# Patient Record
Sex: Female | Born: 1945
Health system: Southern US, Community
[De-identification: ages and names within clinical notes are randomized; demographics above are authoritative.]

## PROBLEM LIST (undated history)

## (undated) DIAGNOSIS — J189 Pneumonia, unspecified organism: Secondary | ICD-10-CM

## (undated) DIAGNOSIS — N12 Tubulo-interstitial nephritis, not specified as acute or chronic: Secondary | ICD-10-CM

## (undated) DIAGNOSIS — I639 Cerebral infarction, unspecified: Secondary | ICD-10-CM

## (undated) DIAGNOSIS — I69354 Hemiplegia and hemiparesis following cerebral infarction affecting left non-dominant side: Secondary | ICD-10-CM

## (undated) DIAGNOSIS — K219 Gastro-esophageal reflux disease without esophagitis: Secondary | ICD-10-CM

## (undated) DIAGNOSIS — F32A Depression, unspecified: Secondary | ICD-10-CM

## (undated) DIAGNOSIS — R569 Unspecified convulsions: Secondary | ICD-10-CM

## (undated) DIAGNOSIS — F17200 Nicotine dependence, unspecified, uncomplicated: Secondary | ICD-10-CM

## (undated) DIAGNOSIS — R519 Headache, unspecified: Secondary | ICD-10-CM

## (undated) DIAGNOSIS — Z720 Tobacco use: Secondary | ICD-10-CM

## (undated) DIAGNOSIS — E119 Type 2 diabetes mellitus without complications: Secondary | ICD-10-CM

## (undated) DIAGNOSIS — Z993 Dependence on wheelchair: Secondary | ICD-10-CM

## (undated) DIAGNOSIS — F0390 Unspecified dementia without behavioral disturbance: Secondary | ICD-10-CM

## (undated) DIAGNOSIS — H905 Unspecified sensorineural hearing loss: Secondary | ICD-10-CM

## (undated) DIAGNOSIS — N390 Urinary tract infection, site not specified: Secondary | ICD-10-CM

## (undated) DIAGNOSIS — I739 Peripheral vascular disease, unspecified: Secondary | ICD-10-CM

## (undated) DIAGNOSIS — J449 Chronic obstructive pulmonary disease, unspecified: Secondary | ICD-10-CM

## (undated) DIAGNOSIS — R918 Other nonspecific abnormal finding of lung field: Secondary | ICD-10-CM

## (undated) DIAGNOSIS — R269 Unspecified abnormalities of gait and mobility: Secondary | ICD-10-CM

## (undated) DIAGNOSIS — I10 Essential (primary) hypertension: Secondary | ICD-10-CM

## (undated) DIAGNOSIS — M1990 Unspecified osteoarthritis, unspecified site: Secondary | ICD-10-CM

## (undated) HISTORY — DX: Chronic obstructive pulmonary disease, unspecified: J44.9

## (undated) HISTORY — PX: TUBAL LIGATION: SHX77

## (undated) HISTORY — DX: Hemiplegia and hemiparesis following cerebral infarction affecting left non-dominant side: I69.354

## (undated) HISTORY — DX: Nicotine dependence, unspecified, uncomplicated: F17.200

---

## 2017-06-25 ENCOUNTER — Encounter (HOSPITAL_COMMUNITY): Payer: Self-pay | Admitting: Emergency Medicine

## 2017-06-25 ENCOUNTER — Emergency Department (HOSPITAL_COMMUNITY)
Admission: EM | Admit: 2017-06-25 | Discharge: 2017-06-25 | Disposition: A | Discharge status: Home / Self Care | Payer: Medicare Other | Attending: Emergency Medicine | Admitting: Emergency Medicine

## 2017-06-25 ENCOUNTER — Other Ambulatory Visit: Payer: Self-pay

## 2017-06-25 DIAGNOSIS — M545 Low back pain: Secondary | ICD-10-CM | POA: Diagnosis present

## 2017-06-25 DIAGNOSIS — Z5321 Procedure and treatment not carried out due to patient leaving prior to being seen by health care provider: Secondary | ICD-10-CM | POA: Diagnosis not present

## 2017-06-25 DIAGNOSIS — R350 Frequency of micturition: Secondary | ICD-10-CM | POA: Diagnosis not present

## 2017-06-25 HISTORY — DX: Unspecified osteoarthritis, unspecified site: M19.90

## 2017-06-25 HISTORY — DX: Cerebral infarction, unspecified: I63.9

## 2017-06-25 HISTORY — DX: Tubulo-interstitial nephritis, not specified as acute or chronic: N12

## 2017-06-25 HISTORY — DX: Urinary tract infection, site not specified: N39.0

## 2017-06-25 HISTORY — DX: Type 2 diabetes mellitus without complications: E11.9

## 2017-06-25 LAB — BASIC METABOLIC PANEL
ANION GAP: 9 (ref 5–15)
BUN: 13 mg/dL (ref 6–20)
CHLORIDE: 97 mmol/L — AB (ref 101–111)
CO2: 29 mmol/L (ref 22–32)
Calcium: 8.9 mg/dL (ref 8.9–10.3)
Creatinine, Ser: 0.74 mg/dL (ref 0.44–1.00)
GFR calc Af Amer: >60 mL/min (ref >60)
GLUCOSE: 337 mg/dL — AB (ref 65–99)
POTASSIUM: 3.7 mmol/L (ref 3.5–5.1)
Sodium: 135 mmol/L (ref 135–145)

## 2017-06-25 LAB — CBC
HEMATOCRIT: 45 % (ref 36.0–46.0)
HEMOGLOBIN: 14.7 g/dL (ref 12.0–15.0)
MCH: 26.2 pg (ref 26.0–34.0)
MCHC: 32.7 g/dL (ref 30.0–36.0)
MCV: 80.2 fL (ref 78.0–100.0)
Platelets: 229 10*3/uL (ref 150–400)
RBC: 5.61 MIL/uL — ABNORMAL HIGH (ref 3.87–5.11)
RDW: 16.6 % — AB (ref 11.5–15.5)
WBC: 8.8 10*3/uL (ref 4.0–10.5)

## 2017-06-25 NOTE — ED Triage Notes (Addendum)
Pt c/o low back pain, for 1 week-- denies injury-- has had same pain in past-- states was a kidney infection-- c/o urinary frequency Pt recently moved here from [place] - no local dr

## 2017-06-25 NOTE — ED Notes (Signed)
Pt taken to restroom by family member

## 2017-06-25 NOTE — ED Notes (Signed)
No answer X3 for recheck.

## 2017-06-25 NOTE — ED Notes (Signed)
No answer for vital signs recheck

## 2017-06-25 NOTE — ED Notes (Signed)
[facility] staff unable to locate pt to recheck vitals.

## 2017-07-27 DIAGNOSIS — F17200 Nicotine dependence, unspecified, uncomplicated: Secondary | ICD-10-CM

## 2017-07-27 HISTORY — DX: Nicotine dependence, unspecified, uncomplicated: F17.200

## 2017-07-29 DIAGNOSIS — G8194 Hemiplegia, unspecified affecting left nondominant side: Secondary | ICD-10-CM | POA: Insufficient documentation

## 2017-07-29 DIAGNOSIS — E785 Hyperlipidemia, unspecified: Secondary | ICD-10-CM | POA: Insufficient documentation

## 2017-07-29 DIAGNOSIS — M5430 Sciatica, unspecified side: Secondary | ICD-10-CM | POA: Insufficient documentation

## 2017-07-29 DIAGNOSIS — K219 Gastro-esophageal reflux disease without esophagitis: Secondary | ICD-10-CM | POA: Insufficient documentation

## 2017-07-29 DIAGNOSIS — M1990 Unspecified osteoarthritis, unspecified site: Secondary | ICD-10-CM | POA: Insufficient documentation

## 2017-07-29 DIAGNOSIS — E559 Vitamin D deficiency, unspecified: Secondary | ICD-10-CM | POA: Insufficient documentation

## 2017-07-29 DIAGNOSIS — J45909 Unspecified asthma, uncomplicated: Secondary | ICD-10-CM | POA: Insufficient documentation

## 2018-01-12 ENCOUNTER — Ambulatory Visit (INDEPENDENT_AMBULATORY_CARE_PROVIDER_SITE_OTHER): Payer: Medicare Other | Admitting: Pulmonary Disease

## 2018-01-12 ENCOUNTER — Ambulatory Visit (INDEPENDENT_AMBULATORY_CARE_PROVIDER_SITE_OTHER)
Admission: RE | Admit: 2018-01-12 | Discharge: 2018-01-12 | Disposition: A | Discharge status: Home / Self Care | Payer: Medicare Other | Source: Ambulatory Visit | Attending: Pulmonary Disease | Admitting: Pulmonary Disease

## 2018-01-12 ENCOUNTER — Encounter: Payer: Self-pay | Admitting: Pulmonary Disease

## 2018-01-12 VITALS — BP 118/70 | HR 76 | Ht 63.0 in | Wt 127.8 lb

## 2018-01-12 DIAGNOSIS — Z720 Tobacco use: Secondary | ICD-10-CM

## 2018-01-12 DIAGNOSIS — J449 Chronic obstructive pulmonary disease, unspecified: Secondary | ICD-10-CM | POA: Insufficient documentation

## 2018-01-12 DIAGNOSIS — J432 Centrilobular emphysema: Secondary | ICD-10-CM

## 2018-01-12 DIAGNOSIS — J439 Emphysema, unspecified: Secondary | ICD-10-CM | POA: Diagnosis not present

## 2018-01-12 MED ORDER — FLUTICASONE-UMECLIDIN-VILANT 100-62.5-25 MCG/INH IN AEPB: 1.0000 | INHALATION_SPRAY | Freq: Every day | RESPIRATORY_TRACT | 6 refills | Status: DC

## 2018-01-12 MED ORDER — ALBUTEROL SULFATE HFA 108 (90 BASE) MCG/ACT IN AERS: 2.0000 | INHALATION_SPRAY | Freq: Four times a day (QID) | RESPIRATORY_TRACT | 6 refills | Status: DC | PRN

## 2018-01-12 NOTE — Assessment & Plan Note (Signed)
Smoking cessation was  emphasized.  She did not seem enthusiastic about setting a quit date. They will call the quit line

## 2018-01-12 NOTE — Assessment & Plan Note (Signed)
Chest x-ray today. Refills on trilogy once daily  and Pro Air 2 puffs every 6 hours as needed.  Referral to pulmonary rehab. We will send in request for oxygen 2 L and portable concentrator

## 2018-01-12 NOTE — Patient Instructions (Signed)
Chest x-ray today. Refills on trilogy once daily  and Pro Air 2 puffs every 6 hours as needed.  Referral to pulmonary rehab. We will send in request for oxygen 2 L and portable concentrator

## 2018-01-12 NOTE — Progress Notes (Signed)
Subjective:    Patient ID: Crystal Moore, female    DOB: January 06, 1946, 72 y.o.   MRN: [id number]  HPI  Chief Complaint  Patient presents with  . Pulm Consult    Referred by Dr. Fara Olden for chronic resp failure. Per patient's daughter, she moved her a year ago. Had been on O2 for 10 years. Has not been on O2 for the past year due to not being able to find a DME.    72 year old smoker presents to establish care for emphysema and chronic respiratory failure. She is accompanied by her daughter Crystal Moore, they have just moved to [place] from [place] about a year ago.  She has a history of multiple strokes and residual left hemiparesis and insulin requiring diabetes.  Apparently she was on oxygen and was diagnosed with COPD many years ago.  When she moved over, she returned her oxygen tanks and has been without oxygen for about a year. Daughter reports that her when her oxygen level drops really low she becomes very quiet and somnolent.  She had silent seizures in the past last such was 7 years ago and she is not on anticonvulsants anymore. She continues to smoke about a pack per day, more than 50 pack years. Oxygen saturation was 87% on room air today and improved with oxygen to 93%.  I note that oxygen saturation on her PCP visit on 12/24/2017 was 93% on room air.  She is maintained on a regimen of Trelegy and pro-air to use as needed.  She had a nebulizer but has not needed this within the past year.  She reports a chronic cough productive of clear white sputum, denies wheezing.  She is ambulatory around the house, goes out of the house to smoke.  Uses a wheelchair when she goes for longer distances. Has some memory issues but not dementia per her daughter, daughter does not allow her to cook at home  Labs were reviewed which show uncontrolled diabetes, hemoglobin of 14.8 normal renal function  Significant tests/ events reviewed  Spirometry showed ratio 63, FEV1 of 0.95, 55% and FVC  of 68% consistent with moderate airway obstruction  Past Medical History:  Diagnosis Date  . Arthritis   . COPD (chronic obstructive pulmonary disease) ([place])   . Diabetes mellitus without complication ([place])   . Hemiparesis affecting left side as late effect of cerebrovascular accident (CVA) ([place])   . Pyelonephritis   . Stroke ([place])   . Tobacco use disorder   . UTI (urinary tract infection)      Past Surgical History:  Procedure Laterality Date  . TUBAL LIGATION      No Known Allergies   Social History   Socioeconomic History  . Marital status: Single    Spouse name: Not on file  . Number of children: Not on file  . Years of education: Not on file  . Highest education level: Not on file  Occupational History  . Not on file  Social Needs  . Financial resource strain: Not on file  . Food insecurity:    Worry: Not on file    Inability: Not on file  . Transportation needs:    Medical: Not on file    Non-medical: Not on file  Tobacco Use  . Smoking status: Current Every Day Smoker    Packs/day: 0.50    Types: Cigarettes  . Smokeless tobacco: Never Used  Substance and Sexual Activity  . Alcohol use: No    Frequency: Never  .  Drug use: No  . Sexual activity: Not on file  Lifestyle  . Physical activity:    Days per week: Not on file    Minutes per session: Not on file  . Stress: Not on file  Relationships  . Social connections:    Talks on phone: Not on file    Gets together: Not on file    Attends religious service: Not on file    Active member of club or organization: Not on file    Attends meetings of clubs or organizations: Not on file    Relationship status: Not on file  . Intimate partner violence:    Fear of current or ex partner: Not on file    Emotionally abused: Not on file    Physically abused: Not on file    Forced sexual activity: Not on file  Other Topics Concern  . Not on file  Social History Narrative  . Not on file    Her parents are  deceased in their 50s, Crystal Moore daughter has hypertension and son has diabetes      Review of Systems    Constitutional: negative for anorexia, fevers and sweats  Eyes: negative for irritation, redness and visual disturbance  Ears, nose, mouth, throat, and face: negative for earaches, epistaxis, nasal congestion and sore throat   Cardiovascular: negative for chest pain, lower extremity edema, orthopnea, palpitations and syncope  Gastrointestinal: negative for abdominal pain, constipation, diarrhea, melena, nausea and vomiting  Genitourinary:negative for dysuria, frequency and hematuria  Hematologic/lymphatic: negative for bleeding, easy bruising and lymphadenopathy  Musculoskeletal:negative for arthralgias, muscle weakness and stiff joints  Neurological: negative for headaches and weakness  Endocrine: negative for diabetic symptoms including polydipsia, polyuria and weight loss     Objective:   Physical Exam  Gen. Pleasant, thin, in no distress, normal affect ENT - no lesions, no post nasal drip Neck: No JVD, no thyromegaly, no carotid bruits Lungs: no use of accessory muscles, no dullness to percussion, decreased without rales or rhonchi  Cardiovascular: Rhythm regular, heart sounds  normal, no murmurs or gallops, no peripheral edema Abdomen: soft and non-tender, no hepatosplenomegaly, BS normal. Musculoskeletal: No deformities, no cyanosis or clubbing Neuro:  alert, left hemiparesis       Assessment & Plan:

## 2018-01-13 ENCOUNTER — Telehealth (HOSPITAL_COMMUNITY): Payer: Self-pay | Admitting: *Deleted

## 2018-01-13 ENCOUNTER — Telehealth: Payer: Self-pay | Admitting: Pulmonary Disease

## 2018-01-13 DIAGNOSIS — J432 Centrilobular emphysema: Secondary | ICD-10-CM

## 2018-01-13 DIAGNOSIS — H9190 Unspecified hearing loss, unspecified ear: Secondary | ICD-10-CM

## 2018-01-13 HISTORY — DX: Unspecified hearing loss, unspecified ear: H91.90

## 2018-01-13 NOTE — Telephone Encounter (Signed)
Called and spoke to [place] with [facility].  Corene Cornea stated that per [facility] policy for medicare, insurance has to pay 13 our less payments to current DME company in order to switch DME companies. Per Huey Bienenstock has paid 18 payments to [employer] home [facility]. Corene Cornea stated that he is currently working with upper management to see if pt can be taken on. Corene Cornea stated he will call back with updates.    Will send to Dr. Elsworth Soho, as an Juluis Rainier.

## 2018-01-14 ENCOUNTER — Other Ambulatory Visit: Payer: Self-pay | Admitting: Pulmonary Disease

## 2018-01-14 DIAGNOSIS — J439 Emphysema, unspecified: Secondary | ICD-10-CM

## 2018-01-14 NOTE — Telephone Encounter (Signed)
Placed a new order stating to change pt's DME from [facility] to a different one.  Nothing further needed.

## 2018-01-14 NOTE — Telephone Encounter (Signed)
Please send to different DME

## 2018-01-20 ENCOUNTER — Telehealth (HOSPITAL_COMMUNITY): Payer: Self-pay

## 2018-01-20 NOTE — Telephone Encounter (Signed)
Attempted to call daughter of patient in regards to Pulmonary Rehab - Could not leave voicemail.

## 2018-01-21 ENCOUNTER — Telehealth: Payer: Self-pay | Admitting: Pulmonary Disease

## 2018-01-21 DIAGNOSIS — J439 Emphysema, unspecified: Secondary | ICD-10-CM

## 2018-01-21 NOTE — Telephone Encounter (Signed)
Crystal Moore [facility] requesting O2 order be placed again without a DME specified Order placed Nothing further needed; will sign off

## 2018-01-24 ENCOUNTER — Inpatient Hospital Stay: Admission: RE | Admit: 2018-01-24 | Payer: Medicare Other | Source: Ambulatory Visit

## 2018-02-07 ENCOUNTER — Inpatient Hospital Stay: Admission: RE | Admit: 2018-02-07 | Payer: Medicare Other | Source: Ambulatory Visit

## 2018-02-09 ENCOUNTER — Ambulatory Visit: Payer: Medicare Other | Admitting: *Deleted

## 2018-02-11 ENCOUNTER — Ambulatory Visit: Payer: Medicare Other | Admitting: Adult Health

## 2018-02-15 ENCOUNTER — Ambulatory Visit (INDEPENDENT_AMBULATORY_CARE_PROVIDER_SITE_OTHER)
Admission: RE | Admit: 2018-02-15 | Discharge: 2018-02-15 | Disposition: A | Discharge status: Home / Self Care | Payer: Medicare Other | Source: Ambulatory Visit | Attending: Pulmonary Disease | Admitting: Pulmonary Disease

## 2018-02-15 DIAGNOSIS — J439 Emphysema, unspecified: Secondary | ICD-10-CM | POA: Diagnosis not present

## 2018-02-18 ENCOUNTER — Encounter: Payer: Self-pay | Admitting: Pulmonary Disease

## 2018-02-18 ENCOUNTER — Ambulatory Visit (INDEPENDENT_AMBULATORY_CARE_PROVIDER_SITE_OTHER): Payer: Medicare Other | Admitting: Pulmonary Disease

## 2018-02-18 DIAGNOSIS — J9611 Chronic respiratory failure with hypoxia: Secondary | ICD-10-CM

## 2018-02-18 DIAGNOSIS — J432 Centrilobular emphysema: Secondary | ICD-10-CM | POA: Diagnosis not present

## 2018-02-18 DIAGNOSIS — Z720 Tobacco use: Secondary | ICD-10-CM

## 2018-02-18 DIAGNOSIS — J849 Interstitial pulmonary disease, unspecified: Secondary | ICD-10-CM | POA: Diagnosis not present

## 2018-02-18 MED ORDER — NICOTINE 10 MG IN INHA: 1.0000 | RESPIRATORY_TRACT | 0 refills | Status: DC | PRN

## 2018-02-18 NOTE — Progress Notes (Signed)
   Subjective:    Patient ID: Crystal Moore, female    DOB: 04-20-1946, 72 y.o.   MRN: [id number]  HPI  72 year old smoker  for FU of  emphysema and chronic respiratory failure.  She has a history of multiple strokes and residual left hemiparesis and uncontrolled insulin requiring diabetes.  h/o seizure disorder last such was 7 years ago and she is not on anticonvulsants anymore. She continues to smoke about a pack per day, more than 50 pack years.  Accompanied by grandson today, she uses oxygen on and off at home, does not have portable oxygen.  She continues to smoke, outside the house and grandson is worried about oxygen and smoking.  She also has some sputum production which she swallows mostly.  Significant tests/ events reviewed  12/2017 Spirometry showed ratio 63, FEV1 of 0.95, 55% and FVC of 68% consistent with moderate airway obstruction  01/12/18  87% RA and improved with oxygen to 93%.  CT chest 01/2018 >> fibrotic NSIP ,less likely UIP , mild emphysema   Past Medical History:  Diagnosis Date  . Arthritis   . COPD (chronic obstructive pulmonary disease) ([place])   . Diabetes mellitus without complication ([place])   . Hemiparesis affecting left side as late effect of cerebrovascular accident (CVA) ([place])   . Pyelonephritis   . Stroke ([place])   . Tobacco use disorder   . UTI (urinary tract infection)     Review of Systems neg for any significant sore throat, dysphagia, itching, sneezing, nasal congestion or excess/ purulent secretions, fever, chills, sweats, unintended wt loss, pleuritic or exertional cp, hempoptysis, orthopnea pnd or change in chronic leg swelling. Also denies presyncope, palpitations, heartburn, abdominal pain, nausea, vomiting, diarrhea or change in bowel or urinary habits, dysuria,hematuria, rash, arthralgias, visual complaints, headache, numbness weakness or ataxia.     Objective:   Physical Exam   Gen. Pleasant, elderly, well-nourished, in no  distress ENT - no thrush, no post nasal drip, edentulous Neck: No JVD, no thyromegaly, no carotid bruits Lungs: no use of accessory muscles, no dullness to percussion, bibasal rales no rhonchi  Cardiovascular: Rhythm regular, heart sounds  normal, no murmurs or gallops, no peripheral edema Musculoskeletal: No deformities, no cyanosis or clubbing         Assessment & Plan:

## 2018-02-18 NOTE — Assessment & Plan Note (Signed)
Ct trelegy Use Mucinex 1 tablet daily as needed for phlegm

## 2018-02-18 NOTE — Addendum Note (Signed)
Addended by: Valerie Salts on: 02/18/2018 04:12 PM   Modules accepted: Orders

## 2018-02-18 NOTE — Patient Instructions (Signed)
You have emphysema and scar tissue buildup in your lungs/fibrosis  Main message today is that you have to quit smoking! We will provide you prescription for Nicotrol inhaler to use once daily to help cut down on cigarettes #20  Use Mucinex 1 tablet daily as needed for phlegm

## 2018-02-18 NOTE — Assessment & Plan Note (Signed)
have to quit smoking! We will provide you prescription for Nicotrol inhaler to use once daily to help cut down on cigarettes #20

## 2018-02-18 NOTE — Assessment & Plan Note (Signed)
Doubt that she is a candidate for anti-fibrotic therapies Given smoking cessation will be the most important intervention at this time

## 2018-02-18 NOTE — Assessment & Plan Note (Signed)
Continue oxygen concentrator at home. But avoid portable oxygen until she is able to quit smoking

## 2018-03-16 ENCOUNTER — Encounter: Payer: Self-pay | Admitting: Endocrinology

## 2018-03-16 ENCOUNTER — Ambulatory Visit (INDEPENDENT_AMBULATORY_CARE_PROVIDER_SITE_OTHER): Payer: Medicare Other | Admitting: Endocrinology

## 2018-03-16 VITALS — BP 178/98 | HR 87 | Temp 97.9°F | Ht 63.0 in | Wt 133.0 lb

## 2018-03-16 DIAGNOSIS — E119 Type 2 diabetes mellitus without complications: Secondary | ICD-10-CM | POA: Insufficient documentation

## 2018-03-16 DIAGNOSIS — Z794 Long term (current) use of insulin: Secondary | ICD-10-CM

## 2018-03-16 DIAGNOSIS — E1159 Type 2 diabetes mellitus with other circulatory complications: Secondary | ICD-10-CM

## 2018-03-16 LAB — POCT GLYCOSYLATED HEMOGLOBIN (HGB A1C): Hemoglobin A1C: 11 % — AB (ref 4.0–5.6)

## 2018-03-16 MED ORDER — INSULIN DEGLUDEC 100 UNIT/ML ~~LOC~~ SOPN: 50.0000 [IU] | PEN_INJECTOR | Freq: Every day | SUBCUTANEOUS | 11 refills | Status: DC

## 2018-03-16 NOTE — Progress Notes (Signed)
   Subjective:    Patient ID: Crystal Moore, female    DOB: [birth year], 72 y.o.   MRN: [id number]  HPI    Review of Systems     Objective:   Physical Exam        Assessment & Plan:

## 2018-03-16 NOTE — Progress Notes (Signed)
Subjective:    Patient ID: Crystal Moore, female    DOB: Jun 08, 1946, 72 y.o.   MRN: [id number]  HPI pt is referred by Crystal Moore, for diabetes.  Pt states DM was dx'ed in 2002; she has mild if any neuropathy of the lower extremities, but he has associated CVA's; she has been on insulin since soon after dx; pt says her diet is poor, but exercise is limited by health problems; she has never had GDM, pancreatitis, pancreatic surgery, severe hypoglycemia or DKA.  dtr gives pt her insulin, but this is sometimes misses, due to schedule.  History is from grandson (Crystal Moore), due to pt's memory loss.  Pt has a meter and strips.  Past Medical History:  Diagnosis Date  . Arthritis   . COPD (chronic obstructive pulmonary disease) ([place])   . Diabetes mellitus without complication ([place])   . Hemiparesis affecting left side as late effect of cerebrovascular accident (CVA) ([place])   . Pyelonephritis   . Stroke ([place])   . Tobacco use disorder   . UTI (urinary tract infection)     Past Surgical History:  Procedure Laterality Date  . TUBAL LIGATION      Social History   Socioeconomic History  . Marital status: Single    Spouse name: Not on file  . Number of children: Not on file  . Years of education: Not on file  . Highest education level: Not on file  Occupational History  . Not on file  Social Needs  . Financial resource strain: Not on file  . Food insecurity:    Worry: Not on file    Inability: Not on file  . Transportation needs:    Medical: Not on file    Non-medical: Not on file  Tobacco Use  . Smoking status: Current Every Day Smoker    Packs/day: 0.50    Types: Cigarettes  . Smokeless tobacco: Never Used  Substance and Sexual Activity  . Alcohol use: No    Frequency: Never  . Drug use: No  . Sexual activity: Not on file  Lifestyle  . Physical activity:    Days per week: Not on file    Minutes per session: Not on file  . Stress: Not on file  Relationships  . Social  connections:    Talks on phone: Not on file    Gets together: Not on file    Attends religious service: Not on file    Active member of club or organization: Not on file    Attends meetings of clubs or organizations: Not on file    Relationship status: Not on file  . Intimate partner violence:    Fear of current or ex partner: Not on file    Emotionally abused: Not on file    Physically abused: Not on file    Forced sexual activity: Not on file  Other Topics Concern  . Not on file  Social History Narrative  . Not on file    Current Outpatient Medications on File Prior to Visit  Medication Sig Dispense Refill  . albuterol (PROVENTIL HFA;VENTOLIN HFA) 108 (90 Base) MCG/ACT inhaler Inhale 2 puffs into the lungs every 6 (six) hours as needed for wheezing or shortness of breath. 1 Inhaler 6  . amLODipine-benazepril (LOTREL) 5-20 MG capsule Take 1 capsule by mouth daily.    Marland Kitchen aspirin EC 81 MG tablet Take 81 mg by mouth daily.    . Fluticasone-Umeclidin-Vilant (TRELEGY ELLIPTA) 100-62.5-25 MCG/INH AEPB Inhale 1  puff into the lungs daily. 1 each 6  . glipiZIDE (GLUCOTROL) 5 MG tablet Take by mouth daily before breakfast.    . metFORMIN (GLUCOPHAGE) 500 MG tablet Take by mouth 2 (two) times daily with a meal.    . naproxen sodium (ALEVE) 220 MG tablet Take 220 mg by mouth as needed.    . nicotine (NICOTROL) 10 MG inhaler Inhale 1 Cartridge (1 continuous puffing total) into the lungs as needed for smoking cessation. 30 each 0  . PARoxetine (PAXIL) 10 MG tablet Take 10 mg by mouth daily.     No current facility-administered medications on file prior to visit.     No Known Allergies  Family History  Problem Relation Age of Onset  . Diabetes Mother   . Diabetes Sister   . Diabetes Brother     BP (!) 178/98 (BP Location: Right Arm, Patient Position: Sitting, Cuff Size: Normal)   Pulse 87   Temp 97.9 F (36.6 C) (Oral)   Ht 5\' 3"  (1.6 m)   Wt 133 lb (60.3 kg)   SpO2 93%   BMI 23.56  kg/m    Review of Systems denies blurry vision, headache, chest pain, sob, n/v, muscle cramps, excessive diaphoresis, depression, hypoglycemia, cold intolerance, rhinorrhea, and easy bruising.  Pt has lost weight.  She has urinary frequency and memory loss.      Objective:   Physical Exam VS: see vs page GEN: no distress.  In wheelchair HEAD: head: no deformity eyes: no periorbital swelling, no proptosis external nose and ears are normal mouth: no lesion seen NECK: supple, thyroid is not enlarged CHEST WALL: no deformity LUNGS: clear to auscultation CV: reg rate and rhythm, no murmur ABD: abdomen is soft, nontender.  no hepatosplenomegaly.  not distended.  no hernia MUSCULOSKELETAL: muscle bulk and strength are grossly normal, except severely decreased on the LUE and LLE.  no obvious joint swelling.  gait is not tested.  EXTEMITIES: no deformity.  no ulcer on the feet.  feet are of normal color and temp.  no edema PULSES: dorsalis pedis intact bilat.  no carotid bruit NEURO:  cn 2-12 grossly intact.   readily moves all 4's.  sensation is intact to touch on the feet SKIN:  Normal texture and temperature.  No rash or suspicious lesion is visible.   NODES:  None palpable at the neck PSYCH: alert, but oriented to self and place only.  Does not appear anxious nor depressed.  Lab Results  Component Value Date   CREATININE 0.74 06/25/2017   BUN 13 06/25/2017   NA 135 06/25/2017   K 3.7 06/25/2017   CL 97 (L) 06/25/2017   CO2 29 06/25/2017    A1c=11.0%  I have reviewed outside records, and summarized: Pt was noted to have elevated a1c, and referred here.  She was also seen by pulm, and was advised to d/c smoking.      Assessment & Plan:  Insulin-requiring type 2 DM, with CVA's: severe exacerbation.  Memory loss: she is not a candidate for multiple daily injections.  In fact, she needs a qd insulin she can make up if she misses a dose.  COPD: this severely limits exercise rx.     Patient Instructions  good diet and exercise significantly improve the control of your diabetes.  please let me know if you wish to be referred to a dietician.  high blood sugar is very risky to your health.  you should see an eye doctor and dentist  every year.  It is very important to get all recommended vaccinations.  Controlling your blood pressure and cholesterol drastically reduces the damage diabetes does to your body.  Those who smoke should quit.  Please discuss these with your doctor.  check your blood sugar twice a day.  vary the time of day when you check, between before the 3 meals, and at bedtime.  also check if you have symptoms of your blood sugar being too high or too low.  please keep a record of the readings and bring it to your next appointment here (or you can bring the meter itself).  You can write it on any piece of paper.  please call us sooner if your blood sugar goes below 70, or if you have a lot of readings over 200. We will need to take this complex situation in stages For now, please: Change the insulin to "tresiba," 50 units daily.  If you miss the shot, take as soon as you remember.   Please continue the same other diabetes medications, but we'll plan to phase those out.  Please come back for a follow-up appointment in 2 weeks.

## 2018-03-16 NOTE — Patient Instructions (Signed)
good diet and exercise significantly improve the control of your diabetes.  please let me know if you wish to be referred to a dietician.  high blood sugar is very risky to your health.  you should see an eye doctor and dentist every year.  It is very important to get all recommended vaccinations.  Controlling your blood pressure and cholesterol drastically reduces the damage diabetes does to your body.  Those who smoke should quit.  Please discuss these with your doctor.  check your blood sugar twice a day.  vary the time of day when you check, between before the 3 meals, and at bedtime.  also check if you have symptoms of your blood sugar being too high or too low.  please keep a record of the readings and bring it to your next appointment here (or you can bring the meter itself).  You can write it on any piece of paper.  please call us sooner if your blood sugar goes below 70, or if you have a lot of readings over 200. We will need to take this complex situation in stages For now, please: Change the insulin to "tresiba," 50 units daily.  If you miss the shot, take as soon as you remember.   Please continue the same other diabetes medications, but we'll plan to phase those out.  Please come back for a follow-up appointment in 2 weeks.

## 2018-04-06 ENCOUNTER — Ambulatory Visit: Payer: Medicare Other | Admitting: Endocrinology

## 2018-04-06 DIAGNOSIS — Z0289 Encounter for other administrative examinations: Secondary | ICD-10-CM

## 2018-05-28 ENCOUNTER — Inpatient Hospital Stay (HOSPITAL_COMMUNITY)
Admission: EM | Admit: 2018-05-28 | Discharge: 2018-06-01 | DRG: 871 | Disposition: A | Discharge status: Home / Self Care | Payer: Medicare Other | Attending: Internal Medicine | Admitting: Internal Medicine

## 2018-05-28 ENCOUNTER — Emergency Department (HOSPITAL_COMMUNITY): Payer: Medicare Other

## 2018-05-28 DIAGNOSIS — Z79899 Other long term (current) drug therapy: Secondary | ICD-10-CM

## 2018-05-28 DIAGNOSIS — R159 Full incontinence of feces: Secondary | ICD-10-CM | POA: Diagnosis present

## 2018-05-28 DIAGNOSIS — I10 Essential (primary) hypertension: Secondary | ICD-10-CM

## 2018-05-28 DIAGNOSIS — I69354 Hemiplegia and hemiparesis following cerebral infarction affecting left non-dominant side: Secondary | ICD-10-CM

## 2018-05-28 DIAGNOSIS — K529 Noninfective gastroenteritis and colitis, unspecified: Secondary | ICD-10-CM

## 2018-05-28 DIAGNOSIS — Z7951 Long term (current) use of inhaled steroids: Secondary | ICD-10-CM

## 2018-05-28 DIAGNOSIS — A419 Sepsis, unspecified organism: Secondary | ICD-10-CM | POA: Diagnosis not present

## 2018-05-28 DIAGNOSIS — Z9981 Dependence on supplemental oxygen: Secondary | ICD-10-CM

## 2018-05-28 DIAGNOSIS — J9611 Chronic respiratory failure with hypoxia: Secondary | ICD-10-CM | POA: Diagnosis present

## 2018-05-28 DIAGNOSIS — I959 Hypotension, unspecified: Secondary | ICD-10-CM

## 2018-05-28 DIAGNOSIS — W1839XA Other fall on same level, initial encounter: Secondary | ICD-10-CM | POA: Diagnosis present

## 2018-05-28 DIAGNOSIS — M1990 Unspecified osteoarthritis, unspecified site: Secondary | ICD-10-CM | POA: Diagnosis present

## 2018-05-28 DIAGNOSIS — I7389 Other specified peripheral vascular diseases: Secondary | ICD-10-CM | POA: Diagnosis present

## 2018-05-28 DIAGNOSIS — I672 Cerebral atherosclerosis: Secondary | ICD-10-CM | POA: Diagnosis present

## 2018-05-28 DIAGNOSIS — R32 Unspecified urinary incontinence: Secondary | ICD-10-CM | POA: Diagnosis present

## 2018-05-28 DIAGNOSIS — G9340 Encephalopathy, unspecified: Secondary | ICD-10-CM

## 2018-05-28 DIAGNOSIS — K559 Vascular disorder of intestine, unspecified: Secondary | ICD-10-CM

## 2018-05-28 DIAGNOSIS — Z7982 Long term (current) use of aspirin: Secondary | ICD-10-CM

## 2018-05-28 DIAGNOSIS — F329 Major depressive disorder, single episode, unspecified: Secondary | ICD-10-CM | POA: Diagnosis present

## 2018-05-28 DIAGNOSIS — R9431 Abnormal electrocardiogram [ECG] [EKG]: Secondary | ICD-10-CM | POA: Diagnosis present

## 2018-05-28 DIAGNOSIS — F32A Depression, unspecified: Secondary | ICD-10-CM

## 2018-05-28 DIAGNOSIS — E876 Hypokalemia: Secondary | ICD-10-CM | POA: Diagnosis present

## 2018-05-28 DIAGNOSIS — G40909 Epilepsy, unspecified, not intractable, without status epilepticus: Secondary | ICD-10-CM | POA: Diagnosis present

## 2018-05-28 DIAGNOSIS — K921 Melena: Secondary | ICD-10-CM

## 2018-05-28 DIAGNOSIS — Z833 Family history of diabetes mellitus: Secondary | ICD-10-CM

## 2018-05-28 DIAGNOSIS — F17210 Nicotine dependence, cigarettes, uncomplicated: Secondary | ICD-10-CM | POA: Diagnosis present

## 2018-05-28 DIAGNOSIS — Z87440 Personal history of urinary (tract) infections: Secondary | ICD-10-CM

## 2018-05-28 DIAGNOSIS — G9349 Other encephalopathy: Secondary | ICD-10-CM | POA: Diagnosis present

## 2018-05-28 DIAGNOSIS — J449 Chronic obstructive pulmonary disease, unspecified: Secondary | ICD-10-CM

## 2018-05-28 DIAGNOSIS — J440 Chronic obstructive pulmonary disease with acute lower respiratory infection: Secondary | ICD-10-CM | POA: Diagnosis present

## 2018-05-28 DIAGNOSIS — Z6827 Body mass index (BMI) 27.0-27.9, adult: Secondary | ICD-10-CM

## 2018-05-28 DIAGNOSIS — R6520 Severe sepsis without septic shock: Secondary | ICD-10-CM | POA: Diagnosis present

## 2018-05-28 DIAGNOSIS — E119 Type 2 diabetes mellitus without complications: Secondary | ICD-10-CM

## 2018-05-28 DIAGNOSIS — E11649 Type 2 diabetes mellitus with hypoglycemia without coma: Secondary | ICD-10-CM | POA: Diagnosis present

## 2018-05-28 DIAGNOSIS — E440 Moderate protein-calorie malnutrition: Secondary | ICD-10-CM | POA: Diagnosis present

## 2018-05-28 DIAGNOSIS — Z794 Long term (current) use of insulin: Secondary | ICD-10-CM

## 2018-05-28 DIAGNOSIS — R414 Neurologic neglect syndrome: Secondary | ICD-10-CM | POA: Diagnosis present

## 2018-05-28 DIAGNOSIS — J189 Pneumonia, unspecified organism: Secondary | ICD-10-CM | POA: Diagnosis present

## 2018-05-28 DIAGNOSIS — Z23 Encounter for immunization: Secondary | ICD-10-CM

## 2018-05-28 HISTORY — DX: Unspecified convulsions: R56.9

## 2018-05-28 HISTORY — DX: Tobacco use: Z72.0

## 2018-05-28 HISTORY — DX: Cerebral infarction, unspecified: I63.9

## 2018-05-28 HISTORY — DX: Type 2 diabetes mellitus without complications: E11.9

## 2018-05-28 LAB — CBC
HEMATOCRIT: 46.3 % — AB (ref 36.0–46.0)
HEMOGLOBIN: 14.2 g/dL (ref 12.0–15.0)
MCH: 25.8 pg — ABNORMAL LOW (ref 26.0–34.0)
MCHC: 30.7 g/dL (ref 30.0–36.0)
MCV: 84 fL (ref 80.0–100.0)
Platelets: 189 10*3/uL (ref 150–400)
RBC: 5.51 MIL/uL — AB (ref 3.87–5.11)
RDW: 14.6 % (ref 11.5–15.5)
WBC: 18.2 10*3/uL — ABNORMAL HIGH (ref 4.0–10.5)
nRBC: 0 % (ref 0.0–0.2)

## 2018-05-28 LAB — I-STAT CHEM 8, ED
BUN: 12 mg/dL (ref 8–23)
CHLORIDE: 104 mmol/L (ref 98–111)
Calcium, Ion: 1.16 mmol/L (ref 1.15–1.40)
Creatinine, Ser: 0.7 mg/dL (ref 0.44–1.00)
Glucose, Bld: 278 mg/dL — ABNORMAL HIGH (ref 70–99)
HCT: 47 % — ABNORMAL HIGH (ref 36.0–46.0)
Hemoglobin: 16 g/dL — ABNORMAL HIGH (ref 12.0–15.0)
Potassium: 3.8 mmol/L (ref 3.5–5.1)
SODIUM: 140 mmol/L (ref 135–145)
TCO2: 23 mmol/L (ref 22–32)

## 2018-05-28 LAB — DIFFERENTIAL
ABS IMMATURE GRANULOCYTES: 0.11 10*3/uL — AB (ref 0.00–0.07)
BASOS PCT: 0 %
Basophils Absolute: 0.1 10*3/uL (ref 0.0–0.1)
Eosinophils Absolute: 0.2 10*3/uL (ref 0.0–0.5)
Eosinophils Relative: 1 %
Immature Granulocytes: 1 %
Lymphocytes Relative: 32 %
Lymphs Abs: 5.9 10*3/uL — ABNORMAL HIGH (ref 0.7–4.0)
MONOS PCT: 3 %
Monocytes Absolute: 0.5 10*3/uL (ref 0.1–1.0)
NEUTROS ABS: 11.4 10*3/uL — AB (ref 1.7–7.7)
NEUTROS PCT: 63 %

## 2018-05-28 LAB — PROTIME-INR
INR: 1.22
Prothrombin Time: 15.3 seconds — ABNORMAL HIGH (ref 11.4–15.2)

## 2018-05-28 LAB — CBG MONITORING, ED: GLUCOSE-CAPILLARY: 242 mg/dL — AB (ref 70–99)

## 2018-05-28 LAB — I-STAT TROPONIN, ED: TROPONIN I, POC: 0.01 ng/mL (ref 0.00–0.08)

## 2018-05-28 LAB — APTT: APTT: 23 s — AB (ref 24–36)

## 2018-05-28 LAB — POC OCCULT BLOOD, ED: Fecal Occult Bld: POSITIVE — AB

## 2018-05-28 MED ORDER — IOHEXOL 300 MG/ML  SOLN
100.0000 mL | Freq: Once | INTRAMUSCULAR | Status: AC | PRN
  Administered 2018-05-28: 100 mL via INTRAVENOUS

## 2018-05-28 MED ORDER — ONDANSETRON HCL 4 MG/2ML IJ SOLN
4.0000 mg | Freq: Once | INTRAMUSCULAR | Status: AC
  Administered 2018-05-28: 4 mg via INTRAVENOUS
  Filled 2018-05-28: qty 2

## 2018-05-28 MED ORDER — PIPERACILLIN-TAZOBACTAM 3.375 G IVPB 30 MIN
3.3750 g | Freq: Once | INTRAVENOUS | Status: AC
  Administered 2018-05-29: 3.375 g via INTRAVENOUS
  Filled 2018-05-28: qty 50

## 2018-05-28 MED ORDER — LORAZEPAM 2 MG/ML IJ SOLN
INTRAMUSCULAR | Status: AC
  Filled 2018-05-28: qty 1

## 2018-05-28 MED ORDER — IOPAMIDOL (ISOVUE-370) INJECTION 76%
50.0000 mL | Freq: Once | INTRAVENOUS | Status: AC | PRN
  Administered 2018-05-28: 50 mL via INTRAVENOUS

## 2018-05-28 MED ORDER — SODIUM CHLORIDE 0.9 % IV BOLUS
1000.0000 mL | Freq: Once | INTRAVENOUS | Status: AC
  Administered 2018-05-28: 1000 mL via INTRAVENOUS

## 2018-05-28 NOTE — ED Notes (Signed)
Family at bedside. 

## 2018-05-28 NOTE — H&P (Addendum)
History and Physical    Crystal Moore DUK:[id number] DOB: Feb 08, 1946 DOA: 05/28/2018  PCP: System, Pcp Not In Patient coming from: [place]  Chief Complaint: AMS  HPI: Crystal Moore is a 72 y.o. female with medical history significant of hypertension, type 2 diabetes, seizure, previous stroke with residual left-sided deficits presenting to the hospital for evaluation of left-sided flaccidity and altered mental status.  Code stroke was initially called in the ED but was later canceled. Per EMS patient was at [place].  She became unresponsive they sat her down, she started vomiting, was incontinent of stool and urine. In route EMS noticed that her left side was flaccid. Found to be hypotensive with blood pressure in the 50s/ 30s by EMS.  Placed on nonrebreather by EMS (unclear what her oxygen saturation was prior to arrival).   Patient awake and alert but appeared slightly confused.  She denied having any fevers, chills, chest pain, or shortness of breath.  Reported having dizziness and generalized sharp abdominal pain today.  Daughter over the phone states that the patient is  AAOx3 at baseline. Daughter was with the patient at [facility] today when all of a sudden patient felt weak and fell down.  She did not hit her head or sustain any injuries from the fall.  Another customer brought her chair and they made her sit down on it.  It was then noted that the patient's "eyes were dilated" and she was staring in space.  The entire episode lasted 20 minutes and patient was confused at that time.  Daughter noticed urinary incontinence but stated that patient does have chronic urinary incontinence.  She uses 2 L home oxygen at all times.  Daughter states that the patient takes Aleve every day.  ED Course: Afebrile.  Slightly tachycardic. CBG 242.  White count 18.2.  Hemoglobin 14.2 on CBC and 16.0 on i-STAT chemistry.  FOBT positive.  I-STAT troponin negative.  EKG with atrial flutter (heart rate 111) but poor  quality study with baseline wander in most leads.  CT head showing chronic ischemic microangiopathy, atrophy, and multiple old infarcts; negative for acute findings.  CTA head and neck showing 70% stenosis of proximal right internal carotid artery, 25% stenosis of left internal carotid origin, occlusion of nondominant left vertebral artery proximally with reconstitution (indeterminate age), diffuse intracranial atherosclerotic disease.  CTA negative for emergent large vessel occlusion.  CT abdomen pelvis with contrast showing mild opacity at the left lung base which could represent atelectasis or subtle infiltrate.  There is also wall thickening of the distal transverse colon through the descending colon with significant adjacent fat stranding concerning for colitis.  Patient was seen by neurology (Dr. Leonel Ramsay) and his recommendation is not to start antiepileptics unless she has further events that was suspected to be seizure.  He is recommending no further imaging unless her neurologic status is not at baseline per her daughter tomorrow.  If she reports a change, then an MRI could be obtained.  Patient was started on Zosyn in the ED and received 1 L bolus of normal saline. TRH paged to admit. Blood pressure currently normal after fluid resuscitation.  Review of Systems: As per HPI otherwise 10 point review of systems negative.  Past Medical History:  Diagnosis Date  . Arthritis   . COPD (chronic obstructive pulmonary disease) ([place])   . CVA (cerebral vascular accident) ([place])   . Diabetes ([place])   . Hemiparesis affecting left side as late effect of cerebrovascular accident (CVA) ([place])   .  Pyelonephritis   . Seizures ([place])   . Tobacco use   . UTI (urinary tract infection)     Past Surgical History:  Procedure Laterality Date  . TUBAL LIGATION       reports that she has been smoking cigarettes. She has a 40.00 pack-year smoking history. She does not have any smokeless tobacco history on file.  She reports that she does not drink alcohol or use drugs.  No Known Allergies  Family History  Problem Relation Age of Onset  . Diabetes Mother   . Diabetes Sister   . Diabetes Brother     Prior to Admission medications   Medication Sig Start Date End Date Taking? Authorizing Provider  amLODipine-benazepril (LOTREL) 5-20 MG capsule Take 1 capsule by mouth daily.   Yes [provider]  aspirin EC 81 MG tablet Take 81 mg by mouth daily.   Yes [provider]  Fluticasone-Umeclidin-Vilant (TRELEGY ELLIPTA) 100-62.5-25 MCG/INH AEPB Inhale 1 puff into the lungs daily.   Yes [provider]  glipiZIDE (GLUCOTROL XL) 5 MG 24 hr tablet Take 5 mg by mouth 2 (two) times daily.   Yes [provider]  Insulin Glargine (LANTUS SOLOSTAR) 100 UNIT/ML Solostar Pen Inject 50 Units into the skin daily before breakfast.   Yes [provider]  metFORMIN (GLUCOPHAGE) 500 MG tablet Take 500 mg by mouth 2 (two) times daily.   Yes [provider]  naproxen sodium (ALEVE) 220 MG tablet Take 220 mg by mouth 2 (two) times daily as needed (for pain).   Yes [provider]  PARoxetine (PAXIL) 10 MG tablet Take 10 mg by mouth daily.   Yes [provider]    Physical Exam: Vitals:   05/28/18 2300 05/28/18 2315 05/28/18 2330 05/28/18 2345  BP: (!) 147/64 (!) 128/55 (!) 155/61 (!) 145/60  Pulse: 87 88 87 87  Resp:      Temp:      TempSrc:      SpO2: 97% 97% 98% 97%  Weight:       Physical Exam  Constitutional: No distress.  Resting comfortably in a hospital stretcher  HENT:  Mouth/Throat: Oropharynx is clear and moist.  Eyes: EOM are normal. Right eye exhibits no discharge. Left eye exhibits no discharge.  Neck: Neck supple. No tracheal deviation present.  Cardiovascular: Normal rate, regular rhythm and intact distal pulses.  Pulmonary/Chest: Effort normal. No respiratory distress. She has no wheezes.  Mild rales appreciated at the  left lung base.  Breathing comfortably on 1.5 L oxygen via nasal cannula.  No increased work of breathing.  Abdominal: Soft. Bowel sounds are normal. She exhibits no distension. There is tenderness. There is no rebound and no guarding.  Generalized tenderness to palpation  Musculoskeletal: She exhibits no edema.  Neurological:  Awake, alert, oriented to person and place Tongue midline No facial droop Strength 5 out of 5 in right upper and lower extremities Strength 3 out of 5 in left upper and lower extremities  Skin: Skin is warm and dry. She is not diaphoretic.      Labs on Admission: I have personally reviewed following labs and imaging studies  CBC: Recent Labs  Lab 05/28/18 1905 05/28/18 1909  WBC 18.2*  --   NEUTROABS 11.4*  --   HGB 14.2 16.0*  HCT 46.3* 47.0*  MCV 84.0  --   PLT 189  --    Basic Metabolic Panel: Recent Labs  Lab 05/28/18 1909  NA 140  K 3.8  CL 104  GLUCOSE 278*  BUN 12  CREATININE 0.70   GFR: CrCl cannot be calculated (Unknown ideal weight.). Liver Function Tests: No results for input(s): AST, ALT, ALKPHOS, BILITOT, PROT, ALBUMIN in the last 168 hours. No results for input(s): LIPASE, AMYLASE in the last 168 hours. No results for input(s): AMMONIA in the last 168 hours. Coagulation Profile: Recent Labs  Lab 05/28/18 1905  INR 1.22   Cardiac Enzymes: No results for input(s): CKTOTAL, CKMB, CKMBINDEX, TROPONINI in the last 168 hours. BNP (last 3 results) No results for input(s): PROBNP in the last 8760 hours. HbA1C: No results for input(s): HGBA1C in the last 72 hours. CBG: Recent Labs  Lab 05/28/18 1819  GLUCAP 242*   Lipid Profile: No results for input(s): CHOL, HDL, LDLCALC, TRIG, CHOLHDL, LDLDIRECT in the last 72 hours. Thyroid Function Tests: No results for input(s): TSH, T4TOTAL, FREET4, T3FREE, THYROIDAB in the last 72 hours. Anemia Panel: No results for input(s): VITAMINB12, FOLATE, FERRITIN, TIBC, IRON, RETICCTPCT  in the last 72 hours. Urine analysis: No results found for: COLORURINE, APPEARANCEUR, LABSPEC, [place], GLUCOSEU, HGBUR, BILIRUBINUR, KETONESUR, PROTEINUR, UROBILINOGEN, NITRITE, LEUKOCYTESUR  Radiological Exams on Admission: Ct Angio Head W Or Wo Contrast  Result Date: 05/28/2018 CLINICAL DATA:  Code stroke, left-sided weakness EXAM: CT ANGIOGRAPHY HEAD AND NECK TECHNIQUE: Multidetector CT imaging of the head and neck was performed using the standard protocol during bolus administration of intravenous contrast. Multiplanar CT image reconstructions and MIPs were obtained to evaluate the vascular anatomy. Carotid stenosis measurements (when applicable) are obtained utilizing NASCET criteria, using the distal internal carotid diameter as the denominator. CONTRAST:  80mL ISOVUE-370 IOPAMIDOL (ISOVUE-370) INJECTION 76% COMPARISON:  CT head 05/28/2018 FINDINGS: CTA NECK FINDINGS Aortic arch: Atherosclerotic aorta. Bovine branching pattern. Atherosclerotic disease in the proximal great vessels. Extensive atherosclerotic disease left subclavian artery with mild-to-moderate stenosis. This is present beginning at the level the left vertebral artery extending distally. Right carotid system: Right common carotid artery widely patent. Atherosclerotic calcification right carotid bifurcation. 70% diameter stenosis proximal right internal carotid artery due to calcific stenosis. Remainder of the internal carotid artery patent Left carotid system: Mild atherosclerotic calcification left common carotid artery which is widely patent. Atherosclerotic disease left carotid bifurcation. 25% diameter stenosis left internal carotid artery origin. Vertebral arteries: Occlusion of the left vertebral artery with reconstitution at approximately C4. Small distal left vertebral artery is patent to the basilar. Right vertebral artery is dominant and widely patent to the basilar. Skeleton: No acute skeletal abnormality. Other neck: Goiter.   Negative for mass or adenopathy. Upper chest: Apical emphysema and scarring. No acute infiltrate or mass. Review of the MIP images confirms the above findings CTA HEAD FINDINGS Anterior circulation: Atherosclerotic calcification in the cavernous carotid bilaterally with mild stenosis bilaterally. Anterior and middle cerebral arteries are patent bilaterally without occlusion. There is diffuse atherosclerotic disease in the middle cerebral artery branches bilaterally. Mild diffuse stenosis left M1 segment. Mild atherosclerotic disease in the distal anterior cerebral artery branches bilaterally. Posterior circulation: Both vertebral arteries patent to the basilar. Basilar widely patent. Superior cerebellar and posterior cerebral arteries patent bilaterally. Mild atherosclerotic irregularity and stenosis in the posterior cerebral arteries bilaterally. Venous sinuses: Limited evaluation due to timing of the scan with limited venous contrast Anatomic variants: None Delayed phase: Not performed Review of the MIP images confirms the above findings IMPRESSION: 1. 70% diameter stenosis proximal right internal carotid artery due to calcific stenosis. 2. 25% stenosis left internal carotid origin due  to calcific stenosis. 3. Occlusion of non dominant left vertebral artery proximally with reconstitution. Occlusion is of indeterminate age. Right vertebral artery widely patent 4. Diffuse intracranial atherosclerotic disease. 5. Negative for emergent large vessel occlusion. Electronically Signed   By: Franchot Gallo M.D.   On: 05/28/2018 19:04   Ct Angio Neck W Or Wo Contrast  Result Date: 05/28/2018 CLINICAL DATA:  Code stroke, left-sided weakness EXAM: CT ANGIOGRAPHY HEAD AND NECK TECHNIQUE: Multidetector CT imaging of the head and neck was performed using the standard protocol during bolus administration of intravenous contrast. Multiplanar CT image reconstructions and MIPs were obtained to evaluate the vascular anatomy.  Carotid stenosis measurements (when applicable) are obtained utilizing NASCET criteria, using the distal internal carotid diameter as the denominator. CONTRAST:  21mL ISOVUE-370 IOPAMIDOL (ISOVUE-370) INJECTION 76% COMPARISON:  CT head 05/28/2018 FINDINGS: CTA NECK FINDINGS Aortic arch: Atherosclerotic aorta. Bovine branching pattern. Atherosclerotic disease in the proximal great vessels. Extensive atherosclerotic disease left subclavian artery with mild-to-moderate stenosis. This is present beginning at the level the left vertebral artery extending distally. Right carotid system: Right common carotid artery widely patent. Atherosclerotic calcification right carotid bifurcation. 70% diameter stenosis proximal right internal carotid artery due to calcific stenosis. Remainder of the internal carotid artery patent Left carotid system: Mild atherosclerotic calcification left common carotid artery which is widely patent. Atherosclerotic disease left carotid bifurcation. 25% diameter stenosis left internal carotid artery origin. Vertebral arteries: Occlusion of the left vertebral artery with reconstitution at approximately C4. Small distal left vertebral artery is patent to the basilar. Right vertebral artery is dominant and widely patent to the basilar. Skeleton: No acute skeletal abnormality. Other neck: Goiter.  Negative for mass or adenopathy. Upper chest: Apical emphysema and scarring. No acute infiltrate or mass. Review of the MIP images confirms the above findings CTA HEAD FINDINGS Anterior circulation: Atherosclerotic calcification in the cavernous carotid bilaterally with mild stenosis bilaterally. Anterior and middle cerebral arteries are patent bilaterally without occlusion. There is diffuse atherosclerotic disease in the middle cerebral artery branches bilaterally. Mild diffuse stenosis left M1 segment. Mild atherosclerotic disease in the distal anterior cerebral artery branches bilaterally. Posterior  circulation: Both vertebral arteries patent to the basilar. Basilar widely patent. Superior cerebellar and posterior cerebral arteries patent bilaterally. Mild atherosclerotic irregularity and stenosis in the posterior cerebral arteries bilaterally. Venous sinuses: Limited evaluation due to timing of the scan with limited venous contrast Anatomic variants: None Delayed phase: Not performed Review of the MIP images confirms the above findings IMPRESSION: 1. 70% diameter stenosis proximal right internal carotid artery due to calcific stenosis. 2. 25% stenosis left internal carotid origin due to calcific stenosis. 3. Occlusion of non dominant left vertebral artery proximally with reconstitution. Occlusion is of indeterminate age. Right vertebral artery widely patent 4. Diffuse intracranial atherosclerotic disease. 5. Negative for emergent large vessel occlusion. Electronically Signed   By: Franchot Gallo M.D.   On: 05/28/2018 19:04   Ct Abdomen Pelvis W Contrast  Result Date: 05/28/2018 CLINICAL DATA:  Bowel incontinence falling witness seizure. Vomiting. EXAM: CT ABDOMEN AND PELVIS WITH CONTRAST TECHNIQUE: Multidetector CT imaging of the abdomen and pelvis was performed using the standard protocol following bolus administration of intravenous contrast. CONTRAST:  131mL OMNIPAQUE IOHEXOL 300 MG/ML  SOLN COMPARISON:  None. FINDINGS: Lower chest: Mild opacity in the left base on series 4, image 1. Atelectasis or scar in the right base. No other acute abnormalities. Hepatobiliary: No focal liver abnormality is seen. No gallstones, gallbladder wall thickening, or biliary dilatation.  Pancreas: Unremarkable. No pancreatic ductal dilatation or surrounding inflammatory changes. Spleen: Normal in size without focal abnormality. Adrenals/Urinary Tract: Right renal cysts. No suspicious masses. The ureters and bladder are normal. The adrenal glands are unremarkable. Stomach/Bowel: The stomach and small bowel are normal. The  colon is not well distended distally. There are scattered colonic diverticuli without focal diverticulitis. The wall of the distal transverse and descending colon is prominent without focal adjacent fat stranding. Remainder of the colon is normal. The appendix is not visualized but there is no secondary evidence of appendicitis. Vascular/Lymphatic: Atherosclerotic changes are seen in the nonaneurysmal aorta. No adenopathy. Reproductive: Status post hysterectomy. No adnexal masses. Other: No abdominal wall hernia or abnormality. No abdominopelvic ascites. Musculoskeletal: There is a compression fracture of L1 with 60% loss of anterior height. This is age indeterminate. IMPRESSION: 1. Mild opacity at the left lung base could could represent atelectasis or subtle infiltrate. Recommend clinical correlation and follow-up to resolution. 2. The wall of the colon is thickened from the distal transverse colon through the descending colon without significant adjacent fat stranding. While it is possible the apparent wall thickening could be due to poor distention, colitis should be considered. Recommend clinical correlation. 3. Scattered colonic diverticuli without diverticulitis. 4. Atherosclerotic changes in the nonaneurysmal aorta. 5. Age-indeterminate compression fracture of L1 with 60% loss of anterior height. Recommend clinical correlation. Electronically Signed   By: Dorise Bullion III M.D   On: 05/28/2018 22:51   Ct Head Code Stroke Wo Contrast  Result Date: 05/28/2018 CLINICAL DATA:  Code stroke.  Left-sided weakness. EXAM: CT HEAD WITHOUT CONTRAST TECHNIQUE: Contiguous axial images were obtained from the base of the skull through the vertex without intravenous contrast. COMPARISON:  None. FINDINGS: Brain: There is no mass, hemorrhage or extra-axial collection. There is generalized atrophy without lobar predilection. There are old bilateral cerebellar infarcts and old small vessel infarcts of both basal ganglia  and the right pons. There is diffuse hypoattenuation of the periventricular white matter consistent with chronic ischemic microangiopathy. Carotid Vascular: No abnormal hyperdensity of the major intracranial arteries or dural venous sinuses. No intracranial atherosclerosis. Skull: The visualized skull base, calvarium and extracranial soft tissues are normal. Sinuses/Orbits: No fluid levels or advanced mucosal thickening of the visualized paranasal sinuses. No mastoid or middle ear effusion. The orbits are normal. ASPECTS [facility] Stroke Program Early CT Score) - Ganglionic level infarction (caudate, lentiform nuclei, internal capsule, insula, M1-M3 cortex): 7 - Supraganglionic infarction (M4-M6 cortex): 3 Total score (0-10 with 10 being normal): 10 IMPRESSION: 1. No hemorrhage or mass effect. 2. ASPECTS is 10. 3. Chronic ischemic microangiopathy, atrophy and multiple old infarcts. These results were communicated to Dr. Roland Rack at 6:37 pm on 05/28/2018 by text page via the [facility] messaging system. Electronically Signed   By: Ulyses Jarred M.D.   On: 05/28/2018 18:38    EKG: Independently reviewed. EKG with atrial flutter (heart rate 111) but poor quality study with baseline wander in most leads.   Assessment/Plan Principal Problem:   Ischemic colitis ([place]) Active Problems:   Sepsis ([place])   Hematochezia   Acute encephalopathy   Type 2 diabetes mellitus ([place])   Hypertension   Depression   COPD (chronic obstructive pulmonary disease) ([place])   Sepsis secondary to suspected ischemic colitis, Hematochezia Patient reported to have bloody bowel movements in the ED.  FOBT positive.  She is afebrile, however, does have leukocytosis (white count 18.2).  Hemoglobin 14.2 on CBC and 16.0 on i-STAT chemistry. Initially hypotensive  when found by EMS but blood pressure normal after IV fluid resuscitation.  CT abdomen pelvis with evidence of colitis.  I discussed her case with Dr. Luan Pulling from  gastroenterology.  He thinks the hematochezia is likely related to ischemic colitis.  Recommended continuing to monitor her blood count, GI will see the patient in the morning. -Continue Zosyn -CBC every 4 hours -IV fluid hydration -Zofran PRN nausea -Hold home aspirin and NSAID -Check hepatic function panel -Keep n.p.o. at this time  ? PNA on imaging CT abdomen pelvis with contrast showing mild opacity at the left lung base which could represent atelectasis or subtle infiltrate.  Her home oxygen requirement is 2 L at all times.  She is currently satting well on 1.5 L oxygen via nasal cannula.  Afebrile.  Leukocytosis could also be explained by colitis.  -Continue to monitor  Acute encephalopathy Likely due to hypotension in the setting of suspected ischemic colitis. BP 50s/30s when initially seen by EMS. Now normal status post IV fluid resuscitation.  She was not hypoglycemic on admission.  CT head negative for acute findings.  Left-sided deficits from prior stroke.  CTA head and neck showing 70% stenosis of proximal right internal carotid artery, 25% stenosis of left internal carotid origin, occlusion of nondominant left vertebral artery proximally with reconstitution (indeterminate age), diffuse intracranial atherosclerotic disease. CTA negative for emergent large vessel occlusion. Per daughter, patient did not lose consciousness.  Patient was seen by neurology (Dr. Leonel Ramsay) and her presentation is thought to be less likely secondary to seizure or new stroke. His recommendation is not to start antiepileptics unless she has further events that was suspected to be seizure.  He is recommending no further imaging unless her neurologic status is not at baseline per her daughter tomorrow.  If she reports a change, then an MRI could be obtained.  -Continue IV fluid hydration -Orthostatics -Management of colitis as above  Abnormal EKG finding  EKG with ?atrial flutter (heart rate 111) but poor  quality study with baseline wander in most leads.  Patient is not attached to a cardiac monitor in the ED. -Repeat EKG -Cardiac monitoring  Type 2 diabetes Currently CBG 389 reported by nursing staff. -Check A1c -Lantus 20 units at bedtime (reduced in comparison to home dose of 50u as patient is NPO) -SSI-S -CBG checks -Hold home glipizide  Hypertension -Hold home medications in the setting of sepsis.  Depression -Hold home Paxil at this time; patient is n.p.o.  COPD -Listed in her chart and reported by daughter.  No home inhalers listed.  No prior PFTs in chart. Not wheezing on exam.  On 1.5 L oxygen via nasal cannula (uses 2 L oxygen at home at all times). -DuoNeb q6 hrs as needed  DVT prophylaxis: SCDs Code Status: Patient appears slightly confused at this time.  Her orientation is not at baseline.  Daughter wants patient to be full code.  This needs to be discussed further with the patient when her orientation is at baseline. Family Communication: Daughter updated over the phone and son updated at bedside. Disposition Plan: Anticipate discharge to home versus SNF in 2 to 3 days. Consults called: Gastroenterology (Dr. Alessandra Bevels), neurology (Dr. Leonel Ramsay) Admission status: It is my clinical opinion that admission to INPATIENT is reasonable and necessary in this 72 y.o. female . presenting with symptoms of acute encephalopathy, abdominal pain, hematochezia, concerning for sepsis secondary to colitis . in the context of PMH including: Hypertension, diabetes, COPD . with pertinent positives on physical  exam including: Abdominal pain, noted to have bloody bowel movements in the ED . and pertinent positives on radiographic and laboratory data including: Evidence of colitis, FOBT positive . Workup and treatment include IV antibiotic, IV fluid.  CBC every 4 hours.  Neurology following.  Gastroenterology evaluation pending.  Given the aforementioned, the predictability of an adverse  outcome is felt to be significant. I expect that the patient will require at least 2 midnights in the hospital to treat this condition.    Shela Leff MD [facility] Pager [phone]  If 7PM-7AM, please contact night-coverage [email or web address] Password [email or web address]  05/29/2018, 1:52 AM

## 2018-05-28 NOTE — ED Triage Notes (Signed)
Pt was at [facility] with her daughter when she had a witnessed seizure and was assisted to the ground. Patient vomited and had an episode of incontinence in the EMS truck. BP decreased to 50s/30s for EMS. Pt given fluids for hypotension. Reports known left sided weakness. Pt on NRB upon arrival to ED- O2 92%. Code stroke called at 1817. LKN 1730.  BP 100/70 at 1821

## 2018-05-28 NOTE — ED Notes (Signed)
ACTIVATED CODE STROKE PER DR.ZAVTIZ AND CRN [place] @ 18:19-SPOKE WITH PHIL

## 2018-05-28 NOTE — ED Notes (Signed)
CANCELED CODE STROKE PER DR.KIRKPATRICK

## 2018-05-28 NOTE — Consult Note (Addendum)
[facility]     Requesting Physician: Dr. Reather Converse    Chief Complaint: left side  AMS  History obtained from:  Patient  / daughter  HPI:                                                                                                                                         Crystal Moore is an 72 y.o. female  PMH significant for Diabetes, seizure, and stroke presented to [facility] ED with left side flaccid and AMS. Code stroke called in ED. Code stroke was later canceled.   Per EMS patient was at [place].  She became unresponsive they sat her down, she started vomiting, was incontinent of stool and urine. In route EMS said that noticed that her left side was flaccid. Per daughter this left sided weakness is not new and the patient has had multiple strokes. It has been roughly 8 years since the last seizure. Patient has had several episodes of emesis, and continues to be incontinent of stool. Patient BP in route dropped to 50/30. Daughter states that her mothers usual seizures occur where she is staring off, almost like she is "looking through you". Denies any pain. Endorses nausea and vomiting.  ED course:  Ct head: no hemorrhage. CTA; NO LVO BP:75/54 BG: 242  Date last known well: Date: 05/28/2018 Time last known well: Time: 17:45 tPA Given: No: seizure not stroke  No past medical history on file.  No family history on file.      Social History:  has no tobacco, alcohol, and drug history on file.  Allergies: Allergies not on file  Medications:                                                                                                                          Current Facility-Administered Medications  Medication Dose Route Frequency Provider Last Rate Last Dose  . LORazepam (ATIVAN) 2 MG/ML injection           . sodium chloride 0.9 % bolus 1,000 mL  1,000 mL Intravenous Once Rozier, Chanda Busing, MD [phone]  mL/hr at 05/28/18  1846 1,000 mL at 05/28/18 1846   No current outpatient medications on file.     ROS:                                                                                                                                       ROS was performed and is negative except as noted in HPI   General Examination:                                                                                                      There were no vitals taken for this visit.  HEENT-  Normocephalic, no lesions, without obvious abnormality.  Normal external eye and conjunctiva. Edentulous. Cardiovascular- S1-S2 audible, pulses palpable throughout   Lungs-no rhonchi or wheezing noted, no excessive working breathing.  Saturations within normal limits Abdomen- All 4 quadrants palpated and nontender Extremities- Warm, dry and intact Musculoskeletal-no joint tenderness, deformity or swelling Skin-warm and dry, no hyperpigmentation, vitiligo, or suspicious lesions  Neurological Examination Mental Status: Alert, oriented  thought content appropriate.  Speech fluent without evidence of aphasia.  Dysarthria noted. Cranial Nerves: EOEMI, PERRLA, left side of face with decreased sensation. Motor: Right : Upper extremity   5/5 Left:     Upper extremity   0/5  Lower extremity   4/5  LE, minimal movement but she does pull back from noxious stimulation slightly Tone and bulk:normal tone throughout; no atrophy noted Sensory: left side with decreased sensation Gait: deferred   Lab Results:  CBG: Recent Labs  Lab 05/28/18 1819  GLUCAP 242*    Imaging: Ct Head Code Stroke Wo Contrast  Result Date: 05/28/2018 CLINICAL DATA:  Code stroke.  Left-sided weakness. EXAM: CT HEAD WITHOUT CONTRAST TECHNIQUE: Contiguous axial images were obtained from the base of the skull through the vertex without intravenous contrast. COMPARISON:  None. FINDINGS: Brain: There is no mass, hemorrhage or extra-axial collection. There is generalized  atrophy without lobar predilection. There are old bilateral cerebellar infarcts and old small vessel infarcts of both basal ganglia and the right pons. There is diffuse hypoattenuation of the periventricular white matter consistent with chronic ischemic microangiopathy. Carotid Vascular: No abnormal hyperdensity of the major intracranial arteries or dural venous sinuses. No intracranial atherosclerosis. Skull: The visualized skull base, calvarium and extracranial soft tissues are normal. Sinuses/Orbits: No fluid levels or advanced mucosal thickening of the visualized paranasal sinuses. No mastoid or middle ear effusion. The orbits are normal. ASPECTS [facility] Stroke  Program Early CT Score) - Ganglionic level infarction (caudate, lentiform nuclei, internal capsule, insula, M1-M3 cortex): 7 - Supraganglionic infarction (M4-M6 cortex): 3 Total score (0-10 with 10 being normal): 10 IMPRESSION: 1. No hemorrhage or mass effect. 2. ASPECTS is 10. 3. Chronic ischemic microangiopathy, atrophy and multiple old infarcts. These results were communicated to Dr. Roland Rack at 6:37 pm on 05/28/2018 by text page via the [facility] messaging system. Electronically Signed   By: Ulyses Jarred M.D.   On: 05/28/2018 18:38    Laurey Morale, MSN, NP-C [facility] [phone]  05/28/2018, 6:56 PM   Attending physician note to follow with Assessment and plan .  I have seen the patient reviewed the above note.  The patient did not have any shaking or other signs of clear seizure activity.  She became acutely unresponsive in the setting of severe hypotension  Assessment: 72 y.o. female with baseline severe left hemiparesis and left hemianopia due to previous strokes who presents with transient unresponsiveness in the setting of severe hypotension with a systolic measured at 50.  Though she has a history of seizures, I am not at all certain that the events tonight were in any way related to seizure  activity.   Impression: Syncope in the setting of low BP    Recommendations: -Evaluation of hypotension per ED -I would not favor starting antiepileptics unless she has further events which are suspected to be seizure. -I would not favor further imaging unless her neurological status is not at baseline per her daughter tomorrow.  If she reports a change, then an MRI could be obtained.   Roland Rack, MD [facility] [phone]  If 7pm- 7am, please page neurology on call as listed in [place].

## 2018-05-28 NOTE — ED Provider Notes (Signed)
[facility] EMERGENCY DEPARTMENT Provider Note   CSN: [id number] Arrival date & time: 05/28/18  1818     History   Chief Complaint Chief Complaint  Patient presents with  . Seizures  . Code Stroke    HPI Crystal Moore is a 72 y.o. female.  HPI Patient is a 72 year old female past medical history of COPD, diabetes, and previous CVA with residual left-sided weakness and reported hemineglect who presents to the emergency department for evaluation of his possible seizure activity earlier this afternoon while at [facility].  Patient was with her daughter at which time she attempted to walk instead of waiting on a power wheelchair after doing this patient then collapsed the floor.  Then she had an episode of vomiting and staring off for which EMS was called.  EMS reports that upon arrival patient continued to stare off to the right and had flacidity of her left upper and lower extremities.  Also report that in route she had multiple episodes of vomiting and a very large bowel movement.  Upon arrival to the emergency department patient is awake and alert but is not responding to most questioning.  She is protecting her airway and is able to tell me her name.  Was immediately made a code stroke as we were not aware of patient's history of previous CVA with residual left-sided weakness.  I then spoke with her daughter who states that patient was at her normal state of health this morning but has been having some difficulty with glycemic control. States that she does not normally walk much on her own, and while at [place] she attempted to ambulate on her own before collapsing. She then vomited and was very somnolent. She tried to give her some sugary foods thinking that she was weak because of hypoglycemia, but she was unable to eat them. After being stabilized patient complains of continued nausea, abdominal pain, and generalized fatigue. Remaining ROS as detailed below.   Past Medical  History:  Diagnosis Date  . Arthritis   . COPD (chronic obstructive pulmonary disease) ([place])   . CVA (cerebral vascular accident) ([place])   . Diabetes ([place])   . Hemiparesis affecting left side as late effect of cerebrovascular accident (CVA) ([place])   . Pyelonephritis   . Seizures ([place])   . Tobacco use   . UTI (urinary tract infection)     Patient Active Problem List   Diagnosis Date Noted  . Hematochezia 05/29/2018  . Ischemic colitis ([place]) 05/29/2018  . Acute encephalopathy 05/29/2018  . Type 2 diabetes mellitus ([place]) 05/29/2018  . Hypertension 05/29/2018  . Depression 05/29/2018  . COPD (chronic obstructive pulmonary disease) ([place]) 05/29/2018  . Sepsis ([place]) 05/28/2018    Past Surgical History:  Procedure Laterality Date  . TUBAL LIGATION       OB History   None      Home Medications    Prior to Admission medications   Medication Sig Start Date End Date Taking? Authorizing Provider  amLODipine-benazepril (LOTREL) 5-20 MG capsule Take 1 capsule by mouth daily.   Yes [provider]  aspirin EC 81 MG tablet Take 81 mg by mouth daily.   Yes [provider]  Fluticasone-Umeclidin-Vilant (TRELEGY ELLIPTA) 100-62.5-25 MCG/INH AEPB Inhale 1 puff into the lungs daily.   Yes [provider]  glipiZIDE (GLUCOTROL XL) 5 MG 24 hr tablet Take 5 mg by mouth 2 (two) times daily.   Yes [provider]  Insulin Glargine (LANTUS SOLOSTAR) 100  UNIT/ML Solostar Pen Inject 50 Units into the skin daily before breakfast.   Yes [provider]  metFORMIN (GLUCOPHAGE) 500 MG tablet Take 500 mg by mouth 2 (two) times daily.   Yes [provider]  naproxen sodium (ALEVE) 220 MG tablet Take 220 mg by mouth 2 (two) times daily as needed (for pain).   Yes [provider]  PARoxetine (PAXIL) 10 MG tablet Take 10 mg by mouth daily.   Yes [provider]    Family History Family History  Problem Relation Age of Onset  .  Diabetes Mother   . Diabetes Sister   . Diabetes Brother     Social History Social History   Tobacco Use  . Smoking status: Current Every Day Smoker    Packs/day: 1.00    Years: 40.00    Pack years: 40.00    Types: Cigarettes  Substance Use Topics  . Alcohol use: Never    Frequency: Never  . Drug use: Never     Allergies   Patient has no known allergies.   Review of Systems Review of Systems  Unable to perform ROS: Acuity of condition  Constitutional: Positive for fatigue. Negative for chills and fever.  HENT: Negative for ear pain and sore throat.   Eyes: Negative for pain and visual disturbance.  Respiratory: Negative for cough and shortness of breath.   Cardiovascular: Negative for chest pain and palpitations.  Gastrointestinal: Positive for abdominal pain, nausea and vomiting.  Genitourinary: Negative for dysuria and hematuria.  Musculoskeletal: Negative for arthralgias and back pain.  Skin: Negative for color change and rash.  Neurological: Negative for seizures and syncope.  All other systems reviewed and are negative.    Physical Exam Updated Vital Signs BP (!) 127/49   Pulse 94   Temp 99 F (37.2 C) (Oral)   Resp 20   Ht 5\' 2"  (1.575 m)   Wt 69 kg   SpO2 96%   BMI 27.82 kg/m   Physical Exam  Constitutional: She appears well-developed. She appears ill. She appears distressed.  HENT:  Head: Normocephalic and atraumatic.  Eyes: Conjunctivae are normal.  Neck: Neck supple.  Cardiovascular: Normal rate and intact distal pulses.  Pulmonary/Chest: Effort normal. No respiratory distress.  Abdominal: Soft. There is tenderness (generalized. ).  Genitourinary: Rectal exam shows guaiac positive stool.  Musculoskeletal: She exhibits no edema.  Neurological: She is alert.  Patient with left sided neglect. She has flaccidity of the left side that is reported to be chronic. She has no facial droop and no appreciable word slurring. Oriented to person and  place but not time during my initial interview.   Skin: Skin is warm and dry.  Psychiatric: Her behavior is normal.  Nursing note and vitals reviewed.    ED Treatments / Results  Labs (all labs ordered are listed, but only abnormal results are displayed) Labs Reviewed  PROTIME-INR - Abnormal; Notable for the following components:      Result Value   Prothrombin Time 15.3 (*)    All other components within normal limits  APTT - Abnormal; Notable for the following components:   aPTT 23 (*)    All other components within normal limits  CBC - Abnormal; Notable for the following components:   WBC 18.2 (*)    RBC 5.51 (*)    HCT 46.3 (*)    MCH 25.8 (*)    All other components within normal limits  DIFFERENTIAL - Abnormal; Notable for the following  components:   Neutro Abs 11.4 (*)    Lymphs Abs 5.9 (*)    Abs Immature Granulocytes 0.11 (*)    All other components within normal limits  CBC - Abnormal; Notable for the following components:   WBC 18.7 (*)    RBC 5.77 (*)    HCT 48.8 (*)    MCH 25.8 (*)    All other components within normal limits  CBC - Abnormal; Notable for the following components:   WBC 18.7 (*)    RBC 5.34 (*)    All other components within normal limits  CBC - Abnormal; Notable for the following components:   WBC 16.5 (*)    All other components within normal limits  HEPATIC FUNCTION PANEL - Abnormal; Notable for the following components:   Albumin 3.4 (*)    All other components within normal limits  LACTIC ACID, PLASMA - Abnormal; Notable for the following components:   Lactic Acid, Venous 2.1 (*)    All other components within normal limits  CBG MONITORING, ED - Abnormal; Notable for the following components:   Glucose-Capillary 242 (*)    All other components within normal limits  I-STAT CHEM 8, ED - Abnormal; Notable for the following components:   Glucose, Bld 278 (*)    Hemoglobin 16.0 (*)    HCT 47.0 (*)    All other components within  normal limits  POC OCCULT BLOOD, ED - Abnormal; Notable for the following components:   Fecal Occult Bld POSITIVE (*)    All other components within normal limits  CBG MONITORING, ED - Abnormal; Notable for the following components:   Glucose-Capillary 389 (*)    All other components within normal limits  CBG MONITORING, ED - Abnormal; Notable for the following components:   Glucose-Capillary 289 (*)    All other components within normal limits  CBG MONITORING, ED - Abnormal; Notable for the following components:   Glucose-Capillary 146 (*)    All other components within normal limits  MRSA PCR SCREENING  RESPIRATORY PANEL BY PCR  PROCALCITONIN  TROPONIN I  LACTIC ACID, PLASMA  TROPONIN I  I-STAT TROPONIN, ED  TYPE AND SCREEN  ABO/RH    EKG None  Radiology Ct Angio Head W Or Wo Contrast  Result Date: 05/28/2018 CLINICAL DATA:  Code stroke, left-sided weakness EXAM: CT ANGIOGRAPHY HEAD AND NECK TECHNIQUE: Multidetector CT imaging of the head and neck was performed using the standard protocol during bolus administration of intravenous contrast. Multiplanar CT image reconstructions and MIPs were obtained to evaluate the vascular anatomy. Carotid stenosis measurements (when applicable) are obtained utilizing NASCET criteria, using the distal internal carotid diameter as the denominator. CONTRAST:  52mL ISOVUE-370 IOPAMIDOL (ISOVUE-370) INJECTION 76% COMPARISON:  CT head 05/28/2018 FINDINGS: CTA NECK FINDINGS Aortic arch: Atherosclerotic aorta. Bovine branching pattern. Atherosclerotic disease in the proximal great vessels. Extensive atherosclerotic disease left subclavian artery with mild-to-moderate stenosis. This is present beginning at the level the left vertebral artery extending distally. Right carotid system: Right common carotid artery widely patent. Atherosclerotic calcification right carotid bifurcation. 70% diameter stenosis proximal right internal carotid artery due to calcific  stenosis. Remainder of the internal carotid artery patent Left carotid system: Mild atherosclerotic calcification left common carotid artery which is widely patent. Atherosclerotic disease left carotid bifurcation. 25% diameter stenosis left internal carotid artery origin. Vertebral arteries: Occlusion of the left vertebral artery with reconstitution at approximately C4. Small distal left vertebral artery is patent to the basilar. Right vertebral artery is  dominant and widely patent to the basilar. Skeleton: No acute skeletal abnormality. Other neck: Goiter.  Negative for mass or adenopathy. Upper chest: Apical emphysema and scarring. No acute infiltrate or mass. Review of the MIP images confirms the above findings CTA HEAD FINDINGS Anterior circulation: Atherosclerotic calcification in the cavernous carotid bilaterally with mild stenosis bilaterally. Anterior and middle cerebral arteries are patent bilaterally without occlusion. There is diffuse atherosclerotic disease in the middle cerebral artery branches bilaterally. Mild diffuse stenosis left M1 segment. Mild atherosclerotic disease in the distal anterior cerebral artery branches bilaterally. Posterior circulation: Both vertebral arteries patent to the basilar. Basilar widely patent. Superior cerebellar and posterior cerebral arteries patent bilaterally. Mild atherosclerotic irregularity and stenosis in the posterior cerebral arteries bilaterally. Venous sinuses: Limited evaluation due to timing of the scan with limited venous contrast Anatomic variants: None Delayed phase: Not performed Review of the MIP images confirms the above findings IMPRESSION: 1. 70% diameter stenosis proximal right internal carotid artery due to calcific stenosis. 2. 25% stenosis left internal carotid origin due to calcific stenosis. 3. Occlusion of non dominant left vertebral artery proximally with reconstitution. Occlusion is of indeterminate age. Right vertebral artery widely patent  4. Diffuse intracranial atherosclerotic disease. 5. Negative for emergent large vessel occlusion. Electronically Signed   By: Franchot Gallo M.D.   On: 05/28/2018 19:04   Ct Angio Neck W Or Wo Contrast  Result Date: 05/28/2018 CLINICAL DATA:  Code stroke, left-sided weakness EXAM: CT ANGIOGRAPHY HEAD AND NECK TECHNIQUE: Multidetector CT imaging of the head and neck was performed using the standard protocol during bolus administration of intravenous contrast. Multiplanar CT image reconstructions and MIPs were obtained to evaluate the vascular anatomy. Carotid stenosis measurements (when applicable) are obtained utilizing NASCET criteria, using the distal internal carotid diameter as the denominator. CONTRAST:  53mL ISOVUE-370 IOPAMIDOL (ISOVUE-370) INJECTION 76% COMPARISON:  CT head 05/28/2018 FINDINGS: CTA NECK FINDINGS Aortic arch: Atherosclerotic aorta. Bovine branching pattern. Atherosclerotic disease in the proximal great vessels. Extensive atherosclerotic disease left subclavian artery with mild-to-moderate stenosis. This is present beginning at the level the left vertebral artery extending distally. Right carotid system: Right common carotid artery widely patent. Atherosclerotic calcification right carotid bifurcation. 70% diameter stenosis proximal right internal carotid artery due to calcific stenosis. Remainder of the internal carotid artery patent Left carotid system: Mild atherosclerotic calcification left common carotid artery which is widely patent. Atherosclerotic disease left carotid bifurcation. 25% diameter stenosis left internal carotid artery origin. Vertebral arteries: Occlusion of the left vertebral artery with reconstitution at approximately C4. Small distal left vertebral artery is patent to the basilar. Right vertebral artery is dominant and widely patent to the basilar. Skeleton: No acute skeletal abnormality. Other neck: Goiter.  Negative for mass or adenopathy. Upper chest: Apical  emphysema and scarring. No acute infiltrate or mass. Review of the MIP images confirms the above findings CTA HEAD FINDINGS Anterior circulation: Atherosclerotic calcification in the cavernous carotid bilaterally with mild stenosis bilaterally. Anterior and middle cerebral arteries are patent bilaterally without occlusion. There is diffuse atherosclerotic disease in the middle cerebral artery branches bilaterally. Mild diffuse stenosis left M1 segment. Mild atherosclerotic disease in the distal anterior cerebral artery branches bilaterally. Posterior circulation: Both vertebral arteries patent to the basilar. Basilar widely patent. Superior cerebellar and posterior cerebral arteries patent bilaterally. Mild atherosclerotic irregularity and stenosis in the posterior cerebral arteries bilaterally. Venous sinuses: Limited evaluation due to timing of the scan with limited venous contrast Anatomic variants: None Delayed phase: Not performed Review of  the MIP images confirms the above findings IMPRESSION: 1. 70% diameter stenosis proximal right internal carotid artery due to calcific stenosis. 2. 25% stenosis left internal carotid origin due to calcific stenosis. 3. Occlusion of non dominant left vertebral artery proximally with reconstitution. Occlusion is of indeterminate age. Right vertebral artery widely patent 4. Diffuse intracranial atherosclerotic disease. 5. Negative for emergent large vessel occlusion. Electronically Signed   By: Franchot Gallo M.D.   On: 05/28/2018 19:04   Ct Abdomen Pelvis W Contrast  Result Date: 05/28/2018 CLINICAL DATA:  Bowel incontinence falling witness seizure. Vomiting. EXAM: CT ABDOMEN AND PELVIS WITH CONTRAST TECHNIQUE: Multidetector CT imaging of the abdomen and pelvis was performed using the standard protocol following bolus administration of intravenous contrast. CONTRAST:  168mL OMNIPAQUE IOHEXOL 300 MG/ML  SOLN COMPARISON:  None. FINDINGS: Lower chest: Mild opacity in the left  base on series 4, image 1. Atelectasis or scar in the right base. No other acute abnormalities. Hepatobiliary: No focal liver abnormality is seen. No gallstones, gallbladder wall thickening, or biliary dilatation. Pancreas: Unremarkable. No pancreatic ductal dilatation or surrounding inflammatory changes. Spleen: Normal in size without focal abnormality. Adrenals/Urinary Tract: Right renal cysts. No suspicious masses. The ureters and bladder are normal. The adrenal glands are unremarkable. Stomach/Bowel: The stomach and small bowel are normal. The colon is not well distended distally. There are scattered colonic diverticuli without focal diverticulitis. The wall of the distal transverse and descending colon is prominent without focal adjacent fat stranding. Remainder of the colon is normal. The appendix is not visualized but there is no secondary evidence of appendicitis. Vascular/Lymphatic: Atherosclerotic changes are seen in the nonaneurysmal aorta. No adenopathy. Reproductive: Status post hysterectomy. No adnexal masses. Other: No abdominal wall hernia or abnormality. No abdominopelvic ascites. Musculoskeletal: There is a compression fracture of L1 with 60% loss of anterior height. This is age indeterminate. IMPRESSION: 1. Mild opacity at the left lung base could could represent atelectasis or subtle infiltrate. Recommend clinical correlation and follow-up to resolution. 2. The wall of the colon is thickened from the distal transverse colon through the descending colon without significant adjacent fat stranding. While it is possible the apparent wall thickening could be due to poor distention, colitis should be considered. Recommend clinical correlation. 3. Scattered colonic diverticuli without diverticulitis. 4. Atherosclerotic changes in the nonaneurysmal aorta. 5. Age-indeterminate compression fracture of L1 with 60% loss of anterior height. Recommend clinical correlation. Electronically Signed   By: Dorise Bullion III M.D   On: 05/28/2018 22:51   Ct Head Code Stroke Wo Contrast  Result Date: 05/28/2018 CLINICAL DATA:  Code stroke.  Left-sided weakness. EXAM: CT HEAD WITHOUT CONTRAST TECHNIQUE: Contiguous axial images were obtained from the base of the skull through the vertex without intravenous contrast. COMPARISON:  None. FINDINGS: Brain: There is no mass, hemorrhage or extra-axial collection. There is generalized atrophy without lobar predilection. There are old bilateral cerebellar infarcts and old small vessel infarcts of both basal ganglia and the right pons. There is diffuse hypoattenuation of the periventricular white matter consistent with chronic ischemic microangiopathy. Carotid Vascular: No abnormal hyperdensity of the major intracranial arteries or dural venous sinuses. No intracranial atherosclerosis. Skull: The visualized skull base, calvarium and extracranial soft tissues are normal. Sinuses/Orbits: No fluid levels or advanced mucosal thickening of the visualized paranasal sinuses. No mastoid or middle ear effusion. The orbits are normal. ASPECTS [facility] Stroke Program Early CT Score) - Ganglionic level infarction (caudate, lentiform nuclei, internal capsule, insula, M1-M3 cortex): 7 - Supraganglionic  infarction (M4-M6 cortex): 3 Total score (0-10 with 10 being normal): 10 IMPRESSION: 1. No hemorrhage or mass effect. 2. ASPECTS is 10. 3. Chronic ischemic microangiopathy, atrophy and multiple old infarcts. These results were communicated to Dr. Roland Rack at 6:37 pm on 05/28/2018 by text page via the [facility] messaging system. Electronically Signed   By: Ulyses Jarred M.D.   On: 05/28/2018 18:38    Procedures Procedures (including critical care time)  Medications Ordered in ED Medications  ondansetron (ZOFRAN) injection 4 mg (has no administration in time range)  0.9 %  sodium chloride infusion ( Intravenous Canceled Entry 05/29/18 1325)  insulin aspart (novoLOG) injection 0-9 Units  (1 Units Subcutaneous Given 05/29/18 1217)  morphine 2 MG/ML injection 1 mg (has no administration in time range)  ipratropium-albuterol (DUONEB) 0.5-2.5 (3) MG/3ML nebulizer solution 3 mL (has no administration in time range)  insulin glargine (LANTUS) injection 20 Units (20 Units Subcutaneous Given 05/29/18 0540)  piperacillin-tazobactam (ZOSYN) IVPB 3.375 g (3.375 g Intravenous New Bag/Given 05/29/18 1446)  vancomycin (VANCOCIN) 1,250 mg in sodium chloride 0.9 % 250 mL IVPB (has no administration in time range)  iopamidol (ISOVUE-370) 76 % injection 50 mL (50 mLs Intravenous Contrast Given 05/28/18 1839)  ondansetron (ZOFRAN) injection 4 mg (4 mg Intravenous Given 05/28/18 1856)  sodium chloride 0.9 % bolus 1,000 mL (0 mLs Intravenous Stopped 05/28/18 2236)  iohexol (OMNIPAQUE) 300 MG/ML solution 100 mL (100 mLs Intravenous Contrast Given 05/28/18 2154)  piperacillin-tazobactam (ZOSYN) IVPB 3.375 g (0 g Intravenous Stopped 05/29/18 0115)     Initial Impression / Assessment and Plan / ED Course  I have reviewed the triage vital signs and the nursing notes.  Pertinent labs & imaging results that were available during my care of the patient were reviewed by me and considered in my medical decision making (see chart for details).     Patient is a 72 yo female with past medical history as detailed above presents to the ED for evaluation of possible seizure like activity, hypertension, and concern initially for stroke.  Upon arrival to the emergency department patient was made a code stroke given her left-sided neglect and left-sided flaccid paralysis.  It was found that patient's presenting symptoms are chronic in regards to her neglect and flaccid paralysis and as a result neurology did not recommend any acute interventions.  However patient did have multiple episodes of hypotension as well as a large bowel movement that was positive for occult blood and multiple episodes of vomiting.  On exam patient  does complain of abdominal pain.  As a result of CT of the abdomen and pelvis was obtained and does show findings concerning for possible colitis.  Given patient's hypotension, elevated white blood cell count, and imaging findings she was treated as a possible sepsis.  She was covered with IV antibiotics and given IV fluids.  Following these interventions patient had improvement to her blood pressure.  Her other laboratory studies are as detailed above.  Patient's stroke work-up does not reveal any new ischemic or hemorrhagic changes.  Given the findings of elevated white blood cell count, hemodynamic instability, and imaging findings the hospital service was consulted for admission for sepsis.  It was reported later patient stated that she did have frank blood in her stool.  However given patient stable hemoglobin no blood products were given while in the emergency department.  For further information regarding patient's continued hospital stay please see admitting team documentation.  The care of this patient was  discussed with my attending physician Dr. Reather Converse, who voices agreement with work-up and ED disposition.  Final Clinical Impressions(s) / ED Diagnoses   Final diagnoses:  Colitis    ED Discharge Orders    None       Hilman Kissling, Chanda Busing, MD 05/29/18 2423    Elnora Morrison, MD 06/01/18 1614

## 2018-05-29 ENCOUNTER — Encounter (HOSPITAL_COMMUNITY): Payer: Self-pay | Admitting: Internal Medicine

## 2018-05-29 ENCOUNTER — Other Ambulatory Visit: Payer: Self-pay

## 2018-05-29 DIAGNOSIS — F329 Major depressive disorder, single episode, unspecified: Secondary | ICD-10-CM | POA: Diagnosis present

## 2018-05-29 DIAGNOSIS — F32A Depression, unspecified: Secondary | ICD-10-CM

## 2018-05-29 DIAGNOSIS — K529 Noninfective gastroenteritis and colitis, unspecified: Secondary | ICD-10-CM | POA: Diagnosis present

## 2018-05-29 DIAGNOSIS — F17210 Nicotine dependence, cigarettes, uncomplicated: Secondary | ICD-10-CM | POA: Diagnosis present

## 2018-05-29 DIAGNOSIS — M1990 Unspecified osteoarthritis, unspecified site: Secondary | ICD-10-CM | POA: Diagnosis present

## 2018-05-29 DIAGNOSIS — J189 Pneumonia, unspecified organism: Secondary | ICD-10-CM | POA: Diagnosis present

## 2018-05-29 DIAGNOSIS — I69354 Hemiplegia and hemiparesis following cerebral infarction affecting left non-dominant side: Secondary | ICD-10-CM | POA: Diagnosis not present

## 2018-05-29 DIAGNOSIS — K921 Melena: Secondary | ICD-10-CM

## 2018-05-29 DIAGNOSIS — K559 Vascular disorder of intestine, unspecified: Secondary | ICD-10-CM | POA: Diagnosis present

## 2018-05-29 DIAGNOSIS — J440 Chronic obstructive pulmonary disease with acute lower respiratory infection: Secondary | ICD-10-CM | POA: Diagnosis present

## 2018-05-29 DIAGNOSIS — J449 Chronic obstructive pulmonary disease, unspecified: Secondary | ICD-10-CM

## 2018-05-29 DIAGNOSIS — E876 Hypokalemia: Secondary | ICD-10-CM | POA: Diagnosis present

## 2018-05-29 DIAGNOSIS — E440 Moderate protein-calorie malnutrition: Secondary | ICD-10-CM | POA: Diagnosis present

## 2018-05-29 DIAGNOSIS — I10 Essential (primary) hypertension: Secondary | ICD-10-CM | POA: Diagnosis present

## 2018-05-29 DIAGNOSIS — G9349 Other encephalopathy: Secondary | ICD-10-CM | POA: Diagnosis present

## 2018-05-29 DIAGNOSIS — G40909 Epilepsy, unspecified, not intractable, without status epilepticus: Secondary | ICD-10-CM | POA: Diagnosis present

## 2018-05-29 DIAGNOSIS — Z23 Encounter for immunization: Secondary | ICD-10-CM | POA: Diagnosis present

## 2018-05-29 DIAGNOSIS — R32 Unspecified urinary incontinence: Secondary | ICD-10-CM | POA: Diagnosis present

## 2018-05-29 DIAGNOSIS — G9340 Encephalopathy, unspecified: Secondary | ICD-10-CM

## 2018-05-29 DIAGNOSIS — A419 Sepsis, unspecified organism: Secondary | ICD-10-CM | POA: Diagnosis present

## 2018-05-29 DIAGNOSIS — Z9981 Dependence on supplemental oxygen: Secondary | ICD-10-CM | POA: Diagnosis not present

## 2018-05-29 DIAGNOSIS — I672 Cerebral atherosclerosis: Secondary | ICD-10-CM | POA: Diagnosis present

## 2018-05-29 DIAGNOSIS — R159 Full incontinence of feces: Secondary | ICD-10-CM | POA: Diagnosis present

## 2018-05-29 DIAGNOSIS — R9431 Abnormal electrocardiogram [ECG] [EKG]: Secondary | ICD-10-CM | POA: Diagnosis present

## 2018-05-29 DIAGNOSIS — E119 Type 2 diabetes mellitus without complications: Secondary | ICD-10-CM

## 2018-05-29 DIAGNOSIS — I7389 Other specified peripheral vascular diseases: Secondary | ICD-10-CM | POA: Diagnosis present

## 2018-05-29 DIAGNOSIS — W1839XA Other fall on same level, initial encounter: Secondary | ICD-10-CM | POA: Diagnosis present

## 2018-05-29 DIAGNOSIS — E11649 Type 2 diabetes mellitus with hypoglycemia without coma: Secondary | ICD-10-CM | POA: Diagnosis present

## 2018-05-29 DIAGNOSIS — J9611 Chronic respiratory failure with hypoxia: Secondary | ICD-10-CM | POA: Diagnosis present

## 2018-05-29 DIAGNOSIS — Z6827 Body mass index (BMI) 27.0-27.9, adult: Secondary | ICD-10-CM | POA: Diagnosis not present

## 2018-05-29 DIAGNOSIS — R414 Neurologic neglect syndrome: Secondary | ICD-10-CM | POA: Diagnosis present

## 2018-05-29 LAB — CBC
HCT: 48.8 % — ABNORMAL HIGH (ref 36.0–46.0)
HCT: 45.4 % (ref 36.0–46.0)
HCT: 41.3 % (ref 36.0–46.0)
HEMOGLOBIN: 14.9 g/dL (ref 12.0–15.0)
Hemoglobin: 13.9 g/dL (ref 12.0–15.0)
Hemoglobin: 13 g/dL (ref 12.0–15.0)
MCH: 25.8 pg — AB (ref 26.0–34.0)
MCH: 26 pg (ref 26.0–34.0)
MCH: 26.5 pg (ref 26.0–34.0)
MCHC: 30.5 g/dL (ref 30.0–36.0)
MCHC: 30.6 g/dL (ref 30.0–36.0)
MCHC: 31.5 g/dL (ref 30.0–36.0)
MCV: 84.6 fL (ref 80.0–100.0)
MCV: 85 fL (ref 80.0–100.0)
MCV: 84.1 fL (ref 80.0–100.0)
NRBC: 0 % (ref 0.0–0.2)
NRBC: 0 % (ref 0.0–0.2)
PLATELETS: 209 10*3/uL (ref 150–400)
PLATELETS: 208 10*3/uL (ref 150–400)
Platelets: 193 10*3/uL (ref 150–400)
RBC: 5.77 MIL/uL — ABNORMAL HIGH (ref 3.87–5.11)
RBC: 5.34 MIL/uL — ABNORMAL HIGH (ref 3.87–5.11)
RBC: 4.91 MIL/uL (ref 3.87–5.11)
RDW: 14.7 % (ref 11.5–15.5)
RDW: 14.7 % (ref 11.5–15.5)
RDW: 14.8 % (ref 11.5–15.5)
WBC: 18.7 10*3/uL — AB (ref 4.0–10.5)
WBC: 18.7 10*3/uL — AB (ref 4.0–10.5)
WBC: 16.5 10*3/uL — ABNORMAL HIGH (ref 4.0–10.5)
nRBC: 0 % (ref 0.0–0.2)

## 2018-05-29 LAB — RESPIRATORY PANEL BY PCR

## 2018-05-29 LAB — HEPATIC FUNCTION PANEL
ALBUMIN: 3.4 g/dL — AB (ref 3.5–5.0)
ALT: 16 U/L (ref 0–44)
AST: 20 U/L (ref 15–41)
Alkaline Phosphatase: 101 U/L (ref 38–126)
BILIRUBIN DIRECT: 0.2 mg/dL (ref 0.0–0.2)
Indirect Bilirubin: 0.5 mg/dL (ref 0.3–0.9)
Total Bilirubin: 0.7 mg/dL (ref 0.3–1.2)
Total Protein: 6.8 g/dL (ref 6.5–8.1)

## 2018-05-29 LAB — PROCALCITONIN: Procalcitonin: 8.81 ng/mL

## 2018-05-29 LAB — CBG MONITORING, ED
GLUCOSE-CAPILLARY: 289 mg/dL — AB (ref 70–99)
Glucose-Capillary: 389 mg/dL — ABNORMAL HIGH (ref 70–99)
Glucose-Capillary: 146 mg/dL — ABNORMAL HIGH (ref 70–99)

## 2018-05-29 LAB — MRSA PCR SCREENING: MRSA by PCR: NEGATIVE

## 2018-05-29 LAB — LACTIC ACID, PLASMA
Lactic Acid, Venous: 2.1 mmol/L (ref 0.5–1.9)
Lactic Acid, Venous: 1.5 mmol/L (ref 0.5–1.9)

## 2018-05-29 LAB — GLUCOSE, CAPILLARY
Glucose-Capillary: 82 mg/dL (ref 70–99)
Glucose-Capillary: 117 mg/dL — ABNORMAL HIGH (ref 70–99)

## 2018-05-29 LAB — TROPONIN I
Troponin I: <0.03 ng/mL (ref <0.03)
Troponin I: <0.03 ng/mL (ref <0.03)

## 2018-05-29 LAB — ABO/RH: ABO/RH(D): O POS

## 2018-05-29 MED ORDER — VANCOMYCIN HCL 10 G IV SOLR
1250.0000 mg | Freq: Once | INTRAVENOUS | Status: AC
  Administered 2018-05-29: 1250 mg via INTRAVENOUS
  Filled 2018-05-29: qty 1250

## 2018-05-29 MED ORDER — PIPERACILLIN-TAZOBACTAM 3.375 G IVPB
3.3750 g | Freq: Three times a day (TID) | INTRAVENOUS | Status: DC
  Administered 2018-05-29 – 2018-06-01 (×9): 3.375 g via INTRAVENOUS
  Filled 2018-05-29 (×6): qty 50

## 2018-05-29 MED ORDER — ONDANSETRON HCL 4 MG/2ML IJ SOLN: 4.0000 mg | Freq: Four times a day (QID) | INTRAMUSCULAR | Status: DC | PRN

## 2018-05-29 MED ORDER — INSULIN GLARGINE 100 UNIT/ML ~~LOC~~ SOLN
20.0000 [IU] | Freq: Every day | SUBCUTANEOUS | Status: DC
  Administered 2018-05-29 (×2): 20 [IU] via SUBCUTANEOUS
  Filled 2018-05-29 (×3): qty 0.2

## 2018-05-29 MED ORDER — IPRATROPIUM-ALBUTEROL 0.5-2.5 (3) MG/3ML IN SOLN: 3.0000 mL | Freq: Four times a day (QID) | RESPIRATORY_TRACT | Status: DC | PRN

## 2018-05-29 MED ORDER — SODIUM CHLORIDE 0.9 % IV SOLN
INTRAVENOUS | Status: AC
  Administered 2018-05-29: 03:00:00 via INTRAVENOUS

## 2018-05-29 MED ORDER — ENSURE ENLIVE PO LIQD
237.0000 mL | Freq: Two times a day (BID) | ORAL | Status: DC
  Administered 2018-05-30 – 2018-05-31 (×3): 237 mL via ORAL

## 2018-05-29 MED ORDER — VANCOMYCIN HCL 500 MG IV SOLR
500.0000 mg | Freq: Two times a day (BID) | INTRAVENOUS | Status: DC
  Administered 2018-05-30 – 2018-05-31 (×3): 500 mg via INTRAVENOUS
  Filled 2018-05-29 (×6): qty 500

## 2018-05-29 MED ORDER — INSULIN ASPART 100 UNIT/ML ~~LOC~~ SOLN
0.0000 [IU] | Freq: Three times a day (TID) | SUBCUTANEOUS | Status: DC
  Administered 2018-05-29: 5 [IU] via SUBCUTANEOUS
  Administered 2018-05-29: 1 [IU] via SUBCUTANEOUS
  Administered 2018-05-30: 3 [IU] via SUBCUTANEOUS
  Administered 2018-05-31: 2 [IU] via SUBCUTANEOUS
  Administered 2018-05-31: 3 [IU] via SUBCUTANEOUS
  Administered 2018-05-31: 2 [IU] via SUBCUTANEOUS
  Administered 2018-06-01: 3 [IU] via SUBCUTANEOUS
  Filled 2018-05-29 (×2): qty 1

## 2018-05-29 MED ORDER — MORPHINE SULFATE (PF) 2 MG/ML IV SOLN: 1.0000 mg | INTRAVENOUS | Status: DC | PRN

## 2018-05-29 MED ORDER — PNEUMOCOCCAL VAC POLYVALENT 25 MCG/0.5ML IJ INJ
0.5000 mL | INJECTION | INTRAMUSCULAR | Status: AC
  Administered 2018-05-30: 0.5 mL via INTRAMUSCULAR
  Filled 2018-05-29: qty 0.5

## 2018-05-29 NOTE — ED Notes (Signed)
Family at bedside. 

## 2018-05-29 NOTE — Progress Notes (Signed)
Patient admitted to 5W 01. Patient placed on MO5 and is running SR. Patient placed on bed alarm. Alert and oriented x 3. Disoriented to situation. Will continue to monitor.

## 2018-05-29 NOTE — ED Notes (Signed)
Date and time results received: 05/29/18 12:00 PM  (use smartphrase ".now" to insert current time)  Test: Lactic acid  Critical Value: 2.1  Name of Provider Notified: Paged [facility]  Orders Received? Or Actions Taken?: Actions Taken: paged MD

## 2018-05-29 NOTE — Progress Notes (Signed)
Pharmacy Antibiotic Note  Crystal Moore is a 72 y.o. female admitted on 05/28/2018 with intra abdominal infection.  Pharmacy has been consulted for zosyn dosing.  Plan: Zosyn 3.375g IV q8h (4 hour infusion).  Weight: 152 lb 1.9 oz (69 kg)  Temp (24hrs), Avg:97.7 F (36.5 C), Min:97.7 F (36.5 C), Max:97.7 F (36.5 C)  Recent Labs  Lab 05/28/18 1905 05/28/18 1909  WBC 18.2*  --   CREATININE  --  0.70    CrCl cannot be calculated (Unknown ideal weight.).    No Known Allergies   Thank you for allowing pharmacy to be a part of this patient's care.  Excell Seltzer P[place]et 05/29/2018 1:09 AM

## 2018-05-29 NOTE — Progress Notes (Signed)
PROGRESS NOTE    Joleena Weisenburger  QBH:[id number] DOB: February 23, 1946 DOA: 05/28/2018 PCP: System, Pcp Not In  Outpatient Specialists:   Brief Narrative:   Arasely Akkerman is a 72 y.o. female with medical history significant of hypertension, type 2 diabetes, seizure, previous stroke with residual left-sided deficits presenting to the hospital for evaluation of left-sided flaccidity, hypotension, nausea and vomiting, urinary fecal incontinence, left lower lobe infiltrate and altered mental status.  There was initial concern for possible acute CVA versus seizure, however, neurology team has advised for patient to be monitored for now.  GI team is worried about possible ischemic colitis.  Troponin is less than 0.03.  Lactic acid is elevated to around 2.  Procalcitonin level is greater than 8.  CT scan of the abdomen and pelvis done also revealed left lower lobe infiltrate.  She is at risk for aspiration, considering history of nausea and vomiting.  GI input is to being awaited.   Assessment & Plan:   Principal Problem:   Ischemic colitis ([place]) Active Problems:   Sepsis ([place])   Hematochezia   Acute encephalopathy   Type 2 diabetes mellitus ([place])   Hypertension   Depression   COPD (chronic obstructive pulmonary disease) ([place])   Sepsis possible secondary to pneumonia:  Patient is at risk of aspiration. Continue IV cefepime. Check nasal swab for MRSA. Low threshold to add IV vancomycin. Checks x-ray in the morning. Continue to monitor pending cultures and CBC. Leukocytosis persists.   Suspected ischemic colitis, Lactic acid is elevated. Patient was hypotensive at some point. Continue to monitor supportively for now. Await GI input.  Nausea, vomiting and diarrhea: Seems to have resolved significantly. Manage supportively. Keep patient hydrated, but cautiously.  Rule out seizure/Acute CVA: Family is reported to report that patient is at her baseline in terms of neuro deficit.    Acute  encephalopathy: Resolved.  Type 2 diabetes  -Lantus 20 units at bedtime  -SSI-S  Hypertension -Continue to hold Home medication.  Depression -Hold home Paxil at this time; patient is n.p.o.  COPD -Historical  DVT prophylaxis: SCDs Code Status: Full Family Communication: Son. Disposition Plan: Will depend on hospital course. Consults called: Gastroenterology (Dr. Alessandra Bevels), neurology (Dr. Leonel Ramsay)  Procedures:   None  Antimicrobials:   IV Zosyn  Low threshold to add IV Vancomycin    Subjective: No new changes Asking for food No fever or chills reported N/V seem to have resolved significantly  Objective: Vitals:   05/29/18 1030 05/29/18 1100 05/29/18 1145 05/29/18 1200  BP: (!) 106/52 (!) 113/54 (!) 122/54 (!) 105/52  Pulse: 73 74 72 94  Resp:   17 15  Temp:    98.8 F (37.1 C)  TempSrc:    Oral  SpO2: 99% 98% 96% 100%  Weight:        Intake/Output Summary (Last 24 hours) at 05/29/2018 1503 Last data filed at 05/29/2018 1325 Gross per 24 hour  Intake 1000 ml  Output -  Net 1000 ml   Filed Weights   05/28/18 1935  Weight: 69 kg    Examination:  General exam: Appears calm and comfortable  Respiratory system: Clear to auscultation. Respiratory effort normal. Cardiovascular system: S1 & S2 heard  Gastrointestinal system: Abdomen is nondistended, soft and nontender. No organomegaly or masses felt. Normal bowel sounds heard. Central nervous system: Left sided Hemiparesis (LUE worse than LLE). Awake and alert.A Extremities: No leg edema.  Data Reviewed: I have personally reviewed following labs and imaging studies  CBC: Recent  Labs  Lab 05/28/18 1905 05/28/18 1909 05/29/18 0136 05/29/18 0339 05/29/18 1005  WBC 18.2*  --  18.7* 18.7* 16.5*  NEUTROABS 11.4*  --   --   --   --   HGB 14.2 16.0* 14.9 13.9 13.0  HCT 46.3* 47.0* 48.8* 45.4 41.3  MCV 84.0  --  84.6 85.0 84.1  PLT 189  --  193 209 660   Basic Metabolic Panel: Recent  Labs  Lab 05/28/18 1909  NA 140  K 3.8  CL 104  GLUCOSE 278*  BUN 12  CREATININE 0.70   GFR: CrCl cannot be calculated (Unknown ideal weight.). Liver Function Tests: Recent Labs  Lab 05/29/18 0136  AST 20  ALT 16  ALKPHOS 101  BILITOT 0.7  PROT 6.8  ALBUMIN 3.4*   No results for input(s): LIPASE, AMYLASE in the last 168 hours. No results for input(s): AMMONIA in the last 168 hours. Coagulation Profile: Recent Labs  Lab 05/28/18 1905  INR 1.22   Cardiac Enzymes: Recent Labs  Lab 05/29/18 1053  TROPONINI <0.03   BNP (last 3 results) No results for input(s): PROBNP in the last 8760 hours. HbA1C: No results for input(s): HGBA1C in the last 72 hours. CBG: Recent Labs  Lab 05/28/18 1819 05/29/18 0151 05/29/18 0811 05/29/18 1143  GLUCAP 242* 389* 289* 146*   Lipid Profile: No results for input(s): CHOL, HDL, LDLCALC, TRIG, CHOLHDL, LDLDIRECT in the last 72 hours. Thyroid Function Tests: No results for input(s): TSH, T4TOTAL, FREET4, T3FREE, THYROIDAB in the last 72 hours. Anemia Panel: No results for input(s): VITAMINB12, FOLATE, FERRITIN, TIBC, IRON, RETICCTPCT in the last 72 hours. Urine analysis: No results found for: COLORURINE, APPEARANCEUR, LABSPEC, PHURINE, GLUCOSEU, HGBUR, BILIRUBINUR, KETONESUR, PROTEINUR, UROBILINOGEN, NITRITE, LEUKOCYTESUR Sepsis Labs: @LABRCNTIP (procalcitonin:4,lacticidven:4)  )No results found for this or any previous visit (from the past 240 hour(s)).       Radiology Studies: Ct Angio Head W Or Wo Contrast  Result Date: 05/28/2018 CLINICAL DATA:  Code stroke, left-sided weakness EXAM: CT ANGIOGRAPHY HEAD AND NECK TECHNIQUE: Multidetector CT imaging of the head and neck was performed using the standard protocol during bolus administration of intravenous contrast. Multiplanar CT image reconstructions and MIPs were obtained to evaluate the vascular anatomy. Carotid stenosis measurements (when applicable) are obtained utilizing  NASCET criteria, using the distal internal carotid diameter as the denominator. CONTRAST:  19mL ISOVUE-370 IOPAMIDOL (ISOVUE-370) INJECTION 76% COMPARISON:  CT head 05/28/2018 FINDINGS: CTA NECK FINDINGS Aortic arch: Atherosclerotic aorta. Bovine branching pattern. Atherosclerotic disease in the proximal great vessels. Extensive atherosclerotic disease left subclavian artery with mild-to-moderate stenosis. This is present beginning at the level the left vertebral artery extending distally. Right carotid system: Right common carotid artery widely patent. Atherosclerotic calcification right carotid bifurcation. 70% diameter stenosis proximal right internal carotid artery due to calcific stenosis. Remainder of the internal carotid artery patent Left carotid system: Mild atherosclerotic calcification left common carotid artery which is widely patent. Atherosclerotic disease left carotid bifurcation. 25% diameter stenosis left internal carotid artery origin. Vertebral arteries: Occlusion of the left vertebral artery with reconstitution at approximately C4. Small distal left vertebral artery is patent to the basilar. Right vertebral artery is dominant and widely patent to the basilar. Skeleton: No acute skeletal abnormality. Other neck: Goiter.  Negative for mass or adenopathy. Upper chest: Apical emphysema and scarring. No acute infiltrate or mass. Review of the MIP images confirms the above findings CTA HEAD FINDINGS Anterior circulation: Atherosclerotic calcification in the cavernous carotid bilaterally with mild stenosis  bilaterally. Anterior and middle cerebral arteries are patent bilaterally without occlusion. There is diffuse atherosclerotic disease in the middle cerebral artery branches bilaterally. Mild diffuse stenosis left M1 segment. Mild atherosclerotic disease in the distal anterior cerebral artery branches bilaterally. Posterior circulation: Both vertebral arteries patent to the basilar. Basilar widely  patent. Superior cerebellar and posterior cerebral arteries patent bilaterally. Mild atherosclerotic irregularity and stenosis in the posterior cerebral arteries bilaterally. Venous sinuses: Limited evaluation due to timing of the scan with limited venous contrast Anatomic variants: None Delayed phase: Not performed Review of the MIP images confirms the above findings IMPRESSION: 1. 70% diameter stenosis proximal right internal carotid artery due to calcific stenosis. 2. 25% stenosis left internal carotid origin due to calcific stenosis. 3. Occlusion of non dominant left vertebral artery proximally with reconstitution. Occlusion is of indeterminate age. Right vertebral artery widely patent 4. Diffuse intracranial atherosclerotic disease. 5. Negative for emergent large vessel occlusion. Electronically Signed   By: Franchot Gallo M.D.   On: 05/28/2018 19:04   Ct Angio Neck W Or Wo Contrast  Result Date: 05/28/2018 CLINICAL DATA:  Code stroke, left-sided weakness EXAM: CT ANGIOGRAPHY HEAD AND NECK TECHNIQUE: Multidetector CT imaging of the head and neck was performed using the standard protocol during bolus administration of intravenous contrast. Multiplanar CT image reconstructions and MIPs were obtained to evaluate the vascular anatomy. Carotid stenosis measurements (when applicable) are obtained utilizing NASCET criteria, using the distal internal carotid diameter as the denominator. CONTRAST:  31mL ISOVUE-370 IOPAMIDOL (ISOVUE-370) INJECTION 76% COMPARISON:  CT head 05/28/2018 FINDINGS: CTA NECK FINDINGS Aortic arch: Atherosclerotic aorta. Bovine branching pattern. Atherosclerotic disease in the proximal great vessels. Extensive atherosclerotic disease left subclavian artery with mild-to-moderate stenosis. This is present beginning at the level the left vertebral artery extending distally. Right carotid system: Right common carotid artery widely patent. Atherosclerotic calcification right carotid bifurcation.  70% diameter stenosis proximal right internal carotid artery due to calcific stenosis. Remainder of the internal carotid artery patent Left carotid system: Mild atherosclerotic calcification left common carotid artery which is widely patent. Atherosclerotic disease left carotid bifurcation. 25% diameter stenosis left internal carotid artery origin. Vertebral arteries: Occlusion of the left vertebral artery with reconstitution at approximately C4. Small distal left vertebral artery is patent to the basilar. Right vertebral artery is dominant and widely patent to the basilar. Skeleton: No acute skeletal abnormality. Other neck: Goiter.  Negative for mass or adenopathy. Upper chest: Apical emphysema and scarring. No acute infiltrate or mass. Review of the MIP images confirms the above findings CTA HEAD FINDINGS Anterior circulation: Atherosclerotic calcification in the cavernous carotid bilaterally with mild stenosis bilaterally. Anterior and middle cerebral arteries are patent bilaterally without occlusion. There is diffuse atherosclerotic disease in the middle cerebral artery branches bilaterally. Mild diffuse stenosis left M1 segment. Mild atherosclerotic disease in the distal anterior cerebral artery branches bilaterally. Posterior circulation: Both vertebral arteries patent to the basilar. Basilar widely patent. Superior cerebellar and posterior cerebral arteries patent bilaterally. Mild atherosclerotic irregularity and stenosis in the posterior cerebral arteries bilaterally. Venous sinuses: Limited evaluation due to timing of the scan with limited venous contrast Anatomic variants: None Delayed phase: Not performed Review of the MIP images confirms the above findings IMPRESSION: 1. 70% diameter stenosis proximal right internal carotid artery due to calcific stenosis. 2. 25% stenosis left internal carotid origin due to calcific stenosis. 3. Occlusion of non dominant left vertebral artery proximally with  reconstitution. Occlusion is of indeterminate age. Right vertebral artery widely patent 4. Diffuse  intracranial atherosclerotic disease. 5. Negative for emergent large vessel occlusion. Electronically Signed   By: Franchot Gallo M.D.   On: 05/28/2018 19:04   Ct Abdomen Pelvis W Contrast  Result Date: 05/28/2018 CLINICAL DATA:  Bowel incontinence falling witness seizure. Vomiting. EXAM: CT ABDOMEN AND PELVIS WITH CONTRAST TECHNIQUE: Multidetector CT imaging of the abdomen and pelvis was performed using the standard protocol following bolus administration of intravenous contrast. CONTRAST:  165mL OMNIPAQUE IOHEXOL 300 MG/ML  SOLN COMPARISON:  None. FINDINGS: Lower chest: Mild opacity in the left base on series 4, image 1. Atelectasis or scar in the right base. No other acute abnormalities. Hepatobiliary: No focal liver abnormality is seen. No gallstones, gallbladder wall thickening, or biliary dilatation. Pancreas: Unremarkable. No pancreatic ductal dilatation or surrounding inflammatory changes. Spleen: Normal in size without focal abnormality. Adrenals/Urinary Tract: Right renal cysts. No suspicious masses. The ureters and bladder are normal. The adrenal glands are unremarkable. Stomach/Bowel: The stomach and small bowel are normal. The colon is not well distended distally. There are scattered colonic diverticuli without focal diverticulitis. The wall of the distal transverse and descending colon is prominent without focal adjacent fat stranding. Remainder of the colon is normal. The appendix is not visualized but there is no secondary evidence of appendicitis. Vascular/Lymphatic: Atherosclerotic changes are seen in the nonaneurysmal aorta. No adenopathy. Reproductive: Status post hysterectomy. No adnexal masses. Other: No abdominal wall hernia or abnormality. No abdominopelvic ascites. Musculoskeletal: There is a compression fracture of L1 with 60% loss of anterior height. This is age indeterminate. IMPRESSION:  1. Mild opacity at the left lung base could could represent atelectasis or subtle infiltrate. Recommend clinical correlation and follow-up to resolution. 2. The wall of the colon is thickened from the distal transverse colon through the descending colon without significant adjacent fat stranding. While it is possible the apparent wall thickening could be due to poor distention, colitis should be considered. Recommend clinical correlation. 3. Scattered colonic diverticuli without diverticulitis. 4. Atherosclerotic changes in the nonaneurysmal aorta. 5. Age-indeterminate compression fracture of L1 with 60% loss of anterior height. Recommend clinical correlation. Electronically Signed   By: Dorise Bullion III M.D   On: 05/28/2018 22:51   Ct Head Code Stroke Wo Contrast  Result Date: 05/28/2018 CLINICAL DATA:  Code stroke.  Left-sided weakness. EXAM: CT HEAD WITHOUT CONTRAST TECHNIQUE: Contiguous axial images were obtained from the base of the skull through the vertex without intravenous contrast. COMPARISON:  None. FINDINGS: Brain: There is no mass, hemorrhage or extra-axial collection. There is generalized atrophy without lobar predilection. There are old bilateral cerebellar infarcts and old small vessel infarcts of both basal ganglia and the right pons. There is diffuse hypoattenuation of the periventricular white matter consistent with chronic ischemic microangiopathy. Carotid Vascular: No abnormal hyperdensity of the major intracranial arteries or dural venous sinuses. No intracranial atherosclerosis. Skull: The visualized skull base, calvarium and extracranial soft tissues are normal. Sinuses/Orbits: No fluid levels or advanced mucosal thickening of the visualized paranasal sinuses. No mastoid or middle ear effusion. The orbits are normal. ASPECTS [facility] Stroke Program Early CT Score) - Ganglionic level infarction (caudate, lentiform nuclei, internal capsule, insula, M1-M3 cortex): 7 - Supraganglionic  infarction (M4-M6 cortex): 3 Total score (0-10 with 10 being normal): 10 IMPRESSION: 1. No hemorrhage or mass effect. 2. ASPECTS is 10. 3. Chronic ischemic microangiopathy, atrophy and multiple old infarcts. These results were communicated to Dr. Roland Rack at 6:37 pm on 05/28/2018 by text page via the [facility] messaging system. Electronically Signed  By: Ulyses Jarred M.D.   On: 05/28/2018 18:38        Scheduled Meds: . insulin aspart  0-9 Units Subcutaneous TID WC  . insulin glargine  20 Units Subcutaneous QHS   Continuous Infusions: . piperacillin-tazobactam (ZOSYN)  IV 3.375 g (05/29/18 1446)     LOS: 0 days    Time spent: [id number]   Dana Allan, MD  [facility] Pager #: [phone] 7PM-7AM contact night coverage as above

## 2018-05-29 NOTE — Consult Note (Signed)
Referring Provider: Dr. Marlowe Sax Primary Care Physician:  System, Pcp Not In Primary Gastroenterologist: Althia Forts  Reason for Consultation: Rectal bleeding, abnormal CT scan showing colitis  HPI: Crystal Moore is a 72 y.o. female with past medical history of CVA with residual left-sided deficit, seizure disorder and diabetes was brought to the emergency room for further evaluation of fall with possible seizure activity.  She was found to be hypotensive with blood pressure of 50/30 upon initial evaluation by EMS.  Stroke was ruled out by neurology.  Patient subsequently had episode of rectal bleeding.  CT abdomen pelvis with IV contrast showed thickening of the distal transverse colon and descending colon without significant adjacent fat stranding.Hemoglobin is normal at 13.9.  GI is consulted for further evaluation.    Patient seen and examined at bedside.  Family at bedside.  Family  denied any previous history of GI bleeding.  Denied any abdominal pain, diarrhea or constipation prior to today's events.  No previous colonoscopy.  Past Medical History:  Diagnosis Date  . Arthritis   . COPD (chronic obstructive pulmonary disease) ([place])   . CVA (cerebral vascular accident) ([place])   . Diabetes ([place])   . Hemiparesis affecting left side as late effect of cerebrovascular accident (CVA) ([place])   . Pyelonephritis   . Seizures ([place])   . Tobacco use   . UTI (urinary tract infection)     Past Surgical History:  Procedure Laterality Date  . TUBAL LIGATION      Prior to Admission medications   Medication Sig Start Date End Date Taking? Authorizing Provider  amLODipine-benazepril (LOTREL) 5-20 MG capsule Take 1 capsule by mouth daily.   Yes [provider]  aspirin EC 81 MG tablet Take 81 mg by mouth daily.   Yes [provider]  Fluticasone-Umeclidin-Vilant (TRELEGY ELLIPTA) 100-62.5-25 MCG/INH AEPB Inhale 1 puff into the lungs daily.   Yes [provider]  glipiZIDE  (GLUCOTROL XL) 5 MG 24 hr tablet Take 5 mg by mouth 2 (two) times daily.   Yes [provider]  Insulin Glargine (LANTUS SOLOSTAR) 100 UNIT/ML Solostar Pen Inject 50 Units into the skin daily before breakfast.   Yes [provider]  metFORMIN (GLUCOPHAGE) 500 MG tablet Take 500 mg by mouth 2 (two) times daily.   Yes [provider]  naproxen sodium (ALEVE) 220 MG tablet Take 220 mg by mouth 2 (two) times daily as needed (for pain).   Yes [provider]  PARoxetine (PAXIL) 10 MG tablet Take 10 mg by mouth daily.   Yes [provider]    Scheduled Meds: . insulin aspart  0-9 Units Subcutaneous TID WC  . insulin glargine  20 Units Subcutaneous QHS   Continuous Infusions: . sodium chloride 100 mL/hr at 05/29/18 0246   PRN Meds:.ipratropium-albuterol, morphine injection, ondansetron (ZOFRAN) IV  Allergies as of 05/28/2018  . (No Known Allergies)    Family History  Problem Relation Age of Onset  . Diabetes Mother   . Diabetes Sister   . Diabetes Brother     Social History   Socioeconomic History  . Marital status: Single    Spouse name: Not on file  . Number of children: Not on file  . Years of education: Not on file  . Highest education level: Not on file  Occupational History  . Not on file  Social Needs  . Financial resource strain: Not on file  . Food insecurity:    Worry: Not on file  Inability: Not on file  . Transportation needs:    Medical: Not on file    Non-medical: Not on file  Tobacco Use  . Smoking status: Current Every Day Smoker    Packs/day: 1.00    Years: 40.00    Pack years: 40.00    Types: Cigarettes  Substance and Sexual Activity  . Alcohol use: Never    Frequency: Never  . Drug use: Never  . Sexual activity: Not on file  Lifestyle  . Physical activity:    Days per week: Not on file    Minutes per session: Not on file  . Stress: Not on file  Relationships  . Social connections:    Talks on  phone: Not on file    Gets together: Not on file    Attends religious service: Not on file    Active member of club or organization: Not on file    Attends meetings of clubs or organizations: Not on file    Relationship status: Not on file  . Intimate partner violence:    Fear of current or ex partner: Not on file    Emotionally abused: Not on file    Physically abused: Not on file    Forced sexual activity: Not on file  Other Topics Concern  . Not on file  Social History Narrative  . Not on file    Review of Systems: Review of Systems  Constitutional: Negative for chills and fever.  HENT: Positive for hearing loss. Negative for tinnitus.   Respiratory: Negative for cough and hemoptysis.   Cardiovascular: Negative for chest pain and palpitations.  Gastrointestinal: Positive for blood in stool, heartburn and nausea. Negative for constipation.  Neurological: Positive for dizziness and focal weakness.  Psychiatric/Behavioral: Negative for hallucinations and suicidal ideas.    Physical Exam: Vital signs: Vitals:   05/29/18 0730 05/29/18 0745  BP: (!) 113/48   Pulse: 77 74  Resp: 15   Temp:    SpO2: 93% 95%     General:   Alert,  Well-developed, well-nourished, pleasant and cooperative in NAD HEENT : NS, AT, EOMI, oropharynx clear and moist Lungs:  Clear throughout to auscultation.   No wheezes, crackles, or rhonchi. No acute distress. Heart: RRR.  Faint murmur Abdomen: Abdomen soft, nontender, nondistended, bowel sounds present.   Rectal:  Deferred Neuro.  Alert oriented x3.  Residual left-sided weakness Psych.  Mood and affect normal.  GI:  Lab Results: Recent Labs    05/28/18 1905 05/28/18 1909 05/29/18 0136 05/29/18 0339  WBC 18.2*  --  18.7* 18.7*  HGB 14.2 16.0* 14.9 13.9  HCT 46.3* 47.0* 48.8* 45.4  PLT 189  --  193 209   BMET Recent Labs    05/28/18 1909  NA 140  K 3.8  CL 104  GLUCOSE 278*  BUN 12  CREATININE 0.70   LFT Recent Labs     05/29/18 0136  PROT 6.8  ALBUMIN 3.4*  AST 20  ALT 16  ALKPHOS 101  BILITOT 0.7  BILIDIR 0.2  IBILI 0.5   PT/INR Recent Labs    05/28/18 1905  LABPROT 15.3*  INR 1.22     Studies/Results: Ct Angio Head W Or Wo Contrast  Result Date: 05/28/2018 CLINICAL DATA:  Code stroke, left-sided weakness EXAM: CT ANGIOGRAPHY HEAD AND NECK TECHNIQUE: Multidetector CT imaging of the head and neck was performed using the standard protocol during bolus administration of intravenous contrast. Multiplanar CT image reconstructions and MIPs were obtained to  evaluate the vascular anatomy. Carotid stenosis measurements (when applicable) are obtained utilizing NASCET criteria, using the distal internal carotid diameter as the denominator. CONTRAST:  65mL ISOVUE-370 IOPAMIDOL (ISOVUE-370) INJECTION 76% COMPARISON:  CT head 05/28/2018 FINDINGS: CTA NECK FINDINGS Aortic arch: Atherosclerotic aorta. Bovine branching pattern. Atherosclerotic disease in the proximal great vessels. Extensive atherosclerotic disease left subclavian artery with mild-to-moderate stenosis. This is present beginning at the level the left vertebral artery extending distally. Right carotid system: Right common carotid artery widely patent. Atherosclerotic calcification right carotid bifurcation. 70% diameter stenosis proximal right internal carotid artery due to calcific stenosis. Remainder of the internal carotid artery patent Left carotid system: Mild atherosclerotic calcification left common carotid artery which is widely patent. Atherosclerotic disease left carotid bifurcation. 25% diameter stenosis left internal carotid artery origin. Vertebral arteries: Occlusion of the left vertebral artery with reconstitution at approximately C4. Small distal left vertebral artery is patent to the basilar. Right vertebral artery is dominant and widely patent to the basilar. Skeleton: No acute skeletal abnormality. Other neck: Goiter.  Negative for mass or  adenopathy. Upper chest: Apical emphysema and scarring. No acute infiltrate or mass. Review of the MIP images confirms the above findings CTA HEAD FINDINGS Anterior circulation: Atherosclerotic calcification in the cavernous carotid bilaterally with mild stenosis bilaterally. Anterior and middle cerebral arteries are patent bilaterally without occlusion. There is diffuse atherosclerotic disease in the middle cerebral artery branches bilaterally. Mild diffuse stenosis left M1 segment. Mild atherosclerotic disease in the distal anterior cerebral artery branches bilaterally. Posterior circulation: Both vertebral arteries patent to the basilar. Basilar widely patent. Superior cerebellar and posterior cerebral arteries patent bilaterally. Mild atherosclerotic irregularity and stenosis in the posterior cerebral arteries bilaterally. Venous sinuses: Limited evaluation due to timing of the scan with limited venous contrast Anatomic variants: None Delayed phase: Not performed Review of the MIP images confirms the above findings IMPRESSION: 1. 70% diameter stenosis proximal right internal carotid artery due to calcific stenosis. 2. 25% stenosis left internal carotid origin due to calcific stenosis. 3. Occlusion of non dominant left vertebral artery proximally with reconstitution. Occlusion is of indeterminate age. Right vertebral artery widely patent 4. Diffuse intracranial atherosclerotic disease. 5. Negative for emergent large vessel occlusion. Electronically Signed   By: Franchot Gallo M.D.   On: 05/28/2018 19:04   Ct Angio Neck W Or Wo Contrast  Result Date: 05/28/2018 CLINICAL DATA:  Code stroke, left-sided weakness EXAM: CT ANGIOGRAPHY HEAD AND NECK TECHNIQUE: Multidetector CT imaging of the head and neck was performed using the standard protocol during bolus administration of intravenous contrast. Multiplanar CT image reconstructions and MIPs were obtained to evaluate the vascular anatomy. Carotid stenosis  measurements (when applicable) are obtained utilizing NASCET criteria, using the distal internal carotid diameter as the denominator. CONTRAST:  45mL ISOVUE-370 IOPAMIDOL (ISOVUE-370) INJECTION 76% COMPARISON:  CT head 05/28/2018 FINDINGS: CTA NECK FINDINGS Aortic arch: Atherosclerotic aorta. Bovine branching pattern. Atherosclerotic disease in the proximal great vessels. Extensive atherosclerotic disease left subclavian artery with mild-to-moderate stenosis. This is present beginning at the level the left vertebral artery extending distally. Right carotid system: Right common carotid artery widely patent. Atherosclerotic calcification right carotid bifurcation. 70% diameter stenosis proximal right internal carotid artery due to calcific stenosis. Remainder of the internal carotid artery patent Left carotid system: Mild atherosclerotic calcification left common carotid artery which is widely patent. Atherosclerotic disease left carotid bifurcation. 25% diameter stenosis left internal carotid artery origin. Vertebral arteries: Occlusion of the left vertebral artery with reconstitution at approximately C4. Small  distal left vertebral artery is patent to the basilar. Right vertebral artery is dominant and widely patent to the basilar. Skeleton: No acute skeletal abnormality. Other neck: Goiter.  Negative for mass or adenopathy. Upper chest: Apical emphysema and scarring. No acute infiltrate or mass. Review of the MIP images confirms the above findings CTA HEAD FINDINGS Anterior circulation: Atherosclerotic calcification in the cavernous carotid bilaterally with mild stenosis bilaterally. Anterior and middle cerebral arteries are patent bilaterally without occlusion. There is diffuse atherosclerotic disease in the middle cerebral artery branches bilaterally. Mild diffuse stenosis left M1 segment. Mild atherosclerotic disease in the distal anterior cerebral artery branches bilaterally. Posterior circulation: Both  vertebral arteries patent to the basilar. Basilar widely patent. Superior cerebellar and posterior cerebral arteries patent bilaterally. Mild atherosclerotic irregularity and stenosis in the posterior cerebral arteries bilaterally. Venous sinuses: Limited evaluation due to timing of the scan with limited venous contrast Anatomic variants: None Delayed phase: Not performed Review of the MIP images confirms the above findings IMPRESSION: 1. 70% diameter stenosis proximal right internal carotid artery due to calcific stenosis. 2. 25% stenosis left internal carotid origin due to calcific stenosis. 3. Occlusion of non dominant left vertebral artery proximally with reconstitution. Occlusion is of indeterminate age. Right vertebral artery widely patent 4. Diffuse intracranial atherosclerotic disease. 5. Negative for emergent large vessel occlusion. Electronically Signed   By: Franchot Gallo M.D.   On: 05/28/2018 19:04   Ct Abdomen Pelvis W Contrast  Result Date: 05/28/2018 CLINICAL DATA:  Bowel incontinence falling witness seizure. Vomiting. EXAM: CT ABDOMEN AND PELVIS WITH CONTRAST TECHNIQUE: Multidetector CT imaging of the abdomen and pelvis was performed using the standard protocol following bolus administration of intravenous contrast. CONTRAST:  160mL OMNIPAQUE IOHEXOL 300 MG/ML  SOLN COMPARISON:  None. FINDINGS: Lower chest: Mild opacity in the left base on series 4, image 1. Atelectasis or scar in the right base. No other acute abnormalities. Hepatobiliary: No focal liver abnormality is seen. No gallstones, gallbladder wall thickening, or biliary dilatation. Pancreas: Unremarkable. No pancreatic ductal dilatation or surrounding inflammatory changes. Spleen: Normal in size without focal abnormality. Adrenals/Urinary Tract: Right renal cysts. No suspicious masses. The ureters and bladder are normal. The adrenal glands are unremarkable. Stomach/Bowel: The stomach and small bowel are normal. The colon is not well  distended distally. There are scattered colonic diverticuli without focal diverticulitis. The wall of the distal transverse and descending colon is prominent without focal adjacent fat stranding. Remainder of the colon is normal. The appendix is not visualized but there is no secondary evidence of appendicitis. Vascular/Lymphatic: Atherosclerotic changes are seen in the nonaneurysmal aorta. No adenopathy. Reproductive: Status post hysterectomy. No adnexal masses. Other: No abdominal wall hernia or abnormality. No abdominopelvic ascites. Musculoskeletal: There is a compression fracture of L1 with 60% loss of anterior height. This is age indeterminate. IMPRESSION: 1. Mild opacity at the left lung base could could represent atelectasis or subtle infiltrate. Recommend clinical correlation and follow-up to resolution. 2. The wall of the colon is thickened from the distal transverse colon through the descending colon without significant adjacent fat stranding. While it is possible the apparent wall thickening could be due to poor distention, colitis should be considered. Recommend clinical correlation. 3. Scattered colonic diverticuli without diverticulitis. 4. Atherosclerotic changes in the nonaneurysmal aorta. 5. Age-indeterminate compression fracture of L1 with 60% loss of anterior height. Recommend clinical correlation. Electronically Signed   By: Dorise Bullion III M.D   On: 05/28/2018 22:51   Ct Head Code Stroke Wo  Contrast  Result Date: 05/28/2018 CLINICAL DATA:  Code stroke.  Left-sided weakness. EXAM: CT HEAD WITHOUT CONTRAST TECHNIQUE: Contiguous axial images were obtained from the base of the skull through the vertex without intravenous contrast. COMPARISON:  None. FINDINGS: Brain: There is no mass, hemorrhage or extra-axial collection. There is generalized atrophy without lobar predilection. There are old bilateral cerebellar infarcts and old small vessel infarcts of both basal ganglia and the right pons.  There is diffuse hypoattenuation of the periventricular white matter consistent with chronic ischemic microangiopathy. Carotid Vascular: No abnormal hyperdensity of the major intracranial arteries or dural venous sinuses. No intracranial atherosclerosis. Skull: The visualized skull base, calvarium and extracranial soft tissues are normal. Sinuses/Orbits: No fluid levels or advanced mucosal thickening of the visualized paranasal sinuses. No mastoid or middle ear effusion. The orbits are normal. ASPECTS [facility] Stroke Program Early CT Score) - Ganglionic level infarction (caudate, lentiform nuclei, internal capsule, insula, M1-M3 cortex): 7 - Supraganglionic infarction (M4-M6 cortex): 3 Total score (0-10 with 10 being normal): 10 IMPRESSION: 1. No hemorrhage or mass effect. 2. ASPECTS is 10. 3. Chronic ischemic microangiopathy, atrophy and multiple old infarcts. These results were communicated to Dr. Roland Rack at 6:37 pm on 05/28/2018 by text page via the [facility] messaging system. Electronically Signed   By: Ulyses Jarred M.D.   On: 05/28/2018 18:38    Impression/Plan: -Rectal bleeding following episodes of hypotension/altered mental status.  CT scan showing mild wall thickening of the distal transverse colon and descending colon.  Most likely mild ischemic colitis.  Hemoglobin stable at 13.9. -History of CVA with residual left-sided weakness.  Recommendations ------------------------ -Continue antibiotics for total 10 days. -No further bleeding episodes since on the floor.  Hemoglobin normal. -No plan for further inpatient GI work-up.  Recommend outpatient follow-up and possible outpatient virtual colonoscopy once acute issues are resolved. -GI will sign off.  Call us back if needed    LOS: 0 days   Otis Brace  MD, [place] 05/29/2018, 10:18 AM  Contact #  [phone]

## 2018-05-29 NOTE — Progress Notes (Addendum)
Pharmacy Antibiotic Note  Crystal Moore is a 72 y.o. female admitted on 05/28/2018 with intra abdominal infection.  Pharmacy  consulted for zosyn dosing. Now to add Vancomycin for Possible aspiration pneumonia  Plan: Vancomycin 1250mg  IV x1 loading dose.   RN to call me with height, then calculate CrCl & order maintenanc dose of Vanc. Continue Zosyn 3.375g IV q8h (4 hour infusion). Monitor clinical status, renal function and culture results daily.  vanc trough at steady state.   Weight: 152 lb 1.9 oz (69 kg)  Temp (24hrs), Avg:98.3 F (36.8 C), Min:97.7 F (36.5 C), Max:98.8 F (37.1 C)  Recent Labs  Lab 05/28/18 1905 05/28/18 1909 05/29/18 0136 05/29/18 0339 05/29/18 1005 05/29/18 1053  WBC 18.2*  --  18.7* 18.7* 16.5*  --   CREATININE  --  0.70  --   --   --   --   LATICACIDVEN  --   --   --   --   --  2.1*    CrCl cannot be calculated (Unknown ideal weight.).    No Known Allergies   Thank you for allowing pharmacy to be a part of this patient's care.  Nicole Cella, RPh Clinical Pharmacist Pager: [phone] Please check AMION for all [place] phone numbers After 10:00 PM, call [place] [phone] 05/29/2018 4:04 PM    ADDENDUM:  height 62 in, wt 69 kg, SCr 0.7 , CrCl ~ 59 ml/min Goal VT = 15-20 mcg/ml  Plan: after loading dose Vancomycin will give Vanc 500mg  IV q12h  Monitor vanc trough at steady state per protocol.  Nicole Cella, RPh Clinical Pharmacist 05/29/2018 6:23 PM

## 2018-05-29 NOTE — ED Notes (Signed)
Report taken from [place]

## 2018-05-30 ENCOUNTER — Inpatient Hospital Stay (HOSPITAL_COMMUNITY): Payer: Medicare Other

## 2018-05-30 DIAGNOSIS — K559 Vascular disorder of intestine, unspecified: Secondary | ICD-10-CM

## 2018-05-30 LAB — RENAL FUNCTION PANEL
Albumin: 2.7 g/dL — ABNORMAL LOW (ref 3.5–5.0)
Anion gap: 4 — ABNORMAL LOW (ref 5–15)
BUN: 16 mg/dL (ref 8–23)
CO2: 26 mmol/L (ref 22–32)
Calcium: 8.3 mg/dL — ABNORMAL LOW (ref 8.9–10.3)
Chloride: 109 mmol/L (ref 98–111)
Creatinine, Ser: 0.91 mg/dL (ref 0.44–1.00)
GFR calc Af Amer: >60 mL/min (ref >60)
GFR calc non Af Amer: >60 mL/min (ref >60)
Glucose, Bld: 93 mg/dL (ref 70–99)
Phosphorus: 3.3 mg/dL (ref 2.5–4.6)
Potassium: 2.9 mmol/L — ABNORMAL LOW (ref 3.5–5.1)
Sodium: 139 mmol/L (ref 135–145)

## 2018-05-30 LAB — CBC WITH DIFFERENTIAL/PLATELET
Abs Immature Granulocytes: 0.03 10*3/uL (ref 0.00–0.07)
Basophils Absolute: 0.1 10*3/uL (ref 0.0–0.1)
Basophils Relative: 0 %
Eosinophils Absolute: 0.2 10*3/uL (ref 0.0–0.5)
Eosinophils Relative: 1 %
HCT: 36.7 % (ref 36.0–46.0)
Hemoglobin: 11.8 g/dL — ABNORMAL LOW (ref 12.0–15.0)
Immature Granulocytes: 0 %
Lymphocytes Relative: 24 %
Lymphs Abs: 3.2 10*3/uL (ref 0.7–4.0)
MCH: 27.1 pg (ref 26.0–34.0)
MCHC: 32.2 g/dL (ref 30.0–36.0)
MCV: 84.2 fL (ref 80.0–100.0)
Monocytes Absolute: 0.7 10*3/uL (ref 0.1–1.0)
Monocytes Relative: 6 %
Neutro Abs: 9.1 10*3/uL — ABNORMAL HIGH (ref 1.7–7.7)
Neutrophils Relative %: 69 %
Platelets: 186 10*3/uL (ref 150–400)
RBC: 4.36 MIL/uL (ref 3.87–5.11)
RDW: 14.7 % (ref 11.5–15.5)
WBC: 13.3 10*3/uL — ABNORMAL HIGH (ref 4.0–10.5)
nRBC: 0 % (ref 0.0–0.2)

## 2018-05-30 LAB — GLUCOSE, CAPILLARY
GLUCOSE-CAPILLARY: 211 mg/dL — AB (ref 70–99)
GLUCOSE-CAPILLARY: 196 mg/dL — AB (ref 70–99)
GLUCOSE-CAPILLARY: 67 mg/dL — AB (ref 70–99)
GLUCOSE-CAPILLARY: 105 mg/dL — AB (ref 70–99)
GLUCOSE-CAPILLARY: 314 mg/dL — AB (ref 70–99)
Glucose-Capillary: 139 mg/dL — ABNORMAL HIGH (ref 70–99)
Glucose-Capillary: 56 mg/dL — ABNORMAL LOW (ref 70–99)
Glucose-Capillary: 282 mg/dL — ABNORMAL HIGH (ref 70–99)
Glucose-Capillary: 209 mg/dL — ABNORMAL HIGH (ref 70–99)

## 2018-05-30 LAB — TYPE AND SCREEN
ABO/RH(D): O POS
ANTIBODY SCREEN: NEGATIVE

## 2018-05-30 LAB — PROCALCITONIN: Procalcitonin: 5.49 ng/mL

## 2018-05-30 LAB — MAGNESIUM: Magnesium: 1.7 mg/dL (ref 1.7–2.4)

## 2018-05-30 MED ORDER — MAGNESIUM SULFATE IN D5W 1-5 GM/100ML-% IV SOLN
1.0000 g | Freq: Once | INTRAVENOUS | Status: AC
  Administered 2018-05-30: 1 g via INTRAVENOUS
  Filled 2018-05-30: qty 100

## 2018-05-30 MED ORDER — INSULIN GLARGINE 100 UNIT/ML ~~LOC~~ SOLN
10.0000 [IU] | Freq: Every day | SUBCUTANEOUS | Status: DC
  Administered 2018-05-31 – 2018-06-01 (×2): 10 [IU] via SUBCUTANEOUS
  Filled 2018-05-30 (×2): qty 0.1

## 2018-05-30 MED ORDER — POTASSIUM CHLORIDE CRYS ER 20 MEQ PO TBCR
40.0000 meq | EXTENDED_RELEASE_TABLET | ORAL | Status: AC
  Administered 2018-05-30 (×2): 40 meq via ORAL
  Filled 2018-05-30 (×2): qty 2

## 2018-05-30 NOTE — Progress Notes (Signed)
This AM patient's CBG was 56. Patient asymptomatic, no complaints. She received 240 ml juice and CBG came up to 139. Will continue to monitor.

## 2018-05-30 NOTE — Progress Notes (Addendum)
This evening, patient's CBG was low - 67. Patient asymptomatic, no complaints, no acute distress noted. Patient received 240 ml juice and she asked for peanut butter and grahams crackers. CBG came up to 105. Will continue to monitor.

## 2018-05-30 NOTE — Progress Notes (Signed)
Initial Nutrition Assessment  DOCUMENTATION CODES:   Non-severe (moderate) malnutrition in context of chronic illness  INTERVENTION:   - Continue Ensure Enlive po BID, each supplement provides 350 kcal and 20 grams of protein  - Magic cup BID with meals, each supplement provides 290 kcal and 9 grams of protein  - Once diet advanced, add PM snack  Pt with moderate protein-calorie malnutrition and would benefit from nutrient dense supplement. One Ensure Enlive supplement provides 350 kcals, 20 grams protein, and 44-45 grams of carbohydrate vs one Glucerna shake supplement, which provides 220 kcals, 10 grams of protein, and 26 grams of carbohydrate. Given pt's hx of DM, RD will continue to monitor PO intake, CBGS, and adjust supplement regimen as appropriate.   NUTRITION DIAGNOSIS:   Moderate Malnutrition related to chronic illness (COPD) as evidenced by mild fat depletion, moderate fat depletion, mild muscle depletion, moderate muscle depletion.  GOAL:   Patient will meet greater than or equal to 90% of their needs  MONITOR:   PO intake, Supplement acceptance, Diet advancement, Labs, Weight trends  REASON FOR ASSESSMENT:   Malnutrition Screening Tool    ASSESSMENT:   72 year old female who presented to the ED on 11/2 with seizures and Code Stroke which was later cancelled. PMH significant for COPD, type 2 diabetes mellitus, hypertension, seizures, and previous CVA. Pt had an episode of rectal bleeding. CT showed mild wall thickening of distal transverse colon and descending colitis which is most likely ischemic colitis.  Spoke with pt at bedside.  Pt states that she has a good appetite and is looking forward to lunch. Pt states, "they aren't feeding me anything" in reference to full liquid meal trays. RD explained full liquid diet order. Pt states that she is ready for solid foods and denies any nausea or vomiting.  Pt reports that she typically eats 3 meals daily or snacks  throughout the day. Pt reports her typical appetite as "alright." Pt states that her grandson cooks for her but isn't able to specify what he might cook. A typical breakfast includes sausage, toast, and grits. Pt states, "I eat when I'm hungry."  Pt endorses weight loss over time. Pt states that her UBW was 198 lbs and that she last weighed this 1 year ago. Current weight recorded as 152 lbs. No weight history available in chart.  Pt reports that she is not sure why she is losing weight. Discussed having smaller, more frequent meals to help meet needs due to pt reporting early satiety. Discussed ways to add kcal and protein to foods.  Pt denies any issues chewing or swallowing and states that she ambulates with a walker.  Medications reviewed and include: Ensure Enlive BID, sliding scale Novolog, Lantus 20 units daily, K-dur 40 mEq q 4 hours x 2 today, IV antibiotics, magnesium sulfate 1 gram once  Labs reviewed: potassium 2.9 (L) CBG's: 139, 56, 117, 82 x 24 hours  NUTRITION - FOCUSED PHYSICAL EXAM:    Most Recent Value  Orbital Region  Mild depletion  Upper Arm Region  Moderate depletion  Thoracic and Lumbar Region  Mild depletion  Buccal Region  Mild depletion  Temple Region  Mild depletion  Clavicle Bone Region  Moderate depletion  Clavicle and Acromion Bone Region  Moderate depletion  Scapular Bone Region  Unable to assess  Dorsal Hand  Mild depletion  Patellar Region  Moderate depletion  Anterior Thigh Region  Moderate depletion  Posterior Calf Region  Moderate depletion  Edema (RD Assessment)  None  Hair  Reviewed  Eyes  Reviewed  Mouth  Reviewed  Skin  Reviewed  Nails  Reviewed       Diet Order:   Diet Order            Diet full liquid Room service appropriate? Yes; Fluid consistency: Thin  Diet effective now              EDUCATION NEEDS:   Education needs have been addressed  Skin:  Skin Assessment: Reviewed RN Assessment  Last BM:  11/4 (medium type  6)  Height:   Ht Readings from Last 1 Encounters:  05/29/18 5\' 2"  (1.575 m)    Weight:   Wt Readings from Last 1 Encounters:  05/28/18 69 kg    Ideal Body Weight:  50 kg  BMI:  Body mass index is 27.82 kg/m.  Estimated Nutritional Needs:   Kcal:  1650-1850  Protein:  80-90 grams  Fluid:  1.6-1.8 L    Gaynell Face, MS, RD, LDN Inpatient Clinical Dietitian Pager: [phone] Weekend/After Hours: [phone]

## 2018-05-30 NOTE — Progress Notes (Signed)
PROGRESS NOTE    Crystal Moore  ZJI:[id number] DOB: 1946-02-24 DOA: 05/28/2018 PCP: System, Pcp Not In  Outpatient Specialists:   Brief Narrative:   Crystal Moore is a 72 y.o. female with medical history significant of hypertension, type 2 diabetes, seizure, previous stroke with residual left-sided deficits presenting to the hospital for evaluation of left-sided flaccidity, hypotension, nausea and vomiting, urinary fecal incontinence, left lower lobe infiltrate and altered mental status.  There was initial concern for possible acute CVA versus seizure, however, neurology team has advised for patient to be monitored for now.  GI team is worried about possible ischemic colitis.  Troponin is less than 0.03.  Lactic acid is elevated to around 2.  Procalcitonin level is greater than 8.  CT scan of the abdomen and pelvis done also revealed left lower lobe infiltrate.  She is at risk for aspiration, considering history of nausea and vomiting.  GI input is to being awaited.  05/30/2018: Patient looks better today.  Chest x-ray is negative for infiltrates.  Procalcitonin is on the downward trend.  Leukocytosis is resolving.  Will proceed with blood cultures (I did not visualize any pending blood culture results).  Continue antibiotics for now.  No further GI bleed reported.  Lactic acidosis has resolved significantly.   Assessment & Plan:   Principal Problem:   Ischemic colitis ([place]) Active Problems:   Sepsis ([place])   Hematochezia   Acute encephalopathy   Type 2 diabetes mellitus ([place])   Hypertension   Depression   COPD (chronic obstructive pulmonary disease) ([place])   Sepsis possible secondary to pneumonia:  Patient is at risk of aspiration. Continue IV cefepime. Check nasal swab for MRSA. Low threshold to add IV vancomycin. Chest x-ray is negative for infiltrate.   Blood cultures x2.   Continue antibiotics.   Lactic acidosis has resolved.   Leukocytosis is resolving.   Procalcitonin is on  the downward trend.   Hypoglycemia: Low blood sugar reported earlier today (in the 50s) Patient is on subcutaneous Lantus insulin 20 units at nighttime, as well as sliding scale insulin coverage. We will hold Lantus insulin for tonight. Will resume Lantus insulin tomorrow at the dose of 10 units daily, if no further drop in blood sugar is noted. We will check blood sugar every hour for the next 4 hours. Further management will depend on hospital course. This could be part of sepsis syndrome. Hypoglycemia may have been the initial event that triggered his admission as well.  Colitis, suspected ischemic colitis, Lactic acid has resolved significantly.  Patient was hypotensive at some point. Continue to monitor supportively for now. Await GI input is appreciated.  No further GI work-up. No further GI bleed reported.  Nausea, vomiting and diarrhea: Seems to have resolved significantly. Manage supportively. Keep patient hydrated, but cautiously.  Rule out seizure/Acute CVA: Family is reported to report that patient is at her baseline in terms of neuro deficit.    Acute encephalopathy: Resolved.  Type 2 diabetes  -Lantus 20 units at bedtime  -SSI-S  Hypertension -Continue to hold Home medication.  Depression -Hold home Paxil at this time; patient is n.p.o.  COPD -Historical  DVT prophylaxis: SCDs Code Status: Full Family Communication: Son. Disposition Plan: Will depend on hospital course. Consults called: Gastroenterology (Dr. Alessandra Bevels), neurology (Dr. Leonel Ramsay)  Procedures:   None  Antimicrobials:   IV Zosyn  Low threshold to add IV Vancomycin    Subjective: No new changes Asking for food No fever or chills reported N/V seem  to have resolved significantly  Objective: Vitals:   05/30/18 0748 05/30/18 0842 05/30/18 1257 05/30/18 1315  BP:    (!) 147/76  Pulse:      Resp:    15  Temp: 98.5 F (36.9 C) 98.1 F (36.7 C) 98.4 F (36.9 C)  98.4 F (36.9 C)  TempSrc: Oral Oral Oral Oral  SpO2: 100%   99%  Weight:      Height:        Intake/Output Summary (Last 24 hours) at 05/30/2018 1558 Last data filed at 05/30/2018 1319 Gross per 24 hour  Intake 790.47 ml  Output -  Net 790.47 ml   Filed Weights   05/28/18 1935  Weight: 69 kg    Examination:  General exam: Appears calm and comfortable  Respiratory system: Clear to auscultation. Respiratory effort normal. Cardiovascular system: S1 & S2 heard  Gastrointestinal system: Abdomen is nondistended, soft and nontender. No organomegaly or masses felt. Normal bowel sounds heard. Central nervous system: Left sided Hemiparesis (LUE worse than LLE). Awake and alert.A Extremities: No leg edema.  Data Reviewed: I have personally reviewed following labs and imaging studies  CBC: Recent Labs  Lab 05/28/18 1905 05/28/18 1909 05/29/18 0136 05/29/18 0339 05/29/18 1005 05/30/18 0313  WBC 18.2*  --  18.7* 18.7* 16.5* 13.3*  NEUTROABS 11.4*  --   --   --   --  9.1*  HGB 14.2 16.0* 14.9 13.9 13.0 11.8*  HCT 46.3* 47.0* 48.8* 45.4 41.3 36.7  MCV 84.0  --  84.6 85.0 84.1 84.2  PLT 189  --  193 209 208 811   Basic Metabolic Panel: Recent Labs  Lab 05/28/18 1909 05/30/18 0313  NA 140 139  K 3.8 2.9*  CL 104 109  CO2  --  26  GLUCOSE 278* 93  BUN 12 16  CREATININE 0.70 0.91  CALCIUM  --  8.3*  MG  --  1.7  PHOS  --  3.3   GFR: Estimated Creatinine Clearance: 51.6 mL/min (by C-G formula based on SCr of 0.91 mg/dL). Liver Function Tests: Recent Labs  Lab 05/29/18 0136 05/30/18 0313  AST 20  --   ALT 16  --   ALKPHOS 101  --   BILITOT 0.7  --   PROT 6.8  --   ALBUMIN 3.4* 2.7*   No results for input(s): LIPASE, AMYLASE in the last 168 hours. No results for input(s): AMMONIA in the last 168 hours. Coagulation Profile: Recent Labs  Lab 05/28/18 1905  INR 1.22   Cardiac Enzymes: Recent Labs  Lab 05/29/18 1053 05/29/18 1632  TROPONINI <0.03 <0.03    BNP (last 3 results) No results for input(s): PROBNP in the last 8760 hours. HbA1C: No results for input(s): HGBA1C in the last 72 hours. CBG: Recent Labs  Lab 05/30/18 0928 05/30/18 1111 05/30/18 1207 05/30/18 1257 05/30/18 1355  GLUCAP 139* 282* 209* 211* 196*   Lipid Profile: No results for input(s): CHOL, HDL, LDLCALC, TRIG, CHOLHDL, LDLDIRECT in the last 72 hours. Thyroid Function Tests: No results for input(s): TSH, T4TOTAL, FREET4, T3FREE, THYROIDAB in the last 72 hours. Anemia Panel: No results for input(s): VITAMINB12, FOLATE, FERRITIN, TIBC, IRON, RETICCTPCT in the last 72 hours. Urine analysis: No results found for: COLORURINE, APPEARANCEUR, LABSPEC, [place], GLUCOSEU, HGBUR, BILIRUBINUR, KETONESUR, PROTEINUR, UROBILINOGEN, NITRITE, LEUKOCYTESUR Sepsis Labs: @LABRCNTIP (procalcitonin:4,lacticidven:4)  ) Recent Results (from the past 240 hour(s))  MRSA PCR Screening     Status: None   Collection Time: 05/29/18  3:20 PM  Result Value Ref Range Status   MRSA by PCR NEGATIVE NEGATIVE Final    Comment:        The GeneXpert MRSA Assay (FDA approved for NASAL specimens only), is one component of a comprehensive MRSA colonization surveillance program. It is not intended to diagnose MRSA infection nor to guide or monitor treatment for MRSA infections. Performed at [facility], [place] [address] [place], [place] [address]   Respiratory Panel by PCR     Status: None   Collection Time: 05/29/18  3:20 PM  Result Value Ref Range Status   Adenovirus NOT DETECTED NOT DETECTED Final   Coronavirus 229E NOT DETECTED NOT DETECTED Final   Coronavirus HKU1 NOT DETECTED NOT DETECTED Final   Coronavirus NL63 NOT DETECTED NOT DETECTED Final   Coronavirus OC43 NOT DETECTED NOT DETECTED Final   Metapneumovirus NOT DETECTED NOT DETECTED Final   Rhinovirus / Enterovirus NOT DETECTED NOT DETECTED Final   Influenza A NOT DETECTED NOT DETECTED Final   Influenza B NOT DETECTED  NOT DETECTED Final   Parainfluenza Virus 1 NOT DETECTED NOT DETECTED Final   Parainfluenza Virus 2 NOT DETECTED NOT DETECTED Final   Parainfluenza Virus 3 NOT DETECTED NOT DETECTED Final   Parainfluenza Virus 4 NOT DETECTED NOT DETECTED Final   Respiratory Syncytial Virus NOT DETECTED NOT DETECTED Final   Bordetella pertussis NOT DETECTED NOT DETECTED Final   Chlamydophila pneumoniae NOT DETECTED NOT DETECTED Final   Mycoplasma pneumoniae NOT DETECTED NOT DETECTED Final    Comment: Performed at [facility], [place] [address], [place], [place] [address]         Radiology Studies: Ct Angio Head W Or Wo Contrast  Result Date: 05/28/2018 CLINICAL DATA:  Code stroke, left-sided weakness EXAM: CT ANGIOGRAPHY HEAD AND NECK TECHNIQUE: Multidetector CT imaging of the head and neck was performed using the standard protocol during bolus administration of intravenous contrast. Multiplanar CT image reconstructions and MIPs were obtained to evaluate the vascular anatomy. Carotid stenosis measurements (when applicable) are obtained utilizing NASCET criteria, using the distal internal carotid diameter as the denominator. CONTRAST:  51mL ISOVUE-370 IOPAMIDOL (ISOVUE-370) INJECTION 76% COMPARISON:  CT head 05/28/2018 FINDINGS: CTA NECK FINDINGS Aortic arch: Atherosclerotic aorta. Bovine branching pattern. Atherosclerotic disease in the proximal great vessels. Extensive atherosclerotic disease left subclavian artery with mild-to-moderate stenosis. This is present beginning at the level the left vertebral artery extending distally. Right carotid system: Right common carotid artery widely patent. Atherosclerotic calcification right carotid bifurcation. 70% diameter stenosis proximal right internal carotid artery due to calcific stenosis. Remainder of the internal carotid artery patent Left carotid system: Mild atherosclerotic calcification left common carotid artery which is widely patent. Atherosclerotic disease  left carotid bifurcation. 25% diameter stenosis left internal carotid artery origin. Vertebral arteries: Occlusion of the left vertebral artery with reconstitution at approximately C4. Small distal left vertebral artery is patent to the basilar. Right vertebral artery is dominant and widely patent to the basilar. Skeleton: No acute skeletal abnormality. Other neck: Goiter.  Negative for mass or adenopathy. Upper chest: Apical emphysema and scarring. No acute infiltrate or mass. Review of the MIP images confirms the above findings CTA HEAD FINDINGS Anterior circulation: Atherosclerotic calcification in the cavernous carotid bilaterally with mild stenosis bilaterally. Anterior and middle cerebral arteries are patent bilaterally without occlusion. There is diffuse atherosclerotic disease in the middle cerebral artery branches bilaterally. Mild diffuse stenosis left M1 segment. Mild atherosclerotic disease in the distal anterior cerebral artery branches bilaterally. Posterior  circulation: Both vertebral arteries patent to the basilar. Basilar widely patent. Superior cerebellar and posterior cerebral arteries patent bilaterally. Mild atherosclerotic irregularity and stenosis in the posterior cerebral arteries bilaterally. Venous sinuses: Limited evaluation due to timing of the scan with limited venous contrast Anatomic variants: None Delayed phase: Not performed Review of the MIP images confirms the above findings IMPRESSION: 1. 70% diameter stenosis proximal right internal carotid artery due to calcific stenosis. 2. 25% stenosis left internal carotid origin due to calcific stenosis. 3. Occlusion of non dominant left vertebral artery proximally with reconstitution. Occlusion is of indeterminate age. Right vertebral artery widely patent 4. Diffuse intracranial atherosclerotic disease. 5. Negative for emergent large vessel occlusion. Electronically Signed   By: Franchot Gallo M.D.   On: 05/28/2018 19:04   Ct Angio Neck W  Or Wo Contrast  Result Date: 05/28/2018 CLINICAL DATA:  Code stroke, left-sided weakness EXAM: CT ANGIOGRAPHY HEAD AND NECK TECHNIQUE: Multidetector CT imaging of the head and neck was performed using the standard protocol during bolus administration of intravenous contrast. Multiplanar CT image reconstructions and MIPs were obtained to evaluate the vascular anatomy. Carotid stenosis measurements (when applicable) are obtained utilizing NASCET criteria, using the distal internal carotid diameter as the denominator. CONTRAST:  6mL ISOVUE-370 IOPAMIDOL (ISOVUE-370) INJECTION 76% COMPARISON:  CT head 05/28/2018 FINDINGS: CTA NECK FINDINGS Aortic arch: Atherosclerotic aorta. Bovine branching pattern. Atherosclerotic disease in the proximal great vessels. Extensive atherosclerotic disease left subclavian artery with mild-to-moderate stenosis. This is present beginning at the level the left vertebral artery extending distally. Right carotid system: Right common carotid artery widely patent. Atherosclerotic calcification right carotid bifurcation. 70% diameter stenosis proximal right internal carotid artery due to calcific stenosis. Remainder of the internal carotid artery patent Left carotid system: Mild atherosclerotic calcification left common carotid artery which is widely patent. Atherosclerotic disease left carotid bifurcation. 25% diameter stenosis left internal carotid artery origin. Vertebral arteries: Occlusion of the left vertebral artery with reconstitution at approximately C4. Small distal left vertebral artery is patent to the basilar. Right vertebral artery is dominant and widely patent to the basilar. Skeleton: No acute skeletal abnormality. Other neck: Goiter.  Negative for mass or adenopathy. Upper chest: Apical emphysema and scarring. No acute infiltrate or mass. Review of the MIP images confirms the above findings CTA HEAD FINDINGS Anterior circulation: Atherosclerotic calcification in the cavernous  carotid bilaterally with mild stenosis bilaterally. Anterior and middle cerebral arteries are patent bilaterally without occlusion. There is diffuse atherosclerotic disease in the middle cerebral artery branches bilaterally. Mild diffuse stenosis left M1 segment. Mild atherosclerotic disease in the distal anterior cerebral artery branches bilaterally. Posterior circulation: Both vertebral arteries patent to the basilar. Basilar widely patent. Superior cerebellar and posterior cerebral arteries patent bilaterally. Mild atherosclerotic irregularity and stenosis in the posterior cerebral arteries bilaterally. Venous sinuses: Limited evaluation due to timing of the scan with limited venous contrast Anatomic variants: None Delayed phase: Not performed Review of the MIP images confirms the above findings IMPRESSION: 1. 70% diameter stenosis proximal right internal carotid artery due to calcific stenosis. 2. 25% stenosis left internal carotid origin due to calcific stenosis. 3. Occlusion of non dominant left vertebral artery proximally with reconstitution. Occlusion is of indeterminate age. Right vertebral artery widely patent 4. Diffuse intracranial atherosclerotic disease. 5. Negative for emergent large vessel occlusion. Electronically Signed   By: Franchot Gallo M.D.   On: 05/28/2018 19:04   Ct Abdomen Pelvis W Contrast  Result Date: 05/28/2018 CLINICAL DATA:  Bowel incontinence falling witness  seizure. Vomiting. EXAM: CT ABDOMEN AND PELVIS WITH CONTRAST TECHNIQUE: Multidetector CT imaging of the abdomen and pelvis was performed using the standard protocol following bolus administration of intravenous contrast. CONTRAST:  149mL OMNIPAQUE IOHEXOL 300 MG/ML  SOLN COMPARISON:  None. FINDINGS: Lower chest: Mild opacity in the left base on series 4, image 1. Atelectasis or scar in the right base. No other acute abnormalities. Hepatobiliary: No focal liver abnormality is seen. No gallstones, gallbladder wall thickening,  or biliary dilatation. Pancreas: Unremarkable. No pancreatic ductal dilatation or surrounding inflammatory changes. Spleen: Normal in size without focal abnormality. Adrenals/Urinary Tract: Right renal cysts. No suspicious masses. The ureters and bladder are normal. The adrenal glands are unremarkable. Stomach/Bowel: The stomach and small bowel are normal. The colon is not well distended distally. There are scattered colonic diverticuli without focal diverticulitis. The wall of the distal transverse and descending colon is prominent without focal adjacent fat stranding. Remainder of the colon is normal. The appendix is not visualized but there is no secondary evidence of appendicitis. Vascular/Lymphatic: Atherosclerotic changes are seen in the nonaneurysmal aorta. No adenopathy. Reproductive: Status post hysterectomy. No adnexal masses. Other: No abdominal wall hernia or abnormality. No abdominopelvic ascites. Musculoskeletal: There is a compression fracture of L1 with 60% loss of anterior height. This is age indeterminate. IMPRESSION: 1. Mild opacity at the left lung base could could represent atelectasis or subtle infiltrate. Recommend clinical correlation and follow-up to resolution. 2. The wall of the colon is thickened from the distal transverse colon through the descending colon without significant adjacent fat stranding. While it is possible the apparent wall thickening could be due to poor distention, colitis should be considered. Recommend clinical correlation. 3. Scattered colonic diverticuli without diverticulitis. 4. Atherosclerotic changes in the nonaneurysmal aorta. 5. Age-indeterminate compression fracture of L1 with 60% loss of anterior height. Recommend clinical correlation. Electronically Signed   By: Dorise Bullion III M.D   On: 05/28/2018 22:51   Dg Chest Port 1 View  Result Date: 05/30/2018 CLINICAL DATA:  Pneumonia. EXAM: PORTABLE CHEST 1 VIEW COMPARISON:  None. FINDINGS: Lungs are  adequately inflated with mild prominence of the perihilar markings suggesting mild vascular congestion. No lobar consolidation or effusion. 5 mm nodular density over the right midlung. Cardiomediastinal silhouette is within normal. Mild calcified plaque over the aortic arch. Remainder of the exam is unremarkable. IMPRESSION: Findings suggesting minimal vascular congestion. 5 mm nodular density over the right midlung. Recommend noncontrast chest CT on an elective basis for further evaluation. Electronically Signed   By: Marin Olp M.D.   On: 05/30/2018 07:54   Ct Head Code Stroke Wo Contrast  Result Date: 05/28/2018 CLINICAL DATA:  Code stroke.  Left-sided weakness. EXAM: CT HEAD WITHOUT CONTRAST TECHNIQUE: Contiguous axial images were obtained from the base of the skull through the vertex without intravenous contrast. COMPARISON:  None. FINDINGS: Brain: There is no mass, hemorrhage or extra-axial collection. There is generalized atrophy without lobar predilection. There are old bilateral cerebellar infarcts and old small vessel infarcts of both basal ganglia and the right pons. There is diffuse hypoattenuation of the periventricular white matter consistent with chronic ischemic microangiopathy. Carotid Vascular: No abnormal hyperdensity of the major intracranial arteries or dural venous sinuses. No intracranial atherosclerosis. Skull: The visualized skull base, calvarium and extracranial soft tissues are normal. Sinuses/Orbits: No fluid levels or advanced mucosal thickening of the visualized paranasal sinuses. No mastoid or middle ear effusion. The orbits are normal. ASPECTS [facility] [place] Stroke Program Early CT Score) - Ganglionic level  infarction (caudate, lentiform nuclei, internal capsule, insula, M1-M3 cortex): 7 - Supraganglionic infarction (M4-M6 cortex): 3 Total score (0-10 with 10 being normal): 10 IMPRESSION: 1. No hemorrhage or mass effect. 2. ASPECTS is 10. 3. Chronic ischemic microangiopathy, atrophy  and multiple old infarcts. These results were communicated to Dr. Roland Rack at 6:37 pm on 05/28/2018 by text page via the [facility] messaging system. Electronically Signed   By: Ulyses Jarred M.D.   On: 05/28/2018 18:38        Scheduled Meds: . feeding supplement (ENSURE ENLIVE)  237 mL Oral BID BM  . insulin aspart  0-9 Units Subcutaneous TID WC  . [START ON 05/31/2018] insulin glargine  10 Units Subcutaneous Daily   Continuous Infusions: . piperacillin-tazobactam (ZOSYN)  IV 3.375 g (05/30/18 1318)  . vancomycin 100 mL/hr at 05/30/18 0600     LOS: 1 day    Time spent: [place], MD  [facility] Pager #: [phone] 7PM-7AM contact night coverage as above

## 2018-05-30 NOTE — Progress Notes (Signed)
Per MD verbal order we will check CBG every hour x 4. Will continue to monitor.

## 2018-05-31 DIAGNOSIS — E440 Moderate protein-calorie malnutrition: Secondary | ICD-10-CM

## 2018-05-31 LAB — GLUCOSE, CAPILLARY
GLUCOSE-CAPILLARY: 170 mg/dL — AB (ref 70–99)
Glucose-Capillary: 174 mg/dL — ABNORMAL HIGH (ref 70–99)
Glucose-Capillary: 178 mg/dL — ABNORMAL HIGH (ref 70–99)
Glucose-Capillary: 212 mg/dL — ABNORMAL HIGH (ref 70–99)
Glucose-Capillary: 239 mg/dL — ABNORMAL HIGH (ref 70–99)

## 2018-05-31 LAB — CBC WITH DIFFERENTIAL/PLATELET
Abs Immature Granulocytes: 0.02 10*3/uL (ref 0.00–0.07)
Basophils Absolute: 0 10*3/uL (ref 0.0–0.1)
Basophils Relative: 0 %
Eosinophils Absolute: 0.1 10*3/uL (ref 0.0–0.5)
Eosinophils Relative: 1 %
HCT: 39.8 % (ref 36.0–46.0)
Hemoglobin: 12.1 g/dL (ref 12.0–15.0)
Immature Granulocytes: 0 %
Lymphocytes Relative: 21 %
Lymphs Abs: 2.3 10*3/uL (ref 0.7–4.0)
MCH: 25.7 pg — ABNORMAL LOW (ref 26.0–34.0)
MCHC: 30.4 g/dL (ref 30.0–36.0)
MCV: 84.5 fL (ref 80.0–100.0)
Monocytes Absolute: 0.6 10*3/uL (ref 0.1–1.0)
Monocytes Relative: 5 %
Neutro Abs: 7.7 10*3/uL (ref 1.7–7.7)
Neutrophils Relative %: 73 %
Platelets: 203 10*3/uL (ref 150–400)
RBC: 4.71 MIL/uL (ref 3.87–5.11)
RDW: 14.4 % (ref 11.5–15.5)
WBC: 10.7 10*3/uL — ABNORMAL HIGH (ref 4.0–10.5)
nRBC: 0 % (ref 0.0–0.2)

## 2018-05-31 LAB — RENAL FUNCTION PANEL
Albumin: 2.8 g/dL — ABNORMAL LOW (ref 3.5–5.0)
Anion gap: 7 (ref 5–15)
BUN: 6 mg/dL — ABNORMAL LOW (ref 8–23)
CO2: 28 mmol/L (ref 22–32)
Calcium: 8.7 mg/dL — ABNORMAL LOW (ref 8.9–10.3)
Chloride: 103 mmol/L (ref 98–111)
Creatinine, Ser: 0.69 mg/dL (ref 0.44–1.00)
GFR calc Af Amer: >60 mL/min (ref >60)
GFR calc non Af Amer: >60 mL/min (ref >60)
Glucose, Bld: 207 mg/dL — ABNORMAL HIGH (ref 70–99)
Phosphorus: 2.3 mg/dL — ABNORMAL LOW (ref 2.5–4.6)
Potassium: 3.9 mmol/L (ref 3.5–5.1)
Sodium: 138 mmol/L (ref 135–145)

## 2018-05-31 LAB — PROCALCITONIN: Procalcitonin: 1.92 ng/mL

## 2018-05-31 LAB — MAGNESIUM: Magnesium: 1.8 mg/dL (ref 1.7–2.4)

## 2018-05-31 LAB — HEMOGLOBIN A1C
Hgb A1c MFr Bld: 10.6 % — ABNORMAL HIGH (ref 4.8–5.6)
Mean Plasma Glucose: 257.52 mg/dL

## 2018-05-31 MED ORDER — AMLODIPINE BESYLATE 5 MG PO TABS
5.0000 mg | ORAL_TABLET | Freq: Every day | ORAL | Status: DC
Start: 2018-05-31 — End: 2018-06-01
  Administered 2018-05-31 – 2018-06-01 (×2): 5 mg via ORAL
  Filled 2018-05-31 (×2): qty 1

## 2018-05-31 NOTE — Progress Notes (Signed)
PROGRESS NOTE    Crystal Moore  MGQ:[id number] DOB: 01-15-1946 DOA: 05/28/2018 PCP: [name], MD  Outpatient Specialists:   Brief Narrative:   Crystal Moore is a 72 y.o. female with medical history significant of hypertension, type 2 diabetes, seizure, previous stroke with residual left-sided deficits presenting to the hospital for evaluation of left-sided flaccidity, hypotension, nausea and vomiting, urinary fecal incontinence, left lower lobe infiltrate and altered mental status.  There was initial concern for possible acute CVA versus seizure, however, neurology team has advised for patient to be monitored for now.  GI team is worried about possible ischemic colitis.  Troponin is less than 0.03.  Lactic acid is elevated to around 2.  Procalcitonin level is greater than 8.  CT scan of the abdomen and pelvis done also revealed left lower lobe infiltrate.  She is at risk for aspiration, considering history of nausea and vomiting.  GI input is to being awaited.  05/30/2018: Patient looks better today.  Chest x-ray is negative for infiltrates.  Procalcitonin is on the downward trend.  Leukocytosis is resolving.  Will proceed with blood cultures (I did not visualize any pending blood culture results).  Continue antibiotics for now.  No further GI bleed reported.  Lactic acidosis has resolved significantly.  05/31/2018: Patient seen.  Pain continues to improve.  Procalcitonin is down to 1.92 today.  We will continue IV antibiotics for today.  Likely, patient will be discharged back home on oral antibiotics.  No fever or chills.  Will advance patient's diet to full diet.   Assessment & Plan:   Principal Problem:   Ischemic colitis ([place]) Active Problems:   Sepsis ([place])   Hematochezia   Acute encephalopathy   Type 2 diabetes mellitus (HCC)   Hypertension   Depression   COPD (chronic obstructive pulmonary disease) (HCC)   Malnutrition of moderate degree   Sepsis possible secondary to  pneumonia:  Patient is at risk of aspiration. Continue IV cefepime. Check nasal swab for MRSA. Low threshold to add IV vancomycin. Chest x-ray is negative for infiltrate.   Blood cultures x2.   Continue antibiotics.   Lactic acidosis has resolved.   Leukocytosis is resolving.   Procalcitonin is on the downward trend. 05/31/2018: Procalcitonin is down to 1.92.  Continue IV antibiotics for now.  Likely discharge back home tomorrow on oral antibiotics.  Hypoglycemia: Low blood sugar reported earlier today (in the 50s) Patient is on subcutaneous Lantus insulin 20 units at nighttime, as well as sliding scale insulin coverage. We will hold Lantus insulin for tonight. Will resume Lantus insulin tomorrow at the dose of 10 units daily, if no further drop in blood sugar is noted. We will check blood sugar every hour for the next 4 hours. Further management will depend on hospital course. This could be part of sepsis syndrome. Hypoglycemia may have been the initial event that triggered his admission as well.  05/31/2018: Hypoglycemia has resolved significantly.  Will advance diet to full diet.  Adjust insulin dose accordingly.  Colitis, suspected ischemic colitis, Lactic acid has resolved significantly.  Patient was hypotensive at some point. Continue to monitor supportively for now. Await GI input is appreciated.  No further GI work-up. No further GI bleed reported.  05/31/2018: Complete course of antibiotics.  Nausea, vomiting and diarrhea: Seems to have resolved significantly. Manage supportively. Keep patient hydrated, but cautiously.  Rule out seizure/Acute CVA: Family is reported to report that patient is at her baseline in terms of neuro deficit.  05/31/2018:  No further deficits noted.  Acute encephalopathy: Resolved.  Type 2 diabetes  -Lantus  -SSI coverage. -Continue to optimize dose of subcutaneous Lantus.  Hypertension -05/31/2018: Will start amlodipine 5 mg p.o.  once daily.  Monitor blood pressure closely.  Will avoid hypotension.    Depression -Hold home Paxil at this time; patient is n.p.o.  COPD -Historical  DVT prophylaxis: SCDs Code Status: Full Family Communication:  Grandson. Disposition Plan: Will depend on hospital course.  Likely discharge back, in the morning. Consults called: Gastroenterology (Dr. Alessandra Bevels), neurology (Dr. Leonel Ramsay)  Procedures:   None  Antimicrobials:   IV Zosyn  Discontinue IV vancomycin today, 05/31/2018.      Subjective: No new changes Asking for food No fever or chills reported  Objective: Vitals:   05/31/18 0752 05/31/18 0806 05/31/18 1218 05/31/18 1438  BP: (!) 157/75  (!) 171/69 (!) 151/61  Pulse: 64  64 78  Resp: 16  13 14   Temp:  98.5 F (36.9 C) 98.4 F (36.9 C)   TempSrc:  Oral Oral   SpO2: 100%  98% 92%  Weight:      Height:        Intake/Output Summary (Last 24 hours) at 05/31/2018 1721 Last data filed at 05/31/2018 1500 Gross per 24 hour  Intake 250 ml  Output 720 ml  Net -470 ml   Filed Weights   05/28/18 1935  Weight: 69 kg    Examination:  General exam: Appears calm and comfortable  Respiratory system: Clear to auscultation. Respiratory effort normal. Cardiovascular system: S1 & S2 heard  Gastrointestinal system: Abdomen is nondistended, soft and nontender. No organomegaly or masses felt. Normal bowel sounds heard. Central nervous system: Left sided Hemiparesis (LUE worse than LLE). Awake and alert.A Extremities: No leg edema.  Data Reviewed: I have personally reviewed following labs and imaging studies  CBC: Recent Labs  Lab 05/28/18 1905  05/29/18 0136 05/29/18 0339 05/29/18 1005 05/30/18 0313 05/31/18 0253  WBC 18.2*  --  18.7* 18.7* 16.5* 13.3* 10.7*  NEUTROABS 11.4*  --   --   --   --  9.1* 7.7  HGB 14.2   < > 14.9 13.9 13.0 11.8* 12.1  HCT 46.3*   < > 48.8* 45.4 41.3 36.7 39.8  MCV 84.0  --  84.6 85.0 84.1 84.2 84.5  PLT 189  --  193  209 208 186 203   < > = values in this interval not displayed.   Basic Metabolic Panel: Recent Labs  Lab 05/28/18 1909 05/30/18 0313 05/31/18 0253  NA 140 139 138  K 3.8 2.9* 3.9  CL 104 109 103  CO2  --  26 28  GLUCOSE 278* 93 207*  BUN 12 16 6*  CREATININE 0.70 0.91 0.69  CALCIUM  --  8.3* 8.7*  MG  --  1.7 1.8  PHOS  --  3.3 2.3*   GFR: Estimated Creatinine Clearance: 58.8 mL/min (by C-G formula based on SCr of 0.69 mg/dL). Liver Function Tests: Recent Labs  Lab 05/29/18 0136 05/30/18 0313 05/31/18 0253  AST 20  --   --   ALT 16  --   --   ALKPHOS 101  --   --   BILITOT 0.7  --   --   PROT 6.8  --   --   ALBUMIN 3.4* 2.7* 2.8*   No results for input(s): LIPASE, AMYLASE in the last 168 hours. No results for input(s): AMMONIA in the last 168 hours. Coagulation Profile: Recent  Labs  Lab 05/28/18 1905  INR 1.22   Cardiac Enzymes: Recent Labs  Lab 05/29/18 1053 05/29/18 1632  TROPONINI <0.03 <0.03   BNP (last 3 results) No results for input(s): PROBNP in the last 8760 hours. HbA1C: No results for input(s): HGBA1C in the last 72 hours. CBG: Recent Labs  Lab 05/30/18 1836 05/30/18 2124 05/31/18 0531 05/31/18 0806 05/31/18 1254  GLUCAP 105* 314* 170* 174* 178*   Lipid Profile: No results for input(s): CHOL, HDL, LDLCALC, TRIG, CHOLHDL, LDLDIRECT in the last 72 hours. Thyroid Function Tests: No results for input(s): TSH, T4TOTAL, FREET4, T3FREE, THYROIDAB in the last 72 hours. Anemia Panel: No results for input(s): VITAMINB12, FOLATE, FERRITIN, TIBC, IRON, RETICCTPCT in the last 72 hours. Urine analysis: No results found for: COLORURINE, APPEARANCEUR, LABSPEC, PHURINE, GLUCOSEU, HGBUR, BILIRUBINUR, KETONESUR, PROTEINUR, UROBILINOGEN, NITRITE, LEUKOCYTESUR Sepsis Labs: @LABRCNTIP (procalcitonin:4,lacticidven:4)  ) Recent Results (from the past 240 hour(s))  MRSA PCR Screening     Status: None   Collection Time: 05/29/18  3:20 PM  Result Value Ref  Range Status   MRSA by PCR NEGATIVE NEGATIVE Final    Comment:        The GeneXpert MRSA Assay (FDA approved for NASAL specimens only), is one component of a comprehensive MRSA colonization surveillance program. It is not intended to diagnose MRSA infection nor to guide or monitor treatment for MRSA infections. Performed at [facility], [place] [address], [place], [place] [address]   Respiratory Panel by PCR     Status: None   Collection Time: 05/29/18  3:20 PM  Result Value Ref Range Status   Adenovirus NOT DETECTED NOT DETECTED Final   Coronavirus 229E NOT DETECTED NOT DETECTED Final   Coronavirus HKU1 NOT DETECTED NOT DETECTED Final   Coronavirus NL63 NOT DETECTED NOT DETECTED Final   Coronavirus OC43 NOT DETECTED NOT DETECTED Final   Metapneumovirus NOT DETECTED NOT DETECTED Final   Rhinovirus / Enterovirus NOT DETECTED NOT DETECTED Final   Influenza A NOT DETECTED NOT DETECTED Final   Influenza B NOT DETECTED NOT DETECTED Final   Parainfluenza Virus 1 NOT DETECTED NOT DETECTED Final   Parainfluenza Virus 2 NOT DETECTED NOT DETECTED Final   Parainfluenza Virus 3 NOT DETECTED NOT DETECTED Final   Parainfluenza Virus 4 NOT DETECTED NOT DETECTED Final   Respiratory Syncytial Virus NOT DETECTED NOT DETECTED Final   Bordetella pertussis NOT DETECTED NOT DETECTED Final   Chlamydophila pneumoniae NOT DETECTED NOT DETECTED Final   Mycoplasma pneumoniae NOT DETECTED NOT DETECTED Final    Comment: Performed at [facility], [place] [address] [place], [place] [address]  Culture, blood (routine x 2)     Status: None (Preliminary result)   Collection Time: 05/30/18  4:07 PM  Result Value Ref Range Status   Specimen Description BLOOD LEFT ANTECUBITAL  Final   Special Requests AEROBIC BOTTLE ONLY Blood Culture adequate volume  Final   Culture   Final    NO GROWTH < 24 HOURS Performed at [name], [place] [address] [place], [place] [address]    Report Status PENDING   Incomplete  Culture, blood (routine x 2)     Status: None (Preliminary result)   Collection Time: 05/30/18  4:30 PM  Result Value Ref Range Status   Specimen Description BLOOD HAND  Final   Special Requests   Final    BOTTLES DRAWN AEROBIC ONLY Blood Culture adequate volume   Culture   Final    NO  GROWTH < 24 HOURS Performed at [facility], [place] [address] [place], [place] [address]    Report Status PENDING  Incomplete         Radiology Studies: Dg Chest Port 1 View  Result Date: 05/30/2018 CLINICAL DATA:  Pneumonia. EXAM: PORTABLE CHEST 1 VIEW COMPARISON:  None. FINDINGS: Lungs are adequately inflated with mild prominence of the perihilar markings suggesting mild vascular congestion. No lobar consolidation or effusion. 5 mm nodular density over the right midlung. Cardiomediastinal silhouette is within normal. Mild calcified plaque over the aortic arch. Remainder of the exam is unremarkable. IMPRESSION: Findings suggesting minimal vascular congestion. 5 mm nodular density over the right midlung. Recommend noncontrast chest CT on an elective basis for further evaluation. Electronically Signed   By: Marin Olp M.D.   On: 05/30/2018 07:54        Scheduled Meds: . feeding supplement (ENSURE ENLIVE)  237 mL Oral BID BM  . insulin aspart  0-9 Units Subcutaneous TID WC  . insulin glargine  10 Units Subcutaneous Daily   Continuous Infusions: . piperacillin-tazobactam (ZOSYN)  IV 3.375 g (05/31/18 1218)  . vancomycin 500 mg (05/31/18 0522)     LOS: 2 days    Time spent: 25 minutes   Dana Allan, MD  [facility] Pager #: [phone] 7PM-7AM contact night coverage as above

## 2018-05-31 NOTE — Care Management Note (Signed)
Case Management Note  Patient Details  Name: Crystal Moore MRN: [id number] Date of Birth: 26-Jun-1946  Subjective/Objective:   Admitted with AMS,  Ischemic colitis. Hx of ,type 2 diabetes, seizure, previous stroke with residual left-sided deficits. Resides with daughter, [name]. Uses cane with ambulation. Independent with  ADL's PTA.          [name] (Daughter) Malorie [phone]     PCP; [name]  Action/Plan: Transition to home , 06/02/2018 with self care. No needs identified per NCM. States has transportation to home.   Expected Discharge Date:       06/02/2018           Expected Discharge Plan:  Home/Self Care  In-House Referral:     Discharge planning Services  CM Consult  Post Acute Care Choice:    Choice offered to:     DME Arranged:   N/A DME Agency:   N/A  HH Arranged:   N/A HH Agency:   N/A  Status of Service:  completed  If discussed at Long Length of Stay Meetings, dates discussed:    Additional Comments:  Sharin Mons, RN 05/31/2018, 8:56 AM

## 2018-05-31 NOTE — Progress Notes (Signed)
MD made aware about patient's high BP. Patient asymptomatic, no complaints at this time. Will continue to monitor.

## 2018-05-31 NOTE — Progress Notes (Signed)
Inpatient Diabetes Program Recommendations  AACE/ADA: New Consensus Statement on Inpatient Glycemic Control (2015)  Target Ranges:  Prepandial:   less than 140 mg/dL      Peak postprandial:   less than 180 mg/dL (1-2 hours)      Critically ill patients:  140 - 180 mg/dL   Lab Results  Component Value Date   GLUCAP 174 (H) 05/31/2018    Review of Glycemic Control Results for SHEALA, DOSH (MRN [id number]) as of 05/31/2018 11:50  Ref. Range 05/30/2018 18:17 05/30/2018 18:36 05/30/2018 21:24 05/31/2018 05:31  Glucose-Capillary Latest Ref Range: 70 - 99 mg/dL 67 (L) 105 (H) 314 (H) 170 (H)   Diabetes history: Type 2 DM Outpatient Diabetes medications: Glipizide 5 mg BID, Lantus 50 units QAM, Metformin 500mg  BID Current orders for Inpatient glycemic control: Lantus 10 units QD, Novolog 0-9 units TID  Inpatient Diabetes Program Recommendations:    Noted hypoglycemic episode of 67 mg/dL and adjustments made to insulin regimen. Lantus held on 11/4. Will continue to follow given changes.   Thanks, Bronson Curb, MSN, RNC-OB Diabetes Coordinator [phone] (8a-5p)

## 2018-06-01 LAB — CBC WITH DIFFERENTIAL/PLATELET
Abs Immature Granulocytes: 0.02 10*3/uL (ref 0.00–0.07)
Basophils Absolute: 0.1 10*3/uL (ref 0.0–0.1)
Basophils Relative: 1 %
Eosinophils Absolute: 0.1 10*3/uL (ref 0.0–0.5)
Eosinophils Relative: 2 %
HCT: 43.7 % (ref 36.0–46.0)
Hemoglobin: 13.9 g/dL (ref 12.0–15.0)
Immature Granulocytes: 0 %
Lymphocytes Relative: 30 %
Lymphs Abs: 2.3 10*3/uL (ref 0.7–4.0)
MCH: 26.5 pg (ref 26.0–34.0)
MCHC: 31.8 g/dL (ref 30.0–36.0)
MCV: 83.2 fL (ref 80.0–100.0)
Monocytes Absolute: 0.5 10*3/uL (ref 0.1–1.0)
Monocytes Relative: 6 %
Neutro Abs: 4.7 10*3/uL (ref 1.7–7.7)
Neutrophils Relative %: 61 %
Platelets: 196 10*3/uL (ref 150–400)
RBC: 5.25 MIL/uL — ABNORMAL HIGH (ref 3.87–5.11)
RDW: 14.2 % (ref 11.5–15.5)
WBC: 7.8 10*3/uL (ref 4.0–10.5)
nRBC: 0 % (ref 0.0–0.2)

## 2018-06-01 LAB — GLUCOSE, CAPILLARY: GLUCOSE-CAPILLARY: 201 mg/dL — AB (ref 70–99)

## 2018-06-01 LAB — VANCOMYCIN, TROUGH: Vancomycin Tr: 4 ug/mL — ABNORMAL LOW (ref 15–20)

## 2018-06-01 MED ORDER — METRONIDAZOLE 500 MG PO TABS: 500.0000 mg | ORAL_TABLET | Freq: Three times a day (TID) | ORAL | 0 refills | Status: AC

## 2018-06-01 MED ORDER — INSULIN GLARGINE 100 UNIT/ML ~~LOC~~ SOLN: 15.0000 [IU] | Freq: Every day | SUBCUTANEOUS | 11 refills | Status: DC

## 2018-06-01 MED ORDER — CIPROFLOXACIN HCL 500 MG PO TABS: 500.0000 mg | ORAL_TABLET | Freq: Two times a day (BID) | ORAL | 0 refills | Status: AC

## 2018-06-01 MED ORDER — ENSURE ENLIVE PO LIQD: 237.0000 mL | Freq: Two times a day (BID) | ORAL | 12 refills | Status: DC

## 2018-06-01 MED ORDER — METFORMIN HCL 500 MG PO TABS: 1000.0000 mg | ORAL_TABLET | Freq: Two times a day (BID) | ORAL | 0 refills | Status: DC

## 2018-06-01 MED ORDER — AMLODIPINE BESYLATE 5 MG PO TABS: 5.0000 mg | ORAL_TABLET | Freq: Every day | ORAL | 0 refills | Status: DC

## 2018-06-01 NOTE — Progress Notes (Signed)
No significant changes overnight.  Pt had an episode of urinary incontinence while sleeping.  Linen changed and bathe given.  Resting quietly in bed.  Will continue to monitor.

## 2018-06-01 NOTE — Progress Notes (Signed)
Crystal Moore to be D/C'd Home per MD order.  Discussed prescriptions and follow up appointments with the patient. Prescriptions given to patient, medication list explained in detail. Pt verbalized understanding.  Allergies as of 06/01/2018   No Known Allergies     Medication List    STOP taking these medications   amLODipine-benazepril 5-20 MG capsule Commonly known as:  LOTREL   aspirin EC 81 MG tablet   glipiZIDE 5 MG 24 hr tablet Commonly known as:  GLUCOTROL XL   LANTUS SOLOSTAR 100 UNIT/ML Solostar Pen Generic drug:  Insulin Glargine Replaced by:  insulin glargine 100 UNIT/ML injection   naproxen sodium 220 MG tablet Commonly known as:  ALEVE   PAXIL 10 MG tablet Generic drug:  PARoxetine     TAKE these medications   amLODipine 5 MG tablet Commonly known as:  NORVASC Take 1 tablet (5 mg total) by mouth daily.   ciprofloxacin 500 MG tablet Commonly known as:  CIPRO Take 1 tablet (500 mg total) by mouth 2 (two) times daily for 7 days.   feeding supplement (ENSURE ENLIVE) Liqd Take 237 mLs by mouth 2 (two) times daily between meals.   insulin glargine 100 UNIT/ML injection Commonly known as:  LANTUS Inject 0.15 mLs (15 Units total) into the skin daily. Replaces:  LANTUS SOLOSTAR 100 UNIT/ML Solostar Pen   metFORMIN 500 MG tablet Commonly known as:  GLUCOPHAGE Take 2 tablets (1,000 mg total) by mouth 2 (two) times daily. What changed:  how much to take   metroNIDAZOLE 500 MG tablet Commonly known as:  FLAGYL Take 1 tablet (500 mg total) by mouth 3 (three) times daily for 7 days.   TRELEGY ELLIPTA 100-62.5-25 MCG/INH Aepb Generic drug:  Fluticasone-Umeclidin-Vilant Inhale 1 puff into the lungs daily.       Vitals:   06/01/18 0353 06/01/18 0800  BP: (!) 158/75 (!) 142/84  Pulse:  75  Resp: 12   Temp: 98 F (36.7 C) 98.3 F (36.8 C)  SpO2: 96% 100%    Skin clean, dry and intact without evidence of skin break down, no evidence of skin tears noted.  IV catheter discontinued intact. Site without signs and symptoms of complications. Dressing and pressure applied. Pt denies pain at this time. No complaints noted.  An After Visit Summary was printed and given to the patient. Patient escorted via [place], and D/C home via private auto.  Chapman Fitch BSN, RN [employer] [address] Phone [phone]

## 2018-06-01 NOTE — Care Management Important Message (Signed)
Important Message  Patient Details  Name: Crystal Moore MRN: [id number] Date of Birth: 07/17/1946   Medicare Important Message Given:  Yes    Keni Elison [address]ine Circle 06/01/2018, 2:46 PM

## 2018-06-01 NOTE — Progress Notes (Signed)
   06/01/18 1100  Clinical Encounter Type  Visited With Patient  Visit Type Initial  Referral From Nurse  Consult/Referral To Chaplain  Spiritual Encounters  Spiritual Needs Other (Comment)  Stress Factors  Patient Stress Factors Other (Comment)   While rounding floor PT was requested a visit. PT was alert. PT requested personal care from Nurse. I offered spiritual with ministry of presence.   Chaplain Fidel Levy [phone]

## 2018-06-01 NOTE — Discharge Summary (Signed)
Physician Discharge Summary  Patient ID: Crystal Moore MRN: [id number] DOB/AGE: 12/08/1945 72 y.o.  Admit date: 05/28/2018 Discharge date: 06/01/2018  Admission Diagnoses:  Discharge Diagnoses:  Principal Problem:   Ischemic colitis ([place]) Active Problems:   Sepsis ([place])   SIRS   Hypoglycemia   Possible undiagnosed cognitive deficit   Hematochezia   Acute encephalopathy   Type 2 diabetes mellitus (HCC)   Hypertension   Depression   COPD (chronic obstructive pulmonary disease) (HCC)   Malnutrition of moderate degree   Discharged Condition: stable  Hospital Course:  Annamaria Salah a 72 year old female, with past medical history significant for hypertension,type 2 diabetes, seizure, previous stroke with residual left-sided deficits.  Patient presented with hypotension, nausea and vomiting, urinary and fecal incontinence, left lower lobe infiltrate and altered mental status.  There was initial concern for seizure versus worsening left-sided weakness and possible acute CVA, but patient's daughter confirmed that there was no worsening left-sided weakness.  Patient was seen by neurology team, Dr. Leonel Ramsay, and no further neurological work-up was advised.  CT scan of the abdomen and pelvis with contrast done on presentation revealed possible transverse colon and descending colon colitis, worrisome for ischemic colitis.  Significant leukocytosis was also noted.  Procalcitonin was greater than 8.  There was reported history of blood in the stool.  GI team was consulted.  GI team did not advise any further GI work-up, but advised continuing IV antibiotics.  Patient was initially treated with IV Zosyn.  Due to the fact that the CT of the abdomen and pelvis also revealed left lower lobe infiltrate, IV vancomycin was added, but this was later discontinued.  Procalcitonin has improved significantly.  Leukocytosis has resolved.  GI bleeding has also resolved.  Patient has improved significantly.   Patient will be discharged on ciprofloxacin and Flagyl orally.  During the hospital stay, episode of hypoglycemia was noted.  Patient's insulin regimen has been adjusted accordingly.  Metformin will be increased to 1000 mg p.o. twice daily on discharge.  Glucotrol will be held.  The primary care provider will kindly monitor patient's blood sugar, and continue to adjust regimen accordingly.  Patient's aspirin is currently on hold.   GI team and the PCP will advise patient on when to restart aspirin.  Patient may also have previously undiagnosed cognitive deficit.  Patient will be discharged to the care of the primary care provider and the GI team.  Sepsis possible/SIRS, source uncertain:  Patient is at risk of aspiration, but repeat chest x-ray was negative for infiltrates. Nasal swab for MRSA came back negative. Cultures have not grown any organisms. Patient was managed with IV antibiotics (kindly see above).   Procalcitonin has improved significantly (trended downward)  Leukocytosis has resolved.   Vital signs are stable.   Patient will be discharged on oral Cipro and Flagyl.    Hypoglycemia: Episode of low blood sugar reported to be in the 50s during the hospital stay. Patient will be discharged on subcutaneous Lantus insulin 15 units daily. Patient will also be discharged on metformin 1000 mg p.o. twice daily. Glucotrol will be held on discharge. PCP will kindly continue to monitor blood sugar and adjust diabetes medications accordingly.  Colitis, possible ischemiccolitis, Lactic acid has resolved significantly.  Patient was hypotensive at some point, but this has resolved. Continue to monitor supportively for now. Avoid low blood pressures. HCTZ has been discontinued, and patient will be discharged on only amlodipine. No further GI bleeding reported Patient will follow with GI on discharge  for outpatient GI work-up if indicated. Guarded prognosis.  Nausea vomiting: Seems to have  resolved significantly. Managed supportively.  Rule out seizure/Acute CVA: Family report that patient is at her baseline in terms of neurological deficit.  Acute encephalopathy: Resolved. Patient may have undiagnosed cognitive disorder (dementing illness)  Type 2 diabetes Discharge patient on subcu Lantus 15 units once daily.   Increase metformin to 1000 mg p.o. twice daily on discharge.   PCP to manage patient's diabetes mellitus.    Hypertension HCTZ has been discontinued. Hypokalemia was also noted during the hospital stay. Continue amlodipine.  Depression -Hold home Paxil at this time;patient is n.p.o.  COPD -Historical  Consults: GI, Neurology  Significant Diagnostic Studies:   Discharge Exam: Blood pressure (!) 142/84, pulse 75, temperature 98.3 F (36.8 C), temperature source Oral, resp. rate 12, height 5\' 2"  (1.575 m), weight 69 kg, SpO2 100 %.   Disposition: Discharge disposition: 01-Home or Self Care   Discharge Instructions    Diet - low sodium heart healthy   Complete by:  As directed    Increase activity slowly   Complete by:  As directed      Allergies as of 06/01/2018   No Known Allergies     Medication List    STOP taking these medications   amLODipine-benazepril 5-20 MG capsule Commonly known as:  LOTREL   aspirin EC 81 MG tablet   glipiZIDE 5 MG 24 hr tablet Commonly known as:  GLUCOTROL XL   LANTUS SOLOSTAR 100 UNIT/ML Solostar Pen Generic drug:  Insulin Glargine Replaced by:  insulin glargine 100 UNIT/ML injection   naproxen sodium 220 MG tablet Commonly known as:  ALEVE   PAXIL 10 MG tablet Generic drug:  PARoxetine     TAKE these medications   amLODipine 5 MG tablet Commonly known as:  NORVASC Take 1 tablet (5 mg total) by mouth daily.   ciprofloxacin 500 MG tablet Commonly known as:  CIPRO Take 1 tablet (500 mg total) by mouth 2 (two) times daily for 7 days.   feeding supplement (ENSURE ENLIVE) Liqd Take  237 mLs by mouth 2 (two) times daily between meals.   insulin glargine 100 UNIT/ML injection Commonly known as:  LANTUS Inject 0.15 mLs (15 Units total) into the skin daily. Replaces:  LANTUS SOLOSTAR 100 UNIT/ML Solostar Pen   metFORMIN 500 MG tablet Commonly known as:  GLUCOPHAGE Take 2 tablets (1,000 mg total) by mouth 2 (two) times daily. What changed:  how much to take   metroNIDAZOLE 500 MG tablet Commonly known as:  FLAGYL Take 1 tablet (500 mg total) by mouth 3 (three) times daily for 7 days.   TRELEGY ELLIPTA 100-62.5-25 MCG/INH Aepb Generic drug:  Fluticasone-Umeclidin-Vilant Inhale 1 puff into the lungs daily.      32 minutes spent discharging patient.  SignedBonnell Public 06/01/2018, 9:48 AM

## 2018-06-02 ENCOUNTER — Encounter: Payer: Self-pay | Admitting: Endocrinology

## 2018-06-04 LAB — CULTURE, BLOOD (ROUTINE X 2)
Culture: NO GROWTH
Culture: NO GROWTH
Special Requests: ADEQUATE
Special Requests: ADEQUATE

## 2018-06-10 ENCOUNTER — Encounter: Payer: Self-pay | Admitting: Endocrinology

## 2018-06-10 ENCOUNTER — Ambulatory Visit (INDEPENDENT_AMBULATORY_CARE_PROVIDER_SITE_OTHER): Payer: Medicare Other | Admitting: Endocrinology

## 2018-06-10 VITALS — BP 132/68 | HR 84 | Resp 16 | Ht 62.0 in | Wt 135.0 lb

## 2018-06-10 DIAGNOSIS — E440 Moderate protein-calorie malnutrition: Secondary | ICD-10-CM

## 2018-06-10 MED ORDER — INSULIN DEGLUDEC 100 UNIT/ML ~~LOC~~ SOPN: 15.0000 [IU] | PEN_INJECTOR | Freq: Every day | SUBCUTANEOUS | 11 refills | Status: DC

## 2018-06-10 NOTE — Patient Instructions (Addendum)
check your blood sugar twice a day.  vary the time of day when you check, between before the 3 meals, and at bedtime.  also check if you have symptoms of your blood sugar being too high or too low.  please keep a record of the readings and bring it to your next appointment here (or you can bring the meter itself).  You can write it on any piece of paper.  please call us sooner if your blood sugar goes below 70, or if you have a lot of readings over 200. We will need to take this complex situation in stages For now, please: Change the insulin back to "tresiba," 15 units daily.  If you miss the shot, take as soon as you remember.   Please call or message [place] next week, to tell us how the blood sugar is doing.   Please come back for a follow-up appointment in 1 month.

## 2018-06-10 NOTE — Progress Notes (Signed)
Subjective:    Patient ID: Crystal Moore, female    DOB: 04/12/1946, 72 y.o.   MRN: [id number]  HPI Pt returns for f/u of diabetes mellitus: DM type: Insulin-requiring type 2 Dx'ed: 3154 Complications: CVA Therapy: insulin since soon after dx GDM: never DKA: never Severe hypoglycemia: never Pancreatitis: never Pancreatic imaging: normal on 2019 CT Other: dtr gives pt her insulin, but this is sometimes misses, due to schedule; due to memory loss, she is not a candidate for multiple daily injections  Interval history: lantus was decreased to 15/d, in the hospital.  History is from grandson ([name]), due to pt's memory loss.  He does not know if pt takes glipizide.  Pt's dtr checks cbg, but she is not here today, and cbg is not available today.   Past Medical History:  Diagnosis Date  . Arthritis   . COPD (chronic obstructive pulmonary disease) ([place])   . CVA (cerebral vascular accident) ([place])   . Diabetes ([place])   . Diabetes mellitus without complication ([place])   . Hemiparesis affecting left side as late effect of cerebrovascular accident (CVA) ([place])   . Pyelonephritis   . Seizures ([place])   . Stroke ([place])   . Tobacco use   . Tobacco use disorder   . UTI (urinary tract infection)     Past Surgical History:  Procedure Laterality Date  . TUBAL LIGATION      Social History   Socioeconomic History  . Marital status: Single    Spouse name: Not on file  . Number of children: Not on file  . Years of education: Not on file  . Highest education level: Not on file  Occupational History  . Not on file  Social Needs  . Financial resource strain: Not on file  . Food insecurity:    Worry: Not on file    Inability: Not on file  . Transportation needs:    Medical: Not on file    Non-medical: Not on file  Tobacco Use  . Smoking status: Current Every Day Smoker    Packs/day: 1.00    Years: 40.00    Pack years: 40.00    Types: Cigarettes  . Smokeless tobacco: Never Used   Substance and Sexual Activity  . Alcohol use: Never    Frequency: Never  . Drug use: Never  . Sexual activity: Not on file  Lifestyle  . Physical activity:    Days per week: Not on file    Minutes per session: Not on file  . Stress: Not on file  Relationships  . Social connections:    Talks on phone: Not on file    Gets together: Not on file    Attends religious service: Not on file    Active member of club or organization: Not on file    Attends meetings of clubs or organizations: Not on file    Relationship status: Not on file  . Intimate partner violence:    Fear of current or ex partner: Not on file    Emotionally abused: Not on file    Physically abused: Not on file    Forced sexual activity: Not on file  Other Topics Concern  . Not on file  Social History Narrative   ** Merged History Encounter **        Current Outpatient Medications on File Prior to Visit  Medication Sig Dispense Refill  . albuterol (PROVENTIL HFA;VENTOLIN HFA) 108 (90 Base) MCG/ACT inhaler Inhale 2 puffs into  the lungs every 6 (six) hours as needed for wheezing or shortness of breath. 1 Inhaler 6  . feeding supplement, ENSURE ENLIVE, (ENSURE ENLIVE) LIQD Take 237 mLs by mouth 2 (two) times daily between meals. 237 mL 12  . Fluticasone-Umeclidin-Vilant (TRELEGY ELLIPTA) 100-62.5-25 MCG/INH AEPB Inhale 1 puff into the lungs daily. 1 each 6  . metFORMIN (GLUCOPHAGE) 500 MG tablet Take by mouth 2 (two) times daily with a meal.    . nicotine (NICOTROL) 10 MG inhaler Inhale 1 Cartridge (1 continuous puffing total) into the lungs as needed for smoking cessation. 30 each 0  . aspirin EC 81 MG tablet Take 81 mg by mouth daily.    Marland Kitchen PARoxetine (PAXIL) 10 MG tablet Take 10 mg by mouth daily.     No current facility-administered medications on file prior to visit.     No Known Allergies  Family History  Problem Relation Age of Onset  . Diabetes Mother   . Diabetes Sister   . Diabetes Brother     BP  132/68   Pulse 84   Resp 16   Ht 5\' 2"  (1.575 m)   Wt 135 lb (61.2 kg)   BMI 24.69 kg/m   Review of Systems No weight change.      Objective:   Physical Exam VITAL SIGNS:  See vs page GENERAL: no distress Pulses: dorsalis pedis intact bilat.   MSK: no deformity of the feet CV: no leg edema Skin:  no ulcer on the feet.  normal color and temp on the feet. Neuro: sensation is intact to touch on the feet, but decreased from normal Ext: There is bilateral onychomycosis of the toenails.       Assessment & Plan:  Insulin-requiring type 2 DM, with CVA. Missing insulin and cbg's.  She needs dosing flexibility of tresiba. Memory loss: she is not a candidate for multiple daily injections  Patient Instructions  check your blood sugar twice a day.  vary the time of day when you check, between before the 3 meals, and at bedtime.  also check if you have symptoms of your blood sugar being too high or too low.  please keep a record of the readings and bring it to your next appointment here (or you can bring the meter itself).  You can write it on any piece of paper.  please call us sooner if your blood sugar goes below 70, or if you have a lot of readings over 200. We will need to take this complex situation in stages For now, please: Change the insulin back to "tresiba," 15 units daily.  If you miss the shot, take as soon as you remember.   Please call or message [place] next week, to tell us how the blood sugar is doing.   Please come back for a follow-up appointment in 1 month.

## 2018-07-12 ENCOUNTER — Ambulatory Visit: Payer: Medicare Other | Admitting: Dietician

## 2018-07-12 ENCOUNTER — Ambulatory Visit: Payer: Medicare Other | Admitting: Endocrinology

## 2018-09-27 ENCOUNTER — Telehealth: Payer: Self-pay | Admitting: Endocrinology

## 2018-09-27 ENCOUNTER — Ambulatory Visit: Payer: Medicare Other | Admitting: Endocrinology

## 2018-09-27 NOTE — Telephone Encounter (Signed)
Patient no showed today's appt. Please advise on how to follow up. °A. No follow up necessary. °B. Follow up urgent. Contact patient immediately. °C. Follow up necessary. Contact patient and schedule visit in ___ days. °D. Follow up advised. Contact patient and schedule visit in ____weeks. ° °Would you like the NS fee to be applied to this visit? ° °

## 2018-12-17 ENCOUNTER — Encounter (HOSPITAL_COMMUNITY): Payer: Self-pay | Admitting: Emergency Medicine

## 2018-12-17 ENCOUNTER — Emergency Department (HOSPITAL_COMMUNITY): Payer: Medicare Other

## 2018-12-17 ENCOUNTER — Other Ambulatory Visit: Payer: Self-pay

## 2018-12-17 ENCOUNTER — Inpatient Hospital Stay (HOSPITAL_COMMUNITY)
Admission: EM | Admit: 2018-12-17 | Discharge: 2018-12-21 | DRG: 065 | Disposition: A | Discharge status: Home Health | Payer: Medicare Other | Attending: Internal Medicine | Admitting: Internal Medicine

## 2018-12-17 DIAGNOSIS — Z716 Tobacco abuse counseling: Secondary | ICD-10-CM | POA: Diagnosis not present

## 2018-12-17 DIAGNOSIS — Z7951 Long term (current) use of inhaled steroids: Secondary | ICD-10-CM

## 2018-12-17 DIAGNOSIS — I63521 Cerebral infarction due to unspecified occlusion or stenosis of right anterior cerebral artery: Secondary | ICD-10-CM | POA: Diagnosis present

## 2018-12-17 DIAGNOSIS — E1169 Type 2 diabetes mellitus with other specified complication: Secondary | ICD-10-CM | POA: Diagnosis not present

## 2018-12-17 DIAGNOSIS — J449 Chronic obstructive pulmonary disease, unspecified: Secondary | ICD-10-CM | POA: Diagnosis present

## 2018-12-17 DIAGNOSIS — I672 Cerebral atherosclerosis: Secondary | ICD-10-CM | POA: Diagnosis present

## 2018-12-17 DIAGNOSIS — Z794 Long term (current) use of insulin: Secondary | ICD-10-CM

## 2018-12-17 DIAGNOSIS — I6521 Occlusion and stenosis of right carotid artery: Secondary | ICD-10-CM | POA: Diagnosis not present

## 2018-12-17 DIAGNOSIS — E1165 Type 2 diabetes mellitus with hyperglycemia: Secondary | ICD-10-CM | POA: Diagnosis present

## 2018-12-17 DIAGNOSIS — W06XXXA Fall from bed, initial encounter: Secondary | ICD-10-CM | POA: Diagnosis present

## 2018-12-17 DIAGNOSIS — Z20828 Contact with and (suspected) exposure to other viral communicable diseases: Secondary | ICD-10-CM | POA: Diagnosis present

## 2018-12-17 DIAGNOSIS — Y92009 Unspecified place in unspecified non-institutional (private) residence as the place of occurrence of the external cause: Secondary | ICD-10-CM

## 2018-12-17 DIAGNOSIS — R471 Dysarthria and anarthria: Secondary | ICD-10-CM | POA: Diagnosis present

## 2018-12-17 DIAGNOSIS — R5381 Other malaise: Secondary | ICD-10-CM | POA: Diagnosis present

## 2018-12-17 DIAGNOSIS — Z833 Family history of diabetes mellitus: Secondary | ICD-10-CM

## 2018-12-17 DIAGNOSIS — I6523 Occlusion and stenosis of bilateral carotid arteries: Secondary | ICD-10-CM | POA: Diagnosis present

## 2018-12-17 DIAGNOSIS — Z79899 Other long term (current) drug therapy: Secondary | ICD-10-CM | POA: Diagnosis not present

## 2018-12-17 DIAGNOSIS — R29713 NIHSS score 13: Secondary | ICD-10-CM | POA: Diagnosis present

## 2018-12-17 DIAGNOSIS — Z720 Tobacco use: Secondary | ICD-10-CM | POA: Diagnosis not present

## 2018-12-17 DIAGNOSIS — E785 Hyperlipidemia, unspecified: Secondary | ICD-10-CM | POA: Diagnosis present

## 2018-12-17 DIAGNOSIS — I10 Essential (primary) hypertension: Secondary | ICD-10-CM | POA: Diagnosis present

## 2018-12-17 DIAGNOSIS — J9611 Chronic respiratory failure with hypoxia: Secondary | ICD-10-CM | POA: Diagnosis present

## 2018-12-17 DIAGNOSIS — I69354 Hemiplegia and hemiparesis following cerebral infarction affecting left non-dominant side: Secondary | ICD-10-CM

## 2018-12-17 DIAGNOSIS — I639 Cerebral infarction, unspecified: Secondary | ICD-10-CM | POA: Diagnosis not present

## 2018-12-17 DIAGNOSIS — E876 Hypokalemia: Secondary | ICD-10-CM | POA: Diagnosis present

## 2018-12-17 DIAGNOSIS — F17210 Nicotine dependence, cigarettes, uncomplicated: Secondary | ICD-10-CM | POA: Diagnosis present

## 2018-12-17 DIAGNOSIS — I358 Other nonrheumatic aortic valve disorders: Secondary | ICD-10-CM | POA: Diagnosis not present

## 2018-12-17 DIAGNOSIS — M1990 Unspecified osteoarthritis, unspecified site: Secondary | ICD-10-CM | POA: Diagnosis present

## 2018-12-17 DIAGNOSIS — E119 Type 2 diabetes mellitus without complications: Secondary | ICD-10-CM

## 2018-12-17 LAB — CBC WITH DIFFERENTIAL/PLATELET
Abs Immature Granulocytes: 0.04 10*3/uL (ref 0.00–0.07)
Basophils Absolute: 0 10*3/uL (ref 0.0–0.1)
Basophils Relative: 0 %
Eosinophils Absolute: 0.2 10*3/uL (ref 0.0–0.5)
Eosinophils Relative: 2 %
HCT: 39.9 % (ref 36.0–46.0)
Hemoglobin: 13.5 g/dL (ref 12.0–15.0)
Immature Granulocytes: 0 %
Lymphocytes Relative: 24 %
Lymphs Abs: 2.3 10*3/uL (ref 0.7–4.0)
MCH: 27.3 pg (ref 26.0–34.0)
MCHC: 33.8 g/dL (ref 30.0–36.0)
MCV: 80.6 fL (ref 80.0–100.0)
Monocytes Absolute: 0.6 10*3/uL (ref 0.1–1.0)
Monocytes Relative: 6 %
Neutro Abs: 6.7 10*3/uL (ref 1.7–7.7)
Neutrophils Relative %: 68 %
Platelets: 208 10*3/uL (ref 150–400)
RBC: 4.95 MIL/uL (ref 3.87–5.11)
RDW: 14.4 % (ref 11.5–15.5)
WBC: 9.8 10*3/uL (ref 4.0–10.5)
nRBC: 0 % (ref 0.0–0.2)

## 2018-12-17 LAB — URINALYSIS, ROUTINE W REFLEX MICROSCOPIC
Bilirubin Urine: NEGATIVE
Glucose, UA: 50 mg/dL — AB
Hgb urine dipstick: NEGATIVE
Ketones, ur: NEGATIVE mg/dL
Leukocytes,Ua: NEGATIVE
Nitrite: NEGATIVE
Protein, ur: NEGATIVE mg/dL
Specific Gravity, Urine: 1.014 (ref 1.005–1.030)
pH: 8 (ref 5.0–8.0)

## 2018-12-17 LAB — COMPREHENSIVE METABOLIC PANEL
ALT: 16 U/L (ref 0–44)
AST: 19 U/L (ref 15–41)
Albumin: 3.3 g/dL — ABNORMAL LOW (ref 3.5–5.0)
Alkaline Phosphatase: 86 U/L (ref 38–126)
Anion gap: 13 (ref 5–15)
BUN: 9 mg/dL (ref 8–23)
CO2: 24 mmol/L (ref 22–32)
Calcium: 8.9 mg/dL (ref 8.9–10.3)
Chloride: 97 mmol/L — ABNORMAL LOW (ref 98–111)
Creatinine, Ser: 0.64 mg/dL (ref 0.44–1.00)
GFR calc Af Amer: >60 mL/min (ref >60)
GFR calc non Af Amer: >60 mL/min (ref >60)
Glucose, Bld: 173 mg/dL — ABNORMAL HIGH (ref 70–99)
Potassium: 3.4 mmol/L — ABNORMAL LOW (ref 3.5–5.1)
Sodium: 134 mmol/L — ABNORMAL LOW (ref 135–145)
Total Bilirubin: 0.5 mg/dL (ref 0.3–1.2)
Total Protein: 6.7 g/dL (ref 6.5–8.1)

## 2018-12-17 LAB — SARS CORONAVIRUS 2 BY RT PCR (HOSPITAL ORDER, PERFORMED IN ~~LOC~~ HOSPITAL LAB): SARS Coronavirus 2: NEGATIVE

## 2018-12-17 LAB — CBG MONITORING, ED: Glucose-Capillary: 187 mg/dL — ABNORMAL HIGH (ref 70–99)

## 2018-12-17 LAB — CK: Total CK: 88 U/L (ref 38–234)

## 2018-12-17 MED ORDER — NICOTINE 21 MG/24HR TD PT24
21.0000 mg | MEDICATED_PATCH | Freq: Every day | TRANSDERMAL | Status: DC
  Administered 2018-12-18 – 2018-12-21 (×4): 21 mg via TRANSDERMAL
  Filled 2018-12-17 (×4): qty 1

## 2018-12-17 MED ORDER — STROKE: EARLY STAGES OF RECOVERY BOOK
Freq: Once | Status: AC
  Administered 2018-12-18: 01:00:00
  Filled 2018-12-17: qty 1

## 2018-12-17 MED ORDER — INSULIN GLARGINE 100 UNIT/ML ~~LOC~~ SOLN
15.0000 [IU] | Freq: Every day | SUBCUTANEOUS | Status: DC
  Administered 2018-12-18 – 2018-12-21 (×4): 15 [IU] via SUBCUTANEOUS
  Filled 2018-12-17 (×5): qty 0.15

## 2018-12-17 MED ORDER — SENNOSIDES-DOCUSATE SODIUM 8.6-50 MG PO TABS
1.0000 | ORAL_TABLET | Freq: Every evening | ORAL | Status: DC | PRN
  Administered 2018-12-19: 1 via ORAL
  Filled 2018-12-17: qty 1

## 2018-12-17 MED ORDER — ENOXAPARIN SODIUM 40 MG/0.4ML ~~LOC~~ SOLN
40.0000 mg | Freq: Every day | SUBCUTANEOUS | Status: DC
  Administered 2018-12-18 – 2018-12-21 (×4): 40 mg via SUBCUTANEOUS
  Filled 2018-12-17 (×4): qty 0.4

## 2018-12-17 MED ORDER — POTASSIUM CHLORIDE CRYS ER 20 MEQ PO TBCR
40.0000 meq | EXTENDED_RELEASE_TABLET | Freq: Once | ORAL | Status: AC
  Administered 2018-12-18: 40 meq via ORAL
  Filled 2018-12-17: qty 2

## 2018-12-17 MED ORDER — SODIUM CHLORIDE 0.9 % IV SOLN
INTRAVENOUS | Status: DC
  Administered 2018-12-18 – 2018-12-19 (×3): via INTRAVENOUS

## 2018-12-17 MED ORDER — ACETAMINOPHEN 325 MG PO TABS: 650.0000 mg | ORAL_TABLET | ORAL | Status: DC | PRN

## 2018-12-17 MED ORDER — ACETAMINOPHEN 650 MG RE SUPP: 650.0000 mg | RECTAL | Status: DC | PRN

## 2018-12-17 MED ORDER — IPRATROPIUM-ALBUTEROL 0.5-2.5 (3) MG/3ML IN SOLN: 3.0000 mL | Freq: Four times a day (QID) | RESPIRATORY_TRACT | Status: DC | PRN

## 2018-12-17 MED ORDER — ACETAMINOPHEN 160 MG/5ML PO SOLN: 650.0000 mg | ORAL | Status: DC | PRN

## 2018-12-17 MED ORDER — INSULIN ASPART 100 UNIT/ML ~~LOC~~ SOLN
0.0000 [IU] | Freq: Three times a day (TID) | SUBCUTANEOUS | Status: DC
  Administered 2018-12-18: 1 [IU] via SUBCUTANEOUS
  Administered 2018-12-18: 2 [IU] via SUBCUTANEOUS
  Administered 2018-12-18: 1 [IU] via SUBCUTANEOUS
  Administered 2018-12-19 (×2): 2 [IU] via SUBCUTANEOUS
  Administered 2018-12-19: 1 [IU] via SUBCUTANEOUS
  Administered 2018-12-20 (×2): 2 [IU] via SUBCUTANEOUS
  Administered 2018-12-20 – 2018-12-21 (×2): 1 [IU] via SUBCUTANEOUS
  Administered 2018-12-21: 2 [IU] via SUBCUTANEOUS

## 2018-12-17 MED ORDER — INSULIN ASPART 100 UNIT/ML ~~LOC~~ SOLN: 0.0000 [IU] | Freq: Every day | SUBCUTANEOUS | Status: DC

## 2018-12-17 NOTE — ED Triage Notes (Signed)
Pt arrives to ED from home with complaints of being found on the floor after a possible fall around 1000 today. Pts daughter is at bedside and states that pt has had 5-6 strokes that has affected her speech and left sided weakness. Family noted that the pts left sided has gotten weaker with a LNK yesterday morning. No bruises or abrasions noted.

## 2018-12-17 NOTE — ED Notes (Signed)
Sent urine culture with specimen

## 2018-12-17 NOTE — H&P (Signed)
History and Physical    Crystal Moore WER:[id number] DOB: 16-Jun-1946 DOA: 12/17/2018  PCP: [name], MD Patient coming from: Home  Chief Complaint: Fall  HPI: Crystal Moore is a 73 y.o. female with medical history significant of arthritis, COPD, CVA with residual left hemiparesis, type 2 diabetes, seizures not on antiepileptics presenting to the hospital for evaluation after a fall.  Patient was oriented to person and place which appears to be her baseline per daughter at bedside.  She did not give me any additional history and denied any complaints.  Daughter states patient has had a stroke previously which left her with left-sided weakness. She has no strength in her left upper extremity since the previous stroke.  She does have weakness in her left lower extremity since the previous stroke but is still able to ambulate with a walker.  Daughter states this morning patient's granddaughter noticed that around 10 AM while trying to walk she fell because her left leg gave out and became completely limp.  Patient fell on her buttocks and did not hit her head.  Daughter states patient has otherwise been feeling well and has not been sick recently.  She has not had any complaints of fevers, chills, chest pain, shortness of breath, nausea, vomiting, abdominal pain, or diarrhea.  States patient is a heavy smoker and has a chronic cough, no recent change.  Daughter states patient was previously on a baby aspirin but it was stopped during her previous hospitalization last year.  She is not sure why.  Patient was admitted in November 2019 and found to have findings on the CT concerning for ischemic colitis.  Aspirin was held at that time with plan to follow-up with GI and PCP after discharge to determine when aspirin can be restarted.  ED Course: Afebrile and hemodynamically stable.  CBC unremarkable.  Potassium 3.4.  CK normal.  COVID-19 rapid test negative.  UA not suggestive of infection.   Chest x-ray without evidence of pneumonia or acute posttraumatic finding.  Pelvic x-ray negative for fracture.  CT head negative for acute finding.  CT C-spine negative for fracture or acute subluxation.  MRI brain showing several small foci of acute/early subacute infarction within the right ACA distribution.  No gross hemorrhage or mass-effect.  No medications administered.  Neurology consulted by ED provider.  Review of Systems:  All systems reviewed and apart from history of presenting illness, are negative.  Past Medical History:  Diagnosis Date   Arthritis    COPD (chronic obstructive pulmonary disease) ([place])    CVA (cerebral vascular accident) ([place])    Diabetes ([place])    Diabetes mellitus without complication ([place])    Hemiparesis affecting left side as late effect of cerebrovascular accident (CVA) ([place])    Pyelonephritis    Seizures ([place])    Stroke ([place])    Tobacco use    Tobacco use disorder    UTI (urinary tract infection)     Past Surgical History:  Procedure Laterality Date   TUBAL LIGATION       reports that she has been smoking cigarettes. She has a 40.00 pack-year smoking history. She has never used smokeless tobacco. She reports that she does not drink alcohol or use drugs.  No Known Allergies  Family History  Problem Relation Age of Onset   Diabetes Mother    Diabetes Sister    Diabetes Brother     Prior to Admission medications   Medication Sig Start Date End Date Taking?  Authorizing Provider  amLODipine (NORVASC) 5 MG tablet Take 5 mg by mouth daily. 12/16/18  Yes [provider]  LANTUS 100 UNIT/ML injection Take 15 Units by mouth daily. 11/28/18  Yes [provider]  metFORMIN (GLUCOPHAGE) 500 MG tablet Take 1,000 mg by mouth 2 (two) times a day.    Yes [provider]  simvastatin (ZOCOR) 40 MG tablet Take 40 mg by mouth daily. 10/27/18  Yes [provider]  albuterol (PROVENTIL HFA;VENTOLIN HFA) 108 (90  Base) MCG/ACT inhaler Inhale 2 puffs into the lungs every 6 (six) hours as needed for wheezing or shortness of breath. Patient not taking: Reported on 12/17/2018 01/12/18   Rigoberto Noel, MD  feeding supplement, ENSURE ENLIVE, (ENSURE ENLIVE) LIQD Take 237 mLs by mouth 2 (two) times daily between meals. Patient not taking: Reported on 12/17/2018 06/01/18   Dana Allan I, MD  Fluticasone-Umeclidin-Vilant (TRELEGY ELLIPTA) 100-62.5-25 MCG/INH AEPB Inhale 1 puff into the lungs daily. Patient not taking: Reported on 12/17/2018 01/12/18   Rigoberto Noel, MD  insulin degludec (TRESIBA FLEXTOUCH) 100 UNIT/ML SOPN FlexTouch Pen Inject 0.15 mLs (15 Units total) into the skin daily. And pen needles 1/day Patient not taking: Reported on 12/17/2018 06/10/18   Renato Shin, MD  nicotine (NICOTROL) 10 MG inhaler Inhale 1 Cartridge (1 continuous puffing total) into the lungs as needed for smoking cessation. Patient not taking: Reported on 12/17/2018 02/18/18   Rigoberto Noel, MD    Physical Exam: Vitals:   12/17/18 2130 12/17/18 2255 12/17/18 2321 12/17/18 2330  BP: (!) 146/91  (!) 139/99 135/68  Pulse: (!) 59     Resp: 14     Temp:  98.4 F (36.9 C)    TempSrc:  Oral    SpO2: (!) 88%       Physical Exam  Constitutional: No distress.  Resting comfortably in a stretcher.  No signs of distress.  HENT:  Head: Normocephalic.  Slightly dry mucous membranes  Eyes: Pupils are equal, round, and reactive to light. EOM are normal.  Neck: Neck supple.  Cardiovascular: Normal rate, regular rhythm and intact distal pulses.  Pulmonary/Chest: Effort normal and breath sounds normal. No respiratory distress. She has no wheezes. She has no rales.  Anterior lung fields clear to auscultation  Abdominal: Soft. Bowel sounds are normal. She exhibits no distension. There is no abdominal tenderness. There is no guarding.  Musculoskeletal:        General: No edema.  Neurological:  Awake and alert.  Oriented to person  and place only (baseline per daughter). Tongue midline, no facial droop. Strength 0 out of 5 in the left upper extremity.  Strength 5 out of 5 in the right upper extremity.  Strength 0 out of 5 in the left lower extremity.  Strength 5 out of 5 in the right lower extremity. Unable to assess sensation to light touch as patient did not answer any questions.  Skin: Skin is warm and dry. She is not diaphoretic.     Labs on Admission: I have personally reviewed following labs and imaging studies  CBC: Recent Labs  Lab 12/17/18 1735  WBC 9.8  NEUTROABS 6.7  HGB 13.5  HCT 39.9  MCV 80.6  PLT 607   Basic Metabolic Panel: Recent Labs  Lab 12/17/18 1735  NA 134*  K 3.4*  CL 97*  CO2 24  GLUCOSE 173*  BUN 9  CREATININE 0.64  CALCIUM 8.9   GFR: CrCl cannot be calculated (Unknown ideal weight.).  Liver Function Tests: Recent Labs  Lab 12/17/18 1735  AST 19  ALT 16  ALKPHOS 86  BILITOT 0.5  PROT 6.7  ALBUMIN 3.3*   No results for input(s): LIPASE, AMYLASE in the last 168 hours. No results for input(s): AMMONIA in the last 168 hours. Coagulation Profile: No results for input(s): INR, PROTIME in the last 168 hours. Cardiac Enzymes: Recent Labs  Lab 12/17/18 1735  CKTOTAL 88   BNP (last 3 results) No results for input(s): PROBNP in the last 8760 hours. HbA1C: No results for input(s): HGBA1C in the last 72 hours. CBG: Recent Labs  Lab 12/17/18 1605  GLUCAP 187*   Lipid Profile: No results for input(s): CHOL, HDL, LDLCALC, TRIG, CHOLHDL, LDLDIRECT in the last 72 hours. Thyroid Function Tests: No results for input(s): TSH, T4TOTAL, FREET4, T3FREE, THYROIDAB in the last 72 hours. Anemia Panel: No results for input(s): VITAMINB12, FOLATE, FERRITIN, TIBC, IRON, RETICCTPCT in the last 72 hours. Urine analysis:    Component Value Date/Time   COLORURINE YELLOW 12/17/2018 2004   APPEARANCEUR CLEAR 12/17/2018 2004   LABSPEC 1.014 12/17/2018 2004   PHURINE 8.0  12/17/2018 2004   GLUCOSEU 50 (A) 12/17/2018 2004   HGBUR NEGATIVE 12/17/2018 2004   BILIRUBINUR NEGATIVE 12/17/2018 2004   [place] NEGATIVE 12/17/2018 2004   PROTEINUR NEGATIVE 12/17/2018 2004   NITRITE NEGATIVE 12/17/2018 2004   LEUKOCYTESUR NEGATIVE 12/17/2018 2004    Radiological Exams on Admission: Ct Head Wo Contrast  Result Date: 12/17/2018 CLINICAL DATA:  Found down after possible fall this morning. Previous strokes. EXAM: CT HEAD WITHOUT CONTRAST CT CERVICAL SPINE WITHOUT CONTRAST TECHNIQUE: Multidetector CT imaging of the head and cervical spine was performed following the standard protocol without intravenous contrast. Multiplanar CT image reconstructions of the cervical spine were also generated. COMPARISON:  None. FINDINGS: CT HEAD FINDINGS Brain: Moderate ventriculomegaly related to generalized parenchymal volume loss. Chronic small vessel ischemic changes are seen throughout the bilateral periventricular and subcortical white matter regions. Chronic lacunar infarcts are present within the bilateral basal ganglia regions, RIGHT greater than LEFT. Small old infarct also noted within the RIGHT cerebellum. There is no mass, hemorrhage, mass effect or other signs of acute parenchymal abnormality. Vascular: Chronic calcified atherosclerotic changes of the large vessels at the skull base. No unexpected hyperdense vessel. Skull: Normal. Negative for fracture or focal lesion. Sinuses/Orbits: No acute finding. Other: None. CT CERVICAL SPINE FINDINGS Alignment: No evidence of acute vertebral body subluxation. Skull base and vertebrae: No fracture line or displaced fracture fragment appreciated. Facet joints appear normally aligned throughout. Soft tissues and spinal canal: No prevertebral fluid or swelling. No visible canal hematoma. Disc levels: Degenerative changes of the mid and lower cervical spine, mild to moderate in degree with associated disc space narrowings and osseous spurring.  Associated disc-osteophytic bulge at the C5-6 level causing moderate central canal stenosis with possible mass effect on the anterior portion of the cervical cord. No more than mild central canal stenosis at the remainder of the cervical levels. Upper chest: No acute findings. Other: Bilateral carotid atherosclerosis. IMPRESSION: 1. No acute intracranial abnormality. No intracranial mass, hemorrhage or edema. No skull fracture. Atrophy and chronic ischemic changes, as detailed above. 2. No fracture or acute subluxation within the cervical spine. Degenerative changes of the cervical spine, mild to moderate in degree, as detailed above. 3. Carotid atherosclerosis. Electronically Signed   By: Franki Cabot M.D.   On: 12/17/2018 18:08   Ct Cervical Spine Wo Contrast  Result Date: 12/17/2018 CLINICAL  DATA:  Found down after possible fall this morning. Previous strokes. EXAM: CT HEAD WITHOUT CONTRAST CT CERVICAL SPINE WITHOUT CONTRAST TECHNIQUE: Multidetector CT imaging of the head and cervical spine was performed following the standard protocol without intravenous contrast. Multiplanar CT image reconstructions of the cervical spine were also generated. COMPARISON:  None. FINDINGS: CT HEAD FINDINGS Brain: Moderate ventriculomegaly related to generalized parenchymal volume loss. Chronic small vessel ischemic changes are seen throughout the bilateral periventricular and subcortical white matter regions. Chronic lacunar infarcts are present within the bilateral basal ganglia regions, RIGHT greater than LEFT. Small old infarct also noted within the RIGHT cerebellum. There is no mass, hemorrhage, mass effect or other signs of acute parenchymal abnormality. Vascular: Chronic calcified atherosclerotic changes of the large vessels at the skull base. No unexpected hyperdense vessel. Skull: Normal. Negative for fracture or focal lesion. Sinuses/Orbits: No acute finding. Other: None. CT CERVICAL SPINE FINDINGS Alignment: No  evidence of acute vertebral body subluxation. Skull base and vertebrae: No fracture line or displaced fracture fragment appreciated. Facet joints appear normally aligned throughout. Soft tissues and spinal canal: No prevertebral fluid or swelling. No visible canal hematoma. Disc levels: Degenerative changes of the mid and lower cervical spine, mild to moderate in degree with associated disc space narrowings and osseous spurring. Associated disc-osteophytic bulge at the C5-6 level causing moderate central canal stenosis with possible mass effect on the anterior portion of the cervical cord. No more than mild central canal stenosis at the remainder of the cervical levels. Upper chest: No acute findings. Other: Bilateral carotid atherosclerosis. IMPRESSION: 1. No acute intracranial abnormality. No intracranial mass, hemorrhage or edema. No skull fracture. Atrophy and chronic ischemic changes, as detailed above. 2. No fracture or acute subluxation within the cervical spine. Degenerative changes of the cervical spine, mild to moderate in degree, as detailed above. 3. Carotid atherosclerosis. Electronically Signed   By: Franki Cabot M.D.   On: 12/17/2018 18:08   Mr Brain Wo Contrast  Result Date: 12/17/2018 CLINICAL DATA:  73 y/o F; found down after possible fall. Focal neuro deficit, > 6 hrs, stroke suspected. EXAM: MRI HEAD WITHOUT CONTRAST TECHNIQUE: Axial and coronal DWI sequences were acquired. Patient was unable to continue the examination and additional sequences were not acquired. COMPARISON:  12/17/2018 CT of the head. FINDINGS: There are several small foci of reduced diffusion within the right caudate head and the right ACA distribution of medial frontal lobe compatible with acute/early subacute infarction. No associated gross hemorrhage or mass effect. Very small chronic infarction within the right cerebellum. Confluent white matter hyperintensities are compatible with advanced chronic microvascular  ischemic changes and there is diffuse volume loss of the brain. No herniation. IMPRESSION: 1. DWI sequences were acquired.  Limited study. 2. Several small foci of acute/early subacute infarction within the right ACA distribution. No gross hemorrhage or mass effect. These results were called by telephone at the time of interpretation on 12/17/2018 at 10:25 pm to Dr. Trinidad Curet , who verbally acknowledged these results. Electronically Signed   By: Kristine Garbe M.D.   On: 12/17/2018 22:27   Dg Pelvis Portable  Result Date: 12/17/2018 CLINICAL DATA:  Fall. EXAM: PORTABLE PELVIS 1-2 VIEWS COMPARISON:  None. FINDINGS: The bones appear adequately mineralized. No evidence of acute fracture or dislocation. There are mild degenerative changes of the hips and sacroiliac joints. Iliofemoral atherosclerosis noted. IMPRESSION: No evidence of acute pelvic injury. Electronically Signed   By: Richardean Sale M.D.   On: 12/17/2018 17:22  Dg Chest Portable 1 View  Result Date: 12/17/2018 CLINICAL DATA:  Fall. EXAM: PORTABLE CHEST 1 VIEW COMPARISON:  Radiographs 01/12/2018.  CT 02/15/2018. FINDINGS: 1642 hours. Patient is mildly rotated to the right. The heart size and mediastinal contours are stable with aortic atherosclerosis. There is chronic fibrotic interstitial lung disease with mild superimposed atelectasis at the right lung base. No confluent airspace opacity, pleural effusion or pneumothorax identified. There is a small calcified right lung granuloma. No evidence of acute fracture. Telemetry leads overlie the chest. IMPRESSION: No acute posttraumatic findings in the chest. Mild right lower lobe atelectasis superimposed on chronic fibrotic interstitial lung disease. Electronically Signed   By: Richardean Sale M.D.   On: 12/17/2018 17:21    EKG: Independently reviewed.  Sinus rhythm, PVCs.  Assessment/Plan Principal Problem:   Acute CVA (cerebrovascular accident) ([place]) Active Problems:   COPD  (chronic obstructive pulmonary disease) ([place])   Tobacco abuse   Type 2 diabetes mellitus ([place])   Hypokalemia   Acute CVA Patient has a history of prior stroke and left hemiparesis.  Now presenting with significantly worsening weakness in her left lower extremity from baseline.  Aspirin was temporarily held during her previous hospitalization in November 2019 and it appears has not been restarted since then. MRI brain showing several small foci of acute/early subacute infarction within the right ACA distribution.  No gross hemorrhage or mass-effect.  -Hemoglobin currently stable at 13.5.  No complaints of melena or hematochezia.  Start aspirin 325 mg daily. -High intensity statin -Cardiac monitoring -Frequent neurochecks -PT/OT/SLP -A1c, lipid panel -Permissive hypertension -Carotid Dopplers -CTA head and neck -Discussed with neurology, will consult  Mild hypokalemia Potassium 3.4.   -Replete potassium.  Check magnesium level.  Continue to monitor BMP.  COPD -Stable.  No wheezing or shortness of breath.  DuoNebs PRN.  Type 2 diabetes -Check A1c.  Continue home Lantus 15 units daily.  Sliding scale insulin and CBG checks.  Tobacco use -Counseling -NicoDerm patch  DVT prophylaxis: Lovenox Code Status: Full code.  Discussed with patient and daughter at bedside. Family Communication: Daughter at bedside. Disposition Plan: Anticipate discharge after clinical improvement. Consults called: Neurology (Dr. Lorraine Lax) Admission status: It is my clinical opinion that admission to INPATIENT is reasonable and necessary in this 73 y.o. female  presenting with symptoms of worsening left lower extremity weakness concerning for acute stroke  in the context of PMH including: Prior stroke currently not on an antiplatelet agent  with pertinent positives on physical exam including: Strength 0 out of 5 in the left upper extremity and left lower extremity  and pertinent positives on radiographic and  laboratory data including: Brain MRI with evidence of acute stroke.  Workup and treatment include stroke work-up mentioned above.  Patient will need evaluation by PT/OT/SLP.  Anticipate placement to rehab facility given left hemiparesis.  Given the aforementioned, the predictability of an adverse outcome is felt to be significant. I expect that the patient will require at least 2 midnights in the hospital to treat this condition.   The medical decision making on this patient was of high complexity and the patient is at high risk for clinical deterioration, therefore this is a level 3 visit.  Shela Leff MD [facility] Pager [phone]  If 7PM-7AM, please contact night-coverage [email or web address] Password [email or web address]  12/18/2018, 12:08 AM

## 2018-12-17 NOTE — ED Notes (Signed)
Admitting MD at bedside.

## 2018-12-17 NOTE — ED Notes (Signed)
Pt to MRI via stretcher.

## 2018-12-17 NOTE — ED Notes (Signed)
Per MRI pt has two ahead of her, one being a double study. Pt resting on stretcher with only complaint of being hungry. Family at bedside.

## 2018-12-17 NOTE — ED Provider Notes (Signed)
[facility] EMERGENCY DEPARTMENT Provider Note   CSN: [id number] Arrival date & time: 12/17/18  1546    History   Chief Complaint Chief Complaint  Patient presents with  . Fall    HPI Crystal Moore is a 73 y.o. female.  HPI 73 year old female with a history of COPD, CVA with residual left-sided weakness, DM presents for evaluation for fall.  Patient has had more frequent falls over the past 3 to 4 days.  She was found on this morning around 10 AM.  Her daughter is at bedside and states that she has worsening left lower extremity weakness as of this morning and is concerned that she is having a another stroke.  No recent illnesses, fever.  Patient denies cough, shortness of breath, abdominal pain, or urinary symptoms.  She does report mild frontal headache and buttock pain after falling yesterday.  Patient ambulates with a cane at baseline but as of this morning has been unable to walk.  Past Medical History:  Diagnosis Date  . Arthritis   . COPD (chronic obstructive pulmonary disease) ([place])   . CVA (cerebral vascular accident) ([place])   . Diabetes ([place])   . Diabetes mellitus without complication ([place])   . Hemiparesis affecting left side as late effect of cerebrovascular accident (CVA) ([place])   . Pyelonephritis   . Seizures ([place])   . Stroke ([place])   . Tobacco use   . Tobacco use disorder   . UTI (urinary tract infection)     Patient Active Problem List   Diagnosis Date Noted  . Hypokalemia 12/18/2018  . Acute CVA (cerebrovascular accident) ([place]) 12/17/2018  . Malnutrition of moderate degree 05/31/2018  . Hematochezia 05/29/2018  . Ischemic colitis ([place]) 05/29/2018  . Acute encephalopathy 05/29/2018  . Type 2 diabetes mellitus ([place]) 05/29/2018  . Hypertension 05/29/2018  . Depression 05/29/2018  . COPD (chronic obstructive pulmonary disease) ([place]) 05/29/2018  . Sepsis ([place]) 05/28/2018  . Diabetes ([place]) 03/16/2018  . ILD (interstitial lung disease)  ([place]) 02/18/2018  . Chronic respiratory failure with hypoxia ([place]) 02/18/2018  . COPD (chronic obstructive pulmonary disease) ([place]) 01/12/2018  . Tobacco abuse 01/12/2018    Past Surgical History:  Procedure Laterality Date  . TUBAL LIGATION       OB History   No obstetric history on file.      Home Medications    Prior to Admission medications   Medication Sig Start Date End Date Taking? Authorizing Provider  amLODipine (NORVASC) 5 MG tablet Take 5 mg by mouth daily. 12/16/18  Yes [provider]  LANTUS 100 UNIT/ML injection Take 15 Units by mouth daily. 11/28/18  Yes [provider]  metFORMIN (GLUCOPHAGE) 500 MG tablet Take 1,000 mg by mouth 2 (two) times a day.    Yes [provider]  simvastatin (ZOCOR) 40 MG tablet Take 40 mg by mouth daily. 10/27/18  Yes [provider]  albuterol (PROVENTIL HFA;VENTOLIN HFA) 108 (90 Base) MCG/ACT inhaler Inhale 2 puffs into the lungs every 6 (six) hours as needed for wheezing or shortness of breath. Patient not taking: Reported on 12/17/2018 01/12/18   Rigoberto Noel, MD  feeding supplement, ENSURE ENLIVE, (ENSURE ENLIVE) LIQD Take 237 mLs by mouth 2 (two) times daily between meals. Patient not taking: Reported on 12/17/2018 06/01/18   Dana Allan I, MD  Fluticasone-Umeclidin-Vilant (TRELEGY ELLIPTA) 100-62.5-25 MCG/INH AEPB Inhale 1 puff into the lungs daily. Patient not taking: Reported on 12/17/2018 01/12/18   Rigoberto Noel,  MD  insulin degludec (TRESIBA FLEXTOUCH) 100 UNIT/ML SOPN FlexTouch Pen Inject 0.15 mLs (15 Units total) into the skin daily. And pen needles 1/day Patient not taking: Reported on 12/17/2018 06/10/18   Renato Shin, MD  nicotine (NICOTROL) 10 MG inhaler Inhale 1 Cartridge (1 continuous puffing total) into the lungs as needed for smoking cessation. Patient not taking: Reported on 12/17/2018 02/18/18   Rigoberto Noel, MD    Family History Family History  Problem Relation Age of Onset   . Diabetes Mother   . Diabetes Sister   . Diabetes Brother     Social History Social History   Tobacco Use  . Smoking status: Current Every Day Smoker    Packs/day: 1.00    Years: 40.00    Pack years: 40.00    Types: Cigarettes  . Smokeless tobacco: Never Used  Substance Use Topics  . Alcohol use: Never    Frequency: Never  . Drug use: Never     Allergies   Patient has no known allergies.   Review of Systems Review of Systems  Constitutional: Negative for chills and fever.  HENT: Negative for ear pain and sore throat.   Eyes: Negative for pain and visual disturbance.  Respiratory: Negative for cough and shortness of breath.   Cardiovascular: Negative for chest pain and palpitations.  Gastrointestinal: Negative for abdominal pain and vomiting.  Genitourinary: Negative for dysuria and hematuria.  Musculoskeletal: Negative for arthralgias and back pain.       Buttock pain  Skin: Negative for color change and rash.  Neurological: Positive for headaches. Negative for seizures and syncope.  All other systems reviewed and are negative.    Physical Exam Updated Vital Signs BP 135/68   Pulse (!) 59   Temp 98.4 F (36.9 C) (Oral)   Resp 14   SpO2 (!) 88%   Physical Exam Vitals signs and nursing note reviewed.  Constitutional:      General: She is not in acute distress.    Appearance: She is well-developed.  HENT:     Head: Normocephalic and atraumatic.  Eyes:     Conjunctiva/sclera: Conjunctivae normal.  Neck:     Musculoskeletal: Neck supple.     Comments: No cervical tenderness Cardiovascular:     Rate and Rhythm: Normal rate and regular rhythm.     Heart sounds: No murmur.  Pulmonary:     Effort: Pulmonary effort is normal. No respiratory distress.     Breath sounds: Normal breath sounds.  Abdominal:     Palpations: Abdomen is soft.     Tenderness: There is no abdominal tenderness.  Skin:    General: Skin is warm and dry.  Neurological:     Mental  Status: She is alert.     Comments:  Mental Status:  Orientation: Alert and oriented to person, place, but not time.  Memory: Cooperative, follows commands well. Recent and remote memory normal.  Attention, Concentration: Attention span and concentration are normal.  Language: Speech is clear and language is normal.  Fund of Knowledge: Aware of current events, vocabulary appropriate for patient age.  Cranial Nerves   II Visual Fields:  Intact to confrontation III, IV, VI:  Pupils equal and reactive to light and near. Full eye movement without nystagmus  V Facial Sensation:  Normal. No weakness of masticatory muscles  VII:  No facial weakness or asymmetry  VIII Auditory Acuity:  Grossly normal  IX/X:  The uvula is midline; the palate elevates symmetrically  XI:  Normal sternocleidomastoid and trapezius strength  XII:  The tongue is midline. No atrophy or fasciculations.    Motor System:  Muscle Strength: 0/5 strength in the left upper and lower extremity, 5/5 strength in the right upper extremity, 4/5 in the right lower extremity.   Muscle Tone: Tone and muscle bulk are normal in the upper and lower extremities.   Coordination:  Intact finger-to-nose on the right. Sensation:  Intact to light touch Gait:  Deferred         ED Treatments / Results  Labs (all labs ordered are listed, but only abnormal results are displayed) Labs Reviewed  COMPREHENSIVE METABOLIC PANEL - Abnormal; Notable for the following components:      Result Value   Sodium 134 (*)    Potassium 3.4 (*)    Chloride 97 (*)    Glucose, Bld 173 (*)    Albumin 3.3 (*)    All other components within normal limits  URINALYSIS, ROUTINE W REFLEX MICROSCOPIC - Abnormal; Notable for the following components:   Glucose, UA 50 (*)    All other components within normal limits  CBG MONITORING, ED - Abnormal; Notable for the following components:   Glucose-Capillary 187 (*)    All other components within normal limits   SARS CORONAVIRUS 2 (HOSPITAL ORDER, [facility])  CBC WITH DIFFERENTIAL/PLATELET  CK  HEMOGLOBIN A1C  LIPID PANEL  MAGNESIUM  BASIC METABOLIC PANEL    EKG EKG Interpretation  Date/Time:  Saturday Dec 17 2018 16:02:29 EDT Ventricular Rate:  76 PR Interval:    QRS Duration: 98 QT Interval:  375 QTC Calculation: 422 R Axis:   71 Text Interpretation:  Sinus rhythm Ventricular premature complex No old tracing to compare Confirmed by Malvin Johns [phone]) on 12/17/2018 4:36:10 PM   Radiology   Procedures Procedures (including critical care time)  Medications Ordered in ED Medications  insulin glargine (LANTUS) injection 15 Units (has no administration in time range)   stroke: mapping our early stages of recovery book (has no administration in time range)  0.9 %  sodium chloride infusion (has no administration in time range)  acetaminophen (TYLENOL) tablet 650 mg (has no administration in time range)    Or  acetaminophen (TYLENOL) solution 650 mg (has no administration in time range)    Or  acetaminophen (TYLENOL) suppository 650 mg (has no administration in time range)  senna-docusate (Senokot-S) tablet 1 tablet (has no administration in time range)  enoxaparin (LOVENOX) injection 40 mg (has no administration in time range)  nicotine (NICODERM CQ - dosed in mg/24 hours) patch 21 mg (has no administration in time range)  potassium chloride SA (K-DUR) CR tablet 40 mEq (has no administration in time range)  insulin aspart (novoLOG) injection 0-9 Units (has no administration in time range)  insulin aspart (novoLOG) injection 0-5 Units (has no administration in time range)  ipratropium-albuterol (DUONEB) 0.5-2.5 (3) MG/3ML nebulizer solution 3 mL (has no administration in time range)     Initial Impression / Assessment and Plan / ED Course  I have reviewed the triage vital signs and the nursing notes.  Pertinent labs & imaging results that were  available during my care of the patient were reviewed by me and considered in my medical decision making (see chart for details).  73 year old female with a history of COPD, CVA with residual left-sided weakness, DM presents for evaluation for fall.  Hemodynamically stable.  Afebrile.  On exam, patient's oriented to person and place  only.  She has left-sided hemiparesis.  No gaze deviation.  Cranial nerves intact.  Benign abdominal exam.   CT of the head and C-spine shows no acute intracranial abnormality or fractures of the cervical spine.  Chest x-ray shows no acute findings.  SARS-CoV-2 negative.  CBC unremarkable.  CMP notable for sodium 134, potassium 3.4, glucose 173.  CK 88.  Unremarkable.  Neurology consulted and MRI recommended.  MRI shows several small foci of acute/early subacute infarction within the right ACA distribution.  No gross hemorrhage or mass-effect.   MRI results were discussed with neurology who recommended hospitalist admission.   Final Clinical Impressions(s) / ED Diagnoses   Final diagnoses:  Cerebrovascular accident (CVA), unspecified mechanism [facility])    ED Discharge Orders    None       Trinidad Curet, MD 12/18/18 3582    Malvin Johns, MD 12/18/18 1230

## 2018-12-17 NOTE — ED Notes (Signed)
ED TO INPATIENT HANDOFF REPORT  ED Nurse Name and Phone #: [phone]  S Name/Age/Gender Crystal Moore 73 y.o. female Room/Bed: 024C/024C  Code Status   Code Status: Prior  Home/SNF/Other Home Patient oriented to: self, place, time and situation Is this baseline? Yes   Triage Complete: Triage complete  Chief Complaint found on floor  Triage Note Pt arrives to ED from home with complaints of being found on the floor after a possible fall around 1000 today. Pts daughter is at bedside and states that pt has had 5-6 strokes that has affected her speech and left sided weakness. Family noted that the pts left sided has gotten weaker with a LNK yesterday morning. No bruises or abrasions noted.    Allergies No Known Allergies  Level of Care/Admitting Diagnosis ED Disposition    ED Disposition Condition Comment   Admit  Hospital Area: [facility] [[id number]]  Level of Care: Telemetry Medical [104]  Covid Evaluation: N/A  Diagnosis: Acute CVA (cerebrovascular accident) [facility]) [[id number]]  Admitting Physician: Shela Leff [[id number]]  Attending Physician: Shela Leff [[id number]]  Estimated length of stay: past midnight tomorrow  Certification:: I certify this patient will need inpatient services for at least 2 midnights  PT Class (Do Not Modify): Inpatient [101]  PT Acc Code (Do Not Modify): Private [1]       B Medical/Surgery History Past Medical History:  Diagnosis Date  . Arthritis   . COPD (chronic obstructive pulmonary disease) ([place])   . CVA (cerebral vascular accident) ([place])   . Diabetes ([place])   . Diabetes mellitus without complication ([place])   . Hemiparesis affecting left side as late effect of cerebrovascular accident (CVA) ([place])   . Pyelonephritis   . Seizures ([place])   . Stroke ([place])   . Tobacco use   . Tobacco use disorder   . UTI (urinary tract infection)    Past Surgical History:  Procedure Laterality Date  . TUBAL LIGATION        A IV Location/Drains/Wounds Patient Lines/Drains/Airways Status   Active Line/Drains/Airways    Name:   Placement date:   Placement time:   Site:   Days:   Peripheral IV 05/30/18 Anterior;Left Forearm   05/30/18    1900    Forearm   201   External Urinary Catheter   12/17/18    1817    -   less than 1          Intake/Output Last 24 hours No intake or output data in the 24 hours ending 12/17/18 2329  Labs/Imaging Results for orders placed or performed during the hospital encounter of 12/17/18 (from the past 48 hour(s))  CBG monitoring, ED     Status: Abnormal   Collection Time: 12/17/18  4:05 PM  Result Value Ref Range   Glucose-Capillary 187 (H) 70 - 99 mg/dL  SARS Coronavirus 2 (CEPHEID - Performed in [facility] hospital lab), Hosp Order     Status: None   Collection Time: 12/17/18  5:07 PM  Result Value Ref Range   SARS Coronavirus 2 NEGATIVE NEGATIVE    Comment: (NOTE) If result is NEGATIVE SARS-CoV-2 target nucleic acids are NOT DETECTED. The SARS-CoV-2 RNA is generally detectable in upper and lower  respiratory specimens during the acute phase of infection. The lowest  concentration of SARS-CoV-2 viral copies this assay can detect is 250  copies / mL. A negative result does not preclude SARS-CoV-2 infection  and should not be used as the sole  basis for treatment or other  patient management decisions.  A negative result may occur with  improper specimen collection / handling, submission of specimen other  than nasopharyngeal swab, presence of viral mutation(s) within the  areas targeted by this assay, and inadequate number of viral copies  (<250 copies / mL). A negative result must be combined with clinical  observations, patient history, and epidemiological information. If result is POSITIVE SARS-CoV-2 target nucleic acids are DETECTED. The SARS-CoV-2 RNA is generally detectable in upper and lower  respiratory specimens dur ing the acute phase of infection.   Positive  results are indicative of active infection with SARS-CoV-2.  Clinical  correlation with patient history and other diagnostic information is  necessary to determine patient infection status.  Positive results do  not rule out bacterial infection or co-infection with other viruses. If result is PRESUMPTIVE POSTIVE SARS-CoV-2 nucleic acids MAY BE PRESENT.   A presumptive positive result was obtained on the submitted specimen  and confirmed on repeat testing.  While 2019 novel coronavirus  (SARS-CoV-2) nucleic acids may be present in the submitted sample  additional confirmatory testing may be necessary for epidemiological  and / or clinical management purposes  to differentiate between  SARS-CoV-2 and other Sarbecovirus currently known to infect humans.  If clinically indicated additional testing with an alternate test  methodology [phone]) is advised. The SARS-CoV-2 RNA is generally  detectable in upper and lower respiratory sp ecimens during the acute  phase of infection. The expected result is Negative. Fact Sheet for Patients:  StrictlyIdeas.no Fact Sheet for Healthcare Providers: BankingDealers.co.za This test is not yet approved or cleared by the [place] FDA and has been authorized for detection and/or diagnosis of SARS-CoV-2 by FDA under an Emergency Use Authorization (EUA).  This EUA will remain in effect (meaning this test can be used) for the duration of the COVID-19 declaration under Section 564(b)(1) of the Act, 21 U.S.C. section 360bbb-3(b)(1), unless the authorization is terminated or revoked sooner. Performed at [facility], [place] [address], [place], [place] [address]   CBC with Differential     Status: None   Collection Time: 12/17/18  5:35 PM  Result Value Ref Range   WBC 9.8 4.0 - 10.5 K/uL   RBC 4.95 3.87 - 5.11 MIL/uL   Hemoglobin 13.5 12.0 - 15.0 g/dL   HCT 39.9 36.0 - 46.0 %   MCV 80.6 80.0  - 100.0 fL   MCH 27.3 26.0 - 34.0 pg   MCHC 33.8 30.0 - 36.0 g/dL   RDW 14.4 11.5 - 15.5 %   Platelets 208 150 - 400 K/uL   nRBC 0.0 0.0 - 0.2 %   Neutrophils Relative % 68 %   Neutro Abs 6.7 1.7 - 7.7 K/uL   Lymphocytes Relative 24 %   Lymphs Abs 2.3 0.7 - 4.0 K/uL   Monocytes Relative 6 %   Monocytes Absolute 0.6 0.1 - 1.0 K/uL   Eosinophils Relative 2 %   Eosinophils Absolute 0.2 0.0 - 0.5 K/uL   Basophils Relative 0 %   Basophils Absolute 0.0 0.0 - 0.1 K/uL   Immature Granulocytes 0 %   Abs Immature Granulocytes 0.04 0.00 - 0.07 K/uL    Comment: Performed at [facility], [address] [place], [place] [address]  Comprehensive metabolic panel     Status: Abnormal   Collection Time: 12/17/18  5:35 PM  Result Value Ref Range   Sodium 134 (L) 135 - 145 mmol/L  Potassium 3.4 (L) 3.5 - 5.1 mmol/L   Chloride 97 (L) 98 - 111 mmol/L   CO2 24 22 - 32 mmol/L   Glucose, Bld 173 (H) 70 - 99 mg/dL   BUN 9 8 - 23 mg/dL   Creatinine, Ser 0.64 0.44 - 1.00 mg/dL   Calcium 8.9 8.9 - 10.3 mg/dL   Total Protein 6.7 6.5 - 8.1 g/dL   Albumin 3.3 (L) 3.5 - 5.0 g/dL   AST 19 15 - 41 U/L   ALT 16 0 - 44 U/L   Alkaline Phosphatase 86 38 - 126 U/L   Total Bilirubin 0.5 0.3 - 1.2 mg/dL   GFR calc non Af Amer >60 >60 mL/min   GFR calc Af Amer >60 >60 mL/min   Anion gap 13 5 - 15    Comment: Performed at [facility] [address], [place], [place] [address]  CK     Status: None   Collection Time: 12/17/18  5:35 PM  Result Value Ref Range   Total CK 88 38 - 234 U/L    Comment: Performed at [facility], [place] [address] [place], [place] [address]  Urinalysis, Routine w reflex microscopic     Status: Abnormal   Collection Time: 12/17/18  8:04 PM  Result Value Ref Range   Color, Urine YELLOW YELLOW   APPearance CLEAR CLEAR   Specific Gravity, Urine 1.014 1.005 - 1.030   pH 8.0 5.0 - 8.0   Glucose, UA 50 (A) NEGATIVE mg/dL   Hgb urine dipstick NEGATIVE NEGATIVE    Bilirubin Urine NEGATIVE NEGATIVE   Ketones, ur NEGATIVE NEGATIVE mg/dL   Protein, ur NEGATIVE NEGATIVE mg/dL   Nitrite NEGATIVE NEGATIVE   Leukocytes,Ua NEGATIVE NEGATIVE    Comment: Performed at [facility] [address], [place], [place] [address]   Ct Head Wo Contrast  Result Date: 12/17/2018 CLINICAL DATA:  Found down after possible fall this morning. Previous strokes. EXAM: CT HEAD WITHOUT CONTRAST CT CERVICAL SPINE WITHOUT CONTRAST TECHNIQUE: Multidetector CT imaging of the head and cervical spine was performed following the standard protocol without intravenous contrast. Multiplanar CT image reconstructions of the cervical spine were also generated. COMPARISON:  None. FINDINGS: CT HEAD FINDINGS Brain: Moderate ventriculomegaly related to generalized parenchymal volume loss. Chronic small vessel ischemic changes are seen throughout the bilateral periventricular and subcortical white matter regions. Chronic lacunar infarcts are present within the bilateral basal ganglia regions, RIGHT greater than LEFT. Small old infarct also noted within the RIGHT cerebellum. There is no mass, hemorrhage, mass effect or other signs of acute parenchymal abnormality. Vascular: Chronic calcified atherosclerotic changes of the large vessels at the skull base. No unexpected hyperdense vessel. Skull: Normal. Negative for fracture or focal lesion. Sinuses/Orbits: No acute finding. Other: None. CT CERVICAL SPINE FINDINGS Alignment: No evidence of acute vertebral body subluxation. Skull base and vertebrae: No fracture line or displaced fracture fragment appreciated. Facet joints appear normally aligned throughout. Soft tissues and spinal canal: No prevertebral fluid or swelling. No visible canal hematoma. Disc levels: Degenerative changes of the mid and lower cervical spine, mild to moderate in degree with associated disc space narrowings and osseous spurring. Associated disc-osteophytic bulge at the C5-6 level  causing moderate central canal stenosis with possible mass effect on the anterior portion of the cervical cord. No more than mild central canal stenosis at the remainder of the cervical levels. Upper chest: No acute findings. Other: Bilateral carotid atherosclerosis. IMPRESSION: 1. No acute  intracranial abnormality. No intracranial mass, hemorrhage or edema. No skull fracture. Atrophy and chronic ischemic changes, as detailed above. 2. No fracture or acute subluxation within the cervical spine. Degenerative changes of the cervical spine, mild to moderate in degree, as detailed above. 3. Carotid atherosclerosis. Electronically Signed   By: Franki Cabot M.D.   On: 12/17/2018 18:08   Ct Cervical Spine Wo Contrast  Result Date: 12/17/2018 CLINICAL DATA:  Found down after possible fall this morning. Previous strokes. EXAM: CT HEAD WITHOUT CONTRAST CT CERVICAL SPINE WITHOUT CONTRAST TECHNIQUE: Multidetector CT imaging of the head and cervical spine was performed following the standard protocol without intravenous contrast. Multiplanar CT image reconstructions of the cervical spine were also generated. COMPARISON:  None. FINDINGS: CT HEAD FINDINGS Brain: Moderate ventriculomegaly related to generalized parenchymal volume loss. Chronic small vessel ischemic changes are seen throughout the bilateral periventricular and subcortical white matter regions. Chronic lacunar infarcts are present within the bilateral basal ganglia regions, RIGHT greater than LEFT. Small old infarct also noted within the RIGHT cerebellum. There is no mass, hemorrhage, mass effect or other signs of acute parenchymal abnormality. Vascular: Chronic calcified atherosclerotic changes of the large vessels at the skull base. No unexpected hyperdense vessel. Skull: Normal. Negative for fracture or focal lesion. Sinuses/Orbits: No acute finding. Other: None. CT CERVICAL SPINE FINDINGS Alignment: No evidence of acute vertebral body subluxation. Skull base  and vertebrae: No fracture line or displaced fracture fragment appreciated. Facet joints appear normally aligned throughout. Soft tissues and spinal canal: No prevertebral fluid or swelling. No visible canal hematoma. Disc levels: Degenerative changes of the mid and lower cervical spine, mild to moderate in degree with associated disc space narrowings and osseous spurring. Associated disc-osteophytic bulge at the C5-6 level causing moderate central canal stenosis with possible mass effect on the anterior portion of the cervical cord. No more than mild central canal stenosis at the remainder of the cervical levels. Upper chest: No acute findings. Other: Bilateral carotid atherosclerosis. IMPRESSION: 1. No acute intracranial abnormality. No intracranial mass, hemorrhage or edema. No skull fracture. Atrophy and chronic ischemic changes, as detailed above. 2. No fracture or acute subluxation within the cervical spine. Degenerative changes of the cervical spine, mild to moderate in degree, as detailed above. 3. Carotid atherosclerosis. Electronically Signed   By: Franki Cabot M.D.   On: 12/17/2018 18:08   Mr Brain Wo Contrast  Result Date: 12/17/2018 CLINICAL DATA:  73 y/o F; found down after possible fall. Focal neuro deficit, > 6 hrs, stroke suspected. EXAM: MRI HEAD WITHOUT CONTRAST TECHNIQUE: Axial and coronal DWI sequences were acquired. Patient was unable to continue the examination and additional sequences were not acquired. COMPARISON:  12/17/2018 CT of the head. FINDINGS: There are several small foci of reduced diffusion within the right caudate head and the right ACA distribution of medial frontal lobe compatible with acute/early subacute infarction. No associated gross hemorrhage or mass effect. Very small chronic infarction within the right cerebellum. Confluent white matter hyperintensities are compatible with advanced chronic microvascular ischemic changes and there is diffuse volume loss of the brain.  No herniation. IMPRESSION: 1. DWI sequences were acquired.  Limited study. 2. Several small foci of acute/early subacute infarction within the right ACA distribution. No gross hemorrhage or mass effect. These results were called by telephone at the time of interpretation on 12/17/2018 at 10:25 pm to Dr. Trinidad Curet , who verbally acknowledged these results. Electronically Signed   By: Kristine Garbe M.D.   On: 12/17/2018  22:27   Dg Pelvis Portable  Result Date: 12/17/2018 CLINICAL DATA:  Fall. EXAM: PORTABLE PELVIS 1-2 VIEWS COMPARISON:  None. FINDINGS: The bones appear adequately mineralized. No evidence of acute fracture or dislocation. There are mild degenerative changes of the hips and sacroiliac joints. Iliofemoral atherosclerosis noted. IMPRESSION: No evidence of acute pelvic injury. Electronically Signed   By: Richardean Sale M.D.   On: 12/17/2018 17:22   Dg Chest Portable 1 View  Result Date: 12/17/2018 CLINICAL DATA:  Fall. EXAM: PORTABLE CHEST 1 VIEW COMPARISON:  Radiographs 01/12/2018.  CT 02/15/2018. FINDINGS: 1642 hours. Patient is mildly rotated to the right. The heart size and mediastinal contours are stable with aortic atherosclerosis. There is chronic fibrotic interstitial lung disease with mild superimposed atelectasis at the right lung base. No confluent airspace opacity, pleural effusion or pneumothorax identified. There is a small calcified right lung granuloma. No evidence of acute fracture. Telemetry leads overlie the chest. IMPRESSION: No acute posttraumatic findings in the chest. Mild right lower lobe atelectasis superimposed on chronic fibrotic interstitial lung disease. Electronically Signed   By: Richardean Sale M.D.   On: 12/17/2018 17:21    Pending Labs Unresulted Labs (From admission, onward)   None      Vitals/Pain Today's Vitals   12/17/18 2100 12/17/18 2130 12/17/18 2255 12/17/18 2321  BP: 136/76 (!) 146/91  (!) 139/99  Pulse:  (!) 59    Resp: 18 14     Temp:   98.4 F (36.9 C)   TempSrc:   Oral   SpO2:  (!) 88%      Isolation Precautions No active isolations  Medications Medications - No data to display  Mobility walks High fall risk   Focused Assessments Neuro Assessment Handoff:  Swallow screen pass? Yes    NIH Stroke Scale ( + Modified Stroke Scale Criteria)  Interval: Initial Level of Consciousness (1a.)   : Alert, keenly responsive LOC Questions (1b. )   +: Answers one question correctly LOC Commands (1c. )   + : Performs both tasks correctly Best Gaze (2. )  +: Partial gaze palsy Visual (3. )  +: No visual loss Facial Palsy (4. )    : Minor paralysis Motor Arm, Left (5a. )   +: No movement Motor Arm, Right (5b. )   +: No drift Motor Leg, Left (6a. )   +: No movement Motor Leg, Right (6b. )   +: No drift Limb Ataxia (7. ): Absent Sensory (8. )   +: Mild-to-moderate sensory loss, patient feels pinprick is less sharp or is dull on the affected side, or there is a loss of superficial pain with pinprick, but patient is aware of being touched Best Language (9. )   +: No aphasia Dysarthria (10. ): Normal Extinction/Inattention (11.)   +: Visual/tactile/auditory/spatial/personal inattention Modified SS Total  +: 12 Complete NIHSS TOTAL: 13     Neuro Assessment:   Neuro Checks:   Initial (12/17/18 1604)  Last Documented NIHSS Modified Score: 12 (12/17/18 1604) Has TPA been given? No If patient is a Neuro Trauma and patient is going to OR before floor call report to [facility] nurse: [phone] or [phone]     R Recommendations: See Admitting Provider Note  Report given to:   Additional Notes: subacute/acute CVA on MRI. Pt normally walks

## 2018-12-18 ENCOUNTER — Inpatient Hospital Stay (HOSPITAL_COMMUNITY): Payer: Medicare Other

## 2018-12-18 DIAGNOSIS — E876 Hypokalemia: Secondary | ICD-10-CM

## 2018-12-18 DIAGNOSIS — I6521 Occlusion and stenosis of right carotid artery: Secondary | ICD-10-CM

## 2018-12-18 DIAGNOSIS — I358 Other nonrheumatic aortic valve disorders: Secondary | ICD-10-CM

## 2018-12-18 DIAGNOSIS — I639 Cerebral infarction, unspecified: Secondary | ICD-10-CM

## 2018-12-18 LAB — LIPID PANEL
Cholesterol: 166 mg/dL (ref 0–200)
HDL: 48 mg/dL (ref >40)
LDL Cholesterol: 100 mg/dL — ABNORMAL HIGH (ref 0–99)
Total CHOL/HDL Ratio: 3.5 RATIO
Triglycerides: 92 mg/dL (ref <150)
VLDL: 18 mg/dL (ref 0–40)

## 2018-12-18 LAB — BASIC METABOLIC PANEL
Anion gap: 11 (ref 5–15)
BUN: 7 mg/dL — ABNORMAL LOW (ref 8–23)
CO2: 23 mmol/L (ref 22–32)
Calcium: 8.6 mg/dL — ABNORMAL LOW (ref 8.9–10.3)
Chloride: 100 mmol/L (ref 98–111)
Creatinine, Ser: 0.69 mg/dL (ref 0.44–1.00)
GFR calc Af Amer: >60 mL/min (ref >60)
GFR calc non Af Amer: >60 mL/min (ref >60)
Glucose, Bld: 229 mg/dL — ABNORMAL HIGH (ref 70–99)
Potassium: 3.8 mmol/L (ref 3.5–5.1)
Sodium: 134 mmol/L — ABNORMAL LOW (ref 135–145)

## 2018-12-18 LAB — GLUCOSE, CAPILLARY
Glucose-Capillary: 132 mg/dL — ABNORMAL HIGH (ref 70–99)
Glucose-Capillary: 132 mg/dL — ABNORMAL HIGH (ref 70–99)
Glucose-Capillary: 139 mg/dL — ABNORMAL HIGH (ref 70–99)
Glucose-Capillary: 150 mg/dL — ABNORMAL HIGH (ref 70–99)
Glucose-Capillary: 198 mg/dL — ABNORMAL HIGH (ref 70–99)

## 2018-12-18 LAB — MAGNESIUM: Magnesium: 1.9 mg/dL (ref 1.7–2.4)

## 2018-12-18 LAB — HEMOGLOBIN A1C
Hgb A1c MFr Bld: 12.3 % — ABNORMAL HIGH (ref 4.8–5.6)
Mean Plasma Glucose: 306.31 mg/dL

## 2018-12-18 LAB — ECHOCARDIOGRAM COMPLETE

## 2018-12-18 MED ORDER — ASPIRIN 325 MG PO TABS
325.0000 mg | ORAL_TABLET | Freq: Every day | ORAL | Status: DC
  Administered 2018-12-18 – 2018-12-21 (×4): 325 mg via ORAL
  Filled 2018-12-18 (×4): qty 1

## 2018-12-18 MED ORDER — CLOPIDOGREL BISULFATE 75 MG PO TABS
75.0000 mg | ORAL_TABLET | Freq: Every day | ORAL | Status: DC
  Administered 2018-12-18 – 2018-12-21 (×4): 75 mg via ORAL
  Filled 2018-12-18 (×4): qty 1

## 2018-12-18 MED ORDER — IOHEXOL 350 MG/ML SOLN
75.0000 mL | Freq: Once | INTRAVENOUS | Status: AC | PRN
  Administered 2018-12-18: 75 mL via INTRAVENOUS

## 2018-12-18 MED ORDER — ATORVASTATIN CALCIUM 80 MG PO TABS
80.0000 mg | ORAL_TABLET | Freq: Every day | ORAL | Status: DC
  Administered 2018-12-18 – 2018-12-20 (×3): 80 mg via ORAL
  Filled 2018-12-18 (×3): qty 1

## 2018-12-18 NOTE — Progress Notes (Signed)
  Echocardiogram 2D Echocardiogram has been performed.  C[facility] 12/18/2018, 12:39 PM

## 2018-12-18 NOTE — Consult Note (Signed)
REASON FOR CONSULT:    Right brain stroke with right carotid stenosis.  The consult is requested by Dr. Karleen Hampshire.   ASSESSMENT & PLAN:   RIGHT BRAIN STROKE: The patient has had a previous right brain stroke with residual profound weakness in the left upper extremity and left lower extremity.  However the patient was ambulatory with a walker.  She presents with a new stroke.  The patient has several small foci of acute and early subacute infarcts within the right ACA distribution.  The CT angiogram shows both extracranial and intracranial disease and neurology favors maximal medical therapy which I would agree with given her markedly debilitated state currently.  Plan is for dual antiplatelet therapy for 3 months given her intracranial and extracranial disease.  She is also on high-dose statins now.  I have ordered a carotid duplex scan to further assess the severity of her carotid disease as I think CT scan is not always reliable for this.  I will be happy to see her back in the office in approximately 3 months to reevaluate her.  If she does have a 70% right carotid stenosis and shows significant improvement clinically she could be considered for right carotid endarterectomy in the future.   Deitra Mayo, MD, FACS [facility] [phone] Office: [phone]   HPI:   Crystal Moore is a pleasant 73 y.o. female, who was admitted yesterday after a fall.  Of note, the patient is a poor historian and it was difficult to get a detailed history from her.  For example, she states that she had 2 previous strokes that were associated with right-sided weakness although according to the records her previous stroke was associated with left hemiparesis.  Therefore a significant portion of the history is obtained from her records.  The patient had a previous stroke according to the daughter which left her with left-sided weakness.  She had no strength in her left upper extremity since her previous stroke.   Although she had some weakness in her left leg she was able to ambulate with a walker.  On the morning of admission, at approximately 10 AM while trying to walk she fell because her left leg gave out and became completely limp.  According to the records the patient had been on a baby aspirin at but this had been stopped during her previous hospitalization in November 2019. On admission her COVID-19 rapid test was negative.  The patient's risk factors for peripheral vascular disease include diabetes and tobacco use.  She smokes 1 pack/day of cigarettes and has been smoking since she was 73 years old.  She denies any history of hypertension, hypercholesterolemia, or family history of premature cardiovascular disease.  I have also the reviewed the note of the neurohospitalist.  Given that the patient has both significant intracranial and extracranial disease it sounds like he favors maximal medical therapy initially.  The patient will need to be continued on aspirin with plans for dual antiplatelet therapy for 3 months given the intracranial and extracranial disease.  The patient will also be on high intensity statins.  Past Medical History:  Diagnosis Date  . Arthritis   . COPD (chronic obstructive pulmonary disease) ([place])   . CVA (cerebral vascular accident) ([place])   . Diabetes ([place])   . Diabetes mellitus without complication ([place])   . Hemiparesis affecting left side as late effect of cerebrovascular accident (CVA) ([place])   . Pyelonephritis   . Seizures ([place])   . Stroke ([place])   .  Tobacco use   . Tobacco use disorder   . UTI (urinary tract infection)     Family History  Problem Relation Age of Onset  . Diabetes Mother   . Diabetes Sister   . Diabetes Brother     SOCIAL HISTORY: Social History   Socioeconomic History  . Marital status: Single    Spouse name: Not on file  . Number of children: Not on file  . Years of education: Not on file  . Highest education level: Not on file   Occupational History  . Not on file  Social Needs  . Financial resource strain: Not on file  . Food insecurity:    Worry: Not on file    Inability: Not on file  . Transportation needs:    Medical: Not on file    Non-medical: Not on file  Tobacco Use  . Smoking status: Current Every Day Smoker    Packs/day: 1.00    Years: 40.00    Pack years: 40.00    Types: Cigarettes  . Smokeless tobacco: Never Used  Substance and Sexual Activity  . Alcohol use: Never    Frequency: Never  . Drug use: Never  . Sexual activity: Not on file  Lifestyle  . Physical activity:    Days per week: Not on file    Minutes per session: Not on file  . Stress: Not on file  Relationships  . Social connections:    Talks on phone: Not on file    Gets together: Not on file    Attends religious service: Not on file    Active member of club or organization: Not on file    Attends meetings of clubs or organizations: Not on file    Relationship status: Not on file  . Intimate partner violence:    Fear of current or ex partner: Not on file    Emotionally abused: Not on file    Physically abused: Not on file    Forced sexual activity: Not on file  Other Topics Concern  . Not on file  Social History Narrative   ** Merged History Encounter **        No Known Allergies  Current Facility-Administered Medications  Medication Dose Route Frequency Provider Last Rate Last Dose  . 0.9 %  sodium chloride infusion   Intravenous Continuous Shela Leff, MD 100 mL/hr at 12/18/18 0113    . acetaminophen (TYLENOL) tablet 650 mg  650 mg Oral Q4H PRN Shela Leff, MD       Or  . acetaminophen (TYLENOL) solution 650 mg  650 mg Per Tube Q4H PRN Shela Leff, MD       Or  . acetaminophen (TYLENOL) suppository 650 mg  650 mg Rectal Q4H PRN Shela Leff, MD      . enoxaparin (LOVENOX) injection 40 mg  40 mg Subcutaneous Daily Shela Leff, MD   40 mg at 12/18/18 0903  . insulin aspart  (novoLOG) injection 0-5 Units  0-5 Units Subcutaneous QHS Shela Leff, MD      . insulin aspart (novoLOG) injection 0-9 Units  0-9 Units Subcutaneous TID WC Shela Leff, MD   2 Units at 12/18/18 [phone]  . insulin glargine (LANTUS) injection 15 Units  15 Units Subcutaneous Daily Shela Leff, MD   15 Units at 12/18/18 0901  . ipratropium-albuterol (DUONEB) 0.5-2.5 (3) MG/3ML nebulizer solution 3 mL  3 mL Nebulization Q6H PRN Shela Leff, MD      . nicotine (NICODERM CQ -  dosed in mg/24 hours) patch 21 mg  21 mg Transdermal Daily Shela Leff, MD   21 mg at 12/18/18 0903  . senna-docusate (Senokot-S) tablet 1 tablet  1 tablet Oral QHS PRN Shela Leff, MD        REVIEW OF SYSTEMS:  [X]  denotes positive finding, [ ]  denotes negative finding Cardiac  Comments:  Chest pain or chest pressure:    Shortness of breath upon exertion: x   Short of breath when lying flat:    Irregular heart rhythm:        Vascular    Pain in calf, thigh, or hip brought on by ambulation:    Pain in feet at night that wakes you up from your sleep:     Blood clot in your veins:    Leg swelling:         Pulmonary    Oxygen at home:    Productive cough:     Wheezing:         Neurologic    Sudden weakness in arms or legs:  x   Sudden numbness in arms or legs:     Sudden onset of difficulty speaking or slurred speech:    Temporary loss of vision in one eye:     Problems with dizziness:         Gastrointestinal    Blood in stool:     Vomited blood:         Genitourinary    Burning when urinating:     Blood in urine:        Psychiatric    Major depression:         Hematologic    Bleeding problems:    Problems with blood clotting too easily:        Skin    Rashes or ulcers:        Constitutional    Fever or chills:     PHYSICAL EXAM:   Vitals:   12/18/18 0300 12/18/18 0407 12/18/18 0728 12/18/18 1139  BP: 100/74 (!) 146/100 (!) 150/68 (!) 177/135  Pulse: 78  78 72 79  Resp: 18 18 16 16   Temp: 97.7 F (36.5 C) 99.6 F (37.6 C) 98.2 F (36.8 C) 98.3 F (36.8 C)  TempSrc: Oral Oral Oral Oral  SpO2: 94% 100% 96% 96%   GENERAL: The patient is a well-nourished female, in no acute distress. The vital signs are documented above. CARDIAC: There is a regular rate and rhythm.  VASCULAR: She has soft bilateral carotid bruits. She has palpable femoral pulses. I cannot palpate pedal pulses however both feet are warm and adequately perfused. She has no significant lower extremity swelling. PULMONARY: There is good air exchange bilaterally without wheezing or rales. ABDOMEN: Soft and non-tender with normal pitched bowel sounds.  MUSCULOSKELETAL: There are no major deformities or cyanosis. NEUROLOGIC: She has 0 out of 5 strength in the left upper extremity and left lower extremity.  She appears to have reasonable strength on the right. SKIN: There are no ulcers or rashes noted. PSYCHIATRIC: The patient has a normal affect.  DATA:    LABS: Her GFR is greater than 60.  Creatinine 0.69.  LDL cholesterol is 100.  Hemoglobin is 13.5 white blood cell count 9.8.  Platelets 208,000.  CT HEAD: CT of the head on 12/17/2018 shows no acute intracranial abnormality.  In addition there was no evidence of fracture to the cervical spine.  CT ANGIOGRAM NECK: I reviewed the images of the  CT angiogram of the neck.  There is a right carotid stenosis at the bifurcation which is calcified.  This is interpreted by the radiologist as a 70% stenosis.  There is also a plaque at the left carotid bifurcation which is interpreted as a 60% stenosis.  The left vertebral artery is occluded.  The right vertebral artery is patent.  CT ANGIOGRAM HEAD: CT angiogram of the head shows no proximal large vessel occlusion aneurysm or AVM.  There is a moderate to severe right A2 stenosis.  There is intracranial atherosclerosis.  There are multiple segments of mild stenosis throughout the anterior  and posterior circulation.  There is a moderate paraclinoid stenosis bilaterally which is more significant on the right side.  MR BRAIN: I have reviewed the MRI of the brain that was done on 12/17/2018.  The patient has several small foci of acute and early subacute infarcts within the right ACA distribution.

## 2018-12-18 NOTE — Progress Notes (Signed)
Carotid artery duplex has been completed. Preliminary results can be found in CV Proc through chart review.   12/18/18 3:01 PM Crystal Moore RVT

## 2018-12-18 NOTE — Evaluation (Signed)
Speech Language Pathology Evaluation Patient Details Name: Crystal Moore MRN: [id number] DOB: August 22, 1945 Today's Date: 12/18/2018 Time: 9678-9381 SLP Time Calculation (min) (ACUTE ONLY): 30 min  Problem List:  Patient Active Problem List   Diagnosis Date Noted  . Hypokalemia 12/18/2018  . Acute CVA (cerebrovascular accident) ([place]) 12/17/2018  . Malnutrition of moderate degree 05/31/2018  . Hematochezia 05/29/2018  . Ischemic colitis ([place]) 05/29/2018  . Acute encephalopathy 05/29/2018  . Type 2 diabetes mellitus ([place]) 05/29/2018  . Hypertension 05/29/2018  . Depression 05/29/2018  . COPD (chronic obstructive pulmonary disease) ([place]) 05/29/2018  . Sepsis ([place]) 05/28/2018  . Diabetes ([place]) 03/16/2018  . ILD (interstitial lung disease) ([place]) 02/18/2018  . Chronic respiratory failure with hypoxia ([place]) 02/18/2018  . COPD (chronic obstructive pulmonary disease) ([place]) 01/12/2018  . Tobacco abuse 01/12/2018   Past Medical History:  Past Medical History:  Diagnosis Date  . Arthritis   . COPD (chronic obstructive pulmonary disease) ([place])   . CVA (cerebral vascular accident) ([place])   . Diabetes ([place])   . Diabetes mellitus without complication ([place])   . Hemiparesis affecting left side as late effect of cerebrovascular accident (CVA) ([place])   . Pyelonephritis   . Seizures ([place])   . Stroke ([place])   . Tobacco use   . Tobacco use disorder   . UTI (urinary tract infection)    Past Surgical History:  Past Surgical History:  Procedure Laterality Date  . TUBAL LIGATION     HPI:  Crystal Moore with a h/o CVA and residual left sided hemiparesis presented to ED after a fall. CT negative for acute abnormalities, however MRI found several small foci of acute/early subacute infarction within theright ACA distribution. No gross hemorrhage or mass effect. Daughter reported her speech and language were effected by her last stroke, but had full recovery after speech and language therapy. Pt  passed the [place] and is tolerating a regular diet with thin liquids.    Assessment / Plan / Recommendation Clinical Impression  Crystal Moore was seen for Speech/language and cognitition evaluation today. She appears to have a mild to moderate cognitive deficits. She is oriented to self, but not to place, date or situation. Even after orienting her to date and place, she was uanble provide correct answers at the end of the session. Subtest of the Cognistat were given to assess cognition. She has deficits in the area of memory, orientation, attention and comprehension.  Naming and repitition were her relative strengths. She also appears to have delayed processing. Crystal. Moore would benefit from Speech therapy to address her cognitive deficits to reduce caregiver burden. ST to follow 2X week while in Acute care. Spoke with pt's daughter, Crystal Moore and she hopes pt will be transfering to Inpatient Rehab for continued recovery and care.     SLP Assessment  SLP Recommendation/Assessment: Patient needs continued Speech Lanaguage Pathology Services SLP Visit Diagnosis: Cognitive communication deficit (R41.841)    Follow Up Recommendations  Inpatient Rehab    Frequency and Duration min 2x/week  2 weeks      SLP Evaluation Cognition  Overall Cognitive Status: Impaired/Different from baseline Arousal/Alertness: Awake/alert Orientation Level: Oriented to person;Disoriented to place;Disoriented to time;Disoriented to situation Attention: Focused Focused Attention: Appears intact Memory: Impaired Memory Impairment: Decreased recall of new information Awareness: Appears intact Problem Solving: Impaired Problem Solving Impairment: Verbal complex Executive Function: Reasoning Reasoning: Impaired Reasoning Impairment: Verbal complex Safety/Judgment: Impaired       Comprehension  Auditory Comprehension Overall Auditory  Comprehension: Impaired Yes/No Questions: Within Functional  Limits Commands: Impaired Two Step Basic Commands: 0-24% accurate Conversation: Simple Interfering Components: Processing speed EffectiveTechniques: Extra processing time;Repetition Visual Recognition/Discrimination Discrimination: Exceptions to [facility] Reading Comprehension Reading Status: Unable to assess (comment)    Expression Expression Primary Mode of Expression: Verbal Verbal Expression Overall Verbal Expression: Appears within functional limits for tasks assessed Initiation: No impairment Automatic Speech: Name;Social Response Level of Generative/Spontaneous Verbalization: Phrase Repetition: No impairment Naming: No impairment Pragmatics: No impairment Written Expression Written Expression: Unable to assess (comment)   Oral / Motor  Oral Motor/Sensory Function Overall Oral Motor/Sensory Function: Within functional limits Motor Speech Overall Motor Speech: Appears within functional limits for tasks assessed Respiration: Within functional limits Phonation: Normal Resonance: Within functional limits Articulation: Within functional limitis Intelligibility: Intelligible Motor Planning: Witnin functional limits Motor Speech Errors: Not applicable   GO          Crystal Moore Crystal Diekman, MA, CCC-SLP 12/18/2018 2:11 PM

## 2018-12-18 NOTE — Progress Notes (Signed)
PROGRESS NOTE    Crystal Moore  ERX:[id number] DOB: [birth year] DOA: 12/17/2018 PCP: [name], MD  Brief Narrative:  Crystal Moore is a 73 y.o. female with medical history significant of arthritis, COPD, CVA with residual left hemiparesis, type 2 diabetes, seizures not on antiepileptics presenting to the hospital for evaluation after a fall. COVID-19 rapid test negative.  UA not suggestive of infection.  Chest x-ray without evidence of pneumonia or acute posttraumatic finding.  Pelvic x-ray negative for fracture.  CT head negative for acute finding.  CT C-spine negative for fracture or acute subluxation.  MRI brain showing several small foci of acute/early subacute infarction within the right ACA distribution.   Assessment & Plan:   Principal Problem:   Acute CVA (cerebrovascular accident) ([place]) Active Problems:   COPD (chronic obstructive pulmonary disease) (HCC)   Tobacco abuse   Type 2 diabetes mellitus (HCC)   Hypokalemia    Right  ACA infarction: Started on aspirin 325 mg , plavix added today. Neurology consulted and recommendations. Given.  LDL is 100, INCREASED the zocor 40 mg to lipitor 80 mg daily.  Vascular surgery consulted for bil carotid stenosis.  Pending PT, OT. SLP evaluation.    COPD:  Stable.    Type 2 DM: Uncontrolled with hyperglycemia CBG (last 3)  Recent Labs    12/18/18 0647 12/18/18 1344 12/18/18 1619  GLUCAP 198* 139* 132*   Resume long acting and SSI.    Hypertension:  Better controlled.     DVT prophylaxis: lovenox.  Code Status: full code.  Family Communication: none at bedside, will call family to update.  Disposition Plan: pending clinical improvement. And CIR eval.    Consultants:   Neurology  Vascular surgery.    Procedures: CAROTID DUPLEX.   CTA of the head and neck.   Echocardiogram.   Antimicrobials:none.   Subjective: No chest pain or sob.   Objective: Vitals:   12/18/18 0407 12/18/18 0728  12/18/18 1139 12/18/18 1610  BP: (!) 146/100 (!) 150/68 (!) 177/135 (!) 155/71  Pulse: 78 72 79 77  Resp: 18 16 16 16   Temp: 99.6 F (37.6 C) 98.2 F (36.8 C) 98.3 F (36.8 C) 97.8 F (36.6 C)  TempSrc: Oral Oral Oral Oral  SpO2: 100% 96% 96% 97%    Intake/Output Summary (Last 24 hours) at 12/18/2018 1741 Last data filed at 12/18/2018 1500 Gross per 24 hour  Intake 0.24 ml  Output --  Net 0.24 ml   There were no vitals filed for this visit.  Examination:  General exam: Appears calm and comfortable  Respiratory system: Clear to auscultation. Respiratory effort normal. Cardiovascular system: S1 & S2 heard, RRR. Gastrointestinal system: Abdomen is nondistended, soft and nontender. No organomegaly or masses felt. Normal bowel sounds heard. Central nervous system: Alert and oriented. Extremities: Symmetric 5 x 5 power. Skin: No rashes, lesions or ulcers Psychiatry:  Mood & affect appropriate.     Data Reviewed: I have personally reviewed following labs and imaging studies  CBC: Recent Labs  Lab 12/17/18 1735  WBC 9.8  NEUTROABS 6.7  HGB 13.5  HCT 39.9  MCV 80.6  PLT 950   Basic Metabolic Panel: Recent Labs  Lab 12/17/18 1735 12/18/18 0355  NA 134* 134*  K 3.4* 3.8  CL 97* 100  CO2 24 23  GLUCOSE 173* 229*  BUN 9 7*  CREATININE 0.64 0.69  CALCIUM 8.9 8.6*  MG 1.9  --    GFR: CrCl cannot be calculated (Unknown  ideal weight.). Liver Function Tests: Recent Labs  Lab 12/17/18 1735  AST 19  ALT 16  ALKPHOS 86  BILITOT 0.5  PROT 6.7  ALBUMIN 3.3*   No results for input(s): LIPASE, AMYLASE in the last 168 hours. No results for input(s): AMMONIA in the last 168 hours. Coagulation Profile: No results for input(s): INR, PROTIME in the last 168 hours. Cardiac Enzymes: Recent Labs  Lab 12/17/18 1735  CKTOTAL 88   BNP (last 3 results) No results for input(s): PROBNP in the last 8760 hours. HbA1C: Recent Labs    12/18/18 0355  HGBA1C 12.3*    CBG: Recent Labs  Lab 12/17/18 1605 12/18/18 0048 12/18/18 0647 12/18/18 1344 12/18/18 1619  GLUCAP 187* 132* 198* 139* 132*   Lipid Profile: Recent Labs    12/18/18 0355  CHOL 166  HDL 48  LDLCALC 100*  TRIG 92  CHOLHDL 3.5   Thyroid Function Tests: No results for input(s): TSH, T4TOTAL, FREET4, T3FREE, THYROIDAB in the last 72 hours. Anemia Panel: No results for input(s): VITAMINB12, FOLATE, FERRITIN, TIBC, IRON, RETICCTPCT in the last 72 hours. Sepsis Labs: No results for input(s): PROCALCITON, LATICACIDVEN in the last 168 hours.  Recent Results (from the past 240 hour(s))  SARS Coronavirus 2 (CEPHEID - Performed in [facility] hospital lab), Hosp Order     Status: None   Collection Time: 12/17/18  5:07 PM  Result Value Ref Range Status   SARS Coronavirus 2 NEGATIVE NEGATIVE Final    Comment: (NOTE) If result is NEGATIVE SARS-CoV-2 target nucleic acids are NOT DETECTED. The SARS-CoV-2 RNA is generally detectable in upper and lower  respiratory specimens during the acute phase of infection. The lowest  concentration of SARS-CoV-2 viral copies this assay can detect is 250  copies / mL. A negative result does not preclude SARS-CoV-2 infection  and should not be used as the sole basis for treatment or other  patient management decisions.  A negative result may occur with  improper specimen collection / handling, submission of specimen other  than nasopharyngeal swab, presence of viral mutation(s) within the  areas targeted by this assay, and inadequate number of viral copies  (<250 copies / mL). A negative result must be combined with clinical  observations, patient history, and epidemiological information. If result is POSITIVE SARS-CoV-2 target nucleic acids are DETECTED. The SARS-CoV-2 RNA is generally detectable in upper and lower  respiratory specimens dur ing the acute phase of infection.  Positive  results are indicative of active infection with  SARS-CoV-2.  Clinical  correlation with patient history and other diagnostic information is  necessary to determine patient infection status.  Positive results do  not rule out bacterial infection or co-infection with other viruses. If result is PRESUMPTIVE POSTIVE SARS-CoV-2 nucleic acids MAY BE PRESENT.   A presumptive positive result was obtained on the submitted specimen  and confirmed on repeat testing.  While 2019 novel coronavirus  (SARS-CoV-2) nucleic acids may be present in the submitted sample  additional confirmatory testing may be necessary for epidemiological  and / or clinical management purposes  to differentiate between  SARS-CoV-2 and other Sarbecovirus currently known to infect humans.  If clinically indicated additional testing with an alternate test  methodology [phone]) is advised. The SARS-CoV-2 RNA is generally  detectable in upper and lower respiratory sp ecimens during the acute  phase of infection. The expected result is Negative. Fact Sheet for Patients:  StrictlyIdeas.no Fact Sheet for Healthcare Providers: BankingDealers.co.za This test is not yet  approved or cleared by the [place] and has been authorized for detection and/or diagnosis of SARS-CoV-2 by FDA under an Emergency Use Authorization (EUA).  This EUA will remain in effect (meaning this test can be used) for the duration of the COVID-19 declaration under Section 564(b)(1) of the Act, 21 U.S.C. section 360bbb-3(b)(1), unless the authorization is terminated or revoked sooner. Performed at [facility], [place] [address], [place], [place] [address]          Radiology Studies: Ct Angio Head W Or Wo Contrast  Result Date: 12/18/2018 CLINICAL DATA:  73 y/o  F; stroke for follow-up. EXAM: CT ANGIOGRAPHY HEAD AND NECK TECHNIQUE: Multidetector CT imaging of the head and neck was performed using the standard protocol during bolus  administration of intravenous contrast. Multiplanar CT image reconstructions and MIPs were obtained to evaluate the vascular anatomy. Carotid stenosis measurements (when applicable) are obtained utilizing NASCET criteria, using the distal internal carotid diameter as the denominator. CONTRAST:  35mL OMNIPAQUE IOHEXOL 350 MG/ML SOLN COMPARISON:  12/17/2018 MRI of the head. FINDINGS: CTA NECK FINDINGS Aortic arch: Bovine variant branching. Imaged portion shows no evidence of aneurysm or dissection. No significant stenosis of the major arch vessel origins. Atherosclerosis of the aortic arch. Right carotid system: Dense calcified plaque of the right carotid bifurcation with severe 70% proximal ICA stenosis. Severe stenosis origin of right ECA. Left carotid system: Mixed plaque of the left carotid bifurcation with moderate 60% proximal ICA stenosis. Severe stenosis of the origin of right ECA. Vertebral arteries: Left V1 and V2 are occluded to the C4-5 level. The vessel cranial to the C4-5 level is irregular and attenuated. The right dominant vertebral artery is widely patent without high-grade stenosis, dissection, or aneurysm. Skeleton: No acute finding. Cervical spondylosis with predominant discogenic degenerative changes, greatest at the C4-C6 levels. At C4-C6 there are endplate marginal osteophytes and disc protrusions resulting in approximately moderate spinal canal stenosis, possible underlying cord impingement. Other neck: 14 mm nodule in the right lobe of the thyroid gland. Upper chest: Fibrosis and emphysema in the lung apices. Review of the MIP images confirms the above findings CTA HEAD FINDINGS Anterior circulation: Calcified plaque of the right carotid siphon with moderate paraclinoid stenosis. Calcified plaque of the left horizontal petrous segment of ICA with mild stenosis and moderate left paraclinoid ICA stenosis. Moderate to severe stenosis of the right A2 with attenuation of the downstream right ACA  (series 11, image 16). Mild right distal M1 stenosis. Otherwise no proximal occlusion, aneurysm, or vascular malformation. Posterior circulation: Tandem segments of mild-to-moderate stenosis of the left intracranial vertebral artery. Widely patent right vertebral artery. Mild right P2 stenosis. Moderate left P1 and severe left P2 stenosis. Otherwise no proximal occlusion, aneurysm, or vascular malformation. Venous sinuses: As permitted by contrast timing, patent. Anatomic variants: None significant. Delayed phase: No abnormal intracranial enhancement. Review of the MIP images confirms the above findings IMPRESSION: CTA neck bone 1. Right proximal ICA severe 70% stenosis stenosis secondary to calcified plaque. 2. Left proximal ICA moderate 60% stenosis secondary to mixed plaque. 3. Occluded left vertebral artery from origin to C4-5 level. Patent vessel above the C4-5 level, probably retrograde flow. 4. Right vertebral artery widely patent without high-grade stenosis, dissection, or aneurysm. 5. Cervical spondylosis greatest at C4-C6 levels with there is approximately moderate spinal canal stenosis, prominent disc displacements, and possible cord impingement. CTA HEAD:. 1. Patent anterior and posterior intracranial circulation. No proximal large vessel occlusion, aneurysm, or vascular malformation identified.  2. Moderate to severe right A2 stenosis and attenuated downstream right ACA. 3. Intracranial atherosclerosis with right greater than left ICA moderate paraclinoid stenosis, moderate left P1 and severe left P2 stenosis, as well as multiple segments of mild stenosis throughout the anterior and posterior circulations. Electronically Signed   By: Kristine Garbe M.D.   On: 12/18/2018 04:08   Ct Head Wo Contrast  Result Date: 12/17/2018 CLINICAL DATA:  Found down after possible fall this morning. Previous strokes. EXAM: CT HEAD WITHOUT CONTRAST CT CERVICAL SPINE WITHOUT CONTRAST TECHNIQUE: Multidetector  CT imaging of the head and cervical spine was performed following the standard protocol without intravenous contrast. Multiplanar CT image reconstructions of the cervical spine were also generated. COMPARISON:  None. FINDINGS: CT HEAD FINDINGS Brain: Moderate ventriculomegaly related to generalized parenchymal volume loss. Chronic small vessel ischemic changes are seen throughout the bilateral periventricular and subcortical white matter regions. Chronic lacunar infarcts are present within the bilateral basal ganglia regions, RIGHT greater than LEFT. Small old infarct also noted within the RIGHT cerebellum. There is no mass, hemorrhage, mass effect or other signs of acute parenchymal abnormality. Vascular: Chronic calcified atherosclerotic changes of the large vessels at the skull base. No unexpected hyperdense vessel. Skull: Normal. Negative for fracture or focal lesion. Sinuses/Orbits: No acute finding. Other: None. CT CERVICAL SPINE FINDINGS Alignment: No evidence of acute vertebral body subluxation. Skull base and vertebrae: No fracture line or displaced fracture fragment appreciated. Facet joints appear normally aligned throughout. Soft tissues and spinal canal: No prevertebral fluid or swelling. No visible canal hematoma. Disc levels: Degenerative changes of the mid and lower cervical spine, mild to moderate in degree with associated disc space narrowings and osseous spurring. Associated disc-osteophytic bulge at the C5-6 level causing moderate central canal stenosis with possible mass effect on the anterior portion of the cervical cord. No more than mild central canal stenosis at the remainder of the cervical levels. Upper chest: No acute findings. Other: Bilateral carotid atherosclerosis. IMPRESSION: 1. No acute intracranial abnormality. No intracranial mass, hemorrhage or edema. No skull fracture. Atrophy and chronic ischemic changes, as detailed above. 2. No fracture or acute subluxation within the  cervical spine. Degenerative changes of the cervical spine, mild to moderate in degree, as detailed above. 3. Carotid atherosclerosis. Electronically Signed   By: Franki Cabot M.D.   On: 12/17/2018 18:08   Ct Angio Neck W Or Wo Contrast  Result Date: 12/18/2018 CLINICAL DATA:  73 y/o  F; stroke for follow-up. EXAM: CT ANGIOGRAPHY HEAD AND NECK TECHNIQUE: Multidetector CT imaging of the head and neck was performed using the standard protocol during bolus administration of intravenous contrast. Multiplanar CT image reconstructions and MIPs were obtained to evaluate the vascular anatomy. Carotid stenosis measurements (when applicable) are obtained utilizing NASCET criteria, using the distal internal carotid diameter as the denominator. CONTRAST:  74mL OMNIPAQUE IOHEXOL 350 MG/ML SOLN COMPARISON:  12/17/2018 MRI of the head. FINDINGS: CTA NECK FINDINGS Aortic arch: Bovine variant branching. Imaged portion shows no evidence of aneurysm or dissection. No significant stenosis of the major arch vessel origins. Atherosclerosis of the aortic arch. Right carotid system: Dense calcified plaque of the right carotid bifurcation with severe 70% proximal ICA stenosis. Severe stenosis origin of right ECA. Left carotid system: Mixed plaque of the left carotid bifurcation with moderate 60% proximal ICA stenosis. Severe stenosis of the origin of right ECA. Vertebral arteries: Left V1 and V2 are occluded to the C4-5 level. The vessel cranial to the C4-5 level is  irregular and attenuated. The right dominant vertebral artery is widely patent without high-grade stenosis, dissection, or aneurysm. Skeleton: No acute finding. Cervical spondylosis with predominant discogenic degenerative changes, greatest at the C4-C6 levels. At C4-C6 there are endplate marginal osteophytes and disc protrusions resulting in approximately moderate spinal canal stenosis, possible underlying cord impingement. Other neck: 14 mm nodule in the right lobe of the  thyroid gland. Upper chest: Fibrosis and emphysema in the lung apices. Review of the MIP images confirms the above findings CTA HEAD FINDINGS Anterior circulation: Calcified plaque of the right carotid siphon with moderate paraclinoid stenosis. Calcified plaque of the left horizontal petrous segment of ICA with mild stenosis and moderate left paraclinoid ICA stenosis. Moderate to severe stenosis of the right A2 with attenuation of the downstream right ACA (series 11, image 16). Mild right distal M1 stenosis. Otherwise no proximal occlusion, aneurysm, or vascular malformation. Posterior circulation: Tandem segments of mild-to-moderate stenosis of the left intracranial vertebral artery. Widely patent right vertebral artery. Mild right P2 stenosis. Moderate left P1 and severe left P2 stenosis. Otherwise no proximal occlusion, aneurysm, or vascular malformation. Venous sinuses: As permitted by contrast timing, patent. Anatomic variants: None significant. Delayed phase: No abnormal intracranial enhancement. Review of the MIP images confirms the above findings IMPRESSION: CTA neck bone 1. Right proximal ICA severe 70% stenosis stenosis secondary to calcified plaque. 2. Left proximal ICA moderate 60% stenosis secondary to mixed plaque. 3. Occluded left vertebral artery from origin to C4-5 level. Patent vessel above the C4-5 level, probably retrograde flow. 4. Right vertebral artery widely patent without high-grade stenosis, dissection, or aneurysm. 5. Cervical spondylosis greatest at C4-C6 levels with there is approximately moderate spinal canal stenosis, prominent disc displacements, and possible cord impingement. CTA HEAD:. 1. Patent anterior and posterior intracranial circulation. No proximal large vessel occlusion, aneurysm, or vascular malformation identified. 2. Moderate to severe right A2 stenosis and attenuated downstream right ACA. 3. Intracranial atherosclerosis with right greater than left ICA moderate  paraclinoid stenosis, moderate left P1 and severe left P2 stenosis, as well as multiple segments of mild stenosis throughout the anterior and posterior circulations. Electronically Signed   By: Kristine Garbe M.D.   On: 12/18/2018 04:08   Ct Cervical Spine Wo Contrast  Result Date: 12/17/2018 CLINICAL DATA:  Found down after possible fall this morning. Previous strokes. EXAM: CT HEAD WITHOUT CONTRAST CT CERVICAL SPINE WITHOUT CONTRAST TECHNIQUE: Multidetector CT imaging of the head and cervical spine was performed following the standard protocol without intravenous contrast. Multiplanar CT image reconstructions of the cervical spine were also generated. COMPARISON:  None. FINDINGS: CT HEAD FINDINGS Brain: Moderate ventriculomegaly related to generalized parenchymal volume loss. Chronic small vessel ischemic changes are seen throughout the bilateral periventricular and subcortical white matter regions. Chronic lacunar infarcts are present within the bilateral basal ganglia regions, RIGHT greater than LEFT. Small old infarct also noted within the RIGHT cerebellum. There is no mass, hemorrhage, mass effect or other signs of acute parenchymal abnormality. Vascular: Chronic calcified atherosclerotic changes of the large vessels at the skull base. No unexpected hyperdense vessel. Skull: Normal. Negative for fracture or focal lesion. Sinuses/Orbits: No acute finding. Other: None. CT CERVICAL SPINE FINDINGS Alignment: No evidence of acute vertebral body subluxation. Skull base and vertebrae: No fracture line or displaced fracture fragment appreciated. Facet joints appear normally aligned throughout. Soft tissues and spinal canal: No prevertebral fluid or swelling. No visible canal hematoma. Disc levels: Degenerative changes of the mid and lower cervical spine, mild to moderate  in degree with associated disc space narrowings and osseous spurring. Associated disc-osteophytic bulge at the C5-6 level causing  moderate central canal stenosis with possible mass effect on the anterior portion of the cervical cord. No more than mild central canal stenosis at the remainder of the cervical levels. Upper chest: No acute findings. Other: Bilateral carotid atherosclerosis. IMPRESSION: 1. No acute intracranial abnormality. No intracranial mass, hemorrhage or edema. No skull fracture. Atrophy and chronic ischemic changes, as detailed above. 2. No fracture or acute subluxation within the cervical spine. Degenerative changes of the cervical spine, mild to moderate in degree, as detailed above. 3. Carotid atherosclerosis. Electronically Signed   By: Franki Cabot M.D.   On: 12/17/2018 18:08   Mr Brain Wo Contrast  Result Date: 12/17/2018 CLINICAL DATA:  73 y/o F; found down after possible fall. Focal neuro deficit, > 6 hrs, stroke suspected. EXAM: MRI HEAD WITHOUT CONTRAST TECHNIQUE: Axial and coronal DWI sequences were acquired. Patient was unable to continue the examination and additional sequences were not acquired. COMPARISON:  12/17/2018 CT of the head. FINDINGS: There are several small foci of reduced diffusion within the right caudate head and the right ACA distribution of medial frontal lobe compatible with acute/early subacute infarction. No associated gross hemorrhage or mass effect. Very small chronic infarction within the right cerebellum. Confluent white matter hyperintensities are compatible with advanced chronic microvascular ischemic changes and there is diffuse volume loss of the brain. No herniation. IMPRESSION: 1. DWI sequences were acquired.  Limited study. 2. Several small foci of acute/early subacute infarction within the right ACA distribution. No gross hemorrhage or mass effect. These results were called by telephone at the time of interpretation on 12/17/2018 at 10:25 pm to Dr. Trinidad Curet , who verbally acknowledged these results. Electronically Signed   By: Kristine Garbe M.D.   On: 12/17/2018  22:27   Dg Pelvis Portable  Result Date: 12/17/2018 CLINICAL DATA:  Fall. EXAM: PORTABLE PELVIS 1-2 VIEWS COMPARISON:  None. FINDINGS: The bones appear adequately mineralized. No evidence of acute fracture or dislocation. There are mild degenerative changes of the hips and sacroiliac joints. Iliofemoral atherosclerosis noted. IMPRESSION: No evidence of acute pelvic injury. Electronically Signed   By: Richardean Sale M.D.   On: 12/17/2018 17:22   Dg Chest Portable 1 View  Result Date: 12/17/2018 CLINICAL DATA:  Fall. EXAM: PORTABLE CHEST 1 VIEW COMPARISON:  Radiographs 01/12/2018.  CT 02/15/2018. FINDINGS: 1642 hours. Patient is mildly rotated to the right. The heart size and mediastinal contours are stable with aortic atherosclerosis. There is chronic fibrotic interstitial lung disease with mild superimposed atelectasis at the right lung base. No confluent airspace opacity, pleural effusion or pneumothorax identified. There is a small calcified right lung granuloma. No evidence of acute fracture. Telemetry leads overlie the chest. IMPRESSION: No acute posttraumatic findings in the chest. Mild right lower lobe atelectasis superimposed on chronic fibrotic interstitial lung disease. Electronically Signed   By: Richardean Sale M.D.   On: 12/17/2018 17:21   Vas US Carotid  Result Date: 12/18/2018 Carotid Arterial Duplex Study Indications:       CVA. Risk Factors:      Hypertension, hyperlipidemia, Diabetes, prior CVA. Other Factors:     COPD. Limitations:       Patient movement, respiratory disturbance, patient                    positioning Comparison Study:  12/18/18 CTA neck - R 70% ICA stenosis L 60% ICA stenosis  and                    occluded L vertebral artery Performing Technologist: Oliver Hum RVT  Examination Guidelines: A complete evaluation includes B-mode imaging, spectral Doppler, color Doppler, and power Doppler as needed of all accessible portions of each vessel. Bilateral testing is  considered an integral part of a complete examination. Limited examinations for reoccurring indications may be performed as noted.  Right Carotid Findings: +----------+--------+-------+--------+--------------------------------+--------+             PSV cm/s EDV     Stenosis Describe                         Comments                       cm/s                                                        +----------+--------+-------+--------+--------------------------------+--------+  CCA Prox   51       7                smooth and heterogenous                    +----------+--------+-------+--------+--------------------------------+--------+  CCA Distal 40       7                smooth and heterogenous                    +----------+--------+-------+--------+--------------------------------+--------+  ICA Prox   99       25               smooth, heterogenous and                                                         calcific                                   +----------+--------+-------+--------+--------------------------------+--------+  ICA Mid    98       22               smooth and heterogenous                    +----------+--------+-------+--------+--------------------------------+--------+  ICA Distal 82       22                                                          +----------+--------+-------+--------+--------------------------------+--------+  ECA        404      33               smooth, heterogenous and  calcific                                   +----------+--------+-------+--------+--------------------------------+--------+ +----------+--------+-------+--------+-------------------+             PSV cm/s EDV cms Describe Arm Pressure (mmHG)  +----------+--------+-------+--------+-------------------+  Subclavian 122                                            +----------+--------+-------+--------+-------------------+  +---------+--------+--+--------+--+---------+  Vertebral PSV cm/s 95 EDV cm/s 20 Antegrade  +---------+--------+--+--------+--+---------+  Left Carotid Findings: +----------+--------+-------+--------+--------------------------------+--------+             PSV cm/s EDV     Stenosis Describe                         Comments                       cm/s                                                        +----------+--------+-------+--------+--------------------------------+--------+  CCA Prox   50       8                smooth and heterogenous                    +----------+--------+-------+--------+--------------------------------+--------+  CCA Distal 46       6                smooth and heterogenous                    +----------+--------+-------+--------+--------------------------------+--------+  ICA Prox   126      27               smooth, heterogenous and                                                         calcific                                   +----------+--------+-------+--------+--------------------------------+--------+  ICA Mid    110      12               smooth and heterogenous                    +----------+--------+-------+--------+--------------------------------+--------+  ICA Distal 56       14                                                tortuous  +----------+--------+-------+--------+--------------------------------+--------+  ECA        212      0  smooth, heterogenous and                                                         calcific                                   +----------+--------+-------+--------+--------------------------------+--------+ +----------+--------+--------+--------------+-------------------+  Subclavian PSV cm/s EDV cm/s Describe       Arm Pressure (mmHG)  +----------+--------+--------+--------------+-------------------+                               Antegrade flow                      +----------+--------+--------+--------------+-------------------+  +---------+--------+--------+------+  Vertebral PSV cm/s EDV cm/s Absent  +---------+--------+--------+------+  Summary: Right Carotid: Velocities in the right ICA are consistent with a 1-39% stenosis.                Unable to match values found in the CTA neck performed on                12/18/18, likely due to poor visualization of calcified arteries. Left Carotid: Velocities in the left ICA are consistent with a 1-39% stenosis.               Unable to match values found in the CTA neck performed on 12/18/18,               likely due to poor visualization of calcified arteries. Vertebrals:  Right vertebral artery demonstrates retrograde flow. Left vertebral              artery demonstrates no discernable flow. Left vertebral artery              demonstrates an occlusion. Subclavians: Normal flow hemodynamics were seen in the right subclavian artery.              Left antegrade flow. *See table(s) above for measurements and observations.     Preliminary         Scheduled Meds:  aspirin  325 mg Oral Daily   clopidogrel  75 mg Oral Daily   enoxaparin (LOVENOX) injection  40 mg Subcutaneous Daily   insulin aspart  0-5 Units Subcutaneous QHS   insulin aspart  0-9 Units Subcutaneous TID WC   insulin glargine  15 Units Subcutaneous Daily   nicotine  21 mg Transdermal Daily   Continuous Infusions:  sodium chloride 100 mL/hr at 12/18/18 0113     LOS: 1 day    Time spent: 36 minutes    Hosie Poisson, MD [facility] Pager [phone]  If 7PM-7AM, please contact night-coverage [email or web address] Password [facility] 12/18/2018, 5:41 PM

## 2018-12-18 NOTE — Evaluation (Signed)
Occupational Therapy Evaluation Patient Details Name: Crystal Moore MRN: [id number] DOB: December 31, 1945 Today's Date: 12/18/2018    History of Present Illness Crystal Moore is a 73 y.o. female with medical history significant of arthritis, COPD, CVA with residual left hemiparesis, type 2 diabetes, seizures not on antiepileptics presenting to the hospital for evaluation after a fall. MRI revealed several small foci of acute/early subactue infarction with in the R ACA distribution.   Clinical Impression   Pt PTA: cared for by RN 8a-6p and family stays overnight. Pt had some L sided weakness residually, but pt's famiyl reports that she was able to ambulate with SPC in home. Pt currently limited by decreased mobility, L sided weakness, and decreased activity tolerance. Pt performing ADL with mod to [place] for UB/LB. Pt using RUE for all functional tasks. ModA for power up and maxA +1-2 for stand pivot transfer. No active movement noted on LUE. Pt would benefit from continued OT skilled services for ADL, mobility and safety in CIR setting. OT to follow acutely.    Follow Up Recommendations  CIR    Equipment Recommendations  None recommended by OT    Recommendations for Other Services Rehab consult     Precautions / Restrictions Precautions Precautions: Fall Restrictions Weight Bearing Restrictions: No      Mobility Bed Mobility Overal bed mobility: Needs Assistance Bed Mobility: Supine to Sit     Supine to sit: Mod assist;HOB elevated     General bed mobility comments: pt used L UE to pull up on PT, modA to pivot to EOB  Transfers Overall transfer level: Needs assistance Equipment used: (1 person face to face lift with gait belt) Transfers: Sit to/from [employer] Transfers Sit to Stand: Modified independent (Device/Increase time);Mod assist;Max assist Stand pivot transfers: Mod assist;Max assist       General transfer comment: modA to power up, L knee blocked, max  verbal directional cues for sequencing std pvt transfer to the chair, pt unable to toelrate full WBing on L LE without blocking it, dependent for advancement of L LE    Balance Overall balance assessment: Needs assistance Sitting-balance support: Feet supported;No upper extremity supported Sitting balance-Leahy Scale: Fair     Standing balance support: Bilateral upper extremity supported;During functional activity Standing balance-Leahy Scale: Zero Standing balance comment: dependent on physical assist                           ADL either performed or assessed with clinical judgement   ADL Overall ADL's : Needs assistance/impaired Eating/Feeding: Minimal assistance;Sitting   Grooming: Minimal assistance;Sitting   Upper Body Bathing: Moderate assistance;Sitting   Lower Body Bathing: Maximal assistance;Sitting/lateral leans;Sit to/from stand   Upper Body Dressing : Moderate assistance;Sitting   Lower Body Dressing: Maximal assistance;Sitting/lateral leans;Sit to/from stand   Toilet Transfer: Maximal assistance;Stand-pivot;BSC   Toileting- Clothing Manipulation and Hygiene: Maximal assistance;Sitting/lateral lean       Functional mobility during ADLs: Maximal assistance;+2 for physical assistance(+2 for more than transfer or for toilet hygiene) General ADL Comments: Pt sitting on BSC able to wipe perineal area. Pt's L side nearly flaccid and not functional L hand use. Pt requiring increased assist for ADL.     Vision Baseline Vision/History: No visual deficits Vision Assessment?: No apparent visual deficits     Perception     Praxis      Pertinent Vitals/Pain Pain Assessment: No/denies pain     Hand Dominance  Extremity/Trunk Assessment Upper Extremity Assessment Upper Extremity Assessment: LUE deficits/detail LUE Deficits / Details: hemiparesis, no active movement or functionally LUE Sensation: decreased light touch   Lower Extremity  Assessment Lower Extremity Assessment: Defer to PT evaluation LLE Deficits / Details: minimal voluntary movment, buckling in standing LLE Sensation: decreased light touch   Cervical / Trunk Assessment Cervical / Trunk Assessment: Kyphotic   Communication Communication Communication: Expressive difficulties(sometimes garbled speech)   Cognition Arousal/Alertness: Lethargic(easily awoken but difficulty to maintain) Behavior During Therapy: Flat affect Overall Cognitive Status: Impaired/Different from baseline                                     General Comments  VSS    Exercises     Shoulder Instructions      Home Living Family/patient expects to be discharged to:: Private residence Living Arrangements: Children Available Help at Discharge: Family;Available 24 hours/day;Personal care attendant(RN 8-2pm and then 2-6pm) Type of Home: House Home Access: Stairs to enter [employer] of Steps: 1 Entrance Stairs-Rails: Can reach both Home Layout: One level     Bathroom Shower/Tub: Teacher, early years/pre: Standard     Home Equipment: Cane - single point;Shower seat   Additional Comments: RN does Conservation officer, nature,       Prior Functioning/Environment Level of Independence: Needs assistance  Gait / Transfers Assistance Needed: was walking with a cane, uses a w/c for long distance ADL's / Homemaking Assistance Needed: baths self moslty except back side            OT Problem List: Decreased strength;Decreased activity tolerance;Impaired balance (sitting and/or standing);Decreased coordination;Decreased safety awareness;Decreased cognition;Impaired UE functional use      OT Treatment/Interventions: Self-care/ADL training;Therapeutic exercise;Neuromuscular education;Energy conservation;Therapeutic activities;Patient/family education;Balance training    OT Goals(Current goals can be found in the care plan section) Acute Rehab OT  Goals Patient Stated Goal: didn't state OT Goal Formulation: With patient Time For Goal Achievement: 01/01/19 Potential to Achieve Goals: Good ADL Goals Pt Will Perform Grooming: with set-up;sitting Pt Will Transfer to Toilet: with min assist;bedside commode Pt Will Perform Toileting - Clothing Manipulation and hygiene: with min assist;sit to/from stand Pt/caregiver will Perform Home Exercise Program: With written HEP provided;With minimal assist;Left upper extremity Additional ADL Goal #1: Pt will sit EOB x5 mins with fair balance and ability to use LUE for ADL tasks with  minA.  OT Frequency: Min 3X/week   Barriers to D/C:            Co-evaluation              AM-PAC OT "6 Clicks" Daily Activity     Outcome Measure Help from another person eating meals?: A Little Help from another person taking care of personal grooming?: A Lot Help from another person toileting, which includes using toliet, bedpan, or urinal?: Total Help from another person bathing (including washing, rinsing, drying)?: Total Help from another person to put on and taking off regular upper body clothing?: A Lot Help from another person to put on and taking off regular lower body clothing?: Total 6 Click Score: 10   End of Session Equipment Utilized During Treatment: Gait belt Nurse Communication: Mobility status  Activity Tolerance: Patient limited by fatigue Patient left: in chair;with call bell/phone within reach;with chair alarm set;with nursing/sitter in room  OT Visit Diagnosis: Unsteadiness on feet (R26.81);Muscle weakness (generalized) (M62.81);Cognitive communication deficit (R41.841);Hemiplegia and hemiparesis Symptoms  and signs involving cognitive functions: Cerebral infarction Hemiplegia - Right/Left: Left Hemiplegia - dominant/non-dominant: Non-Dominant Hemiplegia - caused by: Cerebral infarction                Time: 1016-1050 OT Time Calculation (min): 34 min Charges:  OT General  Charges $OT Visit: 1 Visit OT Evaluation $OT Eval Moderate Complexity: 1 Mod OT Treatments $Self Care/Home Management : 8-22 mins  Ebony Hail Harold Hedge) Marsa Aris OTR/L Acute Rehabilitation Services Pager: [phone] Office: [place] 12/18/2018, 11:06 AM

## 2018-12-18 NOTE — Consult Note (Addendum)
Requesting Physician: Dr. Vincenza Hews    Chief Complaint: Left side weakness  History obtained from: Patient and Chart   HPI:                                                                                                                                       Crystal Moore is a 73 y.o. female past medical history of CVA, type 2 diabetes, COPD, seizures not on antiepileptics, history of tobacco abuse presents to the ED with worsening left-sided weakness and fall.  Patient was brought to the hospital while her granddaughter saw patient fall at 10:00 while trying to get up and her left leg was completely limp.  Her prior CVA left her with residual left hemiparesis, but she can normally walk with a walker.  Patient is no longer taking aspirin after it was discontinued when patient presented in November 2019 with ischemic colitis.  An MRI brain was obtained in the emergency room which showed right ACA infarction. She was admitted to medicine service and neurology was consulted for further recommendations.  Past h/o of stroke/seizure Reviewed her records, unable to find any information regarding her old stroke.  She did present in 2019 with left hemiparesis.  This was in the setting of severe hypotension.  Felt it was syncope and was not started on AEDs at the time.  Her last seizure was 8 years ago.   Date last known well: 5.22.20 tPA Given: No, outside window    Past Medical History:  Diagnosis Date  . Arthritis   . COPD (chronic obstructive pulmonary disease) ([place])   . CVA (cerebral vascular accident) ([place])   . Diabetes ([place])   . Diabetes mellitus without complication ([place])   . Hemiparesis affecting left side as late effect of cerebrovascular accident (CVA) ([place])   . Pyelonephritis   . Seizures ([place])   . Stroke ([place])   . Tobacco use   . Tobacco use disorder   . UTI (urinary tract infection)     Past Surgical History:  Procedure Laterality Date  . TUBAL LIGATION      Family History   Problem Relation Age of Onset  . Diabetes Mother   . Diabetes Sister   . Diabetes Brother    Social History:  reports that she has been smoking cigarettes. She has a 40.00 pack-year smoking history. She has never used smokeless tobacco. She reports that she does not drink alcohol or use drugs.  Allergies: No Known Allergies  Medications:  I reviewed home medications   ROS:                                                                                                                                     14 systems reviewed and negative except above   Examination:                                                                                                      General: Appears well-developed  Psych: Affect appropriate to situation Eyes: No scleral injection HENT: No OP obstrucion Head: Normocephalic.  Cardiovascular: Normal rate and regular rhythm.  Respiratory: Effort normal and breath sounds normal to anterior ascultation GI: Soft.  No distension. There is no tenderness.  Skin: WDI    Neurological Examination Mental Status: Alert, oriented, poorhistorian.  Speech slow without evidence of aphasia. Mild dysarthria. Able to follow simple commands.  Cranial Nerves: II: Visual fields : left homonymous hemianopsia III,IV, VI: ptosis not present, extra-ocular motions intact bilaterally, pupils equal, round, reactive to light and accommodation VII: smile symmetric VIII: hearing normal bilaterally IX,X: uvula rises symmetrically XII: midline tongue extension Motor: Right : Upper extremity   5/5    Left:     Upper extremity   0/5, contracted  Lower extremity   5/5     Lower extremity   0/5 Tone and bulk: spasticity in the left arm and leg Sensory: Pinprick and light touch intact throughout, bilaterally Deep Tendon Reflexes: 3+ over left biceps, patella Ankle  clonus absent.  Plantars: Right: downgoing   Left: upgoing Cerebellar: no gross ataxia on the right side  Gait: unable to walk     Lab Results: Basic Metabolic Panel: Recent Labs  Lab 12/17/18 1735  NA 134*  K 3.4*  CL 97*  CO2 24  GLUCOSE 173*  BUN 9  CREATININE 0.64  CALCIUM 8.9  MG 1.9    CBC: Recent Labs  Lab 12/17/18 1735  WBC 9.8  NEUTROABS 6.7  HGB 13.5  HCT 39.9  MCV 80.6  PLT 208    Coagulation Studies: No results for input(s): LABPROT, INR in the last 72 hours.  Imaging: Ct Head Wo Contrast  Result Date: 12/17/2018 CLINICAL DATA:  Found down after possible fall this morning. Previous strokes. EXAM: CT HEAD WITHOUT CONTRAST CT CERVICAL SPINE WITHOUT CONTRAST TECHNIQUE: Multidetector CT imaging of the head and cervical spine was performed following the standard protocol without intravenous contrast. Multiplanar CT image reconstructions of the cervical spine were also  generated. COMPARISON:  None. FINDINGS: CT HEAD FINDINGS Brain: Moderate ventriculomegaly related to generalized parenchymal volume loss. Chronic small vessel ischemic changes are seen throughout the bilateral periventricular and subcortical white matter regions. Chronic lacunar infarcts are present within the bilateral basal ganglia regions, RIGHT greater than LEFT. Small old infarct also noted within the RIGHT cerebellum. There is no mass, hemorrhage, mass effect or other signs of acute parenchymal abnormality. Vascular: Chronic calcified atherosclerotic changes of the large vessels at the skull base. No unexpected hyperdense vessel. Skull: Normal. Negative for fracture or focal lesion. Sinuses/Orbits: No acute finding. Other: None. CT CERVICAL SPINE FINDINGS Alignment: No evidence of acute vertebral body subluxation. Skull base and vertebrae: No fracture line or displaced fracture fragment appreciated. Facet joints appear normally aligned throughout. Soft tissues and spinal canal: No prevertebral  fluid or swelling. No visible canal hematoma. Disc levels: Degenerative changes of the mid and lower cervical spine, mild to moderate in degree with associated disc space narrowings and osseous spurring. Associated disc-osteophytic bulge at the C5-6 level causing moderate central canal stenosis with possible mass effect on the anterior portion of the cervical cord. No more than mild central canal stenosis at the remainder of the cervical levels. Upper chest: No acute findings. Other: Bilateral carotid atherosclerosis. IMPRESSION: 1. No acute intracranial abnormality. No intracranial mass, hemorrhage or edema. No skull fracture. Atrophy and chronic ischemic changes, as detailed above. 2. No fracture or acute subluxation within the cervical spine. Degenerative changes of the cervical spine, mild to moderate in degree, as detailed above. 3. Carotid atherosclerosis. Electronically Signed   By: Franki Cabot M.D.   On: 12/17/2018 18:08   Ct Cervical Spine Wo Contrast  Result Date: 12/17/2018 CLINICAL DATA:  Found down after possible fall this morning. Previous strokes. EXAM: CT HEAD WITHOUT CONTRAST CT CERVICAL SPINE WITHOUT CONTRAST TECHNIQUE: Multidetector CT imaging of the head and cervical spine was performed following the standard protocol without intravenous contrast. Multiplanar CT image reconstructions of the cervical spine were also generated. COMPARISON:  None. FINDINGS: CT HEAD FINDINGS Brain: Moderate ventriculomegaly related to generalized parenchymal volume loss. Chronic small vessel ischemic changes are seen throughout the bilateral periventricular and subcortical white matter regions. Chronic lacunar infarcts are present within the bilateral basal ganglia regions, RIGHT greater than LEFT. Small old infarct also noted within the RIGHT cerebellum. There is no mass, hemorrhage, mass effect or other signs of acute parenchymal abnormality. Vascular: Chronic calcified atherosclerotic changes of the large  vessels at the skull base. No unexpected hyperdense vessel. Skull: Normal. Negative for fracture or focal lesion. Sinuses/Orbits: No acute finding. Other: None. CT CERVICAL SPINE FINDINGS Alignment: No evidence of acute vertebral body subluxation. Skull base and vertebrae: No fracture line or displaced fracture fragment appreciated. Facet joints appear normally aligned throughout. Soft tissues and spinal canal: No prevertebral fluid or swelling. No visible canal hematoma. Disc levels: Degenerative changes of the mid and lower cervical spine, mild to moderate in degree with associated disc space narrowings and osseous spurring. Associated disc-osteophytic bulge at the C5-6 level causing moderate central canal stenosis with possible mass effect on the anterior portion of the cervical cord. No more than mild central canal stenosis at the remainder of the cervical levels. Upper chest: No acute findings. Other: Bilateral carotid atherosclerosis. IMPRESSION: 1. No acute intracranial abnormality. No intracranial mass, hemorrhage or edema. No skull fracture. Atrophy and chronic ischemic changes, as detailed above. 2. No fracture or acute subluxation within the cervical spine. Degenerative changes of the cervical  spine, mild to moderate in degree, as detailed above. 3. Carotid atherosclerosis. Electronically Signed   By: Franki Cabot M.D.   On: 12/17/2018 18:08   Mr Brain Wo Contrast  Result Date: 12/17/2018 CLINICAL DATA:  73 y/o F; found down after possible fall. Focal neuro deficit, > 6 hrs, stroke suspected. EXAM: MRI HEAD WITHOUT CONTRAST TECHNIQUE: Axial and coronal DWI sequences were acquired. Patient was unable to continue the examination and additional sequences were not acquired. COMPARISON:  12/17/2018 CT of the head. FINDINGS: There are several small foci of reduced diffusion within the right caudate head and the right ACA distribution of medial frontal lobe compatible with acute/early subacute infarction.  No associated gross hemorrhage or mass effect. Very small chronic infarction within the right cerebellum. Confluent white matter hyperintensities are compatible with advanced chronic microvascular ischemic changes and there is diffuse volume loss of the brain. No herniation. IMPRESSION: 1. DWI sequences were acquired.  Limited study. 2. Several small foci of acute/early subacute infarction within the right ACA distribution. No gross hemorrhage or mass effect. These results were called by telephone at the time of interpretation on 12/17/2018 at 10:25 pm to Dr. Trinidad Curet , who verbally acknowledged these results. Electronically Signed   By: Kristine Garbe M.D.   On: 12/17/2018 22:27   Dg Pelvis Portable  Result Date: 12/17/2018 CLINICAL DATA:  Fall. EXAM: PORTABLE PELVIS 1-2 VIEWS COMPARISON:  None. FINDINGS: The bones appear adequately mineralized. No evidence of acute fracture or dislocation. There are mild degenerative changes of the hips and sacroiliac joints. Iliofemoral atherosclerosis noted. IMPRESSION: No evidence of acute pelvic injury. Electronically Signed   By: Richardean Sale M.D.   On: 12/17/2018 17:22   Dg Chest Portable 1 View  Result Date: 12/17/2018 CLINICAL DATA:  Fall. EXAM: PORTABLE CHEST 1 VIEW COMPARISON:  Radiographs 01/12/2018.  CT 02/15/2018. FINDINGS: 1642 hours. Patient is mildly rotated to the right. The heart size and mediastinal contours are stable with aortic atherosclerosis. There is chronic fibrotic interstitial lung disease with mild superimposed atelectasis at the right lung base. No confluent airspace opacity, pleural effusion or pneumothorax identified. There is a small calcified right lung granuloma. No evidence of acute fracture. Telemetry leads overlie the chest. IMPRESSION: No acute posttraumatic findings in the chest. Mild right lower lobe atelectasis superimposed on chronic fibrotic interstitial lung disease. Electronically Signed   By: Richardean Sale  M.D.   On: 12/17/2018 17:21     ASSESSMENT AND PLAN  73 y.o. female past medical history of CVA, type 2 diabetes, COPD, seizures not on antiepileptics, history of tobacco abuse presents to the ED with worsening left-sided weakness and fall due to Right ACA stroke noted on MRI brain.  CT angiogram shows multi focal intracranial and extracranial stenosis, severe right ACA and left P2 stenosis, moderate to severe ICA stenosis bilaterally.  Patient also has 70% severe proximal right ICA stenosis as well as 60% proximal left ACA stenosis.   Right ACA stroke  Intracranial atherosclerotic disease Asymptomatic bilateral carotid stenosis  Risk factors: DM, HLD Etiology: likely ICAD  Recommend # Carotid doppler and outpatient vascular surgery consult  #Transthoracic Echo  # Start patient on ASA 325mg  daily, if patient tolerates ASA-then add Plavix.  Dual antiplatelets for 3 months given intracranial and extracranial atherosclerotic disease #Start or continue Atorvastatin 80 mg/other high intensity statin # BP goal: permissive HTN upto 220/120 mmHg  # HBAIC and Lipid profile # Telemetry monitoring # Frequent neuro checks # NPO until passes  stroke swallow screen  Please page stroke NP  Or  PA  Or MD from 8am -4 pm  as this patient from this time will be  followed by the stroke.   You can look them up on [email or web address]  Password [facility]      [facility] Pager Number [phone]

## 2018-12-18 NOTE — Evaluation (Signed)
Physical Therapy Evaluation Patient Details Name: Crystal Moore MRN: [id number] DOB: 06/21/1946 Today's Date: 12/18/2018   History of Present Illness  Crystal Moore is a 73 y.o. female with medical history significant of arthritis, COPD, CVA with residual left hemiparesis, type 2 diabetes, seizures not on antiepileptics presenting to the hospital for evaluation after a fall. MRI revealed several small foci of acute/early subactue infarction with in the R ACA distribution.  Clinical Impression  Pt admitted with above. Spoke with daughter who stated she was amb with cane within home and used w/c for long distance. Pt has a RN who is there from 8am-6pm and then family during the night. Pt was bathing with supervision and able to feed self and cognitively with it. Pt now requiring mod/maxAx1-2 for all transfers and ADLs, pt now with weaker L LE and is unable to ambulate. Pt with good home set up and support and would benefit from CIR upon d/c to address mentioned deficits and progress towards supervision level of function and ambulation with cane. Acute PT to cont to follow.    Follow Up Recommendations CIR    Equipment Recommendations  None recommended by PT    Recommendations for Other Services Rehab consult     Precautions / Restrictions Precautions Precautions: Fall Restrictions Weight Bearing Restrictions: No      Mobility  Bed Mobility Overal bed mobility: Needs Assistance Bed Mobility: Supine to Sit     Supine to sit: Mod assist;HOB elevated     General bed mobility comments: pt used L UE to pull up on PT, modA to pivot to EOB  Transfers Overall transfer level: Needs assistance Equipment used: (1 person face to fact lift with gait belt) Transfers: Sit to/from [facility] Sit to Stand: Modified independent (Device/Increase time);Mod assist;Max assist Stand pivot transfers: Mod assist;Max assist       General transfer comment: modA to power up, L knee  blocked, max verbal directional cues for sequencing std pvt transfer to the chair, pt unable to toelrate full WBing on L LE without blocking it, dependent for advancement of L LE  Ambulation/Gait             General Gait Details: unable today  Stairs            Wheelchair Mobility    Modified Rankin (Stroke Patients Only) Modified Rankin (Stroke Patients Only) Pre-Morbid Rankin Score: Moderate disability Modified Rankin: Severe disability     Balance Overall balance assessment: Needs assistance Sitting-balance support: Feet supported;No upper extremity supported Sitting balance-Leahy Scale: Fair     Standing balance support: Bilateral upper extremity supported;During functional activity Standing balance-Leahy Scale: Zero Standing balance comment: dependent on physical assist                             Pertinent Vitals/Pain      Home Living Family/patient expects to be discharged to:: Private residence Living Arrangements: Children Available Help at Discharge: Family;Available 24 hours/day;Personal care attendant(RN 8-2pm and then 2-6pm) Type of Home: House Home Access: Stairs to enter Entrance Stairs-Rails: Can reach both Entrance Stairs-Number of Steps: 1 Home Layout: One level Home Equipment: Cane - single point;Shower seat Additional Comments: RN does Conservation officer, nature,     Prior Function Level of Independence: Needs assistance   Gait / Transfers Assistance Needed: was walking with a cane, uses a w/c for long distance  ADL's / Homemaking Assistance Needed: baths self moslty except  back side        Hand Dominance        Extremity/Trunk Assessment   Upper Extremity Assessment Upper Extremity Assessment: LUE deficits/detail LUE Deficits / Details: hemiparesis, no active movement or functionally LUE Sensation: decreased light touch    Lower Extremity Assessment Lower Extremity Assessment: LLE deficits/detail LLE Deficits /  Details: minimal voluntary movment, buckling in standing LLE Sensation: decreased light touch    Cervical / Trunk Assessment Cervical / Trunk Assessment: Kyphotic  Communication   Communication: Expressive difficulties(sometimes garbled speech)  Cognition Arousal/Alertness: Lethargic(easily awoken but difficulty to maintain) Behavior During Therapy: Flat affect Overall Cognitive Status: Impaired/Different from baseline                                        General Comments General comments (skin integrity, edema, etc.): VSS    Exercises     Assessment/Plan    PT Assessment Patient needs continued PT services  PT Problem List Decreased strength;Decreased range of motion;Decreased activity tolerance;Decreased balance;Decreased mobility;Decreased coordination;Decreased cognition;Decreased knowledge of use of DME;Decreased safety awareness;Decreased knowledge of precautions;Impaired tone;Impaired sensation       PT Treatment Interventions DME instruction;Gait training;Stair training;Functional mobility training;Therapeutic activities;Therapeutic exercise;Balance training;Neuromuscular re-education    PT Goals (Current goals can be found in the Care Plan section)  Acute Rehab PT Goals Patient Stated Goal: didn't state PT Goal Formulation: With patient/family Time For Goal Achievement: 12/25/18 Potential to Achieve Goals: Good    Frequency Min 4X/week   Barriers to discharge        Co-evaluation               AM-PAC PT "6 Clicks" Mobility  Outcome Measure Help needed turning from your back to your side while in a flat bed without using bedrails?: A Lot Help needed moving from lying on your back to sitting on the side of a flat bed without using bedrails?: A Lot Help needed moving to and from a bed to a chair (including a wheelchair)?: A Lot Help needed standing up from a chair using your arms (e.g., wheelchair or bedside chair)?: A Lot Help needed to  walk in hospital room?: Total Help needed climbing 3-5 steps with a railing? : Total 6 Click Score: 10    End of Session Equipment Utilized During Treatment: Gait belt Activity Tolerance: Patient tolerated treatment well Patient left: in chair;with call bell/phone within reach;with chair alarm set Nurse Communication: Mobility status;Need for lift equipment(use 2 person std pvt to get back to bed) PT Visit Diagnosis: Unsteadiness on feet (R26.81);Muscle weakness (generalized) (M62.81);Difficulty in walking, not elsewhere classified (R26.2);Hemiplegia and hemiparesis Hemiplegia - Right/Left: Left Hemiplegia - dominant/non-dominant: Non-dominant    Time: 9937-1696 PT Time Calculation (min) (ACUTE ONLY): 24 min   Charges:   PT Evaluation $PT Eval Moderate Complexity: 1 Mod PT Treatments $Therapeutic Activity: 8-22 mins        Kittie Plater, PT, DPT Acute Rehabilitation Services Pager #: [phone] Office #: [phone]   [place] 12/18/2018, 10:02 AM

## 2018-12-18 NOTE — ED Notes (Signed)
Per 3W pt ok to be transported to floor

## 2018-12-18 NOTE — Progress Notes (Signed)
Rehab Admissions Coordinator Note:  Patient was screened by Cleatrice Burke for appropriateness for an Inpatient Acute Rehab Consult per PT and OT recs.  At this time, we are recommending Inpatient Rehab consult. Please place order for consult.  Cleatrice Burke RN MSN 12/18/2018, 12:51 PM  I can be reached at [phone].

## 2018-12-19 LAB — GLUCOSE, CAPILLARY
Glucose-Capillary: 133 mg/dL — ABNORMAL HIGH (ref 70–99)
Glucose-Capillary: 172 mg/dL — ABNORMAL HIGH (ref 70–99)
Glucose-Capillary: 161 mg/dL — ABNORMAL HIGH (ref 70–99)
Glucose-Capillary: 121 mg/dL — ABNORMAL HIGH (ref 70–99)

## 2018-12-19 NOTE — Progress Notes (Signed)
   VASCULAR SURGERY ASSESSMENT & PLAN:   RIGHT BRAIN STROKE:   The patient has several small foci of acute and early subacute infarcts within the right ACA distribution.  The CT angiogram shows both extracranial and intracranial disease and neurology favors maximal medical therapy which I would agree with given her markedly debilitated state currently.  Plan is for dual antiplatelet therapy for 3 months given her intracranial and extracranial disease.  She is also on high-dose statins now.  Her carotid duplex scan yesterday showed minimal disease bilaterally.  Systolic velocity on the right was only 99 cm/s with an end-diastolic velocity of 25 cm/s.  This would again support maximal medical therapy at this point.  I have arranged a follow-up carotid duplex scan and office visit in 3 months.  Vascular surgery will be available as needed.  SUBJECTIVE:   No specific complaints this morning.  No significant change in mental status.  PHYSICAL EXAM:   Vitals:   12/18/18 2012 12/19/18 0054 12/19/18 0300 12/19/18 0437  BP: (!) 158/68 124/69  (!) 179/76  Pulse: 78 (!) 50  71  Resp:  18  18  Temp: 98.8 F (37.1 C) 97.9 F (36.6 C)  97.7 F (36.5 C)  TempSrc: Oral Oral  Oral  SpO2: 97% 92%  98%  Weight:   62 kg   Height:   5\' 2"  (1.575 m)    Persistent left hemiplegia  LABS:   CAROTID DUPLEX: I have independently interpreted her carotid duplex scan yesterday.  There is a less than 39% stenosis bilaterally.  On the right side, which is the side of concern, the peak systolic velocity was 99 cm/s.  The end-diastolic velocity was 25 cm/s.  She does have significant calcific disease which can sometimes obscure higher velocities.  ECHO: Low normal systolic function.  Ejection fraction 50 to 55%.  There was mild diastolic dysfunction and mild LVH.   CBG (last 3)  Recent Labs    12/18/18 1619 12/18/18 2135 12/19/18 0655  GLUCAP 132* 150* 133*    PROBLEM LIST:    Principal Problem:  Acute CVA (cerebrovascular accident) ([place]) Active Problems:   COPD (chronic obstructive pulmonary disease) ([facility])   Tobacco abuse   Type 2 diabetes mellitus ([facility])   Hypokalemia   CURRENT MEDS:   . aspirin  325 mg Oral Daily  . atorvastatin  80 mg Oral q1800  . clopidogrel  75 mg Oral Daily  . enoxaparin (LOVENOX) injection  40 mg Subcutaneous Daily  . insulin aspart  0-5 Units Subcutaneous QHS  . insulin aspart  0-9 Units Subcutaneous TID WC  . insulin glargine  15 Units Subcutaneous Daily  . nicotine  21 mg Transdermal Daily    Deitra Mayo Beeper: [phone] Office: [phone] 12/19/2018

## 2018-12-19 NOTE — Progress Notes (Signed)
STROKE TEAM PROGRESS NOTE   HISTORY OF PRESENT ILLNESS (per record) Crystal Moore is a 73 y.o. female past medical history of CVA, type 2 diabetes, COPD, seizures not on antiepileptics, history of tobacco abuse presents to the ED with worsening left-sided weakness and fall.  Patient was brought to the hospital while her granddaughter saw patient fall at 10:00 while trying to get up and her left leg was completely limp.  Her prior CVA left her with residual left hemiparesis, but she can normally walk with a walker.  Patient is no longer taking aspirin after it was discontinued when patient presented in November 2019 with ischemic colitis.  An MRI brain was obtained in the emergency room which showed right ACA infarction. She was admitted to medicine service and neurology was consulted for further recommendations.  Past h/o of stroke/seizure Reviewed her records, unable to find any information regarding her old stroke.  She did present in 2019 with left hemiparesis.  This was in the setting of severe hypotension.  Felt it was syncope and was not started on AEDs at the time.  Her last seizure was 8 years ago.   Date last known well: 5.22.20 tPA Given: No, outside window   SUBJECTIVE (INTERVAL HISTORY) Her nurse is at bedside.  Patient is drowsy but arousable, pleasant, following simple commands but not answering questions but does smile and wave goodbye.  Moving right arm, waves to me, not moving left side to stim.  Can track examiner across room.  Easily arousable.    OBJECTIVE Vitals:   12/19/18 0300 12/19/18 0437 12/19/18 0812 12/19/18 1135  BP:  (!) 179/76 104/82 (!) 105/92  Pulse:  71 61 (!) 58  Resp:  18 17 18   Temp:  97.7 F (36.5 C) 98 F (36.7 C) 97.6 F (36.4 C)  TempSrc:  Oral Axillary Axillary  SpO2:  98% 98% 98%  Weight: 62 kg     Height: 5\' 2"  (1.575 m)       CBC:  Recent Labs  Lab 12/17/18 1735  WBC 9.8  NEUTROABS 6.7  HGB 13.5  HCT 39.9  MCV 80.6   PLT 161    Basic Metabolic Panel:  Recent Labs  Lab 12/17/18 1735 12/18/18 0355  NA 134* 134*  K 3.4* 3.8  CL 97* 100  CO2 24 23  GLUCOSE 173* 229*  BUN 9 7*  CREATININE 0.64 0.69  CALCIUM 8.9 8.6*  MG 1.9  --     Lipid Panel:     Component Value Date/Time   CHOL 166 12/18/2018 0355   TRIG 92 12/18/2018 0355   HDL 48 12/18/2018 0355   CHOLHDL 3.5 12/18/2018 0355   VLDL 18 12/18/2018 0355   LDLCALC 100 (H) 12/18/2018 0355   HgbA1c:  Lab Results  Component Value Date   HGBA1C 12.3 (H) 12/18/2018   Urine Drug Screen: No results found for: LABOPIA, COCAINSCRNUR, LABBENZ, AMPHETMU, THCU, LABBARB  Alcohol Level No results found for: ETH  IMAGING  Ct Angio Head W Or Wo Contrast Ct Angio Neck W Or Wo Contrast 12/18/2018 IMPRESSION:   CTA neck bone  1. Right proximal ICA severe 70% stenosis stenosis secondary to calcified plaque.  2. Left proximal ICA moderate 60% stenosis secondary to mixed plaque.  3. Occluded left vertebral artery from origin to C4-5 level. Patent vessel above the C4-5 level, probably retrograde flow.  4. Right vertebral artery widely patent without high-grade stenosis, dissection, or aneurysm.  5. Cervical spondylosis greatest at C4-C6  levels with there is approximately moderate spinal canal stenosis, prominent disc displacements, and possible cord impingement.   CTA HEAD:  1. Patent anterior and posterior intracranial circulation. No proximal large vessel occlusion, aneurysm, or vascular malformation identified.  2. Moderate to severe right A2 stenosis and attenuated downstream right ACA.  3. Intracranial atherosclerosis with right greater than left ICA moderate paraclinoid stenosis, moderate left P1 and severe left P2 stenosis, as well as multiple segments of mild stenosis throughout the anterior and posterior circulations.   Ct Head Wo Contrast 12/17/2018 IMPRESSION:  1. No acute intracranial abnormality. No intracranial mass, hemorrhage or  edema. No skull fracture. Atrophy and chronic ischemic changes, as detailed above.  2. No fracture or acute subluxation within the cervical spine. Degenerative changes of the cervical spine, mild to moderate in degree, as detailed above.  3. Carotid atherosclerosis.   Ct Cervical Spine Wo Contrast 12/17/2018 IMPRESSION:  1. No acute intracranial abnormality. No intracranial mass, hemorrhage or edema. No skull fracture. Atrophy and chronic ischemic changes, as detailed above.  2. No fracture or acute subluxation within the cervical spine. Degenerative changes of the cervical spine, mild to moderate in degree, as detailed above.  3. Carotid atherosclerosis.   Mr Brain Wo Contrast 12/17/2018 IMPRESSION:  1. DWI sequences were acquired.  Limited study.  2. Several small foci of acute/early subacute infarction within the right ACA distribution. No gross hemorrhage or mass effect.   Dg Pelvis Portable 12/17/2018 IMPRESSION:  No evidence of acute pelvic injury.   Dg Chest Portable 1 View 12/17/2018 IMPRESSION:  No acute posttraumatic findings in the chest. Mild right lower lobe atelectasis superimposed on chronic fibrotic interstitial lung disease.   Vas US Carotid 12/19/2018 Summary:  Right Carotid: Velocities in the right ICA are consistent with a 1-39% stenosis. Unable to match values found in the CTA neck performed on 12/18/18, likely due to poor visualization of calcified arteries.  Left Carotid: Velocities in the left ICA are consistent with a 1-39% stenosis. Unable to match values found in the CTA neck performed on 12/18/18, likely due to poor visualization of calcified arteries.  Vertebrals:  Right vertebral artery demonstrates retrograde flow. Left vertebral artery demonstrates no discernable flow. Left vertebral artery demonstrates an occlusion.  Subclavians: Normal flow hemodynamics were seen in the right subclavian artery. Left antegrade flow.    Transthoracic Echocardiogram   IMPRESSIONS  1. The left ventricle has low normal systolic function, with an ejection fraction of 50-55%. The cavity size was normal. There is mildly increased left ventricular wall thickness. Left ventricular diastolic Doppler parameters are consistent with impaired relaxation.  2. Left atrial size was mildly dilated.  3. The mitral valve is grossly normal.  4. The tricuspid valve is grossly normal.  5. The aortic valve is tricuspid. Mild thickening of the aortic valve. No stenosis of the aortic valve.  6. Normal LV systolic function; mild diastolic dysfunction; mild LVH; mild LAE.   EKG - SR rate 76 BPM. (See cardiology reading for complete details)    PHYSICAL EXAM Blood pressure (!) 105/92, pulse (!) 58, temperature 97.6 F (36.4 C), temperature source Axillary, resp. rate 18, height 5\' 2"  (1.575 m), weight 62 kg, SpO2 98 %.    Regular rate rhythm, breath sounds clear to auscultation, abdomen soft non-distended.  Patient is drowsy but easily arousable.  She is a poor historian.  She has mild dysarthria, no suggestion of aphasia, able to follow simple commands with visual cues, diminish speech, does not blink  on the left, pupils equally round and reactive to light, extraocular movements intact, face appears symmetric, hearing intact to voice, midline tongue extension, moving right and left leg antigravity, not moving left arm or left leg or withdrawing to stimuli, increased tone on the left.    ASSESSMENT/PLAN Crystal Moore is a 73 y.o. female with history of CVA, type 2 diabetes, COPD, seizures not on antiepileptics, history of tobacco abuse presenting with recent fall and inceased Lt sided weakness. She did not receive IV t-PA due to late presentation (>4.5 hours from time of onset).   Stroke:  Several small foci of acute/early subacute infarction within the right ACA distribution - thromboembolic from vessel disease- Rt ICA and ACA stenosis.  Resultant left hemiplegia, left  hemianopia, dysarthria.  CT head -  No acute intracranial abnormality  MRI head -  Several small foci of acute/early subacute infarction within the right ACA distribution  MRA head - not performed  CTA H&N - Moderate to severe right A2 stenosis and attenuated downstream right ACA. Intracranial atherosclerosis with right greater than left ICA moderate paraclinoid stenosis, moderate left P1 and severe left P2 stenosis,  Carotid Doppler - see above report - carotids 1-39% stenosis. Lt VA occlusion.  2D Echo  - EF 50 - 55%. No cardiac source of emboli identified.   Sars Corona Virus 2  - negative  LDL - 100  HgbA1c - 12.3  UDS - not performed  VTE prophylaxis - Lovenox  Diet - Heart healthy / carb modified with thin liquids.  No antithrombotic prior to admission, now on aspirin 325 mg daily and clopidogrel 75 mg daily.  Patient should be discharged on dual antiplatelet for 3 months and then aspirin alone.  Patient counseled to be compliant with her antithrombotic medications  Ongoing aggressive stroke risk factor management  Therapy recommendations:  CIR  Disposition:  Pending  Hypertension  Stable . Permissive hypertension (OK if < 220/120) but gradually normalize in 5-7 days . Long-term BP goal normotensive  Hyperlipidemia  Lipid lowering medication PTA:  Zocor 40 mg daily  LDL 100, goal < 70  Current lipid lowering medication: Lipitor 80 mg daily  Continue statin at discharge  Diabetes  HgbA1c 12.3, goal < 7.0  Uncontrolled  Other Stroke Risk Factors  Advanced age  Cigarette smoker - advised to stop smoking  Hx stroke/TIA   Other Active Problems  Rt ICA stenosis - Vasc Surg consult - Dr Scot Dock -> possible Rt CEA in future.      Hospital day # 2  Stroke will sign off at this time. Personally examined patient and images, and have participated in and made any corrections needed to history, physical, neuro exam,assessment and plan as stated  above.  I have personally obtained the history, evaluated lab date, reviewed imaging studies and agree with radiology interpretations.    Sarina Ill, MD Stroke Neurology   A total of 35 minutes was spent for the care of this patient, spent on counseling patient and family on different diagnostic and therapeutic options, counseling and coordination of care, riskd ans benefits of management, compliance, or risk factor reduction and education.   To contact Stroke Continuity provider, please refer to [email or web address] After hours, contact General Neurology

## 2018-12-19 NOTE — Progress Notes (Signed)
PROGRESS NOTE    Crystal Moore  MEQ:[id number] DOB: 01/03/1946 DOA: 12/17/2018 PCP: [name], MD  Brief Narrative:  Crystal Moore is a 73 y.o. female with medical history significant of arthritis, COPD, CVA with residual left hemiparesis, type 2 diabetes, seizures not on antiepileptics presenting to the hospital for evaluation after a fall. COVID-19 rapid test negative.  UA not suggestive of infection.  Chest x-ray without evidence of pneumonia or acute posttraumatic finding.  Pelvic x-ray negative for fracture.  CT head negative for acute finding.  CT C-spine negative for fracture or acute subluxation.  MRI brain showing several small foci of acute/early subacute infarction within the right ACA distribution.   Assessment & Plan:   Principal Problem:   Acute CVA (cerebrovascular accident) ([place]) Active Problems:   COPD (chronic obstructive pulmonary disease) (H[facility])   Tobacco abuse   Type 2 diabetes mellitus ([facility])   Hypokalemia    Right  ACA infarction: Started on aspirin 325 mg , plavix added today. Neurology consulted and recommendations. Neurology recommended 3 months of dual anti platelet agents, then aspirin alone.  LDL is 100, INCREASED the zocor 40 mg to lipitor 80 mg daily.  Vascular surgery consulted for bil carotid stenosis. Outpatient follow up with vascular as recommended.  PT, OT. SLP evaluation recommending CIR.    COPD: no wheezing.  Stable.    Type 2 DM: Uncontrolled with hyperglycemia CBG (last 3)  Recent Labs    12/18/18 2135 12/19/18 0655 12/19/18 1133  GLUCAP 150* 133* 172*  A1c is 12.3 Resume long acting and SSI.    Hypertension:  Better controlled.     DVT prophylaxis: lovenox.  Code Status: full code.  Family Communication: none at bedside, will call family to update.  Disposition Plan: pending clinical improvement. And CIR eval.    Consultants:   Neurology  Vascular surgery.    Procedures: CAROTID DUPLEX.   CTA of  the head and neck.   Echocardiogram.   Antimicrobials:none.   Subjective: No chest pain or sob. No new complaints.   Objective: Vitals:   12/19/18 0437 12/19/18 0812 12/19/18 1135 12/19/18 1617  BP: (!) 179/76 104/82 (!) 105/92 102/74  Pulse: 71 61 (!) 58 83  Resp: 18 17 18 16   Temp: 97.7 F (36.5 C) 98 F (36.7 C) 97.6 F (36.4 C) 98.1 F (36.7 C)  TempSrc: Oral Axillary Axillary Axillary  SpO2: 98% 98% 98% 97%  Weight:      Height:        Intake/Output Summary (Last 24 hours) at 12/19/2018 1629 Last data filed at 12/19/2018 1300 Gross per 24 hour  Intake 1450.2 ml  Output 1200 ml  Net 250.2 ml   Filed Weights   12/19/18 0300  Weight: 62 kg    Examination:  General exam: Appears calm and comfortable  Respiratory system: Clear to auscultation. Respiratory effort normal. Cardiovascular system: S1 & S2 heard, RRR. Gastrointestinal system: Abdomen is nondistended, soft and nontender. No organomegaly or masses felt. Normal bowel sounds heard. Central nervous system: Alert and oriented to place and person only.  Extremities: no pedal edema.  Skin: No rashes, lesions or ulcers Psychiatry:  Mood & affect appropriate.     Data Reviewed: I have personally reviewed following labs and imaging studies  CBC: Recent Labs  Lab 12/17/18 1735  WBC 9.8  NEUTROABS 6.7  HGB 13.5  HCT 39.9  MCV 80.6  PLT 297   Basic Metabolic Panel: Recent Labs  Lab 12/17/18 1735 12/18/18  0355  NA 134* 134*  K 3.4* 3.8  CL 97* 100  CO2 24 23  GLUCOSE 173* 229*  BUN 9 7*  CREATININE 0.64 0.69  CALCIUM 8.9 8.6*  MG 1.9  --    GFR: Estimated Creatinine Clearance: 55.1 mL/min (by C-G formula based on SCr of 0.69 mg/dL). Liver Function Tests: Recent Labs  Lab 12/17/18 1735  AST 19  ALT 16  ALKPHOS 86  BILITOT 0.5  PROT 6.7  ALBUMIN 3.3*   No results for input(s): LIPASE, AMYLASE in the last 168 hours. No results for input(s): AMMONIA in the last 168 hours. Coagulation  Profile: No results for input(s): INR, PROTIME in the last 168 hours. Cardiac Enzymes: Recent Labs  Lab 12/17/18 1735  CKTOTAL 88   BNP (last 3 results) No results for input(s): PROBNP in the last 8760 hours. HbA1C: Recent Labs    12/18/18 0355  HGBA1C 12.3*   CBG: Recent Labs  Lab 12/18/18 1344 12/18/18 1619 12/18/18 2135 12/19/18 0655 12/19/18 1133  GLUCAP 139* 132* 150* 133* 172*   Lipid Profile: Recent Labs    12/18/18 0355  CHOL 166  HDL 48  LDLCALC 100*  TRIG 92  CHOLHDL 3.5   Thyroid Function Tests: No results for input(s): TSH, T4TOTAL, FREET4, T3FREE, THYROIDAB in the last 72 hours. Anemia Panel: No results for input(s): VITAMINB12, FOLATE, FERRITIN, TIBC, IRON, RETICCTPCT in the last 72 hours. Sepsis Labs: No results for input(s): PROCALCITON, LATICACIDVEN in the last 168 hours.  Recent Results (from the past 240 hour(s))  SARS Coronavirus 2 (CEPHEID - Performed in [place] hospital lab), Hosp Order     Status: None   Collection Time: 12/17/18  5:07 PM  Result Value Ref Range Status   SARS Coronavirus 2 NEGATIVE NEGATIVE Final    Comment: (NOTE) If result is NEGATIVE SARS-CoV-2 target nucleic acids are NOT DETECTED. The SARS-CoV-2 RNA is generally detectable in upper and lower  respiratory specimens during the acute phase of infection. The lowest  concentration of SARS-CoV-2 viral copies this assay can detect is 250  copies / mL. A negative result does not preclude SARS-CoV-2 infection  and should not be used as the sole basis for treatment or other  patient management decisions.  A negative result may occur with  improper specimen collection / handling, submission of specimen other  than nasopharyngeal swab, presence of viral mutation(s) within the  areas targeted by this assay, and inadequate number of viral copies  (<250 copies / mL). A negative result must be combined with clinical  observations, patient history, and epidemiological  information. If result is POSITIVE SARS-CoV-2 target nucleic acids are DETECTED. The SARS-CoV-2 RNA is generally detectable in upper and lower  respiratory specimens dur ing the acute phase of infection.  Positive  results are indicative of active infection with SARS-CoV-2.  Clinical  correlation with patient history and other diagnostic information is  necessary to determine patient infection status.  Positive results do  not rule out bacterial infection or co-infection with other viruses. If result is PRESUMPTIVE POSTIVE SARS-CoV-2 nucleic acids MAY BE PRESENT.   A presumptive positive result was obtained on the submitted specimen  and confirmed on repeat testing.  While 2019 novel coronavirus  (SARS-CoV-2) nucleic acids may be present in the submitted sample  additional confirmatory testing may be necessary for epidemiological  and / or clinical management purposes  to differentiate between  SARS-CoV-2 and other Sarbecovirus currently known to infect humans.  If clinically indicated  additional testing with an alternate test  methodology [phone]) is advised. The SARS-CoV-2 RNA is generally  detectable in upper and lower respiratory sp ecimens during the acute  phase of infection. The expected result is Negative. Fact Sheet for Patients:  StrictlyIdeas.no Fact Sheet for Healthcare Providers: BankingDealers.co.za This test is not yet approved or cleared by the [place] FDA and has been authorized for detection and/or diagnosis of SARS-CoV-2 by FDA under an Emergency Use Authorization (EUA).  This EUA will remain in effect (meaning this test can be used) for the duration of the COVID-19 declaration under Section 564(b)(1) of the Act, 21 U.S.C. section 360bbb-3(b)(1), unless the authorization is terminated or revoked sooner. Performed at [facility], [place] [address], [place], [place] [address]          Radiology  Studies: Ct Angio Head W Or Wo Contrast  Result Date: 12/18/2018 CLINICAL DATA:  73 y/o  F; stroke for follow-up. EXAM: CT ANGIOGRAPHY HEAD AND NECK TECHNIQUE: Multidetector CT imaging of the head and neck was performed using the standard protocol during bolus administration of intravenous contrast. Multiplanar CT image reconstructions and MIPs were obtained to evaluate the vascular anatomy. Carotid stenosis measurements (when applicable) are obtained utilizing NASCET criteria, using the distal internal carotid diameter as the denominator. CONTRAST:  65mL OMNIPAQUE IOHEXOL 350 MG/ML SOLN COMPARISON:  12/17/2018 MRI of the head. FINDINGS: CTA NECK FINDINGS Aortic arch: Bovine variant branching. Imaged portion shows no evidence of aneurysm or dissection. No significant stenosis of the major arch vessel origins. Atherosclerosis of the aortic arch. Right carotid system: Dense calcified plaque of the right carotid bifurcation with severe 70% proximal ICA stenosis. Severe stenosis origin of right ECA. Left carotid system: Mixed plaque of the left carotid bifurcation with moderate 60% proximal ICA stenosis. Severe stenosis of the origin of right ECA. Vertebral arteries: Left V1 and V2 are occluded to the C4-5 level. The vessel cranial to the C4-5 level is irregular and attenuated. The right dominant vertebral artery is widely patent without high-grade stenosis, dissection, or aneurysm. Skeleton: No acute finding. Cervical spondylosis with predominant discogenic degenerative changes, greatest at the C4-C6 levels. At C4-C6 there are endplate marginal osteophytes and disc protrusions resulting in approximately moderate spinal canal stenosis, possible underlying cord impingement. Other neck: 14 mm nodule in the right lobe of the thyroid gland. Upper chest: Fibrosis and emphysema in the lung apices. Review of the MIP images confirms the above findings CTA HEAD FINDINGS Anterior circulation: Calcified plaque of the right  carotid siphon with moderate paraclinoid stenosis. Calcified plaque of the left horizontal petrous segment of ICA with mild stenosis and moderate left paraclinoid ICA stenosis. Moderate to severe stenosis of the right A2 with attenuation of the downstream right ACA (series 11, image 16). Mild right distal M1 stenosis. Otherwise no proximal occlusion, aneurysm, or vascular malformation. Posterior circulation: Tandem segments of mild-to-moderate stenosis of the left intracranial vertebral artery. Widely patent right vertebral artery. Mild right P2 stenosis. Moderate left P1 and severe left P2 stenosis. Otherwise no proximal occlusion, aneurysm, or vascular malformation. Venous sinuses: As permitted by contrast timing, patent. Anatomic variants: None significant. Delayed phase: No abnormal intracranial enhancement. Review of the MIP images confirms the above findings IMPRESSION: CTA neck bone 1. Right proximal ICA severe 70% stenosis stenosis secondary to calcified plaque. 2. Left proximal ICA moderate 60% stenosis secondary to mixed plaque. 3. Occluded left vertebral artery from origin to C4-5 level. Patent vessel above the C4-5 level, probably retrograde  flow. 4. Right vertebral artery widely patent without high-grade stenosis, dissection, or aneurysm. 5. Cervical spondylosis greatest at C4-C6 levels with there is approximately moderate spinal canal stenosis, prominent disc displacements, and possible cord impingement. CTA HEAD:. 1. Patent anterior and posterior intracranial circulation. No proximal large vessel occlusion, aneurysm, or vascular malformation identified. 2. Moderate to severe right A2 stenosis and attenuated downstream right ACA. 3. Intracranial atherosclerosis with right greater than left ICA moderate paraclinoid stenosis, moderate left P1 and severe left P2 stenosis, as well as multiple segments of mild stenosis throughout the anterior and posterior circulations. Electronically Signed   By: Kristine Garbe M.D.   On: 12/18/2018 04:08   Ct Head Wo Contrast  Result Date: 12/17/2018 CLINICAL DATA:  Found down after possible fall this morning. Previous strokes. EXAM: CT HEAD WITHOUT CONTRAST CT CERVICAL SPINE WITHOUT CONTRAST TECHNIQUE: Multidetector CT imaging of the head and cervical spine was performed following the standard protocol without intravenous contrast. Multiplanar CT image reconstructions of the cervical spine were also generated. COMPARISON:  None. FINDINGS: CT HEAD FINDINGS Brain: Moderate ventriculomegaly related to generalized parenchymal volume loss. Chronic small vessel ischemic changes are seen throughout the bilateral periventricular and subcortical white matter regions. Chronic lacunar infarcts are present within the bilateral basal ganglia regions, RIGHT greater than LEFT. Small old infarct also noted within the RIGHT cerebellum. There is no mass, hemorrhage, mass effect or other signs of acute parenchymal abnormality. Vascular: Chronic calcified atherosclerotic changes of the large vessels at the skull base. No unexpected hyperdense vessel. Skull: Normal. Negative for fracture or focal lesion. Sinuses/Orbits: No acute finding. Other: None. CT CERVICAL SPINE FINDINGS Alignment: No evidence of acute vertebral body subluxation. Skull base and vertebrae: No fracture line or displaced fracture fragment appreciated. Facet joints appear normally aligned throughout. Soft tissues and spinal canal: No prevertebral fluid or swelling. No visible canal hematoma. Disc levels: Degenerative changes of the mid and lower cervical spine, mild to moderate in degree with associated disc space narrowings and osseous spurring. Associated disc-osteophytic bulge at the C5-6 level causing moderate central canal stenosis with possible mass effect on the anterior portion of the cervical cord. No more than mild central canal stenosis at the remainder of the cervical levels. Upper chest: No acute  findings. Other: Bilateral carotid atherosclerosis. IMPRESSION: 1. No acute intracranial abnormality. No intracranial mass, hemorrhage or edema. No skull fracture. Atrophy and chronic ischemic changes, as detailed above. 2. No fracture or acute subluxation within the cervical spine. Degenerative changes of the cervical spine, mild to moderate in degree, as detailed above. 3. Carotid atherosclerosis. Electronically Signed   By: Franki Cabot M.D.   On: 12/17/2018 18:08   Ct Angio Neck W Or Wo Contrast  Result Date: 12/18/2018 CLINICAL DATA:  73 y/o  F; stroke for follow-up. EXAM: CT ANGIOGRAPHY HEAD AND NECK TECHNIQUE: Multidetector CT imaging of the head and neck was performed using the standard protocol during bolus administration of intravenous contrast. Multiplanar CT image reconstructions and MIPs were obtained to evaluate the vascular anatomy. Carotid stenosis measurements (when applicable) are obtained utilizing NASCET criteria, using the distal internal carotid diameter as the denominator. CONTRAST:  60mL OMNIPAQUE IOHEXOL 350 MG/ML SOLN COMPARISON:  12/17/2018 MRI of the head. FINDINGS: CTA NECK FINDINGS Aortic arch: Bovine variant branching. Imaged portion shows no evidence of aneurysm or dissection. No significant stenosis of the major arch vessel origins. Atherosclerosis of the aortic arch. Right carotid system: Dense calcified plaque of the right carotid bifurcation with severe  70% proximal ICA stenosis. Severe stenosis origin of right ECA. Left carotid system: Mixed plaque of the left carotid bifurcation with moderate 60% proximal ICA stenosis. Severe stenosis of the origin of right ECA. Vertebral arteries: Left V1 and V2 are occluded to the C4-5 level. The vessel cranial to the C4-5 level is irregular and attenuated. The right dominant vertebral artery is widely patent without high-grade stenosis, dissection, or aneurysm. Skeleton: No acute finding. Cervical spondylosis with predominant discogenic  degenerative changes, greatest at the C4-C6 levels. At C4-C6 there are endplate marginal osteophytes and disc protrusions resulting in approximately moderate spinal canal stenosis, possible underlying cord impingement. Other neck: 14 mm nodule in the right lobe of the thyroid gland. Upper chest: Fibrosis and emphysema in the lung apices. Review of the MIP images confirms the above findings CTA HEAD FINDINGS Anterior circulation: Calcified plaque of the right carotid siphon with moderate paraclinoid stenosis. Calcified plaque of the left horizontal petrous segment of ICA with mild stenosis and moderate left paraclinoid ICA stenosis. Moderate to severe stenosis of the right A2 with attenuation of the downstream right ACA (series 11, image 16). Mild right distal M1 stenosis. Otherwise no proximal occlusion, aneurysm, or vascular malformation. Posterior circulation: Tandem segments of mild-to-moderate stenosis of the left intracranial vertebral artery. Widely patent right vertebral artery. Mild right P2 stenosis. Moderate left P1 and severe left P2 stenosis. Otherwise no proximal occlusion, aneurysm, or vascular malformation. Venous sinuses: As permitted by contrast timing, patent. Anatomic variants: None significant. Delayed phase: No abnormal intracranial enhancement. Review of the MIP images confirms the above findings IMPRESSION: CTA neck bone 1. Right proximal ICA severe 70% stenosis stenosis secondary to calcified plaque. 2. Left proximal ICA moderate 60% stenosis secondary to mixed plaque. 3. Occluded left vertebral artery from origin to C4-5 level. Patent vessel above the C4-5 level, probably retrograde flow. 4. Right vertebral artery widely patent without high-grade stenosis, dissection, or aneurysm. 5. Cervical spondylosis greatest at C4-C6 levels with there is approximately moderate spinal canal stenosis, prominent disc displacements, and possible cord impingement. CTA HEAD:. 1. Patent anterior and posterior  intracranial circulation. No proximal large vessel occlusion, aneurysm, or vascular malformation identified. 2. Moderate to severe right A2 stenosis and attenuated downstream right ACA. 3. Intracranial atherosclerosis with right greater than left ICA moderate paraclinoid stenosis, moderate left P1 and severe left P2 stenosis, as well as multiple segments of mild stenosis throughout the anterior and posterior circulations. Electronically Signed   By: Kristine Garbe M.D.   On: 12/18/2018 04:08   Ct Cervical Spine Wo Contrast  Result Date: 12/17/2018 CLINICAL DATA:  Found down after possible fall this morning. Previous strokes. EXAM: CT HEAD WITHOUT CONTRAST CT CERVICAL SPINE WITHOUT CONTRAST TECHNIQUE: Multidetector CT imaging of the head and cervical spine was performed following the standard protocol without intravenous contrast. Multiplanar CT image reconstructions of the cervical spine were also generated. COMPARISON:  None. FINDINGS: CT HEAD FINDINGS Brain: Moderate ventriculomegaly related to generalized parenchymal volume loss. Chronic small vessel ischemic changes are seen throughout the bilateral periventricular and subcortical white matter regions. Chronic lacunar infarcts are present within the bilateral basal ganglia regions, RIGHT greater than LEFT. Small old infarct also noted within the RIGHT cerebellum. There is no mass, hemorrhage, mass effect or other signs of acute parenchymal abnormality. Vascular: Chronic calcified atherosclerotic changes of the large vessels at the skull base. No unexpected hyperdense vessel. Skull: Normal. Negative for fracture or focal lesion. Sinuses/Orbits: No acute finding. Other: None. CT CERVICAL SPINE FINDINGS  Alignment: No evidence of acute vertebral body subluxation. Skull base and vertebrae: No fracture line or displaced fracture fragment appreciated. Facet joints appear normally aligned throughout. Soft tissues and spinal canal: No prevertebral fluid or  swelling. No visible canal hematoma. Disc levels: Degenerative changes of the mid and lower cervical spine, mild to moderate in degree with associated disc space narrowings and osseous spurring. Associated disc-osteophytic bulge at the C5-6 level causing moderate central canal stenosis with possible mass effect on the anterior portion of the cervical cord. No more than mild central canal stenosis at the remainder of the cervical levels. Upper chest: No acute findings. Other: Bilateral carotid atherosclerosis. IMPRESSION: 1. No acute intracranial abnormality. No intracranial mass, hemorrhage or edema. No skull fracture. Atrophy and chronic ischemic changes, as detailed above. 2. No fracture or acute subluxation within the cervical spine. Degenerative changes of the cervical spine, mild to moderate in degree, as detailed above. 3. Carotid atherosclerosis. Electronically Signed   By: Franki Cabot M.D.   On: 12/17/2018 18:08   Mr Brain Wo Contrast  Result Date: 12/17/2018 CLINICAL DATA:  73 y/o F; found down after possible fall. Focal neuro deficit, > 6 hrs, stroke suspected. EXAM: MRI HEAD WITHOUT CONTRAST TECHNIQUE: Axial and coronal DWI sequences were acquired. Patient was unable to continue the examination and additional sequences were not acquired. COMPARISON:  12/17/2018 CT of the head. FINDINGS: There are several small foci of reduced diffusion within the right caudate head and the right ACA distribution of medial frontal lobe compatible with acute/early subacute infarction. No associated gross hemorrhage or mass effect. Very small chronic infarction within the right cerebellum. Confluent white matter hyperintensities are compatible with advanced chronic microvascular ischemic changes and there is diffuse volume loss of the brain. No herniation. IMPRESSION: 1. DWI sequences were acquired.  Limited study. 2. Several small foci of acute/early subacute infarction within the right ACA distribution. No gross  hemorrhage or mass effect. These results were called by telephone at the time of interpretation on 12/17/2018 at 10:25 pm to Dr. Trinidad Curet , who verbally acknowledged these results. Electronically Signed   By: Kristine Garbe M.D.   On: 12/17/2018 22:27   Dg Pelvis Portable  Result Date: 12/17/2018 CLINICAL DATA:  Fall. EXAM: PORTABLE PELVIS 1-2 VIEWS COMPARISON:  None. FINDINGS: The bones appear adequately mineralized. No evidence of acute fracture or dislocation. There are mild degenerative changes of the hips and sacroiliac joints. Iliofemoral atherosclerosis noted. IMPRESSION: No evidence of acute pelvic injury. Electronically Signed   By: Richardean Sale M.D.   On: 12/17/2018 17:22   Dg Chest Portable 1 View  Result Date: 12/17/2018 CLINICAL DATA:  Fall. EXAM: PORTABLE CHEST 1 VIEW COMPARISON:  Radiographs 01/12/2018.  CT 02/15/2018. FINDINGS: 1642 hours. Patient is mildly rotated to the right. The heart size and mediastinal contours are stable with aortic atherosclerosis. There is chronic fibrotic interstitial lung disease with mild superimposed atelectasis at the right lung base. No confluent airspace opacity, pleural effusion or pneumothorax identified. There is a small calcified right lung granuloma. No evidence of acute fracture. Telemetry leads overlie the chest. IMPRESSION: No acute posttraumatic findings in the chest. Mild right lower lobe atelectasis superimposed on chronic fibrotic interstitial lung disease. Electronically Signed   By: Richardean Sale M.D.   On: 12/17/2018 17:21   Vas US Carotid  Result Date: 12/19/2018 Carotid Arterial Duplex Study Indications:       CVA. Risk Factors:      Hypertension, hyperlipidemia, Diabetes, prior CVA.  Other Factors:     COPD. Limitations:       Patient movement, respiratory disturbance, patient                    positioning Comparison Study:  12/18/18 CTA neck - R 70% ICA stenosis L 60% ICA stenosis and                    occluded L  vertebral artery Performing Technologist: Oliver Hum RVT  Examination Guidelines: A complete evaluation includes B-mode imaging, spectral Doppler, color Doppler, and power Doppler as needed of all accessible portions of each vessel. Bilateral testing is considered an integral part of a complete examination. Limited examinations for reoccurring indications may be performed as noted.  Right Carotid Findings: +----------+--------+-------+--------+--------------------------------+--------+             PSV cm/s EDV     Stenosis Describe                         Comments                       cm/s                                                        +----------+--------+-------+--------+--------------------------------+--------+  CCA Prox   51       7                smooth and heterogenous                    +----------+--------+-------+--------+--------------------------------+--------+  CCA Distal 40       7                smooth and heterogenous                    +----------+--------+-------+--------+--------------------------------+--------+  ICA Prox   99       25               smooth, heterogenous and                                                         calcific                                   +----------+--------+-------+--------+--------------------------------+--------+  ICA Mid    98       22               smooth and heterogenous                    +----------+--------+-------+--------+--------------------------------+--------+  ICA Distal 82       22                                                          +----------+--------+-------+--------+--------------------------------+--------+  ECA        404      33               smooth, heterogenous and                                                         calcific                                   +----------+--------+-------+--------+--------------------------------+--------+ +----------+--------+-------+--------+-------------------+             PSV  cm/s EDV cms Describe Arm Pressure (mmHG)  +----------+--------+-------+--------+-------------------+  Subclavian 122                                            +----------+--------+-------+--------+-------------------+ +---------+--------+--+--------+--+---------+  Vertebral PSV cm/s 95 EDV cm/s 20 Antegrade  +---------+--------+--+--------+--+---------+  Left Carotid Findings: +----------+--------+-------+--------+--------------------------------+--------+             PSV cm/s EDV     Stenosis Describe                         Comments                       cm/s                                                        +----------+--------+-------+--------+--------------------------------+--------+  CCA Prox   50       8                smooth and heterogenous                    +----------+--------+-------+--------+--------------------------------+--------+  CCA Distal 46       6                smooth and heterogenous                    +----------+--------+-------+--------+--------------------------------+--------+  ICA Prox   126      27               smooth, heterogenous and                                                         calcific                                   +----------+--------+-------+--------+--------------------------------+--------+  ICA Mid    110      12               smooth and heterogenous                    +----------+--------+-------+--------+--------------------------------+--------+  ICA Distal 56       14                                                tortuous  +----------+--------+-------+--------+--------------------------------+--------+  ECA        212      0                smooth, heterogenous and                                                         calcific                                   +----------+--------+-------+--------+--------------------------------+--------+ +----------+--------+--------+--------------+-------------------+  Subclavian PSV cm/s EDV cm/s Describe        Arm Pressure (mmHG)  +----------+--------+--------+--------------+-------------------+                               Antegrade flow                      +----------+--------+--------+--------------+-------------------+ +---------+--------+--------+------+  Vertebral PSV cm/s EDV cm/s Absent  +---------+--------+--------+------+  Summary: Right Carotid: Velocities in the right ICA are consistent with a 1-39% stenosis.                Unable to match values found in the CTA neck performed on                12/18/18, likely due to poor visualization of calcified arteries. Left Carotid: Velocities in the left ICA are consistent with a 1-39% stenosis.               Unable to match values found in the CTA neck performed on 12/18/18,               likely due to poor visualization of calcified arteries. Vertebrals:  Right vertebral artery demonstrates retrograde flow. Left vertebral              artery demonstrates no discernable flow. Left vertebral artery              demonstrates an occlusion. Subclavians: Normal flow hemodynamics were seen in the right subclavian artery.              Left antegrade flow. *See table(s) above for measurements and observations.  Electronically signed by Deitra Mayo MD on 12/19/2018 at 6:43:32 AM.    Final         Scheduled Meds:  aspirin  325 mg Oral Daily   atorvastatin  80 mg Oral q1800   clopidogrel  75 mg Oral Daily   enoxaparin (LOVENOX) injection  40 mg Subcutaneous Daily   insulin aspart  0-5 Units Subcutaneous QHS   insulin aspart  0-9 Units Subcutaneous TID WC   insulin glargine  15 Units Subcutaneous Daily   nicotine  21 mg Transdermal Daily   Continuous Infusions:    LOS: 2 days    Time spent: 26 minutes    Hosie Poisson, MD [facility] Pager [phone]  If 7PM-7AM, please contact night-coverage [email or web address] Password [facility] 12/19/2018, 4:29 PM

## 2018-12-20 LAB — GLUCOSE, CAPILLARY
Glucose-Capillary: 115 mg/dL — ABNORMAL HIGH (ref 70–99)
Glucose-Capillary: 126 mg/dL — ABNORMAL HIGH (ref 70–99)
Glucose-Capillary: 195 mg/dL — ABNORMAL HIGH (ref 70–99)
Glucose-Capillary: 156 mg/dL — ABNORMAL HIGH (ref 70–99)
Glucose-Capillary: 137 mg/dL — ABNORMAL HIGH (ref 70–99)

## 2018-12-20 LAB — BASIC METABOLIC PANEL
Anion gap: 11 (ref 5–15)
BUN: 9 mg/dL (ref 8–23)
CO2: 25 mmol/L (ref 22–32)
Calcium: 9.4 mg/dL (ref 8.9–10.3)
Chloride: 102 mmol/L (ref 98–111)
Creatinine, Ser: 0.68 mg/dL (ref 0.44–1.00)
GFR calc Af Amer: >60 mL/min (ref >60)
GFR calc non Af Amer: >60 mL/min (ref >60)
Glucose, Bld: 153 mg/dL — ABNORMAL HIGH (ref 70–99)
Potassium: 4 mmol/L (ref 3.5–5.1)
Sodium: 138 mmol/L (ref 135–145)

## 2018-12-20 NOTE — Progress Notes (Signed)
Inpatient Diabetes Program Recommendations  AACE/ADA: New Consensus Statement on Inpatient Glycemic Control (2015)  Target Ranges:  Prepandial:   less than 140 mg/dL      Peak postprandial:   less than 180 mg/dL (1-2 hours)      Critically ill patients:  140 - 180 mg/dL   Lab Results  Component Value Date   GLUCAP 195 (H) 12/20/2018   HGBA1C 12.3 (H) 12/18/2018    Review of Glycemic Control Results for Crystal Moore, Crystal Moore (MRN [id number]) as of 12/20/2018 13:21  Ref. Range 12/19/2018 21:14 12/20/2018 06:03 12/20/2018 06:05 12/20/2018 11:48  Glucose-Capillary Latest Ref Range: 70 - 99 mg/dL 121 (H) 115 (H) 126 (H) 195 (H)   Diabetes history: Type 2 Dm Outpatient Diabetes medications: Tresiba 15 units QD, Metformin 1000 mg BID Current orders for Inpatient glycemic control: Lantus 15 units QD, Novolog 0-9 units TID, Novolog 0-5 units QHS  Inpatient Diabetes Program Recommendations:     Spoke with daughter to ensure understanding of caring for mother with diabetes and management with injections. Has seen Dr Loanne Drilling on 06/10/2018. Verified home medications and denies missing doses. Reviewed patient's current A1c of 12.3%. Explained what a A1c is and what it measures. Also reviewed goal A1c with patient, importance of good glucose control @ home, and blood sugar goals. Reviewed patho of DM, need for insulin, role of pancreas, impact of glucose with CVA history, vascular changes and comorbidites. Daughter, confidently, reports that she has been giving injections to her mother since diagnosis. Feels confident in continuing. Reports checking CBGs 4+ times per day with highest reading at 351 mg/dL. Stated interventions appropriately. Encouraged to check a post prandial and write these values down until she follows up with PCP. Even though she reports always giving injections, she did relay that sometimes her grandchildren (ages 87, 68 yo) give the injections. Reviewed proper technique for administration and  encouraged having consistent care giver administer insulin. Also, reports giving more Lantus than prescribed at random times of the day, if blood sugar is too high. Educated daughter on the action of Lantus, the importance of sharing hyperglycemia with providers in order to make adjustments and that this could cause hypoglycemia. Daughter verified that his happens, but not often. Encouraged following up with PCP, provided an endo list, and despite daughter's confidence reviewed importance of sticking to a schedule and educating anyone who will be providing care to give injections as prescribed and using good technique. Appreciative of the support and plan moving forward. Has no further questions at this time.   Thanks, Bronson Curb, MSN, RNC-OB Diabetes Coordinator [phone] (8a-5p)

## 2018-12-20 NOTE — Progress Notes (Signed)
PROGRESS NOTE    Crystal Moore  YBO:[id number] DOB: Aug 06, 1945 DOA: 12/17/2018 PCP: [name], MD  Brief Narrative:  Crystal Moore is a 73 y.o. female with medical history significant of arthritis, COPD, CVA with residual left hemiparesis, type 2 diabetes, seizures not on antiepileptics presenting to the hospital for evaluation after a fall. COVID-19 rapid test negative.  UA not suggestive of infection.  Chest x-ray without evidence of pneumonia or acute posttraumatic finding.  Pelvic x-ray negative for fracture.  CT head negative for acute finding.  CT C-spine negative for fracture or acute subluxation.  MRI brain showing several small foci of acute/early subacute infarction within the right ACA distribution.   Assessment & Plan:   Principal Problem:   Acute CVA (cerebrovascular accident) ([place]) Active Problems:   COPD (chronic obstructive pulmonary disease) (HCC)   Tobacco abuse   Type 2 diabetes mellitus (HCC)   Hypokalemia    Right  ACA infarction: Started on aspirin 325 mg , plavix added . Neurology consulted and recommendations. Neurology recommended 3 months of dual anti platelet agents, then aspirin alone.  LDL is 100, increased the zocor 40 mg to lipitor 80 mg daily.  Vascular surgery consulted for bil carotid stenosis.  Carotid duplex showed 39% stenosis bilaterally,  Outpatient follow up with vascular as recommended.  PT, OT. SLP evaluation recommending CIR.  Awaiting CIR.   COPD: no wheezing.  Stable.    Tobacco abuse  Counseled against use.     Type 2 DM: Uncontrolled with hyperglycemia CBG (last 3)  Recent Labs    12/19/18 2114 12/20/18 0603 12/20/18 0605  GLUCAP 121* 115* 126*  A1c is 12.3 Resume long acting and SSI.    Hypertension:  Better controlled.   Hypokalemia  Replaced.  DVT prophylaxis: lovenox.  Code Status: full code.  Family Communication: none at bedside, updated family on 5/25. Disposition Plan: pending  CIR eval.  Stable  medically for discharge.    Consultants:   Neurology  Vascular surgery.    Procedures: CAROTID DUPLEX.   CTA of the head and neck.   Echocardiogram.   Antimicrobials:none.   Subjective: No chest pain or sob. No new complaints.   Objective: Vitals:   12/19/18 1947 12/19/18 2358 12/20/18 0442 12/20/18 0812  BP: (!) 160/78 (!) 151/69 (!) 156/70 (!) 163/61  Pulse: 68 72 63 66  Resp: 15 10 11 16   Temp: 98.3 F (36.8 C) 97.6 F (36.4 C) 97.7 F (36.5 C) (!) 97.5 F (36.4 C)  TempSrc: Oral Oral Oral Axillary  SpO2: 98% 97% 97% 100%  Weight:      Height:        Intake/Output Summary (Last 24 hours) at 12/20/2018 0928 Last data filed at 12/20/2018 2778 Gross per 24 hour  Intake 1020.08 ml  Output 1500 ml  Net -479.92 ml   Filed Weights   12/19/18 0300  Weight: 62 kg    Examination:  General exam: Appears calm and comfortable  Respiratory system: Clear to auscultation. Respiratory effort normal. No wheezing or rhonchi.  Cardiovascular system: S1 & S2 heard, RRR. Gastrointestinal system: Abdomen is soft non tender non distended bowel sounds good. Central nervous system: Alert and oriented to place and person only.  Extremities: no pedal edema.  Skin: No rashes, lesions or ulcers Psychiatry:  Mood & affect appropriate.     Data Reviewed: I have personally reviewed following labs and imaging studies  CBC: Recent Labs  Lab 12/17/18 1735  WBC 9.8  NEUTROABS  6.7  HGB 13.5  HCT 39.9  MCV 80.6  PLT 295   Basic Metabolic Panel: Recent Labs  Lab 12/17/18 1735 12/18/18 0355  NA 134* 134*  K 3.4* 3.8  CL 97* 100  CO2 24 23  GLUCOSE 173* 229*  BUN 9 7*  CREATININE 0.64 0.69  CALCIUM 8.9 8.6*  MG 1.9  --    GFR: Estimated Creatinine Clearance: 55.1 mL/min (by C-G formula based on SCr of 0.69 mg/dL). Liver Function Tests: Recent Labs  Lab 12/17/18 1735  AST 19  ALT 16  ALKPHOS 86  BILITOT 0.5  PROT 6.7  ALBUMIN 3.3*   No results for  input(s): LIPASE, AMYLASE in the last 168 hours. No results for input(s): AMMONIA in the last 168 hours. Coagulation Profile: No results for input(s): INR, PROTIME in the last 168 hours. Cardiac Enzymes: Recent Labs  Lab 12/17/18 1735  CKTOTAL 88   BNP (last 3 results) No results for input(s): PROBNP in the last 8760 hours. HbA1C: Recent Labs    12/18/18 0355  HGBA1C 12.3*   CBG: Recent Labs  Lab 12/19/18 1133 12/19/18 1654 12/19/18 2114 12/20/18 0603 12/20/18 0605  GLUCAP 172* 161* 121* 115* 126*   Lipid Profile: Recent Labs    12/18/18 0355  CHOL 166  HDL 48  LDLCALC 100*  TRIG 92  CHOLHDL 3.5   Thyroid Function Tests: No results for input(s): TSH, T4TOTAL, FREET4, T3FREE, THYROIDAB in the last 72 hours. Anemia Panel: No results for input(s): VITAMINB12, FOLATE, FERRITIN, TIBC, IRON, RETICCTPCT in the last 72 hours. Sepsis Labs: No results for input(s): PROCALCITON, LATICACIDVEN in the last 168 hours.  Recent Results (from the past 240 hour(s))  SARS Coronavirus 2 (CEPHEID - Performed in [facility] hospital lab), Hosp Order     Status: None   Collection Time: 12/17/18  5:07 PM  Result Value Ref Range Status   SARS Coronavirus 2 NEGATIVE NEGATIVE Final    Comment: (NOTE) If result is NEGATIVE SARS-CoV-2 target nucleic acids are NOT DETECTED. The SARS-CoV-2 RNA is generally detectable in upper and lower  respiratory specimens during the acute phase of infection. The lowest  concentration of SARS-CoV-2 viral copies this assay can detect is 250  copies / mL. A negative result does not preclude SARS-CoV-2 infection  and should not be used as the sole basis for treatment or other  patient management decisions.  A negative result may occur with  improper specimen collection / handling, submission of specimen other  than nasopharyngeal swab, presence of viral mutation(s) within the  areas targeted by this assay, and inadequate number of viral copies  (<250  copies / mL). A negative result must be combined with clinical  observations, patient history, and epidemiological information. If result is POSITIVE SARS-CoV-2 target nucleic acids are DETECTED. The SARS-CoV-2 RNA is generally detectable in upper and lower  respiratory specimens dur ing the acute phase of infection.  Positive  results are indicative of active infection with SARS-CoV-2.  Clinical  correlation with patient history and other diagnostic information is  necessary to determine patient infection status.  Positive results do  not rule out bacterial infection or co-infection with other viruses. If result is PRESUMPTIVE POSTIVE SARS-CoV-2 nucleic acids MAY BE PRESENT.   A presumptive positive result was obtained on the submitted specimen  and confirmed on repeat testing.  While 2019 novel coronavirus  (SARS-CoV-2) nucleic acids may be present in the submitted sample  additional confirmatory testing may be necessary for epidemiological  and / or clinical management purposes  to differentiate between  SARS-CoV-2 and other Sarbecovirus currently known to infect humans.  If clinically indicated additional testing with an alternate test  methodology [phone]) is advised. The SARS-CoV-2 RNA is generally  detectable in upper and lower respiratory sp ecimens during the acute  phase of infection. The expected result is Negative. Fact Sheet for Patients:  StrictlyIdeas.no Fact Sheet for Healthcare Providers: BankingDealers.co.za This test is not yet approved or cleared by the [place] FDA and has been authorized for detection and/or diagnosis of SARS-CoV-2 by FDA under an Emergency Use Authorization (EUA).  This EUA will remain in effect (meaning this test can be used) for the duration of the COVID-19 declaration under Section 564(b)(1) of the Act, 21 U.S.C. section 360bbb-3(b)(1), unless the authorization is terminated or revoked  sooner. Performed at [facility], [place] [address], [place], [place] [address]          Radiology Studies: Vas US Carotid  Result Date: 12/19/2018 Carotid Arterial Duplex Study Indications:       CVA. Risk Factors:      Hypertension, hyperlipidemia, Diabetes, prior CVA. Other Factors:     COPD. Limitations:       Patient movement, respiratory disturbance, patient                    positioning Comparison Study:  12/18/18 CTA neck - R 70% ICA stenosis L 60% ICA stenosis and                    occluded L vertebral artery Performing Technologist: Oliver Hum RVT  Examination Guidelines: A complete evaluation includes B-mode imaging, spectral Doppler, color Doppler, and power Doppler as needed of all accessible portions of each vessel. Bilateral testing is considered an integral part of a complete examination. Limited examinations for reoccurring indications may be performed as noted.  Right Carotid Findings: +----------+--------+-------+--------+--------------------------------+--------+             PSV cm/s EDV     Stenosis Describe                         Comments                       cm/s                                                        +----------+--------+-------+--------+--------------------------------+--------+  CCA Prox   51       7                smooth and heterogenous                    +----------+--------+-------+--------+--------------------------------+--------+  CCA Distal 40       7                smooth and heterogenous                    +----------+--------+-------+--------+--------------------------------+--------+  ICA Prox   99       25               smooth, heterogenous and  calcific                                   +----------+--------+-------+--------+--------------------------------+--------+  ICA Mid    98       22               smooth and heterogenous                     +----------+--------+-------+--------+--------------------------------+--------+  ICA Distal 82       22                                                          +----------+--------+-------+--------+--------------------------------+--------+  ECA        404      33               smooth, heterogenous and                                                         calcific                                   +----------+--------+-------+--------+--------------------------------+--------+ +----------+--------+-------+--------+-------------------+             PSV cm/s EDV cms Describe Arm Pressure (mmHG)  +----------+--------+-------+--------+-------------------+  Subclavian 122                                            +----------+--------+-------+--------+-------------------+ +---------+--------+--+--------+--+---------+  Vertebral PSV cm/s 95 EDV cm/s 20 Antegrade  +---------+--------+--+--------+--+---------+  Left Carotid Findings: +----------+--------+-------+--------+--------------------------------+--------+             PSV cm/s EDV     Stenosis Describe                         Comments                       cm/s                                                        +----------+--------+-------+--------+--------------------------------+--------+  CCA Prox   50       8                smooth and heterogenous                    +----------+--------+-------+--------+--------------------------------+--------+  CCA Distal 46       6                smooth and heterogenous                    +----------+--------+-------+--------+--------------------------------+--------+  ICA Prox   126  27               smooth, heterogenous and                                                         calcific                                   +----------+--------+-------+--------+--------------------------------+--------+  ICA Mid    110      12               smooth and heterogenous                     +----------+--------+-------+--------+--------------------------------+--------+  ICA Distal 56       14                                                tortuous  +----------+--------+-------+--------+--------------------------------+--------+  ECA        212      0                smooth, heterogenous and                                                         calcific                                   +----------+--------+-------+--------+--------------------------------+--------+ +----------+--------+--------+--------------+-------------------+  Subclavian PSV cm/s EDV cm/s Describe       Arm Pressure (mmHG)  +----------+--------+--------+--------------+-------------------+                               Antegrade flow                      +----------+--------+--------+--------------+-------------------+ +---------+--------+--------+------+  Vertebral PSV cm/s EDV cm/s Absent  +---------+--------+--------+------+  Summary: Right Carotid: Velocities in the right ICA are consistent with a 1-39% stenosis.                Unable to match values found in the CTA neck performed on                12/18/18, likely due to poor visualization of calcified arteries. Left Carotid: Velocities in the left ICA are consistent with a 1-39% stenosis.               Unable to match values found in the CTA neck performed on 12/18/18,               likely due to poor visualization of calcified arteries. Vertebrals:  Right vertebral artery demonstrates retrograde flow. Left vertebral              artery demonstrates no discernable flow. Left vertebral artery  demonstrates an occlusion. Subclavians: Normal flow hemodynamics were seen in the right subclavian artery.              Left antegrade flow. *See table(s) above for measurements and observations.  Electronically signed by Deitra Mayo MD on 12/19/2018 at 6:43:32 AM.    Final         Scheduled Meds:  aspirin  325 mg Oral Daily   atorvastatin  80 mg Oral q1800    clopidogrel  75 mg Oral Daily   enoxaparin (LOVENOX) injection  40 mg Subcutaneous Daily   insulin aspart  0-5 Units Subcutaneous QHS   insulin aspart  0-9 Units Subcutaneous TID WC   insulin glargine  15 Units Subcutaneous Daily   nicotine  21 mg Transdermal Daily   Continuous Infusions:    LOS: 3 days    Time spent: 26 minutes    Hosie Poisson, MD [facility] Pager [phone]  If 7PM-7AM, please contact night-coverage [email or web address] Password [facility] 12/20/2018, 9:28 AM

## 2018-12-20 NOTE — Progress Notes (Signed)
Physical Therapy Treatment Patient Details Name: Crystal Moore MRN: [id number] DOB: 1945/11/21 Today's Date: 12/20/2018    History of Present Illness Crystal Moore is a 73 y.o. female with medical history significant of arthritis, COPD, CVA with residual left hemiparesis, type 2 diabetes, seizures not on antiepileptics presenting to the hospital for evaluation after a fall. MRI revealed several small foci of acute/early subactue infarction with in the R ACA distribution.    PT Comments    Pt received in bed with head pressed against R rail.  She presents with a strong push to the R.  Her L arm remains flaccid as well as weakness in her L leg.  She required +2 moderate to max assistance to move to edge of bed and achieve x2 standing trials.  Pt's family is refusing any and all post acute rehab venues.  I will inform supervising PT of need for change in recommendations at this time based on d/c needs.  Will plan to increase frequency to 5x/week until d/c home.      Follow Up Recommendations  Home health PT;Supervision/Assistance - 24 hour(Max out [facility] services, HH PT, OT, aide, and RN. )     Equipment Recommendations  Wheelchair cushion (measurements PT);Wheelchair (measurements PT);3in1 (PT);Other (comment)(WC with fixed leg rests, drop arm commode and ambulance transport home. )    Recommendations for Other Services       Precautions / Restrictions Precautions Precautions: Fall Restrictions Weight Bearing Restrictions: No    Mobility  Bed Mobility Overal bed mobility: Needs Assistance       Supine to sit: Mod assist;+2 for physical assistance     General bed mobility comments: Pt with pushing strong push to R side.  Able to reach for PTA's hand with R arm to pull trunk while tech assisted to elevate trunk.  Pt required assistance to scoot to edge of bed.    Transfers Overall transfer level: Needs assistance Equipment used: 2 person hand held assist(PT utilized hand grip on  back of chair on right side to achieve standing.  ) Transfers: Sit to/from Stand Sit to Stand: Mod assist;Max assist;+2 physical assistance(Mod to lift hip and max to extend hips  into standing.  Required support of L arm and blocking of L knee.  Pt with poor ability to maintain standing and fatigues quickly.  ) Stand pivot transfers: Max assist;+2 physical assistance       General transfer comment: modA to power up, L knee blocked, max verbal directional cues for sequencing std pvt transfer to the chair, pt unable to toelrate full WBing on L LE without blocking it, dependent for advancement of L LE  Ambulation/Gait             General Gait Details: unable today   Stairs             Wheelchair Mobility    Modified Rankin (Stroke Patients Only) Modified Rankin (Stroke Patients Only) Pre-Morbid Rankin Score: Moderate disability Modified Rankin: Severe disability     Balance Overall balance assessment: Needs assistance Sitting-balance support: Feet supported;No upper extremity supported Sitting balance-Leahy Scale: Fair       Standing balance-Leahy Scale: Zero Standing balance comment: dependent on physical assist                            Cognition Arousal/Alertness: Awake/alert Behavior During Therapy: Flat affect Overall Cognitive Status: Impaired/Different from baseline  Exercises      General Comments        Pertinent Vitals/Pain Pain Assessment: Faces Faces Pain Scale: Hurts little more Pain Location: LUE with movement especially in her hand.   Pain Descriptors / Indicators: Tightness Pain Intervention(s): Monitored during session;Repositioned    Home Living                      Prior Function            PT Goals (current goals can now be found in the care plan section) Acute Rehab PT Goals Patient Stated Goal: didn't state Potential to Achieve Goals:  Good Progress towards PT goals: Progressing toward goals    Frequency    Min 5X/week      PT Plan Discharge plan needs to be updated    Co-evaluation              AM-PAC PT "6 Clicks" Mobility   Outcome Measure  Help needed turning from your back to your side while in a flat bed without using bedrails?: A Lot Help needed moving from lying on your back to sitting on the side of a flat bed without using bedrails?: A Lot Help needed moving to and from a bed to a chair (including a wheelchair)?: Total Help needed standing up from a chair using your arms (e.g., wheelchair or bedside chair)?: Total Help needed to walk in hospital room?: Total Help needed climbing 3-5 steps with a railing? : Total 6 Click Score: 8    End of Session Equipment Utilized During Treatment: Gait belt Activity Tolerance: Patient limited by lethargy;Patient limited by fatigue Patient left: in chair;with call bell/phone within reach(alarm pad under patient in chair. PT dovetailed session so patient left with OT post session.    ) Nurse Communication: Mobility status;Need for lift equipment PT Visit Diagnosis: Unsteadiness on feet (R26.81);Muscle weakness (generalized) (M62.81);Difficulty in walking, not elsewhere classified (R26.2);Hemiplegia and hemiparesis Hemiplegia - Right/Left: Left Hemiplegia - dominant/non-dominant: Non-dominant     Time: 0881-1031 PT Time Calculation (min) (ACUTE ONLY): 24 min  Charges:  $Therapeutic Activity: 23-37 mins                     Governor Rooks, PTA Acute Rehabilitation Services Pager [phone] Office [phone]     Jilliann Subramanian Eli Hose 12/20/2018, 4:35 PM

## 2018-12-20 NOTE — Care Management Important Message (Signed)
Important Message  Patient Details  Name: Crystal Moore MRN: [id number] Date of Birth: 16-Apr-1946   Medicare Important Message Given:  Yes    [name] 12/20/2018, 3:02 PM

## 2018-12-20 NOTE — Progress Notes (Signed)
Inpatient Rehab Admissions:  Inpatient Rehab Consult received.  I met with patient at the bedside for rehabilitation assessment and to discuss goals and expectations of an inpatient rehab admission.  She is non-verbal and unable to participate in any meaningful discussion regarding rehab needs.  I was able to speak with her daughter, [name], who is adamant that pt not be transferred to any facility, even though our unit is in the hospital building.  She states that she and her family do not want their mother in any facility for rehab while the coronavirus is still active.  She wishes for her mother to come home with home health therapies.  Will likely need extensive care and DME for safe discharge home. CIR will sign off.  CM to follow up.   Signed: Shann Medal, PT, DPT Admissions Coordinator [phone] 12/20/18  11:52 AM

## 2018-12-20 NOTE — Progress Notes (Signed)
Occupational Therapy Treatment Patient Details Name: Crystal Moore MRN: [id number] DOB: [birth year] Today's Date: 12/20/2018    History of present illness Crystal Moore is a 73 y.o. female with medical history significant of arthritis, COPD, CVA with residual left hemiparesis, type 2 diabetes, seizures not on antiepileptics presenting to the hospital for evaluation after a fall. MRI revealed several small foci of acute/early subactue infarction with in the R ACA distribution.   OT comments  Patient engaged in self care session, handoff from PT.  Patient able to engage in grooming tasks with min assist, bathing UEs with mod assist and applying lotion to B UEs/LEs with min assist overall.  Able to maintain seated balance with min guard, correcting posture with multimodal cueing (tends to push to R) given increased time.  DC plan updated to [facility] services, as family adamant about dc home. Will continue to follow.    Follow Up Recommendations  Home health OT;Supervision/Assistance - 24 hour(maximal services ([facility] OT, PT, aide, RN))    Equipment Recommendations  3 in 1 bedside commode(drop arm )    Recommendations for Other Services      Precautions / Restrictions Precautions Precautions: Fall Restrictions Weight Bearing Restrictions: No       Mobility Bed Mobility Overal bed mobility: Needs Assistance       Supine to sit: Mod assist;+2 for physical assistance     General bed mobility comments: OOB upon entry   Transfers Overall transfer level: Needs assistance Equipment used: 2 person hand held assist(PT utilized hand grip on back of chair on right side to achieve standing.  ) Transfers: Sit to/from Stand Sit to Stand: Mod assist;Max assist;+2 physical assistance(Mod to lift hip and max to extend hips  into standing.  Required support of L arm and blocking of L knee.  Pt with poor ability to maintain standing and fatigues quickly.  ) Stand pivot transfers: Max assist;+2 physical  assistance       General transfer comment: OOB in recliner     Balance Overall balance assessment: Needs assistance Sitting-balance support: Feet supported;No upper extremity supported Sitting balance-Leahy Scale: Fair Sitting balance - Comments: able to correct posture with multimodal cueing to midline, pushes to R at times; able to foward lean during self care with min guard      Standing balance-Leahy Scale: Zero Standing balance comment: dependent on physical assist                           ADL either performed or assessed with clinical judgement   ADL Overall ADL's : Needs assistance/impaired     Grooming: Minimal assistance;Sitting;Wash/dry hands;Wash/dry face Grooming Details (indicate cue type and reason): washing hands/applying lotion with min assist for thoroughness to L UE and R hand; setup washing face Upper Body Bathing: Moderate assistance;Sitting Upper Body Bathing Details (indicate cue type and reason): washing arms, assist with R UE; verbal cueing for thoroughness to LUE (applied lotion as well)   Lower Body Bathing Details (indicate cue type and reason): applied lotion to B LEs with min assist, foward lean for balance and coordination- using R UE only; min guard to maintain balance                        General ADL Comments: seated in recliner upon entry      [place]  Cognition Arousal/Alertness: Awake/alert Behavior During Therapy: Flat affect Overall Cognitive Status: No family/caregiver present to determine baseline cognitive functioning Area of Impairment: Following commands;Awareness;Problem solving;Memory;Attention                   Current Attention Level: Sustained Memory: Decreased recall of precautions;Decreased short-term memory Following Commands: Follows one step commands consistently;Follows one step commands with increased time   Awareness: Emergent Problem Solving: Slow  processing;Decreased initiation;Difficulty sequencing;Requires verbal cues General Comments: pt able to follow 1 step commands with increased time         Exercises Exercises: Other exercises Other Exercises Other Exercises: PROM of L UE from shoulder to hand   Shoulder Instructions       General Comments      Pertinent Vitals/ Pain       Pain Assessment: Faces Faces Pain Scale: No hurt Pain Location: LUE with movement especially in her hand.   Pain Descriptors / Indicators: Tightness Pain Intervention(s): Monitored during session;Repositioned  Home Living                                          Prior Functioning/Environment              Frequency  Min 3X/week        Progress Toward Goals  OT Goals(current goals can now be found in the care plan section)  Progress towards OT goals: Progressing toward goals  Acute Rehab OT Goals Patient Stated Goal: none stated  Plan Frequency remains appropriate;Discharge plan needs to be updated    Co-evaluation                 AM-PAC OT "6 Clicks" Daily Activity     Outcome Measure   Help from another person eating meals?: A Little Help from another person taking care of personal grooming?: A Little Help from another person toileting, which includes using toliet, bedpan, or urinal?: Total Help from another person bathing (including washing, rinsing, drying)?: A Lot Help from another person to put on and taking off regular upper body clothing?: A Lot Help from another person to put on and taking off regular lower body clothing?: Total 6 Click Score: 12    End of Session    OT Visit Diagnosis: Unsteadiness on feet (R26.81);Muscle weakness (generalized) (M62.81);Cognitive communication deficit (R41.841);Hemiplegia and hemiparesis Symptoms and signs involving cognitive functions: Cerebral infarction Hemiplegia - Right/Left: Left Hemiplegia - dominant/non-dominant: Non-Dominant Hemiplegia -  caused by: Cerebral infarction   Activity Tolerance Patient tolerated treatment well   Patient Left in chair;with call bell/phone within reach;with chair alarm set   Nurse Communication Mobility status        Time: 0630-1601 OT Time Calculation (min): 18 min  Charges: OT General Charges $OT Visit: 1 Visit OT Treatments $Self Care/Home Management : 8-22 mins  Delight Stare, [place] Pager [phone] Office [phone]    Delight Stare 12/20/2018, 4:59 PM

## 2018-12-20 NOTE — Progress Notes (Signed)
Orthopedic Tech Progress Note Patient Details:  Crystal Moore February 21, 1946 [id number]  Ortho Devices Type of Ortho Device: Knee Immobilizer   Post Interventions Patient Tolerated: Well Instructions Provided: Care of device   [name] 12/20/2018, 5:08 PM

## 2018-12-20 NOTE — Progress Notes (Signed)
  Speech Language Pathology Treatment: Cognitive-Linquistic  Patient Details Name: RAIVYN KABLER MRN: [id number] DOB: 01/19/1946 Today's Date: 12/20/2018 Time: 4098-1191 SLP Time Calculation (min) (ACUTE ONLY): 20 min  Assessment / Plan / Recommendation Clinical Impression  Pt was seen for cognitive-lingusistic treatment and was cooperative throughout the session but tasks needed to be modified from more structured forms. She was able to recall some activities from the day but additional processing time was required. She was oriented to person and place but not time. With cues she was able to provide the accurate month and date but was insistent that yesterday was Sunday and not Monday. She required moderate support for problem solving related to safety and to complete complex sequencing. SLP will continue to follow pt.    HPI HPI: Ms Bobbiejo Ishikawa with a h/o CVA and residual left sided hemiparesis presented to ED after a fall. CT negative for acute abnormalities, however MRI found several small foci of acute/early subacute infarction within theright ACA distribution. No gross hemorrhage or mass effect. Daughter reported her speech and language were effected by her last stroke, but had full recovery after speech and language therapy. Pt passed the [place] and is tolerating a regular diet with thin liquids.       SLP Plan  Continue with current plan of care       Recommendations                   Follow up Recommendations: Home health SLP;24 hour supervision/assistance SLP Visit Diagnosis: Cognitive communication deficit (Y78.295) Plan: Continue with current plan of care       Garrett Bowring I. Hardin Negus, [place], [place] Office number [phone] Pager [phone]                Horton Marshall 12/20/2018, 5:30 PM

## 2018-12-21 DIAGNOSIS — E876 Hypokalemia: Secondary | ICD-10-CM

## 2018-12-21 DIAGNOSIS — E1169 Type 2 diabetes mellitus with other specified complication: Secondary | ICD-10-CM

## 2018-12-21 DIAGNOSIS — Z720 Tobacco use: Secondary | ICD-10-CM

## 2018-12-21 LAB — GLUCOSE, CAPILLARY
Glucose-Capillary: 124 mg/dL — ABNORMAL HIGH (ref 70–99)
Glucose-Capillary: 193 mg/dL — ABNORMAL HIGH (ref 70–99)

## 2018-12-21 MED ORDER — ASPIRIN EC 81 MG PO TBEC: 81.0000 mg | DELAYED_RELEASE_TABLET | Freq: Every day | ORAL | 2 refills | Status: AC

## 2018-12-21 MED ORDER — CLOPIDOGREL BISULFATE 75 MG PO TABS: 75.0000 mg | ORAL_TABLET | Freq: Every day | ORAL | 0 refills | Status: DC

## 2018-12-21 MED ORDER — ATORVASTATIN CALCIUM 80 MG PO TABS: 80.0000 mg | ORAL_TABLET | Freq: Every day | ORAL | 0 refills | Status: AC

## 2018-12-21 NOTE — Progress Notes (Signed)
    Durable Medical Equipment  (From admission, onward)         Start     Ordered   12/21/18 1003  For home use only DME lightweight manual wheelchair with seat cushion  Once    Comments:  Patient suffers from stroke which impairs their ability to perform daily activities like ambulating in the home.  A walker will not resolve  issue with performing activities of daily living. A wheelchair will allow patient to safely perform daily activities. Patient is not able to propel themselves in the home using a standard weight wheelchair due to weakness. Patient can self propel in the lightweight wheelchair. Length of need lifetime. Accessories: elevating leg rests (ELRs), wheel locks, extensions and anti-tippers.   12/21/18 1004   12/21/18 1002  For home use only DME 3 n 1  Once    Comments:  With a drop arm   12/21/18 1004

## 2018-12-21 NOTE — TOC Transition Note (Signed)
Transition of Care [facility]) - CM/SW Discharge Note   Patient Details  Name: Crystal Moore MRN: [id number] Date of Birth: [birth year]  [facility] CM/SW Contact:  Pollie Friar, RN Phone Number: 12/21/2018, 11:48 AM   Clinical Narrative:    New DME added to orders for home. Daughter insisting on providing transportation home. She will bring her son to assist. Bedside RN aware of 3 pm pickup time.    Final next level of care: [place] Barriers to Discharge: Barriers Resolved   Patient Goals and CMS Choice Patient states their goals for this hospitalization and ongoing recovery are:: to get home CMS Medicare.gov Compare Post Acute Care list provided to:: Patient Represenative (must comment)(daughter) Choice offered to / list presented to : Adult Children  Discharge Placement                       Discharge Plan and Services   Discharge Planning Services: CM Consult Post Acute Care Choice: Home Health, Durable Medical Equipment          DME Arranged: Hospital bed, Other see comment(hoyer lift) DME Agency: ([employer]) Date DME Agency Contacted: 12/21/18 Time DME Agency Contacted: 3 Representative spoke with at DME Agency: Je[facility] Hawking HH Arranged: RN, PT, OT, Nurse's Aide, Speech Therapy, Social Work [facility] Agency: [facility] Date [place]: 12/21/18 Time [place]: 1100 Representative spoke with at [place]: [place] Interventions     Readmission Risk Interventions No flowsheet data found.

## 2018-12-21 NOTE — Discharge Instructions (Signed)
Local Endocrinologists [facility] [phone]) 1. Dr. Philemon Kingdom 2. Dr. Janie Morning [facility] [phone]) 1. Dr. Delrae Rend [facility] [phone]) 1. Dr. Jacelyn Pi 2. Dr. Anda Kraft [facility] [phone]) 1. Dr. Daneil Dolin [facility] [phone]) [[place] office]  [phone]) [[place] office] 1. Dr. Lenna Sciara [place] 2. Dr. Judithann Sheen [facility]) [phone]) 1. Autumn Hudnall Ronnald Ramp), PA 2. Dr. Amalia Greenhouse 3. Dr. Marsh Dolly. [facility] [phone]) 1. Dr. Glade Lloyd [facility] [phone]) 1. Dr. Orville Govern 2. Dr. Lelon Huh 3. Dr. Jerelene Redden 4. Alwyn Ren, FNP Dr. Carolynn Serve. [facility] in [place] [phone])  As discussed - Continue checking blood sugars 3-4 times a day.  - Focus on adding a blood sugar 2 hours following a large meal. Write in journal. - Plan to follow up with primary care in 2 weeks and report blood sugars from journal.  - Make sure to report glucoses above 200 mg/dL - Consider an appointment with endocrinology.

## 2018-12-21 NOTE — Discharge Summary (Signed)
Physician Discharge Summary  Crystal Moore YYT:[id number] DOB: 05/28/1946 DOA: 12/17/2018  PCP: [name], MD  Admit date: 12/17/2018 Discharge date: 12/21/2018  Time spent: 45 minutes  Recommendations for Outpatient Follow-up:  Patient will be discharged to home with home health, physical, occupational, speech therapy and nursing.  Patient will need to follow up with primary care provider within one week of discharge.  Follow up with neurology in 4 weeks. Patient should continue medications as prescribed.  Patient should follow a heart healthy/carb modified diet.   Discharge Diagnoses:  Acute CVA COPD Tobacco abuse Diabetes mellitus, type II Essential Hypertension Hypokalemia  Discharge Condition: Stable  Diet recommendation: Heart healthy/carb modified  Filed Weights   12/19/18 0300  Weight: 62 kg    History of present illness:  On 12/17/2018 by Dr. Regino Schultze Crystal Moore is a 73 y.o. female with medical history significant of arthritis, COPD, CVA with residual left hemiparesis, type 2 diabetes, seizures not on antiepileptics presenting to the hospital for evaluation after a fall.  Patient was oriented to person and place which appears to be her baseline per daughter at bedside.  She did not give me any additional history and denied any complaints.  Daughter states patient has had a stroke previously which left her with left-sided weakness. She has no strength in her left upper extremity since the previous stroke.  She does have weakness in her left lower extremity since the previous stroke but is still able to ambulate with a walker.  Daughter states this morning patient's granddaughter noticed that around 10 AM while trying to walk she fell because her left leg gave out and became completely limp.  Patient fell on her buttocks and did not hit her head.  Daughter states patient has otherwise been feeling well and has not been sick recently.  She has not had any  complaints of fevers, chills, chest pain, shortness of breath, nausea, vomiting, abdominal pain, or diarrhea.  States patient is a heavy smoker and has a chronic cough, no recent change.  Daughter states patient was previously on a baby aspirin but it was stopped during her previous hospitalization last year.  She is not sure why.  Patient was admitted in November 2019 and found to have findings on the CT concerning for ischemic colitis.  Aspirin was held at that time with plan to follow-up with GI and PCP after discharge to determine when aspirin can be restarted.  Hospital Course:  Acute CVA -Patient has had a history of stroke with left-sided weakness.  She presented with fall. -CT head showed no acute or cranial normality -CT cervical spine showed no fracture or acute subluxation -CTA head and neck showed right proximal ICA severe 70% stenosis secondary to calcified plaque.  Left proximal ICA moderate 60% stenosis secondary to mixed plaque.  Occluded left vertebral artery.  Right vertebral artery widely patent.  Patent anterior and posterior intracranial circulation.  Moderate to severe right A2 stenosis and attenuated downstream right ACA. -MRI brain showed several small foci of acute/early subacute infarction within the right ACA distribution.  No gross hemorrhage or mass-effect. -LDL 100, hemoglobin A1c 12.3 -Echocardiogram shows an EF of 50 to 27%, LV diastolic Doppler parameters consistent with impaired relaxation. -Carotid Doppler: Bilateral 1-39% ICA stenosis.  Right vertebral artery demonstrates retrograde flow.  Left vertebral artery demonstrates no discernible flow, occlusion. -Patient placed on statin, Plavix, aspirin- discussed with Dr. Leonie Man, 75mg  Plavix, 81mg  ASA -PT and OT recommending inpatient rehab.  However  family has refused. -Discharge patient with home health services.  Case management consulted.  PT recommending wheelchair with cushion, 3 and 1, hospital bed, Hoyer lift,  ABC with fixed leg rests.  Drop arm commode. -Neurology consulted and appreciated  COPD -stable, no wheezing on exam -Chest x-ray unremarkable for infection  Tobacco abuse -tobacco cessation counseling  -Continue nicotine patch  Diabetes mellitus, type II -hemolobin A1c 12.3 -Continue Lantus, insulin sliding scale -Needs to follow-up with her primary care doctor for better blood glucose control  Essential Hypertension -Stable -given Acute CVA, allow for permissive hypertension  Hypokalemia -Resolved with replacement -repeat BMP in one week  Procedures: Echocardiogram Carotid doppler  Consultations: Neurology Vascular surgery Inpatient rehab  Discharge Exam: Vitals:   12/21/18 0802 12/21/18 1146  BP: (!) 145/68 120/79  Pulse: 70 87  Resp: 16 16  Temp: 97.7 F (36.5 C) 99 F (37.2 C)  SpO2: 95% 95%     General: Well developed, elderly, NAD  HEENT: NCAT, mucous membranes moist.  Cardiovascular: S1 S2 auscultated, no murmur, RRR  Respiratory: Clear to auscultation bilaterally with equal chest rise  Abdomen: Soft, nontender, nondistended, + bowel sounds  Extremities: warm dry without cyanosis clubbing  Neuro: AAOx2,  Generally weak  Psych: Pleasant, appropriate mood and affect  Discharge Instructions Discharge Instructions    DME Bedside commode   Complete by:  As directed    Patient needs a bedside commode to treat with the following condition:  Acute CVA (cerebrovascular accident) [facility])   [facility] bed   Complete by:  As directed    Length of Need:  6 Months   Bed type:  Semi-electric   Hoyer Lift:  Yes   Discharge instructions   Complete by:  As directed    Patient will be discharged to home with home health, physical, occupational, speech therapy and nursing.  Patient will need to follow up with primary care provider within one week of discharge.  Follow up with neurology in 4 weeks. Patient should continue medications as prescribed.   Patient should follow a heart healthy/carb modified diet.   Discharge instructions   Complete by:  As directed    Patient will be discharged to home with home health, physical, occupational, speech therapy and nursing.  Patient will need to follow up with primary care provider within one week of discharge.  Follow up with neurology in 4 weeks. Patient should continue medications as prescribed.  Patient should follow a heart healthy/carb modified diet.     Allergies as of 12/21/2018   No Known Allergies     Medication List    STOP taking these medications   albuterol 108 (90 Base) MCG/ACT inhaler Commonly known as:  VENTOLIN HFA   feeding supplement (ENSURE ENLIVE) Liqd   Fluticasone-Umeclidin-Vilant 100-62.5-25 MCG/INH Aepb Commonly known as:  Trelegy Ellipta   insulin degludec 100 UNIT/ML Sopn FlexTouch Pen Commonly known as:  Tyler Aas FlexTouch   simvastatin 40 MG tablet Commonly known as:  ZOCOR     TAKE these medications   amLODipine 5 MG tablet Commonly known as:  NORVASC Take 5 mg by mouth daily.   aspirin EC 81 MG tablet Take 1 tablet (81 mg total) by mouth daily.   atorvastatin 80 MG tablet Commonly known as:  LIPITOR Take 1 tablet (80 mg total) by mouth daily at 6 PM.   clopidogrel 75 MG tablet Commonly known as:  PLAVIX Take 1 tablet (75 mg total) by mouth daily. Start taking on:  Dec 22, 2018   Lantus 100 UNIT/ML injection Generic drug:  insulin glargine Take 15 Units by mouth daily. Notes to patient:  Continue home schedule    metFORMIN 500 MG tablet Commonly known as:  GLUCOPHAGE Take 1,000 mg by mouth 2 (two) times a day.   nicotine 10 MG inhaler Commonly known as:  Nicotrol Inhale 1 Cartridge (1 continuous puffing total) into the lungs as needed for smoking cessation.            Durable Medical Equipment  (From admission, onward)         Start     Ordered   12/21/18 1118  DME tub bench  Once     12/21/18 1117   12/21/18 1117  For  home use only DME lightweight manual wheelchair with seat cushion  (Wheelchairs)  Once    Comments:  Patient suffers from acute stroke which impairs their ability to perform daily activities in the home.  A walker will not resolve  issue with performing activities of daily living. A wheelchair will allow patient to safely perform daily activities. Patient is not able to propel themselves in the home using a standard weight wheelchair due to acute CVA and weakness Patient can self propel in the lightweight wheelchair. Accessories: elevating leg rests (ELRs), wheel locks, extensions and anti-tippers.   12/21/18 1117   12/21/18 1117  For home use only DME wheelchair cushion (seat and back)  (Wheelchairs)  Once     12/21/18 1117   12/21/18 1116  DME 3-in-1  Once     12/21/18 1117   12/21/18 1003  For home use only DME lightweight manual wheelchair with seat cushion  Once    Comments:  Patient suffers from stroke which impairs their ability to perform daily activities like ambulating in the home.  A walker will not resolve  issue with performing activities of daily living. A wheelchair will allow patient to safely perform daily activities. Patient is not able to propel themselves in the home using a standard weight wheelchair due to weakness. Patient can self propel in the lightweight wheelchair. Length of need lifetime. Accessories: elevating leg rests (ELRs), wheel locks, extensions and anti-tippers.   12/21/18 1004   12/21/18 1002  For home use only DME 3 n 1  Once    Comments:  With a drop arm   12/21/18 1004   12/21/18 0000  DME Hospital bed    Question Answer Comment  Length of Need 6 Months   Bed type Semi-electric   Hoyer Lift Yes      12/21/18 1117   12/21/18 0000  DME Bedside commode    Question:  Patient needs a bedside commode to treat with the following condition  Answer:  Acute CVA (cerebrovascular accident) ([place])   12/21/18 1117         No Known Allergies Follow-up  Information    [facility] Follow up.   Specialty:  Home Health Services Why:  They will contact you for the first appt. Contact information: [address] [place] [address] [phone]        [name], MD. Schedule an appointment as soon as possible for a visit in 1 week(s).   Specialty:  Internal Medicine Why:  Hospital follow up Contact information: [address] [address] [place] [address] [phone]        [facility]. Schedule an appointment as soon as possible for a visit in 4  week(s).   Specialty:  Neurology Why:  hospital follow up Contact information: [address] [place] [phone]           The results of significant diagnostics from this hospitalization (including imaging, microbiology, ancillary and laboratory) are listed below for reference.    Significant Diagnostic Studies: Ct Angio Head W Or Wo Contrast  Result Date: 12/18/2018 CLINICAL DATA:  73 y/o  F; stroke for follow-up. EXAM: CT ANGIOGRAPHY HEAD AND NECK TECHNIQUE: Multidetector CT imaging of the head and neck was performed using the standard protocol during bolus administration of intravenous contrast. Multiplanar CT image reconstructions and MIPs were obtained to evaluate the vascular anatomy. Carotid stenosis measurements (when applicable) are obtained utilizing NASCET criteria, using the distal internal carotid diameter as the denominator. CONTRAST:  73mL OMNIPAQUE IOHEXOL 350 MG/ML SOLN COMPARISON:  12/17/2018 MRI of the head. FINDINGS: CTA NECK FINDINGS Aortic arch: Bovine variant branching. Imaged portion shows no evidence of aneurysm or dissection. No significant stenosis of the major arch vessel origins. Atherosclerosis of the aortic arch. Right carotid system: Dense calcified plaque of the right carotid bifurcation with severe 70% proximal ICA stenosis. Severe stenosis origin of right ECA.  Left carotid system: Mixed plaque of the left carotid bifurcation with moderate 60% proximal ICA stenosis. Severe stenosis of the origin of right ECA. Vertebral arteries: Left V1 and V2 are occluded to the C4-5 level. The vessel cranial to the C4-5 level is irregular and attenuated. The right dominant vertebral artery is widely patent without high-grade stenosis, dissection, or aneurysm. Skeleton: No acute finding. Cervical spondylosis with predominant discogenic degenerative changes, greatest at the C4-C6 levels. At C4-C6 there are endplate marginal osteophytes and disc protrusions resulting in approximately moderate spinal canal stenosis, possible underlying cord impingement. Other neck: 14 mm nodule in the right lobe of the thyroid gland. Upper chest: Fibrosis and emphysema in the lung apices. Review of the MIP images confirms the above findings CTA HEAD FINDINGS Anterior circulation: Calcified plaque of the right carotid siphon with moderate paraclinoid stenosis. Calcified plaque of the left horizontal petrous segment of ICA with mild stenosis and moderate left paraclinoid ICA stenosis. Moderate to severe stenosis of the right A2 with attenuation of the downstream right ACA (series 11, image 16). Mild right distal M1 stenosis. Otherwise no proximal occlusion, aneurysm, or vascular malformation. Posterior circulation: Tandem segments of mild-to-moderate stenosis of the left intracranial vertebral artery. Widely patent right vertebral artery. Mild right P2 stenosis. Moderate left P1 and severe left P2 stenosis. Otherwise no proximal occlusion, aneurysm, or vascular malformation. Venous sinuses: As permitted by contrast timing, patent. Anatomic variants: None significant. Delayed phase: No abnormal intracranial enhancement. Review of the MIP images confirms the above findings IMPRESSION: CTA neck bone 1. Right proximal ICA severe 70% stenosis stenosis secondary to calcified plaque. 2. Left proximal ICA moderate 60%  stenosis secondary to mixed plaque. 3. Occluded left vertebral artery from origin to C4-5 level. Patent vessel above the C4-5 level, probably retrograde flow. 4. Right vertebral artery widely patent without high-grade stenosis, dissection, or aneurysm. 5. Cervical spondylosis greatest at C4-C6 levels with there is approximately moderate spinal canal stenosis, prominent disc displacements, and possible cord impingement. CTA HEAD:. 1. Patent anterior and posterior intracranial circulation. No proximal large vessel occlusion, aneurysm, or vascular malformation identified. 2. Moderate to severe right A2 stenosis and attenuated downstream right ACA. 3. Intracranial atherosclerosis with right greater than left ICA moderate paraclinoid stenosis, moderate left P1 and severe left  P2 stenosis, as well as multiple segments of mild stenosis throughout the anterior and posterior circulations. Electronically Signed   By: Kristine Garbe M.D.   On: 12/18/2018 04:08   Ct Head Wo Contrast  Result Date: 12/17/2018 CLINICAL DATA:  Found down after possible fall this morning. Previous strokes. EXAM: CT HEAD WITHOUT CONTRAST CT CERVICAL SPINE WITHOUT CONTRAST TECHNIQUE: Multidetector CT imaging of the head and cervical spine was performed following the standard protocol without intravenous contrast. Multiplanar CT image reconstructions of the cervical spine were also generated. COMPARISON:  None. FINDINGS: CT HEAD FINDINGS Brain: Moderate ventriculomegaly related to generalized parenchymal volume loss. Chronic small vessel ischemic changes are seen throughout the bilateral periventricular and subcortical white matter regions. Chronic lacunar infarcts are present within the bilateral basal ganglia regions, RIGHT greater than LEFT. Small old infarct also noted within the RIGHT cerebellum. There is no mass, hemorrhage, mass effect or other signs of acute parenchymal abnormality. Vascular: Chronic calcified atherosclerotic  changes of the large vessels at the skull base. No unexpected hyperdense vessel. Skull: Normal. Negative for fracture or focal lesion. Sinuses/Orbits: No acute finding. Other: None. CT CERVICAL SPINE FINDINGS Alignment: No evidence of acute vertebral body subluxation. Skull base and vertebrae: No fracture line or displaced fracture fragment appreciated. Facet joints appear normally aligned throughout. Soft tissues and spinal canal: No prevertebral fluid or swelling. No visible canal hematoma. Disc levels: Degenerative changes of the mid and lower cervical spine, mild to moderate in degree with associated disc space narrowings and osseous spurring. Associated disc-osteophytic bulge at the C5-6 level causing moderate central canal stenosis with possible mass effect on the anterior portion of the cervical cord. No more than mild central canal stenosis at the remainder of the cervical levels. Upper chest: No acute findings. Other: Bilateral carotid atherosclerosis. IMPRESSION: 1. No acute intracranial abnormality. No intracranial mass, hemorrhage or edema. No skull fracture. Atrophy and chronic ischemic changes, as detailed above. 2. No fracture or acute subluxation within the cervical spine. Degenerative changes of the cervical spine, mild to moderate in degree, as detailed above. 3. Carotid atherosclerosis. Electronically Signed   By: Franki Cabot M.D.   On: 12/17/2018 18:08   Ct Angio Neck W Or Wo Contrast  Result Date: 12/18/2018 CLINICAL DATA:  73 y/o  F; stroke for follow-up. EXAM: CT ANGIOGRAPHY HEAD AND NECK TECHNIQUE: Multidetector CT imaging of the head and neck was performed using the standard protocol during bolus administration of intravenous contrast. Multiplanar CT image reconstructions and MIPs were obtained to evaluate the vascular anatomy. Carotid stenosis measurements (when applicable) are obtained utilizing NASCET criteria, using the distal internal carotid diameter as the denominator. CONTRAST:   45mL OMNIPAQUE IOHEXOL 350 MG/ML SOLN COMPARISON:  12/17/2018 MRI of the head. FINDINGS: CTA NECK FINDINGS Aortic arch: Bovine variant branching. Imaged portion shows no evidence of aneurysm or dissection. No significant stenosis of the major arch vessel origins. Atherosclerosis of the aortic arch. Right carotid system: Dense calcified plaque of the right carotid bifurcation with severe 70% proximal ICA stenosis. Severe stenosis origin of right ECA. Left carotid system: Mixed plaque of the left carotid bifurcation with moderate 60% proximal ICA stenosis. Severe stenosis of the origin of right ECA. Vertebral arteries: Left V1 and V2 are occluded to the C4-5 level. The vessel cranial to the C4-5 level is irregular and attenuated. The right dominant vertebral artery is widely patent without high-grade stenosis, dissection, or aneurysm. Skeleton: No acute finding. Cervical spondylosis with predominant discogenic degenerative changes, greatest at  the C4-C6 levels. At C4-C6 there are endplate marginal osteophytes and disc protrusions resulting in approximately moderate spinal canal stenosis, possible underlying cord impingement. Other neck: 14 mm nodule in the right lobe of the thyroid gland. Upper chest: Fibrosis and emphysema in the lung apices. Review of the MIP images confirms the above findings CTA HEAD FINDINGS Anterior circulation: Calcified plaque of the right carotid siphon with moderate paraclinoid stenosis. Calcified plaque of the left horizontal petrous segment of ICA with mild stenosis and moderate left paraclinoid ICA stenosis. Moderate to severe stenosis of the right A2 with attenuation of the downstream right ACA (series 11, image 16). Mild right distal M1 stenosis. Otherwise no proximal occlusion, aneurysm, or vascular malformation. Posterior circulation: Tandem segments of mild-to-moderate stenosis of the left intracranial vertebral artery. Widely patent right vertebral artery. Mild right P2 stenosis.  Moderate left P1 and severe left P2 stenosis. Otherwise no proximal occlusion, aneurysm, or vascular malformation. Venous sinuses: As permitted by contrast timing, patent. Anatomic variants: None significant. Delayed phase: No abnormal intracranial enhancement. Review of the MIP images confirms the above findings IMPRESSION: CTA neck bone 1. Right proximal ICA severe 70% stenosis stenosis secondary to calcified plaque. 2. Left proximal ICA moderate 60% stenosis secondary to mixed plaque. 3. Occluded left vertebral artery from origin to C4-5 level. Patent vessel above the C4-5 level, probably retrograde flow. 4. Right vertebral artery widely patent without high-grade stenosis, dissection, or aneurysm. 5. Cervical spondylosis greatest at C4-C6 levels with there is approximately moderate spinal canal stenosis, prominent disc displacements, and possible cord impingement. CTA HEAD:. 1. Patent anterior and posterior intracranial circulation. No proximal large vessel occlusion, aneurysm, or vascular malformation identified. 2. Moderate to severe right A2 stenosis and attenuated downstream right ACA. 3. Intracranial atherosclerosis with right greater than left ICA moderate paraclinoid stenosis, moderate left P1 and severe left P2 stenosis, as well as multiple segments of mild stenosis throughout the anterior and posterior circulations. Electronically Signed   By: Kristine Garbe M.D.   On: 12/18/2018 04:08   Ct Cervical Spine Wo Contrast  Result Date: 12/17/2018 CLINICAL DATA:  Found down after possible fall this morning. Previous strokes. EXAM: CT HEAD WITHOUT CONTRAST CT CERVICAL SPINE WITHOUT CONTRAST TECHNIQUE: Multidetector CT imaging of the head and cervical spine was performed following the standard protocol without intravenous contrast. Multiplanar CT image reconstructions of the cervical spine were also generated. COMPARISON:  None. FINDINGS: CT HEAD FINDINGS Brain: Moderate ventriculomegaly related to  generalized parenchymal volume loss. Chronic small vessel ischemic changes are seen throughout the bilateral periventricular and subcortical white matter regions. Chronic lacunar infarcts are present within the bilateral basal ganglia regions, RIGHT greater than LEFT. Small old infarct also noted within the RIGHT cerebellum. There is no mass, hemorrhage, mass effect or other signs of acute parenchymal abnormality. Vascular: Chronic calcified atherosclerotic changes of the large vessels at the skull base. No unexpected hyperdense vessel. Skull: Normal. Negative for fracture or focal lesion. Sinuses/Orbits: No acute finding. Other: None. CT CERVICAL SPINE FINDINGS Alignment: No evidence of acute vertebral body subluxation. Skull base and vertebrae: No fracture line or displaced fracture fragment appreciated. Facet joints appear normally aligned throughout. Soft tissues and spinal canal: No prevertebral fluid or swelling. No visible canal hematoma. Disc levels: Degenerative changes of the mid and lower cervical spine, mild to moderate in degree with associated disc space narrowings and osseous spurring. Associated disc-osteophytic bulge at the C5-6 level causing moderate central canal stenosis with possible mass effect on the anterior portion  of the cervical cord. No more than mild central canal stenosis at the remainder of the cervical levels. Upper chest: No acute findings. Other: Bilateral carotid atherosclerosis. IMPRESSION: 1. No acute intracranial abnormality. No intracranial mass, hemorrhage or edema. No skull fracture. Atrophy and chronic ischemic changes, as detailed above. 2. No fracture or acute subluxation within the cervical spine. Degenerative changes of the cervical spine, mild to moderate in degree, as detailed above. 3. Carotid atherosclerosis. Electronically Signed   By: Franki Cabot M.D.   On: 12/17/2018 18:08   Mr Brain Wo Contrast  Result Date: 12/17/2018 CLINICAL DATA:  74 y/o F; found down  after possible fall. Focal neuro deficit, > 6 hrs, stroke suspected. EXAM: MRI HEAD WITHOUT CONTRAST TECHNIQUE: Axial and coronal DWI sequences were acquired. Patient was unable to continue the examination and additional sequences were not acquired. COMPARISON:  12/17/2018 CT of the head. FINDINGS: There are several small foci of reduced diffusion within the right caudate head and the right ACA distribution of medial frontal lobe compatible with acute/early subacute infarction. No associated gross hemorrhage or mass effect. Very small chronic infarction within the right cerebellum. Confluent white matter hyperintensities are compatible with advanced chronic microvascular ischemic changes and there is diffuse volume loss of the brain. No herniation. IMPRESSION: 1. DWI sequences were acquired.  Limited study. 2. Several small foci of acute/early subacute infarction within the right ACA distribution. No gross hemorrhage or mass effect. These results were called by telephone at the time of interpretation on 12/17/2018 at 10:25 pm to Dr. Trinidad Curet , who verbally acknowledged these results. Electronically Signed   By: Kristine Garbe M.D.   On: 12/17/2018 22:27   Dg Pelvis Portable  Result Date: 12/17/2018 CLINICAL DATA:  Fall. EXAM: PORTABLE PELVIS 1-2 VIEWS COMPARISON:  None. FINDINGS: The bones appear adequately mineralized. No evidence of acute fracture or dislocation. There are mild degenerative changes of the hips and sacroiliac joints. Iliofemoral atherosclerosis noted. IMPRESSION: No evidence of acute pelvic injury. Electronically Signed   By: Richardean Sale M.D.   On: 12/17/2018 17:22   Dg Chest Portable 1 View  Result Date: 12/17/2018 CLINICAL DATA:  Fall. EXAM: PORTABLE CHEST 1 VIEW COMPARISON:  Radiographs 01/12/2018.  CT 02/15/2018. FINDINGS: 1642 hours. Patient is mildly rotated to the right. The heart size and mediastinal contours are stable with aortic atherosclerosis. There is chronic  fibrotic interstitial lung disease with mild superimposed atelectasis at the right lung base. No confluent airspace opacity, pleural effusion or pneumothorax identified. There is a small calcified right lung granuloma. No evidence of acute fracture. Telemetry leads overlie the chest. IMPRESSION: No acute posttraumatic findings in the chest. Mild right lower lobe atelectasis superimposed on chronic fibrotic interstitial lung disease. Electronically Signed   By: Richardean Sale M.D.   On: 12/17/2018 17:21   Vas US Carotid  Result Date: 12/19/2018 Carotid Arterial Duplex Study Indications:       CVA. Risk Factors:      Hypertension, hyperlipidemia, Diabetes, prior CVA. Other Factors:     COPD. Limitations:       Patient movement, respiratory disturbance, patient                    positioning Comparison Study:  12/18/18 CTA neck - R 70% ICA stenosis L 60% ICA stenosis and                    occluded L vertebral artery Performing Technologist: Oliver Hum RVT  Examination Guidelines: A complete evaluation includes B-mode imaging, spectral Doppler, color Doppler, and power Doppler as needed of all accessible portions of each vessel. Bilateral testing is considered an integral part of a complete examination. Limited examinations for reoccurring indications may be performed as noted.  Right Carotid Findings: +----------+--------+-------+--------+--------------------------------+--------+             PSV cm/s EDV     Stenosis Describe                         Comments                       cm/s                                                        +----------+--------+-------+--------+--------------------------------+--------+  CCA Prox   51       7                smooth and heterogenous                    +----------+--------+-------+--------+--------------------------------+--------+  CCA Distal 40       7                smooth and heterogenous                     +----------+--------+-------+--------+--------------------------------+--------+  ICA Prox   99       25               smooth, heterogenous and                                                         calcific                                   +----------+--------+-------+--------+--------------------------------+--------+  ICA Mid    98       22               smooth and heterogenous                    +----------+--------+-------+--------+--------------------------------+--------+  ICA Distal 82       22                                                          +----------+--------+-------+--------+--------------------------------+--------+  ECA        404      33               smooth, heterogenous and  calcific                                   +----------+--------+-------+--------+--------------------------------+--------+ +----------+--------+-------+--------+-------------------+             PSV cm/s EDV cms Describe Arm Pressure (mmHG)  +----------+--------+-------+--------+-------------------+  Subclavian 122                                            +----------+--------+-------+--------+-------------------+ +---------+--------+--+--------+--+---------+  Vertebral PSV cm/s 95 EDV cm/s 20 Antegrade  +---------+--------+--+--------+--+---------+  Left Carotid Findings: +----------+--------+-------+--------+--------------------------------+--------+             PSV cm/s EDV     Stenosis Describe                         Comments                       cm/s                                                        +----------+--------+-------+--------+--------------------------------+--------+  CCA Prox   50       8                smooth and heterogenous                    +----------+--------+-------+--------+--------------------------------+--------+  CCA Distal 46       6                smooth and heterogenous                     +----------+--------+-------+--------+--------------------------------+--------+  ICA Prox   126      27               smooth, heterogenous and                                                         calcific                                   +----------+--------+-------+--------+--------------------------------+--------+  ICA Mid    110      12               smooth and heterogenous                    +----------+--------+-------+--------+--------------------------------+--------+  ICA Distal 56       14                                                tortuous  +----------+--------+-------+--------+--------------------------------+--------+  ECA        212      0  smooth, heterogenous and                                                         calcific                                   +----------+--------+-------+--------+--------------------------------+--------+ +----------+--------+--------+--------------+-------------------+  Subclavian PSV cm/s EDV cm/s Describe       Arm Pressure (mmHG)  +----------+--------+--------+--------------+-------------------+                               Antegrade flow                      +----------+--------+--------+--------------+-------------------+ +---------+--------+--------+------+  Vertebral PSV cm/s EDV cm/s Absent  +---------+--------+--------+------+  Summary: Right Carotid: Velocities in the right ICA are consistent with a 1-39% stenosis.                Unable to match values found in the CTA neck performed on                12/18/18, likely due to poor visualization of calcified arteries. Left Carotid: Velocities in the left ICA are consistent with a 1-39% stenosis.               Unable to match values found in the CTA neck performed on 12/18/18,               likely due to poor visualization of calcified arteries. Vertebrals:  Right vertebral artery demonstrates retrograde flow. Left vertebral              artery demonstrates no discernable flow. Left  vertebral artery              demonstrates an occlusion. Subclavians: Normal flow hemodynamics were seen in the right subclavian artery.              Left antegrade flow. *See table(s) above for measurements and observations.  Electronically signed by Deitra Mayo MD on 12/19/2018 at 6:43:32 AM.    Final     Microbiology: Recent Results (from the past 240 hour(s))  SARS Coronavirus 2 (CEPHEID - Performed in [facility] hospital lab), Hosp Order     Status: None   Collection Time: 12/17/18  5:07 PM  Result Value Ref Range Status   SARS Coronavirus 2 NEGATIVE NEGATIVE Final    Comment: (NOTE) If result is NEGATIVE SARS-CoV-2 target nucleic acids are NOT DETECTED. The SARS-CoV-2 RNA is generally detectable in upper and lower  respiratory specimens during the acute phase of infection. The lowest  concentration of SARS-CoV-2 viral copies this assay can detect is 250  copies / mL. A negative result does not preclude SARS-CoV-2 infection  and should not be used as the sole basis for treatment or other  patient management decisions.  A negative result may occur with  improper specimen collection / handling, submission of specimen other  than nasopharyngeal swab, presence of viral mutation(s) within the  areas targeted by this assay, and inadequate number of viral copies  (<250 copies / mL). A negative result must be combined with clinical  observations, patient history, and epidemiological information. If result is POSITIVE SARS-CoV-2 target  nucleic acids are DETECTED. The SARS-CoV-2 RNA is generally detectable in upper and lower  respiratory specimens dur ing the acute phase of infection.  Positive  results are indicative of active infection with SARS-CoV-2.  Clinical  correlation with patient history and other diagnostic information is  necessary to determine patient infection status.  Positive results do  not rule out bacterial infection or co-infection with other viruses. If result  is PRESUMPTIVE POSTIVE SARS-CoV-2 nucleic acids MAY BE PRESENT.   A presumptive positive result was obtained on the submitted specimen  and confirmed on repeat testing.  While 2019 novel coronavirus  (SARS-CoV-2) nucleic acids may be present in the submitted sample  additional confirmatory testing may be necessary for epidemiological  and / or clinical management purposes  to differentiate between  SARS-CoV-2 and other Sarbecovirus currently known to infect humans.  If clinically indicated additional testing with an alternate test  methodology [phone]) is advised. The SARS-CoV-2 RNA is generally  detectable in upper and lower respiratory sp ecimens during the acute  phase of infection. The expected result is Negative. Fact Sheet for Patients:  StrictlyIdeas.no Fact Sheet for Healthcare Providers: BankingDealers.co.za This test is not yet approved or cleared by the [place] FDA and has been authorized for detection and/or diagnosis of SARS-CoV-2 by FDA under an Emergency Use Authorization (EUA).  This EUA will remain in effect (meaning this test can be used) for the duration of the COVID-19 declaration under Section 564(b)(1) of the Act, 21 U.S.C. section 360bbb-3(b)(1), unless the authorization is terminated or revoked sooner. Performed at [facility], [place] [address], [place], [place] [address]      Labs: Basic Metabolic Panel: Recent Labs  Lab 12/17/18 1735 12/18/18 0355 12/20/18 0952  NA 134* 134* 138  K 3.4* 3.8 4.0  CL 97* 100 102  CO2 24 23 25   GLUCOSE 173* 229* 153*  BUN 9 7* 9  CREATININE 0.64 0.69 0.68  CALCIUM 8.9 8.6* 9.4  MG 1.9  --   --    Liver Function Tests: Recent Labs  Lab 12/17/18 1735  AST 19  ALT 16  ALKPHOS 86  BILITOT 0.5  PROT 6.7  ALBUMIN 3.3*   No results for input(s): LIPASE, AMYLASE in the last 168 hours. No results for input(s): AMMONIA in the last 168 hours. CBC: Recent  Labs  Lab 12/17/18 1735  WBC 9.8  NEUTROABS 6.7  HGB 13.5  HCT 39.9  MCV 80.6  PLT 208   Cardiac Enzymes: Recent Labs  Lab 12/17/18 1735  CKTOTAL 88   BNP: BNP (last 3 results) No results for input(s): BNP in the last 8760 hours.  ProBNP (last 3 results) No results for input(s): PROBNP in the last 8760 hours.  CBG: Recent Labs  Lab 12/20/18 1148 12/20/18 1632 12/20/18 2147 12/21/18 0556 12/21/18 1145  GLUCAP 195* 156* 137* 124* 193*       Signed:  Shamieka Gullo  [facility] 12/21/2018, 2:30 PM

## 2018-12-21 NOTE — Progress Notes (Signed)
Physical Therapy Treatment Patient Details Name: Crystal Moore MRN: [id number] DOB: 1945/10/28 Today's Date: 12/21/2018    History of Present Illness Crystal Moore is a 73 y.o. female with medical history significant of arthritis, COPD, CVA with residual left hemiparesis, type 2 diabetes, seizures not on antiepileptics presenting to the hospital for evaluation after a fall. MRI revealed several small foci of acute/early subactue infarction with in the R ACA distribution.    PT Comments    Patient seen for mobility progression. Pt continues to present with L side weakness and has tendency for R lateral bias in sitting/standing. This session focused on functional transfer training utilizing Stedy standing frame. Pt requires mod/max A +2 for all mobility this session. Pt follows simple commands consistently with increased time and verbalized minimally during session. Pt is oriented to self. PT recommendation continues to be post acute rehab however pt's family is refusing CIR or SNF and if this is still the case pt will need maximized therapies, [place] aide, and the below equipment. PT will continue to follow acutely and progress according to POC.     Follow Up Recommendations  CIR;Supervision/Assistance - 24 hour;Other (comment)(Pt's daughter refusing CIR/SNF & wants pt to d/c home)     Equipment Recommendations  Wheelchair cushion (measurements PT);Wheelchair (measurements PT);3in1 (PT);Other (comment)(hospital bed, hoyer lift, WC with fixed leg rests, drop arm commode and ambulance transport home. )    Recommendations for Other Services       Precautions / Restrictions Precautions Precautions: Fall    Mobility  Bed Mobility Overal bed mobility: Needs Assistance Bed Mobility: Supine to Sit     Supine to sit: Mod assist;+2 for physical assistance;HOB elevated     General bed mobility comments: assist to bring bilat LE/hips to EOB and to elevate trunk into sitting; pt reaching toward  L side rail to assist with cues   Transfers Overall transfer level: Needs assistance   Transfers: Sit to/from Stand Sit to Stand: Mod assist;+2 physical assistance;Max assist         General transfer comment: pt stood mulitple times utilizing Stedy; assistance required to position feet as pt has tendency to adduct R LE and puch weight to R side in standing; cues for hand placement and L UE supported by therapist (too painful to reach with LUE to Stedy standing frame) and mod A +2 to power up into standing and max A +2 to maintain standing due to R lateral bias   Ambulation/Gait                 Stairs             Wheelchair Mobility    Modified Rankin (Stroke Patients Only) Modified Rankin (Stroke Patients Only) Pre-Morbid Rankin Score: Moderate disability Modified Rankin: Severe disability     Balance Overall balance assessment: Needs assistance Sitting-balance support: Feet supported;No upper extremity supported Sitting balance-Leahy Scale: Fair Sitting balance - Comments: initially max A for sitting balance due to posterior and L lateral lean but progressed to min guard/min A   Standing balance support: Bilateral upper extremity supported;During functional activity Standing balance-Leahy Scale: Zero Standing balance comment: dependent on physical assist                            Cognition Arousal/Alertness: Awake/alert Behavior During Therapy: Flat affect Overall Cognitive Status: No family/caregiver present to determine baseline cognitive functioning Area of Impairment: Following commands;Awareness;Problem solving;Memory;Orientation  Orientation Level: Disoriented to;Place Current Attention Level: Sustained Memory: Decreased recall of precautions;Decreased short-term memory Following Commands: Follows one step commands consistently;Follows one step commands with increased time   Awareness: Emergent Problem Solving:  Slow processing;Decreased initiation;Difficulty sequencing;Requires verbal cues General Comments: pt speaking minimally during session      Exercises      General Comments        Pertinent Vitals/Pain Pain Assessment: No/denies pain Faces Pain Scale: Hurts little more Pain Location: L shoulder with flexion   Pain Descriptors / Indicators: Tightness;Grimacing Pain Intervention(s): Monitored during session;Repositioned    Home Living                      Prior Function            PT Goals (current goals can now be found in the care plan section) Acute Rehab PT Goals Patient Stated Goal: none stated Progress towards PT goals: Progressing toward goals    Frequency    Min 5X/week      PT Plan Discharge plan needs to be updated    Co-evaluation   Reason for Co-Treatment: For patient/therapist safety   OT goals addressed during session: ADL's and self-care      AM-PAC PT "6 Clicks" Mobility   Outcome Measure  Help needed turning from your back to your side while in a flat bed without using bedrails?: Total Help needed moving from lying on your back to sitting on the side of a flat bed without using bedrails?: Total Help needed moving to and from a bed to a chair (including a wheelchair)?: Total Help needed standing up from a chair using your arms (e.g., wheelchair or bedside chair)?: Total Help needed to walk in hospital room?: Total Help needed climbing 3-5 steps with a railing? : Total 6 Click Score: 6    End of Session Equipment Utilized During Treatment: Gait belt Activity Tolerance: Patient tolerated treatment well Patient left: in chair;with call bell/phone within reach;with chair alarm set Nurse Communication: Mobility status PT Visit Diagnosis: Unsteadiness on feet (R26.81);Muscle weakness (generalized) (M62.81);Difficulty in walking, not elsewhere classified (R26.2);Hemiplegia and hemiparesis Hemiplegia - Right/Left: Left Hemiplegia -  dominant/non-dominant: Non-dominant     Time: 5364-6803 PT Time Calculation (min) (ACUTE ONLY): 36 min  Charges:  $Gait Training: 8-22 mins                     Earney Navy, PTA Acute Rehabilitation Services Pager: [phone] Office: [phone]     Darliss Cheney 12/21/2018, 11:04 AM

## 2018-12-21 NOTE — Progress Notes (Signed)
Occupational Therapy Treatment Patient Details Name: Crystal Moore MRN: [id number] DOB: [birth year] Today's Date: 12/21/2018    History of present illness Crystal Moore is a 73 y.o. female with medical history significant of arthritis, COPD, CVA with residual left hemiparesis, type 2 diabetes, seizures not on antiepileptics presenting to the hospital for evaluation after a fall. MRI revealed several small foci of acute/early subactue infarction with in the R ACA distribution.   OT comments  Pt progressing slowly toward OT goals. Making appropriate requests/statements at times, but generally talking minimally. Focus of session on bed mobility, sitting balance, sit<>stand with use of stedy and transferred to chair with +2 assist of stedy. Pt performing grooming at EOB and self fed with min assist to set up tray once in chair. Continues to be disoriented to place, following commands consistently.   Follow Up Recommendations  CIR;Supervision/Assistance - 24 hour(family is refusing any inpatient post acute rehab)    Equipment Recommendations  Hospital bed;Other (comment)(hoyer lift, drop arm commode)    Recommendations for Other Services      Precautions / Restrictions Precautions Precautions: Fall       Mobility Bed Mobility Overal bed mobility: Needs Assistance Bed Mobility: Supine to Sit     Supine to sit: Mod assist;+2 for physical assistance;HOB elevated     General bed mobility comments: assist to bring bilat LE/hips to EOB and to elevate trunk into sitting; pt reaching toward L side rail to assist with cues   Transfers Overall transfer level: Needs assistance   Transfers: Sit to/from Stand Sit to Stand: Mod assist;+2 physical assistance;Max assist         General transfer comment: pt stood mulitple times utilizing Stedy; assistance required to position feet as pt has tendency to adduct R LE and puch weight to R side in standing; cues for hand placement and L UE  supported by therapist (too painful to reach with LUE to Stedy standing frame) and mod A +2 to power up into standing and max A +2 to maintain standing due to R lateral bias     Balance Overall balance assessment: Needs assistance Sitting-balance support: Feet supported;No upper extremity supported Sitting balance-Leahy Scale: Fair Sitting balance - Comments: initially max A for sitting balance due to posterior and L lateral lean but progressed to min guard/min A   Standing balance support: Bilateral upper extremity supported;During functional activity Standing balance-Leahy Scale: Zero Standing balance comment: dependent on physical assist, heavy R side lean in stedy                           ADL either performed or assessed with clinical judgement   ADL Overall ADL's : Needs assistance/impaired Eating/Feeding: Minimal assistance;Sitting Eating/Feeding Details (indicate cue type and reason): assist for cutting food, opening packages Grooming: Wash/dry face;Sitting;Set up                               Functional mobility during ADLs: +2 for physical assistance;Total assistance(with stedy)       Vision       Perception     Praxis      Cognition Arousal/Alertness: Awake/alert Behavior During Therapy: Flat affect Overall Cognitive Status: No family/caregiver present to determine baseline cognitive functioning Area of Impairment: Following commands;Awareness;Problem solving;Memory;Orientation                 Orientation Level: Disoriented to;Place Current  Attention Level: Sustained Memory: Decreased recall of precautions;Decreased short-term memory Following Commands: Follows one step commands consistently;Follows one step commands with increased time   Awareness: Emergent Problem Solving: Slow processing;Decreased initiation;Difficulty sequencing;Requires verbal cues General Comments: pt speaking minimally during session        Exercises      Shoulder Instructions       General Comments      Pertinent Vitals/ Pain       Pain Assessment: No/denies pain Faces Pain Scale: Hurts little more Pain Location: L shoulder with flexion   Pain Descriptors / Indicators: Tightness;Grimacing Pain Intervention(s): Monitored during session;Repositioned  Home Living                                          Prior Functioning/Environment              Frequency  Min 3X/week        Progress Toward Goals  OT Goals(current goals can now be found in the care plan section)  Progress towards OT goals: Progressing toward goals  Acute Rehab OT Goals Patient Stated Goal: none stated OT Goal Formulation: With patient Time For Goal Achievement: 01/01/19 Potential to Achieve Goals: Good  Plan Frequency remains appropriate;Discharge plan remains appropriate    Co-evaluation    PT/OT/SLP Co-Evaluation/Treatment: Yes Reason for Co-Treatment: For patient/therapist safety   OT goals addressed during session: ADL's and self-care      AM-PAC OT "6 Clicks" Daily Activity     Outcome Measure   Help from another person eating meals?: A Little Help from another person taking care of personal grooming?: A Lot Help from another person toileting, which includes using toliet, bedpan, or urinal?: Total Help from another person bathing (including washing, rinsing, drying)?: A Lot Help from another person to put on and taking off regular upper body clothing?: A Lot Help from another person to put on and taking off regular lower body clothing?: Total 6 Click Score: 11    End of Session Equipment Utilized During Treatment: Gait belt  OT Visit Diagnosis: Unsteadiness on feet (R26.81);Muscle weakness (generalized) (M62.81);Cognitive communication deficit (R41.841);Hemiplegia and hemiparesis Symptoms and signs involving cognitive functions: Cerebral infarction Hemiplegia - Right/Left: Left Hemiplegia -  dominant/non-dominant: Non-Dominant Hemiplegia - caused by: Cerebral infarction   Activity Tolerance Patient tolerated treatment well   Patient Left in chair;with call bell/phone within reach;with chair alarm set   Nurse Communication          Time: 2202-5427 OT Time Calculation (min): 38 min  Charges: OT General Charges $OT Visit: 1 Visit OT Treatments $Neuromuscular Re-education: 23-37 mins  Nestor Lewandowsky, OTR/L Acute Rehabilitation Services Pager: [phone] Office: [phone]   Malka So 12/21/2018, 11:21 AM

## 2018-12-21 NOTE — TOC Transition Note (Signed)
Transition of Care [facility]) - CM/SW Discharge Note   Patient Details  Name: Crystal Moore MRN: [id number] Date of Birth: 1946-03-04  Transition of Care [facility]) CM/SW Contact:  Pollie Friar, RN Phone Number: 12/21/2018, 11:04 AM   Clinical Narrative:    Family medical to have 3 in 1 delivered to the home.  Daughter to provide transport home. CM attempted to recommend potential ambulance home but daughter feels she can transport her.    Final next level of care: [place] Barriers to Discharge: Barriers Resolved   Patient Goals and CMS Choice Patient states their goals for this hospitalization and ongoing recovery are:: to get home CMS Medicare.gov Compare Post Acute Care list provided to:: Patient Represenative (must comment)(daughter) Choice offered to / list presented to : Adult Children  Discharge Placement                       Discharge Plan and Services   Discharge Planning Services: CM Consult Post Acute Care Choice: Home Health, Durable Medical Equipment          DME Arranged: 3-N-1(with drop arm) DME Agency: (Family medical supply) Date DME Agency Contacted: 12/21/18 Time DME Agency Contacted: 45 Representative spoke with at DME Agency: Jeani Hawking HH Arranged: RN, PT, OT, Nurse's Aide, Speech Therapy, Social Work [facility] Agency: [facility] Date [place]: 12/21/18 Time [place]: 1100 Representative spoke with at [place]: [place] ([place]) Interventions     Readmission Risk Interventions No flowsheet data found.

## 2018-12-21 NOTE — TOC Initial Note (Signed)
Transition of Care [facility]) - Initial/Assessment Note    Patient Details  Name: Crystal Moore MRN: [id number] Date of Birth: Jul 19, 1946  Transition of Care [facility]) CM/SW Contact:    Pollie Friar, RN Phone Number: 12/21/2018, 11:04 AM  Clinical Narrative:                   Expected Discharge Plan: [place] Barriers to Discharge: Barriers Resolved   Patient Goals and CMS Choice Patient states their goals for this hospitalization and ongoing recovery are:: to get home CMS Medicare.gov Compare Post Acute Care list provided to:: Patient Represenative (must comment)(daughter) Choice offered to / list presented to : Adult Children  Expected Discharge Plan and Services Expected Discharge Plan: [place]   Discharge Planning Services: CM Consult Post Acute Care Choice: Home Health, Durable Medical Equipment                   DME Arranged: 3-N-1(with drop arm) DME Agency: (Family medical supply) Date DME Agency Contacted: 12/21/18 Time DME Agency Contacted: 25 Representative spoke with at DME Agency: Jeani Hawking HH Arranged: RN, PT, OT, Nurse's Aide, Speech Therapy, Social Work [facility] Agency: [facility] Date [place]: 12/21/18 Time [place]: 1100 Representative spoke with at [place]: Dorian Pod  Prior Living Arrangements/Services   Lives with:: Adult Children(daughter) Patient language and need for interpreter reviewed:: Yes(no needs) Do you feel safe going back to the place where you live?: Yes      Need for Family Participation in Patient Care: Yes (Comment)(24 hour supervision and assistance) Care giver support system in place?: Yes (comment)(family and caregivers) Current home services: Other (comment), DME(caregivers/ wheelchair) Criminal Activity/Legal Involvement Pertinent to Current Situation/Hospitalization: No - Comment as needed  Activities of Daily Living Home Assistive Devices/Equipment: Wheelchair ADL  Screening (condition at time of admission) Patient's cognitive ability adequate to safely complete daily activities?: Yes Is the patient deaf or have difficulty hearing?: No Does the patient have difficulty seeing, even when wearing glasses/contacts?: No Does the patient have difficulty concentrating, remembering, or making decisions?: No Patient able to express need for assistance with ADLs?: Yes Does the patient have difficulty dressing or bathing?: Yes Independently performs ADLs?: No Communication: Needs assistance Is this a change from baseline?: Pre-admission baseline Dressing (OT): Needs assistance Is this a change from baseline?: Pre-admission baseline Grooming: Needs assistance Is this a change from baseline?: Pre-admission baseline Feeding: Independent Bathing: Needs assistance Is this a change from baseline?: Pre-admission baseline Toileting: Needs assistance Is this a change from baseline?: Pre-admission baseline In/Out Bed: Dependent Is this a change from baseline?: Pre-admission baseline Walks in Home: Dependent Is this a change from baseline?: Pre-admission baseline Does the patient have difficulty walking or climbing stairs?: Yes Weakness of Legs: Left Weakness of Arms/Hands: Left  Permission Sought/Granted                  Emotional Assessment Appearance:: Appears stated age   Affect (typically observed): Accepting     Psych Involvement: No (comment)  Admission diagnosis:  found on floor Patient Active Problem List   Diagnosis Date Noted  . Hypokalemia 12/18/2018  . Acute CVA (cerebrovascular accident) ([place]) 12/17/2018  . Malnutrition of moderate degree 05/31/2018  . Hematochezia 05/29/2018  . Ischemic colitis ([place]) 05/29/2018  . Acute encephalopathy 05/29/2018  . Type 2 diabetes mellitus ([place]) 05/29/2018  . Hypertension 05/29/2018  . Depression 05/29/2018  . COPD (chronic obstructive pulmonary disease) (  [place]) 05/29/2018  . Sepsis ([place])  05/28/2018  . Diabetes ([place]) 03/16/2018  . ILD (interstitial lung disease) ([place]) 02/18/2018  . Chronic respiratory failure with hypoxia ([place]) 02/18/2018  . COPD (chronic obstructive pulmonary disease) ([place]) 01/12/2018  . Tobacco abuse 01/12/2018   PCP:  [name], MD Pharmacy:   [facility] [place], [place] [address] [place] [address] Phone: [phone] Fax: [phone]     Social Determinants of Health (SDOH) Interventions    Readmission Risk Interventions No flowsheet data found.

## 2018-12-21 NOTE — Progress Notes (Signed)
Nurse went over discharge with family. Family verbalized understanding.All questions and concerns addressed. Discharging home with all belongings. Taking down in a wheelchair

## 2019-01-11 ENCOUNTER — Telehealth: Payer: Self-pay

## 2019-01-11 NOTE — Telephone Encounter (Signed)
LOV 06/10/18. Per Dr. Loanne Drilling, f/u in 1 mo. Called pt to schedule appt. LVM requesting returned call.

## 2019-01-13 ENCOUNTER — Emergency Department (HOSPITAL_COMMUNITY): Payer: Medicare Other

## 2019-01-13 ENCOUNTER — Encounter (HOSPITAL_COMMUNITY): Payer: Self-pay

## 2019-01-13 ENCOUNTER — Other Ambulatory Visit: Payer: Self-pay

## 2019-01-13 ENCOUNTER — Inpatient Hospital Stay (HOSPITAL_COMMUNITY)
Admission: EM | Admit: 2019-01-13 | Discharge: 2019-01-17 | DRG: 064 | Disposition: A | Discharge status: Skilled Nursing Facility | Payer: Medicare Other | Attending: Internal Medicine | Admitting: Internal Medicine

## 2019-01-13 DIAGNOSIS — E1159 Type 2 diabetes mellitus with other circulatory complications: Secondary | ICD-10-CM | POA: Diagnosis not present

## 2019-01-13 DIAGNOSIS — E785 Hyperlipidemia, unspecified: Secondary | ICD-10-CM | POA: Diagnosis present

## 2019-01-13 DIAGNOSIS — I69354 Hemiplegia and hemiparesis following cerebral infarction affecting left non-dominant side: Secondary | ICD-10-CM

## 2019-01-13 DIAGNOSIS — Z833 Family history of diabetes mellitus: Secondary | ICD-10-CM

## 2019-01-13 DIAGNOSIS — Z794 Long term (current) use of insulin: Secondary | ICD-10-CM

## 2019-01-13 DIAGNOSIS — N179 Acute kidney failure, unspecified: Secondary | ICD-10-CM

## 2019-01-13 DIAGNOSIS — G9341 Metabolic encephalopathy: Secondary | ICD-10-CM | POA: Diagnosis present

## 2019-01-13 DIAGNOSIS — E876 Hypokalemia: Secondary | ICD-10-CM | POA: Diagnosis present

## 2019-01-13 DIAGNOSIS — I10 Essential (primary) hypertension: Secondary | ICD-10-CM | POA: Diagnosis present

## 2019-01-13 DIAGNOSIS — I6330 Cerebral infarction due to thrombosis of unspecified cerebral artery: Secondary | ICD-10-CM | POA: Insufficient documentation

## 2019-01-13 DIAGNOSIS — Z7982 Long term (current) use of aspirin: Secondary | ICD-10-CM

## 2019-01-13 DIAGNOSIS — Z79899 Other long term (current) drug therapy: Secondary | ICD-10-CM

## 2019-01-13 DIAGNOSIS — I672 Cerebral atherosclerosis: Secondary | ICD-10-CM | POA: Diagnosis present

## 2019-01-13 DIAGNOSIS — F17210 Nicotine dependence, cigarettes, uncomplicated: Secondary | ICD-10-CM | POA: Diagnosis present

## 2019-01-13 DIAGNOSIS — Z7902 Long term (current) use of antithrombotics/antiplatelets: Secondary | ICD-10-CM

## 2019-01-13 DIAGNOSIS — I69328 Other speech and language deficits following cerebral infarction: Secondary | ICD-10-CM

## 2019-01-13 DIAGNOSIS — Z20828 Contact with and (suspected) exposure to other viral communicable diseases: Secondary | ICD-10-CM | POA: Diagnosis present

## 2019-01-13 DIAGNOSIS — E11649 Type 2 diabetes mellitus with hypoglycemia without coma: Secondary | ICD-10-CM | POA: Diagnosis not present

## 2019-01-13 DIAGNOSIS — G459 Transient cerebral ischemic attack, unspecified: Secondary | ICD-10-CM | POA: Insufficient documentation

## 2019-01-13 DIAGNOSIS — E119 Type 2 diabetes mellitus without complications: Secondary | ICD-10-CM | POA: Diagnosis present

## 2019-01-13 DIAGNOSIS — R29709 NIHSS score 9: Secondary | ICD-10-CM | POA: Diagnosis present

## 2019-01-13 DIAGNOSIS — R569 Unspecified convulsions: Secondary | ICD-10-CM | POA: Diagnosis present

## 2019-01-13 DIAGNOSIS — E1165 Type 2 diabetes mellitus with hyperglycemia: Secondary | ICD-10-CM | POA: Diagnosis present

## 2019-01-13 DIAGNOSIS — R413 Other amnesia: Secondary | ICD-10-CM | POA: Diagnosis present

## 2019-01-13 DIAGNOSIS — R531 Weakness: Secondary | ICD-10-CM

## 2019-01-13 DIAGNOSIS — R4182 Altered mental status, unspecified: Secondary | ICD-10-CM | POA: Diagnosis not present

## 2019-01-13 DIAGNOSIS — J449 Chronic obstructive pulmonary disease, unspecified: Secondary | ICD-10-CM | POA: Diagnosis present

## 2019-01-13 DIAGNOSIS — I63521 Cerebral infarction due to unspecified occlusion or stenosis of right anterior cerebral artery: Principal | ICD-10-CM | POA: Diagnosis present

## 2019-01-13 DIAGNOSIS — G9340 Encephalopathy, unspecified: Secondary | ICD-10-CM | POA: Diagnosis present

## 2019-01-13 LAB — HEPATIC FUNCTION PANEL
ALT: 21 U/L (ref 0–44)
AST: 24 U/L (ref 15–41)
Albumin: 3.6 g/dL (ref 3.5–5.0)
Alkaline Phosphatase: 84 U/L (ref 38–126)
Bilirubin, Direct: 0.1 mg/dL (ref 0.0–0.2)
Indirect Bilirubin: 0.5 mg/dL (ref 0.3–0.9)
Total Bilirubin: 0.6 mg/dL (ref 0.3–1.2)
Total Protein: 7 g/dL (ref 6.5–8.1)

## 2019-01-13 LAB — CBC WITH DIFFERENTIAL/PLATELET
Abs Immature Granulocytes: 0.04 10*3/uL (ref 0.00–0.07)
Basophils Absolute: 0.1 10*3/uL (ref 0.0–0.1)
Basophils Relative: 0 %
Eosinophils Absolute: 0.1 10*3/uL (ref 0.0–0.5)
Eosinophils Relative: 1 %
HCT: 42.3 % (ref 36.0–46.0)
Hemoglobin: 13.6 g/dL (ref 12.0–15.0)
Immature Granulocytes: 0 %
Lymphocytes Relative: 28 %
Lymphs Abs: 3.1 10*3/uL (ref 0.7–4.0)
MCH: 27.1 pg (ref 26.0–34.0)
MCHC: 32.2 g/dL (ref 30.0–36.0)
MCV: 84.4 fL (ref 80.0–100.0)
Monocytes Absolute: 0.8 10*3/uL (ref 0.1–1.0)
Monocytes Relative: 7 %
Neutro Abs: 7.3 10*3/uL (ref 1.7–7.7)
Neutrophils Relative %: 64 %
Platelets: 247 10*3/uL (ref 150–400)
RBC: 5.01 MIL/uL (ref 3.87–5.11)
RDW: 14.9 % (ref 11.5–15.5)
WBC: 11.3 10*3/uL — ABNORMAL HIGH (ref 4.0–10.5)
nRBC: 0 % (ref 0.0–0.2)

## 2019-01-13 LAB — BASIC METABOLIC PANEL
Anion gap: 15 (ref 5–15)
BUN: 24 mg/dL — ABNORMAL HIGH (ref 8–23)
CO2: 24 mmol/L (ref 22–32)
Calcium: 9.2 mg/dL (ref 8.9–10.3)
Chloride: 100 mmol/L (ref 98–111)
Creatinine, Ser: 1.31 mg/dL — ABNORMAL HIGH (ref 0.44–1.00)
GFR calc Af Amer: 47 mL/min — ABNORMAL LOW (ref >60)
GFR calc non Af Amer: 41 mL/min — ABNORMAL LOW (ref >60)
Glucose, Bld: 86 mg/dL (ref 70–99)
Potassium: 4.1 mmol/L (ref 3.5–5.1)
Sodium: 139 mmol/L (ref 135–145)

## 2019-01-13 LAB — BRAIN NATRIURETIC PEPTIDE: B Natriuretic Peptide: 14 pg/mL (ref 0.0–100.0)

## 2019-01-13 LAB — MAGNESIUM: Magnesium: 1.4 mg/dL — ABNORMAL LOW (ref 1.7–2.4)

## 2019-01-13 LAB — PROTIME-INR
INR: 1.1 (ref 0.8–1.2)
Prothrombin Time: 13.6 seconds (ref 11.4–15.2)

## 2019-01-13 LAB — AMMONIA: Ammonia: 28 umol/L (ref 9–35)

## 2019-01-13 LAB — TROPONIN I: Troponin I: <0.03 ng/mL (ref <0.03)

## 2019-01-13 MED ORDER — LORAZEPAM 2 MG/ML IJ SOLN
1.0000 mg | Freq: Once | INTRAMUSCULAR | Status: AC
  Administered 2019-01-13: 1 mg via INTRAVENOUS
  Filled 2019-01-13: qty 1

## 2019-01-13 NOTE — ED Notes (Signed)
ED TO INPATIENT HANDOFF REPORT  ED Nurse Name and Phone #:  Lenice Pressman [phone]  S Name/Age/Gender Crystal Moore 73 y.o. female Room/Bed: 020C/020C  Code Status   Code Status: Prior  Home/SNF/Other Home Patient oriented to: self, place and situation Is this baseline? Yes   Triage Complete: Triage complete  Chief Complaint TIA  Triage Note Pt from home, lives with daughter, via ems; LSN 1900 last night; per daughter, pt unable to; pt began speaking on ems arrival; hx previous strokes; residual L side paralysis; bed bound  CBG 120 138/70 P 78  Patrice (daughter) [phone]   Allergies No Known Allergies  Level of Care/Admitting Diagnosis ED Disposition    ED Disposition Condition [place]: [place] [100100]  Level of Care: Telemetry Medical [104]  I expect the patient will be discharged within 24 hours: No (not a candidate for 5C-Observation unit)  Covid Evaluation: Screening Protocol (No Symptoms)  Diagnosis: Acute encephalopathy [938182]  Admitting Physician: Rise Patience [phone]  Attending Physician: Rise Patience Lei.Right  PT Class (Do Not Modify): Observation [104]  PT Acc Code (Do Not Modify): Observation [10022]       B Medical/Surgery History Past Medical History:  Diagnosis Date  . Arthritis   . COPD (chronic obstructive pulmonary disease) ([place])   . CVA (cerebral vascular accident) ([place])   . Diabetes ([place])   . Diabetes mellitus without complication ([employer])   . Hemiparesis affecting left side as late effect of cerebrovascular accident (CVA) ([place])   . Pyelonephritis   . Seizures ([place])   . Stroke ([place])   . Tobacco use   . Tobacco use disorder   . UTI (urinary tract infection)    Past Surgical History:  Procedure Laterality Date  . TUBAL LIGATION       A IV Location/Drains/Wounds Patient Lines/Drains/Airways Status   Active Line/Drains/Airways    Name:   Placement date:   Placement  time:   Site:   Days:   Peripheral IV 05/30/18 Anterior;Left Forearm   05/30/18    1900    Forearm   228   Peripheral IV 01/13/19 Left Forearm   01/13/19    1936    Forearm   less than 1   External Urinary Catheter   12/17/18    1817    -   27          Intake/Output Last 24 hours No intake or output data in the 24 hours ending 01/13/19 2332  Labs/Imaging Results for orders placed or performed during the hospital encounter of 01/13/19 (from the past 48 hour(s))  CBC with Differential     Status: Abnormal   Collection Time: 01/13/19  7:30 PM  Result Value Ref Range   WBC 11.3 (H) 4.0 - 10.5 K/uL   RBC 5.01 3.87 - 5.11 MIL/uL   Hemoglobin 13.6 12.0 - 15.0 g/dL   HCT 42.3 36.0 - 46.0 %   MCV 84.4 80.0 - 100.0 fL   MCH 27.1 26.0 - 34.0 pg   MCHC 32.2 30.0 - 36.0 g/dL   RDW 14.9 11.5 - 15.5 %   Platelets 247 150 - 400 K/uL   nRBC 0.0 0.0 - 0.2 %   Neutrophils Relative % 64 %   Neutro Abs 7.3 1.7 - 7.7 K/uL   Lymphocytes Relative 28 %   Lymphs Abs 3.1 0.7 - 4.0 K/uL   Monocytes Relative 7 %   Monocytes  Absolute 0.8 0.1 - 1.0 K/uL   Eosinophils Relative 1 %   Eosinophils Absolute 0.1 0.0 - 0.5 K/uL   Basophils Relative 0 %   Basophils Absolute 0.1 0.0 - 0.1 K/uL   Immature Granulocytes 0 %   Abs Immature Granulocytes 0.04 0.00 - 0.07 K/uL    Comment: Performed at [facility] [address] [place], [place] [address]  Basic metabolic panel     Status: Abnormal   Collection Time: 01/13/19  7:30 PM  Result Value Ref Range   Sodium 139 135 - 145 mmol/L   Potassium 4.1 3.5 - 5.1 mmol/L   Chloride 100 98 - 111 mmol/L   CO2 24 22 - 32 mmol/L   Glucose, Bld 86 70 - 99 mg/dL   BUN 24 (H) 8 - 23 mg/dL   Creatinine, Ser 1.31 (H) 0.44 - 1.00 mg/dL   Calcium 9.2 8.9 - 10.3 mg/dL   GFR calc non Af Amer 41 (L) >60 mL/min   GFR calc Af Amer 47 (L) >60 mL/min   Anion gap 15 5 - 15    Comment: Performed at [facility] [address], [place], [place] [address]  Hepatic  function panel     Status: None   Collection Time: 01/13/19  7:30 PM  Result Value Ref Range   Total Protein 7.0 6.5 - 8.1 g/dL   Albumin 3.6 3.5 - 5.0 g/dL   AST 24 15 - 41 U/L   ALT 21 0 - 44 U/L   Alkaline Phosphatase 84 38 - 126 U/L   Total Bilirubin 0.6 0.3 - 1.2 mg/dL   Bilirubin, Direct 0.1 0.0 - 0.2 mg/dL   Indirect Bilirubin 0.5 0.3 - 0.9 mg/dL    Comment: Performed at [facility] [address] [place], [place] [address]  Magnesium     Status: Abnormal   Collection Time: 01/13/19  7:30 PM  Result Value Ref Range   Magnesium 1.4 (L) 1.7 - 2.4 mg/dL    Comment: Performed at [facility] [address], [place], [place] [address]  Protime-INR     Status: None   Collection Time: 01/13/19  7:30 PM  Result Value Ref Range   Prothrombin Time 13.6 11.4 - 15.2 seconds   INR 1.1 0.8 - 1.2    Comment: (NOTE) INR goal varies based on device and disease states. Performed at [facility], [place] [address], [place], [place] [address]   Troponin I - ONCE - STAT     Status: None   Collection Time: 01/13/19  7:30 PM  Result Value Ref Range   Troponin I <0.03 <0.03 ng/mL    Comment: Performed at [facility] [address], [place], [place] [address]  Brain natriuretic peptide     Status: None   Collection Time: 01/13/19  7:30 PM  Result Value Ref Range   B Natriuretic Peptide 14.0 0.0 - 100.0 pg/mL    Comment: Performed at [facility] [address] [place], [place] [address]  Ammonia     Status: None   Collection Time: 01/13/19  7:30 PM  Result Value Ref Range   Ammonia 28 9 - 35 umol/L    Comment: Performed at [facility], [place] [address] [place], [place] [address]   Mr Brain 34 Contrast  Result Date: 01/13/2019 CLINICAL DATA:  73 y/o  F; altered mental status. EXAM: MRI HEAD WITHOUT CONTRAST TECHNIQUE:  Multiplanar, multiecho pulse sequences of the brain and surrounding structures were obtained without intravenous contrast. COMPARISON:   12/17/2018 CT head and MRI head.  12/18/2018 CTA head. FINDINGS: Brain: Patchy right ACA distribution infarction with a small focus of petechial hemorrhage in the right frontal lobe, T2 FLAIR hyperintense signal abnormality, and mild local mass effect. The distribution of the infarct is increased in comparison with prior MRI of the head and there are new areas of reduced diffusion indicating interval expansion/recrudescence of the prior infarction. Very small chronic infarctions are present within the bilateral cerebellar hemispheres, the pons, and throughout the right greater than left basal ganglia as well as corona radiata. There are large confluent nonspecific T2 FLAIR hyperintensities in subcortical and periventricular white matter are compatible with severe chronic microvascular ischemic changes. There is severe volume loss of the brain. No extra-axial collection, hydrocephalus, or herniation. Vascular: Normal flow voids. Skull and upper cervical spine: Normal marrow signal. Sinuses/Orbits: Negative. Other: Bilateral intra-ocular lens replacement. IMPRESSION: 1. Right ACA distribution infarction with a small focus of age-indeterminate petechial hemorrhage in the right frontal lobe. The area of infarction is increased in size from the 12/17/2018 MRI and there are new areas of reduced diffusion indicating interval ischemia. 2. Background of severe chronic microvascular ischemic changes and volume loss of the brain. Multiple very small chronic infarctions within cerebellum, brainstem, and basal ganglia. These results were called by telephone at the time of interpretation on 01/13/2019 at 11:08 pm to Dr. Lonzo Candy , who verbally acknowledged these results. Electronically Signed   By: Kristine Garbe M.D.   On: 01/13/2019 23:10   Dg Chest Portable 1 View  Result Date: 01/13/2019 CLINICAL DATA:  Altered mental status today. EXAM: PORTABLE CHEST 1 VIEW COMPARISON:  CT chest 02/15/2018. Single-view of  the chest 12/17/2018. FINDINGS: The lungs are emphysematous with coarsening of the pulmonary interstitium. Calcified granuloma right lower lobe noted. Lungs otherwise clear. Heart size normal. Atherosclerosis is seen. No pneumothorax or pleural fluid. No acute or focal bony abnormality. IMPRESSION: No acute disease. Emphysema. Atherosclerosis. Electronically Signed   By: Inge Rise M.D.   On: 01/13/2019 19:24    Pending Labs Unresulted Labs (From admission, onward)    Start     Ordered   01/13/19 1838  Urinalysis, Routine w reflex microscopic  Once,   STAT     01/13/19 1840   Signed and Held  CBC  (enoxaparin (LOVENOX)    CrCl >/= 30 ml/min)  Once,   R    Comments: Baseline for enoxaparin therapy IF NOT ALREADY DRAWN.  Notify MD if PLT < 100 K.    Signed and Held   Signed and Held  Creatinine, serum  (enoxaparin (LOVENOX)    CrCl >/= 30 ml/min)  Once,   R    Comments: Baseline for enoxaparin therapy IF NOT ALREADY DRAWN.    Signed and Held   Signed and Held  Creatinine, serum  (enoxaparin (LOVENOX)    CrCl >/= 30 ml/min)  Weekly,   R    Comments: while on enoxaparin therapy    Signed and Held   Signed and Held  Comprehensive metabolic panel  Tomorrow morning,   R     Signed and Held   Signed and Held  CBC  Tomorrow morning,   R     Signed and Held          Vitals/Pain Today's Vitals   01/13/19 2145 01/13/19 2200 01/13/19 2326 01/13/19 2330  BP: 133/66 Marland Kitchen)  135/59 (!) 146/72 (!) 128/58  Pulse: 76 79 86 (!) 32  Resp: 20  11 15   Temp:      TempSrc:      SpO2: 96% 92% 96% 95%  Weight:      PainSc:        Isolation Precautions No active isolations  Medications Medications  LORazepam (ATIVAN) injection 1 mg (1 mg Intravenous Given 01/13/19 2203)    Mobility non-ambulatory High fall risk   Focused Assessments Neuro Assessment Handoff:  Swallow screen pass? Yes  Cardiac Rhythm: Normal sinus rhythm NIH Stroke Scale ( + Modified Stroke Scale Criteria)  Interval:  Initial Level of Consciousness (1a.)   : Alert, keenly responsive LOC Questions (1b. )   +: Answers both questions correctly LOC Commands (1c. )   + : Performs both tasks correctly Best Gaze (2. )  +: Normal Visual (3. )  +: No visual loss Facial Palsy (4. )    : Minor paralysis Motor Arm, Left (5a. )   +: No effort against gravity Motor Arm, Right (5b. )   +: No drift Motor Leg, Left (6a. )   +: No effort against gravity Motor Leg, Right (6b. )   +: Drift Limb Ataxia (7. ): Absent Sensory (8. )   +: Mild-to-moderate sensory loss, patient feels pinprick is less sharp or is dull on the affected side, or there is a loss of superficial pain with pinprick, but patient is aware of being touched Best Language (9. )   +: No aphasia Dysarthria (10. ): Normal Extinction/Inattention (11.)   +: No Abnormality Modified SS Total  +: 8 Complete NIHSS TOTAL: 9     Neuro Assessment: Exceptions to WDL Neuro Checks:   Initial (01/13/19 2151)  Last Documented NIHSS Modified Score: 8 (01/13/19 2151) Has TPA been given? No If patient is a Neuro Trauma and patient is going to OR before floor call report to [place] nurse: [phone] or [phone]     R Recommendations: See Admitting Provider Note  Report given to:   Additional Notes:

## 2019-01-13 NOTE — ED Provider Notes (Signed)
[facility] EMERGENCY DEPARTMENT Provider Note   CSN: [id number] Arrival date & time: 01/13/19  1819    History   Chief Complaint Chief Complaint  Patient presents with  . Transient Ischemic Attack    HPI Crystal Moore is a 73 y.o. female.     The history is provided by the patient, the EMS personnel, medical records and a relative.  Altered Mental Status Presenting symptoms: confusion, disorientation and partial responsiveness   Presenting symptoms: no unresponsiveness   Severity:  Moderate Most recent episode:  Today Episode history:  Single Duration: unable to specify, last known normal at 0230 am when daughter leaving for work. Timing:  Constant Progression:  Partially resolved Chronicity:  New Context: recent illness   Context: not head injury, taking medications as prescribed, not nursing home resident and not recent infection   Associated symptoms: slurred speech   Associated symptoms: no abdominal pain, normal movement, no difficulty breathing, no fever, no nausea, no rash and no vomiting     Past Medical History:  Diagnosis Date  . Arthritis   . COPD (chronic obstructive pulmonary disease) ([place])   . CVA (cerebral vascular accident) ([place])   . Diabetes ([place])   . Diabetes mellitus without complication ([place])   . Hemiparesis affecting left side as late effect of cerebrovascular accident (CVA) ([place])   . Pyelonephritis   . Seizures ([place])   . Stroke ([place])   . Tobacco use   . Tobacco use disorder   . UTI (urinary tract infection)     Patient Active Problem List   Diagnosis Date Noted  . TIA (transient ischemic attack) 01/13/2019  . Type 2 diabetes mellitus with vascular disease ([place]) 01/13/2019  . Hypokalemia 12/18/2018  . Acute CVA (cerebrovascular accident) ([place]) 12/17/2018  . Malnutrition of moderate degree 05/31/2018  . Hematochezia 05/29/2018  . Ischemic colitis ([place]) 05/29/2018  . Acute encephalopathy 05/29/2018  . Type 2  diabetes mellitus ([place]) 05/29/2018  . Hypertension 05/29/2018  . Depression 05/29/2018  . COPD (chronic obstructive pulmonary disease) ([place]) 05/29/2018  . Sepsis ([place]) 05/28/2018  . Diabetes ([place]) 03/16/2018  . ILD (interstitial lung disease) ([place]) 02/18/2018  . Chronic respiratory failure with hypoxia ([place]) 02/18/2018  . COPD (chronic obstructive pulmonary disease) ([place]) 01/12/2018  . Tobacco abuse 01/12/2018    Past Surgical History:  Procedure Laterality Date  . TUBAL LIGATION       OB History   No obstetric history on file.      Home Medications    Prior to Admission medications   Medication Sig Start Date End Date Taking? Authorizing Provider  aspirin EC 81 MG tablet Take 1 tablet (81 mg total) by mouth daily. 12/21/18 12/21/19 Yes Mikhail, Velta Addison, DO  atorvastatin (LIPITOR) 80 MG tablet Take 1 tablet (80 mg total) by mouth daily at 6 PM. 12/21/18  Yes Mikhail, Velta Addison, DO  clopidogrel (PLAVIX) 75 MG tablet Take 1 tablet (75 mg total) by mouth daily. 12/22/18  Yes Mikhail, [place], DO  LANTUS 100 UNIT/ML injection Take 15 Units by mouth daily after breakfast.  11/28/18  Yes [provider]  metFORMIN (GLUCOPHAGE) 500 MG tablet Take 1,000 mg by mouth 2 (two) times a day.    Yes [provider]  nicotine (NICODERM CQ - DOSED IN MG/24 HOURS) 21 mg/24hr patch Place 21 mg onto the skin daily.   Yes [provider]  nicotine (NICOTROL) 10 MG inhaler Inhale 1 Cartridge (1 continuous puffing total) into the lungs as  needed for smoking cessation. Patient not taking: Reported on 01/13/2019 02/18/18   Rigoberto Noel, MD    Family History Family History  Problem Relation Age of Onset  . Diabetes Mother   . Diabetes Sister   . Diabetes Brother     Social History Social History   Tobacco Use  . Smoking status: Current Every Day Smoker    Packs/day: 1.00    Years: 40.00    Pack years: 40.00    Types: Cigarettes  . Smokeless tobacco: Never Used   Substance Use Topics  . Alcohol use: Never    Frequency: Never  . Drug use: Never     Allergies   Patient has no known allergies.   Review of Systems Review of Systems  Constitutional: Negative for fever.  Gastrointestinal: Negative for abdominal pain, nausea and vomiting.  Skin: Negative for rash.  Psychiatric/Behavioral: Positive for confusion.  All other systems reviewed and are negative.    Physical Exam Updated Vital Signs BP 133/66   Pulse 76   Temp 98.7 F (37.1 C)   Resp 20   Wt 60.8 kg   SpO2 96%   BMI 24.51 kg/m   Physical Exam Vitals signs and nursing note reviewed.  Constitutional:      Appearance: She is well-developed.  HENT:     Head: Normocephalic and atraumatic.     Comments: Left-sided facial droop  No obvious slurred speech Eyes:     Conjunctiva/sclera: Conjunctivae normal.  Neck:     Musculoskeletal: Neck supple. No muscular tenderness.  Cardiovascular:     Rate and Rhythm: Normal rate.  Pulmonary:     Effort: Pulmonary effort is normal. No respiratory distress.     Breath sounds: No stridor. No wheezing or rhonchi.  Abdominal:     Palpations: Abdomen is soft.     Tenderness: There is no abdominal tenderness.  Musculoskeletal:     Right lower leg: No edema.     Left lower leg: No edema.  Skin:    General: Skin is warm and dry.     Capillary Refill: Capillary refill takes less than 2 seconds.     Findings: No rash.  Neurological:     Mental Status: She is alert.     Comments: Left-sided hemibody weakness, no obvious weakness right upper extremity right lower extremity, moves both spontaneously without difficulty, sensation intact right hemibody  Left-sided facial numbness  Patient alert and interactive, oriented to self, knows she is from [place], not oriented to situation or place      ED Treatments / Results  Labs (all labs ordered are listed, but only abnormal results are displayed) Labs Reviewed  CBC WITH  DIFFERENTIAL/PLATELET - Abnormal; Notable for the following components:      Result Value   WBC 11.3 (*)    All other components within normal limits  BASIC METABOLIC PANEL - Abnormal; Notable for the following components:   BUN 24 (*)    Creatinine, Ser 1.31 (*)    GFR calc non Af Amer 41 (*)    GFR calc Af Amer 47 (*)    All other components within normal limits  MAGNESIUM - Abnormal; Notable for the following components:   Magnesium 1.4 (*)    All other components within normal limits  HEPATIC FUNCTION PANEL  PROTIME-INR  TROPONIN I  BRAIN NATRIURETIC PEPTIDE  AMMONIA  URINALYSIS, ROUTINE W REFLEX MICROSCOPIC    EKG None  Radiology Dg Chest Portable 1 View  Result Date: 01/13/2019 CLINICAL DATA:  Altered mental status today. EXAM: PORTABLE CHEST 1 VIEW COMPARISON:  CT chest 02/15/2018. Single-view of the chest 12/17/2018. FINDINGS: The lungs are emphysematous with coarsening of the pulmonary interstitium. Calcified granuloma right lower lobe noted. Lungs otherwise clear. Heart size normal. Atherosclerosis is seen. No pneumothorax or pleural fluid. No acute or focal bony abnormality. IMPRESSION: No acute disease. Emphysema. Atherosclerosis. Electronically Signed   By: Inge Rise M.D.   On: 01/13/2019 19:24    Procedures Procedures (including critical care time)  Medications Ordered in ED Medications  LORazepam (ATIVAN) injection 1 mg (1 mg Intravenous Given 01/13/19 2203)     Initial Impression / Assessment and Plan / ED Course  I have reviewed the triage vital signs and the nursing notes.  Pertinent labs & imaging results that were available during my care of the patient were reviewed by me and considered in my medical decision making (see chart for details).        Medical Decision Making: Crystal Moore is a 73 y.o. female who presented to the ED today with confusion, disorientation.  Reviewed and confirmed nursing documentation for past medical history,  family history, social history.  On my initial exam, the pt was calm, cooperative, conversant, follows commands appropriately, GCS 15, not tachycardic, not hypotensive, afebrile, no increased work of breathing or respiratory distress, no signs of impending respiratory failure.  Patient oriented to self, knows she is from [place], not oriented to situation or place Discussed case with daughter over the phone, left for work this morning at around 2:30 AM, patient was normal state of affairs, when she returned home at 4:30 PM patient acutely not oriented, daughter concerned so called EMS to have patient brought for further evaluation and care  Multiple etiologies of altered mental status considered.  Trauma seems unlikely given history. Infection seems less likely given no review of systems positive for infection, will obtain chest x-ray, urine studies to further assess Seizure considered, no shaking or movements concerning for seizure, Stroke or intracranial abnormality considered given history, discussed case with neurology, will defer and obtain MRI imaging for further evaluation and care Ingestion/overdose versus withdrawal versus intoxication seem less likely given history, no physical exam findings concerning for ingestion or withdrawal Polypharmacy could be a potential etiology as patient is on multiple medications, no recent dosage changes, medication adjustments or new medications Electrolyte abnormality versus encephalopathy versus uremia versus acidosis seem possible but less likely given history, will obtain ammonia level, electrolyte panel, will calculate anion gap, assess bicarb  All radiology and laboratory studies reviewed independently and with my attending physician, agree with reading provided by radiologist unless otherwise noted.  Upon reassessing patient, patient was calm, resting comfortably, no new complaints Based on the above findings, I believe patient requires  admission.  Will admit patient for MRI imaging, continued evaluation, potential EEG or TIA work-up Patient admitted. The above care was discussed with and agreed upon by my attending physician. Emergency Department Medication Summary:  Medications  LORazepam (ATIVAN) injection 1 mg (1 mg Intravenous Given 01/13/19 2203)       Final Clinical Impressions(s) / ED Diagnoses   Final diagnoses:  None    ED Discharge Orders    None       Lonzo Candy, MD 01/13/19 7353    Isla Pence, MD 01/13/19 2322

## 2019-01-13 NOTE — H&P (Signed)
History and Physical    GETHSEMANE FISCHLER JJH:[id number] DOB: Aug 05, 1945 DOA: 01/13/2019  PCP: [name], MD  Patient coming from: Home.  History obtained from patient's daughter.  Chief Complaint: Confusion.  Difficulty speaking.  HPI: Crystal Moore is a 73 y.o. female with history of CVA admitted last month for acute CVA with left-sided hemiparesis, diabetes mellitus, seizure not on antiepileptics, COPD was found to be acutely confused by patient's daughter.  Patient's daughter states that she left home early in the morning around 2 and came back around 4:30 PM when patient's mother was found to be confused and has had difficulty speaking.  Patient has known history of left-sided hemiparesis.  Patient was brought to the ER.  ED Course: In the ER patient is afebrile labs show increased creatinine of 1.3 from baseline of normal.  EKG shows normal sinus rhythm.  Chest x-ray unremarkable COVID-19 test was negative.  Patient is able to tell her name and follows commands.  On-call neurologist was consulted and at this time admitted for further work-up of acute confusional status with main concern for possible recurrent stroke.  MRI brain was ordered.  Patient passed swallow.  Review of Systems: As per HPI, rest all negative.   Past Medical History:  Diagnosis Date  . Arthritis   . COPD (chronic obstructive pulmonary disease) ([place])   . CVA (cerebral vascular accident) ([place])   . Diabetes ([place])   . Diabetes mellitus without complication ([place])   . Hemiparesis affecting left side as late effect of cerebrovascular accident (CVA) ([place])   . Pyelonephritis   . Seizures ([place])   . Stroke ([place])   . Tobacco use   . Tobacco use disorder   . UTI (urinary tract infection)     Past Surgical History:  Procedure Laterality Date  . TUBAL LIGATION       reports that she has been smoking cigarettes. She has a 40.00 pack-year smoking history. She has never used smokeless tobacco. She  reports that she does not drink alcohol or use drugs.  No Known Allergies  Family History  Problem Relation Age of Onset  . Diabetes Mother   . Diabetes Sister   . Diabetes Brother     Prior to Admission medications   Medication Sig Start Date End Date Taking? Authorizing Provider  aspirin EC 81 MG tablet Take 1 tablet (81 mg total) by mouth daily. 12/21/18 12/21/19 Yes Mikhail, Velta Addison, DO  atorvastatin (LIPITOR) 80 MG tablet Take 1 tablet (80 mg total) by mouth daily at 6 PM. 12/21/18  Yes Mikhail, Velta Addison, DO  clopidogrel (PLAVIX) 75 MG tablet Take 1 tablet (75 mg total) by mouth daily. 12/22/18  Yes Mikhail, [place], DO  LANTUS 100 UNIT/ML injection Take 15 Units by mouth daily after breakfast.  11/28/18  Yes [provider]  metFORMIN (GLUCOPHAGE) 500 MG tablet Take 1,000 mg by mouth 2 (two) times a day.    Yes [provider]  nicotine (NICODERM CQ - DOSED IN MG/24 HOURS) 21 mg/24hr patch Place 21 mg onto the skin daily.   Yes [provider]  nicotine (NICOTROL) 10 MG inhaler Inhale 1 Cartridge (1 continuous puffing total) into the lungs as needed for smoking cessation. Patient not taking: Reported on 01/13/2019 02/18/18   Rigoberto Noel, MD    Physical Exam: Vitals:   01/13/19 1930 01/13/19 2030 01/13/19 2115 01/13/19 2145  BP: 133/60   133/66  Pulse: 80 79 80 76  Resp: 16 15 15  20  Temp:      TempSrc:      SpO2: 99% 98% 95% 96%  Weight:          Constitutional: Moderately built and nourished. Vitals:   01/13/19 1930 01/13/19 2030 01/13/19 2115 01/13/19 2145  BP: 133/60   133/66  Pulse: 80 79 80 76  Resp: 16 15 15 20   Temp:      TempSrc:      SpO2: 99% 98% 95% 96%  Weight:       Eyes: Anicteric no pallor. ENMT: No discharge from the ears eyes nose and mouth. Neck: No mass felt.  No neck rigidity.  No JVD appreciated. Respiratory: No rhonchi or crepitations. Cardiovascular: S1-S2 heard. Abdomen: Soft nontender bowel sounds present.  Musculoskeletal: No edema. Skin: No rash. Neurologic: Mildly lethargic but arousable oriented to name.  Note that patient received Ativan for MRI at the time of my exam.  Left-sided upper and lower extremity weakness which is known.  Right upper and lower extremities almost 5 x 5.  Pupils are equal and reacting to light no facial asymmetry. Psychiatric: Mildly lethargic after receiving Ativan.   Labs on Admission: I have personally reviewed following labs and imaging studies  CBC: Recent Labs  Lab 01/13/19 1930  WBC 11.3*  NEUTROABS 7.3  HGB 13.6  HCT 42.3  MCV 84.4  PLT 875   Basic Metabolic Panel: Recent Labs  Lab 01/13/19 1930  NA 139  K 4.1  CL 100  CO2 24  GLUCOSE 86  BUN 24*  CREATININE 1.31*  CALCIUM 9.2  MG 1.4*   GFR: Estimated Creatinine Clearance: 33.3 mL/min (A) (by C-G formula based on SCr of 1.31 mg/dL (H)). Liver Function Tests: Recent Labs  Lab 01/13/19 1930  AST 24  ALT 21  ALKPHOS 84  BILITOT 0.6  PROT 7.0  ALBUMIN 3.6   No results for input(s): LIPASE, AMYLASE in the last 168 hours. Recent Labs  Lab 01/13/19 1930  AMMONIA 28   Coagulation Profile: Recent Labs  Lab 01/13/19 1930  INR 1.1   Cardiac Enzymes: Recent Labs  Lab 01/13/19 1930  TROPONINI <0.03   BNP (last 3 results) No results for input(s): PROBNP in the last 8760 hours. HbA1C: No results for input(s): HGBA1C in the last 72 hours. CBG: No results for input(s): GLUCAP in the last 168 hours. Lipid Profile: No results for input(s): CHOL, HDL, LDLCALC, TRIG, CHOLHDL, LDLDIRECT in the last 72 hours. Thyroid Function Tests: No results for input(s): TSH, T4TOTAL, FREET4, T3FREE, THYROIDAB in the last 72 hours. Anemia Panel: No results for input(s): VITAMINB12, FOLATE, FERRITIN, TIBC, IRON, RETICCTPCT in the last 72 hours. Urine analysis:    Component Value Date/Time   COLORURINE YELLOW 12/17/2018 2004   APPEARANCEUR CLEAR 12/17/2018 2004   LABSPEC 1.014 12/17/2018  2004   PHURINE 8.0 12/17/2018 2004   GLUCOSEU 50 (A) 12/17/2018 2004   HGBUR NEGATIVE 12/17/2018 2004   BILIRUBINUR NEGATIVE 12/17/2018 2004   [place] NEGATIVE 12/17/2018 2004   PROTEINUR NEGATIVE 12/17/2018 2004   NITRITE NEGATIVE 12/17/2018 2004   LEUKOCYTESUR NEGATIVE 12/17/2018 2004   Sepsis Labs: @LABRCNTIP (procalcitonin:4,lacticidven:4) )No results found for this or any previous visit (from the past 240 hour(s)).   Radiological Exams on Admission: Dg Chest Portable 1 View  Result Date: 01/13/2019 CLINICAL DATA:  Altered mental status today. EXAM: PORTABLE CHEST 1 VIEW COMPARISON:  CT chest 02/15/2018. Single-view of the chest 12/17/2018. FINDINGS: The lungs are emphysematous with coarsening of the pulmonary interstitium.  Calcified granuloma right lower lobe noted. Lungs otherwise clear. Heart size normal. Atherosclerosis is seen. No pneumothorax or pleural fluid. No acute or focal bony abnormality. IMPRESSION: No acute disease. Emphysema. Atherosclerosis. Electronically Signed   By: Inge Rise M.D.   On: 01/13/2019 19:24    EKG: Independently reviewed.  Normal sinus rhythm PVCs.  Assessment/Plan Principal Problem:   Acute encephalopathy Active Problems:   COPD (chronic obstructive pulmonary disease) (HCC)   Hypertension   TIA (transient ischemic attack)   Type 2 diabetes mellitus with vascular disease ([place])    1. Acute encephalopathy primary diagnosis is concerning for recurrence of stroke -MRI brain has been ordered.  Patient has past stroke swallow and we will keep patient on neurochecks continue aspirin Plavix statins.  Physical therapy consult.  Did discuss with Dr. Cheral Marker on-call neurologist. 2. Acute renal failure -cause not clear.  Gently hydrate follow metabolic panel. 3. Diabetes mellitus type 2 -since admission patient has become mildly hypoglycemic.  I am holding off patient's Lantus and keep on sliding scale coverage and closely follow CBGs.  If patient  starts eating and blood sugar gets better restart Lantus.  Hemoglobin A1c was around 12 last month. 4. COPD not actively wheezing.  PRN albuterol. 5. History of seizures presently not on medications.   DVT prophylaxis: Lovenox. Code Status: Full code. Family Communication: Patient's daughter. Disposition Plan: Home. Consults called: Neurologist. Admission status: Observation.   Rise Patience MD [facility] Pager [phone].  If 7PM-7AM, please contact night-coverage [email or web address] Password [facility]  01/13/2019, 10:26 PM

## 2019-01-13 NOTE — ED Triage Notes (Signed)
Pt from home, lives with daughter, via ems; LSN 1900 last night; per daughter, pt unable to; pt began speaking on ems arrival; hx previous strokes; residual L side paralysis; bed bound  CBG 120 138/70 P 78  Patrice (daughter) [phone]

## 2019-01-14 ENCOUNTER — Observation Stay (HOSPITAL_COMMUNITY): Payer: Medicare Other

## 2019-01-14 ENCOUNTER — Inpatient Hospital Stay (HOSPITAL_COMMUNITY): Payer: Medicare Other

## 2019-01-14 DIAGNOSIS — R569 Unspecified convulsions: Secondary | ICD-10-CM | POA: Diagnosis present

## 2019-01-14 DIAGNOSIS — Z20828 Contact with and (suspected) exposure to other viral communicable diseases: Secondary | ICD-10-CM | POA: Diagnosis present

## 2019-01-14 DIAGNOSIS — I69328 Other speech and language deficits following cerebral infarction: Secondary | ICD-10-CM | POA: Diagnosis not present

## 2019-01-14 DIAGNOSIS — E11649 Type 2 diabetes mellitus with hypoglycemia without coma: Secondary | ICD-10-CM | POA: Diagnosis not present

## 2019-01-14 DIAGNOSIS — E785 Hyperlipidemia, unspecified: Secondary | ICD-10-CM | POA: Diagnosis present

## 2019-01-14 DIAGNOSIS — I69354 Hemiplegia and hemiparesis following cerebral infarction affecting left non-dominant side: Secondary | ICD-10-CM | POA: Diagnosis not present

## 2019-01-14 DIAGNOSIS — G9340 Encephalopathy, unspecified: Secondary | ICD-10-CM | POA: Diagnosis not present

## 2019-01-14 DIAGNOSIS — I10 Essential (primary) hypertension: Secondary | ICD-10-CM | POA: Diagnosis present

## 2019-01-14 DIAGNOSIS — Z7982 Long term (current) use of aspirin: Secondary | ICD-10-CM | POA: Diagnosis not present

## 2019-01-14 DIAGNOSIS — I672 Cerebral atherosclerosis: Secondary | ICD-10-CM | POA: Diagnosis present

## 2019-01-14 DIAGNOSIS — I639 Cerebral infarction, unspecified: Secondary | ICD-10-CM

## 2019-01-14 DIAGNOSIS — Z79899 Other long term (current) drug therapy: Secondary | ICD-10-CM | POA: Diagnosis not present

## 2019-01-14 DIAGNOSIS — E876 Hypokalemia: Secondary | ICD-10-CM | POA: Diagnosis not present

## 2019-01-14 DIAGNOSIS — E1159 Type 2 diabetes mellitus with other circulatory complications: Secondary | ICD-10-CM | POA: Diagnosis not present

## 2019-01-14 DIAGNOSIS — R413 Other amnesia: Secondary | ICD-10-CM | POA: Diagnosis present

## 2019-01-14 DIAGNOSIS — Z7902 Long term (current) use of antithrombotics/antiplatelets: Secondary | ICD-10-CM | POA: Diagnosis not present

## 2019-01-14 DIAGNOSIS — G9341 Metabolic encephalopathy: Secondary | ICD-10-CM | POA: Diagnosis present

## 2019-01-14 DIAGNOSIS — F17210 Nicotine dependence, cigarettes, uncomplicated: Secondary | ICD-10-CM | POA: Diagnosis present

## 2019-01-14 DIAGNOSIS — R4182 Altered mental status, unspecified: Secondary | ICD-10-CM | POA: Diagnosis present

## 2019-01-14 DIAGNOSIS — E1165 Type 2 diabetes mellitus with hyperglycemia: Secondary | ICD-10-CM | POA: Diagnosis present

## 2019-01-14 DIAGNOSIS — Z833 Family history of diabetes mellitus: Secondary | ICD-10-CM | POA: Diagnosis not present

## 2019-01-14 DIAGNOSIS — N179 Acute kidney failure, unspecified: Secondary | ICD-10-CM | POA: Diagnosis present

## 2019-01-14 DIAGNOSIS — R531 Weakness: Secondary | ICD-10-CM | POA: Diagnosis not present

## 2019-01-14 DIAGNOSIS — Z794 Long term (current) use of insulin: Secondary | ICD-10-CM | POA: Diagnosis not present

## 2019-01-14 DIAGNOSIS — J449 Chronic obstructive pulmonary disease, unspecified: Secondary | ICD-10-CM | POA: Diagnosis present

## 2019-01-14 DIAGNOSIS — R29709 NIHSS score 9: Secondary | ICD-10-CM | POA: Diagnosis present

## 2019-01-14 DIAGNOSIS — I63521 Cerebral infarction due to unspecified occlusion or stenosis of right anterior cerebral artery: Secondary | ICD-10-CM | POA: Diagnosis present

## 2019-01-14 LAB — GLUCOSE, CAPILLARY
Glucose-Capillary: 143 mg/dL — ABNORMAL HIGH (ref 70–99)
Glucose-Capillary: 48 mg/dL — ABNORMAL LOW (ref 70–99)
Glucose-Capillary: 97 mg/dL (ref 70–99)
Glucose-Capillary: 93 mg/dL (ref 70–99)
Glucose-Capillary: 84 mg/dL (ref 70–99)
Glucose-Capillary: 124 mg/dL — ABNORMAL HIGH (ref 70–99)
Glucose-Capillary: 195 mg/dL — ABNORMAL HIGH (ref 70–99)

## 2019-01-14 LAB — URINALYSIS, ROUTINE W REFLEX MICROSCOPIC
Bilirubin Urine: NEGATIVE
Glucose, UA: NEGATIVE mg/dL
Hgb urine dipstick: NEGATIVE
Ketones, ur: NEGATIVE mg/dL
Leukocytes,Ua: NEGATIVE
Nitrite: NEGATIVE
Protein, ur: NEGATIVE mg/dL
Specific Gravity, Urine: 1.019 (ref 1.005–1.030)
pH: 5 (ref 5.0–8.0)

## 2019-01-14 LAB — COMPREHENSIVE METABOLIC PANEL
ALT: 17 U/L (ref 0–44)
AST: 19 U/L (ref 15–41)
Albumin: 3 g/dL — ABNORMAL LOW (ref 3.5–5.0)
Alkaline Phosphatase: 74 U/L (ref 38–126)
Anion gap: 9 (ref 5–15)
BUN: 25 mg/dL — ABNORMAL HIGH (ref 8–23)
CO2: 25 mmol/L (ref 22–32)
Calcium: 8.6 mg/dL — ABNORMAL LOW (ref 8.9–10.3)
Chloride: 104 mmol/L (ref 98–111)
Creatinine, Ser: 0.86 mg/dL (ref 0.44–1.00)
GFR calc Af Amer: >60 mL/min (ref >60)
GFR calc non Af Amer: >60 mL/min (ref >60)
Glucose, Bld: 106 mg/dL — ABNORMAL HIGH (ref 70–99)
Potassium: 3.5 mmol/L (ref 3.5–5.1)
Sodium: 138 mmol/L (ref 135–145)
Total Bilirubin: 0.6 mg/dL (ref 0.3–1.2)
Total Protein: 6.1 g/dL — ABNORMAL LOW (ref 6.5–8.1)

## 2019-01-14 LAB — CBC
HCT: 37.2 % (ref 36.0–46.0)
Hemoglobin: 12.1 g/dL (ref 12.0–15.0)
MCH: 26.8 pg (ref 26.0–34.0)
MCHC: 32.5 g/dL (ref 30.0–36.0)
MCV: 82.5 fL (ref 80.0–100.0)
Platelets: 218 10*3/uL (ref 150–400)
RBC: 4.51 MIL/uL (ref 3.87–5.11)
RDW: 14.6 % (ref 11.5–15.5)
WBC: 8.9 10*3/uL (ref 4.0–10.5)
nRBC: 0 % (ref 0.0–0.2)

## 2019-01-14 LAB — TSH: TSH: 0.458 u[IU]/mL (ref 0.350–4.500)

## 2019-01-14 LAB — SARS CORONAVIRUS 2: SARS Coronavirus 2: NOT DETECTED

## 2019-01-14 MED ORDER — KCL IN DEXTROSE-NACL 20-5-0.9 MEQ/L-%-% IV SOLN
INTRAVENOUS | Status: DC
  Administered 2019-01-14 – 2019-01-15 (×2): via INTRAVENOUS
  Filled 2019-01-14 (×3): qty 1000

## 2019-01-14 MED ORDER — STROKE: EARLY STAGES OF RECOVERY BOOK
Freq: Once | Status: AC
  Administered 2019-01-14: 01:00:00
  Filled 2019-01-14: qty 1

## 2019-01-14 MED ORDER — ACETAMINOPHEN 325 MG PO TABS: 650.0000 mg | ORAL_TABLET | ORAL | Status: DC | PRN

## 2019-01-14 MED ORDER — ACETAMINOPHEN 160 MG/5ML PO SOLN: 650.0000 mg | ORAL | Status: DC | PRN

## 2019-01-14 MED ORDER — SODIUM CHLORIDE 0.9 % IV SOLN
INTRAVENOUS | Status: DC
  Administered 2019-01-14: via INTRAVENOUS

## 2019-01-14 MED ORDER — MAGNESIUM SULFATE 2 GM/50ML IV SOLN
2.0000 g | Freq: Once | INTRAVENOUS | Status: AC
  Administered 2019-01-14: 2 g via INTRAVENOUS
  Filled 2019-01-14: qty 50

## 2019-01-14 MED ORDER — INSULIN ASPART 100 UNIT/ML ~~LOC~~ SOLN
0.0000 [IU] | Freq: Three times a day (TID) | SUBCUTANEOUS | Status: DC
  Administered 2019-01-15 – 2019-01-16 (×2): 2 [IU] via SUBCUTANEOUS
  Administered 2019-01-16: 3 [IU] via SUBCUTANEOUS
  Administered 2019-01-16 – 2019-01-17 (×3): 2 [IU] via SUBCUTANEOUS

## 2019-01-14 MED ORDER — ATORVASTATIN CALCIUM 80 MG PO TABS
80.0000 mg | ORAL_TABLET | Freq: Every day | ORAL | Status: DC
  Administered 2019-01-14 – 2019-01-16 (×3): 80 mg via ORAL
  Filled 2019-01-14 (×3): qty 1

## 2019-01-14 MED ORDER — ASPIRIN EC 81 MG PO TBEC
81.0000 mg | DELAYED_RELEASE_TABLET | Freq: Every day | ORAL | Status: DC
  Administered 2019-01-14 – 2019-01-17 (×4): 81 mg via ORAL
  Filled 2019-01-14 (×4): qty 1

## 2019-01-14 MED ORDER — DEXTROSE 50 % IV SOLN
INTRAVENOUS | Status: AC
  Administered 2019-01-14: 25 mL
  Filled 2019-01-14: qty 50

## 2019-01-14 MED ORDER — CLOPIDOGREL BISULFATE 75 MG PO TABS
75.0000 mg | ORAL_TABLET | Freq: Every day | ORAL | Status: DC
  Administered 2019-01-14 – 2019-01-17 (×4): 75 mg via ORAL
  Filled 2019-01-14 (×4): qty 1

## 2019-01-14 MED ORDER — INSULIN GLARGINE 100 UNIT/ML ~~LOC~~ SOLN
15.0000 [IU] | Freq: Every day | SUBCUTANEOUS | Status: DC
  Filled 2019-01-14: qty 0.15

## 2019-01-14 MED ORDER — ACETAMINOPHEN 650 MG RE SUPP: 650.0000 mg | RECTAL | Status: DC | PRN

## 2019-01-14 MED ORDER — ALBUTEROL SULFATE (2.5 MG/3ML) 0.083% IN NEBU: 2.5000 mg | INHALATION_SOLUTION | RESPIRATORY_TRACT | Status: DC | PRN

## 2019-01-14 MED ORDER — ENOXAPARIN SODIUM 40 MG/0.4ML ~~LOC~~ SOLN
40.0000 mg | Freq: Every day | SUBCUTANEOUS | Status: DC
  Administered 2019-01-14 – 2019-01-17 (×4): 40 mg via SUBCUTANEOUS
  Filled 2019-01-14 (×4): qty 0.4

## 2019-01-14 NOTE — Progress Notes (Signed)
SLP Cancellation Note  Patient Details Name: Crystal Moore MRN: [id number] DOB: October 13, 1945   Cancelled treatment:        Pt off floor for procedure/test. Will re-attempt evaluation.    [name] Lorae Roig, MA, CCC-SLP 01/14/2019 10:46 AM

## 2019-01-14 NOTE — Progress Notes (Signed)
EEG completed, results pending. 

## 2019-01-14 NOTE — Progress Notes (Signed)
Pt admitted from ED with stroke diagnosis, pt drowsy but easy to arouse, given ativan prior to MRI, Pt settled in bed with call light at bedside, tele monitor put and verified on pt, was however reassured and will continue to monitor, safety measures initiated accordingly, v/s stable. [place]-Asidi, Brandis Wixted Efe

## 2019-01-14 NOTE — Progress Notes (Signed)
PT Cancellation Note  Patient Details Name: Crystal Moore MRN: [id number] DOB: Jun 16, 1946   Cancelled Treatment:      Reason Eval/Treat Not Completed: Patient's level of consciousness(Pt not medically ready as she is unable to fully awake.) PT to follow-up this afternoon if time permits and patient is appropriate or on  01/15/2019. Carney Living PT DPT  01/14/2019, 12:09 PM

## 2019-01-14 NOTE — Procedures (Signed)
  [place] A. Merlene Laughter, MD     [email or web address]           HISTORY: This is a 73 year old female who presents with altered mental status with unclear etiology.  The study is being done to evaluate for seizures as the etiology.  MEDICATIONS:  Current Facility-Administered Medications:  .  acetaminophen (TYLENOL) tablet 650 mg, 650 mg, Oral, Q4H PRN **OR** acetaminophen (TYLENOL) solution 650 mg, 650 mg, Per Tube, Q4H PRN **OR** acetaminophen (TYLENOL) suppository 650 mg, 650 mg, Rectal, Q4H PRN, Rise Patience, MD .  albuterol (PROVENTIL) (2.5 MG/3ML) 0.083% nebulizer solution 2.5 mg, 2.5 mg, Inhalation, Q4H PRN, Rise Patience, MD .  aspirin EC tablet 81 mg, 81 mg, Oral, Daily, Rise Patience, MD, 81 mg at 01/14/19 1127 .  atorvastatin (LIPITOR) tablet 80 mg, 80 mg, Oral, q1800, Rise Patience, MD .  clopidogrel (PLAVIX) tablet 75 mg, 75 mg, Oral, Daily, Rise Patience, MD, 75 mg at 01/14/19 1127 .  dextrose 5 % and 0.9 % NaCl with KCl 20 mEq/L infusion, , Intravenous, Continuous, Vann, Jessica U, DO, Last Rate: 50 mL/hr at 01/14/19 1114 .  enoxaparin (LOVENOX) injection 40 mg, 40 mg, Subcutaneous, Daily, Rise Patience, MD, 40 mg at 01/14/19 1127 .  insulin aspart (novoLOG) injection 0-9 Units, 0-9 Units, Subcutaneous, TID WC, Rise Patience, MD     ANALYSIS: A 16 channel recording using standard 10 20 measurements is conducted for 23 minutes.   There is a well-formed posterior dominant rhythm of 12 hertz which attenuates with eye-opening.  There is beta activity observed in the frontal areas.  Awake and drowsy architecture are observed.  There is normal myogenic interference noted.  Photic stimulation and hyperventilation are not conducted.  There is no focal or lateralized slowing.  There is no epileptiform activity is noted.   IMPRESSION: 1.   This is a normal recording of awake and drowsy states.      Jmari Pelc A.  Merlene Laughter, M.D.  Diplomate, Tax adviser of Psychiatry and Neurology ( Neurology). For

## 2019-01-14 NOTE — Progress Notes (Signed)
Progress Note    Crystal Moore  GMW:[id number] DOB: [birth year]  DOA: 01/13/2019 PCP: [name], MD    Brief Narrative:     Medical records reviewed and are as summarized below:  Crystal Moore is an 73 y.o. female with history of CVA admitted last month for acute CVA with left-sided hemiparesis, diabetes mellitus, seizure not on antiepileptics, COPD was found to be acutely confused by patient's daughter.  Patient's daughter states that she left home early in the morning around 2 and came back around 4:30 PM when patient's mother was found to be confused and has had difficulty speaking.  Patient has known history of left-sided hemiparesis.  Patient was brought to the ER.  Assessment/Plan:   Principal Problem:   Acute encephalopathy Active Problems:   COPD (chronic obstructive pulmonary disease) (HCC)   Hypertension   TIA (transient ischemic attack)   Type 2 diabetes mellitus with vascular disease (HCC)  anterograde memory loss and confusion. MRI brain demonstrates a right ACA distribution infarction, representing extension of the infarction seen on the prior MRI dated 12/17/18. -neurology consult -ASA/plavix/statin -MRA brain pending -PT/OT  Acute renal failure  -resolved  Diabetes mellitus type 2 uncontrolled -mildly hypoglycemic.  -holding off patient's Lantus and closely follow CBGs.  -small amount of d5  -If patient starts eating and blood sugar gets better restart Lantus.  Hemoglobin A1c was around 12 last month. -daughter reports that since prior discharge her blood sugars have been better  COPD  -PRN albuterol.  History of seizures -eeg pending   Family Communication/Anticipated D/C date and plan/Code Status   DVT prophylaxis: Lovenox ordered. Code Status: Full Code.  Family Communication: spoke with daughter Disposition Plan: pending further work up-- including PT/OT   Medical Consultants:    Neurology     Subjective:   Got  ativan last PM for the MRI- still very sleepy  Objective:    Vitals:   01/14/19 0400 01/14/19 0600 01/14/19 0740 01/14/19 1128  BP: 122/77 126/62 133/73 109/80  Pulse: 78 85 69 71  Resp: 20 18 16 16   Temp: 98.3 F (36.8 C) 98.1 F (36.7 C) (!) 96.6 F (35.9 C) 98.1 F (36.7 C)  TempSrc: Oral Oral Axillary Oral  SpO2: 99% 95% 100% 95%  Weight:      Height:        Intake/Output Summary (Last 24 hours) at 01/14/2019 1315 Last data filed at 01/14/2019 0328 Gross per 24 hour  Intake 223.78 ml  Output -  Net 223.78 ml   Filed Weights   01/13/19 1824 01/14/19 0016  Weight: 60.8 kg 54.2 kg    Exam: In bed, sleepy-- will briefly respond to sternal rub Following commands with left hand rrr No increased work of breathing  Data Reviewed:   I have personally reviewed following labs and imaging studies:  Labs: Labs show the following:   Basic Metabolic Panel: Recent Labs  Lab 01/13/19 1930 01/14/19 0617  NA 139 138  K 4.1 3.5  CL 100 104  CO2 24 25  GLUCOSE 86 106*  BUN 24* 25*  CREATININE 1.31* 0.86  CALCIUM 9.2 8.6*  MG 1.4*  --    GFR Estimated Creatinine Clearance: 46.8 mL/min (by C-G formula based on SCr of 0.86 mg/dL). Liver Function Tests: Recent Labs  Lab 01/13/19 1930 01/14/19 0617  AST 24 19  ALT 21 17  ALKPHOS 84 74  BILITOT 0.6 0.6  PROT 7.0 6.1*  ALBUMIN 3.6 3.0*  No results for input(s): LIPASE, AMYLASE in the last 168 hours. Recent Labs  Lab 01/13/19 1930  AMMONIA 28   Coagulation profile Recent Labs  Lab 01/13/19 1930  INR 1.1    CBC: Recent Labs  Lab 01/13/19 1930 01/14/19 0617  WBC 11.3* 8.9  NEUTROABS 7.3  --   HGB 13.6 12.1  HCT 42.3 37.2  MCV 84.4 82.5  PLT 247 218   Cardiac Enzymes: Recent Labs  Lab 01/13/19 1930  TROPONINI <0.03   BNP (last 3 results) No results for input(s): PROBNP in the last 8760 hours. CBG: Recent Labs  Lab 01/14/19 0131 01/14/19 0154 01/14/19 0411 01/14/19 0605 01/14/19  1133  GLUCAP 48* 143* 97 93 84   D-Dimer: No results for input(s): DDIMER in the last 72 hours. Hgb A1c: No results for input(s): HGBA1C in the last 72 hours. Lipid Profile: No results for input(s): CHOL, HDL, LDLCALC, TRIG, CHOLHDL, LDLDIRECT in the last 72 hours. Thyroid function studies: Recent Labs    01/14/19 0950  TSH 0.458   Anemia work up: No results for input(s): VITAMINB12, FOLATE, FERRITIN, TIBC, IRON, RETICCTPCT in the last 72 hours. Sepsis Labs: Recent Labs  Lab 01/13/19 1930 01/14/19 0617  WBC 11.3* 8.9    Microbiology Recent Results (from the past 240 hour(s))  SARS Coronavirus 2     Status: None   Collection Time: 01/13/19 11:47 PM  Result Value Ref Range Status   SARS Coronavirus 2 NOT DETECTED NOT DETECTED Final    Comment: (NOTE) SARS-CoV-2 target nucleic acids are NOT DETECTED. The SARS-CoV-2 RNA is generally detectable in upper and lower respiratory specimens during the acute phase of infection.  Negative  results do not preclude SARS-CoV-2 infection, do not rule out co-infections with other pathogens, and should not be used as the sole basis for treatment or other patient management decisions.  Negative results must be combined with clinical observations, patient history, and epidemiological information. The expected result is Not Detected. Fact Sheet for Patients: http://www.biofiredefense.com/wp-content/uploads/2020/03/BIOFIRE-COVID -19-patients.pdf Fact Sheet for Healthcare Providers: http://www.biofiredefense.com/wp-content/uploads/2020/03/BIOFIRE-COVID -19-hcp.pdf This test is not yet approved or cleared by the [place] and  has been authorized for detection and/or diagnosis of SARS-CoV-2 by FDA under an Emergency Use Authorization (EUA).  This EUA will remain in effec t (meaning this test can be used) for the duration of  the COVID-19 declaration under Section 564(b)(1) of the Act, 21 U.S.C. section 360bbb-3(b)(1), unless the  authorization is terminated or revoked sooner. Performed at [facility], [place] [address] [place], [place] [address]     Procedures and diagnostic studies:  Mr Angio Head Wo Contrast  Result Date: 01/14/2019 CLINICAL DATA:  Right anterior cerebral artery stroke. EXAM: MRA HEAD WITHOUT CONTRAST TECHNIQUE: Angiographic images of the Circle of Willis were obtained using MRA technique without intravenous contrast. COMPARISON:  MRI head 01/13/2019 FINDINGS: Image quality degraded by significant motion. Atherosclerotic irregularity and moderate stenosis of the cavernous carotid bilaterally. Moderate stenosis right A1 segment. Right A2 segment patent with mild irregularity. Occlusion of the inferior division of the right MCA. Moderately severe stenosis of the superior division of the right MCA Left anterior cerebral artery patent without significant stenosis. Severe stenosis left MCA bifurcation with severe disease in the inferior branch and moderate to severe stenosis in the superior branch. Right vertebral artery dominant and patent. Small left vertebral artery contributes to the basilar. Misregistration of the basilar without significant stenosis. Moderate stenosis left P1 segment and occlusion of the left P3  segment. Mild stenosis right posterior cerebral artery. IMPRESSION: Image quality degraded by significant motion Severe intracranial atherosclerotic disease as above. Of note, there is a moderately severe stenosis of the right A1 segment. Remainder of the right anterior cerebral artery patent. Electronically Signed   By: Franchot Gallo M.D.   On: 01/14/2019 11:09   Mr Brain Wo Contrast  Result Date: 01/13/2019 CLINICAL DATA:  73 y/o  F; altered mental status. EXAM: MRI HEAD WITHOUT CONTRAST TECHNIQUE: Multiplanar, multiecho pulse sequences of the brain and surrounding structures were obtained without intravenous contrast. COMPARISON:  12/17/2018 CT head and MRI head.  12/18/2018 CTA head.  FINDINGS: Brain: Patchy right ACA distribution infarction with a small focus of petechial hemorrhage in the right frontal lobe, T2 FLAIR hyperintense signal abnormality, and mild local mass effect. The distribution of the infarct is increased in comparison with prior MRI of the head and there are new areas of reduced diffusion indicating interval expansion/recrudescence of the prior infarction. Very small chronic infarctions are present within the bilateral cerebellar hemispheres, the pons, and throughout the right greater than left basal ganglia as well as corona radiata. There are large confluent nonspecific T2 FLAIR hyperintensities in subcortical and periventricular white matter are compatible with severe chronic microvascular ischemic changes. There is severe volume loss of the brain. No extra-axial collection, hydrocephalus, or herniation. Vascular: Normal flow voids. Skull and upper cervical spine: Normal marrow signal. Sinuses/Orbits: Negative. Other: Bilateral intra-ocular lens replacement. IMPRESSION: 1. Right ACA distribution infarction with a small focus of age-indeterminate petechial hemorrhage in the right frontal lobe. The area of infarction is increased in size from the 12/17/2018 MRI and there are new areas of reduced diffusion indicating interval ischemia. 2. Background of severe chronic microvascular ischemic changes and volume loss of the brain. Multiple very small chronic infarctions within cerebellum, brainstem, and basal ganglia. These results were called by telephone at the time of interpretation on 01/13/2019 at 11:08 pm to Dr. Lonzo Candy , who verbally acknowledged these results. Electronically Signed   By: Kristine Garbe M.D.   On: 01/13/2019 23:10   Dg Chest Portable 1 View  Result Date: 01/13/2019 CLINICAL DATA:  Altered mental status today. EXAM: PORTABLE CHEST 1 VIEW COMPARISON:  CT chest 02/15/2018. Single-view of the chest 12/17/2018. FINDINGS: The lungs are  emphysematous with coarsening of the pulmonary interstitium. Calcified granuloma right lower lobe noted. Lungs otherwise clear. Heart size normal. Atherosclerosis is seen. No pneumothorax or pleural fluid. No acute or focal bony abnormality. IMPRESSION: No acute disease. Emphysema. Atherosclerosis. Electronically Signed   By: Inge Rise M.D.   On: 01/13/2019 19:24    Medications:   . aspirin EC  81 mg Oral Daily  . atorvastatin  80 mg Oral q1800  . clopidogrel  75 mg Oral Daily  . enoxaparin (LOVENOX) injection  40 mg Subcutaneous Daily  . insulin aspart  0-9 Units Subcutaneous TID WC   Continuous Infusions: . dextrose 5 % and 0.9 % NaCl with KCl 20 mEq/L 50 mL/hr at 01/14/19 1114     LOS: 0 days   Geradine Girt  [facility]   How to contact the [facility] Attending or Consulting provider [place] or covering provider during after hours [place], for this patient?  1. Check the care team in [facility] and look for a) attending/consulting [facility] provider listed and b) the [facility] team listed 2. Log into [email or web address] and use [place]'s universal password to access. If you do not have the password,  please contact the hospital operator. 3. Locate the [facility] provider you are looking for under [facility] and page to a number that you can be directly reached. 4. If you still have difficulty reaching the provider, please page the [facility] (Director on Call) for the Hospitalists listed on amion for assistance.  01/14/2019, 1:15 PM

## 2019-01-14 NOTE — Progress Notes (Signed)
CRITICAL VALUE ALERT  Critical Value:  CBG 48  Date & Time Notied:  01/14/2019 AT 0131  Provider Notified: NP X. Blount  Orders Received/Actions taken: Awaiting Response, D50 given, rechecked at 0150, read 143

## 2019-01-14 NOTE — Progress Notes (Signed)
STROKE TEAM PROGRESS NOTE   HISTORY OF PRESENT ILLNESS (per Dr Cheral Marker) Crystal Moore is an 73 y.o. female who was recently hospitalized in May for an acute stroke with left sided hemiparesis, presenting on Friday evening with acute onset of confusion. LKN was at 2 AM when daughter left the home. When she returned at 4:30 PM the patient was confused with difficulty speaking. She was brought to the ED where Cr was noted to be elevated relative to her baseline. She was able to follow commands. MRI was obtained, revealing subacute extension of previously seen acute/subacute right ACA ischemic infarction (seen on her MRI performed in May).   Her stroke risk factors include prior stroke, smoking and DM. Of note, she has a history    SUBJECTIVE (INTERVAL HISTORY) I have personally reviewed patient's history of presenting illness in details.  She was recently admitted with right hemispheric infarct 3 weeks ago.  She was readmitted this time with increasing confusion and worsening deficits.  MRI scan shows slight evolution of her recent right sided strokes without any new definite stroke.  She did have some worsening renal function and slightly low sugar on admission which may have contributed.    OBJECTIVE Vitals:   01/14/19 0400 01/14/19 0600 01/14/19 0740 01/14/19 1128  BP: 122/77 126/62 133/73 109/80  Pulse: 78 85 69 71  Resp: 20 18 16 16   Temp: 98.3 F (36.8 C) 98.1 F (36.7 C) (!) 96.6 F (35.9 C) 98.1 F (36.7 C)  TempSrc: Oral Oral Axillary Oral  SpO2: 99% 95% 100% 95%  Weight:      Height:        CBC:  Recent Labs  Lab 01/13/19 1930 01/14/19 0617  WBC 11.3* 8.9  NEUTROABS 7.3  --   HGB 13.6 12.1  HCT 42.3 37.2  MCV 84.4 82.5  PLT 247 623    Basic Metabolic Panel:  Recent Labs  Lab 01/13/19 1930 01/14/19 0617  NA 139 138  K 4.1 3.5  CL 100 104  CO2 24 25  GLUCOSE 86 106*  BUN 24* 25*  CREATININE 1.31* 0.86  CALCIUM 9.2 8.6*  MG 1.4*  --     Lipid Panel:      Component Value Date/Time   CHOL 166 12/18/2018 0355   TRIG 92 12/18/2018 0355   HDL 48 12/18/2018 0355   CHOLHDL 3.5 12/18/2018 0355   VLDL 18 12/18/2018 0355   LDLCALC 100 (H) 12/18/2018 0355   HgbA1c:  Lab Results  Component Value Date   HGBA1C 12.3 (H) 12/18/2018   Urine Drug Screen: No results found for: LABOPIA, COCAINSCRNUR, LABBENZ, AMPHETMU, THCU, LABBARB  Alcohol Level No results found for: [facility]  IMAGING   Mr Angio Head Wo Contrast 01/14/2019 IMPRESSION:  Image quality degraded by significant motion Severe intracranial atherosclerotic disease as above. Of note, there is a moderately severe stenosis of the right A1 segment. Remainder of the right anterior cerebral artery patent.  Mr Brain Wo Contrast 01/13/2019 IMPRESSION:  1. Right ACA distribution infarction with a small focus of age-indeterminate petechial hemorrhage in the right frontal lobe. The area of infarction is increased in size from the 12/17/2018 MRI and there are new areas of reduced diffusion indicating interval ischemia.  2. Background of severe chronic microvascular ischemic changes and volume loss of the brain. Multiple very small chronic infarctions within cerebellum, brainstem, and basal ganglia.   Dg Chest Portable 1 View 01/13/2019 IMPRESSION:  No acute disease. Emphysema. Atherosclerosis.   Transthoracic  Echocardiogram  See 12/18/18 study    Bilateral Carotid Dopplers  See 12/18/18 study   EKG - ST rate 102 BPM. (See cardiology reading for complete details)    PHYSICAL EXAM Blood pressure 109/80, pulse 71, temperature 98.1 F (36.7 C), temperature source Oral, resp. rate 16, height 5\' 2"  (1.575 m), weight 54.2 kg, SpO2 95 %. Frail elderly African-American lady not in distress.  Lying comfortably in bed. . Afebrile. Head is nontraumatic. Neck is supple without bruit.    Cardiac exam no murmur or gallop. Lungs are clear to auscultation. Distal pulses are well felt. Neurological Exam :   Awake alert oriented to person only.  Diminished attention, registration and recall.  Speech is hesitant and just occasional words and short sentences.  Follows simple midline commands only.  Eyes in primary position.  Blinks to threat on the right but not on the left.  Left lower facial weakness.  Tongue midline.  Spastic left hemiplegia with left upper extremity non-fixed contractures at the left wrist and hand.  Grade 1-2/5 left upper extremity and 2/5 left lower extremity strength.  Purposeful antigravity movements on the right side.    ASSESSMENT/PLAN Crystal Moore is a 73 y.o. female with history of previous strokes, seizures, DM, tobacco use, and COPD presenting with AMS and speech difficulties. She did not receive IV t-PA due to recent stroke.  Stroke: right ACA distribution infarction with a small focus of age-indeterminate petechial hemorrhage in the right frontal lobe - right A1 segment disease  Resultant worsening speech and left-sided weakness  CT head - not performed  MRI head - Right ACA distribution infarction with a small focus of age-indeterminate petechial hemorrhage in the right frontal lobe. The area of infarction is increased in size from the 12/17/2018 MRI and there are new areas of reduced diffusion indicating interval ischemia.   MRA head - Severe intracranial atherosclerotic disease. Of note, there is a moderately severe stenosis of the right A1 segment.  CTA H&N - not performed  Carotid Doppler - see 5/24 - Left vertebral artery demonstrates an occlusion.  2D Echo - see 5/24  - EF 50 - 55%. No cardiac source of emboli identified.   EEG - pending  Lacey Jensen Virus 2 - not detected  LDL - 100 on 12/18/2018  HgbA1c - 12.3 on 12/18/2018  UDS - not performed  VTE prophylaxis - Lovenox  Diet - Heart healthy / carb modified with thin liquids.  aspirin 81 mg daily and clopidogrel 75 mg daily prior to admission, now on aspirin 81 mg daily and clopidogrel  75 mg daily  Patient counseled to be compliant with her antithrombotic medications  Ongoing aggressive stroke risk factor management  Therapy recommendations:  pending  Disposition:  Pending  Hypertension  Stable . Permissive hypertension (OK if < 220/120) but gradually normalize in 5-7 days . Long-term BP goal normotensive  Hyperlipidemia  Lipid lowering medication PTA: Lipitor 80 mg daily  LDL 100 on 12/18/2018, goal < 70  Current lipid lowering medication: Lipitor 80 mg daily  Continue statin at discharge  Diabetes  HgbA1c 12.3 on 12/18/2018 , goal < 7.0  Uncontrolled  Other Stroke Risk Factors  Advanced age  Cigarette smoker - advised to stop smoking  Hx stroke/TIA   Other Active Problems  Hypomagnesemia - supplemented    Hospital day # 0 I have personally obtained history,examined this patient, reviewed notes, independently viewed imaging studies, participated in medical decision making and plan of care.ROS completed  by me personally and pertinent positives fully documented  I have made any additions or clarifications directly to the above note.  The patient had a recent large right hemispheric infarct with residual speech difficulties and spastic left hemiplegia.  She came with worsening of her speech difficulties and MRI shows some interval evolution of the large right ACA infarct I am not sure that necessarily can explain her worsening.  Recommend look for other reversible etiologies like worsening kidney function, low sugar, urinary tract infection and dehydration.  No need to do further stroke work-up as she had a complete work-up recently.  Continue aspirin.  Long discussion with Dr. Lucianne Lei and answered questions.  Greater than 50% time during this 35-minute visit was spent in counseling and coordination of care about her recent strokes and discussion with care team  Antony Contras, MD Medical Director [place] Pager: [phone] 01/14/2019  2:42 PM   To contact Stroke Continuity provider, please refer to [email or web address] After hours, contact General Neurology

## 2019-01-14 NOTE — Progress Notes (Signed)
   01/14/19 0200  Provider Notification  Provider Name/Title NP Jeannette Corpus  Date Provider Notified 01/14/19  Time Provider Notified [phone]  Notification Type Page  Notification Reason Other (Comment) (CBG read 48)  Response Other (Comment) (waiting response)

## 2019-01-14 NOTE — Consult Note (Signed)
NEURO HOSPITALIST CONSULT NOTE   Requestig physician: Dr. Hal Hope  Reason for Consult: Confusion  History obtained from:    Chart      HPI:                                                                                                                                          Crystal Moore is an 73 y.o. female who was recently hospitalized in May for an acute stroke with left sided hemiparesis, presenting on Friday evening with acute onset of confusion. LKN was at 2 AM when daughter left the home. When she returned at 4:30 PM the patient was confused with difficulty speaking. She was brought to the ED where Cr was noted to be elevated relative to her baseline. She was able to follow commands. MRI was obtained, revealing subacute extension of previously seen acute/subacute right ACA ischemic infarction (seen on her MRI performed in May).   Her stroke risk factors include prior stroke, smoking and DM. Of note, she has a history of seizures but is not on an anticonvulsant at home.   Past Medical History:  Diagnosis Date  . Arthritis   . COPD (chronic obstructive pulmonary disease) ([place])   . CVA (cerebral vascular accident) ([place])   . Diabetes ([place])   . Diabetes mellitus without complication ([place])   . Hemiparesis affecting left side as late effect of cerebrovascular accident (CVA) ([place])   . Pyelonephritis   . Seizures ([place])   . Stroke ([place])   . Tobacco use   . Tobacco use disorder   . UTI (urinary tract infection)     Past Surgical History:  Procedure Laterality Date  . TUBAL LIGATION      Family History  Problem Relation Age of Onset  . Diabetes Mother   . Diabetes Sister   . Diabetes Brother               Social History:  reports that she has been smoking cigarettes. She has a 40.00 pack-year smoking history. She has never used smokeless tobacco. She reports that she does not drink alcohol or use drugs.  No Known Allergies  MEDICATIONS:  Prior to Admission:  Medications Prior to Admission  Medication Sig Dispense Refill Last Dose  . aspirin EC 81 MG tablet Take 1 tablet (81 mg total) by mouth daily. 150 tablet 2 01/13/2019 at 0930  . atorvastatin (LIPITOR) 80 MG tablet Take 1 tablet (80 mg total) by mouth daily at 6 PM. 30 tablet 0 01/12/2019 at pm  . clopidogrel (PLAVIX) 75 MG tablet Take 1 tablet (75 mg total) by mouth daily. 30 tablet 0 01/13/2019 at 0930  . LANTUS 100 UNIT/ML injection Take 15 Units by mouth daily after breakfast.    01/13/2019 at am  . metFORMIN (GLUCOPHAGE) 500 MG tablet Take 1,000 mg by mouth 2 (two) times a day.    01/13/2019 at am  . nicotine (NICODERM CQ - DOSED IN MG/24 HOURS) 21 mg/24hr patch Place 21 mg onto the skin daily.   01/12/2019 at pm  . nicotine (NICOTROL) 10 MG inhaler Inhale 1 Cartridge (1 continuous puffing total) into the lungs as needed for smoking cessation. (Patient not taking: Reported on 01/13/2019) 30 each 0 Not Taking at Unknown time   Scheduled: . aspirin EC  81 mg Oral Daily  . atorvastatin  80 mg Oral q1800  . clopidogrel  75 mg Oral Daily  . enoxaparin (LOVENOX) injection  40 mg Subcutaneous Daily  . insulin aspart  0-9 Units Subcutaneous TID WC     ROS:                                                                                                                                       Unable to obtain due to residual sedation.   Blood pressure (!) 167/85, pulse 73, temperature 98.1 F (36.7 C), temperature source Oral, resp. rate 18, weight 60.8 kg, SpO2 97 %.   General Examination:                                                                                                       Physical Exam  HEENT-  [place]/AT   Lungs- Respirations unlabored Extremities- No edema  Neurological Examination Mental Status: Somnolent/sedated following Ativan for MRI. Will open eyes  briefly after sustained sternal rub. Will briefly gaze at examiner. Not following commands. Will grimace to noxious. Nonverbal.  Cranial Nerves: II: Will briefly gaze at examiner after sternal rub. Does not blink to threat. PERRL.  III,IV, VI: No ptosis. Eyes conjugate when gazing at examiner. No nystagmus. Partially suppressed doll's eye reflex noted due to somnolence.  V,VII:  Left facial droop. Unable to formally test facial sensation.  VIII: Unable to test IX,X: Does not open mouth for visualization of palate XI: No gross asymmetry but does not shrug shoulders XII: Unable to assess tongue protrusion Motor/Sensory: Both upper extremities drop to bed when passively elevated and released (somnolent)  Increased flexor tone LUE. Moves slightly with 2/5 strength to sternal rub and arm pinch (sedated) RUE with normal tone. Moves semipurposefully to sternal rub and arm pinch.  Withdraws BLE to plantar stimulation, right more briskly than left.  Deep Tendon Reflexes: 2+ right biceps and brachioradialis. 3+ left biceps and brachioradialis. 3+ bilateral patellae, brisker on left.  Plantars:  Right: downgoing   Left: upgoing Cerebellar/Gait: Unable to assess   Lab Results: Basic Metabolic Panel: Recent Labs  Lab 01/13/19 1930  NA 139  K 4.1  CL 100  CO2 24  GLUCOSE 86  BUN 24*  CREATININE 1.31*  CALCIUM 9.2  MG 1.4*    CBC: Recent Labs  Lab 01/13/19 1930  WBC 11.3*  NEUTROABS 7.3  HGB 13.6  HCT 42.3  MCV 84.4  PLT 247    Cardiac Enzymes: Recent Labs  Lab 01/13/19 1930  TROPONINI <0.03    Lipid Panel: No results for input(s): CHOL, TRIG, HDL, CHOLHDL, VLDL, LDLCALC in the last 168 hours.  Imaging: Mr Brain Wo Contrast  Result Date: 01/13/2019 CLINICAL DATA:  73 y/o  F; altered mental status. EXAM: MRI HEAD WITHOUT CONTRAST TECHNIQUE: Multiplanar, multiecho pulse sequences of the brain and surrounding structures were obtained without intravenous contrast. COMPARISON:   12/17/2018 CT head and MRI head.  12/18/2018 CTA head. FINDINGS: Brain: Patchy right ACA distribution infarction with a small focus of petechial hemorrhage in the right frontal lobe, T2 FLAIR hyperintense signal abnormality, and mild local mass effect. The distribution of the infarct is increased in comparison with prior MRI of the head and there are new areas of reduced diffusion indicating interval expansion/recrudescence of the prior infarction. Very small chronic infarctions are present within the bilateral cerebellar hemispheres, the pons, and throughout the right greater than left basal ganglia as well as corona radiata. There are large confluent nonspecific T2 FLAIR hyperintensities in subcortical and periventricular white matter are compatible with severe chronic microvascular ischemic changes. There is severe volume loss of the brain. No extra-axial collection, hydrocephalus, or herniation. Vascular: Normal flow voids. Skull and upper cervical spine: Normal marrow signal. Sinuses/Orbits: Negative. Other: Bilateral intra-ocular lens replacement. IMPRESSION: 1. Right ACA distribution infarction with a small focus of age-indeterminate petechial hemorrhage in the right frontal lobe. The area of infarction is increased in size from the 12/17/2018 MRI and there are new areas of reduced diffusion indicating interval ischemia. 2. Background of severe chronic microvascular ischemic changes and volume loss of the brain. Multiple very small chronic infarctions within cerebellum, brainstem, and basal ganglia. These results were called by telephone at the time of interpretation on 01/13/2019 at 11:08 pm to Dr. Lonzo Candy , who verbally acknowledged these results. Electronically Signed   By: Kristine Garbe M.D.   On: 01/13/2019 23:10   Dg Chest Portable 1 View  Result Date: 01/13/2019 CLINICAL DATA:  Altered mental status today. EXAM: PORTABLE CHEST 1 VIEW COMPARISON:  CT chest 02/15/2018. Single-view of  the chest 12/17/2018. FINDINGS: The lungs are emphysematous with coarsening of the pulmonary interstitium. Calcified granuloma right lower lobe noted. Lungs otherwise clear. Heart size normal. Atherosclerosis is seen. No pneumothorax or pleural fluid. No acute or focal bony abnormality. IMPRESSION:  No acute disease. Emphysema. Atherosclerosis. Electronically Signed   By: Inge Rise M.D.   On: 01/13/2019 19:24   Carotid ultrasound, 12/18/18: Right Carotid: Velocities in the right ICA are consistent with a 1-39% stenosis.                Unable to match values found in the CTA neck performed on                12/18/18, likely due to poor visualization of calcified arteries. Left Carotid: Velocities in the left ICA are consistent with a 1-39% stenosis.               Unable to match values found in the CTA neck performed on 12/18/18,               likely due to poor visualization of calcified arteries. Vertebrals:  Right vertebral artery demonstrates retrograde flow. Left vertebral              artery demonstrates no discernable flow. Left vertebral artery              demonstrates an occlusion. Subclavians: Normal flow hemodynamics were seen in the right subclavian artery.  Left antegrade flow.  Echocardiogram, 12/18/18: 1. The left ventricle has low normal systolic function, with an ejection fraction of 50-55%. The cavity size was normal. There is mildly increased left ventricular wall thickness. Left ventricular diastolic Doppler parameters are consistent with impaired relaxation.  2. Left atrial size was mildly dilated.  3. The mitral valve is grossly normal.  4. The tricuspid valve is grossly normal.  5. The aortic valve is tricuspid. Mild thickening of the aortic valve. No stenosis of the aortic valve.  6. Normal LV systolic function; mild diastolic dysfunction; mild LVH; mild LAE.  Assessment: 73 year old female presenting with anterograde memory loss and confusion. MRI brain demonstrates  a right ACA distribution infarction, representing extension of the infarction seen on the prior MRI dated 12/17/18. 1. Exam in the somnolent/sedated state relatively uninformative regarding potential new lesion. Left sided UMN findings are most consistent with an old stroke.  2. MRI brain: Right ACA distribution infarction with a small focus of age-indeterminate petechial hemorrhage in the right frontal lobe. The area of infarction is increased in size from the 12/17/2018 MRI and there are new areas of reduced diffusion indicating interval ischemia. Background of severe chronic microvascular ischemic changes and volume loss of the brain. Multiple very small chronic infarctions within cerebellum, brainstem, and basal ganglia. 3. TTE and carotid ultrasound have been performed within the last month, with results documented above. No mural thrombus on TTE. Doppler showed occlusion of left vertebral and 1-39% stenoses of the ICAs.  4. Stroke risk factors: Prior stroke, smoking and DM.  Recommendations: 1. MRA head 2. Cardiac telemetry 3. Continue ASA, Plavix and atorvastatin 4. PT/OT/Speech 5. BP management.     Electronically signed: Dr. Kerney Elbe 01/14/2019, 12:23 AM

## 2019-01-14 NOTE — Progress Notes (Signed)
OT Cancellation Note  Patient Details Name: Crystal Moore MRN: [id number] DOB: 07-Dec-1945   Cancelled Treatment:    Reason Eval/Treat Not Completed: Patient's level of consciousness(Pt not medically ready as she is unable to fully awake.)  OT to follow-up next available treatment day.  Ebony Hail Harold Hedge) Marsa Aris OTR/L Acute Rehabilitation Services Pager: [phone] Office: [place] 01/14/2019, 9:46 AM

## 2019-01-14 NOTE — Progress Notes (Signed)
Pt observed not to have voided since admission, bladder scan done at 0415, scanned a volume of 211, pt assisted unto the [facility], still no void, pt verbalized that she did not have the urge to void at this time, pt assisted back to bed, CBG read 97, fed pt with a cup of apple sauce,  will continue to monitor. [place]-Asidi, Esra Frankowski Efe

## 2019-01-15 DIAGNOSIS — N179 Acute kidney failure, unspecified: Secondary | ICD-10-CM

## 2019-01-15 DIAGNOSIS — I6330 Cerebral infarction due to thrombosis of unspecified cerebral artery: Secondary | ICD-10-CM | POA: Insufficient documentation

## 2019-01-15 DIAGNOSIS — R531 Weakness: Secondary | ICD-10-CM

## 2019-01-15 DIAGNOSIS — I10 Essential (primary) hypertension: Secondary | ICD-10-CM

## 2019-01-15 LAB — CBC
HCT: 38.1 % (ref 36.0–46.0)
Hemoglobin: 12.2 g/dL (ref 12.0–15.0)
MCH: 26.3 pg (ref 26.0–34.0)
MCHC: 32 g/dL (ref 30.0–36.0)
MCV: 82.1 fL (ref 80.0–100.0)
Platelets: 228 10*3/uL (ref 150–400)
RBC: 4.64 MIL/uL (ref 3.87–5.11)
RDW: 14.4 % (ref 11.5–15.5)
WBC: 7.7 10*3/uL (ref 4.0–10.5)
nRBC: 0 % (ref 0.0–0.2)

## 2019-01-15 LAB — GLUCOSE, CAPILLARY
Glucose-Capillary: 156 mg/dL — ABNORMAL HIGH (ref 70–99)
Glucose-Capillary: 196 mg/dL — ABNORMAL HIGH (ref 70–99)
Glucose-Capillary: 127 mg/dL — ABNORMAL HIGH (ref 70–99)
Glucose-Capillary: 217 mg/dL — ABNORMAL HIGH (ref 70–99)

## 2019-01-15 LAB — BASIC METABOLIC PANEL
Anion gap: 7 (ref 5–15)
BUN: 14 mg/dL (ref 8–23)
CO2: 26 mmol/L (ref 22–32)
Calcium: 8.8 mg/dL — ABNORMAL LOW (ref 8.9–10.3)
Chloride: 107 mmol/L (ref 98–111)
Creatinine, Ser: 0.67 mg/dL (ref 0.44–1.00)
GFR calc Af Amer: >60 mL/min (ref >60)
GFR calc non Af Amer: >60 mL/min (ref >60)
Glucose, Bld: 163 mg/dL — ABNORMAL HIGH (ref 70–99)
Potassium: 4.3 mmol/L (ref 3.5–5.1)
Sodium: 140 mmol/L (ref 135–145)

## 2019-01-15 MED ORDER — TRAZODONE HCL 50 MG PO TABS
50.0000 mg | ORAL_TABLET | Freq: Once | ORAL | Status: AC
  Administered 2019-01-15: 50 mg via ORAL
  Filled 2019-01-15: qty 1

## 2019-01-15 NOTE — Progress Notes (Signed)
Progress Note    Crystal Moore  XNA:[id number] DOB: [birth year]  DOA: 01/13/2019 PCP: [name], MD    Brief Narrative:     Medical records reviewed and are as summarized below:  Crystal Moore is an 73 y.o. female with history of CVA admitted last month for acute CVA with left-sided hemiparesis, diabetes mellitus, seizure not on antiepileptics, COPD was found to be acutely confused by patient's daughter.  Patient's daughter states that she left home early in the morning around 2 and came back around 4:30 PM when patient's mother was found to be confused and has had difficulty speaking.  Patient has known history of left-sided hemiparesis.  Patient was brought to the ER.  Assessment/Plan:   Principal Problem:   Acute encephalopathy Active Problems:   COPD (chronic obstructive pulmonary disease) (HCC)   Hypertension   Hypokalemia   Type 2 diabetes mellitus with vascular disease (HCC)   AKI (acute kidney injury) (HCC)  anterograde memory loss and confusion. MRI brain demonstrates a right ACA distribution infarction, representing extension of the infarction seen on the prior MRI dated 12/17/18. -neurology consult appreciated: The patient had a recent large right hemispheric infarct with residual speech difficulties and spastic left hemiplegia.  She came with worsening of her speech difficulties and MRI shows some interval evolution of the large right ACA infarct I am not sure that necessarily can explain her worsening.  Recommend look for other reversible etiologies like worsening kidney function, low sugar, urinary tract infection and dehydration.  No need to do further stroke work-up as she had a complete work-up recently.  (patient had worsening renal failure as well as low blood sugar at presentation) -ASA/plavix/statin -MRA brain done with motion degradation -PT/OT pending  Acute renal failure  -resolved  Diabetes mellitus type 2 uncontrolled -mildly  hypoglycemic.  -holding off patient's Lantus and closely follow CBGs.  -daughter reports that since prior discharge her blood sugars have been better- may need to loosen control slightly to avoid hypoglycemia  COPD  -PRN albuterol.  History of seizures -eeg normal  HTN -may need BP medication started vs keeping log at home--- BP on higher end  Family Communication/Anticipated D/C date and plan/Code Status   DVT prophylaxis: Lovenox ordered. Code Status: Full Code.  Family Communication: spoke with daughter 6/20 Disposition Plan: pending further work up-- including PT/OT   Medical Consultants:    Neurology     Subjective:   More awake today  Objective:    Vitals:   01/14/19 2012 01/14/19 2312 01/15/19 0430 01/15/19 0741  BP: (!) 142/87 (!) 142/72 (!) 146/67 (!) 171/66  Pulse: 88 81 71 68  Resp: 14 14 14 11   Temp: 98.7 F (37.1 C) 98.4 F (36.9 C) 98.3 F (36.8 C) 98 F (36.7 C)  TempSrc: Oral Oral Oral Oral  SpO2: 93% 95% 98% 100%  Weight:      Height:        Intake/Output Summary (Last 24 hours) at 01/15/2019 5427 Last data filed at 01/15/2019 0443 Gross per 24 hour  Intake 1266.86 ml  Output 1100 ml  Net 166.86 ml   Filed Weights   01/13/19 1824 01/14/19 0016  Weight: 60.8 kg 54.2 kg    Exam: In bed, awake today rrr No increased work of breathing Voice soft, slow at times to respond  Data Reviewed:   I have personally reviewed following labs and imaging studies:  Labs: Labs show the following:   Basic Metabolic Panel: Recent  Labs  Lab 01/13/19 1930 01/14/19 0617 01/15/19 0616  NA 139 138 140  K 4.1 3.5 4.3  CL 100 104 107  CO2 24 25 26   GLUCOSE 86 106* 163*  BUN 24* 25* 14  CREATININE 1.31* 0.86 0.67  CALCIUM 9.2 8.6* 8.8*  MG 1.4*  --   --    GFR Estimated Creatinine Clearance: 50.3 mL/min (by C-G formula based on SCr of 0.67 mg/dL). Liver Function Tests: Recent Labs  Lab 01/13/19 1930 01/14/19 0617  AST 24 19  ALT  21 17  ALKPHOS 84 74  BILITOT 0.6 0.6  PROT 7.0 6.1*  ALBUMIN 3.6 3.0*   No results for input(s): LIPASE, AMYLASE in the last 168 hours. Recent Labs  Lab 01/13/19 1930  AMMONIA 28   Coagulation profile Recent Labs  Lab 01/13/19 1930  INR 1.1    CBC: Recent Labs  Lab 01/13/19 1930 01/14/19 0617 01/15/19 0616  WBC 11.3* 8.9 7.7  NEUTROABS 7.3  --   --   HGB 13.6 12.1 12.2  HCT 42.3 37.2 38.1  MCV 84.4 82.5 82.1  PLT 247 218 228   Cardiac Enzymes: Recent Labs  Lab 01/13/19 1930  TROPONINI <0.03   BNP (last 3 results) No results for input(s): PROBNP in the last 8760 hours. CBG: Recent Labs  Lab 01/14/19 0605 01/14/19 1133 01/14/19 1700 01/14/19 2138 01/15/19 0610  GLUCAP 93 84 124* 195* 156*   D-Dimer: No results for input(s): DDIMER in the last 72 hours. Hgb A1c: No results for input(s): HGBA1C in the last 72 hours. Lipid Profile: No results for input(s): CHOL, HDL, LDLCALC, TRIG, CHOLHDL, LDLDIRECT in the last 72 hours. Thyroid function studies: Recent Labs    01/14/19 0950  TSH 0.458   Anemia work up: No results for input(s): VITAMINB12, FOLATE, FERRITIN, TIBC, IRON, RETICCTPCT in the last 72 hours. Sepsis Labs: Recent Labs  Lab 01/13/19 1930 01/14/19 0617 01/15/19 0616  WBC 11.3* 8.9 7.7    Microbiology Recent Results (from the past 240 hour(s))  SARS Coronavirus 2     Status: None   Collection Time: 01/13/19 11:47 PM  Result Value Ref Range Status   SARS Coronavirus 2 NOT DETECTED NOT DETECTED Final    Comment: (NOTE) SARS-CoV-2 target nucleic acids are NOT DETECTED. The SARS-CoV-2 RNA is generally detectable in upper and lower respiratory specimens during the acute phase of infection.  Negative  results do not preclude SARS-CoV-2 infection, do not rule out co-infections with other pathogens, and should not be used as the sole basis for treatment or other patient management decisions.  Negative results must be combined with  clinical observations, patient history, and epidemiological information. The expected result is Not Detected. Fact Sheet for Patients: http://www.biofiredefense.com/wp-content/uploads/2020/03/BIOFIRE-COVID -19-patients.pdf Fact Sheet for Healthcare Providers: http://www.biofiredefense.com/wp-content/uploads/2020/03/BIOFIRE-COVID -19-hcp.pdf This test is not yet approved or cleared by the [place] and  has been authorized for detection and/or diagnosis of SARS-CoV-2 by FDA under an Emergency Use Authorization (EUA).  This EUA will remain in effec t (meaning this test can be used) for the duration of  the COVID-19 declaration under Section 564(b)(1) of the Act, 21 U.S.C. section 360bbb-3(b)(1), unless the authorization is terminated or revoked sooner. Performed at [facility], [place] [address] [place], [place] [address]     Procedures and diagnostic studies:  Mr Angio Head Wo Contrast  Result Date: 01/14/2019 CLINICAL DATA:  Right anterior cerebral artery stroke. EXAM: MRA HEAD WITHOUT CONTRAST TECHNIQUE: Angiographic images of the Circle  of Willis were obtained using MRA technique without intravenous contrast. COMPARISON:  MRI head 01/13/2019 FINDINGS: Image quality degraded by significant motion. Atherosclerotic irregularity and moderate stenosis of the cavernous carotid bilaterally. Moderate stenosis right A1 segment. Right A2 segment patent with mild irregularity. Occlusion of the inferior division of the right MCA. Moderately severe stenosis of the superior division of the right MCA Left anterior cerebral artery patent without significant stenosis. Severe stenosis left MCA bifurcation with severe disease in the inferior branch and moderate to severe stenosis in the superior branch. Right vertebral artery dominant and patent. Small left vertebral artery contributes to the basilar. Misregistration of the basilar without significant stenosis. Moderate stenosis left P1 segment  and occlusion of the left P3 segment. Mild stenosis right posterior cerebral artery. IMPRESSION: Image quality degraded by significant motion Severe intracranial atherosclerotic disease as above. Of note, there is a moderately severe stenosis of the right A1 segment. Remainder of the right anterior cerebral artery patent. Electronically Signed   By: Franchot Gallo M.D.   On: 01/14/2019 11:09   Mr Brain Wo Contrast  Result Date: 01/13/2019 CLINICAL DATA:  73 y/o  F; altered mental status. EXAM: MRI HEAD WITHOUT CONTRAST TECHNIQUE: Multiplanar, multiecho pulse sequences of the brain and surrounding structures were obtained without intravenous contrast. COMPARISON:  12/17/2018 CT head and MRI head.  12/18/2018 CTA head. FINDINGS: Brain: Patchy right ACA distribution infarction with a small focus of petechial hemorrhage in the right frontal lobe, T2 FLAIR hyperintense signal abnormality, and mild local mass effect. The distribution of the infarct is increased in comparison with prior MRI of the head and there are new areas of reduced diffusion indicating interval expansion/recrudescence of the prior infarction. Very small chronic infarctions are present within the bilateral cerebellar hemispheres, the pons, and throughout the right greater than left basal ganglia as well as corona radiata. There are large confluent nonspecific T2 FLAIR hyperintensities in subcortical and periventricular white matter are compatible with severe chronic microvascular ischemic changes. There is severe volume loss of the brain. No extra-axial collection, hydrocephalus, or herniation. Vascular: Normal flow voids. Skull and upper cervical spine: Normal marrow signal. Sinuses/Orbits: Negative. Other: Bilateral intra-ocular lens replacement. IMPRESSION: 1. Right ACA distribution infarction with a small focus of age-indeterminate petechial hemorrhage in the right frontal lobe. The area of infarction is increased in size from the 12/17/2018  MRI and there are new areas of reduced diffusion indicating interval ischemia. 2. Background of severe chronic microvascular ischemic changes and volume loss of the brain. Multiple very small chronic infarctions within cerebellum, brainstem, and basal ganglia. These results were called by telephone at the time of interpretation on 01/13/2019 at 11:08 pm to Dr. Lonzo Candy , who verbally acknowledged these results. Electronically Signed   By: Kristine Garbe M.D.   On: 01/13/2019 23:10   Dg Chest Portable 1 View  Result Date: 01/13/2019 CLINICAL DATA:  Altered mental status today. EXAM: PORTABLE CHEST 1 VIEW COMPARISON:  CT chest 02/15/2018. Single-view of the chest 12/17/2018. FINDINGS: The lungs are emphysematous with coarsening of the pulmonary interstitium. Calcified granuloma right lower lobe noted. Lungs otherwise clear. Heart size normal. Atherosclerosis is seen. No pneumothorax or pleural fluid. No acute or focal bony abnormality. IMPRESSION: No acute disease. Emphysema. Atherosclerosis. Electronically Signed   By: Inge Rise M.D.   On: 01/13/2019 19:24    Medications:   . aspirin EC  81 mg Oral Daily  . atorvastatin  80 mg Oral q1800  . clopidogrel  75  mg Oral Daily  . enoxaparin (LOVENOX) injection  40 mg Subcutaneous Daily  . insulin aspart  0-9 Units Subcutaneous TID WC   Continuous Infusions:    LOS: 1 day   Geradine Girt  [facility]   How to contact the [facility] Attending or Consulting provider [place] or covering provider during after hours [place], for this patient?  1. Check the care team in [facility] and look for a) attending/consulting [facility] provider listed and b) the [facility] team listed 2. Log into [email or web address] and use [place] universal password to access. If you do not have the password, please contact the hospital operator. 3. Locate the [facility] provider you are looking for under [facility] and page to a number that you can be directly reached. 4. If  you still have difficulty reaching the provider, please page the [facility] (Director on Call) for the Hospitalists listed on amion for assistance.  01/15/2019, 9:17 AM

## 2019-01-15 NOTE — Evaluation (Signed)
Occupational Therapy Evaluation Patient Details Name: Crystal Moore MRN: [id number] DOB: 1946-07-16 Today's Date: 01/15/2019    History of Present Illness 73 y.o. female with history of CVA admitted last month for acute CVA with left-sided hemiparesis, diabetes mellitus, seizure not on antiepileptics, COPD was found to be acutely confused with difficulty speaking by patient's daughter. Admitted 6/19 for acute encephalopathy. MRI revealed Right ACA distribution infarction area of infarction is increased in size from the 12/17/2018 MRI    Clinical Impression   Pt PTA: living with daughter. Pt s/p recent CVA leaving L side early flaccid. Pt currently performing stand pivot with maxA +2. LUE remains poor mobility 2/5 MM grade for shoulder elevation. Pt performing UB ADL with set-upA to minA and LB ADL with maxA. Pt nearly mute and smiles- randomly engaging to converse rather than nod or shake head. Pt limited by poor activity tolerance, poor mobility and increased assist needed for ADL. Pt would benefit from continued OT skilled service for ADL, mobility and safety. OT to follow acutely.      Follow Up Recommendations  SNF;Supervision/Assistance - 24 hour    Equipment Recommendations  None recommended by OT    Recommendations for Other Services       Precautions / Restrictions Precautions Precautions: Fall Restrictions Weight Bearing Restrictions: No      Mobility Bed Mobility Overal bed mobility: Needs Assistance Bed Mobility: Supine to Sit     Supine to sit: Min assist     General bed mobility comments: with increased time and effort pt able to move LE off bed, requires minA for trunk to upright and scoot of hips to EoB  Transfers Overall transfer level: Needs assistance Equipment used: 2 person hand held assist   Sit to Stand: +2 physical assistance;Min assist Stand pivot transfers: Mod assist;+2 physical assistance       General transfer comment: placed RW in front  of her but does not exhibit knowledge of correct use, minAx2 for power up to standing, modAx2 required for pivoting due to decreased ability to move L LE, multimodal cuing for sequencing    Balance Overall balance assessment: Needs assistance Sitting-balance support: Feet supported;No upper extremity supported Sitting balance-Leahy Scale: Fair     Standing balance support: Bilateral upper extremity supported;During functional activity Standing balance-Leahy Scale: Zero                             ADL either performed or assessed with clinical judgement   ADL Overall ADL's : Needs assistance/impaired Eating/Feeding: Set up;Sitting   Grooming: Set up;Sitting   Upper Body Bathing: Minimal assistance;Sitting   Lower Body Bathing: Maximal assistance;Sitting/lateral leans;Sit to/from stand   Upper Body Dressing : Minimal assistance;Sitting;Standing   Lower Body Dressing: Maximal assistance;Sitting/lateral leans;Sit to/from stand   Toilet Transfer: Maximal assistance;Stand-pivot;BSC   Toileting- Clothing Manipulation and Hygiene: Maximal assistance;Sitting/lateral lean       Functional mobility during ADLs: Maximal assistance;+2 for physical assistance;Cueing for sequencing General ADL Comments: Pt LUE flaccid and reliant on RUE to perform tasks     Vision         Perception     Praxis      Pertinent Vitals/Pain Pain Assessment: Faces Faces Pain Scale: No hurt     Hand Dominance Right   Extremity/Trunk Assessment Upper Extremity Assessment Upper Extremity Assessment: Overall WFL for tasks assessed LUE Deficits / Details: hemiparesis, no active movement or functionally   Lower Extremity  Assessment Lower Extremity Assessment: Defer to PT evaluation LLE Deficits / Details: able to weight bear for stand pivot   Cervical / Trunk Assessment Cervical / Trunk Assessment: Kyphotic   Communication Communication Communication: Expressive difficulties(sparse  answering of questions)   Cognition Arousal/Alertness: Awake/alert Behavior During Therapy: Flat affect Overall Cognitive Status: Difficult to assess Area of Impairment: Following commands;Problem solving                 Orientation Level: Person     Following Commands: Follows one step commands with increased time;Follows one step commands consistently     Problem Solving: Slow processing;Difficulty sequencing;Requires verbal cues;Requires tactile cues General Comments: pt speaking minimally during session, follows one step commands with increased time    General Comments  VSS    Exercises     Shoulder Instructions      Home Living Family/patient expects to be discharged to:: Private residence Living Arrangements: Children;Other relatives Available Help at Discharge: Family;Available 24 hours/day Type of Home: House Home Access: Stairs to enter [employer] of Steps: 1 Entrance Stairs-Rails: Can reach both Home Layout: One level     Bathroom Shower/Tub: Teacher, early years/pre: Standard     Home Equipment: Cane - single point;Shower seat;Walker - 2 wheels;Hospital bed;Other (comment)(hoyer lift (information gleaned from discharge note prior )   Additional Comments: Pt shaking or nodding head- pt with expressive difficulties. Pt requires assist for ADL due to CVA per chart- Pt came from home with daughter.  Lives With: Daughter(patricia)    Prior Functioning/Environment Level of Independence: Needs assistance  Gait / Transfers Assistance Needed: unsure      Comments: discharge notes last note indicate, HHPT, HHOT, Nurses Aide, RN, SPL        OT Problem List: Decreased strength;Decreased activity tolerance;Impaired balance (sitting and/or standing);Decreased coordination;Decreased safety awareness;Decreased cognition;Impaired UE functional use      OT Treatment/Interventions: Self-care/ADL training;Therapeutic exercise;Neuromuscular  education;Energy conservation;Therapeutic activities;Patient/family education;Balance training;Cognitive remediation/compensation    OT Goals(Current goals can be found in the care plan section) Acute Rehab OT Goals Patient Stated Goal: none stated OT Goal Formulation: With patient Time For Goal Achievement: 01/29/19 Potential to Achieve Goals: Good ADL Goals Pt Will Perform Grooming: with set-up;sitting Pt Will Transfer to Toilet: with min assist;stand pivot transfer Pt/caregiver will Perform Home Exercise Program: Left upper extremity;With written HEP provided;With minimal assist Additional ADL Goal #1: Pt will sit EOB x5 mins with fair balance and ability to use RUE for ADL tasks.  OT Frequency: Min 2X/week   Barriers to D/C: Decreased caregiver support  only family nearby works and pt decreasing mobility status.       Co-evaluation PT/OT/SLP Co-Evaluation/Treatment: Yes Reason for Co-Treatment: Complexity of the patient's impairments (multi-system involvement);To address functional/ADL transfers   OT goals addressed during session: ADL's and self-care      AM-PAC OT "6 Clicks" Daily Activity     Outcome Measure Help from another person eating meals?: A Little Help from another person taking care of personal grooming?: A Lot Help from another person toileting, which includes using toliet, bedpan, or urinal?: Total Help from another person bathing (including washing, rinsing, drying)?: A Lot Help from another person to put on and taking off regular upper body clothing?: A Lot Help from another person to put on and taking off regular lower body clothing?: Total 6 Click Score: 11   End of Session Equipment Utilized During Treatment: Gait belt Nurse Communication: Mobility status  Activity Tolerance: Patient tolerated  treatment well Patient left: in chair;with call bell/phone within reach;with chair alarm set  OT Visit Diagnosis: Unsteadiness on feet (R26.81);Muscle weakness  (generalized) (M62.81);Cognitive communication deficit (R41.841);Hemiplegia and hemiparesis Symptoms and signs involving cognitive functions: Cerebral infarction Hemiplegia - Right/Left: Left Hemiplegia - dominant/non-dominant: Non-Dominant Hemiplegia - caused by: Cerebral infarction                Time: 0941-1000 OT Time Calculation (min): 19 min Charges:  OT General Charges $OT Visit: 1 Visit OT Evaluation $OT Eval Moderate Complexity: 1 Mod  Darryl Nestle) Marsa Aris OTR/L Acute Rehabilitation Services Pager: [phone] Office: [phone]   Traycen Goyer J Binh Doten 01/15/2019, 3:00 PM

## 2019-01-15 NOTE — Progress Notes (Signed)
STROKE TEAM PROGRESS NOTE      SUBJECTIVE (INTERVAL HISTORY) She is sitting up in bed.  She appears less confused and is interactive today.  She continues to have spastic left hemiplegia which is unchanged.  UA is unremarkable.  Chest x-ray shows no active infection.  She is afebrile.  Renal function is improving.  EEG shows no seizure activity.    OBJECTIVE Vitals:   01/14/19 2312 01/15/19 0430 01/15/19 0741 01/15/19 1116  BP: (!) 142/72 (!) 146/67 (!) 171/66 134/86  Pulse: 81 71 68 85  Resp: 14 14 11 13   Temp: 98.4 F (36.9 C) 98.3 F (36.8 C) 98 F (36.7 C) (!) 97.5 F (36.4 C)  TempSrc: Oral Oral Oral Oral  SpO2: 95% 98% 100% 98%  Weight:      Height:        CBC:  Recent Labs  Lab 01/13/19 1930 01/14/19 0617 01/15/19 0616  WBC 11.3* 8.9 7.7  NEUTROABS 7.3  --   --   HGB 13.6 12.1 12.2  HCT 42.3 37.2 38.1  MCV 84.4 82.5 82.1  PLT 247 218 263    Basic Metabolic Panel:  Recent Labs  Lab 01/13/19 1930 01/14/19 0617 01/15/19 0616  NA 139 138 140  K 4.1 3.5 4.3  CL 100 104 107  CO2 24 25 26   GLUCOSE 86 106* 163*  BUN 24* 25* 14  CREATININE 1.31* 0.86 0.67  CALCIUM 9.2 8.6* 8.8*  MG 1.4*  --   --     Lipid Panel:     Component Value Date/Time   CHOL 166 12/18/2018 0355   TRIG 92 12/18/2018 0355   HDL 48 12/18/2018 0355   CHOLHDL 3.5 12/18/2018 0355   VLDL 18 12/18/2018 0355   LDLCALC 100 (H) 12/18/2018 0355   HgbA1c:  Lab Results  Component Value Date   HGBA1C 12.3 (H) 12/18/2018   Urine Drug Screen: No results found for: LABOPIA, COCAINSCRNUR, LABBENZ, AMPHETMU, THCU, LABBARB  Alcohol Level No results found for: [facility]  IMAGING   Mr Angio Head Wo Contrast 01/14/2019 IMPRESSION:  Image quality degraded by significant motion Severe intracranial atherosclerotic disease as above. Of note, there is a moderately severe stenosis of the right A1 segment. Remainder of the right anterior cerebral artery patent.  Mr Brain Wo  Contrast 01/13/2019 IMPRESSION:  1. Right ACA distribution infarction with a small focus of age-indeterminate petechial hemorrhage in the right frontal lobe. The area of infarction is increased in size from the 12/17/2018 MRI and there are new areas of reduced diffusion indicating interval ischemia.  2. Background of severe chronic microvascular ischemic changes and volume loss of the brain. Multiple very small chronic infarctions within cerebellum, brainstem, and basal ganglia.   Dg Chest Portable 1 View 01/13/2019 IMPRESSION:  No acute disease. Emphysema. Atherosclerosis.   Transthoracic Echocardiogram  See 12/18/18 study    Bilateral Carotid Dopplers  See 12/18/18 study   EKG - ST rate 102 BPM. (See cardiology reading for complete details)    PHYSICAL EXAM Blood pressure 134/86, pulse 85, temperature (!) 97.5 F (36.4 C), temperature source Oral, resp. rate 13, height 5\' 2"  (1.575 m), weight 54.2 kg, SpO2 98 %. Frail elderly African-American lady not in distress.  Lying comfortably in bed. . Afebrile. Head is nontraumatic. Neck is supple without bruit.    Cardiac exam no murmur or gallop. Lungs are clear to auscultation. Distal pulses are well felt. Neurological Exam :  Awake alert oriented to person only.  Diminished  attention, registration and recall.  Speech is hesitant and just occasional words and short sentences.  Follows simple midline commands only.  Eyes in primary position.  Blinks to threat on the right but not on the left.  Left lower facial weakness.  Tongue midline.  Spastic left hemiplegia with left upper extremity non-fixed contractures at the left wrist and hand.  Grade 1-2/5 left upper extremity and 2/5 left lower extremity strength.  Purposeful antigravity movements on the right side.    ASSESSMENT/PLAN Crystal Moore is a 73 y.o. female with history of previous strokes, seizures, DM, tobacco use, and COPD presenting with AMS and speech difficulties. She did  not receive IV t-PA due to recent stroke.  Stroke: right ACA distribution infarction with a small focus of age-indeterminate petechial hemorrhage in the right frontal lobe - right A1 segment disease  Resultant worsening speech and left-sided weakness  CT head - not performed  MRI head - Right ACA distribution infarction with a small focus of age-indeterminate petechial hemorrhage in the right frontal lobe. The area of infarction is increased in size from the 12/17/2018 MRI and there are new areas of reduced diffusion indicating interval ischemia.   MRA head - Severe intracranial atherosclerotic disease. Of note, there is a moderately severe stenosis of the right A1 segment.  CTA H&N - not performed  Carotid Doppler - see 5/24 - Left vertebral artery demonstrates an occlusion.  2D Echo - see 5/24  - EF 50 - 55%. No cardiac source of emboli identified.   EEG -no seizure activity.    Sars Corona Virus 2 - not detected  LDL - 100 on 12/18/2018  HgbA1c - 12.3 on 12/18/2018  UDS - not performed  VTE prophylaxis - Lovenox  Diet - Heart healthy / carb modified with thin liquids.  aspirin 81 mg daily and clopidogrel 75 mg daily prior to admission, now on aspirin 81 mg daily and clopidogrel 75 mg daily  Patient counseled to be compliant with her antithrombotic medications  Ongoing aggressive stroke risk factor management  Therapy recommendations:  pending  Disposition:  Pending  Hypertension  Stable . Permissive hypertension (OK if < 220/120) but gradually normalize in 5-7 days . Long-term BP goal normotensive  Hyperlipidemia  Lipid lowering medication PTA: Lipitor 80 mg daily  LDL 100 on 12/18/2018, goal < 70  Current lipid lowering medication: Lipitor 80 mg daily  Continue statin at discharge  Diabetes  HgbA1c 12.3 on 12/18/2018 , goal < 7.0  Uncontrolled  Other Stroke Risk Factors  Advanced age  Cigarette smoker - advised to stop smoking  Hx  stroke/TIA   Other Active Problems  Hypomagnesemia - supplemented    Hospital day # 1 I   She came with worsening of her speech difficulties and MRI shows some interval evolution of the large right ACA infarct I am not sure that necessarily can explain her worsening.  She seems to have recovered back to her baseline.  Recommend continue aspirin.  Long discussion with Dr. Lucianne Lei and answered questions.  Stroke team will sign off.  Kindly call for questions  Antony Contras, MD Medical Director [place] Pager: [phone] 01/15/2019 12:36 PM   To contact Stroke Continuity provider, please refer to [email or web address] After hours, contact General Neurology

## 2019-01-15 NOTE — TOC Initial Note (Signed)
Transition of Care [facility]) - Initial/Assessment Note    Patient Details  Name: Crystal Moore MRN: [id number] Date of Birth: 1945-12-06  Transition of Care [facility]) CM/SW Contact:    Gelene Mink, [place] Phone Number: 01/15/2019, 2:48 PM  Clinical Narrative:              CSW called and spoke with the patient's daughter. The patient is only alert and oriented x1. The patient lives with her daughter. The patient's daughter, Crystal Moore, stated that her mother has stayed at a SNF in [place], [place]. She is agreeable to her mother going to SNF. She believes that she can benefit from receiving it before returning home. She is not familiar with SNF facilities in the area. She would like her mother to go to a highly rated facility. CSW faxed the patient out to local facilities. CSW will call patient's daughter with bed offers.   CSW will continue to follow and assist with discharge.        Expected Discharge Plan: Skilled Nursing Facility Barriers to Discharge: Ship broker, Continued Medical Work up   Patient Goals and CMS Choice   CMS Medicare.gov Compare Post Acute Care list provided to:: Patient Represenative (must comment) Choice offered to / list presented to : Adult Children  Expected Discharge Plan and Services Expected Discharge Plan: [place] In-house Referral: Clinical Social Work Discharge Planning Services: NA Post Acute Care Choice: [place] Living arrangements for the past 2 months: [place]                 DME Arranged: N/A DME Agency: NA     Representative spoke with at DME Agency: NA [place] Arranged: NA          Prior Living Arrangements/Services Living arrangements for the past 2 months: [place] Lives with:: Adult Children Patient language and need for interpreter reviewed:: No Do you feel safe going back to the place where you live?: No   Pt daughter wants her mom to go to rehab before she  comes home  Need for Family Participation in Patient Care: Yes (Comment) Care giver support system in place?: Yes (comment) Current home services: (NA) Criminal Activity/Legal Involvement Pertinent to Current Situation/Hospitalization: No - Comment as needed  Activities of Daily Living Home Assistive Devices/Equipment: Wheelchair ADL Screening (condition at time of admission) Patient's cognitive ability adequate to safely complete daily activities?: No Is the patient deaf or have difficulty hearing?: No Does the patient have difficulty seeing, even when wearing glasses/contacts?: No Does the patient have difficulty concentrating, remembering, or making decisions?: Yes Patient able to express need for assistance with ADLs?: No Does the patient have difficulty dressing or bathing?: Yes Independently performs ADLs?: No Communication: Independent Is this a change from baseline?: Change from baseline, expected to last <3 days Dressing (OT): Needs assistance Is this a change from baseline?: Pre-admission baseline Grooming: Needs assistance Is this a change from baseline?: Change from baseline, expected to last <3 days Feeding: Needs assistance Is this a change from baseline?: Change from baseline, expected to last <3 days Bathing: Needs assistance Is this a change from baseline?: Pre-admission baseline Toileting: Needs assistance Is this a change from baseline?: Pre-admission baseline In/Out Bed: Dependent Is this a change from baseline?: Pre-admission baseline Walks in Home: Dependent Is this a change from baseline?: Pre-admission baseline Does the patient have difficulty walking or climbing stairs?: Yes Weakness of Legs: Left Weakness of Arms/Hands: Left  Permission Sought/Granted Permission  sought to share information with : Case Manager Permission granted to share information with : Yes, Verbal Permission Granted  Share Information with NAME: Crystal Moore  Permission granted to  share info w AGENCY: All SNF  Permission granted to share info w Relationship: Daughter  Permission granted to share info w Contact Information: [phone]  Emotional Assessment Appearance:: Appears younger than stated age, Vita Barley stated age Attitude/Demeanor/Rapport: Unable to Assess Affect (typically observed): Unable to Assess Orientation: : Oriented to Self Alcohol / Substance Use: Not Applicable Psych Involvement: No (comment)  Admission diagnosis:  Generalized weakness [R53.1] Acute encephalopathy [G93.40] Patient Active Problem List   Diagnosis Date Noted  . AKI (acute kidney injury) ([place]) 01/15/2019  . Cerebral thrombosis with cerebral infarction 01/15/2019  . TIA (transient ischemic attack) 01/13/2019  . Type 2 diabetes mellitus with vascular disease ([place]) 01/13/2019  . Hypokalemia 12/18/2018  . Acute CVA (cerebrovascular accident) ([place]) 12/17/2018  . Malnutrition of moderate degree 05/31/2018  . Hematochezia 05/29/2018  . Ischemic colitis ([place]) 05/29/2018  . Acute encephalopathy 05/29/2018  . Type 2 diabetes mellitus ([place]) 05/29/2018  . Hypertension 05/29/2018  . Depression 05/29/2018  . COPD (chronic obstructive pulmonary disease) ([place]) 05/29/2018  . Sepsis ([place]) 05/28/2018  . Diabetes ([place]) 03/16/2018  . ILD (interstitial lung disease) ([place]) 02/18/2018  . Chronic respiratory failure with hypoxia ([place]) 02/18/2018  . COPD (chronic obstructive pulmonary disease) ([place]) 01/12/2018  . Tobacco abuse 01/12/2018   PCP:  [name], MD Pharmacy:   [facility] [place], [address] [address] [place] [address] Phone: [phone] Fax: [phone]     Social Determinants of Health (SDOH) Interventions    Readmission Risk Interventions No flowsheet data found.

## 2019-01-15 NOTE — Evaluation (Signed)
Physical Therapy Evaluation Patient Details Name: Crystal Moore MRN: [id number] DOB: Aug 07, 1945 Today's Date: 01/15/2019   History of Present Illness  73 y.o. female with history of CVA admitted last month for acute CVA with left-sided hemiparesis, diabetes mellitus, seizure not on antiepileptics, COPD was found to be acutely confused with difficulty speaking by patient's daughter. Admitted 6/19 for acute encephalopathy. MRI revealed Right ACA distribution infarction area of infarction is increased in size from the 12/17/2018 MRI   Clinical Impression  Pt with limited verbal communication only able to state daughter's name, otherwise nods or shakes head in agreement/disagreement. Per prior hospitalization notes from 11/2018, pt was modA for bed mobility and maxAx2 for bed mobility. Pt limited in safe mobility by L sided weakness, and decreased safety awareness. Pt currently requires minA for bed mobility and maxAx2 for pivot transfer to chair. Pt would benefit from SNF level rehab at discharge but unlikely daughter will agree to placement, in which case she will require 24 hour care and HHPT. PT will continue to follow acutely to progress mobility.     Follow Up Recommendations SNF    Equipment Recommendations  Other (comment)(TBD at next venue)       Precautions / Restrictions Precautions Precautions: Fall Restrictions Weight Bearing Restrictions: No      Mobility  Bed Mobility Overal bed mobility: Needs Assistance Bed Mobility: Supine to Sit     Supine to sit: Min assist     General bed mobility comments: with increased time and effort pt able to move LE off bed, requires minA for trunk to upright and scoot of hips to EoB  Transfers Overall transfer level: Needs assistance Equipment used: 2 person hand held assist   Sit to Stand: +2 physical assistance;Min assist Stand pivot transfers: Mod assist;+2 physical assistance       General transfer comment: placed RW in front  of her but does not exhibit knowledge of correct use, minAx2 for power up to standing, modAx2 required for pivoting due to decreased ability to move L LE, multimodal cuing for sequencing     Modified Rankin (Stroke Patients Only) Modified Rankin (Stroke Patients Only) Pre-Morbid Rankin Score: Severe disability Modified Rankin: Severe disability     Balance Overall balance assessment: Needs assistance Sitting-balance support: Feet supported;No upper extremity supported Sitting balance-Leahy Scale: Fair     Standing balance support: Bilateral upper extremity supported;During functional activity Standing balance-Leahy Scale: Zero                               Pertinent Vitals/Pain Pain Assessment: Faces Faces Pain Scale: No hurt    Home Living Family/patient expects to be discharged to:: Private residence Living Arrangements: Children;Other relatives Available Help at Discharge: Family;Available 24 hours/day Type of Home: House Home Access: Stairs to enter Entrance Stairs-Rails: Can reach both Entrance Stairs-Number of Steps: 1 Home Layout: One level Home Equipment: Cane - single point;Shower seat;Walker - 2 wheels;Hospital bed;Other (comment)(hoyer lift (information gleaned from discharge note prior ) Additional Comments: Pt shaking or nodding head- pt with expressive difficulties. Pt requires assist for ADL due to CVA per chart- Pt came from home with daughter.    Prior Function Level of Independence: Needs assistance   Gait / Transfers Assistance Needed: unsure      Comments: discharge notes last note indicate, HHPT, HHOT, Nurses Aide, RN, SPL        Extremity/Trunk Assessment   Upper Extremity Assessment Upper  Extremity Assessment: Defer to OT evaluation    Lower Extremity Assessment Lower Extremity Assessment: Difficult to assess due to impaired cognition LLE Deficits / Details: able to move L LE from hip across bed and with attempt toward  stepping. and maintain L knee extension with pivot    Cervical / Trunk Assessment Cervical / Trunk Assessment: Kyphotic  Communication   Communication: Expressive difficulties(sparse answering of questions)  Cognition Arousal/Alertness: Awake/alert Behavior During Therapy: Flat affect Overall Cognitive Status: Difficult to assess Area of Impairment: Following commands;Problem solving                 Orientation Level: Person     Following Commands: Follows one step commands with increased time;Follows one step commands consistently     Problem Solving: Slow processing;Difficulty sequencing;Requires verbal cues;Requires tactile cues General Comments: pt speaking minimally during session, follows one step commands with increased time       General Comments General comments (skin integrity, edema, etc.): VSS        Assessment/Plan    PT Assessment Patient needs continued PT services  PT Problem List Decreased strength;Decreased range of motion;Decreased activity tolerance;Decreased balance;Decreased mobility;Decreased coordination;Decreased cognition;Decreased knowledge of use of DME;Decreased safety awareness;Decreased knowledge of precautions;Impaired tone;Impaired sensation       PT Treatment Interventions DME instruction;Gait training;Functional mobility training;Therapeutic activities;Therapeutic exercise;Balance training;Neuromuscular re-education;Cognitive remediation;Patient/family education    PT Goals (Current goals can be found in the Care Plan section)  Acute Rehab PT Goals Patient Stated Goal: none stated PT Goal Formulation: Patient unable to participate in goal setting Time For Goal Achievement: 01/29/19 Potential to Achieve Goals: Fair    Frequency Min 4X/week        Co-evaluation PT/OT/SLP Co-Evaluation/Treatment: Yes Reason for Co-Treatment: For patient/therapist safety PT goals addressed during session: Mobility/safety with mobility;Balance          AM-PAC PT "6 Clicks" Mobility  Outcome Measure Help needed turning from your back to your side while in a flat bed without using bedrails?: A Little Help needed moving from lying on your back to sitting on the side of a flat bed without using bedrails?: A Little Help needed moving to and from a bed to a chair (including a wheelchair)?: Total Help needed standing up from a chair using your arms (e.g., wheelchair or bedside chair)?: Total Help needed to walk in hospital room?: Total Help needed climbing 3-5 steps with a railing? : Total 6 Click Score: 10    End of Session Equipment Utilized During Treatment: Gait belt Activity Tolerance: Patient tolerated treatment well Patient left: in chair;with call bell/phone within reach;with chair alarm set Nurse Communication: Mobility status PT Visit Diagnosis: Unsteadiness on feet (R26.81);Other abnormalities of gait and mobility (R26.89);Muscle weakness (generalized) (M62.81);Difficulty in walking, not elsewhere classified (R26.2);Other symptoms and signs involving the nervous system (R29.898) Hemiplegia - Right/Left: Left Hemiplegia - dominant/non-dominant: Non-dominant    Time: 5035-4656 PT Time Calculation (min) (ACUTE ONLY): 26 min   Charges:   PT Evaluation $PT Eval Moderate Complexity: 1 Mod          Bryssa Tones B. Migdalia Dk PT, DPT Acute Rehabilitation Services Pager [phone] Office [phone]   [place] 01/15/2019, 12:36 PM

## 2019-01-15 NOTE — NC FL2 (Signed)
[place] LEVEL OF CARE SCREENING TOOL     IDENTIFICATION  Patient Name: Crystal Moore Birthdate: [birth year] Sex: female Admission Date (Current Location): 01/13/2019  [facility] and [place] Number:  Herbalist and Address:  [facility], [place] [address] [place], [place] [address]      Provider Number: [id number]  Attending Physician Name and Address:  Geradine Girt, DO  Relative Name and Phone Number:  Lissette Schenk, Daughter, [phone]    Current Level of Care: Hospital Recommended Level of Care: [place] Prior Approval Number:    Date Approved/Denied:   PASRR Number: [id number]  Discharge Plan: SNF    Current Diagnoses: Patient Active Problem List   Diagnosis Date Noted  . AKI (acute kidney injury) ([place]) 01/15/2019  . Cerebral thrombosis with cerebral infarction 01/15/2019  . TIA (transient ischemic attack) 01/13/2019  . Type 2 diabetes mellitus with vascular disease ([place]) 01/13/2019  . Hypokalemia 12/18/2018  . Acute CVA (cerebrovascular accident) ([place]) 12/17/2018  . Malnutrition of moderate degree 05/31/2018  . Hematochezia 05/29/2018  . Ischemic colitis ([place]) 05/29/2018  . Acute encephalopathy 05/29/2018  . Type 2 diabetes mellitus ([place]) 05/29/2018  . Hypertension 05/29/2018  . Depression 05/29/2018  . COPD (chronic obstructive pulmonary disease) ([place]) 05/29/2018  . Sepsis ([place]) 05/28/2018  . Diabetes ([place]) 03/16/2018  . ILD (interstitial lung disease) ([place]) 02/18/2018  . Chronic respiratory failure with hypoxia ([place]) 02/18/2018  . COPD (chronic obstructive pulmonary disease) ([place]) 01/12/2018  . Tobacco abuse 01/12/2018    Orientation RESPIRATION BLADDER Height & Weight     Self  Normal Incontinent, External catheter Weight: 119 lb 7.8 oz (54.2 kg) Height:  5\' 2"  (157.5 cm)  BEHAVIORAL SYMPTOMS/MOOD NEUROLOGICAL BOWEL NUTRITION STATUS      Incontinent Diet(cardiac/carb mod, thin  liquids)  AMBULATORY STATUS COMMUNICATION OF NEEDS Skin   Limited Assist Verbally Skin abrasions(on right leg, dry skin)                       Personal Care Assistance Level of Assistance  Bathing, Feeding, Dressing, Total care Bathing Assistance: Limited assistance Feeding assistance: Independent(Can feed self, but needs assistance with setting up) Dressing Assistance: Limited assistance Total Care Assistance: Limited assistance   Functional Limitations Info  Sight, Hearing, Speech Sight Info: Adequate Hearing Info: Adequate Speech Info: Adequate    SPECIAL CARE FACTORS FREQUENCY  PT (By licensed PT), OT (By licensed OT)     PT Frequency: 5x/wk OT Frequency: 5x/wk            Contractures Contractures Info: Not present    Additional Factors Info  Code Status, Allergies, Insulin Sliding Scale Code Status Info: Full Code Allergies Info: No allergies   Insulin Sliding Scale Info: insulin aspart novolog 0-9 units 3x daily w/meals       Current Medications (01/15/2019):  This is the current hospital active medication list Current Facility-Administered Medications  Medication Dose Route Frequency Provider Last Rate Last Dose  . acetaminophen (TYLENOL) tablet 650 mg  650 mg Oral Q4H PRN Rise Patience, MD       Or  . acetaminophen (TYLENOL) solution 650 mg  650 mg Per Tube Q4H PRN Rise Patience, MD       Or  . acetaminophen (TYLENOL) suppository 650 mg  650 mg Rectal Q4H PRN Rise Patience, MD      . albuterol (PROVENTIL) (2.5 MG/3ML) 0.083% nebulizer solution 2.5 mg  2.5 mg Inhalation Q4H PRN Rise Patience, MD      . aspirin EC tablet 81 mg  81 mg Oral Daily Rise Patience, MD   81 mg at 01/15/19 1129  . atorvastatin (LIPITOR) tablet 80 mg  80 mg Oral q1800 Rise Patience, MD   80 mg at 01/14/19 1846  . clopidogrel (PLAVIX) tablet 75 mg  75 mg Oral Daily Rise Patience, MD   75 mg at 01/15/19 1129  . enoxaparin (LOVENOX)  injection 40 mg  40 mg Subcutaneous Daily Rise Patience, MD   40 mg at 01/15/19 1129  . insulin aspart (novoLOG) injection 0-9 Units  0-9 Units Subcutaneous TID WC Rise Patience, MD   2 Units at 01/15/19 8241     Discharge Medications: Please see discharge summary for a list of discharge medications.  Relevant Imaging Results:  Relevant Lab Results:   Additional Information SSN: [id number]  Philippa Chester Antionne Enrique, LCSWA

## 2019-01-15 NOTE — Progress Notes (Signed)
Pt keeps pulling taking  OFF the Tele, On call aware, will continue to monitor

## 2019-01-16 DIAGNOSIS — E876 Hypokalemia: Secondary | ICD-10-CM

## 2019-01-16 LAB — GLUCOSE, CAPILLARY
Glucose-Capillary: 169 mg/dL — ABNORMAL HIGH (ref 70–99)
Glucose-Capillary: 212 mg/dL — ABNORMAL HIGH (ref 70–99)
Glucose-Capillary: 176 mg/dL — ABNORMAL HIGH (ref 70–99)
Glucose-Capillary: 111 mg/dL — ABNORMAL HIGH (ref 70–99)

## 2019-01-16 LAB — BASIC METABOLIC PANEL
Anion gap: 8 (ref 5–15)
BUN: 12 mg/dL (ref 8–23)
CO2: 25 mmol/L (ref 22–32)
Calcium: 9.1 mg/dL (ref 8.9–10.3)
Chloride: 107 mmol/L (ref 98–111)
Creatinine, Ser: 0.63 mg/dL (ref 0.44–1.00)
GFR calc Af Amer: >60 mL/min (ref >60)
GFR calc non Af Amer: >60 mL/min (ref >60)
Glucose, Bld: 166 mg/dL — ABNORMAL HIGH (ref 70–99)
Potassium: 4.1 mmol/L (ref 3.5–5.1)
Sodium: 140 mmol/L (ref 135–145)

## 2019-01-16 LAB — MAGNESIUM: Magnesium: 1.1 mg/dL — ABNORMAL LOW (ref 1.7–2.4)

## 2019-01-16 LAB — CBC
HCT: 38.7 % (ref 36.0–46.0)
Hemoglobin: 12.7 g/dL (ref 12.0–15.0)
MCH: 26.8 pg (ref 26.0–34.0)
MCHC: 32.8 g/dL (ref 30.0–36.0)
MCV: 81.8 fL (ref 80.0–100.0)
Platelets: 230 10*3/uL (ref 150–400)
RBC: 4.73 MIL/uL (ref 3.87–5.11)
RDW: 14.3 % (ref 11.5–15.5)
WBC: 7.7 10*3/uL (ref 4.0–10.5)
nRBC: 0 % (ref 0.0–0.2)

## 2019-01-16 MED ORDER — ENSURE ENLIVE PO LIQD
237.0000 mL | Freq: Two times a day (BID) | ORAL | Status: DC
  Administered 2019-01-16 – 2019-01-17 (×3): 237 mL via ORAL

## 2019-01-16 MED ORDER — MAGNESIUM SULFATE 2 GM/50ML IV SOLN
2.0000 g | Freq: Once | INTRAVENOUS | Status: AC
  Administered 2019-01-16: 2 g via INTRAVENOUS
  Filled 2019-01-16: qty 50

## 2019-01-16 MED ORDER — SENNOSIDES-DOCUSATE SODIUM 8.6-50 MG PO TABS
1.0000 | ORAL_TABLET | Freq: Two times a day (BID) | ORAL | Status: DC
  Administered 2019-01-16 – 2019-01-17 (×3): 1 via ORAL
  Filled 2019-01-16 (×3): qty 1

## 2019-01-16 MED ORDER — INSULIN GLARGINE 100 UNIT/ML ~~LOC~~ SOLN
5.0000 [IU] | Freq: Every day | SUBCUTANEOUS | Status: DC
  Administered 2019-01-16: 5 [IU] via SUBCUTANEOUS
  Filled 2019-01-16 (×3): qty 0.05

## 2019-01-16 MED ORDER — TRAZODONE HCL 50 MG PO TABS
25.0000 mg | ORAL_TABLET | Freq: Every evening | ORAL | Status: DC | PRN
  Administered 2019-01-16: 25 mg via ORAL
  Filled 2019-01-16: qty 1

## 2019-01-16 NOTE — Progress Notes (Signed)
CSW gave bed offers to the patient's daughter, she chose [facility] in [place].   CSW called and spoke with Elyse Hsu and [facility], they will start insurance authorization.   CSW will continue to assist with disposition planning.   Domenic Schwab, MSW, [place]

## 2019-01-16 NOTE — Progress Notes (Signed)
Progress Note    Crystal Moore  LYY:[id number] DOB: [birth year]  DOA: 01/13/2019 PCP: [name], MD    Brief Narrative:     Medical records reviewed and are as summarized below:  Crystal Moore is an 73 y.o. female with history of CVA admitted last month for acute CVA with left-sided hemiparesis, diabetes mellitus, seizure not on antiepileptics, COPD was found to be acutely confused by patient's daughter.  Patient's daughter states that she left home early in the morning around 2 and came back around 4:30 PM when patient's mother was found to be confused and has had difficulty speaking.  Patient has known history of left-sided hemiparesis.  Patient was brought to the ER.  Assessment/Plan:   Principal Problem:   Acute encephalopathy Active Problems:   COPD (chronic obstructive pulmonary disease) (HCC)   Hypertension   Hypokalemia   Type 2 diabetes mellitus with vascular disease (HCC)   AKI (acute kidney injury) ([place])   Cerebral thrombosis with cerebral infarction  anterograde memory loss and confusion. MRI brain demonstrates a right ACA distribution infarction, representing extension of the infarction seen on the prior MRI dated 12/17/18. -neurology consult appreciated: The patient had a recent large right hemispheric infarct with residual speech difficulties and spastic left hemiplegia.  She came with worsening of her speech difficulties and MRI shows some interval evolution of the large right ACA infarct I am not sure that necessarily can explain her worsening.  Recommend look for other reversible etiologies like worsening kidney function, low sugar, urinary tract infection and dehydration.  No need to do further stroke work-up as she had a complete work-up recently.  (patient had worsening renal failure as well as low blood sugar at presentation) -ASA/plavix/statin -MRA brain done with motion degradation -PT/OT pending  Acute renal failure  -resolved  Diabetes  mellitus type 2 uncontrolled -mildly hypoglycemic upon admission -resume smaller dose of lantus -SSI  COPD  -PRN albuterol.  Hypomagnesemia -replete  History of seizures -eeg normal  Elevated BP -may need BP medication started vs keeping log at home--- BP on higher end  Family Communication/Anticipated D/C date and plan/Code Status   DVT prophylaxis: Lovenox ordered. Code Status: Full Code.  Family Communication: spoke with daughter 6/22  Disposition Plan: SNF   Medical Consultants:    Neurology     Subjective:   No major overnight events-- more active  Objective:    Vitals:   01/15/19 2305 01/16/19 0308 01/16/19 0730 01/16/19 1122  BP: (!) 107/91 (!) 147/96 (!) 132/101 139/74  Pulse: 75 80 88 85  Resp: 15 15 18 18   Temp: 97.6 F (36.4 C) (!) 97.4 F (36.3 C) 98.5 F (36.9 C) 98.2 F (36.8 C)  TempSrc: Oral Oral Oral Oral  SpO2: 99% 96% 99% 100%  Weight:      Height:        Intake/Output Summary (Last 24 hours) at 01/16/2019 1201 Last data filed at 01/16/2019 0100 Gross per 24 hour  Intake 177 ml  Output 1200 ml  Net -1023 ml   Filed Weights   01/13/19 1824 01/14/19 0016  Weight: 60.8 kg 54.2 kg    Exam: In bed, awake today rrr No increased work of breathing Voice soft, slow at times to respond  Data Reviewed:   I have personally reviewed following labs and imaging studies:  Labs: Labs show the following:   Basic Metabolic Panel: Recent Labs  Lab 01/13/19 1930 01/14/19 0617 01/15/19 0616 01/16/19 0714  NA  139 138 140 140  K 4.1 3.5 4.3 4.1  CL 100 104 107 107  CO2 24 25 26 25   GLUCOSE 86 106* 163* 166*  BUN 24* 25* 14 12  CREATININE 1.31* 0.86 0.67 0.63  CALCIUM 9.2 8.6* 8.8* 9.1  MG 1.4*  --   --  1.1*   GFR Estimated Creatinine Clearance: 50.3 mL/min (by C-G formula based on SCr of 0.63 mg/dL). Liver Function Tests: Recent Labs  Lab 01/13/19 1930 01/14/19 0617  AST 24 19  ALT 21 17  ALKPHOS 84 74  BILITOT 0.6  0.6  PROT 7.0 6.1*  ALBUMIN 3.6 3.0*   No results for input(s): LIPASE, AMYLASE in the last 168 hours. Recent Labs  Lab 01/13/19 1930  AMMONIA 28   Coagulation profile Recent Labs  Lab 01/13/19 1930  INR 1.1    CBC: Recent Labs  Lab 01/13/19 1930 01/14/19 0617 01/15/19 0616 01/16/19 0714  WBC 11.3* 8.9 7.7 7.7  NEUTROABS 7.3  --   --   --   HGB 13.6 12.1 12.2 12.7  HCT 42.3 37.2 38.1 38.7  MCV 84.4 82.5 82.1 81.8  PLT 247 218 228 230   Cardiac Enzymes: Recent Labs  Lab 01/13/19 1930  TROPONINI <0.03   BNP (last 3 results) No results for input(s): PROBNP in the last 8760 hours. CBG: Recent Labs  Lab 01/15/19 1140 01/15/19 1648 01/15/19 2109 01/16/19 0607 01/16/19 1119  GLUCAP 196* 127* 217* 169* 212*   D-Dimer: No results for input(s): DDIMER in the last 72 hours. Hgb A1c: No results for input(s): HGBA1C in the last 72 hours. Lipid Profile: No results for input(s): CHOL, HDL, LDLCALC, TRIG, CHOLHDL, LDLDIRECT in the last 72 hours. Thyroid function studies: Recent Labs    01/14/19 0950  TSH 0.458   Anemia work up: No results for input(s): VITAMINB12, FOLATE, FERRITIN, TIBC, IRON, RETICCTPCT in the last 72 hours. Sepsis Labs: Recent Labs  Lab 01/13/19 1930 01/14/19 0617 01/15/19 0616 01/16/19 0714  WBC 11.3* 8.9 7.7 7.7    Microbiology Recent Results (from the past 240 hour(s))  SARS Coronavirus 2     Status: None   Collection Time: 01/13/19 11:47 PM  Result Value Ref Range Status   SARS Coronavirus 2 NOT DETECTED NOT DETECTED Final    Comment: (NOTE) SARS-CoV-2 target nucleic acids are NOT DETECTED. The SARS-CoV-2 RNA is generally detectable in upper and lower respiratory specimens during the acute phase of infection.  Negative  results do not preclude SARS-CoV-2 infection, do not rule out co-infections with other pathogens, and should not be used as the sole basis for treatment or other patient management decisions.  Negative  results must be combined with clinical observations, patient history, and epidemiological information. The expected result is Not Detected. Fact Sheet for Patients: http://www.biofiredefense.com/wp-content/uploads/2020/03/BIOFIRE-COVID -19-patients.pdf Fact Sheet for Healthcare Providers: http://www.biofiredefense.com/wp-content/uploads/2020/03/BIOFIRE-COVID -19-hcp.pdf This test is not yet approved or cleared by the [place] and  has been authorized for detection and/or diagnosis of SARS-CoV-2 by FDA under an Emergency Use Authorization (EUA).  This EUA will remain in effec t (meaning this test can be used) for the duration of  the COVID-19 declaration under Section 564(b)(1) of the Act, 21 U.S.C. section 360bbb-3(b)(1), unless the authorization is terminated or revoked sooner. Performed at [facility], [place] [address] [place], [place] [address]     Procedures and diagnostic studies:  No results found.  Medications:   . aspirin EC  81 mg Oral Daily  .  atorvastatin  80 mg Oral q1800  . clopidogrel  75 mg Oral Daily  . enoxaparin (LOVENOX) injection  40 mg Subcutaneous Daily  . feeding supplement (ENSURE ENLIVE)  237 mL Oral BID BM  . insulin aspart  0-9 Units Subcutaneous TID WC  . senna-docusate  1 tablet Oral BID   Continuous Infusions: . magnesium sulfate bolus IVPB       LOS: 2 days   Geradine Girt  [facility]   How to contact the [facility] Attending or Consulting provider [place] or covering provider during after hours [place], for this patient?  1. Check the care team in [facility] and look for a) attending/consulting [facility] provider listed and b) the [facility] team listed 2. Log into [email or web address] and use [place]'s universal password to access. If you do not have the password, please contact the hospital operator. 3. Locate the [facility] provider you are looking for under [facility] and page to a number that you can be directly reached. 4. If you still  have difficulty reaching the provider, please page the [facility] (Director on Call) for the Hospitalists listed on amion for assistance.  01/16/2019, 12:01 PM

## 2019-01-16 NOTE — Evaluation (Signed)
Speech Language Pathology Evaluation Patient Details Name: Crystal Moore MRN: [id number] DOB: July 28, 1945 Today's Date: 01/16/2019 Time: 8756-4332 SLP Time Calculation (min) (ACUTE ONLY): 23 min  Problem List:  Patient Active Problem List   Diagnosis Date Noted  . AKI (acute kidney injury) ([place]) 01/15/2019  . Cerebral thrombosis with cerebral infarction 01/15/2019  . TIA (transient ischemic attack) 01/13/2019  . Type 2 diabetes mellitus with vascular disease ([place]) 01/13/2019  . Hypokalemia 12/18/2018  . Acute CVA (cerebrovascular accident) ([place]) 12/17/2018  . Malnutrition of moderate degree 05/31/2018  . Hematochezia 05/29/2018  . Ischemic colitis ([place]) 05/29/2018  . Acute encephalopathy 05/29/2018  . Type 2 diabetes mellitus ([place]) 05/29/2018  . Hypertension 05/29/2018  . Depression 05/29/2018  . COPD (chronic obstructive pulmonary disease) ([place]) 05/29/2018  . Sepsis ([place]) 05/28/2018  . Diabetes ([place]) 03/16/2018  . ILD (interstitial lung disease) ([place]) 02/18/2018  . Chronic respiratory failure with hypoxia ([place]) 02/18/2018  . COPD (chronic obstructive pulmonary disease) ([place]) 01/12/2018  . Tobacco abuse 01/12/2018   Past Medical History:  Past Medical History:  Diagnosis Date  . Arthritis   . COPD (chronic obstructive pulmonary disease) ([place])   . CVA (cerebral vascular accident) ([place])   . Diabetes ([place])   . Diabetes mellitus without complication ([place])   . Hemiparesis affecting left side as late effect of cerebrovascular accident (CVA) ([place])   . Pyelonephritis   . Seizures ([place])   . Stroke ([place])   . Tobacco use   . Tobacco use disorder   . UTI (urinary tract infection)    Past Surgical History:  Past Surgical History:  Procedure Laterality Date  . TUBAL LIGATION     HPI:  Crystal Moore is an 73 y.o. female with history of CVA admitted last month for acute CVA with left-sided hemiparesis, diabetes mellitus, seizure not on antiepileptics, COPD was found to be  acutely confused by patient's daughter.  Patient's daughter states that she left home early in the morning around 2 and came back around 4:30 PM when patient's mother was found to be confused and has had difficulty speaking.  MRI 6/19 showed evoluation of R ACA stroke noting that the "area of infarction is increased in size from the 12/17/2018 MRI and there are new areas of reduced diffusion indicating interval ischemia."   Assessment / Plan / Recommendation Clinical Impression  Pt presents with moderate to severe expressive and receptive language deficits.  These deficits are suspected to be compounded by cognitive deficits.  Pt exhibited slow processing, inattention, and decreased initiation during evaluation.  Pt was oriented to person only.  The Western Aphasia Battery - Bedside Form was administered today with an overall Bedside Aphasia Score of 27.5.  See below for additional information. Pt demonstrated relative strength in naming, and occasionally benefitted from phonemic and semantic cues for word finding.  Pt answered yes/no questions with 60% accuracy, with errors with complex questions noted.  Pt was 100% correct with biographical information, and answered 2 of 3 questions correctly for immediate context.  With simple 1 step commands, pt performed with 40% accuracy with visual model.  Pt will likely need full time support at discharge.    Western Aphasia Battery - Bedside Form Spontaneous Speech: Content - 1/10 Spontaneous Speech: Fluency - 2/10 Yes/No Questions - 6/10 Sequential Commands - 0/10 Repetition - 1/10 Object Naming - 6.5/10 Bedside Aphasia Score: 27.5/100   SLP Assessment  SLP Recommendation/Assessment: Patient needs continued Speech Lanaguage Pathology Services SLP Visit Diagnosis: Attention and  concentration deficit;Aphasia (R47.01);Cognitive communication deficit (R41.841)    Follow Up Recommendations  Skilled Nursing facility    Frequency and Duration min 1 x/week   2 weeks      SLP Evaluation Cognition  Overall Cognitive Status: Impaired/Different from baseline Arousal/Alertness: Awake/alert Orientation Level: Oriented to person Attention: Sustained Focused Attention: Impaired Sustained Attention: Impaired Sustained Attention Impairment: Verbal basic       Comprehension  Auditory Comprehension Overall Auditory Comprehension: Impaired Yes/No Questions: Impaired Commands: Impaired Conversation: Simple Interfering Components: Attention;Processing speed Visual Recognition/Discrimination Discrimination: Not tested Reading Comprehension Reading Status: Not tested    Expression Expression Primary Mode of Expression: Verbal Verbal Expression Overall Verbal Expression: Impaired Initiation: Impaired Level of Generative/Spontaneous Verbalization: Word;Phrase Repetition: Impaired Level of Impairment: Word level Naming: Impairment Responsive: 51-75% accurate Confrontation: Impaired Pragmatics: Impairment Impairments: Monotone(flat affect) Interfering Components: Attention;Premorbid deficit Effective Techniques: Phonemic cues Written Expression Written Expression: Not tested   Oral / Motor  Oral Motor/Sensory Function Overall Oral Motor/Sensory Function: (unable to test)   [place], MA, [place] Office: [phone]; Pager  (6/22): [phone] 01/16/2019, 3:12 PM

## 2019-01-17 LAB — GLUCOSE, CAPILLARY
Glucose-Capillary: 170 mg/dL — ABNORMAL HIGH (ref 70–99)
Glucose-Capillary: 159 mg/dL — ABNORMAL HIGH (ref 70–99)

## 2019-01-17 MED ORDER — ALBUTEROL SULFATE (2.5 MG/3ML) 0.083% IN NEBU: 2.5000 mg | INHALATION_SOLUTION | RESPIRATORY_TRACT | 12 refills | Status: DC | PRN

## 2019-01-17 MED ORDER — LANTUS 100 UNIT/ML ~~LOC~~ SOLN: 10.0000 [IU] | Freq: Every day | SUBCUTANEOUS | 11 refills | Status: DC

## 2019-01-17 MED ORDER — TRAZODONE HCL 50 MG PO TABS: 25.0000 mg | ORAL_TABLET | Freq: Every evening | ORAL | Status: DC | PRN

## 2019-01-17 MED ORDER — INSULIN ASPART 100 UNIT/ML ~~LOC~~ SOLN: 0.0000 [IU] | Freq: Three times a day (TID) | SUBCUTANEOUS | 11 refills | Status: DC

## 2019-01-17 MED ORDER — ENSURE ENLIVE PO LIQD: 237.0000 mL | Freq: Two times a day (BID) | ORAL | 12 refills | Status: DC

## 2019-01-17 NOTE — Discharge Summary (Signed)
Physician Discharge Summary  Crystal Moore SEG:[id number] DOB: Jun 08, 1946 DOA: 01/13/2019  PCP: [name], MD  Admit date: 01/13/2019 Discharge date: 01/17/2019  Admitted From: home  Discharge disposition: SNF   Recommendations for Outpatient Follow-Up:   1. Continue titration of blood sugar medications for better control but avoiding hypoglycemia 2. Encourage PO intake 3. Mg with next lab check   Discharge Diagnosis:   Principal Problem:   Acute encephalopathy Active Problems:   COPD (chronic obstructive pulmonary disease) (HCC)   Hypertension   Hypokalemia   Type 2 diabetes mellitus with vascular disease (HCC)   AKI (acute kidney injury) ([place])   Cerebral thrombosis with cerebral infarction    Discharge Condition: Improved.  Diet recommendation: Low sodium, heart healthy.  Carbohydrate-modified  Wound care: None.  Code status: Full.   History of Present Illness:  Crystal Moore is a 73 y.o. female with history of CVA admitted last month for acute CVA with left-sided hemiparesis, diabetes mellitus, seizure not on antiepileptics, COPD was found to be acutely confused by patient's daughter.  Patient's daughter states that she left home early in the morning around 2 and came back around 4:30 PM when patient's mother was found to be confused and has had difficulty speaking.  Patient has known history of left-sided hemiparesis.  Patient was brought to the ER.   Hospital Course by Problem:   anterograde memory loss and confusion. MRI brain demonstrates a right ACA distribution infarction, representing extension of the infarction seen on the prior MRI dated 12/17/18. -neurology consult appreciated: The patient had a recent large right hemispheric infarct with residual speech difficulties and spastic left hemiplegia. She came with worsening of her speech difficulties and MRI shows some interval evolution of the large right ACA infarct I am not sure that  necessarily can explain her worsening. Recommend look for other reversible etiologies like worsening kidney function, low sugar, urinary tract infection and dehydration. No need to do further stroke work-up as she had a complete work-up recently.  (patient had worsening renal failure as well as low blood sugar at presentation) -ASA/plavix/statin -MRA brain done with motion degradation -PT/OT -- SNF  Acute renal failure -resolved  Diabetes mellitus type 2uncontrolled -mildly hypoglycemic upon admission -resume smaller dose of lantus -SSI  COPD  -PRN albuterol.  Hypomagnesemia -replete  History of seizures -eeg normal     Medical Consultants:   neurology   Discharge Exam:   Vitals:   01/17/19 0003 01/17/19 0318  BP: 132/76 130/71  Pulse: 81 88  Resp: 18 18  Temp: 98.3 F (36.8 C) 98 F (36.7 C)  SpO2: 98% 99%   Vitals:   01/16/19 1613 01/16/19 2002 01/17/19 0003 01/17/19 0318  BP: (!) 143/86 138/81 132/76 130/71  Pulse: 81 88 81 88  Resp: 18 18 18 18   Temp: 98.1 F (36.7 C) 98.5 F (36.9 C) 98.3 F (36.8 C) 98 F (36.7 C)  TempSrc: Oral Oral Oral Oral  SpO2: 99% 98% 98% 99%  Weight:      Height:        General exam: Appears calm and comfortable. Speaking more  The results of significant diagnostics from this hospitalization (including imaging, microbiology, ancillary and laboratory) are listed below for reference.     Procedures and Diagnostic Studies:   Mr Angio Head Wo Contrast  Result Date: 01/14/2019 CLINICAL DATA:  Right anterior cerebral artery stroke. EXAM: MRA HEAD WITHOUT CONTRAST TECHNIQUE: Angiographic images of the Circle of Willis  were obtained using MRA technique without intravenous contrast. COMPARISON:  MRI head 01/13/2019 FINDINGS: Image quality degraded by significant motion. Atherosclerotic irregularity and moderate stenosis of the cavernous carotid bilaterally. Moderate stenosis right A1 segment. Right A2 segment patent  with mild irregularity. Occlusion of the inferior division of the right MCA. Moderately severe stenosis of the superior division of the right MCA Left anterior cerebral artery patent without significant stenosis. Severe stenosis left MCA bifurcation with severe disease in the inferior branch and moderate to severe stenosis in the superior branch. Right vertebral artery dominant and patent. Small left vertebral artery contributes to the basilar. Misregistration of the basilar without significant stenosis. Moderate stenosis left P1 segment and occlusion of the left P3 segment. Mild stenosis right posterior cerebral artery. IMPRESSION: Image quality degraded by significant motion Severe intracranial atherosclerotic disease as above. Of note, there is a moderately severe stenosis of the right A1 segment. Remainder of the right anterior cerebral artery patent. Electronically Signed   By: Franchot Gallo M.D.   On: 01/14/2019 11:09   Mr Brain Wo Contrast  Result Date: 01/13/2019 CLINICAL DATA:  73 y/o  F; altered mental status. EXAM: MRI HEAD WITHOUT CONTRAST TECHNIQUE: Multiplanar, multiecho pulse sequences of the brain and surrounding structures were obtained without intravenous contrast. COMPARISON:  12/17/2018 CT head and MRI head.  12/18/2018 CTA head. FINDINGS: Brain: Patchy right ACA distribution infarction with a small focus of petechial hemorrhage in the right frontal lobe, T2 FLAIR hyperintense signal abnormality, and mild local mass effect. The distribution of the infarct is increased in comparison with prior MRI of the head and there are new areas of reduced diffusion indicating interval expansion/recrudescence of the prior infarction. Very small chronic infarctions are present within the bilateral cerebellar hemispheres, the pons, and throughout the right greater than left basal ganglia as well as corona radiata. There are large confluent nonspecific T2 FLAIR hyperintensities in subcortical and  periventricular white matter are compatible with severe chronic microvascular ischemic changes. There is severe volume loss of the brain. No extra-axial collection, hydrocephalus, or herniation. Vascular: Normal flow voids. Skull and upper cervical spine: Normal marrow signal. Sinuses/Orbits: Negative. Other: Bilateral intra-ocular lens replacement. IMPRESSION: 1. Right ACA distribution infarction with a small focus of age-indeterminate petechial hemorrhage in the right frontal lobe. The area of infarction is increased in size from the 12/17/2018 MRI and there are new areas of reduced diffusion indicating interval ischemia. 2. Background of severe chronic microvascular ischemic changes and volume loss of the brain. Multiple very small chronic infarctions within cerebellum, brainstem, and basal ganglia. These results were called by telephone at the time of interpretation on 01/13/2019 at 11:08 pm to Dr. Lonzo Candy , who verbally acknowledged these results. Electronically Signed   By: Kristine Garbe M.D.   On: 01/13/2019 23:10   Dg Chest Portable 1 View  Result Date: 01/13/2019 CLINICAL DATA:  Altered mental status today. EXAM: PORTABLE CHEST 1 VIEW COMPARISON:  CT chest 02/15/2018. Single-view of the chest 12/17/2018. FINDINGS: The lungs are emphysematous with coarsening of the pulmonary interstitium. Calcified granuloma right lower lobe noted. Lungs otherwise clear. Heart size normal. Atherosclerosis is seen. No pneumothorax or pleural fluid. No acute or focal bony abnormality. IMPRESSION: No acute disease. Emphysema. Atherosclerosis. Electronically Signed   By: Inge Rise M.D.   On: 01/13/2019 19:24     Labs:   Basic Metabolic Panel: Recent Labs  Lab 01/13/19 1930 01/14/19 0617 01/15/19 0616 01/16/19 0714  NA 139 138 140 140  K 4.1 3.5 4.3 4.1  CL 100 104 107 107  CO2 24 25 26 25   GLUCOSE 86 106* 163* 166*  BUN 24* 25* 14 12  CREATININE 1.31* 0.86 0.67 0.63  CALCIUM 9.2 8.6*  8.8* 9.1  MG 1.4*  --   --  1.1*   GFR Estimated Creatinine Clearance: 50.3 mL/min (by C-G formula based on SCr of 0.63 mg/dL). Liver Function Tests: Recent Labs  Lab 01/13/19 1930 01/14/19 0617  AST 24 19  ALT 21 17  ALKPHOS 84 74  BILITOT 0.6 0.6  PROT 7.0 6.1*  ALBUMIN 3.6 3.0*   No results for input(s): LIPASE, AMYLASE in the last 168 hours. Recent Labs  Lab 01/13/19 1930  AMMONIA 28   Coagulation profile Recent Labs  Lab 01/13/19 1930  INR 1.1    CBC: Recent Labs  Lab 01/13/19 1930 01/14/19 0617 01/15/19 0616 01/16/19 0714  WBC 11.3* 8.9 7.7 7.7  NEUTROABS 7.3  --   --   --   HGB 13.6 12.1 12.2 12.7  HCT 42.3 37.2 38.1 38.7  MCV 84.4 82.5 82.1 81.8  PLT 247 218 228 230   Cardiac Enzymes: Recent Labs  Lab 01/13/19 1930  TROPONINI <0.03   BNP: Invalid input(s): POCBNP CBG: Recent Labs  Lab 01/16/19 0607 01/16/19 1119 01/16/19 1610 01/16/19 2124 01/17/19 0604  GLUCAP 169* 212* 176* 111* 170*   D-Dimer No results for input(s): DDIMER in the last 72 hours. Hgb A1c No results for input(s): HGBA1C in the last 72 hours. Lipid Profile No results for input(s): CHOL, HDL, LDLCALC, TRIG, CHOLHDL, LDLDIRECT in the last 72 hours. Thyroid function studies No results for input(s): TSH, T4TOTAL, T3FREE, THYROIDAB in the last 72 hours.  Invalid input(s): FREET3 Anemia work up No results for input(s): VITAMINB12, FOLATE, FERRITIN, TIBC, IRON, RETICCTPCT in the last 72 hours. Microbiology Recent Results (from the past 240 hour(s))  SARS Coronavirus 2     Status: None   Collection Time: 01/13/19 11:47 PM  Result Value Ref Range Status   SARS Coronavirus 2 NOT DETECTED NOT DETECTED Final    Comment: (NOTE) SARS-CoV-2 target nucleic acids are NOT DETECTED. The SARS-CoV-2 RNA is generally detectable in upper and lower respiratory specimens during the acute phase of infection.  Negative  results do not preclude SARS-CoV-2 infection, do not rule  out co-infections with other pathogens, and should not be used as the sole basis for treatment or other patient management decisions.  Negative results must be combined with clinical observations, patient history, and epidemiological information. The expected result is Not Detected. Fact Sheet for Patients: http://www.biofiredefense.com/wp-content/uploads/2020/03/BIOFIRE-COVID -19-patients.pdf Fact Sheet for Healthcare Providers: http://www.biofiredefense.com/wp-content/uploads/2020/03/BIOFIRE-COVID -19-hcp.pdf This test is not yet approved or cleared by the [place] and  has been authorized for detection and/or diagnosis of SARS-CoV-2 by FDA under an Emergency Use Authorization (EUA).  This EUA will remain in effec t (meaning this test can be used) for the duration of  the COVID-19 declaration under Section 564(b)(1) of the Act, 21 U.S.C. section 360bbb-3(b)(1), unless the authorization is terminated or revoked sooner. Performed at [facility], [place] [address], [place], [place] [address]      Discharge Instructions:   Discharge Instructions    Diet - low sodium heart healthy   Complete by: As directed    Diet Carb Modified   Complete by: As directed    Increase activity slowly   Complete by: As directed      Allergies as of 01/17/2019  No Known Allergies     Medication List    TAKE these medications   albuterol (2.5 MG/3ML) 0.083% nebulizer solution Commonly known as: PROVENTIL Inhale 3 mLs (2.5 mg total) into the lungs every 4 (four) hours as needed for wheezing or shortness of breath.   aspirin EC 81 MG tablet Take 1 tablet (81 mg total) by mouth daily.   atorvastatin 80 MG tablet Commonly known as: LIPITOR Take 1 tablet (80 mg total) by mouth daily at 6 PM.   clopidogrel 75 MG tablet Commonly known as: PLAVIX Take 1 tablet (75 mg total) by mouth daily.   feeding supplement (ENSURE ENLIVE) Liqd Take 237 mLs by mouth 2 (two) times daily  between meals.   insulin aspart 100 UNIT/ML injection Commonly known as: novoLOG Inject 0-9 Units into the skin 3 (three) times daily with meals.   Lantus 100 UNIT/ML injection Generic drug: insulin glargine Inject 0.1 mLs (10 Units total) into the skin daily after breakfast. What changed:   how much to take  how to take this   metFORMIN 500 MG tablet Commonly known as: GLUCOPHAGE Take 1,000 mg by mouth 2 (two) times a day.   nicotine 21 mg/24hr patch Commonly known as: NICODERM CQ - dosed in mg/24 hours Place 21 mg onto the skin daily. What changed: Another medication with the same name was removed. Continue taking this medication, and follow the directions you see here.   traZODone 50 MG tablet Commonly known as: DESYREL Take 0.5 tablets (25 mg total) by mouth at bedtime as needed for sleep.      Contact information for after-discharge care    Destination    [facility] .   Service: Skilled Nursing Contact information: [address] [place] [phone]               Time coordinating discharge: 35 min  Signed:  Geradine Girt DO  [facility] 01/17/2019, 10:01 AM

## 2019-01-17 NOTE — TOC Progression Note (Addendum)
Transition of Care [facility]) - Progression Note    Patient Details  Name: Crystal Moore MRN: [id number] Date of Birth: 06-23-1946  Transition of Care [facility]) CM/SW [place], LCSW Phone Number: 01/17/2019, 9:07 AM  Clinical Narrative: Patient has insurance approval to discharge to [facility] but needs new COVID test results before she can admit. Last test was done 6/19. Sent MD a message to notify.  9:18 am: SNF admissions coordinator spoke to their administrator and they said the 6/19 test is fine. They can take her today and will put her in 14-day quarantine. MD is aware.  Expected Discharge Plan: Skilled Nursing Facility Barriers to Discharge: Ship broker, Continued Medical Work up  Expected Discharge Plan and Services Expected Discharge Plan: [place] In-house Referral: Clinical Social Work Discharge Planning Services: NA Post Acute Care Choice: [place] Living arrangements for the past 2 months: [place]                 DME Arranged: N/A DME Agency: NA     Representative spoke with at DME Agency: NA HH Arranged: NA           Social Determinants of Health ([place]) Interventions    Readmission Risk Interventions No flowsheet data found.

## 2019-01-17 NOTE — TOC Transition Note (Signed)
Transition of Care [facility]) - CM/SW Discharge Note   Patient Details  Name: Crystal Moore MRN: [id number] Date of Birth: 02-17-1946  [facility] CM/SW Contact:  Candie Chroman, LCSW Phone Number: 01/17/2019, 12:39 PM   Clinical Narrative: CSW facilitated patient discharge including contacting patient family and facility to confirm patient discharge plans. Clinical information faxed to facility and family agreeable with plan. CSW arranged ambulance transport via [facility] to [facility] ([place]). RN to call report prior to discharge [phone] Room 38).  CSW will sign off for now as social work intervention is no longer needed. Please consult [place] again if new needs arise.  Final next level of care: Skilled Nursing Facility Barriers to Discharge: Barriers Resolved   Patient Goals and CMS Choice   CMS Medicare.gov Compare Post Acute Care list provided to:: Patient Represenative (must comment) Choice offered to / list presented to : Adult Children  Discharge Placement   Existing PASRR number confirmed : 01/15/19          Patient chooses bed at: [facility] Patient to be transferred to facility by: [facility] Name of family member notified: Quandra Fedorchak Patient and family notified of of transfer: 01/17/19  Discharge Plan and Services In-house Referral: Clinical Social Work Discharge Planning Services: NA Post Acute Care Choice: [place]          DME Arranged: N/A DME Agency: NA     Representative spoke with at DME Agency: NA HH Arranged: NA          Social Determinants of Health ([place]) Interventions     Readmission Risk Interventions No flowsheet data found.

## 2019-01-17 NOTE — Progress Notes (Signed)
  Speech Language Pathology Treatment: Cognitive-Linquistic(Aphasia )  Patient Details Name: Crystal Moore MRN: [id number] DOB: 1945-12-07 Today's Date: 01/17/2019 Time: 3149-7026 SLP Time Calculation (min) (ACUTE ONLY): 19 min  Assessment / Plan / Recommendation Clinical Impression  Pt was seen for aphasia treatment and was cooperative throughout the session. She is known to this SLP and did present with cognitive-linguistic deficits when she was last seen by this SLP in May. Additional processing was often required during the session. She produced some phrases during the session such as, "It looks like it's cold outside". She achieved 70% accuracy with confrontational naming increasing to 80% accuracy with phonemic cues. She responded to moderately complex yes/no questions with 50% accuracy. She demonstrated 20% accuracy with phrase completion increasing to 100% accuracy with min-mod phonemic cues. She achieved 20% accuracy with sentence completion increasing to 40% accuracy with mod cues. SLP will continue to follow pt.     HPI HPI: Crystal Moore is an 73 y.o. female with history of CVA admitted last month for acute CVA with left-sided hemiparesis, diabetes mellitus, seizure not on antiepileptics, COPD was found to be acutely confused by patient's daughter.  Patient's daughter states that she left home early in the morning around 2 and came back around 4:30 PM when patient's mother was found to be confused and has had difficulty speaking.  MRI 6/19 showed evoluation of R ACA stroke noting that the "area of infarction is increased in size from the 12/17/2018 MRI and there are new areas of reduced diffusion indicating interval ischemia."      SLP Plan  Continue with current plan of care       Recommendations                   Follow up Recommendations: Skilled Nursing facility SLP Visit Diagnosis: Aphasia (R47.01) Plan: Continue with current plan of care       [place] 01/17/2019, 12:45 PM

## 2019-01-17 NOTE — Progress Notes (Signed)
Called report for [employer], Therapist, sports at [facility]

## 2019-01-17 NOTE — Care Management Important Message (Signed)
Important Message  Patient Details  Name: Crystal Moore MRN: [id number] Date of Birth: 09-Nov-1945   Medicare Important Message Given:  Yes     [name] 01/17/2019, 12:40 PM

## 2019-01-17 NOTE — Progress Notes (Signed)
Patient discharged from unit by [facility]

## 2019-03-16 ENCOUNTER — Other Ambulatory Visit: Payer: Self-pay

## 2019-03-16 DIAGNOSIS — I6529 Occlusion and stenosis of unspecified carotid artery: Secondary | ICD-10-CM

## 2019-03-22 ENCOUNTER — Encounter (HOSPITAL_COMMUNITY): Payer: Medicare Other

## 2019-03-22 ENCOUNTER — Ambulatory Visit: Payer: Medicare Other | Admitting: Vascular Surgery

## 2019-03-22 ENCOUNTER — Encounter: Payer: Self-pay | Admitting: Family

## 2019-08-11 DIAGNOSIS — F0390 Unspecified dementia without behavioral disturbance: Secondary | ICD-10-CM | POA: Insufficient documentation

## 2019-08-11 DIAGNOSIS — S72002A Fracture of unspecified part of neck of left femur, initial encounter for closed fracture: Secondary | ICD-10-CM | POA: Insufficient documentation

## 2019-08-11 DIAGNOSIS — U071 COVID-19: Secondary | ICD-10-CM | POA: Insufficient documentation

## 2019-08-28 ENCOUNTER — Encounter: Payer: Self-pay | Admitting: Neurology

## 2019-11-07 ENCOUNTER — Encounter: Payer: Self-pay | Admitting: Neurology

## 2019-11-07 ENCOUNTER — Ambulatory Visit (INDEPENDENT_AMBULATORY_CARE_PROVIDER_SITE_OTHER): Payer: Medicare Other | Admitting: Neurology

## 2019-11-07 ENCOUNTER — Other Ambulatory Visit: Payer: Self-pay

## 2019-11-07 VITALS — BP 170/74 | HR 67 | Ht 62.0 in | Wt 129.8 lb

## 2019-11-07 DIAGNOSIS — F0151 Vascular dementia with behavioral disturbance: Secondary | ICD-10-CM

## 2019-11-07 DIAGNOSIS — Z8673 Personal history of transient ischemic attack (TIA), and cerebral infarction without residual deficits: Secondary | ICD-10-CM

## 2019-11-07 DIAGNOSIS — F01518 Vascular dementia, unspecified severity, with other behavioral disturbance: Secondary | ICD-10-CM

## 2019-11-07 MED ORDER — ESCITALOPRAM OXALATE 10 MG PO TABS: 10.0000 mg | ORAL_TABLET | Freq: Every day | ORAL | 11 refills | Status: DC
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
## 2019-11-07 NOTE — Patient Instructions (Signed)
1. Start Lexapro 10mg  every night  2. Continue 24/7 care  3. Follow-up in 6 months, call for any changes   FALL PRECAUTIONS: Be cautious when walking. Scan the area for obstacles that may increase the risk of trips and falls. When getting up in the mornings, sit up at the edge of the bed for a few minutes before getting out of bed. Consider elevating the bed at the head end to avoid drop of blood pressure when getting up. Walk always in a well-lit room (use night lights in the walls). Avoid area rugs or power cords from appliances in the middle of the walkways. Use a walker or a cane if necessary and consider physical therapy for balance exercise. Get your eyesight checked regularly.   HOME SAFETY: Consider the safety of the kitchen when operating appliances like stoves, microwave oven, and blender. Consider having supervision and share cooking responsibilities until no longer able to participate in those. Accidents with firearms and other hazards in the house should be identified and addressed as well.   ABILITY TO BE LEFT ALONE: If patient is unable to contact 911 operator, consider using LifeLine, or when the need is there, arrange for someone to stay with patients. Smoking is a fire hazard, consider supervision or cessation. Risk of wandering should be assessed by caregiver and if detected at any point, supervision and safe proof recommendations should be instituted.   RECOMMENDATIONS FOR ALL PATIENTS WITH MEMORY PROBLEMS: 1. Continue to exercise (Recommend 30 minutes of walking everyday, or 3 hours every week) 2. Increase social interactions - continue going to [place] and enjoy social gatherings with friends and family 3. Eat healthy, avoid fried foods and eat more fruits and vegetables 4. Maintain adequate blood pressure, blood sugar, and blood cholesterol level. Reducing the risk of stroke and cardiovascular disease also helps promoting better memory. 5. Avoid stressful situations. Live a  simple life and avoid aggravations. Organize your time and prepare for the next day in anticipation. 6. Sleep well, avoid any interruptions of sleep and avoid any distractions in the bedroom that may interfere with adequate sleep quality 7. Avoid sugar, avoid sweets as there is a strong link between excessive sugar intake, diabetes, and cognitive impairment The Mediterranean diet has been shown to help patients reduce the risk of progressive memory disorders and reduces cardiovascular risk. This includes eating fish, eat fruits and green leafy vegetables, nuts like almonds and hazelnuts, walnuts, and also use olive oil. Avoid fast foods and fried foods as much as possible. Avoid sweets and sugar as sugar use has been linked to worsening of memory function.  There is always a concern of gradual progression of memory problems. If this is the case, then we may need to adjust level of care according to patient needs. Support, both to the patient and caregiver, should then be put into place.

## 2020-05-03 ENCOUNTER — Ambulatory Visit
Admission: RE | Admit: 2020-05-03 | Discharge: 2020-05-03 | Disposition: A | Discharge status: Home / Self Care | Payer: Medicare Other | Source: Ambulatory Visit | Attending: Physician Assistant | Admitting: Physician Assistant

## 2020-05-03 ENCOUNTER — Other Ambulatory Visit: Payer: Self-pay | Admitting: Physician Assistant

## 2020-05-03 DIAGNOSIS — E11621 Type 2 diabetes mellitus with foot ulcer: Secondary | ICD-10-CM

## 2020-05-03 DIAGNOSIS — L97509 Non-pressure chronic ulcer of other part of unspecified foot with unspecified severity: Secondary | ICD-10-CM

## 2020-05-21 ENCOUNTER — Ambulatory Visit (INDEPENDENT_AMBULATORY_CARE_PROVIDER_SITE_OTHER): Payer: Medicare Other | Admitting: Endocrinology

## 2020-05-21 ENCOUNTER — Other Ambulatory Visit: Payer: Self-pay

## 2020-05-21 ENCOUNTER — Ambulatory Visit: Payer: Medicare Other | Admitting: Endocrinology

## 2020-05-21 VITALS — BP 120/70 | HR 94 | Ht 62.0 in | Wt 131.2 lb

## 2020-05-21 DIAGNOSIS — Z794 Long term (current) use of insulin: Secondary | ICD-10-CM

## 2020-05-21 DIAGNOSIS — E1159 Type 2 diabetes mellitus with other circulatory complications: Secondary | ICD-10-CM | POA: Diagnosis not present

## 2020-05-21 LAB — POCT GLYCOSYLATED HEMOGLOBIN (HGB A1C): Hemoglobin A1C: 11.2 % — AB (ref 4.0–5.6)

## 2020-05-21 LAB — BASIC METABOLIC PANEL
BUN: 13 mg/dL (ref 6–23)
CO2: 32 mEq/L (ref 19–32)
Calcium: 9.8 mg/dL (ref 8.4–10.5)
Chloride: 97 mEq/L (ref 96–112)
Creatinine, Ser: 0.77 mg/dL (ref 0.40–1.20)
GFR: 76.4 mL/min (ref >60.00)
Glucose, Bld: 312 mg/dL — ABNORMAL HIGH (ref 70–99)
Potassium: 3.9 mEq/L (ref 3.5–5.1)
Sodium: 135 mEq/L (ref 135–145)

## 2020-05-21 LAB — T4, FREE: Free T4: 0.97 ng/dL (ref 0.60–1.60)

## 2020-05-21 LAB — TSH: TSH: 1.78 u[IU]/mL (ref 0.35–4.50)

## 2020-05-21 MED ORDER — ACCU-CHEK GUIDE VI STRP: 1.0000 | ORAL_STRIP | Freq: Two times a day (BID) | 3 refills | Status: DC

## 2020-05-21 MED ORDER — TRESIBA 100 UNIT/ML ~~LOC~~ SOLN: 20.0000 [IU] | Freq: Every day | SUBCUTANEOUS | 11 refills | Status: DC

## 2020-05-21 NOTE — Patient Instructions (Addendum)
check your blood sugar twice a day.  vary the time of day when you check, between before the 3 meals, and at bedtime.  also check if you have symptoms of your blood sugar being too high or too low.  please keep a record of the readings and bring it to your next appointment here (or you can bring the meter itself).  You can write it on any piece of paper.  please call us sooner if your blood sugar goes below 70, or if you have a lot of readings over 200. Please increase the Tesiba to 20 units daily.  If you miss the shot, take as soon as you remember.   Please continue the same Tradjenta.  However, we'll phase this out when your blood sugar is better.  Blood tests are requested for you today.  We'll let you know about the results.  Keep the ulcer covered with antibiotic ointment and large bandaids, until it heals.  Please come back for a follow-up appointment in 1 month.

## 2020-05-21 NOTE — Progress Notes (Signed)
Subjective:    Patient ID: Crystal Moore, female    DOB: [birth year], 74 y.o.   MRN: [id number]  HPI Pt returns for f/u of diabetes mellitus: DM type: Insulin-requiring type 2 Dx'ed: 5621 Complications: CVA Therapy: insulin since soon after dx GDM: never DKA: never Severe hypoglycemia: never Pancreatitis: never Pancreatic imaging: normal on 2019 CT SDOH: dtr gives pt her insulin and checks cbg, but this is sometimes misses, due to schedule; due to memory loss, she is not a candidate for multiple daily injections  Other: she is not a candidate for multiple daily injections.   Interval history: she takes [place] 16/d, and [place].  History is from dtr Crystal Moore).  no cbg record, but states cbg's vary from 143-412. There is no trend throughout the day.   Past Medical History:  Diagnosis Date  . Arthritis   . COPD (chronic obstructive pulmonary disease) ([place])   . CVA (cerebral vascular accident) ([place])   . Diabetes ([place])   . Diabetes mellitus without complication ([place])   . Hemiparesis affecting left side as late effect of cerebrovascular accident (CVA) ([place])   . Pyelonephritis   . Seizures ([place])   . Stroke ([place])   . Tobacco use   . Tobacco use disorder   . UTI (urinary tract infection)     Past Surgical History:  Procedure Laterality Date  . TUBAL LIGATION      Social History   Socioeconomic History  . Marital status: Single    Spouse name: Not on file  . Number of children: Not on file  . Years of education: Not on file  . Highest education level: Not on file  Occupational History  . Not on file  Tobacco Use  . Smoking status: Current Every Day Smoker    Packs/day: 1.00    Years: 40.00    Pack years: 40.00    Types: Cigarettes  . Smokeless tobacco: Never Used  Vaping Use  . Vaping Use: Never used  Substance and Sexual Activity  . Alcohol use: Never  . Drug use: Never  . Sexual activity: Not on file  Other Topics Concern  . Not on file  Social  History Narrative   ** Merged History Encounter **   Right handed    Lives with family        Social Determinants of Health   Financial Resource Strain:   . Difficulty of Paying Living Expenses: Not on file  Food Insecurity:   . Worried About Charity fundraiser in the Last Year: Not on file  . Ran Out of Food in the Last Year: Not on file  Transportation Needs:   . Lack of Transportation (Medical): Not on file  . Lack of Transportation (Non-Medical): Not on file  Physical Activity:   . Days of Exercise per Week: Not on file  . Minutes of Exercise per Session: Not on file  Stress:   . Feeling of Stress : Not on file  Social Connections:   . Frequency of Communication with Friends and Family: Not on file  . Frequency of Social Gatherings with Friends and Family: Not on file  . Attends Religious Services: Not on file  . Active Member of Clubs or Organizations: Not on file  . Attends Archivist Meetings: Not on file  . Marital Status: Not on file  Intimate Partner Violence:   . Fear of Current or Ex-Partner: Not on file  . Emotionally Abused: Not on file  .  Physically Abused: Not on file  . Sexually Abused: Not on file    Current Outpatient Medications on File Prior to Visit  Medication Sig Dispense Refill  . aspirin 81 MG EC tablet Take by mouth.    Marland Kitchen albuterol (PROVENTIL) (2.5 MG/3ML) 0.083% nebulizer solution Inhale 3 mLs (2.5 mg total) into the lungs every 4 (four) hours as needed for wheezing or shortness of breath. 75 mL 12  . amLODipine (NORVASC) 5 MG tablet Take 5 mg by mouth daily.    Marland Kitchen atorvastatin (LIPITOR) 80 MG tablet Take 1 tablet (80 mg total) by mouth daily at 6 PM. 30 tablet 0  . clopidogrel (PLAVIX) 75 MG tablet Take 1 tablet (75 mg total) by mouth daily. 30 tablet 0  . escitalopram (LEXAPRO) 10 MG tablet Take 1 tablet (10 mg total) by mouth at bedtime. 30 tablet 11  . feeding supplement, ENSURE ENLIVE, (ENSURE ENLIVE) LIQD Take 237 mLs by mouth 2  (two) times daily between meals. 237 mL 12  . TRADJENTA 5 MG TABS tablet Take 5 mg by mouth daily.     No current facility-administered medications on file prior to visit.    No Known Allergies  Family History  Problem Relation Age of Onset  . Diabetes Mother   . Diabetes Sister   . Diabetes Brother     BP 120/70   Pulse 94   Ht 5\' 2"  (1.575 m)   Wt 131 lb 3.2 oz (59.5 kg)   SpO2 100%   BMI 24.00 kg/m    Review of Systems She has lost weight since last ov here.      Objective:   Physical Exam VITAL SIGNS:  See vs page GENERAL: no distress Pulses: dorsalis pedis intact bilat.   MSK: no deformity of the feet CV: no leg edema Skin:  Shallow ulcer at the left lat malleolus (no drainage/erythema/swell).  normal color and temp on the feet. Neuro: sensation is intact to touch on the feet.    Lab Results  Component Value Date   HGBA1C 11.2 (A) 05/21/2020       Assessment & Plan:  Ankle ulcer, new Insulin-requiring type 2 DM, with CVA: uncontrolled.   Patient Instructions  check your blood sugar twice a day.  vary the time of day when you check, between before the 3 meals, and at bedtime.  also check if you have symptoms of your blood sugar being too high or too low.  please keep a record of the readings and bring it to your next appointment here (or you can bring the meter itself).  You can write it on any piece of paper.  please call us sooner if your blood sugar goes below 70, or if you have a lot of readings over 200. Please increase the Tesiba to 20 units daily.  If you miss the shot, take as soon as you remember.   Please continue the same Tradjenta.  However, we'll phase this out when your blood sugar is better.  Blood tests are requested for you today.  We'll let you know about the results.  Keep the ulcer covered with antibiotic ointment and large bandaids, until it heals.  Please come back for a follow-up appointment in 1 month.

## 2020-05-22 ENCOUNTER — Telehealth: Payer: Self-pay

## 2020-05-22 NOTE — Telephone Encounter (Signed)
Patient aware of results.

## 2020-05-22 NOTE — Telephone Encounter (Signed)
-----   Message from Renato Shin, MD sent at 05/21/2020  7:02 PM EDT ----- please contact patient: Normal except for blood sugar

## 2020-05-28 ENCOUNTER — Ambulatory Visit: Payer: Medicare Other | Admitting: Neurology

## 2020-06-26 ENCOUNTER — Ambulatory Visit: Payer: Medicare Other | Admitting: Endocrinology

## 2020-06-28 ENCOUNTER — Encounter: Payer: Self-pay | Admitting: Endocrinology

## 2020-06-28 ENCOUNTER — Other Ambulatory Visit: Payer: Self-pay

## 2020-06-28 ENCOUNTER — Ambulatory Visit (INDEPENDENT_AMBULATORY_CARE_PROVIDER_SITE_OTHER): Payer: Medicare Other | Admitting: Endocrinology

## 2020-06-28 VITALS — BP 162/82 | HR 91 | Ht 62.0 in | Wt 135.8 lb

## 2020-06-28 DIAGNOSIS — L97321 Non-pressure chronic ulcer of left ankle limited to breakdown of skin: Secondary | ICD-10-CM | POA: Diagnosis not present

## 2020-06-28 DIAGNOSIS — Z794 Long term (current) use of insulin: Secondary | ICD-10-CM | POA: Diagnosis not present

## 2020-06-28 DIAGNOSIS — E1159 Type 2 diabetes mellitus with other circulatory complications: Secondary | ICD-10-CM

## 2020-06-28 DIAGNOSIS — L97309 Non-pressure chronic ulcer of unspecified ankle with unspecified severity: Secondary | ICD-10-CM | POA: Insufficient documentation

## 2020-06-28 LAB — POCT GLYCOSYLATED HEMOGLOBIN (HGB A1C): Hemoglobin A1C: 10.2 % — AB (ref 4.0–5.6)

## 2020-06-28 MED ORDER — ENSURE ENLIVE PO LIQD: 237.0000 mL | Freq: Two times a day (BID) | ORAL | 12 refills | Status: AC

## 2020-06-28 MED ORDER — TRESIBA 100 UNIT/ML ~~LOC~~ SOLN: 25.0000 [IU] | Freq: Every day | SUBCUTANEOUS | 11 refills | Status: DC

## 2020-06-28 NOTE — Patient Instructions (Addendum)
Your blood pressure is high today.  Please see your primary care provider soon, to have it rechecked. check your blood sugar twice a day.  vary the time of day when you check, between before the 3 meals, and at bedtime.  also check if you have symptoms of your blood sugar being too high or too low.  please keep a record of the readings and bring it to your next appointment here (or you can bring the meter itself).  You can write it on any piece of paper.  please call us sooner if your blood sugar goes below 70, or if you have a lot of readings over 200. Please increase the Tesiba to 25 units daily.  If you miss the shot, take as soon as you remember.   we'll phase out the tradjenta when your blood sugar is better.  Please see a wound specialist.  you will receive a phone call, about a day and time for an appointment.   Please come back for a follow-up appointment in 6-8 weeks.

## 2020-06-28 NOTE — Progress Notes (Signed)
Subjective:    Patient ID: Crystal Moore, female    DOB: 04-Jul-1946, 74 y.o.   MRN: [id number]  HPI Pt returns for f/u of diabetes mellitus: DM type: Insulin-requiring type 2 Dx'ed: 6644 Complications: CVA Therapy: insulin since soon after dx GDM: never DKA: never Severe hypoglycemia: never Pancreatitis: never Pancreatic imaging: normal on 2019 CT SDOH: dtr gives pt her insulin and checks cbg, but this is sometimes misses, due to schedule; due to memory loss, she is not a candidate for multiple daily injections  Other: none Interval history: History is from dtr Crystal Moore).  no cbg record, but states cbg's vary from 162-416. There is no trend throughout the day.  Past Medical History:  Diagnosis Date  . Arthritis   . COPD (chronic obstructive pulmonary disease) ([place])   . CVA (cerebral vascular accident) ([place])   . Diabetes ([place])   . Diabetes mellitus without complication ([place])   . Hemiparesis affecting left side as late effect of cerebrovascular accident (CVA) ([place])   . Pyelonephritis   . Seizures ([place])   . Stroke ([place])   . Tobacco use   . Tobacco use disorder   . UTI (urinary tract infection)     Past Surgical History:  Procedure Laterality Date  . TUBAL LIGATION      Social History   Socioeconomic History  . Marital status: Single    Spouse name: Not on file  . Number of children: Not on file  . Years of education: Not on file  . Highest education level: Not on file  Occupational History  . Not on file  Tobacco Use  . Smoking status: Current Every Day Smoker    Packs/day: 1.00    Years: 40.00    Pack years: 40.00    Types: Cigarettes  . Smokeless tobacco: Never Used  Vaping Use  . Vaping Use: Never used  Substance and Sexual Activity  . Alcohol use: Never  . Drug use: Never  . Sexual activity: Not on file  Other Topics Concern  . Not on file  Social History Narrative   ** Merged History Encounter **   Right handed    Lives with family         Social Determinants of Health   Financial Resource Strain:   . Difficulty of Paying Living Expenses: Not on file  Food Insecurity:   . Worried About Charity fundraiser in the Last Year: Not on file  . Ran Out of Food in the Last Year: Not on file  Transportation Needs:   . Lack of Transportation (Medical): Not on file  . Lack of Transportation (Non-Medical): Not on file  Physical Activity:   . Days of Exercise per Week: Not on file  . Minutes of Exercise per Session: Not on file  Stress:   . Feeling of Stress : Not on file  Social Connections:   . Frequency of Communication with Friends and Family: Not on file  . Frequency of Social Gatherings with Friends and Family: Not on file  . Attends Religious Services: Not on file  . Active Member of Clubs or Organizations: Not on file  . Attends Archivist Meetings: Not on file  . Marital Status: Not on file  Intimate Partner Violence:   . Fear of Current or Ex-Partner: Not on file  . Emotionally Abused: Not on file  . Physically Abused: Not on file  . Sexually Abused: Not on file    Current Outpatient  Medications on File Prior to Visit  Medication Sig Dispense Refill  . albuterol (PROVENTIL) (2.5 MG/3ML) 0.083% nebulizer solution Inhale 3 mLs (2.5 mg total) into the lungs every 4 (four) hours as needed for wheezing or shortness of breath. 75 mL 12  . amLODipine (NORVASC) 5 MG tablet Take 5 mg by mouth daily.    Marland Kitchen aspirin 81 MG EC tablet Take by mouth.    Marland Kitchen atorvastatin (LIPITOR) 80 MG tablet Take 1 tablet (80 mg total) by mouth daily at 6 PM. 30 tablet 0  . clopidogrel (PLAVIX) 75 MG tablet Take 1 tablet (75 mg total) by mouth daily. 30 tablet 0  . escitalopram (LEXAPRO) 10 MG tablet Take 1 tablet (10 mg total) by mouth at bedtime. 30 tablet 11  . glucose blood (ACCU-CHEK GUIDE) test strip 1 each by Other route in the morning and at bedtime. And lancets 2/day 200 each 3  . TRADJENTA 5 MG TABS tablet Take 5 mg by mouth  daily.     No current facility-administered medications on file prior to visit.    No Known Allergies  Family History  Problem Relation Age of Onset  . Diabetes Mother   . Diabetes Sister   . Diabetes Brother     BP (!) 162/82   Pulse 91   Ht 5\' 2"  (1.575 m)   Wt 135 lb 12.8 oz (61.6 kg)   SpO2 90%   BMI 24.84 kg/m    Review of Systems She denies hypoglycemia.  She has gained a few lbs.      Objective:   Physical Exam VITAL SIGNS:  See vs page GENERAL: no distress Pulses: dorsalis pedis intact bilat.   MSK: no deformity of the feet CV: no leg edema Skin:  Shallow ulcer at the left lat malleolus (no drainage/erythema/swell) is again noted.  normal color and temp on the feet. Neuro: sensation is intact to touch on the feet.   Lab Results  Component Value Date   HGBA1C 10.2 (A) 06/28/2020       Assessment & Plan:  Insulin-requiring type 2 DM, with CVA: uncontrolled.  HTN: is noted today Ankle ulcer: persistent  Patient Instructions  Your blood pressure is high today.  Please see your primary care provider soon, to have it rechecked. check your blood sugar twice a day.  vary the time of day when you check, between before the 3 meals, and at bedtime.  also check if you have symptoms of your blood sugar being too high or too low.  please keep a record of the readings and bring it to your next appointment here (or you can bring the meter itself).  You can write it on any piece of paper.  please call us sooner if your blood sugar goes below 70, or if you have a lot of readings over 200. Please increase the Tesiba to 25 units daily.  If you miss the shot, take as soon as you remember.   we'll phase out the tradjenta when your blood sugar is better.  Please see a wound specialist.  you will receive a phone call, about a day and time for an appointment.   Please come back for a follow-up appointment in 6-8 weeks.

## 2020-07-27 ENCOUNTER — Other Ambulatory Visit: Payer: Self-pay

## 2020-07-27 ENCOUNTER — Emergency Department (HOSPITAL_COMMUNITY)
Admission: EM | Admit: 2020-07-27 | Discharge: 2020-07-28 | Disposition: A | Discharge status: Home / Self Care | Payer: Medicare Other | Attending: Emergency Medicine | Admitting: Emergency Medicine

## 2020-07-27 DIAGNOSIS — U071 COVID-19: Secondary | ICD-10-CM

## 2020-07-27 DIAGNOSIS — Z79899 Other long term (current) drug therapy: Secondary | ICD-10-CM | POA: Diagnosis not present

## 2020-07-27 DIAGNOSIS — Z7982 Long term (current) use of aspirin: Secondary | ICD-10-CM | POA: Diagnosis not present

## 2020-07-27 DIAGNOSIS — I10 Essential (primary) hypertension: Secondary | ICD-10-CM | POA: Insufficient documentation

## 2020-07-27 DIAGNOSIS — Z7902 Long term (current) use of antithrombotics/antiplatelets: Secondary | ICD-10-CM | POA: Insufficient documentation

## 2020-07-27 DIAGNOSIS — E1165 Type 2 diabetes mellitus with hyperglycemia: Secondary | ICD-10-CM | POA: Insufficient documentation

## 2020-07-27 DIAGNOSIS — R739 Hyperglycemia, unspecified: Secondary | ICD-10-CM | POA: Diagnosis present

## 2020-07-27 DIAGNOSIS — J449 Chronic obstructive pulmonary disease, unspecified: Secondary | ICD-10-CM | POA: Diagnosis not present

## 2020-07-27 DIAGNOSIS — F17210 Nicotine dependence, cigarettes, uncomplicated: Secondary | ICD-10-CM | POA: Diagnosis not present

## 2020-07-27 DIAGNOSIS — Z8673 Personal history of transient ischemic attack (TIA), and cerebral infarction without residual deficits: Secondary | ICD-10-CM | POA: Diagnosis not present

## 2020-07-27 HISTORY — DX: COVID-19: U07.1

## 2020-07-27 LAB — RESP PANEL BY RT-PCR (FLU A&B, COVID) ARPGX2
Influenza A by PCR: NEGATIVE
Influenza B by PCR: NEGATIVE
SARS Coronavirus 2 by RT PCR: POSITIVE — AB

## 2020-07-27 LAB — BASIC METABOLIC PANEL
Anion gap: 10 (ref 5–15)
BUN: 18 mg/dL (ref 8–23)
CO2: 26 mmol/L (ref 22–32)
Calcium: 9 mg/dL (ref 8.9–10.3)
Chloride: 99 mmol/L (ref 98–111)
Creatinine, Ser: 1.05 mg/dL — ABNORMAL HIGH (ref 0.44–1.00)
GFR, Estimated: 56 mL/min — ABNORMAL LOW (ref >60)
Glucose, Bld: 316 mg/dL — ABNORMAL HIGH (ref 70–99)
Potassium: 3.6 mmol/L (ref 3.5–5.1)
Sodium: 135 mmol/L (ref 135–145)

## 2020-07-27 LAB — CBC
HCT: 33.8 % — ABNORMAL LOW (ref 36.0–46.0)
Hemoglobin: 10.3 g/dL — ABNORMAL LOW (ref 12.0–15.0)
MCH: 24.6 pg — ABNORMAL LOW (ref 26.0–34.0)
MCHC: 30.5 g/dL (ref 30.0–36.0)
MCV: 80.7 fL (ref 80.0–100.0)
Platelets: 267 10*3/uL (ref 150–400)
RBC: 4.19 MIL/uL (ref 3.87–5.11)
RDW: 15.9 % — ABNORMAL HIGH (ref 11.5–15.5)
WBC: 9.4 10*3/uL (ref 4.0–10.5)
nRBC: 0 % (ref 0.0–0.2)

## 2020-07-27 LAB — CBG MONITORING, ED
Glucose-Capillary: 291 mg/dL — ABNORMAL HIGH (ref 70–99)
Glucose-Capillary: 256 mg/dL — ABNORMAL HIGH (ref 70–99)

## 2020-07-27 NOTE — ED Provider Notes (Addendum)
[facility] EMERGENCY DEPARTMENT Provider Note   CSN: [id number] Arrival date & time: 07/27/20  2016     History Chief Complaint  Patient presents with  . Hyperglycemia    Crystal Moore is a 75 y.o. female.  Patient brought to the emergency department for evaluation of high blood sugar.  Patient's daughter is her caregiver, reports her blood sugar was over 400 at home today.  Patient was also noted to be slightly confused earlier today.  She did have a fever as well.  She was exposed to Covid by her granddaughter yesterday.  Patient has not been having any cough, congestion or difficulty breathing.        Past Medical History:  Diagnosis Date  . Arthritis   . COPD (chronic obstructive pulmonary disease) ([facility])   . CVA (cerebral vascular accident) ([facility])   . Diabetes ([facility])   . Diabetes mellitus without complication ([facility])   . Hemiparesis affecting left side as late effect of cerebrovascular accident (CVA) ([facility])   . Pyelonephritis   . Seizures ([facility])   . Stroke ([facility])   . Tobacco use   . Tobacco use disorder   . UTI (urinary tract infection)     Patient Active Problem List   Diagnosis Date Noted  . Ankle ulcer ([facility]) 06/28/2020  . AKI (acute kidney injury) ([facility]) 01/15/2019  . Cerebral thrombosis with cerebral infarction 01/15/2019  . TIA (transient ischemic attack) 01/13/2019  . Type 2 diabetes mellitus with vascular disease ([facility]) 01/13/2019  . Hypokalemia 12/18/2018  . Acute CVA (cerebrovascular accident) ([facility]) 12/17/2018  . Malnutrition of moderate degree 05/31/2018  . Hematochezia 05/29/2018  . Ischemic colitis ([facility]) 05/29/2018  . Acute encephalopathy 05/29/2018  . Type 2 diabetes mellitus ([facility]) 05/29/2018  . Hypertension 05/29/2018  . Depression 05/29/2018  . COPD (chronic obstructive pulmonary disease) ([facility]) 05/29/2018  . Sepsis ([facility]) 05/28/2018  . Diabetes ([facility]) 03/16/2018  . ILD (interstitial lung disease) ([facility]) 02/18/2018  . Chronic  respiratory failure with hypoxia ([facility]) 02/18/2018  . COPD (chronic obstructive pulmonary disease) ([facility]) 01/12/2018  . Tobacco abuse 01/12/2018    Past Surgical History:  Procedure Laterality Date  . TUBAL LIGATION       OB History   No obstetric history on file.     Family History  Problem Relation Age of Onset  . Diabetes Mother   . Diabetes Sister   . Diabetes Brother     Social History   Tobacco Use  . Smoking status: Current Every Day Smoker    Packs/day: 1.00    Years: 40.00    Pack years: 40.00    Types: Cigarettes  . Smokeless tobacco: Never Used  Vaping Use  . Vaping Use: Never used  Substance Use Topics  . Alcohol use: Never  . Drug use: Never    Home Medications Prior to Admission medications   Medication Sig Start Date End Date Taking? Authorizing Provider  albuterol (PROVENTIL) (2.5 MG/3ML) 0.083% nebulizer solution Inhale 3 mLs (2.5 mg total) into the lungs every 4 (four) hours as needed for wheezing or shortness of breath. 01/17/19   Marlin Canary U, DO  amLODipine (NORVASC) 5 MG tablet Take 5 mg by mouth daily. 09/25/19   [provider]  aspirin 81 MG EC tablet Take by mouth. 11/07/15   [provider]  atorvastatin (LIPITOR) 80 MG tablet Take 1 tablet (80 mg total) by mouth daily at 6 PM. 12/21/18   Edsel Petrin, DO  clopidogrel (PLAVIX)  75 MG tablet Take 1 tablet (75 mg total) by mouth daily. 12/22/18   Mikhail, Nita Sells, DO  escitalopram (LEXAPRO) 10 MG tablet Take 1 tablet (10 mg total) by mouth at bedtime. 11/07/19   Van Clines, MD  feeding supplement (ENSURE ENLIVE / ENSURE PLUS) LIQD Take 237 mLs by mouth 2 (two) times daily between meals. 06/28/20   Romero Belling, MD  glucose blood (ACCU-CHEK GUIDE) test strip 1 each by Other route in the morning and at bedtime. And lancets 2/day 05/21/20   Romero Belling, MD  Insulin Degludec (TRESIBA) 100 UNIT/ML SOLN Inject 25 Units into the skin daily. 06/28/20   Romero Belling, MD   TRADJENTA 5 MG TABS tablet Take 5 mg by mouth daily. 10/24/19   [provider]    Allergies    Patient has no known allergies.  Review of Systems   Review of Systems  Constitutional: Positive for fever.  Respiratory: Negative for cough and shortness of breath.   Psychiatric/Behavioral: Positive for confusion.  All other systems reviewed and are negative.   Physical Exam Updated Vital Signs BP 130/60 (BP Location: Right Arm)   Pulse 79   Temp 97.8 F (36.6 C) (Oral)   Resp 18   SpO2 97%   Physical Exam Vitals and nursing note reviewed.  Constitutional:      General: She is not in acute distress.    Appearance: Normal appearance. She is well-developed and well-nourished.  HENT:     Head: Normocephalic and atraumatic.     Right Ear: Hearing normal.     Left Ear: Hearing normal.     Nose: Nose normal.     Mouth/Throat:     Mouth: Oropharynx is clear and moist and mucous membranes are normal.  Eyes:     Extraocular Movements: EOM normal.     Conjunctiva/sclera: Conjunctivae normal.     Pupils: Pupils are equal, round, and reactive to light.  Cardiovascular:     Rate and Rhythm: Regular rhythm.     Heart sounds: S1 normal and S2 normal. No murmur heard. No friction rub. No gallop.   Pulmonary:     Effort: Pulmonary effort is normal. No respiratory distress.     Breath sounds: Normal breath sounds.  Chest:     Chest wall: No tenderness.  Abdominal:     General: Bowel sounds are normal.     Palpations: Abdomen is soft. There is no hepatosplenomegaly.     Tenderness: There is no abdominal tenderness. There is no guarding or rebound. Negative signs include Murphy's sign and McBurney's sign.     Hernia: No hernia is present.  Musculoskeletal:        General: Normal range of motion.     Cervical back: Normal range of motion and neck supple.  Skin:    General: Skin is warm, dry and intact.     Findings: No rash.     Nails: There is no cyanosis.  Neurological:      Mental Status: She is alert. Mental status is at baseline. She is disoriented.     GCS: GCS eye subscore is 4. GCS verbal subscore is 4. GCS motor subscore is 6.     Cranial Nerves: No cranial nerve deficit.     Sensory: No sensory deficit.     Coordination: Coordination normal.     Deep Tendon Reflexes: Strength normal.  Psychiatric:        Mood and Affect: Mood and affect normal.  Speech: Speech normal.        Behavior: Behavior normal.        Thought Content: Thought content normal.     ED Results / Procedures / Treatments   Labs (all labs ordered are listed, but only abnormal results are displayed) Labs Reviewed  RESP PANEL BY RT-PCR (FLU A&B, COVID) ARPGX2 - Abnormal; Notable for the following components:      Result Value   SARS Coronavirus 2 by RT PCR POSITIVE (*)    All other components within normal limits  BASIC METABOLIC PANEL - Abnormal; Notable for the following components:   Glucose, Bld 316 (*)    Creatinine, Ser 1.05 (*)    GFR, Estimated 56 (*)    All other components within normal limits  CBC - Abnormal; Notable for the following components:   Hemoglobin 10.3 (*)    HCT 33.8 (*)    MCH 24.6 (*)    RDW 15.9 (*)    All other components within normal limits  CBG MONITORING, ED - Abnormal; Notable for the following components:   Glucose-Capillary 291 (*)    All other components within normal limits  CBG MONITORING, ED - Abnormal; Notable for the following components:   Glucose-Capillary 256 (*)    All other components within normal limits  CBG MONITORING, ED - Abnormal; Notable for the following components:   Glucose-Capillary 241 (*)    All other components within normal limits  URINALYSIS, ROUTINE W REFLEX MICROSCOPIC    EKG None  Radiology No results found.  Procedures Procedures (including critical care time)  Medications Ordered in ED Medications - No data to display  ED Course  I have reviewed the triage vital signs and the  nursing notes.  Pertinent labs & imaging results that were available during my care of the patient were reviewed by me and considered in my medical decision making (see chart for details).    MDM Rules/Calculators/A&P                         Patient's caregiver, her daughter, reports that the patient has been running a fever and has been confused.  She had a positive Covid contact and did test positive for Covid here.  Lungs are clear, oxygenation is normal.  I do not suspect Covid pneumonitis at this time.  Offered x-ray to daughter but she did not wish to have the x-ray.  Lab work was unremarkable otherwise.  I did recommend urinalysis to rule out UTI as a cause of her infection, but patient could not give urine.  Discussed with daughter waiting longer for urine versus follow-up with primary.  Daughter comfortable following up as an outpatient if needed.  I do suspect all of the symptoms are secondary to the Covid and as she is not hypoxic, can be discharged.  Blood sugar is improved daughter gave insulin before coming to ER.   Final Clinical Impression(s) / ED Diagnoses Final diagnoses:  COVID-19    Rx / DC Orders ED Discharge Orders    None       Jewelia Bocchino, Canary Brim, MD 07/28/20 [id number]    Gilda Crease, MD 07/28/20 0030

## 2020-07-27 NOTE — ED Notes (Signed)
Moved to COVID positive area.

## 2020-07-27 NOTE — ED Triage Notes (Addendum)
Pt presents to ED POV. Pt c/o hyperglycemia and AMS. Triage information given by daughter. Pt normally AAO x3, disoriented to time. Pt oriented to time and self currently. Per daughter home CBG - 447. No other neuro s/s.  Daughter also reports that pt was around covid + family member and was running fever last night of 101. Pt not vaccinated

## 2020-07-28 ENCOUNTER — Encounter: Payer: Self-pay | Admitting: Nurse Practitioner

## 2020-07-28 DIAGNOSIS — I639 Cerebral infarction, unspecified: Secondary | ICD-10-CM | POA: Insufficient documentation

## 2020-07-28 DIAGNOSIS — U071 COVID-19: Secondary | ICD-10-CM | POA: Diagnosis not present

## 2020-07-28 LAB — CBG MONITORING, ED: Glucose-Capillary: 241 mg/dL — ABNORMAL HIGH (ref 70–99)

## 2020-07-29 DIAGNOSIS — J439 Emphysema, unspecified: Secondary | ICD-10-CM | POA: Diagnosis not present

## 2020-08-08 ENCOUNTER — Encounter (HOSPITAL_BASED_OUTPATIENT_CLINIC_OR_DEPARTMENT_OTHER): Payer: Medicare Other | Attending: Internal Medicine | Admitting: Internal Medicine

## 2020-08-08 ENCOUNTER — Other Ambulatory Visit: Payer: Self-pay

## 2020-08-08 DIAGNOSIS — E11621 Type 2 diabetes mellitus with foot ulcer: Secondary | ICD-10-CM | POA: Diagnosis not present

## 2020-08-08 DIAGNOSIS — L97328 Non-pressure chronic ulcer of left ankle with other specified severity: Secondary | ICD-10-CM | POA: Insufficient documentation

## 2020-08-08 DIAGNOSIS — Z87891 Personal history of nicotine dependence: Secondary | ICD-10-CM | POA: Insufficient documentation

## 2020-08-08 DIAGNOSIS — F0150 Vascular dementia without behavioral disturbance: Secondary | ICD-10-CM | POA: Diagnosis not present

## 2020-08-08 DIAGNOSIS — E11622 Type 2 diabetes mellitus with other skin ulcer: Secondary | ICD-10-CM | POA: Insufficient documentation

## 2020-08-08 DIAGNOSIS — I70243 Atherosclerosis of native arteries of left leg with ulceration of ankle: Secondary | ICD-10-CM | POA: Diagnosis not present

## 2020-08-08 DIAGNOSIS — L97322 Non-pressure chronic ulcer of left ankle with fat layer exposed: Secondary | ICD-10-CM | POA: Diagnosis not present

## 2020-08-08 DIAGNOSIS — L97511 Non-pressure chronic ulcer of other part of right foot limited to breakdown of skin: Secondary | ICD-10-CM | POA: Diagnosis not present

## 2020-08-08 DIAGNOSIS — L97929 Non-pressure chronic ulcer of unspecified part of left lower leg with unspecified severity: Secondary | ICD-10-CM | POA: Diagnosis present

## 2020-08-08 DIAGNOSIS — L97812 Non-pressure chronic ulcer of other part of right lower leg with fat layer exposed: Secondary | ICD-10-CM | POA: Diagnosis not present

## 2020-08-08 DIAGNOSIS — E1151 Type 2 diabetes mellitus with diabetic peripheral angiopathy without gangrene: Secondary | ICD-10-CM | POA: Insufficient documentation

## 2020-08-08 NOTE — Progress Notes (Signed)
Crystal Moore, Crystal Moore ([id number]) Visit Report for 08/08/2020 Allergy List Details Patient Name: Date of Service: Crystal Moore, Crystal Moore. 08/08/2020 10:30 A M Medical Record Number: [id number] Patient Account Number: [id number] Date of Birth/Sex: Treating RN: [birth year] (75 y.o. Nancy Fetter Primary Care Aylah Yeary: [name] Other Clinician: Referring Valisha Heslin: Treating Katti Pelle/Extender: Gabriel Rainwater in Treatment: 0 Allergies Active Allergies No Known Drug Allergies Type: Allergen Allergy Notes Electronic Signature(s) Signed: 08/08/2020 5:28:50 PM By: Levan Hurst RN, BSN Entered By: Levan Hurst on 08/08/2020 10:05:18 -------------------------------------------------------------------------------- Arrival Information Details Patient Name: Date of Service: Crystal Moore. 08/08/2020 10:30 A M Medical Record Number: [id number] Patient Account Number: [id number] Date of Birth/Sex: Treating RN: [birth year] (75 y.o. Nancy Fetter Primary Care Etoy Mcdonnell: [name] Other Clinician: Referring Shantea Poulton: Treating Ronal Maybury/Extender: Gabriel Rainwater in Treatment: 0 Visit Information Patient Arrived: Wheel Chair Arrival Time: 09:56 Accompanied By: grandson Transfer Assistance: None Patient Identification Verified: Yes Secondary Verification Process Completed: Yes Patient Requires Transmission-Based Precautions: No Patient Has Alerts: Yes Patient Alerts: Patient on Blood Thinner Electronic Signature(s) Signed: 08/08/2020 5:28:50 PM By: Levan Hurst RN, BSN Entered By: Levan Hurst on 08/08/2020 10:02:35 -------------------------------------------------------------------------------- Clinic Level of Care Assessment Details Patient Name: Date of Service: Crystal Moore, Crystal Moore. 08/08/2020 10:30 A M Medical Record Number: [id number] Patient Account Number: [id number] Date of Birth/Sex: Treating  RN: Dec 04, 1945 (75 y.o. Debby Bud Primary Care Pualani Borah: [name] Other Clinician: Referring Romayne Ticas: Treating Zoua Caporaso/Extender: Gabriel Rainwater in Treatment: 0 Clinic Level of Care Assessment Items TOOL 2 Quantity Score X- 1 0 Use when only an EandM is performed on the INITIAL visit ASSESSMENTS - Nursing Assessment / Reassessment X- 1 20 General Physical Exam (combine w/ comprehensive assessment (listed just below) when performed on new pt. evals) X- 1 25 Comprehensive Assessment (HX, ROS, Risk Assessments, Wounds Hx, etc.) ASSESSMENTS - Wound and Skin A ssessment / Reassessment []  - 0 Simple Wound Assessment / Reassessment - one wound X- 2 5 Complex Wound Assessment / Reassessment - multiple wounds X- 1 10 Dermatologic / Skin Assessment (not related to wound area) ASSESSMENTS - Ostomy and/or Continence Assessment and Care []  - 0 Incontinence Assessment and Management []  - 0 Ostomy Care Assessment and Management (repouching, etc.) PROCESS - Coordination of Care []  - 0 Simple Patient / Family Education for ongoing care X- 1 20 Complex (extensive) Patient / Family Education for ongoing care X- 1 10 Staff obtains Programmer, systems, Records, T Results / Process Orders est X- 1 10 Staff telephones HHA, Nursing Homes / Clarify orders / etc []  - 0 Routine Transfer to another Facility (non-emergent condition) []  - 0 Routine Hospital Admission (non-emergent condition) X- 1 15 New Admissions / Biomedical engineer / Ordering NPWT Apligraf, etc. , []  - 0 Emergency Hospital Admission (emergent condition) []  - 0 Simple Discharge Coordination X- 1 15 Complex (extensive) Discharge Coordination PROCESS - Special Needs []  - 0 Pediatric / Minor Patient Management []  - 0 Isolation Patient Management []  - 0 Hearing / Language / Visual special needs []  - 0 Assessment of Community assistance (transportation, D/C planning, etc.) []  -  0 Additional assistance / Altered mentation []  - 0 Support Surface(s) Assessment (bed, cushion, seat, etc.) INTERVENTIONS - Wound Cleansing / Measurement X- 1 5 Wound Imaging (photographs - any number of wounds) []  - 0 Wound Tracing (instead of photographs) []  - 0 Simple Wound Measurement - one wound X- 2 5 Complex Wound Measurement - multiple wounds []  -  0 Simple Wound Cleansing - one wound X- 2 5 Complex Wound Cleansing - multiple wounds INTERVENTIONS - Wound Dressings X - Small Wound Dressing one or multiple wounds 2 10 []  - 0 Medium Wound Dressing one or multiple wounds []  - 0 Large Wound Dressing one or multiple wounds []  - 0 Application of Medications - injection INTERVENTIONS - Miscellaneous []  - 0 External ear exam []  - 0 Specimen Collection (cultures, biopsies, blood, body fluids, etc.) []  - 0 Specimen(s) / Culture(s) sent or taken to Lab for analysis []  - 0 Patient Transfer (multiple staff / Harrel Lemon Lift / Similar devices) []  - 0 Simple Staple / Suture removal (25 or less) []  - 0 Complex Staple / Suture removal (26 or more) []  - 0 Hypo / Hyperglycemic Management (close monitor of Blood Glucose) X- 1 15 Ankle / Brachial Index (ABI) - do not check if billed separately Has the patient been seen at the hospital within the last three years: Yes Total Score: 195 Level Of Care: New/Established - Level 5 Electronic Signature(s) Signed: 08/08/2020 5:39:22 PM By: Deon Pilling Entered By: Deon Pilling on 08/08/2020 10:59:29 -------------------------------------------------------------------------------- Lower Extremity Assessment Details Patient Name: Date of Service: Crystal Moore, Crystal Moore. 08/08/2020 10:30 A M Medical Record Number: [id number] Patient Account Number: [id number] Date of Birth/Sex: Treating RN: [birth year] (75 y.o. Nancy Fetter Primary Care Royer Cristobal: [name] Other Clinician: Referring Hitesh Fouche: Treating Teon Hudnall/Extender: Gabriel Rainwater in Treatment: 0 Edema Assessment Assessed: [Left: No] [Right: No] Edema: [Left: No] [Right: No] Calf Left: Right: Point of Measurement: 27 cm From Medial Instep 28 cm 30 cm Ankle Left: Right: Point of Measurement: 11 cm From Medial Instep 19 cm 17.5 cm Knee To Floor Left: Right: From Medial Instep 39 cm 39 cm Vascular Assessment Pulses: Dorsalis Pedis Palpable: [Left:No] [Right:Yes] Blood Pressure: Brachial: [Left:161] [Right:161] Ankle: [Left:Dorsalis Pedis: 60 0.37] [Right:Dorsalis Pedis: 80 0.50] Electronic Signature(s) Signed: 08/08/2020 5:28:50 PM By: Levan Hurst RN, BSN Entered By: Levan Hurst on 08/08/2020 10:25:15 -------------------------------------------------------------------------------- Multi Wound Chart Details Patient Name: Date of Service: Crystal Grammes M. 08/08/2020 10:30 A M Medical Record Number: [id number] Patient Account Number: [id number] Date of Birth/Sex: Treating RN: November 16, 1945 (75 y.o. [name], [name] Primary Care Peggy Monk: [name] Other Clinician: Referring Gabrelle Roca: Treating Annebelle Bostic/Extender: Gabriel Rainwater in Treatment: 0 Vital Signs Height(in): Pulse(bpm): 112 Weight(lbs): Blood Pressure(mmHg): 161/69 Body Mass Index(BMI): Temperature(F): 97.5 Respiratory Rate(breaths/min): 16 Photos: [1:No Photos Left, Lateral Malleolus] [2:No Photos Right, Anterior Lower Leg] [N/A:N/A N/A] Wound Location: [1:Pressure Injury] [2:Trauma] [N/A:N/A] Wounding Event: [1:Arterial Insufficiency Ulcer] [2:Trauma, Other] [N/A:N/A] Primary Etiology: [1:Chronic Obstructive Pulmonary] [2:Chronic Obstructive Pulmonary] [N/A:N/A] Comorbid History: [1:Disease (COPD), Hypertension, Type Disease (COPD), Hypertension, Type II Diabetes, Osteoarthritis, Seizure II Diabetes, Osteoarthritis, Seizure Disorder 03/27/2020] [2:Disorder 06/26/2020] [N/A:N/A] Date Acquired: [1:0] [2:0]  [N/A:N/A] Weeks of Treatment: [1:Open] [2:Open] [N/A:N/A] Wound Status: [1:1.2x1x0.2] [2:0.8x0.6x0.1] [N/A:N/A] Measurements L x W x D (cm) [1:0.942] [2:0.377] [N/A:N/A] A (cm) : rea [1:0.188] [2:0.038] [N/A:N/A] Volume (cm) : [1:0.00%] [2:N/A] [N/A:N/A] % Reduction in A [1:rea: 0.00%] [2:N/A] [N/A:N/A] % Reduction in Volume: [1:Full Thickness Without Exposed] [2:Full Thickness Without Exposed] [N/A:N/A] Classification: [1:Support Structures Medium] [2:Support Structures Medium] [N/A:N/A] Exudate A mount: [1:Serosanguineous] [2:Serosanguineous] [N/A:N/A] Exudate Type: [1:red, brown] [2:red, brown] [N/A:N/A] Exudate Color: [1:Flat and Intact] [2:Flat and Intact] [N/A:N/A] Wound Margin: [1:Medium (34-66%)] [2:Large (67-100%)] [N/A:N/A] Granulation A mount: [1:Pink, Pale] [2:Pink] [N/A:N/A] Granulation Quality: [1:Medium (34-66%)] [2:Small (1-33%)] [N/A:N/A] Necrotic A mount: [1:Fat Layer (Subcutaneous Tissue): Yes Fat Layer (  Subcutaneous Tissue): Yes N/A] Exposed Structures: [1:Fascia: No Tendon: No Muscle: No Joint: No Bone: No None] [2:Fascia: No Tendon: No Muscle: No Joint: No Bone: No None] [N/A:N/A] Epithelialization: [1:Chemical/Enzymatic/Mechanical] [2:Chemical/Enzymatic/Mechanical] [N/A:N/A] Debridement: [1:N/A] [2:N/A] [N/A:N/A] Instrument: [1:None] [2:None] [N/A:N/A] Bleeding: Debridement Treatment Response: Procedure was tolerated well [2:Procedure was tolerated well] [N/A:N/A] Post Debridement Measurements L x 1.2x1x0.2 [2:0.8x0.6x0.1] [N/A:N/A] W x D (cm) [1:0.188] [2:0.038] [N/A:N/A] Post Debridement Volume: (cm) [1:Debridement] [2:Debridement] [N/A:N/A] Treatment Notes Electronic Signature(s) Signed: 08/08/2020 5:01:02 PM By: Linton Ham MD Signed: 08/08/2020 5:39:22 PM By: Deon Pilling Entered By: Linton Ham on 08/08/2020 10:51:22 -------------------------------------------------------------------------------- Multi-Disciplinary Care Plan Details Patient  Name: Date of Service: Crystal Grammes M. 08/08/2020 10:30 A M Medical Record Number: [id number] Patient Account Number: [id number] Date of Birth/Sex: Treating RN: 27-Apr-1946 (75 y.o. [name], [name] Primary Care Vicente Weidler: [name] Other Clinician: Referring Mishaal Lansdale: Treating Torence Palmeri/Extender: Gabriel Rainwater in Treatment: 0 Active Inactive Abuse / Safety / Falls / Self Care Management Nursing Diagnoses: Potential for falls Goals: Patient will not experience any injury related to falls Date Initiated: 08/08/2020 Target Resolution Date: 11/01/2020 Goal Status: Active Patient/caregiver will verbalize understanding of skin care regimen Date Initiated: 08/08/2020 Target Resolution Date: 10/04/2020 Goal Status: Active Interventions: Provide education on fall prevention Notes: Nutrition Nursing Diagnoses: Potential for alteratiion in Nutrition/Potential for imbalanced nutrition Goals: Patient/caregiver agrees to and verbalizes understanding of need to obtain nutritional consultation Date Initiated: 08/08/2020 Target Resolution Date: 10/04/2020 Goal Status: Active Interventions: Provide education on elevated blood sugars and impact on wound healing Provide education on nutrition Treatment Activities: Patient referred to Primary Care Physician for further nutritional evaluation : 08/08/2020 Notes: Orientation to the Wound Care Program Nursing Diagnoses: Knowledge deficit related to the wound healing center program Goals: Patient/caregiver will verbalize understanding of the [place] Date Initiated: 08/08/2020 Target Resolution Date: 08/30/2020 Goal Status: Active Interventions: Provide education on orientation to the wound center Notes: Tissue Oxygenation Nursing Diagnoses: Potential alteration in peripheral tissue perfusion (select prior to confirmation of diagnosis) Goals: Non-invasive arterial studies are  completed as ordered Date Initiated: 08/08/2020 Target Resolution Date: 08/30/2020 Goal Status: Active Interventions: Assess patient understanding of disease process and management upon diagnosis and as needed Assess peripheral arterial status upon admission and as needed Provide education on tissue oxygenation and ischemia Treatment Activities: Non-invasive vascular studies : 08/08/2020 T ordered outside of clinic : 08/08/2020 est Notes: Wound/Skin Impairment Nursing Diagnoses: Knowledge deficit related to ulceration/compromised skin integrity Goals: Patient/caregiver will verbalize understanding of skin care regimen Date Initiated: 08/08/2020 Target Resolution Date: 10/04/2020 Goal Status: Active Ulcer/skin breakdown will heal within 14 weeks Date Initiated: 08/08/2020 Target Resolution Date: 11/29/2020 Goal Status: Active Interventions: Assess patient/caregiver ability to obtain necessary supplies Assess patient/caregiver ability to perform ulcer/skin care regimen upon admission and as needed Provide education on ulcer and skin care Treatment Activities: Skin care regimen initiated : 08/08/2020 Topical wound management initiated : 08/08/2020 Notes: Electronic Signature(s) Signed: 08/08/2020 5:39:22 PM By: Deon Pilling Entered By: Deon Pilling on 08/08/2020 10:27:14 -------------------------------------------------------------------------------- Pain Assessment Details Patient Name: Date of Service: Crystal Moore, Crystal Moore. 08/08/2020 10:30 A M Medical Record Number: [id number] Patient Account Number: [id number] Date of Birth/Sex: Treating RN: 1946/02/23 (75 y.o. Nancy Fetter Primary Care [place]: [name] Other Clinician: Referring Sonjia Wilcoxson: Treating Leandra Vanderweele/Extender: Gabriel Rainwater in Treatment: 0 Active Problems Location of Pain Severity and Description of Pain Patient Has Paino No Site Locations Pain Management and  Medication Current Pain Management: Electronic  Signature(s) Signed: 08/08/2020 5:28:50 PM By: Levan Hurst RN, BSN Entered By: Levan Hurst on 08/08/2020 10:28:45 -------------------------------------------------------------------------------- Patient/Caregiver Education Details Patient Name: Date of Service: Crystal Moore 1/13/2022andnbsp10:30 A M Medical Record Number: [id number] Patient Account Number: [id number] Date of Birth/Gender: Treating RN: 1946/05/15 (76 y.o. Debby Bud Primary Care Physician: [name] Other Clinician: Referring Physician: Treating Physician/Extender: Gabriel Rainwater in Treatment: 0 Education Assessment Education Provided To: Patient and Caregiver grandson Education Topics Provided Tissue Oxygenation: Handouts: Peripheral Arterial Disease and Related Ulcers, Skin Perfusion Tests Methods: Explain/Verbal, Printed Responses: Reinforcements needed [place]: o Handouts: Welcome T [facility] [place] [facility] o Methods: Explain/Verbal, Printed Responses: Reinforcements needed Wound/Skin Impairment: Handouts: Caring for Your Ulcer, Skin Care Do's and Dont's Methods: Explain/Verbal, Printed Responses: Reinforcements needed Electronic Signature(s) Signed: 08/08/2020 5:39:22 PM By: Deon Pilling Entered By: Deon Pilling on 08/08/2020 10:28:04 -------------------------------------------------------------------------------- Wound Assessment Details Patient Name: Date of Service: Crystal Moore. 08/08/2020 10:30 A M Medical Record Number: [id number] Patient Account Number: [id number] Date of Birth/Sex: Treating RN: 14-Jun-1946 (75 y.o. Nancy Fetter Primary Care Seddrick Flax: [name] Other Clinician: Referring Koni Kannan: Treating Delma Drone/Extender: Gabriel Rainwater in Treatment: 0 Wound Status Wound Number: 1 Primary Arterial  Insufficiency Ulcer Etiology: Wound Location: Left, Lateral Malleolus Wound Open Wounding Event: Pressure Injury Status: Date Acquired: 03/27/2020 Comorbid Chronic Obstructive Pulmonary Disease (COPD), Hypertension, Weeks Of Treatment: 0 History: Type II Diabetes, Osteoarthritis, Seizure Disorder Clustered Wound: No Wound Measurements Length: (cm) 1.2 Width: (cm) 1 Depth: (cm) 0.2 Area: (cm) 0.942 Volume: (cm) 0.188 % Reduction in Area: 0% % Reduction in Volume: 0% Epithelialization: None Tunneling: No Undermining: No Wound Description Classification: Full Thickness Without Exposed Support Structures Wound Margin: Flat and Intact Exudate Amount: Medium Exudate Type: Serosanguineous Exudate Color: red, brown Foul Odor After Cleansing: No Slough/Fibrino Yes Wound Bed Granulation Amount: Medium (34-66%) Exposed Structure Granulation Quality: Pink, Pale Fascia Exposed: No Necrotic Amount: Medium (34-66%) Fat Layer (Subcutaneous Tissue) Exposed: Yes Necrotic Quality: Adherent Slough Tendon Exposed: No Muscle Exposed: No Joint Exposed: No Bone Exposed: No Electronic Signature(s) Signed: 08/08/2020 5:28:50 PM By: Levan Hurst RN, BSN Signed: 08/08/2020 5:39:22 PM By: Deon Pilling Entered By: Deon Pilling on 08/08/2020 10:40:02 -------------------------------------------------------------------------------- Wound Assessment Details Patient Name: Date of Service: Crystal Moore. 08/08/2020 10:30 A M Medical Record Number: [id number] Patient Account Number: [id number] Date of Birth/Sex: Treating RN: [birth year] (75 y.o. Nancy Fetter Primary Care Courtnay Petrilla: [name] Other Clinician: Referring Lamoine Magallon: Treating Mozell Haber/Extender: Gabriel Rainwater in Treatment: 0 Wound Status Wound Number: 2 Primary Trauma, Other Etiology: Wound Location: Right, Anterior Lower Leg Wound Open Wounding Event: Trauma Status: Date  Acquired: 06/26/2020 Comorbid Chronic Obstructive Pulmonary Disease (COPD), Hypertension, Weeks Of Treatment: 0 Weeks Of Treatment: 0 History: Type II Diabetes, Osteoarthritis, Seizure Disorder Clustered Wound: No Wound Measurements Length: (cm) 0.8 Width: (cm) 0.6 Depth: (cm) 0.1 Area: (cm) 0.377 Volume: (cm) 0.038 % Reduction in Area: % Reduction in Volume: Epithelialization: None Tunneling: No Undermining: No Wound Description Classification: Full Thickness Without Exposed Support Structu Wound Margin: Flat and Intact Exudate Amount: Medium Exudate Type: Serosanguineous Exudate Color: red, brown res Foul Odor After Cleansing: No Slough/Fibrino Yes Wound Bed Granulation Amount: Large (67-100%) Exposed Structure Granulation Quality: Pink Fascia Exposed: No Necrotic Amount: Small (1-33%) Fat Layer (Subcutaneous Tissue) Exposed: Yes Necrotic Quality: Adherent Slough Tendon Exposed: No Muscle Exposed: No Joint Exposed: No Bone Exposed: No Electronic Signature(s) Signed: 08/08/2020  5:28:50 PM By: Levan Hurst RN, BSN Entered By: Levan Hurst on 08/08/2020 10:28:04 -------------------------------------------------------------------------------- [place] Details Patient Name: Date of Service: Crystal Grammes M. 08/08/2020 10:30 A M Medical Record Number: [id number] Patient Account Number: [id number] Date of Birth/Sex: Treating RN: 1945/11/29 (75 y.o. Nancy Fetter Primary Care Elijah Phommachanh: [name] Other Clinician: Referring Alexyss Balzarini: Treating Norlene Lanes/Extender: Gabriel Rainwater in Treatment: 0 Vital Signs Time Taken: 10:02 Temperature (F): 97.5 Pulse (bpm): 112 Respiratory Rate (breaths/min): 16 Blood Pressure (mmHg): 161/69 Reference Range: 80 - 120 mg / dl Electronic Signature(s) Signed: 08/08/2020 5:28:50 PM By: Levan Hurst RN, BSN Entered By: Levan Hurst on 08/08/2020 10:03:20

## 2020-08-08 NOTE — Progress Notes (Signed)
Crystal Moore, Crystal Moore ([id number]) Visit Report for 08/08/2020 Abuse/Suicide Risk Screen Details Patient Name: Date of Service: Crystal Moore. 08/08/2020 10:30 A M Medical Record Number: [id number] Patient Account Number: [id number] Date of Birth/Sex: Treating RN: 1946-03-18 (76 y.o. Nancy Fetter Primary Care Cady Hafen: [name] Other Clinician: Referring Carmalita Wakefield: Treating Haydon Dorris/Extender: Gabriel Rainwater in Treatment: 0 Abuse/Suicide Risk Screen Items Answer ABUSE RISK SCREEN: Has anyone close to you tried to hurt or harm you recentlyo No Do you feel uncomfortable with anyone in your familyo No Has anyone forced you do things that you didnt want to doo No Electronic Signature(s) Signed: 08/08/2020 5:28:50 PM By: Levan Hurst RN, BSN Entered By: Levan Hurst on 08/08/2020 10:16:16 -------------------------------------------------------------------------------- Activities of Daily Living Details Patient Name: Date of Service: Crystal Moore. 08/08/2020 10:30 A M Medical Record Number: [id number] Patient Account Number: [id number] Date of Birth/Sex: Treating RN: [birth year] (75 y.o. Nancy Fetter Primary Care [place] Weekly: [name] Other Clinician: Referring Helmuth Recupero: Treating Zlaty Alexa/Extender: Gabriel Rainwater in Treatment: 0 Activities of Daily Living Items Answer Activities of Daily Living (Please select one for each item) Drive Automobile Not Able T Medications ake Need Assistance Use T elephone Need Assistance Care for Appearance Need Assistance Use T oilet Need Assistance Bath / Shower Need Assistance Dress Self Need Assistance Feed Self Completely Able Walk Need Assistance Get In / Out Bed Need Assistance Housework Not Able Prepare Meals Not [place] for Self Need Assistance Electronic Signature(s) Signed: 08/08/2020 5:28:50 PM By: Levan Hurst RN,  BSN Entered By: Levan Hurst on 08/08/2020 10:16:46 -------------------------------------------------------------------------------- Education Screening Details Patient Name: Date of Service: Crystal Moore. 08/08/2020 10:30 A M Medical Record Number: [id number] Patient Account Number: [id number] Date of Birth/Sex: Treating RN: 17-Sep-1945 (75 y.o. Nancy Fetter Primary Care Jimena Wieczorek: [name] Other Clinician: Referring Krystyne Tewksbury: Treating Abigaile Rossie/Extender: Gabriel Rainwater in Treatment: 0 Primary Learner Assessed: Patient Learning Preferences/Education Level/Primary Language Learning Preference: Explanation, Demonstration, Printed Material Highest Education Level: High School Preferred Language: English Cognitive Barrier Language Barrier: No Translator Needed: No Memory Deficit: No Emotional Barrier: No Cultural/Religious Beliefs Affecting Medical Care: No Physical Barrier Impaired Vision: No Impaired Hearing: No Decreased Hand dexterity: No Knowledge/Comprehension Knowledge Level: High Comprehension Level: High Ability to understand written instructions: High Ability to understand verbal instructions: High Motivation Anxiety Level: Calm Cooperation: Cooperative Education Importance: Acknowledges Need Interest in Health Problems: Asks Questions Perception: Coherent Willingness to Engage in Self-Management High Activities: Readiness to Engage in Self-Management High Activities: Electronic Signature(s) Signed: 08/08/2020 5:28:50 PM By: Levan Hurst RN, BSN Entered By: Levan Hurst on 08/08/2020 10:17:07 -------------------------------------------------------------------------------- Fall Risk Assessment Details Patient Name: Date of Service: Crystal Grammes M. 08/08/2020 10:30 A M Medical Record Number: [id number] Patient Account Number: [id number] Date of Birth/Sex: Treating RN: [birth year] (75 y.o. Nancy Fetter Primary Care Cadyn Rodger: [name] Other Clinician: Referring Macaulay Reicher: Treating Katriona Schmierer/Extender: Gabriel Rainwater in Treatment: 0 Fall Risk Assessment Items Have you had 2 or more falls in the last 12 monthso 0 Yes Have you had any fall that resulted in injury in the last 12 monthso 0 No FALLS RISK SCREEN History of falling - immediate or within 3 months 0 No Secondary diagnosis (Do you have 2 or more medical diagnoseso) 15 Yes Ambulatory aid None/bed rest/wheelchair/nurse 0 No Crutches/cane/walker 15 Yes Furniture 0 No Intravenous therapy Access/Saline/Heparin Lock 0 No Gait/Transferring Normal/ bed rest/ wheelchair 0 No  Weak (short steps with or without shuffle, stooped but able to lift head while walking, may seek 0 No support from furniture) Impaired (short steps with shuffle, may have difficulty arising from chair, head down, impaired 20 Yes balance) Mental Status Oriented to own ability 0 No Electronic Signature(s) Signed: 08/08/2020 5:28:50 PM By: Levan Hurst RN, BSN Entered By: Levan Hurst on 08/08/2020 10:17:28 -------------------------------------------------------------------------------- Foot Assessment Details Patient Name: Date of Service: Crystal Grammes M. 08/08/2020 10:30 A M Medical Record Number: [id number] Patient Account Number: [id number] Date of Birth/Sex: Treating RN: January 29, 1946 (75 y.o. Nancy Fetter Primary Care Wafaa Deemer: [name] Other Clinician: Referring Raden Byington: Treating Dionna Wiedemann/Extender: Gabriel Rainwater in Treatment: 0 Foot Assessment Items [x]  Unable to perform due to altered mental status Site Locations + = Sensation present, - = Sensation absent, C = Callus, U = Ulcer R = Redness, W = Warmth, M = Maceration, PU = Pre-ulcerative lesion F = Fissure, S = Swelling, D = Dryness Assessment Right: Left: Other Deformity: No No Prior Foot  Ulcer: No No Prior Amputation: No No Charcot Joint: No No Ambulatory Status: Ambulatory With Help Assistance Device: Wheelchair Gait: Administrator, arts) Signed: 08/08/2020 5:28:50 PM By: Levan Hurst RN, BSN Entered By: Levan Hurst on 08/08/2020 10:17:48 -------------------------------------------------------------------------------- Nutrition Risk Screening Details Patient Name: Date of Service: Crystal Moore. 08/08/2020 10:30 A M Medical Record Number: [id number] Patient Account Number: [id number] Date of Birth/Sex: Treating RN: 24-Aug-1945 (75 y.o. Nancy Fetter Primary Care Neiko Trivedi: [name] Other Clinician: Referring Moni Rothrock: Treating Adian Jablonowski/Extender: Gabriel Rainwater in Treatment: 0 Height (in): Weight (lbs): Body Mass Index (BMI): Nutrition Risk Screening Items Score Screening NUTRITION RISK SCREEN: I have an illness or condition that made me change the kind and/or amount of food I eat 2 Yes I eat fewer than two meals per day 0 No I eat few fruits and vegetables, or milk products 0 No I have three or more drinks of beer, liquor or wine almost every day 0 No I have tooth or mouth problems that make it hard for me to eat 0 No I don't always have enough money to buy the food I need 0 No I eat alone most of the time 0 No I take three or more different prescribed or over-the-counter drugs a day 1 Yes Without wanting to, I have lost or gained 10 pounds in the last six months 0 No I am not always physically able to shop, cook and/or feed myself 2 Yes Nutrition Protocols Good Risk Protocol Moderate Risk Protocol 0 Provide education on nutrition High Risk Proctocol Risk Level: Moderate Risk Score: 5 Electronic Signature(s) Signed: 08/08/2020 5:28:50 PM By: Levan Hurst RN, BSN Entered By: Levan Hurst on 08/08/2020 10:17:36

## 2020-08-09 ENCOUNTER — Emergency Department (HOSPITAL_COMMUNITY)
Admission: EM | Admit: 2020-08-09 | Discharge: 2020-08-09 | Disposition: A | Discharge status: Home / Self Care | Payer: Medicare Other | Attending: Emergency Medicine | Admitting: Emergency Medicine

## 2020-08-09 ENCOUNTER — Other Ambulatory Visit: Payer: Self-pay | Admitting: Internal Medicine

## 2020-08-09 ENCOUNTER — Encounter (HOSPITAL_COMMUNITY): Payer: Self-pay

## 2020-08-09 ENCOUNTER — Emergency Department (HOSPITAL_COMMUNITY): Payer: Medicare Other

## 2020-08-09 ENCOUNTER — Other Ambulatory Visit: Payer: Self-pay

## 2020-08-09 DIAGNOSIS — M7989 Other specified soft tissue disorders: Secondary | ICD-10-CM | POA: Diagnosis not present

## 2020-08-09 DIAGNOSIS — X58XXXA Exposure to other specified factors, initial encounter: Secondary | ICD-10-CM | POA: Insufficient documentation

## 2020-08-09 DIAGNOSIS — S91032A Puncture wound without foreign body, left ankle, initial encounter: Secondary | ICD-10-CM | POA: Diagnosis not present

## 2020-08-09 DIAGNOSIS — Z8616 Personal history of COVID-19: Secondary | ICD-10-CM | POA: Diagnosis not present

## 2020-08-09 DIAGNOSIS — M86172 Other acute osteomyelitis, left ankle and foot: Secondary | ICD-10-CM | POA: Diagnosis not present

## 2020-08-09 DIAGNOSIS — R4182 Altered mental status, unspecified: Secondary | ICD-10-CM | POA: Diagnosis present

## 2020-08-09 DIAGNOSIS — R5600 Simple febrile convulsions: Secondary | ICD-10-CM | POA: Insufficient documentation

## 2020-08-09 DIAGNOSIS — M869 Osteomyelitis, unspecified: Secondary | ICD-10-CM

## 2020-08-09 DIAGNOSIS — Z5321 Procedure and treatment not carried out due to patient leaving prior to being seen by health care provider: Secondary | ICD-10-CM | POA: Diagnosis not present

## 2020-08-09 DIAGNOSIS — M868X6 Other osteomyelitis, lower leg: Secondary | ICD-10-CM | POA: Diagnosis not present

## 2020-08-09 DIAGNOSIS — I739 Peripheral vascular disease, unspecified: Secondary | ICD-10-CM

## 2020-08-09 LAB — CBC WITH DIFFERENTIAL/PLATELET
Abs Immature Granulocytes: 0.09 10*3/uL — ABNORMAL HIGH (ref 0.00–0.07)
Basophils Absolute: 0.1 10*3/uL (ref 0.0–0.1)
Basophils Relative: 0 %
Eosinophils Absolute: 0 10*3/uL (ref 0.0–0.5)
Eosinophils Relative: 0 %
HCT: 34 % — ABNORMAL LOW (ref 36.0–46.0)
Hemoglobin: 10.6 g/dL — ABNORMAL LOW (ref 12.0–15.0)
Immature Granulocytes: 1 %
Lymphocytes Relative: 11 %
Lymphs Abs: 1.8 10*3/uL (ref 0.7–4.0)
MCH: 24.5 pg — ABNORMAL LOW (ref 26.0–34.0)
MCHC: 31.2 g/dL (ref 30.0–36.0)
MCV: 78.5 fL — ABNORMAL LOW (ref 80.0–100.0)
Monocytes Absolute: 0.7 10*3/uL (ref 0.1–1.0)
Monocytes Relative: 4 %
Neutro Abs: 14 10*3/uL — ABNORMAL HIGH (ref 1.7–7.7)
Neutrophils Relative %: 84 %
Platelets: 401 10*3/uL — ABNORMAL HIGH (ref 150–400)
RBC: 4.33 MIL/uL (ref 3.87–5.11)
RDW: 15.7 % — ABNORMAL HIGH (ref 11.5–15.5)
WBC: 16.6 10*3/uL — ABNORMAL HIGH (ref 4.0–10.5)
nRBC: 0 % (ref 0.0–0.2)

## 2020-08-09 LAB — COMPREHENSIVE METABOLIC PANEL
ALT: 13 U/L (ref 0–44)
AST: 14 U/L — ABNORMAL LOW (ref 15–41)
Albumin: 3.2 g/dL — ABNORMAL LOW (ref 3.5–5.0)
Alkaline Phosphatase: 108 U/L (ref 38–126)
Anion gap: 12 (ref 5–15)
BUN: 21 mg/dL (ref 8–23)
CO2: 24 mmol/L (ref 22–32)
Calcium: 9.3 mg/dL (ref 8.9–10.3)
Chloride: 98 mmol/L (ref 98–111)
Creatinine, Ser: 1.16 mg/dL — ABNORMAL HIGH (ref 0.44–1.00)
GFR, Estimated: 50 mL/min — ABNORMAL LOW (ref >60)
Glucose, Bld: 347 mg/dL — ABNORMAL HIGH (ref 70–99)
Potassium: 4.4 mmol/L (ref 3.5–5.1)
Sodium: 134 mmol/L — ABNORMAL LOW (ref 135–145)
Total Bilirubin: 0.5 mg/dL (ref 0.3–1.2)
Total Protein: 7.3 g/dL (ref 6.5–8.1)

## 2020-08-09 LAB — PROTIME-INR
INR: 1 (ref 0.8–1.2)
Prothrombin Time: 13.2 seconds (ref 11.4–15.2)

## 2020-08-09 LAB — LACTIC ACID, PLASMA: Lactic Acid, Venous: 2.4 mmol/L (ref 0.5–1.9)

## 2020-08-09 NOTE — ED Triage Notes (Signed)
Pt accompanied by daughter who reports AMS today per caregiver at her house. Pt has hx of multiple strokes with left sided deficits along with dementia. Pt alert, oriented to self only. Febrile in triage, wound noted to left ankle, had wound care appointment yesterday. Wound is draining yellow pus. Pt also tested positive for COVID 2 weeks ago

## 2020-08-09 NOTE — ED Notes (Signed)
Pt left the ED and wrist band was cut off.

## 2020-08-10 DIAGNOSIS — L97328 Non-pressure chronic ulcer of left ankle with other specified severity: Secondary | ICD-10-CM | POA: Diagnosis not present

## 2020-08-10 DIAGNOSIS — E1151 Type 2 diabetes mellitus with diabetic peripheral angiopathy without gangrene: Secondary | ICD-10-CM | POA: Diagnosis not present

## 2020-08-10 DIAGNOSIS — I739 Peripheral vascular disease, unspecified: Secondary | ICD-10-CM | POA: Diagnosis not present

## 2020-08-10 DIAGNOSIS — R2681 Unsteadiness on feet: Secondary | ICD-10-CM | POA: Diagnosis not present

## 2020-08-10 DIAGNOSIS — R531 Weakness: Secondary | ICD-10-CM | POA: Diagnosis not present

## 2020-08-10 DIAGNOSIS — L97511 Non-pressure chronic ulcer of other part of right foot limited to breakdown of skin: Secondary | ICD-10-CM | POA: Diagnosis not present

## 2020-08-10 DIAGNOSIS — E11622 Type 2 diabetes mellitus with other skin ulcer: Secondary | ICD-10-CM | POA: Diagnosis not present

## 2020-08-12 DIAGNOSIS — I739 Peripheral vascular disease, unspecified: Secondary | ICD-10-CM | POA: Diagnosis not present

## 2020-08-12 DIAGNOSIS — E11622 Type 2 diabetes mellitus with other skin ulcer: Secondary | ICD-10-CM | POA: Diagnosis not present

## 2020-08-13 ENCOUNTER — Ambulatory Visit: Payer: Medicare Other | Admitting: Endocrinology

## 2020-08-13 DIAGNOSIS — R531 Weakness: Secondary | ICD-10-CM | POA: Diagnosis not present

## 2020-08-13 DIAGNOSIS — I739 Peripheral vascular disease, unspecified: Secondary | ICD-10-CM | POA: Diagnosis not present

## 2020-08-13 DIAGNOSIS — L97328 Non-pressure chronic ulcer of left ankle with other specified severity: Secondary | ICD-10-CM | POA: Diagnosis not present

## 2020-08-13 DIAGNOSIS — L97511 Non-pressure chronic ulcer of other part of right foot limited to breakdown of skin: Secondary | ICD-10-CM | POA: Diagnosis not present

## 2020-08-13 DIAGNOSIS — E1151 Type 2 diabetes mellitus with diabetic peripheral angiopathy without gangrene: Secondary | ICD-10-CM | POA: Diagnosis not present

## 2020-08-13 DIAGNOSIS — E11622 Type 2 diabetes mellitus with other skin ulcer: Secondary | ICD-10-CM | POA: Diagnosis not present

## 2020-08-13 DIAGNOSIS — R2681 Unsteadiness on feet: Secondary | ICD-10-CM | POA: Diagnosis not present

## 2020-08-14 ENCOUNTER — Ambulatory Visit: Payer: Medicare Other | Admitting: Endocrinology

## 2020-08-14 DIAGNOSIS — L97511 Non-pressure chronic ulcer of other part of right foot limited to breakdown of skin: Secondary | ICD-10-CM | POA: Diagnosis not present

## 2020-08-14 DIAGNOSIS — L97328 Non-pressure chronic ulcer of left ankle with other specified severity: Secondary | ICD-10-CM | POA: Diagnosis not present

## 2020-08-14 DIAGNOSIS — R531 Weakness: Secondary | ICD-10-CM | POA: Diagnosis not present

## 2020-08-14 DIAGNOSIS — E1151 Type 2 diabetes mellitus with diabetic peripheral angiopathy without gangrene: Secondary | ICD-10-CM | POA: Diagnosis not present

## 2020-08-14 DIAGNOSIS — I739 Peripheral vascular disease, unspecified: Secondary | ICD-10-CM | POA: Diagnosis not present

## 2020-08-14 DIAGNOSIS — R2681 Unsteadiness on feet: Secondary | ICD-10-CM | POA: Diagnosis not present

## 2020-08-14 DIAGNOSIS — E11622 Type 2 diabetes mellitus with other skin ulcer: Secondary | ICD-10-CM | POA: Diagnosis not present

## 2020-08-15 ENCOUNTER — Other Ambulatory Visit: Payer: Self-pay

## 2020-08-15 ENCOUNTER — Encounter (HOSPITAL_BASED_OUTPATIENT_CLINIC_OR_DEPARTMENT_OTHER): Payer: Medicare Other | Admitting: Internal Medicine

## 2020-08-15 DIAGNOSIS — L97811 Non-pressure chronic ulcer of other part of right lower leg limited to breakdown of skin: Secondary | ICD-10-CM | POA: Diagnosis not present

## 2020-08-15 DIAGNOSIS — E11622 Type 2 diabetes mellitus with other skin ulcer: Secondary | ICD-10-CM | POA: Diagnosis not present

## 2020-08-15 DIAGNOSIS — L97328 Non-pressure chronic ulcer of left ankle with other specified severity: Secondary | ICD-10-CM | POA: Diagnosis not present

## 2020-08-15 DIAGNOSIS — L97322 Non-pressure chronic ulcer of left ankle with fat layer exposed: Secondary | ICD-10-CM | POA: Diagnosis not present

## 2020-08-15 DIAGNOSIS — Z87891 Personal history of nicotine dependence: Secondary | ICD-10-CM | POA: Diagnosis not present

## 2020-08-15 DIAGNOSIS — L97511 Non-pressure chronic ulcer of other part of right foot limited to breakdown of skin: Secondary | ICD-10-CM | POA: Diagnosis not present

## 2020-08-15 DIAGNOSIS — E11621 Type 2 diabetes mellitus with foot ulcer: Secondary | ICD-10-CM | POA: Diagnosis not present

## 2020-08-15 DIAGNOSIS — E1151 Type 2 diabetes mellitus with diabetic peripheral angiopathy without gangrene: Secondary | ICD-10-CM | POA: Diagnosis not present

## 2020-08-15 NOTE — Progress Notes (Signed)
Crystal Moore, Crystal Moore ([id number]) Visit Report for 08/15/2020 HPI Details Patient Name: Date of Service: Crystal Moore, Crystal Moore. 08/15/2020 10:00 A M Medical Record Number: [id number] Patient Account Number: [id number] Date of Birth/Sex: Treating RN: 1946-02-16 (75 y.o. Crystal Moore Primary Care Provider: Leeroy Moore Other Clinician: Referring Provider: Treating Provider/Extender: Crystal Moore in Treatment: 1 History of Present Illness HPI Description: ADMISSION 08/08/2020 This is a 75 year old woman who is disabled largely because of a history of stroke and vascular dementia. She is a type II diabetic on insulin and although she follows with endocrinology we could not see her recent hemoglobin A1c. She has an area on her left lateral malleolus that has been present since September. Not exactly sure how this started however certainly not it is not progressed towards healing. She has a more recent area on the right upper mid tibia. This does not look as bad and may have been secondary to minor trauma although the history here is not really exact. The patient is minimally ambulatory. Recently moved here from [facility] about a year ago just settling in. She lives with her daughter however daughter works and her grandson brought her into the clinic today. They have been using AandE ointment on the wounds. There have been some suggestion about Neosporin until she got here but they have continued with the AandE ointment Past medical history includes; vascular dementia, history of CVA leaving her mostly aphasic, type 2 diabetes on insulin, COPD, seizure disorder. She stopped smoking about 2 years ago. ABIs in our clinic were 0.5 on the right and 0.37 on the left 1/20; major wound is over the left lateral malleolus I think this is an ischemic wound. We have had trouble making connections with the family with regards to the messages although they do have home  health going in but we have not yet been able to arrange her arterial test. Small wound on the right anterior upper tibia area looks actually somewhat better this week. Electronic Signature(s) Signed: 08/15/2020 4:36:20 PM By: Crystal Ham MD Entered By: Crystal Moore on 08/15/2020 11:43:12 -------------------------------------------------------------------------------- Physical Exam Details Patient Name: Date of Service: Crystal Moore. 08/15/2020 10:00 A M Medical Record Number: [id number] Patient Account Number: [id number] Date of Birth/Sex: Treating RN: [birth year] (75 y.o. Crystal Moore Primary Care Provider: Leeroy Moore Other Clinician: Referring Provider: Treating Provider/Extender: Crystal Moore in Treatment: 1 Constitutional Patient is hypertensive.. Pulse regular and within target range for patient.Marland Kitchen Respirations regular, non-labored and within target range.. Temperature is normal and within the target range for the patient.Marland Kitchen Appears in no distress. Cardiovascular Popliteal pulses are also absent bilaterally. She does have palpable femoral pulses.. Pedal pulses absent bilaterally.. Notes Wound exam; small superficial wound on the right anterior tibia looks as though it is closing over. The major area here is on the left lateral malleolus. Necrotic cover to this. No debridement because of the degree of likely PAD. Electronic Signature(s) Signed: 08/15/2020 4:36:20 PM By: Crystal Ham MD Entered By: Crystal Moore on 08/15/2020 11:44:44 -------------------------------------------------------------------------------- Physician Orders Details Patient Name: Date of Service: Crystal Moore. 08/15/2020 10:00 A M Medical Record Number: [id number] Patient Account Number: [id number] Date of Birth/Sex: Treating RN: Oct 04, 1945 (75 y.o. Crystal Moore Primary Care Provider: Leeroy Moore Other Clinician: Referring  Provider: Treating Provider/Extender: Crystal Moore in Treatment: 1 Verbal / Phone Orders: No Diagnosis Coding ICD-10 Coding Code Description E11.622 Type 2 diabetes mellitus with other skin  ulcer I73.9 Peripheral vascular disease, unspecified E11.51 Type 2 diabetes mellitus with diabetic peripheral angiopathy without gangrene L97.328 Non-pressure chronic ulcer of left ankle with other specified severity L97.511 Non-pressure chronic ulcer of other part of right foot limited to breakdown of skin Follow-up Appointments Return Appointment in 1 week. Bathing/ Shower/ Hygiene May shower with protection but do not get wound dressing(s) wet. - with not dressing changes only. May shower and wash wound with soap and water. - may wash with soap and water with dressing change. Edema Control - Lymphedema / SCD / Other Elevate legs to the level of the heart or above for 30 minutes daily and/or when sitting, a frequency of: - throughout the day. Avoid standing for long periods of time. Off-Loading Other: - ensure no pressure to left ankle wound. Additional Orders / Instructions Other: - Follow up with [facility] related to Arterial Studies. Phone [phone]. Home Health No change in wound care orders this week; continue Home Health for wound care. May utilize formulary equivalent dressing for wound treatment orders unless otherwise specified. - [facility] dressing changes two times a week. Wound Treatment Wound #1 - Malleolus Wound Laterality: Left, Lateral Cleanser: Soap and Water Every Other Day/30 Days Discharge Instructions: May shower and wash wound with dial antibacterial soap and water prior to dressing change. Peri-Wound Care: Skin Prep (Dispense As Written) Every Other Day/30 Days Discharge Instructions: Use skin prep as directed Prim Dressing: Iodosorb Gel 10 (gm) Tube (Generic) Every Other Day/30 Days ary Discharge Instructions: or  iodoflex.Apply to wound bed as instructed Secondary Dressing: Bordered Gauze, 4x4 in (Generic) Every Other Day/30 Days Discharge Instructions: or bordered foam. Apply over primary dressing as directed. Wound #2 - Lower Leg Wound Laterality: Right, Anterior Cleanser: Soap and Water Every Other Day/30 Days Discharge Instructions: May shower and wash wound with dial antibacterial soap and water prior to dressing change. Peri-Wound Care: Skin Prep Every Other Day/30 Days Discharge Instructions: Use skin prep as directed Prim Dressing: Maxorb Extra Calcium Alginate 2x2 in (Generic) Every Other Day/30 Days ary Discharge Instructions: Apply calcium alginate to wound bed as instructed Secondary Dressing: Bordered Gauze, 2x3.75 in (Generic) Every Other Day/30 Days Discharge Instructions: or bordered foam. Apply over primary dressing as directed. Electronic Signature(s) Signed: 08/15/2020 4:36:20 PM By: Crystal Ham MD Signed: 08/15/2020 4:56:08 PM By: Deon Pilling Entered By: Deon Pilling on 08/15/2020 10:59:36 -------------------------------------------------------------------------------- Problem List Details Patient Name: Date of Service: Crystal Moore. 08/15/2020 10:00 A M Medical Record Number: [id number] Patient Account Number: [id number] Date of Birth/Sex: Treating RN: 1946-07-10 (75 y.o. 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 08/15/2020 10:00 A M Medical Record Number: [id number] Patient Account Number: [id number] Date of Birth/Sex: Treating RN: 31-May-1946 (75 y.o. [name], [name] Primary  Care Provider: Other Clinician: Leeroy Moore Referring Provider: Treating Provider/Extender: Wynelle Bourgeois, Jake Samples in Treatment: 1 Subjective History of Present Illness (HPI) ADMISSION 08/08/2020 This is a 75 year old woman who is disabled largely because of a history of stroke and vascular dementia. She is a type II diabetic on insulin and although she follows with endocrinology we could not see her recent hemoglobin A1c. She has an area on her left lateral malleolus that has been present since September. Not exactly sure how this started however certainly not it is not progressed towards healing. She has a more recent area on the right upper mid tibia. This does not look as bad and may have been secondary to minor trauma although the history here is not really exact. The patient is minimally ambulatory. Recently moved here from [facility] about a year ago just settling in. She lives with her daughter however daughter works and her grandson brought her into the clinic today. They have been using AandE ointment on the wounds. There have been some suggestion about Neosporin until she got here but they have continued with the AandE ointment Past medical history includes; vascular dementia, history of CVA leaving her mostly aphasic, type 2 diabetes on insulin, COPD,  seizure disorder. She stopped smoking about 2 years ago. ABIs in our clinic were 0.5 on the right and 0.37 on the left 1/20; major wound is over the left lateral malleolus I think this is an ischemic wound. We have had trouble making connections with the family with regards to the messages although they do have home health going in but we have not yet been able to arrange her arterial test. Small wound on the right anterior upper tibia area looks actually somewhat better this week. Objective Constitutional Patient is hypertensive.. Pulse regular and within target range for patient.Marland Kitchen Respirations regular, non-labored and within target range.. Temperature is normal and within the target range for the patient.Marland Kitchen Appears in no distress. Vitals Time Taken: 10:24 AM, Temperature: 98.5 F, Pulse: 90 bpm, Respiratory Rate: 16 breaths/min, Blood Pressure: 189/74 mmHg. Cardiovascular Popliteal pulses are also absent bilaterally. She does have palpable femoral pulses.. Pedal pulses absent bilaterally.. General Notes: Wound exam; small superficial wound on the right anterior tibia looks as though it is closing over. oo The major area here is on the left lateral malleolus. Necrotic cover to this. No debridement because of the degree of likely PAD. Integumentary (Hair, Skin) Wound #1 status is Open. Original cause of wound was Pressure Injury. The wound is located on the Left,Lateral Malleolus. The wound measures 1.8cm length x 1.3cm width x 0.1cm depth; 1.838cm^2 area and 0.184cm^3 volume. There is Fat Layer (Subcutaneous Tissue) exposed. There is no tunneling or undermining noted. There is a medium amount of serosanguineous drainage noted. The wound margin is thickened. There is medium (34-66%) pink, pale granulation within the wound bed. There is a medium (34-66%) amount of necrotic tissue within the wound bed including Adherent Slough. Wound #2 status is Open. Original cause of wound was Trauma. The wound  is located on the Right,Anterior Lower Leg. The wound measures 0.2cm length x 0.2cm width x 0.1cm depth; 0.031cm^2 area and 0.003cm^3 volume. The wound is limited to skin breakdown. There is no tunneling or undermining noted. There is a none present amount of drainage noted. The wound margin is flat and intact. There is small (1-33%) pink granulation within the wound bed. There is no necrotic tissue within the wound bed. 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 Bathing/ Shower/ Hygiene: May shower with protection but do not get wound dressing(s) wet. - with not dressing changes only. May shower and wash wound with soap and water. - may wash with soap and water with dressing change. Edema Control - Lymphedema / SCD / Other: Elevate legs to the level of the heart or above for 30 minutes daily and/or when sitting, a frequency of: - throughout the day. Avoid standing for long periods of time. Off-Loading: Other: - ensure no pressure to left ankle wound. Additional Orders / Instructions: Other: - Follow up with [facility] related to Arterial Studies. Phone [phone]. Home Health: No change in wound care orders this week; continue Home Health for wound care. May utilize formulary equivalent dressing for wound treatment orders unless otherwise specified. - [facility] dressing changes two times a week. WOUND #1: - Malleolus Wound Laterality: Left, Lateral Cleanser: Soap and Water Every Other Day/30 Days Discharge Instructions: May shower and wash wound with dial antibacterial soap and water prior to dressing  change. Peri-Wound Care: Skin Prep (Dispense As Written) Every Other Day/30 Days Discharge Instructions: Use skin prep as directed Prim Dressing: Iodosorb Gel 10 (gm) Tube (Generic) Every Other Day/30 Days ary Discharge Instructions: or iodoflex.Apply to wound bed as instructed Secondary Dressing: Bordered Gauze, 4x4 in (Generic) Every Other Day/30 Days Discharge Instructions: or bordered foam. Apply over primary dressing as directed. WOUND #2: - Lower Leg Wound Laterality: Right, Anterior Cleanser: Soap and Water Every Other Day/30 Days Discharge Instructions: May shower and wash wound with dial antibacterial soap and water prior to dressing change. Peri-Wound Care: Skin Prep Every Other Day/30 Days Discharge Instructions: Use skin prep as directed Prim Dressing: Maxorb Extra Calcium Alginate 2x2 in (Generic) Every Other Day/30 Days ary Discharge Instructions: Apply calcium alginate to wound bed as instructed Secondary Dressing: Bordered Gauze, 2x3.75 in (Generic) Every Other Day/30 Days Discharge Instructions: or bordered foam. Apply over primary dressing as directed. 1. We are using Iodoflex to the left ankle area 2. Silver alginate to the right anterior 3. The patient needs arterial consultation/noninvasive studies. If these are going to be as problematic as I think then we will need to talk with them about the risk benefits of some form of angiogram. I have little doubt that the area on the left lateral ankle is ischemic Electronic Signature(s) Signed: 08/15/2020 4:36:20 PM By: Crystal Ham MD Entered By: Crystal Moore on 08/15/2020 11:46:09 -------------------------------------------------------------------------------- SuperBill Details Patient Name: Date of Service: Crystal Moore. 08/15/2020 Medical Record Number: [id number] Patient Account Number: [id number] Date of Birth/Sex: Treating RN: [birth year] (75 y.o. [name], [name] Primary Care Provider: Leeroy Moore  Other Clinician: Referring Provider: Treating Provider/Extender: Crystal Moore in Treatment: 1 Diagnosis Coding ICD-10 Codes Code Description E11.622 Type 2 diabetes mellitus with other skin ulcer I73.9 Peripheral vascular disease, unspecified E11.51 Type 2 diabetes mellitus with diabetic peripheral angiopathy without gangrene L97.328 Non-pressure chronic ulcer of left ankle with other specified severity L97.511 Non-pressure chronic ulcer of other part of right foot limited to breakdown of skin Facility Procedures CPT4 Code: [id number] Description: 99214 - WOUND CARE VISIT-LEV 4 EST PT Modifier: Quantity: 1 Physician Procedures : UKG2  Code Description Modifier [id number] - WC PHYS LEVEL 3 - EST PT ICD-10 Diagnosis Description L97.328 Non-pressure chronic ulcer of left ankle with other specified severity E11.622 Type 2 diabetes mellitus with other skin ulcer I73.9 Peripheral  vascular disease, unspecified Quantity: 1 Electronic Signature(s) Signed: 08/15/2020 4:36:20 PM By: Crystal Ham MD Entered By: Crystal Moore on 08/15/2020 11:46:39

## 2020-08-19 DIAGNOSIS — L97328 Non-pressure chronic ulcer of left ankle with other specified severity: Secondary | ICD-10-CM | POA: Diagnosis not present

## 2020-08-19 DIAGNOSIS — I739 Peripheral vascular disease, unspecified: Secondary | ICD-10-CM | POA: Diagnosis not present

## 2020-08-19 DIAGNOSIS — L97511 Non-pressure chronic ulcer of other part of right foot limited to breakdown of skin: Secondary | ICD-10-CM | POA: Diagnosis not present

## 2020-08-19 DIAGNOSIS — E11622 Type 2 diabetes mellitus with other skin ulcer: Secondary | ICD-10-CM | POA: Diagnosis not present

## 2020-08-19 DIAGNOSIS — R2681 Unsteadiness on feet: Secondary | ICD-10-CM | POA: Diagnosis not present

## 2020-08-19 DIAGNOSIS — E1151 Type 2 diabetes mellitus with diabetic peripheral angiopathy without gangrene: Secondary | ICD-10-CM | POA: Diagnosis not present

## 2020-08-19 DIAGNOSIS — R531 Weakness: Secondary | ICD-10-CM | POA: Diagnosis not present

## 2020-08-20 ENCOUNTER — Ambulatory Visit
Admission: RE | Admit: 2020-08-20 | Discharge: 2020-08-20 | Disposition: A | Discharge status: Home / Self Care | Payer: Medicare Other | Source: Ambulatory Visit | Attending: Internal Medicine | Admitting: Internal Medicine

## 2020-08-20 DIAGNOSIS — R2681 Unsteadiness on feet: Secondary | ICD-10-CM | POA: Diagnosis not present

## 2020-08-20 DIAGNOSIS — E1151 Type 2 diabetes mellitus with diabetic peripheral angiopathy without gangrene: Secondary | ICD-10-CM | POA: Diagnosis not present

## 2020-08-20 DIAGNOSIS — I739 Peripheral vascular disease, unspecified: Secondary | ICD-10-CM

## 2020-08-20 DIAGNOSIS — E11622 Type 2 diabetes mellitus with other skin ulcer: Secondary | ICD-10-CM | POA: Diagnosis not present

## 2020-08-20 DIAGNOSIS — L97511 Non-pressure chronic ulcer of other part of right foot limited to breakdown of skin: Secondary | ICD-10-CM | POA: Diagnosis not present

## 2020-08-20 DIAGNOSIS — R531 Weakness: Secondary | ICD-10-CM | POA: Diagnosis not present

## 2020-08-20 DIAGNOSIS — L97328 Non-pressure chronic ulcer of left ankle with other specified severity: Secondary | ICD-10-CM | POA: Diagnosis not present

## 2020-08-21 DIAGNOSIS — E11622 Type 2 diabetes mellitus with other skin ulcer: Secondary | ICD-10-CM | POA: Diagnosis not present

## 2020-08-21 DIAGNOSIS — R531 Weakness: Secondary | ICD-10-CM | POA: Diagnosis not present

## 2020-08-21 DIAGNOSIS — R2681 Unsteadiness on feet: Secondary | ICD-10-CM | POA: Diagnosis not present

## 2020-08-21 DIAGNOSIS — E1151 Type 2 diabetes mellitus with diabetic peripheral angiopathy without gangrene: Secondary | ICD-10-CM | POA: Diagnosis not present

## 2020-08-21 DIAGNOSIS — I739 Peripheral vascular disease, unspecified: Secondary | ICD-10-CM | POA: Diagnosis not present

## 2020-08-21 DIAGNOSIS — L97511 Non-pressure chronic ulcer of other part of right foot limited to breakdown of skin: Secondary | ICD-10-CM | POA: Diagnosis not present

## 2020-08-21 DIAGNOSIS — L97328 Non-pressure chronic ulcer of left ankle with other specified severity: Secondary | ICD-10-CM | POA: Diagnosis not present

## 2020-08-21 NOTE — Progress Notes (Signed)
Crystal, Moore ([id number]) Visit Report for 08/15/2020 Arrival Information Details Patient Name: Date of Service: Crystal Moore, CRITCHER. 08/15/2020 10:00 A M Medical Record Number: [id number] Patient Account Number: [id number] Date of Birth/Sex: Treating RN: 1946/03/02 (75 y.o. Crystal Moore, Crystal Moore Primary Care Crystal Moore: [name] Other Clinician: Referring Crystal Moore: Treating Crystal Moore/Extender: Crystal Moore in Treatment: 1 Visit Information History Since Last Visit Added or deleted any medications: No Patient Arrived: Wheel Chair Any new allergies or adverse reactions: No Arrival Time: 10:22 Had a fall or experienced change in No Accompanied By: son activities of daily living that may affect Transfer Assistance: None risk of falls: Patient Identification Verified: Yes Signs or symptoms of abuse/neglect since last visito No Secondary Verification Process Completed: Yes Hospitalized since last visit: No Patient Requires Transmission-Based Precautions: No Implantable device outside of the clinic excluding No Patient Has Alerts: Yes cellular tissue based products placed in the center Patient Alerts: Patient on Blood Thinner since last visit: Has Dressing in Place as Prescribed: Yes Pain Present Now: Yes Electronic Signature(s) Signed: 08/15/2020 2:45:22 PM By: Sandre Kitty Entered By: Sandre Kitty on 08/15/2020 10:24:48 -------------------------------------------------------------------------------- Clinic Level of Care Assessment Details Patient Name: Date of Service: Crystal Moore, KITCHEN 08/15/2020 10:00 A M Medical Record Number: [id number] Patient Account Number: [id number] Date of Birth/Sex: Treating RN: February 09, 1946 (75 y.o. Crystal Moore, Crystal Moore Primary Care [name]: [name] Other Clinician: Referring Crystal Moore: Treating Crystal Moore/Extender: Crystal Moore in Treatment: 1 Clinic Level of Care  Assessment Items TOOL 4 Quantity Score []  - 0 Use when only an EandM is performed on FOLLOW-UP visit ASSESSMENTS - Nursing Assessment / Reassessment X- 1 10 Reassessment of Co-morbidities (includes updates in patient status) X- 1 5 Reassessment of Adherence to Treatment Plan ASSESSMENTS - Wound and Skin A ssessment / Reassessment []  - 0 Simple Wound Assessment / Reassessment - one wound X- 2 5 Complex Wound Assessment / Reassessment - multiple wounds X- 1 10 Dermatologic / Skin Assessment (not related to wound area) ASSESSMENTS - Focused Assessment X- 2 5 Circumferential Edema Measurements - multi extremities []  - 0 Nutritional Assessment / Counseling / Intervention []  - 0 Lower Extremity Assessment (monofilament, tuning fork, pulses) []  - 0 Peripheral Arterial Disease Assessment (using hand held doppler) ASSESSMENTS - Ostomy and/or Continence Assessment and Care []  - 0 Incontinence Assessment and Management []  - 0 Ostomy Care Assessment and Management (repouching, etc.) PROCESS - Coordination of Care []  - 0 Simple Patient / Family Education for ongoing care X- 1 20 Complex (extensive) Patient / Family Education for ongoing care X- 1 10 Staff obtains Programmer, systems, Records, T Results / Process Orders est X- 1 10 Staff telephones HHA, Nursing Homes / Clarify orders / etc []  - 0 Routine Transfer to another Facility (non-emergent condition) []  - 0 Routine Hospital Admission (non-emergent condition) []  - 0 New Admissions / Biomedical engineer / Ordering NPWT Apligraf, etc. , []  - 0 Emergency Hospital Admission (emergent condition) []  - 0 Simple Discharge Coordination X- 1 15 Complex (extensive) Discharge Coordination PROCESS - Special Needs []  - 0 Pediatric / Minor Patient Management []  - 0 Isolation Patient Management []  - 0 Hearing / Language / Visual special needs []  - 0 Assessment of Community assistance (transportation, D/C planning, etc.) []  -  0 Additional assistance / Altered mentation []  - 0 Support Surface(s) Assessment (bed, cushion, seat, etc.) INTERVENTIONS - Wound Cleansing / Measurement []  - 0 Simple Wound Cleansing - one wound X- 2 5 Complex Wound  Cleansing - multiple wounds X- 1 5 Wound Imaging (photographs - any number of wounds) []  - 0 Wound Tracing (instead of photographs) []  - 0 Simple Wound Measurement - one wound X- 2 5 Complex Wound Measurement - multiple wounds INTERVENTIONS - Wound Dressings X - Small Wound Dressing one or multiple wounds 2 10 []  - 0 Medium Wound Dressing one or multiple wounds []  - 0 Large Wound Dressing one or multiple wounds []  - 0 Application of Medications - topical []  - 0 Application of Medications - injection INTERVENTIONS - Miscellaneous []  - 0 External ear exam []  - 0 Specimen Collection (cultures, biopsies, blood, body fluids, etc.) []  - 0 Specimen(s) / Culture(s) sent or taken to Lab for analysis []  - 0 Patient Transfer (multiple staff / Civil Service fast streamer / Similar devices) []  - 0 Simple Staple / Suture removal (25 or less) []  - 0 Complex Staple / Suture removal (26 or more) []  - 0 Hypo / Hyperglycemic Management (close monitor of Blood Glucose) []  - 0 Ankle / Brachial Index (ABI) - do not check if billed separately X- 1 5 Vital Signs Has the patient been seen at the hospital within the last three years: Yes Total Score: 150 Level Of Care: New/Established - Level 4 Electronic Signature(s) Signed: 08/15/2020 4:56:08 PM By: Deon Pilling Entered By: Deon Pilling on 08/15/2020 11:00:30 -------------------------------------------------------------------------------- Encounter Discharge Information Details Patient Name: Date of Service: [name] 08/15/2020 10:00 A M Medical Record Number: [id number] Patient Account Number: [id number] Date of Birth/Sex: Treating RN: September 05, 1945 (75 y.o. Crystal Moore, Crystal Moore Primary Care Joevon Holliman: [name] Other  Clinician: Referring Dayson Aboud: Treating Lynzi Meulemans/Extender: Crystal Moore in Treatment: 1 Encounter Discharge Information Items Discharge Condition: Stable Ambulatory Status: Wheelchair Discharge Destination: Home Transportation: Private Auto Accompanied By: family Schedule Follow-up Appointment: Yes Clinical Summary of Care: Patient Declined Electronic Signature(s) Signed: 08/21/2020 5:56:51 PM By: Rhae Hammock RN Entered By: Rhae Hammock on 08/15/2020 11:24:02 -------------------------------------------------------------------------------- Lower Extremity Assessment Details Patient Name: Date of Service: Crystal Moore. 08/15/2020 10:00 A M Medical Record Number: [id number] Patient Account Number: [id number] Date of Birth/Sex: Treating RN: Oct 06, 1945 (74 y.o. [name] Primary Care Connor Foxworthy: [name] Other Clinician: Referring Wannetta Langland: Treating Ali Mohl/Extender: Crystal Moore in Treatment: 1 Edema Assessment Assessed: [Left: No] [Right: No] Edema: [Left: No] [Right: No] Calf Left: Right: Point of Measurement: 27 cm From Medial Instep 30.5 cm 30 cm Ankle Left: Right: Point of Measurement: 11 cm From Medial Instep 18.8 cm 17.5 cm Vascular Assessment Pulses: Dorsalis Pedis Palpable: [Left:No] [Right:No] Electronic Signature(s) Signed: 08/15/2020 4:58:08 PM By: Baruch Gouty RN, BSN Entered By: Baruch Gouty on 08/15/2020 10:37:33 -------------------------------------------------------------------------------- Multi Wound Chart Details Patient Name: Date of Service: [name] 08/15/2020 10:00 A M Medical Record Number: [id number] Patient Account Number: [id number] Date of Birth/Sex: Treating RN: 1945-08-19 (75 y.o. Debby Bud Primary Care Raidon Swanner: [name] Other Clinician: Referring Andrell Bergeson: Treating Kymiah Araiza/Extender: Crystal Moore in Treatment: 1 Vital Signs Height(in): Pulse(bpm): 36 Weight(lbs): Blood Pressure(mmHg): 189/74 Body Mass Index(BMI): Temperature(F): 98.5 Respiratory Rate(breaths/min): 16 Photos: [1:No Photos Left, Lateral Malleolus] [2:No Photos Right, Anterior Lower Leg] [N/A:N/A N/A] Wound Location: [1:Pressure Injury] [2:Trauma] [N/A:N/A] Wounding Event: [1:Arterial Insufficiency Ulcer] [2:Trauma, Other] [N/A:N/A] Primary Etiology: [1:Chronic Obstructive Pulmonary] [2:Chronic Obstructive Pulmonary] [N/A:N/A] Comorbid History: [1:Disease (COPD), Hypertension, Type Disease (COPD), Hypertension, Type II Diabetes, Osteoarthritis, Seizure II Diabetes, Osteoarthritis, Seizure Disorder 03/27/2020] [2:Disorder 06/26/2020] [N/A:N/A] Date Acquired: [1:1] [2:1] [N/A:N/A] Weeks of  Treatment: [1:Open] [2:Open] [N/A:N/A] Wound Status: [1:1.8x1.3x0.1] [2:0.2x0.2x0.1] [N/A:N/A] Measurements L x W x D (cm) [1:1.838] [2:0.031] [N/A:N/A] A (cm) : rea [1:0.184] [2:0.003] [N/A:N/A] Volume (cm) : [1:-95.10%] [2:91.80%] [N/A:N/A] % Reduction in Area: [1:2.10%] [2:92.10%] [N/A:N/A] % Reduction in Volume: [1:Full Thickness Without Exposed] [2:Full Thickness Without Exposed] [N/A:N/A] Classification: [1:Support Structures Medium] [2:Support Structures None Present] [N/A:N/A] Exudate Amount: [1:Serosanguineous] [2:N/A] [N/A:N/A] Exudate Type: [1:red, brown] [2:N/A] [N/A:N/A] Exudate Color: [1:Thickened] [2:Flat and Intact] [N/A:N/A] Wound Margin: [1:Medium (34-66%)] [2:Small (1-33%)] [N/A:N/A] Granulation Amount: [1:Pink, Pale] [2:Pink] [N/A:N/A] Granulation Quality: [1:Medium (34-66%)] [2:None Present (0%)] [N/A:N/A] Necrotic Amount: [1:Fat Layer (Subcutaneous Tissue): Yes Fascia: No] [N/A:N/A] Exposed Structures: [1:Fascia: No Tendon: No Muscle: No Joint: No Bone: No None] [2:Fat Layer (Subcutaneous Tissue): No Tendon: No Muscle: No Joint: No Bone: No Limited to Skin Breakdown  Large (67-100%)] [N/A:N/A] Treatment Notes Wound #1 (Malleolus) Wound Laterality: Left, Lateral Cleanser Soap and Water Discharge Instruction: May shower and wash wound with dial antibacterial soap and water prior to dressing change. Peri-Wound Care Skin Prep Discharge Instruction: Use skin prep as directed Topical Primary Dressing Iodosorb Gel 10 (gm) Tube Discharge Instruction: or iodoflex.Apply to wound bed as instructed Secondary Dressing Bordered Gauze, 4x4 in Discharge Instruction: or bordered foam. Apply over primary dressing as directed. Secured With Compression Wrap Compression Stockings Add-Ons Wound #2 (Lower Leg) Wound Laterality: Right, Anterior Cleanser Soap and Water Discharge Instruction: May shower and wash wound with dial antibacterial soap and water prior to dressing change. Peri-Wound Care Skin Prep Discharge Instruction: Use skin prep as directed Topical Primary Dressing Maxorb Extra Calcium Alginate 2x2 in Discharge Instruction: Apply calcium alginate to wound bed as instructed Secondary Dressing Bordered Gauze, 2x3.75 in Discharge Instruction: or bordered foam. Apply over primary dressing as directed. Secured With Compression Wrap Compression Stockings Environmental education officer) Signed: 08/15/2020 4:36:20 PM By: Linton Ham MD Signed: 08/15/2020 4:56:08 PM By: Deon Pilling Entered By: Linton Ham on 08/15/2020 11:42:21 -------------------------------------------------------------------------------- Multi-Disciplinary Care Plan Details Patient Name: Date of Service: Crystal Moore. 08/15/2020 10:00 A M Medical Record Number: [id number] Patient Account Number: [id number] Date of Birth/Sex: Treating RN: Jan 29, 1946 (75 y.o. Crystal Moore, Crystal Moore Primary Care Yobani Schertzer: [name] Other Clinician: Referring Jahan Friedlander: Treating Lindee Leason/Extender: Crystal Moore in Treatment: 1 Active  Inactive Abuse / Safety / Falls / Self Care Management Nursing Diagnoses: Potential for falls Goals: Patient will not experience any injury related to falls Date Initiated: 08/08/2020 Target Resolution Date: 11/01/2020 Goal Status: Active Patient/caregiver will verbalize understanding of skin care regimen Date Initiated: 08/08/2020 Target Resolution Date: 10/04/2020 Goal Status: Active Interventions: Provide education on fall prevention Notes: Nutrition Nursing Diagnoses: Potential for alteratiion in Nutrition/Potential for imbalanced nutrition Goals: Patient/caregiver agrees to and verbalizes understanding of need to obtain nutritional consultation Date Initiated: 08/08/2020 Target Resolution Date: 10/04/2020 Goal Status: Active Interventions: Provide education on elevated blood sugars and impact on wound healing Provide education on nutrition Treatment Activities: Patient referred to Primary Care Physician for further nutritional evaluation : 08/08/2020 Notes: Tissue Oxygenation Nursing Diagnoses: Potential alteration in peripheral tissue perfusion (select prior to confirmation of diagnosis) Goals: Non-invasive arterial studies are completed as ordered Date Initiated: 08/08/2020 Target Resolution Date: 08/30/2020 Goal Status: Active Interventions: Assess patient understanding of disease process and management upon diagnosis and as needed Assess peripheral arterial status upon admission and as needed Provide education on tissue oxygenation and ischemia Treatment Activities: Non-invasive vascular studies : 08/08/2020 T ordered outside of clinic : 08/08/2020 est Notes: Wound/Skin Impairment Nursing Diagnoses:  Knowledge deficit related to ulceration/compromised skin integrity Goals: Patient/caregiver will verbalize understanding of skin care regimen Date Initiated: 08/08/2020 Target Resolution Date: 10/04/2020 Goal Status: Active Ulcer/skin breakdown will heal within 14  weeks Date Initiated: 08/08/2020 Target Resolution Date: 11/29/2020 Goal Status: Active Interventions: Assess patient/caregiver ability to obtain necessary supplies Assess patient/caregiver ability to perform ulcer/skin care regimen upon admission and as needed Provide education on ulcer and skin care Treatment Activities: Skin care regimen initiated : 08/08/2020 Topical wound management initiated : 08/08/2020 Notes: Electronic Signature(s) Signed: 08/15/2020 4:56:08 PM By: Deon Pilling Entered By: Deon Pilling on 08/15/2020 10:40:43 -------------------------------------------------------------------------------- Pain Assessment Details Patient Name: Date of Service: YASHIKA, MASK. 08/15/2020 10:00 A M Medical Record Number: [id number] Patient Account Number: [id number] Date of Birth/Sex: Treating RN: 08-Aug-1945 (75 y.o. Debby Bud Primary Care Ayane Delancey: [name] Other Clinician: Referring Tamieka Rancourt: Treating Edyn Qazi/Extender: Crystal Moore in Treatment: 1 Active Problems Location of Pain Severity and Description of Pain Patient Has Paino Yes Site Locations Rate the pain. Current Pain Level: 5 Pain Management and Medication Current Pain Management: Electronic Signature(s) Signed: 08/15/2020 2:45:22 PM By: Sandre Kitty Signed: 08/15/2020 4:56:08 PM By: Deon Pilling Entered By: Sandre Kitty on 08/15/2020 10:25:39 -------------------------------------------------------------------------------- Patient/Caregiver Education Details Patient Name: Date of Service: Crystal Moore 1/20/2022andnbsp10:00 A M Medical Record Number: [id number] Patient Account Number: [id number] Date of Birth/Gender: Treating RN: May 04, 1946 (75 y.o. Debby Bud Primary Care Physician: [name] Other Clinician: Referring Physician: Treating Physician/Extender: Crystal Moore in Treatment:  1 Education Assessment Education Provided To: Patient Education Topics Provided Elevated Blood Sugar/ Impact on Healing: Handouts: Elevated Blood Sugars: How Do They Affect Wound Healing Methods: Explain/Verbal Responses: Reinforcements needed Electronic Signature(s) Signed: 08/15/2020 4:56:08 PM By: Deon Pilling Entered By: Deon Pilling on 08/15/2020 10:40:57 -------------------------------------------------------------------------------- Wound Assessment Details Patient Name: Date of Service: Crystal Moore, CARTELLI. 08/15/2020 10:00 A M Medical Record Number: [id number] Patient Account Number: [id number] Date of Birth/Sex: Treating RN: 07/11/1946 (75 y.o. Crystal Moore, Crystal Moore Primary Care Wood Novacek: [name] Other Clinician: Referring Jonah Nestle: Treating Duffy Dantonio/Extender: Crystal Moore in Treatment: 1 Wound Status Wound Number: 1 Primary Arterial Insufficiency Ulcer Etiology: Wound Location: Left, Lateral Malleolus Wound Open Wounding Event: Pressure Injury Status: Date Acquired: 03/27/2020 Comorbid Chronic Obstructive Pulmonary Disease (COPD), Hypertension, Weeks Of Treatment: 1 History: Type II Diabetes, Osteoarthritis, Seizure Disorder Clustered Wound: No Photos Photo Uploaded By: Mikeal Hawthorne on 08/16/2020 10:26:36 Wound Measurements Length: (cm) 1.8 Width: (cm) 1.3 Depth: (cm) 0.1 Area: (cm) 1.838 Volume: (cm) 0.184 % Reduction in Area: -95.1% % Reduction in Volume: 2.1% Epithelialization: None Tunneling: No Undermining: No Wound Description Classification: Full Thickness Without Exposed Support Structures Wound Margin: Thickened Exudate Amount: Medium Exudate Type: Serosanguineous Exudate Color: red, brown Foul Odor After Cleansing: No Slough/Fibrino Yes Wound Bed Granulation Amount: Medium (34-66%) Exposed Structure Granulation Quality: Pink, Pale Fascia Exposed: No Necrotic Amount: Medium (34-66%) Fat Layer  (Subcutaneous Tissue) Exposed: Yes Necrotic Quality: Adherent Slough Tendon Exposed: No Muscle Exposed: No Joint Exposed: No Bone Exposed: No Treatment Notes Wound #1 (Malleolus) Wound Laterality: Left, Lateral Cleanser Soap and Water Discharge Instruction: May shower and wash wound with dial antibacterial soap and water prior to dressing change. Peri-Wound Care Skin Prep Discharge Instruction: Use skin prep as directed Topical Primary Dressing Iodosorb Gel 10 (gm) Tube Discharge Instruction: or iodoflex.Apply to wound bed as instructed Secondary Dressing Bordered Gauze, 4x4 in Discharge Instruction: or bordered foam. Apply over primary dressing as  directed. Secured With Compression Wrap Compression Stockings Environmental education officer) Signed: 08/15/2020 4:56:08 PM By: Deon Pilling Signed: 08/15/2020 4:58:08 PM By: Baruch Gouty RN, BSN Entered By: Baruch Gouty on 08/15/2020 10:37:59 -------------------------------------------------------------------------------- Wound Assessment Details Patient Name: Date of Service: Crystal Moore. 08/15/2020 10:00 A M Medical Record Number: [id number] Patient Account Number: [id number] Date of Birth/Sex: Treating RN: 1946-01-21 (75 y.o. Crystal Moore, Crystal Moore Primary Care Mikalia Fessel: [name] Other Clinician: Referring Xayne Brumbaugh: Treating Petronella Shuford/Extender: Crystal Moore in Treatment: 1 Wound Status Wound Number: 2 Primary Trauma, Other Etiology: Wound Location: Right, Anterior Lower Leg Wound Open Wounding Event: Trauma Status: Date Acquired: 06/26/2020 Comorbid Chronic Obstructive Pulmonary Disease (COPD), Hypertension, Weeks Of Treatment: 1 History: Type II Diabetes, Osteoarthritis, Seizure Disorder Clustered Wound: No Photos Photo Uploaded By: Mikeal Hawthorne on 08/16/2020 10:26:36 Wound Measurements Length: (cm) 0.2 Width: (cm) 0.2 Depth: (cm) 0.1 Area: (cm)  0.031 Volume: (cm) 0.003 % Reduction in Area: 91.8% % Reduction in Volume: 92.1% Epithelialization: Large (67-100%) Tunneling: No Undermining: No Wound Description Classification: Full Thickness Without Exposed Support Structures Wound Margin: Flat and Intact Exudate Amount: None Present Foul Odor After Cleansing: No Slough/Fibrino No Wound Bed Granulation Amount: Small (1-33%) Exposed Structure Granulation Quality: Pink Fascia Exposed: No Necrotic Amount: None Present (0%) Fat Layer (Subcutaneous Tissue) Exposed: No Tendon Exposed: No Muscle Exposed: No Joint Exposed: No Bone Exposed: No Limited to Skin Breakdown Treatment Notes Wound #2 (Lower Leg) Wound Laterality: Right, Anterior Cleanser Soap and Water Discharge Instruction: May shower and wash wound with dial antibacterial soap and water prior to dressing change. Peri-Wound Care Skin Prep Discharge Instruction: Use skin prep as directed Topical Primary Dressing Maxorb Extra Calcium Alginate 2x2 in Discharge Instruction: Apply calcium alginate to wound bed as instructed Secondary Dressing Bordered Gauze, 2x3.75 in Discharge Instruction: or bordered foam. Apply over primary dressing as directed. Secured With Compression Wrap Compression Stockings Environmental education officer) Signed: 08/15/2020 4:56:08 PM By: Deon Pilling Signed: 08/15/2020 4:58:08 PM By: Baruch Gouty RN, BSN Entered By: Baruch Gouty on 08/15/2020 10:38:20 -------------------------------------------------------------------------------- Vitals Details Patient Name: Date of Service: Crystal Moore. 08/15/2020 10:00 A M Medical Record Number: [id number] Patient Account Number: [id number] Date of Birth/Sex: Treating RN: 09-02-1945 (75 y.o. Debby Bud Primary Care Fausto Sampedro: [name] Other Clinician: Referring Zamantha Strebel: Treating Alisan Dokes/Extender: Crystal Moore in Treatment: 1 Vital  Signs Time Taken: 10:24 Temperature (F): 98.5 Pulse (bpm): 90 Respiratory Rate (breaths/min): 16 Blood Pressure (mmHg): 189/74 Reference Range: 80 - 120 mg / dl Electronic Signature(s) Signed: 08/15/2020 2:45:22 PM By: Sandre Kitty Entered By: Sandre Kitty on 08/15/2020 10:25:03

## 2020-08-22 ENCOUNTER — Other Ambulatory Visit: Payer: Self-pay

## 2020-08-22 ENCOUNTER — Encounter (HOSPITAL_BASED_OUTPATIENT_CLINIC_OR_DEPARTMENT_OTHER): Payer: Medicare Other | Admitting: Internal Medicine

## 2020-08-22 DIAGNOSIS — I739 Peripheral vascular disease, unspecified: Secondary | ICD-10-CM | POA: Diagnosis not present

## 2020-08-22 DIAGNOSIS — L97812 Non-pressure chronic ulcer of other part of right lower leg with fat layer exposed: Secondary | ICD-10-CM | POA: Diagnosis not present

## 2020-08-22 DIAGNOSIS — R531 Weakness: Secondary | ICD-10-CM | POA: Diagnosis not present

## 2020-08-22 DIAGNOSIS — L97322 Non-pressure chronic ulcer of left ankle with fat layer exposed: Secondary | ICD-10-CM | POA: Diagnosis not present

## 2020-08-22 DIAGNOSIS — R2681 Unsteadiness on feet: Secondary | ICD-10-CM | POA: Diagnosis not present

## 2020-08-22 DIAGNOSIS — E11621 Type 2 diabetes mellitus with foot ulcer: Secondary | ICD-10-CM | POA: Diagnosis not present

## 2020-08-22 DIAGNOSIS — L97328 Non-pressure chronic ulcer of left ankle with other specified severity: Secondary | ICD-10-CM | POA: Diagnosis not present

## 2020-08-22 DIAGNOSIS — L97511 Non-pressure chronic ulcer of other part of right foot limited to breakdown of skin: Secondary | ICD-10-CM | POA: Diagnosis not present

## 2020-08-22 DIAGNOSIS — E1151 Type 2 diabetes mellitus with diabetic peripheral angiopathy without gangrene: Secondary | ICD-10-CM | POA: Diagnosis not present

## 2020-08-22 DIAGNOSIS — E11622 Type 2 diabetes mellitus with other skin ulcer: Secondary | ICD-10-CM | POA: Diagnosis not present

## 2020-08-22 DIAGNOSIS — Z87891 Personal history of nicotine dependence: Secondary | ICD-10-CM | POA: Diagnosis not present

## 2020-08-22 NOTE — Progress Notes (Signed)
HAJER, DWYER ([id number]) Visit Report for 08/22/2020 Arrival Information Details Patient Name: Date of Service: Crystal, Moore 08/22/2020 10:45 A M Medical Record Number: [id number] Patient Account Number: [id number] Date of Birth/Sex: Treating RN: 06/11/1946 (75 y.o. [name], [name] Primary Care Rashard Ryle: [name] Other Clinician: Referring Kyliana Standen: Treating Thomasena Vandenheuvel/Extender: Gabriel Rainwater in Treatment: 2 Visit Information History Since Last Visit Added or deleted any medications: No Patient Arrived: Wheel Chair Any new allergies or adverse reactions: No Arrival Time: 10:53 Had a fall or experienced change in No Accompanied By: self activities of daily living that may affect Transfer Assistance: None risk of falls: Patient Identification Verified: Yes Signs or symptoms of abuse/neglect since last visito No Secondary Verification Process Completed: Yes Hospitalized since last visit: No Patient Requires Transmission-Based Precautions: No Implantable device outside of the clinic excluding No Patient Has Alerts: Yes cellular tissue based products placed in the center Patient Alerts: Patient on Blood Thinner since last visit: Has Dressing in Place as Prescribed: Yes Pain Present Now: No Electronic Signature(s) Signed: 08/22/2020 3:17:01 PM By: Sandre Kitty Entered By: Sandre Kitty on 08/22/2020 10:55:02 -------------------------------------------------------------------------------- Clinic Level of Care Assessment Details Patient Name: Date of Service: Crystal, Moore 08/22/2020 10:45 A M Medical Record Number: [id number] Patient Account Number: [id number] Date of Birth/Sex: Treating RN: 1946-02-25 (75 y.o. [name], [name] Primary Care Adryen Cookson: [name] Other Clinician: Referring Rylei Masella: Treating Zriyah Kopplin/Extender: Gabriel Rainwater in Treatment: 2 Clinic Level of Care  Assessment Items TOOL 4 Quantity Score X- 1 0 Use when only an EandM is performed on FOLLOW-UP visit ASSESSMENTS - Nursing Assessment / Reassessment X- 1 10 Reassessment of Co-morbidities (includes updates in patient status) X- 1 5 Reassessment of Adherence to Treatment Plan ASSESSMENTS - Wound and Skin A ssessment / Reassessment _0  - 0 Simple Wound Assessment / Reassessment - one wound X- 3 5 Complex Wound Assessment / Reassessment - multiple wounds X- 1 10 Dermatologic / Skin Assessment (not related to wound area) ASSESSMENTS - Focused Assessment X- 2 5 Circumferential Edema Measurements - multi extremities X- 1 10 Nutritional Assessment / Counseling / Intervention _1  - 0 Lower Extremity Assessment (monofilament, tuning fork, pulses) _2  - 0 Peripheral Arterial Disease Assessment (using hand held doppler) ASSESSMENTS - Ostomy and/or Continence Assessment and Care _3  - 0 Incontinence Assessment and Management _4  - 0 Ostomy Care Assessment and Management (repouching, etc.) PROCESS - Coordination of Care _5  - 0 Simple Patient / Family Education for ongoing care X- 1 20 Complex (extensive) Patient / Family Education for ongoing care X- 1 10 Staff obtains Programmer, systems, Records, T Results / Process Orders est X- 1 10 Staff telephones HHA, Nursing Homes / Clarify orders / etc _6  - 0 Routine Transfer to another Facility (non-emergent condition) _7  - 0 Routine Hospital Admission (non-emergent condition) _8  - 0 New Admissions / Biomedical engineer / Ordering NPWT Apligraf, etc. , _9  - 0 Emergency Hospital Admission (emergent condition) _10  - 0 Simple Discharge Coordination X- 1 15 Complex (extensive) Discharge Coordination PROCESS - Special Needs _11  - 0 Pediatric / Minor Patient Management _12  - 0 Isolation Patient Management _13  - 0 Hearing / Language / Visual special needs _14  - 0 Assessment of Community assistance (transportation, D/C planning, etc.) _15  -  0 Additional assistance / Altered mentation _16  - 0 Support Surface(s) Assessment (bed, cushion, seat, etc.) INTERVENTIONS - Wound Cleansing / Measurement _17  - 0 Simple Wound Cleansing - one wound X- 3 5 Complex Wound  Cleansing - multiple wounds X- 1 5 Wound Imaging (photographs - any number of wounds) _0  - 0 Wound Tracing (instead of photographs) _1  - 0 Simple Wound Measurement - one wound X- 3 5 Complex Wound Measurement - multiple wounds INTERVENTIONS - Wound Dressings X - Small Wound Dressing one or multiple wounds 2 10 _2  - 0 Medium Wound Dressing one or multiple wounds _3  - 0 Large Wound Dressing one or multiple wounds <ZOXWRUEAVWUJWJXB>_1<\/YNWGNFAOZHYQMVHQ>_4  - 0 Application of Medications - topical <ONGEXBMWUXLKGMWN>_0<\/UVOZDGUYQIHKVQQV>_9  - 0 Application of Medications - injection INTERVENTIONS - Miscellaneous _6  - 0 External ear exam _7  - 0 Specimen Collection (cultures, biopsies, blood, body fluids, etc.) _8  - 0 Specimen(s) / Culture(s) sent or taken to Lab for analysis _9  - 0 Patient Transfer (multiple staff / Civil Service fast streamer / Similar devices) _10  - 0 Simple Staple / Suture removal (25 or less) _11  - 0 Complex Staple / Suture removal (26 or more) _12  - 0 Hypo / Hyperglycemic Management (close monitor of Blood Glucose) _13  - 0 Ankle / Brachial Index (ABI) - do not check if billed separately X- 1 5 Vital Signs Has the patient been seen at the hospital within the last three years: Yes Total Score: 175 Level Of Care: New/Established - Level 5 Electronic Signature(s) Signed: 08/22/2020 5:42:01 PM By: Deon Pilling Entered By: Deon Pilling on 08/22/2020 13:04:58 -------------------------------------------------------------------------------- Encounter Discharge Information Details Patient Name: Date of Service: [name] 08/22/2020 10:45 A M Medical Record Number: [id number] Patient Account Number: [id number] Date of Birth/Sex: Treating RN: 1945/07/28 (75 y.o. [name] Primary Care Kiefer Opheim: [name] Other  Clinician: Referring Karrine Kluttz: Treating Dariel Betzer/Extender: Gabriel Rainwater in Treatment: 2 Encounter Discharge Information Items Discharge Condition: Stable Ambulatory Status: Wheelchair Discharge Destination: Home Transportation: Private Auto Accompanied By: son Schedule Follow-up Appointment: Yes Clinical Summary of Care: Patient Declined Electronic Signature(s) Signed: 08/22/2020 5:45:53 PM By: Baruch Gouty RN, BSN Entered By: Baruch Gouty on 08/22/2020 11:57:13 -------------------------------------------------------------------------------- Lower Extremity Assessment Details Patient Name: Date of Service: [name] 08/22/2020 10:45 A M Medical Record Number: [id number] Patient Account Number: [id number] Date of Birth/Sex: Treating RN: 27-May-1946 (75 y.o. [name] Primary Care Pierce Biagini: [name] Other Clinician: Referring Edon Hoadley: Treating Lamae Fosco/Extender: Gabriel Rainwater in Treatment: 2 Edema Assessment Assessed: [Left: No] [Right: No] Edema: [Left: No] [Right: No] Calf Left: Right: Point of Measurement: 27 cm From Medial Instep 30.5 cm 30 cm Ankle Left: Right: Point of Measurement: 11 cm From Medial Instep 18.8 cm 17.5 cm Vascular Assessment Pulses: Dorsalis Pedis Palpable: [Left:No] [Right:No] Electronic Signature(s) Signed: 08/22/2020 5:45:53 PM By: Baruch Gouty RN, BSN Entered By: Baruch Gouty on 08/22/2020 11:09:42 -------------------------------------------------------------------------------- Multi Wound Chart Details Patient Name: Date of Service: [name] 08/22/2020 10:45 A M Medical Record Number: [id number] Patient Account Number: [id number] Date of Birth/Sex: Treating RN: 1946/03/26 (75 y.o. Debby Bud Primary Care Adisynn Suleiman: [name] Other Clinician: Referring Wister Hoefle: Treating Zacharey Jensen/Extender: Gabriel Rainwater in Treatment: 2 Vital Signs Height(in): Pulse(bpm): 48 Weight(lbs): Blood Pressure(mmHg): 161/72 Body Mass Index(BMI): Temperature(F): 97.4 Respiratory Rate(breaths/min): 16 Photos: [1:No Photos Left, Lateral Malleolus] [2:No Photos Right, Anterior Lower Leg] [3:No Photos Right Knee] Wound Location: [1:Pressure Injury] [2:Trauma] [3:Gradually Appeared] Wounding Event: [1:Arterial Insufficiency Ulcer] [2:Trauma, Other] [3:Diabetic Wound/Ulcer of the Lower] Primary Etiology: [1:Chronic Obstructive Pulmonary] [2:Chronic Obstructive Pulmonary] [3:Extremity Chronic Obstructive Pulmonary] Comorbid History: [1:Disease (COPD), Hypertension, Type Disease (COPD), Hypertension, Type Disease (COPD), Hypertension, Type II Diabetes, Osteoarthritis, Seizure II  Diabetes, Osteoarthritis, Seizure II Diabetes, Osteoarthritis, Seizure Disorder  03/27/2020] [2:Disorder 06/26/2020] [3:Disorder 08/22/2020] Date Acquired: [1:2] [2:2] [3:0] Weeks of Treatment: [1:Open] [2:Healed - Epithelialized] [3:Open] Wound Status: [1:1.6x1.1x0.1] [2:0x0x0] [3:1x0.7x0.1] Measurements L x W x D (cm) [1:1.382] [2:0] [3:0.55] A (cm) : rea [1:0.138] [2:0] [3:0.055] Volume (cm) : [1:-46.70%] [2:100.00%] [3:0.00%] % Reduction in Area: [1:26.60%] [2:100.00%] [3:0.00%] % Reduction in Volume: [1:Full Thickness Without Exposed] [2:Full Thickness Without Exposed] [3:Grade 1] Classification: [1:Support Structures Medium] [2:Support Structures None Present] [3:Small] Exudate Amount: [1:Serosanguineous] [2:N/A] [3:Serosanguineous] Exudate Type: [1:red, brown] [2:N/A] [3:red, brown] Exudate Color: [1:Distinct, outline attached] [2:Flat and Intact] [3:Flat and Intact] Wound Margin: [1:Small (1-33%)] [2:None Present (0%)] [3:None Present (0%)] Granulation Amount: [1:Pink, Pale] [2:N/A] [3:N/A] Granulation Quality: [1:Large (67-100%)] [2:None Present (0%)] [3:Large (67-100%)] Necrotic Amount: [1:Fat  Layer (Subcutaneous Tissue): Yes Fascia: No] [3:Fat Layer (Subcutaneous Tissue): Yes] Exposed Structures: [1:Fascia: No Tendon: No Muscle: No Joint: No Bone: No None] [2:Fat Layer (Subcutaneous Tissue): No Fascia: No Tendon: No Muscle: No Joint: No Bone: No Limited to Skin Breakdown Large (67-100%)] [3:Tendon: No Muscle: No Joint: No Bone: No None] Treatment Notes Wound #1 (Malleolus) Wound Laterality: Left, Lateral Cleanser Soap and Water Discharge Instruction: May shower and wash wound with dial antibacterial soap and water prior to dressing change. Peri-Wound Care Skin Prep Discharge Instruction: Use skin prep as directed Topical Primary Dressing Iodosorb Gel 10 (gm) Tube Discharge Instruction: or iodoflex.Apply to wound bed as instructed Secondary Dressing Bordered Gauze, 4x4 in Discharge Instruction: or bordered foam. Apply over primary dressing as directed. Secured With Compression Wrap Compression Stockings Add-Ons Wound #3 (Knee) Wound Laterality: Right Cleanser Soap and Water Discharge Instruction: May shower and wash wound with dial antibacterial soap and water prior to dressing change. Peri-Wound Care Skin Prep Discharge Instruction: Use skin prep as directed Topical Primary Dressing Iodosorb Gel 10 (gm) Tube Discharge Instruction: or iodoflex.Apply to wound bed as instructed Secondary Dressing Bordered Gauze, 4x4 in Discharge Instruction: or bordered foam. May apply stretch net over dressing to hold in place. Apply over primary dressing as directed. Secured With Compression Wrap Compression Stockings Environmental education officer) Signed: 08/22/2020 4:56:03 PM By: Linton Ham MD Signed: 08/22/2020 5:42:01 PM By: Deon Pilling Entered By: Linton Ham on 08/22/2020 16:48:45 -------------------------------------------------------------------------------- Multi-Disciplinary Care Plan Details Patient Name: Date of Service: [name] 08/22/2020  10:45 A M Medical Record Number: [id number] Patient Account Number: [id number] Date of Birth/Sex: Treating RN: 11/25/1945 (75 y.o. [name], [name] Primary Care Xaivier Malay: [name] Other Clinician: Referring Louie Meaders: Treating Jalani Cullifer/Extender: Gabriel Rainwater in Treatment: 2 Active Inactive Abuse / Safety / Falls / Self Care Management Nursing Diagnoses: Potential for falls Goals: Patient will not experience any injury related to falls Date Initiated: 08/08/2020 Target Resolution Date: 11/01/2020 Goal Status: Active Patient/caregiver will verbalize understanding of skin care regimen Date Initiated: 08/08/2020 Target Resolution Date: 10/04/2020 Goal Status: Active Interventions: Provide education on fall prevention Notes: Nutrition Nursing Diagnoses: Potential for alteratiion in Nutrition/Potential for imbalanced nutrition Goals: Patient/caregiver agrees to and verbalizes understanding of need to obtain nutritional consultation Date Initiated: 08/08/2020 Target Resolution Date: 10/04/2020 Goal Status: Active Interventions: Provide education on elevated blood sugars and impact on wound healing Provide education on nutrition Treatment Activities: Patient referred to Primary Care Physician for further nutritional evaluation : 08/08/2020 Notes: Wound/Skin Impairment Nursing Diagnoses: Knowledge deficit related to ulceration/compromised skin integrity Goals: Patient/caregiver will verbalize understanding of skin care regimen Date Initiated: 08/08/2020 Date Inactivated: 08/22/2020 Target Resolution Date: 10/04/2020 Goal  Status: Met Ulcer/skin breakdown will heal within 14 weeks Date Initiated: 08/08/2020 Target Resolution Date: 11/29/2020 Goal Status: Active Interventions: Assess patient/caregiver ability to obtain necessary supplies Assess patient/caregiver ability to perform ulcer/skin care regimen upon admission and as needed Provide  education on ulcer and skin care Treatment Activities: Skin care regimen initiated : 08/08/2020 Topical wound management initiated : 08/08/2020 Notes: Electronic Signature(s) Signed: 08/22/2020 5:42:01 PM By: Deon Pilling Entered By: Deon Pilling on 08/22/2020 10:57:26 -------------------------------------------------------------------------------- Pain Assessment Details Patient Name: Date of Service: SURINA, STORTS 08/22/2020 10:45 A M Medical Record Number: [id number] Patient Account Number: [id number] Date of Birth/Sex: Treating RN: 11-16-1945 (75 y.o. Debby Bud Primary Care Mellie Buccellato: Other Clinician: [name] Referring Zalman Hull: Treating Delrick Dehart/Extender: Gabriel Rainwater in Treatment: 2 Active Problems Location of Pain Severity and Description of Pain Patient Has Paino No Site Locations Pain Management and Medication Current Pain Management: Electronic Signature(s) Signed: 08/22/2020 3:17:01 PM By: Sandre Kitty Signed: 08/22/2020 5:42:01 PM By: Deon Pilling Entered By: Sandre Kitty on 08/22/2020 10:55:31 -------------------------------------------------------------------------------- Patient/Caregiver Education Details Patient Name: Date of Service: [name] 1/27/2022andnbsp10:45 A M Medical Record Number: [id number] Patient Account Number: [id number] Date of Birth/Gender: Treating RN: 1945-09-16 (75 y.o. Debby Bud Primary Care Physician: [name] Other Clinician: Referring Physician: Treating Physician/Extender: Gabriel Rainwater in Treatment: 2 Education Assessment Education Provided To: Patient Education Topics Provided Elevated Blood Sugar/ Impact on Healing: Handouts: Elevated Blood Sugars: How Do They Affect Wound Healing Methods: Explain/Verbal Responses: Reinforcements needed Electronic Signature(s) Signed: 08/22/2020 5:42:01 PM By:  Deon Pilling Entered By: Deon Pilling on 08/22/2020 10:57:46 -------------------------------------------------------------------------------- Wound Assessment Details Patient Name: Date of Service: KAILIANA, GRANQUIST. 08/22/2020 10:45 A M Medical Record Number: [id number] Patient Account Number: [id number] Date of Birth/Sex: Treating RN: 02-07-1946 (75 y.o. [name], [name] Primary Care Rashel Okeefe: [name] Other Clinician: Referring Emagene Merfeld: Treating Roena Sassaman/Extender: Gabriel Rainwater in Treatment: 2 Wound Status Wound Number: 1 Primary Arterial Insufficiency Ulcer Etiology: Wound Location: Left, Lateral Malleolus Wound Open Wounding Event: Pressure Injury Status: Date Acquired: 03/27/2020 Comorbid Chronic Obstructive Pulmonary Disease (COPD), Hypertension, Weeks Of Treatment: 2 History: Type II Diabetes, Osteoarthritis, Seizure Disorder Clustered Wound: No Wound Measurements Length: (cm) 1.6 Width: (cm) 1.1 Depth: (cm) 0.1 Area: (cm) 1.382 Volume: (cm) 0.138 % Reduction in Area: -46.7% % Reduction in Volume: 26.6% Epithelialization: None Tunneling: No Undermining: No Wound Description Classification: Full Thickness Without Exposed Support Structures Wound Margin: Distinct, outline attached Exudate Amount: Medium Exudate Type: Serosanguineous Exudate Color: red, brown Foul Odor After Cleansing: No Slough/Fibrino Yes Wound Bed Granulation Amount: Small (1-33%) Exposed Structure Granulation Quality: Pink, Pale Fascia Exposed: No Necrotic Amount: Large (67-100%) Fat Layer (Subcutaneous Tissue) Exposed: Yes Necrotic Quality: Adherent Slough Tendon Exposed: No Muscle Exposed: No Joint Exposed: No Bone Exposed: No Treatment Notes Wound #1 (Malleolus) Wound Laterality: Left, Lateral Cleanser Soap and Water Discharge Instruction: May shower and wash wound with dial antibacterial soap and water prior to dressing  change. Peri-Wound Care Skin Prep Discharge Instruction: Use skin prep as directed Topical Primary Dressing Iodosorb Gel 10 (gm) Tube Discharge Instruction: or iodoflex.Apply to wound bed as instructed Secondary Dressing Bordered Gauze, 4x4 in Discharge Instruction: or bordered foam. Apply over primary dressing as directed. Secured With Compression Wrap Compression Stockings Environmental education officer) Signed: 08/22/2020 5:42:01 PM By: Deon Pilling Signed: 08/22/2020 5:45:53 PM By: Baruch Gouty RN, BSN Entered By: Baruch Gouty on 08/22/2020 11:10:27 -------------------------------------------------------------------------------- Wound Assessment Details Patient  Name: Date of Service: VAYLA, WILHELMI. 08/22/2020 10:45 A M Medical Record Number: [id number] Patient Account Number: [id number] Date of Birth/Sex: Treating RN: 05-Jul-1946 (75 y.o. [name], [name] Primary Care Matisse Roskelley: [name] Other Clinician: Referring Lucy Boardman: Treating Krysten Veronica/Extender: Gabriel Rainwater in Treatment: 2 Wound Status Wound Number: 2 Primary Trauma, Other Etiology: Wound Location: Right, Anterior Lower Leg Wound Healed - Epithelialized Wounding Event: Trauma Status: Date Acquired: 06/26/2020 Comorbid Chronic Obstructive Pulmonary Disease (COPD), Hypertension, Weeks Of Treatment: 2 History: Type II Diabetes, Osteoarthritis, Seizure Disorder Clustered Wound: No Wound Measurements Length: (cm) Width: (cm) Depth: (cm) Area: (cm) Volume: (cm) 0 % Reduction in Area: 100% 0 % Reduction in Volume: 100% 0 Epithelialization: Large (67-100%) 0 Tunneling: No 0 Undermining: No Wound Description Classification: Full Thickness Without Exposed Support Structures Wound Margin: Flat and Intact Exudate Amount: None Present Foul Odor After Cleansing: No Slough/Fibrino No Wound Bed Granulation Amount: None Present (0%) Exposed Structure Necrotic  Amount: None Present (0%) Fascia Exposed: No Fat Layer (Subcutaneous Tissue) Exposed: No Tendon Exposed: No Muscle Exposed: No Joint Exposed: No Bone Exposed: No Limited to Skin Breakdown Electronic Signature(s) Signed: 08/22/2020 5:42:01 PM By: Deon Pilling Signed: 08/22/2020 5:45:53 PM By: Baruch Gouty RN, BSN Entered By: Baruch Gouty on 08/22/2020 11:10:42 -------------------------------------------------------------------------------- Wound Assessment Details Patient Name: Date of Service: [name] 08/22/2020 10:45 A M Medical Record Number: [id number] Patient Account Number: [id number] Date of Birth/Sex: Treating RN: [birth year] (75 y.o. [name], [name] Primary Care Ismerai Bin: [name] Other Clinician: Referring Chelsa Stout: Treating Arsema Tusing/Extender: Gabriel Rainwater in Treatment: 2 Wound Status Wound Number: 3 Primary Diabetic Wound/Ulcer of the Lower Extremity Etiology: Wound Location: Right Knee Wound Open Wounding Event: Gradually Appeared Status: Date Acquired: 08/22/2020 Comorbid Chronic Obstructive Pulmonary Disease (COPD), Hypertension, Weeks Of Treatment: 0 History: Type II Diabetes, Osteoarthritis, Seizure Disorder Clustered Wound: No Wound Measurements Length: (cm) 1 Width: (cm) 0.7 Depth: (cm) 0.1 Area: (cm) 0.55 Volume: (cm) 0.055 % Reduction in Area: 0% % Reduction in Volume: 0% Epithelialization: None Tunneling: No Undermining: No Wound Description Classification: Grade 1 Wound Margin: Flat and Intact Exudate Amount: Small Exudate Type: Serosanguineous Exudate Color: red, brown Foul Odor After Cleansing: No Slough/Fibrino Yes Wound Bed Granulation Amount: None Present (0%) Exposed Structure Necrotic Amount: Large (67-100%) Fascia Exposed: No Necrotic Quality: Adherent Slough Fat Layer (Subcutaneous Tissue) Exposed: Yes Tendon Exposed: No Muscle Exposed: No Joint Exposed: No Bone  Exposed: No Treatment Notes Wound #3 (Knee) Wound Laterality: Right Cleanser Soap and Water Discharge Instruction: May shower and wash wound with dial antibacterial soap and water prior to dressing change. Peri-Wound Care Skin Prep Discharge Instruction: Use skin prep as directed Topical Primary Dressing Iodosorb Gel 10 (gm) Tube Discharge Instruction: or iodoflex.Apply to wound bed as instructed Secondary Dressing Bordered Gauze, 4x4 in Discharge Instruction: or bordered foam. May apply stretch net over dressing to hold in place. Apply over primary dressing as directed. Secured With Compression Wrap Compression Stockings Environmental education officer) Signed: 08/22/2020 5:42:01 PM By: Deon Pilling Signed: 08/22/2020 5:45:53 PM By: Baruch Gouty RN, BSN Entered By: Baruch Gouty on 08/22/2020 11:11:31 -------------------------------------------------------------------------------- Vitals Details Patient Name: Date of Service: [name] 08/22/2020 10:45 A M Medical Record Number: [id number] Patient Account Number: [id number] Date of Birth/Sex: Treating RN: 02/07/1946 (75 y.o. Debby Bud Primary Care Natashia Roseman: [name] Other Clinician: Referring Homar Weinkauf: Treating Jamerion Cabello/Extender: Gabriel Rainwater in Treatment: 2 Vital Signs Time Taken: 10:55 Temperature (F):  97.4 Pulse (bpm): 92 Respiratory Rate (breaths/min): 16 Blood Pressure (mmHg): 161/72 Reference Range: 80 - 120 mg / dl Electronic Signature(s) Signed: 08/22/2020 3:17:01 PM By: Sandre Kitty Entered By: Sandre Kitty on 08/22/2020 10:55:21

## 2020-08-22 NOTE — Progress Notes (Signed)
Crystal, Moore ([id number]) Visit Report for 08/22/2020 HPI Details Patient Name: Date of Service: Crystal Moore, Crystal Moore 08/22/2020 10:45 A M Medical Record Number: [id number] Patient Account Number: [id number] Date of Birth/Sex: Treating RN: 1946-07-19 (75 y.o. Crystal Moore Primary Care Provider: [name] Other Clinician: Referring Provider: Treating Provider/Extender: Gabriel Rainwater in Treatment: 2 History of Present Illness HPI Description: ADMISSION 08/08/2020 This is a 75 year old woman who is disabled largely because of a history of stroke and vascular dementia. She is a type II diabetic on insulin and although she follows with endocrinology we could not see her recent hemoglobin A1c. She has an area on her left lateral malleolus that has been present since September. Not exactly sure how this started however certainly not it is not progressed towards healing. She has a more recent area on the right upper mid tibia. This does not look as bad and may have been secondary to minor trauma although the history here is not really exact. The patient is minimally ambulatory. Recently moved here from [facility] about a year ago just settling in. She lives with her daughter however daughter works and her grandson brought her into the clinic today. They have been using AandE ointment on the wounds. There have been some suggestion about Neosporin until she got here but they have continued with the AandE ointment Past medical history includes; vascular dementia, history of CVA leaving her mostly aphasic, type 2 diabetes on insulin, COPD, seizure disorder. She stopped smoking about 2 years ago. ABIs in our clinic were 0.5 on the right and 0.37 on the left 1/20; major wound is over the left lateral malleolus I think this is an ischemic wound. We have had trouble making connections with the family with regards to the messages although they do have home  health going in but we have not yet been able to arrange her arterial test. Small wound on the right anterior upper tibia area looks actually somewhat better this week. 1/27; the patient went for her noninvasive ARTERIAL studies on the right her ABI was 0.5 with a TBI of 0.28 segmental pressures showed a significant drop in pressure from the proximal thigh to the distal thigh. Her great toe pressure was only 46. On the left her ABI was 0.57 on the left her TBI was 0.33. Again a similar drop in pressure from the level of the proximal thigh to the distal thigh. CT angiogram or angiogram was recommended. Electronic Signature(s) Signed: 08/22/2020 4:56:03 PM By: Linton Ham MD Entered By: Linton Ham on 08/22/2020 16:50:37 -------------------------------------------------------------------------------- Physical Exam Details Patient Name: Date of Service: Crystal Moore. 08/22/2020 10:45 A M Medical Record Number: [id number] Patient Account Number: [id number] Date of Birth/Sex: Treating RN: 17-Mar-1946 (75 y.o. Crystal Moore Primary Care Provider: [name] Other Clinician: Referring Provider: Treating Provider/Extender: Gabriel Rainwater in Treatment: 2 Constitutional Patient is hypertensive.. Pulse regular and within target range for patient.Marland Kitchen Respirations regular, non-labored and within target range.. Temperature is normal and within the target range for the patient.Marland Kitchen Appears in no distress. Cardiovascular I could not feel popliteal pulses but femoral pulses were palpable. Pedal pulses are absent in both feet. Notes Wound exam; small superficial wound on the right anterior tibia has closed however she continues to have a major area on the left lateral malleolus. I was also concerned about a blister on the lateral aspect of her right great toe that appears painful. Electronic Signature(s) Signed: 08/22/2020 4:56:03  PM By: Linton Ham  MD Entered By: Linton Ham on 08/22/2020 16:51:56 -------------------------------------------------------------------------------- Physician Orders Details Patient Name: Date of Service: Crystal Moore. 08/22/2020 10:45 A M Medical Record Number: [id number] Patient Account Number: [id number] Date of Birth/Sex: Treating RN: June 09, 1946 (75 y.o. [name], [name] Primary Care Provider: [name] Other Clinician: Referring Provider: Treating Provider/Extender: Gabriel Rainwater in Treatment: 2 Verbal / Phone Orders: No Diagnosis Coding ICD-10 Coding Code Description E11.622 Type 2 diabetes mellitus with other skin ulcer I73.9 Peripheral vascular disease, unspecified E11.51 Type 2 diabetes mellitus with diabetic peripheral angiopathy without gangrene L97.328 Non-pressure chronic ulcer of left ankle with other specified severity L97.511 Non-pressure chronic ulcer of other part of right foot limited to breakdown of skin Follow-up Appointments Return Appointment in 1 week. Bathing/ Shower/ Hygiene May shower with protection but do not get wound dressing(s) wet. - with not dressing changes only. May shower and wash wound with soap and water. - may wash with soap and water with dressing change. Edema Control - Lymphedema / SCD / Other Elevate legs to the level of the heart or above for 30 minutes daily and/or when sitting, a frequency of: - throughout the day. Avoid standing for long periods of time. Off-Loading Other: - ensure no pressure to left ankle wound. Additional Orders / Instructions Other: - Family to discuss to go forward or not with arterial procedure- angiogram. Call wound center if any questions and can speak with MD. Will return next week to wound center to follow up with discussion of going forward or not with procedure. Non Wound Condition Protect area with: - pad with gauze and conform to right great toe blister for protection.  **Home Health to apply.** Home Health No change in wound care orders this week; continue Home Health for wound care. May utilize formulary equivalent dressing for wound treatment orders unless otherwise specified. - [facility] dressing changes two times a week. Wound Treatment Wound #1 - Malleolus Wound Laterality: Left, Lateral Cleanser: Soap and Water [facility]) Every Other Day/30 Days Discharge Instructions: May shower and wash wound with dial antibacterial soap and water prior to dressing change. Peri-Wound Care: Skin Prep [facility]) Every Other Day/30 Days Discharge Instructions: Use skin prep as directed Prim Dressing: Iodosorb Gel 10 (gm) Tube [facility]) Every Other Day/30 Days ary Discharge Instructions: or iodoflex.Apply to wound bed as instructed Secondary Dressing: Bordered Gauze, 4x4 in [facility]) Every Other Day/30 Days Discharge Instructions: or bordered foam. Apply over primary dressing as directed. Wound #3 - Knee Wound Laterality: Right Cleanser: Soap and Water [facility]) Every Other Day/30 Days Discharge Instructions: May shower and wash wound with dial antibacterial soap and water prior to dressing change. Peri-Wound Care: Skin Prep [facility]) Every Other Day/30 Days Discharge Instructions: Use skin prep as directed Prim Dressing: Iodosorb Gel 10 (gm) Tube [facility]) Every Other Day/30 Days ary Discharge Instructions: or iodoflex.Apply to wound bed as instructed Secondary Dressing: Bordered Gauze, 4x4 in [facility]) Every Other Day/30 Days Discharge Instructions: or bordered foam. May apply stretch net over dressing to hold in place. Apply over primary dressing as directed. Electronic Signature(s) Signed: 08/22/2020 4:56:03 PM By: Linton Ham MD Signed: 08/22/2020 5:42:01 PM By: Deon Pilling Entered By: Deon Pilling on 08/22/2020 11:33:49 -------------------------------------------------------------------------------- Problem List  Details Patient Name: Date of Service: Crystal Moore. 08/22/2020 10:45 A M Medical Record Number: [id number] Patient Account Number: [id number] Date of Birth/Sex: Treating RN: 1946-05-13 (75 y.o.  [name], [name] Primary Care Provider: [name] Other Clinician: Referring Provider: Treating Provider/Extender: Gabriel Rainwater in Treatment: 2 Active Problems ICD-10 Encounter Code Description Active Date MDM Diagnosis E11.622 Type 2 diabetes mellitus with other skin ulcer 08/08/2020 No Yes I73.9 Peripheral vascular disease, unspecified 08/08/2020 No Yes E11.51 Type 2 diabetes mellitus with diabetic peripheral angiopathy without gangrene 08/08/2020 No Yes L97.328 Non-pressure chronic ulcer of left ankle with other specified severity 08/08/2020 No Yes L97.511 Non-pressure chronic ulcer of other part of right foot limited to breakdown of 08/08/2020 No Yes skin Inactive Problems Resolved Problems Electronic Signature(s) Signed: 08/22/2020 4:56:03 PM By: Linton Ham MD Entered By: Linton Ham on 08/22/2020 16:48:36 -------------------------------------------------------------------------------- Progress Note Details Patient Name: Date of Service: [name] 08/22/2020 10:45 A M Medical Record Number: [id number] Patient Account Number: [id number] Date of Birth/Sex: Treating RN: 09-17-1945 (75 y.o. Crystal Moore Primary Care Provider: [name] Other Clinician: Referring Provider: Treating Provider/Extender: Gabriel Rainwater in Treatment: 2 Subjective History of Present Illness (HPI) ADMISSION 08/08/2020 This is a 75 year old woman who is disabled largely because of a history of stroke and vascular dementia. She is a type II diabetic on insulin and although she follows with endocrinology we could not see her recent hemoglobin A1c. She has an area on her left lateral malleolus that has been  present since September. Not exactly sure how this started however certainly not it is not progressed towards healing. She has a more recent area on the right upper mid tibia. This does not look as bad and may have been secondary to minor trauma although the history here is not really exact. The patient is minimally ambulatory. Recently moved here from [facility] about a year ago just settling in. She lives with her daughter however daughter works and her grandson brought her into the clinic today. They have been using AandE ointment on the wounds. There have been some suggestion about Neosporin until she got here but they have continued with the AandE ointment Past medical history includes; vascular dementia, history of CVA leaving her mostly aphasic, type 2 diabetes on insulin, COPD, seizure disorder. She stopped smoking about 2 years ago. ABIs in our clinic were 0.5 on the right and 0.37 on the left 1/20; major wound is over the left lateral malleolus I think this is an ischemic wound. We have had trouble making connections with the family with regards to the messages although they do have home health going in but we have not yet been able to arrange her arterial test. Small wound on the right anterior upper tibia area looks actually somewhat better this week. 1/27; the patient went for her noninvasive ARTERIAL studies on the right her ABI was 0.5 with a TBI of 0.28 segmental pressures showed a significant drop in pressure from the proximal thigh to the distal thigh. Her great toe pressure was only 46. On the left her ABI was 0.57 on the left her TBI was 0.33. Again a similar drop in pressure from the level of the proximal thigh to the distal thigh. CT angiogram or angiogram was recommended. Objective Constitutional Patient is hypertensive.. Pulse regular and within target range for patient.Marland Kitchen Respirations regular, non-labored and within target range.. Temperature is normal and within the  target range for the patient.Marland Kitchen Appears in no distress. Vitals Time Taken: 10:55 AM, Temperature: 97.4 F, Pulse: 92 bpm, Respiratory Rate: 16 breaths/min, Blood Pressure: 161/72 mmHg. Cardiovascular I could not feel popliteal pulses  but femoral pulses were palpable. Pedal pulses are absent in both feet. General Notes: Wound exam; small superficial wound on the right anterior tibia has closed however she continues to have a major area on the left lateral malleolus. I was also concerned about a blister on the lateral aspect of her right great toe that appears painful. Integumentary (Hair, Skin) Wound #1 status is Open. Original cause of wound was Pressure Injury. The wound is located on the Left,Lateral Malleolus. The wound measures 1.6cm length x 1.1cm width x 0.1cm depth; 1.382cm^2 area and 0.138cm^3 volume. There is Fat Layer (Subcutaneous Tissue) exposed. There is no tunneling or undermining noted. There is a medium amount of serosanguineous drainage noted. The wound margin is distinct with the outline attached to the wound base. There is small (1-33%) pink, pale granulation within the wound bed. There is a large (67-100%) amount of necrotic tissue within the wound bed including Adherent Slough. Wound #2 status is Healed - Epithelialized. Original cause of wound was Trauma. The wound is located on the Right,Anterior Lower Leg. The wound measures 0cm length x 0cm width x 0cm depth; 0cm^2 area and 0cm^3 volume. The wound is limited to skin breakdown. There is no tunneling or undermining noted. There is a none present amount of drainage noted. The wound margin is flat and intact. There is no granulation within the wound bed. There is no necrotic tissue within the wound bed. Wound #3 status is Open. Original cause of wound was Gradually Appeared. The wound is located on the Right Knee. The wound measures 1cm length x 0.7cm width x 0.1cm depth; 0.55cm^2 area and 0.055cm^3 volume. There is Fat Layer  (Subcutaneous Tissue) exposed. There is no tunneling or undermining noted. There is a small amount of serosanguineous drainage noted. The wound margin is flat and intact. There is no granulation within the wound bed. There is a large (67-100%) amount of necrotic tissue within the wound bed including Adherent Slough. Assessment Active Problems ICD-10 Type 2 diabetes mellitus with other skin ulcer Peripheral vascular disease, unspecified Type 2 diabetes mellitus with diabetic peripheral angiopathy without gangrene Non-pressure chronic ulcer of left ankle with other specified severity Non-pressure chronic ulcer of other part of right foot limited to breakdown of skin Plan Follow-up Appointments: Return Appointment in 1 week. Bathing/ Shower/ Hygiene: May shower with protection but do not get wound dressing(s) wet. - with not dressing changes only. May shower and wash wound with soap and water. - may wash with soap and water with dressing change. Edema Control - Lymphedema / SCD / Other: Elevate legs to the level of the heart or above for 30 minutes daily and/or when sitting, a frequency of: - throughout the day. Avoid standing for long periods of time. Off-Loading: Other: - ensure no pressure to left ankle wound. Additional Orders / Instructions: Other: - Family to discuss to go forward or not with arterial procedure- angiogram. Call wound center if any questions and can speak with MD. Will return next week to wound center to follow up with discussion of going forward or not with procedure. Non Wound Condition: Protect area with: - pad with gauze and conform to right great toe blister for protection. **Home Health to apply.** Home Health: No change in wound care orders this week; continue Home Health for wound care. May utilize formulary equivalent dressing for wound treatment orders unless otherwise specified. - [facility] home [facility] dressing changes two times a week. WOUND #1: -  Malleolus Wound Laterality: Left, Lateral  Cleanser: Soap and Water [facility]) Every Other Day/30 Days Discharge Instructions: May shower and wash wound with dial antibacterial soap and water prior to dressing change. Peri-Wound Care: Skin Prep [facility]) Every Other Day/30 Days Discharge Instructions: Use skin prep as directed Prim Dressing: Iodosorb Gel 10 (gm) Tube [facility]) Every Other Day/30 Days ary Discharge Instructions: or iodoflex.Apply to wound bed as instructed Secondary Dressing: Bordered Gauze, 4x4 in [facility]) Every Other Day/30 Days Discharge Instructions: or bordered foam. Apply over primary dressing as directed. WOUND #3: - Knee Wound Laterality: Right Cleanser: Soap and Water [facility]) Every Other Day/30 Days Discharge Instructions: May shower and wash wound with dial antibacterial soap and water prior to dressing change. Peri-Wound Care: Skin Prep [facility]) Every Other Day/30 Days Discharge Instructions: Use skin prep as directed Prim Dressing: Iodosorb Gel 10 (gm) Tube [facility]) Every Other Day/30 Days ary Discharge Instructions: or iodoflex.Apply to wound bed as instructed Secondary Dressing: Bordered Gauze, 4x4 in [facility]) Every Other Day/30 Days Discharge Instructions: or bordered foam. May apply stretch net over dressing to hold in place. Apply over primary dressing as directed. #1 I am concerned about the patient's arterial status bilaterally. Her noninvasive studies certainly suggest significant PAD 2. She has significant dementia. She certainly could not make complicated medical decision herself, her POA is apparently her daughter. It is her grandson who brings her to clinic 3. I asked him to talk about going forward with an arterial consult to interventional radiology. I be glad to talk to them on the phone if necessary to help them understand what I am talking about. She will have to follow directions to get an angiogram done.  Nevertheless I think that is the root this is going to have to go. I suspect she is going to have deterioration in the right great toe perhaps by the next time we see her in the pain of claudication is really difficult to endure. Electronic Signature(s) Signed: 08/22/2020 4:56:03 PM By: Linton Ham MD Entered By: Linton Ham on 08/22/2020 16:55:09 -------------------------------------------------------------------------------- SuperBill Details Patient Name: Date of Service: Crystal Moore 08/22/2020 Medical Record Number: [id number] Patient Account Number: [id number] Date of Birth/Sex: Treating RN: Sep 02, 1945 (75 y.o. [name], [name] Primary Care Provider: [name] Other Clinician: Referring Provider: Treating Provider/Extender: Gabriel Rainwater in Treatment: 2 Diagnosis Coding ICD-10 Codes Code Description E11.622 Type 2 diabetes mellitus with other skin ulcer I73.9 Peripheral vascular disease, unspecified E11.51 Type 2 diabetes mellitus with diabetic peripheral angiopathy without gangrene L97.328 Non-pressure chronic ulcer of left ankle with other specified severity L97.511 Non-pressure chronic ulcer of other part of right foot limited to breakdown of skin Facility Procedures CPT4 Code: [id number] Description: 02542 - WOUND CARE VISIT-LEV 5 EST PT Modifier: Quantity: 1 Electronic Signature(s) Signed: 08/22/2020 4:56:03 PM By: Linton Ham MD Signed: 08/22/2020 5:42:01 PM By: Deon Pilling Entered By: Deon Pilling on 08/22/2020 13:05:04

## 2020-08-26 ENCOUNTER — Inpatient Hospital Stay (HOSPITAL_COMMUNITY)
Admission: EM | Admit: 2020-08-26 | Discharge: 2020-09-06 | DRG: 854 | Disposition: A | Discharge status: Home Health | Payer: Medicare Other | Attending: Internal Medicine | Admitting: Internal Medicine

## 2020-08-26 ENCOUNTER — Encounter (HOSPITAL_COMMUNITY): Payer: Self-pay

## 2020-08-26 ENCOUNTER — Emergency Department (HOSPITAL_COMMUNITY): Payer: Medicare Other

## 2020-08-26 ENCOUNTER — Other Ambulatory Visit: Payer: Self-pay

## 2020-08-26 DIAGNOSIS — Z79899 Other long term (current) drug therapy: Secondary | ICD-10-CM

## 2020-08-26 DIAGNOSIS — E1159 Type 2 diabetes mellitus with other circulatory complications: Secondary | ICD-10-CM | POA: Diagnosis present

## 2020-08-26 DIAGNOSIS — E1169 Type 2 diabetes mellitus with other specified complication: Secondary | ICD-10-CM | POA: Diagnosis present

## 2020-08-26 DIAGNOSIS — Z8616 Personal history of COVID-19: Secondary | ICD-10-CM

## 2020-08-26 DIAGNOSIS — Z794 Long term (current) use of insulin: Secondary | ICD-10-CM

## 2020-08-26 DIAGNOSIS — E872 Acidosis: Secondary | ICD-10-CM | POA: Diagnosis not present

## 2020-08-26 DIAGNOSIS — Z7902 Long term (current) use of antithrombotics/antiplatelets: Secondary | ICD-10-CM

## 2020-08-26 DIAGNOSIS — J849 Interstitial pulmonary disease, unspecified: Secondary | ICD-10-CM | POA: Diagnosis present

## 2020-08-26 DIAGNOSIS — F0390 Unspecified dementia without behavioral disturbance: Secondary | ICD-10-CM | POA: Diagnosis present

## 2020-08-26 DIAGNOSIS — M869 Osteomyelitis, unspecified: Secondary | ICD-10-CM | POA: Diagnosis not present

## 2020-08-26 DIAGNOSIS — I70263 Atherosclerosis of native arteries of extremities with gangrene, bilateral legs: Secondary | ICD-10-CM | POA: Diagnosis present

## 2020-08-26 DIAGNOSIS — I69354 Hemiplegia and hemiparesis following cerebral infarction affecting left non-dominant side: Secondary | ICD-10-CM

## 2020-08-26 DIAGNOSIS — D509 Iron deficiency anemia, unspecified: Secondary | ICD-10-CM | POA: Diagnosis present

## 2020-08-26 DIAGNOSIS — F419 Anxiety disorder, unspecified: Secondary | ICD-10-CM | POA: Diagnosis present

## 2020-08-26 DIAGNOSIS — R2681 Unsteadiness on feet: Secondary | ICD-10-CM | POA: Diagnosis not present

## 2020-08-26 DIAGNOSIS — L97309 Non-pressure chronic ulcer of unspecified ankle with unspecified severity: Secondary | ICD-10-CM | POA: Diagnosis present

## 2020-08-26 DIAGNOSIS — L97328 Non-pressure chronic ulcer of left ankle with other specified severity: Secondary | ICD-10-CM | POA: Diagnosis not present

## 2020-08-26 DIAGNOSIS — L03115 Cellulitis of right lower limb: Secondary | ICD-10-CM | POA: Diagnosis not present

## 2020-08-26 DIAGNOSIS — Z833 Family history of diabetes mellitus: Secondary | ICD-10-CM

## 2020-08-26 DIAGNOSIS — F17210 Nicotine dependence, cigarettes, uncomplicated: Secondary | ICD-10-CM | POA: Diagnosis present

## 2020-08-26 DIAGNOSIS — L97511 Non-pressure chronic ulcer of other part of right foot limited to breakdown of skin: Secondary | ICD-10-CM | POA: Diagnosis not present

## 2020-08-26 DIAGNOSIS — E1140 Type 2 diabetes mellitus with diabetic neuropathy, unspecified: Secondary | ICD-10-CM | POA: Diagnosis not present

## 2020-08-26 DIAGNOSIS — I739 Peripheral vascular disease, unspecified: Secondary | ICD-10-CM

## 2020-08-26 DIAGNOSIS — F32A Depression, unspecified: Secondary | ICD-10-CM | POA: Diagnosis present

## 2020-08-26 DIAGNOSIS — J449 Chronic obstructive pulmonary disease, unspecified: Secondary | ICD-10-CM | POA: Diagnosis present

## 2020-08-26 DIAGNOSIS — E11622 Type 2 diabetes mellitus with other skin ulcer: Secondary | ICD-10-CM | POA: Diagnosis not present

## 2020-08-26 DIAGNOSIS — I10 Essential (primary) hypertension: Secondary | ICD-10-CM | POA: Diagnosis not present

## 2020-08-26 DIAGNOSIS — J8410 Pulmonary fibrosis, unspecified: Secondary | ICD-10-CM | POA: Diagnosis not present

## 2020-08-26 DIAGNOSIS — E119 Type 2 diabetes mellitus without complications: Secondary | ICD-10-CM | POA: Diagnosis present

## 2020-08-26 DIAGNOSIS — J811 Chronic pulmonary edema: Secondary | ICD-10-CM | POA: Diagnosis not present

## 2020-08-26 DIAGNOSIS — L03031 Cellulitis of right toe: Secondary | ICD-10-CM | POA: Diagnosis not present

## 2020-08-26 DIAGNOSIS — Z7984 Long term (current) use of oral hypoglycemic drugs: Secondary | ICD-10-CM

## 2020-08-26 DIAGNOSIS — L0390 Cellulitis, unspecified: Secondary | ICD-10-CM | POA: Diagnosis not present

## 2020-08-26 DIAGNOSIS — Z9981 Dependence on supplemental oxygen: Secondary | ICD-10-CM | POA: Diagnosis not present

## 2020-08-26 DIAGNOSIS — L97329 Non-pressure chronic ulcer of left ankle with unspecified severity: Secondary | ICD-10-CM | POA: Diagnosis not present

## 2020-08-26 DIAGNOSIS — D638 Anemia in other chronic diseases classified elsewhere: Secondary | ICD-10-CM | POA: Diagnosis present

## 2020-08-26 DIAGNOSIS — E785 Hyperlipidemia, unspecified: Secondary | ICD-10-CM | POA: Diagnosis not present

## 2020-08-26 DIAGNOSIS — R000 Tachycardia, unspecified: Secondary | ICD-10-CM | POA: Diagnosis not present

## 2020-08-26 DIAGNOSIS — R6520 Severe sepsis without septic shock: Secondary | ICD-10-CM | POA: Diagnosis present

## 2020-08-26 DIAGNOSIS — M7731 Calcaneal spur, right foot: Secondary | ICD-10-CM | POA: Diagnosis not present

## 2020-08-26 DIAGNOSIS — E1152 Type 2 diabetes mellitus with diabetic peripheral angiopathy with gangrene: Secondary | ICD-10-CM | POA: Diagnosis not present

## 2020-08-26 DIAGNOSIS — R531 Weakness: Secondary | ICD-10-CM | POA: Diagnosis not present

## 2020-08-26 DIAGNOSIS — I70203 Unspecified atherosclerosis of native arteries of extremities, bilateral legs: Secondary | ICD-10-CM | POA: Diagnosis not present

## 2020-08-26 DIAGNOSIS — L089 Local infection of the skin and subcutaneous tissue, unspecified: Secondary | ICD-10-CM | POA: Diagnosis not present

## 2020-08-26 DIAGNOSIS — A419 Sepsis, unspecified organism: Secondary | ICD-10-CM | POA: Diagnosis not present

## 2020-08-26 DIAGNOSIS — G4733 Obstructive sleep apnea (adult) (pediatric): Secondary | ICD-10-CM | POA: Diagnosis present

## 2020-08-26 DIAGNOSIS — E1165 Type 2 diabetes mellitus with hyperglycemia: Secondary | ICD-10-CM | POA: Diagnosis not present

## 2020-08-26 DIAGNOSIS — L97321 Non-pressure chronic ulcer of left ankle limited to breakdown of skin: Secondary | ICD-10-CM | POA: Diagnosis not present

## 2020-08-26 DIAGNOSIS — I700 Atherosclerosis of aorta: Secondary | ICD-10-CM | POA: Diagnosis not present

## 2020-08-26 DIAGNOSIS — J439 Emphysema, unspecified: Secondary | ICD-10-CM | POA: Diagnosis not present

## 2020-08-26 DIAGNOSIS — M24562 Contracture, left knee: Secondary | ICD-10-CM | POA: Diagnosis present

## 2020-08-26 DIAGNOSIS — Z7982 Long term (current) use of aspirin: Secondary | ICD-10-CM

## 2020-08-26 DIAGNOSIS — E1151 Type 2 diabetes mellitus with diabetic peripheral angiopathy without gangrene: Secondary | ICD-10-CM | POA: Diagnosis not present

## 2020-08-26 LAB — SARS CORONAVIRUS 2 BY RT PCR (HOSPITAL ORDER, PERFORMED IN ~~LOC~~ HOSPITAL LAB): SARS Coronavirus 2: NEGATIVE

## 2020-08-26 LAB — BASIC METABOLIC PANEL
Anion gap: 12 (ref 5–15)
BUN: 18 mg/dL (ref 8–23)
CO2: 24 mmol/L (ref 22–32)
Calcium: 9.3 mg/dL (ref 8.9–10.3)
Chloride: 100 mmol/L (ref 98–111)
Creatinine, Ser: 0.87 mg/dL (ref 0.44–1.00)
GFR, Estimated: >60 mL/min (ref >60)
Glucose, Bld: 334 mg/dL — ABNORMAL HIGH (ref 70–99)
Potassium: 4 mmol/L (ref 3.5–5.1)
Sodium: 136 mmol/L (ref 135–145)

## 2020-08-26 LAB — CBC WITH DIFFERENTIAL/PLATELET
Abs Immature Granulocytes: 0.04 10*3/uL (ref 0.00–0.07)
Basophils Absolute: 0.1 10*3/uL (ref 0.0–0.1)
Basophils Relative: 1 %
Eosinophils Absolute: 0.1 10*3/uL (ref 0.0–0.5)
Eosinophils Relative: 1 %
HCT: 30.8 % — ABNORMAL LOW (ref 36.0–46.0)
Hemoglobin: 9.7 g/dL — ABNORMAL LOW (ref 12.0–15.0)
Immature Granulocytes: 0 %
Lymphocytes Relative: 13 %
Lymphs Abs: 1.9 10*3/uL (ref 0.7–4.0)
MCH: 24.7 pg — ABNORMAL LOW (ref 26.0–34.0)
MCHC: 31.5 g/dL (ref 30.0–36.0)
MCV: 78.6 fL — ABNORMAL LOW (ref 80.0–100.0)
Monocytes Absolute: 0.8 10*3/uL (ref 0.1–1.0)
Monocytes Relative: 6 %
Neutro Abs: 12.2 10*3/uL — ABNORMAL HIGH (ref 1.7–7.7)
Neutrophils Relative %: 79 %
Platelets: 389 10*3/uL (ref 150–400)
RBC: 3.92 MIL/uL (ref 3.87–5.11)
RDW: 16.1 % — ABNORMAL HIGH (ref 11.5–15.5)
WBC: 15.1 10*3/uL — ABNORMAL HIGH (ref 4.0–10.5)
nRBC: 0 % (ref 0.0–0.2)

## 2020-08-26 LAB — TROPONIN I (HIGH SENSITIVITY): Troponin I (High Sensitivity): 12 ng/L (ref <18)

## 2020-08-26 LAB — BRAIN NATRIURETIC PEPTIDE: B Natriuretic Peptide: 52 pg/mL (ref 0.0–100.0)

## 2020-08-26 MED ORDER — MORPHINE SULFATE (PF) 2 MG/ML IV SOLN
2.0000 mg | INTRAVENOUS | Status: DC | PRN
  Administered 2020-08-27 – 2020-08-29 (×4): 2 mg via INTRAVENOUS
  Filled 2020-08-26 (×4): qty 1

## 2020-08-26 MED ORDER — ASPIRIN EC 81 MG PO TBEC
81.0000 mg | DELAYED_RELEASE_TABLET | Freq: Every day | ORAL | Status: DC
  Administered 2020-08-27 – 2020-08-29 (×3): 81 mg via ORAL
  Filled 2020-08-26 (×3): qty 1

## 2020-08-26 MED ORDER — INSULIN GLARGINE 100 UNIT/ML ~~LOC~~ SOLN
25.0000 [IU] | Freq: Every day | SUBCUTANEOUS | Status: DC
  Administered 2020-08-27 – 2020-09-03 (×7): 25 [IU] via SUBCUTANEOUS
  Filled 2020-08-26 (×9): qty 0.25

## 2020-08-26 MED ORDER — LINAGLIPTIN 5 MG PO TABS
5.0000 mg | ORAL_TABLET | Freq: Every day | ORAL | Status: DC
  Administered 2020-08-27 – 2020-09-06 (×10): 5 mg via ORAL
  Filled 2020-08-26 (×10): qty 1

## 2020-08-26 MED ORDER — CLOPIDOGREL BISULFATE 75 MG PO TABS
75.0000 mg | ORAL_TABLET | Freq: Every day | ORAL | Status: DC
  Administered 2020-08-27 – 2020-08-29 (×3): 75 mg via ORAL
  Administered 2020-08-30: 300 mg via ORAL
  Filled 2020-08-26 (×3): qty 1

## 2020-08-26 MED ORDER — INSULIN DEGLUDEC 100 UNIT/ML ~~LOC~~ SOLN: 25.0000 [IU] | Freq: Every day | SUBCUTANEOUS | Status: DC

## 2020-08-26 MED ORDER — ALBUTEROL SULFATE HFA 108 (90 BASE) MCG/ACT IN AERS
1.0000 | INHALATION_SPRAY | Freq: Four times a day (QID) | RESPIRATORY_TRACT | Status: DC | PRN
  Filled 2020-08-26: qty 6.7

## 2020-08-26 MED ORDER — VANCOMYCIN HCL IN DEXTROSE 1-5 GM/200ML-% IV SOLN
1000.0000 mg | Freq: Once | INTRAVENOUS | Status: AC
  Administered 2020-08-26: 1000 mg via INTRAVENOUS
  Filled 2020-08-26: qty 200

## 2020-08-26 MED ORDER — SODIUM CHLORIDE 0.9 % IV SOLN
2.0000 g | INTRAVENOUS | Status: AC
  Administered 2020-08-26: 2 g via INTRAVENOUS
  Filled 2020-08-26: qty 2

## 2020-08-26 MED ORDER — SODIUM CHLORIDE 0.9 % IV SOLN
2.0000 g | Freq: Two times a day (BID) | INTRAVENOUS | Status: DC
  Administered 2020-08-27 – 2020-08-29 (×4): 2 g via INTRAVENOUS
  Filled 2020-08-26 (×4): qty 2

## 2020-08-26 MED ORDER — AMLODIPINE BESYLATE 5 MG PO TABS
5.0000 mg | ORAL_TABLET | Freq: Every day | ORAL | Status: DC
  Administered 2020-08-27 – 2020-09-06 (×10): 5 mg via ORAL
  Filled 2020-08-26 (×10): qty 1

## 2020-08-26 MED ORDER — ESCITALOPRAM OXALATE 10 MG PO TABS
10.0000 mg | ORAL_TABLET | Freq: Every day | ORAL | Status: DC
  Administered 2020-08-28 – 2020-09-06 (×9): 10 mg via ORAL
  Filled 2020-08-26 (×10): qty 1

## 2020-08-26 MED ORDER — ATORVASTATIN CALCIUM 80 MG PO TABS
80.0000 mg | ORAL_TABLET | Freq: Every day | ORAL | Status: DC
  Administered 2020-08-27 – 2020-09-06 (×10): 80 mg via ORAL
  Filled 2020-08-26 (×6): qty 1
  Filled 2020-08-26 (×2): qty 2
  Filled 2020-08-26: qty 1
  Filled 2020-08-26: qty 2

## 2020-08-26 MED ORDER — ALBUTEROL SULFATE (2.5 MG/3ML) 0.083% IN NEBU: 2.5000 mg | INHALATION_SOLUTION | RESPIRATORY_TRACT | Status: DC | PRN

## 2020-08-26 MED ORDER — VANCOMYCIN HCL IN DEXTROSE 1-5 GM/200ML-% IV SOLN
1000.0000 mg | INTRAVENOUS | Status: DC
  Administered 2020-08-27 – 2020-08-28 (×2): 1000 mg via INTRAVENOUS
  Filled 2020-08-26 (×2): qty 200

## 2020-08-26 MED ORDER — ENSURE ENLIVE PO LIQD
237.0000 mL | Freq: Every day | ORAL | Status: DC
  Administered 2020-08-27 – 2020-09-06 (×8): 237 mL via ORAL
  Filled 2020-08-26 (×2): qty 237

## 2020-08-26 NOTE — Progress Notes (Signed)
Pharmacy Antibiotic Note  Crystal Moore is a 75 y.o. female admitted on 08/26/2020 with right foot cellulitis and possible gangrene of right big toe and second toe.  PMH significant for DM, PVD, CVA, HTN, vascular ulcer of left ankle and tobacco use.  Patient received Vancomycin 1gm IV x 1 in the ED.  Pharmacy has been consulted for Vancomycin and Cefepime dosing.  Plan: - Cefepime 2gm IV q12h - Vancomycin 1000 mg IV Q 24 hrs. Goal AUC 400-550. Expected AUC: 537.7  SCr used: 0.87 - Monitor Vancomycin levels as needed - Follow renal function - Follow culture results and sensitivities   Height: 5\' 2"  (157.5 cm) Weight: 61.2 kg (135 lb) IBW/kg (Calculated) : 50.1  Temp (24hrs), Avg:99.5 F (37.5 C), Min:98.8 F (37.1 C), Max:100.2 F (37.9 C)  Recent Labs  Lab 08/26/20 2034  WBC 15.1*  CREATININE 0.87    Estimated Creatinine Clearance: 49.5 mL/min (by C-G formula based on SCr of 0.87 mg/dL).    No Known Allergies  Antimicrobials this admission: 1/31 Vanc >>   1/31 Cefepime >>    Dose adjustments this admission:    Microbiology results: 1/31 BCx:    Thank you for allowing pharmacy to be a part of this patient's care.  Everette Rank, PharmD 08/26/2020 11:21 PM

## 2020-08-26 NOTE — ED Provider Notes (Signed)
[facility] DEPT Provider Note   CSN: [id number] Arrival date & time: 08/26/20  1955     History No chief complaint on file.   Crystal Moore is a 75 y.o. female.  Patient with history of diabetes and dementia peripheral vascular disease, presents with worsening lower extremity wounds.  She has been recently seeing wound care and has had ultrasounds of her legs done which showed findings concerning for severe peripheral vascular disease.  Her ABI numbers were in the low range.  She was scheduled to have outpatient evaluation with vascular surgery next week, however family noticed worsening of the wound on her right foot over the course the last 2 days and bring the patient to the ER for evaluation.        Past Medical History:  Diagnosis Date  . Arthritis   . COPD (chronic obstructive pulmonary disease) ([place])   . COVID-19 virus infection 07/2020  . CVA (cerebral vascular accident) ([place])   . Diabetes ([place])   . Diabetes mellitus without complication ([place])   . Hemiparesis affecting left side as late effect of cerebrovascular accident (CVA) ([place])   . Pyelonephritis   . Seizures ([place])   . Stroke ([place])   . Tobacco use   . Tobacco use disorder   . UTI (urinary tract infection)     Patient Active Problem List   Diagnosis Date Noted  . CVA (cerebral vascular accident) ([place])   . COVID-19 virus infection 07/2020  . Ankle ulcer ([place]) 06/28/2020  . AKI (acute kidney injury) ([place]) 01/15/2019  . Cerebral thrombosis with cerebral infarction 01/15/2019  . TIA (transient ischemic attack) 01/13/2019  . Type 2 diabetes mellitus with vascular disease ([place]) 01/13/2019  . Hypokalemia 12/18/2018  . Acute CVA (cerebrovascular accident) ([place]) 12/17/2018  . Malnutrition of moderate degree 05/31/2018  . Hematochezia 05/29/2018  . Ischemic colitis ([place]) 05/29/2018  . Acute encephalopathy 05/29/2018  . Type 2 diabetes mellitus ([place]) 05/29/2018  . Hypertension  05/29/2018  . Depression 05/29/2018  . COPD (chronic obstructive pulmonary disease) ([place]) 05/29/2018  . Sepsis ([place]) 05/28/2018  . Diabetes ([place]) 03/16/2018  . ILD (interstitial lung disease) ([place]) 02/18/2018  . Chronic respiratory failure with hypoxia ([place]) 02/18/2018  . COPD (chronic obstructive pulmonary disease) ([place]) 01/12/2018  . Tobacco abuse 01/12/2018    Past Surgical History:  Procedure Laterality Date  . TUBAL LIGATION       OB History   No obstetric history on file.     Family History  Problem Relation Age of Onset  . Diabetes Mother   . Diabetes Sister   . Diabetes Brother     Social History   Tobacco Use  . Smoking status: Current Every Day Smoker    Packs/day: 1.00    Years: 40.00    Pack years: 40.00    Types: Cigarettes  . Smokeless tobacco: Never Used  Vaping Use  . Vaping Use: Never used  Substance Use Topics  . Alcohol use: Never  . Drug use: Never    Home Medications Prior to Admission medications   Medication Sig Start Date End Date Taking? Authorizing Provider  albuterol (PROVENTIL) (2.5 MG/3ML) 0.083% nebulizer solution Inhale 3 mLs (2.5 mg total) into the lungs every 4 (four) hours as needed for wheezing or shortness of breath. 01/17/19  Yes Vann, Jessica U, DO  albuterol (VENTOLIN HFA) 108 (90 Base) MCG/ACT inhaler Inhale 1 puff into the lungs every 6 (six) hours as needed for wheezing or shortness  of breath.   Yes [provider]  amLODipine (NORVASC) 5 MG tablet Take 5 mg by mouth daily. 09/25/19  Yes [provider]  aspirin 81 MG EC tablet Take by mouth daily. 11/07/15  Yes [provider]  atorvastatin (LIPITOR) 80 MG tablet Take 1 tablet (80 mg total) by mouth daily at 6 PM. Patient taking differently: Take 80 mg by mouth daily. 12/21/18  Yes Mikhail, Maryann, DO  escitalopram (LEXAPRO) 10 MG tablet Take 1 tablet (10 mg total) by mouth at bedtime. Patient taking differently: Take 10 mg by mouth daily. 11/07/19   Yes Cameron Sprang, MD  feeding supplement (ENSURE ENLIVE / ENSURE PLUS) LIQD Take 237 mLs by mouth 2 (two) times daily between meals. Patient taking differently: Take 237 mLs by mouth daily. 06/28/20  Yes Renato Shin, MD  Insulin Degludec (TRESIBA) 100 UNIT/ML SOLN Inject 25 Units into the skin daily. 06/28/20  Yes Renato Shin, MD  TRADJENTA 5 MG TABS tablet Take 5 mg by mouth daily. 10/24/19  Yes [provider]  clopidogrel (PLAVIX) 75 MG tablet Take 1 tablet (75 mg total) by mouth daily. Patient not taking: No sig reported 12/22/18   Cristal Ford, DO  glucose blood (ACCU-CHEK GUIDE) test strip 1 each by Other route in the morning and at bedtime. And lancets 2/day 05/21/20   Renato Shin, MD    Allergies    Patient has no known allergies.  Review of Systems   Review of Systems  Unable to perform ROS: Dementia    Physical Exam Updated Vital Signs BP (!) 145/63   Pulse 100   Temp 98.8 F (37.1 C) (Oral)   Resp 17   Ht 5\' 2"  (1.575 m)   Wt 61.2 kg   SpO2 91%   BMI 24.69 kg/m   Physical Exam Constitutional:      General: She is not in acute distress.    Appearance: Normal appearance.  HENT:     Head: Normocephalic.     Nose: Nose normal.  Eyes:     Extraocular Movements: Extraocular movements intact.  Cardiovascular:     Rate and Rhythm: Normal rate.  Pulmonary:     Effort: Pulmonary effort is normal.  Musculoskeletal:        General: Normal range of motion.     Cervical back: Normal range of motion.  Skin:    Comments: In theCellulitis of the right foot first second and third digits of the right foot as well as the dorsum of the foot approximately 4 x 5 cm region.  In the webspace between the first and second toes there is a bullous lesion approximately 2 x 1 cm.  Neurological:     General: No focal deficit present.     Mental Status: She is alert. Mental status is at baseline.     ED Results / Procedures / Treatments   Labs (all labs ordered  are listed, but only abnormal results are displayed) Labs Reviewed  BASIC METABOLIC PANEL - Abnormal; Notable for the following components:      Result Value   Glucose, Bld 334 (*)    All other components within normal limits  CBC WITH DIFFERENTIAL/PLATELET - Abnormal; Notable for the following components:   WBC 15.1 (*)    Hemoglobin 9.7 (*)    HCT 30.8 (*)    MCV 78.6 (*)    MCH 24.7 (*)    RDW 16.1 (*)    Neutro Abs 12.2 (*)  All other components within normal limits  SARS CORONAVIRUS 2 BY RT PCR (HOSPITAL ORDER, [facility])  CULTURE, BLOOD (ROUTINE X 2)  CULTURE, BLOOD (ROUTINE X 2)  BRAIN NATRIURETIC PEPTIDE  TROPONIN I (HIGH SENSITIVITY)  TROPONIN I (HIGH SENSITIVITY)    EKG None  Radiology DG Chest 1 View  Result Date: 08/26/2020 CLINICAL DATA:  Lower extremity pain EXAM: CHEST  1 VIEW COMPARISON:  01/13/2019 FINDINGS: Cardiac shadow is at the upper limits of normal in size. Aortic calcifications are again noted. Calcified granuloma is noted in the right mid lung. The lungs are well aerated bilaterally. Mild interstitial edema is seen. No bony abnormality is noted. IMPRESSION: Mild interstitial edema. Electronically Signed   By: Inez Catalina M.D.   On: 08/26/2020 21:35   DG Foot 2 Views Right  Result Date: 08/26/2020 CLINICAL DATA:  First toe infection EXAM: RIGHT FOOT - 2 VIEW COMPARISON:  None. FINDINGS: No acute fracture or dislocation is noted. No gross soft tissue abnormality is seen. Calcaneal spurring is noted. IMPRESSION: No acute abnormality seen. Electronically Signed   By: Inez Catalina M.D.   On: 08/26/2020 21:36    Procedures Procedures   Medications Ordered in ED Medications  vancomycin (VANCOCIN) IVPB 1000 mg/200 mL premix (1,000 mg Intravenous New Bag/Given 08/26/20 2159)    ED Course  I have reviewed the triage vital signs and the nursing notes.  Pertinent labs & imaging results that were available during my care of the  patient were reviewed by me and considered in my medical decision making (see chart for details).    MDM Rules/Calculators/A&P                          Patient with significant peripheral vascular disease, presents with worsening cellulitis of the right lower extremity and pain.  No reported fevers.  White count elevated 15.  Blood culture sent, started on vancomycin IV.  Patient is mildly hypoxic with O2 saturation 90 to 94%, this appears to be consistent for the past month intermittently since she was diagnosed with Covid about a month ago.  At bedside vascular Doppler shows good dorsalis pedis pulses bilaterally.   Final Clinical Impression(s) / ED Diagnoses Final diagnoses:  Cellulitis of right lower extremity  Peripheral vascular disease [facility])    Rx / DC Orders ED Discharge Orders    None       Luna Fuse, MD 08/26/20 2209

## 2020-08-26 NOTE — ED Triage Notes (Addendum)
Pt arrives with c/o "severe BLE pain". Hx PVD, COPD, Dementia, DM and Osteomyelitis. Pt sts on 1/25 having [place] of arteries that showed no blood flow to bilateral BLE. No pulse palpated. Wound on left ankle. Covid + a month ago.

## 2020-08-26 NOTE — H&P (Addendum)
History and Physical   Crystal Moore YQM:[id number] DOB: 02/10/1946 DOA: 08/26/2020  Referring MD/NP/PA: Dr. Almyra Free  PCP: [name], MD   Outpatient Specialists: Dr. Linton Ham  Patient coming from: Home  Chief Complaint: Left leg pain  HPI: Crystal Moore is a 75 y.o. female with medical history significant of diabetes, peripheral vascular disease, tobacco use disorder, history of CVA, essential hypertension with significant vascular ulcer of the left lateral ankle now presenting with a right foot cellulitis and possible gangrene involving the right big toe and the second toe.  Patient's complaint however is about pain in the left ankle where she already has an ulcer.  She is being treated in the outpatient setting with plan for referral to vascular surgery after a visit with with podiatry this Thursday.  Patient was apparently had an outpatient ABI showing severe peripheral vascular disease in both legs.  She is brought in today by her grandson who reported her inability to sleep.  Also has been disable mostly bedbound due to her medical problems.  Patient is positive on aspirin and Plavix for the peripheral vascular disease but has not been taking the Plavix.  She denied any fever or chills.  Noted to have leukocytosis and suspected cellulitis of the right foot with severe peripheral vascular disease.  She is being admitted to the hospital for further evaluation and treatment...  ED Course: Temperature is 100.2 blood pressure 156/89 her pulse is 114 respiratory rate 23 oxygen sat 90% on room air.  White count is 15.1 hemoglobin 9.7.  Chemistry largely within normal.  X-ray of the right foot shows no acute finding chest x-ray showed no significant findings.  A recent ABI done about a week ago showed significant peripheral vascular disease and arterial occlusive disease in both lower extremities with ABIs of 0.50 and 0.57 on the right and left respectively.  Patient is being  admitted to the hospital for further evaluation and treatment.  Review of Systems: As per HPI otherwise 10 point review of systems negative.    Past Medical History:  Diagnosis Date  . Arthritis   . COPD (chronic obstructive pulmonary disease) ([place])   . COVID-19 virus infection 07/2020  . CVA (cerebral vascular accident) ([place])   . Diabetes ([place])   . Diabetes mellitus without complication ([place])   . Hemiparesis affecting left side as late effect of cerebrovascular accident (CVA) ([place])   . Pyelonephritis   . Seizures ([place])   . Stroke ([place])   . Tobacco use   . Tobacco use disorder   . UTI (urinary tract infection)     Past Surgical History:  Procedure Laterality Date  . TUBAL LIGATION       reports that she has been smoking cigarettes. She has a 40.00 pack-year smoking history. She has never used smokeless tobacco. She reports that she does not drink alcohol and does not use drugs.  No Known Allergies  Family History  Problem Relation Age of Onset  . Diabetes Mother   . Diabetes Sister   . Diabetes Brother      Prior to Admission medications   Medication Sig Start Date End Date Taking? Authorizing Provider  albuterol (PROVENTIL) (2.5 MG/3ML) 0.083% nebulizer solution Inhale 3 mLs (2.5 mg total) into the lungs every 4 (four) hours as needed for wheezing or shortness of breath. 01/17/19  Yes Vann, Jessica U, DO  albuterol (VENTOLIN HFA) 108 (90 Base) MCG/ACT inhaler Inhale 1 puff into the lungs every 6 (six) hours  as needed for wheezing or shortness of breath.   Yes [provider]  amLODipine (NORVASC) 5 MG tablet Take 5 mg by mouth daily. 09/25/19  Yes [provider]  aspirin 81 MG EC tablet Take by mouth daily. 11/07/15  Yes [provider]  atorvastatin (LIPITOR) 80 MG tablet Take 1 tablet (80 mg total) by mouth daily at 6 PM. Patient taking differently: Take 80 mg by mouth daily. 12/21/18  Yes Mikhail, Maryann, DO  escitalopram (LEXAPRO) 10 MG  tablet Take 1 tablet (10 mg total) by mouth at bedtime. Patient taking differently: Take 10 mg by mouth daily. 11/07/19  Yes Cameron Sprang, MD  feeding supplement (ENSURE ENLIVE / ENSURE PLUS) LIQD Take 237 mLs by mouth 2 (two) times daily between meals. Patient taking differently: Take 237 mLs by mouth daily. 06/28/20  Yes Renato Shin, MD  Insulin Degludec (TRESIBA) 100 UNIT/ML SOLN Inject 25 Units into the skin daily. 06/28/20  Yes Renato Shin, MD  TRADJENTA 5 MG TABS tablet Take 5 mg by mouth daily. 10/24/19  Yes [provider]  clopidogrel (PLAVIX) 75 MG tablet Take 1 tablet (75 mg total) by mouth daily. Patient not taking: No sig reported 12/22/18   Cristal Ford, DO  glucose blood (ACCU-CHEK GUIDE) test strip 1 each by Other route in the morning and at bedtime. And lancets 2/day 05/21/20   Renato Shin, MD    Physical Exam: Vitals:   08/26/20 2115 08/26/20 2130 08/26/20 2145 08/26/20 2200  BP:   (!) 145/63 (!) 156/89  Pulse: 100 98 100 100  Resp: 16 14 17 16   Temp:      TempSrc:      SpO2: 94% 92% 91% 93%  Weight:      Height:          Constitutional: NAD, calm, comfortable Vitals:   08/26/20 2115 08/26/20 2130 08/26/20 2145 08/26/20 2200  BP:   (!) 145/63 (!) 156/89  Pulse: 100 98 100 100  Resp: 16 14 17 16   Temp:      TempSrc:      SpO2: 94% 92% 91% 93%  Weight:      Height:       Eyes: PERRL, lids and conjunctivae normal ENMT: Mucous membranes are moist. Posterior pharynx clear of any exudate or lesions.Normal dentition.  Neck: normal, supple, no masses, no thyromegaly Respiratory: clear to auscultation bilaterally, no wheezing, no crackles. Normal respiratory effort. No accessory muscle use.  Cardiovascular: Regular rate and rhythm, no murmurs / rubs / gallops. No extremity edema. 2+ pedal pulses. No carotid bruits.  Abdomen: no tenderness, no masses palpated. No hepatosplenomegaly. Bowel sounds positive.  Musculoskeletal: no clubbing / cyanosis.  No joint deformity upper and lower extremities. Good ROM, no contractures. Normal muscle tone.  Skin: Left foot ankle ulcer and right foot gangrene and cellulitis involving the big toe second and third toes and between the toes Neurologic: CN 2-12 grossly intact. Sensation intact, DTR normal. Strength 5/5 in all 4.  Psychiatric: Slightly confused: Not communicating as much.  History obtained from the grandson for the most part.    Labs on Admission: I have personally reviewed following labs and imaging studies  CBC: Recent Labs  Lab 08/26/20 2034  WBC 15.1*  NEUTROABS 12.2*  HGB 9.7*  HCT 30.8*  MCV 78.6*  PLT 875   Basic Metabolic Panel: Recent Labs  Lab 08/26/20 2034  NA 136  K 4.0  CL 100  CO2 24  GLUCOSE 334*  BUN 18  CREATININE 0.87  CALCIUM 9.3   GFR: Estimated Creatinine Clearance: 49.5 mL/min (by C-G formula based on SCr of 0.87 mg/dL). Liver Function Tests: No results for input(s): AST, ALT, ALKPHOS, BILITOT, PROT, ALBUMIN in the last 168 hours. No results for input(s): LIPASE, AMYLASE in the last 168 hours. No results for input(s): AMMONIA in the last 168 hours. Coagulation Profile: No results for input(s): INR, PROTIME in the last 168 hours. Cardiac Enzymes: No results for input(s): CKTOTAL, CKMB, CKMBINDEX, TROPONINI in the last 168 hours. BNP (last 3 results) No results for input(s): PROBNP in the last 8760 hours. HbA1C: No results for input(s): HGBA1C in the last 72 hours. CBG: No results for input(s): GLUCAP in the last 168 hours. Lipid Profile: No results for input(s): CHOL, HDL, LDLCALC, TRIG, CHOLHDL, LDLDIRECT in the last 72 hours. Thyroid Function Tests: No results for input(s): TSH, T4TOTAL, FREET4, T3FREE, THYROIDAB in the last 72 hours. Anemia Panel: No results for input(s): VITAMINB12, FOLATE, FERRITIN, TIBC, IRON, RETICCTPCT in the last 72 hours. Urine analysis:    Component Value Date/Time   COLORURINE YELLOW 01/14/2019 1010    APPEARANCEUR HAZY (A) 01/14/2019 1010   LABSPEC 1.019 01/14/2019 1010   PHURINE 5.0 01/14/2019 1010   GLUCOSEU NEGATIVE 01/14/2019 1010   HGBUR NEGATIVE 01/14/2019 1010   BILIRUBINUR NEGATIVE 01/14/2019 1010   KETONESUR NEGATIVE 01/14/2019 1010   PROTEINUR NEGATIVE 01/14/2019 1010   NITRITE NEGATIVE 01/14/2019 1010   LEUKOCYTESUR NEGATIVE 01/14/2019 1010   Sepsis Labs: @LABRCNTIP (procalcitonin:4,lacticidven:4) ) Recent Results (from the past 240 hour(s))  SARS Coronavirus 2 by RT PCR (hospital order, performed in [facility] hospital lab) Nasopharyngeal Nasopharyngeal Swab     Status: None   Collection Time: 08/26/20  8:56 PM   Specimen: Nasopharyngeal Swab  Result Value Ref Range Status   SARS Coronavirus 2 NEGATIVE NEGATIVE Final    Comment: (NOTE) SARS-CoV-2 target nucleic acids are NOT DETECTED.  The SARS-CoV-2 RNA is generally detectable in upper and lower respiratory specimens during the acute phase of infection. The lowest concentration of SARS-CoV-2 viral copies this assay can detect is 250 copies / mL. A negative result does not preclude SARS-CoV-2 infection and should not be used as the sole basis for treatment or other patient management decisions.  A negative result may occur with improper specimen collection / handling, submission of specimen other than nasopharyngeal swab, presence of viral mutation(s) within the areas targeted by this assay, and inadequate number of viral copies (<250 copies / mL). A negative result must be combined with clinical observations, patient history, and epidemiological information.  Fact Sheet for Patients:   StrictlyIdeas.no  Fact Sheet for Healthcare Providers: BankingDealers.co.za  This test is not yet approved or  cleared by the [place] FDA and has been authorized for detection and/or diagnosis of SARS-CoV-2 by FDA under an Emergency Use Authorization (EUA).  This EUA will  remain in effect (meaning this test can be used) for the duration of the COVID-19 declaration under Section 564(b)(1) of the Act, 21 U.S.C. section 360bbb-3(b)(1), unless the authorization is terminated or revoked sooner.  Performed at [facility], [place] [address], [place], [place] [address]      Radiological Exams on Admission: DG Chest 1 View  Result Date: 08/26/2020 CLINICAL DATA:  Lower extremity pain EXAM: CHEST  1 VIEW COMPARISON:  01/13/2019 FINDINGS: Cardiac shadow is at the upper limits of normal in size. Aortic calcifications are again noted. Calcified granuloma is noted in the right  mid lung. The lungs are well aerated bilaterally. Mild interstitial edema is seen. No bony abnormality is noted. IMPRESSION: Mild interstitial edema. Electronically Signed   By: Inez Catalina M.D.   On: 08/26/2020 21:35   DG Foot 2 Views Right  Result Date: 08/26/2020 CLINICAL DATA:  First toe infection EXAM: RIGHT FOOT - 2 VIEW COMPARISON:  None. FINDINGS: No acute fracture or dislocation is noted. No gross soft tissue abnormality is seen. Calcaneal spurring is noted. IMPRESSION: No acute abnormality seen. Electronically Signed   By: Inez Catalina M.D.   On: 08/26/2020 21:36      Assessment/Plan Principal Problem:   Cellulitis of great toe of right foot Active Problems:   COPD (chronic obstructive pulmonary disease) (HCC)   ILD (interstitial lung disease) (HCC)   Sepsis (HCC)   Hypertension   Type 2 diabetes mellitus with vascular disease ([place])   Ankle ulcer ([place])     #1 right foot cellulitis and possible gangrene: Patient will be admitted to the hospital.  Initiate vancomycin and cefepime.  Blood cultures obtained.  We will consult podiatry in the morning and vascular surgery subsequently.  Patient was scheduled to have those follows in the outpatient anyway.  Continue aspirin and add the Plavix as well.  Continue wound care  #2 severe peripheral vascular disease  bilaterally: Patient will need vascular surgery evaluation and treatment.  We will have that consult.  I will also resume her aspirin and Plavix.  #3 sepsis due to cellulitis and gangrene: Patient meets sepsis criteria with her leukocytosis tachycardia and respiratory rate.  Continue antibiotics and fluids as above.  #4 insulin-dependent diabetes: Continue long-acting and short-acting insulin with sliding scale.  #5 interstitial lung disease: No respiratory distress.  Continue monitoring  #6 COPD: No exacerbation.  To back cessation counseling monitoring.  #7 generalized debility: We will need PT and OT ultimately.  Most likely this is caused by her severe peripheral vascular disease.  #8 recent COVID-19 infection: Patient is outside the window for isolation.  She is about 28 days out.   DVT prophylaxis: Lovenox Code Status: Full code Family Communication: Grandson at bedside Disposition Plan: To be determined Consults called: None but consult podiatry and vascular surgery in the morning Admission status: Inpatient  Severity of Illness: The appropriate patient status for this patient is INPATIENT. Inpatient status is judged to be reasonable and necessary in order to provide the required intensity of service to ensure the patient's safety. The patient's presenting symptoms, physical exam findings, and initial radiographic and laboratory data in the context of their chronic comorbidities is felt to place them at high risk for further clinical deterioration. Furthermore, it is not anticipated that the patient will be medically stable for discharge from the hospital within 2 midnights of admission. The following factors support the patient status of inpatient.   " The patient's presenting symptoms include bilateral leg pain and redness of the right foot. " The worrisome physical exam findings include severe cellulitis and early gangrene of the right big toe. " The initial radiographic and  laboratory data are worrisome because of no significant osteomyelitis. " The chronic co-morbidities include diabetes and peripheral vascular disease.   * I certify that at the point of admission it is my clinical judgment that the patient will require inpatient hospital care spanning beyond 2 midnights from the point of admission due to high intensity of service, high risk for further deterioration and high frequency of surveillance required.Barbette Merino MD  [facility] Pager [phone]  If 7PM-7AM, please contact night-coverage [email or web address] Password [facility]  08/26/2020, 10:54 PM

## 2020-08-27 DIAGNOSIS — L0390 Cellulitis, unspecified: Secondary | ICD-10-CM

## 2020-08-27 DIAGNOSIS — J449 Chronic obstructive pulmonary disease, unspecified: Secondary | ICD-10-CM

## 2020-08-27 DIAGNOSIS — L03031 Cellulitis of right toe: Secondary | ICD-10-CM | POA: Diagnosis not present

## 2020-08-27 LAB — COMPREHENSIVE METABOLIC PANEL
ALT: 13 U/L (ref 0–44)
AST: 12 U/L — ABNORMAL LOW (ref 15–41)
Albumin: 2.9 g/dL — ABNORMAL LOW (ref 3.5–5.0)
Alkaline Phosphatase: 95 U/L (ref 38–126)
Anion gap: 8 (ref 5–15)
BUN: 15 mg/dL (ref 8–23)
CO2: 25 mmol/L (ref 22–32)
Calcium: 8.8 mg/dL — ABNORMAL LOW (ref 8.9–10.3)
Chloride: 106 mmol/L (ref 98–111)
Creatinine, Ser: 0.69 mg/dL (ref 0.44–1.00)
GFR, Estimated: >60 mL/min (ref >60)
Glucose, Bld: 208 mg/dL — ABNORMAL HIGH (ref 70–99)
Potassium: 3.6 mmol/L (ref 3.5–5.1)
Sodium: 139 mmol/L (ref 135–145)
Total Bilirubin: 0.4 mg/dL (ref 0.3–1.2)
Total Protein: 7.1 g/dL (ref 6.5–8.1)

## 2020-08-27 LAB — GLUCOSE, CAPILLARY
Glucose-Capillary: 129 mg/dL — ABNORMAL HIGH (ref 70–99)
Glucose-Capillary: 167 mg/dL — ABNORMAL HIGH (ref 70–99)
Glucose-Capillary: 220 mg/dL — ABNORMAL HIGH (ref 70–99)
Glucose-Capillary: 235 mg/dL — ABNORMAL HIGH (ref 70–99)
Glucose-Capillary: 240 mg/dL — ABNORMAL HIGH (ref 70–99)

## 2020-08-27 LAB — CBC
HCT: 29.6 % — ABNORMAL LOW (ref 36.0–46.0)
Hemoglobin: 9.1 g/dL — ABNORMAL LOW (ref 12.0–15.0)
MCH: 24.5 pg — ABNORMAL LOW (ref 26.0–34.0)
MCHC: 30.7 g/dL (ref 30.0–36.0)
MCV: 79.8 fL — ABNORMAL LOW (ref 80.0–100.0)
Platelets: 327 10*3/uL (ref 150–400)
RBC: 3.71 MIL/uL — ABNORMAL LOW (ref 3.87–5.11)
RDW: 16 % — ABNORMAL HIGH (ref 11.5–15.5)
WBC: 12.6 10*3/uL — ABNORMAL HIGH (ref 4.0–10.5)
nRBC: 0 % (ref 0.0–0.2)

## 2020-08-27 LAB — HEMOGLOBIN A1C
Hgb A1c MFr Bld: 10.1 % — ABNORMAL HIGH (ref 4.8–5.6)
Mean Plasma Glucose: 243.17 mg/dL

## 2020-08-27 LAB — LACTIC ACID, PLASMA
Lactic Acid, Venous: 1.6 mmol/L (ref 0.5–1.9)
Lactic Acid, Venous: 2.4 mmol/L (ref 0.5–1.9)

## 2020-08-27 LAB — TROPONIN I (HIGH SENSITIVITY): Troponin I (High Sensitivity): 8 ng/L (ref <18)

## 2020-08-27 MED ORDER — ENOXAPARIN SODIUM 40 MG/0.4ML ~~LOC~~ SOLN
40.0000 mg | SUBCUTANEOUS | Status: DC
  Administered 2020-08-27 – 2020-09-03 (×7): 40 mg via SUBCUTANEOUS
  Filled 2020-08-27 (×7): qty 0.4

## 2020-08-27 MED ORDER — SODIUM CHLORIDE 0.9 % IV SOLN: INTRAVENOUS | Status: DC

## 2020-08-27 MED ORDER — SODIUM CHLORIDE 0.9 % IV BOLUS
500.0000 mL | Freq: Once | INTRAVENOUS | Status: AC
  Administered 2020-08-27: 500 mL via INTRAVENOUS

## 2020-08-27 MED ORDER — ONDANSETRON HCL 4 MG PO TABS: 4.0000 mg | ORAL_TABLET | Freq: Four times a day (QID) | ORAL | Status: DC | PRN

## 2020-08-27 MED ORDER — ONDANSETRON HCL 4 MG/2ML IJ SOLN
4.0000 mg | Freq: Four times a day (QID) | INTRAMUSCULAR | Status: DC | PRN
  Administered 2020-08-27: 4 mg via INTRAVENOUS
  Filled 2020-08-27: qty 2

## 2020-08-27 MED ORDER — INSULIN ASPART 100 UNIT/ML ~~LOC~~ SOLN
0.0000 [IU] | Freq: Every day | SUBCUTANEOUS | Status: DC
  Administered 2020-08-27 – 2020-09-05 (×5): 2 [IU] via SUBCUTANEOUS

## 2020-08-27 MED ORDER — INSULIN ASPART 100 UNIT/ML ~~LOC~~ SOLN
0.0000 [IU] | Freq: Three times a day (TID) | SUBCUTANEOUS | Status: DC
  Administered 2020-08-27: 3 [IU] via SUBCUTANEOUS
  Administered 2020-08-27: 5 [IU] via SUBCUTANEOUS
  Administered 2020-08-27 – 2020-08-28 (×4): 2 [IU] via SUBCUTANEOUS
  Administered 2020-08-29 (×2): 3 [IU] via SUBCUTANEOUS
  Administered 2020-08-30: 5 [IU] via SUBCUTANEOUS
  Administered 2020-08-31: 2 [IU] via SUBCUTANEOUS
  Administered 2020-08-31 – 2020-09-01 (×2): 3 [IU] via SUBCUTANEOUS
  Administered 2020-09-01 – 2020-09-02 (×2): 5 [IU] via SUBCUTANEOUS
  Administered 2020-09-02: 3 [IU] via SUBCUTANEOUS
  Administered 2020-09-02: 5 [IU] via SUBCUTANEOUS
  Administered 2020-09-03 (×3): 2 [IU] via SUBCUTANEOUS
  Administered 2020-09-04: 5 [IU] via SUBCUTANEOUS
  Administered 2020-09-05: 3 [IU] via SUBCUTANEOUS
  Administered 2020-09-05: 5 [IU] via SUBCUTANEOUS
  Administered 2020-09-06: 3 [IU] via SUBCUTANEOUS

## 2020-08-27 NOTE — Progress Notes (Signed)
CRITICAL VALUE ALERT  CRITICAL VALUE: Lactic Acid 2.4  DATE & TIME NOTIFIED: 08/27/2020 at 2019  PROVIDER NOTIFIED: X. [place] NP  TIME OF NOTIFICATION: 2024  ORDERS RECEIVED: None at this time

## 2020-08-27 NOTE — Progress Notes (Signed)
PROGRESS NOTE    AUNDRA PUNG  ZOX:[id number] DOB: 13-Oct-1945 DOA: 08/26/2020 PCP: [name], MD    Brief Narrative:Crystal Moore is a 75 y.o. female with medical history significant of diabetes, peripheral vascular disease, tobacco use disorder, history of CVA, essential hypertension with significant vascular ulcer of the left lateral ankle now presenting with a right foot cellulitis and possible gangrene involving the right big toe and the second toe.  Patient's complaint however is about pain in the left ankle where she already has an ulcer.  She is being treated in the outpatient setting with plan for referral to vascular surgery after a visit with with podiatry this Thursday.  Patient was apparently had an outpatient ABI showing severe peripheral vascular disease in both legs.  She is brought in today by her grandson who reported her inability to sleep.  Also has been disable mostly bedbound due to her medical problems.  Patient is positive on aspirin and Plavix for the peripheral vascular disease but has not been taking the Plavix.  She denied any fever or chills.  Noted to have leukocytosis and suspected cellulitis of the right foot with severe peripheral vascular disease.  She is being admitted to the hospital for further evaluation and treatment...  ED Course: Temperature is 100.2 blood pressure 156/89 her pulse is 114 respiratory rate 23 oxygen sat 90% on room air.  White count is 15.1 hemoglobin 9.7.  Chemistry largely within normal.  X-ray of the right foot shows no acute finding chest x-ray showed no significant findings.  A recent ABI done about a week ago showed significant peripheral vascular disease and arterial occlusive disease in both lower extremities with ABIs of 0.50 and 0.57 on the right and left respectively.  Patient is being admitted to the hospital for further evaluation and treatment.  Assessment & Plan:   Principal Problem:   Cellulitis of great toe of  right foot Active Problems:   COPD (chronic obstructive pulmonary disease) (HCC)   ILD (interstitial lung disease) (HCC)   Sepsis (HCC)   Hypertension   Type 2 diabetes mellitus with vascular disease ([place])   Ankle ulcer ([place])   #1 Right foot cellulitis arterial ultrasound done 08/20/2020 noted with severe PVD bilaterally. She was followed by wound care as an outpatient. I have consulted with Dr. Donnetta Hutching who will order further tests and see the patient. Appreciate his assistance. Continue antibiotics she is on vancomycin and cefepime monitor renal functions pharmacy following vancomycin levels.  #2 severe bilateral PVD continue aspirin Plavix vascular surgery consulted.  #3  Sepsis present on admission patient met this criteria with tachypnea tachycardia leukocytosis.  However I do not have a lactic acid on this patient.  This is likely secondary to right foot cellulitis. Obtain lactic acid and trend. WBCs trending down to 12.6 from 15.1  #4 type 2 diabetes uncontrolled with hyperglycemia check A1c On Lantus 25 units daily with Tradjenta 5 mg daily. Continue insulin and SSI CBG (last 3)  Recent Labs    08/27/20 0052 08/27/20 0805 08/27/20 1143  GLUCAP 240* 220* 167*   #5 history of interstitial lung disease/COPD stable continue albuterol  #6 status post Covid 28 days ago  #7 hyperlipidemia continue statin  #8 history of essential hypertension continue Norvasc  #9 history of depression on Lexapro    Estimated body mass index is 23.19 kg/m as calculated from the following:   Height as of this encounter: 5' 2"  (1.575 m).   Weight as of  this encounter: 57.5 kg.  DVT prophylaxis: Lovenox Code Status: Full code Family Communication: Discussed with patient's daughter.   Disposition Plan:  Status is: Inpatient   Dispo: The patient is from: Home              Anticipated d/c is to: Home              Anticipated d/c date is: > 3 days              Patient currently is not  medically stable to d/c. Consultants:   Vascular surgery  Procedures: None Antimicrobials: Vancomycin cefepime  Subjective: Patient resting in bed sleeping  Objective: Vitals:   08/27/20 0035 08/27/20 0512 08/27/20 0928 08/27/20 1246  BP:  (!) 142/61 (!) 147/68 139/60  Pulse:  88 78 80  Resp:  16 16 18   Temp:  98.8 F (37.1 C) 98.2 F (36.8 C) 98 F (36.7 C)  TempSrc:  Oral Oral Oral  SpO2:  97% 98% 100%  Weight: 57.5 kg     Height: 5' 2"  (1.575 m)       Intake/Output Summary (Last 24 hours) at 08/27/2020 1511 Last data filed at 08/27/2020 1409 Gross per 24 hour  Intake 120 ml  Output 250 ml  Net -130 ml   Filed Weights   08/26/20 2003 08/27/20 0035  Weight: 61.2 kg 57.5 kg    Examination:  General exam: Appears calm and comfortable  Respiratory system: Clear to auscultation. Respiratory effort normal. Cardiovascular system: S1 & S2 heard, RRR. No JVD, murmurs, rubs, gallops or clicks. No pedal edema. Gastrointestinal system: Abdomen is nondistended, soft and nontender. No organomegaly or masses felt. Normal bowel sounds heard. Central nervous system: Alert and oriented. No focal neurological deficits. Extremities right foot cellulitis involving the right second and third toes and left foot ankle ulcer which is covered. Skin: No rashes, lesions or ulcers Psychiatry: Resting not communicating much.    Data Reviewed: I have personally reviewed following labs and imaging studies  CBC: Recent Labs  Lab 08/26/20 2034 08/27/20 0309  WBC 15.1* 12.6*  NEUTROABS 12.2*  --   HGB 9.7* 9.1*  HCT 30.8* 29.6*  MCV 78.6* 79.8*  PLT 389 588   Basic Metabolic Panel: Recent Labs  Lab 08/26/20 2034 08/27/20 0309  NA 136 139  K 4.0 3.6  CL 100 106  CO2 24 25  GLUCOSE 334* 208*  BUN 18 15  CREATININE 0.87 0.69  CALCIUM 9.3 8.8*   GFR: Estimated Creatinine Clearance: 49.5 mL/min (by C-G formula based on SCr of 0.69 mg/dL). Liver Function Tests: Recent Labs   Lab 08/27/20 0309  AST 12*  ALT 13  ALKPHOS 95  BILITOT 0.4  PROT 7.1  ALBUMIN 2.9*   No results for input(s): LIPASE, AMYLASE in the last 168 hours. No results for input(s): AMMONIA in the last 168 hours. Coagulation Profile: No results for input(s): INR, PROTIME in the last 168 hours. Cardiac Enzymes: No results for input(s): CKTOTAL, CKMB, CKMBINDEX, TROPONINI in the last 168 hours. BNP (last 3 results) No results for input(s): PROBNP in the last 8760 hours. HbA1C: No results for input(s): HGBA1C in the last 72 hours. CBG: Recent Labs  Lab 08/27/20 0052 08/27/20 0805 08/27/20 1143  GLUCAP 240* 220* 167*   Lipid Profile: No results for input(s): CHOL, HDL, LDLCALC, TRIG, CHOLHDL, LDLDIRECT in the last 72 hours. Thyroid Function Tests: No results for input(s): TSH, T4TOTAL, FREET4, T3FREE, THYROIDAB in the last 72 hours. Anemia  Panel: No results for input(s): VITAMINB12, FOLATE, FERRITIN, TIBC, IRON, RETICCTPCT in the last 72 hours. Sepsis Labs: No results for input(s): PROCALCITON, LATICACIDVEN in the last 168 hours.  Recent Results (from the past 240 hour(s))  SARS Coronavirus 2 by RT PCR (hospital order, performed in [facility] hospital lab) Nasopharyngeal Nasopharyngeal Swab     Status: None   Collection Time: 08/26/20  8:56 PM   Specimen: Nasopharyngeal Swab  Result Value Ref Range Status   SARS Coronavirus 2 NEGATIVE NEGATIVE Final    Comment: (NOTE) SARS-CoV-2 target nucleic acids are NOT DETECTED.  The SARS-CoV-2 RNA is generally detectable in upper and lower respiratory specimens during the acute phase of infection. The lowest concentration of SARS-CoV-2 viral copies this assay can detect is 250 copies / mL. A negative result does not preclude SARS-CoV-2 infection and should not be used as the sole basis for treatment or other patient management decisions.  A negative result may occur with improper specimen collection / handling, submission of specimen  other than nasopharyngeal swab, presence of viral mutation(s) within the areas targeted by this assay, and inadequate number of viral copies (<250 copies / mL). A negative result must be combined with clinical observations, patient history, and epidemiological information.  Fact Sheet for Patients:   StrictlyIdeas.no  Fact Sheet for Healthcare Providers: BankingDealers.co.za  This test is not yet approved or  cleared by the [place] FDA and has been authorized for detection and/or diagnosis of SARS-CoV-2 by FDA under an Emergency Use Authorization (EUA).  This EUA will remain in effect (meaning this test can be used) for the duration of the COVID-19 declaration under Section 564(b)(1) of the Act, 21 U.S.C. section 360bbb-3(b)(1), unless the authorization is terminated or revoked sooner.  Performed at [facility], [place] [address] [place], [place] [address]          Radiology Studies: DG Chest 1 View  Result Date: 08/26/2020 CLINICAL DATA:  Lower extremity pain EXAM: CHEST  1 VIEW COMPARISON:  01/13/2019 FINDINGS: Cardiac shadow is at the upper limits of normal in size. Aortic calcifications are again noted. Calcified granuloma is noted in the right mid lung. The lungs are well aerated bilaterally. Mild interstitial edema is seen. No bony abnormality is noted. IMPRESSION: Mild interstitial edema. Electronically Signed   By: Inez Catalina M.D.   On: 08/26/2020 21:35   DG Foot 2 Views Right  Result Date: 08/26/2020 CLINICAL DATA:  First toe infection EXAM: RIGHT FOOT - 2 VIEW COMPARISON:  None. FINDINGS: No acute fracture or dislocation is noted. No gross soft tissue abnormality is seen. Calcaneal spurring is noted. IMPRESSION: No acute abnormality seen. Electronically Signed   By: Inez Catalina M.D.   On: 08/26/2020 21:36        Scheduled Meds: . amLODipine  5 mg Oral Daily  . aspirin EC  81 mg Oral Daily   . atorvastatin  80 mg Oral Daily  . clopidogrel  75 mg Oral Daily  . enoxaparin (LOVENOX) injection  40 mg Subcutaneous Q24H  . escitalopram  10 mg Oral Daily  . feeding supplement  237 mL Oral Daily  . insulin aspart  0-15 Units Subcutaneous TID WC  . insulin aspart  0-5 Units Subcutaneous QHS  . insulin glargine  25 Units Subcutaneous Daily  . linagliptin  5 mg Oral Daily   Continuous Infusions: . sodium chloride 75 mL/hr at 08/27/20 0043  . ceFEPime (MAXIPIME) IV 2 g (08/27/20 1211)  . vancomycin  LOS: 1 day   Georgette Shell, MD 08/27/2020, 3:11 PM

## 2020-08-27 NOTE — Consult Note (Signed)
[facility] [place]  Patient name: Crystal Moore MRN: [id number] DOB: 1946-03-23 Sex: female    HPI: Crystal Moore is a 75 y.o. female seen in consultation for tissue loss on both lower extremities.  She has a complex past history.  She has had a prior major stroke and has dementia.  She is not able to answer questions.  She is nonambulatory.  She does know she is in the hospital.  She has been seen in the wound center for an ulceration over her left lateral malleolus.  She did undergo noninvasive studies as an outpatient and had been scheduled to see vascular surgery as an outpatient.  She was admitted on 08/26/2020 with concern of worsening tissue loss  Past Medical History:  Diagnosis Date  . Arthritis   . COPD (chronic obstructive pulmonary disease) ([place])   . COVID-19 virus infection 07/2020  . CVA (cerebral vascular accident) ([place])   . Diabetes ([place])   . Diabetes mellitus without complication ([place])   . Hemiparesis affecting left side as late effect of cerebrovascular accident (CVA) ([place])   . Pyelonephritis   . Seizures ([place])   . Stroke ([place])   . Tobacco use   . Tobacco use disorder   . UTI (urinary tract infection)     Family History  Problem Relation Age of Onset  . Diabetes Mother   . Diabetes Sister   . Diabetes Brother     SOCIAL HISTORY: Social History   Tobacco Use  . Smoking status: Current Every Day Smoker    Packs/day: 1.00    Years: 40.00    Pack years: 40.00    Types: Cigarettes  . Smokeless tobacco: Never Used  Substance Use Topics  . Alcohol use: Never    No Known Allergies  Current Facility-Administered Medications  Medication Dose Route Frequency Provider Last Rate Last Admin  . albuterol (VENTOLIN HFA) 108 (90 Base) MCG/ACT inhaler 1 puff  1 puff Inhalation Q6H PRN Jonelle Sidle, Mohammad L, MD      . amLODipine (NORVASC) tablet 5 mg  5 mg Oral Daily Gala Romney L, MD   5 mg at 08/27/20  1028  . aspirin EC tablet 81 mg  81 mg Oral Daily Gala Romney L, MD   81 mg at 08/27/20 1028  . atorvastatin (LIPITOR) tablet 80 mg  80 mg Oral Daily Gala Romney L, MD   80 mg at 08/27/20 1028  . ceFEPIme (MAXIPIME) 2 g in sodium chloride 0.9 % 100 mL IVPB  2 g Intravenous Q12H Poindexter, Leann T, RPH 200 mL/hr at 08/27/20 1211 2 g at 08/27/20 1211  . clopidogrel (PLAVIX) tablet 75 mg  75 mg Oral Daily Gala Romney L, MD   75 mg at 08/27/20 1028  . enoxaparin (LOVENOX) injection 40 mg  40 mg Subcutaneous Q24H Gala Romney L, MD   40 mg at 08/27/20 1028  . escitalopram (LEXAPRO) tablet 10 mg  10 mg Oral Daily Garba, Mohammad L, MD      . feeding supplement (ENSURE ENLIVE / ENSURE PLUS) liquid 237 mL  237 mL Oral Daily Gala Romney L, MD   237 mL at 08/27/20 1034  . insulin aspart (novoLOG) injection 0-15 Units  0-15 Units Subcutaneous TID WC Elwyn Reach, MD   3 Units at 08/27/20 1208  . insulin aspart (novoLOG) injection 0-5 Units  0-5 Units Subcutaneous QHS Elwyn Reach, MD   2 Units at 08/27/20 0055  . insulin  glargine (LANTUS) injection 25 Units  25 Units Subcutaneous Daily Elwyn Reach, MD   25 Units at 08/27/20 1028  . linagliptin (TRADJENTA) tablet 5 mg  5 mg Oral Daily Gala Romney L, MD   5 mg at 08/27/20 1028  . morphine 2 MG/ML injection 2 mg  2 mg Intravenous Q2H PRN Gala Romney L, MD      . ondansetron (ZOFRAN) tablet 4 mg  4 mg Oral Q6H PRN Elwyn Reach, MD       Or  . ondansetron (ZOFRAN) injection 4 mg  4 mg Intravenous Q6H PRN Elwyn Reach, MD      . vancomycin (VANCOCIN) IVPB 1000 mg/200 mL premix  1,000 mg Intravenous Q24H Poindexter, Leann T, RPH        REVIEW OF SYSTEMS:  Reviewed in her history and physical with nothing to add  PHYSICAL EXAM: Vitals:   08/27/20 0035 08/27/20 0512 08/27/20 0928 08/27/20 1246  BP:  (!) 142/61 (!) 147/68 139/60  Pulse:  88 78 80  Resp:  16 16 18   Temp:  98.8 F (37.1 C) 98.2 F (36.8 C)  98 F (36.7 C)  TempSrc:  Oral Oral Oral  SpO2:  97% 98% 100%  Weight: 57.5 kg     Height: 5\' 2"  (1.575 m)       GENERAL: The patient is a well-nourished female, in no acute distress. The vital signs are documented above. CARDIOVASCULAR: 2+ radial pulses bilaterally.  2+ femoral pulses bilaterally.  Absent popliteal and pedal pulses bilaterally. PULMONARY: There is good air exchange  MUSCULOSKELETAL: There are no major deformities or cyanosis. NEUROLOGIC: Major prior stroke with contracture and no use of her left arm or leg SKIN: Bullous lesions involving her right great and 2nd toe with surrounding erythema.  This is certainly full-thickness tissue loss.  She also has full-thickness skin loss on her left lateral malleolus that appears to extend to the bone. PSYCHIATRIC: The patient has a normal affect.  Can answer questions but not appropriately.        DATA:  Noninvasive studies at [facility] from 08/20/2020 were reviewed.  This reveals ankle arm index of 0.5 on the right and 0.57 on the left  MEDICAL ISSUES: Bilateral lower extremity ischemia with bilateral foot ulceration.  Patient has had a prior major stroke with severe contracture of her left arm and left leg.  She is nonambulatory.  Patient is at high risk for limb loss.  No role for aggressive revascularization attempts due to her nonambulatory status.  Currently does not appear toxic from either the blisters on her right toes or left lateral malleolus wound.  Would keep the blisters dry and continue local care to the left lateral ankle wound.  Indications for amputation would be pain which she currently is denying or progressive tissue loss.  Vascular will not follow actively.  Would be available for amputation if required.  Patient would need to be transferred to [facility].  No family present for discussion currently    [name], MD [facility] Tel [phone]

## 2020-08-28 DIAGNOSIS — L03031 Cellulitis of right toe: Secondary | ICD-10-CM | POA: Diagnosis not present

## 2020-08-28 LAB — COMPREHENSIVE METABOLIC PANEL
ALT: 15 U/L (ref 0–44)
AST: 14 U/L — ABNORMAL LOW (ref 15–41)
Albumin: 2.7 g/dL — ABNORMAL LOW (ref 3.5–5.0)
Alkaline Phosphatase: 84 U/L (ref 38–126)
Anion gap: 9 (ref 5–15)
BUN: 12 mg/dL (ref 8–23)
CO2: 24 mmol/L (ref 22–32)
Calcium: 8.7 mg/dL — ABNORMAL LOW (ref 8.9–10.3)
Chloride: 102 mmol/L (ref 98–111)
Creatinine, Ser: 0.74 mg/dL (ref 0.44–1.00)
GFR, Estimated: >60 mL/min (ref >60)
Glucose, Bld: 184 mg/dL — ABNORMAL HIGH (ref 70–99)
Potassium: 4.3 mmol/L (ref 3.5–5.1)
Sodium: 135 mmol/L (ref 135–145)
Total Bilirubin: 0.4 mg/dL (ref 0.3–1.2)
Total Protein: 6.8 g/dL (ref 6.5–8.1)

## 2020-08-28 LAB — GLUCOSE, CAPILLARY
Glucose-Capillary: 137 mg/dL — ABNORMAL HIGH (ref 70–99)
Glucose-Capillary: 144 mg/dL — ABNORMAL HIGH (ref 70–99)
Glucose-Capillary: 139 mg/dL — ABNORMAL HIGH (ref 70–99)
Glucose-Capillary: 71 mg/dL (ref 70–99)

## 2020-08-28 LAB — CBC
HCT: 28.7 % — ABNORMAL LOW (ref 36.0–46.0)
Hemoglobin: 8.9 g/dL — ABNORMAL LOW (ref 12.0–15.0)
MCH: 24.7 pg — ABNORMAL LOW (ref 26.0–34.0)
MCHC: 31 g/dL (ref 30.0–36.0)
MCV: 79.5 fL — ABNORMAL LOW (ref 80.0–100.0)
Platelets: 335 10*3/uL (ref 150–400)
RBC: 3.61 MIL/uL — ABNORMAL LOW (ref 3.87–5.11)
RDW: 15.9 % — ABNORMAL HIGH (ref 11.5–15.5)
WBC: 14.3 10*3/uL — ABNORMAL HIGH (ref 4.0–10.5)
nRBC: 0 % (ref 0.0–0.2)

## 2020-08-28 MED ORDER — ACETAMINOPHEN 325 MG PO TABS
650.0000 mg | ORAL_TABLET | Freq: Four times a day (QID) | ORAL | Status: DC | PRN
  Administered 2020-08-28 – 2020-08-31 (×4): 650 mg via ORAL
  Filled 2020-08-28 (×4): qty 2

## 2020-08-28 MED ORDER — METRONIDAZOLE 500 MG PO TABS
500.0000 mg | ORAL_TABLET | Freq: Three times a day (TID) | ORAL | Status: DC
  Administered 2020-08-28 – 2020-08-29 (×3): 500 mg via ORAL
  Filled 2020-08-28 (×3): qty 1

## 2020-08-28 NOTE — Progress Notes (Signed)
PROGRESS NOTE    Crystal Moore  SWF:[id number] DOB: Jun 25, 1946 DOA: 08/26/2020 PCP: [name], MD    Brief Narrative:  Crystal Moore is a 75 year old female with past medical history significant for type 2 diabetes mellitus, peripheral vascular disease with vascular ulcer of left lateral ankle, history of CVA, essential hypertension, dementia, HLD, gout, OSA who presents to ED with right foot redness and darkening of the right great/second toe.  Patient's followed outpatient by wound center and recent ABIs showing severe peripheral vascular disease in both legs.  Patient pending referral to vascular surgery outpatient.  In the ED, temperature 100.2, BP 156/89, HR 114, RR 23, SPO2 90% on room air.  WBC 15.1, hemoglobin 9.7.  X-ray right foot with no acute finding.  Chest x-ray with no acute cardiopulmonary disease process.  Recent ABI roughly 1 week ago with significant peripheral vascular disease and arterial occlusive disease in bilateral lower extremities with ABIs of 0.50 and 0.57 on the right and left respectively.  Patient was admitted to the hospitalist service for further evaluation and treatment of progressive peripheral vascular disease and cellulitis.   Assessment & Plan:   Principal Problem:   Cellulitis of great toe of right foot Active Problems:   COPD (chronic obstructive pulmonary disease) (HCC)   ILD (interstitial lung disease) (HCC)   Sepsis (HCC)   Hypertension   Type 2 diabetes mellitus with vascular disease (HCC)   Ankle ulcer (HCC)   Sepsis, POA Right foot cellulitis Patient presenting with pain, erythema to right foot.  Was noted to be tachycardic with elevated temperature, elevated WBC count and source of right foot infection.  X-ray right foot with no acute process. --WBC 15.1>12.6>14.3 --Continue empiric antibiotics with vancomycin, cefepime, Flagyl  Severe bilateral peripheral vascular disease with ischemia Dry gangrene right great/second  toe, POA Left lateral ankle ulcer, POA Patient presenting with progressive discoloration to right great/second toe.  Recent ABI with severe peripheral vascular disease/peripheral artery disease.  Patient was seen by vascular surgery, Dr. Donnetta Hutching on 08/27/2020.  Dr. Really believes there is no role for aggressive revascularization attempts due to her nonambulatory status and recommends amputation if she was having pain or progressive tissue loss. --Aspirin 81 mg daily, Plavix 75 mg p.o. daily, atorvastatin 80 mg p.o. daily --Continue wound care; outpatient follow-up with wound center --Outpatient follow-up with vascular surgery  Type 2 diabetes mellitus, uncontrolled with hyperglycemia Hemoglobin A1c 10.1, poorly controlled.  Home regimen includes Tresiba 25 unit subcutaneously daily, Tradjenta 5 mg p.o. daily. --Lantus 25 unit subcutaneously daily --Tradjenta 5 mg p.o. daily --Moderate insulin sliding scale for further coverage --CBGs QAC/HS  Essential hypertension BP 113/52 this morning, well controlled. --Amlodipine 5 mg p.o. daily  Hyperlipidemia: Atorvastatin 80 mg p.o. daily  Anxiety/depression: Lexapro 10 mg p.o. daily  Hx ILD/COPD --Albuterol MDI as needed  Dementia --Delirium precautions --Get up during the day --Encourage a familiar face to remain present throughout the day --Keep blinds open and lights on during daylight hours --Minimize the use of opioids/benzodiazepines    DVT prophylaxis: Lovenox   Code Status: Full Code Family Communication: Updated patient's daughter and grandson who is present at bedside  Disposition Plan:  Level of care: Med-Surg Status is: Inpatient  Remains inpatient appropriate because:Ongoing diagnostic testing needed not appropriate for outpatient work up, Unsafe d/c plan, IV treatments appropriate due to intensity of illness or inability to take PO and Inpatient level of care appropriate due to severity of illness   Dispo: The patient  is from: Home              Anticipated d/c is to: Home              Anticipated d/c date is: 2 days              Patient currently is not medically stable to d/c.   Difficult to place patient No   Consultants:   Vascular surgery, Dr. Donnetta Hutching  Procedures:   None  Antimicrobials:   Vancomycin 1/31>>  Cefepime 1/31>>  Flagyl 2/2>>   Subjective: Patient seen and examined at bedside, resting comfortably; lying in bed.  Patient is pleasantly confused.  Crystal Moore and daughter present.  Patient without any specific complaints at this time.  Discussed with both daughter and grandson regarding her significant peripheral vascular disease, cellulitis, wounds and gangrene.  Patient was seen by vascular surgery yesterday, Dr. Donnetta Hutching does not recommend any aggressive intervention given her debilitated state and underlying dementia.  He recommended amputation for pain or progressive tissue loss.  Patient denies headache, no chest pain, no shortness of breath, no overall pain.  No acute concerns overnight per nursing staff.  Objective: Vitals:   08/27/20 1246 08/27/20 2123 08/28/20 0558 08/28/20 1307  BP: 139/60 (!) 143/82 (!) 113/52 (!) 160/63  Pulse: 80 91 72 86  Resp: 18 16 14 18   Temp: 98 F (36.7 C) 99.7 F (37.6 C) 99.1 F (37.3 C) 98.4 F (36.9 C)  TempSrc: Oral Oral Oral Oral  SpO2: 100% 93% 93% 90%  Weight:      Height:        Intake/Output Summary (Last 24 hours) at 08/28/2020 1320 Last data filed at 08/28/2020 1029 Gross per 24 hour  Intake 120 ml  Output 900 ml  Net -780 ml   Filed Weights   08/26/20 2003 08/27/20 0035  Weight: 61.2 kg 57.5 kg    Examination:  General exam: Appears calm and comfortable, pleasantly confused Respiratory system: Clear to auscultation. Respiratory effort normal.  Oxygenating well on room air Cardiovascular system: S1 & S2 heard, RRR. No JVD, murmurs, rubs, gallops or clicks. No pedal edema. Gastrointestinal system: Abdomen is nondistended,  soft and nontender. No organomegaly or masses felt. Normal bowel sounds heard. Central nervous system: Alert, not oriented to person/place/time/situation. No focal neurological deficits. Extremities: Moves all extremities independently Skin: Bullous lesion noted to right great/second toe with surrounding erythema, left lateral malleolus wound Psychiatry: Judgement and insight appear poor. Mood & affect appropriate.         Data Reviewed: I have personally reviewed following labs and imaging studies  CBC: Recent Labs  Lab 08/26/20 2034 08/27/20 0309 08/28/20 0339  WBC 15.1* 12.6* 14.3*  NEUTROABS 12.2*  --   --   HGB 9.7* 9.1* 8.9*  HCT 30.8* 29.6* 28.7*  MCV 78.6* 79.8* 79.5*  PLT 389 327 096   Basic Metabolic Panel: Recent Labs  Lab 08/26/20 2034 08/27/20 0309 08/28/20 0339  NA 136 139 135  K 4.0 3.6 4.3  CL 100 106 102  CO2 24 25 24   GLUCOSE 334* 208* 184*  BUN 18 15 12   CREATININE 0.87 0.69 0.74  CALCIUM 9.3 8.8* 8.7*   GFR: Estimated Creatinine Clearance: 49.5 mL/min (by C-G formula based on SCr of 0.74 mg/dL). Liver Function Tests: Recent Labs  Lab 08/27/20 0309 08/28/20 0339  AST 12* 14*  ALT 13 15  ALKPHOS 95 84  BILITOT 0.4 0.4  PROT 7.1 6.8  ALBUMIN 2.9* 2.7*  No results for input(s): LIPASE, AMYLASE in the last 168 hours. No results for input(s): AMMONIA in the last 168 hours. Coagulation Profile: No results for input(s): INR, PROTIME in the last 168 hours. Cardiac Enzymes: No results for input(s): CKTOTAL, CKMB, CKMBINDEX, TROPONINI in the last 168 hours. BNP (last 3 results) No results for input(s): PROBNP in the last 8760 hours. HbA1C: Recent Labs    08/27/20 1716  HGBA1C 10.1*   CBG: Recent Labs  Lab 08/27/20 1143 08/27/20 1639 08/27/20 2125 08/28/20 0730 08/28/20 1119  GLUCAP 167* 129* 235* 137* 144*   Lipid Profile: No results for input(s): CHOL, HDL, LDLCALC, TRIG, CHOLHDL, LDLDIRECT in the last 72 hours. Thyroid  Function Tests: No results for input(s): TSH, T4TOTAL, FREET4, T3FREE, THYROIDAB in the last 72 hours. Anemia Panel: No results for input(s): VITAMINB12, FOLATE, FERRITIN, TIBC, IRON, RETICCTPCT in the last 72 hours. Sepsis Labs: Recent Labs  Lab 08/27/20 1716 08/27/20 1929  LATICACIDVEN 1.6 2.4*    Recent Results (from the past 240 hour(s))  SARS Coronavirus 2 by RT PCR (hospital order, performed in [facility] hospital lab) Nasopharyngeal Nasopharyngeal Swab     Status: None   Collection Time: 08/26/20  8:56 PM   Specimen: Nasopharyngeal Swab  Result Value Ref Range Status   SARS Coronavirus 2 NEGATIVE NEGATIVE Final    Comment: (NOTE) SARS-CoV-2 target nucleic acids are NOT DETECTED.  The SARS-CoV-2 RNA is generally detectable in upper and lower respiratory specimens during the acute phase of infection. The lowest concentration of SARS-CoV-2 viral copies this assay can detect is 250 copies / mL. A negative result does not preclude SARS-CoV-2 infection and should not be used as the sole basis for treatment or other patient management decisions.  A negative result may occur with improper specimen collection / handling, submission of specimen other than nasopharyngeal swab, presence of viral mutation(s) within the areas targeted by this assay, and inadequate number of viral copies (<250 copies / mL). A negative result must be combined with clinical observations, patient history, and epidemiological information.  Fact Sheet for Patients:   StrictlyIdeas.no  Fact Sheet for Healthcare Providers: BankingDealers.co.za  This test is not yet approved or  cleared by the [place] FDA and has been authorized for detection and/or diagnosis of SARS-CoV-2 by FDA under an Emergency Use Authorization (EUA).  This EUA will remain in effect (meaning this test can be used) for the duration of the COVID-19 declaration under Section 564(b)(1)  of the Act, 21 U.S.C. section 360bbb-3(b)(1), unless the authorization is terminated or revoked sooner.  Performed at [facility], [place] [address], [place], [place] [address]   Culture, blood (routine x 2)     Status: None (Preliminary result)   Collection Time: 08/26/20  9:06 PM   Specimen: BLOOD  Result Value Ref Range Status   Specimen Description   Final    BLOOD RIGHT WRIST Performed at [facility] [address], [place], [place] [address]    Special Requests   Final    BOTTLES DRAWN AEROBIC ONLY Blood Culture results may not be optimal due to an inadequate volume of blood received in culture bottles Performed at [facility] [address] [place], [place] [address]    Culture   Final    NO GROWTH 2 DAYS Performed at [facility] [address], [place], [place] [address]    Report Status PENDING  Incomplete  Culture, blood (routine x 2)  Status: None (Preliminary result)   Collection Time: 08/26/20  9:14 PM   Specimen: BLOOD  Result Value Ref Range Status   Specimen Description   Final    BLOOD RIGHT ANTECUBITAL Performed at [facility] [address] [place], [place] [address]    Special Requests   Final    BOTTLES DRAWN AEROBIC AND ANAEROBIC Blood Culture results may not be optimal due to an excessive volume of blood received in culture bottles Performed at [facility] [address], [place], [place] [address]    Culture   Final    NO GROWTH 2 DAYS Performed at [facility] [address] [place], [place] [address]    Report Status PENDING  Incomplete         Radiology Studies: DG Chest 1 View  Result Date: 08/26/2020 CLINICAL DATA:  Lower extremity pain EXAM: CHEST  1 VIEW COMPARISON:  01/13/2019 FINDINGS: Cardiac shadow is at the upper limits of normal in size. Aortic calcifications are again noted. Calcified granuloma is noted in the  right mid lung. The lungs are well aerated bilaterally. Mild interstitial edema is seen. No bony abnormality is noted. IMPRESSION: Mild interstitial edema. Electronically Signed   By: Inez Catalina M.D.   On: 08/26/2020 21:35   DG Foot 2 Views Right  Result Date: 08/26/2020 CLINICAL DATA:  First toe infection EXAM: RIGHT FOOT - 2 VIEW COMPARISON:  None. FINDINGS: No acute fracture or dislocation is noted. No gross soft tissue abnormality is seen. Calcaneal spurring is noted. IMPRESSION: No acute abnormality seen. Electronically Signed   By: Inez Catalina M.D.   On: 08/26/2020 21:36        Scheduled Meds: . amLODipine  5 mg Oral Daily  . aspirin EC  81 mg Oral Daily  . atorvastatin  80 mg Oral Daily  . clopidogrel  75 mg Oral Daily  . enoxaparin (LOVENOX) injection  40 mg Subcutaneous Q24H  . escitalopram  10 mg Oral Daily  . feeding supplement  237 mL Oral Daily  . insulin aspart  0-15 Units Subcutaneous TID WC  . insulin aspart  0-5 Units Subcutaneous QHS  . insulin glargine  25 Units Subcutaneous Daily  . linagliptin  5 mg Oral Daily  . metroNIDAZOLE  500 mg Oral Q8H   Continuous Infusions: . ceFEPime (MAXIPIME) IV 2 g (08/28/20 1142)  . vancomycin 1,000 mg (08/27/20 2059)     LOS: 2 days    Time spent: 45 minutes spent on chart review, discussion with nursing staff, consultants, updating family and interview/physical exam; more than 50% of that time was spent in counseling and/or coordination of care.    Darnell Jeschke J [place] DO [facility] Available via Epic secure chat 7am-7pm After these hours, please refer to coverage provider listed on [email or web address] 08/28/2020, 1:20 PM

## 2020-08-29 ENCOUNTER — Encounter (HOSPITAL_BASED_OUTPATIENT_CLINIC_OR_DEPARTMENT_OTHER): Payer: Medicare Other | Admitting: Internal Medicine

## 2020-08-29 DIAGNOSIS — L03031 Cellulitis of right toe: Secondary | ICD-10-CM | POA: Diagnosis not present

## 2020-08-29 LAB — CBC
HCT: 28.3 % — ABNORMAL LOW (ref 36.0–46.0)
Hemoglobin: 8.8 g/dL — ABNORMAL LOW (ref 12.0–15.0)
MCH: 24.4 pg — ABNORMAL LOW (ref 26.0–34.0)
MCHC: 31.1 g/dL (ref 30.0–36.0)
MCV: 78.6 fL — ABNORMAL LOW (ref 80.0–100.0)
Platelets: 331 10*3/uL (ref 150–400)
RBC: 3.6 MIL/uL — ABNORMAL LOW (ref 3.87–5.11)
RDW: 15.9 % — ABNORMAL HIGH (ref 11.5–15.5)
WBC: 11.4 10*3/uL — ABNORMAL HIGH (ref 4.0–10.5)
nRBC: 0 % (ref 0.0–0.2)

## 2020-08-29 LAB — COMPREHENSIVE METABOLIC PANEL
ALT: 14 U/L (ref 0–44)
AST: 13 U/L — ABNORMAL LOW (ref 15–41)
Albumin: 2.7 g/dL — ABNORMAL LOW (ref 3.5–5.0)
Alkaline Phosphatase: 76 U/L (ref 38–126)
Anion gap: 10 (ref 5–15)
BUN: 16 mg/dL (ref 8–23)
CO2: 24 mmol/L (ref 22–32)
Calcium: 8.6 mg/dL — ABNORMAL LOW (ref 8.9–10.3)
Chloride: 103 mmol/L (ref 98–111)
Creatinine, Ser: 0.8 mg/dL (ref 0.44–1.00)
GFR, Estimated: >60 mL/min (ref >60)
Glucose, Bld: 116 mg/dL — ABNORMAL HIGH (ref 70–99)
Potassium: 4.1 mmol/L (ref 3.5–5.1)
Sodium: 137 mmol/L (ref 135–145)
Total Bilirubin: 0.5 mg/dL (ref 0.3–1.2)
Total Protein: 6.8 g/dL (ref 6.5–8.1)

## 2020-08-29 LAB — GLUCOSE, CAPILLARY
Glucose-Capillary: 117 mg/dL — ABNORMAL HIGH (ref 70–99)
Glucose-Capillary: 173 mg/dL — ABNORMAL HIGH (ref 70–99)
Glucose-Capillary: 181 mg/dL — ABNORMAL HIGH (ref 70–99)
Glucose-Capillary: 112 mg/dL — ABNORMAL HIGH (ref 70–99)

## 2020-08-29 MED ORDER — DOXYCYCLINE HYCLATE 100 MG PO TABS
100.0000 mg | ORAL_TABLET | Freq: Two times a day (BID) | ORAL | Status: DC
  Administered 2020-08-29 – 2020-09-06 (×16): 100 mg via ORAL
  Filled 2020-08-29 (×17): qty 1

## 2020-08-29 MED ORDER — AMOXICILLIN-POT CLAVULANATE 875-125 MG PO TABS
1.0000 | ORAL_TABLET | Freq: Two times a day (BID) | ORAL | Status: DC
  Administered 2020-08-29 – 2020-09-06 (×16): 1 via ORAL
  Filled 2020-08-29 (×16): qty 1

## 2020-08-29 MED ORDER — GABAPENTIN 300 MG PO CAPS
300.0000 mg | ORAL_CAPSULE | Freq: Three times a day (TID) | ORAL | Status: DC
  Administered 2020-08-29 – 2020-09-06 (×23): 300 mg via ORAL
  Filled 2020-08-29 (×22): qty 1

## 2020-08-29 NOTE — Evaluation (Signed)
Physical Therapy Evaluation Patient Details Name: Crystal Moore MRN: [id number] DOB: 10/07/1945 Today's Date: 08/29/2020   History of Present Illness  Crystal Moore is a 75 year old female with past medical history significant for type 2 diabetes mellitus, peripheral vascular disease with vascular ulcer of left lateral ankle, history of CVA, essential hypertension, dementia, HLD, gout, OSA who presents to ED with right foot redness and darkening of the right great/second toe.  Bilateral lower extremity ischemia with bilateral foot ulceration.  Vascular surgery involved and plan for arteriography for tomorrow (08/29/20) and possible intervention.  Clinical Impression  Pt admitted with above diagnosis.  Pt currently with functional limitations due to the deficits listed below (see PT Problem List). Pt will benefit from skilled PT to increase their independence and safety with mobility to allow discharge to the venue listed below.  Pt reports Bil LE pain with movement.  Pt requiring mod assist for bed mobility and sat EOB for 15 minutes to eat lunch.  Pt declined scooting over to drop arm recliner and reluctant to stand due to fear of falling.  Pt pending further plan of care with vascular surgery.  Recommend SNF upon d/c unless family feels they can provide current physical assist (no family present at time of PT evaluation).     Follow Up Recommendations SNF    Equipment Recommendations  None recommended by PT    Recommendations for Other Services       Precautions / Restrictions Precautions Precautions: Fall Precaution Comments: arterial insufficiency ulcerations Restrictions Weight Bearing Restrictions: No      Mobility  Bed Mobility Overal bed mobility: Needs Assistance Bed Mobility: Supine to Sit;Sit to Supine     Supine to sit: Mod assist Sit to supine: Mod assist   General bed mobility comments: pt self assists Lt LE, assist for scooting to EOB and initial trunk support upon  sitting; assist for bil LEs onto bed, pt required total assist for repositioning in supine    Transfers Overall transfer level: Needs assistance   Transfers: Sit to/from Stand;Stand Pivot Transfers Sit to Stand: Mod assist Stand pivot transfers: Mod assist       General transfer comment: pt declined due to fear of falling  Ambulation/Gait                Stairs            Wheelchair Mobility    Modified Rankin (Stroke Patients Only)       Balance Overall balance assessment: Needs assistance Sitting-balance support: No upper extremity supported Sitting balance-Leahy Scale: Fair Sitting balance - Comments: pt able to sit EOB at least 15 minutes and eat lunch     Standing balance-Leahy Scale: Poor Standing balance comment: Posterior lean, external support needed                             Pertinent Vitals/Pain Pain Assessment: Faces Faces Pain Scale: Hurts even more Pain Location: L lower leg Pain Descriptors / Indicators: Grimacing Pain Intervention(s): Repositioned;Monitored during session    Home Living Family/patient expects to be discharged to:: Private residence Living Arrangements: Children (daughter) Available Help at Discharge: Family;Available 24 hours/day Type of Home: House Home Access: Stairs to enter   [employer] of Steps: 1 Home Layout: One level Home Equipment: Cane - single point;Wheelchair - manual      Prior Function Level of Independence: Needs assistance   Gait / Transfers Assistance Needed: hand  hold or cane, daughter reports steadying her or holding on to her if needed at baseline to walk to the bathroom.  ADL's / Homemaking Assistance Needed: Needs assistance, has 24/7 assistance and an aide 8 hrs a day  Comments: above per OT, daughter not present for PT session     Hand Dominance        Extremity/Trunk Assessment   Upper Extremity Assessment Upper Extremity Assessment: RUE  deficits/detail;LUE deficits/detail RUE Deficits / Details: WFL ROM and strength LUE Deficits / Details: Left upper extremity adducted iwth elbow, wrist and fingers flexed. PROM of shoulder to 70 degrees flexion, elbow lacking 45 degrees extension, wrist barely to neutral nearing end range and patient grimacing, fingers could grossly be opened. LUE Coordination: decreased gross motor;decreased fine motor    Lower Extremity Assessment Lower Extremity Assessment: Generalized weakness;LLE deficits/detail;RLE deficits/detail RLE Deficits / Details: Dry gangrene right great/second toe per chart LLE Deficits / Details: residual deficits after stroke, L lateral malleolus arterial wound, also with left knee flexion contracture    Cervical / Trunk Assessment Cervical / Trunk Assessment: Normal  Communication   Communication: Expressive difficulties  Cognition Arousal/Alertness: Awake/alert Behavior During Therapy: WFL for tasks assessed/performed Overall Cognitive Status: Difficult to assess                                 General Comments: presents with expressive difficulty and very HOH, improved with speaking into left ear and using yes/no questions      General Comments      Exercises     Assessment/Plan    PT Assessment Patient needs continued PT services  PT Problem List Decreased strength;Decreased mobility;Decreased activity tolerance;Decreased balance;Decreased knowledge of use of DME;Decreased skin integrity       PT Treatment Interventions Gait training;DME instruction;Therapeutic exercise;Balance training;Functional mobility training;Therapeutic activities;Patient/family education;Wheelchair mobility training    PT Goals (Current goals can be found in the Care Plan section)  Acute Rehab PT Goals Patient Stated Goal: Walk again PT Goal Formulation: With patient Time For Goal Achievement: 09/12/20 Potential to Achieve Goals: Fair    Frequency Min  2X/week   Barriers to discharge        Co-evaluation               AM-PAC PT "6 Clicks" Mobility  Outcome Measure Help needed turning from your back to your side while in a flat bed without using bedrails?: A Lot Help needed moving from lying on your back to sitting on the side of a flat bed without using bedrails?: A Lot Help needed moving to and from a bed to a chair (including a wheelchair)?: A Lot Help needed standing up from a chair using your arms (e.g., wheelchair or bedside chair)?: A Lot Help needed to walk in hospital room?: Total Help needed climbing 3-5 steps with a railing? : Total 6 Click Score: 10    End of Session   Activity Tolerance: Patient tolerated treatment well Patient left: in bed;with call bell/phone within reach;with bed alarm set   PT Visit Diagnosis: Other abnormalities of gait and mobility (R26.89)    Time: 7591-6384 PT Time Calculation (min) (ACUTE ONLY): 41 min   Charges:   PT Evaluation $PT Eval Low Complexity: 1 Low PT Treatments $Therapeutic Activity: 8-22 mins   Jannette Spanner PT, DPT Acute Rehabilitation Services Pager: [phone] Office: [phone]  York Ram E 08/29/2020, 3:16 PM

## 2020-08-29 NOTE — Plan of Care (Signed)
  Problem: Skin Integrity: Goal: Risk for impaired skin integrity will decrease Outcome: Progressing   Problem: Metabolic: Goal: Ability to maintain appropriate glucose levels will improve Outcome: Progressing   Problem: Coping: Goal: Ability to adjust to condition or change in health will improve Outcome: Progressing

## 2020-08-29 NOTE — Progress Notes (Signed)
PROGRESS NOTE    Crystal Moore  DEY:[id number] DOB: [birth year] DOA: 08/26/2020 PCP: [name], MD    Brief Narrative:  Crystal Moore is a 75 year old female with past medical history significant for type 2 diabetes mellitus, peripheral vascular disease with vascular ulcer of left lateral ankle, history of CVA, essential hypertension, dementia, HLD, gout, OSA who presents to ED with right foot redness and darkening of the right great/second toe.  Patient's followed outpatient by wound center and recent ABIs showing severe peripheral vascular disease in both legs.  Patient pending referral to vascular surgery outpatient.  In the ED, temperature 100.2, BP 156/89, HR 114, RR 23, SPO2 90% on room air.  WBC 15.1, hemoglobin 9.7.  X-ray right foot with no acute finding.  Chest x-ray with no acute cardiopulmonary disease process.  Recent ABI roughly 1 week ago with significant peripheral vascular disease and arterial occlusive disease in bilateral lower extremities with ABIs of 0.50 and 0.57 on the right and left respectively.  Patient was admitted to the hospitalist service for further evaluation and treatment of progressive peripheral vascular disease and cellulitis.   Assessment & Plan:   Principal Problem:   Cellulitis of great toe of right foot Active Problems:   COPD (chronic obstructive pulmonary disease) (HCC)   ILD (interstitial lung disease) (HCC)   Sepsis (HCC)   Hypertension   Type 2 diabetes mellitus with vascular disease (HCC)   Ankle ulcer (HCC)   Sepsis, POA Right foot cellulitis Patient presenting with pain, erythema to right foot.  Was noted to be tachycardic with elevated temperature, elevated WBC count and source of right foot infection.  X-ray right foot with no acute process. --WBC 15.1>12.6>14.3>11.4 --Continue empiric antibiotics with vancomycin, cefepime, Flagyl  Severe bilateral peripheral vascular disease with ischemia Dry gangrene right  great/second toe, POA Left lateral ankle ulcer, POA Patient presenting with progressive discoloration to right great/second toe.  Recent ABI with severe peripheral vascular disease/peripheral artery disease.  Patient was seen by vascular surgery, Dr. Donnetta Hutching on 08/27/2020.  Dr. Really believes there is no role for aggressive revascularization attempts due to her nonambulatory status and recommends amputation if she was having pain or progressive tissue loss. --Aspirin 81 mg daily, Plavix 75 mg p.o. daily, atorvastatin 80 mg p.o. daily --Vascular surgery plans arteriogram on 08/29/2020, transferring to [facility]  Type 2 diabetes mellitus, uncontrolled with hyperglycemia Hemoglobin A1c 10.1, poorly controlled.  Home regimen includes Tresiba 25 unit subcutaneously daily, Tradjenta 5 mg p.o. daily. --Lantus 25 unit subcutaneously daily --Tradjenta 5 mg p.o. daily --Moderate insulin sliding scale for further coverage --CBGs QAC/HS  Essential hypertension --Amlodipine 5 mg p.o. daily  Hyperlipidemia: Atorvastatin 80 mg p.o. daily  Anxiety/depression: Lexapro 10 mg p.o. daily  Hx ILD/COPD --Albuterol MDI as needed  Dementia --Delirium precautions --Get up during the day --Encourage a familiar face to remain present throughout the day --Keep blinds open and lights on during daylight hours --Minimize the use of opioids/benzodiazepines    DVT prophylaxis: Lovenox   Code Status: Full Code Family Communication: Updated patient's daughter at bedside  Disposition Plan:  Level of care: Med-Surg Status is: Inpatient  Remains inpatient appropriate because:Ongoing diagnostic testing needed not appropriate for outpatient work up, Unsafe d/c plan, IV treatments appropriate due to intensity of illness or inability to take PO and Inpatient level of care appropriate due to severity of illness   Dispo: The patient is from: Home  Anticipated d/c is to: Home               Anticipated d/c date is: 2 days              Patient currently is not medically stable to d/c.   Difficult to place patient No   Consultants:   Vascular surgery, Dr. Donnetta Hutching  Procedures:   None  Antimicrobials:   Vancomycin 1/31 - 2/3  Cefepime 1/31 - 2/3  Flagyl 2/2 - 2/3  Doxycycline 2/3>>  Augmentin 2/3>>   Subjective: Patient seen and examined at bedside, resting comfortably; lying in bed.  Continues to be pleasantly confused.  Daughter present.  Discussed with vascular surgery, Dr. Donnetta Hutching this morning, plan to transfer to [facility] for planned arteriogram tomorrow.  No concerns per nursing staff this morning.  Objective: Vitals:   08/28/20 1307 08/28/20 2150 08/28/20 2343 08/29/20 0516  BP: (!) 160/63 (!) 139/59 (!) 125/54 (!) 149/72  Pulse: 86 91 76 71  Resp: 18 18 18 16   Temp: 98.4 F (36.9 C) 99.9 F (37.7 C) 100.1 F (37.8 C) 97.7 F (36.5 C)  TempSrc: Oral Axillary Oral Oral  SpO2: 90% 92% 100% 95%  Weight:      Height:        Intake/Output Summary (Last 24 hours) at 08/29/2020 1205 Last data filed at 08/29/2020 0200 Gross per 24 hour  Intake 540 ml  Output 150 ml  Net 390 ml   Filed Weights   08/26/20 2003 08/27/20 0035  Weight: 61.2 kg 57.5 kg    Examination:  General exam: Appears calm and comfortable, pleasantly confused Respiratory system: Clear to auscultation. Respiratory effort normal.  Oxygenating well on room air Cardiovascular system: S1 & S2 heard, RRR. No JVD, murmurs, rubs, gallops or clicks. No pedal edema. Gastrointestinal system: Abdomen is nondistended, soft and nontender. No organomegaly or masses felt. Normal bowel sounds heard. Central nervous system: Alert, not oriented to person/place/time/situation. No focal neurological deficits. Extremities: Moves all extremities independently Skin: Bullous lesion noted to right great/second toe with surrounding erythema, left lateral malleolus wound Psychiatry: Judgement and insight  appear poor. Mood & affect appropriate.         Data Reviewed: I have personally reviewed following labs and imaging studies  CBC: Recent Labs  Lab 08/26/20 2034 08/27/20 0309 08/28/20 0339 08/29/20 0322  WBC 15.1* 12.6* 14.3* 11.4*  NEUTROABS 12.2*  --   --   --   HGB 9.7* 9.1* 8.9* 8.8*  HCT 30.8* 29.6* 28.7* 28.3*  MCV 78.6* 79.8* 79.5* 78.6*  PLT 389 327 335 400   Basic Metabolic Panel: Recent Labs  Lab 08/26/20 2034 08/27/20 0309 08/28/20 0339 08/29/20 0322  NA 136 139 135 137  K 4.0 3.6 4.3 4.1  CL 100 106 102 103  CO2 24 25 24 24   GLUCOSE 334* 208* 184* 116*  BUN 18 15 12 16   CREATININE 0.87 0.69 0.74 0.80  CALCIUM 9.3 8.8* 8.7* 8.6*   GFR: Estimated Creatinine Clearance: 49.5 mL/min (by C-G formula based on SCr of 0.8 mg/dL). Liver Function Tests: Recent Labs  Lab 08/27/20 0309 08/28/20 0339 08/29/20 0322  AST 12* 14* 13*  ALT 13 15 14   ALKPHOS 95 84 76  BILITOT 0.4 0.4 0.5  PROT 7.1 6.8 6.8  ALBUMIN 2.9* 2.7* 2.7*   No results for input(s): LIPASE, AMYLASE in the last 168 hours. No results for input(s): AMMONIA in the last 168 hours. Coagulation Profile: No results for input(s):  INR, PROTIME in the last 168 hours. Cardiac Enzymes: No results for input(s): CKTOTAL, CKMB, CKMBINDEX, TROPONINI in the last 168 hours. BNP (last 3 results) No results for input(s): PROBNP in the last 8760 hours. HbA1C: Recent Labs    08/27/20 1716  HGBA1C 10.1*   CBG: Recent Labs  Lab 08/28/20 0730 08/28/20 1119 08/28/20 1633 08/28/20 2152 08/29/20 0732  GLUCAP 137* 144* 139* 71 117*   Lipid Profile: No results for input(s): CHOL, HDL, LDLCALC, TRIG, CHOLHDL, LDLDIRECT in the last 72 hours. Thyroid Function Tests: No results for input(s): TSH, T4TOTAL, FREET4, T3FREE, THYROIDAB in the last 72 hours. Anemia Panel: No results for input(s): VITAMINB12, FOLATE, FERRITIN, TIBC, IRON, RETICCTPCT in the last 72 hours. Sepsis Labs: Recent Labs  Lab  08/27/20 1716 08/27/20 1929  LATICACIDVEN 1.6 2.4*    Recent Results (from the past 240 hour(s))  SARS Coronavirus 2 by RT PCR (hospital order, performed in [facility] hospital lab) Nasopharyngeal Nasopharyngeal Swab     Status: None   Collection Time: 08/26/20  8:56 PM   Specimen: Nasopharyngeal Swab  Result Value Ref Range Status   SARS Coronavirus 2 NEGATIVE NEGATIVE Final    Comment: (NOTE) SARS-CoV-2 target nucleic acids are NOT DETECTED.  The SARS-CoV-2 RNA is generally detectable in upper and lower respiratory specimens during the acute phase of infection. The lowest concentration of SARS-CoV-2 viral copies this assay can detect is 250 copies / mL. A negative result does not preclude SARS-CoV-2 infection and should not be used as the sole basis for treatment or other patient management decisions.  A negative result may occur with improper specimen collection / handling, submission of specimen other than nasopharyngeal swab, presence of viral mutation(s) within the areas targeted by this assay, and inadequate number of viral copies (<250 copies / mL). A negative result must be combined with clinical observations, patient history, and epidemiological information.  Fact Sheet for Patients:   StrictlyIdeas.no  Fact Sheet for Healthcare Providers: BankingDealers.co.za  This test is not yet approved or  cleared by the [place] FDA and has been authorized for detection and/or diagnosis of SARS-CoV-2 by FDA under an Emergency Use Authorization (EUA).  This EUA will remain in effect (meaning this test can be used) for the duration of the COVID-19 declaration under Section 564(b)(1) of the Act, 21 U.S.C. section 360bbb-3(b)(1), unless the authorization is terminated or revoked sooner.  Performed at [facility], [place] [address], [place], [place] [address]   Culture, blood (routine x 2)     Status: None  (Preliminary result)   Collection Time: 08/26/20  9:06 PM   Specimen: BLOOD  Result Value Ref Range Status   Specimen Description   Final    BLOOD RIGHT WRIST Performed at [facility] [address], [place], [place] [address]    Special Requests   Final    BOTTLES DRAWN AEROBIC ONLY Blood Culture results may not be optimal due to an inadequate volume of blood received in culture bottles Performed at [facility] [address], [place], [place] [address]    Culture   Final    NO GROWTH 3 DAYS Performed at [facility], [place] [address], [place], [place] [address]    Report Status PENDING  Incomplete  Culture, blood (routine x 2)     Status: None (Preliminary result)   Collection Time: 08/26/20  9:14 PM   Specimen: BLOOD  Result Value Ref Range Status  Specimen Description   Final    BLOOD RIGHT ANTECUBITAL Performed at [facility] [address] [place], [place] [address]    Special Requests   Final    BOTTLES DRAWN AEROBIC AND ANAEROBIC Blood Culture results may not be optimal due to an excessive volume of blood received in culture bottles Performed at [facility] [address] [place], [place] [address]    Culture   Final    NO GROWTH 3 DAYS Performed at [facility], [place] [address] [place], [place] [address]    Report Status PENDING  Incomplete         Radiology Studies: No results found.      Scheduled Meds: . amLODipine  5 mg Oral Daily  . amoxicillin-clavulanate  1 tablet Oral Q12H  . aspirin EC  81 mg Oral Daily  . atorvastatin  80 mg Oral Daily  . clopidogrel  75 mg Oral Daily  . doxycycline  100 mg Oral Q12H  . enoxaparin (LOVENOX) injection  40 mg Subcutaneous Q24H  . escitalopram  10 mg Oral Daily  . feeding supplement  237 mL Oral Daily  . insulin aspart  0-15 Units Subcutaneous TID WC  . insulin aspart  0-5 Units Subcutaneous QHS  . insulin  glargine  25 Units Subcutaneous Daily  . linagliptin  5 mg Oral Daily   Continuous Infusions:    LOS: 3 days    Time spent: 41 minutes spent on chart review, discussion with nursing staff, consultants, updating family and interview/physical exam; more than 50% of that time was spent in counseling and/or coordination of care.    Tywanna Seifer J [place] DO [facility] Available via Epic secure chat 7am-7pm After these hours, please refer to coverage provider listed on [email or web address] 08/29/2020, 12:05 PM

## 2020-08-29 NOTE — Evaluation (Signed)
Occupational Therapy Evaluation Patient Details Name: Crystal Moore MRN: [id number] DOB: [birth year] Today's Date: 08/29/2020    History of Present Illness Crystal Moore is a 75 year old female with past medical history significant for type 2 diabetes mellitus, peripheral vascular disease with vascular ulcer of left lateral ankle, history of CVA, essential hypertension, dementia, HLD, gout, OSA who presents to ED with right foot redness and darkening of the right great/second toe.   Clinical Impression   Crystal Moore is a 75 year old woman with above medical history and admitted with sepsis secondary to right foot infection. Patient's daughter reports patient typically able to ambulate with assistance and needing assistance with all ADLs.On evaluation patient presents with left sided hemiparesis from prior stroke with increased tonicity with a flexed UE position and non functional use of LUE. Patient also exhibits increased tonicity in LLE with decreased ROM and motor control but more functional than upper extremity. Patient is able to state her name and that she is in the hospital but otherwise nods or shakes her head due to expressive difficulties and is hard of hearing. On evaluation patient needs set up for grooming and feeding and mod assist UB ADLs and max-total assist for LB ADLs and toileting.  Today patient mod assist to transfer into sitting and mod assist for standing. Transfers and ambulation today impeded by bursting foot wound and patient returned to bed in order to manage wound and drainage. Patient able to take two steps with mod assist from therapist - grimacing with discomfort with weight bearing. Patient will benefit from skilled OT services while in hospital to improve functional mobility and maintain participation in ADLs in order for patient to return home at discharge.   Follow Up Recommendations  No OT follow up    Equipment Recommendations  None recommended by OT     Recommendations for Other Services       Precautions / Restrictions Precautions Precautions: Fall Precaution Comments: foot wounds Restrictions Weight Bearing Restrictions: No      Mobility Bed Mobility Overal bed mobility: Needs Assistance Bed Mobility: Supine to Sit;Sit to Supine     Supine to sit: Mod assist;HOB elevated Sit to supine: Min assist   General bed mobility comments: Mod assist for hand hold and assistance with LLE to transfer into sitting. Min assist to return to supine to guide back into bed.    Transfers Overall transfer level: Needs assistance   Transfers: Sit to/from Stand;Stand Pivot Transfers Sit to Stand: Mod assist Stand pivot transfers: Mod assist            Balance Overall balance assessment: Needs assistance Sitting-balance support: No upper extremity supported Sitting balance-Leahy Scale: Fair       Standing balance-Leahy Scale: Poor Standing balance comment: Posterior lean, external support needed                           ADL either performed or assessed with clinical judgement   ADL Overall ADL's : Needs assistance/impaired Eating/Feeding: Set up   Grooming: Set up Grooming Details (indicate cue type and reason): set up to wash face and apply lotion Upper Body Bathing: Moderate assistance;Sitting   Lower Body Bathing: Moderate assistance;Cueing for sequencing;Sitting/lateral leans   Upper Body Dressing : Moderate assistance;Sitting   Lower Body Dressing: Total assistance;Sit to/from stand Lower Body Dressing Details (indicate cue type and reason): to don socks Toilet Transfer: Moderate assistance;Stand-pivot;BSC   Toileting- Clothing Manipulation and  Hygiene: Sit to/from stand;Maximal assistance               Vision   Vision Assessment?: No apparent visual deficits     Perception     Praxis      Pertinent Vitals/Pain Pain Assessment: Faces Faces Pain Scale: Hurts little more Pain Location: R  foot Pain Descriptors / Indicators: Grimacing Pain Intervention(s): Monitored during session;Repositioned     Hand Dominance     Extremity/Trunk Assessment Upper Extremity Assessment Upper Extremity Assessment: RUE deficits/detail;LUE deficits/detail RUE Deficits / Details: WFL ROM and strength LUE Deficits / Details: Left upper extremity adducted iwth elbow, wrist and fingers flexed. PROM of shoulder to 70 degrees flexion, elbow lacking 45 degrees extension, wrist barely to neutral nearing end range and patient grimacing, fingers could grossly be opened. LUE Coordination: decreased gross motor;decreased fine motor   Lower Extremity Assessment Lower Extremity Assessment: Defer to PT evaluation   Cervical / Trunk Assessment Cervical / Trunk Assessment: Normal   Communication Communication Communication: Expressive difficulties   Cognition Arousal/Alertness: Awake/alert Behavior During Therapy: WFL for tasks assessed/performed Overall Cognitive Status: Difficult to assess                                 General Comments: limited by expressive difficulties.   General Comments       Exercises     Shoulder Instructions      Home Living Family/patient expects to be discharged to:: Private residence Living Arrangements: Children (daughter) Available Help at Discharge: Family;Available 24 hours/day Type of Home: House Home Access: Stairs to enter [employer] of Steps: 1   Home Layout: One level         Biochemist, clinical: [place] - single point;Wheelchair - manual          Prior Functioning/Environment Level of Independence: Needs assistance  Gait / Transfers Assistance Needed: hand hold or cane, daughter reports steadying her or holding on to her if needed at baseline to walk to the bathroom. ADL's / Homemaking Assistance Needed: Needs assistance, has 24/7 assistance and an aide 8 hrs a day Communication /  Swallowing Assistance Needed: expressive difficulties. Minimal conversation. Able to state her name and "hospital" when asked where she is. Predominantly nods/shakes head          OT Problem List: Decreased strength;Decreased range of motion;Impaired balance (sitting and/or standing);Decreased knowledge of use of DME or AE;Pain;Impaired UE functional use;Decreased cognition;Decreased safety awareness      OT Treatment/Interventions: Self-care/ADL training;Therapeutic exercise;DME and/or AE instruction;Therapeutic activities;Balance training;Patient/family education    OT Goals(Current goals can be found in the care plan section) Acute Rehab OT Goals Patient Stated Goal: Walk again OT Goal Formulation: With family Time For Goal Achievement: 09/12/20 Potential to Achieve Goals: Fair  OT Frequency: Min 2X/week   Barriers to D/C:            Co-evaluation              AM-PAC OT "6 Clicks" Daily Activity     Outcome Measure Help from another person eating meals?: A Little Help from another person taking care of personal grooming?: A Little Help from another person toileting, which includes using toliet, bedpan, or urinal?: A Lot Help from another person bathing (including washing, rinsing, drying)?: A Lot Help from another person to put on and taking off regular upper body clothing?: A Lot Help  from another person to put on and taking off regular lower body clothing?: Total 6 Click Score: 13   End of Session Equipment Utilized During Treatment: Gait belt Nurse Communication: Mobility status  Activity Tolerance: Patient tolerated treatment well Patient left: in bed;with call bell/phone within reach;with family/visitor present  OT Visit Diagnosis: Unsteadiness on feet (R26.81);Other abnormalities of gait and mobility (R26.89);Pain                Time: 6606-3016 OT Time Calculation (min): 28 min Charges:  OT General Charges $OT Visit: 1 Visit OT Evaluation $OT Eval  Moderate Complexity: 1 Mod OT Treatments $Self Care/Home Management : 8-22 mins  Joanthan Hlavacek, OTR/L [place]  Office [phone] Pager: [place] 08/29/2020, 12:24 PM

## 2020-08-29 NOTE — Progress Notes (Signed)
Patient ID: Crystal Moore, female   DOB: 17-Nov-1945, 75 y.o.   MRN: [id number] I discussed the patient's addition with her daughter. My understanding was that the patient was nonambulatory. Her daughter insisted that she is able to walk and is able to get in and out of a car with assistance. She has had a stroke with a knee contracture but reports that despite this she is able to get around. I explained that this changes my recommendation from observation depends an eventual primary amputation. I would recommend arteriography for further evaluation. May have endovascular treatment options. Would still be somewhat hesitant to proceed with major open surgical treatment in her debilitated state. I discussed this by telephone with Dr.[place]. The patient will be transferred to [facility] to the hospitalist service. We will plan arteriography for tomorrow and possible intervention

## 2020-08-30 ENCOUNTER — Other Ambulatory Visit: Payer: Self-pay

## 2020-08-30 ENCOUNTER — Ambulatory Visit (HOSPITAL_COMMUNITY): Admission: RE | Admit: 2020-08-30 | Payer: Medicare Other | Source: Home / Self Care

## 2020-08-30 ENCOUNTER — Encounter (HOSPITAL_COMMUNITY): Payer: Self-pay | Admitting: Vascular Surgery

## 2020-08-30 ENCOUNTER — Encounter (HOSPITAL_COMMUNITY)
Admission: EM | Disposition: A | Discharge status: Home Health | Payer: Self-pay | Source: Home / Self Care | Attending: Internal Medicine

## 2020-08-30 DIAGNOSIS — L03031 Cellulitis of right toe: Secondary | ICD-10-CM | POA: Diagnosis not present

## 2020-08-30 DIAGNOSIS — I70203 Unspecified atherosclerosis of native arteries of extremities, bilateral legs: Secondary | ICD-10-CM

## 2020-08-30 HISTORY — PX: ABDOMINAL AORTOGRAM W/LOWER EXTREMITY: CATH118223

## 2020-08-30 HISTORY — PX: PERIPHERAL VASCULAR INTERVENTION: CATH118257

## 2020-08-30 LAB — GLUCOSE, CAPILLARY
Glucose-Capillary: 91 mg/dL (ref 70–99)
Glucose-Capillary: 118 mg/dL — ABNORMAL HIGH (ref 70–99)
Glucose-Capillary: 239 mg/dL — ABNORMAL HIGH (ref 70–99)
Glucose-Capillary: 206 mg/dL — ABNORMAL HIGH (ref 70–99)

## 2020-08-30 SURGERY — ABDOMINAL AORTOGRAM W/LOWER EXTREMITY
Anesthesia: LOCAL | Laterality: Right

## 2020-08-30 MED ORDER — ACETAMINOPHEN 325 MG PO TABS: 650.0000 mg | ORAL_TABLET | ORAL | Status: DC | PRN

## 2020-08-30 MED ORDER — HYDRALAZINE HCL 20 MG/ML IJ SOLN: 5.0000 mg | INTRAMUSCULAR | Status: DC | PRN

## 2020-08-30 MED ORDER — IODIXANOL 320 MG/ML IV SOLN
INTRAVENOUS | Status: DC | PRN
  Administered 2020-08-30: 165 mL via INTRA_ARTERIAL

## 2020-08-30 MED ORDER — HEPARIN (PORCINE) IN NACL 1000-0.9 UT/500ML-% IV SOLN
INTRAVENOUS | Status: AC
  Filled 2020-08-30: qty 1000

## 2020-08-30 MED ORDER — CLOPIDOGREL BISULFATE 75 MG PO TABS
300.0000 mg | ORAL_TABLET | Freq: Once | ORAL | Status: AC
  Administered 2020-08-30: 300 mg via ORAL
  Filled 2020-08-30: qty 4

## 2020-08-30 MED ORDER — HEPARIN (PORCINE) IN NACL 2000-0.9 UNIT/L-% IV SOLN: INTRAVENOUS | Status: DC | PRN

## 2020-08-30 MED ORDER — LIDOCAINE HCL (PF) 1 % IJ SOLN
INTRAMUSCULAR | Status: AC
  Filled 2020-08-30: qty 30

## 2020-08-30 MED ORDER — SODIUM CHLORIDE 0.9 % IV SOLN: Freq: Once | INTRAVENOUS | Status: AC

## 2020-08-30 MED ORDER — SODIUM CHLORIDE 0.9% FLUSH
3.0000 mL | Freq: Two times a day (BID) | INTRAVENOUS | Status: DC
  Administered 2020-08-31 – 2020-09-03 (×8): 3 mL via INTRAVENOUS

## 2020-08-30 MED ORDER — CLOPIDOGREL BISULFATE 75 MG PO TABS
75.0000 mg | ORAL_TABLET | Freq: Every day | ORAL | Status: DC
  Administered 2020-08-31 – 2020-09-06 (×7): 75 mg via ORAL
  Filled 2020-08-30 (×7): qty 1

## 2020-08-30 MED ORDER — ASPIRIN EC 81 MG PO TBEC: 81.0000 mg | DELAYED_RELEASE_TABLET | Freq: Every day | ORAL | Status: DC

## 2020-08-30 MED ORDER — CLOPIDOGREL BISULFATE 75 MG PO TABS
ORAL_TABLET | ORAL | Status: AC
  Filled 2020-08-30: qty 4

## 2020-08-30 MED ORDER — SODIUM CHLORIDE 0.9 % IV SOLN: 250.0000 mL | INTRAVENOUS | Status: DC | PRN

## 2020-08-30 MED ORDER — HEPARIN (PORCINE) IN NACL 1000-0.9 UT/500ML-% IV SOLN
INTRAVENOUS | Status: DC | PRN
  Administered 2020-08-30 (×2): 500 mL

## 2020-08-30 MED ORDER — SODIUM CHLORIDE 0.9 % WEIGHT BASED INFUSION: 1.0000 mL/kg/h | INTRAVENOUS | Status: AC

## 2020-08-30 MED ORDER — SODIUM CHLORIDE 0.9% FLUSH: 3.0000 mL | INTRAVENOUS | Status: DC | PRN

## 2020-08-30 MED ORDER — LABETALOL HCL 5 MG/ML IV SOLN: 10.0000 mg | INTRAVENOUS | Status: DC | PRN

## 2020-08-30 MED ORDER — ASPIRIN EC 81 MG PO TBEC
81.0000 mg | DELAYED_RELEASE_TABLET | Freq: Every day | ORAL | Status: DC
  Administered 2020-08-30 – 2020-09-06 (×8): 81 mg via ORAL
  Filled 2020-08-30 (×8): qty 1

## 2020-08-30 MED ORDER — ONDANSETRON HCL 4 MG/2ML IJ SOLN: 4.0000 mg | Freq: Four times a day (QID) | INTRAMUSCULAR | Status: DC | PRN

## 2020-08-30 MED ORDER — HEPARIN SODIUM (PORCINE) 1000 UNIT/ML IJ SOLN
INTRAMUSCULAR | Status: AC
  Filled 2020-08-30: qty 1

## 2020-08-30 MED ORDER — HEPARIN SODIUM (PORCINE) 1000 UNIT/ML IJ SOLN
INTRAMUSCULAR | Status: DC | PRN
  Administered 2020-08-30: 5000 [IU] via INTRAVENOUS

## 2020-08-30 MED ORDER — LIDOCAINE HCL (PF) 1 % IJ SOLN
INTRAMUSCULAR | Status: DC | PRN
  Administered 2020-08-30: 18 mL via INTRADERMAL

## 2020-08-30 SURGICAL SUPPLY — 23 items
BAG SNAP BAND KOVER 36X36 (MISCELLANEOUS) ×3 IMPLANT
BALLN MUSTANG 5X150X135 (BALLOONS) ×3
BALLOON MUSTANG 5X150X135 (BALLOONS) ×2 IMPLANT
CATH OMNI FLUSH 5F 65CM (CATHETERS) ×3 IMPLANT
CATH QUICKCROSS SUPP .035X90CM (MICROCATHETER) ×3 IMPLANT
CLOSURE PERCLOSE PROSTYLE (VASCULAR PRODUCTS) ×3 IMPLANT
DEVICE TORQUE H2O (MISCELLANEOUS) ×3 IMPLANT
GLIDEWIRE ANGLED SS 035X260CM (WIRE) ×3 IMPLANT
KIT ENCORE 26 ADVANTAGE (KITS) ×3 IMPLANT
KIT MICROPUNCTURE NIT STIFF (SHEATH) ×3 IMPLANT
KIT PV (KITS) ×3 IMPLANT
SHEATH FLEX ANSEL ANG 6F 45CM (SHEATH) ×3 IMPLANT
SHEATH PINNACLE 5F 10CM (SHEATH) ×3 IMPLANT
SHEATH PINNACLE 6F 10CM (SHEATH) ×3 IMPLANT
SHEATH PINNACLE ST 6F 45CM (SHEATH) ×3 IMPLANT
SHEATH PROBE COVER 6X72 (BAG) ×3 IMPLANT
STENT ELUVIA 6X100X130 (Permanent Stent) ×3 IMPLANT
STENT ELUVIA 6X120X130 (Permanent Stent) ×6 IMPLANT
SYR MEDRAD MARK 7 150ML (SYRINGE) ×3 IMPLANT
TRANSDUCER W/STOPCOCK (MISCELLANEOUS) ×3 IMPLANT
TRAY PV CATH (CUSTOM PROCEDURE TRAY) ×3 IMPLANT
WIRE STARTER BENTSON 035X150 (WIRE) ×3 IMPLANT
WIRE TORQFLEX AUST .018X40CM (WIRE) ×3 IMPLANT

## 2020-08-30 NOTE — Op Note (Signed)
DATE OF SERVICE: 08/30/2020  PATIENT:  Crystal Moore  75 y.o. female  PRE-OPERATIVE DIAGNOSIS:  Atherosclerosis of native arteries of bilateral lower extremities with tissue loss  POST-OPERATIVE DIAGNOSIS:  Same  PROCEDURE:   1) US guided LCFA access 2) Aortogram 3) RLE angiogram with third order cannulation (157mL contrast) 4) R SFA / popliteal stenting (6x122mm Eluvia x 3)   SURGEON:  Surgeon(s) and Role:    * Cherre Robins, MD - Primary  ASSISTANT: none  ANESTHESIA:   local  EBL: min  BLOOD ADMINISTERED:none  DRAINS: none   LOCAL MEDICATIONS USED:  LIDOCAINE   SPECIMEN:  none  COUNTS: confirmed correct.  TOURNIQUET:  None  PATIENT DISPOSITION:  PACU - hemodynamically stable.   Delay start of Pharmacological VTE agent (>24hrs) due to surgical blood loss or risk of bleeding: no  INDICATION FOR PROCEDURE: Crystal Moore is a 75 y.o. female with atherosclerosis of native arteries of bilateral lower extremities with tissue loss.  Despite left upper and lower extremity contractures, her daughter affirms that she is ambulatory.  After careful discussion of risks, benefits, and alternatives the patient was offered angiography with intervention. The patient's daughter understood and wished to proceed.  OPERATIVE FINDINGS:  Unremarkable aortogram.  Diminutive iliac arteries bilaterally.  Common and profundofemoral arteries are patent without flow-limiting stenosis.  Long segment right SFA occlusion reconstituting flow in the behind knee popliteal artery.  Two-vessel runoff on the right via the peroneal and anterior tibial artery.  Long segment left SFA occlusion which appears flush at the origin.  Constitutional flow behind the knee on the left.  Three-vessel runoff to the foot on the left.  Anterior tibial artery appears dominant on the left.  Successful endovascular recanalization of the right superficial femoral artery occlusion.  Triphasic flow at the ankle at the AT on  completion.  Patient will need staged left lower extremity angiogram with intervention.  She is not a open vascular surgical candidate.  We will attempt to recanalize her left leg likely via tibial access to treat flush SFA occlusion next week.  Start dual antiplatelet therapy.  Start statin.  Okay for discharge with outpatient follow-up for angiogram from my perspective.  DESCRIPTION OF PROCEDURE: After identification of the patient in the pre-operative holding area, the patient was transferred to the operating room. The patient was positioned supine on the operating room table. Anesthesia was induced. The groins was prepped and draped in standard fashion. A surgical pause was performed confirming correct patient, procedure, and operative location.  The left groin was anesthetized with subcutaneous injection of 1% lidocaine. Using ultrasound guidance, the left common femoral artery was accessed with micropuncture technique. Fluoroscopy was used to confirm cannulation over the femoral head. Sheathogram was not performed. The 26F sheath was upsized to 20F.   An [place] was advanced into the distal aorta. Over the wire an omni flush catheter was advanced to the level of L2. Aortogram was performed - see above for details.  Bilateral lower extremity runoff was also performed.  See above for details.  The right common iliac artery was selected with the [place]. The wire was advanced into the superficial femoral artery.  Patient was heparinized with 5000 units of IV heparin.  A 6 French by 45 cm sheath was advanced into the proximal right superficial femoral artery.  Selective angiography was performed - see above for details.   I was able to navigate a 035 angled Glidewire and quick cross catheter into  a subintimal plane and crossed the lesion.  I reentered in the behind knee popliteal artery.  The catheter was advanced into the native artery.  Angiography was performed through the catheter to  confirm intraluminal position.  The wire was delivered into the tibial arteries.  The long segment occlusion was recanalized with three 6 x 120 mm Olevia stents deployed in standard fashion.  These were postdilated with a 5 x 150 mm Mustang balloon.  There was good angiographic result on the completion.  A triphasic anterior tibial artery Doppler signal was auscultated on the right.  A Perclose device was used to close the arteriotomy. Hemostasis was excellent upon completion.  Upon completion of the case instrument and sharps counts were confirmed correct. The patient was transferred to the PACU in good condition. I was present for all portions of the procedure.  [name], MD [facility] Phone Number: [phone] 08/30/2020 10:59 AM

## 2020-08-30 NOTE — TOC Initial Note (Signed)
Transition of Care [facility]) - Initial/Assessment Note    Patient Details  Name: Crystal Moore MRN: [id number] Date of Birth: Aug 06, 1945  [facility]) CM/SW Contact:    Trula Ore, [place] Phone Number: 08/30/2020, 2:45 PM  Clinical Narrative:                  CSW spoke with patients son at bedside. Patients son wanted CSW to call patients daughter Fraser Din to discuss DC plans. Fraser Din confirmed that she wants patient to go home with home health services. CSW let Fraser Din know she will have casemanager to call and follow up with her to make sure patient will have everything she needs to go home when medically ready for dc. CSW informed case Freight forwarder. Case manager confirmed she will follow up with Pat.  CSW will continue to follow.   Expected Discharge Plan: [place] Barriers to Discharge: Continued Medical Work up   Patient Goals and CMS Choice Patient states their goals for this hospitalization and ongoing recovery are:: to go home with home health services CMS Medicare.gov Compare Post Acute Care list provided to:: Patient Represenative (must comment) (Pat patients daughter) Choice offered to / list presented to : Adult Children Optometrist)  Expected Discharge Plan and Services Expected Discharge Plan: [place]       Living arrangements for the past 2 months: Single Family Home                                      Prior Living Arrangements/Services Living arrangements for the past 2 months: Single Family Home   Patient language and need for interpreter reviewed:: Yes Do you feel safe going back to the place where you live?: Yes      Need for Family Participation in Patient Care: Yes (Comment) Care giver support system in place?: Yes (comment)   Criminal Activity/Legal Involvement Pertinent to Current Situation/Hospitalization: No - Comment as needed  Activities of Daily Living Home Assistive Devices/Equipment: Walker (specify  type) ADL Screening (condition at time of admission) Patient's cognitive ability adequate to safely complete daily activities?: No Is the patient deaf or have difficulty hearing?: Yes Does the patient have difficulty seeing, even when wearing glasses/contacts?: No Does the patient have difficulty concentrating, remembering, or making decisions?: Yes Patient able to express need for assistance with ADLs?: Yes Does the patient have difficulty dressing or bathing?: Yes Independently performs ADLs?: No Communication: Independent Dressing (OT): Needs assistance Is this a change from baseline?: Pre-admission baseline Grooming: Needs assistance Is this a change from baseline?: Pre-admission baseline Feeding: Needs assistance Is this a change from baseline?: Pre-admission baseline Bathing: Needs assistance Is this a change from baseline?: Pre-admission baseline Toileting: Needs assistance Is this a change from baseline?: Pre-admission baseline In/Out Bed: Needs assistance Is this a change from baseline?: Pre-admission baseline Walks in Home: Needs assistance Is this a change from baseline?: Pre-admission baseline Does the patient have difficulty walking or climbing stairs?: Yes Weakness of Legs: Left Weakness of Arms/Hands: Left  Permission Sought/Granted Permission sought to share information with : Case Manager,Family Chief Financial Officer Permission granted to share information with : Yes, Verbal Permission Granted  Share Information with NAME: Fraser Din  Permission granted to share info w AGENCY: [place] granted to share info w Relationship: Daughter  Permission granted to share info w Contact Information: Fraser Din [phone]  Emotional Assessment Appearance:: Appears stated age Attitude/Demeanor/Rapport: Gracious Affect (typically observed): Calm Orientation: : Oriented to Place,Oriented to Self Alcohol / Substance Use: Not Applicable Psych Involvement: No  (comment)  Admission diagnosis:  Cellulitis [L03.90] Peripheral vascular disease ([place]) [I73.9] Cellulitis of right lower extremity [I95.188] Patient Active Problem List   Diagnosis Date Noted  . Cellulitis of great toe of right foot 08/26/2020  . CVA (cerebral vascular accident) ([place])   . COVID-19 virus infection 07/2020  . Ankle ulcer ([place]) 06/28/2020  . AKI (acute kidney injury) ([place]) 01/15/2019  . Cerebral thrombosis with cerebral infarction 01/15/2019  . TIA (transient ischemic attack) 01/13/2019  . Type 2 diabetes mellitus with vascular disease ([place]) 01/13/2019  . Hypokalemia 12/18/2018  . Acute CVA (cerebrovascular accident) ([place]) 12/17/2018  . Malnutrition of moderate degree 05/31/2018  . Hematochezia 05/29/2018  . Ischemic colitis ([place]) 05/29/2018  . Acute encephalopathy 05/29/2018  . Type 2 diabetes mellitus ([place]) 05/29/2018  . Hypertension 05/29/2018  . Depression 05/29/2018  . COPD (chronic obstructive pulmonary disease) ([place]) 05/29/2018  . Sepsis ([place]) 05/28/2018  . Diabetes ([place]) 03/16/2018  . ILD (interstitial lung disease) ([place]) 02/18/2018  . Chronic respiratory failure with hypoxia ([place]) 02/18/2018  . COPD (chronic obstructive pulmonary disease) ([place]) 01/12/2018  . Tobacco abuse 01/12/2018   PCP:  [name], MD Pharmacy:   [facility], [place] [address] Phone: [phone] Fax: [phone]     Social Determinants of Health (SDOH) Interventions    Readmission Risk Interventions No flowsheet data found.

## 2020-08-30 NOTE — Progress Notes (Signed)
Patient transferred to [facility] campus via [place] for procedure. Patient's daughter notified via telephone of patient's transfer. All belongings with patient.

## 2020-08-30 NOTE — Progress Notes (Signed)
PROGRESS NOTE    Crystal Moore  QQV:[id number] DOB: 1945-10-22 DOA: 08/26/2020 PCP: [name], MD    Brief Narrative:  Crystal Moore is a 75 year old female with past medical history significant for type 2 diabetes mellitus, peripheral vascular disease with vascular ulcer of left lateral ankle, history of CVA, essential hypertension, dementia, HLD, gout, OSA who presents to ED with right foot redness and darkening of the right great/second toe.  Patient's followed outpatient by wound center and recent ABIs showing severe peripheral vascular disease in both legs.  Patient pending referral to vascular surgery outpatient.  In the ED, temperature 100.2, BP 156/89, HR 114, RR 23, SPO2 90% on room air.  WBC 15.1, hemoglobin 9.7.  X-ray right foot with no acute finding.  Chest x-ray with no acute cardiopulmonary disease process.  Recent ABI roughly 1 week ago with significant peripheral vascular disease and arterial occlusive disease in bilateral lower extremities with ABIs of 0.50 and 0.57 on the right and left respectively.  Patient was admitted to the hospitalist service for further evaluation and treatment of progressive peripheral vascular disease and cellulitis.   Assessment & Plan:   Principal Problem:   Cellulitis of great toe of right foot Active Problems:   COPD (chronic obstructive pulmonary disease) (HCC)   ILD (interstitial lung disease) (HCC)   Sepsis (HCC)   Hypertension   Type 2 diabetes mellitus with vascular disease (HCC)   Ankle ulcer (HCC)   Sepsis, POA Right foot cellulitis Patient presenting with pain, erythema to right foot.  Was noted to be tachycardic with elevated temperature, elevated WBC count and source of right foot infection.  X-ray right foot with no acute process. --WBC 15.1>12.6>14.3>11.4 --Doxycycline 100mg  PO BID, Augmentin 875-125mg  PO BID  Severe bilateral peripheral vascular disease with ischemia Dry gangrene right great/second toe,  POA Left lateral ankle ulcer, POA Patient presenting with progressive discoloration to right great/second toe.  Recent ABI with severe peripheral vascular disease/peripheral artery disease.  Patient was seen by vascular surgery, Dr. Donnetta Hutching on 08/27/2020.  Dr. Really believes there is no role for aggressive revascularization attempts due to her nonambulatory status and recommends amputation if she was having pain or progressive tissue loss. --Aspirin 81 mg daily, Plavix 75 mg p.o. daily, atorvastatin 80 mg p.o. daily --Vascular surgery plans arteriogram on 08/30/2020, transferring to [facility]  Type 2 diabetes mellitus, uncontrolled with hyperglycemia Hemoglobin A1c 10.1, poorly controlled.  Home regimen includes Tresiba 25 unit subcutaneously daily, Tradjenta 5 mg p.o. daily. --Lantus 25 unit subcutaneously daily --Tradjenta 5 mg p.o. daily --Moderate insulin sliding scale for further coverage --CBGs QAC/HS  Essential hypertension --Amlodipine 5 mg p.o. daily  Hyperlipidemia: Atorvastatin 80 mg p.o. daily  Anxiety/depression: Lexapro 10 mg p.o. daily  Hx ILD/COPD --Albuterol MDI as needed  Dementia --Delirium precautions --Get up during the day --Encourage a familiar face to remain present throughout the day --Keep blinds open and lights on during daylight hours --Minimize the use of opioids/benzodiazepines    DVT prophylaxis: Lovenox   Code Status: Full Code Family Communication: Updated patient's daughter at bedside  Disposition Plan:  Level of care: Med-Surg Status is: Inpatient  Remains inpatient appropriate because:Ongoing diagnostic testing needed not appropriate for outpatient work up, Unsafe d/c plan, IV treatments appropriate due to intensity of illness or inability to take PO and Inpatient level of care appropriate due to severity of illness   Dispo: The patient is from: Home  Anticipated d/c is to: Home              Anticipated d/c date is: 2  days              Patient currently is not medically stable to d/c.   Difficult to place patient No   Consultants:   Vascular surgery, Dr. Donnetta Hutching  Procedures:   None  Antimicrobials:   Vancomycin 1/31 - 2/3  Cefepime 1/31 - 2/3  Flagyl 2/2 - 2/3  Doxycycline 2/3>>  Augmentin 2/3>>   Subjective: Patient seen and examined at bedside, resting comfortably; lying in bed.  Pleasantly confused. No family present. [facility] in room and transporting to [facility] this am for planned arteriogram today.  No concerns per nursing staff this morning.  Objective: Vitals:   08/29/20 1330 08/29/20 2146 08/30/20 0619 08/30/20 0700  BP: (!) 141/109 (!) 142/64 (!) 111/54   Pulse: 70 71 66   Resp: 16 16 16    Temp: 97.6 F (36.4 C) 98.2 F (36.8 C) 97.7 F (36.5 C)   TempSrc: Oral Oral Oral   SpO2: 96% 94% 100%   Weight:    61.5 kg  Height:        Intake/Output Summary (Last 24 hours) at 08/30/2020 0740 Last data filed at 08/30/2020 0701 Gross per 24 hour  Intake 120 ml  Output 650 ml  Net -530 ml   Filed Weights   08/26/20 2003 08/27/20 0035 08/30/20 0700  Weight: 61.2 kg 57.5 kg 61.5 kg    Examination:  General exam: Appears calm and comfortable, pleasantly confused Respiratory system: Clear to auscultation. Respiratory effort normal.  Oxygenating well on room air Cardiovascular system: S1 & S2 heard, RRR. No JVD, murmurs, rubs, gallops or clicks. No pedal edema. Gastrointestinal system: Abdomen is nondistended, soft and nontender. No organomegaly or masses felt. Normal bowel sounds heard. Central nervous system: Alert, not oriented to person/place/time/situation. No focal neurological deficits. Extremities: Moves all extremities independently Skin: Bullous lesion noted to right great/second toe with surrounding erythema, left lateral malleolus wound Psychiatry: Judgement and insight appear poor. Mood & affect appropriate.         Data Reviewed: I have personally  reviewed following labs and imaging studies  CBC: Recent Labs  Lab 08/26/20 2034 08/27/20 0309 08/28/20 0339 08/29/20 0322  WBC 15.1* 12.6* 14.3* 11.4*  NEUTROABS 12.2*  --   --   --   HGB 9.7* 9.1* 8.9* 8.8*  HCT 30.8* 29.6* 28.7* 28.3*  MCV 78.6* 79.8* 79.5* 78.6*  PLT 389 327 335 979   Basic Metabolic Panel: Recent Labs  Lab 08/26/20 2034 08/27/20 0309 08/28/20 0339 08/29/20 0322  NA 136 139 135 137  K 4.0 3.6 4.3 4.1  CL 100 106 102 103  CO2 24 25 24 24   GLUCOSE 334* 208* 184* 116*  BUN 18 15 12 16   CREATININE 0.87 0.69 0.74 0.80  CALCIUM 9.3 8.8* 8.7* 8.6*   GFR: Estimated Creatinine Clearance: 54.1 mL/min (by C-G formula based on SCr of 0.8 mg/dL). Liver Function Tests: Recent Labs  Lab 08/27/20 0309 08/28/20 0339 08/29/20 0322  AST 12* 14* 13*  ALT 13 15 14   ALKPHOS 95 84 76  BILITOT 0.4 0.4 0.5  PROT 7.1 6.8 6.8  ALBUMIN 2.9* 2.7* 2.7*   No results for input(s): LIPASE, AMYLASE in the last 168 hours. No results for input(s): AMMONIA in the last 168 hours. Coagulation Profile: No results for input(s): INR, PROTIME in the last 168 hours.  Cardiac Enzymes: No results for input(s): CKTOTAL, CKMB, CKMBINDEX, TROPONINI in the last 168 hours. BNP (last 3 results) No results for input(s): PROBNP in the last 8760 hours. HbA1C: Recent Labs    08/27/20 1716  HGBA1C 10.1*   CBG: Recent Labs  Lab 08/28/20 2152 08/29/20 0732 08/29/20 1209 08/29/20 1629 08/29/20 2149  GLUCAP 71 117* 173* 181* 112*   Lipid Profile: No results for input(s): CHOL, HDL, LDLCALC, TRIG, CHOLHDL, LDLDIRECT in the last 72 hours. Thyroid Function Tests: No results for input(s): TSH, T4TOTAL, FREET4, T3FREE, THYROIDAB in the last 72 hours. Anemia Panel: No results for input(s): VITAMINB12, FOLATE, FERRITIN, TIBC, IRON, RETICCTPCT in the last 72 hours. Sepsis Labs: Recent Labs  Lab 08/27/20 1716 08/27/20 1929  LATICACIDVEN 1.6 2.4*    Recent Results (from the past 240  hour(s))  SARS Coronavirus 2 by RT PCR (hospital order, performed in [facility] hospital lab) Nasopharyngeal Nasopharyngeal Swab     Status: None   Collection Time: 08/26/20  8:56 PM   Specimen: Nasopharyngeal Swab  Result Value Ref Range Status   SARS Coronavirus 2 NEGATIVE NEGATIVE Final    Comment: (NOTE) SARS-CoV-2 target nucleic acids are NOT DETECTED.  The SARS-CoV-2 RNA is generally detectable in upper and lower respiratory specimens during the acute phase of infection. The lowest concentration of SARS-CoV-2 viral copies this assay can detect is 250 copies / mL. A negative result does not preclude SARS-CoV-2 infection and should not be used as the sole basis for treatment or other patient management decisions.  A negative result may occur with improper specimen collection / handling, submission of specimen other than nasopharyngeal swab, presence of viral mutation(s) within the areas targeted by this assay, and inadequate number of viral copies (<250 copies / mL). A negative result must be combined with clinical observations, patient history, and epidemiological information.  Fact Sheet for Patients:   StrictlyIdeas.no  Fact Sheet for Healthcare Providers: BankingDealers.co.za  This test is not yet approved or  cleared by the [place] FDA and has been authorized for detection and/or diagnosis of SARS-CoV-2 by FDA under an Emergency Use Authorization (EUA).  This EUA will remain in effect (meaning this test can be used) for the duration of the COVID-19 declaration under Section 564(b)(1) of the Act, 21 U.S.C. section 360bbb-3(b)(1), unless the authorization is terminated or revoked sooner.  Performed at [facility], [place] [address] [place], [place] [address]   Culture, blood (routine x 2)     Status: None (Preliminary result)   Collection Time: 08/26/20  9:06 PM   Specimen: BLOOD  Result Value Ref  Range Status   Specimen Description   Final    BLOOD RIGHT WRIST Performed at [facility] [address] [place], [place] [address]    Special Requests   Final    BOTTLES DRAWN AEROBIC ONLY Blood Culture results may not be optimal due to an inadequate volume of blood received in culture bottles Performed at [facility] [address] [place], [place] [address]    Culture   Final    NO GROWTH 3 DAYS Performed at [facility], [place] [address] [place], [place] [address]    Report Status PENDING  Incomplete  Culture, blood (routine x 2)     Status: None (Preliminary result)   Collection Time: 08/26/20  9:14 PM   Specimen: BLOOD  Result Value Ref Range Status   Specimen Description   Final  BLOOD RIGHT ANTECUBITAL Performed at [facility] [address] [place], [place] [address]    Special Requests   Final    BOTTLES DRAWN AEROBIC AND ANAEROBIC Blood Culture results may not be optimal due to an excessive volume of blood received in culture bottles Performed at [facility] [address], [place], [place] [address]    Culture   Final    NO GROWTH 3 DAYS Performed at [facility], [place] [address], [place], [place] [address]    Report Status PENDING  Incomplete         Radiology Studies: No results found.      Scheduled Meds: . amLODipine  5 mg Oral Daily  . amoxicillin-clavulanate  1 tablet Oral Q12H  . aspirin EC  81 mg Oral Daily  . atorvastatin  80 mg Oral Daily  . clopidogrel  75 mg Oral Daily  . doxycycline  100 mg Oral Q12H  . enoxaparin (LOVENOX) injection  40 mg Subcutaneous Q24H  . escitalopram  10 mg Oral Daily  . feeding supplement  237 mL Oral Daily  . gabapentin  300 mg Oral TID  . insulin aspart  0-15 Units Subcutaneous TID WC  . insulin aspart  0-5 Units Subcutaneous QHS  . insulin glargine  25 Units Subcutaneous Daily  . linagliptin  5 mg Oral Daily    Continuous Infusions:    LOS: 4 days    Time spent: 38 minutes spent on chart review, discussion with nursing staff, consultants, updating family and interview/physical exam; more than 50% of that time was spent in counseling and/or coordination of care.    Cassidie Veiga J [place] DO [facility] Available via Epic secure chat 7am-7pm After these hours, please refer to coverage provider listed on [email or web address] 08/30/2020, 7:40 AM

## 2020-08-30 NOTE — Progress Notes (Signed)
   ASSESSMENT & PLAN:  Crystal Moore is a 75 y.o. female with bilateral lower extremity gangrene.  She is ambulatory per her daughter despite her contractures on the left. Plan aortogram, BLE runoff, possible RLE intervention via LCFA access today in cath lab.   SUBJECTIVE:  Minimally interactive.   OBJECTIVE:  BP (!) 167/53   Pulse 64   Temp 97.7 F (36.5 C) (Oral)   Resp 12   Ht 5\' 2"  (1.575 m)   Wt 61.5 kg   SpO2 96%   BMI 24.80 kg/m   Intake/Output Summary (Last 24 hours) at 08/30/2020 0901 Last data filed at 08/30/2020 0701 Gross per 24 hour  Intake 120 ml  Output 650 ml  Net -530 ml    2+ femoral pulses Unchanged tissue loss to bilateral lower extremities  CBC Latest Ref Rng & Units 08/29/2020 08/28/2020 08/27/2020  WBC 4.0 - 10.5 K/uL 11.4(H) 14.3(H) 12.6(H)  Hemoglobin 12.0 - 15.0 g/dL 8.8(L) 8.9(L) 9.1(L)  Hematocrit 36.0 - 46.0 % 28.3(L) 28.7(L) 29.6(L)  Platelets 150 - 400 K/uL 331 335 327     CMP Latest Ref Rng & Units 08/29/2020 08/28/2020 08/27/2020  Glucose 70 - 99 mg/dL 116(H) 184(H) 208(H)  BUN 8 - 23 mg/dL 16 12 15   Creatinine 0.44 - 1.00 mg/dL 0.80 0.74 0.69  Sodium 135 - 145 mmol/L 137 135 139  Potassium 3.5 - 5.1 mmol/L 4.1 4.3 3.6  Chloride 98 - 111 mmol/L 103 102 106  CO2 22 - 32 mmol/L 24 24 25   Calcium 8.9 - 10.3 mg/dL 8.6(L) 8.7(L) 8.8(L)  Total Protein 6.5 - 8.1 g/dL 6.8 6.8 7.1  Total Bilirubin 0.3 - 1.2 mg/dL 0.5 0.4 0.4  Alkaline Phos 38 - 126 U/L 76 84 95  AST 15 - 41 U/L 13(L) 14(L) 12(L)  ALT 0 - 44 U/L 14 15 13     Estimated Creatinine Clearance: 54.1 mL/min (by C-G formula based on SCr of 0.8 mg/dL).  Crystal Moore. Crystal Breed, MD [facility] Phone Number: [phone] 08/30/2020 9:01 AM

## 2020-08-30 NOTE — Progress Notes (Signed)
Bilateral drsgs noted to bilateral feet, no drainage present, right post tibial pulse dopplerable, left pedal pulse dopplerable, repositioned, safety maintained

## 2020-08-30 NOTE — Progress Notes (Signed)
Pt attempted to reach daughter Fraser Din without success. Unable to leave voicemail. CM did text her also to please call CM without a call. TOC following.

## 2020-08-31 DIAGNOSIS — L03031 Cellulitis of right toe: Secondary | ICD-10-CM | POA: Diagnosis not present

## 2020-08-31 LAB — CBC
HCT: 26.3 % — ABNORMAL LOW (ref 36.0–46.0)
Hemoglobin: 8.2 g/dL — ABNORMAL LOW (ref 12.0–15.0)
MCH: 24.2 pg — ABNORMAL LOW (ref 26.0–34.0)
MCHC: 31.2 g/dL (ref 30.0–36.0)
MCV: 77.6 fL — ABNORMAL LOW (ref 80.0–100.0)
Platelets: 346 10*3/uL (ref 150–400)
RBC: 3.39 MIL/uL — ABNORMAL LOW (ref 3.87–5.11)
RDW: 15.9 % — ABNORMAL HIGH (ref 11.5–15.5)
WBC: 9.6 10*3/uL (ref 4.0–10.5)
nRBC: 0 % (ref 0.0–0.2)

## 2020-08-31 LAB — GLUCOSE, CAPILLARY
Glucose-Capillary: 150 mg/dL — ABNORMAL HIGH (ref 70–99)
Glucose-Capillary: 159 mg/dL — ABNORMAL HIGH (ref 70–99)
Glucose-Capillary: 97 mg/dL (ref 70–99)
Glucose-Capillary: 78 mg/dL (ref 70–99)
Glucose-Capillary: 87 mg/dL (ref 70–99)

## 2020-08-31 LAB — CULTURE, BLOOD (ROUTINE X 2)
Culture: NO GROWTH
Culture: NO GROWTH

## 2020-08-31 LAB — BASIC METABOLIC PANEL
Anion gap: 9 (ref 5–15)
BUN: 11 mg/dL (ref 8–23)
CO2: 23 mmol/L (ref 22–32)
Calcium: 8.1 mg/dL — ABNORMAL LOW (ref 8.9–10.3)
Chloride: 104 mmol/L (ref 98–111)
Creatinine, Ser: 0.61 mg/dL (ref 0.44–1.00)
GFR, Estimated: >60 mL/min (ref >60)
Glucose, Bld: 145 mg/dL — ABNORMAL HIGH (ref 70–99)
Potassium: 4 mmol/L (ref 3.5–5.1)
Sodium: 136 mmol/L (ref 135–145)

## 2020-08-31 LAB — MAGNESIUM: Magnesium: 1.6 mg/dL — ABNORMAL LOW (ref 1.7–2.4)

## 2020-08-31 MED ORDER — MAGNESIUM SULFATE 2 GM/50ML IV SOLN
2.0000 g | Freq: Once | INTRAVENOUS | Status: AC
  Administered 2020-08-31: 2 g via INTRAVENOUS
  Filled 2020-08-31: qty 50

## 2020-08-31 MED ORDER — ACETAMINOPHEN 325 MG PO TABS
650.0000 mg | ORAL_TABLET | ORAL | Status: DC | PRN
  Administered 2020-09-02 (×2): 650 mg via ORAL
  Filled 2020-08-31 (×2): qty 2

## 2020-08-31 NOTE — Progress Notes (Signed)
[facility]  Subjective  -no complaints.   Objective (!) 147/63 72 98.7 F (37.1 C) (Axillary) 16 94%  Intake/Output Summary (Last 24 hours) at 08/31/2020 1018 Last data filed at 08/31/2020 1002 Gross per 24 hour  Intake 852 ml  Output 1400 ml  Net -548 ml    Left groin clean dry and intact Right AT brisk by Doppler  Laboratory Lab Results: Recent Labs    08/29/20 0322 08/31/20 0234  WBC 11.4* 9.6  HGB 8.8* 8.2*  HCT 28.3* 26.3*  PLT 331 346   BMET Recent Labs    08/29/20 0322 08/31/20 0234  NA 137 136  K 4.1 4.0  CL 103 104  CO2 24 23  GLUCOSE 116* 145*  BUN 16 11  CREATININE 0.80 0.61  CALCIUM 8.6* 8.1*    COAG Lab Results  Component Value Date   INR 1.0 08/09/2020   INR 1.1 01/13/2019   INR 1.22 05/28/2018   No results found for: PTT  Assessment/Planning:  75 year old female postop day 1 status post right SFA popliteal stenting by Dr. Stanford Breed. She has a brisk right AT signal today. She is scheduled for left leg intervention on Wednesday with Dr. Stanford Breed this week. Will need aspirin Plavix.  Marty Heck 08/31/2020 10:18 AM --

## 2020-08-31 NOTE — Progress Notes (Signed)
PROGRESS NOTE    Crystal Moore  LTJ:[id number] DOB: 1946/04/23 DOA: 08/26/2020 PCP: [name], MD   Brief Narrative:  Crystal Moore is a 75 year old female with past medical history significant for type 2 diabetes mellitus, peripheral vascular disease with vascular ulcer of left lateral ankle, history of CVA, essential hypertension, dementia, HLD, gout, OSA who presents to ED with right foot redness and darkening of the right great/second toe.  Patient's followed outpatient by wound center and recent ABIs showing severe peripheral vascular disease in both legs.   In the ED, temperature 100.2, BP 156/89, HR 114, RR 23, SPO2 90% on room air.  WBC 15.1, hemoglobin 9.7.  X-ray right foot with no acute finding.  Chest x-ray with no acute cardiopulmonary disease process.  Recent ABI roughly 1 week ago with significant peripheral vascular disease and arterial occlusive disease in bilateral lower extremities with ABIs of 0.50 and 0.57 on the right and left respectively.  Patient was admitted to the hospitalist service for further evaluation and treatment of progressive peripheral vascular disease and cellulitis  Assessment & Plan:  Sepsis in the setting of right foot cellulitis: -Patient met sepsis criteria on admission with leukocytosis, tachycardia, tachypnea and fever. -X-ray of right foot shows no acute process.  She presented with worsening pain, redness in right foot. -Remained afebrile and leukocytosis is resolved. -Continue p.o. twice daily and Augmentin p.o. twice daily  Severe bilateral peripheral vascular disease with ischemia Dry gangrene in right great/second toe Left lateral ankle ulcer: -S/p right SFA popliteal stenting by Dr. Criss Alvine: 1 -Plan is for left leg intervention on Wednesday with Dr. Luan Pulling -Continue aspirin, statin and Plavix -Appreciate vascular surgery's recommendations  Type 2 diabetes mellitus uncontrolled with hyperglycemia: -A1c:  10.1% -Continue Lantus 25 units daily, Tradjenta 5 mg daily and sliding scale insulin  Hypertension: Stable, continue amlodipine  Hyperlipidemia: Continue statin  Anxiety/depression: Continue Lexapro  History of ILD/COPD: Continue albuterol as needed.  Patient is maintaining oxygen saturation on room air.  No wheezing noted on exam  Dementia: -Continue with delirium precautions. -Minimize use of opioids/benzos.  Hypomagnesemia: Replenished.  Repeat magnesium level tomorrow AM.  DVT prophylaxis: Lovenox Code Status: Full code Family Communication:  None present at bedside.  Plan of care discussed with patient in length and she verbalized understanding and agreed with it. Disposition Plan: Determined  Consultants:  Vascular surgery Procedures:   Right SFA popliteal stenting  Antimicrobials:   Vancomycin 1/31 - 2/3  Cefepime 1/31 - 2/3  Flagyl 2/2 - 2/3  Doxycycline 2/3>>  Augmentin 2/3>>    Status is: Inpatient   Dispo: The patient is from: Home              Anticipated d/c is to: SNF              Anticipated d/c date is: > 3 days              Patient currently is not medically stable to d/c.   Difficult to place patient No    Subjective: Patient seen and examined.  Sitting comfortably on the bed and eating breakfast.  Hard of hearing.  Denies any new complaints.  Remained afebrile.  No acute events overnight.  Objective: Vitals:   08/30/20 2117 08/30/20 2316 08/31/20 0300 08/31/20 0446  BP: 137/61 (!) 152/55  (!) 147/63  Pulse: 79 74  72  Resp: 18 19  16   Temp: 98.9 F (37.2 C) 98 F (36.7 C)  98.7 F (37.1  C)  TempSrc: Oral Oral  Axillary  SpO2: 100% 98%  94%  Weight:   60.8 kg   Height:        Intake/Output Summary (Last 24 hours) at 08/31/2020 1254 Last data filed at 08/31/2020 1002 Gross per 24 hour  Intake 1092 ml  Output 1400 ml  Net -308 ml   Filed Weights   08/27/20 0035 08/30/20 0700 08/31/20 0300  Weight: 57.5 kg 61.5 kg 60.8 kg     Examination:  General exam: Appears calm and comfortable, on room air, hard of hearing, appears pleasant and eating breakfast Respiratory system: Clear to auscultation. Respiratory effort normal. Cardiovascular system: S1 & S2 heard, RRR. No JVD, murmurs, rubs, gallops or clicks. No pedal edema. Gastrointestinal system: Abdomen is nondistended, soft and nontender. No organomegaly or masses felt. Normal bowel sounds heard. Central nervous system: Alert and oriented. No focal neurological deficits. Extremities: Dressing dry and intact left groin and feet. psychiatry: Judgement and insight appear normal. Mood & affect appropriate.    Data Reviewed: I have personally reviewed following labs and imaging studies  CBC: Recent Labs  Lab 08/26/20 2034 08/27/20 0309 08/28/20 0339 08/29/20 0322 08/31/20 0234  WBC 15.1* 12.6* 14.3* 11.4* 9.6  NEUTROABS 12.2*  --   --   --   --   HGB 9.7* 9.1* 8.9* 8.8* 8.2*  HCT 30.8* 29.6* 28.7* 28.3* 26.3*  MCV 78.6* 79.8* 79.5* 78.6* 77.6*  PLT 389 327 335 331 217   Basic Metabolic Panel: Recent Labs  Lab 08/26/20 2034 08/27/20 0309 08/28/20 0339 08/29/20 0322 08/31/20 0234  NA 136 139 135 137 136  K 4.0 3.6 4.3 4.1 4.0  CL 100 106 102 103 104  CO2 24 25 24 24 23   GLUCOSE 334* 208* 184* 116* 145*  BUN 18 15 12 16 11   CREATININE 0.87 0.69 0.74 0.80 0.61  CALCIUM 9.3 8.8* 8.7* 8.6* 8.1*  MG  --   --   --   --  1.6*   GFR: Estimated Creatinine Clearance: 53.8 mL/min (by C-G formula based on SCr of 0.61 mg/dL). Liver Function Tests: Recent Labs  Lab 08/27/20 0309 08/28/20 0339 08/29/20 0322  AST 12* 14* 13*  ALT 13 15 14   ALKPHOS 95 84 76  BILITOT 0.4 0.4 0.5  PROT 7.1 6.8 6.8  ALBUMIN 2.9* 2.7* 2.7*   No results for input(s): LIPASE, AMYLASE in the last 168 hours. No results for input(s): AMMONIA in the last 168 hours. Coagulation Profile: No results for input(s): INR, PROTIME in the last 168 hours. Cardiac Enzymes: No  results for input(s): CKTOTAL, CKMB, CKMBINDEX, TROPONINI in the last 168 hours. BNP (last 3 results) No results for input(s): PROBNP in the last 8760 hours. HbA1C: No results for input(s): HGBA1C in the last 72 hours. CBG: Recent Labs  Lab 08/30/20 1501 08/30/20 1711 08/30/20 2119 08/31/20 0816 08/31/20 1139  GLUCAP 118* 239* 206* 150* 159*   Lipid Profile: No results for input(s): CHOL, HDL, LDLCALC, TRIG, CHOLHDL, LDLDIRECT in the last 72 hours. Thyroid Function Tests: No results for input(s): TSH, T4TOTAL, FREET4, T3FREE, THYROIDAB in the last 72 hours. Anemia Panel: No results for input(s): VITAMINB12, FOLATE, FERRITIN, TIBC, IRON, RETICCTPCT in the last 72 hours. Sepsis Labs: Recent Labs  Lab 08/27/20 1716 08/27/20 1929  LATICACIDVEN 1.6 2.4*    Recent Results (from the past 240 hour(s))  SARS Coronavirus 2 by RT PCR (hospital order, performed in [facility] hospital lab) Nasopharyngeal Nasopharyngeal Swab  Status: None   Collection Time: 08/26/20  8:56 PM   Specimen: Nasopharyngeal Swab  Result Value Ref Range Status   SARS Coronavirus 2 NEGATIVE NEGATIVE Final    Comment: (NOTE) SARS-CoV-2 target nucleic acids are NOT DETECTED.  The SARS-CoV-2 RNA is generally detectable in upper and lower respiratory specimens during the acute phase of infection. The lowest concentration of SARS-CoV-2 viral copies this assay can detect is 250 copies / mL. A negative result does not preclude SARS-CoV-2 infection and should not be used as the sole basis for treatment or other patient management decisions.  A negative result may occur with improper specimen collection / handling, submission of specimen other than nasopharyngeal swab, presence of viral mutation(s) within the areas targeted by this assay, and inadequate number of viral copies (<250 copies / mL). A negative result must be combined with clinical observations, patient history, and epidemiological  information.  Fact Sheet for Patients:   StrictlyIdeas.no  Fact Sheet for Healthcare Providers: BankingDealers.co.za  This test is not yet approved or  cleared by the [place] FDA and has been authorized for detection and/or diagnosis of SARS-CoV-2 by FDA under an Emergency Use Authorization (EUA).  This EUA will remain in effect (meaning this test can be used) for the duration of the COVID-19 declaration under Section 564(b)(1) of the Act, 21 U.S.C. section 360bbb-3(b)(1), unless the authorization is terminated or revoked sooner.  Performed at [facility], [place] [address], [place], [place] [address]   Culture, blood (routine x 2)     Status: None   Collection Time: 08/26/20  9:06 PM   Specimen: BLOOD  Result Value Ref Range Status   Specimen Description   Final    BLOOD RIGHT WRIST Performed at [facility] [address] [place], [place] [address]    Special Requests   Final    BOTTLES DRAWN AEROBIC ONLY Blood Culture results may not be optimal due to an inadequate volume of blood received in culture bottles Performed at [facility] [address], [place], [place] [address]    Culture   Final    NO GROWTH 5 DAYS Performed at [facility], [place] [address] [place], [place] [address]    Report Status 08/31/2020 FINAL  Final  Culture, blood (routine x 2)     Status: None   Collection Time: 08/26/20  9:14 PM   Specimen: BLOOD  Result Value Ref Range Status   Specimen Description   Final    BLOOD RIGHT ANTECUBITAL Performed at [facility] [address], [place], [place] [address]    Special Requests   Final    BOTTLES DRAWN AEROBIC AND ANAEROBIC Blood Culture results may not be optimal due to an excessive volume of blood received in culture bottles Performed at [facility] [address] [place], [place]  [address]    Culture   Final    NO GROWTH 5 DAYS Performed at [facility], [place] [address], [place], [place] [address]    Report Status 08/31/2020 FINAL  Final      Radiology Studies: PERIPHERAL VASCULAR CATHETERIZATION  Result Date: 08/30/2020 DATE OF SERVICE: 08/30/2020  PATIENT:  Santa Lighter  75 y.o. female  PRE-OPERATIVE DIAGNOSIS:  Atherosclerosis of native arteries of bilateral lower extremities with tissue loss  POST-OPERATIVE DIAGNOSIS:  Same  PROCEDURE:  1) US guided LCFA access 2) Aortogram 3) RLE angiogram with third order cannulation (149m  contrast) 4) R SFA / popliteal stenting (6x17m Eluvia x 3)   SURGEON:  Surgeon(s) and Role:    * Hawken, TYevonne Aline MD - Primary  ASSISTANT: none  ANESTHESIA:   local  EBL: min  BLOOD ADMINISTERED:none  DRAINS: none  LOCAL MEDICATIONS USED:  LIDOCAINE  SPECIMEN:  none  COUNTS: confirmed correct.  TOURNIQUET:  None  PATIENT DISPOSITION:  PACU - hemodynamically stable.  Delay start of Pharmacological VTE agent (>24hrs) due to surgical blood loss or risk of bleeding: no  INDICATION FOR PROCEDURE: LRUBYANN LINGLEis a 75y.o. female with atherosclerosis of native arteries of bilateral lower extremities with tissue loss.  Despite left upper and lower extremity contractures, her daughter affirms that she is ambulatory.  After careful discussion of risks, benefits, and alternatives the patient was offered angiography with intervention. The patient's daughter understood and wished to proceed.  OPERATIVE FINDINGS: Unremarkable aortogram.  Diminutive iliac arteries bilaterally.  Common and profundofemoral arteries are patent without flow-limiting stenosis.  Long segment right SFA occlusion reconstituting flow in the behind knee popliteal artery.  Two-vessel runoff on the right via the peroneal and anterior tibial artery.  Long segment left SFA occlusion which appears flush at the origin.  Constitutional flow behind the knee on the left.   Three-vessel runoff to the foot on the left.  Anterior tibial artery appears dominant on the left.  Successful endovascular recanalization of the right superficial femoral artery occlusion.  Triphasic flow at the ankle at the AT on completion.  Patient will need staged left lower extremity angiogram with intervention.  She is not a open vascular surgical candidate.  We will attempt to recanalize her left leg likely via tibial access to treat flush SFA occlusion next week.  Start dual antiplatelet therapy.  Start statin.  Okay for discharge with outpatient follow-up for angiogram from my perspective.  DESCRIPTION OF PROCEDURE: After identification of the patient in the pre-operative holding area, the patient was transferred to the operating room. The patient was positioned supine on the operating room table. Anesthesia was induced. The groins was prepped and draped in standard fashion. A surgical pause was performed confirming correct patient, procedure, and operative location.  The left groin was anesthetized with subcutaneous injection of 1% lidocaine. Using ultrasound guidance, the left common femoral artery was accessed with micropuncture technique. Fluoroscopy was used to confirm cannulation over the femoral head. Sheathogram was not performed. The 29F sheath was upsized to 84F.  An 0[place] advanced into the distal aorta. Over the wire an omni flush catheter was advanced to the level of L2. Aortogram was performed - see above for details.  Bilateral lower extremity runoff was also performed.  See above for details.  The right common iliac artery was selected with the 0[place] The wire was advanced into the superficial femoral artery.  Patient was heparinized with 5000 units of IV heparin.  A 6 French by 45 cm sheath was advanced into the proximal right superficial femoral artery.  Selective angiography was performed - see above for details.  I was able to navigate a 035 angled Glidewire and  quick cross catheter into a subintimal plane and crossed the lesion.  I reentered in the behind knee popliteal artery.  The catheter was advanced into the native artery.  Angiography was performed through the catheter to confirm intraluminal position.  The wire was delivered into the tibial arteries.  The long segment occlusion was recanalized with three 6 x 120 mm  Olevia stents deployed in standard fashion.  These were postdilated with a 5 x 150 mm Mustang balloon.  There was good angiographic result on the completion.  A triphasic anterior tibial artery Doppler signal was auscultated on the right.  A Perclose device was used to close the arteriotomy. Hemostasis was excellent upon completion.  Upon completion of the case instrument and sharps counts were confirmed correct. The patient was transferred to the PACU in good condition. I was present for all portions of the procedure.  [name], MD [facility] Phone Number: [phone] 08/30/2020 10:59 AM   Scheduled Meds: . amLODipine  5 mg Oral Daily  . amoxicillin-clavulanate  1 tablet Oral Q12H  . aspirin EC  81 mg Oral Daily  . atorvastatin  80 mg Oral Daily  . clopidogrel  75 mg Oral Q breakfast  . doxycycline  100 mg Oral Q12H  . enoxaparin (LOVENOX) injection  40 mg Subcutaneous Q24H  . escitalopram  10 mg Oral Daily  . feeding supplement  237 mL Oral Daily  . gabapentin  300 mg Oral TID  . insulin aspart  0-15 Units Subcutaneous TID WC  . insulin aspart  0-5 Units Subcutaneous QHS  . insulin glargine  25 Units Subcutaneous Daily  . linagliptin  5 mg Oral Daily  . sodium chloride flush  3 mL Intravenous Q12H   Continuous Infusions: . sodium chloride       LOS: 5 days   Time spent: 35 minutes   Ashby Leflore Loann Quill, MD [facility]  If 7PM-7AM, please contact night-coverage [email or web address] 08/31/2020, 12:54 PM

## 2020-09-01 DIAGNOSIS — L03031 Cellulitis of right toe: Secondary | ICD-10-CM | POA: Diagnosis not present

## 2020-09-01 LAB — CBC
HCT: 24.8 % — ABNORMAL LOW (ref 36.0–46.0)
Hemoglobin: 8.1 g/dL — ABNORMAL LOW (ref 12.0–15.0)
MCH: 25 pg — ABNORMAL LOW (ref 26.0–34.0)
MCHC: 32.7 g/dL (ref 30.0–36.0)
MCV: 76.5 fL — ABNORMAL LOW (ref 80.0–100.0)
Platelets: 351 10*3/uL (ref 150–400)
RBC: 3.24 MIL/uL — ABNORMAL LOW (ref 3.87–5.11)
RDW: 16 % — ABNORMAL HIGH (ref 11.5–15.5)
WBC: 10.8 10*3/uL — ABNORMAL HIGH (ref 4.0–10.5)
nRBC: 0 % (ref 0.0–0.2)

## 2020-09-01 LAB — GLUCOSE, CAPILLARY
Glucose-Capillary: 93 mg/dL (ref 70–99)
Glucose-Capillary: 176 mg/dL — ABNORMAL HIGH (ref 70–99)
Glucose-Capillary: 232 mg/dL — ABNORMAL HIGH (ref 70–99)
Glucose-Capillary: 217 mg/dL — ABNORMAL HIGH (ref 70–99)

## 2020-09-01 LAB — MAGNESIUM: Magnesium: 1.8 mg/dL (ref 1.7–2.4)

## 2020-09-01 NOTE — Progress Notes (Signed)
PROGRESS NOTE    CYAN CLIPPINGER  BWI:[id number] DOB: 1945-09-04 DOA: 08/26/2020 PCP: [name], MD   Brief Narrative:  Crystal Moore is a 75 year old female with past medical history significant for type 2 diabetes mellitus, peripheral vascular disease with vascular ulcer of left lateral ankle, history of CVA, essential hypertension, dementia, HLD, gout, OSA who presents to ED with right foot redness and darkening of the right great/second toe.  Patient's followed outpatient by wound center and recent ABIs showing severe peripheral vascular disease in both legs.   In the ED, temperature 100.2, BP 156/89, HR 114, RR 23, SPO2 90% on room air.  WBC 15.1, hemoglobin 9.7.  X-ray right foot with no acute finding.  Chest x-ray with no acute cardiopulmonary disease process.  Recent ABI roughly 1 week ago with significant peripheral vascular disease and arterial occlusive disease in bilateral lower extremities with ABIs of 0.50 and 0.57 on the right and left respectively.  Patient was admitted to the hospitalist service for further evaluation and treatment of progressive peripheral vascular disease and cellulitis  Assessment & Plan:  Sepsis in the setting of right foot cellulitis: -Patient met sepsis criteria on admission with leukocytosis, tachycardia, tachypnea and fever. -X-ray of right foot shows no acute process.  She presented with worsening pain, redness in right foot. -Remained afebrile and leukocytosis is improving .-Continue doxycycline 100 mg p.o. twice daily and Augmentin 875-125 p.o. twice daily  Severe bilateral peripheral vascular disease with ischemia Dry gangrene in right great/second toe Left lateral ankle ulcer: -S/p right SFA popliteal stenting by Dr. Criss Alvine: 2 -Plan is for left leg intervention on Wednesday with Dr. Luan Pulling -Continue aspirin, statin and Plavix -Appreciate vascular surgery's recommendations  Type 2 diabetes mellitus uncontrolled with  hyperglycemia: -A1c: 10.1% -Continue Lantus 25 units daily, Tradjenta 5 mg daily and sliding scale insulin  Hypertension: Stable, continue amlodipine  Hyperlipidemia: Continue statin  Anxiety/depression: Continue Lexapro  History of ILD/COPD: Continue albuterol as needed.  Patient is maintaining oxygen saturation on room air.  No wheezing noted on exam  Dementia: -Continue with delirium precautions. -Minimize use of opioids/benzos.  Hypomagnesemia: Resolved  DVT prophylaxis: Lovenox Code Status: Full code Family Communication:  None present at bedside.  Plan of care discussed with patient in length and she verbalized understanding and agreed with it. Disposition Plan: Determined  Consultants:  Vascular surgery Procedures:   Right SFA popliteal stenting  Antimicrobials:   Vancomycin 1/31 - 2/3  Cefepime 1/31 - 2/3  Flagyl 2/2 - 2/3  Doxycycline 2/3>>  Augmentin 2/3>>    Status is: Inpatient   Dispo: The patient is from: Home              Anticipated d/c is to: SNF              Anticipated d/c date is: > 3 days              Patient currently is not medically stable to d/c.   Difficult to place patient No    Subjective: Patient seen and examined.  Resting comfortably on the bed, sleepy but arousable and denies any new complaints.  Remained afebrile and no acute events overnight.  Objective: Vitals:   08/31/20 2217 09/01/20 0018 09/01/20 0418 09/01/20 0420  BP: (!) 141/60 (!) 149/63 (!) 121/48   Pulse: 73 74 70   Resp: 19 20    Temp: 99.2 F (37.3 C) 98 F (36.7 C)  98 F (36.7 C)  TempSrc: Axillary Axillary  Axillary  SpO2:  95% 93%   Weight:      Height:        Intake/Output Summary (Last 24 hours) at 09/01/2020 1035 Last data filed at 09/01/2020 0092 Gross per 24 hour  Intake 600 ml  Output --  Net 600 ml   Filed Weights   08/27/20 0035 08/30/20 0700 08/31/20 0300  Weight: 57.5 kg 61.5 kg 60.8 kg    Examination:  General exam: Appears  calm and comfortable, elderly African-American female, on nasal cannula, hard of hearing, sleepy but arousable  respiratory system: Clear to auscultation. Respiratory effort normal. Cardiovascular system: S1 & S2 heard, RRR. No JVD, murmurs, rubs, gallops or clicks. No pedal edema. Gastrointestinal system: Abdomen is nondistended, soft and nontender. No organomegaly or masses felt. Normal bowel sounds heard. Central nervous system: Sleepy but arousable and following commands.   Extremities: Dressing dry and intact left groin and feet. psychiatry: Judgement and insight appear normal. Mood & affect appropriate.    Data Reviewed: I have personally reviewed following labs and imaging studies  CBC: Recent Labs  Lab 08/26/20 2034 08/27/20 0309 08/28/20 0339 08/29/20 0322 08/31/20 0234 09/01/20 0255  WBC 15.1* 12.6* 14.3* 11.4* 9.6 10.8*  NEUTROABS 12.2*  --   --   --   --   --   HGB 9.7* 9.1* 8.9* 8.8* 8.2* 8.1*  HCT 30.8* 29.6* 28.7* 28.3* 26.3* 24.8*  MCV 78.6* 79.8* 79.5* 78.6* 77.6* 76.5*  PLT 389 327 335 331 346 330   Basic Metabolic Panel: Recent Labs  Lab 08/26/20 2034 08/27/20 0309 08/28/20 0339 08/29/20 0322 08/31/20 0234 09/01/20 0255  NA 136 139 135 137 136  --   K 4.0 3.6 4.3 4.1 4.0  --   CL 100 106 102 103 104  --   CO2 24 25 24 24 23   --   GLUCOSE 334* 208* 184* 116* 145*  --   BUN 18 15 12 16 11   --   CREATININE 0.87 0.69 0.74 0.80 0.61  --   CALCIUM 9.3 8.8* 8.7* 8.6* 8.1*  --   MG  --   --   --   --  1.6* 1.8   GFR: Estimated Creatinine Clearance: 53.8 mL/min (by C-G formula based on SCr of 0.61 mg/dL). Liver Function Tests: Recent Labs  Lab 08/27/20 0309 08/28/20 0339 08/29/20 0322  AST 12* 14* 13*  ALT 13 15 14   ALKPHOS 95 84 76  BILITOT 0.4 0.4 0.5  PROT 7.1 6.8 6.8  ALBUMIN 2.9* 2.7* 2.7*   No results for input(s): LIPASE, AMYLASE in the last 168 hours. No results for input(s): AMMONIA in the last 168 hours. Coagulation Profile: No  results for input(s): INR, PROTIME in the last 168 hours. Cardiac Enzymes: No results for input(s): CKTOTAL, CKMB, CKMBINDEX, TROPONINI in the last 168 hours. BNP (last 3 results) No results for input(s): PROBNP in the last 8760 hours. HbA1C: No results for input(s): HGBA1C in the last 72 hours. CBG: Recent Labs  Lab 08/31/20 1139 08/31/20 1655 08/31/20 2221 08/31/20 2227 09/01/20 0726  GLUCAP 159* 97 87 78 93   Lipid Profile: No results for input(s): CHOL, HDL, LDLCALC, TRIG, CHOLHDL, LDLDIRECT in the last 72 hours. Thyroid Function Tests: No results for input(s): TSH, T4TOTAL, FREET4, T3FREE, THYROIDAB in the last 72 hours. Anemia Panel: No results for input(s): VITAMINB12, FOLATE, FERRITIN, TIBC, IRON, RETICCTPCT in the last 72 hours. Sepsis Labs: Recent Labs  Lab 08/27/20 1716 08/27/20 1929  LATICACIDVEN 1.6  2.4*    Recent Results (from the past 240 hour(s))  SARS Coronavirus 2 by RT PCR (hospital order, performed in [facility] hospital lab) Nasopharyngeal Nasopharyngeal Swab     Status: None   Collection Time: 08/26/20  8:56 PM   Specimen: Nasopharyngeal Swab  Result Value Ref Range Status   SARS Coronavirus 2 NEGATIVE NEGATIVE Final    Comment: (NOTE) SARS-CoV-2 target nucleic acids are NOT DETECTED.  The SARS-CoV-2 RNA is generally detectable in upper and lower respiratory specimens during the acute phase of infection. The lowest concentration of SARS-CoV-2 viral copies this assay can detect is 250 copies / mL. A negative result does not preclude SARS-CoV-2 infection and should not be used as the sole basis for treatment or other patient management decisions.  A negative result may occur with improper specimen collection / handling, submission of specimen other than nasopharyngeal swab, presence of viral mutation(s) within the areas targeted by this assay, and inadequate number of viral copies (<250 copies / mL). A negative result must be combined with  clinical observations, patient history, and epidemiological information.  Fact Sheet for Patients:   StrictlyIdeas.no  Fact Sheet for Healthcare Providers: BankingDealers.co.za  This test is not yet approved or  cleared by the [place] FDA and has been authorized for detection and/or diagnosis of SARS-CoV-2 by FDA under an Emergency Use Authorization (EUA).  This EUA will remain in effect (meaning this test can be used) for the duration of the COVID-19 declaration under Section 564(b)(1) of the Act, 21 U.S.C. section 360bbb-3(b)(1), unless the authorization is terminated or revoked sooner.  Performed at [facility], [place] [address] [place], [place] [address]   Culture, blood (routine x 2)     Status: None   Collection Time: 08/26/20  9:06 PM   Specimen: BLOOD  Result Value Ref Range Status   Specimen Description   Final    BLOOD RIGHT WRIST Performed at [place] [address], [place], [place] [address]    Special Requests   Final    BOTTLES DRAWN AEROBIC ONLY Blood Culture results may not be optimal due to an inadequate volume of blood received in culture bottles Performed at [place] [address], [place], [place] [address]    Culture   Final    NO GROWTH 5 DAYS Performed at [facility], [place] [address], [place], [place] [address]    Report Status 08/31/2020 FINAL  Final  Culture, blood (routine x 2)     Status: None   Collection Time: 08/26/20  9:14 PM   Specimen: BLOOD  Result Value Ref Range Status   Specimen Description   Final    BLOOD RIGHT ANTECUBITAL Performed at [place] [address] [place], [place] [address]    Special Requests   Final    BOTTLES DRAWN AEROBIC AND ANAEROBIC Blood Culture results may not be optimal due to an excessive volume of blood received in culture bottles Performed at [place] [address], [place], [place] [address]    Culture   Final    NO GROWTH 5 DAYS Performed at [facility], [place] [address] [place], [place] [address]    Report Status 08/31/2020 FINAL  Final      Radiology Studies: No results found.  Scheduled Meds: . amLODipine  5 mg Oral Daily  . amoxicillin-clavulanate  1 tablet Oral Q12H  . aspirin EC  81 mg Oral Daily  . atorvastatin  80 mg Oral Daily  . clopidogrel  75 mg Oral Q breakfast  . doxycycline  100 mg Oral Q12H  . enoxaparin (LOVENOX) injection  40 mg Subcutaneous Q24H  . escitalopram  10 mg Oral Daily  . feeding supplement  237 mL Oral Daily  . gabapentin  300 mg Oral TID  . insulin aspart  0-15 Units Subcutaneous TID WC  . insulin aspart  0-5 Units Subcutaneous QHS  . insulin glargine  25 Units Subcutaneous Daily  . linagliptin  5 mg Oral Daily  . sodium chloride flush  3 mL Intravenous Q12H   Continuous Infusions: . sodium chloride       LOS: 6 days   Time spent: 35 minutes   Brallan Denio Loann Quill, MD [facility]  If 7PM-7AM, please contact night-coverage [email or web address] 09/01/2020, 10:35 AM

## 2020-09-02 ENCOUNTER — Encounter (HOSPITAL_BASED_OUTPATIENT_CLINIC_OR_DEPARTMENT_OTHER): Payer: Medicare Other | Admitting: Internal Medicine

## 2020-09-02 DIAGNOSIS — L03031 Cellulitis of right toe: Secondary | ICD-10-CM | POA: Diagnosis not present

## 2020-09-02 LAB — CREATININE, SERUM
Creatinine, Ser: 0.76 mg/dL (ref 0.44–1.00)
GFR, Estimated: >60 mL/min (ref >60)

## 2020-09-02 LAB — CBC
HCT: 25.6 % — ABNORMAL LOW (ref 36.0–46.0)
Hemoglobin: 8.3 g/dL — ABNORMAL LOW (ref 12.0–15.0)
MCH: 24.9 pg — ABNORMAL LOW (ref 26.0–34.0)
MCHC: 32.4 g/dL (ref 30.0–36.0)
MCV: 76.6 fL — ABNORMAL LOW (ref 80.0–100.0)
Platelets: 374 10*3/uL (ref 150–400)
RBC: 3.34 MIL/uL — ABNORMAL LOW (ref 3.87–5.11)
RDW: 16.1 % — ABNORMAL HIGH (ref 11.5–15.5)
WBC: 11.3 10*3/uL — ABNORMAL HIGH (ref 4.0–10.5)
nRBC: 0 % (ref 0.0–0.2)

## 2020-09-02 LAB — IRON AND TIBC
Iron: 22 ug/dL — ABNORMAL LOW (ref 28–170)
Saturation Ratios: 9 % — ABNORMAL LOW (ref 10.4–31.8)
TIBC: 252 ug/dL (ref 250–450)
UIBC: 230 ug/dL

## 2020-09-02 LAB — LACTIC ACID, PLASMA
Lactic Acid, Venous: 2.1 mmol/L (ref 0.5–1.9)
Lactic Acid, Venous: 1.8 mmol/L (ref 0.5–1.9)

## 2020-09-02 LAB — GLUCOSE, CAPILLARY
Glucose-Capillary: 150 mg/dL — ABNORMAL HIGH (ref 70–99)
Glucose-Capillary: 250 mg/dL — ABNORMAL HIGH (ref 70–99)
Glucose-Capillary: 208 mg/dL — ABNORMAL HIGH (ref 70–99)
Glucose-Capillary: 194 mg/dL — ABNORMAL HIGH (ref 70–99)

## 2020-09-02 LAB — FERRITIN: Ferritin: 18 ng/mL (ref 11–307)

## 2020-09-02 MED ORDER — SALINE SPRAY 0.65 % NA SOLN
1.0000 | NASAL | Status: DC | PRN
  Administered 2020-09-02: 1 via NASAL
  Filled 2020-09-02: qty 44

## 2020-09-02 MED ORDER — SODIUM CHLORIDE 0.9 % IV SOLN: INTRAVENOUS | Status: DC

## 2020-09-02 MED ORDER — FERROUS SULFATE 325 (65 FE) MG PO TABS
325.0000 mg | ORAL_TABLET | Freq: Two times a day (BID) | ORAL | Status: DC
  Administered 2020-09-02 – 2020-09-06 (×7): 325 mg via ORAL
  Filled 2020-09-02 (×7): qty 1

## 2020-09-02 NOTE — Progress Notes (Signed)
  Progress Note    09/02/2020 7:57 AM 3 Days Post-Op  Subjective:  No complaints this morning   Vitals:   09/01/20 2055 09/02/20 0500  BP: (!) 129/50 (!) 137/50  Pulse: 76 61  Resp: 18 16  Temp: 99.5 F (37.5 C) 97.9 F (36.6 C)  SpO2: 96% 97%   Physical Exam: Lungs:  Non labored Extremities:  Palpable R ATA pulse; L groin without hematoma Abdomen:  soft Neurologic: A&O  CBC    Component Value Date/Time   WBC 11.3 (H) 09/02/2020 0554   RBC 3.34 (L) 09/02/2020 0554   HGB 8.3 (L) 09/02/2020 0554   HCT 25.6 (L) 09/02/2020 0554   PLT 374 09/02/2020 0554   MCV 76.6 (L) 09/02/2020 0554   MCH 24.9 (L) 09/02/2020 0554   MCHC 32.4 09/02/2020 0554   RDW 16.1 (H) 09/02/2020 0554   LYMPHSABS 1.9 08/26/2020 2034   MONOABS 0.8 08/26/2020 2034   EOSABS 0.1 08/26/2020 2034   BASOSABS 0.1 08/26/2020 2034    BMET    Component Value Date/Time   NA 136 08/31/2020 0234   K 4.0 08/31/2020 0234   CL 104 08/31/2020 0234   CO2 23 08/31/2020 0234   GLUCOSE 145 (H) 08/31/2020 0234   BUN 11 08/31/2020 0234   CREATININE 0.76 09/02/2020 0554   CALCIUM 8.1 (L) 08/31/2020 0234   GFRNONAA >60 09/02/2020 0554   GFRAA >60 01/16/2019 0714    INR    Component Value Date/Time   INR 1.0 08/09/2020 1700     Intake/Output Summary (Last 24 hours) at 09/02/2020 0758 Last data filed at 09/01/2020 1817 Gross per 24 hour  Intake 480 ml  Output 500 ml  Net -20 ml     Assessment/Plan:  75 y.o. female is s/p R SFA/pop stenting 3 Days Post-Op   R foot well perfused with palpable R ATA pulse Continue wound care BLE Plan is for LLE arteriogram on Wednesday with Dr. Elwyn Lade, PA-C [facility] [phone] 09/02/2020 7:58 AM

## 2020-09-02 NOTE — Progress Notes (Signed)
Physical Therapy Treatment Patient Details Name: Crystal Moore MRN: [id number] DOB: 12/14/1945 Today's Date: 09/02/2020    History of Present Illness Crystal Moore is a 75 year old female with past medical history significant for type 2 diabetes mellitus, peripheral vascular disease with vascular ulcer of left lateral ankle, history of CVA, essential hypertension, dementia, HLD, gout, OSA who presents to ED with right foot redness and darkening of the right great/second toe.  Bilateral lower extremity ischemia with bilateral foot ulceration.  Pt underwent RLE angiogram with R SFA / popliteal stenting on 2/4. Pt for LLE arteriogram on 2/9.    PT Comments    Pt continues to require assist for bed mobility and transfers. Per notes pt's family is planning on pt returning home. Pt will need assist with all mobility. Will need max HH support.    Follow Up Recommendations  Home health PT;Supervision for mobility/OOB (Will need assist with all mobility)     Equipment Recommendations  None recommended by PT    Recommendations for Other Services       Precautions / Restrictions Precautions Precautions: Fall    Mobility  Bed Mobility Overal bed mobility: Needs Assistance Bed Mobility: Supine to Sit     Supine to sit: Mod assist     General bed mobility comments: Assist to elevate trunk into sitting and bring hips to EOB  Transfers Overall transfer level: Needs assistance Equipment used: Ambulation equipment used Transfers: Sit to/from Stand Sit to Stand: Mod assist         General transfer comment: Assist to bring hips up and for balance. Pt  with decr weight bearing on LLE due to knee contracture and chronic weakness. Pt only able to use RUE on Stedy. Used Stedy for bed to chair.  Ambulation/Gait                 Stairs             Wheelchair Mobility    Modified Rankin (Stroke Patients Only)       Balance Overall balance assessment: Needs  assistance Sitting-balance support: No upper extremity supported Sitting balance-Leahy Scale: Fair     Standing balance support: Single extremity supported Standing balance-Leahy Scale: Poor Standing balance comment: Stedy and mod assist for static standing                            Cognition Arousal/Alertness: Awake/alert Behavior During Therapy: WFL for tasks assessed/performed Overall Cognitive Status: Difficult to assess                                 General Comments: presents with expressive difficulty and very HOH, improved with speaking into left ear and using yes/no questions      Exercises      General Comments        Pertinent Vitals/Pain Pain Assessment: Faces Faces Pain Scale: Hurts little more Pain Location: LLE Pain Descriptors / Indicators: Grimacing;Guarding    Home Living                      Prior Function            PT Goals (current goals can now be found in the care plan section) Progress towards PT goals: Progressing toward goals    Frequency    Min 3X/week      PT Plan  Discharge plan needs to be updated;Frequency needs to be updated    Co-evaluation              AM-PAC PT "6 Clicks" Mobility   Outcome Measure  Help needed turning from your back to your side while in a flat bed without using bedrails?: A Lot Help needed moving from lying on your back to sitting on the side of a flat bed without using bedrails?: A Lot Help needed moving to and from a bed to a chair (including a wheelchair)?: A Lot Help needed standing up from a chair using your arms (e.g., wheelchair or bedside chair)?: A Lot Help needed to walk in hospital room?: Total Help needed climbing 3-5 steps with a railing? : Total 6 Click Score: 10    End of Session Equipment Utilized During Treatment: Gait belt Activity Tolerance: Patient tolerated treatment well Patient left: with call bell/phone within reach;in chair;with  chair alarm set Nurse Communication: Mobility status;Need for lift equipment PT Visit Diagnosis: Other abnormalities of gait and mobility (R26.89);Muscle weakness (generalized) (M62.81)     Time: 3893-7342 PT Time Calculation (min) (ACUTE ONLY): 30 min  Charges:  $Therapeutic Activity: 23-37 mins                     [place] Pager [phone] Office [place] 09/02/2020, 2:47 PM

## 2020-09-02 NOTE — Progress Notes (Signed)
Occupational Therapy Treatment Patient Details Name: Crystal Moore MRN: [id number] DOB: 05/05/1946 Today's Date: 09/02/2020    History of present illness Crystal Moore is a 75 year old female with past medical history significant for type 2 diabetes mellitus, peripheral vascular disease with vascular ulcer of left lateral ankle, history of CVA, essential hypertension, dementia, HLD, gout, OSA who presents to ED with right foot redness and darkening of the right great/second toe.  Bilateral lower extremity ischemia with bilateral foot ulceration.  Pt underwent RLE angiogram with R SFA / popliteal stenting on 2/4. Pt for LLE arteriogram on 2/9.   OT comments  Pt making steady progress towards OT goals this session. Pt received seated in recliner able to state her name and agreeable to OT intervention. Pt very HOH making minimal efforts to verbalize during session but responding appropriately to yes/ no questions. Pt continues to present with  baseline cognitive deficits, pain, decreased activity tolerance, L sided weakness and generalized deconditioning. Pt currently requires MOD A +2 for sit<>stand with stedy and total A for posterior pericare in standing d/t incontinent BM. Dc plan currently remains appropriate pending appropriate family assist at home, pt for LLE arteriogram on 2/9. Will continue to follow acutely per POC and update DC recs as needed.    Follow Up Recommendations  No OT follow up    Equipment Recommendations  None recommended by OT    Recommendations for Other Services      Precautions / Restrictions Precautions Precautions: Fall Precaution Comments: hemi LUE Restrictions Weight Bearing Restrictions: No       Mobility Bed Mobility Overal bed mobility: Needs Assistance Bed Mobility: Sit to Supine     Supine to sit: Mod assist Sit to supine: Mod assist   General bed mobility comments: MOD A to elevate BLEs back to supine  Transfers Overall transfer level:  Needs assistance Equipment used: Ambulation equipment used Transfers: Sit to/from Stand Sit to Stand: Mod assist;+2 physical assistance         General transfer comment: MOD A to rise from recliner and from seat of stedy as pt with incontinent BM during transfer needing increased assist, total A to pivot with use of stedy    Balance Overall balance assessment: Needs assistance Sitting-balance support: No upper extremity supported Sitting balance-Leahy Scale: Fair Sitting balance - Comments: able to sit EOB with no UE support with close supervision and no LOB   Standing balance support: Single extremity supported Standing balance-Leahy Scale: Poor Standing balance comment: Stedy and mod assist for static standing                           ADL either performed or assessed with clinical judgement   ADL Overall ADL's : Needs assistance/impaired                         Toilet Transfer: +2 for physical assistance;Total assistance;Moderate assistance Toilet Transfer Details (indicate cue type and reason): MOD A +2 to rise to stedy from, total A to pivot back to bed bed with stedy Toileting- Clothing Manipulation and Hygiene: Total assistance;Sit to/from stand Toileting - Clothing Manipulation Details (indicate cue type and reason): pt with incontinent BM, total A for posterior pericare in standing     Functional mobility during ADLs: Moderate assistance;+2 for physical assistance General ADL Comments: pt continues to present with baseline cognitive deficits, pain, decreased activity tolerance and generalized deconditioning  Vision       Perception     Praxis      Cognition Arousal/Alertness: Awake/alert Behavior During Therapy: WFL for tasks assessed/performed Overall Cognitive Status: Difficult to assess                                 General Comments: very HOH, pt able to state name and responds appropriately to yes/ no questions         Exercises     Shoulder Instructions       General Comments VSS on RA    Pertinent Vitals/ Pain       Pain Assessment: Faces Faces Pain Scale: Hurts little more Pain Location: buttock when wipinf for pericare Pain Descriptors / Indicators: Grimacing;Discomfort;Moaning Pain Intervention(s): Monitored during session;Repositioned  Home Living                                          Prior Functioning/Environment              Frequency  Min 2X/week        Progress Toward Goals  OT Goals(current goals can now be found in the care plan section)  Progress towards OT goals: Progressing toward goals  Acute Rehab OT Goals Time For Goal Achievement: 09/12/20 Potential to Achieve Goals: [place] Discharge plan remains appropriate;Frequency remains appropriate    Co-evaluation                 AM-PAC OT "6 Clicks" Daily Activity     Outcome Measure   Help from another person eating meals?: A Little Help from another person taking care of personal grooming?: A Little Help from another person toileting, which includes using toliet, bedpan, or urinal?: A Lot Help from another person bathing (including washing, rinsing, drying)?: A Lot Help from another person to put on and taking off regular upper body clothing?: A Lot Help from another person to put on and taking off regular lower body clothing?: Total 6 Click Score: 13    End of Session Equipment Utilized During Treatment: Gait belt;Other (comment) (stedy)  OT Visit Diagnosis: Unsteadiness on feet (R26.81);Other abnormalities of gait and mobility (R26.89);Pain   Activity Tolerance Patient tolerated treatment well   Patient Left in bed;with call bell/phone within reach;with bed alarm set   Nurse Communication Mobility status        Time: 1459-1510 OT Time Calculation (min): 11 min  Charges: OT General Charges $OT Visit: 1 Visit OT Treatments $Self Care/Home Management :  8-22 mins  Harley Alto., COTA/L Acute Rehabilitation Services [place] 09/02/2020, 4:10 PM

## 2020-09-02 NOTE — Progress Notes (Signed)
CRITICAL VALUE ALERT  Critical Value:  Lactic Acid 2.1  Date & Time Notied:  09/02/20 1600  Provider Notified: Early Osmond MD  Orders Received/Actions taken: 0.9% Sodium Chloride infusion 150ml/hr

## 2020-09-02 NOTE — Care Management Important Message (Signed)
Important Message  Patient Details  Name: Crystal Moore MRN: [id number] Date of Birth: 1945/09/30   Medicare Important Message Given:  Yes     Shelda Altes 09/02/2020, 10:58 AM

## 2020-09-02 NOTE — Progress Notes (Signed)
PROGRESS NOTE    Crystal Moore  ZYY:[id number] DOB: 08/19/1945 DOA: 08/26/2020 PCP: [name], MD   Brief Narrative:  Crystal Moore is a 75 year old female with past medical history significant for type 2 diabetes mellitus, peripheral vascular disease with vascular ulcer of left lateral ankle, history of CVA, essential hypertension, dementia, HLD, gout, OSA who presents to ED with right foot redness and darkening of the right great/second toe.  Patient's followed outpatient by wound center and recent ABIs showing severe peripheral vascular disease in both legs.   In the ED, temperature 100.2, BP 156/89, HR 114, RR 23, SPO2 90% on room air.  WBC 15.1, hemoglobin 9.7.  X-ray right foot with no acute finding.  Chest x-ray with no acute cardiopulmonary disease process.  Recent ABI roughly 1 week ago with significant peripheral vascular disease and arterial occlusive disease in bilateral lower extremities with ABIs of 0.50 and 0.57 on the right and left respectively.  Patient was admitted to the hospitalist service for further evaluation and treatment of progressive peripheral vascular disease and cellulitis  Assessment & Plan:  Sepsis in the setting of right foot cellulitis: -Patient met sepsis criteria on admission with leukocytosis, tachycardia, tachypnea and fever. -X-ray of right foot shows no acute process.  She presented with worsening pain, redness in right foot. -Remained afebrile, leukocytosis slightly trended up from 10.8-11.3. -Continue doxycycline 100 mg p.o. twice daily and Augmentin 875-125 p.o. twice daily -Tylenol and morphine as needed for pain control  Severe bilateral peripheral vascular disease with ischemia Dry gangrene in right great/second toe Left lateral ankle ulcer: -S/p right SFA popliteal stenting by Dr. Criss Alvine: 3 -Plan is for left leg arteriogram on Wednesday with Dr. Luan Pulling -Continue aspirin, statin and Plavix -Appreciate vascular surgery's  recommendations  Type 2 diabetes mellitus uncontrolled with hyperglycemia: -A1c: 10.1% -Continue Lantus 25 units daily, Tradjenta 5 mg daily and sliding scale insulin  Diabetic neuropathy: Continue gabapentin  Microcytic anemia: -H&H stable between 8 and 9.  MCV: 76. -Check iron studies and monitor H&H closely.  Hypertension: Stable, continue amlodipine  Hyperlipidemia: Continue statin  Anxiety/depression: Continue Lexapro  History of ILD/COPD: Continue albuterol as needed.  Patient is maintaining oxygen saturation on room air.  No wheezing noted on exam  Dementia: -Continue with delirium precautions. -Minimize use of opioids/benzos.  Hypomagnesemia: Resolved  DVT prophylaxis: Lovenox Code Status: Full code Family Communication:  None present at bedside.  Plan of care discussed with patient in length and she verbalized understanding and agreed with it. Disposition Plan: Determined  Consultants:  Vascular surgery  Procedures:   Right SFA popliteal stenting  Antimicrobials:   Vancomycin 1/31 - 2/3  Cefepime 1/31 - 2/3  Flagyl 2/2 - 2/3  Doxycycline 2/3>>  Augmentin 2/3>>    Status is: Inpatient   Dispo: The patient is from: Home              Anticipated d/c is to: SNF              Anticipated d/c date is: > 3 days              Patient currently is not medically stable to d/c.   Difficult to place patient No    Subjective: Patient seen and examined.  Denies any complaints.  Remained afebrile.  No acute events overnight.  Objective: Vitals:   09/01/20 0420 09/01/20 1349 09/01/20 2055 09/02/20 0500  BP:  (!) 147/60 (!) 129/50 (!) 137/50  Pulse:  82 76 61  Resp:  _0 Temp: 98 F (36.7 C) 98 F (36.7 C) 99.5 F (37.5 C) 97.9 F (36.6 C)  TempSrc: Axillary Axillary Oral Axillary  SpO2:  97% 96% 97%  Weight:    59.8 kg  Height:        Intake/Output Summary (Last 24 hours) at 09/02/2020 1026 Last data filed at 09/01/2020 1817 Gross per 24  hour  Intake 240 ml  Output 500 ml  Net -260 ml   Filed Weights   08/30/20 0700 08/31/20 0300 09/02/20 0500  Weight: 61.5 kg 60.8 kg 59.8 kg    Examination:  General exam: Appears calm and comfortable, elderly African-American female, on nasal cannula, hard of hearing, alert and awake and communicating well respiratory system: Clear to auscultation. Respiratory effort normal. Cardiovascular system: S1 & S2 heard, RRR. No JVD, murmurs, rubs, gallops or clicks. No pedal edema. Gastrointestinal system: Abdomen is nondistended, soft and nontender. No organomegaly or masses felt. Normal bowel sounds heard. Central nervous system: Alert and awake and communicating well and following commands. Extremities: Dressing dry and intact- feet.  No swelling or redness in left groin. psychiatry: Judgement and insight appear normal. Mood & affect appropriate.    Data Reviewed: I have personally reviewed following labs and imaging studies  CBC: Recent Labs  Lab 08/26/20 2034 08/27/20 0309 08/28/20 0339 08/29/20 0322 08/31/20 0234 09/01/20 0255 09/02/20 0554  WBC 15.1*   < > 14.3* 11.4* 9.6 10.8* 11.3*  NEUTROABS 12.2*  --   --   --   --   --   --   HGB 9.7*   < > 8.9* 8.8* 8.2* 8.1* 8.3*  HCT 30.8*   < > 28.7* 28.3* 26.3* 24.8* 25.6*  MCV 78.6*   < > 79.5* 78.6* 77.6* 76.5* 76.6*  PLT 389   < > 335 331 346 351 374   < > = values in this interval not displayed.   Basic Metabolic Panel: Recent Labs  Lab 08/26/20 2034 08/27/20 0309 08/28/20 0339 08/29/20 0322 08/31/20 0234 09/01/20 0255 09/02/20 0554  NA 136 139 135 137 136  --   --   K 4.0 3.6 4.3 4.1 4.0  --   --   CL 100 106 102 103 104  --   --   CO2 _1 --   --   GLUCOSE 334* 208* 184* 116* 145*  --   --   BUN _2 --   --   CREATININE 0.87 0.69 0.74 0.80 0.61  --  0.76  CALCIUM 9.3 8.8* 8.7* 8.6* 8.1*  --   --   MG  --   --   --   --  1.6* 1.8  --    GFR: Estimated Creatinine Clearance: 49.5  mL/min (by C-G formula based on SCr of 0.76 mg/dL). Liver Function Tests: Recent Labs  Lab 08/27/20 0309 08/28/20 0339 08/29/20 0322  AST 12* 14* 13*  ALT _3 ALKPHOS 95 84 76  BILITOT 0.4 0.4 0.5  PROT 7.1 6.8 6.8  ALBUMIN 2.9* 2.7* 2.7*   No results for input(s): LIPASE, AMYLASE in the last 168 hours. No results for input(s): AMMONIA in the last 168 hours. Coagulation Profile: No results for input(s): INR, PROTIME in the last 168 hours. Cardiac Enzymes: No results for input(s): CKTOTAL, CKMB, CKMBINDEX, TROPONINI in the last 168 hours. BNP (last 3 results) No results for input(s): PROBNP in the last  8760 hours. HbA1C: No results for input(s): HGBA1C in the last 72 hours. CBG: Recent Labs  Lab 09/01/20 0726 09/01/20 1122 09/01/20 1647 09/01/20 2138 09/02/20 0743  GLUCAP 93 176* 232* 217* 150*   Lipid Profile: No results for input(s): CHOL, HDL, LDLCALC, TRIG, CHOLHDL, LDLDIRECT in the last 72 hours. Thyroid Function Tests: No results for input(s): TSH, T4TOTAL, FREET4, T3FREE, THYROIDAB in the last 72 hours. Anemia Panel: No results for input(s): VITAMINB12, FOLATE, FERRITIN, TIBC, IRON, RETICCTPCT in the last 72 hours. Sepsis Labs: Recent Labs  Lab 08/27/20 1716 08/27/20 1929  LATICACIDVEN 1.6 2.4*    Recent Results (from the past 240 hour(s))  SARS Coronavirus 2 by RT PCR (hospital order, performed in [facility] hospital lab) Nasopharyngeal Nasopharyngeal Swab     Status: None   Collection Time: 08/26/20  8:56 PM   Specimen: Nasopharyngeal Swab  Result Value Ref Range Status   SARS Coronavirus 2 NEGATIVE NEGATIVE Final    Comment: (NOTE) SARS-CoV-2 target nucleic acids are NOT DETECTED.  The SARS-CoV-2 RNA is generally detectable in upper and lower respiratory specimens during the acute phase of infection. The lowest concentration of SARS-CoV-2 viral copies this assay can detect is 250 copies / mL. A negative result does not preclude SARS-CoV-2  infection and should not be used as the sole basis for treatment or other patient management decisions.  A negative result may occur with improper specimen collection / handling, submission of specimen other than nasopharyngeal swab, presence of viral mutation(s) within the areas targeted by this assay, and inadequate number of viral copies (<250 copies / mL). A negative result must be combined with clinical observations, patient history, and epidemiological information.  Fact Sheet for Patients:   StrictlyIdeas.no  Fact Sheet for Healthcare Providers: BankingDealers.co.za  This test is not yet approved or  cleared by the [place] FDA and has been authorized for detection and/or diagnosis of SARS-CoV-2 by FDA under an Emergency Use Authorization (EUA).  This EUA will remain in effect (meaning this test can be used) for the duration of the COVID-19 declaration under Section 564(b)(1) of the Act, 21 U.S.C. section 360bbb-3(b)(1), unless the authorization is terminated or revoked sooner.  Performed at [facility], [place] [address], [place], [place] [address]   Culture, blood (routine x 2)     Status: None   Collection Time: 08/26/20  9:06 PM   Specimen: BLOOD  Result Value Ref Range Status   Specimen Description   Final    BLOOD RIGHT WRIST Performed at [place] [address], [place], [place] [address]    Special Requests   Final    BOTTLES DRAWN AEROBIC ONLY Blood Culture results may not be optimal due to an inadequate volume of blood received in culture bottles Performed at [place] [address], [place], [place] [address]    Culture   Final    NO GROWTH 5 DAYS Performed at [facility], [place] [address] [place], [place] [address]    Report Status 08/31/2020 FINAL  Final  Culture, blood (routine x 2)     Status: None   Collection Time: 08/26/20  9:14  PM   Specimen: BLOOD  Result Value Ref Range Status   Specimen Description   Final    BLOOD RIGHT ANTECUBITAL Performed at [place] [address], [place],  [address]    Special Requests   Final    BOTTLES DRAWN AEROBIC AND  ANAEROBIC Blood Culture results may not be optimal due to an excessive volume of blood received in culture bottles Performed at [place] [address] [place], [place] [address]    Culture   Final    NO GROWTH 5 DAYS Performed at [facility], [place] [address], [place], [place] [address]    Report Status 08/31/2020 FINAL  Final      Radiology Studies: No results found.  Scheduled Meds: . amLODipine  5 mg Oral Daily  . amoxicillin-clavulanate  1 tablet Oral Q12H  . aspirin EC  81 mg Oral Daily  . atorvastatin  80 mg Oral Daily  . clopidogrel  75 mg Oral Q breakfast  . doxycycline  100 mg Oral Q12H  . enoxaparin (LOVENOX) injection  40 mg Subcutaneous Q24H  . escitalopram  10 mg Oral Daily  . feeding supplement  237 mL Oral Daily  . gabapentin  300 mg Oral TID  . insulin aspart  0-15 Units Subcutaneous TID WC  . insulin aspart  0-5 Units Subcutaneous QHS  . insulin glargine  25 Units Subcutaneous Daily  . linagliptin  5 mg Oral Daily  . sodium chloride flush  3 mL Intravenous Q12H   Continuous Infusions: . sodium chloride       LOS: 7 days   Time spent: 35 minutes   Baldemar Dady Loann Quill, MD [facility]  If 7PM-7AM, please contact night-coverage [email or web address] 09/02/2020, 10:26 AM

## 2020-09-03 ENCOUNTER — Other Ambulatory Visit (HOSPITAL_COMMUNITY): Payer: Medicare Other

## 2020-09-03 DIAGNOSIS — L03031 Cellulitis of right toe: Secondary | ICD-10-CM | POA: Diagnosis not present

## 2020-09-03 LAB — CBC
HCT: 24.4 % — ABNORMAL LOW (ref 36.0–46.0)
Hemoglobin: 7.6 g/dL — ABNORMAL LOW (ref 12.0–15.0)
MCH: 24.6 pg — ABNORMAL LOW (ref 26.0–34.0)
MCHC: 31.1 g/dL (ref 30.0–36.0)
MCV: 79 fL — ABNORMAL LOW (ref 80.0–100.0)
Platelets: 348 10*3/uL (ref 150–400)
RBC: 3.09 MIL/uL — ABNORMAL LOW (ref 3.87–5.11)
RDW: 16.3 % — ABNORMAL HIGH (ref 11.5–15.5)
WBC: 10 10*3/uL (ref 4.0–10.5)
nRBC: 0 % (ref 0.0–0.2)

## 2020-09-03 LAB — GLUCOSE, CAPILLARY
Glucose-Capillary: 123 mg/dL — ABNORMAL HIGH (ref 70–99)
Glucose-Capillary: 144 mg/dL — ABNORMAL HIGH (ref 70–99)
Glucose-Capillary: 122 mg/dL — ABNORMAL HIGH (ref 70–99)
Glucose-Capillary: 98 mg/dL (ref 70–99)

## 2020-09-03 NOTE — Progress Notes (Signed)
PROGRESS NOTE    Crystal Moore  ACZ:[id number] DOB: October 12, 1945 DOA: 08/26/2020 PCP: [name], MD   Brief Narrative:  Crystal Moore is a 75 year old female with past medical history significant for type 2 diabetes mellitus, peripheral vascular disease with vascular ulcer of left lateral ankle, history of CVA, essential hypertension, dementia, HLD, gout, OSA who presents to ED with right foot redness and darkening of the right great/second toe.  Patient's followed outpatient by wound center and recent ABIs showing severe peripheral vascular disease in both legs.   In the ED, temperature 100.2, BP 156/89, HR 114, RR 23, SPO2 90% on room air.  WBC 15.1, hemoglobin 9.7.  X-ray right foot with no acute finding.  Chest x-ray with no acute cardiopulmonary disease process.  Recent ABI roughly 1 week ago with significant peripheral vascular disease and arterial occlusive disease in bilateral lower extremities with ABIs of 0.50 and 0.57 on the right and left respectively.  Patient was admitted to the hospitalist service for further evaluation and treatment of progressive peripheral vascular disease and cellulitis  Assessment & Plan:  Sepsis in the setting of right foot cellulitis: -Patient met sepsis criteria on admission with leukocytosis, tachycardia, tachypnea and fever. -X-ray of right foot shows no acute process.  She presented with worsening pain, redness in right foot. -Remained afebrile, leukocytosis and lactic acidosis: Resolved. -Patient started on Vanco, Flagyl and cefepime on admission and then switched to-doxycycline 100 mg p.o. twice daily and Augmentin 875-125 p.o. twice daily on 2/4 -Tylenol and morphine as needed for pain control  Severe bilateral peripheral vascular disease with ischemia Dry gangrene in right great/second toe Left lateral ankle ulcer: -S/p right SFA popliteal stenting by Dr. Criss Alvine: 4 -Plan is for left leg arteriogram tomorrow with Dr.  Luan Pulling -Continue aspirin, statin and Plavix -Appreciate vascular surgery's recommendations -PT recommended home health PT.  OT recommended no OT follow-up. -N.p.o. after midnight.  Type 2 diabetes mellitus uncontrolled with hyperglycemia: -A1c: 10.1% -Blood sugar improving.  Continue Lantus 25 units daily, Tradjenta 5 mg daily and sliding scale insulin  Diabetic neuropathy: Continue gabapentin  Microcytic anemia: -H&H stable between 8 and 9.  MCV: 76. -Iron studies suggestive of iron deficiency.  Started patient on ferrous sulfate twice daily.  Hypertension: Stable, continue amlodipine  Hyperlipidemia: Continue statin  Anxiety/depression: Continue Lexapro  History of ILD/COPD: Continue albuterol as needed.  Patient is maintaining oxygen saturation on room air.  No wheezing noted on exam  Dementia: -Continue with delirium precautions. -Minimize use of opioids/benzos.  Hypomagnesemia: Resolved  DVT prophylaxis: Lovenox Code Status: Full code Family Communication:  None present at bedside.  Plan of care discussed with patient in length and she verbalized understanding and agreed with it.  I called patient's daughter to and discussed plan of care and she verbalized understanding.   Disposition Plan: TBD  Consultants:  Vascular surgery  Procedures:   Right SFA popliteal stenting  Antimicrobials:   Vancomycin 1/31 - 2/3  Cefepime 1/31 - 2/3  Flagyl 2/2 - 2/3  Doxycycline 2/3>>  Augmentin 2/3>>    Status is: Inpatient   Dispo: The patient is from: Home              Anticipated d/c is to: SNF              Anticipated d/c date is: > 3 days              Patient currently is not medically stable to d/c.  Difficult to place patient No    Subjective: Patient seen and examined.  Sitting comfortably on the bed and eating breakfast.  Denies any new complaints.  Remained afebrile overnight.  No acute events overnight.  Objective: Vitals:   09/02/20 0500  09/02/20 1414 09/02/20 2007 09/03/20 0517  BP: (!) 137/50 (!) 102/46 (!) 133/55 (!) 131/49  Pulse: 61 77 72 68  Resp: _0 Temp: 97.9 F (36.6 C) (!) 97.5 F (36.4 C) 97.9 F (36.6 C) 98.3 F (36.8 C)  TempSrc: Axillary Axillary Oral Axillary  SpO2: 97% 90% 100% 98%  Weight: 59.8 kg   62.5 kg  Height:        Intake/Output Summary (Last 24 hours) at 09/03/2020 1007 Last data filed at 09/03/2020 4782 Gross per 24 hour  Intake 324.9 ml  Output 950 ml  Net -625.1 ml   Filed Weights   08/31/20 0300 09/02/20 0500 09/03/20 0517  Weight: 60.8 kg 59.8 kg 62.5 kg    Examination:  General exam: Appears calm and comfortable, elderly African-American female, on nasal cannula, hard of hearing, alert and awake and communicating well, eating breakfast. respiratory system: Clear to auscultation. Respiratory effort normal. Cardiovascular system: S1 & S2 heard, RRR. No JVD, murmurs, rubs, gallops or clicks. No pedal edema. Gastrointestinal system: Abdomen is nondistended, soft and nontender. No organomegaly or masses felt. Normal bowel sounds heard. Central nervous system: Alert and awake and communicating well and following commands. Extremities: Dressing dry and intact- feet.  psychiatry: Judgement and insight appear normal. Mood & affect appropriate.    Data Reviewed: I have personally reviewed following labs and imaging studies  CBC: Recent Labs  Lab 08/29/20 0322 08/31/20 0234 09/01/20 0255 09/02/20 0554 09/03/20 0302  WBC 11.4* 9.6 10.8* 11.3* 10.0  HGB 8.8* 8.2* 8.1* 8.3* 7.6*  HCT 28.3* 26.3* 24.8* 25.6* 24.4*  MCV 78.6* 77.6* 76.5* 76.6* 79.0*  PLT 331 346 351 374 956   Basic Metabolic Panel: Recent Labs  Lab 08/28/20 0339 08/29/20 0322 08/31/20 0234 09/01/20 0255 09/02/20 0554  NA 135 137 136  --   --   K 4.3 4.1 4.0  --   --   CL 102 103 104  --   --   CO2 _1 --   --   GLUCOSE 184* 116* 145*  --   --   BUN _2 --   --   CREATININE 0.74 0.80  0.61  --  0.76  CALCIUM 8.7* 8.6* 8.1*  --   --   MG  --   --  1.6* 1.8  --    GFR: Estimated Creatinine Clearance: 54.5 mL/min (by C-G formula based on SCr of 0.76 mg/dL). Liver Function Tests: Recent Labs  Lab 08/28/20 0339 08/29/20 0322  AST 14* 13*  ALT 15 14  ALKPHOS 84 76  BILITOT 0.4 0.5  PROT 6.8 6.8  ALBUMIN 2.7* 2.7*   No results for input(s): LIPASE, AMYLASE in the last 168 hours. No results for input(s): AMMONIA in the last 168 hours. Coagulation Profile: No results for input(s): INR, PROTIME in the last 168 hours. Cardiac Enzymes: No results for input(s): CKTOTAL, CKMB, CKMBINDEX, TROPONINI in the last 168 hours. BNP (last 3 results) No results for input(s): PROBNP in the last 8760 hours. HbA1C: No results for input(s): HGBA1C in the last 72 hours. CBG: Recent Labs  Lab 09/02/20 0743 09/02/20 1138 09/02/20 1700 09/02/20 2053 09/03/20 0749  GLUCAP 150* 250*  208* 194* 123*   Lipid Profile: No results for input(s): CHOL, HDL, LDLCALC, TRIG, CHOLHDL, LDLDIRECT in the last 72 hours. Thyroid Function Tests: No results for input(s): TSH, T4TOTAL, FREET4, T3FREE, THYROIDAB in the last 72 hours. Anemia Panel: Recent Labs    09/02/20 1041  FERRITIN 18  TIBC 252  IRON 22*   Sepsis Labs: Recent Labs  Lab 08/27/20 1716 08/27/20 1929 09/02/20 1455 09/02/20 1748  LATICACIDVEN 1.6 2.4* 2.1* 1.8    Recent Results (from the past 240 hour(s))  SARS Coronavirus 2 by RT PCR (hospital order, performed in [facility] hospital lab) Nasopharyngeal Nasopharyngeal Swab     Status: None   Collection Time: 08/26/20  8:56 PM   Specimen: Nasopharyngeal Swab  Result Value Ref Range Status   SARS Coronavirus 2 NEGATIVE NEGATIVE Final    Comment: (NOTE) SARS-CoV-2 target nucleic acids are NOT DETECTED.  The SARS-CoV-2 RNA is generally detectable in upper and lower respiratory specimens during the acute phase of infection. The lowest concentration of SARS-CoV-2 viral  copies this assay can detect is 250 copies / mL. A negative result does not preclude SARS-CoV-2 infection and should not be used as the sole basis for treatment or other patient management decisions.  A negative result may occur with improper specimen collection / handling, submission of specimen other than nasopharyngeal swab, presence of viral mutation(s) within the areas targeted by this assay, and inadequate number of viral copies (<250 copies / mL). A negative result must be combined with clinical observations, patient history, and epidemiological information.  Fact Sheet for Patients:   StrictlyIdeas.no  Fact Sheet for Healthcare Providers: BankingDealers.co.za  This test is not yet approved or  cleared by the [place] FDA and has been authorized for detection and/or diagnosis of SARS-CoV-2 by FDA under an Emergency Use Authorization (EUA).  This EUA will remain in effect (meaning this test can be used) for the duration of the COVID-19 declaration under Section 564(b)(1) of the Act, 21 U.S.C. section 360bbb-3(b)(1), unless the authorization is terminated or revoked sooner.  Performed at [facility], [place] [address] [place], [place] [address]   Culture, blood (routine x 2)     Status: None   Collection Time: 08/26/20  9:06 PM   Specimen: BLOOD  Result Value Ref Range Status   Specimen Description   Final    BLOOD RIGHT WRIST Performed at [facility] [address], [place], [place] [address]    Special Requests   Final    BOTTLES DRAWN AEROBIC ONLY Blood Culture results may not be optimal due to an inadequate volume of blood received in culture bottles Performed at [facility] [address] [place], [place] [address]    Culture   Final    NO GROWTH 5 DAYS Performed at [facility], [place] [address] [place], [place] [address]    Report Status  08/31/2020 FINAL  Final  Culture, blood (routine x 2)     Status: None   Collection Time: 08/26/20  9:14 PM   Specimen: BLOOD  Result Value Ref Range Status   Specimen Description   Final    BLOOD RIGHT ANTECUBITAL Performed at [facility] [address], [place], [place] [address]    Special Requests   Final    BOTTLES DRAWN AEROBIC AND ANAEROBIC Blood Culture results may not be optimal due to an excessive volume of blood received in culture bottles Performed at [facility], [place] [address], [place], [place] [address]    Culture   Final    NO GROWTH 5 DAYS Performed at [facility], [place] [address] [place], [place] [address]    Report Status 08/31/2020 FINAL  Final      Radiology Studies: No results found.  Scheduled Meds: . amLODipine  5 mg Oral Daily  . amoxicillin-clavulanate  1 tablet Oral Q12H  . aspirin EC  81 mg Oral Daily  . atorvastatin  80 mg Oral Daily  . clopidogrel  75 mg Oral Q breakfast  . doxycycline  100 mg Oral Q12H  . enoxaparin (LOVENOX) injection  40 mg Subcutaneous Q24H  . escitalopram  10 mg Oral Daily  . feeding supplement  237 mL Oral Daily  . ferrous sulfate  325 mg Oral BID WC  . gabapentin  300 mg Oral TID  . insulin aspart  0-15 Units Subcutaneous TID WC  . insulin aspart  0-5 Units Subcutaneous QHS  . insulin glargine  25 Units Subcutaneous Daily  . linagliptin  5 mg Oral Daily  . sodium chloride flush  3 mL Intravenous Q12H   Continuous Infusions: . sodium chloride    . sodium chloride 100 mL/hr at 09/03/20 0203     LOS: 8 days   Time spent: 35 minutes   Abu Heavin Loann Quill, MD [facility]  If 7PM-7AM, please contact night-coverage [email or web address] 09/03/2020, 10:07 AM

## 2020-09-04 ENCOUNTER — Encounter (HOSPITAL_COMMUNITY)
Admission: EM | Disposition: A | Discharge status: Home Health | Payer: Self-pay | Source: Home / Self Care | Attending: Internal Medicine

## 2020-09-04 ENCOUNTER — Encounter (HOSPITAL_COMMUNITY): Payer: Self-pay | Admitting: Vascular Surgery

## 2020-09-04 DIAGNOSIS — L03031 Cellulitis of right toe: Secondary | ICD-10-CM | POA: Diagnosis not present

## 2020-09-04 DIAGNOSIS — L97321 Non-pressure chronic ulcer of left ankle limited to breakdown of skin: Secondary | ICD-10-CM | POA: Diagnosis not present

## 2020-09-04 HISTORY — PX: ABDOMINAL AORTOGRAM W/LOWER EXTREMITY: CATH118223

## 2020-09-04 HISTORY — PX: PERIPHERAL VASCULAR INTERVENTION: CATH118257

## 2020-09-04 LAB — POCT ACTIVATED CLOTTING TIME
Activated Clotting Time: 136 seconds
Activated Clotting Time: 154 seconds
Activated Clotting Time: 225 seconds

## 2020-09-04 LAB — CBC
HCT: 27.6 % — ABNORMAL LOW (ref 36.0–46.0)
Hemoglobin: 8.5 g/dL — ABNORMAL LOW (ref 12.0–15.0)
MCH: 24.1 pg — ABNORMAL LOW (ref 26.0–34.0)
MCHC: 30.8 g/dL (ref 30.0–36.0)
MCV: 78.4 fL — ABNORMAL LOW (ref 80.0–100.0)
Platelets: 414 10*3/uL — ABNORMAL HIGH (ref 150–400)
RBC: 3.52 MIL/uL — ABNORMAL LOW (ref 3.87–5.11)
RDW: 16.1 % — ABNORMAL HIGH (ref 11.5–15.5)
WBC: 9.8 10*3/uL (ref 4.0–10.5)
nRBC: 0 % (ref 0.0–0.2)

## 2020-09-04 LAB — BASIC METABOLIC PANEL
Anion gap: 9 (ref 5–15)
BUN: 10 mg/dL (ref 8–23)
CO2: 26 mmol/L (ref 22–32)
Calcium: 9.1 mg/dL (ref 8.9–10.3)
Chloride: 101 mmol/L (ref 98–111)
Creatinine, Ser: 0.61 mg/dL (ref 0.44–1.00)
GFR, Estimated: >60 mL/min (ref >60)
Glucose, Bld: 104 mg/dL — ABNORMAL HIGH (ref 70–99)
Potassium: 4 mmol/L (ref 3.5–5.1)
Sodium: 136 mmol/L (ref 135–145)

## 2020-09-04 LAB — GLUCOSE, CAPILLARY
Glucose-Capillary: 83 mg/dL (ref 70–99)
Glucose-Capillary: 236 mg/dL — ABNORMAL HIGH (ref 70–99)
Glucose-Capillary: 122 mg/dL — ABNORMAL HIGH (ref 70–99)

## 2020-09-04 SURGERY — ABDOMINAL AORTOGRAM W/LOWER EXTREMITY
Anesthesia: LOCAL

## 2020-09-04 MED ORDER — TRAMADOL HCL 50 MG PO TABS
25.0000 mg | ORAL_TABLET | Freq: Two times a day (BID) | ORAL | Status: DC | PRN
  Administered 2020-09-06: 25 mg via ORAL
  Filled 2020-09-04: qty 1

## 2020-09-04 MED ORDER — LABETALOL HCL 5 MG/ML IV SOLN: 10.0000 mg | INTRAVENOUS | Status: DC | PRN

## 2020-09-04 MED ORDER — HYDRALAZINE HCL 20 MG/ML IJ SOLN: 5.0000 mg | INTRAMUSCULAR | Status: DC | PRN

## 2020-09-04 MED ORDER — ONDANSETRON HCL 4 MG/2ML IJ SOLN: 4.0000 mg | Freq: Four times a day (QID) | INTRAMUSCULAR | Status: DC | PRN

## 2020-09-04 MED ORDER — HEPARIN SODIUM (PORCINE) 1000 UNIT/ML IJ SOLN
INTRAMUSCULAR | Status: DC | PRN
  Administered 2020-09-04: 5000 [IU] via INTRAVENOUS
  Administered 2020-09-04: 7000 [IU] via INTRAVENOUS
  Administered 2020-09-04: 3000 [IU] via INTRAVENOUS

## 2020-09-04 MED ORDER — ACETAMINOPHEN 325 MG PO TABS: 650.0000 mg | ORAL_TABLET | ORAL | Status: DC | PRN

## 2020-09-04 MED ORDER — HEPARIN (PORCINE) IN NACL 1000-0.9 UT/500ML-% IV SOLN
INTRAVENOUS | Status: DC | PRN
  Administered 2020-09-04: 500 mL

## 2020-09-04 MED ORDER — HEPARIN SODIUM (PORCINE) 1000 UNIT/ML IJ SOLN
INTRAMUSCULAR | Status: AC
  Filled 2020-09-04: qty 1

## 2020-09-04 MED ORDER — INSULIN GLARGINE 100 UNIT/ML ~~LOC~~ SOLN
20.0000 [IU] | Freq: Every day | SUBCUTANEOUS | Status: DC
  Administered 2020-09-04 – 2020-09-06 (×3): 20 [IU] via SUBCUTANEOUS
  Filled 2020-09-04 (×3): qty 0.2

## 2020-09-04 MED ORDER — SODIUM CHLORIDE 0.9% FLUSH
3.0000 mL | Freq: Two times a day (BID) | INTRAVENOUS | Status: DC
  Administered 2020-09-05 – 2020-09-06 (×4): 3 mL via INTRAVENOUS

## 2020-09-04 MED ORDER — IODIXANOL 320 MG/ML IV SOLN
INTRAVENOUS | Status: DC | PRN
  Administered 2020-09-04: 65 mL via INTRA_ARTERIAL

## 2020-09-04 MED ORDER — LIDOCAINE HCL (PF) 1 % IJ SOLN
INTRAMUSCULAR | Status: AC
  Filled 2020-09-04: qty 30

## 2020-09-04 MED ORDER — SODIUM CHLORIDE 0.9% FLUSH: 3.0000 mL | INTRAVENOUS | Status: DC | PRN

## 2020-09-04 MED ORDER — HEPARIN (PORCINE) IN NACL 1000-0.9 UT/500ML-% IV SOLN
INTRAVENOUS | Status: AC
  Filled 2020-09-04: qty 1000

## 2020-09-04 MED ORDER — SODIUM CHLORIDE 0.9 % IV SOLN
250.0000 mL | INTRAVENOUS | Status: DC | PRN
Start: 2020-09-05 — End: 2020-09-06

## 2020-09-04 MED ORDER — SODIUM CHLORIDE 0.9 % WEIGHT BASED INFUSION
1.0000 mL/kg/h | INTRAVENOUS | Status: AC
  Administered 2020-09-04: 1 mL/kg/h via INTRAVENOUS

## 2020-09-04 MED ORDER — HEPARIN SODIUM (PORCINE) 5000 UNIT/ML IJ SOLN
5000.0000 [IU] | Freq: Three times a day (TID) | INTRAMUSCULAR | Status: DC
  Administered 2020-09-04 – 2020-09-06 (×5): 5000 [IU] via SUBCUTANEOUS
  Filled 2020-09-04 (×5): qty 1

## 2020-09-04 MED ORDER — LIDOCAINE HCL (PF) 1 % IJ SOLN
INTRAMUSCULAR | Status: DC | PRN
  Administered 2020-09-04: 15 mL via INTRADERMAL
  Administered 2020-09-04: 3 mL

## 2020-09-04 SURGICAL SUPPLY — 35 items
BALLN STERLING OTW 3X40X150 (BALLOONS) ×3
BALLN STERLING OTW 4X220X150 (BALLOONS) ×3
BALLOON STERLING OTW 3X40X150 (BALLOONS) ×2 IMPLANT
BALLOON STERLING OTW 4X220X150 (BALLOONS) ×2 IMPLANT
CATH CXI 2.6F 65 ST (CATHETERS) ×3
CATH CXI 2.6F 90 ANG (CATHETERS) ×3
CATH MUSTANG 3X40X135 (BALLOONS) ×3 IMPLANT
CATH OMNI FLUSH 5F 65CM (CATHETERS) ×3 IMPLANT
CATH QUICKCROSS .035X135CM (MICROCATHETER) ×3 IMPLANT
CATH SPRT ANG 90X2.3FR ACPT (CATHETERS) ×2 IMPLANT
CATH SPRT STRG 65X2.6FR ACPT (CATHETERS) ×2 IMPLANT
CLOSURE PERCLOSE PROSTYLE (VASCULAR PRODUCTS) ×6 IMPLANT
DEVICE ONE SNARE 10MM (MISCELLANEOUS) ×3 IMPLANT
GLIDEWIRE ADV .035X260CM (WIRE) ×3 IMPLANT
GLIDEWIRE ANGLED NITR .018X260 (WIRE) ×3 IMPLANT
KIT ENCORE 26 ADVANTAGE (KITS) ×6 IMPLANT
KIT MICROPUNCTURE NIT STIFF (SHEATH) ×3 IMPLANT
KIT PV (KITS) ×3 IMPLANT
PATCH THROMBIX TOPICAL PLAIN (HEMOSTASIS) ×3 IMPLANT
SHEATH GLIDE SLENDER 4/5FR (SHEATH) ×3 IMPLANT
SHEATH MICROPUNCTURE PEDAL 4FR (SHEATH) ×3 IMPLANT
SHEATH PINNACLE 5F 10CM (SHEATH) ×3 IMPLANT
SHEATH PINNACLE 6F 10CM (SHEATH) ×3 IMPLANT
SHEATH PINNACLE ST 6F 45CM (SHEATH) ×6 IMPLANT
SHEATH PROBE COVER 6X72 (BAG) ×3 IMPLANT
STENT INNOVA 5X100X130 (Permanent Stent) ×6 IMPLANT
STENT INNOVA 5X120X130 (Permanent Stent) ×3 IMPLANT
STENT INNOVA 5X150X130 (Permanent Stent) ×3 IMPLANT
STENT INNOVA 5X40X130 (Permanent Stent) ×3 IMPLANT
STENT INNOVA 5X80X130 (Permanent Stent) ×3 IMPLANT
TRANSDUCER W/STOPCOCK (MISCELLANEOUS) ×3 IMPLANT
TRAY PV CATH (CUSTOM PROCEDURE TRAY) ×3 IMPLANT
WIRE ASAHI CONFIANZPRO12 300CM (WIRE) ×3 IMPLANT
WIRE G V18X300CM (WIRE) ×3 IMPLANT
WIRE STARTER BENTSON 035X150 (WIRE) ×3 IMPLANT

## 2020-09-04 NOTE — Progress Notes (Signed)
PT Cancellation Note  Patient Details Name: Crystal Moore MRN: [id number] DOB: December 26, 1945   Cancelled Treatment:    Reason Eval/Treat Not Completed: Patient at procedure or test/unavailable Patient off unit at procedure. PT will re-attempt session as time allows.  Karyn Brull A. Gilford Rile PT, DPT Acute Rehabilitation Services Pager [phone] Office [phone]    Alda Lea 09/04/2020, 1:07 PM

## 2020-09-04 NOTE — Progress Notes (Signed)
PROGRESS NOTE    Crystal Moore  XLK:[id number] DOB: 01/29/1946 DOA: 08/26/2020 PCP: [name], MD    No chief complaint on file.   Brief Narrative:  75 year old lady prior history of type 2 diabetes mellitus, peripheral vascular disease, unknown healing ulcer on the left lateral ankle, history of CVA, essential hypertension, dementia, hyperlipidemia, gout, obstructive sleep apnea on 2 L of nasal cannula oxygen at home presents to ED with redness of the right great toe and second toe with swelling. ABIs done as an outpatient show severe peripheral vascular disease in both lower extremities. On arrival to ED patient was febrile, tachycardic with a heart rate of 114/min tachypneic 23/min, leukocytosis 15.1. Patient was admitted to the hospitalist service for further evaluation of peripheral vascular disease and cellulitis. She underwent SFA popliteal stenting by vascular surgery on 08/30/20 and is scheduled for left leg arteriogram on 09/04/20.   Patient seen and examined No pain in the right lower extremity but reports pain in the left ankle and foot. Assessment & Plan:   Principal Problem:   Cellulitis of great toe of right foot Active Problems:   COPD (chronic obstructive pulmonary disease) (HCC)   ILD (interstitial lung disease) (HCC)   Sepsis (HCC)   Hypertension   Type 2 diabetes mellitus with vascular disease ([place])   Ankle ulcer ([place])   Sepsis from foot cellulitis:  Patient met sepsis criteria on admission with leukocytosis, tachycardia, tach tachypnea and fever. X-rays of the right foot shows no osteomyelitis. Cellulitis improving. Patient was started on broad-spectrum IV antibiotics and switched to Augmentin and oral doxycycline on 08/30/20. Pain control and physical therapy evaluation.    Severe bilateral peripheral vascular disease with ischemia Dry gangrene in the right great and second toe Left lateral ankle ulcer nonhealing S/p right SFA popliteal stenting  by vascular surgery on 08/30/20 Plan for left leg arteriogram on 09/04/20. Meanwhile continue with aspirin statin and Plavix. Therapy evaluation recommending home health PT    Type 2 diabetes mellitus uncontrolled with hyperglycemia Last A1c is around 10.1% CBG (last 3)  Recent Labs    09/03/20 1701 09/03/20 2108 09/04/20 0745  GLUCAP 122* 98 83   CBGs have been low since last night we will decrease Lantus from 25 units to 20 units. Continue sliding scale insulin.    Diabetic neuropathy Continue with gabapentin.   Anemia of chronic disease/microcytic anemia Iron studies shows iron deficiency. Started the patient on ferrous sulfate twice daily.     Essential hypertension Blood pressure parameters appear to be optimal.   COPD/ILD Patient son reports that she is on 2 L of nasal cannula oxygen at home which she intermittently uses. Continue 2 L of nasal cannula oxygen. No wheezing heard on exam today.    Mild dementia No behavioral abnormalities.   Hyperlipidemia Continue with statin.   History of anxiety and depression Continue with Lexapro.       DVT prophylaxis: (Lovenox) Code Status: (Full code) Family Communication: son at bedside.  Disposition:   Status is: Inpatient  Remains inpatient appropriate because:Ongoing diagnostic testing needed not appropriate for outpatient work up, IV treatments appropriate due to intensity of illness or inability to take PO and Inpatient level of care appropriate due to severity of illness   Dispo: The patient is from: Home              Anticipated d/c is to: pending              Anticipated d/c date  is: > 3 days              Patient currently is not medically stable to d/c.   Difficult to place patient No       Level of care: Progressive Cardiac Consultants:   Vascular Surgery.    Procedures: Left leg arteriogram scheduled for today  Antimicrobials:  Augmentin , doxycycline  Subjective: left leg  pain, left ankle pain.    Objective: Vitals:   09/03/20 1931 09/03/20 2330 09/04/20 0330 09/04/20 0420  BP: (!) 149/55 (!) 147/50 (!) 130/53 (!) 118/44  Pulse: 78  65 80  Resp: 16  14 15   Temp: 97.7 F (36.5 C)  97.7 F (36.5 C) 97.7 F (36.5 C)  TempSrc: Axillary  Axillary Axillary  SpO2: 97%  99% 92%  Weight:   60.8 kg   Height:        Intake/Output Summary (Last 24 hours) at 09/04/2020 1043 Last data filed at 09/04/2020 0636 Gross per 24 hour  Intake 3 ml  Output 1200 ml  Net -1197 ml   Filed Weights   09/02/20 0500 09/03/20 0517 09/04/20 0330  Weight: 59.8 kg 62.5 kg 60.8 kg    Examination:  General exam: Appears calm and comfortable  Respiratory system: Clear to auscultation. Respiratory effort normal. On 2lit of [place] oxygen.  Cardiovascular system: S1 & S2 heard, RRR. No JVD,  No pedal edema. Gastrointestinal system: Abdomen is nondistended, soft and nontender.  Normal bowel sounds heard. Central nervous system: Alert and oriented. No focal neurological deficits. Extremities: No PEDAL EDEMA.  Skin: right great toe redness and swelling, no tenderness. Lateral malleolus eschar on the left.  Psychiatry: . Mood & affect appropriate.     Data Reviewed: I have personally reviewed following labs and imaging studies  CBC: Recent Labs  Lab 08/31/20 0234 09/01/20 0255 09/02/20 0554 09/03/20 0302 09/04/20 0145  WBC 9.6 10.8* 11.3* 10.0 9.8  HGB 8.2* 8.1* 8.3* 7.6* 8.5*  HCT 26.3* 24.8* 25.6* 24.4* 27.6*  MCV 77.6* 76.5* 76.6* 79.0* 78.4*  PLT 346 351 374 348 414*    Basic Metabolic Panel: Recent Labs  Lab 08/29/20 0322 08/31/20 0234 09/01/20 0255 09/02/20 0554 09/04/20 0145  NA 137 136  --   --  136  K 4.1 4.0  --   --  4.0  CL 103 104  --   --  101  CO2 24 23  --   --  26  GLUCOSE 116* 145*  --   --  104*  BUN 16 11  --   --  10  CREATININE 0.80 0.61  --  0.76 0.61  CALCIUM 8.6* 8.1*  --   --  9.1  MG  --  1.6* 1.8  --   --     GFR: Estimated  Creatinine Clearance: 53.8 mL/min (by C-G formula based on SCr of 0.61 mg/dL).  Liver Function Tests: Recent Labs  Lab 08/29/20 0322  AST 13*  ALT 14  ALKPHOS 76  BILITOT 0.5  PROT 6.8  ALBUMIN 2.7*    CBG: Recent Labs  Lab 09/03/20 0749 09/03/20 1219 09/03/20 1701 09/03/20 2108 09/04/20 0745  GLUCAP 123* 144* 122* 98 83     Recent Results (from the past 240 hour(s))  SARS Coronavirus 2 by RT PCR (hospital order, performed in [facility] hospital lab) Nasopharyngeal Nasopharyngeal Swab     Status: None   Collection Time: 08/26/20  8:56 PM   Specimen: Nasopharyngeal Swab  Result Value  Ref Range Status   SARS Coronavirus 2 NEGATIVE NEGATIVE Final    Comment: (NOTE) SARS-CoV-2 target nucleic acids are NOT DETECTED.  The SARS-CoV-2 RNA is generally detectable in upper and lower respiratory specimens during the acute phase of infection. The lowest concentration of SARS-CoV-2 viral copies this assay can detect is 250 copies / mL. A negative result does not preclude SARS-CoV-2 infection and should not be used as the sole basis for treatment or other patient management decisions.  A negative result may occur with improper specimen collection / handling, submission of specimen other than nasopharyngeal swab, presence of viral mutation(s) within the areas targeted by this assay, and inadequate number of viral copies (<250 copies / mL). A negative result must be combined with clinical observations, patient history, and epidemiological information.  Fact Sheet for Patients:   StrictlyIdeas.no  Fact Sheet for Healthcare Providers: BankingDealers.co.za  This test is not yet approved or  cleared by the [place] FDA and has been authorized for detection and/or diagnosis of SARS-CoV-2 by FDA under an Emergency Use Authorization (EUA).  This EUA will remain in effect (meaning this test can be used) for the duration of  the COVID-19 declaration under Section 564(b)(1) of the Act, 21 U.S.C. section 360bbb-3(b)(1), unless the authorization is terminated or revoked sooner.  Performed at [facility], [place] [address], [place], [place] [address]   Culture, blood (routine x 2)     Status: None   Collection Time: 08/26/20  9:06 PM   Specimen: BLOOD  Result Value Ref Range Status   Specimen Description   Final    BLOOD RIGHT WRIST Performed at [facility] [address] [place], [place] [address]    Special Requests   Final    BOTTLES DRAWN AEROBIC ONLY Blood Culture results may not be optimal due to an inadequate volume of blood received in culture bottles Performed at [facility] [address] [place], [place] [address]    Culture   Final    NO GROWTH 5 DAYS Performed at [facility], [place] [address], [place], [place] [address]    Report Status 08/31/2020 FINAL  Final  Culture, blood (routine x 2)     Status: None   Collection Time: 08/26/20  9:14 PM   Specimen: BLOOD  Result Value Ref Range Status   Specimen Description   Final    BLOOD RIGHT ANTECUBITAL Performed at [facility] [address], [place], [place] [address]    Special Requests   Final    BOTTLES DRAWN AEROBIC AND ANAEROBIC Blood Culture results may not be optimal due to an excessive volume of blood received in culture bottles Performed at [facility] [address], [place], [place] [address]    Culture   Final    NO GROWTH 5 DAYS Performed at [facility], [place] [address], [place], [place] [address]    Report Status 08/31/2020 FINAL  Final         Radiology Studies: No results found.      Scheduled Meds: . amLODipine  5 mg Oral Daily  . amoxicillin-clavulanate  1 tablet Oral Q12H  . aspirin EC  81 mg Oral Daily  . atorvastatin  80 mg Oral Daily  . clopidogrel  75 mg Oral Q breakfast  . doxycycline   100 mg Oral Q12H  . enoxaparin (LOVENOX) injection  40 mg Subcutaneous Q24H  . escitalopram  10  mg Oral Daily  . feeding supplement  237 mL Oral Daily  . ferrous sulfate  325 mg Oral BID WC  . gabapentin  300 mg Oral TID  . insulin aspart  0-15 Units Subcutaneous TID WC  . insulin aspart  0-5 Units Subcutaneous QHS  . insulin glargine  20 Units Subcutaneous Daily  . linagliptin  5 mg Oral Daily  . sodium chloride flush  3 mL Intravenous Q12H   Continuous Infusions: . sodium chloride    . sodium chloride 50 mL/hr at 09/04/20 1007     LOS: 9 days        Crystal Poisson, MD [facility]   To contact the attending provider between 7A-7P or the covering provider during after hours 7P-7A, please log into the web site [email or web address] and access using universal [place] password for that web site. If you do not have the password, please call the hospital operator.  09/04/2020, 10:43 AM

## 2020-09-04 NOTE — Progress Notes (Signed)
   ASSESSMENT & PLAN:  Crystal Moore is a 76 y.o. female with atherosclerosis of native arteries causing ulceration / tissue loss bilateral lower extremities. RLE revascularized. She is not a candidate for open revascularization. Will plan for LLE angiogram with revascularization.   SUBJECTIVE:  No interval changes.  OBJECTIVE:  BP (!) 118/44 (BP Location: Right Arm)   Pulse 80   Temp 97.7 F (36.5 C) (Axillary)   Resp 15   Ht 5\' 2"  (1.575 m)   Wt 60.8 kg   SpO2 92%   BMI 24.52 kg/m   Intake/Output Summary (Last 24 hours) at 09/04/2020 1034 Last data filed at 09/04/2020 0636 Gross per 24 hour  Intake 3 ml  Output 1200 ml  Net -1197 ml    No changes to skin changes of BLE  CBC Latest Ref Rng & Units 09/04/2020 09/03/2020 09/02/2020  WBC 4.0 - 10.5 K/uL 9.8 10.0 11.3(H)  Hemoglobin 12.0 - 15.0 g/dL 8.5(L) 7.6(L) 8.3(L)  Hematocrit 36.0 - 46.0 % 27.6(L) 24.4(L) 25.6(L)  Platelets 150 - 400 K/uL 414(H) 348 374     CMP Latest Ref Rng & Units 09/04/2020 09/02/2020 08/31/2020  Glucose 70 - 99 mg/dL 104(H) - 145(H)  BUN 8 - 23 mg/dL 10 - 11  Creatinine 0.44 - 1.00 mg/dL 0.61 0.76 0.61  Sodium 135 - 145 mmol/L 136 - 136  Potassium 3.5 - 5.1 mmol/L 4.0 - 4.0  Chloride 98 - 111 mmol/L 101 - 104  CO2 22 - 32 mmol/L 26 - 23  Calcium 8.9 - 10.3 mg/dL 9.1 - 8.1(L)  Total Protein 6.5 - 8.1 g/dL - - -  Total Bilirubin 0.3 - 1.2 mg/dL - - -  Alkaline Phos 38 - 126 U/L - - -  AST 15 - 41 U/L - - -  ALT 0 - 44 U/L - - -    Estimated Creatinine Clearance: 53.8 mL/min (by C-G formula based on SCr of 0.61 mg/dL).  [name], MD [facility] Phone Number: [phone] 09/04/2020 10:34 AM

## 2020-09-04 NOTE — TOC Progression Note (Signed)
Transition of Care [facility]) - Progression Note    Patient Details  Name: Crystal Moore MRN: [id number] Date of Birth: 1945-11-13  Transition of Care [facility]) CM/SW [place], [place] Phone Number: 09/04/2020, 3:01 PM  Clinical Narrative:    Patient presented for cellulitis of the right great toe, right foot. Prior to arrival patient was living at home with her daughter including receiving home health aid services with [facility], Monday through Friday 8-4pm and weekends from 9-12pm. Transition of Care informed daughter to go through patients primary care doctor for a lift chair. Ms. Hu[place]a daughter reported having a cane and wheelchair at the home and that she has had home health services before and patient recommended for PT, RN, and OT services with [facility], message sent to MD for resumption orders. Transitions of Care team to continue to follow for discharge plans.   Expected Discharge Plan: [place] Barriers to Discharge: Continued Medical Work up  Expected Discharge Plan and Services Expected Discharge Plan: [place] In-house Referral: Clinical Social Work Discharge Planning Services: CM Consult Post Acute Care Choice: Home Health,Resumption of Svcs/PTA Provider Living arrangements for the past 2 months: Single Family Home                 DME Agency: NA   HH Arranged: OT,PT,RN,Disease Management [place] Agency: [facility] Date HH Agency Contacted: 09/04/20 Time [place]: 1330 Representative spoke with at [place]: Amy   Social Determinants of Health ([place]) Interventions    Readmission Risk Interventions No flowsheet data found.

## 2020-09-04 NOTE — Progress Notes (Addendum)
Inpatient Diabetes Program Recommendations  AACE/ADA: New Consensus Statement on Inpatient Glycemic Control (2015)  Target Ranges:  Prepandial:   less than 140 mg/dL      Peak postprandial:   less than 180 mg/dL (1-2 hours)      Critically ill patients:  140 - 180 mg/dL   Lab Results  Component Value Date   GLUCAP 83 09/04/2020   HGBA1C 10.1 (H) 08/27/2020    Review of Glycemic Control  Results for DAYLA, GASCA (MRN [id number]) as of 09/04/2020 09:37  Ref. Range 09/03/2020 07:49 09/03/2020 12:19 09/03/2020 17:01 09/03/2020 21:08 09/04/2020 07:45  Glucose-Capillary Latest Ref Range: 70 - 99 mg/dL 123 (H) 144 (H) 122 (H) 98 83    Inpatient Diabetes Program Recommendations:    Please consider decreasing Lantus to 20 units daily.  Will continue to follow while inpatient.  Thank you, Reche Dixon, RN, BSN Diabetes Coordinator Inpatient Diabetes Program [phone] (team pager from 8a-5p)

## 2020-09-04 NOTE — Op Note (Addendum)
DATE OF SERVICE: 09/04/2020  PATIENT:  Crystal Moore  75 y.o. female  PRE-OPERATIVE DIAGNOSIS:  Atherosclerosis of native arteries of bilateral lower extremities  POST-OPERATIVE DIAGNOSIS:  Same  PROCEDURE:   1) US guided L anterior tibial artery access 2) US guided R common femoral artery access 3) Subintimal arterial flossing with antegrade-retrograde intervention (SAFARI) 4) Stenting L superficial femoral artery and L popliteal artery (5x135mm, 5x137mm, 5x53mm, 5x31mm Innova stents), post-dilated with 4x243mm Sterling balloon 5) LLE angiography with third order cannulation (Contrast 44mL)  SURGEON:  Surgeon(s) and Role:    * Cherre Robins, MD - Primary  ASSISTANT: none  ANESTHESIA:   local  EBL: min  BLOOD ADMINISTERED:none  DRAINS: none   LOCAL MEDICATIONS USED:  LIDOCAINE   SPECIMEN:  none  COUNTS: confirmed correct.  TOURNIQUET:  none  PATIENT DISPOSITION:  PACU - hemodynamically stable.   Delay start of Pharmacological VTE agent (>24hrs) due to surgical blood loss or risk of bleeding: no  INDICATION FOR PROCEDURE: Crystal Moore is a 75 y.o. female with critical limb ischemia of bilateral lower extremities. After careful discussion of risks, benefits, and alternatives the patient was offered angiography with intervention. We specifically discussed access site complications. The patient's family understood and wished to proceed.  OPERATIVE FINDINGS:  Unremarkable left anterior tibial and right common femoral artery access Long segment occlusion (LSFA at origin to L below knee popliteal) redemonstrated Successful subintimal arterial flossing with externalization of wire to permit recanalization (details below). Left AT sheath pressure 127/63 at completion of case Perclose failure x 2 in R groin - manual pressure held x 50 minutes with good hemostasis. Check CBC / BMP post procedure.  Continue dual antiplatelet therapy. Continue statin therapy. Monitor R  groin for hematoma or bleeding.   DESCRIPTION OF PROCEDURE: After identification of the patient in the pre-operative holding area, the patient was transferred to the operating room. The patient was positioned supine on the operating room table. Anesthesia was induced. The left leg and groins were prepped and draped in standard fashion. A surgical pause was performed confirming correct patient, procedure, and operative location.  The skin over the distal left anterior tibial artery was anesthetized with subcutaneous injection of 1% lidocaine. Using ultrasound guidance, the left anterior tibial artery was accessed with micropuncture technique. The 62F sheath was upsized to 4/72F Slender sheath.   The right groin was anesthetized with subcutaneous injection of 1% lidocaine. Using ultrasound guidance, the right common femoral artery was accessed with micropuncture technique. Fluoroscopy was used to confirm cannulation over the femoral head. Sheathogram was not performed. The 62F sheath was upsized to 72F.   V 18 wire and 018 quick cross catheter were introduced into the left anterior tibial artery advanced to the occluded below-knee popliteal artery.  I entered the subintimal space using standard technique and advanced the wire and catheter combination until I reached the left common femoral artery.  From the right common femoral artery access, an 035 glidewire advantage was advanced into the distal aorta. Over the wire an omni flush catheter was advanced into the aorta.  An aortogram was performed. The left common iliac artery was selected with the 035 glidewire advantage. The wire was advanced into the left profunda femoris artery.  A 10F x 45 cm sheath was advanced into the left external iliac artery.  The 035 Glidewire advantage and a quick cross catheter were used to develop a subintimal plane into the superficial femoral artery via antegrade approach.  I  advanced the wire and catheter combination to the  below-knee popliteal artery.  I tried to reenter the artery here but could not.  I tried to reenter the artery from the retrograde approach but could not.  I introduced a 3 x 40 mm angioplasty balloon over both wires and placed them at the same level in the mid SFA.  These were inflated to nominal pressure and deflated with the hopes of connecting the 2 dissection planes so that [place] a wire could be snared.  A snare from the right common femoral artery access I deployed this in the mid SFA where the subintimal planes were disrupted.  I withdrew the V 18 wire and threaded it through the snare.  I successfully captured the wire and externalized the wire through the right common femoral artery access.  The patient was heparinized.  Over the wire, we performed stenting of the left popliteal and superficial femoral arteries.  This is done with overlapping 5 x 120 mm, 5 x 100 mm, 5 x 80 mm, 5 x 40 mm Innova stents.  The stents were then postdilated with a 4 x 220 mm Sterling balloon.   Completion angiography revealed:  Resolution of the long segment occlusion.  Intact flow to the PT and peroneal.  Sluggish flow to the anterior tibial artery, which is likely due to the sheath still in place.  Manual pressure was held over the anterior tibial access with good result.. A Perclose device was used to close the right common femoral arteriotomy.  2 devices failed.  Manual pressure was held for 50 minutes.  Hemostasis was excellent upon completion.  Upon completion of the case instrument and sharps counts were confirmed correct. The patient was transferred to the PACU in good condition. I was present for all portions of the procedure.  [name], MD [facility] Phone Number: [phone] 09/04/2020 1:36 PM

## 2020-09-05 LAB — CBC
HCT: 25.4 % — ABNORMAL LOW (ref 36.0–46.0)
Hemoglobin: 7.9 g/dL — ABNORMAL LOW (ref 12.0–15.0)
MCH: 24.1 pg — ABNORMAL LOW (ref 26.0–34.0)
MCHC: 31.1 g/dL (ref 30.0–36.0)
MCV: 77.4 fL — ABNORMAL LOW (ref 80.0–100.0)
Platelets: 418 10*3/uL — ABNORMAL HIGH (ref 150–400)
RBC: 3.28 MIL/uL — ABNORMAL LOW (ref 3.87–5.11)
RDW: 16.3 % — ABNORMAL HIGH (ref 11.5–15.5)
WBC: 13.1 10*3/uL — ABNORMAL HIGH (ref 4.0–10.5)
nRBC: 0 % (ref 0.0–0.2)

## 2020-09-05 LAB — GLUCOSE, CAPILLARY
Glucose-Capillary: 89 mg/dL (ref 70–99)
Glucose-Capillary: 192 mg/dL — ABNORMAL HIGH (ref 70–99)
Glucose-Capillary: 206 mg/dL — ABNORMAL HIGH (ref 70–99)
Glucose-Capillary: 207 mg/dL — ABNORMAL HIGH (ref 70–99)

## 2020-09-05 NOTE — Progress Notes (Signed)
PT Note   Patient suffers from weakness and poor balance which impairs their ability to perform daily activities like ambulatiing in the home.  A walker alone will not resolve the issues with performing activities of daily living. A wheelchair will allow patient to safely perform daily activities.  The patient can self propel in the home or has a caregiver who can provide assistance.     [place] Pager [phone] Office [phone]

## 2020-09-05 NOTE — Plan of Care (Signed)
?  Problem: Clinical Measurements: ?Goal: Ability to maintain clinical measurements within normal limits will improve ?Outcome: Progressing ?Goal: Will remain free from infection ?Outcome: Progressing ?Goal: Diagnostic test results will improve ?Outcome: Progressing ?  ?

## 2020-09-05 NOTE — Progress Notes (Signed)
PROGRESS NOTE    Crystal Moore  ZDG:[id number] DOB: 03-Dec-1945 DOA: 08/26/2020 PCP: [name], MD    No chief complaint on file.   Brief Narrative:  75 year old lady prior history of type 2 diabetes mellitus, peripheral vascular disease, unknown healing ulcer on the left lateral ankle, history of CVA, essential hypertension, dementia, hyperlipidemia, gout, obstructive sleep apnea on 2 L of nasal cannula oxygen at home presents to ED with redness of the right great toe and second toe with swelling. ABIs done as an outpatient show severe peripheral vascular disease in both lower extremities. On arrival to ED patient was febrile, tachycardic with a heart rate of 114/min tachypneic 23/min, leukocytosis 15.1. Patient was admitted to the hospitalist service for further evaluation of peripheral vascular disease and cellulitis. She underwent SFA popliteal stenting by vascular surgery on 08/30/20 and is scheduled for left leg arteriogram on 09/04/20. She underwent stenting left superficial femoral artery and left popliteal artery,. Pt is not a candidate for open revascularization.  Vascular surgery recommends outpatient follow up in 4 weeks with ABI and wound check.     Assessment & Plan:   Principal Problem:   Cellulitis of great toe of right foot Active Problems:   COPD (chronic obstructive pulmonary disease) (HCC)   ILD (interstitial lung disease) (HCC)   Sepsis (HCC)   Hypertension   Type 2 diabetes mellitus with vascular disease ([place])   Ankle ulcer ([place])   Sepsis from foot cellulitis:  Patient met sepsis criteria on admission with leukocytosis, tachycardia,  tachypnea and fever. X-rays of the right foot shows no osteomyelitis. Cellulitis improving. Patient was started on broad-spectrum IV antibiotics and switched to Augmentin and oral doxycycline on 08/30/20. Pain control and physical therapy evaluation. Therapy eval recommending Home health PT.     Severe bilateral  peripheral vascular disease with ischemia Dry gangrene in the right great and second toe Left lateral ankle ulcer nonhealing S/p right SFA popliteal stenting by vascular surgery on 08/30/20 She underwent left leg arteriogram on 2/9/2 along with stenting left superficial femoral artery and left popliteal artery,. Pt is not a candidate for open revascularization.  Vascular surgery recommends outpatient follow up in 4 weeks with ABI and wound check.  Meanwhile continue with aspirin statin and Plavix. Therapy evaluation recommending home health PT    Type 2 diabetes mellitus uncontrolled with hyperglycemia Last A1c is around 10.1% CBG (last 3)  Recent Labs    09/04/20 2201 09/05/20 0759 09/05/20 1150  GLUCAP 122* 89 192*   Continue with lantus and SSI.    Diabetic neuropathy Continue with gabapentin.   Anemia of chronic disease/microcytic anemia Iron studies shows iron deficiency. Started the patient on ferrous sulfate twice daily.     Essential hypertension Blood pressure parameters appear to be optimal.   COPD/ILD Patient son reports that she is on 2 L of nasal cannula oxygen at home which she intermittently uses. Continue 2 L of nasal cannula oxygen. No wheezing heard on exam today.    Mild dementia No behavioral abnormalities.   Hyperlipidemia Continue with statin.   History of anxiety and depression Continue with Lexapro.       DVT prophylaxis: (HEPARIN) Code Status: (Full code) Family Communication: Daughter at bedside.  Disposition:   Status is: Inpatient  Remains inpatient appropriate because:Ongoing diagnostic testing needed not appropriate for outpatient work up, IV treatments appropriate due to intensity of illness or inability to take PO and Inpatient level of care appropriate due to severity of illness  Dispo: The patient is from: Home              Anticipated d/c is to: pending              Anticipated d/c date is: > 3 days               Patient currently is not medically stable to d/c.   Difficult to place patient No       Level of care: Progressive Cardiac Consultants:   Vascular Surgery.    Procedures: Left leg arteriogram scheduled for today  Antimicrobials:  Augmentin , doxycycline  Subjective: left leg pain, left ankle pain.    Objective: Vitals:   09/04/20 0330 09/05/20 0623 09/05/20 0824 09/05/20 1301  BP:  (!) 129/53 (!) 124/45 (!) 107/56  Pulse:  77 73 81  Resp:  _0 Temp:  99.4 F (37.4 C) 98.8 F (37.1 C) 99.2 F (37.3 C)  TempSrc:  Oral Oral Oral  SpO2:  94% 92% 95%  Weight: 60.8 kg 60.1 kg    Height:        Intake/Output Summary (Last 24 hours) at 09/05/2020 1508 Last data filed at 09/05/2020 1300 Gross per 24 hour  Intake 1403.96 ml  Output 300 ml  Net 1103.96 ml   Filed Weights   09/03/20 0517 09/04/20 0330 09/05/20 0623  Weight: 62.5 kg 60.8 kg 60.1 kg    Examination:  General exam: alert and comfortable,  Respiratory system: clear to auscultation, no wheezing heard.  Cardiovascular system: S1S2, RRR, no JVD, no pedal edema.  Gastrointestinal system: Abdomen is soft , NT ND , BS+ Central nervous system: alert , and able to move all extremities.  Extremities: no pedal edema.  Skin: right great toe redness and swelling, no tenderness. Lateral malleolus eschar on the left.  Psychiatry: Mood is appropriate.     Data Reviewed: I have personally reviewed following labs and imaging studies  CBC: Recent Labs  Lab 09/01/20 0255 09/02/20 0554 09/03/20 0302 09/04/20 0145 09/05/20 0937  WBC 10.8* 11.3* 10.0 9.8 13.1*  HGB 8.1* 8.3* 7.6* 8.5* 7.9*  HCT 24.8* 25.6* 24.4* 27.6* 25.4*  MCV 76.5* 76.6* 79.0* 78.4* 77.4*  PLT 351 374 348 414* 418*    Basic Metabolic Panel: Recent Labs  Lab 08/31/20 0234 09/01/20 0255 09/02/20 0554 09/04/20 0145  NA 136  --   --  136  K 4.0  --   --  4.0  CL 104  --   --  101  CO2 23  --   --  26  GLUCOSE 145*  --   --   104*  BUN 11  --   --  10  CREATININE 0.61  --  0.76 0.61  CALCIUM 8.1*  --   --  9.1  MG 1.6* 1.8  --   --     GFR: Estimated Creatinine Clearance: 49.5 mL/min (by C-G formula based on SCr of 0.61 mg/dL).  Liver Function Tests: No results for input(s): AST, ALT, ALKPHOS, BILITOT, PROT, ALBUMIN in the last 168 hours.  CBG: Recent Labs  Lab 09/04/20 0745 09/04/20 1652 09/04/20 2201 09/05/20 0759 09/05/20 1150  GLUCAP 83 236* 122* 89 192*     Recent Results (from the past 240 hour(s))  SARS Coronavirus 2 by RT PCR (hospital order, performed in [facility] hospital lab) Nasopharyngeal Nasopharyngeal Swab     Status: None   Collection Time: 08/26/20  8:56 PM   Specimen: Nasopharyngeal  Swab  Result Value Ref Range Status   SARS Coronavirus 2 NEGATIVE NEGATIVE Final    Comment: (NOTE) SARS-CoV-2 target nucleic acids are NOT DETECTED.  The SARS-CoV-2 RNA is generally detectable in upper and lower respiratory specimens during the acute phase of infection. The lowest concentration of SARS-CoV-2 viral copies this assay can detect is 250 copies / mL. A negative result does not preclude SARS-CoV-2 infection and should not be used as the sole basis for treatment or other patient management decisions.  A negative result may occur with improper specimen collection / handling, submission of specimen other than nasopharyngeal swab, presence of viral mutation(s) within the areas targeted by this assay, and inadequate number of viral copies (<250 copies / mL). A negative result must be combined with clinical observations, patient history, and epidemiological information.  Fact Sheet for Patients:   StrictlyIdeas.no  Fact Sheet for Healthcare Providers: BankingDealers.co.za  This test is not yet approved or  cleared by the [place] FDA and has been authorized for detection and/or diagnosis of SARS-CoV-2 by FDA under an Emergency Use  Authorization (EUA).  This EUA will remain in effect (meaning this test can be used) for the duration of the COVID-19 declaration under Section 564(b)(1) of the Act, 21 U.S.C. section 360bbb-3(b)(1), unless the authorization is terminated or revoked sooner.  Performed at [facility], [place] [address], [place], [place] [address]   Culture, blood (routine x 2)     Status: None   Collection Time: 08/26/20  9:06 PM   Specimen: BLOOD  Result Value Ref Range Status   Specimen Description   Final    BLOOD RIGHT WRIST Performed at [facility] [address], [place], [place] [address]    Special Requests   Final    BOTTLES DRAWN AEROBIC ONLY Blood Culture results may not be optimal due to an inadequate volume of blood received in culture bottles Performed at [facility] [address] [place], [place] [address]    Culture   Final    NO GROWTH 5 DAYS Performed at [facility], [place] [address] [place], [place] [address]    Report Status 08/31/2020 FINAL  Final  Culture, blood (routine x 2)     Status: None   Collection Time: 08/26/20  9:14 PM   Specimen: BLOOD  Result Value Ref Range Status   Specimen Description   Final    BLOOD RIGHT ANTECUBITAL Performed at [facility] [address], [place], [place] [address]    Special Requests   Final    BOTTLES DRAWN AEROBIC AND ANAEROBIC Blood Culture results may not be optimal due to an excessive volume of blood received in culture bottles Performed at [facility] [address] [place], [place] [address]    Culture   Final    NO GROWTH 5 DAYS Performed at [facility], [place] [address], [place], [place] [address]    Report Status 08/31/2020 FINAL  Final         Radiology Studies: PERIPHERAL VASCULAR CATHETERIZATION  Result Date: 09/04/2020 DATE OF SERVICE: 09/04/2020  PATIENT:  Santa Lighter  75 y.o. female   PRE-OPERATIVE DIAGNOSIS:  Atherosclerosis of native arteries of bilateral lower extremities  POST-OPERATIVE DIAGNOSIS:  Same  PROCEDURE:  1) US guided L anterior tibial artery access 2) US guided R common femoral artery access 3) Subintimal arterial flossing with antegrade-retrograde intervention (SAFARI) 4) Stenting L superficial femoral  artery and L popliteal artery (5x159m, 5x1042m 5x8061m5x40m23mnova stents), post-dilated with 4x220mm31mrling balloon 5) LLE angiography with third order cannulation (Contrast 65mL)30mRGEON:  Surgeon(s) and Role:    * HawkenCherre Robins Primary  ASSISTANT: none  ANESTHESIA:   local  EBL: min  BLOOD ADMINISTERED:none  DRAINS: none  LOCAL MEDICATIONS USED:  LIDOCAINE  SPECIMEN:  none  COUNTS: confirmed correct.  TOURNIQUET:  none  PATIENT DISPOSITION:  PACU - hemodynamically stable.  Delay start of Pharmacological VTE agent (>24hrs) due to surgical blood loss or risk of bleeding: no  INDICATION FOR PROCEDURE: LizzieMALIAH PYLES74 y.o108female with critical limb ischemia of bilateral lower extremities. After careful discussion of risks, benefits, and alternatives the patient was offered angiography with intervention. We specifically discussed access site complications. The patient's family understood and wished to proceed.  OPERATIVE FINDINGS: Unremarkable left anterior tibial and right common femoral artery access Long segment occlusion (LSFA at origin to L below knee popliteal) redemonstrated Successful subintimal arterial flossing with externalization of wire to permit recanalization (details below). Left AT sheath pressure 127/63 at completion of case Perclose failure x 2 in R groin - manual pressure held x 50 minutes with good hemostasis. Check CBC / BMP post procedure.  Continue dual antiplatelet therapy. Continue statin therapy. Monitor R groin for hematoma or bleeding.  DESCRIPTION OF PROCEDURE: After identification of the patient in the  pre-operative holding area, the patient was transferred to the operating room. The patient was positioned supine on the operating room table. Anesthesia was induced. The left leg and groins were prepped and draped in standard fashion. A surgical pause was performed confirming correct patient, procedure, and operative location.  The skin over the distal left anterior tibial artery was anesthetized with subcutaneous injection of 1% lidocaine. Using ultrasound guidance, the left anterior tibial artery was accessed with micropuncture technique. The 77F sheath was upsized to 4/37F Slender sheath.  The right groin was anesthetized with subcutaneous injection of 1% lidocaine. Using ultrasound guidance, the right common femoral artery was accessed with micropuncture technique. Fluoroscopy was used to confirm cannulation over the femoral head. Sheathogram was not performed. The 77F sheath was upsized to 37F.  V 18 wire and 018 quick cross catheter were introduced into the left anterior tibial artery advanced to the occluded below-knee popliteal artery.  I entered the subintimal space using standard technique and advanced the wire and catheter combination until I reached the left common femoral artery.  From the right common femoral artery access, an 035 glidewire advantage was advanced into the distal aorta. Over the wire an omni flush catheter was advanced into the aorta.  An aortogram was performed. The left common iliac artery was selected with the 035 glidewire advantage. The wire was advanced into the left profunda femoris artery.  A 88F x 45 cm sheath was advanced into the left external iliac artery.  The 035 Glidewire advantage and a quick cross catheter were used to develop a subintimal plane into the superficial femoral artery via antegrade approach.  I advanced the wire and catheter combination to the below-knee popliteal artery.  I tried to reenter the artery here but could not.  I tried to reenter the artery from  the retrograde approach but could not.  I introduced a 3 x 40 mm angioplasty balloon over both wires and placed them at the same level in the mid SFA.  These were inflated to nominal pressure and deflated with  the hopes of connecting the 2 dissection planes so that [place] a wire could be snared.  A snare from the right common femoral artery access I deployed this in the mid SFA where the subintimal planes were disrupted.  I withdrew the V 18 wire and threaded it through the snare.  I successfully captured the wire and externalized the wire through the right common femoral artery access.  The patient was heparinized.  Over the wire, we performed stenting of the left popliteal and superficial femoral arteries.  This is done with overlapping 5 x 120 mm, 5 x 100 mm, 5 x 80 mm, 5 x 40 mm Innova stents.  The stents were then postdilated with a 4 x 220 mm Sterling balloon.  Completion angiography revealed: Resolution of the long segment occlusion.  Intact flow to the PT and peroneal.  Sluggish flow to the anterior tibial artery, which is likely due to the sheath still in place.  Manual pressure was held over the anterior tibial access with good result.. A Perclose device was used to close the right common femoral arteriotomy.  2 devices failed.  Manual pressure was held for 50 minutes.  Hemostasis was excellent upon completion.  Upon completion of the case instrument and sharps counts were confirmed correct. The patient was transferred to the PACU in good condition. I was present for all portions of the procedure.  [name], MD [facility] Phone Number: [phone] 09/04/2020 1:36 PM       Scheduled Meds: . amLODipine  5 mg Oral Daily  . amoxicillin-clavulanate  1 tablet Oral Q12H  . aspirin EC  81 mg Oral Daily  . atorvastatin  80 mg Oral Daily  . clopidogrel  75 mg Oral Q breakfast  . doxycycline  100 mg Oral Q12H  . escitalopram  10 mg Oral Daily  . feeding  supplement  237 mL Oral Daily  . ferrous sulfate  325 mg Oral BID WC  . gabapentin  300 mg Oral TID  . heparin  5,000 Units Subcutaneous Q8H  . insulin aspart  0-15 Units Subcutaneous TID WC  . insulin aspart  0-5 Units Subcutaneous QHS  . insulin glargine  20 Units Subcutaneous Daily  . linagliptin  5 mg Oral Daily  . sodium chloride flush  3 mL Intravenous Q12H   Continuous Infusions: . sodium chloride       LOS: 10 days        Hosie Poisson, MD [facility]   To contact the attending provider between 7A-7P or the covering provider during after hours 7P-7A, please log into the web site [email or web address] and access using universal [place] password for that web site. If you do not have the password, please call the hospital operator.  09/05/2020, 3:08 PM

## 2020-09-05 NOTE — Progress Notes (Addendum)
Physical Therapy Treatment Patient Details Name: Crystal Moore MRN: [id number] DOB: [birth year] Today's Date: 09/05/2020    History of Present Illness Crystal Moore is a 75 year old female with past medical history significant for type 2 diabetes mellitus, peripheral vascular disease with vascular ulcer of left lateral ankle, history of CVA, essential hypertension, dementia, HLD, gout, OSA who presents to ED with right foot redness and darkening of the right great/second toe.  Bilateral lower extremity ischemia with bilateral foot ulceration.  Pt underwent RLE angiogram with R SFA / popliteal stenting on 2/4. Pt for LLE arteriogram and stenting on 2/9.    PT Comments    Pt making slow, steady progress. Pt's daughter present. Lots of family support at home. Recommend w/c and 3-in-1 for home.    Follow Up Recommendations  Home health PT;Supervision for mobility/OOB (Will need assist with all mobility)     Equipment Recommendations  Wheelchair (measurements PT);Wheelchair cushion (measurements PT);3in1 (PT)    Recommendations for Other Services       Precautions / Restrictions Precautions Precautions: Fall    Mobility  Bed Mobility Overal bed mobility: Needs Assistance Bed Mobility: Supine to Sit     Supine to sit: Mod assist     General bed mobility comments: Assist to elevate trunk into sitting and bring hips to EOB    Transfers Overall transfer level: Needs assistance Equipment used: 2 person hand held assist Transfers: Sit to/from [employer] Transfers Sit to Stand: Mod assist;+2 physical assistance Stand pivot transfers: Mod assist;+2 physical assistance       General transfer comment: Assist to bring hips up and for balance. Pt with posterior lean. BUE support for bed to chair. From chair stood x 4 with pt holding onto back of chair for support. Stood from 10-20 seconds  Ambulation/Gait                 Environmental education officer Rankin (Stroke Patients Only)       Balance Overall balance assessment: Needs assistance Sitting-balance support: Single extremity supported Sitting balance-Leahy Scale: Poor Sitting balance - Comments: RU support   Standing balance support: Single extremity supported Standing balance-Leahy Scale: Poor Standing balance comment: mod assist of 1 or 2 people                            Cognition Arousal/Alertness: Awake/alert Behavior During Therapy: WFL for tasks assessed/performed Overall Cognitive Status: Difficult to assess                                 General Comments: Pt follows 1 step commands with visual/tactile cues and incr time. Speaking loudly into ear helpful      Exercises      General Comments General comments (skin integrity, edema, etc.): Daughter present for treatment      Pertinent Vitals/Pain Pain Assessment: Faces Faces Pain Scale: No hurt    Home Living                      Prior Function            PT Goals (current goals can now be found in the care plan section) Progress towards PT goals: Progressing toward goals    Frequency    Min 3X/week  PT Plan Current plan remains appropriate    Co-evaluation              AM-PAC PT "6 Clicks" Mobility   Outcome Measure  Help needed turning from your back to your side while in a flat bed without using bedrails?: A Lot Help needed moving from lying on your back to sitting on the side of a flat bed without using bedrails?: A Lot Help needed moving to and from a bed to a chair (including a wheelchair)?: A Lot Help needed standing up from a chair using your arms (e.g., wheelchair or bedside chair)?: A Lot Help needed to walk in hospital room?: Total Help needed climbing 3-5 steps with a railing? : Total 6 Click Score: 10    End of Session Equipment Utilized During Treatment: Gait belt Activity Tolerance: Patient tolerated  treatment well Patient left: with call bell/phone within reach;in chair;with chair alarm set;with family/visitor present Nurse Communication: Mobility status;Need for lift equipment PT Visit Diagnosis: Other abnormalities of gait and mobility (R26.89);Muscle weakness (generalized) (M62.81)     Time: 3202-3343 PT Time Calculation (min) (ACUTE ONLY): 22 min  Charges:  $Therapeutic Activity: 8-22 mins                     Clare Pager [phone] Office Power 09/05/2020, 11:10 AM

## 2020-09-05 NOTE — Progress Notes (Signed)
Inpatient Diabetes Program Recommendations  AACE/ADA: New Consensus Statement on Inpatient Glycemic Control (2015)  Target Ranges:  Prepandial:   less than 140 mg/dL      Peak postprandial:   less than 180 mg/dL (1-2 hours)      Critically ill patients:  140 - 180 mg/dL   Lab Results  Component Value Date   GLUCAP 192 (H) 09/05/2020   HGBA1C 10.1 (H) 08/27/2020    Review of Glycemic Control Results for ALFRED, ECKLEY (MRN [id number]) as of 09/05/2020 14:26  Ref. Range 09/04/2020 07:45 09/04/2020 16:52 09/04/2020 22:01 09/05/2020 07:59 09/05/2020 11:50  Glucose-Capillary Latest Ref Range: 70 - 99 mg/dL 83 236 (H) 122 (H) 89 192 (H)    Inpatient Diabetes Program Recommendations:    Lantus 16 units QAM   Thank you, Reche Dixon, RN, BSN Diabetes Coordinator Inpatient Diabetes Program [phone] (team pager from 8a-5p)

## 2020-09-05 NOTE — Progress Notes (Signed)
[facility]  Subjective  - She has had a prior major stroke and has dementia.  She is not able to answer questions.   Objective (!) 129/53 77 99.4 F (37.4 C) (Oral) 19 94%  Intake/Output Summary (Last 24 hours) at 09/05/2020 0706 Last data filed at 09/05/2020 0301 Gross per 24 hour  Intake 920.96 ml  Output -  Net 920.96 ml       Wounds appear stable with good doppler signals right peroneal/DP, left brisk DP B groins soft without hematoma.  Assessment/Planning: POD # 1 Stenting L superficial femoral artery and L popliteal artery (5x116mm, 5x113mm, 5x63mm, 5x1mm Innova stents), post-dilated with 4x247mm Sterling balloon 08/30/20 R SFA / popliteal stenting (6x123mm Eluvia x 3)  for tissue loss on both lower extremities.  She has a complex past history. She is not a candidate for open revascularization.  Inflow seems to be improved and wounds now have a better chance to heal. F/U with Dr. Mora Appl office in 4 weeks with ABI and wound check.  Roxy Horseman 09/05/2020 7:06 AM --  Laboratory Lab Results: Recent Labs    09/03/20 0302 09/04/20 0145  WBC 10.0 9.8  HGB 7.6* 8.5*  HCT 24.4* 27.6*  PLT 348 414*   BMET Recent Labs    09/04/20 0145  NA 136  K 4.0  CL 101  CO2 26  GLUCOSE 104*  BUN 10  CREATININE 0.61  CALCIUM 9.1    COAG Lab Results  Component Value Date   INR 1.0 08/09/2020   INR 1.1 01/13/2019   INR 1.22 05/28/2018   No results found for: PTT

## 2020-09-05 NOTE — Progress Notes (Signed)
Physical Therapy Treatment Patient Details Name: Crystal Moore MRN: [id number] DOB: 17-Feb-1946 Today's Date: 09/05/2020    History of Present Illness Crystal Moore is a 75 year old female with past medical history significant for type 2 diabetes mellitus, peripheral vascular disease with vascular ulcer of left lateral ankle, history of CVA, essential hypertension, dementia, HLD, gout, OSA who presents to ED with right foot redness and darkening of the right great/second toe.  Bilateral lower extremity ischemia with bilateral foot ulceration.  Pt underwent RLE angiogram with R SFA / popliteal stenting on 2/4. Pt for LLE arteriogram and stenting on 2/9.    PT Comments    Pt requires assist for all mobility. Supportive family and aide at home. Recommend HHPT.    Follow Up Recommendations  Home health PT;Supervision for mobility/OOB (Will need assist with all mobility)     Equipment Recommendations  Wheelchair (measurements PT);Wheelchair cushion (measurements PT);3in1 (PT)    Recommendations for Other Services       Precautions / Restrictions Precautions Precautions: Fall    Mobility  Bed Mobility Overal bed mobility: Needs Assistance Bed Mobility: Sit to Supine       Sit to supine: Mod assist   General bed mobility comments: Assist to bring legs back into the bed    Transfers Overall transfer level: Needs assistance Equipment used: Ambulation equipment used Transfers: Sit to/from [employer] Transfers Sit to Stand: Mod assist;+2 physical assistance         General transfer comment: Assist to bring hips up and for balance using Stedy. Pt with hips remaining flexed. Used Stedy for chair to bed.  Ambulation/Gait                 Stairs             Wheelchair Mobility    Modified Rankin (Stroke Patients Only)       Balance Overall balance assessment: Needs assistance Sitting-balance support: Single extremity supported Sitting  balance-Leahy Scale: Poor Sitting balance - Comments: RUE support   Standing balance support: Single extremity supported Standing balance-Leahy Scale: Poor Standing balance comment: +2 mod due to flexed hips                            Cognition Arousal/Alertness: Awake/alert Behavior During Therapy: WFL for tasks assessed/performed Overall Cognitive Status: Difficult to assess                                 General Comments: Pt follows 1 step commands with visual/tactile cues and incr time. Speaking loudly into ear helpful      Exercises      General Comments        Pertinent Vitals/Pain Pain Assessment: Faces Faces Pain Scale: No hurt    Home Living                      Prior Function            PT Goals (current goals can now be found in the care plan section) Progress towards PT goals: Progressing toward goals    Frequency    Min 3X/week      PT Plan Current plan remains appropriate    Co-evaluation              AM-PAC PT "6 Clicks" Mobility   Outcome Measure  Help needed turning  from your back to your side while in a flat bed without using bedrails?: A Lot Help needed moving from lying on your back to sitting on the side of a flat bed without using bedrails?: A Lot Help needed moving to and from a bed to a chair (including a wheelchair)?: A Lot Help needed standing up from a chair using your arms (e.g., wheelchair or bedside chair)?: A Lot Help needed to walk in hospital room?: Total Help needed climbing 3-5 steps with a railing? : Total 6 Click Score: 10    End of Session   Activity Tolerance: Patient limited by fatigue Patient left: with call bell/phone within reach;in bed;with nursing/sitter in room Nurse Communication: Mobility status;Need for lift equipment (Nurse tech assisted with mobility) PT Visit Diagnosis: Other abnormalities of gait and mobility (R26.89);Muscle weakness (generalized) (M62.81)      Time: 5797-2820 PT Time Calculation (min) (ACUTE ONLY): 10 min  Charges:  $Therapeutic Activity: 8-22 mins                     [place] Pager [phone] Office [place] 09/05/2020, 4:31 PM

## 2020-09-06 DIAGNOSIS — I10 Essential (primary) hypertension: Secondary | ICD-10-CM

## 2020-09-06 DIAGNOSIS — J449 Chronic obstructive pulmonary disease, unspecified: Secondary | ICD-10-CM

## 2020-09-06 DIAGNOSIS — L03115 Cellulitis of right lower limb: Secondary | ICD-10-CM

## 2020-09-06 DIAGNOSIS — I739 Peripheral vascular disease, unspecified: Secondary | ICD-10-CM

## 2020-09-06 LAB — GLUCOSE, CAPILLARY: Glucose-Capillary: 153 mg/dL — ABNORMAL HIGH (ref 70–99)

## 2020-09-06 MED ORDER — AMOXICILLIN-POT CLAVULANATE 875-125 MG PO TABS: 1.0000 | ORAL_TABLET | Freq: Two times a day (BID) | ORAL | 0 refills | Status: AC

## 2020-09-06 MED ORDER — CLOPIDOGREL BISULFATE 75 MG PO TABS: 75.0000 mg | ORAL_TABLET | Freq: Every day | ORAL | 2 refills | Status: DC

## 2020-09-06 MED ORDER — ACETAMINOPHEN 325 MG PO TABS: 650.0000 mg | ORAL_TABLET | ORAL | Status: DC | PRN

## 2020-09-06 MED ORDER — GABAPENTIN 300 MG PO CAPS: 300.0000 mg | ORAL_CAPSULE | Freq: Three times a day (TID) | ORAL | 3 refills | Status: AC

## 2020-09-06 MED ORDER — TRAMADOL HCL 50 MG PO TABS: 25.0000 mg | ORAL_TABLET | Freq: Two times a day (BID) | ORAL | 0 refills | Status: AC | PRN

## 2020-09-06 MED ORDER — FERROUS SULFATE 325 (65 FE) MG PO TABS: 325.0000 mg | ORAL_TABLET | Freq: Two times a day (BID) | ORAL | 3 refills | Status: DC

## 2020-09-06 MED ORDER — INSULIN GLARGINE 100 UNIT/ML ~~LOC~~ SOLN: 20.0000 [IU] | Freq: Every day | SUBCUTANEOUS | 11 refills | Status: DC

## 2020-09-06 MED ORDER — DOXYCYCLINE MONOHYDRATE 100 MG PO TABS: 100.0000 mg | ORAL_TABLET | Freq: Two times a day (BID) | ORAL | 0 refills | Status: AC

## 2020-09-06 NOTE — Plan of Care (Signed)
?  Problem: Clinical Measurements: ?Goal: Ability to maintain clinical measurements within normal limits will improve ?Outcome: Progressing ?Goal: Will remain free from infection ?Outcome: Progressing ?Goal: Diagnostic test results will improve ?Outcome: Progressing ?  ?

## 2020-09-06 NOTE — Discharge Instructions (Signed)
Cellulitis, Adult  Cellulitis is a skin infection. The infected area is often warm, red, swollen, and sore. It occurs most often in the arms and lower legs. It is very important to get treated for this condition. What are the causes? This condition is caused by bacteria. The bacteria enter through a break in the skin, such as a cut, burn, insect bite, open sore, or crack. What increases the risk? This condition is more likely to occur in people who:  Have a weak body defense system (immune system).  Have open cuts, burns, bites, or scrapes on the skin.  Are older than 74 years of age.  Have a blood sugar problem (diabetes).  Have a long-lasting (chronic) liver disease (cirrhosis) or kidney disease.  Are very overweight (obese).  Have a skin problem, such as: ? Itchy rash (eczema). ? Slow movement of blood in the veins (venous stasis). ? Fluid buildup below the skin (edema).  Have been treated with high-energy rays (radiation).  Use IV drugs. What are the signs or symptoms? Symptoms of this condition include:  Skin that is: ? Red. ? Streaking. ? Spotting. ? Swollen. ? Sore or painful when you touch it. ? Warm.  A fever.  Chills.  Blisters. How is this diagnosed? This condition is diagnosed based on:  Medical history.  Physical exam.  Blood tests.  Imaging tests. How is this treated? Treatment for this condition may include:  Medicines to treat infections or allergies.  Home care, such as: ? Rest. ? Placing cold or warm cloths (compresses) on the skin.  Hospital care, if the condition is very bad. Follow these instructions at home: Medicines  Take over-the-counter and prescription medicines only as told by your doctor.  If you were prescribed an antibiotic medicine, take it as told by your doctor. Do not stop taking it even if you start to feel better. General instructions  Drink enough fluid to keep your pee (urine) pale yellow.  Do not  touch or rub the infected area.  Raise (elevate) the infected area above the level of your heart while you are sitting or lying down.  Place cold or warm cloths on the area as told by your doctor.  Keep all follow-up visits as told by your doctor. This is important.   Contact a doctor if:  You have a fever.  You do not start to get better after 1-2 days of treatment.  Your bone or joint under the infected area starts to hurt after the skin has healed.  Your infection comes back. This can happen in the same area or another area.  You have a swollen bump in the area.  You have new symptoms.  You feel ill and have muscle aches and pains. Get help right away if:  Your symptoms get worse.  You feel very sleepy.  You throw up (vomit) or have watery poop (diarrhea) for a long time.  You see red streaks coming from the area.  Your red area gets larger.  Your red area turns dark in color. These symptoms may represent a serious problem that is an emergency. Do not wait to see if the symptoms will go away. Get medical help right away. Call your local emergency services (911 in the [place] Do not drive yourself to the hospital. Summary  Cellulitis is a skin infection. The area is often warm, red, swollen, and sore.  This condition is treated with medicines, rest, and cold and warm cloths.  Take all  medicines only as told by your doctor.  Tell your doctor if symptoms do not start to get better after 1-2 days of treatment. This information is not intended to replace advice given to you by your health care provider. Make sure you discuss any questions you have with your health care provider. Document Revised: 12/02/2017 Document Reviewed: 12/02/2017 Elsevier Patient Education  [place]. Peripheral Vascular Disease  Peripheral vascular disease (PVD) is a disease of the blood vessels that carry blood from the heart to the rest of the body. PVD is also called peripheral  artery disease (PAD) or poor circulation. PVD affects most of the body. But it affects the legs and feet the most. PVD can lead to acute limb ischemia. This happens when there is a sudden stop of blood flow to an arm or leg. This is a medical emergency. What are the causes? The most common cause of PVD is a buildup of a fatty substance (plaque) inside your arteries. This decreases blood flow. Plaque can break off and block blood in a smaller artery. This can lead to acute limb ischemia. Other common causes of PVD include:  Blood clots inside the blood vessels.  Injuries to blood vessels.  Irritation and swelling of blood vessels.  Sudden tightening of the blood vessel (spasms). What increases the risk?  A family history of PVD.  Medical conditions, including: ? High cholesterol. ? Diabetes. ? High blood pressure. ? Heart disease. ? Past problems with blood clots. ? Past injury, such as burns or a broken bone.  Other conditions, such as: ? Buerger's disease. This is caused by swollen or irritated blood vessels in your hands and feet. ? Arthritis. ? Birth defects that affect the arteries in your legs. ? Kidney disease.  Using tobacco or nicotine products.  Not getting enough exercise.  Being very overweight (obese).  Being 55 years old or older. What are the signs or symptoms?  Cramps in your butt, legs, and feet.  Pain and weakness in your legs when you are active that goes away when you rest.  Leg pain when at rest.  Leg numbness, tingling, or weakness.  Coldness in a leg or foot, especially when compared with the other leg or foot.  Skin or hair changes. These can include: ? Hair loss. ? Shiny skin. ? Pale or bluish skin. ? Thick toenails.  Being unable to get or keep an erection.  Tiredness (fatigue).  Weak pulse or no pulse in the feet.  Wounds and sores on the toes, feet, or legs. These take longer to heal. How is this treated? Underlying causes are  treated first. Other conditions, like diabetes, high cholesterol, and blood pressure, are also treated. Treatment may include:  Lifestyle changes, such as: ? Quitting smoking. ? Getting regular exercise. ? Having a diet low in fat and cholesterol. ? Not drinking alcohol.  Taking medicines, such as: ? Blood thinners. ? Medicines to improve blood flow. ? Medicines to improve your blood cholesterol.  Procedures to: ? Open the arteries and restore blood flow. ? Insert a small mesh tube (stent) to keep a blocked vessel open. ? Create a new path for blood to flow to the body (peripheral bypass). ? Remove dead tissue from a wound. ? Remove an affected leg or arm. Follow these instructions at home: Medicines  Take over-the-counter and prescription medicines only as told by your doctor.  If you are taking blood thinners: ? Talk with your doctor before you take any  medicines that have aspirin, or NSAIDs, such as ibuprofen. ? Take medicines exactly as told. Take them at the same time each day. ? Avoid doing things that could hurt or bruise you. Take action to prevent falls. ? Wear an alert bracelet or carry a card that shows you are taking blood thinners. Lifestyle  Get regular exercise. Ask your doctor about how to stay active.  Talk with your doctor about keeping a healthy weight. If needed, ask about losing weight.  Eat a diet that is low in fat and cholesterol. If you need help, talk with your doctor.  Do not drink alcohol.  Do not smoke or use any products that contain nicotine or tobacco. If you need help quitting, ask your doctor.      General instructions  Take good care of your feet. To do this: ? Wear shoes that fit well and feel good. ? Check your feet often for any cuts or sores.  Get a flu shot (influenza vaccine) each year.  Keep all follow-up visits. Where to find more information  Society for Vascular Surgery: vascular.org  American Heart Association:  heart.org  National Heart, Lung, and Blood Institute: https://www.[place].biz/ Contact a doctor if:  You have cramps in your legs when you walk.  You have leg pain when you rest.  Your leg or foot feels cold.  Your skin changes.  You cannot get or keep an erection.  You have cuts or sores on your legs or feet that do not heal. Get help right away if:  You have sudden changes in the color and feeling of your arms or legs, such as: ? Your arm or leg turns cold, numb, and blue. ? Your arm or leg becomes red, warm, swollen, painful, or numb.  You have any signs of a stroke. "BE FAST" is an easy way to remember the main warning signs: ? B - Balance. Dizziness, sudden trouble walking, or loss of balance. ? E - Eyes. Trouble seeing or a change in how you see. ? F - Face. Sudden weakness or loss of feeling of the face. The face or eyelid may droop on one side. ? A - Arms. Weakness or loss of feeling in an arm. This happens all of a sudden and most often on one side of the body. ? S - Speech. Sudden trouble speaking, slurred speech, or trouble understanding what people say. ? T - Time. Time to call emergency services. Write down what time symptoms started.  You have other signs of a stroke, such as: ? A sudden, very bad headache with no known cause. ? Feeling like you may vomit (nausea). ? Vomiting. ? A seizure.  You have chest pain or trouble breathing. These symptoms may be an emergency. Get help right away. Call your local emergency services (911 in the [place]  Do not wait to see if the symptoms will go away.  Do not drive yourself to the hospital. Summary  Peripheral vascular disease (PVD) is a disease of the blood vessels.  PVD affects the legs and feet the most.  Symptoms may include leg pain or leg numbness, tingling, and weakness.  Treatment may include lifestyle changes, medicines, and procedures. This information is not intended to replace advice given to you by your health  care provider. Make sure you discuss any questions you have with your health care provider. Document Revised: 01/15/2020 Document Reviewed: 01/15/2020 Elsevier Patient Education  [place].

## 2020-09-06 NOTE — TOC Transition Note (Addendum)
Transition of Care [facility]) - CM/SW Discharge Note   Patient Details  Name: Crystal Moore MRN: [id number] Date of Birth: Apr 19, 1946  [facility] CM/SW Contact:  Bethena Roys, RN Phone Number: 09/06/2020, 11:24 AM   Clinical Narrative:  Patient will transition home today via private vehicle. Daughter at the bedside and we discussed durable medica equipment- bedside commode- ordered via [employer] and it will be delivered prior to transition home. Pt has a wheelchair and rolling walker in the home. Daughter had questions regarding a new mattress for the patient's hospital bed. Patient received the bed from [employer]. Case Manager called [employer] and new order placed for replacement mattress and overlay- will deliver to the home. [facility] is aware that the patient will transition home today. No further needs identified at this time.     Final next level of care: [place] Barriers to Discharge: Continued Medical Work up   Patient Goals and CMS Choice Patient states their goals for this hospitalization and ongoing recovery are:: to go home with home health services CMS Medicare.gov Compare Post Acute Care list provided to::  (patients daughter, Fraser Din) Choice offered to / list presented to : NA (active with [facility])  Discharge Plan and Services In-house Referral: Clinical Social Work Discharge Planning Services: CM Consult Post Acute Care Choice: Home Health,Resumption of Svcs/PTA Provider          DME Arranged: Bedside commode (Mattress and overlay ordered to be delivered at the home.) DME Agency: [employer] Date DME Agency Contacted: 09/06/20 Time DME Agency Contacted: 1000 Representative spoke with at DME Agency: Freda Munro HH Arranged: OT,PT,RN,Disease Management [place] Agency: [facility] [place] Date [place]: 09/04/20 Time [place]: 1330 Representative spoke with at [place]: Amy  Readmission Risk Interventions No flowsheet  data found.

## 2020-09-06 NOTE — Discharge Summary (Signed)
Physician Discharge Summary  Crystal Moore EQA:[id number] DOB: February 23, 1946 DOA: 08/26/2020  PCP: [name], MD  Admit date: 08/26/2020 Discharge date: 09/06/2020  Admitted From: Home.  Disposition:  Home with home health services.   Recommendations for Outpatient Follow-up:  1. Follow up with PCP in 1-2 weeks 2. Please obtain BMP/CBC in one week Please follow up with vascular as recommended.   Discharge Condition:stable.  CODE STATUS: full code.  Diet recommendation: Heart Healthy   Brief/Interim Summary: 75 year old lady prior history of type 2 diabetes mellitus, peripheral vascular disease, unknown healing ulcer on the left lateral ankle, history of CVA, essential hypertension, dementia, hyperlipidemia, gout, obstructive sleep apnea on 2 L of nasal cannula oxygen at home presents to ED with redness of the right great toe and second toe with swelling. ABIs done as an outpatient show severe peripheral vascular disease in both lower extremities. On arrival to ED patient was febrile, tachycardic with a heart rate of 114/min tachypneic 23/min, leukocytosis 15.1. Patient was admitted to the hospitalist service for further evaluation of peripheral vascular disease and cellulitis. She underwent SFA popliteal stenting by vascular surgery on 08/30/20 and is scheduled for left leg arteriogram on 09/04/20. She underwent stenting left superficial femoral artery and left popliteal artery,. Pt is not a candidate for open revascularization.  Vascular surgery recommends outpatient follow up in 4 weeks with ABI and wound check.   Discharge Diagnoses:  Principal Problem:   Cellulitis of great toe of right foot Active Problems:   COPD (chronic obstructive pulmonary disease) (HCC)   ILD (interstitial lung disease) (HCC)   Sepsis (HCC)   Hypertension   Type 2 diabetes mellitus with vascular disease (HCC)   Ankle ulcer ([place])    Sepsis from foot cellulitis:  Patient met sepsis criteria on  admission with leukocytosis, tachycardia,  tachypnea and fever. X-rays of the right foot shows no osteomyelitis. Cellulitis improving. Patient was started on broad-spectrum IV antibiotics and switched to Augmentin and oral doxycycline on 08/30/20, to complete 4 more days to complete the course.  Pain control and physical therapy evaluation. Therapy eval recommending Home health PT.     Severe bilateral peripheral vascular disease with ischemia Dry gangrene in the right great and second toe Left lateral ankle ulcer nonhealing S/p right SFA popliteal stenting by vascular surgery on 08/30/20 She underwent left leg arteriogram on 2/9/2 along with stenting left superficial femoral artery and left popliteal artery,. Pt is not a candidate for open revascularization.  Vascular surgery recommends outpatient follow up in 4 weeks with ABI and wound check.  Meanwhile continue with aspirin statin and Plavix. Therapy evaluation recommending home health PT    Type 2 diabetes mellitus uncontrolled with hyperglycemia Last A1c is around 10.1% CBG (last 3)  Recent Labs    09/05/20 1650 09/05/20 2112 09/06/20 0801  GLUCAP 206* 207* 153*   Resume trajenta and lantus.    Diabetic neuropathy Continue with gabapentin.   Anemia of chronic disease/microcytic anemia Iron studies shows iron deficiency. Started the patient on ferrous sulfate twice daily.     Essential hypertension Blood pressure parameters appear to be optimal.   COPD/ILD Patient son reports that she is on 2 L of nasal cannula oxygen at home which she intermittently uses. Continue 2 L of nasal cannula oxygen. No wheezing heard on exam today.    Mild dementia No behavioral abnormalities.   Hyperlipidemia Continue with statin.   History of anxiety and depression Continue with Lexapro.     Discharge  Instructions  Discharge Instructions    Diet - low sodium heart healthy   Complete by: As  directed    Discharge instructions   Complete by: As directed    Please follow up with vascular as recommended.   Discharge wound care:   Complete by: As directed    Daily.     Allergies as of 09/06/2020   No Known Allergies     Medication List    STOP taking these medications   Tresiba 100 UNIT/ML Soln Generic drug: Insulin Degludec     TAKE these medications   Accu-Chek Guide test strip Generic drug: glucose blood 1 each by Other route in the morning and at bedtime. And lancets 2/day   acetaminophen 325 MG tablet Commonly known as: TYLENOL Take 2 tablets (650 mg total) by mouth every 4 (four) hours as needed for headache, mild pain, moderate pain or fever.   albuterol 108 (90 Base) MCG/ACT inhaler Commonly known as: VENTOLIN HFA Inhale 1 puff into the lungs every 6 (six) hours as needed for wheezing or shortness of breath.   albuterol (2.5 MG/3ML) 0.083% nebulizer solution Commonly known as: PROVENTIL Inhale 3 mLs (2.5 mg total) into the lungs every 4 (four) hours as needed for wheezing or shortness of breath.   amLODipine 5 MG tablet Commonly known as: NORVASC Take 5 mg by mouth daily.   amoxicillin-clavulanate 875-125 MG tablet Commonly known as: Augmentin Take 1 tablet by mouth 2 (two) times daily for 4 days. Continue through 2/13   aspirin 81 MG EC tablet Take by mouth daily.   atorvastatin 80 MG tablet Commonly known as: LIPITOR Take 1 tablet (80 mg total) by mouth daily at 6 PM. What changed: when to take this   clopidogrel 75 MG tablet Commonly known as: PLAVIX Take 1 tablet (75 mg total) by mouth daily.   doxycycline 100 MG tablet Commonly known as: ADOXA Take 1 tablet (100 mg total) by mouth 2 (two) times daily for 4 days. Continue through 2/3   escitalopram 10 MG tablet Commonly known as: Lexapro Take 1 tablet (10 mg total) by mouth at bedtime. What changed: when to take this   feeding supplement Liqd Take 237 mLs by mouth 2 (two) times  daily between meals. What changed: when to take this   ferrous sulfate 325 (65 FE) MG tablet Take 1 tablet (325 mg total) by mouth 2 (two) times daily with a meal.   gabapentin 300 MG capsule Commonly known as: NEURONTIN Take 1 capsule (300 mg total) by mouth 3 (three) times daily.   insulin glargine 100 UNIT/ML injection Commonly known as: LANTUS Inject 0.2 mLs (20 Units total) into the skin daily. Start taking on: September 07, 2020   Tradjenta 5 MG Tabs tablet Generic drug: linagliptin Take 5 mg by mouth daily.   traMADol 50 MG tablet Commonly known as: ULTRAM Take 0.5 tablets (25 mg total) by mouth every 12 (twelve) hours as needed for up to 5 days for severe pain.            Discharge Care Instructions  (From admission, onward)         Start     Ordered   09/06/20 0000  Discharge wound care:       Comments: Daily.   09/06/20 1005          Follow-up Information    [facility] Follow up.   Specialty: Home Health Services Why: Registered Nurse, Physical Therapy, Occupational Therapy-  office to call the patient for visit times Contact information: [place] [address] [phone]        Cherre Robins, MD Follow up in 4 week(s).   Specialties: Vascular Surgery, Interventional Cardiology Contact information: [place] [address] [phone]        [name], MD. Schedule an appointment as soon as possible for a visit in 1 week(s).   Specialty: Internal Medicine Contact information: [address] [place] [address] [phone]              No Known Allergies  Consultations:  Vascular surgery.    Procedures/Studies: DG Chest 1 View  Result Date: 08/26/2020 CLINICAL DATA:  Lower extremity pain EXAM: CHEST  1 VIEW COMPARISON:  01/13/2019 FINDINGS: Cardiac shadow is at the upper limits of normal in size. Aortic calcifications are again noted. Calcified granuloma  is noted in the right mid lung. The lungs are well aerated bilaterally. Mild interstitial edema is seen. No bony abnormality is noted. IMPRESSION: Mild interstitial edema. Electronically Signed   By: Inez Catalina M.D.   On: 08/26/2020 21:35   PERIPHERAL VASCULAR CATHETERIZATION  Result Date: 09/04/2020 DATE OF SERVICE: 09/04/2020  PATIENT:  Santa Lighter  75 y.o. female  PRE-OPERATIVE DIAGNOSIS:  Atherosclerosis of native arteries of bilateral lower extremities  POST-OPERATIVE DIAGNOSIS:  Same  PROCEDURE:  1) US guided L anterior tibial artery access 2) US guided R common femoral artery access 3) Subintimal arterial flossing with antegrade-retrograde intervention (SAFARI) 4) Stenting L superficial femoral artery and L popliteal artery (5x144m, 5x1025m 5x8068m5x40m39mnova stents), post-dilated with 4x220mm19mrling balloon 5) LLE angiography with third order cannulation (Contrast 65mL)2mRGEON:  Surgeon(s) and Role:    * HawkenCherre Robins Primary  ASSISTANT: none  ANESTHESIA:   local  EBL: min  BLOOD ADMINISTERED:none  DRAINS: none  LOCAL MEDICATIONS USED:  LIDOCAINE  SPECIMEN:  none  COUNTS: confirmed correct.  TOURNIQUET:  none  PATIENT DISPOSITION:  PACU - hemodynamically stable.  Delay start of Pharmacological VTE agent (>24hrs) due to surgical blood loss or risk of bleeding: no  INDICATION FOR PROCEDURE: LizzieTAYSIA RIVERE74 y.o29female with critical limb ischemia of bilateral lower extremities. After careful discussion of risks, benefits, and alternatives the patient was offered angiography with intervention. We specifically discussed access site complications. The patient's family understood and wished to proceed.  OPERATIVE FINDINGS: Unremarkable left anterior tibial and right common femoral artery access Long segment occlusion (LSFA at origin to L below knee popliteal) redemonstrated Successful subintimal arterial flossing with externalization of wire to permit recanalization  (details below). Left AT sheath pressure 127/63 at completion of case Perclose failure x 2 in R groin - manual pressure held x 50 minutes with good hemostasis. Check CBC / BMP post procedure.  Continue dual antiplatelet therapy. Continue statin therapy. Monitor R groin for hematoma or bleeding.  DESCRIPTION OF PROCEDURE: After identification of the patient in the pre-operative holding area, the patient was transferred to the operating room. The patient was positioned supine on the operating room table. Anesthesia was induced. The left leg and groins were prepped and draped in standard fashion. A surgical pause was performed confirming correct patient, procedure, and operative location.  The skin over the distal left anterior tibial artery was anesthetized with subcutaneous injection of 1% lidocaine. Using ultrasound guidance, the left anterior tibial artery was accessed with micropuncture technique. The 28F sheath  was upsized to 4/38F Slender sheath.  The right groin was anesthetized with subcutaneous injection of 1% lidocaine. Using ultrasound guidance, the right common femoral artery was accessed with micropuncture technique. Fluoroscopy was used to confirm cannulation over the femoral head. Sheathogram was not performed. The 52F sheath was upsized to 38F.  V 18 wire and 018 quick cross catheter were introduced into the left anterior tibial artery advanced to the occluded below-knee popliteal artery.  I entered the subintimal space using standard technique and advanced the wire and catheter combination until I reached the left common femoral artery.  From the right common femoral artery access, an 035 glidewire advantage was advanced into the distal aorta. Over the wire an omni flush catheter was advanced into the aorta.  An aortogram was performed. The left common iliac artery was selected with the 035 glidewire advantage. The wire was advanced into the left profunda femoris artery.  A 31F x 45 cm sheath was  advanced into the left external iliac artery.  The 035 Glidewire advantage and a quick cross catheter were used to develop a subintimal plane into the superficial femoral artery via antegrade approach.  I advanced the wire and catheter combination to the below-knee popliteal artery.  I tried to reenter the artery here but could not.  I tried to reenter the artery from the retrograde approach but could not.  I introduced a 3 x 40 mm angioplasty balloon over both wires and placed them at the same level in the mid SFA.  These were inflated to nominal pressure and deflated with the hopes of connecting the 2 dissection planes so that [place] a wire could be snared.  A snare from the right common femoral artery access I deployed this in the mid SFA where the subintimal planes were disrupted.  I withdrew the V 18 wire and threaded it through the snare.  I successfully captured the wire and externalized the wire through the right common femoral artery access.  The patient was heparinized.  Over the wire, we performed stenting of the left popliteal and superficial femoral arteries.  This is done with overlapping 5 x 120 mm, 5 x 100 mm, 5 x 80 mm, 5 x 40 mm Innova stents.  The stents were then postdilated with a 4 x 220 mm [place] balloon.  Completion angiography revealed: Resolution of the long segment occlusion.  Intact flow to the PT and peroneal.  Sluggish flow to the anterior tibial artery, which is likely due to the sheath still in place.  Manual pressure was held over the anterior tibial access with good result.. A Perclose device was used to close the right common femoral arteriotomy.  2 devices failed.  Manual pressure was held for 50 minutes.  Hemostasis was excellent upon completion.  Upon completion of the case instrument and sharps counts were confirmed correct. The patient was transferred to the PACU in good condition. I was present for all portions of the procedure.  [name], MD [facility]ascular and Vein  [facility] Phone Number: [phone] 09/04/2020 1:36 PM  PERIPHERAL VASCULAR CATHETERIZATION  Result Date: 08/30/2020 DATE OF SERVICE: 08/30/2020  PATIENT:  Santa Lighter  75 y.o. female  PRE-OPERATIVE DIAGNOSIS:  Atherosclerosis of native arteries of bilateral lower extremities with tissue loss  POST-OPERATIVE DIAGNOSIS:  Same  PROCEDURE:  1) US guided LCFA access 2) Aortogram 3) RLE angiogram with third order cannulation (130m contrast) 4) R SFA / popliteal stenting (6x1238mEluvia x 3)  SURGEON:  Surgeon(s) and Role:    * Hawken, [name], MD - Primary  ASSISTANT: none  ANESTHESIA:   local  EBL: min  BLOOD ADMINISTERED:none  DRAINS: none  LOCAL MEDICATIONS USED:  LIDOCAINE  SPECIMEN:  none  COUNTS: confirmed correct.  TOURNIQUET:  None  PATIENT DISPOSITION:  PACU - hemodynamically stable.  Delay start of Pharmacological VTE agent (>24hrs) due to surgical blood loss or risk of bleeding: no  INDICATION FOR PROCEDURE: BRALEE FELDT is a 75 y.o. female with atherosclerosis of native arteries of bilateral lower extremities with tissue loss.  Despite left upper and lower extremity contractures, her daughter affirms that she is ambulatory.  After careful discussion of risks, benefits, and alternatives the patient was offered angiography with intervention. The patient's daughter understood and wished to proceed.  OPERATIVE FINDINGS: Unremarkable aortogram.  Diminutive iliac arteries bilaterally.  Common and profundofemoral arteries are patent without flow-limiting stenosis.  Long segment right SFA occlusion reconstituting flow in the behind knee popliteal artery.  Two-vessel runoff on the right via the peroneal and anterior tibial artery.  Long segment left SFA occlusion which appears flush at the origin.  Constitutional flow behind the knee on the left.  Three-vessel runoff to the foot on the left.  Anterior tibial artery appears dominant on the left.  Successful  endovascular recanalization of the right superficial femoral artery occlusion.  Triphasic flow at the ankle at the AT on completion.  Patient will need staged left lower extremity angiogram with intervention.  She is not a open vascular surgical candidate.  We will attempt to recanalize her left leg likely via tibial access to treat flush SFA occlusion next week.  Start dual antiplatelet therapy.  Start statin.  Okay for discharge with outpatient follow-up for angiogram from my perspective.  DESCRIPTION OF PROCEDURE: After identification of the patient in the pre-operative holding area, the patient was transferred to the operating room. The patient was positioned supine on the operating room table. Anesthesia was induced. The groins was prepped and draped in standard fashion. A surgical pause was performed confirming correct patient, procedure, and operative location.  The left groin was anesthetized with subcutaneous injection of 1% lidocaine. Using ultrasound guidance, the left common femoral artery was accessed with micropuncture technique. Fluoroscopy was used to confirm cannulation over the femoral head. Sheathogram was not performed. The 662F sheath was upsized to 62F.  An [place] was advanced into the distal aorta. Over the wire an omni flush catheter was advanced to the level of L2. Aortogram was performed - see above for details.  Bilateral lower extremity runoff was also performed.  See above for details.  The right common iliac artery was selected with the [place]. The wire was advanced into the superficial femoral artery.  Patient was heparinized with 5000 units of IV heparin.  A 6 French by 45 cm sheath was advanced into the proximal right superficial femoral artery.  Selective angiography was performed - see above for details.  I was able to navigate a 035 angled Glidewire and quick cross catheter into a subintimal plane and crossed the lesion.  I reentered in the behind knee popliteal  artery.  The catheter was advanced into the native artery.  Angiography was performed through the catheter to confirm intraluminal position.  The wire was delivered into the tibial arteries.  The long segment occlusion was recanalized with three 6 x 120 mm Ole[place] stents deployed in standard fashion.  These were postdilated with a 5  x 150 mm [place] balloon.  There was good angiographic result on the completion.  A triphasic anterior tibial artery Doppler signal was auscultated on the right.  A Perclose device was used to close the arteriotomy. Hemostasis was excellent upon completion.  Upon completion of the case instrument and sharps counts were confirmed correct. The patient was transferred to the PACU in good condition. I was present for all portions of the procedure.  [name], MD [facility] Phone Number: [phone] 08/30/2020 10:59 AM  US ARTERIAL SEG MULTIPLE LE (ABI, SEGMENTAL PRESSURES, PVR'S)  Result Date: 08/20/2020 CLINICAL DATA:  Nonhealing bilateral lower extremity wounds. History of hyperlipidemia, diabetes and former smoker. EXAM: NONINVASIVE PHYSIOLOGIC VASCULAR STUDY OF BILATERAL LOWER EXTREMITIES TECHNIQUE: Non-invasive vascular evaluation of both lower extremities was performed at rest, including calculation of ankle-brachial indices, multiple segmental pressure evaluation, segmental Doppler and segmental pulse volume recording. COMPARISON:  None. FINDINGS: Right Lower Extremity Resting ABI:  0.50 Resting TBI: 0.28 Segmental Pressures: Significant drop in pressure from the level of the proximal thigh to the distal thigh. Great toe pressure: 46 mm Hg Arterial Waveforms: Triphasic femoral waveform and monophasic popliteal, posterior tibial and dorsalis pedis waveforms. PVRs: Depressed amplitude at the level of the calf, ankle and first toe. Left Lower Extremity: Resting ABI: 0.57 Resting TBI: 0.33 Segmental Pressures: Similar significant drop  in pressure from the level of the proximal thigh to the distal thigh. Great toe pressure: 53 mm Hg Arterial Waveforms: Monophasic waveforms throughout the left lower extremity. PVRs: Depressed amplitude at the level of the calf, ankle and first toe. Other: Symmetric upper extremity pressures. Ankle Brachial index > 1.4 Non diagnostic secondary to incompressible vessel calcifications 1.0-1.4       Normal 0.9-0.99     Borderline PAD 0.8-0.89     Mild PAD 0.5-0.79     Moderate PAD < 0.5          Severe PAD Toe Brachial Index Normal     >0.65 Moderate  0.53-0.64 Severe     <0.23 Toe Pressures Absolute toe pressure >58mHg sufficient for wound healing. Toe pressures <566mg = critical limb ischemia. IMPRESSION: Evidence of significant peripheral vascular disease and arterial occlusive disease in both lower extremities with moderately depressed ankle-brachial indices (0.50 on the right and 0.57 on the left). Significantly depressed toe pressures and toe brachial indices. Disease appears to be at multiple levels bilaterally. Consider further evaluation with direct vascular imaging such as CT angiography of the abdominal aorta with bilateral lower extremity runoff. Electronically Signed   By: GlAletta Edouard.D.   On: 08/20/2020 14:21   DG Ankle Left Port  Result Date: 08/09/2020 CLINICAL DATA:  Osteomyelitis. EXAM: PORTABLE LEFT ANKLE - 2 VIEW COMPARISON:  None. FINDINGS: There is no evidence of fracture, dislocation, or joint effusion. There is no evidence of arthropathy or other focal bone abnormality. No lytic destruction is seen to suggest osteomyelitis. Soft tissue swelling is seen over lateral malleolus. IMPRESSION: No fracture or dislocation is noted. No lytic destruction is seen to suggest osteomyelitis. Soft tissue swelling is seen over lateral malleolus. Electronically Signed   By: JaMarijo Conception.D.   On: 08/09/2020 17:56   DG Foot 2 Views Right  Result Date: 08/26/2020 CLINICAL DATA:  First toe  infection EXAM: RIGHT FOOT - 2 VIEW COMPARISON:  None. FINDINGS: No acute fracture or dislocation is noted. No gross soft tissue abnormality is seen. Calcaneal spurring is noted. IMPRESSION: No  acute abnormality seen. Electronically Signed   By: Inez Catalina M.D.   On: 08/26/2020 21:36       Subjective:  No new complaints.  Discharge Exam: Vitals:   09/06/20 0436 09/06/20 0720  BP: (!) 123/45 (!) 126/51  Pulse: 82 81  Resp: 18 18  Temp: 99 F (37.2 C) 99.1 F (37.3 C)  SpO2: 91% 95%   Vitals:   09/05/20 1301 09/05/20 2113 09/06/20 0436 09/06/20 0720  BP: (!) 107/56 (!) 136/52 (!) 123/45 (!) 126/51  Pulse: 81 80 82 81  Resp: 20 18 18 18   Temp: 99.2 F (37.3 C) 99.2 F (37.3 C) 99 F (37.2 C) 99.1 F (37.3 C)  TempSrc: Oral Oral Oral Oral  SpO2: 95% 93% 91% 95%  Weight:      Height:        General: Pt is alert, awake, not in acute distress Cardiovascular: RRR, S1/S2 +, no rubs, no gallops Respiratory: CTA bilaterally, no wheezing, no rhonchi Abdominal: Soft, NT, ND, bowel sounds + Extremities: no edema, no cyanosis    The results of significant diagnostics from this hospitalization (including imaging, microbiology, ancillary and laboratory) are listed below for reference.     Microbiology: No results found for this or any previous visit (from the past 240 hour(s)).   Labs: BNP (last 3 results) Recent Labs    08/26/20 2037  BNP 99.2   Basic Metabolic Panel: Recent Labs  Lab 08/31/20 0234 09/01/20 0255 09/02/20 0554 09/04/20 0145  NA 136  --   --  136  K 4.0  --   --  4.0  CL 104  --   --  101  CO2 23  --   --  26  GLUCOSE 145*  --   --  104*  BUN 11  --   --  10  CREATININE 0.61  --  0.76 0.61  CALCIUM 8.1*  --   --  9.1  MG 1.6* 1.8  --   --    Liver Function Tests: No results for input(s): AST, ALT, ALKPHOS, BILITOT, PROT, ALBUMIN in the last 168 hours. No results for input(s): LIPASE, AMYLASE in the last 168 hours. No results for input(s):  AMMONIA in the last 168 hours. CBC: Recent Labs  Lab 09/01/20 0255 09/02/20 0554 09/03/20 0302 09/04/20 0145 09/05/20 0937  WBC 10.8* 11.3* 10.0 9.8 13.1*  HGB 8.1* 8.3* 7.6* 8.5* 7.9*  HCT 24.8* 25.6* 24.4* 27.6* 25.4*  MCV 76.5* 76.6* 79.0* 78.4* 77.4*  PLT 351 374 348 414* 418*   Cardiac Enzymes: No results for input(s): CKTOTAL, CKMB, CKMBINDEX, TROPONINI in the last 168 hours. BNP: Invalid input(s): POCBNP CBG: Recent Labs  Lab 09/05/20 0759 09/05/20 1150 09/05/20 1650 09/05/20 2112 09/06/20 0801  GLUCAP 89 192* 206* 207* 153*   D-Dimer No results for input(s): DDIMER in the last 72 hours. Hgb A1c No results for input(s): HGBA1C in the last 72 hours. Lipid Profile No results for input(s): CHOL, HDL, LDLCALC, TRIG, CHOLHDL, LDLDIRECT in the last 72 hours. Thyroid function studies No results for input(s): TSH, T4TOTAL, T3FREE, THYROIDAB in the last 72 hours.  Invalid input(s): FREET3 Anemia work up No results for input(s): VITAMINB12, FOLATE, FERRITIN, TIBC, IRON, RETICCTPCT in the last 72 hours. Urinalysis    Component Value Date/Time   COLORURINE YELLOW 01/14/2019 1010   APPEARANCEUR HAZY (A) 01/14/2019 1010   LABSPEC 1.019 01/14/2019 1010   PHURINE 5.0 01/14/2019 1010   GLUCOSEU NEGATIVE 01/14/2019 1010  HGBUR NEGATIVE 01/14/2019 1010   BILIRUBINUR NEGATIVE 01/14/2019 1010   KETONESUR NEGATIVE 01/14/2019 1010   PROTEINUR NEGATIVE 01/14/2019 1010   NITRITE NEGATIVE 01/14/2019 1010   LEUKOCYTESUR NEGATIVE 01/14/2019 1010   Sepsis Labs Invalid input(s): PROCALCITONIN,  WBC,  LACTICIDVEN Microbiology No results found for this or any previous visit (from the past 240 hour(s)).   Time coordinating discharge: 35 minutes  SIGNED:   Hosie Poisson, MD  [facility] 09/06/2020, 10:06 AM

## 2020-09-10 DIAGNOSIS — I6350 Cerebral infarction due to unspecified occlusion or stenosis of unspecified cerebral artery: Secondary | ICD-10-CM | POA: Diagnosis not present

## 2020-09-10 DIAGNOSIS — L97511 Non-pressure chronic ulcer of other part of right foot limited to breakdown of skin: Secondary | ICD-10-CM | POA: Diagnosis not present

## 2020-09-10 DIAGNOSIS — I739 Peripheral vascular disease, unspecified: Secondary | ICD-10-CM | POA: Diagnosis not present

## 2020-09-10 DIAGNOSIS — L97328 Non-pressure chronic ulcer of left ankle with other specified severity: Secondary | ICD-10-CM | POA: Diagnosis not present

## 2020-09-10 DIAGNOSIS — E1151 Type 2 diabetes mellitus with diabetic peripheral angiopathy without gangrene: Secondary | ICD-10-CM | POA: Diagnosis not present

## 2020-09-10 DIAGNOSIS — R531 Weakness: Secondary | ICD-10-CM | POA: Diagnosis not present

## 2020-09-10 DIAGNOSIS — E11622 Type 2 diabetes mellitus with other skin ulcer: Secondary | ICD-10-CM | POA: Diagnosis not present

## 2020-09-10 DIAGNOSIS — R2681 Unsteadiness on feet: Secondary | ICD-10-CM | POA: Diagnosis not present

## 2020-09-11 DIAGNOSIS — E1151 Type 2 diabetes mellitus with diabetic peripheral angiopathy without gangrene: Secondary | ICD-10-CM | POA: Diagnosis not present

## 2020-09-11 DIAGNOSIS — L97511 Non-pressure chronic ulcer of other part of right foot limited to breakdown of skin: Secondary | ICD-10-CM | POA: Diagnosis not present

## 2020-09-11 DIAGNOSIS — R531 Weakness: Secondary | ICD-10-CM | POA: Diagnosis not present

## 2020-09-11 DIAGNOSIS — I739 Peripheral vascular disease, unspecified: Secondary | ICD-10-CM | POA: Diagnosis not present

## 2020-09-11 DIAGNOSIS — E11622 Type 2 diabetes mellitus with other skin ulcer: Secondary | ICD-10-CM | POA: Diagnosis not present

## 2020-09-11 DIAGNOSIS — L97328 Non-pressure chronic ulcer of left ankle with other specified severity: Secondary | ICD-10-CM | POA: Diagnosis not present

## 2020-09-11 DIAGNOSIS — R2681 Unsteadiness on feet: Secondary | ICD-10-CM | POA: Diagnosis not present

## 2020-09-12 DIAGNOSIS — E11622 Type 2 diabetes mellitus with other skin ulcer: Secondary | ICD-10-CM | POA: Diagnosis not present

## 2020-09-12 DIAGNOSIS — L97328 Non-pressure chronic ulcer of left ankle with other specified severity: Secondary | ICD-10-CM | POA: Diagnosis not present

## 2020-09-12 DIAGNOSIS — L97511 Non-pressure chronic ulcer of other part of right foot limited to breakdown of skin: Secondary | ICD-10-CM | POA: Diagnosis not present

## 2020-09-12 DIAGNOSIS — E1151 Type 2 diabetes mellitus with diabetic peripheral angiopathy without gangrene: Secondary | ICD-10-CM | POA: Diagnosis not present

## 2020-09-12 DIAGNOSIS — I739 Peripheral vascular disease, unspecified: Secondary | ICD-10-CM | POA: Diagnosis not present

## 2020-09-12 DIAGNOSIS — R531 Weakness: Secondary | ICD-10-CM | POA: Diagnosis not present

## 2020-09-12 DIAGNOSIS — R2681 Unsteadiness on feet: Secondary | ICD-10-CM | POA: Diagnosis not present

## 2020-09-13 ENCOUNTER — Telehealth: Payer: Self-pay

## 2020-09-13 ENCOUNTER — Other Ambulatory Visit: Payer: Self-pay | Admitting: *Deleted

## 2020-09-13 DIAGNOSIS — L97321 Non-pressure chronic ulcer of left ankle limited to breakdown of skin: Secondary | ICD-10-CM

## 2020-09-13 DIAGNOSIS — L03031 Cellulitis of right toe: Secondary | ICD-10-CM

## 2020-09-13 NOTE — Telephone Encounter (Signed)
Received VM about patient having swelling in L leg, she is s/p angio and intervention on 2/9 - tried to call back - UTR or leave VM.

## 2020-09-14 ENCOUNTER — Inpatient Hospital Stay (HOSPITAL_COMMUNITY)
Admission: EM | Admit: 2020-09-14 | Discharge: 2020-09-18 | DRG: 378 | Disposition: A | Discharge status: Home Health | Payer: Medicare Other | Attending: Internal Medicine | Admitting: Internal Medicine

## 2020-09-14 ENCOUNTER — Other Ambulatory Visit: Payer: Self-pay

## 2020-09-14 ENCOUNTER — Emergency Department (HOSPITAL_COMMUNITY): Payer: Medicare Other

## 2020-09-14 ENCOUNTER — Encounter (HOSPITAL_COMMUNITY): Payer: Self-pay | Admitting: Emergency Medicine

## 2020-09-14 DIAGNOSIS — E1159 Type 2 diabetes mellitus with other circulatory complications: Secondary | ICD-10-CM | POA: Diagnosis not present

## 2020-09-14 DIAGNOSIS — Z8616 Personal history of COVID-19: Secondary | ICD-10-CM

## 2020-09-14 DIAGNOSIS — J9611 Chronic respiratory failure with hypoxia: Secondary | ICD-10-CM | POA: Diagnosis not present

## 2020-09-14 DIAGNOSIS — R911 Solitary pulmonary nodule: Secondary | ICD-10-CM | POA: Diagnosis not present

## 2020-09-14 DIAGNOSIS — I10 Essential (primary) hypertension: Secondary | ICD-10-CM | POA: Diagnosis present

## 2020-09-14 DIAGNOSIS — J849 Interstitial pulmonary disease, unspecified: Secondary | ICD-10-CM | POA: Diagnosis present

## 2020-09-14 DIAGNOSIS — F0151 Vascular dementia with behavioral disturbance: Secondary | ICD-10-CM | POA: Diagnosis not present

## 2020-09-14 DIAGNOSIS — K921 Melena: Secondary | ICD-10-CM | POA: Diagnosis not present

## 2020-09-14 DIAGNOSIS — R404 Transient alteration of awareness: Secondary | ICD-10-CM | POA: Diagnosis not present

## 2020-09-14 DIAGNOSIS — I69354 Hemiplegia and hemiparesis following cerebral infarction affecting left non-dominant side: Secondary | ICD-10-CM

## 2020-09-14 DIAGNOSIS — F17210 Nicotine dependence, cigarettes, uncomplicated: Secondary | ICD-10-CM | POA: Diagnosis present

## 2020-09-14 DIAGNOSIS — E785 Hyperlipidemia, unspecified: Secondary | ICD-10-CM | POA: Diagnosis present

## 2020-09-14 DIAGNOSIS — J449 Chronic obstructive pulmonary disease, unspecified: Secondary | ICD-10-CM | POA: Diagnosis not present

## 2020-09-14 DIAGNOSIS — Z9981 Dependence on supplemental oxygen: Secondary | ICD-10-CM

## 2020-09-14 DIAGNOSIS — R531 Weakness: Secondary | ICD-10-CM | POA: Diagnosis not present

## 2020-09-14 DIAGNOSIS — K31819 Angiodysplasia of stomach and duodenum without bleeding: Secondary | ICD-10-CM

## 2020-09-14 DIAGNOSIS — E119 Type 2 diabetes mellitus without complications: Secondary | ICD-10-CM

## 2020-09-14 DIAGNOSIS — Z794 Long term (current) use of insulin: Secondary | ICD-10-CM | POA: Diagnosis not present

## 2020-09-14 DIAGNOSIS — Z7982 Long term (current) use of aspirin: Secondary | ICD-10-CM

## 2020-09-14 DIAGNOSIS — R778 Other specified abnormalities of plasma proteins: Secondary | ICD-10-CM | POA: Diagnosis not present

## 2020-09-14 DIAGNOSIS — E876 Hypokalemia: Secondary | ICD-10-CM | POA: Diagnosis not present

## 2020-09-14 DIAGNOSIS — I739 Peripheral vascular disease, unspecified: Secondary | ICD-10-CM | POA: Diagnosis not present

## 2020-09-14 DIAGNOSIS — E1151 Type 2 diabetes mellitus with diabetic peripheral angiopathy without gangrene: Secondary | ICD-10-CM | POA: Diagnosis not present

## 2020-09-14 DIAGNOSIS — F0390 Unspecified dementia without behavioral disturbance: Secondary | ICD-10-CM | POA: Diagnosis present

## 2020-09-14 DIAGNOSIS — I517 Cardiomegaly: Secondary | ICD-10-CM | POA: Diagnosis not present

## 2020-09-14 DIAGNOSIS — Z7984 Long term (current) use of oral hypoglycemic drugs: Secondary | ICD-10-CM | POA: Diagnosis not present

## 2020-09-14 DIAGNOSIS — D62 Acute posthemorrhagic anemia: Secondary | ICD-10-CM | POA: Diagnosis present

## 2020-09-14 DIAGNOSIS — K31811 Angiodysplasia of stomach and duodenum with bleeding: Principal | ICD-10-CM | POA: Diagnosis present

## 2020-09-14 DIAGNOSIS — D649 Anemia, unspecified: Secondary | ICD-10-CM

## 2020-09-14 DIAGNOSIS — K922 Gastrointestinal hemorrhage, unspecified: Secondary | ICD-10-CM | POA: Diagnosis not present

## 2020-09-14 DIAGNOSIS — Z7902 Long term (current) use of antithrombotics/antiplatelets: Secondary | ICD-10-CM | POA: Diagnosis not present

## 2020-09-14 DIAGNOSIS — L8990 Pressure ulcer of unspecified site, unspecified stage: Secondary | ICD-10-CM | POA: Insufficient documentation

## 2020-09-14 DIAGNOSIS — Z79899 Other long term (current) drug therapy: Secondary | ICD-10-CM

## 2020-09-14 DIAGNOSIS — E1165 Type 2 diabetes mellitus with hyperglycemia: Secondary | ICD-10-CM | POA: Diagnosis present

## 2020-09-14 DIAGNOSIS — Z9889 Other specified postprocedural states: Secondary | ICD-10-CM

## 2020-09-14 DIAGNOSIS — Z743 Need for continuous supervision: Secondary | ICD-10-CM | POA: Diagnosis not present

## 2020-09-14 DIAGNOSIS — R6889 Other general symptoms and signs: Secondary | ICD-10-CM | POA: Diagnosis not present

## 2020-09-14 DIAGNOSIS — L89899 Pressure ulcer of other site, unspecified stage: Secondary | ICD-10-CM | POA: Insufficient documentation

## 2020-09-14 DIAGNOSIS — M21371 Foot drop, right foot: Secondary | ICD-10-CM | POA: Diagnosis not present

## 2020-09-14 DIAGNOSIS — R9431 Abnormal electrocardiogram [ECG] [EKG]: Secondary | ICD-10-CM | POA: Diagnosis not present

## 2020-09-14 DIAGNOSIS — L89302 Pressure ulcer of unspecified buttock, stage 2: Secondary | ICD-10-CM | POA: Diagnosis present

## 2020-09-14 LAB — CBC WITH DIFFERENTIAL/PLATELET
Abs Immature Granulocytes: 0.21 10*3/uL — ABNORMAL HIGH (ref 0.00–0.07)
Basophils Absolute: 0 10*3/uL (ref 0.0–0.1)
Basophils Relative: 0 %
Eosinophils Absolute: 0 10*3/uL (ref 0.0–0.5)
Eosinophils Relative: 0 %
HCT: 15.6 % — ABNORMAL LOW (ref 36.0–46.0)
Hemoglobin: 4.5 g/dL — CL (ref 12.0–15.0)
Immature Granulocytes: 1 %
Lymphocytes Relative: 6 %
Lymphs Abs: 1 10*3/uL (ref 0.7–4.0)
MCH: 26.5 pg (ref 26.0–34.0)
MCHC: 28.8 g/dL — ABNORMAL LOW (ref 30.0–36.0)
MCV: 91.8 fL (ref 80.0–100.0)
Monocytes Absolute: 0.7 10*3/uL (ref 0.1–1.0)
Monocytes Relative: 5 %
Neutro Abs: 13.8 10*3/uL — ABNORMAL HIGH (ref 1.7–7.7)
Neutrophils Relative %: 88 %
Platelets: 298 10*3/uL (ref 150–400)
RBC: 1.7 MIL/uL — ABNORMAL LOW (ref 3.87–5.11)
RDW: 23.9 % — ABNORMAL HIGH (ref 11.5–15.5)
WBC: 15.7 10*3/uL — ABNORMAL HIGH (ref 4.0–10.5)
nRBC: 3.5 % — ABNORMAL HIGH (ref 0.0–0.2)

## 2020-09-14 LAB — URINALYSIS, ROUTINE W REFLEX MICROSCOPIC
Bacteria, UA: NONE SEEN
Bilirubin Urine: NEGATIVE
Glucose, UA: >=500 mg/dL — AB
Hgb urine dipstick: NEGATIVE
Ketones, ur: NEGATIVE mg/dL
Leukocytes,Ua: NEGATIVE
Nitrite: NEGATIVE
Protein, ur: NEGATIVE mg/dL
Specific Gravity, Urine: 1.018 (ref 1.005–1.030)
pH: 5 (ref 5.0–8.0)

## 2020-09-14 LAB — COMPREHENSIVE METABOLIC PANEL
ALT: 19 U/L (ref 0–44)
AST: 26 U/L (ref 15–41)
Albumin: 2.3 g/dL — ABNORMAL LOW (ref 3.5–5.0)
Alkaline Phosphatase: 60 U/L (ref 38–126)
Anion gap: 19 — ABNORMAL HIGH (ref 5–15)
BUN: 37 mg/dL — ABNORMAL HIGH (ref 8–23)
CO2: 15 mmol/L — ABNORMAL LOW (ref 22–32)
Calcium: 8.8 mg/dL — ABNORMAL LOW (ref 8.9–10.3)
Chloride: 103 mmol/L (ref 98–111)
Creatinine, Ser: 1.04 mg/dL — ABNORMAL HIGH (ref 0.44–1.00)
GFR, Estimated: 57 mL/min — ABNORMAL LOW (ref >60)
Glucose, Bld: 390 mg/dL — ABNORMAL HIGH (ref 70–99)
Potassium: 4.9 mmol/L (ref 3.5–5.1)
Sodium: 137 mmol/L (ref 135–145)
Total Bilirubin: 0.6 mg/dL (ref 0.3–1.2)
Total Protein: 6.3 g/dL — ABNORMAL LOW (ref 6.5–8.1)

## 2020-09-14 LAB — CBG MONITORING, ED
Glucose-Capillary: 293 mg/dL — ABNORMAL HIGH (ref 70–99)
Glucose-Capillary: 304 mg/dL — ABNORMAL HIGH (ref 70–99)
Glucose-Capillary: 358 mg/dL — ABNORMAL HIGH (ref 70–99)

## 2020-09-14 LAB — TROPONIN I (HIGH SENSITIVITY)
Troponin I (High Sensitivity): 32 ng/L — ABNORMAL HIGH (ref <18)
Troponin I (High Sensitivity): 48 ng/L — ABNORMAL HIGH (ref <18)

## 2020-09-14 LAB — PREPARE RBC (CROSSMATCH)

## 2020-09-14 LAB — POC OCCULT BLOOD, ED: Fecal Occult Bld: POSITIVE — AB

## 2020-09-14 LAB — ABO/RH: ABO/RH(D): O POS

## 2020-09-14 MED ORDER — ACETAMINOPHEN 650 MG RE SUPP: 650.0000 mg | Freq: Four times a day (QID) | RECTAL | Status: DC | PRN

## 2020-09-14 MED ORDER — SODIUM CHLORIDE 0.9% IV SOLUTION
Freq: Once | INTRAVENOUS | Status: AC
  Administered 2020-09-14: 10 mL/h via INTRAVENOUS

## 2020-09-14 MED ORDER — INSULIN ASPART 100 UNIT/ML ~~LOC~~ SOLN
0.0000 [IU] | Freq: Every day | SUBCUTANEOUS | Status: DC
  Administered 2020-09-14: 3 [IU] via SUBCUTANEOUS

## 2020-09-14 MED ORDER — ALBUTEROL SULFATE (2.5 MG/3ML) 0.083% IN NEBU: 2.5000 mg | INHALATION_SOLUTION | RESPIRATORY_TRACT | Status: DC | PRN

## 2020-09-14 MED ORDER — SODIUM CHLORIDE 0.9 % IV BOLUS
500.0000 mL | Freq: Once | INTRAVENOUS | Status: AC
  Administered 2020-09-14: 500 mL via INTRAVENOUS

## 2020-09-14 MED ORDER — PANTOPRAZOLE SODIUM 40 MG IV SOLR
40.0000 mg | Freq: Two times a day (BID) | INTRAVENOUS | Status: DC
  Administered 2020-09-15: 40 mg via INTRAVENOUS
  Filled 2020-09-14: qty 40

## 2020-09-14 MED ORDER — PANTOPRAZOLE SODIUM 40 MG IV SOLR
40.0000 mg | Freq: Once | INTRAVENOUS | Status: AC
  Administered 2020-09-14: 40 mg via INTRAVENOUS
  Filled 2020-09-14: qty 40

## 2020-09-14 MED ORDER — GABAPENTIN 300 MG PO CAPS
300.0000 mg | ORAL_CAPSULE | Freq: Three times a day (TID) | ORAL | Status: DC
  Administered 2020-09-15 – 2020-09-18 (×7): 300 mg via ORAL
  Filled 2020-09-14 (×8): qty 1

## 2020-09-14 MED ORDER — ONDANSETRON HCL 4 MG/2ML IJ SOLN: 4.0000 mg | Freq: Four times a day (QID) | INTRAMUSCULAR | Status: DC | PRN

## 2020-09-14 MED ORDER — ONDANSETRON HCL 4 MG PO TABS: 4.0000 mg | ORAL_TABLET | Freq: Four times a day (QID) | ORAL | Status: DC | PRN

## 2020-09-14 MED ORDER — ACETAMINOPHEN 325 MG PO TABS: 650.0000 mg | ORAL_TABLET | Freq: Four times a day (QID) | ORAL | Status: DC | PRN

## 2020-09-14 MED ORDER — INSULIN ASPART 100 UNIT/ML ~~LOC~~ SOLN
0.0000 [IU] | Freq: Three times a day (TID) | SUBCUTANEOUS | Status: DC
  Administered 2020-09-16: 1 [IU] via SUBCUTANEOUS
  Administered 2020-09-16: 2 [IU] via SUBCUTANEOUS
  Administered 2020-09-18: 1 [IU] via SUBCUTANEOUS
  Administered 2020-09-18: 2 [IU] via SUBCUTANEOUS

## 2020-09-14 MED ORDER — INSULIN GLARGINE 100 UNIT/ML ~~LOC~~ SOLN
15.0000 [IU] | Freq: Every day | SUBCUTANEOUS | Status: DC
  Administered 2020-09-16 – 2020-09-18 (×3): 15 [IU] via SUBCUTANEOUS
  Filled 2020-09-14 (×4): qty 0.15

## 2020-09-14 MED ORDER — ATORVASTATIN CALCIUM 80 MG PO TABS
80.0000 mg | ORAL_TABLET | Freq: Every day | ORAL | Status: DC
  Administered 2020-09-16 – 2020-09-17 (×2): 80 mg via ORAL
  Filled 2020-09-14 (×3): qty 1

## 2020-09-14 MED ORDER — HALOPERIDOL LACTATE 5 MG/ML IJ SOLN
INTRAMUSCULAR | Status: AC
  Administered 2020-09-14: 2 mg via INTRAMUSCULAR
  Filled 2020-09-14: qty 1

## 2020-09-14 MED ORDER — HALOPERIDOL LACTATE 5 MG/ML IJ SOLN: 2.0000 mg | Freq: Once | INTRAMUSCULAR | Status: AC

## 2020-09-14 NOTE — ED Notes (Signed)
IV team unable to get access, Dr. Maryan Rued notified.

## 2020-09-14 NOTE — ED Notes (Signed)
Pt very confuse and agitated on the room, family getting frustrated with the pt, because not be able to help her stay still. Admitting provider notified.

## 2020-09-14 NOTE — ED Notes (Signed)
Pt getting very anxious and agitated, trying to get out of bed because feeling the need to urinate and not able to do it. In and out cath done, urine drained and urine sample sent to the lab.

## 2020-09-14 NOTE — ED Notes (Signed)
IV attempted x 2 on this pt with no success, no IV access at this time for blood transfusion, IV team consulted for IV access.

## 2020-09-14 NOTE — ED Notes (Signed)
Date and time results received: 09/14/20   Test: Hg Critical Value: 4.5  Name of Provider Notified: [place]

## 2020-09-14 NOTE — ED Provider Notes (Signed)
[facility] EMERGENCY DEPARTMENT Provider Note   CSN: [id number] Arrival date & time: 09/14/20  1632     History Chief Complaint  Patient presents with  . Altered Mental Status    Crystal Moore is a 75 y.o. female.  Patient is a 75 year old female with multiple medical problems including prior stroke with left-sided hemiparesis, diabetes, recent Covid infection in January, COPD and recent hospital admission for stent placement in her lower extremities for PAD who was discharged from the hospital 1 week ago who is presenting today from home for lethargy and weakness.  Patient's daughter provides the history and reports that yesterday she was her normal self and then today when she went to get her out of bed she seemed more sluggish and not quite herself.  Patient has been eating and drinking normally.  Daughter did note a very dark stool today and patient just did not seem herself.  She was slower to respond but reported that she was okay.  Patient has not had cough, fever, abdominal pain, nausea or vomiting.  Since being from from the hospital she has had multiple bowel movements that have been normal until the one today.  Also today daughter noticed that patient's blood sugar was elevated.  Her blood sugar has been between 90 and 125 until today when she checked it was in the 200s despite taking her normal diabetic medications.  She is currently taking aspirin and Plavix.  Daughter reports she had a bleeding ulcer years and years ago but no other known GI bleeding.  The history is provided by the EMS personnel, a caregiver and medical records.  Altered Mental Status      Past Medical History:  Diagnosis Date  . Arthritis   . COPD (chronic obstructive pulmonary disease) ([place])   . COVID-19 virus infection 07/2020  . CVA (cerebral vascular accident) ([place])   . Diabetes ([place])   . Diabetes mellitus without complication ([place])   . Hemiparesis affecting left side as late  effect of cerebrovascular accident (CVA) ([place])   . Pyelonephritis   . Seizures ([place])   . Stroke ([place])   . Tobacco use   . Tobacco use disorder   . UTI (urinary tract infection)     Patient Active Problem List   Diagnosis Date Noted  . Cellulitis of great toe of right foot 08/26/2020  . CVA (cerebral vascular accident) ([place])   . COVID-19 virus infection 07/2020  . Ankle ulcer ([place]) 06/28/2020  . AKI (acute kidney injury) ([place]) 01/15/2019  . Cerebral thrombosis with cerebral infarction 01/15/2019  . TIA (transient ischemic attack) 01/13/2019  . Type 2 diabetes mellitus with vascular disease ([place]) 01/13/2019  . Hypokalemia 12/18/2018  . Acute CVA (cerebrovascular accident) ([place]) 12/17/2018  . Malnutrition of moderate degree 05/31/2018  . Hematochezia 05/29/2018  . Ischemic colitis ([place]) 05/29/2018  . Acute encephalopathy 05/29/2018  . Type 2 diabetes mellitus ([place]) 05/29/2018  . Hypertension 05/29/2018  . Depression 05/29/2018  . COPD (chronic obstructive pulmonary disease) ([place]) 05/29/2018  . Sepsis ([place]) 05/28/2018  . Diabetes ([place]) 03/16/2018  . ILD (interstitial lung disease) ([place]) 02/18/2018  . Chronic respiratory failure with hypoxia ([place]) 02/18/2018  . COPD (chronic obstructive pulmonary disease) ([place]) 01/12/2018  . Tobacco abuse 01/12/2018    Past Surgical History:  Procedure Laterality Date  . ABDOMINAL AORTOGRAM W/LOWER EXTREMITY N/A 08/30/2020   Procedure: ABDOMINAL AORTOGRAM W/LOWER EXTREMITY;  Surgeon: Cherre Robins, MD;  Location: [facility];  Service:  Cardiovascular;  Laterality: N/A;  . ABDOMINAL AORTOGRAM W/LOWER EXTREMITY N/A 09/04/2020   Procedure: ABDOMINAL AORTOGRAM W/LOWER EXTREMITY;  Surgeon: Cherre Robins, MD;  Location: [facility];  Service: Cardiovascular;  Laterality: N/A;  . PERIPHERAL VASCULAR INTERVENTION Right 08/30/2020   Procedure: PERIPHERAL VASCULAR INTERVENTION;  Surgeon: Cherre Robins, MD;  Location: [facility];   Service: Cardiovascular;  Laterality: Right;  femoral popliteal  . PERIPHERAL VASCULAR INTERVENTION Left 09/04/2020   Procedure: PERIPHERAL VASCULAR INTERVENTION;  Surgeon: Cherre Robins, MD;  Location: [facility];  Service: Cardiovascular;  Laterality: Left;  SFA  . TUBAL LIGATION       OB History   No obstetric history on file.     Family History  Problem Relation Age of Onset  . Diabetes Mother   . Diabetes Sister   . Diabetes Brother     Social History   Tobacco Use  . Smoking status: Current Every Day Smoker    Packs/day: 1.00    Years: 40.00    Pack years: 40.00    Types: Cigarettes  . Smokeless tobacco: Never Used  Vaping Use  . Vaping Use: Never used  Substance Use Topics  . Alcohol use: Never  . Drug use: Never    Home Medications Prior to Admission medications   Medication Sig Start Date End Date Taking? Authorizing Provider  acetaminophen (TYLENOL) 325 MG tablet Take 2 tablets (650 mg total) by mouth every 4 (four) hours as needed for headache, mild pain, moderate pain or fever. 09/06/20   Hosie Poisson, MD  albuterol (PROVENTIL) (2.5 MG/3ML) 0.083% nebulizer solution Inhale 3 mLs (2.5 mg total) into the lungs every 4 (four) hours as needed for wheezing or shortness of breath. 01/17/19   Geradine Girt, DO  albuterol (VENTOLIN HFA) 108 (90 Base) MCG/ACT inhaler Inhale 1 puff into the lungs every 6 (six) hours as needed for wheezing or shortness of breath.    [provider]  amLODipine (NORVASC) 5 MG tablet Take 5 mg by mouth daily. 09/25/19   [provider]  aspirin 81 MG EC tablet Take by mouth daily. 11/07/15   [provider]  atorvastatin (LIPITOR) 80 MG tablet Take 1 tablet (80 mg total) by mouth daily at 6 PM. Patient taking differently: Take 80 mg by mouth daily. 12/21/18   Cristal Ford, DO  clopidogrel (PLAVIX) 75 MG tablet Take 1 tablet (75 mg total) by mouth daily. 09/06/20   Hosie Poisson, MD  escitalopram  (LEXAPRO) 10 MG tablet Take 1 tablet (10 mg total) by mouth at bedtime. Patient taking differently: Take 10 mg by mouth daily. 11/07/19   Cameron Sprang, MD  feeding supplement (ENSURE ENLIVE / ENSURE PLUS) LIQD Take 237 mLs by mouth 2 (two) times daily between meals. Patient taking differently: Take 237 mLs by mouth daily. 06/28/20   Renato Shin, MD  ferrous sulfate 325 (65 FE) MG tablet Take 1 tablet (325 mg total) by mouth 2 (two) times daily with a meal. 09/06/20   Hosie Poisson, MD  gabapentin (NEURONTIN) 300 MG capsule Take 1 capsule (300 mg total) by mouth 3 (three) times daily. 09/06/20   Hosie Poisson, MD  glucose blood (ACCU-CHEK GUIDE) test strip 1 each by Other route in the morning and at bedtime. And lancets 2/day 05/21/20   Renato Shin, MD  insulin glargine (LANTUS) 100 UNIT/ML injection Inject 0.2 mLs (20 Units total) into the skin daily. 09/07/20   Hosie Poisson, MD  TRADJENTA 5 MG TABS tablet Take 5 mg by mouth daily. 10/24/19   [provider]    Allergies    Patient has no known allergies.  Review of Systems   Review of Systems  Unable to perform ROS: Dementia    Physical Exam Updated Vital Signs BP 108/65   Pulse 81   Resp (!) 25   Ht 5\' 2"  (1.575 m)   Wt 60.1 kg   SpO2 95%   BMI 24.23 kg/m   Physical Exam Vitals and nursing note reviewed.  Constitutional:      General: She is not in acute distress.    Appearance: She is well-developed and well-nourished. She is ill-appearing.  HENT:     Head: Normocephalic and atraumatic.     Mouth/Throat:     Mouth: Mucous membranes are moist.  Eyes:     Extraocular Movements: EOM normal.     Pupils: Pupils are equal, round, and reactive to light.     Comments: Pale conjunctiva  Cardiovascular:     Rate and Rhythm: Regular rhythm. Tachycardia present.     Pulses: Normal pulses and intact distal pulses.     Heart sounds: Murmur heard.  No friction rub.  Pulmonary:     Effort: Pulmonary effort is normal.      Breath sounds: Normal breath sounds. No wheezing or rales.  Abdominal:     General: Bowel sounds are normal. There is no distension.     Palpations: Abdomen is soft.     Tenderness: There is no abdominal tenderness. There is no guarding or rebound.  Genitourinary:    Rectum: Guaiac result positive.     Comments: Stool is black Musculoskeletal:        General: No tenderness. Normal range of motion.     Cervical back: Normal range of motion and neck supple.     Right lower leg: No edema.     Left lower leg: No edema.     Comments: No edema  Skin:    General: Skin is warm and dry.     Capillary Refill: Capillary refill takes 2 to 3 seconds.     Coloration: Skin is pale.     Findings: No rash.  Neurological:     Mental Status: She is alert. Mental status is at baseline.     Comments: Left upper and lower ext weakness.  Left facial droop.  Mostly non-verbal  Psychiatric:        Mood and Affect: Mood and affect and mood normal.        Behavior: Behavior normal.        Thought Content: Thought content normal.     ED Results / Procedures / Treatments   Labs (all labs ordered are listed, but only abnormal results are displayed) Labs Reviewed  CBC WITH DIFFERENTIAL/PLATELET - Abnormal; Notable for the following components:      Result Value   WBC 15.7 (*)    RBC 1.70 (*)    Hemoglobin 4.5 (*)    HCT 15.6 (*)    MCHC 28.8 (*)    RDW 23.9 (*)    nRBC 3.5 (*)    All other components within normal limits  COMPREHENSIVE METABOLIC PANEL - Abnormal; Notable for the following components:   CO2 15 (*)    Glucose, Bld 390 (*)    BUN 37 (*)    Creatinine, Ser 1.04 (*)    Calcium 8.8 (*)    Total Protein 6.3 (*)  Albumin 2.3 (*)    GFR, Estimated 57 (*)    Anion gap 19 (*)    All other components within normal limits  CBG MONITORING, ED - Abnormal; Notable for the following components:   Glucose-Capillary 358 (*)    All other components within normal limits  POC OCCULT BLOOD,  ED - Abnormal; Notable for the following components:   Fecal Occult Bld POSITIVE (*)    All other components within normal limits  TROPONIN I (HIGH SENSITIVITY) - Abnormal; Notable for the following components:   Troponin I (High Sensitivity) 32 (*)    All other components within normal limits  TYPE AND SCREEN  ABO/RH  PREPARE RBC (CROSSMATCH)  TROPONIN I (HIGH SENSITIVITY)    EKG EKG Interpretation  Date/Time:  Saturday September 14 2020 17:10:01 EST Ventricular Rate:  94 PR Interval:    QRS Duration: 105 QT Interval:  348 QTC Calculation: 436 R Axis:   78 Text Interpretation: Sinus rhythm new Nonspecific repol abnormality, diffuse leads Confirmed by Blanchie Dessert ([id number]) on 09/14/2020 5:12:41 PM   Radiology DG Chest Port 1 View  Result Date: 09/14/2020 CLINICAL DATA:  Weakness.  Altered mental status. EXAM: PORTABLE CHEST 1 VIEW COMPARISON:  August 27, 2019 FINDINGS: There is persistent cardiomegaly. There are hazy bilateral airspace opacities and prominent interstitial lung markings. There is a focal airspace opacity projecting over the left lower lung zone and retrocardiac region. There is no pneumothorax. Aortic calcifications are again noted. There are old left-sided rib fractures. There is a stable calcified pulmonary nodule in the right mid lung zone. IMPRESSION: 1. Cardiomegaly with findings suggestive of mild interstitial edema. 2. Persistent airspace opacities bilaterally are nonspecific and may represent atelectasis or developing infiltrates in the appropriate clinical setting. Electronically Signed   By: Constance Holster M.D.   On: 09/14/2020 18:10    Procedures Procedures   Medications Ordered in ED Medications  sodium chloride 0.9 % bolus 500 mL (500 mLs Intravenous New Bag/Given 09/14/20 1708)    ED Course  I have reviewed the triage vital signs and the nursing notes.  Pertinent labs & imaging results that were available during my care of the patient were  reviewed by me and considered in my medical decision making (see chart for details).    MDM Rules/Calculators/A&P                          Elderly female with multiple medical problems presenting today with weakness and lethargy.  Daughter noted this started today as well as elevated sugar.  Patient appears pale on exam and daughter did report of black stool today.  Patient has been on aspirin and Plavix since her stenting in her lower extremities last week.  History of GI bleeding years ago from a stomach ulcer but nothing recently.  Patient stool on exam today is black and heme positive.  Concern for acute anemia.  She does have mild EKG changes today as well with T wave inversions anteriorly which were not present during last EKG.  Currently she has no evidence of respiratory distress.  Does not appear to be in any pain and daughter denies her complaining of pain recently. No focal symptoms to suggest stroke today. Concern for possible infection versus anemia versus an acute cardiac event. Given patient's mild changes in her EKG troponin, CBC, CMP, type and screen, chest x-ray are pending. Patient given a bolus of fluid.  6:41 PM Patient's hemoglobin today is  4.5 from her baseline of 8.  White count of 15,000 and platelet count is within normal limits.  CMP with creatinine that has almost doubled to 1.0 from baseline of 0.6 and elevated BUN of 37.  Patient does have an anion gap today of 19 and elevated troponin at 32.  Most likely related to strain but will need to be cycled.  Patient given a blood transfusion and Protonix.  Spoke with Dr. Doy Hutching who will plan on seeing her in the morning and request that she be n.p.o. after midnight.  On reevaluation daughter reports that patient is acting more herself currently.  Blood pressure remains in the low 100s and she did receive a 1 L bolus.  MDM Number of Diagnoses or Management Options   Amount and/or Complexity of Data Reviewed Clinical lab tests:  ordered and reviewed Tests in the radiology section of CPT: ordered and reviewed Tests in the medicine section of CPT: ordered and reviewed Decide to obtain previous medical records or to obtain history from someone other than the patient: yes Obtain history from someone other than the patient: yes Review and summarize past medical records: yes Discuss the patient with other providers: yes Independent visualization of images, tracings, or specimens: yes  Risk of Complications, Morbidity, and/or Mortality Presenting problems: high Diagnostic procedures: high Management options: high  Patient Progress Patient progress: stable  CRITICAL CARE Performed by: Armonie Mettler Total critical care time: 30 minutes Critical care time was exclusive of separately billable procedures and treating other patients. Critical care was necessary to treat or prevent imminent or life-threatening deterioration. Critical care was time spent personally by me on the following activities: development of treatment plan with patient and/or surrogate as well as nursing, discussions with consultants, evaluation of patient's response to treatment, examination of patient, obtaining history from patient or surrogate, ordering and performing treatments and interventions, ordering and review of laboratory studies, ordering and review of radiographic studies, pulse oximetry and re-evaluation of patient's condition.   Final Clinical Impression(s) / ED Diagnoses Final diagnoses:  Symptomatic anemia  Elevated troponin    Rx / DC Orders ED Discharge Orders    None       Blanchie Dessert, MD 09/14/20 1844

## 2020-09-14 NOTE — ED Triage Notes (Signed)
Pt arrive to ED by [facility] from home for c/o increase lethargic, unknown LSW by family. BP 115/55, HR 102, SPO2 97% RA.

## 2020-09-14 NOTE — ED Notes (Signed)
Unable to get IV access, IV team and phlebotomist consulted.

## 2020-09-14 NOTE — H&P (Signed)
History and Physical  Patient Name: Crystal Moore     VCB:[id number]    DOB: [birth year]    DOA: 09/14/2020 PCP: [name], MD  Patient coming from: Home  Chief Complaint: Generalized weakness, encephalopathic   HPI: Crystal Moore is a 75 y.o. female with medical history significant of diabetes, peripheral vascular disease, COPD, former  tobacco use disorder, history of CVA, OSA, dementia, gout, essential hypertension who presented to the hospital on 09/14/2020 secondary to feeling unwell, and was found to be severely anemic.  Patient is a poor historian, thus a complete and thorough HPI review of systems unable to be obtained. However patient's daughter bedside is able to provide history.  Patient was recently hospitalized for peripheral vascular disease, required left leg arteriogram and arterial stenting, and discharged on 09/06/2020 on aspirin and Plavix. Patient was doing well at home, where she lives with her daughter. However this morning, patient was noted to be more sluggish, tired, and not herself. Her daughter checked her blood sugar and it was higher than normal, in the 200s. Daughter had to go to work and had her brother check on her. He noted she was not appearing like her self, and when her daughter came back home, she called EMS due to her appearance. Of note patient did have a dark stool today. Prior she was having normal stool in color. She takes iron supplementation at home. There is report of having a upper GI ulcer diagnosed by EGD many years ago, but daughter unsure when.   ED course: -Vitals on initial presentation: Heart rate 93, respiratory rate 20, blood pressure 115/56, maintaining O2 sats on room air. -Labs on initial presentation: Sodium 137, potassium 4.9, chloride 103, bicarb 15, glucose 390, BUN 37, creatinine 1.04, calcium 8.8, albumin 2.3, WBC 15.7, hemoglobin 4.5, platelets 298, stool occult positive. -GI was contacted by the ED. Will evaluate in the  morning, recommend n.p.o. after midnight. -She was given IV fluids, Protonix, and 2 units of PRBCs ordered.     ROS: A complete and thorough review of systems unable to obtain due to patient status.      Past Medical History:  Diagnosis Date  . Arthritis   . COPD (chronic obstructive pulmonary disease) ([place])   . COVID-19 virus infection 07/2020  . CVA (cerebral vascular accident) ([place])   . Diabetes ([place])   . Diabetes mellitus without complication ([place])   . Hemiparesis affecting left side as late effect of cerebrovascular accident (CVA) ([place])   . Pyelonephritis   . Seizures ([place])   . Stroke ([place])   . Tobacco use   . Tobacco use disorder   . UTI (urinary tract infection)     Past Surgical History:  Procedure Laterality Date  . ABDOMINAL AORTOGRAM W/LOWER EXTREMITY N/A 08/30/2020   Procedure: ABDOMINAL AORTOGRAM W/LOWER EXTREMITY;  Surgeon: Cherre Robins, MD;  Location: [facility];  Service: Cardiovascular;  Laterality: N/A;  . ABDOMINAL AORTOGRAM W/LOWER EXTREMITY N/A 09/04/2020   Procedure: ABDOMINAL AORTOGRAM W/LOWER EXTREMITY;  Surgeon: Cherre Robins, MD;  Location: [facility];  Service: Cardiovascular;  Laterality: N/A;  . PERIPHERAL VASCULAR INTERVENTION Right 08/30/2020   Procedure: PERIPHERAL VASCULAR INTERVENTION;  Surgeon: Cherre Robins, MD;  Location: [facility];  Service: Cardiovascular;  Laterality: Right;  femoral popliteal  . PERIPHERAL VASCULAR INTERVENTION Left 09/04/2020   Procedure: PERIPHERAL VASCULAR INTERVENTION;  Surgeon: Cherre Robins, MD;  Location: [facility];  Service: Cardiovascular;  Laterality: Left;  SFA  . TUBAL LIGATION      Social History: Patient lives with daughter.  The patient walks without assistance. Former smoker.  No Known Allergies  Family history: family history includes Diabetes in her brother, mother, and sister.  Prior to Admission medications   Medication Sig Start Date End Date Taking?  Authorizing Provider  acetaminophen (TYLENOL) 325 MG tablet Take 2 tablets (650 mg total) by mouth every 4 (four) hours as needed for headache, mild pain, moderate pain or fever. 09/06/20   Hosie Poisson, MD  albuterol (PROVENTIL) (2.5 MG/3ML) 0.083% nebulizer solution Inhale 3 mLs (2.5 mg total) into the lungs every 4 (four) hours as needed for wheezing or shortness of breath. 01/17/19   Geradine Girt, DO  albuterol (VENTOLIN HFA) 108 (90 Base) MCG/ACT inhaler Inhale 1 puff into the lungs every 6 (six) hours as needed for wheezing or shortness of breath.    [provider]  amLODipine (NORVASC) 5 MG tablet Take 5 mg by mouth daily. 09/25/19   [provider]  aspirin 81 MG EC tablet Take by mouth daily. 11/07/15   [provider]  atorvastatin (LIPITOR) 80 MG tablet Take 1 tablet (80 mg total) by mouth daily at 6 PM. Patient taking differently: Take 80 mg by mouth daily. 12/21/18   Cristal Ford, DO  clopidogrel (PLAVIX) 75 MG tablet Take 1 tablet (75 mg total) by mouth daily. 09/06/20   Hosie Poisson, MD  escitalopram (LEXAPRO) 10 MG tablet Take 1 tablet (10 mg total) by mouth at bedtime. Patient taking differently: Take 10 mg by mouth daily. 11/07/19   Cameron Sprang, MD  feeding supplement (ENSURE ENLIVE / ENSURE PLUS) LIQD Take 237 mLs by mouth 2 (two) times daily between meals. Patient taking differently: Take 237 mLs by mouth daily. 06/28/20   Renato Shin, MD  ferrous sulfate 325 (65 FE) MG tablet Take 1 tablet (325 mg total) by mouth 2 (two) times daily with a meal. 09/06/20   Hosie Poisson, MD  gabapentin (NEURONTIN) 300 MG capsule Take 1 capsule (300 mg total) by mouth 3 (three) times daily. 09/06/20   Hosie Poisson, MD  glucose blood (ACCU-CHEK GUIDE) test strip 1 each by Other route in the morning and at bedtime. And lancets 2/day 05/21/20   Renato Shin, MD  insulin glargine (LANTUS) 100 UNIT/ML injection Inject 0.2 mLs (20 Units total) into the skin daily.  09/07/20   Hosie Poisson, MD  TRADJENTA 5 MG TABS tablet Take 5 mg by mouth daily. 10/24/19   [provider]       Physical Exam: BP 108/65   Pulse 81   Resp (!) 26   Ht 5\' 2"  (1.575 m)   Wt 60.1 kg   SpO2 95%   BMI 24.23 kg/m  General appearance: Elderlyadult female, alert and in no acute distress .   Eyes: Anicteric, conjunctiva pink, lids and lashes normal. PERRL.    ENT: No nasal deformity, discharge, epistaxis.  Hearing slightly poor. OP moist without lesions.   Neck: No neck masses.  Trachea midline.  No thyromegaly/tenderness. Lymph: No cervical or supraclavicular lymphadenopathy. Skin: Warm and dry.  No jaundice.  No suspicious rashes or lesions. Cardiac: RRR, nl S1-S2, no murmurs appreciated.  No LE edema.  Radial and pedal pulses 2+ and symmetric. Respiratory: Normal respiratory rate and rhythm.  CTAB without rales or wheezes. Abdomen: Abdomen soft.  Nontender, bowel sounds present. No ascites, distension, hepatosplenomegaly.   MSK: No deformities  or effusions of the large joints of the upper or lower extremities bilaterally. Neuro: Cranial nerves 2 through 12 grossly intact.  Sensation intact to light touch. Speech is fluent.   Psych: Sensorium intact and responding to questions, slightly lethargic.  Affect flat.     Labs on Admission:  I have personally reviewed following labs and imaging studies: CBC: Recent Labs  Lab 09/14/20 1730  WBC 15.7*  NEUTROABS 13.8*  HGB 4.5*  HCT 15.6*  MCV 91.8  PLT 939   Basic Metabolic Panel: Recent Labs  Lab 09/14/20 1730  NA 137  K 4.9  CL 103  CO2 15*  GLUCOSE 390*  BUN 37*  CREATININE 1.04*  CALCIUM 8.8*   GFR: Estimated Creatinine Clearance: 38.1 mL/min (A) (by C-G formula based on SCr of 1.04 mg/dL (H)).  Liver Function Tests: Recent Labs  Lab 09/14/20 1730  AST 26  ALT 19  ALKPHOS 60  BILITOT 0.6  PROT 6.3*  ALBUMIN 2.3*   No results for input(s): LIPASE, AMYLASE in the last 168  hours. No results for input(s): AMMONIA in the last 168 hours. Coagulation Profile: No results for input(s): INR, PROTIME in the last 168 hours. Cardiac Enzymes: No results for input(s): CKTOTAL, CKMB, CKMBINDEX, TROPONINI in the last 168 hours. BNP (last 3 results) No results for input(s): PROBNP in the last 8760 hours. HbA1C: No results for input(s): HGBA1C in the last 72 hours. CBG: Recent Labs  Lab 09/14/20 1648  GLUCAP 358*   Lipid Profile: No results for input(s): CHOL, HDL, LDLCALC, TRIG, CHOLHDL, LDLDIRECT in the last 72 hours. Thyroid Function Tests: No results for input(s): TSH, T4TOTAL, FREET4, T3FREE, THYROIDAB in the last 72 hours. Anemia Panel: No results for input(s): VITAMINB12, FOLATE, FERRITIN, TIBC, IRON, RETICCTPCT in the last 72 hours.   No results found for this or any previous visit (from the past 240 hour(s)).         Radiological Exams on Admission: Personally reviewed: Chest x-ray shows cardiomegaly, nonspecific interstitial lung markings.  DG Chest Port 1 View  Result Date: 09/14/2020 CLINICAL DATA:  Weakness.  Altered mental status. EXAM: PORTABLE CHEST 1 VIEW COMPARISON:  August 27, 2019 FINDINGS: There is persistent cardiomegaly. There are hazy bilateral airspace opacities and prominent interstitial lung markings. There is a focal airspace opacity projecting over the left lower lung zone and retrocardiac region. There is no pneumothorax. Aortic calcifications are again noted. There are old left-sided rib fractures. There is a stable calcified pulmonary nodule in the right mid lung zone. IMPRESSION: 1. Cardiomegaly with findings suggestive of mild interstitial edema. 2. Persistent airspace opacities bilaterally are nonspecific and may represent atelectasis or developing infiltrates in the appropriate clinical setting. Electronically Signed   By: Constance Holster M.D.   On: 09/14/2020 18:10      Assessment/Plan   1. Upper GI bleeding -Likely  secondary to gastric ulcer -Hemoglobin on admission 4.5. On 09/05/20, hemoglobin 7.9 -On 09/02/2020: Iron 22, TIBC 252, ferritin 18 -GI contacted by the ED, will evaluate in the morning. N.p.o. at midnight -Started on Protonix 40 mg IV Q12H -2 units of PRBCs ordered by ER, waiting to be administered. -Follow-up hemoglobin 2 hours after second unit of PRBCs finished -Serial CBC monitoring -Hold home aspirin and Plavix -Clear liquids diet for now, n.p.o. at midnight  2. Acute on chronic anemia -Acute component likely secondary to upper GI bleed -Over the past few months, hemoglobin has ranged from 8-10 -Recent iron studies consistent with iron deficiency anemia.  Likely had some ongoing slow GI bleeding that has been exacerbated by addition of Plavix -Likely would benefit from IV iron but will hold off for now given she is getting units of PRBCs -See further plans above  3. Hyperglycemia secondary to insulin-dependent type 2 diabetes -On arrival glucose 390. Likely elevated due to elevated cortisol given current state -Hemoglobin A1c 10.1 on 08/27/2020 -At home is on Lantus 20 units daily, no insulin with meals -Glucose checks and sliding scale q6H -We will continue Lantus daily, but tomorrow will only give 15 units due to being n.p.o.  4. Severe peripheral vascular disease -Hold home aspirin Plavix for now given GI bleed -Resume home statin once home meds reconciled  5. COPD -No signs of exacerbation at this time -Oxygen as needed -Breathing treatments as needed -Monitor closely for signs of volume overload as she is receiving units of PRBCs  6. Essential hypertension -Blood pressure within normal limits, but soft -Hold home Norvasc for now -Monitor blood pressure closely  7. Dementia --Delirium precautions --Fall precautions --Encourage a familiar face to remain present throughout the day --Keep blinds open and lights on during daylight hours --Minimize the use of  opioids/benzodiazepines    DVT prophylaxis: SCDs, no pharmacological prophylaxis given GI bleed Code Status: Full Family Communication: Discussed case with daughter bedside Disposition Plan: Anticipate discharge home when stable Consults called: GI called by ER provider Admission status: Progressive unit    Medical decision making: Patient seen at 7:53 PM on 09/14/2020.  The patient was discussed with ER provider.  What exists of the patient's chart was reviewed in depth and summarized above.  Clinical condition: Watcher.        Doran Heater [facility] Please page though [place] or Epic secure chat:  For password, contact charge nurse

## 2020-09-14 NOTE — ED Notes (Signed)
CBG 309. Nurse Notified.

## 2020-09-14 NOTE — ED Notes (Signed)
IV team at the bedside. 

## 2020-09-14 NOTE — ED Notes (Signed)
Daughter sitting on provider stool, family oriented that for her safety she will need to sit on the visitor chair on the room and because privacy she is not able to be in front of the computer, daughter became very defensive and verbally aggressive against this RN. Family oriented that there is not intention to make her feel uncomfortable and the reason of not sitting on that stool is for her safety since she can fall on this type of chair. Family very defensive with all staff taking care of the pt.

## 2020-09-15 DIAGNOSIS — K922 Gastrointestinal hemorrhage, unspecified: Secondary | ICD-10-CM | POA: Diagnosis not present

## 2020-09-15 DIAGNOSIS — I739 Peripheral vascular disease, unspecified: Secondary | ICD-10-CM | POA: Diagnosis not present

## 2020-09-15 DIAGNOSIS — J9611 Chronic respiratory failure with hypoxia: Secondary | ICD-10-CM | POA: Diagnosis not present

## 2020-09-15 DIAGNOSIS — E1159 Type 2 diabetes mellitus with other circulatory complications: Secondary | ICD-10-CM | POA: Diagnosis not present

## 2020-09-15 LAB — MAGNESIUM
Magnesium: 1.1 mg/dL — ABNORMAL LOW (ref 1.7–2.4)
Magnesium: 2.8 mg/dL — ABNORMAL HIGH (ref 1.7–2.4)

## 2020-09-15 LAB — CBC
HCT: 14.4 % — ABNORMAL LOW (ref 36.0–46.0)
HCT: 32.8 % — ABNORMAL LOW (ref 36.0–46.0)
Hemoglobin: 4.3 g/dL — CL (ref 12.0–15.0)
Hemoglobin: 10.6 g/dL — ABNORMAL LOW (ref 12.0–15.0)
MCH: 27 pg (ref 26.0–34.0)
MCH: 28.5 pg (ref 26.0–34.0)
MCHC: 29.9 g/dL — ABNORMAL LOW (ref 30.0–36.0)
MCHC: 32.3 g/dL (ref 30.0–36.0)
MCV: 90.6 fL (ref 80.0–100.0)
MCV: 88.2 fL (ref 80.0–100.0)
Platelets: 153 10*3/uL (ref 150–400)
Platelets: 252 10*3/uL (ref 150–400)
RBC: 1.59 MIL/uL — ABNORMAL LOW (ref 3.87–5.11)
RBC: 3.72 MIL/uL — ABNORMAL LOW (ref 3.87–5.11)
RDW: 18.7 % — ABNORMAL HIGH (ref 11.5–15.5)
RDW: 18.6 % — ABNORMAL HIGH (ref 11.5–15.5)
WBC: 8 10*3/uL (ref 4.0–10.5)
WBC: 14.7 10*3/uL — ABNORMAL HIGH (ref 4.0–10.5)
nRBC: 5.4 % — ABNORMAL HIGH (ref 0.0–0.2)
nRBC: 3.3 % — ABNORMAL HIGH (ref 0.0–0.2)

## 2020-09-15 LAB — POTASSIUM: Potassium: 4 mmol/L (ref 3.5–5.1)

## 2020-09-15 LAB — BASIC METABOLIC PANEL
Anion gap: 6 (ref 5–15)
BUN: 28 mg/dL — ABNORMAL HIGH (ref 8–23)
CO2: 22 mmol/L (ref 22–32)
Calcium: 8.4 mg/dL — ABNORMAL LOW (ref 8.9–10.3)
Chloride: 109 mmol/L (ref 98–111)
Creatinine, Ser: 0.76 mg/dL (ref 0.44–1.00)
GFR, Estimated: >60 mL/min (ref >60)
Glucose, Bld: 305 mg/dL — ABNORMAL HIGH (ref 70–99)
Potassium: 5.4 mmol/L — ABNORMAL HIGH (ref 3.5–5.1)
Sodium: 137 mmol/L (ref 135–145)

## 2020-09-15 LAB — COMPREHENSIVE METABOLIC PANEL
ALT: 12 U/L (ref 0–44)
AST: 12 U/L — ABNORMAL LOW (ref 15–41)
Albumin: 1.3 g/dL — ABNORMAL LOW (ref 3.5–5.0)
Alkaline Phosphatase: 34 U/L — ABNORMAL LOW (ref 38–126)
Anion gap: 7 (ref 5–15)
BUN: 27 mg/dL — ABNORMAL HIGH (ref 8–23)
CO2: 14 mmol/L — ABNORMAL LOW (ref 22–32)
Calcium: 5.4 mg/dL — CL (ref 8.9–10.3)
Chloride: 122 mmol/L — ABNORMAL HIGH (ref 98–111)
Creatinine, Ser: 0.49 mg/dL (ref 0.44–1.00)
GFR, Estimated: >60 mL/min (ref >60)
Glucose, Bld: 208 mg/dL — ABNORMAL HIGH (ref 70–99)
Potassium: 2.8 mmol/L — ABNORMAL LOW (ref 3.5–5.1)
Sodium: 143 mmol/L (ref 135–145)
Total Bilirubin: 0.2 mg/dL — ABNORMAL LOW (ref 0.3–1.2)
Total Protein: 3.6 g/dL — ABNORMAL LOW (ref 6.5–8.1)

## 2020-09-15 LAB — PHOSPHORUS
Phosphorus: 2 mg/dL — ABNORMAL LOW (ref 2.5–4.6)
Phosphorus: 3.7 mg/dL (ref 2.5–4.6)

## 2020-09-15 LAB — CBG MONITORING, ED
Glucose-Capillary: 260 mg/dL — ABNORMAL HIGH (ref 70–99)
Glucose-Capillary: 235 mg/dL — ABNORMAL HIGH (ref 70–99)
Glucose-Capillary: 238 mg/dL — ABNORMAL HIGH (ref 70–99)

## 2020-09-15 LAB — PROTIME-INR
INR: 1.7 — ABNORMAL HIGH (ref 0.8–1.2)
Prothrombin Time: 19 seconds — ABNORMAL HIGH (ref 11.4–15.2)

## 2020-09-15 LAB — APTT: aPTT: 32 seconds (ref 24–36)

## 2020-09-15 LAB — PREPARE RBC (CROSSMATCH)

## 2020-09-15 LAB — GLUCOSE, CAPILLARY: Glucose-Capillary: 192 mg/dL — ABNORMAL HIGH (ref 70–99)

## 2020-09-15 MED ORDER — VITAMIN K1 10 MG/ML IJ SOLN
10.0000 mg | Freq: Once | INTRAMUSCULAR | Status: AC
  Administered 2020-09-15: 10 mg via SUBCUTANEOUS
  Filled 2020-09-15 (×2): qty 1

## 2020-09-15 MED ORDER — POTASSIUM PHOSPHATES 15 MMOLE/5ML IV SOLN
20.0000 mmol | Freq: Once | INTRAVENOUS | Status: DC
  Administered 2020-09-15: 20 mmol via INTRAVENOUS
  Filled 2020-09-15: qty 6.67

## 2020-09-15 MED ORDER — SODIUM CHLORIDE 0.9% IV SOLUTION: Freq: Once | INTRAVENOUS | Status: AC

## 2020-09-15 MED ORDER — POTASSIUM CHLORIDE 10 MEQ/100ML IV SOLN
10.0000 meq | INTRAVENOUS | Status: AC
  Administered 2020-09-15 (×4): 10 meq via INTRAVENOUS
  Filled 2020-09-15 (×3): qty 100

## 2020-09-15 MED ORDER — MAGNESIUM SULFATE 4 GM/100ML IV SOLN
4.0000 g | Freq: Once | INTRAVENOUS | Status: AC
  Administered 2020-09-15: 4 g via INTRAVENOUS
  Filled 2020-09-15: qty 100

## 2020-09-15 MED ORDER — SODIUM CHLORIDE 0.9 % IV SOLN
8.0000 mg/h | INTRAVENOUS | Status: DC
  Administered 2020-09-15 – 2020-09-17 (×6): 8 mg/h via INTRAVENOUS
  Filled 2020-09-15 (×9): qty 80

## 2020-09-15 MED ORDER — PANTOPRAZOLE SODIUM 40 MG IV SOLR: 40.0000 mg | Freq: Two times a day (BID) | INTRAVENOUS | Status: DC

## 2020-09-15 NOTE — ED Notes (Signed)
Pt placed on 3L [place] for oxygen desaturation to 87%.

## 2020-09-15 NOTE — ED Notes (Signed)
Breakfast ordered 

## 2020-09-15 NOTE — Consult Note (Addendum)
Referring Provider:  [facility]         Primary Care Physician:  [name], MD Primary Gastroenterologist:   Althia Forts       We were asked to see this patient for:    GI bleed                 ASSESSMENT / PLAN:   # 75 yo female with profound anemia, possibly iron deficiency with black, heme+ stool on plavix and asa. Hemodynamically stable. On iron but it doesn't generally turn stools black.   --Hgb still in 4 range post 2 units of blood. Received a 3rd unit and waiting on 4th. Patient will need EGD when hgb at acceptable level and when electrolytes have been corrected. Keep NPO for now.  --On BID IV PPI. Not sure we can do an EGD today unless electrolytes are corrected so changing her to a PPI infusion --Patient has dementia. I called daughter [name] to discuss EGD . The risks and benefits of EGD were discussed and [name] agrees to have [place] proceed. Therapeutic options may be limited given that patient has been on Plavix until yesterday.   # Severe hypocalcemia, hypokalemia, hypophosphatemia, hypomagnesemia. Replacement per admitting team.   # Severe PVD, recent LLE stenting.   # Dementia  # Other medical problems as below.      Attending Physician Note   I have taken a history, examined the patient and reviewed the chart. I agree with the Advanced Practitioner's note, impression and recommendations.  Severe anemia with black, heme positive stool. Pt on Plavix, ASA. INR=1.7. R/O ulcer, AVMs, neoplasm, etc.  Transfuse to maintain Hgb > 7. Hold Plavix. Vit K. PPI IV bid for now. EGD planned for tomorrow with Dr. Bryan Lemma pending electrolyte correction and improved Hgb.   Bilateral popliteal and left superficial femoral artery stents placed on 2/4 and 2/9.   Multiple electrolyte abnormalities. Correcting per primary service.   Dementia, h/o CVA with left hemiparesis  COPD on 2 L 02  DM  Lucio Edward, MD [facility] [phone]    HPI:                                                                                                                              Chief Complaint: GI bleed  Crystal Moore is a 75 y.o. female with pmh of COVID, multiple CVA, PVD, dementia, diabetes, COPD, OSA  Patient's hgb was normal in June 2022. Subsequent labs in Epic on 07/27/20 showed a drop in hgb to 10.3. It declined further into the 8 range the beginning of this month and has continued to decline from there. She was admitted earlier this month with sepsis from right foot cellutlits, She has severe PVD of BLE and had critical limb ischemia requiring popliteal stenting on 08/30/20.   Patient present to ED yesterday with severe anemia. She is unable to provide history. Patient knows she is at the hospital but  unable to answer any other questions. She is hemodynamically stable. Hgb 4.5, she got 2 units of blood and repeat hgb was only 4.3. A 3rd unit of blood transfused and she is getting a 4th soon.  Mildly elevated BUN with mild elevation in creatinine as well. RN in room, she didn't get report of any GI bleeding on night shift and none this am.  Patient is on plavix and ASA at home. Chart review shows that Plavix is not new, it was on home meds list going several months back. However, based on H+P from admission earlier this month there is a question of whether patient was taking it however I spoke with daughter [name] and patient has definitely been on plavix for months if not years and also takes daily baby asa. Patient takes iron but per daughter it has never caused her stool to be black. Her BMs were noticed by daughter yesterday to be black. No NSAID use.      Past Medical History:  Diagnosis Date  . Arthritis   . COPD (chronic obstructive pulmonary disease) ([place])   . COVID-19 virus infection 07/2020  . CVA (cerebral vascular accident) ([place])   . Diabetes ([place])   . Diabetes mellitus without complication ([place])   . Hemiparesis affecting left side as  late effect of cerebrovascular accident (CVA) ([place])   . Pyelonephritis   . Seizures ([place])   . Stroke ([place])   . Tobacco use   . Tobacco use disorder   . UTI (urinary tract infection)     Past Surgical History:  Procedure Laterality Date  . ABDOMINAL AORTOGRAM W/LOWER EXTREMITY N/A 08/30/2020   Procedure: ABDOMINAL AORTOGRAM W/LOWER EXTREMITY;  Surgeon: Cherre Robins, MD;  Location: [facility];  Service: Cardiovascular;  Laterality: N/A;  . ABDOMINAL AORTOGRAM W/LOWER EXTREMITY N/A 09/04/2020   Procedure: ABDOMINAL AORTOGRAM W/LOWER EXTREMITY;  Surgeon: Cherre Robins, MD;  Location: [facility];  Service: Cardiovascular;  Laterality: N/A;  . PERIPHERAL VASCULAR INTERVENTION Right 08/30/2020   Procedure: PERIPHERAL VASCULAR INTERVENTION;  Surgeon: Cherre Robins, MD;  Location: [facility];  Service: Cardiovascular;  Laterality: Right;  femoral popliteal  . PERIPHERAL VASCULAR INTERVENTION Left 09/04/2020   Procedure: PERIPHERAL VASCULAR INTERVENTION;  Surgeon: Cherre Robins, MD;  Location: [facility];  Service: Cardiovascular;  Laterality: Left;  SFA  . TUBAL LIGATION      Prior to Admission medications   Medication Sig Start Date End Date Taking? Authorizing Provider  acetaminophen (TYLENOL) 500 MG tablet Take 1,000 mg by mouth every 6 (six) hours as needed (pain).   Yes [provider]  albuterol (PROVENTIL) (2.5 MG/3ML) 0.083% nebulizer solution Inhale 3 mLs (2.5 mg total) into the lungs every 4 (four) hours as needed for wheezing or shortness of breath. 01/17/19  Yes Vann, Jessica U, DO  albuterol (VENTOLIN HFA) 108 (90 Base) MCG/ACT inhaler Inhale 1 puff into the lungs every 6 (six) hours as needed for wheezing or shortness of breath.   Yes [provider]  amLODipine (NORVASC) 5 MG tablet Take 5 mg by mouth daily. 09/25/19  Yes [provider]  aspirin 81 MG EC tablet Take 81 mg by mouth daily. 11/07/15  Yes [provider]  atorvastatin (LIPITOR) 80 MG tablet Take 1 tablet (80 mg total) by mouth daily at 6 PM. Patient taking differently: Take 80 mg by mouth daily. 12/21/18  Yes Mikhail, Velta Addison, DO  clopidogrel (PLAVIX) 75 MG tablet Take  1 tablet (75 mg total) by mouth daily. 09/06/20  Yes Hosie Poisson, MD  feeding supplement (ENSURE ENLIVE / ENSURE PLUS) LIQD Take 237 mLs by mouth 2 (two) times daily between meals. 06/28/20  Yes Renato Shin, MD  ferrous sulfate 325 (65 FE) MG tablet Take 1 tablet (325 mg total) by mouth 2 (two) times daily with a meal. 09/06/20  Yes Hosie Poisson, MD  gabapentin (NEURONTIN) 300 MG capsule Take 1 capsule (300 mg total) by mouth 3 (three) times daily. 09/06/20  Yes Hosie Poisson, MD  insulin glargine (LANTUS) 100 UNIT/ML injection Inject 0.2 mLs (20 Units total) into the skin daily. 09/07/20  Yes Hosie Poisson, MD  linagliptin (TRADJENTA) 5 MG TABS tablet Take 5 mg by mouth daily.   Yes [provider]  acetaminophen (TYLENOL) 325 MG tablet Take 2 tablets (650 mg total) by mouth every 4 (four) hours as needed for headache, mild pain, moderate pain or fever. Patient not taking: No sig reported 09/06/20   Hosie Poisson, MD  glucose blood (ACCU-CHEK GUIDE) test strip 1 each by Other route in the morning and at bedtime. And lancets 2/day 05/21/20   Renato Shin, MD    Current Facility-Administered Medications  Medication Dose Route Frequency Provider Last Rate Last Admin  . acetaminophen (TYLENOL) tablet 650 mg  650 mg Oral Q6H PRN Doran Heater, DO       Or  . acetaminophen (TYLENOL) suppository 650 mg  650 mg Rectal Q6H PRN MacNeil, Richard G, DO      . albuterol (PROVENTIL) (2.5 MG/3ML) 0.083% nebulizer solution 2.5 mg  2.5 mg Nebulization Q2H PRN MacNeil, Richard G, DO      . atorvastatin (LIPITOR) tablet 80 mg  80 mg Oral q1800 MacNeil, Richard G, DO      . gabapentin (NEURONTIN) capsule 300 mg  300 mg Oral TID MacNeil, Richard G, DO      . insulin aspart (novoLOG)  injection 0-5 Units  0-5 Units Subcutaneous QHS Doran Heater, DO   3 Units at 09/14/20 2132  . insulin aspart (novoLOG) injection 0-9 Units  0-9 Units Subcutaneous TID WC MacNeil, Richard G, DO      . insulin glargine (LANTUS) injection 15 Units  15 Units Subcutaneous Daily MacNeil, Richard G, DO      . magnesium sulfate IVPB 4 g 100 mL  4 g Intravenous Once Caren Griffins, MD 50 mL/hr at 09/15/20 0809 4 g at 09/15/20 0809  . ondansetron (ZOFRAN) tablet 4 mg  4 mg Oral Q6H PRN Doran Heater, DO       Or  . ondansetron (ZOFRAN) injection 4 mg  4 mg Intravenous Q6H PRN MacNeil, Richard G, DO      . pantoprazole (PROTONIX) injection 40 mg  40 mg Intravenous Q12H Doran Heater, DO   40 mg at 09/15/20 6712   Current Outpatient Medications  Medication Sig Dispense Refill  . acetaminophen (TYLENOL) 500 MG tablet Take 1,000 mg by mouth every 6 (six) hours as needed (pain).    Marland Kitchen albuterol (PROVENTIL) (2.5 MG/3ML) 0.083% nebulizer solution Inhale 3 mLs (2.5 mg total) into the lungs every 4 (four) hours as needed for wheezing or shortness of breath. 75 mL 12  . albuterol (VENTOLIN HFA) 108 (90 Base) MCG/ACT inhaler Inhale 1 puff into the lungs every 6 (six) hours as needed for wheezing or shortness of breath.    Marland Kitchen amLODipine (NORVASC) 5 MG tablet Take 5 mg by mouth daily.    Marland Kitchen  aspirin 81 MG EC tablet Take 81 mg by mouth daily.    Marland Kitchen atorvastatin (LIPITOR) 80 MG tablet Take 1 tablet (80 mg total) by mouth daily at 6 PM. (Patient taking differently: Take 80 mg by mouth daily.) 30 tablet 0  . clopidogrel (PLAVIX) 75 MG tablet Take 1 tablet (75 mg total) by mouth daily. 30 tablet 2  . feeding supplement (ENSURE ENLIVE / ENSURE PLUS) LIQD Take 237 mLs by mouth 2 (two) times daily between meals. 237 mL 12  . ferrous sulfate 325 (65 FE) MG tablet Take 1 tablet (325 mg total) by mouth 2 (two) times daily with a meal. 30 tablet 3  . gabapentin (NEURONTIN) 300 MG capsule Take 1 capsule (300 mg  total) by mouth 3 (three) times daily. 30 capsule 3  . insulin glargine (LANTUS) 100 UNIT/ML injection Inject 0.2 mLs (20 Units total) into the skin daily. 10 mL 11  . linagliptin (TRADJENTA) 5 MG TABS tablet Take 5 mg by mouth daily.    Marland Kitchen acetaminophen (TYLENOL) 325 MG tablet Take 2 tablets (650 mg total) by mouth every 4 (four) hours as needed for headache, mild pain, moderate pain or fever. (Patient not taking: No sig reported)    . glucose blood (ACCU-CHEK GUIDE) test strip 1 each by Other route in the morning and at bedtime. And lancets 2/day 200 each 3    Allergies as of 09/14/2020  . (No Known Allergies)    Family History  Problem Relation Age of Onset  . Diabetes Mother   . Diabetes Sister   . Diabetes Brother     Social History   Socioeconomic History  . Marital status: Single    Spouse name: Not on file  . Number of children: Not on file  . Years of education: Not on file  . Highest education level: Not on file  Occupational History  . Not on file  Tobacco Use  . Smoking status: Current Every Day Smoker    Packs/day: 1.00    Years: 40.00    Pack years: 40.00    Types: Cigarettes  . Smokeless tobacco: Never Used  Vaping Use  . Vaping Use: Never used  Substance and Sexual Activity  . Alcohol use: Never  . Drug use: Never  . Sexual activity: Not on file  Other Topics Concern  . Not on file  Social History Narrative   ** Merged History Encounter **   Right handed    Lives with family        Social Determinants of Health   Financial Resource Strain: Not on file  Food Insecurity: Not on file  Transportation Needs: Not on file  Physical Activity: Not on file  Stress: Not on file  Social Connections: Not on file  Intimate Partner Violence: Not on file    Review of Systems: All systems reviewed and negative except where noted in HPI.    OBJECTIVE:    Physical Exam: Vital signs in last 24 hours: Temp:  [97.4 F (36.3 C)-98.4 F (36.9 C)] 98 F  (36.7 C) (02/20 0636) Pulse Rate:  [78-100] 79 (02/20 0700) Resp:  [11-32] 22 (02/20 0745) BP: (92-132)/(45-93) 119/52 (02/20 0745) SpO2:  [95 %-99 %] 97 % (02/20 0636) Weight:  [60.1 kg] 60.1 kg (02/19 1643)   General:   Alert  female in NAD Psych:  Pleasant, cooperative. . Eyes:  Pupils equal, sclera clear, no icterus.    Ears:  Normal auditory acuity. Nose:  No deformity, discharge,  or lesions. Neck:  Supple; no masses Lungs:  Clear throughout to auscultation.   No wheezes, crackles, or rhonchi.  Heart:  Regular rate and rhythm, 1+ BLE edema Abdomen:  Soft, non-distended, nontender, BS active, no palp mass   Rectal:  Liquid black stool in vault  Msk:  Symmetrical without gross deformities. . Neurologic:  Alert and oriented to place. Doesn't answer any other questions.    Filed Weights   09/14/20 1643  Weight: 60.1 kg     Scheduled inpatient medications . atorvastatin  80 mg Oral q1800  . gabapentin  300 mg Oral TID  . insulin aspart  0-5 Units Subcutaneous QHS  . insulin aspart  0-9 Units Subcutaneous TID WC  . insulin glargine  15 Units Subcutaneous Daily  . pantoprazole (PROTONIX) IV  40 mg Intravenous Q12H      Intake/Output from previous day: 02/19 0701 - 02/20 0700 In: 602.1 [Blood:325; IV Piggyback:277.1] Out: -  Intake/Output this shift: No intake/output data recorded.   Lab Results: Recent Labs    09/14/20 1730 09/15/20 0314  WBC 15.7* 8.0  HGB 4.5* 4.3*  HCT 15.6* 14.4*  PLT 298 153   BMET Recent Labs    09/14/20 1730 09/15/20 0314  NA 137 143  K 4.9 2.8*  CL 103 122*  CO2 15* 14*  GLUCOSE 390* 208*  BUN 37* 27*  CREATININE 1.04* 0.49  CALCIUM 8.8* 5.4*   LFT Recent Labs    09/15/20 0314  PROT 3.6*  ALBUMIN 1.3*  AST 12*  ALT 12  ALKPHOS 34*  BILITOT 0.2*   PT/INR Recent Labs    09/15/20 0314  LABPROT 19.0*  INR 1.7*   Hepatitis Panel No results for input(s): HEPBSAG, HCVAB, HEPAIGM, HEPBIGM in the last 72  hours.   . CBC Latest Ref Rng & Units 09/15/2020 09/14/2020 09/05/2020  WBC 4.0 - 10.5 K/uL 8.0 15.7(H) 13.1(H)  Hemoglobin 12.0 - 15.0 g/dL 4.3(LL) 4.5(LL) 7.9(L)  Hematocrit 36.0 - 46.0 % 14.4(L) 15.6(L) 25.4(L)  Platelets 150 - 400 K/uL 153 298 418(H)    . CMP Latest Ref Rng & Units 09/15/2020 09/14/2020 09/04/2020  Glucose 70 - 99 mg/dL 208(H) 390(H) 104(H)  BUN 8 - 23 mg/dL 27(H) 37(H) 10  Creatinine 0.44 - 1.00 mg/dL 0.49 1.04(H) 0.61  Sodium 135 - 145 mmol/L 143 137 136  Potassium 3.5 - 5.1 mmol/L 2.8(L) 4.9 4.0  Chloride 98 - 111 mmol/L 122(H) 103 101  CO2 22 - 32 mmol/L 14(L) 15(L) 26  Calcium 8.9 - 10.3 mg/dL 5.4(LL) 8.8(L) 9.1  Total Protein 6.5 - 8.1 g/dL 3.6(L) 6.3(L) -  Total Bilirubin 0.3 - 1.2 mg/dL 0.2(L) 0.6 -  Alkaline Phos 38 - 126 U/L 34(L) 60 -  AST 15 - 41 U/L 12(L) 26 -  ALT 0 - 44 U/L 12 19 -   Studies/Results: DG Chest Port 1 View  Result Date: 09/14/2020 CLINICAL DATA:  Weakness.  Altered mental status. EXAM: PORTABLE CHEST 1 VIEW COMPARISON:  August 27, 2019 FINDINGS: There is persistent cardiomegaly. There are hazy bilateral airspace opacities and prominent interstitial lung markings. There is a focal airspace opacity projecting over the left lower lung zone and retrocardiac region. There is no pneumothorax. Aortic calcifications are again noted. There are old left-sided rib fractures. There is a stable calcified pulmonary nodule in the right mid lung zone. IMPRESSION: 1. Cardiomegaly with findings suggestive of mild interstitial edema. 2. Persistent airspace opacities bilaterally are nonspecific and may represent atelectasis or developing  infiltrates in the appropriate clinical setting. Electronically Signed   By: Constance Holster M.D.   On: 09/14/2020 18:10    Active Problems:   Chronic respiratory failure with hypoxia (HCC)   Diabetes (HCC)   Acute upper GI bleed   Acute on chronic anemia   Peripheral arterial disease with history of revascularization  (HCC)    Tye Savoy, NP-C @  09/15/2020, 8:45 AM

## 2020-09-15 NOTE — ED Notes (Signed)
MD made aware of pt critical hbg and calcium via amion.

## 2020-09-15 NOTE — Progress Notes (Signed)
PROGRESS NOTE  Crystal Moore OVF:[id number] DOB: 1945-08-27 DOA: 09/14/2020 PCP: [name], MD   LOS: 1 day   Brief Narrative / Interim history: Crystal Moore is a 75 y.o. femalewith medical history significant ofdiabetes, peripheral vascular disease, COPD, former  tobacco use disorder, history of CVA, OSA, dementia, gout, essential hypertension who presented to the hospital on 09/14/2020 secondary to feeling unwell, and was found to be severely anemic.  Family reported patient having black stools.  She recently underwent left leg arteriogram and arterial stenting for PVD, discharged on 09/06/2020 on aspirin and Plavix.  Subjective / 24h Interval events: Underlying dementia, is confused, but alert.  No complaints  Assessment & Plan: Principal Problem Severe acute blood loss anemia due to probable upper GI bleeding -GI consulted, will make patient n.p.o. this morning.  Given reported dark stools probably upper GI source. -Hemoglobin 4.3 on admission, is getting her 2nd unit right now, monitor closely hemoglobin -Continue iv PPI  Active Problems Severe PVD -S/p right SFA popliteal stenting by vascular surgery on 08/30/20  Hypokalemia, hypomagnesemia, hypophosphatemia -Possibly due to poor p.o. intake, replete aggressively and monitor  COPD/ILD -On chronic 2 L oxygen at home.  Appears stable, no wheezing  Essential hypertension -Hold blood pressure medications due to active GI bleed  Hyperlipidemia -Continue atorvastatin  Dementia -Delirium precautions -Fall precautions -Encourage a familiar face to remain present throughout the day -Keep blinds open and lights on during daylight hours -Minimize the use of opioids/benzodiazepines  Type 2 diabetes mellitus, with hyperglycemia, poorly controlled -A1c 10.1 just couple of weeks ago, continue Lantus, sliding scale, monitor CBGs closely  CBG (last 3)  Recent Labs    09/14/20 2128 09/14/20 2345 09/15/20 0623   GLUCAP 293* 304* 260*    Scheduled Meds: . atorvastatin  80 mg Oral q1800  . gabapentin  300 mg Oral TID  . insulin aspart  0-5 Units Subcutaneous QHS  . insulin aspart  0-9 Units Subcutaneous TID WC  . insulin glargine  15 Units Subcutaneous Daily  . pantoprazole (PROTONIX) IV  40 mg Intravenous Q12H   Continuous Infusions: . magnesium sulfate bolus IVPB 4 g (09/15/20 0809)   PRN Meds:.acetaminophen **OR** acetaminophen, albuterol, ondansetron **OR** ondansetron (ZOFRAN) IV  Diet Orders (From admission, onward)    Start     Ordered   09/15/20 0653  Diet NPO time specified  Diet effective now        09/15/20 8416          DVT prophylaxis: SCDs Start: 09/14/20 1946     Code Status: Full Code  Family Communication: no family at bedside   Status is: Inpatient  Remains inpatient appropriate because:Inpatient level of care appropriate due to severity of illness  Dispo: The patient is from: Home              Anticipated d/c is to: Home              Anticipated d/c date is: 3 days              Patient currently is not medically stable to d/c.   Difficult to place patient No   Level of care: Progressive  Consultants:  GI  Procedures:  None   Microbiology  None   Antimicrobials: None     Objective: Vitals:   09/15/20 0636 09/15/20 0700 09/15/20 0745 09/15/20 0845  BP: (!) 118/45 (!) 112/48 (!) 119/52 (!) 130/55  Pulse: 82 79    Resp: 16 13 (!)  22 17  Temp: 98 F (36.7 C)     TempSrc: Oral     SpO2: 97%     Weight:      Height:        Intake/Output Summary (Last 24 hours) at 09/15/2020 0853 Last data filed at 09/14/2020 2307 Gross per 24 hour  Intake 602.09 ml  Output --  Net 602.09 ml   Filed Weights   09/14/20 1643  Weight: 60.1 kg    Examination:  Constitutional: NAD Eyes: no scleral icterus ENMT: Mucous membranes are moist.  Neck: normal, supple Respiratory: clear to auscultation bilaterally, no wheezing, no crackles. Normal  respiratory effort. Cardiovascular: Regular rate and rhythm, no murmurs / rubs / gallops.  Trace edema Abdomen: non distended, no tenderness. Bowel sounds positive.  Musculoskeletal: no clubbing / cyanosis.  Skin: no rashes Neurologic: No focal deficits  Data Reviewed: I have independently reviewed following labs and imaging studies   CBC: Recent Labs  Lab 09/14/20 1730 09/15/20 0314  WBC 15.7* 8.0  NEUTROABS 13.8*  --   HGB 4.5* 4.3*  HCT 15.6* 14.4*  MCV 91.8 90.6  PLT 298 637   Basic Metabolic Panel: Recent Labs  Lab 09/14/20 1730 09/15/20 0314  NA 137 143  K 4.9 2.8*  CL 103 122*  CO2 15* 14*  GLUCOSE 390* 208*  BUN 37* 27*  CREATININE 1.04* 0.49  CALCIUM 8.8* 5.4*  MG  --  1.1*  PHOS  --  2.0*   Liver Function Tests: Recent Labs  Lab 09/14/20 1730 09/15/20 0314  AST 26 12*  ALT 19 12  ALKPHOS 60 34*  BILITOT 0.6 0.2*  PROT 6.3* 3.6*  ALBUMIN 2.3* 1.3*   Coagulation Profile: Recent Labs  Lab 09/15/20 0314  INR 1.7*   HbA1C: No results for input(s): HGBA1C in the last 72 hours. CBG: Recent Labs  Lab 09/14/20 1648 09/14/20 2128 09/14/20 2345 09/15/20 0623  GLUCAP 358* 293* 304* 260*    No results found for this or any previous visit (from the past 240 hour(s)).   Radiology Studies: DG Chest Port 1 View  Result Date: 09/14/2020 CLINICAL DATA:  Weakness.  Altered mental status. EXAM: PORTABLE CHEST 1 VIEW COMPARISON:  August 27, 2019 FINDINGS: There is persistent cardiomegaly. There are hazy bilateral airspace opacities and prominent interstitial lung markings. There is a focal airspace opacity projecting over the left lower lung zone and retrocardiac region. There is no pneumothorax. Aortic calcifications are again noted. There are old left-sided rib fractures. There is a stable calcified pulmonary nodule in the right mid lung zone. IMPRESSION: 1. Cardiomegaly with findings suggestive of mild interstitial edema. 2. Persistent airspace  opacities bilaterally are nonspecific and may represent atelectasis or developing infiltrates in the appropriate clinical setting. Electronically Signed   By: Constance Holster M.D.   On: 09/14/2020 18:10    Marzetta Board, MD, PhD [facility]  Between 7 am - 7 pm I am available, please contact me via Amion or Securechat  Between 7 pm - 7 am I am not available, please contact night coverage MD/APP via Amion

## 2020-09-15 NOTE — Consult Note (Signed)
WOC Nurse Consult Note: Reason for Consult: Bilateral foot wounds, chronic, healing. Wound type:Mixed etiology, arterial insufficiency addressed during last admission with vascular intervention. Pressure Injury POA: N/A Measurement:to be obtained by bedside RN prior to placement of first ordered dressing today Wound bed:  Left lateral malleolar wound is full thickness, red, moist. Right anterior foot with evidence of previous wound healing and small area that continues as full thickness with red, moist wound bed. Drainage (amount, consistency, odor) small serous Periwound: intact, dry Dressing procedure/placement/frequency: Conservative care orders are provided for Nursing while patient is in house using daily cleanse and placement of antimicrobial nonadherent, xeroform. Pressure injury prevention i.e., sacral prophylactic foam and bilateral pressure redistribution heel boots are requested. Turning and repositioning per house protocol is in place.  [place] nursing team will not follow, but will remain available to this patient, the nursing and medical teams.  Please re-consult if needed. Thanks, Maudie Flakes, MSN, RN, [place], Arther Abbott  Pager# [phone]

## 2020-09-15 NOTE — H&P (View-Only) (Signed)
Referring Provider:  [facility]         Primary Care Physician:  Crystal Cha, MD Primary Gastroenterologist:   Crystal Moore       We were asked to see this patient for:    GI bleed                 ASSESSMENT / PLAN:   # 75 yo female with profound anemia, possibly iron deficiency with black, heme+ stool on plavix and asa. Hemodynamically stable. On iron but it doesn't generally turn stools black.   --Hgb still in 4 range post 2 units of blood. Received a 3rd unit and waiting on 4th. Patient will need EGD when hgb at acceptable level and when electrolytes have been corrected. Keep NPO for now.  --On BID IV PPI. Not sure we can do an EGD today unless electrolytes are corrected so changing her to a PPI infusion --Patient has dementia. I called daughter Crystal Moore to discuss EGD . The risks and benefits of EGD were discussed and Crystal Moore agrees to have [place] proceed. Therapeutic options may be limited given that patient has been on Plavix until yesterday.   # Severe hypocalcemia, hypokalemia, hypophosphatemia, hypomagnesemia. Replacement per admitting team.   # Severe PVD, recent LLE stenting.   # Dementia  # Other medical problems as below.      Attending Physician Note   I have taken a history, examined the patient and reviewed the chart. I agree with the Advanced Practitioner's note, impression and recommendations.  Severe anemia with black, heme positive stool. Pt on Plavix, ASA. INR=1.7. R/O ulcer, AVMs, neoplasm, etc.  Transfuse to maintain Hgb > 7. Hold Plavix. Vit K. PPI IV bid for now. EGD planned for tomorrow with Dr. Bryan Moore pending electrolyte correction and improved Hgb.   Bilateral popliteal and left superficial femoral artery stents placed on 2/4 and 2/9.   Multiple electrolyte abnormalities. Correcting per primary service.   Dementia, h/o CVA with left hemiparesis  COPD on 2 L 02  DM  Lucio Edward, MD [facility] [phone]    HPI:                                                                                                                              Chief Complaint: GI bleed  Crystal Moore is a 75 y.o. female with pmh of COVID, multiple CVA, PVD, dementia, diabetes, COPD, OSA  Patient's hgb was normal in June 2022. Subsequent labs in Epic on 07/27/20 showed a drop in hgb to 10.3. It declined further into the 8 range the beginning of this month and has continued to decline from there. She was admitted earlier this month with sepsis from right foot cellutlits, She has severe PVD of BLE and had critical limb ischemia requiring popliteal stenting on 08/30/20.   Patient present to ED yesterday with severe anemia. She is unable to provide history. Patient knows she is at the hospital but  unable to answer any other questions. She is hemodynamically stable. Hgb 4.5, she got 2 units of blood and repeat hgb was only 4.3. A 3rd unit of blood transfused and she is getting a 4th soon.  Mildly elevated BUN with mild elevation in creatinine as well. RN in room, she didn't get report of any GI bleeding on night shift and none this am.  Patient is on plavix and ASA at home. Chart review shows that Plavix is not new, it was on home meds list going several months back. However, based on H+P from admission earlier this month there is a question of whether patient was taking it however I spoke with daughter Crystal Moore and patient has definitely been on plavix for months if not years and also takes daily baby asa. Patient takes iron but per daughter it has never caused her stool to be black. Her BMs were noticed by daughter yesterday to be black. No NSAID use.      Past Medical History:  Diagnosis Date  . Arthritis   . COPD (chronic obstructive pulmonary disease) ([place])   . COVID-19 virus infection 07/2020  . CVA (cerebral vascular accident) ([place])   . Diabetes ([place])   . Diabetes mellitus without complication ([place])   . Hemiparesis affecting left side as  late effect of cerebrovascular accident (CVA) ([place])   . Pyelonephritis   . Seizures ([place])   . Stroke ([place])   . Tobacco use   . Tobacco use disorder   . UTI (urinary tract infection)     Past Surgical History:  Procedure Laterality Date  . ABDOMINAL AORTOGRAM W/LOWER EXTREMITY N/A 08/30/2020   Procedure: ABDOMINAL AORTOGRAM W/LOWER EXTREMITY;  Surgeon: Cherre Robins, MD;  Location: [place] [facility];  Service: Cardiovascular;  Laterality: N/A;  . ABDOMINAL AORTOGRAM W/LOWER EXTREMITY N/A 09/04/2020   Procedure: ABDOMINAL AORTOGRAM W/LOWER EXTREMITY;  Surgeon: Cherre Robins, MD;  Location: [place] [facility];  Service: Cardiovascular;  Laterality: N/A;  . PERIPHERAL VASCULAR INTERVENTION Right 08/30/2020   Procedure: PERIPHERAL VASCULAR INTERVENTION;  Surgeon: Cherre Robins, MD;  Location: [place] [facility];  Service: Cardiovascular;  Laterality: Right;  femoral popliteal  . PERIPHERAL VASCULAR INTERVENTION Left 09/04/2020   Procedure: PERIPHERAL VASCULAR INTERVENTION;  Surgeon: Cherre Robins, MD;  Location: [place] [facility];  Service: Cardiovascular;  Laterality: Left;  SFA  . TUBAL LIGATION      Prior to Admission medications   Medication Sig Start Date End Date Taking? Authorizing Provider  acetaminophen (TYLENOL) 500 MG tablet Take 1,000 mg by mouth every 6 (six) hours as needed (pain).   Yes [provider]  albuterol (PROVENTIL) (2.5 MG/3ML) 0.083% nebulizer solution Inhale 3 mLs (2.5 mg total) into the lungs every 4 (four) hours as needed for wheezing or shortness of breath. 01/17/19  Yes Vann, Jessica U, DO  albuterol (VENTOLIN HFA) 108 (90 Base) MCG/ACT inhaler Inhale 1 puff into the lungs every 6 (six) hours as needed for wheezing or shortness of breath.   Yes [provider]  amLODipine (NORVASC) 5 MG tablet Take 5 mg by mouth daily. 09/25/19  Yes [provider]  aspirin 81 MG EC tablet Take 81 mg by mouth daily. 11/07/15  Yes [provider]  atorvastatin (LIPITOR) 80 MG tablet Take 1 tablet (80 mg total) by mouth daily at 6 PM. Patient taking differently: Take 80 mg by mouth daily. 12/21/18  Yes Mikhail, Velta Addison, DO  clopidogrel (PLAVIX) 75 MG tablet Take  1 tablet (75 mg total) by mouth daily. 09/06/20  Yes Hosie Poisson, MD  feeding supplement (ENSURE ENLIVE / ENSURE PLUS) LIQD Take 237 mLs by mouth 2 (two) times daily between meals. 06/28/20  Yes Renato Shin, MD  ferrous sulfate 325 (65 FE) MG tablet Take 1 tablet (325 mg total) by mouth 2 (two) times daily with a meal. 09/06/20  Yes Hosie Poisson, MD  gabapentin (NEURONTIN) 300 MG capsule Take 1 capsule (300 mg total) by mouth 3 (three) times daily. 09/06/20  Yes Hosie Poisson, MD  insulin glargine (LANTUS) 100 UNIT/ML injection Inject 0.2 mLs (20 Units total) into the skin daily. 09/07/20  Yes Hosie Poisson, MD  linagliptin (TRADJENTA) 5 MG TABS tablet Take 5 mg by mouth daily.   Yes [provider]  acetaminophen (TYLENOL) 325 MG tablet Take 2 tablets (650 mg total) by mouth every 4 (four) hours as needed for headache, mild pain, moderate pain or fever. Patient not taking: No sig reported 09/06/20   Hosie Poisson, MD  glucose blood (ACCU-CHEK GUIDE) test strip 1 each by Other route in the morning and at bedtime. And lancets 2/day 05/21/20   Renato Shin, MD    Current Facility-Administered Medications  Medication Dose Route Frequency Provider Last Rate Last Admin  . acetaminophen (TYLENOL) tablet 650 mg  650 mg Oral Q6H PRN Doran Heater, DO       Or  . acetaminophen (TYLENOL) suppository 650 mg  650 mg Rectal Q6H PRN MacNeil, Richard G, DO      . albuterol (PROVENTIL) (2.5 MG/3ML) 0.083% nebulizer solution 2.5 mg  2.5 mg Nebulization Q2H PRN MacNeil, Richard G, DO      . atorvastatin (LIPITOR) tablet 80 mg  80 mg Oral q1800 MacNeil, Richard G, DO      . gabapentin (NEURONTIN) capsule 300 mg  300 mg Oral TID MacNeil, Richard G, DO      . insulin aspart (novoLOG)  injection 0-5 Units  0-5 Units Subcutaneous QHS Doran Heater, DO   3 Units at 09/14/20 2132  . insulin aspart (novoLOG) injection 0-9 Units  0-9 Units Subcutaneous TID WC MacNeil, Richard G, DO      . insulin glargine (LANTUS) injection 15 Units  15 Units Subcutaneous Daily MacNeil, Richard G, DO      . magnesium sulfate IVPB 4 g 100 mL  4 g Intravenous Once Caren Griffins, MD 50 mL/hr at 09/15/20 0809 4 g at 09/15/20 0809  . ondansetron (ZOFRAN) tablet 4 mg  4 mg Oral Q6H PRN Doran Heater, DO       Or  . ondansetron (ZOFRAN) injection 4 mg  4 mg Intravenous Q6H PRN MacNeil, Richard G, DO      . pantoprazole (PROTONIX) injection 40 mg  40 mg Intravenous Q12H Doran Heater, DO   40 mg at 09/15/20 6270   Current Outpatient Medications  Medication Sig Dispense Refill  . acetaminophen (TYLENOL) 500 MG tablet Take 1,000 mg by mouth every 6 (six) hours as needed (pain).    Marland Kitchen albuterol (PROVENTIL) (2.5 MG/3ML) 0.083% nebulizer solution Inhale 3 mLs (2.5 mg total) into the lungs every 4 (four) hours as needed for wheezing or shortness of breath. 75 mL 12  . albuterol (VENTOLIN HFA) 108 (90 Base) MCG/ACT inhaler Inhale 1 puff into the lungs every 6 (six) hours as needed for wheezing or shortness of breath.    Marland Kitchen amLODipine (NORVASC) 5 MG tablet Take 5 mg by mouth daily.    Marland Kitchen  aspirin 81 MG EC tablet Take 81 mg by mouth daily.    Marland Kitchen atorvastatin (LIPITOR) 80 MG tablet Take 1 tablet (80 mg total) by mouth daily at 6 PM. (Patient taking differently: Take 80 mg by mouth daily.) 30 tablet 0  . clopidogrel (PLAVIX) 75 MG tablet Take 1 tablet (75 mg total) by mouth daily. 30 tablet 2  . feeding supplement (ENSURE ENLIVE / ENSURE PLUS) LIQD Take 237 mLs by mouth 2 (two) times daily between meals. 237 mL 12  . ferrous sulfate 325 (65 FE) MG tablet Take 1 tablet (325 mg total) by mouth 2 (two) times daily with a meal. 30 tablet 3  . gabapentin (NEURONTIN) 300 MG capsule Take 1 capsule (300 mg  total) by mouth 3 (three) times daily. 30 capsule 3  . insulin glargine (LANTUS) 100 UNIT/ML injection Inject 0.2 mLs (20 Units total) into the skin daily. 10 mL 11  . linagliptin (TRADJENTA) 5 MG TABS tablet Take 5 mg by mouth daily.    Marland Kitchen acetaminophen (TYLENOL) 325 MG tablet Take 2 tablets (650 mg total) by mouth every 4 (four) hours as needed for headache, mild pain, moderate pain or fever. (Patient not taking: No sig reported)    . glucose blood (ACCU-CHEK GUIDE) test strip 1 each by Other route in the morning and at bedtime. And lancets 2/day 200 each 3    Allergies as of 09/14/2020  . (No Known Allergies)    Family History  Problem Relation Age of Onset  . Diabetes Mother   . Diabetes Sister   . Diabetes Brother     Social History   Socioeconomic History  . Marital status: Single    Spouse name: Not on file  . Number of children: Not on file  . Years of education: Not on file  . Highest education level: Not on file  Occupational History  . Not on file  Tobacco Use  . Smoking status: Current Every Day Smoker    Packs/day: 1.00    Years: 40.00    Pack years: 40.00    Types: Cigarettes  . Smokeless tobacco: Never Used  Vaping Use  . Vaping Use: Never used  Substance and Sexual Activity  . Alcohol use: Never  . Drug use: Never  . Sexual activity: Not on file  Other Topics Concern  . Not on file  Social History Narrative   ** Merged History Encounter **   Right handed    Lives with family        Social Determinants of Health   Financial Resource Strain: Not on file  Food Insecurity: Not on file  Transportation Needs: Not on file  Physical Activity: Not on file  Stress: Not on file  Social Connections: Not on file  Intimate Partner Violence: Not on file    Review of Systems: All systems reviewed and negative except where noted in HPI.    OBJECTIVE:    Physical Exam: Vital signs in last 24 hours: Temp:  [97.4 F (36.3 C)-98.4 F (36.9 C)] 98 F  (36.7 C) (02/20 0636) Pulse Rate:  [78-100] 79 (02/20 0700) Resp:  [11-32] 22 (02/20 0745) BP: (92-132)/(45-93) 119/52 (02/20 0745) SpO2:  [95 %-99 %] 97 % (02/20 0636) Weight:  [60.1 kg] 60.1 kg (02/19 1643)   General:   Alert  female in NAD Psych:  Pleasant, cooperative. . Eyes:  Pupils equal, sclera clear, no icterus.    Ears:  Normal auditory acuity. Nose:  No deformity, discharge,  or lesions. Neck:  Supple; no masses Lungs:  Clear throughout to auscultation.   No wheezes, crackles, or rhonchi.  Heart:  Regular rate and rhythm, 1+ BLE edema Abdomen:  Soft, non-distended, nontender, BS active, no palp mass   Rectal:  Liquid black stool in vault  Msk:  Symmetrical without gross deformities. . Neurologic:  Alert and oriented to place. Doesn't answer any other questions.    Filed Weights   09/14/20 1643  Weight: 60.1 kg     Scheduled inpatient medications . atorvastatin  80 mg Oral q1800  . gabapentin  300 mg Oral TID  . insulin aspart  0-5 Units Subcutaneous QHS  . insulin aspart  0-9 Units Subcutaneous TID WC  . insulin glargine  15 Units Subcutaneous Daily  . pantoprazole (PROTONIX) IV  40 mg Intravenous Q12H      Intake/Output from previous day: 02/19 0701 - 02/20 0700 In: 602.1 [Blood:325; IV Piggyback:277.1] Out: -  Intake/Output this shift: No intake/output data recorded.   Lab Results: Recent Labs    09/14/20 1730 09/15/20 0314  WBC 15.7* 8.0  HGB 4.5* 4.3*  HCT 15.6* 14.4*  PLT 298 153   BMET Recent Labs    09/14/20 1730 09/15/20 0314  NA 137 143  K 4.9 2.8*  CL 103 122*  CO2 15* 14*  GLUCOSE 390* 208*  BUN 37* 27*  CREATININE 1.04* 0.49  CALCIUM 8.8* 5.4*   LFT Recent Labs    09/15/20 0314  PROT 3.6*  ALBUMIN 1.3*  AST 12*  ALT 12  ALKPHOS 34*  BILITOT 0.2*   PT/INR Recent Labs    09/15/20 0314  LABPROT 19.0*  INR 1.7*   Hepatitis Panel No results for input(s): HEPBSAG, HCVAB, HEPAIGM, HEPBIGM in the last 72  hours.   . CBC Latest Ref Rng & Units 09/15/2020 09/14/2020 09/05/2020  WBC 4.0 - 10.5 K/uL 8.0 15.7(H) 13.1(H)  Hemoglobin 12.0 - 15.0 g/dL 4.3(LL) 4.5(LL) 7.9(L)  Hematocrit 36.0 - 46.0 % 14.4(L) 15.6(L) 25.4(L)  Platelets 150 - 400 K/uL 153 298 418(H)    . CMP Latest Ref Rng & Units 09/15/2020 09/14/2020 09/04/2020  Glucose 70 - 99 mg/dL 208(H) 390(H) 104(H)  BUN 8 - 23 mg/dL 27(H) 37(H) 10  Creatinine 0.44 - 1.00 mg/dL 0.49 1.04(H) 0.61  Sodium 135 - 145 mmol/L 143 137 136  Potassium 3.5 - 5.1 mmol/L 2.8(L) 4.9 4.0  Chloride 98 - 111 mmol/L 122(H) 103 101  CO2 22 - 32 mmol/L 14(L) 15(L) 26  Calcium 8.9 - 10.3 mg/dL 5.4(LL) 8.8(L) 9.1  Total Protein 6.5 - 8.1 g/dL 3.6(L) 6.3(L) -  Total Bilirubin 0.3 - 1.2 mg/dL 0.2(L) 0.6 -  Alkaline Phos 38 - 126 U/L 34(L) 60 -  AST 15 - 41 U/L 12(L) 26 -  ALT 0 - 44 U/L 12 19 -   Studies/Results: DG Chest Port 1 View  Result Date: 09/14/2020 CLINICAL DATA:  Weakness.  Altered mental status. EXAM: PORTABLE CHEST 1 VIEW COMPARISON:  August 27, 2019 FINDINGS: There is persistent cardiomegaly. There are hazy bilateral airspace opacities and prominent interstitial lung markings. There is a focal airspace opacity projecting over the left lower lung zone and retrocardiac region. There is no pneumothorax. Aortic calcifications are again noted. There are old left-sided rib fractures. There is a stable calcified pulmonary nodule in the right mid lung zone. IMPRESSION: 1. Cardiomegaly with findings suggestive of mild interstitial edema. 2. Persistent airspace opacities bilaterally are nonspecific and may represent atelectasis or developing  infiltrates in the appropriate clinical setting. Electronically Signed   By: Constance Holster M.D.   On: 09/14/2020 18:10    Active Problems:   Chronic respiratory failure with hypoxia (HCC)   Diabetes (HCC)   Acute upper GI bleed   Acute on chronic anemia   Peripheral arterial disease with history of revascularization  (HCC)    Tye Savoy, NP-C @  09/15/2020, 8:45 AM

## 2020-09-15 NOTE — Progress Notes (Signed)
Pt admitted to [place] from [facility] ED.  Pt is A & O to self and place only. Pt rarely makes verbal responses, but inconsistently can nod head appropriately.   Pt is neuro intact.  Pt vitals taken and all within normal range, with the exception of BP: 146/72.  Pt on [place] at 3L O2 saturation at 97%. CHG bath completed.  Pt placed on telemetry and CCMD notified. Pt is currently comfortable and not in pain.

## 2020-09-16 ENCOUNTER — Inpatient Hospital Stay (HOSPITAL_COMMUNITY): Payer: Medicare Other | Admitting: Certified Registered Nurse Anesthetist

## 2020-09-16 ENCOUNTER — Encounter (HOSPITAL_COMMUNITY)
Admission: EM | Disposition: A | Discharge status: Home Health | Payer: Self-pay | Source: Home / Self Care | Attending: Internal Medicine

## 2020-09-16 ENCOUNTER — Encounter (HOSPITAL_BASED_OUTPATIENT_CLINIC_OR_DEPARTMENT_OTHER): Payer: Medicare Other | Admitting: Internal Medicine

## 2020-09-16 ENCOUNTER — Encounter (HOSPITAL_COMMUNITY): Payer: Self-pay | Admitting: Internal Medicine

## 2020-09-16 DIAGNOSIS — K31819 Angiodysplasia of stomach and duodenum without bleeding: Secondary | ICD-10-CM

## 2020-09-16 DIAGNOSIS — L8990 Pressure ulcer of unspecified site, unspecified stage: Secondary | ICD-10-CM | POA: Insufficient documentation

## 2020-09-16 DIAGNOSIS — K921 Melena: Secondary | ICD-10-CM

## 2020-09-16 DIAGNOSIS — L89899 Pressure ulcer of other site, unspecified stage: Secondary | ICD-10-CM | POA: Insufficient documentation

## 2020-09-16 DIAGNOSIS — K922 Gastrointestinal hemorrhage, unspecified: Secondary | ICD-10-CM | POA: Diagnosis not present

## 2020-09-16 DIAGNOSIS — D649 Anemia, unspecified: Secondary | ICD-10-CM | POA: Diagnosis not present

## 2020-09-16 DIAGNOSIS — J9611 Chronic respiratory failure with hypoxia: Secondary | ICD-10-CM | POA: Diagnosis not present

## 2020-09-16 DIAGNOSIS — D62 Acute posthemorrhagic anemia: Secondary | ICD-10-CM

## 2020-09-16 DIAGNOSIS — K31811 Angiodysplasia of stomach and duodenum with bleeding: Principal | ICD-10-CM

## 2020-09-16 HISTORY — PX: ESOPHAGOGASTRODUODENOSCOPY (EGD) WITH PROPOFOL: SHX5813

## 2020-09-16 HISTORY — PX: HOT HEMOSTASIS: SHX5433

## 2020-09-16 HISTORY — PX: HEMOSTASIS CLIP PLACEMENT: SHX6857

## 2020-09-16 LAB — CBC
HCT: 25.5 % — ABNORMAL LOW (ref 36.0–46.0)
Hemoglobin: 8.6 g/dL — ABNORMAL LOW (ref 12.0–15.0)
MCH: 29.3 pg (ref 26.0–34.0)
MCHC: 33.7 g/dL (ref 30.0–36.0)
MCV: 86.7 fL (ref 80.0–100.0)
Platelets: 195 10*3/uL (ref 150–400)
RBC: 2.94 MIL/uL — ABNORMAL LOW (ref 3.87–5.11)
RDW: 18.9 % — ABNORMAL HIGH (ref 11.5–15.5)
WBC: 12.6 10*3/uL — ABNORMAL HIGH (ref 4.0–10.5)
nRBC: 2.5 % — ABNORMAL HIGH (ref 0.0–0.2)

## 2020-09-16 LAB — BPAM RBC
Blood Product Expiration Date: 202202262359
Blood Product Expiration Date: 202203182359
Blood Product Expiration Date: 202203192359
Blood Product Expiration Date: 202203212359
ISSUE DATE / TIME: 202202191953
ISSUE DATE / TIME: 202202192256
ISSUE DATE / TIME: 202202200615
ISSUE DATE / TIME: 202202200907
Unit Type and Rh: 5100
Unit Type and Rh: 5100
Unit Type and Rh: 5100
Unit Type and Rh: 5100

## 2020-09-16 LAB — TYPE AND SCREEN
ABO/RH(D): O POS
Antibody Screen: NEGATIVE
Unit division: 0
Unit division: 0
Unit division: 0
Unit division: 0

## 2020-09-16 LAB — URINE CULTURE: Culture: NO GROWTH

## 2020-09-16 LAB — COMPREHENSIVE METABOLIC PANEL
ALT: 13 U/L (ref 0–44)
AST: 12 U/L — ABNORMAL LOW (ref 15–41)
Albumin: 2.1 g/dL — ABNORMAL LOW (ref 3.5–5.0)
Alkaline Phosphatase: 54 U/L (ref 38–126)
Anion gap: 6 (ref 5–15)
BUN: 25 mg/dL — ABNORMAL HIGH (ref 8–23)
CO2: 22 mmol/L (ref 22–32)
Calcium: 8 mg/dL — ABNORMAL LOW (ref 8.9–10.3)
Chloride: 112 mmol/L — ABNORMAL HIGH (ref 98–111)
Creatinine, Ser: 0.7 mg/dL (ref 0.44–1.00)
GFR, Estimated: >60 mL/min (ref >60)
Glucose, Bld: 177 mg/dL — ABNORMAL HIGH (ref 70–99)
Potassium: 4 mmol/L (ref 3.5–5.1)
Sodium: 140 mmol/L (ref 135–145)
Total Bilirubin: 0.6 mg/dL (ref 0.3–1.2)
Total Protein: 5.6 g/dL — ABNORMAL LOW (ref 6.5–8.1)

## 2020-09-16 LAB — GLUCOSE, CAPILLARY
Glucose-Capillary: 145 mg/dL — ABNORMAL HIGH (ref 70–99)
Glucose-Capillary: 153 mg/dL — ABNORMAL HIGH (ref 70–99)
Glucose-Capillary: 191 mg/dL — ABNORMAL HIGH (ref 70–99)
Glucose-Capillary: 235 mg/dL — ABNORMAL HIGH (ref 70–99)
Glucose-Capillary: 90 mg/dL (ref 70–99)

## 2020-09-16 LAB — MAGNESIUM: Magnesium: 2.2 mg/dL (ref 1.7–2.4)

## 2020-09-16 LAB — PHOSPHORUS: Phosphorus: 2.5 mg/dL (ref 2.5–4.6)

## 2020-09-16 LAB — PATHOLOGIST SMEAR REVIEW

## 2020-09-16 LAB — SARS CORONAVIRUS 2 (TAT 6-24 HRS): SARS Coronavirus 2: NEGATIVE

## 2020-09-16 SURGERY — ESOPHAGOGASTRODUODENOSCOPY (EGD) WITH PROPOFOL
Anesthesia: Monitor Anesthesia Care

## 2020-09-16 MED ORDER — PROPOFOL 500 MG/50ML IV EMUL
INTRAVENOUS | Status: DC | PRN
  Administered 2020-09-16: 75 ug/kg/min via INTRAVENOUS

## 2020-09-16 MED ORDER — LIDOCAINE 2% (20 MG/ML) 5 ML SYRINGE
INTRAMUSCULAR | Status: DC | PRN
  Administered 2020-09-16: 20 mg via INTRAVENOUS

## 2020-09-16 MED ORDER — LACTATED RINGERS IV SOLN: INTRAVENOUS | Status: DC | PRN

## 2020-09-16 SURGICAL SUPPLY — 15 items

## 2020-09-16 NOTE — Progress Notes (Addendum)
Attempted to call Daughter [name] at [phone] no answer to get permit verbal consent.

## 2020-09-16 NOTE — Discharge Instructions (Signed)
Please follow up with Dr. Loanne Drilling, your Endocrinologist, due to your A1c level  Of 10.1%. Your glucose levels are too high at this time. Try to take your medications everyday and check you glucose twice a day as he instructed.

## 2020-09-16 NOTE — Anesthesia Postprocedure Evaluation (Signed)
Anesthesia Post Note  Patient: Crystal Moore  Procedure(s) Performed: ESOPHAGOGASTRODUODENOSCOPY (EGD) WITH PROPOFOL (N/A ) HOT HEMOSTASIS (ARGON PLASMA COAGULATION/BICAP) (N/A ) HEMOSTASIS CLIP PLACEMENT     Patient location during evaluation: PACU Anesthesia Type: MAC Level of consciousness: sedated Pain management: pain level controlled Vital Signs Assessment: post-procedure vital signs reviewed and stable Respiratory status: spontaneous breathing and respiratory function stable Cardiovascular status: stable Postop Assessment: no apparent nausea or vomiting Anesthetic complications: no   No complications documented.  Last Vitals:  Vitals:   09/16/20 0837 09/16/20 0858  BP: (!) 126/51 (!) 119/53  Pulse: 94   Resp: 20   Temp:    SpO2: 98%     Last Pain:  Vitals:   09/16/20 0827  TempSrc: Temporal  PainSc:                  Merlinda Frederick

## 2020-09-16 NOTE — Op Note (Signed)
[facility] Patient Name: Crystal Moore Procedure Date : 09/16/2020 MRN: [id number] Attending MD: Gerrit Heck , MD Date of Birth: 06-29-1946 CSN: [id number] Age: 75 Admit Type: Inpatient Procedure:                Upper GI endoscopy Indications:              Acute post hemorrhagic anemia, Melena                           75 year old female presents with melena and acute                            blood loss anemia in the setting of aspirin/Plavix.                            Admission hemoglobin 4.5, requiring 4 unit PRBC                            transfusion with repeat hemoglobin 8.6 this morning. Providers:                Gerrit Heck, MD, Josie Dixon, RN, Ladona Ridgel, Technician Referring MD:              Medicines:                Monitored Anesthesia Care Complications:            No immediate complications. Estimated Blood Loss:     Estimated blood loss was minimal. Procedure:                Pre-Anesthesia Assessment:                           - Prior to the procedure, a History and Physical                            was performed, and patient medications and                            allergies were reviewed. The patient's tolerance of                            previous anesthesia was also reviewed. The risks                            and benefits of the procedure and the sedation                            options and risks were discussed with the patient.                            All questions were answered, and informed consent  was obtained. Prior Anticoagulants: The patient has                            taken Plavix (clopidogrel), last dose was 2 days                            prior to procedure. ASA Grade Assessment: III - A                            patient with severe systemic disease. After                            reviewing the risks and benefits, the patient was                             deemed in satisfactory condition to undergo the                            procedure.                           After obtaining informed consent, the endoscope was                            passed under direct vision. Throughout the                            procedure, the patient's blood pressure, pulse, and                            oxygen saturations were monitored continuously. The                            GIF-H190 ([id number] Olympus gastroscope was                            introduced through the mouth, and advanced to the                            second part of duodenum. The upper GI endoscopy was                            accomplished without difficulty. The patient                            tolerated the procedure well. Scope In: Scope Out: Findings:      The examined esophagus was normal.      Red blood was found in the gastric fundus and in the gastric body.       Lavage of the area was performed using tap water, resulting in clearance       with good visualization.      A single small angioectasia with bleeding was found in the gastric body.       Coagulation for hemostasis using argon plasma was successful. One  hemostatic clip was then successfully placed (MR conditional) for dual       purposes of additional hemostasis along with radiographic and/or       endoscopic identification in the event of rebleeding. There was no       bleeding at the end of the procedure.      Normal mucosa was found in the remainder of the stomach.      The examined duodenum was normal. Impression:               - Normal esophagus.                           - Red blood in the gastric fundus and in the                            gastric body.                           - A single bleeding angioectasia in the stomach.                            Treated with argon plasma coagulation (APC). Clip                            (MR conditional) was placed.                            - Normal mucosa was found in the entire stomach.                           - Normal examined duodenum.                           - No specimens collected. Recommendation:           - Return patient to hospital ward for ongoing care.                           - Full liquid diet today. If no signs of                            rebleeding, can advance diet tomorrow.                           - Continue Protonix (pantoprazole) 40 mg PO BID for                            6 weeks for both mucosal healing and continued                            gastric prophylaxis, then can reduce to 40 mg daily                            and continue titrating down.                           -  Continue holding ASA/Plavix for another 2 days.                            Recommend discussion with Vascular Surgery re:                            optimal timing to restart antiplatelet therapy and                            need for dual antiplatelet therapy vs reducing to                            single agent in the setting of recent stenting of                            the left superficial femoral artery and left                            popliteal artery earlier this month.                           - Continue to trend serial Hgb/Hct.                           I discussed these results with the patient at                            bedside along with her daughter, Crystal Moore, by                            telephone. Procedure Code(s):        --- Professional ---                           [phone], Esophagogastroduodenoscopy, flexible,                            transoral; with control of bleeding, any method Diagnosis Code(s):        --- Professional ---                           K92.2, Gastrointestinal hemorrhage, unspecified                           K31.811, Angiodysplasia of stomach and duodenum                            with bleeding                           D62, Acute posthemorrhagic anemia                            K92.1, Melena (includes Hematochezia) CPT copyright 2019 [employer]. All rights reserved. The codes documented in this report are preliminary and upon coder review may  be revised to meet current compliance requirements. Gerrit Heck, MD 09/16/2020 8:38:51 AM Number of Addenda: 0

## 2020-09-16 NOTE — Anesthesia Procedure Notes (Signed)
Procedure Name: MAC Date/Time: 09/16/2020 8:00 AM Performed by: Leonor Liv, CRNA Pre-anesthesia Checklist: Patient identified, Emergency Drugs available, Suction available, Patient being monitored and Timeout performed Patient Re-evaluated:Patient Re-evaluated prior to induction Oxygen Delivery Method: Nasal cannula Airway Equipment and Method: Bite block Placement Confirmation: positive ETCO2 Dental Injury: Teeth and Oropharynx as per pre-operative assessment

## 2020-09-16 NOTE — Transfer of Care (Signed)
Immediate Anesthesia Transfer of Care Note  Patient: Crystal Moore  Procedure(s) Performed: ESOPHAGOGASTRODUODENOSCOPY (EGD) WITH PROPOFOL (N/A ) HOT HEMOSTASIS (ARGON PLASMA COAGULATION/BICAP) (N/A ) HEMOSTASIS CLIP PLACEMENT  Patient Location: Endoscopy Unit  Anesthesia Type:MAC  Level of Consciousness: sedated  Airway & Oxygen Therapy: Patient Spontanous Breathing and Patient connected to nasal cannula oxygen  Post-op Assessment: Report given to RN and Post -op Vital signs reviewed and stable  Post vital signs: Reviewed and stable  Last Vitals:  Vitals Value Taken Time  BP 117/42 09/16/20 0827  Temp 36.4 C 09/16/20 0827  Pulse 79 09/16/20 0831  Resp 20 09/16/20 0831  SpO2 99 % 09/16/20 0831  Vitals shown include unvalidated device data.  Last Pain:  Vitals:   09/16/20 0827  TempSrc: Temporal  PainSc:          Complications: No complications documented.

## 2020-09-16 NOTE — Anesthesia Preprocedure Evaluation (Addendum)
Anesthesia Evaluation  Patient identified by MRN, date of birth, ID band  Reviewed: Allergy & Precautions, NPO status , Patient's Chart, lab work & pertinent test results  Airway Mallampati: II  TM Distance: >3 FB Neck ROM: Full    Dental  (+) Edentulous Upper, Edentulous Lower   Pulmonary COPD, Current Smoker,  H/o COVID-19   Pulmonary exam normal breath sounds clear to auscultation       Cardiovascular Exercise Tolerance: Good hypertension, + Peripheral Vascular Disease  Normal cardiovascular exam Rhythm:Regular Rate:Normal  14-Sep-2020 17:10:01 [facility]/ED ROUTINE RECORD Sinus rhythm new Nonspecific repol abnormality, diffuse leads Confirmed by Blanchie Dessert [phone]) on 09/14/2020 5:12:41 PM   Neuro/Psych Seizures -,  PSYCHIATRIC DISORDERS Depression TIACVA (left hemiparesis), Residual Symptoms    GI/Hepatic negative GI ROS, Neg liver ROS,   Endo/Other  diabetes  Renal/GU Renal disease  negative genitourinary   Musculoskeletal  (+) Arthritis , Osteoarthritis,    Abdominal   Peds negative pediatric ROS (+)  Hematology  (+) anemia ,   Anesthesia Other Findings   Reproductive/Obstetrics negative OB ROS                            Anesthesia Physical Anesthesia Plan  ASA: III  Anesthesia Plan: MAC   Post-op Pain Management:    Induction: Intravenous  PONV Risk Score and Plan: 1 and Propofol infusion and Treatment may vary due to age or medical condition  Airway Management Planned: Natural Airway and Simple Face Mask  Additional Equipment: None  Intra-op Plan:   Post-operative Plan:   Informed Consent: I have reviewed the patients History and Physical, chart, labs and discussed the procedure including the risks, benefits and alternatives for the proposed anesthesia with the patient or authorized representative who has indicated his/her understanding and  acceptance.       Plan Discussed with: Anesthesiologist and CRNA  Anesthesia Plan Comments:        Anesthesia Quick Evaluation

## 2020-09-16 NOTE — Progress Notes (Signed)
Permit was read to Daughter Avel Sensor and she agree to [facility] phone verbal consent for EGD. Witness by Kae Heller.N.

## 2020-09-16 NOTE — Progress Notes (Signed)
PROGRESS NOTE  Crystal Moore:[id number] DOB: 1946-04-30 DOA: 09/14/2020 PCP: [name], MD   LOS: 2 days   Brief Narrative / Interim history: Crystal Moore is a 75 y.o. femalewith medical history significant ofdiabetes, peripheral vascular disease, COPD, former  tobacco use disorder, history of CVA, OSA, dementia, gout, essential hypertension who presented to the hospital on 09/14/2020 secondary to feeling unwell, and was found to be severely anemic.  Family reported patient having black stools.  She recently underwent left leg arteriogram and arterial stenting for PVD, discharged on 09/06/2020 on aspirin and Plavix.  Subjective / 24h Interval events: No overnight events, no further bleeding  She has no complaints for me this morning, is alert but has underlying dementia  Assessment & Plan: Principal Problem Severe acute blood loss anemia due to probable upper GI bleeding -Given reported dark stools probably upper GI source. Hemoglobin 4.3 on admission, received total of 4 units of packed red blood cells, hemoglobin 8.6 which is an appropriate increase -Continue iv PPI -GI consulted, she is can get an endoscopy today  Active Problems Severe PVD -S/p right SFA popliteal stenting by vascular surgery on 08/30/20.  Discussed over the phone with Dr. Stanford Breed on 2/20, she ideally needs to be on dual antiplatelet therapy however have to stop now due to GI bleed.  Will await EGD and discussion with GI afterwards regarding antiplatelet agents.   Hypokalemia, hypomagnesemia, hypophosphatemia -Possibly due to poor p.o. intake, repleted, all values within normal parameters this morning  COPD/ILD, chronic hypoxic respiratory failure -On chronic 2 L oxygen at home.  Overall stable  Essential hypertension -Hold blood pressure medications due to active GI bleed  Hyperlipidemia -Continue atorvastatin  Dementia -Delirium precautions -Fall precautions -Encourage a familiar face  to remain present throughout the day -Keep blinds open and lights on during daylight hours -Minimize the use of opioids/benzodiazepines  Type 2 diabetes mellitus, with hyperglycemia, poorly controlled -A1c 10.1 just couple of weeks ago, continue Lantus, sliding scale, monitor CBGs closely  CBG (last 3)  Recent Labs    09/15/20 2114 09/16/20 0030 09/16/20 0600  GLUCAP 192* 191* 153*    Scheduled Meds: . [MAR Hold] atorvastatin  80 mg Oral q1800  . [MAR Hold] gabapentin  300 mg Oral TID  . [MAR Hold] insulin aspart  0-5 Units Subcutaneous QHS  . [MAR Hold] insulin aspart  0-9 Units Subcutaneous TID WC  . [MAR Hold] insulin glargine  15 Units Subcutaneous Daily  . [MAR Hold] pantoprazole  40 mg Intravenous Q12H   Continuous Infusions: . pantoprozole (PROTONIX) infusion 8 mg/hr (09/15/20 2246)   PRN Meds:.[MAR Hold] acetaminophen **OR** [MAR Hold] acetaminophen, [MAR Hold] albuterol, [MAR Hold] ondansetron **OR** [MAR Hold] ondansetron (ZOFRAN) IV  Diet Orders (From admission, onward)    Start     Ordered   09/15/20 1831  Diet NPO time specified  Diet effective now        09/15/20 1830          DVT prophylaxis: SCDs Start: 09/14/20 1946     Code Status: Full Code  Family Communication: no family at bedside   Status is: Inpatient  Remains inpatient appropriate because:Inpatient level of care appropriate due to severity of illness  Dispo: The patient is from: Home              Anticipated d/c is to: Home              Anticipated d/c date is: 3 days  Patient currently is not medically stable to d/c.   Difficult to place patient No   Level of care: Progressive  Consultants:  GI  Procedures:  None   Microbiology  None   Antimicrobials: None     Objective: Vitals:   09/15/20 2341 09/16/20 0302 09/16/20 0445 09/16/20 0731  BP: 132/70 128/79  (!) 129/48  Pulse: 86 96  82  Resp: 13 10  14   Temp: 98.2 F (36.8 C) 98.2 F (36.8 C)  97.7 F  (36.5 C)  TempSrc: Oral Oral  Temporal  SpO2: 99% 98%  100%  Weight:   60.9 kg 60.9 kg  Height:    5\' 2"  (1.575 m)    Intake/Output Summary (Last 24 hours) at 09/16/2020 0811 Last data filed at 09/16/2020 1829 Gross per 24 hour  Intake 120 ml  Output 600 ml  Net -480 ml   Filed Weights   09/15/20 1809 09/16/20 0445 09/16/20 0731  Weight: 60 kg 60.9 kg 60.9 kg    Examination:  Constitutional: No distress Eyes: No scleral icterus ENMT: mmm Neck: normal, supple Respiratory: Lungs are clear bilaterally without wheezing or crackles, normal respiratory effort.  Diminished at the bases Cardiovascular: Regular rate and rhythm, no murmurs, trace edema Abdomen: Soft, nontender, nondistended, bowel sounds positive Musculoskeletal: no clubbing / cyanosis.  Skin: No rashes seen Neurologic: Nonfocal, equal strength  Data Reviewed: I have independently reviewed following labs and imaging studies   CBC: Recent Labs  Lab 09/14/20 1730 09/15/20 0314 09/15/20 1520 09/16/20 0147  WBC 15.7* 8.0 14.7* 12.6*  NEUTROABS 13.8*  --   --   --   HGB 4.5* 4.3* 10.6* 8.6*  HCT 15.6* 14.4* 32.8* 25.5*  MCV 91.8 90.6 88.2 86.7  PLT 298 153 252 937   Basic Metabolic Panel: Recent Labs  Lab 09/14/20 1730 09/15/20 0314 09/15/20 1520 09/15/20 1757 09/16/20 0147  NA 137 143 137  --  140  K 4.9 2.8* 5.4* 4.0 4.0  CL 103 122* 109  --  112*  CO2 15* 14* 22  --  22  GLUCOSE 390* 208* 305*  --  177*  BUN 37* 27* 28*  --  25*  CREATININE 1.04* 0.49 0.76  --  0.70  CALCIUM 8.8* 5.4* 8.4*  --  8.0*  MG  --  1.1* 2.8*  --  2.2  PHOS  --  2.0* 3.7  --  2.5   Liver Function Tests: Recent Labs  Lab 09/14/20 1730 09/15/20 0314 09/16/20 0147  AST 26 12* 12*  ALT 19 12 13   ALKPHOS 60 34* 54  BILITOT 0.6 0.2* 0.6  PROT 6.3* 3.6* 5.6*  ALBUMIN 2.3* 1.3* 2.1*   Coagulation Profile: Recent Labs  Lab 09/15/20 0314  INR 1.7*   HbA1C: No results for input(s): HGBA1C in the last 72  hours. CBG: Recent Labs  Lab 09/15/20 0857 09/15/20 1221 09/15/20 2114 09/16/20 0030 09/16/20 0600  GLUCAP 235* 238* 192* 191* 153*    Recent Results (from the past 240 hour(s))  SARS CORONAVIRUS 2 (TAT 6-24 HRS) Nasopharyngeal Nasopharyngeal Swab     Status: None   Collection Time: 09/15/20  6:58 PM   Specimen: Nasopharyngeal Swab  Result Value Ref Range Status   SARS Coronavirus 2 NEGATIVE NEGATIVE Final    Comment: (NOTE) SARS-CoV-2 target nucleic acids are NOT DETECTED.  The SARS-CoV-2 RNA is generally detectable in upper and lower respiratory specimens during the acute phase of infection. Negative results do not preclude SARS-CoV-2 infection,  do not rule out co-infections with other pathogens, and should not be used as the sole basis for treatment or other patient management decisions. Negative results must be combined with clinical observations, patient history, and epidemiological information. The expected result is Negative.  Fact Sheet for Patients: SugarRoll.be  Fact Sheet for Healthcare Providers: https://www.woods-mathews.com/  This test is not yet approved or cleared by the [place] FDA and  has been authorized for detection and/or diagnosis of SARS-CoV-2 by FDA under an Emergency Use Authorization (EUA). This EUA will remain  in effect (meaning this test can be used) for the duration of the COVID-19 declaration under Se ction 564(b)(1) of the Act, 21 U.S.C. section 360bbb-3(b)(1), unless the authorization is terminated or revoked sooner.  Performed at [facility], [place] [address] [place], [place] [address]      Radiology Studies: No results found.  Marzetta Board, MD, PhD [facility]  Between 7 am - 7 pm I am available, please contact me via Amion or Securechat  Between 7 pm - 7 am I am not available, please contact night coverage MD/APP via Amion

## 2020-09-16 NOTE — Interval H&P Note (Signed)
History and Physical Interval Note:  09/16/2020 7:25 AM  Crystal Moore  has presented today for surgery, with the diagnosis of melena.  The various methods of treatment have been discussed with the patient and family. After consideration of risks, benefits and other options for treatment, the patient has consented to  Procedure(s): ESOPHAGOGASTRODUODENOSCOPY (EGD) WITH PROPOFOL (N/A) as a surgical intervention.  The patient's history has been reviewed, patient examined, no change in status, stable for surgery.  I have reviewed the patient's chart and labs.  Questions were answered to the patient's satisfaction.     Dominic Pea Dia Jefferys

## 2020-09-16 NOTE — Progress Notes (Addendum)
Inpatient Diabetes Program Recommendations  AACE/ADA: New Consensus Statement on Inpatient Glycemic Control (2015)  Target Ranges:  Prepandial:   less than 140 mg/dL      Peak postprandial:   less than 180 mg/dL (1-2 hours)      Critically ill patients:  140 - 180 mg/dL   Lab Results  Component Value Date   GLUCAP 235 (H) 09/16/2020   HGBA1C 10.1 (H) 08/27/2020    Review of Glycemic Control  Diabetes history: DM 2 Outpatient Diabetes medications: Lantus 20 units Daily, Tradjenta 5 mg Daily Current orders for Inpatient glycemic control:  Lantus 15 units Daily Novolog 0-9 units tid + hs  Inpatient Diabetes Program Recommendations:    Spoke with grandson over the phone. Daughter's phone went straight to voicemail (mailbox full). Grandson reports pt receives insulin everyday at home but not the same time. Attached information to d/c paperwork to help guide the family on what to feed pt. Stressed importance of glucose control in light of diagnosis.  Daughter called back and said she was following the hospital diet her mom was on last admission and she has been eating very well the past 3 weeks and has been watching her carbohydrate portions. She is due for another visit with her Endocrinologist so daughter will schedule this soon to see how improved her glucose levels are.   Thanks,  Tama Headings RN, MSN, BC-ADM Inpatient Diabetes Coordinator Team Pager [phone] (8a-5p)

## 2020-09-17 ENCOUNTER — Encounter (HOSPITAL_COMMUNITY): Payer: Self-pay | Admitting: Internal Medicine

## 2020-09-17 DIAGNOSIS — D649 Anemia, unspecified: Secondary | ICD-10-CM | POA: Diagnosis not present

## 2020-09-17 DIAGNOSIS — K922 Gastrointestinal hemorrhage, unspecified: Secondary | ICD-10-CM | POA: Diagnosis not present

## 2020-09-17 DIAGNOSIS — D62 Acute posthemorrhagic anemia: Secondary | ICD-10-CM

## 2020-09-17 DIAGNOSIS — J9611 Chronic respiratory failure with hypoxia: Secondary | ICD-10-CM | POA: Diagnosis not present

## 2020-09-17 LAB — BASIC METABOLIC PANEL
Anion gap: 7 (ref 5–15)
BUN: 15 mg/dL (ref 8–23)
CO2: 23 mmol/L (ref 22–32)
Calcium: 8 mg/dL — ABNORMAL LOW (ref 8.9–10.3)
Chloride: 108 mmol/L (ref 98–111)
Creatinine, Ser: 0.74 mg/dL (ref 0.44–1.00)
GFR, Estimated: >60 mL/min (ref >60)
Glucose, Bld: 145 mg/dL — ABNORMAL HIGH (ref 70–99)
Potassium: 3.4 mmol/L — ABNORMAL LOW (ref 3.5–5.1)
Sodium: 138 mmol/L (ref 135–145)

## 2020-09-17 LAB — GLUCOSE, CAPILLARY
Glucose-Capillary: 117 mg/dL — ABNORMAL HIGH (ref 70–99)
Glucose-Capillary: 175 mg/dL — ABNORMAL HIGH (ref 70–99)
Glucose-Capillary: 192 mg/dL — ABNORMAL HIGH (ref 70–99)
Glucose-Capillary: 136 mg/dL — ABNORMAL HIGH (ref 70–99)

## 2020-09-17 LAB — CBC
HCT: 25.1 % — ABNORMAL LOW (ref 36.0–46.0)
Hemoglobin: 8.2 g/dL — ABNORMAL LOW (ref 12.0–15.0)
MCH: 28.7 pg (ref 26.0–34.0)
MCHC: 32.7 g/dL (ref 30.0–36.0)
MCV: 87.8 fL (ref 80.0–100.0)
Platelets: 180 10*3/uL (ref 150–400)
RBC: 2.86 MIL/uL — ABNORMAL LOW (ref 3.87–5.11)
RDW: 19.1 % — ABNORMAL HIGH (ref 11.5–15.5)
WBC: 9.2 10*3/uL (ref 4.0–10.5)
nRBC: 0.8 % — ABNORMAL HIGH (ref 0.0–0.2)

## 2020-09-17 LAB — PHOSPHORUS: Phosphorus: 3.3 mg/dL (ref 2.5–4.6)

## 2020-09-17 LAB — MAGNESIUM: Magnesium: 1.9 mg/dL (ref 1.7–2.4)

## 2020-09-17 MED ORDER — PANTOPRAZOLE SODIUM 40 MG PO TBEC
40.0000 mg | DELAYED_RELEASE_TABLET | Freq: Two times a day (BID) | ORAL | Status: DC
  Administered 2020-09-17 – 2020-09-18 (×2): 40 mg via ORAL
  Filled 2020-09-17 (×2): qty 1

## 2020-09-17 MED ORDER — POTASSIUM CHLORIDE CRYS ER 20 MEQ PO TBCR
40.0000 meq | EXTENDED_RELEASE_TABLET | Freq: Once | ORAL | Status: AC
  Administered 2020-09-17: 40 meq via ORAL
  Filled 2020-09-17: qty 2

## 2020-09-17 MED ORDER — SODIUM CHLORIDE 0.9 % IV SOLN
510.0000 mg | Freq: Once | INTRAVENOUS | Status: AC
  Administered 2020-09-17: 510 mg via INTRAVENOUS
  Filled 2020-09-17: qty 17

## 2020-09-17 MED ORDER — FUROSEMIDE 10 MG/ML IJ SOLN
40.0000 mg | Freq: Once | INTRAMUSCULAR | Status: AC
  Administered 2020-09-17: 40 mg via INTRAVENOUS
  Filled 2020-09-17: qty 4

## 2020-09-17 NOTE — Progress Notes (Signed)
Spoke with son concerning patient mobility.  Per grandson she was able to stand and shuffle to sit at home.  MD aware and has ordered PT evaluation and treatment. Pt resting with call bell within reach.  Will continue to monitor.

## 2020-09-17 NOTE — Progress Notes (Signed)
PROGRESS NOTE  Crystal Moore CWC:[id number] DOB: 1946-04-28 DOA: 09/14/2020 PCP: Crystal Cha, MD   LOS: 3 days   Brief Narrative / Interim history: Crystal Moore is a 74 y.o. femalewith medical history significant ofdiabetes, peripheral vascular disease, COPD, former  tobacco use disorder, history of CVA, OSA, dementia, gout, essential hypertension who presented to the hospital on 09/14/2020 secondary to feeling unwell, and was found to be severely anemic.  Family reported patient having black stools.  She recently underwent left leg arteriogram and arterial stenting for PVD, discharged on 09/06/2020 on aspirin and Plavix.  Subjective / 24h Interval events: No overnight events, no further bleeding  Patient tells me this morning that she feels very good, slept well last night and feels the better she has felt since coming here.  Assessment & Plan: Principal Problem Acute blood loss anemia due to probable upper GI bleeding -Given reported dark stools probably upper GI source. Hemoglobin 4.3 on admission, received total of 4 units of packed red blood cells, hemoglobin improved into the 8 range and has remained stable -GI consulted, patient underwent an EGD yesterday which showed red blood in the gastric fundus and body, and a single small angiectasia with bleeding in the gastric body status post APC and hemostatic clip was placed. -For now continue to hold aspirin and Plavix, continue PPI, okay with gastroenterology to resume aspirin after 2 days from the endoscopy  Active Problems Severe PVD -S/p right SFA popliteal stenting by vascular surgery on 08/30/20.  Discussed over the phone with Dr. Stanford Moore on 2/20, she ideally needs to be on dual antiplatelet therapy however have to stop now due to GI bleed.  From vascular standpoint she could be on aspirin alone and reassessed as an outpatient versus a trial of aspirin and Plavix -Discussed with GI as well Dr. Bryan Moore on 2/21, probably  safest thing to do right now is to resume aspirin at 48 hours after her endoscopy, watch, and likely resume Plavix as an outpatient if she shows stability  Hypokalemia, hypomagnesemia, hypophosphatemia -Possibly due to poor p.o. intake, continue to replete potassium  COPD/ILD, chronic hypoxic respiratory failure -On chronic 2 L oxygen at home.  Overall stable.  We will give Lasix today after all the blood transfusion she sounds a little bit wet  Essential hypertension -Hold blood pressure medications due to GI bleed.  Blood pressure normal today  Hyperlipidemia -Continue atorvastatin  Dementia -Delirium precautions -Fall precautions -Encourage a familiar face to remain present throughout the day -Keep blinds open and lights on during daylight hours -Minimize the use of opioids/benzodiazepines  Type 2 diabetes mellitus, with hyperglycemia, poorly controlled -A1c 10.1 just couple of weeks ago, continue Lantus, sliding scale, monitor CBGs closely  CBG (last 3)  Recent Labs    09/16/20 1624 09/16/20 2103 09/17/20 0626  GLUCAP 145* 90 117*    Scheduled Meds: . atorvastatin  80 mg Oral q1800  . furosemide  40 mg Intravenous Once  . gabapentin  300 mg Oral TID  . insulin aspart  0-5 Units Subcutaneous QHS  . insulin aspart  0-9 Units Subcutaneous TID WC  . insulin glargine  15 Units Subcutaneous Daily  . [START ON 09/18/2020] pantoprazole  40 mg Intravenous Q12H   Continuous Infusions: . pantoprozole (PROTONIX) infusion 8 mg/hr (09/17/20 0407)   PRN Meds:.acetaminophen **OR** acetaminophen, albuterol, ondansetron **OR** ondansetron (ZOFRAN) IV  Diet Orders (From admission, onward)    Start     Ordered   09/16/20 0857  Diet  full liquid Room service appropriate? No; Fluid consistency: Thin  Diet effective now       Question Answer Comment  Room service appropriate? No   Fluid consistency: Thin      09/16/20 0856          DVT prophylaxis: SCDs Start: 09/14/20  1946     Code Status: Full Code  Family Communication: no family at bedside   Status is: Inpatient  Remains inpatient appropriate because:Inpatient level of care appropriate due to severity of illness  Dispo: The patient is from: Home              Anticipated d/c is to: Home              Anticipated d/c date is: 3 days              Patient currently is not medically stable to d/c.   Difficult to place patient No   Level of care: Progressive  Consultants:  GI  Procedures:  None   Microbiology  None   Antimicrobials: None     Objective: Vitals:   09/16/20 2333 09/17/20 0300 09/17/20 0400 09/17/20 1010  BP: (!) 115/51 (!) 108/58 118/61 121/60  Pulse: 78 69 73 67  Resp: 20 10 19 20   Temp: 98.5 F (36.9 C) 97.8 F (36.6 C)  98 F (36.7 C)  TempSrc: Oral Oral  Oral  SpO2: 100% 100% 100% 97%  Weight:      Height:        Intake/Output Summary (Last 24 hours) at 09/17/2020 1034 Last data filed at 09/17/2020 0200 Gross per 24 hour  Intake 400.84 ml  Output --  Net 400.84 ml   Filed Weights   09/15/20 1809 09/16/20 0445 09/16/20 0731  Weight: 60 kg 60.9 kg 60.9 kg    Examination:  Constitutional: No distress Eyes: No icterus ENMT: mmm Neck: normal, supple Respiratory: Lungs are clear bilaterally, no wheezing, no crackles, normal respiratory effort Cardiovascular: Regular rate and rhythm, no murmurs, trace edema Abdomen: Soft, NT, ND, bowel sounds positive Musculoskeletal: no clubbing / cyanosis.  Skin: No rashes seen Neurologic: No focal deficits  Data Reviewed: I have independently reviewed following labs and imaging studies   CBC: Recent Labs  Lab 09/14/20 1730 09/15/20 0314 09/15/20 1520 09/16/20 0147 09/17/20 0047  WBC 15.7* 8.0 14.7* 12.6* 9.2  NEUTROABS 13.8*  --   --   --   --   HGB 4.5* 4.3* 10.6* 8.6* 8.2*  HCT 15.6* 14.4* 32.8* 25.5* 25.1*  MCV 91.8 90.6 88.2 86.7 87.8  PLT 298 153 252 195 706   Basic Metabolic Panel: Recent  Labs  Lab 09/14/20 1730 09/15/20 0314 09/15/20 1520 09/15/20 1757 09/16/20 0147 09/17/20 0047  NA 137 143 137  --  140 138  K 4.9 2.8* 5.4* 4.0 4.0 3.4*  CL 103 122* 109  --  112* 108  CO2 15* 14* 22  --  22 23  GLUCOSE 390* 208* 305*  --  177* 145*  BUN 37* 27* 28*  --  25* 15  CREATININE 1.04* 0.49 0.76  --  0.70 0.74  CALCIUM 8.8* 5.4* 8.4*  --  8.0* 8.0*  MG  --  1.1* 2.8*  --  2.2 1.9  PHOS  --  2.0* 3.7  --  2.5 3.3   Liver Function Tests: Recent Labs  Lab 09/14/20 1730 09/15/20 0314 09/16/20 0147  AST 26 12* 12*  ALT 19 12 13   ALKPHOS 60  34* 54  BILITOT 0.6 0.2* 0.6  PROT 6.3* 3.6* 5.6*  ALBUMIN 2.3* 1.3* 2.1*   Coagulation Profile: Recent Labs  Lab 09/15/20 0314  INR 1.7*   HbA1C: No results for input(s): HGBA1C in the last 72 hours. CBG: Recent Labs  Lab 09/16/20 0600 09/16/20 1117 09/16/20 1624 09/16/20 2103 09/17/20 0626  GLUCAP 153* 235* 145* 90 117*    Recent Results (from the past 240 hour(s))  Urine culture     Status: None   Collection Time: 09/14/20  8:49 PM   Specimen: Urine, Catheterized  Result Value Ref Range Status   Specimen Description URINE, CATHETERIZED  Final   Special Requests NONE  Final   Culture   Final    NO GROWTH Performed at [facility], [address], [place], [place] [address]    Report Status 09/16/2020 FINAL  Final  SARS CORONAVIRUS 2 (TAT 6-24 HRS) Nasopharyngeal Nasopharyngeal Swab     Status: None   Collection Time: 09/15/20  6:58 PM   Specimen: Nasopharyngeal Swab  Result Value Ref Range Status   SARS Coronavirus 2 NEGATIVE NEGATIVE Final    Comment: (NOTE) SARS-CoV-2 target nucleic acids are NOT DETECTED.  The SARS-CoV-2 RNA is generally detectable in upper and lower respiratory specimens during the acute phase of infection. Negative results do not preclude SARS-CoV-2 infection, do not rule out co-infections with other pathogens, and should not be used as the sole basis for treatment or  other patient management decisions. Negative results must be combined with clinical observations, patient history, and epidemiological information. The expected result is Negative.  Fact Sheet for Patients: SugarRoll.be  Fact Sheet for Healthcare Providers: https://www.woods-mathews.com/  This test is not yet approved or cleared by the [place] FDA and  has been authorized for detection and/or diagnosis of SARS-CoV-2 by FDA under an Emergency Use Authorization (EUA). This EUA will remain  in effect (meaning this test can be used) for the duration of the COVID-19 declaration under Se ction 564(b)(1) of the Act, 21 U.S.C. section 360bbb-3(b)(1), unless the authorization is terminated or revoked sooner.  Performed at [facility], [place] [address] [place], [place] [address]      Radiology Studies: No results found.  Marzetta Board, MD, PhD [facility]  Between 7 am - 7 pm I am available, please contact me via Amion or Securechat  Between 7 pm - 7 am I am not available, please contact night coverage MD/APP via Amion

## 2020-09-17 NOTE — Progress Notes (Signed)
Daily Rounding Note  09/17/2020, 11:47 AM  LOS: 3 days   SUBJECTIVE:   Chief complaint: upper GI bleed.  Blood loss anemia.    No stools.  No abd pain.  Ordering her next few meals, solid food just started  OBJECTIVE:         Vital signs in last 24 hours:    Temp:  [97.8 F (36.6 C)-98.5 F (36.9 C)] 98 F (36.7 C) (02/22 1010) Pulse Rate:  [67-88] 67 (02/22 1010) Resp:  [10-22] 20 (02/22 1010) BP: (100-142)/(43-69) 121/60 (02/22 1010) SpO2:  [92 %-100 %] 97 % (02/22 1010) Last BM Date: 09/16/20 Filed Weights   09/15/20 1809 09/16/20 0445 09/16/20 0731  Weight: 60 kg 60.9 kg 60.9 kg   General: alert, calm, comfortable   Heart: RRR Chest: No labored breathing.  No cough Abdomen: Soft.  Not distended or tender.  Active bowel sounds. Extremities: Soft protective booties on both feet.  Foot drop on the right.  Some crusting material on the right hallux region, wrapped in gauze. Neuro/Psych: Alert.  Follows commands.  Does not have a lot to say.  Calm.  Intake/Output from previous day: 02/21 0701 - 02/22 0700 In: 600.8 [P.O.:240; I.V.:360.8] Out: -   Intake/Output this shift: No intake/output data recorded.  Lab Results: Recent Labs    09/15/20 1520 09/16/20 0147 09/17/20 0047  WBC 14.7* 12.6* 9.2  HGB 10.6* 8.6* 8.2*  HCT 32.8* 25.5* 25.1*  PLT 252 195 180   BMET Recent Labs    09/15/20 1520 09/15/20 1757 09/16/20 0147 09/17/20 0047  NA 137  --  140 138  K 5.4* 4.0 4.0 3.4*  CL 109  --  112* 108  CO2 22  --  22 23  GLUCOSE 305*  --  177* 145*  BUN 28*  --  25* 15  CREATININE 0.76  --  0.70 0.74  CALCIUM 8.4*  --  8.0* 8.0*   LFT Recent Labs    09/14/20 1730 09/15/20 0314 09/16/20 0147  PROT 6.3* 3.6* 5.6*  ALBUMIN 2.3* 1.3* 2.1*  AST 26 12* 12*  ALT 19 12 13   ALKPHOS 60 34* 54  BILITOT 0.6 0.2* 0.6   PT/INR Recent Labs    09/15/20 0314  LABPROT 19.0*  INR 1.7*   Hepatitis  Panel No results for input(s): HEPBSAG, HCVAB, HEPAIGM, HEPBIGM in the last 72 hours.  Studies/Results: No results found.  ASSESMENT:   *    UGI bleed with black, FOBT positive stool.  . 08/2019 EGD with blood in the stomach, solitary bleeding gastric AVM treated with APC and Endo Clip.  Otherwise normal study.  *    Blood loss anemia.  Hb 4.5 >> 4 PRBCs >> 8.6 >> 8.2.  Baseline in 202o was~ 12.5.  Take FeS04 325 mg bid at home.  Low iron 22, low iron sats, ferritin 18.  *    Chronic DAPT on hold.  3 multiple CVAs, PVD, multiple periph stents.  .  *    All to pull electrolyte derangements.  *     Dementia.    PLAN   *   Protonix 40 mg po bid for 6 weeks then drop dose to 40 mg/daily.  *   Restart aspirin, Plavix 09/19/2020.  *   No need for GI office fup.  Would have PCP follow CBC over coming weeks to assure improvement.  GI signing off.     *  feraheme ordered    Crystal Moore  09/17/2020, 11:47 AM Phone [phone]

## 2020-09-18 DIAGNOSIS — K922 Gastrointestinal hemorrhage, unspecified: Secondary | ICD-10-CM | POA: Diagnosis not present

## 2020-09-18 LAB — PHOSPHORUS: Phosphorus: 3.2 mg/dL (ref 2.5–4.6)

## 2020-09-18 LAB — MAGNESIUM: Magnesium: 1.9 mg/dL (ref 1.7–2.4)

## 2020-09-18 LAB — CBC
HCT: 28.1 % — ABNORMAL LOW (ref 36.0–46.0)
Hemoglobin: 9.1 g/dL — ABNORMAL LOW (ref 12.0–15.0)
MCH: 28.4 pg (ref 26.0–34.0)
MCHC: 32.4 g/dL (ref 30.0–36.0)
MCV: 87.8 fL (ref 80.0–100.0)
Platelets: 187 K/uL (ref 150–400)
RBC: 3.2 MIL/uL — ABNORMAL LOW (ref 3.87–5.11)
RDW: 17.9 % — ABNORMAL HIGH (ref 11.5–15.5)
WBC: 9 K/uL (ref 4.0–10.5)
nRBC: 0.7 % — ABNORMAL HIGH (ref 0.0–0.2)

## 2020-09-18 LAB — BASIC METABOLIC PANEL WITH GFR
Anion gap: 8 (ref 5–15)
BUN: 11 mg/dL (ref 8–23)
CO2: 24 mmol/L (ref 22–32)
Calcium: 8.4 mg/dL — ABNORMAL LOW (ref 8.9–10.3)
Chloride: 102 mmol/L (ref 98–111)
Creatinine, Ser: 0.78 mg/dL (ref 0.44–1.00)
GFR, Estimated: >60 mL/min
Glucose, Bld: 129 mg/dL — ABNORMAL HIGH (ref 70–99)
Potassium: 3.8 mmol/L (ref 3.5–5.1)
Sodium: 134 mmol/L — ABNORMAL LOW (ref 135–145)

## 2020-09-18 LAB — GLUCOSE, CAPILLARY
Glucose-Capillary: 128 mg/dL — ABNORMAL HIGH (ref 70–99)
Glucose-Capillary: 175 mg/dL — ABNORMAL HIGH (ref 70–99)

## 2020-09-18 MED ORDER — ASPIRIN EC 81 MG PO TBEC
81.0000 mg | DELAYED_RELEASE_TABLET | Freq: Every day | ORAL | Status: DC
  Administered 2020-09-18: 81 mg via ORAL
  Filled 2020-09-18: qty 1

## 2020-09-18 MED ORDER — INSULIN GLARGINE 100 UNIT/ML ~~LOC~~ SOLN: 15.0000 [IU] | Freq: Every day | SUBCUTANEOUS | 11 refills | Status: DC

## 2020-09-18 MED ORDER — PANTOPRAZOLE SODIUM 40 MG PO TBEC: 40.0000 mg | DELAYED_RELEASE_TABLET | Freq: Two times a day (BID) | ORAL | 0 refills | Status: DC

## 2020-09-18 NOTE — TOC Transition Note (Signed)
Transition of Care (TOC) - CM/SW Discharge Note Marvetta Gibbons RN, BSN Transitions of Care Unit 4E- RN Case Manager See Treatment Team for direct phone #    Patient Details  Name: Crystal Moore MRN: [id number] Date of Birth: [birth year]  Transition of Care [facility]) CM/SW Contact:  Dawayne [name], RN Phone Number: 09/18/2020, 12:44 PM   Clinical Narrative:    Pt stable for transition home today with family. Pt has needed DME in the home, was active with [facility] prior to admit for RN/PT/OT and orders have been placed for resumption of HH needs.  Notified Amy with [facility] for resumption of care needs for HHRN/PT/OT.   Family to transport home.    Final next level of care: [place] Barriers to Discharge: No Barriers Identified   Patient Goals and CMS Choice Patient states their goals for this hospitalization and ongoing recovery are:: return home with [facility] CMS Medicare.gov Compare Post Acute Care list provided to:: Patient Represenative (must comment) Choice offered to / list presented to : Adult Children  Discharge Placement                 Home with [facility]      Discharge Plan and Services   Discharge Planning Services: CM Consult Post Acute Care Choice: Home Health,Resumption of Svcs/PTA Provider          DME Arranged: N/A DME Agency: NA       HH Arranged: Disease Management,RN,PT,OT HH Agency: [facility] Date HH Agency Contacted: 09/18/20 Time [place]: 1243 Representative spoke with at [place]: Amy  Social Determinants of Health ([place]) Interventions     Readmission Risk Interventions Readmission Risk Prevention Plan 09/18/2020  Transportation Screening Complete  PCP or Specialist Appt within 3-5 Days Complete  Social Work Consult for [place] Planning/Counseling [place] Not Applicable  Medication Review Press photographer) Complete  Some recent data might be hidden

## 2020-09-18 NOTE — Discharge Summary (Signed)
Physician Discharge Summary  Crystal Moore NLG:[id number] DOB: November 20, 1945 DOA: 09/14/2020  PCP: [name], MD  Admit date: 09/14/2020 Discharge date: 09/18/2020  Admitted From: Home Discharge disposition: Home with home health, PT, OT, RN   Code Status: Full Code  Diet Recommendation: Cardiac/diabetic diet  Discharge Diagnosis:   Principal Problem:   Acute upper GI bleed Active Problems:   Symptomatic anemia   Peripheral arterial disease with history of revascularization (HCC)   Chronic respiratory failure with hypoxia (HCC)   Diabetes ([facility] [facility])   Pressure injury of skin   Acute blood loss anemia   Gastric AVM    History of Present Illness / Brief narrative:  Crystal Moore a 75 y.o.femalewith medical history significant ofdiabetes, peripheral vascular disease,COPD, formertobacco use disorder, history of CVA, OSA, dementia, gout,essential hypertensionwho is taking care at home by her family.  She was brought to the ED on 2/19 for not feeling well.  She was found to be anemic with a hemoglobin low at 4.5.  Family reported patient having black stools recently. She recently underwent left leg arteriogram and arterial stenting for PVD on 08/30/20 and was discharged on 09/06/2020 on aspirin and Plavix. Patient was admitted to hospitalist service GI and vascular surgery consultation were obtained. See below for details  Subjective:  Seen and examined this morning.  Pleasant elderly African-American female.  Lying down in bed.  Alert, awake, having some conversation with her grandson at bedside  Hospital Course:  Upper GI bleeding -Presented with anemia, dark stool -2/21, underwent EGD by Dr. Bryan Lemma which showed red blood in the gastric fundus and body and a small angiectasia with bleeding in the gastric body, underwent APC and hemostatic clip placement. -Prior to admission, patient was on aspirin and Plavix after recent peripheral artery stenting on 08/30/2020.   Previous hospitalist discussed the case with vascular surgeon Dr. Stanford Breed.  Patient ideally needs to be on dual antiplatelet therapy however, because of elevated risk of recurrent GI bleeding, vascular surgery recommended to stop Plavix and resume aspirin 81 mg daily.  Patient will follow up with vascular surgery as an outpatient likely resume Plavix if stable. -Continue high-dose PPI for 6 weeks, GI notes a plan to reduce it as an outpatient.  Acute blood loss anemia Chronic iron deficiency anemia -Hemoglobin was low at 4.3 on presentation.  Patient received a total of 4 units of PRBC with improvement in hemoglobin. 9.1 today.   -Iron panel from 09/02/2020 with ferritin level low at 18.  1 dose of Feraheme was given on 2/22.  For GI recommendation, will hold off on restarting p.o. iron for the time being which will allow monitoring for recurrence of bleeding to be a little easier. -Repeat CBC 7 to 10 days with PCP after hospital discharge Recent Labs    09/02/20 1041 09/03/20 0302 09/15/20 0314 09/15/20 1520 09/16/20 0147 09/17/20 0047 09/18/20 0120  HGB  --    < > 4.3* 10.6* 8.6* 8.2* 9.1*  MCV  --    < > 90.6 88.2 86.7 87.8 87.8  FERRITIN 18  --   --   --   --   --   --   TIBC 252  --   --   --   --   --   --   IRON 22*  --   --   --   --   --   --    < > = values in this interval not displayed.  Severe peripheral artery disease -S/p right SFA popliteal stenting by vascular surgery on 08/30/20 and was started on aspirin and Plavix.  Because of GI bleeding, recommendation at this time is to continue aspirin Plavix on hold. -Follow-up with vascular surgery as an outpatient.  Hypokalemia, hypomagnesemia, hypophosphatemia -Possibly due to poor p.o. intake, continue to replete potassium Recent Labs  Lab 09/15/20 0314 09/15/20 1520 09/15/20 1757 09/16/20 0147 09/17/20 0047 09/18/20 0120  K 2.8* 5.4* 4.0 4.0 3.4* 3.8  MG 1.1* 2.8*  --  2.2 1.9 1.9  PHOS 2.0* 3.7  --  2.5 3.3 3.2    Essential hypertension -On amlodipine 5 mg daily at home.  Currently on hold and blood pressure remains stable.  I would not resume amlodipine at discharge.  Continue to monitor blood pressure at home.    Type 2 diabetes mellitus -A1c 10.1 on 2/1. -Home meds include Lantus 15 units daily, Tradjenta 5 mg daily.  Resume the same at home. Recent Labs  Lab 09/17/20 0626 09/17/20 1236 09/17/20 1816 09/17/20 2221 09/18/20 0652  GLUCAP 117* 175* 192* 136* 128*   Hyperlipidemia -Continue atorvastatin  Dementia -Delirium precautions -Fall precautions -Encourage a familiar face to remain present throughout the day -Keep blinds open and lights on during daylight hours -Minimize the use of opioids/benzodiazepines  COPD/ILD, chronic hypoxic respiratory failure -On chronic 2 L oxygen at home.  Continue the same.  Chronic immobility -Patient is significant distance and daily care which is provided by family at home.  She also gets PT/OT/RN assistance at home which will resume at discharge.  Lower extremity wounds -Wound care as below  Wound care: Wound / Incision (Open or Dehisced) 08/27/20 Diabetic ulcer Ankle Left;Lateral red, pink. yellow (Active)  Date First Assessed/Time First Assessed: 08/27/20 0025   Wound Type: Diabetic ulcer  Location: Ankle  Location Orientation: Left;Lateral  Wound Description (Comments): red, pink. yellow  Present on Admission: Yes    Assessments 08/27/2020 12:25 AM 09/18/2020  8:17 AM  Dressing Type Gauze (Comment) -  Dressing Status Intact Clean;Dry;Intact  Site / Wound Assessment Pink;Red;Yellow -  Wound Length (cm) 4 cm -  Wound Width (cm) 3 cm -  Wound Surface Area (cm^2) 12 cm^2 -     No Linked orders to display     Wound / Incision (Open or Dehisced) 09/04/20 Non-pressure wound Toe (Comment  which one) Anterior;Right pink (Active)  Date First Assessed/Time First Assessed: 09/04/20 1000   Wound Type: Non-pressure wound  Location: (c) Toe  (Comment  which one)  Location Orientation: Anterior;Right  Wound Description (Comments): pink  Present on Admission: Yes    Assessments 09/04/2020 10:00 AM 09/18/2020  8:17 AM  Dressing Type Gauze (Comment);Petroleum Gauze (Comment)  Dressing Changed Changed -  Dressing Status Clean;Dry;Intact Clean;Dry;Intact  Site / Wound Assessment Pink -  Drainage Amount Scant -  Drainage Description Serosanguineous -     No Linked orders to display     Pressure Injury 09/15/20 Buttocks Mid Stage 2 -  Partial thickness loss of dermis presenting as a shallow open injury with a red, pink wound bed without slough. small slit in middle of buttocks unable to measure pink in color (Active)  Date First Assessed/Time First Assessed: 09/15/20 2040   Location: Buttocks  Location Orientation: Mid  Staging: Stage 2 -  Partial thickness loss of dermis presenting as a shallow open injury with a red, pink wound bed without slough.  Wound Descript...    Assessments 09/15/2020  8:40 PM 09/18/2020  8:17 AM  Dressing Type Foam - Lift dressing to assess site every shift Foam - Lift dressing to assess site every shift  Dressing Clean;Dry;Intact;Other (Comment) -  Site / Wound Assessment Clean;Dry;Pink -  % Wound base Red or Granulating 0% -  % Wound base Yellow/Fibrinous Exudate 0% -  % Wound base Black/Eschar 0% -  % Wound base Other/Granulation Tissue (Comment) 100% -  Peri-wound Assessment Intact;Other (Comment) -  Margins Unattached edges (unapproximated) -  Drainage Amount None -  Treatment Cleansed -     No Linked orders to display    Discharge Exam:   Vitals:   09/17/20 1933 09/17/20 2325 09/18/20 0400 09/18/20 0800  BP: (!) 117/49 (!) 131/56 (!) 118/59 (!) 136/57  Pulse: 75 72 69 70  Resp: 20 12 13 19   Temp: 98.5 F (36.9 C) 98.6 F (37 C) 97.9 F (36.6 C) 97.8 F (36.6 C)  TempSrc: Oral Oral Oral Oral  SpO2: 91% 100% 100% 96%  Weight:      Height:        Body mass index is 24.56 kg/m.  General  exam: Pleasant elderly African-American female.  Not in physical distress Skin: No rashes, lesions or ulcers. HEENT: Atraumatic, normocephalic, no obvious bleeding Lungs: Clear to auscultation bilaterally CVS: Regular rate and rhythm, no murmur GI/Abd soft, nontender, nondistended, bowel sounds present CNS: Alert, awake and oriented x3 Psychiatry: Mood appropriate Extremities: No pedal edema, no calf tenderness.  Both lower legs with chronic wounds with bandages on.  Follow ups:   Discharge Instructions    Diet - low sodium heart healthy   Complete by: As directed    Diet Carb Modified   Complete by: As directed    Discharge wound care:   Complete by: As directed    Wound care to left lateral ankle and right foot, anterior aspect at 1st and 2nd digits:  Cleanse with NS, pat dry. Cover with xeroform gauze Kellie Simmering # [id number]), top with dry gauze and secure with a few turns of Kerlix roll gauze/paper tape.Change daily. Place foot/feet into Prevalon boots.   Increase activity slowly   Complete by: As directed       Follow-up Information    [name], MD Follow up.   Specialty: Internal Medicine Contact information: [address] [place] [address] [phone]        Cherre Robins, MD Follow up.   Specialties: Vascular Surgery, Interventional Cardiology Contact information: [place] [address] [phone]        Gerrit Heck V, DO Follow up.   Specialty: Gastroenterology Contact information: [place] [address] [phone]               Recommendations for Outpatient Follow-Up:   1. Follow-up with PCP, GI, vascular surgery as an outpatient  Discharge Instructions:  Follow with Primary MD [name], MD in 7 days   Get CBC/BMP checked in next visit within 1 week by PCP or SNF MD ( we routinely change or add medications that can affect your baseline labs and fluid  status, therefore we recommend that you get the mentioned basic workup next visit with your PCP, your PCP may decide not to get them or add new tests based on their clinical decision)  On your next visit with your PCP, please Get Medicines reviewed and adjusted.  Please request your PCP  to go over all Hospital Tests and Procedure/Radiological results at  the follow up, please get all Hospital records sent to your Prim MD by signing hospital release before you go home.  Activity: As tolerated with Full fall precautions use walker/cane & assistance as needed  For Heart failure patients - Check your Weight same time everyday, if you gain over 2 pounds, or you develop in leg swelling, experience more shortness of breath or chest pain, call your Primary MD immediately. Follow Cardiac Low Salt Diet and 1.5 lit/day fluid restriction.  If you have smoked or chewed Tobacco in the last 2 yrs please stop smoking, stop any regular Alcohol  and or any Recreational drug use.  If you experience worsening of your admission symptoms, develop shortness of breath, life threatening emergency, suicidal or homicidal thoughts you must seek medical attention immediately by calling 911 or calling your MD immediately  if symptoms less severe.  You Must read complete instructions/literature along with all the possible adverse reactions/side effects for all the Medicines you take and that have been prescribed to you. Take any new Medicines after you have completely understood and accpet all the possible adverse reactions/side effects.   Do not drive, operate heavy machinery, perform activities at heights, swimming or participation in water activities or provide baby sitting services if your were admitted for syncope or siezures until you have seen by Primary MD or a Neurologist and advised to do so again.  Do not drive when taking Pain medications.  Do not take more than prescribed Pain, Sleep and Anxiety Medications  Wear  Seat belts while driving.   Please note You were cared for by a hospitalist during your hospital stay. If you have any questions about your discharge medications or the care you received while you were in the hospital after you are discharged, you can call the unit and asked to speak with the hospitalist on call if the hospitalist that took care of you is not available. Once you are discharged, your primary care physician will handle any further medical issues. Please note that NO REFILLS for any discharge medications will be authorized once you are discharged, as it is imperative that you return to your primary care physician (or establish a relationship with a primary care physician if you do not have one) for your aftercare needs so that they can reassess your need for medications and monitor your lab values.    Allergies as of 09/18/2020   No Known Allergies     Medication List    STOP taking these medications   amLODipine 5 MG tablet Commonly known as: NORVASC   clopidogrel 75 MG tablet Commonly known as: PLAVIX   ferrous sulfate 325 (65 FE) MG tablet     TAKE these medications   Accu-Chek Guide test strip Generic drug: glucose blood 1 each by Other route in the morning and at bedtime. And lancets 2/day   acetaminophen 500 MG tablet Commonly known as: TYLENOL Take 1,000 mg by mouth every 6 (six) hours as needed (pain). What changed: Another medication with the same name was removed. Continue taking this medication, and follow the directions you see here.   albuterol 108 (90 Base) MCG/ACT inhaler Commonly known as: VENTOLIN HFA Inhale 1 puff into the lungs every 6 (six) hours as needed for wheezing or shortness of breath.   albuterol (2.5 MG/3ML) 0.083% nebulizer solution Commonly known as: PROVENTIL Inhale 3 mLs (2.5 mg total) into the lungs every 4 (four) hours as needed for wheezing or shortness of breath.   aspirin  81 MG EC tablet Take 81 mg by mouth daily.    atorvastatin 80 MG tablet Commonly known as: LIPITOR Take 1 tablet (80 mg total) by mouth daily at 6 PM. What changed: when to take this   feeding supplement Liqd Take 237 mLs by mouth 2 (two) times daily between meals.   gabapentin 300 MG capsule Commonly known as: NEURONTIN Take 1 capsule (300 mg total) by mouth 3 (three) times daily.   insulin glargine 100 UNIT/ML injection Commonly known as: LANTUS Inject 0.15 mLs (15 Units total) into the skin daily. What changed: how much to take   linagliptin 5 MG Tabs tablet Commonly known as: TRADJENTA Take 5 mg by mouth daily.   pantoprazole 40 MG tablet Commonly known as: PROTONIX Take 1 tablet (40 mg total) by mouth 2 (two) times daily.            Discharge Care Instructions  (From admission, onward)         Start     Ordered   09/18/20 0000  Discharge wound care:       Comments: Wound care to left lateral ankle and right foot, anterior aspect at 1st and 2nd digits:  Cleanse with NS, pat dry. Cover with xeroform gauze Kellie Simmering # [id number]), top with dry gauze and secure with a few turns of Kerlix roll gauze/paper tape.Change daily. Place foot/feet into Prevalon boots.   09/18/20 1104          Time coordinating discharge: 35 minutes  The results of significant diagnostics from this hospitalization (including imaging, microbiology, ancillary and laboratory) are listed below for reference.    Procedures and Diagnostic Studies:   DG Chest Port 1 View  Result Date: 09/14/2020 CLINICAL DATA:  Weakness.  Altered mental status. EXAM: PORTABLE CHEST 1 VIEW COMPARISON:  August 27, 2019 FINDINGS: There is persistent cardiomegaly. There are hazy bilateral airspace opacities and prominent interstitial lung markings. There is a focal airspace opacity projecting over the left lower lung zone and retrocardiac region. There is no pneumothorax. Aortic calcifications are again noted. There are old left-sided rib fractures. There is a stable  calcified pulmonary nodule in the right mid lung zone. IMPRESSION: 1. Cardiomegaly with findings suggestive of mild interstitial edema. 2. Persistent airspace opacities bilaterally are nonspecific and may represent atelectasis or developing infiltrates in the appropriate clinical setting. Electronically Signed   By: Constance Holster M.D.   On: 09/14/2020 18:10     Labs:   Basic Metabolic Panel: Recent Labs  Lab 09/15/20 0314 09/15/20 1520 09/15/20 1757 09/16/20 0147 09/17/20 0047 09/18/20 0120  NA 143 137  --  140 138 134*  K 2.8* 5.4*   < > 4.0 3.4* 3.8  CL 122* 109  --  112* 108 102  CO2 14* 22  --  22 23 24   GLUCOSE 208* 305*  --  177* 145* 129*  BUN 27* 28*  --  25* 15 11  CREATININE 0.49 0.76  --  0.70 0.74 0.78  CALCIUM 5.4* 8.4*  --  8.0* 8.0* 8.4*  MG 1.1* 2.8*  --  2.2 1.9 1.9  PHOS 2.0* 3.7  --  2.5 3.3 3.2   < > = values in this interval not displayed.   GFR Estimated Creatinine Clearance: 53.8 mL/min (by C-G formula based on SCr of 0.78 mg/dL). Liver Function Tests: Recent Labs  Lab 09/14/20 1730 09/15/20 0314 09/16/20 0147  AST 26 12* 12*  ALT 19 12 13   ALKPHOS 60 34*  54  BILITOT 0.6 0.2* 0.6  PROT 6.3* 3.6* 5.6*  ALBUMIN 2.3* 1.3* 2.1*   No results for input(s): LIPASE, AMYLASE in the last 168 hours. No results for input(s): AMMONIA in the last 168 hours. Coagulation profile Recent Labs  Lab 09/15/20 0314  INR 1.7*    CBC: Recent Labs  Lab 09/14/20 1730 09/15/20 0314 09/15/20 1520 09/16/20 0147 09/17/20 0047 09/18/20 0120  WBC 15.7* 8.0 14.7* 12.6* 9.2 9.0  NEUTROABS 13.8*  --   --   --   --   --   HGB 4.5* 4.3* 10.6* 8.6* 8.2* 9.1*  HCT 15.6* 14.4* 32.8* 25.5* 25.1* 28.1*  MCV 91.8 90.6 88.2 86.7 87.8 87.8  PLT 298 153 252 195 180 187   Cardiac Enzymes: No results for input(s): CKTOTAL, CKMB, CKMBINDEX, TROPONINI in the last 168 hours. BNP: Invalid input(s): POCBNP CBG: Recent Labs  Lab 09/17/20 0626 09/17/20 1236  09/17/20 1816 09/17/20 2221 09/18/20 0652  GLUCAP 117* 175* 192* 136* 128*   D-Dimer No results for input(s): DDIMER in the last 72 hours. Hgb A1c No results for input(s): HGBA1C in the last 72 hours. Lipid Profile No results for input(s): CHOL, HDL, LDLCALC, TRIG, CHOLHDL, LDLDIRECT in the last 72 hours. Thyroid function studies No results for input(s): TSH, T4TOTAL, T3FREE, THYROIDAB in the last 72 hours.  Invalid input(s): FREET3 Anemia work up No results for input(s): VITAMINB12, FOLATE, FERRITIN, TIBC, IRON, RETICCTPCT in the last 72 hours. Microbiology Recent Results (from the past 240 hour(s))  Urine culture     Status: None   Collection Time: 09/14/20  8:49 PM   Specimen: Urine, Catheterized  Result Value Ref Range Status   Specimen Description URINE, CATHETERIZED  Final   Special Requests NONE  Final   Culture   Final    NO GROWTH Performed at [facility], [address] [place], [place] [address]    Report Status 09/16/2020 FINAL  Final  SARS CORONAVIRUS 2 (TAT 6-24 HRS) Nasopharyngeal Nasopharyngeal Swab     Status: None   Collection Time: 09/15/20  6:58 PM   Specimen: Nasopharyngeal Swab  Result Value Ref Range Status   SARS Coronavirus 2 NEGATIVE NEGATIVE Final    Comment: (NOTE) SARS-CoV-2 target nucleic acids are NOT DETECTED.  The SARS-CoV-2 RNA is generally detectable in upper and lower respiratory specimens during the acute phase of infection. Negative results do not preclude SARS-CoV-2 infection, do not rule out co-infections with other pathogens, and should not be used as the sole basis for treatment or other patient management decisions. Negative results must be combined with clinical observations, patient history, and epidemiological information. The expected result is Negative.  Fact Sheet for Patients: SugarRoll.be  Fact Sheet for Healthcare Providers: https://www.woods-mathews.com/  This  test is not yet approved or cleared by the [place] FDA and  has been authorized for detection and/or diagnosis of SARS-CoV-2 by FDA under an Emergency Use Authorization (EUA). This EUA will remain  in effect (meaning this test can be used) for the duration of the COVID-19 declaration under Se ction 564(b)(1) of the Act, 21 U.S.C. section 360bbb-3(b)(1), unless the authorization is terminated or revoked sooner.  Performed at [facility], [place] [address] [place], [place] [address]      Signed: Terrilee Croak  [facility] 09/18/2020, 11:04 AM

## 2020-09-18 NOTE — Progress Notes (Signed)
Patient and grandson given discharge information, medication list and follow up appointments. Grandson verbalized understanding at this time. Patient IV and tele were dcd. Will discharge home as ordered. Transported to exit via wheel chair . Pege[place] Stiger, Bettina Gavia RN

## 2020-09-18 NOTE — Progress Notes (Signed)
Patient dressings to both feet changed prior to discharged as ordered. Family at bedside  [place], Bettina Gavia RN

## 2020-09-18 NOTE — Evaluation (Addendum)
Physical Therapy Evaluation Patient Details Name: [place] Moore MRN: [id number] DOB: 10-20-1945 Today's Date: 09/18/2020   History of Present Illness  Crystal Moore is a 75 y.o. female with medical history significant of diabetes, peripheral vascular disease, COPD, former  tobacco use disorder, history of CVA, OSA, dementia, gout, essential hypertension, recent LLE arteriogram and arterial stenting who presents with upper GI bleeding s/p APC and hemostatic clip placement.  Clinical Impression  Prior to admission, pt lives with her daughter, requires assist for ADL's from family/aide, and for limited household ambulation. Pt appears to be fairly close to her functional baseline. Requiring min assist for transfers and ambulating x 10 ft with handheld assist. Pt grandson able to demonstrate safe guarding technique with assisting pt with mobilizing. PT provided them with a gait belt. Pt displays generalized weakness (L side residually weak from prior stroke), balance deficits, and gait abnormalities. Would benefit from follow up HHPT to address deficits.     Follow Up Recommendations Home health PT;Supervision/Assistance - 24 hour    Equipment Recommendations  None recommended by PT    Recommendations for Other Services       Precautions / Restrictions Precautions Precautions: Fall Restrictions Weight Bearing Restrictions: No      Mobility  Bed Mobility Overal bed mobility: Needs Assistance Bed Mobility: Supine to Sit;Sit to Supine     Supine to sit: Mod assist Sit to supine: Min assist   General bed mobility comments: ModA to bring BLE's off edge of bed and trunk to upright. MinA to return LE's to bed    Transfers Overall transfer level: Needs assistance Equipment used: 1 person hand held assist Transfers: Sit to/from [facility] Sit to Stand: Min assist Stand pivot transfers: Min assist       General transfer comment: MinA to rise x 3 and pivot to [facility]  towards left  Ambulation/Gait Ambulation/Gait assistance: Min assist Gait Distance (Feet): 10 Feet Assistive device: 1 person hand held assist Gait Pattern/deviations: Decreased step length - right;Decreased stance time - left;Decreased dorsiflexion - left;Narrow base of support;Step-to pattern Gait velocity: decreased Gait velocity interpretation: <1.8 ft/sec, indicate of risk for recurrent falls General Gait Details: MinA for balance, tendency to drag LLE, shortened step to pattern  Stairs            Wheelchair Mobility    Modified Rankin (Stroke Patients Only)       Balance Overall balance assessment: Needs assistance Sitting-balance support: Feet supported Sitting balance-Leahy Scale: Fair     Standing balance support: Single extremity supported;During functional activity Standing balance-Leahy Scale: Poor Standing balance comment: reliant on single UE support                             Pertinent Vitals/Pain Pain Assessment: Faces Faces Pain Scale: No hurt    Home Living Family/patient expects to be discharged to:: Private residence Living Arrangements: Children (daughter) Available Help at Discharge: Family;Available 24 hours/day Type of Home: House Home Access: Stairs to enter   [employer] of Steps: 1 Home Layout: One level Home Equipment: Cane - single point;Wheelchair - manual      Prior Function Level of Independence: Needs assistance   Gait / Transfers Assistance Needed: hand hold or cane, daughter reports steadying her or holding on to her if needed at baseline to walk to the bathroom.  ADL's / Homemaking Assistance Needed: Needs assistance, has 24/7 assistance and an aide 8 hrs  a day. Washes at the sink.        Hand Dominance   Dominant Hand: Right    Extremity/Trunk Assessment   Upper Extremity Assessment Upper Extremity Assessment: RUE deficits/detail;LUE deficits/detail RUE Deficits / Details: WFL LUE  Deficits / Details: Residual deficits from prior stroke    Lower Extremity Assessment Lower Extremity Assessment: RLE deficits/detail;LLE deficits/detail RLE Deficits / Details: At least 3/5 strength LLE Deficits / Details: Residual deficits following stroke       Communication   Communication: Expressive difficulties  Cognition Arousal/Alertness: Awake/alert Behavior During Therapy: WFL for tasks assessed/performed Overall Cognitive Status: History of cognitive impairments - at baseline                                 General Comments: Pt follows 1 step commands with visual/tactile cues and incr time. Minimal verbalizations which is baseline      General Comments      Exercises     Assessment/Plan    PT Assessment Patient needs continued PT services  PT Problem List Decreased strength;Decreased mobility;Decreased activity tolerance;Decreased balance;Decreased knowledge of use of DME;Decreased skin integrity       PT Treatment Interventions Gait training;DME instruction;Therapeutic exercise;Balance training;Functional mobility training;Therapeutic activities;Patient/family education;Wheelchair mobility training    PT Goals (Current goals can be found in the Care Plan section)  Acute Rehab PT Goals Patient Stated Goal: unable to state PT Goal Formulation: With patient/family Time For Goal Achievement: 10/02/20 Potential to Achieve Goals: Fair    Frequency Min 3X/week   Barriers to discharge        Co-evaluation               AM-PAC PT "6 Clicks" Mobility  Outcome Measure Help needed turning from your back to your side while in a flat bed without using bedrails?: A Little Help needed moving from lying on your back to sitting on the side of a flat bed without using bedrails?: A Lot Help needed moving to and from a bed to a chair (including a wheelchair)?: A Little Help needed standing up from a chair using your arms (e.g., wheelchair or bedside  chair)?: A Little Help needed to walk in hospital room?: A Little Help needed climbing 3-5 steps with a railing? : A Lot 6 Click Score: 16    End of Session Equipment Utilized During Treatment: Gait belt Activity Tolerance: Patient tolerated treatment well Patient left: in bed;with call bell/phone within reach;with bed alarm set;with family/visitor present Nurse Communication: Mobility status PT Visit Diagnosis: Other abnormalities of gait and mobility (R26.89);Muscle weakness (generalized) (M62.81)    Time: 7782-4235 PT Time Calculation (min) (ACUTE ONLY): 33 min   Charges:   PT Evaluation $PT Eval Moderate Complexity: 1 Mod PT Treatments $Therapeutic Activity: 8-22 mins        Wyona Almas, PT, DPT Acute Rehabilitation Services Pager [phone] Office [phone]   Deno Etienne 09/18/2020, 1:14 PM

## 2020-09-24 ENCOUNTER — Other Ambulatory Visit: Payer: Self-pay

## 2020-09-24 ENCOUNTER — Ambulatory Visit (INDEPENDENT_AMBULATORY_CARE_PROVIDER_SITE_OTHER): Payer: Medicare Other | Admitting: Vascular Surgery

## 2020-09-24 ENCOUNTER — Ambulatory Visit (INDEPENDENT_AMBULATORY_CARE_PROVIDER_SITE_OTHER)
Admission: RE | Admit: 2020-09-24 | Discharge: 2020-09-24 | Disposition: A | Discharge status: Home / Self Care | Payer: Medicare Other | Source: Ambulatory Visit | Attending: Vascular Surgery | Admitting: Vascular Surgery

## 2020-09-24 ENCOUNTER — Encounter: Payer: Self-pay | Admitting: Vascular Surgery

## 2020-09-24 ENCOUNTER — Ambulatory Visit (HOSPITAL_COMMUNITY)
Admission: RE | Admit: 2020-09-24 | Discharge: 2020-09-24 | Disposition: A | Discharge status: Home / Self Care | Payer: Medicare Other | Source: Ambulatory Visit | Attending: Vascular Surgery | Admitting: Vascular Surgery

## 2020-09-24 VITALS — BP 108/62 | HR 71 | Temp 97.3°F | Resp 18 | Ht 62.0 in | Wt 134.0 lb

## 2020-09-24 DIAGNOSIS — E11622 Type 2 diabetes mellitus with other skin ulcer: Secondary | ICD-10-CM | POA: Diagnosis not present

## 2020-09-24 DIAGNOSIS — L03031 Cellulitis of right toe: Secondary | ICD-10-CM | POA: Diagnosis not present

## 2020-09-24 DIAGNOSIS — J9611 Chronic respiratory failure with hypoxia: Secondary | ICD-10-CM | POA: Diagnosis not present

## 2020-09-24 DIAGNOSIS — Z9889 Other specified postprocedural states: Secondary | ICD-10-CM

## 2020-09-24 DIAGNOSIS — L97512 Non-pressure chronic ulcer of other part of right foot with fat layer exposed: Secondary | ICD-10-CM | POA: Diagnosis not present

## 2020-09-24 DIAGNOSIS — L97321 Non-pressure chronic ulcer of left ankle limited to breakdown of skin: Secondary | ICD-10-CM | POA: Diagnosis not present

## 2020-09-24 DIAGNOSIS — R2681 Unsteadiness on feet: Secondary | ICD-10-CM | POA: Diagnosis not present

## 2020-09-24 DIAGNOSIS — D62 Acute posthemorrhagic anemia: Secondary | ICD-10-CM | POA: Diagnosis not present

## 2020-09-24 DIAGNOSIS — L97328 Non-pressure chronic ulcer of left ankle with other specified severity: Secondary | ICD-10-CM | POA: Diagnosis not present

## 2020-09-24 DIAGNOSIS — E1151 Type 2 diabetes mellitus with diabetic peripheral angiopathy without gangrene: Secondary | ICD-10-CM | POA: Diagnosis not present

## 2020-09-24 DIAGNOSIS — R531 Weakness: Secondary | ICD-10-CM | POA: Diagnosis not present

## 2020-09-24 DIAGNOSIS — L97511 Non-pressure chronic ulcer of other part of right foot limited to breakdown of skin: Secondary | ICD-10-CM | POA: Diagnosis not present

## 2020-09-24 DIAGNOSIS — Z794 Long term (current) use of insulin: Secondary | ICD-10-CM | POA: Diagnosis not present

## 2020-09-24 DIAGNOSIS — I739 Peripheral vascular disease, unspecified: Secondary | ICD-10-CM | POA: Diagnosis not present

## 2020-09-24 MED ORDER — CLOPIDOGREL BISULFATE 75 MG PO TABS: 75.0000 mg | ORAL_TABLET | Freq: Every day | ORAL | 11 refills | Status: AC

## 2020-09-24 NOTE — Progress Notes (Signed)
ASSESSMENT & PLAN:  75 y.o. female with atherosclerosis of native arteries of bilateral lower extremities causing gangrene status post:  08/30/20: right SFA / popliteal stenting (6x120 Eluvia x 3) 09/04/20: left SAFARI with SFA / popliteal stenting (47mm Innova stents x 4)  Both revascularizations are widely patent with normalization of the ABI.   Recommend the following which can slow the progression of atherosclerosis and reduce the risk of major adverse cardiac / limb events:  Complete cessation from all tobacco products. Blood glucose control with goal A1c < 7%. Blood pressure control with goal blood pressure < 140/90 mmHg. Lipid reduction therapy with goal LDL-C <100 mg/dL (<70 if symptomatic from PAD).  Aspirin 81mg  PO QD.  Clopidogrel 75mg  PO QD. Atorvastatin 40-80mg  PO QD (or other "high intensity" statin therapy).  Follow up with me in 6 months. Sooner should issues arise.  CHIEF COMPLAINT:   Post op  HISTORY:  HISTORY OF PRESENT ILLNESS: [place] Moore is a 75 y.o. female well known to me for whom I performed staged endovascular recanalizations of bilateral long segment SFA/popliteal lesions causing gangrene in early February 2021. At that time her ABI was R 0.5 / L 0.57. She suffered an upper GI bleed in late February. Her dual antiplatelet therapy was scaled back to ASA only. She returns to clinic for evaluation. She has no complaints.   Past Medical History:  Diagnosis Date  . Arthritis   . COPD (chronic obstructive pulmonary disease) ([place])   . COVID-19 virus infection 07/2020  . CVA (cerebral vascular accident) ([place])   . Diabetes mellitus without complication ([place])   . Hemiparesis affecting left side as late effect of cerebrovascular accident (CVA) ([place])   . Pyelonephritis   . Seizures ([place])   . Stroke ([place])   . Tobacco use disorder   . UTI (urinary tract infection)     Past Surgical History:  Procedure Laterality Date  . ABDOMINAL AORTOGRAM W/LOWER  EXTREMITY N/A 08/30/2020   Procedure: ABDOMINAL AORTOGRAM W/LOWER EXTREMITY;  Surgeon: Cherre Robins, MD;  Location: [place] [facility];  Service: Cardiovascular;  Laterality: N/A;  . ABDOMINAL AORTOGRAM W/LOWER EXTREMITY N/A 09/04/2020   Procedure: ABDOMINAL AORTOGRAM W/LOWER EXTREMITY;  Surgeon: Cherre Robins, MD;  Location: [place] [facility];  Service: Cardiovascular;  Laterality: N/A;  . ESOPHAGOGASTRODUODENOSCOPY (EGD) WITH PROPOFOL N/A 09/16/2020   Procedure: ESOPHAGOGASTRODUODENOSCOPY (EGD) WITH PROPOFOL;  Surgeon: Lavena Bullion, DO;  Location: [place];  Service: Gastroenterology;  Laterality: N/A;  . HEMOSTASIS CLIP PLACEMENT  09/16/2020   Procedure: HEMOSTASIS CLIP PLACEMENT;  Surgeon: Lavena Bullion, DO;  Location: [place];  Service: Gastroenterology;;  . HOT HEMOSTASIS N/A 09/16/2020   Procedure: HOT HEMOSTASIS (ARGON PLASMA COAGULATION/BICAP);  Surgeon: Lavena Bullion, DO;  Location: [facility];  Service: Gastroenterology;  Laterality: N/A;  . PERIPHERAL VASCULAR INTERVENTION Right 08/30/2020   Procedure: PERIPHERAL VASCULAR INTERVENTION;  Surgeon: Cherre Robins, MD;  Location: [place] [facility];  Service: Cardiovascular;  Laterality: Right;  femoral popliteal  . PERIPHERAL VASCULAR INTERVENTION Left 09/04/2020   Procedure: PERIPHERAL VASCULAR INTERVENTION;  Surgeon: Cherre Robins, MD;  Location: [place] [facility];  Service: Cardiovascular;  Laterality: Left;  SFA  . TUBAL LIGATION      Family History  Problem Relation Age of Onset  . Diabetes Mother   . Diabetes Sister   . Diabetes Brother     Social History   Socioeconomic History  . Marital status: Single  Spouse name: Not on file  . Number of children: Not on file  . Years of education: Not on file  . Highest education level: Not on file  Occupational History  . Not on file  Tobacco Use  . Smoking status: Current Every Day Smoker    Packs/day: 1.00    Years: 40.00    Pack years:  40.00    Types: Cigarettes  . Smokeless tobacco: Never Used  Vaping Use  . Vaping Use: Never used  Substance and Sexual Activity  . Alcohol use: Never  . Drug use: Never  . Sexual activity: Not on file  Other Topics Concern  . Not on file  Social History Narrative   ** Merged History Encounter **   Right handed    Lives with family        Social Determinants of Health   Financial Resource Strain: Not on file  Food Insecurity: Not on file  Transportation Needs: Not on file  Physical Activity: Not on file  Stress: Not on file  Social Connections: Not on file  Intimate Partner Violence: Not on file    No Known Allergies  Current Outpatient Medications  Medication Sig Dispense Refill  . acetaminophen (TYLENOL) 500 MG tablet Take 1,000 mg by mouth every 6 (six) hours as needed (pain).    Marland Kitchen albuterol (PROVENTIL) (2.5 MG/3ML) 0.083% nebulizer solution Inhale 3 mLs (2.5 mg total) into the lungs every 4 (four) hours as needed for wheezing or shortness of breath. 75 mL 12  . albuterol (VENTOLIN HFA) 108 (90 Base) MCG/ACT inhaler Inhale 1 puff into the lungs every 6 (six) hours as needed for wheezing or shortness of breath.    Marland Kitchen aspirin 81 MG EC tablet Take 81 mg by mouth daily.    Marland Kitchen atorvastatin (LIPITOR) 80 MG tablet Take 1 tablet (80 mg total) by mouth daily at 6 PM. (Patient taking differently: Take 80 mg by mouth daily.) 30 tablet 0  . feeding supplement (ENSURE ENLIVE / ENSURE PLUS) LIQD Take 237 mLs by mouth 2 (two) times daily between meals. 237 mL 12  . gabapentin (NEURONTIN) 300 MG capsule Take 1 capsule (300 mg total) by mouth 3 (three) times daily. 30 capsule 3  . glucose blood (ACCU-CHEK GUIDE) test strip 1 each by Other route in the morning and at bedtime. And lancets 2/day 200 each 3  . insulin glargine (LANTUS) 100 UNIT/ML injection Inject 0.15 mLs (15 Units total) into the skin daily. 10 mL 11  . linagliptin (TRADJENTA) 5 MG TABS tablet Take 5 mg by mouth daily.    .  pantoprazole (PROTONIX) 40 MG tablet Take 1 tablet (40 mg total) by mouth 2 (two) times daily. 84 tablet 0   No current facility-administered medications for this visit.    REVIEW OF SYSTEMS:  [X]  denotes positive finding, [ ]  denotes negative finding Cardiac  Comments:  Chest pain or chest pressure:    Shortness of breath upon exertion:    Short of breath when lying flat:    Irregular heart rhythm:        Vascular    Pain in calf, thigh, or hip brought on by ambulation:    Pain in feet at night that wakes you up from your sleep:     Blood clot in your veins:    Leg swelling:         Pulmonary    Oxygen at home:    Productive cough:     Wheezing:  Neurologic    Sudden weakness in arms or legs:     Sudden numbness in arms or legs:     Sudden onset of difficulty speaking or slurred speech:    Temporary loss of vision in one eye:     Problems with dizziness:         Gastrointestinal    Blood in stool:     Vomited blood:         Genitourinary    Burning when urinating:     Blood in urine:        Psychiatric    Major depression:         Hematologic    Bleeding problems:    Problems with blood clotting too easily:        Skin    Rashes or ulcers:        Constitutional    Fever or chills:     PHYSICAL EXAM:   Vitals:   09/24/20 1548  BP: 108/62  Pulse: 71  Resp: 18  Temp: (!) 97.3 F (36.3 C)  SpO2: 94%  Weight: 134 lb (60.8 kg)  Height: 5\' 2"  (1.575 m)   Constitutional: well appearing in no distress. Appears well nourished.  Neurologic: CN intact. no focal findings. no sensory loss. Psychiatric: Mood and affect symmetric and appropriate. Eyes: No icterus. No conjunctival pallor. Ears, nose, throat: mucous membranes moist. Midline trachea.  Cardiac: regular rate and rhythm.  Respiratory: unlabored. Abdominal: soft, non-tender, non-distended.  Peripheral vascular:  No pedal pulses  Warm well perfused feet  Healing interdigital ulcer of L 1/2  toe  Healing lateral R ankle wound Extremity: No edema. No cyanosis. No pallor.  Skin: No gangrene. No ulceration.  Lymphatic: No Stemmer's sign. No palpable lymphadenopathy.   DATA REVIEW:    Most recent CBC CBC Latest Ref Rng & Units 09/18/2020 09/17/2020 09/16/2020  WBC 4.0 - 10.5 K/uL 9.0 9.2 12.6(H)  Hemoglobin 12.0 - 15.0 g/dL 9.1(L) 8.2(L) 8.6(L)  Hematocrit 36.0 - 46.0 % 28.1(L) 25.1(L) 25.5(L)  Platelets 150 - 400 K/uL 187 180 195     Most recent CMP CMP Latest Ref Rng & Units 09/18/2020 09/17/2020 09/16/2020  Glucose 70 - 99 mg/dL 129(H) 145(H) 177(H)  BUN 8 - 23 mg/dL 11 15 25(H)  Creatinine 0.44 - 1.00 mg/dL 0.78 0.74 0.70  Sodium 135 - 145 mmol/L 134(L) 138 140  Potassium 3.5 - 5.1 mmol/L 3.8 3.4(L) 4.0  Chloride 98 - 111 mmol/L 102 108 112(H)  CO2 22 - 32 mmol/L 24 23 22   Calcium 8.9 - 10.3 mg/dL 8.4(L) 8.0(L) 8.0(L)  Total Protein 6.5 - 8.1 g/dL - - 5.6(L)  Total Bilirubin 0.3 - 1.2 mg/dL - - 0.6  Alkaline Phos 38 - 126 U/L - - 54  AST 15 - 41 U/L - - 12(L)  ALT 0 - 44 U/L - - 13    Renal function Estimated Creatinine Clearance: 53.8 mL/min (by C-G formula based on SCr of 0.78 mg/dL).  Hgb A1c MFr Bld (%)  Date Value  08/27/2020 10.1 (H)    LDL Cholesterol  Date Value Ref Range Status  12/18/2018 100 (H) 0 - 99 mg/dL Final    Comment:           Total Cholesterol/HDL:CHD Risk Coronary Heart Disease Risk Table                     Men   Women  1/2 Average Risk   3.4  3.3  Average Risk       5.0   4.4  2 X Average Risk   9.6   7.1  3 X Average Risk  23.4   11.0        Use the calculated Patient Ratio above and the CHD Risk Table to determine the patient's CHD Risk.        ATP III CLASSIFICATION (LDL):  <100     mg/dL   Optimal  100-129  mg/dL   Near or Above                    Optimal  130-159  mg/dL   Borderline  160-189  mg/dL   High  >190     mg/dL   Very High Performed at [facility] [address] [place], [place] [address]       Vascular Imaging: ABI 09/24/20   BLE Arterial Duplex 09/24/20 No flow limiting stenosis in either revascularization   [name], MD [facility] Phone Number: [phone] 09/24/2020 3:21 PM

## 2020-09-25 DIAGNOSIS — K922 Gastrointestinal hemorrhage, unspecified: Secondary | ICD-10-CM | POA: Diagnosis not present

## 2020-09-25 DIAGNOSIS — I10 Essential (primary) hypertension: Secondary | ICD-10-CM | POA: Diagnosis not present

## 2020-09-25 DIAGNOSIS — D62 Acute posthemorrhagic anemia: Secondary | ICD-10-CM | POA: Diagnosis not present

## 2020-09-25 DIAGNOSIS — L97328 Non-pressure chronic ulcer of left ankle with other specified severity: Secondary | ICD-10-CM | POA: Diagnosis not present

## 2020-09-25 DIAGNOSIS — L97511 Non-pressure chronic ulcer of other part of right foot limited to breakdown of skin: Secondary | ICD-10-CM | POA: Diagnosis not present

## 2020-09-25 DIAGNOSIS — R531 Weakness: Secondary | ICD-10-CM | POA: Diagnosis not present

## 2020-09-25 DIAGNOSIS — J9611 Chronic respiratory failure with hypoxia: Secondary | ICD-10-CM | POA: Diagnosis not present

## 2020-09-25 DIAGNOSIS — E1151 Type 2 diabetes mellitus with diabetic peripheral angiopathy without gangrene: Secondary | ICD-10-CM | POA: Diagnosis not present

## 2020-09-25 DIAGNOSIS — R2681 Unsteadiness on feet: Secondary | ICD-10-CM | POA: Diagnosis not present

## 2020-09-25 DIAGNOSIS — L97512 Non-pressure chronic ulcer of other part of right foot with fat layer exposed: Secondary | ICD-10-CM | POA: Diagnosis not present

## 2020-09-25 DIAGNOSIS — J449 Chronic obstructive pulmonary disease, unspecified: Secondary | ICD-10-CM | POA: Diagnosis not present

## 2020-09-25 DIAGNOSIS — Z794 Long term (current) use of insulin: Secondary | ICD-10-CM | POA: Diagnosis not present

## 2020-09-25 DIAGNOSIS — E11622 Type 2 diabetes mellitus with other skin ulcer: Secondary | ICD-10-CM | POA: Diagnosis not present

## 2020-09-25 DIAGNOSIS — E1139 Type 2 diabetes mellitus with other diabetic ophthalmic complication: Secondary | ICD-10-CM | POA: Diagnosis not present

## 2020-09-25 DIAGNOSIS — I739 Peripheral vascular disease, unspecified: Secondary | ICD-10-CM | POA: Diagnosis not present

## 2020-09-26 DIAGNOSIS — J439 Emphysema, unspecified: Secondary | ICD-10-CM | POA: Diagnosis not present

## 2020-09-27 DIAGNOSIS — E11622 Type 2 diabetes mellitus with other skin ulcer: Secondary | ICD-10-CM | POA: Diagnosis not present

## 2020-09-27 DIAGNOSIS — L97328 Non-pressure chronic ulcer of left ankle with other specified severity: Secondary | ICD-10-CM | POA: Diagnosis not present

## 2020-09-30 DIAGNOSIS — D62 Acute posthemorrhagic anemia: Secondary | ICD-10-CM | POA: Diagnosis not present

## 2020-09-30 DIAGNOSIS — E11622 Type 2 diabetes mellitus with other skin ulcer: Secondary | ICD-10-CM | POA: Diagnosis not present

## 2020-09-30 DIAGNOSIS — J9611 Chronic respiratory failure with hypoxia: Secondary | ICD-10-CM | POA: Diagnosis not present

## 2020-09-30 DIAGNOSIS — R531 Weakness: Secondary | ICD-10-CM | POA: Diagnosis not present

## 2020-09-30 DIAGNOSIS — L97512 Non-pressure chronic ulcer of other part of right foot with fat layer exposed: Secondary | ICD-10-CM | POA: Diagnosis not present

## 2020-09-30 DIAGNOSIS — L97328 Non-pressure chronic ulcer of left ankle with other specified severity: Secondary | ICD-10-CM | POA: Diagnosis not present

## 2020-09-30 DIAGNOSIS — E1151 Type 2 diabetes mellitus with diabetic peripheral angiopathy without gangrene: Secondary | ICD-10-CM | POA: Diagnosis not present

## 2020-09-30 DIAGNOSIS — L97511 Non-pressure chronic ulcer of other part of right foot limited to breakdown of skin: Secondary | ICD-10-CM | POA: Diagnosis not present

## 2020-09-30 DIAGNOSIS — I739 Peripheral vascular disease, unspecified: Secondary | ICD-10-CM | POA: Diagnosis not present

## 2020-09-30 DIAGNOSIS — R2681 Unsteadiness on feet: Secondary | ICD-10-CM | POA: Diagnosis not present

## 2020-09-30 DIAGNOSIS — Z794 Long term (current) use of insulin: Secondary | ICD-10-CM | POA: Diagnosis not present

## 2020-10-01 ENCOUNTER — Encounter (HOSPITAL_COMMUNITY): Payer: Medicare Other

## 2020-10-01 ENCOUNTER — Encounter: Payer: Medicare Other | Admitting: Vascular Surgery

## 2020-10-02 DIAGNOSIS — L97512 Non-pressure chronic ulcer of other part of right foot with fat layer exposed: Secondary | ICD-10-CM | POA: Diagnosis not present

## 2020-10-02 DIAGNOSIS — L97511 Non-pressure chronic ulcer of other part of right foot limited to breakdown of skin: Secondary | ICD-10-CM | POA: Diagnosis not present

## 2020-10-02 DIAGNOSIS — R2681 Unsteadiness on feet: Secondary | ICD-10-CM | POA: Diagnosis not present

## 2020-10-02 DIAGNOSIS — E1151 Type 2 diabetes mellitus with diabetic peripheral angiopathy without gangrene: Secondary | ICD-10-CM | POA: Diagnosis not present

## 2020-10-02 DIAGNOSIS — J9611 Chronic respiratory failure with hypoxia: Secondary | ICD-10-CM | POA: Diagnosis not present

## 2020-10-02 DIAGNOSIS — E11622 Type 2 diabetes mellitus with other skin ulcer: Secondary | ICD-10-CM | POA: Diagnosis not present

## 2020-10-02 DIAGNOSIS — R531 Weakness: Secondary | ICD-10-CM | POA: Diagnosis not present

## 2020-10-02 DIAGNOSIS — L97328 Non-pressure chronic ulcer of left ankle with other specified severity: Secondary | ICD-10-CM | POA: Diagnosis not present

## 2020-10-02 DIAGNOSIS — D62 Acute posthemorrhagic anemia: Secondary | ICD-10-CM | POA: Diagnosis not present

## 2020-10-02 DIAGNOSIS — I739 Peripheral vascular disease, unspecified: Secondary | ICD-10-CM | POA: Diagnosis not present

## 2020-10-02 DIAGNOSIS — Z794 Long term (current) use of insulin: Secondary | ICD-10-CM | POA: Diagnosis not present

## 2020-10-04 DIAGNOSIS — I739 Peripheral vascular disease, unspecified: Secondary | ICD-10-CM | POA: Diagnosis not present

## 2020-10-04 DIAGNOSIS — E1151 Type 2 diabetes mellitus with diabetic peripheral angiopathy without gangrene: Secondary | ICD-10-CM | POA: Diagnosis not present

## 2020-10-04 DIAGNOSIS — L97511 Non-pressure chronic ulcer of other part of right foot limited to breakdown of skin: Secondary | ICD-10-CM | POA: Diagnosis not present

## 2020-10-04 DIAGNOSIS — L97512 Non-pressure chronic ulcer of other part of right foot with fat layer exposed: Secondary | ICD-10-CM | POA: Diagnosis not present

## 2020-10-04 DIAGNOSIS — D62 Acute posthemorrhagic anemia: Secondary | ICD-10-CM | POA: Diagnosis not present

## 2020-10-04 DIAGNOSIS — R531 Weakness: Secondary | ICD-10-CM | POA: Diagnosis not present

## 2020-10-04 DIAGNOSIS — R2681 Unsteadiness on feet: Secondary | ICD-10-CM | POA: Diagnosis not present

## 2020-10-04 DIAGNOSIS — Z794 Long term (current) use of insulin: Secondary | ICD-10-CM | POA: Diagnosis not present

## 2020-10-04 DIAGNOSIS — L97328 Non-pressure chronic ulcer of left ankle with other specified severity: Secondary | ICD-10-CM | POA: Diagnosis not present

## 2020-10-04 DIAGNOSIS — J9611 Chronic respiratory failure with hypoxia: Secondary | ICD-10-CM | POA: Diagnosis not present

## 2020-10-04 DIAGNOSIS — E11622 Type 2 diabetes mellitus with other skin ulcer: Secondary | ICD-10-CM | POA: Diagnosis not present

## 2020-10-07 DIAGNOSIS — L97511 Non-pressure chronic ulcer of other part of right foot limited to breakdown of skin: Secondary | ICD-10-CM | POA: Diagnosis not present

## 2020-10-07 DIAGNOSIS — L97512 Non-pressure chronic ulcer of other part of right foot with fat layer exposed: Secondary | ICD-10-CM | POA: Diagnosis not present

## 2020-10-07 DIAGNOSIS — R531 Weakness: Secondary | ICD-10-CM | POA: Diagnosis not present

## 2020-10-07 DIAGNOSIS — E1151 Type 2 diabetes mellitus with diabetic peripheral angiopathy without gangrene: Secondary | ICD-10-CM | POA: Diagnosis not present

## 2020-10-07 DIAGNOSIS — L97328 Non-pressure chronic ulcer of left ankle with other specified severity: Secondary | ICD-10-CM | POA: Diagnosis not present

## 2020-10-07 DIAGNOSIS — J9611 Chronic respiratory failure with hypoxia: Secondary | ICD-10-CM | POA: Diagnosis not present

## 2020-10-07 DIAGNOSIS — E11622 Type 2 diabetes mellitus with other skin ulcer: Secondary | ICD-10-CM | POA: Diagnosis not present

## 2020-10-07 DIAGNOSIS — I739 Peripheral vascular disease, unspecified: Secondary | ICD-10-CM | POA: Diagnosis not present

## 2020-10-07 DIAGNOSIS — D62 Acute posthemorrhagic anemia: Secondary | ICD-10-CM | POA: Diagnosis not present

## 2020-10-07 DIAGNOSIS — Z794 Long term (current) use of insulin: Secondary | ICD-10-CM | POA: Diagnosis not present

## 2020-10-07 DIAGNOSIS — R2681 Unsteadiness on feet: Secondary | ICD-10-CM | POA: Diagnosis not present

## 2020-10-08 ENCOUNTER — Other Ambulatory Visit: Payer: Self-pay

## 2020-10-08 ENCOUNTER — Encounter (HOSPITAL_BASED_OUTPATIENT_CLINIC_OR_DEPARTMENT_OTHER): Payer: Medicare Other | Attending: Internal Medicine | Admitting: Internal Medicine

## 2020-10-08 DIAGNOSIS — I70243 Atherosclerosis of native arteries of left leg with ulceration of ankle: Secondary | ICD-10-CM | POA: Diagnosis not present

## 2020-10-08 DIAGNOSIS — Z87891 Personal history of nicotine dependence: Secondary | ICD-10-CM | POA: Insufficient documentation

## 2020-10-08 DIAGNOSIS — L97511 Non-pressure chronic ulcer of other part of right foot limited to breakdown of skin: Secondary | ICD-10-CM | POA: Insufficient documentation

## 2020-10-08 DIAGNOSIS — E1151 Type 2 diabetes mellitus with diabetic peripheral angiopathy without gangrene: Secondary | ICD-10-CM | POA: Insufficient documentation

## 2020-10-08 DIAGNOSIS — Z794 Long term (current) use of insulin: Secondary | ICD-10-CM | POA: Diagnosis not present

## 2020-10-08 DIAGNOSIS — L97322 Non-pressure chronic ulcer of left ankle with fat layer exposed: Secondary | ICD-10-CM | POA: Diagnosis not present

## 2020-10-08 DIAGNOSIS — E11621 Type 2 diabetes mellitus with foot ulcer: Secondary | ICD-10-CM | POA: Insufficient documentation

## 2020-10-08 DIAGNOSIS — G40909 Epilepsy, unspecified, not intractable, without status epilepticus: Secondary | ICD-10-CM | POA: Diagnosis not present

## 2020-10-08 DIAGNOSIS — L97328 Non-pressure chronic ulcer of left ankle with other specified severity: Secondary | ICD-10-CM | POA: Insufficient documentation

## 2020-10-08 DIAGNOSIS — F0150 Vascular dementia without behavioral disturbance: Secondary | ICD-10-CM | POA: Insufficient documentation

## 2020-10-08 DIAGNOSIS — E11622 Type 2 diabetes mellitus with other skin ulcer: Secondary | ICD-10-CM | POA: Insufficient documentation

## 2020-10-09 DIAGNOSIS — D62 Acute posthemorrhagic anemia: Secondary | ICD-10-CM | POA: Diagnosis not present

## 2020-10-09 DIAGNOSIS — L97328 Non-pressure chronic ulcer of left ankle with other specified severity: Secondary | ICD-10-CM | POA: Diagnosis not present

## 2020-10-09 DIAGNOSIS — I739 Peripheral vascular disease, unspecified: Secondary | ICD-10-CM | POA: Diagnosis not present

## 2020-10-09 DIAGNOSIS — Z794 Long term (current) use of insulin: Secondary | ICD-10-CM | POA: Diagnosis not present

## 2020-10-09 DIAGNOSIS — J9611 Chronic respiratory failure with hypoxia: Secondary | ICD-10-CM | POA: Diagnosis not present

## 2020-10-09 DIAGNOSIS — E11622 Type 2 diabetes mellitus with other skin ulcer: Secondary | ICD-10-CM | POA: Diagnosis not present

## 2020-10-09 DIAGNOSIS — L97512 Non-pressure chronic ulcer of other part of right foot with fat layer exposed: Secondary | ICD-10-CM | POA: Diagnosis not present

## 2020-10-09 DIAGNOSIS — E1151 Type 2 diabetes mellitus with diabetic peripheral angiopathy without gangrene: Secondary | ICD-10-CM | POA: Diagnosis not present

## 2020-10-09 DIAGNOSIS — R531 Weakness: Secondary | ICD-10-CM | POA: Diagnosis not present

## 2020-10-09 DIAGNOSIS — R2681 Unsteadiness on feet: Secondary | ICD-10-CM | POA: Diagnosis not present

## 2020-10-09 NOTE — Progress Notes (Signed)
HARVEST, DEIST ([id number]) Visit Report for 10/08/2020 Debridement Details Patient Name: Date of Service: LUCCIA, REINHEIMER. 10/08/2020 10:15 A M Medical Record Number: [id number] Patient Account Number: [id number] Date of Birth/Sex: Treating RN: 1945/09/17 (75 y.o. [name], [name] Primary Care Provider: [name] Other Clinician: Referring Provider: Treating Provider/Extender: Gabriel Rainwater in Treatment: 8 Debridement Performed for Assessment: Wound #1 Left,Lateral Malleolus Performed By: Physician Ricard Dillon., MD Debridement Type: Debridement Severity of Tissue Pre Debridement: Fat layer exposed Level of Consciousness (Pre-procedure): Awake and Alert Pre-procedure Verification/Time Out Yes - 11:23 Taken: Start Time: 11:23 Pain Control: Lidocaine T Area Debrided (L x W): otal 0.5 (cm) x 0.4 (cm) = 0.2 (cm) Tissue and other material debrided: Viable, Non-Viable, Slough, Skin: Dermis , Skin: Epidermis, Slough Level: Skin/Epidermis Debridement Description: Selective/Open Wound Instrument: Curette Bleeding: Minimum Hemostasis Achieved: Pressure End Time: 11:24 Procedural Pain: 0 Post Procedural Pain: 0 Response to Treatment: Procedure was tolerated well Level of Consciousness (Post- Awake and Alert procedure): Post Debridement Measurements of Total Wound Length: (cm) 0.5 Width: (cm) 0.4 Depth: (cm) 0.1 Volume: (cm) 0.016 Character of Wound/Ulcer Post Debridement: Improved Severity of Tissue Post Debridement: Fat layer exposed Post Procedure Diagnosis Same as Pre-procedure Electronic Signature(s) Signed: 10/08/2020 5:48:16 PM By: Linton Ham MD Signed: 10/09/2020 5:17:38 PM By: Rhae Hammock RN Entered By: Linton Ham on 10/08/2020 11:49:24 -------------------------------------------------------------------------------- HPI Details Patient Name: Date of Service: [name] 10/08/2020 10:15 A  M Medical Record Number: [id number] Patient Account Number: [id number] Date of Birth/Sex: Treating RN: 07/19/1946 (75 y.o. [name], [name] Primary Care Provider: [name] Other Clinician: Referring Provider: Treating Provider/Extender: Gabriel Rainwater in Treatment: 8 History of Present Illness HPI Description: ADMISSION 08/08/2020 This is a 75 year old woman who is disabled largely because of a history of stroke and vascular dementia. She is a type II diabetic on insulin and although she follows with endocrinology we could not see her recent hemoglobin A1c. She has an area on her left lateral malleolus that has been present since September. Not exactly sure how this started however certainly not it is not progressed towards healing. She has a more recent area on the right upper mid tibia. This does not look as bad and may have been secondary to minor trauma although the history here is not really exact. The patient is minimally ambulatory. Recently moved here from [facility] about a year ago just settling in. She lives with her daughter however daughter works and her grandson brought her into the clinic today. They have been using AandE ointment on the wounds. There have been some suggestion about Neosporin until she got here but they have continued with the AandE ointment Past medical history includes; vascular dementia, history of CVA leaving her mostly aphasic, type 2 diabetes on insulin, COPD, seizure disorder. She stopped smoking about 2 years ago. ABIs in our clinic were 0.5 on the right and 0.37 on the left 1/20; major wound is over the left lateral malleolus I think this is an ischemic wound. We have had trouble making connections with the family with regards to the messages although they do have home health going in but we have not yet been able to arrange her arterial test. Small wound on the right anterior upper tibia area looks  actually somewhat better this week. 1/27; the patient went for her noninvasive ARTERIAL studies on the right her ABI was 0.5 with a TBI of 0.28 segmental pressures showed a  significant drop in pressure from the proximal thigh to the distal thigh. Her great toe pressure was only 46. On the left her ABI was 0.57 on the left her TBI was 0.33. Again a similar drop in pressure from the level of the proximal thigh to the distal thigh. CT angiogram or angiogram was recommended. 3/15; its been a little less than 2 months since the patient was here. Since she is been here she was admitted to hospital from 1/31 through 2/11. She underwent right SFA stenting and then subsequently had stents placed in the left SFA and left popliteal.. She appears to have better perfusion in the area on the right knee is closed she still has the small area on the left lateral malleolus. Her grandson is concerned about the area on the right medial heel. There is no open wound here but might reflect the fact that she is not able to keep pressure off this area. She is apparently known to remove her pressure relief booties at night and it is difficult to keep her heels off the bed Electronic Signature(s) Signed: 10/08/2020 5:48:16 PM By: Linton Ham MD Entered By: Linton Ham on 10/08/2020 11:53:07 -------------------------------------------------------------------------------- Physical Exam Details Patient Name: Date of Service: [name] 10/08/2020 10:15 A M Medical Record Number: [id number] Patient Account Number: [id number] Date of Birth/Sex: Treating RN: 1946-01-10 (75 y.o. [name], [name] Primary Care Provider: [name] Other Clinician: Referring Provider: Treating Provider/Extender: Gabriel Rainwater in Treatment: 8 Constitutional Sitting or standing Blood Pressure is within target range for patient.. Pulse regular and within target range for patient.Marland Kitchen  Respirations regular, non-labored and within target range.. Temperature is normal and within the target range for the patient.Marland Kitchen Appears in no distress. Cardiovascular Has palpable dorsalis pedis and posterior tibial pulses on the left the right was a little more difficult to feel.. Notes Wound exam; the area on the left lateral malleolus actually looks quite a bit better. Some surface slough removed with a #3 curette. There is still some depth to this in the middle of the wound but it does not probe to bone. Most of the granulation looks healthy. No evidence of infection Electronic Signature(s) Signed: 10/08/2020 5:48:16 PM By: Linton Ham MD Entered By: Linton Ham on 10/08/2020 11:54:14 -------------------------------------------------------------------------------- Physician Orders Details Patient Name: Date of Service: [name] 10/08/2020 10:15 A M Medical Record Number: [id number] Patient Account Number: [id number] Date of Birth/Sex: Treating RN: Feb 07, 1946 (75 y.o. Benjaman Lobe Primary Care Provider: [name] Other Clinician: Referring Provider: Treating Provider/Extender: Gabriel Rainwater in Treatment: 8 Verbal / Phone Orders: No Diagnosis Coding Follow-up Appointments Return Appointment in 2 weeks. Bathing/ Shower/ Hygiene May shower with protection but do not get wound dressing(s) wet. - with not dressing changes only. May shower and wash wound with soap and water. - may wash with soap and water with dressing change. Edema Control - Lymphedema / SCD / Other Elevate legs to the level of the heart or above for 30 minutes daily and/or when sitting, a frequency of: - throughout the day. Avoid standing for long periods of time. Off-Loading Other: - ensure no pressure to left ankle wound. wear boots to relieve pressure from heels. Home Health New wound care orders this week; continue Home Health for wound care.  May utilize formulary equivalent dressing for wound treatment orders unless otherwise specified. - Home health to change twice a week. Wound Treatment Wound #1 - Malleolus Wound Laterality: Left,  Lateral Cleanser: Soap and Water [facility]) Every Other Day/30 Days Discharge Instructions: May shower and wash wound with dial antibacterial soap and water prior to dressing change. Peri-Wound Care: Skin Prep [facility]) Every Other Day/30 Days Discharge Instructions: Use skin prep as directed Prim Dressing: Endoform 2x2 in [facility]) Every Other Day/30 Days ary Discharge Instructions: Moisten with saline Secondary Dressing: Bordered Gauze, 4x4 in [facility]) Every Other Day/30 Days Discharge Instructions: or bordered foam. Apply over primary dressing as directed. Electronic Signature(s) Signed: 10/08/2020 5:48:16 PM By: Linton Ham MD Signed: 10/09/2020 5:17:38 PM By: Rhae Hammock RN Entered By: Rhae Hammock on 10/08/2020 11:30:25 -------------------------------------------------------------------------------- Problem List Details Patient Name: Date of Service: [name] 10/08/2020 10:15 A M Medical Record Number: [id number] Patient Account Number: [id number] Date of Birth/Sex: Treating RN: 09/29/1945 (75 y.o. [name], [name] Primary Care Provider: [name] Other Clinician: Referring Provider: Treating Provider/Extender: Gabriel Rainwater in Treatment: 8 Active Problems ICD-10 Encounter Code Description Active Date MDM Diagnosis E11.622 Type 2 diabetes mellitus with other skin ulcer 08/08/2020 No Yes I73.9 Peripheral vascular disease, unspecified 08/08/2020 No Yes E11.51 Type 2 diabetes mellitus with diabetic peripheral angiopathy without gangrene 08/08/2020 No Yes L97.328 Non-pressure chronic ulcer of left ankle with other specified severity 08/08/2020 No Yes L97.511 Non-pressure chronic ulcer of other part of  right foot limited to breakdown of 08/08/2020 No Yes skin Inactive Problems Resolved Problems Electronic Signature(s) Signed: 10/08/2020 5:48:16 PM By: Linton Ham MD Entered By: Linton Ham on 10/08/2020 11:48:52 -------------------------------------------------------------------------------- Progress Note Details Patient Name: Date of Service: [name] 10/08/2020 10:15 A M Medical Record Number: [id number] Patient Account Number: [id number] Date of Birth/Sex: Treating RN: April 07, 1946 (75 y.o. [name], [name] Primary Care Provider: [name] Other Clinician: Referring Provider: Treating Provider/Extender: Gabriel Rainwater in Treatment: 8 Subjective History of Present Illness (HPI) ADMISSION 08/08/2020 This is a 75 year old woman who is disabled largely because of a history of stroke and vascular dementia. She is a type II diabetic on insulin and although she follows with endocrinology we could not see her recent hemoglobin A1c. She has an area on her left lateral malleolus that has been present since September. Not exactly sure how this started however certainly not it is not progressed towards healing. She has a more recent area on the right upper mid tibia. This does not look as bad and may have been secondary to minor trauma although the history here is not really exact. The patient is minimally ambulatory. Recently moved here from [facility] about a year ago just settling in. She lives with her daughter however daughter works and her grandson brought her into the clinic today. They have been using AandE ointment on the wounds. There have been some suggestion about Neosporin until she got here but they have continued with the AandE ointment Past medical history includes; vascular dementia, history of CVA leaving her mostly aphasic, type 2 diabetes on insulin, COPD, seizure disorder. She stopped smoking about 2 years  ago. ABIs in our clinic were 0.5 on the right and 0.37 on the left 1/20; major wound is over the left lateral malleolus I think this is an ischemic wound. We have had trouble making connections with the family with regards to the messages although they do have home health going in but we have not yet been able to arrange her arterial test. Small wound on the right anterior upper tibia area looks actually somewhat better this  week. 1/27; the patient went for her noninvasive ARTERIAL studies on the right her ABI was 0.5 with a TBI of 0.28 segmental pressures showed a significant drop in pressure from the proximal thigh to the distal thigh. Her great toe pressure was only 46. On the left her ABI was 0.57 on the left her TBI was 0.33. Again a similar drop in pressure from the level of the proximal thigh to the distal thigh. CT angiogram or angiogram was recommended. 3/15; its been a little less than 2 months since the patient was here. Since she is been here she was admitted to hospital from 1/31 through 2/11. She underwent right SFA stenting and then subsequently had stents placed in the left SFA and left popliteal.. She appears to have better perfusion in the area on the right knee is closed she still has the small area on the left lateral malleolus. Her grandson is concerned about the area on the right medial heel. There is no open wound here but might reflect the fact that she is not able to keep pressure off this area. She is apparently known to remove her pressure relief booties at night and it is difficult to keep her heels off the bed Objective Constitutional Sitting or standing Blood Pressure is within target range for patient.. Pulse regular and within target range for patient.Marland Kitchen Respirations regular, non-labored and within target range.. Temperature is normal and within the target range for the patient.Marland Kitchen Appears in no distress. Vitals Time Taken: 10:48 AM, Temperature: 97.6 F, Pulse: 80 bpm,  Respiratory Rate: 16 breaths/min, Blood Pressure: 113/64 mmHg. Cardiovascular Has palpable dorsalis pedis and posterior tibial pulses on the left the right was a little more difficult to feel.. General Notes: Wound exam; the area on the left lateral malleolus actually looks quite a bit better. Some surface slough removed with a #3 curette. There is still some depth to this in the middle of the wound but it does not probe to bone. Most of the granulation looks healthy. No evidence of infection Integumentary (Hair, Skin) Wound #1 status is Open. Original cause of wound was Pressure Injury. The date acquired was: 03/27/2020. The wound has been in treatment 8 weeks. The wound is located on the Left,Lateral Malleolus. The wound measures 0.5cm length x 0.4cm width x 0.1cm depth; 0.157cm^2 area and 0.016cm^3 volume. Wound #3 status is Open. Original cause of wound was Gradually Appeared. The date acquired was: 08/22/2020. The wound has been in treatment 6 weeks. The wound is located on the Right Knee. The wound measures 0cm length x 0cm width x 0cm depth; 0cm^2 area and 0cm^3 volume. Assessment Active Problems ICD-10 Type 2 diabetes mellitus with other skin ulcer Peripheral vascular disease, unspecified Type 2 diabetes mellitus with diabetic peripheral angiopathy without gangrene Non-pressure chronic ulcer of left ankle with other specified severity Non-pressure chronic ulcer of other part of right foot limited to breakdown of skin Procedures Wound #1 Pre-procedure diagnosis of Wound #1 is an Arterial Insufficiency Ulcer located on the Left,Lateral Malleolus .Severity of Tissue Pre Debridement is: Fat layer exposed. There was a Selective/Open Wound Skin/Epidermis Debridement with a total area of 0.2 sq cm performed by Ricard Dillon., MD. With the following instrument(s): Curette to remove Viable and Non-Viable tissue/material. Material removed includes Slough, Skin: Dermis, and Skin: Epidermis  after achieving pain control using Lidocaine. No specimens were taken. A time out was conducted at 11:23, prior to the start of the procedure. A Minimum amount of bleeding was  controlled with Pressure. The procedure was tolerated well with a pain level of 0 throughout and a pain level of 0 following the procedure. Post Debridement Measurements: 0.5cm length x 0.4cm width x 0.1cm depth; 0.016cm^3 volume. Character of Wound/Ulcer Post Debridement is improved. Severity of Tissue Post Debridement is: Fat layer exposed. Post procedure Diagnosis Wound #1: Same as Pre-Procedure Plan Follow-up Appointments: Return Appointment in 2 weeks. Bathing/ Shower/ Hygiene: May shower with protection but do not get wound dressing(s) wet. - with not dressing changes only. May shower and wash wound with soap and water. - may wash with soap and water with dressing change. Edema Control - Lymphedema / SCD / Other: Elevate legs to the level of the heart or above for 30 minutes daily and/or when sitting, a frequency of: - throughout the day. Avoid standing for long periods of time. Off-Loading: Other: - ensure no pressure to left ankle wound. wear boots to relieve pressure from heels. Home Health: New wound care orders this week; continue Home Health for wound care. May utilize formulary equivalent dressing for wound treatment orders unless otherwise specified. - Home health to change twice a week. WOUND #1: - Malleolus Wound Laterality: Left, Lateral Cleanser: Soap and Water [facility]) Every Other Day/30 Days Discharge Instructions: May shower and wash wound with dial antibacterial soap and water prior to dressing change. Peri-Wound Care: Skin Prep [facility]) Every Other Day/30 Days Discharge Instructions: Use skin prep as directed Prim Dressing: Endoform 2x2 in [facility]) Every Other Day/30 Days ary Discharge Instructions: Moisten with saline Secondary Dressing: Bordered Gauze, 4x4 in [facility])  Every Other Day/30 Days Discharge Instructions: or bordered foam. Apply over primary dressing as directed. 1. I change the primary dressing on the left lateral malleolus to endoform 2. Hopefully this will stimulate some granulation 3 3 there is no open wounds on the right leg. I agree that the area on the right medial heel appears to have possibly a deep tissue injury somewhat boggy feel. There is not a lot of pain and no evidence this is infected. I discussion with the grandson there is not any option here but to attempt to offload these as rigorously as possible. He is already using open heeled shoes. Perhaps a pillow under her leg with her existing boots might help Electronic Signature(s) Signed: 10/08/2020 5:48:16 PM By: Linton Ham MD Entered By: Linton Ham on 10/08/2020 11:55:34 -------------------------------------------------------------------------------- SuperBill Details Patient Name: Date of Service: [name] 10/08/2020 Medical Record Number: [id number] Patient Account Number: [id number] Date of Birth/Sex: Treating RN: May 25, 1946 (75 y.o. [name], [name] Primary Care Provider: [name] Other Clinician: Referring Provider: Treating Provider/Extender: Gabriel Rainwater in Treatment: 8 Diagnosis Coding ICD-10 Codes Code Description E11.622 Type 2 diabetes mellitus with other skin ulcer I73.9 Peripheral vascular disease, unspecified E11.51 Type 2 diabetes mellitus with diabetic peripheral angiopathy without gangrene L97.328 Non-pressure chronic ulcer of left ankle with other specified severity L97.511 Non-pressure chronic ulcer of other part of right foot limited to breakdown of skin Facility Procedures CPT4 Code: [id number] Description: [phone] - DEBRIDE WOUND 1ST 20 SQ CM OR < ICD-10 Diagnosis Description L97.328 Non-pressure chronic ulcer of left ankle with other specified severity L97.511 Non-pressure chronic ulcer  of other part of right foot limited to breakdown of s Modifier: kin Quantity: 1 Physician Procedures : CPT4 Code Description Modifier [id number] - WC PHYS DEBR WO ANESTH 20 SQ CM ICD-10 Diagnosis Description L97.328 Non-pressure chronic ulcer of left ankle with  other specified severity L97.511 Non-pressure chronic ulcer of other part of right foot  limited to breakdown of skin Quantity: 1 Electronic Signature(s) Signed: 10/08/2020 5:48:16 PM By: Linton Ham MD Entered By: Linton Ham on 10/08/2020 11:55:45

## 2020-10-09 NOTE — Progress Notes (Signed)
Crystal Moore, Crystal Moore ([id number]) Visit Report for 10/08/2020 Arrival Information Details Patient Name: Date of Service: Crystal Moore, Crystal Moore. 10/08/2020 10:15 A M Medical Record Number: [id number] Patient Account Number: [id number] Date of Birth/Sex: Treating RN: Jul 16, 1946 (75 y.o. [name], [name] Primary Care Takya Vandivier: [name] Other Clinician: Referring Jamyiah Labella: Treating Virgin Zellers/Extender: Gabriel Rainwater in Treatment: 8 Visit Information History Since Last Visit Added or deleted any medications: No Patient Arrived: Wheel Chair Any new allergies or adverse reactions: No Arrival Time: 10:48 Had a fall or experienced change in No Accompanied By: son activities of daily living that may affect Transfer Assistance: None risk of falls: Patient Identification Verified: Yes Signs or symptoms of abuse/neglect since last visito No Secondary Verification Process Completed: Yes Hospitalized since last visit: No Patient Requires Transmission-Based Precautions: No Implantable device outside of the clinic excluding No Patient Has Alerts: Yes cellular tissue based products placed in the center Patient Alerts: Patient on Blood Thinner since last visit: Has Dressing in Place as Prescribed: Yes Pain Present Now: No Electronic Signature(s) Signed: 10/08/2020 3:44:20 PM By: Sandre Kitty Entered By: Sandre Kitty on 10/08/2020 10:48:40 -------------------------------------------------------------------------------- Encounter Discharge Information Details Patient Name: Date of Service: Crystal Grammes M. 10/08/2020 10:15 A M Medical Record Number: [id number] Patient Account Number: [id number] Date of Birth/Sex: Treating RN: 1946-03-07 (75 y.o. [name], [name] Primary Care Marshella Tello: [name] Other Clinician: Referring Saamiya Jeppsen: Treating Durante Violett/Extender: Gabriel Rainwater in Treatment: 8 Encounter  Discharge Information Items Post Procedure Vitals Discharge Condition: Stable Temperature (F): 97.6 Ambulatory Status: Wheelchair Pulse (bpm): 80 Discharge Destination: Home Respiratory Rate (breaths/min): 16 Transportation: Private Auto Blood Pressure (mmHg): 113/64 Accompanied By: Son Schedule Follow-up Appointment: Yes Clinical Summary of Care: Provided on 10/08/2020 Form Type Recipient Paper Patient Patient Electronic Signature(s) Signed: 10/08/2020 11:47:38 AM By: Lorrin Jackson Entered By: Lorrin Jackson on 10/08/2020 11:47:38 -------------------------------------------------------------------------------- Lower Extremity Assessment Details Patient Name: Date of Service: Crystal Moore, TAMM. 10/08/2020 10:15 A M Medical Record Number: [id number] Patient Account Number: [id number] Date of Birth/Sex: Treating RN: 1946-05-06 (75 y.o. [name], [name] Primary Care Starlina Lapre: [name] Other Clinician: Referring Donevan Biller: Treating Orley Lawry/Extender: Gabriel Rainwater in Treatment: 8 Edema Assessment Assessed: [Left: No] [Right: Yes] Edema: [Left: No] [Right: No] Calf Left: Right: Point of Measurement: 27 cm From Medial Instep 30.5 cm 30 cm Ankle Left: Right: Point of Measurement: 11 cm From Medial Instep 18.8 cm 17.5 cm Vascular Assessment Pulses: Dorsalis Pedis Palpable: [Left:Yes] [Right:Yes] Posterior Tibial Palpable: [Left:Yes] [Right:Yes] Electronic Signature(s) Signed: 10/09/2020 5:17:38 PM By: Rhae Hammock RN Entered By: Rhae Hammock on 10/08/2020 11:20:28 -------------------------------------------------------------------------------- Multi Wound Chart Details Patient Name: Date of Service: Crystal Grammes M. 10/08/2020 10:15 A M Medical Record Number: [id number] Patient Account Number: [id number] Date of Birth/Sex: Treating RN: 13-Sep-1945 (75 y.o. [name], [name] Primary Care Jericca Russett: [name] Other Clinician: Referring Juda Lajeunesse: Treating Ashlei Chinchilla/Extender: Gabriel Rainwater in Treatment: 8 Vital Signs Height(in): Pulse(bpm): 80 Weight(lbs): Blood Pressure(mmHg): 113/64 Body Mass Index(BMI): Temperature(F): 97.6 Respiratory Rate(breaths/min): 16 Photos: [1:No Photos Left, Lateral Malleolus] [3:No Photos Right Knee] [N/A:N/A N/A] Wound Location: [1:Pressure Injury] [3:Gradually Appeared] [N/A:N/A] Wounding Event: [1:Arterial Insufficiency Ulcer] [3:Diabetic Wound/Ulcer of the Lower] [N/A:N/A] Primary Etiology: [1:03/27/2020] [3:Extremity 08/22/2020] [N/A:N/A] Date Acquired: [1:8] [3:6] [N/A:N/A] Weeks of Treatment: [1:Open] [3:Open] [N/A:N/A] Wound Status: [1:0.5x0.4x0.1] [3:0x0x0] [N/A:N/A] Measurements L x W x D (cm) [1:0.157] [3:0] [N/A:N/A] A (cm) : rea [1:0.016] [3:0] [N/A:N/A] Volume (cm) : [1:83.30%] [3:100.00%] [N/A:N/A] %  Reduction in Area: [1:91.50%] [3:100.00%] [N/A:N/A] % Reduction in Volume: [1:Full Thickness Without Exposed] [3:Grade 1] [N/A:N/A] Classification: [1:Support Structures Debridement - Selective/Open Wound] [3:N/A] [N/A:N/A] Debridement: Pre-procedure Verification/Time Out 11:23 [3:N/A] [N/A:N/A] Taken: [1:Lidocaine] [3:N/A] [N/A:N/A] Pain Control: [1:Slough] [3:N/A] [N/A:N/A] Tissue Debrided: [1:Skin/Epidermis] [3:N/A] [N/A:N/A] Level: [1:0.2] [3:N/A] [N/A:N/A] Debridement A (sq cm): [1:rea Curette] [3:N/A] [N/A:N/A] Instrument: [1:Minimum] [3:N/A] [N/A:N/A] Bleeding: [1:Pressure] [3:N/A] [N/A:N/A] Hemostasis A chieved: [1:0] [3:N/A] [N/A:N/A] Procedural Pain: [1:0] [3:N/A] [N/A:N/A] Post Procedural Pain: [1:Procedure was tolerated well] [3:N/A] [N/A:N/A] Debridement Treatment Response: [1:0.5x0.4x0.1] [3:N/A] [N/A:N/A] Post Debridement Measurements L x W x D (cm) [1:0.016] [3:N/A] [N/A:N/A] Post Debridement Volume: (cm) [1:Debridement] [3:N/A] [N/A:N/A] Treatment Notes Wound #1 (Malleolus) Wound  Laterality: Left, Lateral Cleanser Soap and Water Discharge Instruction: May shower and wash wound with dial antibacterial soap and water prior to dressing change. Peri-Wound Care Skin Prep Discharge Instruction: Use skin prep as directed Topical Primary Dressing Endoform 2x2 in Discharge Instruction: Moisten with saline Secondary Dressing Bordered Gauze, 4x4 in Discharge Instruction: or bordered foam. Apply over primary dressing as directed. Secured With Compression Wrap Compression Stockings Add-Ons Wound #3 (Knee) Wound Laterality: Right Cleanser Peri-Wound Care Topical Primary Dressing Secondary Dressing Secured With Compression Wrap Compression Stockings Add-Ons Electronic Signature(s) Signed: 10/08/2020 5:48:16 PM By: Linton Ham MD Signed: 10/09/2020 5:17:38 PM By: Rhae Hammock RN Entered By: Linton Ham on 10/08/2020 11:49:02 -------------------------------------------------------------------------------- Multi-Disciplinary Care Plan Details Patient Name: Date of Service: Crystal Grammes M. 10/08/2020 10:15 A M Medical Record Number: [id number] Patient Account Number: [id number] Date of Birth/Sex: Treating RN: 05-09-1946 (75 y.o. [name], [name] Primary Care Raydell Maners: [name] Other Clinician: Referring Etan Vasudevan: Treating Rosabelle Jupin/Extender: Gabriel Rainwater in Treatment: 8 Active Inactive Abuse / Safety / Falls / Self Care Management Nursing Diagnoses: Potential for falls Goals: Patient will not experience any injury related to falls Date Initiated: 08/08/2020 Target Resolution Date: 11/01/2020 Goal Status: Active Patient/caregiver will verbalize understanding of skin care regimen Date Initiated: 08/08/2020 Target Resolution Date: 10/04/2020 Goal Status: Active Interventions: Provide education on fall prevention Notes: Nutrition Nursing Diagnoses: Potential for alteratiion in Nutrition/Potential  for imbalanced nutrition Goals: Patient/caregiver agrees to and verbalizes understanding of need to obtain nutritional consultation Date Initiated: 08/08/2020 Target Resolution Date: 10/04/2020 Goal Status: Active Interventions: Provide education on elevated blood sugars and impact on wound healing Provide education on nutrition Treatment Activities: Patient referred to Primary Care Physician for further nutritional evaluation : 08/08/2020 Notes: Wound/Skin Impairment Nursing Diagnoses: Knowledge deficit related to ulceration/compromised skin integrity Goals: Patient/caregiver will verbalize understanding of skin care regimen Date Initiated: 08/08/2020 Date Inactivated: 08/22/2020 Target Resolution Date: 10/04/2020 Goal Status: Met Ulcer/skin breakdown will heal within 14 weeks Date Initiated: 08/08/2020 Target Resolution Date: 11/29/2020 Goal Status: Active Interventions: Assess patient/caregiver ability to obtain necessary supplies Assess patient/caregiver ability to perform ulcer/skin care regimen upon admission and as needed Provide education on ulcer and skin care Treatment Activities: Skin care regimen initiated : 08/08/2020 Topical wound management initiated : 08/08/2020 Notes: Electronic Signature(s) Signed: 10/09/2020 5:17:38 PM By: Rhae Hammock RN Entered By: Rhae Hammock on 10/08/2020 11:30:44 -------------------------------------------------------------------------------- Pain Assessment Details Patient Name: Date of Service: Crystal Lennox. 10/08/2020 10:15 A M Medical Record Number: [id number] Patient Account Number: [id number] Date of Birth/Sex: Treating RN: Feb 22, 1946 (75 y.o. [name], [name] Primary Care Kerrilynn Derenzo: [name] Other Clinician: Referring Nicasio Barlowe: Treating Namari Breton/Extender: Gabriel Rainwater in Treatment: 8 Active Problems Location of Pain Severity and Description of Pain Patient Has Paino  No Site Locations  Pain Management and Medication Current Pain Management: Electronic Signature(s) Signed: 10/08/2020 3:44:20 PM By: Sandre Kitty Signed: 10/09/2020 5:17:38 PM By: Rhae Hammock RN Entered By: Sandre Kitty on 10/08/2020 10:49:14 -------------------------------------------------------------------------------- Patient/Caregiver Education Details Patient Name: Date of Service: Crystal Lennox 3/15/2022andnbsp10:15 [place] Record Number: [id number] Patient Account Number: [id number] Date of Birth/Gender: Treating RN: Dec 06, 1945 (75 y.o. Benjaman Lobe Primary Care Physician: [name] Other Clinician: Referring Physician: Treating Physician/Extender: Gabriel Rainwater in Treatment: 8 Education Assessment Education Provided To: Patient Education Topics Provided Wound/Skin Impairment: Methods: Explain/Verbal Responses: State content correctly Electronic Signature(s) Signed: 10/09/2020 5:17:38 PM By: Rhae Hammock RN Entered By: Rhae Hammock on 10/08/2020 11:30:59 -------------------------------------------------------------------------------- Wound Assessment Details Patient Name: Date of Service: Crystal Lennox. 10/08/2020 10:15 A M Medical Record Number: [id number] Patient Account Number: [id number] Date of Birth/Sex: Treating RN: 01-18-1946 (75 y.o. [name], [name] Primary Care Jaisean Monteforte: [name] Other Clinician: Referring Glady Ouderkirk: Treating Makailee Nudelman/Extender: Gabriel Rainwater in Treatment: 8 Wound Status Wound Number: 1 Primary Arterial Insufficiency Ulcer Etiology: Wound Location: Left, Lateral Malleolus Wound Open Wounding Event: Pressure Injury Status: Date Acquired: 03/27/2020 Comorbid Chronic Obstructive Pulmonary Disease (COPD), Hypertension, Weeks Of Treatment: 8 History: Type II Diabetes, Osteoarthritis, Seizure  Disorder Clustered Wound: No Photos Wound Measurements Length: (cm) 0.5 Width: (cm) 0.4 Depth: (cm) 0.1 Area: (cm) 0.157 Volume: (cm) 0.016 % Reduction in Area: 83.3% % Reduction in Volume: 91.5% Epithelialization: None Wound Description Classification: Full Thickness Without Exposed Support Structures Wound Margin: Distinct, outline attached Exudate Amount: Medium Exudate Type: Serosanguineous Exudate Color: red, brown Foul Odor After Cleansing: No Slough/Fibrino Yes Wound Bed Granulation Amount: Small (1-33%) Exposed Structure Granulation Quality: Pink, Pale Fascia Exposed: No Necrotic Amount: Large (67-100%) Fat Layer (Subcutaneous Tissue) Exposed: Yes Necrotic Quality: Adherent Slough Tendon Exposed: No Muscle Exposed: No Joint Exposed: No Bone Exposed: No Treatment Notes Wound #1 (Malleolus) Wound Laterality: Left, Lateral Cleanser Soap and Water Discharge Instruction: May shower and wash wound with dial antibacterial soap and water prior to dressing change. Peri-Wound Care Skin Prep Discharge Instruction: Use skin prep as directed Topical Primary Dressing Endoform 2x2 in Discharge Instruction: Moisten with saline Secondary Dressing Bordered Gauze, 4x4 in Discharge Instruction: or bordered foam. Apply over primary dressing as directed. Secured With Compression Wrap Compression Stockings Environmental education officer) Signed: 10/08/2020 5:27:17 PM By: Sandre Kitty Signed: 10/09/2020 5:17:38 PM By: Rhae Hammock RN Previous Signature: 10/08/2020 3:44:20 PM Version By: Sandre Kitty Entered By: Sandre Kitty on 10/08/2020 16:56:46 -------------------------------------------------------------------------------- Wound Assessment Details Patient Name: Date of Service: Crystal Lennox. 10/08/2020 10:15 A M Medical Record Number: [id number] Patient Account Number: [id number] Date of Birth/Sex: Treating RN: 27-Apr-1946 (75 y.o. [name],  [name] Primary Care Timonthy Hovater: [name] Other Clinician: Referring Katricia Prehn: Treating Rozanne Heumann/Extender: Gabriel Rainwater in Treatment: 8 Wound Status Wound Number: 3 Primary Etiology: Diabetic Wound/Ulcer of the Lower Extremity Wound Location: Right Knee Wound Status: Open Wounding Event: Gradually Appeared Date Acquired: 08/22/2020 Weeks Of Treatment: 6 Clustered Wound: No Wound Measurements Length: (cm) Width: (cm) Depth: (cm) Area: (cm) Volume: (cm) 0 % Reduction in Area: 100% 0 % Reduction in Volume: 100% 0 0 0 Wound Description Classification: Grade 1 Electronic Signature(s) Signed: 10/08/2020 3:44:20 PM By: Sandre Kitty Signed: 10/09/2020 5:17:38 PM By: Rhae Hammock RN Entered By: Melida Gimenez, Destiny on 10/08/2020 10:56:21 -------------------------------------------------------------------------------- Vitals Details Patient Name: Date of Service: Crystal Grammes M. 10/08/2020 10:15 A M Medical Record Number: [id number] Patient  Account Number: [id number] Date of Birth/Sex: Treating RN: 1945-09-12 (75 y.o. [name], [name] Primary Care Letica Giaimo: [name] Other Clinician: Referring Ima Hafner: Treating Siyona Coto/Extender: Gabriel Rainwater in Treatment: 8 Vital Signs Time Taken: 10:48 Temperature (F): 97.6 Pulse (bpm): 80 Respiratory Rate (breaths/min): 16 Blood Pressure (mmHg): 113/64 Reference Range: 80 - 120 mg / dl Electronic Signature(s) Signed: 10/08/2020 3:44:20 PM By: Sandre Kitty Entered By: Sandre Kitty on 10/08/2020 10:49:08

## 2020-10-10 DIAGNOSIS — Z794 Long term (current) use of insulin: Secondary | ICD-10-CM | POA: Diagnosis not present

## 2020-10-10 DIAGNOSIS — I739 Peripheral vascular disease, unspecified: Secondary | ICD-10-CM | POA: Diagnosis not present

## 2020-10-10 DIAGNOSIS — D62 Acute posthemorrhagic anemia: Secondary | ICD-10-CM | POA: Diagnosis not present

## 2020-10-10 DIAGNOSIS — L97512 Non-pressure chronic ulcer of other part of right foot with fat layer exposed: Secondary | ICD-10-CM | POA: Diagnosis not present

## 2020-10-10 DIAGNOSIS — R531 Weakness: Secondary | ICD-10-CM | POA: Diagnosis not present

## 2020-10-10 DIAGNOSIS — R2681 Unsteadiness on feet: Secondary | ICD-10-CM | POA: Diagnosis not present

## 2020-10-10 DIAGNOSIS — L97328 Non-pressure chronic ulcer of left ankle with other specified severity: Secondary | ICD-10-CM | POA: Diagnosis not present

## 2020-10-10 DIAGNOSIS — J9611 Chronic respiratory failure with hypoxia: Secondary | ICD-10-CM | POA: Diagnosis not present

## 2020-10-10 DIAGNOSIS — E11622 Type 2 diabetes mellitus with other skin ulcer: Secondary | ICD-10-CM | POA: Diagnosis not present

## 2020-10-10 DIAGNOSIS — E1151 Type 2 diabetes mellitus with diabetic peripheral angiopathy without gangrene: Secondary | ICD-10-CM | POA: Diagnosis not present

## 2020-10-14 DIAGNOSIS — D62 Acute posthemorrhagic anemia: Secondary | ICD-10-CM | POA: Diagnosis not present

## 2020-10-14 DIAGNOSIS — R531 Weakness: Secondary | ICD-10-CM | POA: Diagnosis not present

## 2020-10-14 DIAGNOSIS — I739 Peripheral vascular disease, unspecified: Secondary | ICD-10-CM | POA: Diagnosis not present

## 2020-10-14 DIAGNOSIS — L97328 Non-pressure chronic ulcer of left ankle with other specified severity: Secondary | ICD-10-CM | POA: Diagnosis not present

## 2020-10-14 DIAGNOSIS — J9611 Chronic respiratory failure with hypoxia: Secondary | ICD-10-CM | POA: Diagnosis not present

## 2020-10-14 DIAGNOSIS — L97512 Non-pressure chronic ulcer of other part of right foot with fat layer exposed: Secondary | ICD-10-CM | POA: Diagnosis not present

## 2020-10-14 DIAGNOSIS — E1151 Type 2 diabetes mellitus with diabetic peripheral angiopathy without gangrene: Secondary | ICD-10-CM | POA: Diagnosis not present

## 2020-10-14 DIAGNOSIS — E11622 Type 2 diabetes mellitus with other skin ulcer: Secondary | ICD-10-CM | POA: Diagnosis not present

## 2020-10-14 DIAGNOSIS — R2681 Unsteadiness on feet: Secondary | ICD-10-CM | POA: Diagnosis not present

## 2020-10-14 DIAGNOSIS — Z794 Long term (current) use of insulin: Secondary | ICD-10-CM | POA: Diagnosis not present

## 2020-10-15 ENCOUNTER — Ambulatory Visit: Payer: Medicare Other | Admitting: Gastroenterology

## 2020-10-16 DIAGNOSIS — R2681 Unsteadiness on feet: Secondary | ICD-10-CM | POA: Diagnosis not present

## 2020-10-16 DIAGNOSIS — R531 Weakness: Secondary | ICD-10-CM | POA: Diagnosis not present

## 2020-10-16 DIAGNOSIS — E1151 Type 2 diabetes mellitus with diabetic peripheral angiopathy without gangrene: Secondary | ICD-10-CM | POA: Diagnosis not present

## 2020-10-16 DIAGNOSIS — L97328 Non-pressure chronic ulcer of left ankle with other specified severity: Secondary | ICD-10-CM | POA: Diagnosis not present

## 2020-10-16 DIAGNOSIS — I739 Peripheral vascular disease, unspecified: Secondary | ICD-10-CM | POA: Diagnosis not present

## 2020-10-16 DIAGNOSIS — J9611 Chronic respiratory failure with hypoxia: Secondary | ICD-10-CM | POA: Diagnosis not present

## 2020-10-16 DIAGNOSIS — Z794 Long term (current) use of insulin: Secondary | ICD-10-CM | POA: Diagnosis not present

## 2020-10-16 DIAGNOSIS — D62 Acute posthemorrhagic anemia: Secondary | ICD-10-CM | POA: Diagnosis not present

## 2020-10-16 DIAGNOSIS — E11622 Type 2 diabetes mellitus with other skin ulcer: Secondary | ICD-10-CM | POA: Diagnosis not present

## 2020-10-16 DIAGNOSIS — L97512 Non-pressure chronic ulcer of other part of right foot with fat layer exposed: Secondary | ICD-10-CM | POA: Diagnosis not present

## 2020-10-17 DIAGNOSIS — L97512 Non-pressure chronic ulcer of other part of right foot with fat layer exposed: Secondary | ICD-10-CM | POA: Diagnosis not present

## 2020-10-17 DIAGNOSIS — R2681 Unsteadiness on feet: Secondary | ICD-10-CM | POA: Diagnosis not present

## 2020-10-17 DIAGNOSIS — Z794 Long term (current) use of insulin: Secondary | ICD-10-CM | POA: Diagnosis not present

## 2020-10-17 DIAGNOSIS — R531 Weakness: Secondary | ICD-10-CM | POA: Diagnosis not present

## 2020-10-17 DIAGNOSIS — J9611 Chronic respiratory failure with hypoxia: Secondary | ICD-10-CM | POA: Diagnosis not present

## 2020-10-17 DIAGNOSIS — L97328 Non-pressure chronic ulcer of left ankle with other specified severity: Secondary | ICD-10-CM | POA: Diagnosis not present

## 2020-10-17 DIAGNOSIS — I739 Peripheral vascular disease, unspecified: Secondary | ICD-10-CM | POA: Diagnosis not present

## 2020-10-17 DIAGNOSIS — D62 Acute posthemorrhagic anemia: Secondary | ICD-10-CM | POA: Diagnosis not present

## 2020-10-17 DIAGNOSIS — E1151 Type 2 diabetes mellitus with diabetic peripheral angiopathy without gangrene: Secondary | ICD-10-CM | POA: Diagnosis not present

## 2020-10-17 DIAGNOSIS — E11622 Type 2 diabetes mellitus with other skin ulcer: Secondary | ICD-10-CM | POA: Diagnosis not present

## 2020-10-21 DIAGNOSIS — I739 Peripheral vascular disease, unspecified: Secondary | ICD-10-CM | POA: Diagnosis not present

## 2020-10-21 DIAGNOSIS — R2681 Unsteadiness on feet: Secondary | ICD-10-CM | POA: Diagnosis not present

## 2020-10-21 DIAGNOSIS — L97328 Non-pressure chronic ulcer of left ankle with other specified severity: Secondary | ICD-10-CM | POA: Diagnosis not present

## 2020-10-21 DIAGNOSIS — R531 Weakness: Secondary | ICD-10-CM | POA: Diagnosis not present

## 2020-10-21 DIAGNOSIS — L97512 Non-pressure chronic ulcer of other part of right foot with fat layer exposed: Secondary | ICD-10-CM | POA: Diagnosis not present

## 2020-10-21 DIAGNOSIS — D62 Acute posthemorrhagic anemia: Secondary | ICD-10-CM | POA: Diagnosis not present

## 2020-10-21 DIAGNOSIS — Z794 Long term (current) use of insulin: Secondary | ICD-10-CM | POA: Diagnosis not present

## 2020-10-21 DIAGNOSIS — E11622 Type 2 diabetes mellitus with other skin ulcer: Secondary | ICD-10-CM | POA: Diagnosis not present

## 2020-10-21 DIAGNOSIS — J9611 Chronic respiratory failure with hypoxia: Secondary | ICD-10-CM | POA: Diagnosis not present

## 2020-10-21 DIAGNOSIS — E1151 Type 2 diabetes mellitus with diabetic peripheral angiopathy without gangrene: Secondary | ICD-10-CM | POA: Diagnosis not present

## 2020-10-22 ENCOUNTER — Encounter (HOSPITAL_BASED_OUTPATIENT_CLINIC_OR_DEPARTMENT_OTHER): Payer: Medicare Other | Admitting: Internal Medicine

## 2020-10-22 DIAGNOSIS — D62 Acute posthemorrhagic anemia: Secondary | ICD-10-CM | POA: Diagnosis not present

## 2020-10-22 DIAGNOSIS — R531 Weakness: Secondary | ICD-10-CM | POA: Diagnosis not present

## 2020-10-22 DIAGNOSIS — L97328 Non-pressure chronic ulcer of left ankle with other specified severity: Secondary | ICD-10-CM | POA: Diagnosis not present

## 2020-10-22 DIAGNOSIS — R2681 Unsteadiness on feet: Secondary | ICD-10-CM | POA: Diagnosis not present

## 2020-10-22 DIAGNOSIS — Z794 Long term (current) use of insulin: Secondary | ICD-10-CM | POA: Diagnosis not present

## 2020-10-22 DIAGNOSIS — E11622 Type 2 diabetes mellitus with other skin ulcer: Secondary | ICD-10-CM | POA: Diagnosis not present

## 2020-10-22 DIAGNOSIS — L97512 Non-pressure chronic ulcer of other part of right foot with fat layer exposed: Secondary | ICD-10-CM | POA: Diagnosis not present

## 2020-10-22 DIAGNOSIS — E1151 Type 2 diabetes mellitus with diabetic peripheral angiopathy without gangrene: Secondary | ICD-10-CM | POA: Diagnosis not present

## 2020-10-22 DIAGNOSIS — I739 Peripheral vascular disease, unspecified: Secondary | ICD-10-CM | POA: Diagnosis not present

## 2020-10-22 DIAGNOSIS — J9611 Chronic respiratory failure with hypoxia: Secondary | ICD-10-CM | POA: Diagnosis not present

## 2020-10-23 DIAGNOSIS — L97512 Non-pressure chronic ulcer of other part of right foot with fat layer exposed: Secondary | ICD-10-CM | POA: Diagnosis not present

## 2020-10-23 DIAGNOSIS — Z794 Long term (current) use of insulin: Secondary | ICD-10-CM | POA: Diagnosis not present

## 2020-10-23 DIAGNOSIS — R2681 Unsteadiness on feet: Secondary | ICD-10-CM | POA: Diagnosis not present

## 2020-10-23 DIAGNOSIS — J9611 Chronic respiratory failure with hypoxia: Secondary | ICD-10-CM | POA: Diagnosis not present

## 2020-10-23 DIAGNOSIS — L97328 Non-pressure chronic ulcer of left ankle with other specified severity: Secondary | ICD-10-CM | POA: Diagnosis not present

## 2020-10-23 DIAGNOSIS — I739 Peripheral vascular disease, unspecified: Secondary | ICD-10-CM | POA: Diagnosis not present

## 2020-10-23 DIAGNOSIS — R531 Weakness: Secondary | ICD-10-CM | POA: Diagnosis not present

## 2020-10-23 DIAGNOSIS — E11622 Type 2 diabetes mellitus with other skin ulcer: Secondary | ICD-10-CM | POA: Diagnosis not present

## 2020-10-23 DIAGNOSIS — D62 Acute posthemorrhagic anemia: Secondary | ICD-10-CM | POA: Diagnosis not present

## 2020-10-23 DIAGNOSIS — E1151 Type 2 diabetes mellitus with diabetic peripheral angiopathy without gangrene: Secondary | ICD-10-CM | POA: Diagnosis not present

## 2020-10-24 DIAGNOSIS — J9611 Chronic respiratory failure with hypoxia: Secondary | ICD-10-CM | POA: Diagnosis not present

## 2020-10-24 DIAGNOSIS — E11622 Type 2 diabetes mellitus with other skin ulcer: Secondary | ICD-10-CM | POA: Diagnosis not present

## 2020-10-24 DIAGNOSIS — R2681 Unsteadiness on feet: Secondary | ICD-10-CM | POA: Diagnosis not present

## 2020-10-24 DIAGNOSIS — L97328 Non-pressure chronic ulcer of left ankle with other specified severity: Secondary | ICD-10-CM | POA: Diagnosis not present

## 2020-10-24 DIAGNOSIS — E1151 Type 2 diabetes mellitus with diabetic peripheral angiopathy without gangrene: Secondary | ICD-10-CM | POA: Diagnosis not present

## 2020-10-24 DIAGNOSIS — L97512 Non-pressure chronic ulcer of other part of right foot with fat layer exposed: Secondary | ICD-10-CM | POA: Diagnosis not present

## 2020-10-24 DIAGNOSIS — D62 Acute posthemorrhagic anemia: Secondary | ICD-10-CM | POA: Diagnosis not present

## 2020-10-24 DIAGNOSIS — I739 Peripheral vascular disease, unspecified: Secondary | ICD-10-CM | POA: Diagnosis not present

## 2020-10-24 DIAGNOSIS — R531 Weakness: Secondary | ICD-10-CM | POA: Diagnosis not present

## 2020-10-24 DIAGNOSIS — Z794 Long term (current) use of insulin: Secondary | ICD-10-CM | POA: Diagnosis not present

## 2020-10-27 DIAGNOSIS — J439 Emphysema, unspecified: Secondary | ICD-10-CM | POA: Diagnosis not present

## 2020-10-28 DIAGNOSIS — J9611 Chronic respiratory failure with hypoxia: Secondary | ICD-10-CM | POA: Diagnosis not present

## 2020-10-28 DIAGNOSIS — R2681 Unsteadiness on feet: Secondary | ICD-10-CM | POA: Diagnosis not present

## 2020-10-28 DIAGNOSIS — L97328 Non-pressure chronic ulcer of left ankle with other specified severity: Secondary | ICD-10-CM | POA: Diagnosis not present

## 2020-10-28 DIAGNOSIS — D62 Acute posthemorrhagic anemia: Secondary | ICD-10-CM | POA: Diagnosis not present

## 2020-10-28 DIAGNOSIS — L97512 Non-pressure chronic ulcer of other part of right foot with fat layer exposed: Secondary | ICD-10-CM | POA: Diagnosis not present

## 2020-10-28 DIAGNOSIS — E11622 Type 2 diabetes mellitus with other skin ulcer: Secondary | ICD-10-CM | POA: Diagnosis not present

## 2020-10-28 DIAGNOSIS — Z794 Long term (current) use of insulin: Secondary | ICD-10-CM | POA: Diagnosis not present

## 2020-10-28 DIAGNOSIS — I739 Peripheral vascular disease, unspecified: Secondary | ICD-10-CM | POA: Diagnosis not present

## 2020-10-28 DIAGNOSIS — R531 Weakness: Secondary | ICD-10-CM | POA: Diagnosis not present

## 2020-10-28 DIAGNOSIS — E1151 Type 2 diabetes mellitus with diabetic peripheral angiopathy without gangrene: Secondary | ICD-10-CM | POA: Diagnosis not present

## 2020-10-29 DIAGNOSIS — Z794 Long term (current) use of insulin: Secondary | ICD-10-CM | POA: Diagnosis not present

## 2020-10-29 DIAGNOSIS — R2681 Unsteadiness on feet: Secondary | ICD-10-CM | POA: Diagnosis not present

## 2020-10-29 DIAGNOSIS — D62 Acute posthemorrhagic anemia: Secondary | ICD-10-CM | POA: Diagnosis not present

## 2020-10-29 DIAGNOSIS — E11622 Type 2 diabetes mellitus with other skin ulcer: Secondary | ICD-10-CM | POA: Diagnosis not present

## 2020-10-29 DIAGNOSIS — L97512 Non-pressure chronic ulcer of other part of right foot with fat layer exposed: Secondary | ICD-10-CM | POA: Diagnosis not present

## 2020-10-29 DIAGNOSIS — L97328 Non-pressure chronic ulcer of left ankle with other specified severity: Secondary | ICD-10-CM | POA: Diagnosis not present

## 2020-10-29 DIAGNOSIS — I739 Peripheral vascular disease, unspecified: Secondary | ICD-10-CM | POA: Diagnosis not present

## 2020-10-29 DIAGNOSIS — E1151 Type 2 diabetes mellitus with diabetic peripheral angiopathy without gangrene: Secondary | ICD-10-CM | POA: Diagnosis not present

## 2020-10-29 DIAGNOSIS — J9611 Chronic respiratory failure with hypoxia: Secondary | ICD-10-CM | POA: Diagnosis not present

## 2020-10-29 DIAGNOSIS — R531 Weakness: Secondary | ICD-10-CM | POA: Diagnosis not present

## 2020-10-30 ENCOUNTER — Ambulatory Visit: Payer: Medicare Other | Admitting: Gastroenterology

## 2020-10-30 DIAGNOSIS — R531 Weakness: Secondary | ICD-10-CM | POA: Diagnosis not present

## 2020-10-30 DIAGNOSIS — I739 Peripheral vascular disease, unspecified: Secondary | ICD-10-CM | POA: Diagnosis not present

## 2020-10-30 DIAGNOSIS — E1151 Type 2 diabetes mellitus with diabetic peripheral angiopathy without gangrene: Secondary | ICD-10-CM | POA: Diagnosis not present

## 2020-10-30 DIAGNOSIS — J9611 Chronic respiratory failure with hypoxia: Secondary | ICD-10-CM | POA: Diagnosis not present

## 2020-10-30 DIAGNOSIS — D62 Acute posthemorrhagic anemia: Secondary | ICD-10-CM | POA: Diagnosis not present

## 2020-10-30 DIAGNOSIS — E11622 Type 2 diabetes mellitus with other skin ulcer: Secondary | ICD-10-CM | POA: Diagnosis not present

## 2020-10-30 DIAGNOSIS — L97328 Non-pressure chronic ulcer of left ankle with other specified severity: Secondary | ICD-10-CM | POA: Diagnosis not present

## 2020-10-30 DIAGNOSIS — R2681 Unsteadiness on feet: Secondary | ICD-10-CM | POA: Diagnosis not present

## 2020-10-30 DIAGNOSIS — L97512 Non-pressure chronic ulcer of other part of right foot with fat layer exposed: Secondary | ICD-10-CM | POA: Diagnosis not present

## 2020-10-30 DIAGNOSIS — Z794 Long term (current) use of insulin: Secondary | ICD-10-CM | POA: Diagnosis not present

## 2020-11-05 DIAGNOSIS — D62 Acute posthemorrhagic anemia: Secondary | ICD-10-CM | POA: Diagnosis not present

## 2020-11-05 DIAGNOSIS — R531 Weakness: Secondary | ICD-10-CM | POA: Diagnosis not present

## 2020-11-05 DIAGNOSIS — L97512 Non-pressure chronic ulcer of other part of right foot with fat layer exposed: Secondary | ICD-10-CM | POA: Diagnosis not present

## 2020-11-05 DIAGNOSIS — E1151 Type 2 diabetes mellitus with diabetic peripheral angiopathy without gangrene: Secondary | ICD-10-CM | POA: Diagnosis not present

## 2020-11-05 DIAGNOSIS — Z794 Long term (current) use of insulin: Secondary | ICD-10-CM | POA: Diagnosis not present

## 2020-11-05 DIAGNOSIS — L97328 Non-pressure chronic ulcer of left ankle with other specified severity: Secondary | ICD-10-CM | POA: Diagnosis not present

## 2020-11-05 DIAGNOSIS — I739 Peripheral vascular disease, unspecified: Secondary | ICD-10-CM | POA: Diagnosis not present

## 2020-11-05 DIAGNOSIS — E11622 Type 2 diabetes mellitus with other skin ulcer: Secondary | ICD-10-CM | POA: Diagnosis not present

## 2020-11-05 DIAGNOSIS — J9611 Chronic respiratory failure with hypoxia: Secondary | ICD-10-CM | POA: Diagnosis not present

## 2020-11-05 DIAGNOSIS — R2681 Unsteadiness on feet: Secondary | ICD-10-CM | POA: Diagnosis not present

## 2020-11-06 DIAGNOSIS — E1151 Type 2 diabetes mellitus with diabetic peripheral angiopathy without gangrene: Secondary | ICD-10-CM | POA: Diagnosis not present

## 2020-11-06 DIAGNOSIS — L97512 Non-pressure chronic ulcer of other part of right foot with fat layer exposed: Secondary | ICD-10-CM | POA: Diagnosis not present

## 2020-11-06 DIAGNOSIS — R2681 Unsteadiness on feet: Secondary | ICD-10-CM | POA: Diagnosis not present

## 2020-11-06 DIAGNOSIS — R531 Weakness: Secondary | ICD-10-CM | POA: Diagnosis not present

## 2020-11-06 DIAGNOSIS — J9611 Chronic respiratory failure with hypoxia: Secondary | ICD-10-CM | POA: Diagnosis not present

## 2020-11-06 DIAGNOSIS — E11622 Type 2 diabetes mellitus with other skin ulcer: Secondary | ICD-10-CM | POA: Diagnosis not present

## 2020-11-06 DIAGNOSIS — I739 Peripheral vascular disease, unspecified: Secondary | ICD-10-CM | POA: Diagnosis not present

## 2020-11-06 DIAGNOSIS — Z794 Long term (current) use of insulin: Secondary | ICD-10-CM | POA: Diagnosis not present

## 2020-11-06 DIAGNOSIS — L97328 Non-pressure chronic ulcer of left ankle with other specified severity: Secondary | ICD-10-CM | POA: Diagnosis not present

## 2020-11-06 DIAGNOSIS — D62 Acute posthemorrhagic anemia: Secondary | ICD-10-CM | POA: Diagnosis not present

## 2020-11-11 DIAGNOSIS — E1151 Type 2 diabetes mellitus with diabetic peripheral angiopathy without gangrene: Secondary | ICD-10-CM | POA: Diagnosis not present

## 2020-11-11 DIAGNOSIS — E11622 Type 2 diabetes mellitus with other skin ulcer: Secondary | ICD-10-CM | POA: Diagnosis not present

## 2020-11-11 DIAGNOSIS — I739 Peripheral vascular disease, unspecified: Secondary | ICD-10-CM | POA: Diagnosis not present

## 2020-11-11 DIAGNOSIS — Z794 Long term (current) use of insulin: Secondary | ICD-10-CM | POA: Diagnosis not present

## 2020-11-11 DIAGNOSIS — R531 Weakness: Secondary | ICD-10-CM | POA: Diagnosis not present

## 2020-11-11 DIAGNOSIS — L97328 Non-pressure chronic ulcer of left ankle with other specified severity: Secondary | ICD-10-CM | POA: Diagnosis not present

## 2020-11-11 DIAGNOSIS — J9611 Chronic respiratory failure with hypoxia: Secondary | ICD-10-CM | POA: Diagnosis not present

## 2020-11-11 DIAGNOSIS — R2681 Unsteadiness on feet: Secondary | ICD-10-CM | POA: Diagnosis not present

## 2020-11-11 DIAGNOSIS — L97512 Non-pressure chronic ulcer of other part of right foot with fat layer exposed: Secondary | ICD-10-CM | POA: Diagnosis not present

## 2020-11-11 DIAGNOSIS — D62 Acute posthemorrhagic anemia: Secondary | ICD-10-CM | POA: Diagnosis not present

## 2020-11-13 ENCOUNTER — Telehealth: Payer: Self-pay | Admitting: *Deleted

## 2020-11-13 DIAGNOSIS — L97328 Non-pressure chronic ulcer of left ankle with other specified severity: Secondary | ICD-10-CM | POA: Diagnosis not present

## 2020-11-13 DIAGNOSIS — D62 Acute posthemorrhagic anemia: Secondary | ICD-10-CM | POA: Diagnosis not present

## 2020-11-13 DIAGNOSIS — L97512 Non-pressure chronic ulcer of other part of right foot with fat layer exposed: Secondary | ICD-10-CM | POA: Diagnosis not present

## 2020-11-13 DIAGNOSIS — E1151 Type 2 diabetes mellitus with diabetic peripheral angiopathy without gangrene: Secondary | ICD-10-CM | POA: Diagnosis not present

## 2020-11-13 DIAGNOSIS — I739 Peripheral vascular disease, unspecified: Secondary | ICD-10-CM | POA: Diagnosis not present

## 2020-11-13 DIAGNOSIS — E11622 Type 2 diabetes mellitus with other skin ulcer: Secondary | ICD-10-CM | POA: Diagnosis not present

## 2020-11-13 DIAGNOSIS — Z794 Long term (current) use of insulin: Secondary | ICD-10-CM | POA: Diagnosis not present

## 2020-11-13 DIAGNOSIS — J9611 Chronic respiratory failure with hypoxia: Secondary | ICD-10-CM | POA: Diagnosis not present

## 2020-11-13 DIAGNOSIS — R2681 Unsteadiness on feet: Secondary | ICD-10-CM | POA: Diagnosis not present

## 2020-11-13 DIAGNOSIS — R531 Weakness: Secondary | ICD-10-CM | POA: Diagnosis not present

## 2020-11-13 NOTE — Telephone Encounter (Signed)
Spoke with patients daughter about recent onset right hip pain. Pt is s/p right SFA and Popliteal stents. I do not think her hip pain is related to her procedure. Daughter asked about a gabapentin refill. This was not prescribed by our office. Advised daughter to contact PCP about the gabapentin.

## 2020-11-14 ENCOUNTER — Ambulatory Visit (INDEPENDENT_AMBULATORY_CARE_PROVIDER_SITE_OTHER): Payer: Medicare Other | Admitting: Physician Assistant

## 2020-11-14 ENCOUNTER — Other Ambulatory Visit: Payer: Self-pay

## 2020-11-14 VITALS — BP 185/72 | HR 71 | Temp 98.2°F | Resp 20 | Ht 62.0 in

## 2020-11-14 DIAGNOSIS — Z794 Long term (current) use of insulin: Secondary | ICD-10-CM | POA: Insufficient documentation

## 2020-11-14 DIAGNOSIS — I69319 Unspecified symptoms and signs involving cognitive functions following cerebral infarction: Secondary | ICD-10-CM | POA: Insufficient documentation

## 2020-11-14 DIAGNOSIS — L97529 Non-pressure chronic ulcer of other part of left foot with unspecified severity: Secondary | ICD-10-CM | POA: Insufficient documentation

## 2020-11-14 DIAGNOSIS — I69959 Hemiplegia and hemiparesis following unspecified cerebrovascular disease affecting unspecified side: Secondary | ICD-10-CM | POA: Insufficient documentation

## 2020-11-14 DIAGNOSIS — I739 Peripheral vascular disease, unspecified: Secondary | ICD-10-CM | POA: Diagnosis not present

## 2020-11-14 DIAGNOSIS — E1165 Type 2 diabetes mellitus with hyperglycemia: Secondary | ICD-10-CM | POA: Insufficient documentation

## 2020-11-14 DIAGNOSIS — M79604 Pain in right leg: Secondary | ICD-10-CM

## 2020-11-14 DIAGNOSIS — R413 Other amnesia: Secondary | ICD-10-CM | POA: Insufficient documentation

## 2020-11-14 DIAGNOSIS — E1139 Type 2 diabetes mellitus with other diabetic ophthalmic complication: Secondary | ICD-10-CM | POA: Insufficient documentation

## 2020-11-14 DIAGNOSIS — F17200 Nicotine dependence, unspecified, uncomplicated: Secondary | ICD-10-CM | POA: Insufficient documentation

## 2020-11-14 NOTE — Progress Notes (Signed)
Office Note     CC:  follow up Requesting Provider:  [name],*  HPI: Crystal Moore is a 75 y.o. ([birth year]) female who presents for evaluation of right leg pain.  Patient is accompanied by her daughter who is the primary historian during today's visit.  By the time of appointment the right lower extremity pain has resolved.  She described the pain as in her hip and radiating down her thigh to her knee.  She has known osteoarthritis of the right hip.  She denies any rest pain or nonhealing wounds of bilateral lower extremities.  She recently had a right SFA and popliteal stent placed by Dr. Stanford Breed on 08/30/2020.  She then had a left SFA and popliteal stent on 09/04/2020 also with Dr. Stanford Breed.  Based on arterial duplex and ABIs the interventions were both patent as of 09/24/2020.  The patient is on aspirin, Plavix, statin daily.  She is a tobacco user.  She is ambulatory with a walker.  Past Medical History:  Diagnosis Date  . Arthritis   . COPD (chronic obstructive pulmonary disease) ([place])   . COVID-19 virus infection 07/2020  . CVA (cerebral vascular accident) ([place])   . Diabetes mellitus without complication ([place])   . Hemiparesis affecting left side as late effect of cerebrovascular accident (CVA) ([place])   . Pyelonephritis   . Seizures ([place])   . Stroke ([place])   . Tobacco use disorder   . UTI (urinary tract infection)     Past Surgical History:  Procedure Laterality Date  . ABDOMINAL AORTOGRAM W/LOWER EXTREMITY N/A 08/30/2020   Procedure: ABDOMINAL AORTOGRAM W/LOWER EXTREMITY;  Surgeon: Cherre Robins, MD;  Location: [facility];  Service: Cardiovascular;  Laterality: N/A;  . ABDOMINAL AORTOGRAM W/LOWER EXTREMITY N/A 09/04/2020   Procedure: ABDOMINAL AORTOGRAM W/LOWER EXTREMITY;  Surgeon: Cherre Robins, MD;  Location: [facility];  Service: Cardiovascular;  Laterality: N/A;  . ESOPHAGOGASTRODUODENOSCOPY (EGD) WITH PROPOFOL N/A 09/16/2020   Procedure:  ESOPHAGOGASTRODUODENOSCOPY (EGD) WITH PROPOFOL;  Surgeon: Lavena Bullion, DO;  Location: [facility];  Service: Gastroenterology;  Laterality: N/A;  . HEMOSTASIS CLIP PLACEMENT  09/16/2020   Procedure: HEMOSTASIS CLIP PLACEMENT;  Surgeon: Lavena Bullion, DO;  Location: [facility];  Service: Gastroenterology;;  . HOT HEMOSTASIS N/A 09/16/2020   Procedure: HOT HEMOSTASIS (ARGON PLASMA COAGULATION/BICAP);  Surgeon: Lavena Bullion, DO;  Location[facility];  Service: Gastroenterology;  Laterality: N/A;  . PERIPHERAL VASCULAR INTERVENTION Right 08/30/2020   Procedure: PERIPHERAL VASCULAR INTERVENTION;  Surgeon: Cherre Robins, MD;  Location: [place] [facility];  Service: Cardiovascular;  Laterality: Right;  femoral popliteal  . PERIPHERAL VASCULAR INTERVENTION Left 09/04/2020   Procedure: PERIPHERAL VASCULAR INTERVENTION;  Surgeon: Cherre Robins, MD;  Location: [place] [facility];  Service: Cardiovascular;  Laterality: Left;  SFA  . TUBAL LIGATION      Social History   Socioeconomic History  . Marital status: Single    Spouse name: Not on file  . Number of children: Not on file  . Years of education: Not on file  . Highest education level: Not on file  Occupational History  . Not on file  Tobacco Use  . Smoking status: Current Every Day Smoker    Packs/day: 1.00    Years: 40.00    Pack years: 40.00    Types: Cigarettes  . Smokeless tobacco: Never Used  Vaping Use  . Vaping Use: Never used  Substance and Sexual Activity  . Alcohol  use: Never  . Drug use: Never  . Sexual activity: Not on file  Other Topics Concern  . Not on file  Social History Narrative   ** Merged History Encounter **   Right handed    Lives with family        Social Determinants of Health   Financial Resource Strain: Not on file  Food Insecurity: Not on file  Transportation Needs: Not on file  Physical Activity: Not on file  Stress: Not on file  Social Connections: Not on file  Intimate  Partner Violence: Not on file    Family History  Problem Relation Age of Onset  . Diabetes Mother   . Diabetes Sister   . Diabetes Brother     Current Outpatient Medications  Medication Sig Dispense Refill  . acetaminophen (TYLENOL) 500 MG tablet Take 1,000 mg by mouth every 6 (six) hours as needed (pain).    Marland Kitchen albuterol (PROVENTIL) (2.5 MG/3ML) 0.083% nebulizer solution Inhale 3 mLs (2.5 mg total) into the lungs every 4 (four) hours as needed for wheezing or shortness of breath. 75 mL 12  . albuterol (VENTOLIN HFA) 108 (90 Base) MCG/ACT inhaler Inhale 1 puff into the lungs every 6 (six) hours as needed for wheezing or shortness of breath.    Marland Kitchen aspirin 81 MG EC tablet Take 81 mg by mouth daily.    Marland Kitchen atorvastatin (LIPITOR) 80 MG tablet Take 1 tablet (80 mg total) by mouth daily at 6 PM. (Patient taking differently: Take 80 mg by mouth daily.) 30 tablet 0  . clopidogrel (PLAVIX) 75 MG tablet Take 1 tablet (75 mg total) by mouth daily. 30 tablet 11  . feeding supplement (ENSURE ENLIVE / ENSURE PLUS) LIQD Take 237 mLs by mouth 2 (two) times daily between meals. 237 mL 12  . gabapentin (NEURONTIN) 300 MG capsule Take 1 capsule (300 mg total) by mouth 3 (three) times daily. 30 capsule 3  . glucose blood (ACCU-CHEK GUIDE) test strip 1 each by Other route in the morning and at bedtime. And lancets 2/day 200 each 3  . insulin glargine (LANTUS) 100 UNIT/ML injection Inject 0.15 mLs (15 Units total) into the skin daily. 10 mL 11  . linagliptin (TRADJENTA) 5 MG TABS tablet Take 5 mg by mouth daily.    . pantoprazole (PROTONIX) 40 MG tablet Take 1 tablet (40 mg total) by mouth 2 (two) times daily. 84 tablet 0   No current facility-administered medications for this visit.    No Known Allergies   REVIEW OF SYSTEMS:   [X]  denotes positive finding, [ ]  denotes negative finding Cardiac  Comments:  Chest pain or chest pressure:    Shortness of breath upon exertion:    Short of breath when lying  flat:    Irregular heart rhythm:        Vascular    Pain in calf, thigh, or hip brought on by ambulation:    Pain in feet at night that wakes you up from your sleep:     Blood clot in your veins:    Leg swelling:         Pulmonary    Oxygen at home:    Productive cough:     Wheezing:         Neurologic    Sudden weakness in arms or legs:     Sudden numbness in arms or legs:     Sudden onset of difficulty speaking or slurred speech:    Temporary loss  of vision in one eye:     Problems with dizziness:         Gastrointestinal    Blood in stool:     Vomited blood:         Genitourinary    Burning when urinating:     Blood in urine:        Psychiatric    Major depression:         Hematologic    Bleeding problems:    Problems with blood clotting too easily:        Skin    Rashes or ulcers:        Constitutional    Fever or chills:      PHYSICAL EXAMINATION:  Vitals:   11/14/20 1529  BP: (!) 185/72  Pulse: 71  Resp: 20  Temp: 98.2 F (36.8 C)  TempSrc: Temporal  SpO2: 96%  Height: 5\' 2"  (1.575 m)    General:  WDWN in NAD; vital signs documented above Gait: Not observed HENT: WNL, normocephalic Pulmonary: normal non-labored breathing Cardiac: regular HR Abdomen: soft, NT, no masses Skin: without rashes Extremities: No reproducible right pain on exam; no tissue loss of bilateral lower extremities; palpable right DP pulse Musculoskeletal: no muscle wasting or atrophy  Neurologic: A&O X 3;  No focal weakness or paresthesias are detected Psychiatric:  The pt has Normal affect.    ASSESSMENT/PLAN:: 75 y.o. female here for evaluation of right lower extremity given recent history of SFA and popliteal stenting  -By the time of office visit today patient right lower extremity pain had completely resolved; her right foot is well-perfused with an easily palpable DP pulse -I encouraged the patient to try to be more active during the day and ambulate with her  walker -She should continue aspirin, Plavix, statin daily -Encouraged smoking cessation -She is due for repeat bilateral lower extremity arterial duplex and ABIs in about 5 months; she will call/return office sooner with any questions or concerns   Dagoberto Ligas, PA-C [facility] [phone]  Clinic MD:   Oneida Alar

## 2020-11-18 DIAGNOSIS — J9611 Chronic respiratory failure with hypoxia: Secondary | ICD-10-CM | POA: Diagnosis not present

## 2020-11-18 DIAGNOSIS — L97328 Non-pressure chronic ulcer of left ankle with other specified severity: Secondary | ICD-10-CM | POA: Diagnosis not present

## 2020-11-18 DIAGNOSIS — R2681 Unsteadiness on feet: Secondary | ICD-10-CM | POA: Diagnosis not present

## 2020-11-18 DIAGNOSIS — L97512 Non-pressure chronic ulcer of other part of right foot with fat layer exposed: Secondary | ICD-10-CM | POA: Diagnosis not present

## 2020-11-18 DIAGNOSIS — E1151 Type 2 diabetes mellitus with diabetic peripheral angiopathy without gangrene: Secondary | ICD-10-CM | POA: Diagnosis not present

## 2020-11-18 DIAGNOSIS — Z794 Long term (current) use of insulin: Secondary | ICD-10-CM | POA: Diagnosis not present

## 2020-11-18 DIAGNOSIS — E11622 Type 2 diabetes mellitus with other skin ulcer: Secondary | ICD-10-CM | POA: Diagnosis not present

## 2020-11-18 DIAGNOSIS — R531 Weakness: Secondary | ICD-10-CM | POA: Diagnosis not present

## 2020-11-18 DIAGNOSIS — D62 Acute posthemorrhagic anemia: Secondary | ICD-10-CM | POA: Diagnosis not present

## 2020-11-18 DIAGNOSIS — I739 Peripheral vascular disease, unspecified: Secondary | ICD-10-CM | POA: Diagnosis not present

## 2020-11-19 DIAGNOSIS — D62 Acute posthemorrhagic anemia: Secondary | ICD-10-CM | POA: Diagnosis not present

## 2020-11-19 DIAGNOSIS — E11622 Type 2 diabetes mellitus with other skin ulcer: Secondary | ICD-10-CM | POA: Diagnosis not present

## 2020-11-19 DIAGNOSIS — E1151 Type 2 diabetes mellitus with diabetic peripheral angiopathy without gangrene: Secondary | ICD-10-CM | POA: Diagnosis not present

## 2020-11-19 DIAGNOSIS — Z794 Long term (current) use of insulin: Secondary | ICD-10-CM | POA: Diagnosis not present

## 2020-11-19 DIAGNOSIS — I739 Peripheral vascular disease, unspecified: Secondary | ICD-10-CM | POA: Diagnosis not present

## 2020-11-19 DIAGNOSIS — R2681 Unsteadiness on feet: Secondary | ICD-10-CM | POA: Diagnosis not present

## 2020-11-19 DIAGNOSIS — L97512 Non-pressure chronic ulcer of other part of right foot with fat layer exposed: Secondary | ICD-10-CM | POA: Diagnosis not present

## 2020-11-19 DIAGNOSIS — R531 Weakness: Secondary | ICD-10-CM | POA: Diagnosis not present

## 2020-11-19 DIAGNOSIS — J9611 Chronic respiratory failure with hypoxia: Secondary | ICD-10-CM | POA: Diagnosis not present

## 2020-11-19 DIAGNOSIS — L97328 Non-pressure chronic ulcer of left ankle with other specified severity: Secondary | ICD-10-CM | POA: Diagnosis not present

## 2020-11-20 DIAGNOSIS — I6350 Cerebral infarction due to unspecified occlusion or stenosis of unspecified cerebral artery: Secondary | ICD-10-CM | POA: Diagnosis not present

## 2020-11-25 DIAGNOSIS — R2681 Unsteadiness on feet: Secondary | ICD-10-CM | POA: Diagnosis not present

## 2020-11-25 DIAGNOSIS — E1151 Type 2 diabetes mellitus with diabetic peripheral angiopathy without gangrene: Secondary | ICD-10-CM | POA: Diagnosis not present

## 2020-11-25 DIAGNOSIS — E11622 Type 2 diabetes mellitus with other skin ulcer: Secondary | ICD-10-CM | POA: Diagnosis not present

## 2020-11-25 DIAGNOSIS — I739 Peripheral vascular disease, unspecified: Secondary | ICD-10-CM | POA: Diagnosis not present

## 2020-11-25 DIAGNOSIS — J9611 Chronic respiratory failure with hypoxia: Secondary | ICD-10-CM | POA: Diagnosis not present

## 2020-11-25 DIAGNOSIS — Z794 Long term (current) use of insulin: Secondary | ICD-10-CM | POA: Diagnosis not present

## 2020-11-25 DIAGNOSIS — J449 Chronic obstructive pulmonary disease, unspecified: Secondary | ICD-10-CM | POA: Diagnosis not present

## 2020-11-25 DIAGNOSIS — L97328 Non-pressure chronic ulcer of left ankle with other specified severity: Secondary | ICD-10-CM | POA: Diagnosis not present

## 2020-11-25 DIAGNOSIS — R531 Weakness: Secondary | ICD-10-CM | POA: Diagnosis not present

## 2020-11-26 ENCOUNTER — Encounter (HOSPITAL_BASED_OUTPATIENT_CLINIC_OR_DEPARTMENT_OTHER): Payer: Medicare Other | Attending: Internal Medicine | Admitting: Internal Medicine

## 2020-11-26 DIAGNOSIS — J439 Emphysema, unspecified: Secondary | ICD-10-CM | POA: Diagnosis not present

## 2020-11-27 DIAGNOSIS — J449 Chronic obstructive pulmonary disease, unspecified: Secondary | ICD-10-CM | POA: Diagnosis not present

## 2020-11-27 DIAGNOSIS — E11622 Type 2 diabetes mellitus with other skin ulcer: Secondary | ICD-10-CM | POA: Diagnosis not present

## 2020-11-27 DIAGNOSIS — J9611 Chronic respiratory failure with hypoxia: Secondary | ICD-10-CM | POA: Diagnosis not present

## 2020-11-27 DIAGNOSIS — I739 Peripheral vascular disease, unspecified: Secondary | ICD-10-CM | POA: Diagnosis not present

## 2020-11-27 DIAGNOSIS — E1151 Type 2 diabetes mellitus with diabetic peripheral angiopathy without gangrene: Secondary | ICD-10-CM | POA: Diagnosis not present

## 2020-11-27 DIAGNOSIS — R531 Weakness: Secondary | ICD-10-CM | POA: Diagnosis not present

## 2020-11-27 DIAGNOSIS — R2681 Unsteadiness on feet: Secondary | ICD-10-CM | POA: Diagnosis not present

## 2020-11-27 DIAGNOSIS — Z794 Long term (current) use of insulin: Secondary | ICD-10-CM | POA: Diagnosis not present

## 2020-11-27 DIAGNOSIS — L97328 Non-pressure chronic ulcer of left ankle with other specified severity: Secondary | ICD-10-CM | POA: Diagnosis not present

## 2020-12-02 DIAGNOSIS — Z794 Long term (current) use of insulin: Secondary | ICD-10-CM | POA: Diagnosis not present

## 2020-12-02 DIAGNOSIS — J9611 Chronic respiratory failure with hypoxia: Secondary | ICD-10-CM | POA: Diagnosis not present

## 2020-12-02 DIAGNOSIS — E1151 Type 2 diabetes mellitus with diabetic peripheral angiopathy without gangrene: Secondary | ICD-10-CM | POA: Diagnosis not present

## 2020-12-02 DIAGNOSIS — I739 Peripheral vascular disease, unspecified: Secondary | ICD-10-CM | POA: Diagnosis not present

## 2020-12-02 DIAGNOSIS — R531 Weakness: Secondary | ICD-10-CM | POA: Diagnosis not present

## 2020-12-02 DIAGNOSIS — J449 Chronic obstructive pulmonary disease, unspecified: Secondary | ICD-10-CM | POA: Diagnosis not present

## 2020-12-02 DIAGNOSIS — E11622 Type 2 diabetes mellitus with other skin ulcer: Secondary | ICD-10-CM | POA: Diagnosis not present

## 2020-12-02 DIAGNOSIS — L97328 Non-pressure chronic ulcer of left ankle with other specified severity: Secondary | ICD-10-CM | POA: Diagnosis not present

## 2020-12-02 DIAGNOSIS — R2681 Unsteadiness on feet: Secondary | ICD-10-CM | POA: Diagnosis not present

## 2020-12-06 DIAGNOSIS — I739 Peripheral vascular disease, unspecified: Secondary | ICD-10-CM | POA: Diagnosis not present

## 2020-12-06 DIAGNOSIS — E11622 Type 2 diabetes mellitus with other skin ulcer: Secondary | ICD-10-CM | POA: Diagnosis not present

## 2020-12-06 DIAGNOSIS — E1139 Type 2 diabetes mellitus with other diabetic ophthalmic complication: Secondary | ICD-10-CM | POA: Diagnosis not present

## 2020-12-06 DIAGNOSIS — R2681 Unsteadiness on feet: Secondary | ICD-10-CM | POA: Diagnosis not present

## 2020-12-06 DIAGNOSIS — Z794 Long term (current) use of insulin: Secondary | ICD-10-CM | POA: Diagnosis not present

## 2020-12-06 DIAGNOSIS — I69354 Hemiplegia and hemiparesis following cerebral infarction affecting left non-dominant side: Secondary | ICD-10-CM | POA: Diagnosis not present

## 2020-12-06 DIAGNOSIS — I10 Essential (primary) hypertension: Secondary | ICD-10-CM | POA: Diagnosis not present

## 2020-12-06 DIAGNOSIS — J9611 Chronic respiratory failure with hypoxia: Secondary | ICD-10-CM | POA: Diagnosis not present

## 2020-12-06 DIAGNOSIS — E1151 Type 2 diabetes mellitus with diabetic peripheral angiopathy without gangrene: Secondary | ICD-10-CM | POA: Diagnosis not present

## 2020-12-06 DIAGNOSIS — F17200 Nicotine dependence, unspecified, uncomplicated: Secondary | ICD-10-CM | POA: Diagnosis not present

## 2020-12-06 DIAGNOSIS — J449 Chronic obstructive pulmonary disease, unspecified: Secondary | ICD-10-CM | POA: Diagnosis not present

## 2020-12-06 DIAGNOSIS — R531 Weakness: Secondary | ICD-10-CM | POA: Diagnosis not present

## 2020-12-06 DIAGNOSIS — L97328 Non-pressure chronic ulcer of left ankle with other specified severity: Secondary | ICD-10-CM | POA: Diagnosis not present

## 2020-12-17 DIAGNOSIS — Z794 Long term (current) use of insulin: Secondary | ICD-10-CM | POA: Diagnosis not present

## 2020-12-17 DIAGNOSIS — J449 Chronic obstructive pulmonary disease, unspecified: Secondary | ICD-10-CM | POA: Diagnosis not present

## 2020-12-17 DIAGNOSIS — R2681 Unsteadiness on feet: Secondary | ICD-10-CM | POA: Diagnosis not present

## 2020-12-17 DIAGNOSIS — R531 Weakness: Secondary | ICD-10-CM | POA: Diagnosis not present

## 2020-12-17 DIAGNOSIS — I739 Peripheral vascular disease, unspecified: Secondary | ICD-10-CM | POA: Diagnosis not present

## 2020-12-17 DIAGNOSIS — E11622 Type 2 diabetes mellitus with other skin ulcer: Secondary | ICD-10-CM | POA: Diagnosis not present

## 2020-12-17 DIAGNOSIS — L97328 Non-pressure chronic ulcer of left ankle with other specified severity: Secondary | ICD-10-CM | POA: Diagnosis not present

## 2020-12-17 DIAGNOSIS — E1151 Type 2 diabetes mellitus with diabetic peripheral angiopathy without gangrene: Secondary | ICD-10-CM | POA: Diagnosis not present

## 2020-12-17 DIAGNOSIS — J9611 Chronic respiratory failure with hypoxia: Secondary | ICD-10-CM | POA: Diagnosis not present

## 2020-12-19 DIAGNOSIS — I6350 Cerebral infarction due to unspecified occlusion or stenosis of unspecified cerebral artery: Secondary | ICD-10-CM | POA: Diagnosis not present

## 2020-12-24 DIAGNOSIS — Z794 Long term (current) use of insulin: Secondary | ICD-10-CM | POA: Diagnosis not present

## 2020-12-24 DIAGNOSIS — E1151 Type 2 diabetes mellitus with diabetic peripheral angiopathy without gangrene: Secondary | ICD-10-CM | POA: Diagnosis not present

## 2020-12-24 DIAGNOSIS — E11622 Type 2 diabetes mellitus with other skin ulcer: Secondary | ICD-10-CM | POA: Diagnosis not present

## 2020-12-24 DIAGNOSIS — J449 Chronic obstructive pulmonary disease, unspecified: Secondary | ICD-10-CM | POA: Diagnosis not present

## 2020-12-24 DIAGNOSIS — E1165 Type 2 diabetes mellitus with hyperglycemia: Secondary | ICD-10-CM | POA: Diagnosis not present

## 2020-12-24 DIAGNOSIS — J9611 Chronic respiratory failure with hypoxia: Secondary | ICD-10-CM | POA: Diagnosis not present

## 2020-12-24 DIAGNOSIS — L97328 Non-pressure chronic ulcer of left ankle with other specified severity: Secondary | ICD-10-CM | POA: Diagnosis not present

## 2020-12-24 DIAGNOSIS — E1139 Type 2 diabetes mellitus with other diabetic ophthalmic complication: Secondary | ICD-10-CM | POA: Diagnosis not present

## 2020-12-24 DIAGNOSIS — R531 Weakness: Secondary | ICD-10-CM | POA: Diagnosis not present

## 2020-12-24 DIAGNOSIS — R2681 Unsteadiness on feet: Secondary | ICD-10-CM | POA: Diagnosis not present

## 2020-12-24 DIAGNOSIS — E11621 Type 2 diabetes mellitus with foot ulcer: Secondary | ICD-10-CM | POA: Diagnosis not present

## 2020-12-24 DIAGNOSIS — I739 Peripheral vascular disease, unspecified: Secondary | ICD-10-CM | POA: Diagnosis not present

## 2020-12-24 DIAGNOSIS — I10 Essential (primary) hypertension: Secondary | ICD-10-CM | POA: Diagnosis not present

## 2020-12-27 DIAGNOSIS — I739 Peripheral vascular disease, unspecified: Secondary | ICD-10-CM | POA: Diagnosis not present

## 2020-12-27 DIAGNOSIS — J439 Emphysema, unspecified: Secondary | ICD-10-CM | POA: Diagnosis not present

## 2020-12-27 DIAGNOSIS — J9611 Chronic respiratory failure with hypoxia: Secondary | ICD-10-CM | POA: Diagnosis not present

## 2020-12-27 DIAGNOSIS — R2681 Unsteadiness on feet: Secondary | ICD-10-CM | POA: Diagnosis not present

## 2020-12-27 DIAGNOSIS — E1151 Type 2 diabetes mellitus with diabetic peripheral angiopathy without gangrene: Secondary | ICD-10-CM | POA: Diagnosis not present

## 2020-12-27 DIAGNOSIS — L97328 Non-pressure chronic ulcer of left ankle with other specified severity: Secondary | ICD-10-CM | POA: Diagnosis not present

## 2020-12-27 DIAGNOSIS — E11622 Type 2 diabetes mellitus with other skin ulcer: Secondary | ICD-10-CM | POA: Diagnosis not present

## 2020-12-27 DIAGNOSIS — Z794 Long term (current) use of insulin: Secondary | ICD-10-CM | POA: Diagnosis not present

## 2020-12-27 DIAGNOSIS — R531 Weakness: Secondary | ICD-10-CM | POA: Diagnosis not present

## 2020-12-27 DIAGNOSIS — J449 Chronic obstructive pulmonary disease, unspecified: Secondary | ICD-10-CM | POA: Diagnosis not present

## 2020-12-30 DIAGNOSIS — Z794 Long term (current) use of insulin: Secondary | ICD-10-CM | POA: Diagnosis not present

## 2020-12-30 DIAGNOSIS — R2681 Unsteadiness on feet: Secondary | ICD-10-CM | POA: Diagnosis not present

## 2020-12-30 DIAGNOSIS — I10 Essential (primary) hypertension: Secondary | ICD-10-CM | POA: Diagnosis not present

## 2020-12-30 DIAGNOSIS — J449 Chronic obstructive pulmonary disease, unspecified: Secondary | ICD-10-CM | POA: Diagnosis not present

## 2020-12-30 DIAGNOSIS — E1165 Type 2 diabetes mellitus with hyperglycemia: Secondary | ICD-10-CM | POA: Diagnosis not present

## 2020-12-30 DIAGNOSIS — I739 Peripheral vascular disease, unspecified: Secondary | ICD-10-CM | POA: Diagnosis not present

## 2020-12-30 DIAGNOSIS — R531 Weakness: Secondary | ICD-10-CM | POA: Diagnosis not present

## 2020-12-30 DIAGNOSIS — E1139 Type 2 diabetes mellitus with other diabetic ophthalmic complication: Secondary | ICD-10-CM | POA: Diagnosis not present

## 2020-12-30 DIAGNOSIS — J9611 Chronic respiratory failure with hypoxia: Secondary | ICD-10-CM | POA: Diagnosis not present

## 2020-12-30 DIAGNOSIS — E1151 Type 2 diabetes mellitus with diabetic peripheral angiopathy without gangrene: Secondary | ICD-10-CM | POA: Diagnosis not present

## 2020-12-30 DIAGNOSIS — E11621 Type 2 diabetes mellitus with foot ulcer: Secondary | ICD-10-CM | POA: Diagnosis not present

## 2020-12-30 DIAGNOSIS — L97328 Non-pressure chronic ulcer of left ankle with other specified severity: Secondary | ICD-10-CM | POA: Diagnosis not present

## 2020-12-30 DIAGNOSIS — E11622 Type 2 diabetes mellitus with other skin ulcer: Secondary | ICD-10-CM | POA: Diagnosis not present

## 2021-01-02 DIAGNOSIS — J449 Chronic obstructive pulmonary disease, unspecified: Secondary | ICD-10-CM | POA: Diagnosis not present

## 2021-01-02 DIAGNOSIS — I739 Peripheral vascular disease, unspecified: Secondary | ICD-10-CM | POA: Diagnosis not present

## 2021-01-02 DIAGNOSIS — L97328 Non-pressure chronic ulcer of left ankle with other specified severity: Secondary | ICD-10-CM | POA: Diagnosis not present

## 2021-01-02 DIAGNOSIS — Z794 Long term (current) use of insulin: Secondary | ICD-10-CM | POA: Diagnosis not present

## 2021-01-02 DIAGNOSIS — R531 Weakness: Secondary | ICD-10-CM | POA: Diagnosis not present

## 2021-01-02 DIAGNOSIS — J9611 Chronic respiratory failure with hypoxia: Secondary | ICD-10-CM | POA: Diagnosis not present

## 2021-01-02 DIAGNOSIS — E1151 Type 2 diabetes mellitus with diabetic peripheral angiopathy without gangrene: Secondary | ICD-10-CM | POA: Diagnosis not present

## 2021-01-02 DIAGNOSIS — E11622 Type 2 diabetes mellitus with other skin ulcer: Secondary | ICD-10-CM | POA: Diagnosis not present

## 2021-01-02 DIAGNOSIS — R2681 Unsteadiness on feet: Secondary | ICD-10-CM | POA: Diagnosis not present

## 2021-01-06 DIAGNOSIS — I6350 Cerebral infarction due to unspecified occlusion or stenosis of unspecified cerebral artery: Secondary | ICD-10-CM | POA: Diagnosis not present

## 2021-01-07 DIAGNOSIS — E11622 Type 2 diabetes mellitus with other skin ulcer: Secondary | ICD-10-CM | POA: Diagnosis not present

## 2021-01-07 DIAGNOSIS — J9611 Chronic respiratory failure with hypoxia: Secondary | ICD-10-CM | POA: Diagnosis not present

## 2021-01-07 DIAGNOSIS — R531 Weakness: Secondary | ICD-10-CM | POA: Diagnosis not present

## 2021-01-07 DIAGNOSIS — E1151 Type 2 diabetes mellitus with diabetic peripheral angiopathy without gangrene: Secondary | ICD-10-CM | POA: Diagnosis not present

## 2021-01-07 DIAGNOSIS — Z794 Long term (current) use of insulin: Secondary | ICD-10-CM | POA: Diagnosis not present

## 2021-01-07 DIAGNOSIS — I739 Peripheral vascular disease, unspecified: Secondary | ICD-10-CM | POA: Diagnosis not present

## 2021-01-07 DIAGNOSIS — R2681 Unsteadiness on feet: Secondary | ICD-10-CM | POA: Diagnosis not present

## 2021-01-07 DIAGNOSIS — J449 Chronic obstructive pulmonary disease, unspecified: Secondary | ICD-10-CM | POA: Diagnosis not present

## 2021-01-07 DIAGNOSIS — L97328 Non-pressure chronic ulcer of left ankle with other specified severity: Secondary | ICD-10-CM | POA: Diagnosis not present

## 2021-01-09 DIAGNOSIS — I739 Peripheral vascular disease, unspecified: Secondary | ICD-10-CM | POA: Diagnosis not present

## 2021-01-09 DIAGNOSIS — E11622 Type 2 diabetes mellitus with other skin ulcer: Secondary | ICD-10-CM | POA: Diagnosis not present

## 2021-01-09 DIAGNOSIS — E1151 Type 2 diabetes mellitus with diabetic peripheral angiopathy without gangrene: Secondary | ICD-10-CM | POA: Diagnosis not present

## 2021-01-09 DIAGNOSIS — L97328 Non-pressure chronic ulcer of left ankle with other specified severity: Secondary | ICD-10-CM | POA: Diagnosis not present

## 2021-01-09 DIAGNOSIS — R2681 Unsteadiness on feet: Secondary | ICD-10-CM | POA: Diagnosis not present

## 2021-01-09 DIAGNOSIS — J9611 Chronic respiratory failure with hypoxia: Secondary | ICD-10-CM | POA: Diagnosis not present

## 2021-01-09 DIAGNOSIS — J449 Chronic obstructive pulmonary disease, unspecified: Secondary | ICD-10-CM | POA: Diagnosis not present

## 2021-01-09 DIAGNOSIS — R531 Weakness: Secondary | ICD-10-CM | POA: Diagnosis not present

## 2021-01-09 DIAGNOSIS — Z794 Long term (current) use of insulin: Secondary | ICD-10-CM | POA: Diagnosis not present

## 2021-01-14 DIAGNOSIS — R2681 Unsteadiness on feet: Secondary | ICD-10-CM | POA: Diagnosis not present

## 2021-01-14 DIAGNOSIS — R531 Weakness: Secondary | ICD-10-CM | POA: Diagnosis not present

## 2021-01-14 DIAGNOSIS — E11622 Type 2 diabetes mellitus with other skin ulcer: Secondary | ICD-10-CM | POA: Diagnosis not present

## 2021-01-14 DIAGNOSIS — J9611 Chronic respiratory failure with hypoxia: Secondary | ICD-10-CM | POA: Diagnosis not present

## 2021-01-14 DIAGNOSIS — I739 Peripheral vascular disease, unspecified: Secondary | ICD-10-CM | POA: Diagnosis not present

## 2021-01-14 DIAGNOSIS — L97328 Non-pressure chronic ulcer of left ankle with other specified severity: Secondary | ICD-10-CM | POA: Diagnosis not present

## 2021-01-14 DIAGNOSIS — E1151 Type 2 diabetes mellitus with diabetic peripheral angiopathy without gangrene: Secondary | ICD-10-CM | POA: Diagnosis not present

## 2021-01-14 DIAGNOSIS — Z794 Long term (current) use of insulin: Secondary | ICD-10-CM | POA: Diagnosis not present

## 2021-01-14 DIAGNOSIS — J449 Chronic obstructive pulmonary disease, unspecified: Secondary | ICD-10-CM | POA: Diagnosis not present

## 2021-01-16 DIAGNOSIS — Z794 Long term (current) use of insulin: Secondary | ICD-10-CM | POA: Diagnosis not present

## 2021-01-16 DIAGNOSIS — L97328 Non-pressure chronic ulcer of left ankle with other specified severity: Secondary | ICD-10-CM | POA: Diagnosis not present

## 2021-01-16 DIAGNOSIS — J9611 Chronic respiratory failure with hypoxia: Secondary | ICD-10-CM | POA: Diagnosis not present

## 2021-01-16 DIAGNOSIS — J449 Chronic obstructive pulmonary disease, unspecified: Secondary | ICD-10-CM | POA: Diagnosis not present

## 2021-01-16 DIAGNOSIS — I739 Peripheral vascular disease, unspecified: Secondary | ICD-10-CM | POA: Diagnosis not present

## 2021-01-16 DIAGNOSIS — R2681 Unsteadiness on feet: Secondary | ICD-10-CM | POA: Diagnosis not present

## 2021-01-16 DIAGNOSIS — E11622 Type 2 diabetes mellitus with other skin ulcer: Secondary | ICD-10-CM | POA: Diagnosis not present

## 2021-01-16 DIAGNOSIS — R531 Weakness: Secondary | ICD-10-CM | POA: Diagnosis not present

## 2021-01-16 DIAGNOSIS — E1151 Type 2 diabetes mellitus with diabetic peripheral angiopathy without gangrene: Secondary | ICD-10-CM | POA: Diagnosis not present

## 2021-01-17 DIAGNOSIS — E11622 Type 2 diabetes mellitus with other skin ulcer: Secondary | ICD-10-CM | POA: Diagnosis not present

## 2021-01-21 DIAGNOSIS — R2681 Unsteadiness on feet: Secondary | ICD-10-CM | POA: Diagnosis not present

## 2021-01-21 DIAGNOSIS — E11622 Type 2 diabetes mellitus with other skin ulcer: Secondary | ICD-10-CM | POA: Diagnosis not present

## 2021-01-21 DIAGNOSIS — R531 Weakness: Secondary | ICD-10-CM | POA: Diagnosis not present

## 2021-01-21 DIAGNOSIS — I739 Peripheral vascular disease, unspecified: Secondary | ICD-10-CM | POA: Diagnosis not present

## 2021-01-21 DIAGNOSIS — L97328 Non-pressure chronic ulcer of left ankle with other specified severity: Secondary | ICD-10-CM | POA: Diagnosis not present

## 2021-01-21 DIAGNOSIS — J9611 Chronic respiratory failure with hypoxia: Secondary | ICD-10-CM | POA: Diagnosis not present

## 2021-01-21 DIAGNOSIS — J449 Chronic obstructive pulmonary disease, unspecified: Secondary | ICD-10-CM | POA: Diagnosis not present

## 2021-01-21 DIAGNOSIS — E1151 Type 2 diabetes mellitus with diabetic peripheral angiopathy without gangrene: Secondary | ICD-10-CM | POA: Diagnosis not present

## 2021-01-21 DIAGNOSIS — Z794 Long term (current) use of insulin: Secondary | ICD-10-CM | POA: Diagnosis not present

## 2021-01-26 DIAGNOSIS — J439 Emphysema, unspecified: Secondary | ICD-10-CM | POA: Diagnosis not present

## 2021-01-28 DIAGNOSIS — E1139 Type 2 diabetes mellitus with other diabetic ophthalmic complication: Secondary | ICD-10-CM | POA: Diagnosis not present

## 2021-01-28 DIAGNOSIS — D500 Iron deficiency anemia secondary to blood loss (chronic): Secondary | ICD-10-CM | POA: Diagnosis not present

## 2021-01-28 DIAGNOSIS — E11621 Type 2 diabetes mellitus with foot ulcer: Secondary | ICD-10-CM | POA: Diagnosis not present

## 2021-01-28 DIAGNOSIS — I10 Essential (primary) hypertension: Secondary | ICD-10-CM | POA: Diagnosis not present

## 2021-01-28 DIAGNOSIS — J9611 Chronic respiratory failure with hypoxia: Secondary | ICD-10-CM | POA: Diagnosis not present

## 2021-01-28 DIAGNOSIS — Z794 Long term (current) use of insulin: Secondary | ICD-10-CM | POA: Diagnosis not present

## 2021-01-28 DIAGNOSIS — E1165 Type 2 diabetes mellitus with hyperglycemia: Secondary | ICD-10-CM | POA: Diagnosis not present

## 2021-01-28 DIAGNOSIS — R2681 Unsteadiness on feet: Secondary | ICD-10-CM | POA: Diagnosis not present

## 2021-01-28 DIAGNOSIS — E11622 Type 2 diabetes mellitus with other skin ulcer: Secondary | ICD-10-CM | POA: Diagnosis not present

## 2021-01-28 DIAGNOSIS — E1151 Type 2 diabetes mellitus with diabetic peripheral angiopathy without gangrene: Secondary | ICD-10-CM | POA: Diagnosis not present

## 2021-01-28 DIAGNOSIS — R531 Weakness: Secondary | ICD-10-CM | POA: Diagnosis not present

## 2021-01-28 DIAGNOSIS — J449 Chronic obstructive pulmonary disease, unspecified: Secondary | ICD-10-CM | POA: Diagnosis not present

## 2021-01-28 DIAGNOSIS — I6350 Cerebral infarction due to unspecified occlusion or stenosis of unspecified cerebral artery: Secondary | ICD-10-CM | POA: Diagnosis not present

## 2021-01-28 DIAGNOSIS — I739 Peripheral vascular disease, unspecified: Secondary | ICD-10-CM | POA: Diagnosis not present

## 2021-01-28 DIAGNOSIS — L97328 Non-pressure chronic ulcer of left ankle with other specified severity: Secondary | ICD-10-CM | POA: Diagnosis not present

## 2021-01-30 ENCOUNTER — Emergency Department (HOSPITAL_COMMUNITY)
Admission: EM | Admit: 2021-01-30 | Discharge: 2021-01-30 | Disposition: A | Discharge status: Home / Self Care | Payer: Medicare Other | Attending: Emergency Medicine | Admitting: Emergency Medicine

## 2021-01-30 ENCOUNTER — Encounter (HOSPITAL_COMMUNITY): Payer: Self-pay

## 2021-01-30 ENCOUNTER — Emergency Department (HOSPITAL_COMMUNITY): Payer: Medicare Other

## 2021-01-30 ENCOUNTER — Other Ambulatory Visit: Payer: Self-pay

## 2021-01-30 DIAGNOSIS — R739 Hyperglycemia, unspecified: Secondary | ICD-10-CM

## 2021-01-30 DIAGNOSIS — L97328 Non-pressure chronic ulcer of left ankle with other specified severity: Secondary | ICD-10-CM | POA: Diagnosis not present

## 2021-01-30 DIAGNOSIS — Z794 Long term (current) use of insulin: Secondary | ICD-10-CM | POA: Insufficient documentation

## 2021-01-30 DIAGNOSIS — E1165 Type 2 diabetes mellitus with hyperglycemia: Secondary | ICD-10-CM | POA: Insufficient documentation

## 2021-01-30 DIAGNOSIS — Z79899 Other long term (current) drug therapy: Secondary | ICD-10-CM | POA: Insufficient documentation

## 2021-01-30 DIAGNOSIS — Z8616 Personal history of COVID-19: Secondary | ICD-10-CM | POA: Diagnosis not present

## 2021-01-30 DIAGNOSIS — J449 Chronic obstructive pulmonary disease, unspecified: Secondary | ICD-10-CM | POA: Insufficient documentation

## 2021-01-30 DIAGNOSIS — Z7982 Long term (current) use of aspirin: Secondary | ICD-10-CM | POA: Insufficient documentation

## 2021-01-30 DIAGNOSIS — R791 Abnormal coagulation profile: Secondary | ICD-10-CM | POA: Insufficient documentation

## 2021-01-30 DIAGNOSIS — Z743 Need for continuous supervision: Secondary | ICD-10-CM | POA: Diagnosis not present

## 2021-01-30 DIAGNOSIS — R531 Weakness: Secondary | ICD-10-CM | POA: Diagnosis not present

## 2021-01-30 DIAGNOSIS — R519 Headache, unspecified: Secondary | ICD-10-CM | POA: Diagnosis not present

## 2021-01-30 DIAGNOSIS — I10 Essential (primary) hypertension: Secondary | ICD-10-CM | POA: Insufficient documentation

## 2021-01-30 DIAGNOSIS — J45909 Unspecified asthma, uncomplicated: Secondary | ICD-10-CM | POA: Diagnosis not present

## 2021-01-30 DIAGNOSIS — Z20822 Contact with and (suspected) exposure to covid-19: Secondary | ICD-10-CM | POA: Diagnosis not present

## 2021-01-30 DIAGNOSIS — Z7902 Long term (current) use of antithrombotics/antiplatelets: Secondary | ICD-10-CM | POA: Diagnosis not present

## 2021-01-30 DIAGNOSIS — F0390 Unspecified dementia without behavioral disturbance: Secondary | ICD-10-CM | POA: Insufficient documentation

## 2021-01-30 DIAGNOSIS — F17210 Nicotine dependence, cigarettes, uncomplicated: Secondary | ICD-10-CM | POA: Insufficient documentation

## 2021-01-30 DIAGNOSIS — R2681 Unsteadiness on feet: Secondary | ICD-10-CM | POA: Diagnosis not present

## 2021-01-30 DIAGNOSIS — Y900 Blood alcohol level of less than 20 mg/100 ml: Secondary | ICD-10-CM | POA: Insufficient documentation

## 2021-01-30 DIAGNOSIS — R404 Transient alteration of awareness: Secondary | ICD-10-CM | POA: Diagnosis not present

## 2021-01-30 DIAGNOSIS — J9611 Chronic respiratory failure with hypoxia: Secondary | ICD-10-CM | POA: Diagnosis not present

## 2021-01-30 DIAGNOSIS — E11622 Type 2 diabetes mellitus with other skin ulcer: Secondary | ICD-10-CM | POA: Diagnosis not present

## 2021-01-30 DIAGNOSIS — Z7984 Long term (current) use of oral hypoglycemic drugs: Secondary | ICD-10-CM | POA: Diagnosis not present

## 2021-01-30 DIAGNOSIS — I739 Peripheral vascular disease, unspecified: Secondary | ICD-10-CM | POA: Diagnosis not present

## 2021-01-30 DIAGNOSIS — E1151 Type 2 diabetes mellitus with diabetic peripheral angiopathy without gangrene: Secondary | ICD-10-CM | POA: Diagnosis not present

## 2021-01-30 DIAGNOSIS — R6889 Other general symptoms and signs: Secondary | ICD-10-CM | POA: Diagnosis not present

## 2021-01-30 LAB — URINALYSIS, ROUTINE W REFLEX MICROSCOPIC
Bilirubin Urine: NEGATIVE
Glucose, UA: >=500 mg/dL — AB
Hgb urine dipstick: NEGATIVE
Ketones, ur: NEGATIVE mg/dL
Leukocytes,Ua: NEGATIVE
Nitrite: NEGATIVE
Protein, ur: NEGATIVE mg/dL
Specific Gravity, Urine: 1.024 (ref 1.005–1.030)
pH: 6 (ref 5.0–8.0)

## 2021-01-30 LAB — COMPREHENSIVE METABOLIC PANEL
ALT: 18 U/L (ref 0–44)
AST: 26 U/L (ref 15–41)
Albumin: 3.6 g/dL (ref 3.5–5.0)
Alkaline Phosphatase: 125 U/L (ref 38–126)
Anion gap: 8 (ref 5–15)
BUN: 18 mg/dL (ref 8–23)
CO2: 27 mmol/L (ref 22–32)
Calcium: 9.2 mg/dL (ref 8.9–10.3)
Chloride: 101 mmol/L (ref 98–111)
Creatinine, Ser: 0.87 mg/dL (ref 0.44–1.00)
GFR, Estimated: >60 mL/min (ref >60)
Glucose, Bld: 407 mg/dL — ABNORMAL HIGH (ref 70–99)
Potassium: 5.3 mmol/L — ABNORMAL HIGH (ref 3.5–5.1)
Sodium: 136 mmol/L (ref 135–145)
Total Bilirubin: 0.8 mg/dL (ref 0.3–1.2)
Total Protein: 8.3 g/dL — ABNORMAL HIGH (ref 6.5–8.1)

## 2021-01-30 LAB — DIFFERENTIAL
Abs Immature Granulocytes: 0.02 10*3/uL (ref 0.00–0.07)
Basophils Absolute: 0 10*3/uL (ref 0.0–0.1)
Basophils Relative: 0 %
Eosinophils Absolute: 0.1 10*3/uL (ref 0.0–0.5)
Eosinophils Relative: 1 %
Immature Granulocytes: 0 %
Lymphocytes Relative: 26 %
Lymphs Abs: 2.4 10*3/uL (ref 0.7–4.0)
Monocytes Absolute: 0.6 10*3/uL (ref 0.1–1.0)
Monocytes Relative: 7 %
Neutro Abs: 5.9 10*3/uL (ref 1.7–7.7)
Neutrophils Relative %: 66 %

## 2021-01-30 LAB — PROTIME-INR
INR: 2.4 — ABNORMAL HIGH (ref 0.8–1.2)
Prothrombin Time: 25.7 seconds — ABNORMAL HIGH (ref 11.4–15.2)

## 2021-01-30 LAB — RESP PANEL BY RT-PCR (FLU A&B, COVID) ARPGX2
Influenza A by PCR: NEGATIVE
Influenza B by PCR: NEGATIVE
SARS Coronavirus 2 by RT PCR: NEGATIVE

## 2021-01-30 LAB — ETHANOL: Alcohol, Ethyl (B): <10 mg/dL (ref <10)

## 2021-01-30 LAB — CBC
HCT: 41.8 % (ref 36.0–46.0)
Hemoglobin: 13.2 g/dL (ref 12.0–15.0)
MCH: 26.3 pg (ref 26.0–34.0)
MCHC: 31.6 g/dL (ref 30.0–36.0)
MCV: 83.3 fL (ref 80.0–100.0)
Platelets: 234 10*3/uL (ref 150–400)
RBC: 5.02 MIL/uL (ref 3.87–5.11)
RDW: 16.4 % — ABNORMAL HIGH (ref 11.5–15.5)
WBC: 9.1 10*3/uL (ref 4.0–10.5)
nRBC: 0 % (ref 0.0–0.2)

## 2021-01-30 LAB — CBG MONITORING, ED
Glucose-Capillary: 360 mg/dL — ABNORMAL HIGH (ref 70–99)
Glucose-Capillary: 337 mg/dL — ABNORMAL HIGH (ref 70–99)
Glucose-Capillary: 100 mg/dL — ABNORMAL HIGH (ref 70–99)

## 2021-01-30 LAB — APTT: aPTT: 29 seconds (ref 24–36)

## 2021-01-30 MED ORDER — INSULIN ASPART 100 UNIT/ML IJ SOLN
10.0000 [IU] | Freq: Once | INTRAMUSCULAR | Status: AC
  Administered 2021-01-30: 10 [IU] via INTRAVENOUS
  Filled 2021-01-30: qty 0.1

## 2021-01-30 NOTE — ED Provider Notes (Signed)
[facility] DEPT Provider Note   CSN: [id number] Arrival date & time: 01/30/21  1612     History Chief Complaint  Patient presents with   Hyperglycemia    Crystal Moore is a 75 y.o. female.  HPI Patient with history of prior stroke, memory loss presents from home with concern for headache, hyperglycemia.  Level 5 caveat secondary to dementia The patient cannot provide details beyond her current situation, but does recall a headache that occurred last night, resolved without intervention.  Currently she denies pain, new weakness. Per report patient had hyperglycemia earlier in the day, with a glucose greater than 500 and was sent here for evaluation.    Past Medical History:  Diagnosis Date   Arthritis    COPD (chronic obstructive pulmonary disease) ([place])    COVID-19 virus infection 07/2020   CVA (cerebral vascular accident) ([place])    Diabetes mellitus without complication ([place])    Hemiparesis affecting left side as late effect of cerebrovascular accident (CVA) ([place])    Pyelonephritis    Seizures ([place])    Stroke ([place])    Tobacco use disorder    UTI (urinary tract infection)     Patient Active Problem List   Diagnosis Date Noted   Diabetic oculopathy ([place]) 11/14/2020   Hemiplegia of nondominant side as late effect of cerebrovascular disease ([place]) 11/14/2020   Hyperglycemia due to type 2 diabetes mellitus ([place]) 11/14/2020   Long term (current) use of insulin ([place]) 11/14/2020   Memory loss 11/14/2020   Nicotine dependence, unspecified, uncomplicated 76/54/6503   Non-pressure chronic ulcer of other part of left foot with unspecified severity ([place]) 11/14/2020   Residual cognitive deficit as late effect of cerebrovascular accident 11/14/2020   Pressure injury of skin 09/16/2020   Acute blood loss anemia    Gastric AVM    Acute upper GI bleed 09/14/2020   Symptomatic anemia 09/14/2020   Peripheral arterial disease with history of  revascularization ([place]) 09/14/2020   Cellulitis of great toe of right foot 08/26/2020   CVA (cerebral vascular accident) ([place])    COVID-19 virus infection 07/2020   Ankle ulcer ([place]) 06/28/2020   Closed fracture of neck of left femur ([place]) 08/11/2019   COVID-19 virus detected 08/11/2019   Dementia ([place]) 08/11/2019   AKI (acute kidney injury) ([place]) 01/15/2019   Cerebral thrombosis with cerebral infarction 01/15/2019   TIA (transient ischemic attack) 01/13/2019   Type 2 diabetes mellitus with vascular disease ([place]) 01/13/2019   Hypokalemia 12/18/2018   Acute CVA (cerebrovascular accident) ([place]) 12/17/2018   Malnutrition of moderate degree 05/31/2018   Hematochezia 05/29/2018   Ischemic colitis ([place]) 05/29/2018   Acute encephalopathy 05/29/2018   Type 2 diabetes mellitus ([place]) 05/29/2018   Hypertension 05/29/2018   Depression 05/29/2018   COPD (chronic obstructive pulmonary disease) ([place]) 05/29/2018   Sepsis ([place]) 05/28/2018   Diabetes ([place]) 03/16/2018   ILD (interstitial lung disease) ([place]) 02/18/2018   Chronic respiratory failure with hypoxia ([place]) 02/18/2018   COPD (chronic obstructive pulmonary disease) ([place]) 01/12/2018   Tobacco abuse 01/12/2018   Arthritis 07/29/2017   Asthma 07/29/2017   Gastroesophageal reflux disease 07/29/2017   Hyperlipidemia 07/29/2017   Left hemiplegia ([place]) 07/29/2017   Sciatica 07/29/2017   Vitamin D deficiency 07/29/2017    Past Surgical History:  Procedure Laterality Date   ABDOMINAL AORTOGRAM W/LOWER EXTREMITY N/A 08/30/2020   Procedure: ABDOMINAL AORTOGRAM W/LOWER EXTREMITY;  Surgeon: Cherre Robins, MD;  Location: [facility];  Service: Cardiovascular;  Laterality: N/A;   ABDOMINAL AORTOGRAM W/LOWER EXTREMITY N/A 09/04/2020   Procedure: ABDOMINAL AORTOGRAM W/LOWER EXTREMITY;  Surgeon: Cherre Robins, MD;  Location: [facility];  Service: Cardiovascular;  Laterality: N/A;   ESOPHAGOGASTRODUODENOSCOPY (EGD) WITH PROPOFOL N/A  09/16/2020   Procedure: ESOPHAGOGASTRODUODENOSCOPY (EGD) WITH PROPOFOL;  Surgeon: Lavena Bullion, DO;  Location: [facility];  Service: Gastroenterology;  Laterality: N/A;   HEMOSTASIS CLIP PLACEMENT  09/16/2020   Procedure: HEMOSTASIS CLIP PLACEMENT;  Surgeon: Lavena Bullion, DO;  Location: [facility];  Service: Gastroenterology;;   HOT HEMOSTASIS N/A 09/16/2020   Procedure: HOT HEMOSTASIS (ARGON PLASMA COAGULATION/BICAP);  Surgeon: Lavena Bullion, DO;  Location: [facility];  Service: Gastroenterology;  Laterality: N/A;   PERIPHERAL VASCULAR INTERVENTION Right 08/30/2020   Procedure: PERIPHERAL VASCULAR INTERVENTION;  Surgeon: Cherre Robins, MD;  Location: [facility];  Service: Cardiovascular;  Laterality: Right;  femoral popliteal   PERIPHERAL VASCULAR INTERVENTION Left 09/04/2020   Procedure: PERIPHERAL VASCULAR INTERVENTION;  Surgeon: Cherre Robins, MD;  Location: [facility];  Service: Cardiovascular;  Laterality: Left;  SFA   TUBAL LIGATION       OB History   No obstetric history on file.     Family History  Problem Relation Age of Onset   Diabetes Mother    Diabetes Sister    Diabetes Brother     Social History   Tobacco Use   Smoking status: Every Day    Packs/day: 1.00    Years: 40.00    Pack years: 40.00    Types: Cigarettes   Smokeless tobacco: Never  Vaping Use   Vaping Use: Never used  Substance Use Topics   Alcohol use: Never   Drug use: Never    Home Medications Prior to Admission medications   Medication Sig Start Date End Date Taking? Authorizing Provider  acetaminophen (TYLENOL) 500 MG tablet Take 1,000 mg by mouth every 6 (six) hours as needed (pain).    [provider]  albuterol (PROVENTIL) (2.5 MG/3ML) 0.083% nebulizer solution Inhale 3 mLs (2.5 mg total) into the lungs every 4 (four) hours as needed for wheezing or shortness of breath. 01/17/19   Eulogio Bear U, DO  albuterol (VENTOLIN HFA) 108 (90 Base)  MCG/ACT inhaler Inhale 1 puff into the lungs every 6 (six) hours as needed for wheezing or shortness of breath.    [provider]  aspirin 81 MG EC tablet Take 81 mg by mouth daily. 11/07/15   [provider]  atorvastatin (LIPITOR) 80 MG tablet Take 1 tablet (80 mg total) by mouth daily at 6 PM. Patient taking differently: Take 80 mg by mouth daily. 12/21/18   Cristal Ford, DO  clopidogrel (PLAVIX) 75 MG tablet Take 1 tablet (75 mg total) by mouth daily. 09/24/20   Cherre Robins, MD  feeding supplement (ENSURE ENLIVE / ENSURE PLUS) LIQD Take 237 mLs by mouth 2 (two) times daily between meals. 06/28/20   Renato Shin, MD  gabapentin (NEURONTIN) 300 MG capsule Take 1 capsule (300 mg total) by mouth 3 (three) times daily. 09/06/20   Hosie Poisson, MD  glucose blood (ACCU-CHEK GUIDE) test strip 1 each by Other route in the morning and at bedtime. And lancets 2/day 05/21/20   Renato Shin, MD  insulin glargine (LANTUS) 100 UNIT/ML injection Inject 0.15 mLs (15 Units total) into the skin daily. 09/18/20   Terrilee Croak, MD  linagliptin (TRADJENTA) 5 MG TABS tablet Take 5 mg by mouth daily.  [provider]  pantoprazole (PROTONIX) 40 MG tablet Take 1 tablet (40 mg total) by mouth 2 (two) times daily. 09/18/20 10/30/20  Terrilee Croak, MD    Allergies    Patient has no known allergies.  Review of Systems   Review of Systems  Unable to perform ROS: Dementia   Physical Exam Updated Vital Signs BP (!) 141/97   Pulse 84   Temp 97.9 F (36.6 C)   Resp (!) 27   Ht 5\' 2"  (1.575 m)   Wt 60 kg   SpO2 95%   BMI 24.19 kg/m   Physical Exam Vitals and nursing note reviewed.  Constitutional:      General: She is not in acute distress.    Appearance: She is well-developed. She is ill-appearing. She is not toxic-appearing.  HENT:     Head: Normocephalic and atraumatic.  Eyes:     Conjunctiva/sclera: Conjunctivae normal.  Cardiovascular:     Rate and Rhythm: Normal  rate and regular rhythm.  Pulmonary:     Effort: Pulmonary effort is normal. No respiratory distress.     Breath sounds: Normal breath sounds. No stridor.  Abdominal:     General: There is no distension.  Skin:    General: Skin is warm and dry.     Comments: Left lateral foot chronic wound no surrounding erythema  Neurological:     Mental Status: She is alert.     Comments: Speech is quiet, brief, but appropriate with sustained answers.  Patient has left-sided hemiparesis, denies changes.  She follows commands moving her right upper and lower extremities appropriately.  No gross facial asymmetry.    ED Results / Procedures / Treatments   Labs (all labs ordered are listed, but only abnormal results are displayed) Labs Reviewed  CBC - Abnormal; Notable for the following components:      Result Value   RDW 16.4 (*)    All other components within normal limits  COMPREHENSIVE METABOLIC PANEL - Abnormal; Notable for the following components:   Potassium 5.3 (*)    Glucose, Bld 407 (*)    Total Protein 8.3 (*)    All other components within normal limits  URINALYSIS, ROUTINE W REFLEX MICROSCOPIC - Abnormal; Notable for the following components:   Color, Urine STRAW (*)    Glucose, UA >=500 (*)    Bacteria, UA RARE (*)    All other components within normal limits  PROTIME-INR - Abnormal; Notable for the following components:   Prothrombin Time 25.7 (*)    INR 2.4 (*)    All other components within normal limits  CBG MONITORING, ED - Abnormal; Notable for the following components:   Glucose-Capillary 360 (*)    All other components within normal limits  CBG MONITORING, ED - Abnormal; Notable for the following components:   Glucose-Capillary 337 (*)    All other components within normal limits  CBG MONITORING, ED - Abnormal; Notable for the following components:   Glucose-Capillary 100 (*)    All other components within normal limits  RESP PANEL BY RT-PCR (FLU A&B, COVID) [id number]   ETHANOL  DIFFERENTIAL  APTT    EKG EKG Interpretation  Date/Time:  Thursday January 30 2021 16:54:47 EDT Ventricular Rate:  84 PR Interval:  142 QRS Duration: 90 QT Interval:  342 QTC Calculation: 404 R Axis:   74 Text Interpretation: Normal sinus rhythm Artifact Baseline wander Abnormal ECG Confirmed by Carmin Muskrat [id number]) on 01/30/2021 5:04:26 PM  Radiology CT HEAD WO  CONTRAST  Result Date: 01/30/2021 CLINICAL DATA:  Mental status change.  Dementia.  CVA.  On Plavix. EXAM: CT HEAD WITHOUT CONTRAST TECHNIQUE: Contiguous axial images were obtained from the base of the skull through the vertex without intravenous contrast. COMPARISON:  CT head 01/14/2019 FINDINGS: Brain: Cerebral ventricle sizes are concordant with the degree of cerebral volume loss. Patchy and confluent areas of decreased attenuation are noted throughout the deep and periventricular white matter of the cerebral hemispheres bilaterally, compatible with chronic microvascular ischemic disease. Redemonstration of a chronic right anterior cerebral artery territory infarction. No evidence of large-territorial acute infarction. No parenchymal hemorrhage. No mass lesion. No extra-axial collection. No mass effect or midline shift. No hydrocephalus. Basilar cisterns are patent. Vascular: No hyperdense vessel. Atherosclerotic calcifications are present within the cavernous internal carotid arteries. Skull: No acute fracture or focal lesion. Sinuses/Orbits: Paranasal sinuses and mastoid air cells are clear. The orbits are unremarkable. Other: None. IMPRESSION: No acute intracranial abnormality. Electronically Signed   By: Iven Finn M.D.   On: 01/30/2021 17:48    Procedures Procedures   Medications Ordered in ED Medications  insulin aspart (novoLOG) injection 10 Units (10 Units Intravenous Given 01/30/21 1901)    ED Course  I have reviewed the triage vital signs and the nursing notes.  Pertinent labs & imaging results that  were available during my care of the patient were reviewed by me and considered in my medical decision making (see chart for details).  Point-of-care glucose performed soon after arrival to the ED 260.  8:48 PM Glucose now 100.  She is sitting upright, awake, alert, smiling.  She is accompanied by her daughter.  We discussed all findings, CT scans, labs.  With unremarkable head CT, no new neurodeficits, low suspicion for stroke and the patient is already on maximal medical management.  No evidence for DKA. No lab or physical exam findings consistent with obvious infection, no evidence for bacteremia, sepsis. Given her absence of headache, sitting upright status, absence of complaints, reassuring labs, CT, x-ray, patient discharged in stable condition to follow-up with her physician for consideration of medication adjustment for better glucose control.  MDM Rules/Calculators/A&P MDM Number of Diagnoses or Management Options Bad headache: new, needed workup Hyperglycemia: new, needed workup   Amount and/or Complexity of Data Reviewed Clinical lab tests: ordered and reviewed Tests in the radiology section of CPT: ordered and reviewed Tests in the medicine section of CPT: ordered and reviewed Decide to obtain previous medical records or to obtain history from someone other than the patient: yes Obtain history from someone other than the patient: yes Review and summarize past medical records: yes Independent visualization of images, tracings, or specimens: yes  Risk of Complications, Morbidity, and/or Mortality Presenting problems: high Diagnostic procedures: high Management options: high  Critical Care Total time providing critical care: < 30 minutes  Patient Progress Patient progress: stable   Final Clinical Impression(s) / ED Diagnoses Final diagnoses:  Hyperglycemia  Bad headache    Rx / DC Orders ED Discharge Orders     None        Carmin Muskrat, MD 01/30/21  2052

## 2021-01-30 NOTE — ED Triage Notes (Signed)
BIB EMS from home with [id number]. Hx of dementia.

## 2021-01-30 NOTE — Discharge Instructions (Addendum)
As discussed, your evaluation today has been largely reassuring.  But, it is important that you monitor your condition carefully, and do not hesitate to return to the ED if you develop new, or concerning changes in your condition.  Otherwise, please follow-up with your physician for appropriate ongoing care.  In particular, please discuss your medication regimen for improved glucose control.

## 2021-02-04 DIAGNOSIS — E11622 Type 2 diabetes mellitus with other skin ulcer: Secondary | ICD-10-CM | POA: Diagnosis not present

## 2021-02-04 DIAGNOSIS — R2681 Unsteadiness on feet: Secondary | ICD-10-CM | POA: Diagnosis not present

## 2021-02-04 DIAGNOSIS — I739 Peripheral vascular disease, unspecified: Secondary | ICD-10-CM | POA: Diagnosis not present

## 2021-02-04 DIAGNOSIS — L97328 Non-pressure chronic ulcer of left ankle with other specified severity: Secondary | ICD-10-CM | POA: Diagnosis not present

## 2021-02-04 DIAGNOSIS — E1151 Type 2 diabetes mellitus with diabetic peripheral angiopathy without gangrene: Secondary | ICD-10-CM | POA: Diagnosis not present

## 2021-02-04 DIAGNOSIS — J9611 Chronic respiratory failure with hypoxia: Secondary | ICD-10-CM | POA: Diagnosis not present

## 2021-02-04 DIAGNOSIS — J449 Chronic obstructive pulmonary disease, unspecified: Secondary | ICD-10-CM | POA: Diagnosis not present

## 2021-02-04 DIAGNOSIS — Z794 Long term (current) use of insulin: Secondary | ICD-10-CM | POA: Diagnosis not present

## 2021-02-04 DIAGNOSIS — R531 Weakness: Secondary | ICD-10-CM | POA: Diagnosis not present

## 2021-02-05 DIAGNOSIS — L97328 Non-pressure chronic ulcer of left ankle with other specified severity: Secondary | ICD-10-CM | POA: Diagnosis not present

## 2021-02-06 ENCOUNTER — Encounter (HOSPITAL_BASED_OUTPATIENT_CLINIC_OR_DEPARTMENT_OTHER): Payer: Medicare Other | Admitting: Internal Medicine

## 2021-02-07 DIAGNOSIS — I739 Peripheral vascular disease, unspecified: Secondary | ICD-10-CM | POA: Diagnosis not present

## 2021-02-07 DIAGNOSIS — E11622 Type 2 diabetes mellitus with other skin ulcer: Secondary | ICD-10-CM | POA: Diagnosis not present

## 2021-02-07 DIAGNOSIS — J9611 Chronic respiratory failure with hypoxia: Secondary | ICD-10-CM | POA: Diagnosis not present

## 2021-02-07 DIAGNOSIS — E1151 Type 2 diabetes mellitus with diabetic peripheral angiopathy without gangrene: Secondary | ICD-10-CM | POA: Diagnosis not present

## 2021-02-07 DIAGNOSIS — J449 Chronic obstructive pulmonary disease, unspecified: Secondary | ICD-10-CM | POA: Diagnosis not present

## 2021-02-07 DIAGNOSIS — R2681 Unsteadiness on feet: Secondary | ICD-10-CM | POA: Diagnosis not present

## 2021-02-07 DIAGNOSIS — Z794 Long term (current) use of insulin: Secondary | ICD-10-CM | POA: Diagnosis not present

## 2021-02-07 DIAGNOSIS — R531 Weakness: Secondary | ICD-10-CM | POA: Diagnosis not present

## 2021-02-07 DIAGNOSIS — L97328 Non-pressure chronic ulcer of left ankle with other specified severity: Secondary | ICD-10-CM | POA: Diagnosis not present

## 2021-02-11 DIAGNOSIS — R531 Weakness: Secondary | ICD-10-CM | POA: Diagnosis not present

## 2021-02-11 DIAGNOSIS — I739 Peripheral vascular disease, unspecified: Secondary | ICD-10-CM | POA: Diagnosis not present

## 2021-02-11 DIAGNOSIS — E11622 Type 2 diabetes mellitus with other skin ulcer: Secondary | ICD-10-CM | POA: Diagnosis not present

## 2021-02-11 DIAGNOSIS — R2681 Unsteadiness on feet: Secondary | ICD-10-CM | POA: Diagnosis not present

## 2021-02-11 DIAGNOSIS — Z794 Long term (current) use of insulin: Secondary | ICD-10-CM | POA: Diagnosis not present

## 2021-02-11 DIAGNOSIS — L97328 Non-pressure chronic ulcer of left ankle with other specified severity: Secondary | ICD-10-CM | POA: Diagnosis not present

## 2021-02-11 DIAGNOSIS — J9611 Chronic respiratory failure with hypoxia: Secondary | ICD-10-CM | POA: Diagnosis not present

## 2021-02-11 DIAGNOSIS — J449 Chronic obstructive pulmonary disease, unspecified: Secondary | ICD-10-CM | POA: Diagnosis not present

## 2021-02-11 DIAGNOSIS — E1151 Type 2 diabetes mellitus with diabetic peripheral angiopathy without gangrene: Secondary | ICD-10-CM | POA: Diagnosis not present

## 2021-02-13 DIAGNOSIS — R2681 Unsteadiness on feet: Secondary | ICD-10-CM | POA: Diagnosis not present

## 2021-02-13 DIAGNOSIS — R531 Weakness: Secondary | ICD-10-CM | POA: Diagnosis not present

## 2021-02-13 DIAGNOSIS — E1151 Type 2 diabetes mellitus with diabetic peripheral angiopathy without gangrene: Secondary | ICD-10-CM | POA: Diagnosis not present

## 2021-02-13 DIAGNOSIS — L97328 Non-pressure chronic ulcer of left ankle with other specified severity: Secondary | ICD-10-CM | POA: Diagnosis not present

## 2021-02-13 DIAGNOSIS — J9611 Chronic respiratory failure with hypoxia: Secondary | ICD-10-CM | POA: Diagnosis not present

## 2021-02-13 DIAGNOSIS — Z794 Long term (current) use of insulin: Secondary | ICD-10-CM | POA: Diagnosis not present

## 2021-02-13 DIAGNOSIS — J449 Chronic obstructive pulmonary disease, unspecified: Secondary | ICD-10-CM | POA: Diagnosis not present

## 2021-02-13 DIAGNOSIS — E11622 Type 2 diabetes mellitus with other skin ulcer: Secondary | ICD-10-CM | POA: Diagnosis not present

## 2021-02-13 DIAGNOSIS — I739 Peripheral vascular disease, unspecified: Secondary | ICD-10-CM | POA: Diagnosis not present

## 2021-02-18 DIAGNOSIS — J449 Chronic obstructive pulmonary disease, unspecified: Secondary | ICD-10-CM | POA: Diagnosis not present

## 2021-02-18 DIAGNOSIS — R531 Weakness: Secondary | ICD-10-CM | POA: Diagnosis not present

## 2021-02-18 DIAGNOSIS — E1151 Type 2 diabetes mellitus with diabetic peripheral angiopathy without gangrene: Secondary | ICD-10-CM | POA: Diagnosis not present

## 2021-02-18 DIAGNOSIS — J9611 Chronic respiratory failure with hypoxia: Secondary | ICD-10-CM | POA: Diagnosis not present

## 2021-02-18 DIAGNOSIS — L97328 Non-pressure chronic ulcer of left ankle with other specified severity: Secondary | ICD-10-CM | POA: Diagnosis not present

## 2021-02-18 DIAGNOSIS — E11622 Type 2 diabetes mellitus with other skin ulcer: Secondary | ICD-10-CM | POA: Diagnosis not present

## 2021-02-18 DIAGNOSIS — I739 Peripheral vascular disease, unspecified: Secondary | ICD-10-CM | POA: Diagnosis not present

## 2021-02-18 DIAGNOSIS — Z794 Long term (current) use of insulin: Secondary | ICD-10-CM | POA: Diagnosis not present

## 2021-02-18 DIAGNOSIS — R2681 Unsteadiness on feet: Secondary | ICD-10-CM | POA: Diagnosis not present

## 2021-02-19 DIAGNOSIS — L97328 Non-pressure chronic ulcer of left ankle with other specified severity: Secondary | ICD-10-CM | POA: Diagnosis not present

## 2021-02-21 DIAGNOSIS — I739 Peripheral vascular disease, unspecified: Secondary | ICD-10-CM | POA: Diagnosis not present

## 2021-02-21 DIAGNOSIS — E11622 Type 2 diabetes mellitus with other skin ulcer: Secondary | ICD-10-CM | POA: Diagnosis not present

## 2021-02-21 DIAGNOSIS — E1151 Type 2 diabetes mellitus with diabetic peripheral angiopathy without gangrene: Secondary | ICD-10-CM | POA: Diagnosis not present

## 2021-02-21 DIAGNOSIS — J449 Chronic obstructive pulmonary disease, unspecified: Secondary | ICD-10-CM | POA: Diagnosis not present

## 2021-02-21 DIAGNOSIS — L97328 Non-pressure chronic ulcer of left ankle with other specified severity: Secondary | ICD-10-CM | POA: Diagnosis not present

## 2021-02-21 DIAGNOSIS — R531 Weakness: Secondary | ICD-10-CM | POA: Diagnosis not present

## 2021-02-21 DIAGNOSIS — J9611 Chronic respiratory failure with hypoxia: Secondary | ICD-10-CM | POA: Diagnosis not present

## 2021-02-21 DIAGNOSIS — R2681 Unsteadiness on feet: Secondary | ICD-10-CM | POA: Diagnosis not present

## 2021-02-21 DIAGNOSIS — Z794 Long term (current) use of insulin: Secondary | ICD-10-CM | POA: Diagnosis not present

## 2021-02-24 DIAGNOSIS — R531 Weakness: Secondary | ICD-10-CM | POA: Diagnosis not present

## 2021-02-25 ENCOUNTER — Encounter (HOSPITAL_BASED_OUTPATIENT_CLINIC_OR_DEPARTMENT_OTHER): Payer: Medicare Other | Attending: Internal Medicine | Admitting: Internal Medicine

## 2021-02-25 ENCOUNTER — Other Ambulatory Visit: Payer: Self-pay

## 2021-02-25 DIAGNOSIS — I69354 Hemiplegia and hemiparesis following cerebral infarction affecting left non-dominant side: Secondary | ICD-10-CM | POA: Diagnosis not present

## 2021-02-25 DIAGNOSIS — J449 Chronic obstructive pulmonary disease, unspecified: Secondary | ICD-10-CM | POA: Insufficient documentation

## 2021-02-25 DIAGNOSIS — I10 Essential (primary) hypertension: Secondary | ICD-10-CM | POA: Diagnosis not present

## 2021-02-25 DIAGNOSIS — G40909 Epilepsy, unspecified, not intractable, without status epilepticus: Secondary | ICD-10-CM | POA: Insufficient documentation

## 2021-02-25 DIAGNOSIS — E1136 Type 2 diabetes mellitus with diabetic cataract: Secondary | ICD-10-CM | POA: Insufficient documentation

## 2021-02-25 DIAGNOSIS — S91002A Unspecified open wound, left ankle, initial encounter: Secondary | ICD-10-CM | POA: Diagnosis not present

## 2021-02-25 DIAGNOSIS — E11621 Type 2 diabetes mellitus with foot ulcer: Secondary | ICD-10-CM | POA: Diagnosis not present

## 2021-02-25 DIAGNOSIS — Z87891 Personal history of nicotine dependence: Secondary | ICD-10-CM | POA: Insufficient documentation

## 2021-02-25 DIAGNOSIS — E1151 Type 2 diabetes mellitus with diabetic peripheral angiopathy without gangrene: Secondary | ICD-10-CM | POA: Diagnosis not present

## 2021-02-25 DIAGNOSIS — F0150 Vascular dementia without behavioral disturbance: Secondary | ICD-10-CM | POA: Insufficient documentation

## 2021-02-25 DIAGNOSIS — I739 Peripheral vascular disease, unspecified: Secondary | ICD-10-CM | POA: Diagnosis not present

## 2021-02-25 DIAGNOSIS — X58XXXA Exposure to other specified factors, initial encounter: Secondary | ICD-10-CM | POA: Diagnosis not present

## 2021-02-25 NOTE — Progress Notes (Signed)
Crystal Moore, Crystal Moore ([id number]) Visit Report for 02/25/2021 Abuse/Suicide Risk Screen Details Patient Name: Date of Service: Crystal Moore, Crystal Moore 02/25/2021 2:45 PM Medical Record Number: [id number] Patient Account Number: [id number] Date of Birth/Sex: Treating RN: [birth year]Crystal Moore (75 y.o. [name] Primary Care [name]: [name] Other Clinician: Referring [name]: Treating [name]/Extender: [name] Weeks in Treatment: 0 Abuse/Suicide Risk Screen Items Answer ABUSE RISK SCREEN: Has anyone close to you tried to hurt or harm you recentlyo No Do you feel uncomfortable with anyone in your familyo No Has anyone forced you do things that you didnt want to doo No Electronic Signature(s) Signed: 02/25/2021 5:31:21 PM By: Baruch Gouty RN, BSN Entered By: Baruch Gouty on Moore/08/2020 15:47:34 -------------------------------------------------------------------------------- Activities of Daily Living Details Patient Name: Date of Service: Crystal Moore, Crystal Moore 02/25/2021 2:45 PM Medical Record Number: [id number] Patient Account Number: [id number] Date of Birth/Sex: Treating RN: [birth year]/Crystal Moore (75 y.o. [name] Primary Care [name]: [name] Other Clinician: Referring [name]: Treating [name]/Extender: [name] Weeks in Treatment: 0 Activities of Daily Living Items Answer Activities of Daily Living (Please select one for each item) Drive Automobile Not Able T Medications ake Need Assistance Use T elephone Need Assistance Care for Appearance Need Assistance Use T oilet Need Assistance Bath / Shower Need Assistance Dress Self Need Assistance Feed Self Completely Able Walk Not Able Get In / Out Bed Need Assistance Housework Not Able Prepare Meals Not Able Handle Money Not Able Shop for Self Not Able Electronic Signature(s) Signed: 02/25/2021 5:31:21 PM By: Baruch Gouty RN, BSN Entered By:  Baruch Gouty on Moore/08/2020 15:50:52 -------------------------------------------------------------------------------- Education Screening Details Patient Name: Date of Service: Crystal Moore. 02/25/2021 2:45 PM Medical Record Number: [id number] Patient Account Number: [id number] Date of Birth/Sex: Treating RN: [birth year] (75 y.o. [name] Primary Care [name]: [name] Other Clinician: Referring [name]: Treating [name]/Extender: [name] in Treatment: 0 Primary Learner Assessed: Patient Learning Preferences/Education Level/Primary Language Learning Preference: Explanation, Demonstration Highest Education Level: High School Preferred Language: English Cognitive Barrier Language Barrier: No Translator Needed: No Memory Deficit: No Emotional Barrier: No Cultural/Religious Beliefs Affecting Medical Care: No Physical Barrier Impaired Vision: No Impaired Hearing: No Decreased Hand dexterity: Yes Limitations: left hand weakness Knowledge/Comprehension Knowledge Level: Medium Comprehension Level: Medium Ability to understand written instructions: Medium Ability to understand verbal instructions: Medium Motivation Anxiety Level: Calm Cooperation: Cooperative Education Importance: Acknowledges Need Interest in Health Problems: Asks Questions Perception: Coherent Willingness to Engage in Self-Management High Activities: Readiness to Engage in Self-Management High Activities: Electronic Signature(s) Signed: 02/25/2021 5:31:21 PM By: Baruch Gouty RN, BSN Entered By: Baruch Gouty on Moore/08/2020 15:51:40 -------------------------------------------------------------------------------- Fall Risk Assessment Details Patient Name: Date of Service: Crystal Moore. 02/25/2021 2:45 PM Medical Record Number: [id number] Patient Account Number: [id number] Date of Birth/Sex: Treating RN: Crystal Moore[birth year] (75 y.o. [name] Primary Care [name]: [name] Other Clinician: Referring [name]: Treating [name]/Extender: [name] Weeks in Treatment: 0 Fall Risk Assessment Items Have you had 2 or more falls in the last 12 monthso 0 No Have you had any fall that resulted in injury in the last 12 monthso 0 No FALLS RISK SCREEN History of falling - immediate or within 3 months 0 No Secondary diagnosis (Do you have 2 or more medical diagnoseso) 15 Yes Ambulatory aid None/bed rest/wheelchair/nurse 0 Yes Crutches/cane/walker 0 No Furniture 0 No Intravenous therapy Access/Saline/Heparin Lock 0 No Gait/Transferring Normal/ bed rest/ wheelchair 0 No Weak (short  steps with or without shuffle, stooped but able to lift head while walking, may seek 0 No support from furniture) Impaired (short steps with shuffle, may have difficulty arising from chair, head down, impaired 20 Yes balance) Mental Status Oriented to own ability 0 Yes Electronic Signature(s) Signed: 02/25/2021 5:31:21 PM By: Baruch Gouty RN, BSN Entered By: Baruch Gouty on Moore/08/2020 15:52:02 -------------------------------------------------------------------------------- Foot Assessment Details Patient Name: Date of Service: Crystal Moore. 02/25/2021 2:45 PM Medical Record Number: [id number] Patient Account Number: [id number] Date of Birth/Sex: Treating RN: Crystal Moore, Crystal Moore (75 y.o. [name], Crystal Moore Primary Care Crystal Moore: [name] Other Clinician: Referring Natajah Derderian: Treating [name]/Extender: [name] Weeks in Treatment: 0 Foot Assessment Items [x]  Unable to perform due to altered mental status Site Locations + = Sensation present, - = Sensation absent, C = Callus, U = Ulcer R = Redness, W = Warmth, M = Maceration, PU = Pre-ulcerative lesion F = Fissure, S = Swelling, D = Dryness Assessment Right: Left: Other Deformity: No No Prior  Foot Ulcer: No No Prior Amputation: No No Charcot Joint: No No Ambulatory Status: Non-ambulatory Assistance Device: Wheelchair Gait: Electronic Signature(s) Signed: 02/25/2021 5:31:21 PM By: Baruch Gouty RN, BSN Entered By: Baruch Gouty on Moore/08/2020 15:53:00 -------------------------------------------------------------------------------- Nutrition Risk Screening Details Patient Name: Date of Service: Crystal Moore. 02/25/2021 2:45 PM Medical Record Number: [id number] Patient Account Number: [id number] Date of Birth/Sex: Treating RN: Crystal Moore[birth year] (75 y.o. [name] Primary Care Naveena Eyman: [name] Other Clinician: Referring Murat Rideout: Treating Evalena Fujii/Extender: [name] Weeks in Treatment: 0 Height (in): 61 Weight (lbs): 128 Body Mass Index (BMI): 24.2 Nutrition Risk Screening Items Score Screening NUTRITION RISK SCREEN: I have an illness or condition that made me change the kind and/or amount of food I eat 0 No I eat fewer than two meals per day 0 No I eat few fruits and vegetables, or milk products 0 No I have three or more drinks of beer, liquor or wine almost every day 0 No I have tooth or mouth problems that make it hard for me to eat 0 No I don't always have enough money to buy the food I need 0 No I eat alone most of the time 0 No I take three or more different prescribed or over-the-counter drugs a day 1 Yes Without wanting to, I have lost or gained 10 pounds in the last six months 0 No I am not always physically able to shop, cook and/or feed myself 0 No Nutrition Protocols Good Risk Protocol 0 No interventions needed Moderate Risk Protocol High Risk Proctocol Risk Level: Good Risk Score: 1 Electronic Signature(s) Signed: 02/25/2021 5:31:21 PM By: Baruch Gouty RN, BSN Entered By: Baruch Gouty on Moore/08/2020 15:52:34

## 2021-02-25 NOTE — Progress Notes (Signed)
Crystal, Moore ([id number]) Visit Report for 02/25/2021 Chief Complaint Document Details Patient Name: Date of Service: Crystal Moore, Crystal Moore 02/25/2021 2:45 PM Medical Record Number: [id number] Patient Account Number: [id number] Date of Birth/Sex: Treating RN: [birth year] (75 y.o. [name], [name] Primary Care Provider: [name] Other Clinician: Referring Provider: Treating Provider/Extender: [name] Weeks in Treatment: 0 Information Obtained from: Patient Chief Complaint 08/08/2020; patient is here accompanied by her grandson for review of wounds on the left lateral malleolus as well as an area on the right upper mid tibia 02/25/2021; patient is accompanied by daughter for review of her left lateral malleolus wound Electronic Signature(s) Signed: 02/25/2021 5:30:23 PM By: Kalman Shan DO Entered By: Kalman Shan on 02/25/2021 17:19:52 -------------------------------------------------------------------------------- HPI Details Patient Name: Date of Service: Crystal Moore. 02/25/2021 2:45 PM Medical Record Number: [id number] Patient Account Number: [id number] Date of Birth/Sex: Treating RN: 02/25/1946 (75 y.o. Benjaman Lobe Primary Care Provider: [name] Other Clinician: Referring Provider: Treating Provider/Extender: [name] Weeks in Treatment: 0 History of Present Illness HPI Description: Admission 02/25/2021 [name] is a 75 year old female with a past medical history of uncontrolled insulin-dependent type 2 diabetes, CVA with left hemiparesis, vascular dementia and peripheral vascular disease that presents to the clinic for worsening left lateral malleolus wound. She was treated in our clinic earlier this year for the wound. It was well-healing at that time however she has not followed up since March. She has noticed a decline in the past 1.5 months. She has been  using silver alginate to the area. She currently denies signs of infection. She does report tenderness to the wound bed. Electronic Signature(s) Signed: 02/25/2021 5:30:23 PM By: Kalman Shan DO Entered By: Kalman Shan on 02/25/2021 17:24:26 -------------------------------------------------------------------------------- Physical Exam Details Patient Name: Date of Service: Crystal Moore 02/25/2021 2:45 PM Medical Record Number: [id number] Patient Account Number: [id number] Date of Birth/Sex: Treating RN: January 18, 1946 (75 y.o. [name], [name] Primary Care Provider: [name] Other Clinician: Referring Provider: Treating Provider/Extender: [name] Weeks in Treatment: 0 Constitutional respirations regular, non-labored and within target range for patient.Marland Kitchen Psychiatric pleasant and cooperative. Notes Left leg: T the lateral malleolus there is an open wound with granulation tissue and some nonviable tissue present. Pedal pulses difficult to palpate. o Electronic Signature(s) Signed: 02/25/2021 5:30:23 PM By: Kalman Shan DO Entered By: Kalman Shan on 02/25/2021 17:25:30 -------------------------------------------------------------------------------- Physician Orders Details Patient Name: Date of Service: Crystal Moore. 02/25/2021 2:45 PM Medical Record Number: [id number] Patient Account Number: [id number] Date of Birth/Sex: Treating RN: [birth year] (75 y.o. [name], [name] Primary Care Provider: [name] Other Clinician: Referring Provider: Treating Provider/Extender: [name] Weeks in Treatment: 0 Verbal / Phone Orders: No Diagnosis Coding ICD-10 Coding Code Description S91.002A Unspecified open wound, left ankle, initial encounter I73.9 Peripheral vascular disease, unspecified E11.621 Type 2 diabetes mellitus with foot ulcer Follow-up Appointments Return Appointment  in 1 week. Bathing/ Shower/ Hygiene May shower with protection but do not get wound dressing(s) wet. May shower and wash wound with soap and water. - may wash with soap and water with dressing change. Edema Control - Lymphedema / SCD / Other Elevate legs to the level of the heart or above for 30 minutes daily and/or when sitting, a frequency of: - throughout the day. Avoid standing for long periods of time. Off-Loading Other: - ensure no pressure to left ankle wound. wear boots to relieve pressure from heels. Home Health  New wound care orders this week; continue Home Health for wound care. May utilize formulary equivalent dressing for wound treatment orders unless otherwise specified. - Home health to change twice a week. Wound Treatment Wound #4 - Ankle Wound Laterality: Left, Lateral Cleanser: Soap and Water Every Other Day/15 Days Discharge Instructions: May shower and wash wound with dial antibacterial soap and water prior to dressing change. Cleanser: Wound Cleanser [facility]) Every Other Day/15 Days Discharge Instructions: Cleanse the wound with wound cleanser prior to applying a clean dressing using gauze sponges, not tissue or cotton balls. Prim Dressing: KerraCel Ag Gelling Fiber Dressing, 4x5 in (silver alginate) (Home Health) Every Other Day/15 Days ary Discharge Instructions: Apply silver alginate to wound bed as instructed Secondary Dressing: Woven Gauze Sponge, Non-Sterile 4x4 in [facility]) Every Other Day/15 Days Discharge Instructions: Apply over primary dressing as directed. Secondary Dressing: ABD Pad, 5x9 [facility]) Every Other Day/15 Days Discharge Instructions: Apply over primary dressing as directed. Secondary Dressing: Optifoam Non-Adhesive Dressing, 4x4 in Every Other Day/15 Days Discharge Instructions: Apply foam donut for offloading Secured With: Kerlix Roll Sterile, 4.5x3.1 (in/yd) (Home Health) Every Other Day/15 Days Discharge Instructions: Secure  with Kerlix as directed. Secured With: 24M Medipore H Soft Cloth Surgical T 4 x 2 (in/yd) (Home Health) Every Other Day/15 Days ape Discharge Instructions: Secure dressing with tape as directed. Radiology X-ray, ankle - left ankle Electronic Signature(s) Signed: 02/25/2021 5:30:23 PM By: Kalman Shan DO Entered By: Kalman Shan on 02/25/2021 17:25:50 Prescription 02/25/2021 -------------------------------------------------------------------------------- Santa Lighter. Kalman Shan DO Patient Name: Provider: March 31, 1946 [id number] Date of Birth: NPI#: [id number] Sex: DEA #: [phone] Phone #: License #: [place] Patient Address: [place], [place] [address] [place], [place] [address] [phone] Allergies No Known Drug Allergies Provider's Orders X-ray, ankle - left ankle Hand Signature: Date(s): Electronic Signature(s) Signed: 02/25/2021 5:30:23 PM By: Kalman Shan DO Entered By: Kalman Shan on 02/25/2021 17:25:50 -------------------------------------------------------------------------------- Problem List Details Patient Name: Date of Service: Crystal Moore. 02/25/2021 2:45 PM Medical Record Number: [id number] Patient Account Number: [id number] Date of Birth/Sex: Treating RN: 05-11-1946 (75 y.o. [name], [name] Primary Care Provider: [name] Other Clinician: Referring Provider: Treating Provider/Extender: [name] Weeks in Treatment: 0 Active Problems ICD-10 Encounter Code Description Active Date MDM Diagnosis S91.002A Unspecified open wound, left ankle, initial encounter 02/25/2021 No Yes I73.9 Peripheral vascular disease, unspecified 02/25/2021 No Yes E11.621 Type 2 diabetes mellitus with foot ulcer 02/25/2021 No Yes Inactive Problems Resolved Problems Electronic Signature(s) Signed: 02/25/2021 5:30:23 PM  By: Kalman Shan DO Entered By: Kalman Shan on 02/25/2021 17:18:40 -------------------------------------------------------------------------------- Progress Note Details Patient Name: Date of Service: Crystal Moore. 02/25/2021 2:45 PM Medical Record Number: [id number] Patient Account Number: [id number] Date of Birth/Sex: Treating RN: 01/19/1946 (75 y.o. [name], [name] Primary Care Provider: [name] Other Clinician: Referring Provider: Treating Provider/Extender: [name] Weeks in Treatment: 0 Subjective Chief Complaint Information obtained from Patient 08/08/2020; patient is here accompanied by her grandson for review of wounds on the left lateral malleolus as well as an area on the right upper mid tibia 02/25/2021; patient is accompanied by daughter for review of her left lateral malleolus wound History of Present Illness (HPI) Admission 02/25/2021 Ms. Katyana Trolinger is a 75 year old female with a past medical history of uncontrolled insulin-dependent type 2 diabetes, CVA with left hemiparesis, vascular dementia and peripheral vascular disease that  presents to the clinic for worsening left lateral malleolus wound. She was treated in our clinic earlier this year for the wound. It was well-healing at that time however she has not followed up since March. She has noticed a decline in the past 1.5 months. She has been using silver alginate to the area. She currently denies signs of infection. She does report tenderness to the wound bed. Patient History Unable to Obtain Patient History due to Aphasia. Information obtained from Patient. Allergies No Known Drug Allergies Family History Unknown History. Social History Former smoker - quit 2 years, Marital Status - Single, Alcohol Use - Never, Drug Use - No History, Caffeine Use - Rarely. Medical History Eyes Patient has history of Cataracts - bil removed Respiratory Patient has  history of Chronic Obstructive Pulmonary Disease (COPD) Cardiovascular Patient has history of Hypertension Endocrine Patient has history of Type II Diabetes Musculoskeletal Patient has history of Osteoarthritis Neurologic Patient has history of Seizure Disorder Psychiatric Denies history of Anorexia/bulimia, Confinement Anxiety Medical A Surgical History Notes nd Cardiovascular CVA Genitourinary Pyelonephritis Neurologic left hemiparesis, CVA Review of Systems (ROS) Constitutional Symptoms (General Health) Denies complaints or symptoms of Fatigue, Fever, Chills, Marked Weight Change. Eyes Complains or has symptoms of Glasses / Contacts. Ear/Nose/Mouth/Throat Denies complaints or symptoms of Chronic sinus problems or rhinitis. Respiratory Denies complaints or symptoms of Chronic or frequent coughs, Shortness of Breath. Cardiovascular Denies complaints or symptoms of Chest pain. Gastrointestinal Denies complaints or symptoms of Frequent diarrhea, Nausea, Vomiting. Genitourinary Denies complaints or symptoms of Frequent urination. Integumentary (Skin) Complains or has symptoms of Wounds - left ankle. Musculoskeletal Complains or has symptoms of Muscle Weakness. Neurologic Denies complaints or symptoms of Numbness/parasthesias. Psychiatric Denies complaints or symptoms of Claustrophobia, Suicidal. Objective Constitutional respirations regular, non-labored and within target range for patient.. Vitals Time Taken: 3:45 PM, Height: 61 in, Source: Stated, Weight: 128 lbs, Source: Stated, BMI: 24.2, Temperature: 97.5 F, Pulse: 101 bpm, Respiratory Rate: 18 breaths/min, Blood Pressure: 183/77 mmHg, Capillary Blood Glucose: 206 mg/dl. General Notes: glucose per daughter report yesterday Psychiatric pleasant and cooperative. General Notes: Left leg: T the lateral malleolus there is an open wound with granulation tissue and some nonviable tissue present. Pedal pulses difficult  to o palpate. Integumentary (Hair, Skin) Wound #4 status is Open. Original cause of wound was Gradually Appeared. The date acquired was: 02/25/2020. The wound is located on the Left,Lateral Ankle. The wound measures 2.3cm length x 2.2cm width x 0.8cm depth; 3.974cm^2 area and 3.179cm^3 volume. There is tendon and Fat Layer (Subcutaneous Tissue) exposed. There is no tunneling or undermining noted. There is a medium amount of serosanguineous drainage noted. The wound margin is distinct with the outline attached to the wound base. There is medium (34-66%) pink granulation within the wound bed. There is a medium (34-66%) amount of necrotic tissue within the wound bed including Adherent Slough. Assessment Active Problems ICD-10 Unspecified open wound, left ankle, initial encounter Peripheral vascular disease, unspecified Type 2 diabetes mellitus with foot ulcer Patient presents with a chronically nonhealing wound that has progressively gotten worse over the past 1 to 2 months. Patient has a history of stenting to her left superficial femoral artery and left popliteal artery. She was last seen by vein and vascular in April. I would like for her to be reevaluated to assess that she has proper blood flow for her left ankle wound to heal. Patient's daughter said she will set up an appointment. I recommended continuing silver alginate with dressing changes.  We had a long discussion about aggressively offloading the wound bed. She states she has Prevalon boots at home. It is unclear if she is using this. I recommended she start. I would also like to obtain an image of the left ankle. We will order an x-ray. No obvious signs of infection on exam. Follow-up in Next week Plan Follow-up Appointments: Return Appointment in 1 week. Bathing/ Shower/ Hygiene: May shower with protection but do not get wound dressing(s) wet. May shower and wash wound with soap and water. - may wash with soap and water with  dressing change. Edema Control - Lymphedema / SCD / Other: Elevate legs to the level of the heart or above for 30 minutes daily and/or when sitting, a frequency of: - throughout the day. Avoid standing for long periods of time. Off-Loading: Other: - ensure no pressure to left ankle wound. wear boots to relieve pressure from heels. Home Health: New wound care orders this week; continue Home Health for wound care. May utilize formulary equivalent dressing for wound treatment orders unless otherwise specified. - Home health to change twice a week. Radiology ordered were: X-ray, ankle - left ankle WOUND #4: - Ankle Wound Laterality: Left, Lateral Cleanser: Soap and Water Every Other Day/15 Days Discharge Instructions: May shower and wash wound with dial antibacterial soap and water prior to dressing change. Cleanser: Wound Cleanser [facility]) Every Other Day/15 Days Discharge Instructions: Cleanse the wound with wound cleanser prior to applying a clean dressing using gauze sponges, not tissue or cotton balls. Prim Dressing: KerraCel Ag Gelling Fiber Dressing, 4x5 in (silver alginate) (Home Health) Every Other Day/15 Days ary Discharge Instructions: Apply silver alginate to wound bed as instructed Secondary Dressing: Woven Gauze Sponge, Non-Sterile 4x4 in [facility]) Every Other Day/15 Days Discharge Instructions: Apply over primary dressing as directed. Secondary Dressing: ABD Pad, 5x9 [facility]) Every Other Day/15 Days Discharge Instructions: Apply over primary dressing as directed. Secondary Dressing: Optifoam Non-Adhesive Dressing, 4x4 in Every Other Day/15 Days Discharge Instructions: Apply foam donut for offloading Secured With: Kerlix Roll Sterile, 4.5x3.1 (in/yd) (Home Health) Every Other Day/15 Days Discharge Instructions: Secure with Kerlix as directed. Secured With: 83M Medipore H Soft Cloth Surgical T 4 x 2 (in/yd) (Home Health) Every Other Day/15 Days ape Discharge  Instructions: Secure dressing with tape as directed. 1. Silver alginate 2. Left foot x-ray 3. Follow-up with vein and vascular 4. Follow-up Next week Electronic Signature(s) Signed: 02/25/2021 5:30:23 PM By: Kalman Shan DO Entered By: Kalman Shan on 02/25/2021 17:29:28 -------------------------------------------------------------------------------- HxROS Details Patient Name: Date of Service: Crystal Moore. 02/25/2021 2:45 PM Medical Record Number: [id number] Patient Account Number: [id number] Date of Birth/Sex: Treating RN: 02-09-1946 (75 y.o. [name] Primary Care Provider: [name] Other Clinician: Referring Provider: Treating Provider/Extender: [name] Weeks in Treatment: 0 Unable to Obtain Patient History due to Aphasia Information Obtained From Patient Constitutional Symptoms (General Health) Complaints and Symptoms: Negative for: Fatigue; Fever; Chills; Marked Weight Change Eyes Complaints and Symptoms: Positive for: Glasses / Contacts Medical History: Positive for: Cataracts - bil removed Ear/Nose/Mouth/Throat Complaints and Symptoms: Negative for: Chronic sinus problems or rhinitis Respiratory Complaints and Symptoms: Negative for: Chronic or frequent coughs; Shortness of Breath Medical History: Positive for: Chronic Obstructive Pulmonary Disease (COPD) Cardiovascular Complaints and Symptoms: Negative for: Chest pain Medical History: Positive for: Hypertension Past Medical History Notes: CVA Gastrointestinal Complaints and Symptoms: Negative for: Frequent diarrhea; Nausea; Vomiting Genitourinary Complaints and Symptoms: Negative for: Frequent urination Medical  History: Past Medical History Notes: Pyelonephritis Integumentary (Skin) Complaints and Symptoms: Positive for: Wounds - left ankle Musculoskeletal Complaints and Symptoms: Positive for: Muscle Weakness Medical  History: Positive for: Osteoarthritis Neurologic Complaints and Symptoms: Negative for: Numbness/parasthesias Medical History: Positive for: Seizure Disorder Past Medical History Notes: left hemiparesis, CVA Psychiatric Complaints and Symptoms: Negative for: Claustrophobia; Suicidal Medical History: Negative for: Anorexia/bulimia; Confinement Anxiety Hematologic/Lymphatic Endocrine Medical History: Positive for: Type II Diabetes Time with diabetes: >30 yrs Treated with: Insulin, Oral agents Blood sugar tested every day: No Immunological Oncologic HBO Extended History Items Eyes: Cataracts Immunizations Pneumococcal Vaccine: Received Pneumococcal Vaccination: Yes Received Pneumococcal Vaccination On or After 60th Birthday: Yes Implantable Devices None Family and Social History Unknown History: Yes; Former smoker - quit 2 years; Marital Status - Single; Alcohol Use: Never; Drug Use: No History; Caffeine Use: Rarely; Financial Concerns: No; Food, Clothing or Shelter Needs: No; Support System Lacking: No; Transportation Concerns: No Electronic Signature(s) Signed: 02/25/2021 5:30:23 PM By: Kalman Shan DO Signed: 02/25/2021 5:31:21 PM By: Baruch Gouty RN, BSN Entered By: Baruch Gouty on 02/25/2021 15:47:27 -------------------------------------------------------------------------------- [place] Details Patient Name: Date of Service: Crystal Moore. 02/25/2021 Medical Record Number: [id number] Patient Account Number: [id number] Date of Birth/Sex: Treating RN: 03-May-1946 (75 y.o. [name], [name] Primary Care Provider: [name] Other Clinician: Referring Provider: Treating Provider/Extender: [name] Weeks in Treatment: 0 Diagnosis Coding ICD-10 Codes Code Description [phone] Unspecified open wound, left ankle, initial encounter I73.9 Peripheral vascular disease, unspecified E11.621 Type 2 diabetes mellitus  with foot ulcer Facility Procedures CPT4 Code: [id number] Description: 99214 - WOUND CARE VISIT-LEV 4 EST PT Modifier: Quantity: 1 Physician Procedures : CPT4 Code Description Modifier [id number] - WC PHYS LEVEL 3 - EST PT ICD-10 Diagnosis Description S91.002A Unspecified open wound, left ankle, initial encounter I73.9 Peripheral vascular disease, unspecified E11.621 Type 2 diabetes mellitus with  foot ulcer Quantity: 1 Electronic Signature(s) Signed: 02/25/2021 5:30:23 PM By: Kalman Shan DO Entered By: Kalman Shan on 02/25/2021 17:29:50

## 2021-02-26 DIAGNOSIS — J449 Chronic obstructive pulmonary disease, unspecified: Secondary | ICD-10-CM | POA: Diagnosis not present

## 2021-02-26 DIAGNOSIS — E11621 Type 2 diabetes mellitus with foot ulcer: Secondary | ICD-10-CM | POA: Diagnosis not present

## 2021-02-26 DIAGNOSIS — J439 Emphysema, unspecified: Secondary | ICD-10-CM | POA: Diagnosis not present

## 2021-02-26 DIAGNOSIS — E1139 Type 2 diabetes mellitus with other diabetic ophthalmic complication: Secondary | ICD-10-CM | POA: Diagnosis not present

## 2021-02-26 DIAGNOSIS — E1165 Type 2 diabetes mellitus with hyperglycemia: Secondary | ICD-10-CM | POA: Diagnosis not present

## 2021-02-26 DIAGNOSIS — I10 Essential (primary) hypertension: Secondary | ICD-10-CM | POA: Diagnosis not present

## 2021-02-26 DIAGNOSIS — D500 Iron deficiency anemia secondary to blood loss (chronic): Secondary | ICD-10-CM | POA: Diagnosis not present

## 2021-02-27 DIAGNOSIS — J9611 Chronic respiratory failure with hypoxia: Secondary | ICD-10-CM | POA: Diagnosis not present

## 2021-02-27 DIAGNOSIS — I739 Peripheral vascular disease, unspecified: Secondary | ICD-10-CM | POA: Diagnosis not present

## 2021-02-27 DIAGNOSIS — Z794 Long term (current) use of insulin: Secondary | ICD-10-CM | POA: Diagnosis not present

## 2021-02-27 DIAGNOSIS — E11622 Type 2 diabetes mellitus with other skin ulcer: Secondary | ICD-10-CM | POA: Diagnosis not present

## 2021-02-27 DIAGNOSIS — R2681 Unsteadiness on feet: Secondary | ICD-10-CM | POA: Diagnosis not present

## 2021-02-27 DIAGNOSIS — R531 Weakness: Secondary | ICD-10-CM | POA: Diagnosis not present

## 2021-02-27 DIAGNOSIS — J449 Chronic obstructive pulmonary disease, unspecified: Secondary | ICD-10-CM | POA: Diagnosis not present

## 2021-02-27 DIAGNOSIS — E1151 Type 2 diabetes mellitus with diabetic peripheral angiopathy without gangrene: Secondary | ICD-10-CM | POA: Diagnosis not present

## 2021-02-27 DIAGNOSIS — L97328 Non-pressure chronic ulcer of left ankle with other specified severity: Secondary | ICD-10-CM | POA: Diagnosis not present

## 2021-02-28 DIAGNOSIS — L97328 Non-pressure chronic ulcer of left ankle with other specified severity: Secondary | ICD-10-CM | POA: Diagnosis not present

## 2021-02-28 DIAGNOSIS — E11622 Type 2 diabetes mellitus with other skin ulcer: Secondary | ICD-10-CM | POA: Diagnosis not present

## 2021-03-04 ENCOUNTER — Encounter (HOSPITAL_BASED_OUTPATIENT_CLINIC_OR_DEPARTMENT_OTHER): Payer: Medicare Other | Admitting: Physician Assistant

## 2021-03-04 DIAGNOSIS — J449 Chronic obstructive pulmonary disease, unspecified: Secondary | ICD-10-CM | POA: Diagnosis not present

## 2021-03-04 DIAGNOSIS — E1151 Type 2 diabetes mellitus with diabetic peripheral angiopathy without gangrene: Secondary | ICD-10-CM | POA: Diagnosis not present

## 2021-03-04 DIAGNOSIS — L97328 Non-pressure chronic ulcer of left ankle with other specified severity: Secondary | ICD-10-CM | POA: Diagnosis not present

## 2021-03-04 DIAGNOSIS — J9611 Chronic respiratory failure with hypoxia: Secondary | ICD-10-CM | POA: Diagnosis not present

## 2021-03-04 DIAGNOSIS — R2681 Unsteadiness on feet: Secondary | ICD-10-CM | POA: Diagnosis not present

## 2021-03-04 DIAGNOSIS — Z794 Long term (current) use of insulin: Secondary | ICD-10-CM | POA: Diagnosis not present

## 2021-03-04 DIAGNOSIS — R531 Weakness: Secondary | ICD-10-CM | POA: Diagnosis not present

## 2021-03-04 DIAGNOSIS — I739 Peripheral vascular disease, unspecified: Secondary | ICD-10-CM | POA: Diagnosis not present

## 2021-03-04 DIAGNOSIS — E11622 Type 2 diabetes mellitus with other skin ulcer: Secondary | ICD-10-CM | POA: Diagnosis not present

## 2021-03-06 DIAGNOSIS — E11622 Type 2 diabetes mellitus with other skin ulcer: Secondary | ICD-10-CM | POA: Diagnosis not present

## 2021-03-06 DIAGNOSIS — Z794 Long term (current) use of insulin: Secondary | ICD-10-CM | POA: Diagnosis not present

## 2021-03-06 DIAGNOSIS — R531 Weakness: Secondary | ICD-10-CM | POA: Diagnosis not present

## 2021-03-06 DIAGNOSIS — J449 Chronic obstructive pulmonary disease, unspecified: Secondary | ICD-10-CM | POA: Diagnosis not present

## 2021-03-06 DIAGNOSIS — E1151 Type 2 diabetes mellitus with diabetic peripheral angiopathy without gangrene: Secondary | ICD-10-CM | POA: Diagnosis not present

## 2021-03-06 DIAGNOSIS — R2681 Unsteadiness on feet: Secondary | ICD-10-CM | POA: Diagnosis not present

## 2021-03-06 DIAGNOSIS — I739 Peripheral vascular disease, unspecified: Secondary | ICD-10-CM | POA: Diagnosis not present

## 2021-03-06 DIAGNOSIS — J9611 Chronic respiratory failure with hypoxia: Secondary | ICD-10-CM | POA: Diagnosis not present

## 2021-03-06 DIAGNOSIS — L97328 Non-pressure chronic ulcer of left ankle with other specified severity: Secondary | ICD-10-CM | POA: Diagnosis not present

## 2021-03-11 DIAGNOSIS — I739 Peripheral vascular disease, unspecified: Secondary | ICD-10-CM | POA: Diagnosis not present

## 2021-03-11 DIAGNOSIS — R531 Weakness: Secondary | ICD-10-CM | POA: Diagnosis not present

## 2021-03-11 DIAGNOSIS — R2681 Unsteadiness on feet: Secondary | ICD-10-CM | POA: Diagnosis not present

## 2021-03-11 DIAGNOSIS — E1151 Type 2 diabetes mellitus with diabetic peripheral angiopathy without gangrene: Secondary | ICD-10-CM | POA: Diagnosis not present

## 2021-03-11 DIAGNOSIS — Z794 Long term (current) use of insulin: Secondary | ICD-10-CM | POA: Diagnosis not present

## 2021-03-11 DIAGNOSIS — J9611 Chronic respiratory failure with hypoxia: Secondary | ICD-10-CM | POA: Diagnosis not present

## 2021-03-11 DIAGNOSIS — E11622 Type 2 diabetes mellitus with other skin ulcer: Secondary | ICD-10-CM | POA: Diagnosis not present

## 2021-03-11 DIAGNOSIS — J449 Chronic obstructive pulmonary disease, unspecified: Secondary | ICD-10-CM | POA: Diagnosis not present

## 2021-03-11 DIAGNOSIS — L97328 Non-pressure chronic ulcer of left ankle with other specified severity: Secondary | ICD-10-CM | POA: Diagnosis not present

## 2021-03-14 DIAGNOSIS — Z794 Long term (current) use of insulin: Secondary | ICD-10-CM | POA: Diagnosis not present

## 2021-03-14 DIAGNOSIS — R531 Weakness: Secondary | ICD-10-CM | POA: Diagnosis not present

## 2021-03-14 DIAGNOSIS — J9611 Chronic respiratory failure with hypoxia: Secondary | ICD-10-CM | POA: Diagnosis not present

## 2021-03-14 DIAGNOSIS — L97328 Non-pressure chronic ulcer of left ankle with other specified severity: Secondary | ICD-10-CM | POA: Diagnosis not present

## 2021-03-14 DIAGNOSIS — J449 Chronic obstructive pulmonary disease, unspecified: Secondary | ICD-10-CM | POA: Diagnosis not present

## 2021-03-14 DIAGNOSIS — E11622 Type 2 diabetes mellitus with other skin ulcer: Secondary | ICD-10-CM | POA: Diagnosis not present

## 2021-03-14 DIAGNOSIS — R2681 Unsteadiness on feet: Secondary | ICD-10-CM | POA: Diagnosis not present

## 2021-03-14 DIAGNOSIS — I739 Peripheral vascular disease, unspecified: Secondary | ICD-10-CM | POA: Diagnosis not present

## 2021-03-14 DIAGNOSIS — E1151 Type 2 diabetes mellitus with diabetic peripheral angiopathy without gangrene: Secondary | ICD-10-CM | POA: Diagnosis not present

## 2021-03-17 ENCOUNTER — Encounter (HOSPITAL_BASED_OUTPATIENT_CLINIC_OR_DEPARTMENT_OTHER): Payer: Medicare Other | Admitting: Internal Medicine

## 2021-03-17 ENCOUNTER — Other Ambulatory Visit: Payer: Self-pay

## 2021-03-17 DIAGNOSIS — S91002A Unspecified open wound, left ankle, initial encounter: Secondary | ICD-10-CM | POA: Diagnosis not present

## 2021-03-17 DIAGNOSIS — E11621 Type 2 diabetes mellitus with foot ulcer: Secondary | ICD-10-CM

## 2021-03-17 DIAGNOSIS — I10 Essential (primary) hypertension: Secondary | ICD-10-CM | POA: Diagnosis not present

## 2021-03-17 DIAGNOSIS — Z87891 Personal history of nicotine dependence: Secondary | ICD-10-CM | POA: Diagnosis not present

## 2021-03-17 DIAGNOSIS — S91002D Unspecified open wound, left ankle, subsequent encounter: Secondary | ICD-10-CM | POA: Diagnosis not present

## 2021-03-17 DIAGNOSIS — I69354 Hemiplegia and hemiparesis following cerebral infarction affecting left non-dominant side: Secondary | ICD-10-CM | POA: Diagnosis not present

## 2021-03-17 DIAGNOSIS — E1136 Type 2 diabetes mellitus with diabetic cataract: Secondary | ICD-10-CM | POA: Diagnosis not present

## 2021-03-17 DIAGNOSIS — E1151 Type 2 diabetes mellitus with diabetic peripheral angiopathy without gangrene: Secondary | ICD-10-CM | POA: Diagnosis not present

## 2021-03-17 DIAGNOSIS — I739 Peripheral vascular disease, unspecified: Secondary | ICD-10-CM | POA: Diagnosis not present

## 2021-03-17 DIAGNOSIS — J449 Chronic obstructive pulmonary disease, unspecified: Secondary | ICD-10-CM | POA: Diagnosis not present

## 2021-03-17 NOTE — Progress Notes (Signed)
Crystal Moore ([id number]) Visit Report for 03/17/2021 Chief Complaint Document Details Patient Name: Date of Service: Crystal Moore, Crystal Moore 03/17/2021 3:30 PM Medical Record Number: [id number] Patient Account Number: [id number] Date of Birth/Sex: Treating RN: 1945-08-20 (75 y.o. [name] Primary Care Provider: [name] Other Clinician: Referring Provider: Treating Provider/Extender: [name] Weeks in Treatment: 2 Information Obtained from: Patient Chief Complaint 08/08/2020; patient is here accompanied by her grandson for review of wounds on the left lateral malleolus as well as an area on the right upper mid tibia 02/25/2021; patient is accompanied by daughter for review of her left lateral malleolus wound Electronic Signature(s) Signed: 03/17/2021 4:41:58 PM By: Kalman Shan DO Entered By: Kalman Shan on 03/17/2021 16:35:47 -------------------------------------------------------------------------------- HPI Details Patient Name: Date of Service: Crystal Moore. 03/17/2021 3:30 PM Medical Record Number: [id number] Patient Account Number: [id number] Date of Birth/Sex: Treating RN: 1945/09/18 (75 y.o. [name] Primary Care Provider: [name] Other Clinician: Referring Provider: Treating Provider/Extender: [name] Weeks in Treatment: 2 History of Present Illness HPI Description: Admission 02/25/2021 Ms. Crystal Moore is a 75 year old female with a past medical history of uncontrolled insulin-dependent type 2 diabetes, CVA with left hemiparesis, vascular dementia and peripheral vascular disease that presents to the clinic for worsening left lateral malleolus wound. She was treated in our clinic earlier this year for the wound. It was well-healing at that time however she has not followed up since March. She has noticed a decline in the past 1.5 months. She has been using silver  alginate to the area. She currently denies signs of infection. She does report tenderness to the wound bed. 8/22; patient presents for follow-up. Daughter is not present today. Patient has no issues or complaints today. She states that her daughter is doing the dressing changes. She has not obtained the x-ray ordered at last clinic visit. She is currently not in pain. Electronic Signature(s) Signed: 03/17/2021 4:41:58 PM By: Kalman Shan DO Entered By: Kalman Shan on 03/17/2021 16:37:45 -------------------------------------------------------------------------------- Physical Exam Details Patient Name: Date of Service: Crystal Moore. 03/17/2021 3:30 PM Medical Record Number: [id number] Patient Account Number: [id number] Date of Birth/Sex: Treating RN: 1946/06/24 (75 y.o. [name] Primary Care Provider: [name] Other Clinician: Referring Provider: Treating Provider/Extender: [name] Weeks in Treatment: 2 Constitutional respirations regular, non-labored and within target range for patient.Marland Kitchen Psychiatric pleasant and cooperative. Notes Left leg: T the lateral malleolus there is an open wound with granulation tissue and some nonviable tissue present. Pedal pulses difficult to palpate. o Electronic Signature(s) Signed: 03/17/2021 4:41:58 PM By: Kalman Shan DO Entered By: Kalman Shan on 03/17/2021 16:38:30 -------------------------------------------------------------------------------- Physician Orders Details Patient Name: Date of Service: Crystal Moore. 03/17/2021 3:30 PM Medical Record Number: [id number] Patient Account Number: [id number] Date of Birth/Sex: Treating RN: October 27, 1945 (75 y.o. [name] Primary Care Provider: [name] Other Clinician: Referring Provider: Treating Provider/Extender: [name] Weeks in Treatment: 2 Verbal / Phone Orders:  No Diagnosis Coding ICD-10 Coding Code Description S91.002D Unspecified open wound, left ankle, subsequent encounter I73.9 Peripheral vascular disease, unspecified E11.621 Type 2 diabetes mellitus with foot ulcer Follow-up Appointments Return A ppointment in 1 week. Other: - -Please obtain X-ray by next appt -Make follow up appt with vascular and vein Bathing/ Shower/ Hygiene May shower with protection but do not get wound dressing(s) wet. May shower and wash wound with soap and water. - may wash with soap and water  with dressing change. Edema Control - Lymphedema / SCD / Other Elevate legs to the level of the heart or above for 30 minutes daily and/or when sitting, a frequency of: - throughout the day. Avoid standing for long periods of time. Off-Loading Other: - ensure no pressure to left ankle wound. wear boots to relieve pressure from heels. Home Health No change in wound care orders this week; continue Home Health for wound care. May utilize formulary equivalent dressing for wound treatment orders unless otherwise specified. - Home health to change twice a week. Other Home Health Orders/Instructions: - [facility] Wound Treatment Wound #4 - Ankle Wound Laterality: Left, Lateral Cleanser: Soap and Water Every Other Day/15 Days Discharge Instructions: May shower and wash wound with dial antibacterial soap and water prior to dressing change. Cleanser: Wound Cleanser [facility]) Every Other Day/15 Days Discharge Instructions: Cleanse the wound with wound cleanser prior to applying a clean dressing using gauze sponges, not tissue or cotton balls. Prim Dressing: KerraCel Ag Gelling Fiber Dressing, 4x5 in (silver alginate) (Home Health) Every Other Day/15 Days ary Discharge Instructions: Apply silver alginate to wound bed as instructed Secondary Dressing: Woven Gauze Sponge, Non-Sterile 4x4 in [facility]) Every Other Day/15 Days Discharge Instructions: Apply over primary dressing  as directed. Secondary Dressing: ABD Pad, 5x9 [facility]) Every Other Day/15 Days Discharge Instructions: Apply over primary dressing as directed. Secondary Dressing: Optifoam Non-Adhesive Dressing, 4x4 in Every Other Day/15 Days Discharge Instructions: Apply foam donut for offloading Secured With: Kerlix Roll Sterile, 4.5x3.1 (in/yd) (Home Health) Every Other Day/15 Days Discharge Instructions: Secure with Kerlix as directed. Secured With: 28M Medipore H Soft Cloth Surgical T 4 x 2 (in/yd) (Home Health) Every Other Day/15 Days ape Discharge Instructions: Secure dressing with tape as directed. Electronic Signature(s) Signed: 03/17/2021 4:41:58 PM By: Kalman Shan DO Previous Signature: 03/17/2021 4:34:27 PM Version By: Kalman Shan DO Entered By: Kalman Shan on 03/17/2021 16:38:49 -------------------------------------------------------------------------------- Problem List Details Patient Name: Date of Service: Crystal Moore. 03/17/2021 3:30 PM Medical Record Number: [id number] Patient Account Number: [id number] Date of Birth/Sex: Treating RN: June 03, 1946 (75 y.o. [name] Primary Care Provider: [name] Other Clinician: Referring Provider: Treating Provider/Extender: [name] Weeks in Treatment: 2 Active Problems ICD-10 Encounter Code Description Active Date MDM Diagnosis S91.002D Unspecified open wound, left ankle, subsequent encounter 03/17/2021 No Yes I73.9 Peripheral vascular disease, unspecified 02/25/2021 No Yes E11.621 Type 2 diabetes mellitus with foot ulcer 02/25/2021 No Yes Inactive Problems ICD-10 Code Description Active Date Inactive Date S91.002A Unspecified open wound, left ankle, initial encounter 02/25/2021 02/25/2021 Resolved Problems Electronic Signature(s) Signed: 03/17/2021 4:41:58 PM By: Kalman Shan DO Entered By: Kalman Shan on 03/17/2021  16:34:54 -------------------------------------------------------------------------------- Progress Note Details Patient Name: Date of Service: Crystal Moore. 03/17/2021 3:30 PM Medical Record Number: [id number] Patient Account Number: [id number] Date of Birth/Sex: Treating RN: [birth year] (75 y.o. [name] Primary Care Provider: [name] Other Clinician: Referring Provider: Treating Provider/Extender: [name] Weeks in Treatment: 2 Subjective Chief Complaint Information obtained from Patient 08/08/2020; patient is here accompanied by her grandson for review of wounds on the left lateral malleolus as well as an area on the right upper mid tibia 02/25/2021; patient is accompanied by daughter for review of her left lateral malleolus wound History of Present Illness (HPI) Admission 02/25/2021 Ms. Shirley Decamp is a 75 year old female with a past medical history of uncontrolled insulin-dependent type 2 diabetes, CVA with left hemiparesis, vascular dementia and peripheral  vascular disease that presents to the clinic for worsening left lateral malleolus wound. She was treated in our clinic earlier this year for the wound. It was well-healing at that time however she has not followed up since March. She has noticed a decline in the past 1.5 months. She has been using silver alginate to the area. She currently denies signs of infection. She does report tenderness to the wound bed. 8/22; patient presents for follow-up. Daughter is not present today. Patient has no issues or complaints today. She states that her daughter is doing the dressing changes. She has not obtained the x-ray ordered at last clinic visit. She is currently not in pain. Patient History Unable to Obtain Patient History due to Aphasia. Information obtained from Patient. Family History Unknown History. Social History Former smoker - quit 2 years, Marital Status - Single, Alcohol  Use - Never, Drug Use - No History, Caffeine Use - Rarely. Medical History Eyes Patient has history of Cataracts - bil removed Respiratory Patient has history of Chronic Obstructive Pulmonary Disease (COPD) Cardiovascular Patient has history of Hypertension Endocrine Patient has history of Type II Diabetes Musculoskeletal Patient has history of Osteoarthritis Neurologic Patient has history of Seizure Disorder Psychiatric Denies history of Anorexia/bulimia, Confinement Anxiety Medical A Surgical History Notes nd Cardiovascular CVA Genitourinary Pyelonephritis Neurologic left hemiparesis, CVA Objective Constitutional respirations regular, non-labored and within target range for patient.. Vitals Time Taken: 3:12 PM, Height: 61 in, Weight: 128 lbs, BMI: 24.2, Temperature: 98.1 F, Pulse: 90 bpm, Respiratory Rate: 18 breaths/min, Blood Pressure: 190/75 mmHg. Psychiatric pleasant and cooperative. General Notes: Left leg: T the lateral malleolus there is an open wound with granulation tissue and some nonviable tissue present. Pedal pulses difficult to o palpate. Integumentary (Hair, Skin) Wound #4 status is Open. Original cause of wound was Gradually Appeared. The date acquired was: 02/25/2020. The wound has been in treatment 2 weeks. The wound is located on the Left,Lateral Ankle. The wound measures 2.3cm length x 2.3cm width x 0.6cm depth; 4.155cm^2 area and 2.493cm^3 volume. There is tendon and Fat Layer (Subcutaneous Tissue) exposed. There is no tunneling or undermining noted. There is a medium amount of serosanguineous drainage noted. The wound margin is distinct with the outline attached to the wound base. There is medium (34-66%) red granulation within the wound bed. There is a medium (34-66%) amount of necrotic tissue within the wound bed including Adherent Slough. Assessment Active Problems ICD-10 Unspecified open wound, left ankle, subsequent encounter Peripheral  vascular disease, unspecified Type 2 diabetes mellitus with foot ulcer Patient's wound is stable with no signs of infection. I am not able to have a productive conversation with the patient due to her likely underlying dementia. I recommended she continue with silver alginate. She will have an after visit summary to give to her daughter. I would like for her to follow-up with vein and vascular due to her significant history of peripheral arterial disease. We will go ahead and place the referral since I am not sure if the patient can do this. Also recommended she obtain her left foot x-ray obtained at last clinic visit. Plan Follow-up Appointments: Return Appointment in 1 week. Other: - -Please obtain X-ray by next appt -Make follow up appt with vascular and vein Bathing/ Shower/ Hygiene: May shower with protection but do not get wound dressing(s) wet. May shower and wash wound with soap and water. - may wash with soap and water with dressing change. Edema Control - Lymphedema / SCD / Other:  Elevate legs to the level of the heart or above for 30 minutes daily and/or when sitting, a frequency of: - throughout the day. Avoid standing for long periods of time. Off-Loading: Other: - ensure no pressure to left ankle wound. wear boots to relieve pressure from heels. Home Health: No change in wound care orders this week; continue Home Health for wound care. May utilize formulary equivalent dressing for wound treatment orders unless otherwise specified. - Home health to change twice a week. Other Home Health Orders/Instructions: - [facility] WOUND #4: - Ankle Wound Laterality: Left, Lateral Cleanser: Soap and Water Every Other Day/15 Days Discharge Instructions: May shower and wash wound with dial antibacterial soap and water prior to dressing change. Cleanser: Wound Cleanser [facility]) Every Other Day/15 Days Discharge Instructions: Cleanse the wound with wound cleanser prior to applying a clean  dressing using gauze sponges, not tissue or cotton balls. Prim Dressing: KerraCel Ag Gelling Fiber Dressing, 4x5 in (silver alginate) (Home Health) Every Other Day/15 Days ary Discharge Instructions: Apply silver alginate to wound bed as instructed Secondary Dressing: Woven Gauze Sponge, Non-Sterile 4x4 in [facility]) Every Other Day/15 Days Discharge Instructions: Apply over primary dressing as directed. Secondary Dressing: ABD Pad, 5x9 [facility] Every Other Day/15 Days Discharge Instructions: Apply over primary dressing as directed. Secondary Dressing: Optifoam Non-Adhesive Dressing, 4x4 in Every Other Day/15 Days Discharge Instructions: Apply foam donut for offloading Secured With: Kerlix Roll Sterile, 4.5x3.1 (in/yd) (Home Health) Every Other Day/15 Days Discharge Instructions: Secure with Kerlix as directed. Secured With: 19M Medipore H Soft Cloth Surgical T 4 x 2 (in/yd) (Home Health) Every Other Day/15 Days ape Discharge Instructions: Secure dressing with tape as directed. 1. Continue silver alginate 2. Referral to vein and vascular 3. Follow-up in 1 week Electronic Signature(s) Signed: 03/17/2021 4:41:58 PM By: Kalman Shan DO Entered By: Kalman Shan on 03/17/2021 16:41:14 -------------------------------------------------------------------------------- HxROS Details Patient Name: Date of Service: Crystal Moore. 03/17/2021 3:30 PM Medical Record Number: [id number] Patient Account Number: [id number] Date of Birth/Sex: Treating RN: 1945-09-13 (75 y.o. [name] Primary Care Provider: [name] Other Clinician: Referring Provider: Treating Provider/Extender: [name] Weeks in Treatment: 2 Unable to Obtain Patient History due to Aphasia Information Obtained From Patient Eyes Medical History: Positive for: Cataracts - bil removed Respiratory Medical History: Positive for: Chronic Obstructive Pulmonary  Disease (COPD) Cardiovascular Medical History: Positive for: Hypertension Past Medical History Notes: CVA Endocrine Medical History: Positive for: Type II Diabetes Time with diabetes: >30 yrs Treated with: Insulin, Oral agents Blood sugar tested every day: No Genitourinary Medical History: Past Medical History Notes: Pyelonephritis Musculoskeletal Medical History: Positive for: Osteoarthritis Neurologic Medical History: Positive for: Seizure Disorder Past Medical History Notes: left hemiparesis, CVA Psychiatric Medical History: Negative for: Anorexia/bulimia; Confinement Anxiety HBO Extended History Items Eyes: Cataracts Immunizations Pneumococcal Vaccine: Received Pneumococcal Vaccination: Yes Received Pneumococcal Vaccination On or After 60th Birthday: Yes Implantable Devices None Family and Social History Unknown History: Yes; Former smoker - quit 2 years; Marital Status - Single; Alcohol Use: Never; Drug Use: No History; Caffeine Use: Rarely; Financial Concerns: No; Food, Clothing or Shelter Needs: No; Support System Lacking: No; Transportation Concerns: No Electronic Signature(s) Signed: 03/17/2021 4:41:58 PM By: Kalman Shan DO Signed: 03/17/2021 5:26:48 PM By: Lorrin Jackson Entered By: Kalman Shan on 03/17/2021 16:38:08 -------------------------------------------------------------------------------- [place] Details Patient Name: Date of Service: Crystal Moore 03/17/2021 Medical Record Number: [id number] Patient Account Number: [id number] Date of Birth/Sex: Treating RN: 1945-09-29 (75 y.o.  [name] Primary Care Provider: [name] Other Clinician: Referring Provider: Treating Provider/Extender: [name] Weeks in Treatment: 2 Diagnosis Coding ICD-10 Codes Code Description [phone] Unspecified open wound, left ankle, subsequent encounter I73.9 Peripheral vascular disease,  unspecified E11.621 Type 2 diabetes mellitus with foot ulcer Facility Procedures CPT4 Code: [id number] Description: 99213 - WOUND CARE VISIT-LEV 3 EST PT Modifier: Quantity: 1 Physician Procedures : CPT4 Code Description Modifier [id number] - WC PHYS LEVEL 3 - EST PT ICD-10 Diagnosis Description S91.002D Unspecified open wound, left ankle, subsequent encounter I73.9 Peripheral vascular disease, unspecified E11.621 Type 2 diabetes mellitus with  foot ulcer Quantity: 1 Electronic Signature(s) Signed: 03/17/2021 4:41:58 PM By: Kalman Shan DO Previous Signature: 03/17/2021 4:34:27 PM Version By: Kalman Shan DO Entered By: Kalman Shan on 03/17/2021 16:41:34

## 2021-03-18 DIAGNOSIS — E1151 Type 2 diabetes mellitus with diabetic peripheral angiopathy without gangrene: Secondary | ICD-10-CM | POA: Diagnosis not present

## 2021-03-18 DIAGNOSIS — J449 Chronic obstructive pulmonary disease, unspecified: Secondary | ICD-10-CM | POA: Diagnosis not present

## 2021-03-18 DIAGNOSIS — R2681 Unsteadiness on feet: Secondary | ICD-10-CM | POA: Diagnosis not present

## 2021-03-18 DIAGNOSIS — E11622 Type 2 diabetes mellitus with other skin ulcer: Secondary | ICD-10-CM | POA: Diagnosis not present

## 2021-03-18 DIAGNOSIS — J9611 Chronic respiratory failure with hypoxia: Secondary | ICD-10-CM | POA: Diagnosis not present

## 2021-03-18 DIAGNOSIS — R531 Weakness: Secondary | ICD-10-CM | POA: Diagnosis not present

## 2021-03-18 DIAGNOSIS — L97328 Non-pressure chronic ulcer of left ankle with other specified severity: Secondary | ICD-10-CM | POA: Diagnosis not present

## 2021-03-18 DIAGNOSIS — Z794 Long term (current) use of insulin: Secondary | ICD-10-CM | POA: Diagnosis not present

## 2021-03-18 DIAGNOSIS — I739 Peripheral vascular disease, unspecified: Secondary | ICD-10-CM | POA: Diagnosis not present

## 2021-03-19 NOTE — Progress Notes (Addendum)
Crystal Moore ([id number]) Visit Report for 03/17/2021 Arrival Information Details Patient Name: Date of Service: Crystal Moore, Crystal Moore 03/17/2021 3:30 PM Medical Record Number: [id number] Patient Account Number: [id number] Date of Birth/Sex: Treating RN: [birth year] (75 y.o. [name] Primary Care Rhyli Depaula: [name] Other Clinician: Referring Ashvin Adelson: Treating Portia Wisdom/Extender: [name] Weeks in Treatment: 2 Visit Information History Since Last Visit Added or deleted any medications: No Patient Arrived: Wheel Chair Any new allergies or adverse reactions: No Arrival Time: 15:12 Had a fall or experienced change in No Accompanied By: son activities of daily living that may affect Transfer Assistance: None risk of falls: Patient Identification Verified: Yes Signs or symptoms of abuse/neglect since last visito No Secondary Verification Process Completed: Yes Hospitalized since last visit: No Patient Requires Transmission-Based Precautions: No Implantable device outside of the clinic excluding No Patient Has Alerts: Yes cellular tissue based products placed in the center Patient Alerts: R ABI=1.06 TBI=.67 since last visit: L ABI=.96 TBI=.66 Has Dressing in Place as Prescribed: Yes Pain Present Now: No Electronic Signature(s) Signed: 03/19/2021 9:12:52 AM By: Sandre Kitty Entered By: Sandre Kitty on 03/17/2021 15:12:36 -------------------------------------------------------------------------------- Clinic Level of Care Assessment Details Patient Name: Date of Service: Crystal Moore 03/17/2021 3:30 PM Medical Record Number: [id number] Patient Account Number: [id number] Date of Birth/Sex: Treating RN: [birth year] (75 y.o. [name] Primary Care Vani Gunner: [name] Other Clinician: Referring Sai Zinn: Treating Christropher Gintz/Extender: [name] Weeks in Treatment: 2 Clinic  Level of Care Assessment Items TOOL 4 Quantity Score X- 1 0 Use when only an EandM is performed on FOLLOW-UP visit ASSESSMENTS - Nursing Assessment / Reassessment X- 1 10 Reassessment of Co-morbidities (includes updates in patient status) X- 1 5 Reassessment of Adherence to Treatment Plan ASSESSMENTS - Wound and Skin A ssessment / Reassessment X - Simple Wound Assessment / Reassessment - one wound 1 5 []  - 0 Complex Wound Assessment / Reassessment - multiple wounds []  - 0 Dermatologic / Skin Assessment (not related to wound area) ASSESSMENTS - Focused Assessment []  - 0 Circumferential Edema Measurements - multi extremities []  - 0 Nutritional Assessment / Counseling / Intervention []  - 0 Lower Extremity Assessment (monofilament, tuning fork, pulses) []  - 0 Peripheral Arterial Disease Assessment (using hand held doppler) ASSESSMENTS - Ostomy and/or Continence Assessment and Care []  - 0 Incontinence Assessment and Management []  - 0 Ostomy Care Assessment and Management (repouching, etc.) PROCESS - Coordination of Care []  - 0 Simple Patient / Family Education for ongoing care X- 1 20 Complex (extensive) Patient / Family Education for ongoing care X- 1 10 Staff obtains Programmer, systems, Records, T Results / Process Orders est X- 1 10 Staff telephones HHA, Nursing Homes / Clarify orders / etc []  - 0 Routine Transfer to another Facility (non-emergent condition) []  - 0 Routine Hospital Admission (non-emergent condition) []  - 0 New Admissions / Biomedical engineer / Ordering NPWT Apligraf, etc. , []  - 0 Emergency Hospital Admission (emergent condition) []  - 0 Simple Discharge Coordination []  - 0 Complex (extensive) Discharge Coordination PROCESS - Special Needs []  - 0 Pediatric / Minor Patient Management []  - 0 Isolation Patient Management []  - 0 Hearing / Language / Visual special needs []  - 0 Assessment of Community assistance (transportation, D/C planning, etc.) []   - 0 Additional assistance / Altered mentation []  - 0 Support Surface(s) Assessment (bed, cushion, seat, etc.) INTERVENTIONS - Wound Cleansing / Measurement X - Simple Wound Cleansing - one wound 1 5 []  - 0  Complex Wound Cleansing - multiple wounds X- 1 5 Wound Imaging (photographs - any number of wounds) []  - 0 Wound Tracing (instead of photographs) X- 1 5 Simple Wound Measurement - one wound []  - 0 Complex Wound Measurement - multiple wounds INTERVENTIONS - Wound Dressings []  - 0 Small Wound Dressing one or multiple wounds X- 1 15 Medium Wound Dressing one or multiple wounds []  - 0 Large Wound Dressing one or multiple wounds []  - 0 Application of Medications - topical []  - 0 Application of Medications - injection INTERVENTIONS - Miscellaneous []  - 0 External ear exam []  - 0 Specimen Collection (cultures, biopsies, blood, body fluids, etc.) []  - 0 Specimen(s) / Culture(s) sent or taken to Lab for analysis []  - 0 Patient Transfer (multiple staff / Civil Service fast streamer / Similar devices) []  - 0 Simple Staple / Suture removal (25 or less) []  - 0 Complex Staple / Suture removal (26 or more) []  - 0 Hypo / Hyperglycemic Management (close monitor of Blood Glucose) []  - 0 Ankle / Brachial Index (ABI) - do not check if billed separately X- 1 5 Vital Signs Has the patient been seen at the hospital within the last three years: Yes Total Score: 95 Level Of Care: New/Established - Level 3 Electronic Signature(s) Signed: 03/17/2021 5:26:48 PM By: Lorrin Jackson Entered By: Lorrin Jackson on 03/17/2021 16:27:18 -------------------------------------------------------------------------------- Lower Extremity Assessment Details Patient Name: Date of Service: Crystal Lennox. 03/17/2021 3:30 PM Medical Record Number: [id number] Patient Account Number: [id number] Date of Birth/Sex: Treating RN: 11/11/1945 (76 y.o. [name] Primary Care Lurlene Ronda: [name] Other  Clinician: Referring Kristelle Cavallaro: Treating Ansen Sayegh/Extender: [name] Weeks in Treatment: 2 Edema Assessment Assessed: [Left: No] [Right: No] Edema: [Left: Ye] [Right: s] Calf Left: Right: Point of Measurement: From Medial Instep 31 cm Ankle Left: Right: Point of Measurement: From Medial Instep 21.5 cm Vascular Assessment Pulses: Dorsalis Pedis Palpable: [Left:Yes] Electronic Signature(s) Signed: 03/17/2021 5:14:01 PM By: Baruch Gouty RN, BSN Entered By: Baruch Gouty on 03/17/2021 15:25:12 -------------------------------------------------------------------------------- Multi Wound Chart Details Patient Name: Date of Service: Crystal Lennox. 03/17/2021 3:30 PM Medical Record Number: [id number] Patient Account Number: [id number] Date of Birth/Sex: Treating RN: [birth year] (75 y.o. [name] Primary Care Nathasha Fiorillo: [name] Other Clinician: Referring Aydia Maj: Treating Oluwatosin Bracy/Extender: [name] Weeks in Treatment: 2 Vital Signs Height(in): 61 Pulse(bpm): 90 Weight(lbs): 128 Blood Pressure(mmHg): 190/75 Body Mass Index(BMI): 24 Temperature(F): 98.1 Respiratory Rate(breaths/min): 18 Photos: [4:No Photos Left, Lateral Ankle] [N/A:N/A N/A] Wound Location: [4:Gradually Appeared] [N/A:N/A] Wounding Event: [4:Diabetic Wound/Ulcer of the Lower] [N/A:N/A] Primary Etiology: [4:Extremity Venous Leg Ulcer] [N/A:N/A] Secondary Etiology: [4:Cataracts, Chronic Obstructive] [N/A:N/A] Comorbid History: [4:Pulmonary Disease (COPD), Hypertension, Type II Diabetes, Osteoarthritis, Seizure Disorder 02/25/2020] [N/A:N/A] Date Acquired: [4:2] [N/A:N/A] Weeks of Treatment: [4:Open] [N/A:N/A] Wound Status: [4:2.3x2.3x0.6] [N/A:N/A] Measurements L x W x D (cm) [4:4.155] [N/A:N/A] A (cm) : rea [4:2.493] [N/A:N/A] Volume (cm) : [4:-4.60%] [N/A:N/A] % Reduction in A rea: [4:21.60%] [N/A:N/A] % Reduction in  Volume: [4:Grade 2] [N/A:N/A] Classification: [4:Medium] [N/A:N/A] Exudate A mount: [4:Serosanguineous] [N/A:N/A] Exudate Type: [4:red, brown] [N/A:N/A] Exudate Color: [4:Distinct, outline attached] [N/A:N/A] Wound Margin: [4:Medium (34-66%)] [N/A:N/A] Granulation A mount: [4:Red] [N/A:N/A] Granulation Quality: [4:Medium (34-66%)] [N/A:N/A] Necrotic A mount: [4:Fat Layer (Subcutaneous Tissue): Yes N/A] Exposed Structures: [4:Tendon: Yes Fascia: No Muscle: No Joint: No Bone: No None] [N/A:N/A] Treatment Notes Electronic Signature(s) Signed: 03/17/2021 4:41:58 PM By: Kalman Shan DO Signed: 03/17/2021 5:26:48 PM By: Lorrin Jackson Entered By: Kalman Shan on  03/17/2021 16:35:37 -------------------------------------------------------------------------------- Multi-Disciplinary Care Plan Details Patient Name: Date of Service: Crystal Moore, Crystal Moore 03/17/2021 3:30 PM Medical Record Number: [id number] Patient Account Number: [id number] Date of Birth/Sex: Treating RN: [birth year] (75 y.o. [name] Primary Care Tyri Elmore: [name] Other Clinician: Referring Naheim Burgen: Treating Dreanna Kyllo/Extender: [name] Weeks in Treatment: 2 Active Inactive Electronic Signature(s) Signed: 05/01/2021 5:23:26 PM By: Lorrin Jackson Previous Signature: 03/17/2021 5:26:48 PM Version By: Lorrin Jackson Entered By: Lorrin Jackson on 05/01/2021 17:23:26 -------------------------------------------------------------------------------- Pain Assessment Details Patient Name: Date of Service: Crystal Lennox. 03/17/2021 3:30 PM Medical Record Number: [id number] Patient Account Number: [id number] Date of Birth/Sex: Treating RN: 1945-11-16 (75 y.o. [name] Primary Care Raedyn Wenke: [name] Other Clinician: Referring Faige Seely: Treating Mykelti Goldenstein/Extender: [name] Weeks in Treatment: 2 Active  Problems Location of Pain Severity and Description of Pain Patient Has Paino No Site Locations Rate the pain. Current Pain Level: 0 Pain Management and Medication Current Pain Management: Electronic Signature(s) Signed: 03/17/2021 5:14:01 PM By: Baruch Gouty RN, BSN Signed: 03/17/2021 5:26:48 PM By: Lorrin Jackson Entered By: Baruch Gouty on 03/17/2021 15:25:24 -------------------------------------------------------------------------------- Patient/Caregiver Education Details Patient Name: Date of Service: Crystal Lennox 8/22/2022andnbsp3:30 PM Medical Record Number: [id number] Patient Account Number: [id number] Date of Birth/Gender: Treating RN: [birth year] (75 y.o. [name] Primary Care Physician: [name] Other Clinician: Referring Physician: Treating Physician/Extender: [name] in Treatment: 2 Education Assessment Education Provided To: Patient Education Topics Provided Wound/Skin Impairment: Methods: Explain/Verbal, Printed Responses: State content correctly Electronic Signature(s) Signed: 03/17/2021 5:26:48 PM By: Lorrin Jackson Entered By: Lorrin Jackson on 03/17/2021 16:22:02 -------------------------------------------------------------------------------- Wound Assessment Details Patient Name: Date of Service: Crystal Lennox. 03/17/2021 3:30 PM Medical Record Number: [id number] Patient Account Number: [id number] Date of Birth/Sex: Treating RN: 1946/01/30 (75 y.o. [name] Primary Care Vylette Strubel: [name] Other Clinician: Referring Zendayah Hardgrave: Treating Anjelo Pullman/Extender: [name] Weeks in Treatment: 2 Wound Status Wound Number: 4 Primary Diabetic Wound/Ulcer of the Lower Extremity Etiology: Wound Location: Left, Lateral Ankle Secondary Venous Leg Ulcer Wounding Event: Gradually Appeared Etiology: Date Acquired: 02/25/2020 Wound Open Weeks  Of Treatment: 2 Status: Clustered Wound: No Comorbid Cataracts, Chronic Obstructive Pulmonary Disease (COPD), History: Hypertension, Type II Diabetes, Osteoarthritis, Seizure Disorder Wound Measurements Length: (cm) 2.3 Width: (cm) 2.3 Depth: (cm) 0.6 Area: (cm) 4.155 Volume: (cm) 2.493 % Reduction in Area: -4.6% % Reduction in Volume: 21.6% Epithelialization: None Tunneling: No Undermining: No Wound Description Classification: Grade 2 Wound Margin: Distinct, outline attached Exudate Amount: Medium Exudate Type: Serosanguineous Exudate Color: red, brown Foul Odor After Cleansing: No Slough/Fibrino Yes Wound Bed Granulation Amount: Medium (34-66%) Exposed Structure Granulation Quality: Red Fascia Exposed: No Necrotic Amount: Medium (34-66%) Fat Layer (Subcutaneous Tissue) Exposed: Yes Necrotic Quality: Adherent Slough Tendon Exposed: Yes Muscle Exposed: No Joint Exposed: No Bone Exposed: No Electronic Signature(s) Signed: 03/17/2021 5:14:01 PM By: Baruch Gouty RN, BSN Signed: 03/17/2021 5:26:48 PM By: Lorrin Jackson Entered By: Baruch Gouty on 03/17/2021 15:26:39 -------------------------------------------------------------------------------- Vitals Details Patient Name: Date of Service: Crystal Lennox. 03/17/2021 3:30 PM Medical Record Number: [id number] Patient Account Number: [id number] Date of Birth/Sex: Treating RN: 02-May-1946 (75 y.o. [name] Primary Care Quincey Nored: [name] Other Clinician: Referring Jeane Cashatt: Treating Cova Knieriem/Extender: [name] Weeks in Treatment: 2 Vital Signs Time Taken: 15:12 Temperature (F): 98.1 Height (in): 61 Pulse (bpm): 90 Weight (lbs): 128 Respiratory Rate (breaths/min): 18 Body Mass Index (BMI): 24.2 Blood Pressure (mmHg): 190/75 Reference  Range: 80 - 120 mg / dl Electronic Signature(s) Signed: 03/19/2021 9:12:52 AM By: Sandre Kitty Entered By: Sandre Kitty on 03/17/2021 15:12:53

## 2021-03-20 DIAGNOSIS — J9611 Chronic respiratory failure with hypoxia: Secondary | ICD-10-CM | POA: Diagnosis not present

## 2021-03-20 DIAGNOSIS — R531 Weakness: Secondary | ICD-10-CM | POA: Diagnosis not present

## 2021-03-20 DIAGNOSIS — L97328 Non-pressure chronic ulcer of left ankle with other specified severity: Secondary | ICD-10-CM | POA: Diagnosis not present

## 2021-03-20 DIAGNOSIS — E11622 Type 2 diabetes mellitus with other skin ulcer: Secondary | ICD-10-CM | POA: Diagnosis not present

## 2021-03-20 DIAGNOSIS — I739 Peripheral vascular disease, unspecified: Secondary | ICD-10-CM | POA: Diagnosis not present

## 2021-03-20 DIAGNOSIS — J449 Chronic obstructive pulmonary disease, unspecified: Secondary | ICD-10-CM | POA: Diagnosis not present

## 2021-03-20 DIAGNOSIS — Z794 Long term (current) use of insulin: Secondary | ICD-10-CM | POA: Diagnosis not present

## 2021-03-20 DIAGNOSIS — R2681 Unsteadiness on feet: Secondary | ICD-10-CM | POA: Diagnosis not present

## 2021-03-20 DIAGNOSIS — E1151 Type 2 diabetes mellitus with diabetic peripheral angiopathy without gangrene: Secondary | ICD-10-CM | POA: Diagnosis not present

## 2021-03-24 ENCOUNTER — Encounter (HOSPITAL_BASED_OUTPATIENT_CLINIC_OR_DEPARTMENT_OTHER): Payer: Medicare Other | Admitting: Internal Medicine

## 2021-03-25 DIAGNOSIS — I739 Peripheral vascular disease, unspecified: Secondary | ICD-10-CM | POA: Diagnosis not present

## 2021-03-25 DIAGNOSIS — J9611 Chronic respiratory failure with hypoxia: Secondary | ICD-10-CM | POA: Diagnosis not present

## 2021-03-25 DIAGNOSIS — E1151 Type 2 diabetes mellitus with diabetic peripheral angiopathy without gangrene: Secondary | ICD-10-CM | POA: Diagnosis not present

## 2021-03-25 DIAGNOSIS — Z794 Long term (current) use of insulin: Secondary | ICD-10-CM | POA: Diagnosis not present

## 2021-03-25 DIAGNOSIS — L97328 Non-pressure chronic ulcer of left ankle with other specified severity: Secondary | ICD-10-CM | POA: Diagnosis not present

## 2021-03-25 DIAGNOSIS — J449 Chronic obstructive pulmonary disease, unspecified: Secondary | ICD-10-CM | POA: Diagnosis not present

## 2021-03-25 DIAGNOSIS — R2681 Unsteadiness on feet: Secondary | ICD-10-CM | POA: Diagnosis not present

## 2021-03-25 DIAGNOSIS — R531 Weakness: Secondary | ICD-10-CM | POA: Diagnosis not present

## 2021-03-25 DIAGNOSIS — E11622 Type 2 diabetes mellitus with other skin ulcer: Secondary | ICD-10-CM | POA: Diagnosis not present

## 2021-03-27 DIAGNOSIS — E1151 Type 2 diabetes mellitus with diabetic peripheral angiopathy without gangrene: Secondary | ICD-10-CM | POA: Diagnosis not present

## 2021-03-27 DIAGNOSIS — I739 Peripheral vascular disease, unspecified: Secondary | ICD-10-CM | POA: Diagnosis not present

## 2021-03-27 DIAGNOSIS — L97328 Non-pressure chronic ulcer of left ankle with other specified severity: Secondary | ICD-10-CM | POA: Diagnosis not present

## 2021-03-27 DIAGNOSIS — E11622 Type 2 diabetes mellitus with other skin ulcer: Secondary | ICD-10-CM | POA: Diagnosis not present

## 2021-03-27 DIAGNOSIS — R531 Weakness: Secondary | ICD-10-CM | POA: Diagnosis not present

## 2021-03-27 DIAGNOSIS — Z794 Long term (current) use of insulin: Secondary | ICD-10-CM | POA: Diagnosis not present

## 2021-03-27 DIAGNOSIS — R2681 Unsteadiness on feet: Secondary | ICD-10-CM | POA: Diagnosis not present

## 2021-03-27 DIAGNOSIS — J9611 Chronic respiratory failure with hypoxia: Secondary | ICD-10-CM | POA: Diagnosis not present

## 2021-03-27 DIAGNOSIS — J449 Chronic obstructive pulmonary disease, unspecified: Secondary | ICD-10-CM | POA: Diagnosis not present

## 2021-03-28 DIAGNOSIS — L97328 Non-pressure chronic ulcer of left ankle with other specified severity: Secondary | ICD-10-CM | POA: Diagnosis not present

## 2021-03-28 DIAGNOSIS — E11622 Type 2 diabetes mellitus with other skin ulcer: Secondary | ICD-10-CM | POA: Diagnosis not present

## 2021-03-29 DIAGNOSIS — J439 Emphysema, unspecified: Secondary | ICD-10-CM | POA: Diagnosis not present

## 2021-04-03 DIAGNOSIS — L97328 Non-pressure chronic ulcer of left ankle with other specified severity: Secondary | ICD-10-CM | POA: Diagnosis not present

## 2021-04-03 DIAGNOSIS — E11622 Type 2 diabetes mellitus with other skin ulcer: Secondary | ICD-10-CM | POA: Diagnosis not present

## 2021-04-03 DIAGNOSIS — R531 Weakness: Secondary | ICD-10-CM | POA: Diagnosis not present

## 2021-04-03 DIAGNOSIS — I739 Peripheral vascular disease, unspecified: Secondary | ICD-10-CM | POA: Diagnosis not present

## 2021-04-03 DIAGNOSIS — Z794 Long term (current) use of insulin: Secondary | ICD-10-CM | POA: Diagnosis not present

## 2021-04-03 DIAGNOSIS — R2681 Unsteadiness on feet: Secondary | ICD-10-CM | POA: Diagnosis not present

## 2021-04-03 DIAGNOSIS — E1151 Type 2 diabetes mellitus with diabetic peripheral angiopathy without gangrene: Secondary | ICD-10-CM | POA: Diagnosis not present

## 2021-04-03 DIAGNOSIS — J449 Chronic obstructive pulmonary disease, unspecified: Secondary | ICD-10-CM | POA: Diagnosis not present

## 2021-04-03 DIAGNOSIS — J9611 Chronic respiratory failure with hypoxia: Secondary | ICD-10-CM | POA: Diagnosis not present

## 2021-04-07 DIAGNOSIS — E11622 Type 2 diabetes mellitus with other skin ulcer: Secondary | ICD-10-CM | POA: Diagnosis not present

## 2021-04-07 DIAGNOSIS — E1151 Type 2 diabetes mellitus with diabetic peripheral angiopathy without gangrene: Secondary | ICD-10-CM | POA: Diagnosis not present

## 2021-04-07 DIAGNOSIS — J9611 Chronic respiratory failure with hypoxia: Secondary | ICD-10-CM | POA: Diagnosis not present

## 2021-04-07 DIAGNOSIS — R531 Weakness: Secondary | ICD-10-CM | POA: Diagnosis not present

## 2021-04-07 DIAGNOSIS — J449 Chronic obstructive pulmonary disease, unspecified: Secondary | ICD-10-CM | POA: Diagnosis not present

## 2021-04-07 DIAGNOSIS — R2681 Unsteadiness on feet: Secondary | ICD-10-CM | POA: Diagnosis not present

## 2021-04-07 DIAGNOSIS — L97328 Non-pressure chronic ulcer of left ankle with other specified severity: Secondary | ICD-10-CM | POA: Diagnosis not present

## 2021-04-07 DIAGNOSIS — I739 Peripheral vascular disease, unspecified: Secondary | ICD-10-CM | POA: Diagnosis not present

## 2021-04-07 DIAGNOSIS — Z794 Long term (current) use of insulin: Secondary | ICD-10-CM | POA: Diagnosis not present

## 2021-04-08 NOTE — Progress Notes (Signed)
Crystal Moore, Crystal Moore ([id number]) Visit Report for 02/25/2021 Allergy List Details Patient Name: Date of Service: Crystal Moore, Crystal Moore 02/25/2021 2:45 PM Medical Record Number: [id number] Patient Account Number: [id number] Date of Birth/Sex: Treating RN: 09-06-1945 (75 y.o. Crystal Moore Primary Care Crystal Moore: Crystal Moore Other Clinician: Referring Crystal Moore: Treating Kendon Sedeno/Extender: [name] Weeks in Treatment: 0 Allergies Active Allergies No Known Drug Allergies Allergy Notes Electronic Signature(s) Signed: 02/25/2021 5:31:21 PM By: Baruch Gouty RN, BSN Entered By: Baruch Gouty on 02/25/2021 15:44:44 -------------------------------------------------------------------------------- Arrival Information Details Patient Name: Date of Service: Crystal Moore. 02/25/2021 2:45 PM Medical Record Number: [id number] Patient Account Number: [id number] Date of Birth/Sex: Treating RN: 19-Oct-1945 (75 y.o. Crystal Moore, Crystal Moore Primary Care Crystal Moore: Crystal Moore Other Clinician: Referring Talyn Eddie: Treating Valdemar Mcclenahan/Extender: [name] in Treatment: 0 Visit Information Patient Arrived: Wheel Chair Arrival Time: 15:36 Accompanied By: Daughter Transfer Assistance: EasyPivot Patient Lift Patient Identification Verified: Yes Secondary Verification Process Completed: Yes Patient Requires Transmission-Based Precautions: No Patient Has Alerts: Yes Patient Alerts: R ABI=1.06 TBI=.67 L ABI=.96 TBI=.66 History Since Last Visit Added or deleted any medications: No Any new allergies or adverse reactions: No Had a fall or experienced change in activities of daily living that may affect risk of falls: No Signs or symptoms of abuse/neglect since last visito No Hospitalized since last visit: Yes Implantable device outside of the clinic excluding cellular tissue based products placed in the center since last visit:  No Has Dressing in Place as Prescribed: Yes Electronic Signature(s) Signed: 02/25/2021 4:57:34 PM By: Lorrin Jackson Entered By: Lorrin Jackson on 02/25/2021 16:57:34 -------------------------------------------------------------------------------- Clinic Level of Care Assessment Details Patient Name: Date of Service: Crystal Moore 02/25/2021 2:45 PM Medical Record Number: [id number] Patient Account Number: [id number] Date of Birth/Sex: Treating RN: 20-Sep-1945 (75 y.o. Crystal Moore, Crystal Moore Primary Care Crystal Moore Welcome: Crystal Moore Other Clinician: Referring Crystal Moore: Treating Roman Dubuc/Extender: [name] Weeks in Treatment: 0 Clinic Level of Care Assessment Items TOOL 4 Quantity Score X- 1 0 Use when only an EandM is performed on FOLLOW-UP visit ASSESSMENTS - Nursing Assessment / Reassessment X- 1 10 Reassessment of Co-morbidities (includes updates in patient status) X- 1 5 Reassessment of Adherence to Treatment Plan ASSESSMENTS - Wound and Skin A ssessment / Reassessment X - Simple Wound Assessment / Reassessment - one wound 1 5 []  - 0 Complex Wound Assessment / Reassessment - multiple wounds X- 1 10 Dermatologic / Skin Assessment (not related to wound area) ASSESSMENTS - Focused Assessment X- 1 5 Circumferential Edema Measurements - multi extremities []  - 0 Nutritional Assessment / Counseling / Intervention []  - 0 Lower Extremity Assessment (monofilament, tuning fork, pulses) []  - 0 Peripheral Arterial Disease Assessment (using hand held doppler) ASSESSMENTS - Ostomy and/or Continence Assessment and Care []  - 0 Incontinence Assessment and Management []  - 0 Ostomy Care Assessment and Management (repouching, etc.) PROCESS - Coordination of Care X - Simple Patient / Family Education for ongoing care 1 15 []  - 0 Complex (extensive) Patient / Family Education for ongoing care X- 1 10 Staff obtains Programmer, systems, Records, T Results / Process  Orders est X- 1 10 Staff telephones HHA, Nursing Homes / Clarify orders / etc []  - 0 Routine Transfer to another Facility (non-emergent condition) []  - 0 Routine Hospital Admission (non-emergent condition) X- 1 15 New Admissions / Biomedical engineer / Ordering NPWT Apligraf, etc. , []  - 0 Emergency Hospital Admission (emergent condition) X- 1 10 Simple Discharge Coordination []  - 0  Complex (extensive) Discharge Coordination PROCESS - Special Needs []  - 0 Pediatric / Minor Patient Management []  - 0 Isolation Patient Management []  - 0 Hearing / Language / Visual special needs []  - 0 Assessment of Community assistance (transportation, D/C planning, etc.) []  - 0 Additional assistance / Altered mentation []  - 0 Support Surface(s) Assessment (bed, cushion, seat, etc.) INTERVENTIONS - Wound Cleansing / Measurement X- 1 5 Simple Wound Cleansing - one wound []  - 0 Complex Wound Cleansing - multiple wounds X- 1 5 Wound Imaging (photographs - any number of wounds) []  - 0 Wound Tracing (instead of photographs) X- 1 5 Simple Wound Measurement - one wound []  - 0 Complex Wound Measurement - multiple wounds INTERVENTIONS - Wound Dressings X - Small Wound Dressing one or multiple wounds 1 10 []  - 0 Medium Wound Dressing one or multiple wounds []  - 0 Large Wound Dressing one or multiple wounds X- 1 5 Application of Medications - topical []  - 0 Application of Medications - injection INTERVENTIONS - Miscellaneous []  - 0 External ear exam []  - 0 Specimen Collection (cultures, biopsies, blood, body fluids, etc.) []  - 0 Specimen(s) / Culture(s) sent or taken to Lab for analysis []  - 0 Patient Transfer (multiple staff / Civil Service fast streamer / Similar devices) []  - 0 Simple Staple / Suture removal (25 or less) []  - 0 Complex Staple / Suture removal (26 or more) []  - 0 Hypo / Hyperglycemic Management (close monitor of Blood Glucose) []  - 0 Ankle / Brachial Index (ABI) - do not  check if billed separately X- 1 5 Vital Signs Has the patient been seen at the hospital within the last three years: Yes Total Score: 130 Level Of Care: New/Established - Level 4 Electronic Signature(s) Signed: 04/08/2021 8:00:33 AM By: Rhae Hammock RN Entered By: Rhae Moore on 02/25/2021 16:35:17 -------------------------------------------------------------------------------- Encounter Discharge Information Details Patient Name: Date of Service: Crystal Moore. 02/25/2021 2:45 PM Medical Record Number: [id number] Patient Account Number: [id number] Date of Birth/Sex: Treating RN: Mar 06, 1946 (75 y.o. [name] Primary Care Kelci Petrella: Crystal Moore Other Clinician: Referring Bowman Higbie: Treating Phiona Ramnauth/Extender: [name] Weeks in Treatment: 0 Encounter Discharge Information Items Discharge Condition: Stable Ambulatory Status: Wheelchair Discharge Destination: Home Transportation: Private Auto Accompanied By: Daughter Schedule Follow-up Appointment: Yes Clinical Summary of Care: Provided on 02/25/2021 Form Type Recipient Paper Patient Patient Electronic Signature(s) Signed: 02/25/2021 4:55:56 PM By: Lorrin Jackson Entered By: Lorrin Jackson on 02/25/2021 16:55:56 -------------------------------------------------------------------------------- Lower Extremity Assessment Details Patient Name: Date of Service: Crystal Moore. 02/25/2021 2:45 PM Medical Record Number: [id number] Patient Account Number: [id number] Date of Birth/Sex: Treating RN: Dec 15, 1945 (75 y.o. Crystal Moore Primary Care Maisley Hainsworth: Crystal Moore Other Clinician: Referring Ilynn Stauffer: Treating Jacara Benito/Extender: [name] Weeks in Treatment: 0 Edema Assessment Assessed: [Left: No] [Right: No] Edema: [Left: Ye] [Right: s] Calf Left: Right: Point of Measurement: From Medial Instep 32.5 cm Ankle Left:  Right: Point of Measurement: From Medial Instep 21 cm Vascular Assessment Pulses: Dorsalis Pedis Palpable: [Left:Yes] Electronic Signature(s) Signed: 02/25/2021 5:31:21 PM By: Baruch Gouty RN, BSN Entered By: Baruch Gouty on 02/25/2021 15:54:08 -------------------------------------------------------------------------------- Multi Wound Chart Details Patient Name: Date of Service: Crystal Moore. 02/25/2021 2:45 PM Medical Record Number: [id number] Patient Account Number: [id number] Date of Birth/Sex: Treating RN: Sep 12, 1945 (75 y.o. Crystal Moore, Crystal Moore Primary Care Jovanne Riggenbach: Crystal Moore Other Clinician: Referring Raquel Racey: Treating Talon Witting/Extender: [name] Weeks in Treatment: 0 Vital Signs Height(in): 61 Capillary Blood Glucose(mg/dl): 206  Weight(lbs): 128 Pulse(bpm): 101 Body Mass Index(BMI): 24 Blood Pressure(mmHg): 183/77 Temperature(F): 97.5 Respiratory Rate(breaths/min): 18 Photos: [4:Left, Lateral Ankle] [N/A:N/A N/A] Wound Location: [4:Gradually Appeared] [N/A:N/A] Wounding Event: [4:Diabetic Wound/Ulcer of the Lower] [N/A:N/A] Primary Etiology: [4:Extremity Venous Leg Ulcer] [N/A:N/A] Secondary Etiology: [4:Cataracts, Chronic Obstructive] [N/A:N/A] Comorbid History: [4:Pulmonary Disease (COPD), Hypertension, Type II Diabetes, Osteoarthritis, Seizure Disorder 02/25/2020] [N/A:N/A] Date Acquired: [4:0] [N/A:N/A] Weeks of Treatment: [4:Open] [N/A:N/A] Wound Status: [4:2.3x2.2x0.8] [N/A:N/A] Measurements L x W x D (cm) [4:3.974] [N/A:N/A] A (cm) : rea [4:3.179] [N/A:N/A] Volume (cm) : [4:0.00%] [N/A:N/A] % Reduction in A rea: [4:0.00%] [N/A:N/A] % Reduction in Volume: [4:Grade 2] [N/A:N/A] Classification: [4:Medium] [N/A:N/A] Exudate A mount: [4:Serosanguineous] [N/A:N/A] Exudate Type: [4:red, brown] [N/A:N/A] Exudate Color: [4:Distinct, outline attached] [N/A:N/A] Wound Margin: [4:Medium (34-66%)]  [N/A:N/A] Granulation A mount: [4:Pink] [N/A:N/A] Granulation Quality: [4:Medium (34-66%)] [N/A:N/A] Necrotic A mount: [4:Fat Layer (Subcutaneous Tissue): Yes N/A] Exposed Structures: [4:Tendon: Yes Fascia: No Muscle: No Joint: No Bone: No None] [N/A:N/A] Treatment Notes Wound #4 (Ankle) Wound Laterality: Left, Lateral Cleanser Soap and Water Discharge Instruction: May shower and wash wound with dial antibacterial soap and water prior to dressing change. Wound Cleanser Discharge Instruction: Cleanse the wound with wound cleanser prior to applying a clean dressing using gauze sponges, not tissue or cotton balls. Peri-Wound Care Topical Primary Dressing KerraCel Ag Gelling Fiber Dressing, 4x5 in (silver alginate) Discharge Instruction: Apply silver alginate to wound bed as instructed Secondary Dressing Woven Gauze Sponge, Non-Sterile 4x4 in Discharge Instruction: Apply over primary dressing as directed. ABD Pad, 5x9 Discharge Instruction: Apply over primary dressing as directed. Optifoam Non-Adhesive Dressing, 4x4 in Discharge Instruction: Apply foam donut for offloading Secured With Kerlix Roll Sterile, 4.5x3.1 (in/yd) Discharge Instruction: Secure with Kerlix as directed. 49M Medipore H Soft Cloth Surgical T 4 x 2 (in/yd) ape Discharge Instruction: Secure dressing with tape as directed. Compression Wrap Compression Stockings Add-Ons Electronic Signature(s) Signed: 02/25/2021 5:30:23 PM By: Kalman Shan DO Signed: 04/08/2021 8:00:33 AM By: Rhae Hammock RN Entered By: Kalman Shan on 02/25/2021 17:19:10 -------------------------------------------------------------------------------- Multi-Disciplinary Care Plan Details Patient Name: Date of Service: Crystal Moore. 02/25/2021 2:45 PM Medical Record Number: [id number] Patient Account Number: [id number] Date of Birth/Sex: Treating RN: Aug 08, 1945 (75 y.o. Crystal Moore, Crystal Moore Primary Care Dreonna Hussein: Crystal Moore Other Clinician: Referring Leya Paige: Treating Squire Withey/Extender: [name] Weeks in Treatment: 0 Active Inactive Orientation to the Wound Care Program Nursing Diagnoses: Knowledge deficit related to the wound healing center program Goals: Patient/caregiver will verbalize understanding of the [place] Program Date Initiated: 02/25/2021 Target Resolution Date: 04/02/2021 Goal Status: Active Interventions: Provide education on orientation to the wound center Notes: Wound/Skin Impairment Nursing Diagnoses: Impaired tissue integrity Knowledge deficit related to ulceration/compromised skin integrity Goals: Patient will have a decrease in wound volume by X% from date: (specify in notes) Date Initiated: 02/25/2021 Target Resolution Date: 04/04/2021 Goal Status: Active Patient/caregiver will verbalize understanding of skin care regimen Date Initiated: 02/25/2021 Target Resolution Date: 04/05/2021 Goal Status: Active Ulcer/skin breakdown will have a volume reduction of 30% by week 4 Date Initiated: 02/25/2021 Target Resolution Date: 04/04/2021 Goal Status: Active Interventions: Assess patient/caregiver ability to obtain necessary supplies Assess patient/caregiver ability to perform ulcer/skin care regimen upon admission and as needed Assess ulceration(s) every visit Notes: Electronic Signature(s) Signed: 04/08/2021 8:00:33 AM By: Rhae Hammock RN Entered By: Rhae Moore on 02/25/2021 16:18:41 -------------------------------------------------------------------------------- Pain Assessment Details Patient Name: Date of Service: Crystal Moore. 02/25/2021 2:45 PM Medical Record Number: [id number] Patient Account Number:  [id number] Date of Birth/Sex: Treating RN: 1945-09-08 (75 y.o. Crystal Moore Primary Care Crit Obremski: Crystal Moore Other Clinician: Referring Kareen Jefferys: Treating Jacynda Brunke/Extender: [name] Weeks in Treatment: 0 Active Problems Location of Pain Severity and Description of Pain Patient Has Paino Yes Site Locations Pain Location: Pain in Ulcers With Dressing Change: Yes Duration of the Pain. Constant / Intermittento Constant Rate the pain. Current Pain Level: 6 Worst Pain Level: 10 Least Pain Level: 2 Character of Pain Describe the Pain: Aching Pain Management and Medication Current Pain Management: Medication: Yes Is the Current Pain Management Adequate: Adequate How does your wound impact your activities of daily livingo Sleep: Yes Electronic Signature(s) Signed: 02/25/2021 5:31:21 PM By: Baruch Gouty RN, BSN Entered By: Baruch Gouty on 02/25/2021 16:01:46 -------------------------------------------------------------------------------- Patient/Caregiver Education Details Patient Name: Date of Service: Crystal Moore 8/2/2022andnbsp2:45 PM Medical Record Number: [id number] Patient Account Number: [id number] Date of Birth/Gender: Treating RN: Jun 02, 1946 (75 y.o. Benjaman Lobe Primary Care Physician: Crystal Moore Other Clinician: Referring Physician: Treating Physician/Extender: [name] in Treatment: 0 Education Assessment Education Provided To: Patient and Caregiver Education Topics Provided [place]: o Methods: Explain/Verbal Responses: State content correctly Electronic Signature(s) Signed: 04/08/2021 8:00:33 AM By: Rhae Hammock RN Entered By: Rhae Moore on 02/25/2021 16:19:25 -------------------------------------------------------------------------------- Wound Assessment Details Patient Name: Date of Service: Crystal Moore. 02/25/2021 2:45 PM Medical Record Number: [id number] Patient Account Number: [id number] Date of Birth/Sex: Treating RN: [birth year] (75 y.o. Crystal Moore Primary Care Denetria Luevanos: Crystal Moore Other Clinician: Referring Dotty Gonzalo: Treating Tiffeny Minchew/Extender: [name] Weeks in Treatment: 0 Wound Status Wound Number: 4 Primary Diabetic Wound/Ulcer of the Lower Extremity Etiology: Wound Location: Left, Lateral Ankle Secondary Venous Leg Ulcer Wounding Event: Gradually Appeared Etiology: Date Acquired: 02/25/2020 Wound Open Weeks Of Treatment: 0 Status: Clustered Wound: No Comorbid Cataracts, Chronic Obstructive Pulmonary Disease (COPD), History: Hypertension, Type II Diabetes, Osteoarthritis, Seizure Disorder Photos Wound Measurements Length: (cm) 2.3 Width: (cm) 2.2 Depth: (cm) 0.8 Area: (cm) 3.974 Volume: (cm) 3.179 % Reduction in Area: 0% % Reduction in Volume: 0% Epithelialization: None Tunneling: No Undermining: No Wound Description Classification: Grade 2 Wound Margin: Distinct, outline attached Exudate Amount: Medium Exudate Type: Serosanguineous Exudate Color: red, brown Foul Odor After Cleansing: No Slough/Fibrino Yes Wound Bed Granulation Amount: Medium (34-66%) Exposed Structure Granulation Quality: Pink Fascia Exposed: No Necrotic Amount: Medium (34-66%) Fat Layer (Subcutaneous Tissue) Exposed: Yes Necrotic Quality: Adherent Slough Tendon Exposed: Yes Muscle Exposed: No Joint Exposed: No Bone Exposed: No Electronic Signature(s) Signed: 02/25/2021 5:31:21 PM By: Baruch Gouty RN, BSN Entered By: Baruch Gouty on 02/25/2021 15:59:38 -------------------------------------------------------------------------------- [place] Details Patient Name: Date of Service: Crystal Moore. 02/25/2021 2:45 PM Medical Record Number: [id number] Patient Account Number: [id number] Date of Birth/Sex: Treating RN: 30-Oct-1945 (75 y.o. Crystal Moore Primary Care Ramonte Mena: Crystal Moore Other Clinician: Referring Endre Coutts: Treating Dail Meece/Extender: [name] Weeks in  Treatment: 0 Vital Signs Time Taken: 15:45 Temperature (F): 97.5 Height (in): 61 Pulse (bpm): 101 Source: Stated Respiratory Rate (breaths/min): 18 Weight (lbs): 128 Blood Pressure (mmHg): 183/77 Source: Stated Capillary Blood Glucose (mg/dl): 206 Body Mass Index (BMI): 24.2 Reference Range: 80 - 120 mg / dl Notes glucose per daughter report yesterday Electronic Signature(s) Signed: 02/25/2021 5:31:21 PM By: Baruch Gouty RN, BSN Entered By: Baruch Gouty on 02/25/2021 15:44:28

## 2021-04-10 DIAGNOSIS — Z794 Long term (current) use of insulin: Secondary | ICD-10-CM | POA: Diagnosis not present

## 2021-04-10 DIAGNOSIS — L97328 Non-pressure chronic ulcer of left ankle with other specified severity: Secondary | ICD-10-CM | POA: Diagnosis not present

## 2021-04-10 DIAGNOSIS — J9611 Chronic respiratory failure with hypoxia: Secondary | ICD-10-CM | POA: Diagnosis not present

## 2021-04-10 DIAGNOSIS — R531 Weakness: Secondary | ICD-10-CM | POA: Diagnosis not present

## 2021-04-10 DIAGNOSIS — R2681 Unsteadiness on feet: Secondary | ICD-10-CM | POA: Diagnosis not present

## 2021-04-10 DIAGNOSIS — I739 Peripheral vascular disease, unspecified: Secondary | ICD-10-CM | POA: Diagnosis not present

## 2021-04-10 DIAGNOSIS — J449 Chronic obstructive pulmonary disease, unspecified: Secondary | ICD-10-CM | POA: Diagnosis not present

## 2021-04-10 DIAGNOSIS — E11622 Type 2 diabetes mellitus with other skin ulcer: Secondary | ICD-10-CM | POA: Diagnosis not present

## 2021-04-10 DIAGNOSIS — E1151 Type 2 diabetes mellitus with diabetic peripheral angiopathy without gangrene: Secondary | ICD-10-CM | POA: Diagnosis not present

## 2021-04-15 DIAGNOSIS — Z794 Long term (current) use of insulin: Secondary | ICD-10-CM | POA: Diagnosis not present

## 2021-04-15 DIAGNOSIS — R531 Weakness: Secondary | ICD-10-CM | POA: Diagnosis not present

## 2021-04-15 DIAGNOSIS — L97328 Non-pressure chronic ulcer of left ankle with other specified severity: Secondary | ICD-10-CM | POA: Diagnosis not present

## 2021-04-15 DIAGNOSIS — J449 Chronic obstructive pulmonary disease, unspecified: Secondary | ICD-10-CM | POA: Diagnosis not present

## 2021-04-15 DIAGNOSIS — E1151 Type 2 diabetes mellitus with diabetic peripheral angiopathy without gangrene: Secondary | ICD-10-CM | POA: Diagnosis not present

## 2021-04-15 DIAGNOSIS — E11622 Type 2 diabetes mellitus with other skin ulcer: Secondary | ICD-10-CM | POA: Diagnosis not present

## 2021-04-15 DIAGNOSIS — I739 Peripheral vascular disease, unspecified: Secondary | ICD-10-CM | POA: Diagnosis not present

## 2021-04-15 DIAGNOSIS — R2681 Unsteadiness on feet: Secondary | ICD-10-CM | POA: Diagnosis not present

## 2021-04-15 DIAGNOSIS — J9611 Chronic respiratory failure with hypoxia: Secondary | ICD-10-CM | POA: Diagnosis not present

## 2021-04-16 DIAGNOSIS — E11622 Type 2 diabetes mellitus with other skin ulcer: Secondary | ICD-10-CM | POA: Diagnosis not present

## 2021-04-16 DIAGNOSIS — L97328 Non-pressure chronic ulcer of left ankle with other specified severity: Secondary | ICD-10-CM | POA: Diagnosis not present

## 2021-04-18 DIAGNOSIS — R531 Weakness: Secondary | ICD-10-CM | POA: Diagnosis not present

## 2021-04-18 DIAGNOSIS — E1151 Type 2 diabetes mellitus with diabetic peripheral angiopathy without gangrene: Secondary | ICD-10-CM | POA: Diagnosis not present

## 2021-04-18 DIAGNOSIS — L97328 Non-pressure chronic ulcer of left ankle with other specified severity: Secondary | ICD-10-CM | POA: Diagnosis not present

## 2021-04-18 DIAGNOSIS — E11622 Type 2 diabetes mellitus with other skin ulcer: Secondary | ICD-10-CM | POA: Diagnosis not present

## 2021-04-18 DIAGNOSIS — J9611 Chronic respiratory failure with hypoxia: Secondary | ICD-10-CM | POA: Diagnosis not present

## 2021-04-18 DIAGNOSIS — I739 Peripheral vascular disease, unspecified: Secondary | ICD-10-CM | POA: Diagnosis not present

## 2021-04-18 DIAGNOSIS — R2681 Unsteadiness on feet: Secondary | ICD-10-CM | POA: Diagnosis not present

## 2021-04-18 DIAGNOSIS — Z794 Long term (current) use of insulin: Secondary | ICD-10-CM | POA: Diagnosis not present

## 2021-04-18 DIAGNOSIS — J449 Chronic obstructive pulmonary disease, unspecified: Secondary | ICD-10-CM | POA: Diagnosis not present

## 2021-04-22 DIAGNOSIS — I739 Peripheral vascular disease, unspecified: Secondary | ICD-10-CM | POA: Diagnosis not present

## 2021-04-22 DIAGNOSIS — R531 Weakness: Secondary | ICD-10-CM | POA: Diagnosis not present

## 2021-04-22 DIAGNOSIS — J9611 Chronic respiratory failure with hypoxia: Secondary | ICD-10-CM | POA: Diagnosis not present

## 2021-04-22 DIAGNOSIS — Z794 Long term (current) use of insulin: Secondary | ICD-10-CM | POA: Diagnosis not present

## 2021-04-22 DIAGNOSIS — L97328 Non-pressure chronic ulcer of left ankle with other specified severity: Secondary | ICD-10-CM | POA: Diagnosis not present

## 2021-04-22 DIAGNOSIS — R2681 Unsteadiness on feet: Secondary | ICD-10-CM | POA: Diagnosis not present

## 2021-04-22 DIAGNOSIS — E1151 Type 2 diabetes mellitus with diabetic peripheral angiopathy without gangrene: Secondary | ICD-10-CM | POA: Diagnosis not present

## 2021-04-22 DIAGNOSIS — E11622 Type 2 diabetes mellitus with other skin ulcer: Secondary | ICD-10-CM | POA: Diagnosis not present

## 2021-04-22 DIAGNOSIS — J449 Chronic obstructive pulmonary disease, unspecified: Secondary | ICD-10-CM | POA: Diagnosis not present

## 2021-04-24 DIAGNOSIS — R531 Weakness: Secondary | ICD-10-CM | POA: Diagnosis not present

## 2021-04-24 DIAGNOSIS — E1165 Type 2 diabetes mellitus with hyperglycemia: Secondary | ICD-10-CM | POA: Diagnosis not present

## 2021-04-24 DIAGNOSIS — I739 Peripheral vascular disease, unspecified: Secondary | ICD-10-CM | POA: Diagnosis not present

## 2021-04-24 DIAGNOSIS — Z794 Long term (current) use of insulin: Secondary | ICD-10-CM | POA: Diagnosis not present

## 2021-04-24 DIAGNOSIS — D500 Iron deficiency anemia secondary to blood loss (chronic): Secondary | ICD-10-CM | POA: Diagnosis not present

## 2021-04-24 DIAGNOSIS — R2681 Unsteadiness on feet: Secondary | ICD-10-CM | POA: Diagnosis not present

## 2021-04-24 DIAGNOSIS — E1139 Type 2 diabetes mellitus with other diabetic ophthalmic complication: Secondary | ICD-10-CM | POA: Diagnosis not present

## 2021-04-24 DIAGNOSIS — J9611 Chronic respiratory failure with hypoxia: Secondary | ICD-10-CM | POA: Diagnosis not present

## 2021-04-24 DIAGNOSIS — E11622 Type 2 diabetes mellitus with other skin ulcer: Secondary | ICD-10-CM | POA: Diagnosis not present

## 2021-04-24 DIAGNOSIS — J449 Chronic obstructive pulmonary disease, unspecified: Secondary | ICD-10-CM | POA: Diagnosis not present

## 2021-04-24 DIAGNOSIS — L97328 Non-pressure chronic ulcer of left ankle with other specified severity: Secondary | ICD-10-CM | POA: Diagnosis not present

## 2021-04-24 DIAGNOSIS — E11621 Type 2 diabetes mellitus with foot ulcer: Secondary | ICD-10-CM | POA: Diagnosis not present

## 2021-04-24 DIAGNOSIS — I10 Essential (primary) hypertension: Secondary | ICD-10-CM | POA: Diagnosis not present

## 2021-04-24 DIAGNOSIS — E1151 Type 2 diabetes mellitus with diabetic peripheral angiopathy without gangrene: Secondary | ICD-10-CM | POA: Diagnosis not present

## 2021-04-28 DIAGNOSIS — J439 Emphysema, unspecified: Secondary | ICD-10-CM | POA: Diagnosis not present

## 2021-04-28 DIAGNOSIS — I6350 Cerebral infarction due to unspecified occlusion or stenosis of unspecified cerebral artery: Secondary | ICD-10-CM | POA: Diagnosis not present

## 2021-04-28 DIAGNOSIS — R531 Weakness: Secondary | ICD-10-CM | POA: Diagnosis not present

## 2021-04-29 DIAGNOSIS — R531 Weakness: Secondary | ICD-10-CM | POA: Diagnosis not present

## 2021-04-29 DIAGNOSIS — R2681 Unsteadiness on feet: Secondary | ICD-10-CM | POA: Diagnosis not present

## 2021-04-29 DIAGNOSIS — J9611 Chronic respiratory failure with hypoxia: Secondary | ICD-10-CM | POA: Diagnosis not present

## 2021-04-29 DIAGNOSIS — L97328 Non-pressure chronic ulcer of left ankle with other specified severity: Secondary | ICD-10-CM | POA: Diagnosis not present

## 2021-04-29 DIAGNOSIS — I739 Peripheral vascular disease, unspecified: Secondary | ICD-10-CM | POA: Diagnosis not present

## 2021-04-29 DIAGNOSIS — E11622 Type 2 diabetes mellitus with other skin ulcer: Secondary | ICD-10-CM | POA: Diagnosis not present

## 2021-04-29 DIAGNOSIS — J449 Chronic obstructive pulmonary disease, unspecified: Secondary | ICD-10-CM | POA: Diagnosis not present

## 2021-04-29 DIAGNOSIS — Z794 Long term (current) use of insulin: Secondary | ICD-10-CM | POA: Diagnosis not present

## 2021-04-29 DIAGNOSIS — E1151 Type 2 diabetes mellitus with diabetic peripheral angiopathy without gangrene: Secondary | ICD-10-CM | POA: Diagnosis not present

## 2021-05-01 ENCOUNTER — Encounter (HOSPITAL_BASED_OUTPATIENT_CLINIC_OR_DEPARTMENT_OTHER): Payer: Medicare Other | Attending: Internal Medicine | Admitting: Internal Medicine

## 2021-05-01 DIAGNOSIS — E1151 Type 2 diabetes mellitus with diabetic peripheral angiopathy without gangrene: Secondary | ICD-10-CM | POA: Diagnosis not present

## 2021-05-01 DIAGNOSIS — I739 Peripheral vascular disease, unspecified: Secondary | ICD-10-CM | POA: Diagnosis not present

## 2021-05-01 DIAGNOSIS — Z794 Long term (current) use of insulin: Secondary | ICD-10-CM | POA: Diagnosis not present

## 2021-05-01 DIAGNOSIS — R531 Weakness: Secondary | ICD-10-CM | POA: Diagnosis not present

## 2021-05-01 DIAGNOSIS — J449 Chronic obstructive pulmonary disease, unspecified: Secondary | ICD-10-CM | POA: Diagnosis not present

## 2021-05-01 DIAGNOSIS — R2681 Unsteadiness on feet: Secondary | ICD-10-CM | POA: Diagnosis not present

## 2021-05-01 DIAGNOSIS — E11622 Type 2 diabetes mellitus with other skin ulcer: Secondary | ICD-10-CM | POA: Diagnosis not present

## 2021-05-01 DIAGNOSIS — L97328 Non-pressure chronic ulcer of left ankle with other specified severity: Secondary | ICD-10-CM | POA: Diagnosis not present

## 2021-05-01 DIAGNOSIS — J9611 Chronic respiratory failure with hypoxia: Secondary | ICD-10-CM | POA: Diagnosis not present

## 2021-05-08 DIAGNOSIS — R531 Weakness: Secondary | ICD-10-CM | POA: Diagnosis not present

## 2021-05-08 DIAGNOSIS — L97328 Non-pressure chronic ulcer of left ankle with other specified severity: Secondary | ICD-10-CM | POA: Diagnosis not present

## 2021-05-08 DIAGNOSIS — I739 Peripheral vascular disease, unspecified: Secondary | ICD-10-CM | POA: Diagnosis not present

## 2021-05-08 DIAGNOSIS — J449 Chronic obstructive pulmonary disease, unspecified: Secondary | ICD-10-CM | POA: Diagnosis not present

## 2021-05-08 DIAGNOSIS — R2681 Unsteadiness on feet: Secondary | ICD-10-CM | POA: Diagnosis not present

## 2021-05-08 DIAGNOSIS — E1151 Type 2 diabetes mellitus with diabetic peripheral angiopathy without gangrene: Secondary | ICD-10-CM | POA: Diagnosis not present

## 2021-05-08 DIAGNOSIS — E11622 Type 2 diabetes mellitus with other skin ulcer: Secondary | ICD-10-CM | POA: Diagnosis not present

## 2021-05-08 DIAGNOSIS — J9611 Chronic respiratory failure with hypoxia: Secondary | ICD-10-CM | POA: Diagnosis not present

## 2021-05-08 DIAGNOSIS — Z794 Long term (current) use of insulin: Secondary | ICD-10-CM | POA: Diagnosis not present

## 2021-05-12 DIAGNOSIS — I739 Peripheral vascular disease, unspecified: Secondary | ICD-10-CM | POA: Diagnosis not present

## 2021-05-12 DIAGNOSIS — L97328 Non-pressure chronic ulcer of left ankle with other specified severity: Secondary | ICD-10-CM | POA: Diagnosis not present

## 2021-05-12 DIAGNOSIS — R531 Weakness: Secondary | ICD-10-CM | POA: Diagnosis not present

## 2021-05-12 DIAGNOSIS — E1151 Type 2 diabetes mellitus with diabetic peripheral angiopathy without gangrene: Secondary | ICD-10-CM | POA: Diagnosis not present

## 2021-05-12 DIAGNOSIS — E11622 Type 2 diabetes mellitus with other skin ulcer: Secondary | ICD-10-CM | POA: Diagnosis not present

## 2021-05-12 DIAGNOSIS — J449 Chronic obstructive pulmonary disease, unspecified: Secondary | ICD-10-CM | POA: Diagnosis not present

## 2021-05-12 DIAGNOSIS — R2681 Unsteadiness on feet: Secondary | ICD-10-CM | POA: Diagnosis not present

## 2021-05-12 DIAGNOSIS — J9611 Chronic respiratory failure with hypoxia: Secondary | ICD-10-CM | POA: Diagnosis not present

## 2021-05-12 DIAGNOSIS — Z794 Long term (current) use of insulin: Secondary | ICD-10-CM | POA: Diagnosis not present

## 2021-05-15 DIAGNOSIS — R2681 Unsteadiness on feet: Secondary | ICD-10-CM | POA: Diagnosis not present

## 2021-05-15 DIAGNOSIS — J449 Chronic obstructive pulmonary disease, unspecified: Secondary | ICD-10-CM | POA: Diagnosis not present

## 2021-05-15 DIAGNOSIS — I739 Peripheral vascular disease, unspecified: Secondary | ICD-10-CM | POA: Diagnosis not present

## 2021-05-15 DIAGNOSIS — R531 Weakness: Secondary | ICD-10-CM | POA: Diagnosis not present

## 2021-05-15 DIAGNOSIS — E11622 Type 2 diabetes mellitus with other skin ulcer: Secondary | ICD-10-CM | POA: Diagnosis not present

## 2021-05-15 DIAGNOSIS — J9611 Chronic respiratory failure with hypoxia: Secondary | ICD-10-CM | POA: Diagnosis not present

## 2021-05-15 DIAGNOSIS — L97328 Non-pressure chronic ulcer of left ankle with other specified severity: Secondary | ICD-10-CM | POA: Diagnosis not present

## 2021-05-15 DIAGNOSIS — Z794 Long term (current) use of insulin: Secondary | ICD-10-CM | POA: Diagnosis not present

## 2021-05-15 DIAGNOSIS — E1151 Type 2 diabetes mellitus with diabetic peripheral angiopathy without gangrene: Secondary | ICD-10-CM | POA: Diagnosis not present

## 2021-05-19 DIAGNOSIS — L97328 Non-pressure chronic ulcer of left ankle with other specified severity: Secondary | ICD-10-CM | POA: Diagnosis not present

## 2021-05-19 DIAGNOSIS — E1151 Type 2 diabetes mellitus with diabetic peripheral angiopathy without gangrene: Secondary | ICD-10-CM | POA: Diagnosis not present

## 2021-05-19 DIAGNOSIS — I739 Peripheral vascular disease, unspecified: Secondary | ICD-10-CM | POA: Diagnosis not present

## 2021-05-19 DIAGNOSIS — Z794 Long term (current) use of insulin: Secondary | ICD-10-CM | POA: Diagnosis not present

## 2021-05-19 DIAGNOSIS — E11622 Type 2 diabetes mellitus with other skin ulcer: Secondary | ICD-10-CM | POA: Diagnosis not present

## 2021-05-19 DIAGNOSIS — R2681 Unsteadiness on feet: Secondary | ICD-10-CM | POA: Diagnosis not present

## 2021-05-19 DIAGNOSIS — J449 Chronic obstructive pulmonary disease, unspecified: Secondary | ICD-10-CM | POA: Diagnosis not present

## 2021-05-19 DIAGNOSIS — J9611 Chronic respiratory failure with hypoxia: Secondary | ICD-10-CM | POA: Diagnosis not present

## 2021-05-19 DIAGNOSIS — R531 Weakness: Secondary | ICD-10-CM | POA: Diagnosis not present

## 2021-05-21 DIAGNOSIS — E1151 Type 2 diabetes mellitus with diabetic peripheral angiopathy without gangrene: Secondary | ICD-10-CM | POA: Diagnosis not present

## 2021-05-21 DIAGNOSIS — L97328 Non-pressure chronic ulcer of left ankle with other specified severity: Secondary | ICD-10-CM | POA: Diagnosis not present

## 2021-05-21 DIAGNOSIS — J449 Chronic obstructive pulmonary disease, unspecified: Secondary | ICD-10-CM | POA: Diagnosis not present

## 2021-05-21 DIAGNOSIS — R2681 Unsteadiness on feet: Secondary | ICD-10-CM | POA: Diagnosis not present

## 2021-05-21 DIAGNOSIS — J9611 Chronic respiratory failure with hypoxia: Secondary | ICD-10-CM | POA: Diagnosis not present

## 2021-05-21 DIAGNOSIS — Z794 Long term (current) use of insulin: Secondary | ICD-10-CM | POA: Diagnosis not present

## 2021-05-21 DIAGNOSIS — R531 Weakness: Secondary | ICD-10-CM | POA: Diagnosis not present

## 2021-05-21 DIAGNOSIS — E11622 Type 2 diabetes mellitus with other skin ulcer: Secondary | ICD-10-CM | POA: Diagnosis not present

## 2021-05-21 DIAGNOSIS — I739 Peripheral vascular disease, unspecified: Secondary | ICD-10-CM | POA: Diagnosis not present

## 2021-05-22 DIAGNOSIS — I739 Peripheral vascular disease, unspecified: Secondary | ICD-10-CM | POA: Diagnosis not present

## 2021-05-22 DIAGNOSIS — R262 Difficulty in walking, not elsewhere classified: Secondary | ICD-10-CM | POA: Diagnosis not present

## 2021-05-22 DIAGNOSIS — J449 Chronic obstructive pulmonary disease, unspecified: Secondary | ICD-10-CM | POA: Diagnosis not present

## 2021-05-22 DIAGNOSIS — Z8673 Personal history of transient ischemic attack (TIA), and cerebral infarction without residual deficits: Secondary | ICD-10-CM | POA: Diagnosis not present

## 2021-05-22 DIAGNOSIS — I69919 Unspecified symptoms and signs involving cognitive functions following unspecified cerebrovascular disease: Secondary | ICD-10-CM | POA: Diagnosis not present

## 2021-05-22 DIAGNOSIS — E11621 Type 2 diabetes mellitus with foot ulcer: Secondary | ICD-10-CM | POA: Diagnosis not present

## 2021-05-22 DIAGNOSIS — I69354 Hemiplegia and hemiparesis following cerebral infarction affecting left non-dominant side: Secondary | ICD-10-CM | POA: Diagnosis not present

## 2021-05-22 DIAGNOSIS — Z794 Long term (current) use of insulin: Secondary | ICD-10-CM | POA: Diagnosis not present

## 2021-05-22 DIAGNOSIS — E1139 Type 2 diabetes mellitus with other diabetic ophthalmic complication: Secondary | ICD-10-CM | POA: Diagnosis not present

## 2021-05-22 DIAGNOSIS — I10 Essential (primary) hypertension: Secondary | ICD-10-CM | POA: Diagnosis not present

## 2021-05-22 DIAGNOSIS — L97428 Non-pressure chronic ulcer of left heel and midfoot with other specified severity: Secondary | ICD-10-CM | POA: Diagnosis not present

## 2021-05-22 DIAGNOSIS — Z7409 Other reduced mobility: Secondary | ICD-10-CM | POA: Diagnosis not present

## 2021-05-23 ENCOUNTER — Other Ambulatory Visit: Payer: Self-pay | Admitting: Endocrinology

## 2021-05-26 DIAGNOSIS — D500 Iron deficiency anemia secondary to blood loss (chronic): Secondary | ICD-10-CM | POA: Diagnosis not present

## 2021-05-26 DIAGNOSIS — I10 Essential (primary) hypertension: Secondary | ICD-10-CM | POA: Diagnosis not present

## 2021-05-26 DIAGNOSIS — I739 Peripheral vascular disease, unspecified: Secondary | ICD-10-CM | POA: Diagnosis not present

## 2021-05-26 DIAGNOSIS — R531 Weakness: Secondary | ICD-10-CM | POA: Diagnosis not present

## 2021-05-26 DIAGNOSIS — J9611 Chronic respiratory failure with hypoxia: Secondary | ICD-10-CM | POA: Diagnosis not present

## 2021-05-26 DIAGNOSIS — J449 Chronic obstructive pulmonary disease, unspecified: Secondary | ICD-10-CM | POA: Diagnosis not present

## 2021-05-26 DIAGNOSIS — E1165 Type 2 diabetes mellitus with hyperglycemia: Secondary | ICD-10-CM | POA: Diagnosis not present

## 2021-05-26 DIAGNOSIS — E1139 Type 2 diabetes mellitus with other diabetic ophthalmic complication: Secondary | ICD-10-CM | POA: Diagnosis not present

## 2021-05-26 DIAGNOSIS — E11621 Type 2 diabetes mellitus with foot ulcer: Secondary | ICD-10-CM | POA: Diagnosis not present

## 2021-05-26 DIAGNOSIS — E1151 Type 2 diabetes mellitus with diabetic peripheral angiopathy without gangrene: Secondary | ICD-10-CM | POA: Diagnosis not present

## 2021-05-26 DIAGNOSIS — R2681 Unsteadiness on feet: Secondary | ICD-10-CM | POA: Diagnosis not present

## 2021-05-26 DIAGNOSIS — L97328 Non-pressure chronic ulcer of left ankle with other specified severity: Secondary | ICD-10-CM | POA: Diagnosis not present

## 2021-05-26 DIAGNOSIS — E11622 Type 2 diabetes mellitus with other skin ulcer: Secondary | ICD-10-CM | POA: Diagnosis not present

## 2021-05-26 DIAGNOSIS — Z794 Long term (current) use of insulin: Secondary | ICD-10-CM | POA: Diagnosis not present

## 2021-05-28 ENCOUNTER — Ambulatory Visit: Payer: Medicare Other | Admitting: Podiatry

## 2021-05-29 DIAGNOSIS — L97328 Non-pressure chronic ulcer of left ankle with other specified severity: Secondary | ICD-10-CM | POA: Diagnosis not present

## 2021-05-29 DIAGNOSIS — R531 Weakness: Secondary | ICD-10-CM | POA: Diagnosis not present

## 2021-05-29 DIAGNOSIS — J9611 Chronic respiratory failure with hypoxia: Secondary | ICD-10-CM | POA: Diagnosis not present

## 2021-05-29 DIAGNOSIS — J439 Emphysema, unspecified: Secondary | ICD-10-CM | POA: Diagnosis not present

## 2021-05-29 DIAGNOSIS — I739 Peripheral vascular disease, unspecified: Secondary | ICD-10-CM | POA: Diagnosis not present

## 2021-05-29 DIAGNOSIS — J449 Chronic obstructive pulmonary disease, unspecified: Secondary | ICD-10-CM | POA: Diagnosis not present

## 2021-05-29 DIAGNOSIS — Z794 Long term (current) use of insulin: Secondary | ICD-10-CM | POA: Diagnosis not present

## 2021-05-29 DIAGNOSIS — E11622 Type 2 diabetes mellitus with other skin ulcer: Secondary | ICD-10-CM | POA: Diagnosis not present

## 2021-05-29 DIAGNOSIS — E1151 Type 2 diabetes mellitus with diabetic peripheral angiopathy without gangrene: Secondary | ICD-10-CM | POA: Diagnosis not present

## 2021-05-29 DIAGNOSIS — R2681 Unsteadiness on feet: Secondary | ICD-10-CM | POA: Diagnosis not present

## 2021-06-02 DIAGNOSIS — R531 Weakness: Secondary | ICD-10-CM | POA: Diagnosis not present

## 2021-06-04 DIAGNOSIS — E1151 Type 2 diabetes mellitus with diabetic peripheral angiopathy without gangrene: Secondary | ICD-10-CM | POA: Diagnosis not present

## 2021-06-04 DIAGNOSIS — L97328 Non-pressure chronic ulcer of left ankle with other specified severity: Secondary | ICD-10-CM | POA: Diagnosis not present

## 2021-06-04 DIAGNOSIS — I739 Peripheral vascular disease, unspecified: Secondary | ICD-10-CM | POA: Diagnosis not present

## 2021-06-04 DIAGNOSIS — E11622 Type 2 diabetes mellitus with other skin ulcer: Secondary | ICD-10-CM | POA: Diagnosis not present

## 2021-06-04 DIAGNOSIS — R2681 Unsteadiness on feet: Secondary | ICD-10-CM | POA: Diagnosis not present

## 2021-06-04 DIAGNOSIS — J9611 Chronic respiratory failure with hypoxia: Secondary | ICD-10-CM | POA: Diagnosis not present

## 2021-06-04 DIAGNOSIS — J449 Chronic obstructive pulmonary disease, unspecified: Secondary | ICD-10-CM | POA: Diagnosis not present

## 2021-06-04 DIAGNOSIS — R531 Weakness: Secondary | ICD-10-CM | POA: Diagnosis not present

## 2021-06-04 DIAGNOSIS — Z794 Long term (current) use of insulin: Secondary | ICD-10-CM | POA: Diagnosis not present

## 2021-06-09 DIAGNOSIS — E11622 Type 2 diabetes mellitus with other skin ulcer: Secondary | ICD-10-CM | POA: Diagnosis not present

## 2021-06-09 DIAGNOSIS — E1151 Type 2 diabetes mellitus with diabetic peripheral angiopathy without gangrene: Secondary | ICD-10-CM | POA: Diagnosis not present

## 2021-06-09 DIAGNOSIS — J449 Chronic obstructive pulmonary disease, unspecified: Secondary | ICD-10-CM | POA: Diagnosis not present

## 2021-06-09 DIAGNOSIS — J9611 Chronic respiratory failure with hypoxia: Secondary | ICD-10-CM | POA: Diagnosis not present

## 2021-06-09 DIAGNOSIS — I739 Peripheral vascular disease, unspecified: Secondary | ICD-10-CM | POA: Diagnosis not present

## 2021-06-09 DIAGNOSIS — L97328 Non-pressure chronic ulcer of left ankle with other specified severity: Secondary | ICD-10-CM | POA: Diagnosis not present

## 2021-06-09 DIAGNOSIS — Z794 Long term (current) use of insulin: Secondary | ICD-10-CM | POA: Diagnosis not present

## 2021-06-09 DIAGNOSIS — L89893 Pressure ulcer of other site, stage 3: Secondary | ICD-10-CM | POA: Diagnosis not present

## 2021-06-09 DIAGNOSIS — R531 Weakness: Secondary | ICD-10-CM | POA: Diagnosis not present

## 2021-06-09 DIAGNOSIS — R2681 Unsteadiness on feet: Secondary | ICD-10-CM | POA: Diagnosis not present

## 2021-06-12 DIAGNOSIS — L89893 Pressure ulcer of other site, stage 3: Secondary | ICD-10-CM | POA: Diagnosis not present

## 2021-06-12 DIAGNOSIS — I739 Peripheral vascular disease, unspecified: Secondary | ICD-10-CM | POA: Diagnosis not present

## 2021-06-12 DIAGNOSIS — L97328 Non-pressure chronic ulcer of left ankle with other specified severity: Secondary | ICD-10-CM | POA: Diagnosis not present

## 2021-06-12 DIAGNOSIS — E1151 Type 2 diabetes mellitus with diabetic peripheral angiopathy without gangrene: Secondary | ICD-10-CM | POA: Diagnosis not present

## 2021-06-12 DIAGNOSIS — R2681 Unsteadiness on feet: Secondary | ICD-10-CM | POA: Diagnosis not present

## 2021-06-12 DIAGNOSIS — J449 Chronic obstructive pulmonary disease, unspecified: Secondary | ICD-10-CM | POA: Diagnosis not present

## 2021-06-12 DIAGNOSIS — E11622 Type 2 diabetes mellitus with other skin ulcer: Secondary | ICD-10-CM | POA: Diagnosis not present

## 2021-06-12 DIAGNOSIS — Z794 Long term (current) use of insulin: Secondary | ICD-10-CM | POA: Diagnosis not present

## 2021-06-12 DIAGNOSIS — J9611 Chronic respiratory failure with hypoxia: Secondary | ICD-10-CM | POA: Diagnosis not present

## 2021-06-12 DIAGNOSIS — R531 Weakness: Secondary | ICD-10-CM | POA: Diagnosis not present

## 2021-06-16 DIAGNOSIS — Z794 Long term (current) use of insulin: Secondary | ICD-10-CM | POA: Diagnosis not present

## 2021-06-16 DIAGNOSIS — I739 Peripheral vascular disease, unspecified: Secondary | ICD-10-CM | POA: Diagnosis not present

## 2021-06-16 DIAGNOSIS — J449 Chronic obstructive pulmonary disease, unspecified: Secondary | ICD-10-CM | POA: Diagnosis not present

## 2021-06-16 DIAGNOSIS — R2681 Unsteadiness on feet: Secondary | ICD-10-CM | POA: Diagnosis not present

## 2021-06-16 DIAGNOSIS — E11622 Type 2 diabetes mellitus with other skin ulcer: Secondary | ICD-10-CM | POA: Diagnosis not present

## 2021-06-16 DIAGNOSIS — J9611 Chronic respiratory failure with hypoxia: Secondary | ICD-10-CM | POA: Diagnosis not present

## 2021-06-16 DIAGNOSIS — L89893 Pressure ulcer of other site, stage 3: Secondary | ICD-10-CM | POA: Diagnosis not present

## 2021-06-16 DIAGNOSIS — R531 Weakness: Secondary | ICD-10-CM | POA: Diagnosis not present

## 2021-06-16 DIAGNOSIS — L97328 Non-pressure chronic ulcer of left ankle with other specified severity: Secondary | ICD-10-CM | POA: Diagnosis not present

## 2021-06-16 DIAGNOSIS — E1151 Type 2 diabetes mellitus with diabetic peripheral angiopathy without gangrene: Secondary | ICD-10-CM | POA: Diagnosis not present

## 2021-06-24 DIAGNOSIS — E1151 Type 2 diabetes mellitus with diabetic peripheral angiopathy without gangrene: Secondary | ICD-10-CM | POA: Diagnosis not present

## 2021-06-24 DIAGNOSIS — R531 Weakness: Secondary | ICD-10-CM | POA: Diagnosis not present

## 2021-06-24 DIAGNOSIS — I10 Essential (primary) hypertension: Secondary | ICD-10-CM | POA: Diagnosis not present

## 2021-06-24 DIAGNOSIS — Z794 Long term (current) use of insulin: Secondary | ICD-10-CM | POA: Diagnosis not present

## 2021-06-24 DIAGNOSIS — D500 Iron deficiency anemia secondary to blood loss (chronic): Secondary | ICD-10-CM | POA: Diagnosis not present

## 2021-06-24 DIAGNOSIS — E1139 Type 2 diabetes mellitus with other diabetic ophthalmic complication: Secondary | ICD-10-CM | POA: Diagnosis not present

## 2021-06-24 DIAGNOSIS — L89893 Pressure ulcer of other site, stage 3: Secondary | ICD-10-CM | POA: Diagnosis not present

## 2021-06-24 DIAGNOSIS — E11622 Type 2 diabetes mellitus with other skin ulcer: Secondary | ICD-10-CM | POA: Diagnosis not present

## 2021-06-24 DIAGNOSIS — I739 Peripheral vascular disease, unspecified: Secondary | ICD-10-CM | POA: Diagnosis not present

## 2021-06-24 DIAGNOSIS — L97328 Non-pressure chronic ulcer of left ankle with other specified severity: Secondary | ICD-10-CM | POA: Diagnosis not present

## 2021-06-24 DIAGNOSIS — E11621 Type 2 diabetes mellitus with foot ulcer: Secondary | ICD-10-CM | POA: Diagnosis not present

## 2021-06-24 DIAGNOSIS — J9611 Chronic respiratory failure with hypoxia: Secondary | ICD-10-CM | POA: Diagnosis not present

## 2021-06-24 DIAGNOSIS — E1165 Type 2 diabetes mellitus with hyperglycemia: Secondary | ICD-10-CM | POA: Diagnosis not present

## 2021-06-24 DIAGNOSIS — J449 Chronic obstructive pulmonary disease, unspecified: Secondary | ICD-10-CM | POA: Diagnosis not present

## 2021-06-24 DIAGNOSIS — R2681 Unsteadiness on feet: Secondary | ICD-10-CM | POA: Diagnosis not present

## 2021-06-26 ENCOUNTER — Encounter (HOSPITAL_BASED_OUTPATIENT_CLINIC_OR_DEPARTMENT_OTHER): Payer: Medicare Other | Attending: Internal Medicine | Admitting: Internal Medicine

## 2021-06-26 ENCOUNTER — Other Ambulatory Visit: Payer: Self-pay

## 2021-06-26 DIAGNOSIS — J9601 Acute respiratory failure with hypoxia: Secondary | ICD-10-CM | POA: Diagnosis not present

## 2021-06-26 DIAGNOSIS — I70202 Unspecified atherosclerosis of native arteries of extremities, left leg: Secondary | ICD-10-CM | POA: Diagnosis not present

## 2021-06-26 DIAGNOSIS — G9341 Metabolic encephalopathy: Secondary | ICD-10-CM | POA: Diagnosis not present

## 2021-06-26 DIAGNOSIS — J1000 Influenza due to other identified influenza virus with unspecified type of pneumonia: Secondary | ICD-10-CM | POA: Diagnosis not present

## 2021-06-26 DIAGNOSIS — L97329 Non-pressure chronic ulcer of left ankle with unspecified severity: Secondary | ICD-10-CM | POA: Diagnosis not present

## 2021-06-26 DIAGNOSIS — R509 Fever, unspecified: Secondary | ICD-10-CM | POA: Diagnosis not present

## 2021-06-26 DIAGNOSIS — Z95820 Peripheral vascular angioplasty status with implants and grafts: Secondary | ICD-10-CM | POA: Diagnosis not present

## 2021-06-26 DIAGNOSIS — F17210 Nicotine dependence, cigarettes, uncomplicated: Secondary | ICD-10-CM | POA: Diagnosis not present

## 2021-06-26 DIAGNOSIS — I959 Hypotension, unspecified: Secondary | ICD-10-CM | POA: Diagnosis not present

## 2021-06-26 DIAGNOSIS — I6623 Occlusion and stenosis of bilateral posterior cerebral arteries: Secondary | ICD-10-CM | POA: Diagnosis not present

## 2021-06-26 DIAGNOSIS — E11621 Type 2 diabetes mellitus with foot ulcer: Secondary | ICD-10-CM | POA: Diagnosis not present

## 2021-06-26 DIAGNOSIS — E1151 Type 2 diabetes mellitus with diabetic peripheral angiopathy without gangrene: Secondary | ICD-10-CM | POA: Diagnosis not present

## 2021-06-26 DIAGNOSIS — R4701 Aphasia: Secondary | ICD-10-CM | POA: Diagnosis not present

## 2021-06-26 DIAGNOSIS — R29810 Facial weakness: Secondary | ICD-10-CM | POA: Diagnosis not present

## 2021-06-26 DIAGNOSIS — I63542 Cerebral infarction due to unspecified occlusion or stenosis of left cerebellar artery: Secondary | ICD-10-CM | POA: Diagnosis not present

## 2021-06-26 DIAGNOSIS — A4189 Other specified sepsis: Secondary | ICD-10-CM | POA: Diagnosis not present

## 2021-06-26 DIAGNOSIS — I69354 Hemiplegia and hemiparesis following cerebral infarction affecting left non-dominant side: Secondary | ICD-10-CM | POA: Diagnosis not present

## 2021-06-26 DIAGNOSIS — I10 Essential (primary) hypertension: Secondary | ICD-10-CM | POA: Diagnosis not present

## 2021-06-26 DIAGNOSIS — I739 Peripheral vascular disease, unspecified: Secondary | ICD-10-CM | POA: Diagnosis not present

## 2021-06-26 DIAGNOSIS — I6503 Occlusion and stenosis of bilateral vertebral arteries: Secondary | ICD-10-CM | POA: Diagnosis not present

## 2021-06-26 DIAGNOSIS — L89893 Pressure ulcer of other site, stage 3: Secondary | ICD-10-CM

## 2021-06-26 DIAGNOSIS — Z833 Family history of diabetes mellitus: Secondary | ICD-10-CM | POA: Diagnosis not present

## 2021-06-26 DIAGNOSIS — Z8616 Personal history of COVID-19: Secondary | ICD-10-CM | POA: Diagnosis not present

## 2021-06-26 DIAGNOSIS — E1165 Type 2 diabetes mellitus with hyperglycemia: Secondary | ICD-10-CM | POA: Diagnosis not present

## 2021-06-26 DIAGNOSIS — I6523 Occlusion and stenosis of bilateral carotid arteries: Secondary | ICD-10-CM | POA: Diagnosis not present

## 2021-06-26 DIAGNOSIS — B9789 Other viral agents as the cause of diseases classified elsewhere: Secondary | ICD-10-CM | POA: Diagnosis not present

## 2021-06-26 DIAGNOSIS — Z794 Long term (current) use of insulin: Secondary | ICD-10-CM | POA: Diagnosis not present

## 2021-06-26 DIAGNOSIS — J440 Chronic obstructive pulmonary disease with acute lower respiratory infection: Secondary | ICD-10-CM | POA: Diagnosis not present

## 2021-06-26 DIAGNOSIS — I6603 Occlusion and stenosis of bilateral middle cerebral arteries: Secondary | ICD-10-CM | POA: Diagnosis not present

## 2021-06-26 DIAGNOSIS — Z20822 Contact with and (suspected) exposure to covid-19: Secondary | ICD-10-CM | POA: Diagnosis not present

## 2021-06-26 DIAGNOSIS — S91302A Unspecified open wound, left foot, initial encounter: Secondary | ICD-10-CM

## 2021-06-26 NOTE — Progress Notes (Signed)
Crystal, Moore ([id number]) Visit Report for 06/26/2021 Abuse/Suicide Risk Screen Details Patient Name: Date of Service: Crystal, Moore 06/26/2021 1:15 PM Medical Record Number: [id number] Patient Account Number: [id number] Date of Birth/Sex: Treating RN: [birth year] (75 y.o. [name] Primary Care Chozen Latulippe: [name] Other Clinician: Referring Avery Eustice: Treating Kailyn Dubie/Extender: [name] Weeks in Treatment: 0 Abuse/Suicide Risk Screen Items Answer ABUSE RISK SCREEN: Has anyone close to you tried to hurt or harm you recentlyo No Do you feel uncomfortable with anyone in your familyo No Has anyone forced you do things that you didnt want to doo No Electronic Signature(s) Signed: 06/26/2021 5:30:54 PM By: Lorrin Jackson Entered By: Lorrin Jackson on 06/26/2021 14:00:25 -------------------------------------------------------------------------------- Activities of Daily Living Details Patient Name: Date of Service: Crystal, Moore 06/26/2021 1:15 PM Medical Record Number: [id number] Patient Account Number: [id number] Date of Birth/Sex: Treating RN: [birth year] (75 y.o. [name] Primary Care Jeannett Dekoning: [name] Other Clinician: Referring Leonid Manus: Treating Jhoan Schmieder/Extender: [name] Weeks in Treatment: 0 Activities of Daily Living Items Answer Activities of Daily Living (Please select one for each item) Drive Automobile Not Able T Medications ake Need Assistance Use T elephone Need Assistance Care for Appearance Need Assistance Use T oilet Need Assistance Bath / Shower Need Assistance Dress Self Need Assistance Feed Self Completely Able Walk Need Assistance Get In / Out Bed Need Assistance Housework Need Assistance Prepare Meals Not Able Handle Money Need Assistance Shop for Self Not Able Electronic Signature(s) Signed: 06/26/2021 5:30:54 PM By: Lorrin Jackson Entered By: Lorrin Jackson on 06/26/2021 14:01:07 -------------------------------------------------------------------------------- Education Screening Details Patient Name: Date of Service: Crystal Lennox. 06/26/2021 1:15 PM Medical Record Number: [id number] Patient Account Number: [id number] Date of Birth/Sex: Treating RN: 1946/06/29 (75 y.o. [name] Primary Care Astin Sayre: [name] Other Clinician: Referring Orchid Glassberg: Treating Jazmaine Fuelling/Extender: [name] in Treatment: 0 Primary Learner Assessed: Patient Learning Preferences/Education Level/Primary Language Learning Preference: Explanation, Demonstration, Printed Material Highest Education Level: High School Preferred Language: English Cognitive Barrier Language Barrier: No Translator Needed: No Memory Deficit: No Emotional Barrier: No Cultural/Religious Beliefs Affecting Medical Care: No Physical Barrier Impaired Vision: No Impaired Hearing: No Decreased Hand dexterity: No Knowledge/Comprehension Knowledge Level: Medium Comprehension Level: Medium Ability to understand written instructions: Medium Ability to understand verbal instructions: Medium Motivation Anxiety Level: Calm Cooperation: Cooperative Education Importance: Acknowledges Need Interest in Health Problems: Asks Questions Perception: Coherent Willingness to Engage in Self-Management Medium Activities: Readiness to Engage in Self-Management Medium Activities: Electronic Signature(s) Signed: 06/26/2021 5:30:54 PM By: Lorrin Jackson Entered By: Lorrin Jackson on 06/26/2021 14:01:47 -------------------------------------------------------------------------------- Fall Risk Assessment Details Patient Name: Date of Service: Crystal Grammes M. 06/26/2021 1:15 PM Medical Record Number: [id number] Patient Account Number: [id number] Date of Birth/Sex: Treating RN: [birth year] (75 y.o. [name] Primary Care Chanelle Hodsdon: [name] Other Clinician: Referring Konstantin Lehnen: Treating Iva Posten/Extender: [name] Weeks in Treatment: 0 Fall Risk Assessment Items Have you had 2 or more falls in the last 12 monthso 0 No Have you had any fall that resulted in injury in the last 12 monthso 0 No FALLS RISK SCREEN History of falling - immediate or within 3 months 0 No Secondary diagnosis (Do you have 2 or more medical diagnoseso) 15 Yes Ambulatory aid None/bed rest/wheelchair/nurse 0 Yes Crutches/cane/walker 0 No Furniture 0 No Intravenous therapy Access/Saline/Heparin Lock 0 No Gait/Transferring Normal/ bed rest/ wheelchair 0 Yes Weak (short steps with or without shuffle, stooped but able  to lift head while walking, may seek 0 No support from furniture) Impaired (short steps with shuffle, may have difficulty arising from chair, head down, impaired 0 No balance) Mental Status Oriented to own ability 0 Yes Electronic Signature(s) Signed: 06/26/2021 5:30:54 PM By: Lorrin Jackson Entered By: Lorrin Jackson on 06/26/2021 14:02:07 -------------------------------------------------------------------------------- Foot Assessment Details Patient Name: Date of Service: Crystal Lennox. 06/26/2021 1:15 PM Medical Record Number: [id number] Patient Account Number: [id number] Date of Birth/Sex: Treating RN: [birth year] (75 y.o. [name] Primary Care Miloh Alcocer: [name] Other Clinician: Referring Blen Ransome: Treating Windy Dudek/Extender: [name] Weeks in Treatment: 0 Foot Assessment Items [x]  Unable to perform due to altered mental status Site Locations + = Sensation present, - = Sensation absent, C = Callus, U = Ulcer R = Redness, W = Warmth, M = Maceration, PU = Pre-ulcerative lesion F = Fissure, S = Swelling, D = Dryness Assessment Right: Left: Other Deformity: No No Prior Foot Ulcer: No  No Prior Amputation: No No Charcot Joint: No No Ambulatory Status: Ambulatory With Help Assistance Device: Wheelchair Gait: Steady Electronic Signature(s) Signed: 06/26/2021 5:30:54 PM By: Lorrin Jackson Entered By: Lorrin Jackson on 06/26/2021 14:06:23 -------------------------------------------------------------------------------- Nutrition Risk Screening Details Patient Name: Date of Service: Crystal, Moore 06/26/2021 1:15 PM Medical Record Number: [id number] Patient Account Number: [id number] Date of Birth/Sex: Treating RN: 23-Feb-1946 (75 y.o. [name] Primary Care Kelsay Haggard: [name] Other Clinician: Referring Mayela Bullard: Treating Rowene Suto/Extender: [name] Weeks in Treatment: 0 Height (in): Weight (lbs): Body Mass Index (BMI): Nutrition Risk Screening Items Score Screening NUTRITION RISK SCREEN: I have an illness or condition that made me change the kind and/or amount of food I eat 0 No I eat fewer than two meals per day 0 No I eat few fruits and vegetables, or milk products 0 No I have three or more drinks of beer, liquor or wine almost every day 0 No I have tooth or mouth problems that make it hard for me to eat 0 No I don't always have enough money to buy the food I need 0 No I eat alone most of the time 0 No I take three or more different prescribed or over-the-counter drugs a day 1 Yes Without wanting to, I have lost or gained 10 pounds in the last six months 0 No I am not always physically able to shop, cook and/or feed myself 0 No Nutrition Protocols Good Risk Protocol 0 No interventions needed Moderate Risk Protocol High Risk Proctocol Risk Level: Good Risk Score: 1 Electronic Signature(s) Signed: 06/26/2021 5:30:54 PM By: Lorrin Jackson Entered By: Lorrin Jackson on 06/26/2021 14:02:22

## 2021-06-26 NOTE — Progress Notes (Signed)
Crystal, Moore ([id number]) Visit Report for 06/26/2021 Allergy List Details Patient Name: Date of Service: Crystal Moore, Crystal Moore 06/26/2021 1:15 PM Medical Record Number: [id number] Patient Account Number: [id number] Date of Birth/Sex: Treating RN: 1945/11/19 (75 y.o. [name] Primary Care Raychelle Hudman: [name] Other Clinician: Referring Jordani Nunn: Treating Destyn Parfitt/Extender: Lake Bells, Rupashree Weeks in Treatment: 0 Allergies Active Allergies No Known Drug Allergies Allergy Notes Electronic Signature(s) Signed: 06/26/2021 5:30:54 PM By: Lorrin Jackson Entered By: Lorrin Jackson on 06/26/2021 13:59:51 -------------------------------------------------------------------------------- Arrival Information Details Patient Name: Date of Service: Crystal Moore. 06/26/2021 1:15 PM Medical Record Number: [id number] Patient Account Number: [id number] Date of Birth/Sex: Treating RN: 20-Jul-1946 (75 y.o. [name] Primary Care Esme Freund: [name] Other Clinician: Referring Leeasia Secrist: Treating Sandie Swayze/Extender: [name] in Treatment: 0 Visit Information Patient Arrived: Wheel Chair Arrival Time: 13:57 Accompanied By: Daughter Transfer Assistance: Manual Patient Identification Verified: Yes Secondary Verification Process Completed: Yes Patient Requires Transmission-Based Precautions: No Patient Has Alerts: Yes Patient Alerts: Patient on Blood Thinner L ABI=.96 10/03/20 History Since Last Visit Added or deleted any medications: No Any new allergies or adverse reactions: No Had a fall or experienced change in activities of daily living that may affect risk of falls: No Signs or symptoms of abuse/neglect since last visito No Hospitalized since last visit: No Implantable device outside of the clinic excluding cellular tissue based products placed in the center since last visit: No Has Dressing in  Place as Prescribed: Yes Electronic Signature(s) Signed: 06/26/2021 3:02:24 PM By: Lorrin Jackson Previous Signature: 06/26/2021 2:29:38 PM Version By: Lorrin Jackson Entered By: Lorrin Jackson on 06/26/2021 15:02:24 -------------------------------------------------------------------------------- Clinic Level of Care Assessment Details Patient Name: Date of Service: Crystal Moore. 06/26/2021 1:15 PM Medical Record Number: [id number] Patient Account Number: [id number] Date of Birth/Sex: Treating RN: [birth year] (75 y.o. [name] Primary Care Lyndia Bury: [name] Other Clinician: Referring Kamani Lewter: Treating Nilam Quakenbush/Extender: [name] Weeks in Treatment: 0 Clinic Level of Care Assessment Items TOOL 1 Quantity Score X- 1 0 Use when EandM and Procedure is performed on INITIAL visit ASSESSMENTS - Nursing Assessment / Reassessment X- 1 20 General Physical Exam (combine w/ comprehensive assessment (listed just below) when performed on new pt. evals) X- 1 25 Comprehensive Assessment (HX, ROS, Risk Assessments, Wounds Hx, etc.) ASSESSMENTS - Wound and Skin Assessment / Reassessment []  - 0 Dermatologic / Skin Assessment (not related to wound area) ASSESSMENTS - Ostomy and/or Continence Assessment and Care []  - 0 Incontinence Assessment and Management []  - 0 Ostomy Care Assessment and Management (repouching, etc.) PROCESS - Coordination of Care []  - 0 Simple Patient / Family Education for ongoing care X- 1 20 Complex (extensive) Patient / Family Education for ongoing care X- 1 10 Staff obtains Programmer, systems, Records, T Results / Process Orders est X- 1 10 Staff telephones HHA, Nursing Homes / Clarify orders / etc []  - 0 Routine Transfer to another Facility (non-emergent condition) []  - 0 Routine Hospital Admission (non-emergent condition) []  - 0 New Admissions / Biomedical engineer / Ordering NPWT Apligraf, etc. , []  -  0 Emergency Hospital Admission (emergent condition) PROCESS - Special Needs []  - 0 Pediatric / Minor Patient Management []  - 0 Isolation Patient Management []  - 0 Hearing / Language / Visual special needs []  - 0 Assessment of Community assistance (transportation, D/C planning, etc.) []  - 0 Additional assistance / Altered mentation []  - 0 Support Surface(s) Assessment (bed, cushion, seat, etc.) INTERVENTIONS - Miscellaneous []  -  0 External ear exam []  - 0 Patient Transfer (multiple staff / Civil Service fast streamer / Similar devices) []  - 0 Simple Staple / Suture removal (25 or less) []  - 0 Complex Staple / Suture removal (26 or more) []  - 0 Hypo/Hyperglycemic Management (do not check if billed separately) []  - 0 Ankle / Brachial Index (ABI) - do not check if billed separately Has the patient been seen at the hospital within the last three years: Yes Total Score: 85 Level Of Care: New/Established - Level 3 Electronic Signature(s) Signed: 06/26/2021 5:30:54 PM By: Lorrin Jackson Signed: 06/26/2021 5:30:54 PM By: Lorrin Jackson Entered By: Lorrin Jackson on 06/26/2021 16:30:37 -------------------------------------------------------------------------------- Encounter Discharge Information Details Patient Name: Date of Service: Crystal Moore. 06/26/2021 1:15 PM Medical Record Number: [id number] Patient Account Number: [id number] Date of Birth/Sex: Treating RN: 1945-09-09 (75 y.o. [name] Primary Care Shaketha Jeon: [name] Other Clinician: Referring Jarmel Linhardt: Treating [name]/Extender: [name] Weeks in Treatment: 0 Encounter Discharge Information Items Post Procedure Vitals Discharge Condition: Stable Temperature (F): 99 Ambulatory Status: Wheelchair Pulse (bpm): 48 Discharge Destination: Home Respiratory Rate (breaths/min): 18 Transportation: Private Auto Blood Pressure (mmHg): 171/70 Accompanied By: Daughter Schedule  Follow-up Appointment: Yes Clinical Summary of Care: Provided on 06/26/2021 Form Type Recipient Paper Patient Patient Electronic Signature(s) Signed: 06/26/2021 4:32:34 PM By: Lorrin Jackson Entered By: Lorrin Jackson on 06/26/2021 16:32:34 -------------------------------------------------------------------------------- Lower Extremity Assessment Details Patient Name: Date of Service: Crystal Moore. 06/26/2021 1:15 PM Medical Record Number: [id number] Patient Account Number: [id number] Date of Birth/Sex: Treating RN: [birth year] (75 y.o. [name] Primary Care Kirill Chatterjee: [name] Other Clinician: Referring Lauralye Kinn: Treating Clariza Sickman/Extender: [name] Weeks in Treatment: 0 Edema Assessment Assessed: [Left: Yes] [Right: No] Edema: [Left: Ye] [Right: s] Calf Left: Right: Point of Measurement: 29 cm From Medial Instep 32 cm Ankle Left: Right: Point of Measurement: 7 cm From Medial Instep 20.5 cm Vascular Assessment Pulses: Dorsalis Pedis Palpable: [Left:No Yes] Notes L ABI=0.37 08/08/20 Electronic Signature(s) Signed: 06/26/2021 5:30:54 PM By: Lorrin Jackson Entered By: Lorrin Jackson on 06/26/2021 14:08:49 -------------------------------------------------------------------------------- Multi Wound Chart Details Patient Name: Date of Service: Crystal Moore. 06/26/2021 1:15 PM Medical Record Number: [id number] Patient Account Number: [id number] Date of Birth/Sex: Treating RN: 03-19-1946 (75 y.o. F) Primary Care Consuella Scurlock: [name] Other Clinician: Referring Juandiego Kolenovic: Treating Rosaria Kubin/Extender: Lake Bells, Rupashree Weeks in Treatment: 0 Vital Signs Height(in): Pulse(bpm): 48 Weight(lbs): Blood Pressure(mmHg): 171/70 Body Mass Index(BMI): Temperature(F): 99 Respiratory Rate(breaths/min): 18 Photos: [1:No Photos] Left, Lateral Malleolus Left Metatarsal head fifth Left, Lateral  Ankle Wound Location: Pressure Injury Pressure Injury Pressure Injury Wounding Event: Arterial Insufficiency Ulcer Pressure Ulcer Pressure Ulcer Primary Etiology: Cataracts, Chronic Obstructive Cataracts, Chronic Obstructive Cataracts, Chronic Obstructive Comorbid History: Pulmonary Disease (COPD), Pulmonary Disease (COPD), Pulmonary Disease (COPD), Hypertension, Type II Diabetes, Hypertension, Type II Diabetes, Hypertension, Type II Diabetes, Osteoarthritis, Seizure Disorder Osteoarthritis, Seizure Disorder Osteoarthritis, Seizure Disorder 03/27/2020 06/05/2021 03/27/2020 Date Acquired: 46 0 0 Weeks of Treatment: Open Open Open Wound Status: 1.6x1x0.3 1x1.5x0.2 1.6x1x0.3 Measurements L x W x D (cm) 1.257 1.178 1.257 A (cm) : rea 0.377 0.236 0.377 Volume (cm) : -33.40% 0.00% 0.00% % Reduction in A rea: -100.50% 0.00% 0.00% % Reduction in Volume: Full Thickness Without Exposed Category/Stage III Category/Stage III Classification: Support Structures Medium Medium Medium Exudate A mount: Serosanguineous Purulent Serosanguineous Exudate Type: red, brown yellow, brown, green red, brown Exudate Color: Distinct, outline attached Distinct, outline attached Distinct, outline attached Wound Margin: Medium (34-66%)  Small (1-33%) Large (67-100%) Granulation A mount: Red Red Red Granulation Quality: Medium (34-66%) Large (67-100%) Small (1-33%) Necrotic A mount: Fat Layer (Subcutaneous Tissue): Yes Fat Layer (Subcutaneous Tissue): Yes Fat Layer (Subcutaneous Tissue): Yes Exposed Structures: Fascia: No Fascia: No Fascia: No Tendon: No Tendon: No Tendon: No Muscle: No Muscle: No Muscle: No Joint: No Joint: No Joint: No Bone: No Bone: No Bone: No None None None Epithelialization: N/A Debridement - Excisional Debridement - Selective/Open Wound Debridement: Pre-procedure Verification/Time Out N/A 14:29 14:29 Taken: N/A Other Other Pain Control: N/A Subcutaneous,  [facility] Tissue Debrided: N/A Skin/Subcutaneous Tissue Non-Viable Tissue Level: N/A 1.5 1.6 Debridement A (sq cm): rea N/A Curette Curette Instrument: N/A Minimum Minimum Bleeding: N/A Pressure Pressure Hemostasis Achieved: N/A Procedure was tolerated well Procedure was tolerated well Debridement Treatment Response: N/A 1x1.5x0.2 1.6x1x0.3 Post Debridement Measurements L x W x D (cm) N/A 0.236 0.377 Post Debridement Volume: (cm) N/A Category/Stage III Category/Stage III Post Debridement Stage: N/A Debridement Debridement Procedures Performed: Treatment Notes Electronic Signature(s) Signed: 06/26/2021 3:07:44 PM By: Kalman Shan DO Entered By: Kalman Shan on 06/26/2021 14:55:55 -------------------------------------------------------------------------------- [place] Details Patient Name: Date of Service: Crystal Moore. 06/26/2021 1:15 PM Medical Record Number: [id number] Patient Account Number: [id number] Date of Birth/Sex: Treating RN: 09-Feb-1946 (75 y.o. [name] Primary Care Enma Maeda: [name] Other Clinician: Referring Teller Wakefield: Treating Christophr Calix/Extender: [name] Weeks in Treatment: 0 Active Inactive Pressure Nursing Diagnoses: Knowledge deficit related to management of pressures ulcers Goals: Patient will remain free of pressure ulcers Date Initiated: 06/26/2021 Target Resolution Date: 07/31/2021 Goal Status: Active Interventions: Assess: immobility, friction, shearing, incontinence upon admission and as needed Assess offloading mechanisms upon admission and as needed Assess potential for pressure ulcer upon admission and as needed Treatment Activities: Patient referred for home evaluation of offloading devices/mattresses : 06/26/2021 Notes: Wound/Skin Impairment Nursing Diagnoses: Impaired tissue integrity Goals: Patient/caregiver will verbalize understanding of  skin care regimen Date Initiated: 06/26/2021 Target Resolution Date: 07/31/2021 Goal Status: Active Ulcer/skin breakdown will have a volume reduction of 30% by week 4 Date Initiated: 06/26/2021 Target Resolution Date: 07/31/2021 Goal Status: Active Interventions: Assess patient/caregiver ability to obtain necessary supplies Assess patient/caregiver ability to perform ulcer/skin care regimen upon admission and as needed Assess ulceration(s) every visit Provide education on ulcer and skin care Treatment Activities: Topical wound management initiated : 06/26/2021 Notes: Electronic Signature(s) Signed: 06/26/2021 2:25:34 PM By: Lorrin Jackson Entered By: Lorrin Jackson on 06/26/2021 14:25:34 -------------------------------------------------------------------------------- Patient/Caregiver Education Details Patient Name: Date of Service: Crystal Moore 12/1/2022andnbsp1:15 PM Medical Record Number: [id number] Patient Account Number: [id number] Date of Birth/Gender: Treating RN: 06-06-1946 (75 y.o. [name] Primary Care Physician: [name] Other Clinician: Referring Physician: Treating Physician/Extender: [name] in Treatment: 0 Education Assessment Education Provided To: Patient and Caregiver Education Topics Provided Pressure: Methods: Explain/Verbal, Printed Responses: State content correctly Wound/Skin Impairment: Methods: Explain/Verbal, Printed Responses: State content correctly Electronic Signature(s) Signed: 06/26/2021 5:30:54 PM By: Lorrin Jackson Entered By: Lorrin Jackson on 06/26/2021 14:26:11 -------------------------------------------------------------------------------- Wound Assessment Details Patient Name: Date of Service: Crystal Moore. 06/26/2021 1:15 PM Medical Record Number: [id number] Patient Account Number: [id number] Date of Birth/Sex: Treating RN: 11/22/1945 (75 y.o. [name] Primary Care Mitali Shenefield: [name] Other Clinician: Referring Story Conti: Treating Dewarren Ledbetter/Extender: [name] Weeks in Treatment: 0 Wound Status Wound Number: 1 Primary Arterial Insufficiency Ulcer Etiology: Wound Location: Left, Lateral Malleolus Wound Open Wounding Event: Pressure Injury Status: Date Acquired:  03/27/2020 Comorbid Cataracts, Chronic Obstructive Pulmonary Disease (COPD), Weeks Of Treatment: 46 History: Hypertension, Type II Diabetes, Osteoarthritis, Seizure Disorder Clustered Wound: No Wound Measurements Length: (cm) 1.6 Width: (cm) 1 Depth: (cm) 0.3 Area: (cm) 1.257 Volume: (cm) 0.377 % Reduction in Area: -33.4% % Reduction in Volume: -100.5% Epithelialization: None Tunneling: No Undermining: No Wound Description Classification: Full Thickness Without Exposed Support Structures Wound Margin: Distinct, outline attached Exudate Amount: Medium Exudate Type: Serosanguineous Exudate Color: red, brown Foul Odor After Cleansing: No Slough/Fibrino Yes Wound Bed Granulation Amount: Medium (34-66%) Exposed Structure Granulation Quality: Red Fascia Exposed: No Necrotic Amount: Medium (34-66%) Fat Layer (Subcutaneous Tissue) Exposed: Yes Necrotic Quality: Adherent Slough Tendon Exposed: No Muscle Exposed: No Joint Exposed: No Bone Exposed: No Electronic Signature(s) Signed: 06/26/2021 5:30:54 PM By: Lorrin Jackson Entered By: Lorrin Jackson on 06/26/2021 14:13:55 -------------------------------------------------------------------------------- Wound Assessment Details Patient Name: Date of Service: Crystal Moore. 06/26/2021 1:15 PM Medical Record Number: [id number] Patient Account Number: [id number] Date of Birth/Sex: Treating RN: 1945-10-10 (75 y.o. [name] Primary Care Jonaya Freshour: [name] Other Clinician: Referring Wai Litt: Treating Flo Berroa/Extender: [name] Weeks in Treatment: 0 Wound Status Wound Number: 5 Primary Pressure Ulcer Etiology: Wound Location: Left Metatarsal head fifth Wound Open Wounding Event: Pressure Injury Status: Date Acquired: 06/05/2021 Comorbid Cataracts, Chronic Obstructive Pulmonary Disease (COPD), Weeks Of Treatment: 0 History: Hypertension, Type II Diabetes, Osteoarthritis, Seizure Disorder Clustered Wound: No Photos Wound Measurements Length: (cm) 1 Width: (cm) 1.5 Depth: (cm) 0.2 Area: (cm) 1.178 Volume: (cm) 0.236 % Reduction in Area: 0% % Reduction in Volume: 0% Epithelialization: None Tunneling: No Undermining: No Wound Description Classification: Category/Stage III Wound Margin: Distinct, outline attached Exudate Amount: Medium Exudate Type: Purulent Exudate Color: yellow, brown, green Foul Odor After Cleansing: No Slough/Fibrino Yes Wound Bed Granulation Amount: Small (1-33%) Exposed Structure Granulation Quality: Red Fascia Exposed: No Necrotic Amount: Large (67-100%) Fat Layer (Subcutaneous Tissue) Exposed: Yes Necrotic Quality: Adherent Slough Tendon Exposed: No Muscle Exposed: No Joint Exposed: No Bone Exposed: No Electronic Signature(s) Signed: 06/26/2021 5:30:54 PM By: Lorrin Jackson Entered By: Lorrin Jackson on 06/26/2021 14:19:01 -------------------------------------------------------------------------------- Wound Assessment Details Patient Name: Date of Service: Crystal Moore. 06/26/2021 1:15 PM Medical Record Number: [id number] Patient Account Number: [id number] Date of Birth/Sex: Treating RN: Aug 31, 1945 (75 y.o. [name] Primary Care Dennys Traughber: [name] Other Clinician: Referring Kristina Bertone: Treating Rosealynn Mateus/Extender: [name] Weeks in Treatment: 0 Wound Status Wound Number: 6 Primary Pressure Ulcer Etiology: Wound Location: Left, Lateral Ankle Wound Open Wounding Event:  Pressure Injury Status: Date Acquired: 03/27/2020 Comorbid Cataracts, Chronic Obstructive Pulmonary Disease (COPD), Weeks Of Treatment: 0 History: Hypertension, Type II Diabetes, Osteoarthritis, Seizure Disorder Clustered Wound: No Photos Wound Measurements Length: (cm) 1.6 Width: (cm) 1 Depth: (cm) 0.3 Area: (cm) 1.257 Volume: (cm) 0.377 % Reduction in Area: 0% % Reduction in Volume: 0% Epithelialization: None Tunneling: No Undermining: No Wound Description Classification: Category/Stage III Wound Margin: Distinct, outline attached Exudate Amount: Medium Exudate Type: Serosanguineous Exudate Color: red, brown Foul Odor After Cleansing: No Slough/Fibrino Yes Wound Bed Granulation Amount: Large (67-100%) Exposed Structure Granulation Quality: Red Fascia Exposed: No Necrotic Amount: Small (1-33%) Fat Layer (Subcutaneous Tissue) Exposed: Yes Necrotic Quality: Adherent Slough Tendon Exposed: No Muscle Exposed: No Joint Exposed: No Bone Exposed: No Treatment Notes Wound #6 (Ankle) Wound Laterality: Left, Lateral Cleanser Soap and Water Discharge Instruction: May shower and wash wound with dial antibacterial soap and water prior to dressing change. Wound Cleanser Discharge Instruction: Cleanse  the wound with wound cleanser prior to applying a clean dressing using gauze sponges, not tissue or cotton balls. Peri-Wound Care Topical Primary Dressing Santyl Ointment Discharge Instruction: Apply nickel thick amount to wound bed as instructed Secondary Dressing Woven Gauze Sponges 2x2 in Discharge Instruction: Apply over primary dressing as directed. Zetuvit Plus Silicone Border Dressing 4x4 (in/in) Discharge Instruction: Apply silicone border over primary dressing as directed. Secured With Compression Wrap Compression Stockings Environmental education officer) Signed: 06/26/2021 5:30:54 PM By: Lorrin Jackson Entered By: Lorrin Jackson on 06/26/2021  14:19:44 -------------------------------------------------------------------------------- Vitals Details Patient Name: Date of Service: Crystal Moore. 06/26/2021 1:15 PM Medical Record Number: [id number] Patient Account Number: [id number] Date of Birth/Sex: Treating RN: 11/02/1945 (75 y.o. [name] Primary Care Everhett Bozard: [name] Other Clinician: Referring Noreene Boreman: Treating Shameeka Silliman/Extender: [name] Weeks in Treatment: 0 Vital Signs Time Taken: 13:58 Temperature (F): 99 Pulse (bpm): 48 Respiratory Rate (breaths/min): 18 Blood Pressure (mmHg): 171/70 Reference Range: 80 - 120 mg / dl Electronic Signature(s) Signed: 06/26/2021 5:30:54 PM By: Lorrin Jackson Entered By: Lorrin Jackson on 06/26/2021 13:59:31

## 2021-06-27 ENCOUNTER — Emergency Department (HOSPITAL_COMMUNITY): Payer: Medicare Other

## 2021-06-27 ENCOUNTER — Inpatient Hospital Stay (HOSPITAL_COMMUNITY): Payer: Medicare Other

## 2021-06-27 ENCOUNTER — Inpatient Hospital Stay (HOSPITAL_COMMUNITY)
Admission: EM | Admit: 2021-06-27 | Discharge: 2021-06-30 | DRG: 871 | Disposition: A | Discharge status: Home Health | Payer: Medicare Other | Attending: Internal Medicine | Admitting: Internal Medicine

## 2021-06-27 DIAGNOSIS — I959 Hypotension, unspecified: Secondary | ICD-10-CM | POA: Diagnosis present

## 2021-06-27 DIAGNOSIS — I6503 Occlusion and stenosis of bilateral vertebral arteries: Secondary | ICD-10-CM | POA: Diagnosis not present

## 2021-06-27 DIAGNOSIS — R29810 Facial weakness: Secondary | ICD-10-CM | POA: Diagnosis not present

## 2021-06-27 DIAGNOSIS — Z87440 Personal history of urinary (tract) infections: Secondary | ICD-10-CM

## 2021-06-27 DIAGNOSIS — R569 Unspecified convulsions: Secondary | ICD-10-CM | POA: Diagnosis not present

## 2021-06-27 DIAGNOSIS — Z79899 Other long term (current) drug therapy: Secondary | ICD-10-CM

## 2021-06-27 DIAGNOSIS — A419 Sepsis, unspecified organism: Secondary | ICD-10-CM | POA: Diagnosis not present

## 2021-06-27 DIAGNOSIS — I70202 Unspecified atherosclerosis of native arteries of extremities, left leg: Secondary | ICD-10-CM | POA: Diagnosis present

## 2021-06-27 DIAGNOSIS — I10 Essential (primary) hypertension: Secondary | ICD-10-CM | POA: Diagnosis present

## 2021-06-27 DIAGNOSIS — Z8616 Personal history of COVID-19: Secondary | ICD-10-CM | POA: Diagnosis not present

## 2021-06-27 DIAGNOSIS — G40209 Localization-related (focal) (partial) symptomatic epilepsy and epileptic syndromes with complex partial seizures, not intractable, without status epilepticus: Secondary | ICD-10-CM | POA: Diagnosis present

## 2021-06-27 DIAGNOSIS — E1151 Type 2 diabetes mellitus with diabetic peripheral angiopathy without gangrene: Secondary | ICD-10-CM | POA: Diagnosis present

## 2021-06-27 DIAGNOSIS — J440 Chronic obstructive pulmonary disease with acute lower respiratory infection: Secondary | ICD-10-CM | POA: Diagnosis present

## 2021-06-27 DIAGNOSIS — R6889 Other general symptoms and signs: Secondary | ICD-10-CM | POA: Diagnosis not present

## 2021-06-27 DIAGNOSIS — I63542 Cerebral infarction due to unspecified occlusion or stenosis of left cerebellar artery: Secondary | ICD-10-CM | POA: Diagnosis present

## 2021-06-27 DIAGNOSIS — J9621 Acute and chronic respiratory failure with hypoxia: Secondary | ICD-10-CM | POA: Insufficient documentation

## 2021-06-27 DIAGNOSIS — T426X6A Underdosing of other antiepileptic and sedative-hypnotic drugs, initial encounter: Secondary | ICD-10-CM | POA: Diagnosis present

## 2021-06-27 DIAGNOSIS — R52 Pain, unspecified: Secondary | ICD-10-CM

## 2021-06-27 DIAGNOSIS — R413 Other amnesia: Secondary | ICD-10-CM

## 2021-06-27 DIAGNOSIS — R6520 Severe sepsis without septic shock: Secondary | ICD-10-CM

## 2021-06-27 DIAGNOSIS — Z95820 Peripheral vascular angioplasty status with implants and grafts: Secondary | ICD-10-CM

## 2021-06-27 DIAGNOSIS — I6623 Occlusion and stenosis of bilateral posterior cerebral arteries: Secondary | ICD-10-CM | POA: Diagnosis not present

## 2021-06-27 DIAGNOSIS — J1000 Influenza due to other identified influenza virus with unspecified type of pneumonia: Secondary | ICD-10-CM | POA: Diagnosis present

## 2021-06-27 DIAGNOSIS — R4781 Slurred speech: Secondary | ICD-10-CM | POA: Diagnosis not present

## 2021-06-27 DIAGNOSIS — I69354 Hemiplegia and hemiparesis following cerebral infarction affecting left non-dominant side: Secondary | ICD-10-CM | POA: Diagnosis not present

## 2021-06-27 DIAGNOSIS — Z794 Long term (current) use of insulin: Secondary | ICD-10-CM | POA: Diagnosis not present

## 2021-06-27 DIAGNOSIS — Z7902 Long term (current) use of antithrombotics/antiplatelets: Secondary | ICD-10-CM

## 2021-06-27 DIAGNOSIS — F0390 Unspecified dementia without behavioral disturbance: Secondary | ICD-10-CM | POA: Diagnosis present

## 2021-06-27 DIAGNOSIS — J101 Influenza due to other identified influenza virus with other respiratory manifestations: Secondary | ICD-10-CM

## 2021-06-27 DIAGNOSIS — Z7982 Long term (current) use of aspirin: Secondary | ICD-10-CM

## 2021-06-27 DIAGNOSIS — Z20822 Contact with and (suspected) exposure to covid-19: Secondary | ICD-10-CM | POA: Diagnosis present

## 2021-06-27 DIAGNOSIS — Z833 Family history of diabetes mellitus: Secondary | ICD-10-CM

## 2021-06-27 DIAGNOSIS — E1165 Type 2 diabetes mellitus with hyperglycemia: Secondary | ICD-10-CM

## 2021-06-27 DIAGNOSIS — I639 Cerebral infarction, unspecified: Secondary | ICD-10-CM | POA: Diagnosis not present

## 2021-06-27 DIAGNOSIS — G459 Transient cerebral ischemic attack, unspecified: Secondary | ICD-10-CM

## 2021-06-27 DIAGNOSIS — B9789 Other viral agents as the cause of diseases classified elsewhere: Secondary | ICD-10-CM | POA: Diagnosis present

## 2021-06-27 DIAGNOSIS — Z743 Need for continuous supervision: Secondary | ICD-10-CM | POA: Diagnosis not present

## 2021-06-27 DIAGNOSIS — R4701 Aphasia: Secondary | ICD-10-CM | POA: Diagnosis present

## 2021-06-27 DIAGNOSIS — L97329 Non-pressure chronic ulcer of left ankle with unspecified severity: Secondary | ICD-10-CM | POA: Diagnosis present

## 2021-06-27 DIAGNOSIS — A4189 Other specified sepsis: Secondary | ICD-10-CM | POA: Diagnosis present

## 2021-06-27 DIAGNOSIS — J9601 Acute respiratory failure with hypoxia: Secondary | ICD-10-CM

## 2021-06-27 DIAGNOSIS — J439 Emphysema, unspecified: Secondary | ICD-10-CM | POA: Diagnosis not present

## 2021-06-27 DIAGNOSIS — G319 Degenerative disease of nervous system, unspecified: Secondary | ICD-10-CM | POA: Diagnosis not present

## 2021-06-27 DIAGNOSIS — R404 Transient alteration of awareness: Secondary | ICD-10-CM | POA: Diagnosis not present

## 2021-06-27 DIAGNOSIS — F17210 Nicotine dependence, cigarettes, uncomplicated: Secondary | ICD-10-CM | POA: Diagnosis present

## 2021-06-27 DIAGNOSIS — I6603 Occlusion and stenosis of bilateral middle cerebral arteries: Secondary | ICD-10-CM | POA: Diagnosis not present

## 2021-06-27 DIAGNOSIS — R4182 Altered mental status, unspecified: Secondary | ICD-10-CM | POA: Diagnosis not present

## 2021-06-27 DIAGNOSIS — Z91138 Patient's unintentional underdosing of medication regimen for other reason: Secondary | ICD-10-CM

## 2021-06-27 DIAGNOSIS — Z993 Dependence on wheelchair: Secondary | ICD-10-CM

## 2021-06-27 DIAGNOSIS — I69319 Unspecified symptoms and signs involving cognitive functions following cerebral infarction: Secondary | ICD-10-CM

## 2021-06-27 DIAGNOSIS — M79672 Pain in left foot: Secondary | ICD-10-CM | POA: Diagnosis not present

## 2021-06-27 DIAGNOSIS — G9341 Metabolic encephalopathy: Secondary | ICD-10-CM | POA: Diagnosis present

## 2021-06-27 DIAGNOSIS — M1990 Unspecified osteoarthritis, unspecified site: Secondary | ICD-10-CM | POA: Diagnosis present

## 2021-06-27 DIAGNOSIS — I6523 Occlusion and stenosis of bilateral carotid arteries: Secondary | ICD-10-CM | POA: Diagnosis not present

## 2021-06-27 DIAGNOSIS — I6389 Other cerebral infarction: Secondary | ICD-10-CM | POA: Diagnosis not present

## 2021-06-27 DIAGNOSIS — Z7984 Long term (current) use of oral hypoglycemic drugs: Secondary | ICD-10-CM

## 2021-06-27 DIAGNOSIS — R509 Fever, unspecified: Secondary | ICD-10-CM | POA: Diagnosis not present

## 2021-06-27 LAB — RAPID URINE DRUG SCREEN, HOSP PERFORMED
Amphetamines: NOT DETECTED
Barbiturates: NOT DETECTED
Benzodiazepines: NOT DETECTED
Cocaine: NOT DETECTED
Opiates: NOT DETECTED
Tetrahydrocannabinol: NOT DETECTED

## 2021-06-27 LAB — I-STAT CHEM 8, ED
BUN: 23 mg/dL (ref 8–23)
Calcium, Ion: 1.04 mmol/L — ABNORMAL LOW (ref 1.15–1.40)
Chloride: 102 mmol/L (ref 98–111)
Creatinine, Ser: 1.1 mg/dL — ABNORMAL HIGH (ref 0.44–1.00)
Glucose, Bld: 147 mg/dL — ABNORMAL HIGH (ref 70–99)
HCT: 43 % (ref 36.0–46.0)
Hemoglobin: 14.6 g/dL (ref 12.0–15.0)
Potassium: 4.2 mmol/L (ref 3.5–5.1)
Sodium: 140 mmol/L (ref 135–145)
TCO2: 27 mmol/L (ref 22–32)

## 2021-06-27 LAB — CBC WITH DIFFERENTIAL/PLATELET
Abs Immature Granulocytes: 0.03 10*3/uL (ref 0.00–0.07)
Basophils Absolute: 0 10*3/uL (ref 0.0–0.1)
Basophils Relative: 0 %
Eosinophils Absolute: 0 10*3/uL (ref 0.0–0.5)
Eosinophils Relative: 0 %
HCT: 41.9 % (ref 36.0–46.0)
Hemoglobin: 13.2 g/dL (ref 12.0–15.0)
Immature Granulocytes: 0 %
Lymphocytes Relative: 22 %
Lymphs Abs: 2 10*3/uL (ref 0.7–4.0)
MCH: 25.9 pg — ABNORMAL LOW (ref 26.0–34.0)
MCHC: 31.5 g/dL (ref 30.0–36.0)
MCV: 82.2 fL (ref 80.0–100.0)
Monocytes Absolute: 1 10*3/uL (ref 0.1–1.0)
Monocytes Relative: 12 %
Neutro Abs: 5.8 10*3/uL (ref 1.7–7.7)
Neutrophils Relative %: 66 %
Platelets: 210 10*3/uL (ref 150–400)
RBC: 5.1 MIL/uL (ref 3.87–5.11)
RDW: 17.1 % — ABNORMAL HIGH (ref 11.5–15.5)
WBC: 8.9 10*3/uL (ref 4.0–10.5)
nRBC: 0 % (ref 0.0–0.2)

## 2021-06-27 LAB — COMPREHENSIVE METABOLIC PANEL
ALT: 28 U/L (ref 0–44)
AST: 37 U/L (ref 15–41)
Albumin: 2.5 g/dL — ABNORMAL LOW (ref 3.5–5.0)
Alkaline Phosphatase: 73 U/L (ref 38–126)
Anion gap: 9 (ref 5–15)
BUN: 17 mg/dL (ref 8–23)
CO2: 23 mmol/L (ref 22–32)
Calcium: 8 mg/dL — ABNORMAL LOW (ref 8.9–10.3)
Chloride: 103 mmol/L (ref 98–111)
Creatinine, Ser: 0.96 mg/dL (ref 0.44–1.00)
GFR, Estimated: >60 mL/min (ref >60)
Glucose, Bld: 200 mg/dL — ABNORMAL HIGH (ref 70–99)
Potassium: 3.6 mmol/L (ref 3.5–5.1)
Sodium: 135 mmol/L (ref 135–145)
Total Bilirubin: 0.6 mg/dL (ref 0.3–1.2)
Total Protein: 6.2 g/dL — ABNORMAL LOW (ref 6.5–8.1)

## 2021-06-27 LAB — GLUCOSE, CAPILLARY: Glucose-Capillary: 172 mg/dL — ABNORMAL HIGH (ref 70–99)

## 2021-06-27 LAB — APTT: aPTT: 23 seconds — ABNORMAL LOW (ref 24–36)

## 2021-06-27 LAB — RESP PANEL BY RT-PCR (FLU A&B, COVID) ARPGX2
Influenza A by PCR: POSITIVE — AB
Influenza B by PCR: NEGATIVE
SARS Coronavirus 2 by RT PCR: NEGATIVE

## 2021-06-27 LAB — ETHANOL: Alcohol, Ethyl (B): <10 mg/dL (ref <10)

## 2021-06-27 LAB — PROTIME-INR
INR: 1.1 (ref 0.8–1.2)
Prothrombin Time: 14.3 seconds (ref 11.4–15.2)

## 2021-06-27 LAB — CBG MONITORING, ED
Glucose-Capillary: 153 mg/dL — ABNORMAL HIGH (ref 70–99)
Glucose-Capillary: 138 mg/dL — ABNORMAL HIGH (ref 70–99)

## 2021-06-27 LAB — HEMOGLOBIN A1C
Hgb A1c MFr Bld: 8.7 % — ABNORMAL HIGH (ref 4.8–5.6)
Mean Plasma Glucose: 202.99 mg/dL

## 2021-06-27 LAB — LACTIC ACID, PLASMA: Lactic Acid, Venous: 1.7 mmol/L (ref 0.5–1.9)

## 2021-06-27 MED ORDER — OSELTAMIVIR PHOSPHATE 30 MG PO CAPS
30.0000 mg | ORAL_CAPSULE | Freq: Two times a day (BID) | ORAL | Status: DC
  Administered 2021-06-27 – 2021-06-30 (×7): 30 mg via ORAL
  Filled 2021-06-27 (×9): qty 1

## 2021-06-27 MED ORDER — VALPROIC ACID 250 MG PO CAPS
250.0000 mg | ORAL_CAPSULE | Freq: Three times a day (TID) | ORAL | Status: DC
  Administered 2021-06-27 – 2021-06-30 (×8): 250 mg via ORAL
  Filled 2021-06-27 (×10): qty 1

## 2021-06-27 MED ORDER — SODIUM CHLORIDE 0.9 % IV SOLN: INTRAVENOUS | Status: DC

## 2021-06-27 MED ORDER — ACETAMINOPHEN 160 MG/5ML PO SOLN: 650.0000 mg | ORAL | Status: DC | PRN

## 2021-06-27 MED ORDER — VALPROIC ACID 250 MG/5ML PO SOLN
750.0000 mg | ORAL | Status: AC
  Administered 2021-06-27: 750 mg via ORAL
  Filled 2021-06-27: qty 15

## 2021-06-27 MED ORDER — ALBUTEROL SULFATE HFA 108 (90 BASE) MCG/ACT IN AERS: 1.0000 | INHALATION_SPRAY | Freq: Four times a day (QID) | RESPIRATORY_TRACT | Status: DC | PRN

## 2021-06-27 MED ORDER — ACETAMINOPHEN 325 MG PO TABS
650.0000 mg | ORAL_TABLET | ORAL | Status: DC | PRN
  Administered 2021-06-29: 16:00:00 650 mg via ORAL
  Filled 2021-06-27 (×2): qty 2

## 2021-06-27 MED ORDER — ASPIRIN 81 MG PO TBEC: 81.0000 mg | DELAYED_RELEASE_TABLET | Freq: Every day | ORAL | Status: DC

## 2021-06-27 MED ORDER — ALBUTEROL SULFATE (2.5 MG/3ML) 0.083% IN NEBU: 2.5000 mg | INHALATION_SOLUTION | RESPIRATORY_TRACT | Status: DC | PRN

## 2021-06-27 MED ORDER — CLOPIDOGREL BISULFATE 75 MG PO TABS
75.0000 mg | ORAL_TABLET | Freq: Every day | ORAL | Status: DC
  Administered 2021-06-28 – 2021-06-30 (×3): 75 mg via ORAL
  Filled 2021-06-27 (×3): qty 1

## 2021-06-27 MED ORDER — LACTATED RINGERS IV BOLUS
1000.0000 mL | Freq: Once | INTRAVENOUS | Status: AC
  Administered 2021-06-27: 1000 mL via INTRAVENOUS

## 2021-06-27 MED ORDER — GABAPENTIN 300 MG PO CAPS
300.0000 mg | ORAL_CAPSULE | Freq: Three times a day (TID) | ORAL | Status: DC
  Administered 2021-06-27 – 2021-06-30 (×9): 300 mg via ORAL
  Filled 2021-06-27 (×9): qty 1

## 2021-06-27 MED ORDER — LACTATED RINGERS IV SOLN: INTRAVENOUS | Status: AC

## 2021-06-27 MED ORDER — STROKE: EARLY STAGES OF RECOVERY BOOK
Freq: Once | Status: AC
  Filled 2021-06-27: qty 1

## 2021-06-27 MED ORDER — LACTATED RINGERS IV BOLUS (SEPSIS)
1000.0000 mL | Freq: Once | INTRAVENOUS | Status: AC
  Administered 2021-06-27: 1000 mL via INTRAVENOUS

## 2021-06-27 MED ORDER — SODIUM CHLORIDE 0.9 % IV SOLN
500.0000 mg | INTRAVENOUS | Status: DC
  Administered 2021-06-27: 500 mg via INTRAVENOUS
  Filled 2021-06-27: qty 500

## 2021-06-27 MED ORDER — ACETAMINOPHEN 500 MG PO TABS
1000.0000 mg | ORAL_TABLET | Freq: Four times a day (QID) | ORAL | Status: DC | PRN
Start: 2021-06-27 — End: 2021-06-27

## 2021-06-27 MED ORDER — LINAGLIPTIN 5 MG PO TABS
5.0000 mg | ORAL_TABLET | Freq: Every day | ORAL | Status: DC
  Administered 2021-06-27 – 2021-06-30 (×4): 5 mg via ORAL
  Filled 2021-06-27 (×4): qty 1

## 2021-06-27 MED ORDER — ACETAMINOPHEN 650 MG RE SUPP: 650.0000 mg | RECTAL | Status: DC | PRN

## 2021-06-27 MED ORDER — INSULIN ASPART 100 UNIT/ML IJ SOLN
0.0000 [IU] | Freq: Three times a day (TID) | INTRAMUSCULAR | Status: DC
  Administered 2021-06-27: 1 [IU] via SUBCUTANEOUS
  Administered 2021-06-28: 2 [IU] via SUBCUTANEOUS
  Administered 2021-06-29: 13:00:00 1 [IU] via SUBCUTANEOUS

## 2021-06-27 MED ORDER — PANTOPRAZOLE SODIUM 40 MG PO TBEC
40.0000 mg | DELAYED_RELEASE_TABLET | Freq: Two times a day (BID) | ORAL | Status: DC
  Administered 2021-06-27 – 2021-06-30 (×6): 40 mg via ORAL
  Filled 2021-06-27 (×6): qty 1

## 2021-06-27 MED ORDER — HEPARIN SODIUM (PORCINE) 5000 UNIT/ML IJ SOLN
5000.0000 [IU] | Freq: Two times a day (BID) | INTRAMUSCULAR | Status: DC
  Administered 2021-06-27 – 2021-06-30 (×7): 5000 [IU] via SUBCUTANEOUS
  Filled 2021-06-27 (×8): qty 1

## 2021-06-27 MED ORDER — INSULIN GLARGINE-YFGN 100 UNIT/ML ~~LOC~~ SOLN
10.0000 [IU] | Freq: Every day | SUBCUTANEOUS | Status: DC
  Administered 2021-06-27 – 2021-06-29 (×3): 10 [IU] via SUBCUTANEOUS
  Filled 2021-06-27 (×4): qty 0.1

## 2021-06-27 MED ORDER — ENSURE ENLIVE PO LIQD
237.0000 mL | Freq: Two times a day (BID) | ORAL | Status: DC
  Administered 2021-06-28 – 2021-06-30 (×2): 237 mL via ORAL

## 2021-06-27 MED ORDER — ATORVASTATIN CALCIUM 80 MG PO TABS
80.0000 mg | ORAL_TABLET | Freq: Every day | ORAL | Status: DC
  Administered 2021-06-27 – 2021-06-30 (×4): 80 mg via ORAL
  Filled 2021-06-27: qty 2
  Filled 2021-06-27 (×3): qty 1

## 2021-06-27 MED ORDER — ACETAMINOPHEN 500 MG PO TABS
1000.0000 mg | ORAL_TABLET | Freq: Once | ORAL | Status: AC
  Administered 2021-06-27: 1000 mg via ORAL
  Filled 2021-06-27: qty 2

## 2021-06-27 MED ORDER — SODIUM CHLORIDE 0.9 % IV SOLN
2.0000 g | INTRAVENOUS | Status: DC
  Administered 2021-06-27: 2 g via INTRAVENOUS
  Filled 2021-06-27: qty 20

## 2021-06-27 MED ORDER — SENNOSIDES-DOCUSATE SODIUM 8.6-50 MG PO TABS: 1.0000 | ORAL_TABLET | Freq: Every evening | ORAL | Status: DC | PRN

## 2021-06-27 MED ORDER — IOHEXOL 350 MG/ML SOLN
60.0000 mL | Freq: Once | INTRAVENOUS | Status: AC | PRN
  Administered 2021-06-27: 60 mL via INTRAVENOUS

## 2021-06-27 NOTE — Progress Notes (Signed)
MRI came back positive for acute punctuate stroke in left cerebellum, D/W daughter and patient at bedside, daughter reported that patient "had serious stomach bleeding with the lowest aspirin while on plavix before".   Will continue Plavix for now, not starting aspirin.

## 2021-06-27 NOTE — Progress Notes (Signed)
EEG done at bedside. Results pending. No skin breakdown noted.

## 2021-06-27 NOTE — Plan of Care (Signed)
  Problem: Education: Goal: Knowledge of disease or condition will improve Outcome: Progressing Goal: Knowledge of secondary prevention will improve (SELECT ALL) Outcome: Progressing Goal: Knowledge of patient specific risk factors will improve (INDIVIDUALIZE FOR PATIENT) Outcome: Progressing Goal: Individualized Educational Video(s) Outcome: Progressing

## 2021-06-27 NOTE — Consult Note (Signed)
[facility] Nurse Consult Note: Patient receiving care in [facility] [place]. Reason for Consult: Left ankle wound Wound type: chronic left lateral malleolus wound Pressure Injury POA: Yes Measurement: 1 cm x 1 cm x 0.2 cm Wound bed: 100% pink Drainage (amount, consistency, odor) none Periwound: intact Dressing procedure/placement/frequency: Wash left lateral ankle wound with soap and water. Place a small foam dressing over the area. Change the foam dressing every 3 days and prn.   Also, I have put in an order for bilateral prevalon heel lift boots, Kellie Simmering [phone].  Monitor the wound area(s) for worsening of condition such as: Signs/symptoms of infection,  Increase in size,  Development of or worsening of odor, Development of pain, or increased pain at the affected locations.  Notify the medical team if any of these develop.  Thank you for the consult. [facility] nurse will not follow at this time.  Please re-consult the [facility] team if needed.  Val Riles, RN, MSN, CWOCN, CNS-BC, pager [phone]

## 2021-06-27 NOTE — H&P (Addendum)
History and Physical    Crystal Moore GGE:[id number] DOB: 07/27/1946 DOA: 06/27/2021  PCP: [name], MD (Confirm with patient/family/NH records and if not entered, this has to be entered at [facility] point of entry) Patient coming from: Home  I have personally briefly reviewed patient's old medical records in [place]  Chief Complaint: Feeling ok  HPI: Crystal Moore is a 75 y.o. female with medical history significant of PVD with B/L leg stenting on ASA and Plavix, IDDM, seizure off anti-seizure meds, Stroke with residue left sided weakness, chronic ambulation dysfunction,COPD came with fever and stoke like symptoms.  Patient has a baseline left-sided weakness from her stroke before. Grandson at bedside reported that patient was noticed to have a new left facial droop around Thanksgiving, and was wondering "whether she had a  stroke".  This morning, patient was noticed to have slurred speech and worsening of left-sided facial droop.  Grandson reported that yesterday patient developed a fever 101 degrees and was given Tylenol at home.  He denied patient has any breathing symptoms started shortness of breath or cough.  Family did not witnessed any diarrhea and patient denied any urinary symptoms.  At baseline, patient not active, has complained about left foot pain " for several month" and she also has 2 wounds on left ankle and left foot has been followed by wound care 2 times a week.  Patient was diagnosed with absent-minded seizure and was on antiseizure medication until about 3 years ago.  Grandson also reported the patient was not "as sharp as usual" but no significant seizure activity or loss of consciousness.  ED Course: Spiking fever 102 degrees, blood pressure running low, responded to fourth liters of IV boluses. Influenza positive. Mild hypoxia O2 saturation 88% room air, patient was given ceftriaxone and azithromycin before influenza resulted.  CT head showed chronic  microvascular disease, CT angiogram moderate stenosis within the cavernous right ICA moderate/severe stenosis within the distal petrous/proximal venous left ICA redemonstrated severe stenosis within mid to distal M2 moderate stenosis within the superior division of proximal M2.  Review of Systems: As per HPI otherwise 14 point review of systems negative.    Past Medical History:  Diagnosis Date   Arthritis    COPD (chronic obstructive pulmonary disease) ([place])    COVID-19 virus infection 07/2020   CVA (cerebral vascular accident) ([place])    Diabetes mellitus without complication ([place])    Hemiparesis affecting left side as late effect of cerebrovascular accident (CVA) ([place])    Pyelonephritis    Seizures ([place])    Stroke ([place])    Tobacco use disorder    UTI (urinary tract infection)     Past Surgical History:  Procedure Laterality Date   ABDOMINAL AORTOGRAM W/LOWER EXTREMITY N/A 08/30/2020   Procedure: ABDOMINAL AORTOGRAM W/LOWER EXTREMITY;  Surgeon: Cherre Robins, MD;  Location: [facility];  Service: Cardiovascular;  Laterality: N/A;   ABDOMINAL AORTOGRAM W/LOWER EXTREMITY N/A 09/04/2020   Procedure: ABDOMINAL AORTOGRAM W/LOWER EXTREMITY;  Surgeon: Cherre Robins, MD;  Location: [facility];  Service: Cardiovascular;  Laterality: N/A;   ESOPHAGOGASTRODUODENOSCOPY (EGD) WITH PROPOFOL N/A 09/16/2020   Procedure: ESOPHAGOGASTRODUODENOSCOPY (EGD) WITH PROPOFOL;  Surgeon: Lavena Bullion, DO;  Location: [facility];  Service: Gastroenterology;  Laterality: N/A;   HEMOSTASIS CLIP PLACEMENT  09/16/2020   Procedure: HEMOSTASIS CLIP PLACEMENT;  Surgeon: Lavena Bullion, DO;  Location: [facility];  Service: Gastroenterology;;   HOT HEMOSTASIS N/A 09/16/2020   Procedure: HOT  HEMOSTASIS (ARGON PLASMA COAGULATION/BICAP);  Surgeon: Lavena Bullion, DO;  [facility]: [facility];  Service: Gastroenterology;  Laterality: N/A;   PERIPHERAL VASCULAR INTERVENTION Right 08/30/2020    Procedure: PERIPHERAL VASCULAR INTERVENTION;  Surgeon: Cherre Robins, MD;  Location: [facility];  Service: Cardiovascular;  Laterality: Right;  femoral popliteal   PERIPHERAL VASCULAR INTERVENTION Left 09/04/2020   Procedure: PERIPHERAL VASCULAR INTERVENTION;  Surgeon: Cherre Robins, MD;  Location: [facility];  Service: Cardiovascular;  Laterality: Left;  SFA   TUBAL LIGATION       reports that she has been smoking cigarettes. She has a 40.00 pack-year smoking history. She has never used smokeless tobacco. She reports that she does not drink alcohol and does not use drugs.  No Known Allergies  Family History  Problem Relation Age of Onset   Diabetes Mother    Diabetes Sister    Diabetes Brother      Prior to Admission medications   Medication Sig Start Date End Date Taking? Authorizing Provider  ACCU-CHEK GUIDE test strip USE TO test AS DIRECTED TWICE DAILY 05/23/21   Renato Shin, MD  acetaminophen (TYLENOL) 500 MG tablet Take 1,000 mg by mouth every 6 (six) hours as needed (pain).    [provider]  albuterol (PROVENTIL) (2.5 MG/3ML) 0.083% nebulizer solution Inhale 3 mLs (2.5 mg total) into the lungs every 4 (four) hours as needed for wheezing or shortness of breath. 01/17/19   Eulogio Bear U, DO  albuterol (VENTOLIN HFA) 108 (90 Base) MCG/ACT inhaler Inhale 1 puff into the lungs every 6 (six) hours as needed for wheezing or shortness of breath.    [provider]  aspirin 81 MG EC tablet Take 81 mg by mouth daily. 11/07/15   [provider]  atorvastatin (LIPITOR) 80 MG tablet Take 1 tablet (80 mg total) by mouth daily at 6 PM. Patient taking differently: Take 80 mg by mouth daily. 12/21/18   Cristal Ford, DO  clopidogrel (PLAVIX) 75 MG tablet Take 1 tablet (75 mg total) by mouth daily. 09/24/20   Cherre Robins, MD  feeding supplement (ENSURE ENLIVE / ENSURE PLUS) LIQD Take 237 mLs by mouth 2 (two) times daily between meals. 06/28/20    Renato Shin, MD  gabapentin (NEURONTIN) 300 MG capsule Take 1 capsule (300 mg total) by mouth 3 (three) times daily. 09/06/20   Hosie Poisson, MD  insulin glargine (LANTUS) 100 UNIT/ML injection Inject 0.15 mLs (15 Units total) into the skin daily. 09/18/20   Terrilee Croak, MD  linagliptin (TRADJENTA) 5 MG TABS tablet Take 5 mg by mouth daily.    [provider]  pantoprazole (PROTONIX) 40 MG tablet Take 1 tablet (40 mg total) by mouth 2 (two) times daily. 09/18/20 10/30/20  Terrilee Croak, MD    Physical Exam: Vitals:   06/27/21 1215 06/27/21 1330 06/27/21 1339 06/27/21 1345  BP: (!) 90/54 (!) 87/77  (!) 82/44  Pulse: 83 75  65  Resp:  13  17  Temp:   98.5 F (36.9 C)   TempSrc:   Oral   SpO2: 99% 100%  99%  Weight:        Constitutional: NAD, calm, comfortable Vitals:   06/27/21 1215 06/27/21 1330 06/27/21 1339 06/27/21 1345  BP: (!) 90/54 (!) 87/77  (!) 82/44  Pulse: 83 75  65  Resp:  13  17  Temp:   98.5 F (36.9 C)   TempSrc:   Oral   SpO2: 99%  100%  99%  Weight:       Eyes: PERRL, lids and conjunctivae normal ENMT: Mucous membranes are dry. Posterior pharynx clear of any exudate or lesions.Normal dentition.  Neck: normal, supple, no masses, no thyromegaly Respiratory: clear to auscultation bilaterally, no wheezing, no crackles. Normal respiratory effort. No accessory muscle use.  Cardiovascular: Regular rate and rhythm, no murmurs / rubs / gallops. No extremity edema. 2+ pedal pulses. No carotid bruits.  Abdomen: no tenderness, no masses palpated. No hepatosplenomegaly. Bowel sounds positive.  Musculoskeletal: no clubbing / cyanosis. No joint deformity upper and lower extremities. Good ROM, no contractures. Normal muscle tone.  Skin: Left lateral ankle and lateral foot small ulcers shallow, clean-based Neurologic: Left-sided facial droop and tongue deviation. Sensation intact, DTR normal. Strength 4/5 on the left side compared to 5/5 on the right which is chronic.   Psychiatric: Normal judgment and insight. Alert and oriented x 3. Normal mood.     Labs on Admission: I have personally reviewed following labs and imaging studies  CBC: Recent Labs  Lab 06/27/21 0951 06/27/21 1050  WBC  --  8.9  NEUTROABS  --  5.8  HGB 14.6 13.2  HCT 43.0 41.9  MCV  --  82.2  PLT  --  209   Basic Metabolic Panel: Recent Labs  Lab 06/27/21 0951  NA 140  K 4.2  CL 102  GLUCOSE 147*  BUN 23  CREATININE 1.10*   GFR: Estimated Creatinine Clearance: 39.8 mL/min (A) (by C-G formula based on SCr of 1.1 mg/dL (H)). Liver Function Tests: No results for input(s): AST, ALT, ALKPHOS, BILITOT, PROT, ALBUMIN in the last 168 hours. No results for input(s): LIPASE, AMYLASE in the last 168 hours. No results for input(s): AMMONIA in the last 168 hours. Coagulation Profile: Recent Labs  Lab 06/27/21 1050  INR 1.1   Cardiac Enzymes: No results for input(s): CKTOTAL, CKMB, CKMBINDEX, TROPONINI in the last 168 hours. BNP (last 3 results) No results for input(s): PROBNP in the last 8760 hours. HbA1C: No results for input(s): HGBA1C in the last 72 hours. CBG: Recent Labs  Lab 06/27/21 0945  GLUCAP 153*   Lipid Profile: No results for input(s): CHOL, HDL, LDLCALC, TRIG, CHOLHDL, LDLDIRECT in the last 72 hours. Thyroid Function Tests: No results for input(s): TSH, T4TOTAL, FREET4, T3FREE, THYROIDAB in the last 72 hours. Anemia Panel: No results for input(s): VITAMINB12, FOLATE, FERRITIN, TIBC, IRON, RETICCTPCT in the last 72 hours. Urine analysis:    Component Value Date/Time   COLORURINE STRAW (A) 01/30/2021 1908   APPEARANCEUR CLEAR 01/30/2021 1908   LABSPEC 1.024 01/30/2021 1908   PHURINE 6.0 01/30/2021 1908   GLUCOSEU >=500 (A) 01/30/2021 1908   HGBUR NEGATIVE 01/30/2021 1908   BILIRUBINUR NEGATIVE 01/30/2021 1908   KETONESUR NEGATIVE 01/30/2021 1908   PROTEINUR NEGATIVE 01/30/2021 1908   NITRITE NEGATIVE 01/30/2021 1908   LEUKOCYTESUR NEGATIVE  01/30/2021 1908    Radiological Exams on Admission: DG Chest Port 1 View  Result Date: 06/27/2021 CLINICAL DATA:  Fever.  Stroke evaluation. EXAM: PORTABLE CHEST 1 VIEW COMPARISON:  Radiographs 09/14/2020 and 08/26/2020.  CT 02/15/2018. FINDINGS: 1113 hours. Patient is rotated to the left, and the extreme right lung apex is excluded. The heart size and mediastinal contours are stable with aortic atherosclerosis. Interstitial prominence has increased, suspicious for increased pulmonary edema. There is no confluent airspace opacity, significant pleural effusion or pneumothorax. The bones appear unchanged. Multiple telemetry leads overlie the chest. IMPRESSION: Interval diffusely increased interstitial prominence suspicious  for interstitial pulmonary edema. Electronically Signed   By: Richardean Sale M.D.   On: 06/27/2021 11:34   EEG adult  Result Date: 06/27/2021 Lora Havens, MD     06/27/2021  1:02 PM Patient Name: KANDIS HENRY MRN: [id number] Epilepsy Attending: Lora Havens Referring Physician/Provider: Dr Kerney Elbe Date: 06/27/2021 Duration: 36.34 mins Patient history: 75 year old female with chronic left sided weakness secondary to prior stroke, presenting as a Code Stroke with acute onset of mutism, left facial droop and left sided drooling.  EEG to evaluate for seizure. Level of alertness: Awake AEDs during EEG study: Technical aspects: This EEG study was done with scalp electrodes positioned according to the 10-20 International system of electrode placement. Electrical activity was acquired at a sampling rate of 500Hz  and reviewed with a high frequency filter of 70Hz  and a low frequency filter of 1Hz . EEG data were recorded continuously and digitally stored. Description: The posterior dominant rhythm consists of 8 Hz activity of moderate voltage (25-35 uV) seen predominantly in posterior head regions, symmetric and reactive to eye opening and eye closing. Drowsiness was characterized by  attenuation of the posterior background rhythm. EEG showed intermittent generalized and maximal right frontal region 3 to 6 Hz theta-delta slowing.  Spikes were also noted in the right hemisphere, maximal right frontal region which at times appeared quasiperiodic at 1 Hz. Hyperventilation and photic stimulation were not performed.   ABNORMALITY - Spike, right hemisphere, maximal right frontal region - Intermittent slow, generalized and maximal right frontal region IMPRESSION: This study showed evidence of epileptogenicity and cortical dysfunction arising from right hemisphere, maximal right frontal region likely secondary to underlying structural abnormality and high risk for seizure recurrence . Additionally EEG suggestive of mild diffuse encephalopathy, nonspecific etiology.  No seizures were seen throughout the recording. Dr Cheral Marker was notified Lora Havens   CT HEAD CODE STROKE WO CONTRAST  Result Date: 06/27/2021 CLINICAL DATA:  Code stroke. Neuro deficit, acute, stroke suspected. Additional history provided: Left-sided deficit, slurred speech, nonambulatory. EXAM: CT HEAD WITHOUT CONTRAST TECHNIQUE: Contiguous axial images were obtained from the base of the skull through the vertex without intravenous contrast. COMPARISON:  Head CT 01/30/2021 and earlier. Brain MRI 01/03/2019. MRA head 01/14/2019. FINDINGS: Brain: Mild generalized cerebral and cerebellar atrophy. Redemonstrated chronic right ACA territory cortical/subcortical infarct within the medial right frontal lobe. Redemonstrated chronic infarcts within the bilateral cerebral hemispheric white matter, deep gray nuclei, posterior limb of right internal capsule and right midbrain. Background advanced patchy and confluent hypoattenuation within the cerebral white matter, nonspecific but compatible with chronic small vessel ischemic disease. Redemonstrated small chronic infarcts within the bilateral cerebellar hemispheres. There is no acute  intracranial hemorrhage. No acute demarcated cortical infarct is identified. No extra-axial fluid collection. No evidence of an intracranial mass. No midline shift. Vascular: No hyperdense vessel.  Atherosclerotic calcifications. Skull: Normal. Negative for fracture or focal lesion. Sinuses/Orbits: Visualized orbits show no acute finding. mild mucosal thickening within the bilateral ethmoid and left maxillary sinuses at the imaged levels. ASPECTS [facility] Stroke Program Early CT Score) - Ganglionic level infarction (caudate, lentiform nuclei, internal capsule, insula, M1-M3 cortex): 7 - Supraganglionic infarction (M4-M6 cortex): 3 Total score (0-10 with 10 being normal): 10 These results were communicated to Dr. Cheral Marker at 10:14amon 12/2/2022by text page via the [facility] messaging system. IMPRESSION: Motion degraded exam. No acute intracranial abnormality is identified.  ASPECTS is 10. Redemonstrated parenchymal atrophy, chronic ischemic changes and chronic infarcts, as detailed. Electronically Signed  By: Kellie Simmering D.O.   On: 06/27/2021 10:17   CT ANGIO HEAD NECK W WO CM (CODE STROKE)  Result Date: 06/27/2021 CLINICAL DATA:  Neuro deficit, acute, stroke suspected. Aside deficit, slurred speech, nonambulatory. EXAM: CT ANGIOGRAPHY HEAD AND NECK TECHNIQUE: Multidetector CT imaging of the head and neck was performed using the standard protocol during bolus administration of intravenous contrast. Multiplanar CT image reconstructions and MIPs were obtained to evaluate the vascular anatomy. Carotid stenosis measurements (when applicable) are obtained utilizing NASCET criteria, using the distal internal carotid diameter as the denominator. CONTRAST:  47mL OMNIPAQUE IOHEXOL 350 MG/ML SOLN COMPARISON:  Noncontrast head CT performed earlier today 06/27/2021. MRA head 01/14/2019. CT angiogram head/neck 12/18/2018. Chest CT 02/15/2018 FINDINGS: CTA NECK FINDINGS Aortic arch: Common origin of the innominate and left common  carotid arteries. Atherosclerotic plaque within the visualized aortic arch and proximal major branch vessels of the neck. Streak and beam hardening artifact arising from a dense right-sided contrast bolus partially obscures the right subclavian artery. Within this is a shin, no hemodynamically significant stenosis is identified within the innominate or proximal right subclavian artery. Atherosclerotic plaque within the left subclavian artery results in up to 50% stenosis, unchanged. Right carotid system: CCA and ICA patent within the neck. Prominent soft and calcified plaque within the carotid bifurcation and proximal ICA. Resultant severe stenosis at the origin of the right ICA estimated at 80%, slightly progressed as compared to the prior examination of 12/18/2018. Distal to this, the ICA is patent within the neck without stenosis. Left carotid system: CCA and ICA patent within the neck. Scattered soft and calcified plaque within the left carotid system. Most notably, there is prominent soft calcified plaque within the carotid bifurcation and proximal ICA. Estimated stenosis at the origin of the left ICA of 90%, progressed. Distal to this, the ICA is patent within the neck without stenosis. Vertebral arteries: The dominant right vertebral artery is patent within the neck. Unchanged mild atherosclerotic narrowing at the origin of this vessel. As before, the non dominant left vertebral artery is occluded within the neck to the C5 level. Distal to this, there is irregular and faint opacification of the left vertebral artery, likely due to retrograde flow. Skeleton: Cervical spondylosis. No acute bony abnormality or aggressive osseous lesion. Other neck: Subcentimeter nodules within the thyroid gland, not meeting consensus criteria for ultrasound follow-up based on size. No neck mass or cervical lymphadenopathy. Upper chest: Redemonstrated findings of fibrotic lung disease. New from prior examinations, there is an  irregular opacity within the right upper lobe measuring 2.9 cm. Also new from prior exams, and irregular opacity is present within the left upper lobe measuring up to 4.5 cm in greatest dimension. These findings may be infectious/inflammatory, but are suspicious for possible neoplasm. Review of the MIP images confirms the above findings CTA HEAD FINDINGS Anterior circulation: The intracranial internal carotid arteries are patent. Calcified plaque within both vessels. Up to moderate stenosis within the cavernous right ICA. Moderate/severe stenosis within the distal petrous/proximal cavernous left ICA, progressed. The M1 middle cerebral arteries are patent. Atherosclerotic irregularity of the M2 and more distal middle cerebral artery vessels, bilaterally, most notably as follows. Redemonstrated severe stenosis within a mid to distal M2 right MCA vessel (series 10, image 17). Redemonstrated moderate stenosis within a superior division proximal M2 left MCA vessel. Sites of progressive severe stenosis within a distal M2 left MCA vessel. The anterior cerebral arteries are patent. Redemonstrated mild to moderate stenosis within the right A1  segment. No intracranial aneurysm is identified. Posterior circulation: The dominant intracranial right vertebral artery is patent. Mild nonstenotic calcified plaque within this vessel. The intracranial left vertebral artery is developmentally diminutive with superimposed foci of high-grade stenosis. The basilar artery is patent. The posterior cerebral arteries are patent. Atherosclerotic irregularity of both vessels. Redemonstrated moderate stenosis within the P2 right PCA. Progressive severe stenosis within the right PCA P3 segment. Redemonstrated moderate stenosis within the P2 left PCA. Posterior communicating arteries are diminutive or absent bilaterally. Venous sinuses: Within the limitations of contrast timing, no convincing thrombus. Anatomic variants: As described. Review of  the MIP images confirms the above findings No emergent large vessel occlusion. These results were communicated to Dr. Cheral Marker At 10:59 amon 12/2/2022by text page via the [facility] messaging system. IMPRESSION: CTA neck: 1. The common carotid and internal carotid arteries are patent within the neck. Atherosclerotic stenosis at the origin of the right ICA estimated at 80%, progressed from the prior examination of 12/17/2020. Atherosclerotic stenosis at the origin of the left ICA estimated at 90%, also progressed. 2. The dominant right vertebral artery is patent within the neck with unchanged mild atherosclerotic narrowing at the origin of this vessel. 3. 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 Distal to this, there is irregular and faint opacification of the left vertebral artery, likely due to retrograde flow. 4. Irregular opacities within the bilateral upper lobes measuring up to 4.5 cm, new from prior examinations. While these findings may be infectious/inflammatory in etiology, they are suspicious for possible pulmonary neoplasm. A dedicated contrast-enhanced chest CT is recommended for initial further evaluation. CTA head: 1. No intracranial large vessel occlusion is identified. 2. Intracranial atherosclerotic disease with multifocal stenoses, most notably as follows. 3. Redemonstrated sites of up to moderate stenosis within the cavernous right ICA. 4. Moderate/severe stenosis within the distal petrous/proximal cavernous left ICA, progressed. 5. Redemonstrated severe stenosis within a mid to distal right M2 MCA vessel. 6. Redemonstrated moderate stenosis within a superior division proximal M2 left MCA vessel. 7. Sites of progressive severe stenosis within a distal M2 left MCA vessel. 8. Redemonstrated sites of severe stenosis within the V4 left vertebral artery. 9. Redemonstrated moderate stenosis within the P2 right PCA. 10. Progressive severe stenosis within the P3 right PCA.  11. Redemonstrated moderate stenosis within the P2 left PCA. Electronically Signed   By: Kellie Simmering D.O.   On: 06/27/2021 10:59    EKG: Independently reviewed.  Sinus tachycardia, no acute ST changes  Assessment/Plan Principal Problem:   Sepsis ([place])  (please populate well all problems here in Problem List. (For example, if patient is on BP meds at home and you resume or decide to hold them, it is a problem that needs to be her. Same for CAD, COPD, HLD and so on)  Sepsis -Evidenced by tachycardia, hypotension, question of encephalopathy at home mixed with CVA symptoms, source is influenza a infection. -On second liter of IV boluses, continue maintenance IV fluids. -Start renal dosed Tamiflu -Other possible infectious source: UA pending, left foot/ankle ulcer clean no signs of infection.  Acute hypoxic respite failure -Secondary to Influenza A pneumonia -Tamiflu -Breathing treatment.  CVA vs seizure -Most of her focal neurodeficit attributed to her baseline chronic left-sided residual weakness from last stroke, left-sided facial droop at least > 7 days according to family. -MRI for tomorrow -Continue Plavix and statin. Off ASA due to GI bleed -EEG pending.  IDDM -Resume Lantus 15 units at at bedtime,  add sliding scale -Continue Tradjenta  Left foot/ankle wound and chronic ambulation dysfunction -With a baseline PVD and stenting in leg, physical exam showed distant pulses bilaterally, will check x-ray of the left foot, PT evaluation. -Wound care consulted. -PT  COPD -Stable -Continue home breathing regimen  DVT prophylaxis: Heparin subcu Code Status: Full code Family Communication: Grandson at bedside Disposition Plan: Expect more than 2 midnight hospital stay to treat respiratory failure, stroke versus seizure work-up Consults called: Neurology Admission status: Telemetry admission   Lequita Halt MD [facility] Pager [phone]  06/27/2021, 1:55 PM

## 2021-06-27 NOTE — Consult Note (Addendum)
NEURO HOSPITALIST CONSULT NOTE   Requestig physician: Dr. Maryan Rued  Reason for Consult: Acute onset of mutism, left facial droop, left sided drooling and inability to ambulate on a baseline of chronic left sided weakness secondary to prior stroke  History obtained from:  EMS, Daughter and Chart     HPI:                                                                                                                                          Crystal Moore is an 75 y.o. female with a PMHx of COPD, DM, seizures, dementia, pyelonephritis, PVD, tobacco use, prior UTI, GIB, stent placement left femoral and left popliteal arteries, chronic left sided weakness secondary to prior stroke, who presents as a Code Stroke with acute onset of mutism, left facial droop and left sided drooling. The patient is wheelchair-bound at baseline.    She is not on a blood thinner. She takes Plavix, ASA and atorvastatin at home.   Past Medical History:  Diagnosis Date   Arthritis    COPD (chronic obstructive pulmonary disease) ([place])    COVID-19 virus infection 07/2020   CVA (cerebral vascular accident) ([place])    Diabetes mellitus without complication ([place])    Hemiparesis affecting left side as late effect of cerebrovascular accident (CVA) ([place])    Pyelonephritis    Seizures ([place])    Stroke ([place])    Tobacco use disorder    UTI (urinary tract infection)     Past Surgical History:  Procedure Laterality Date   ABDOMINAL AORTOGRAM W/LOWER EXTREMITY N/A 08/30/2020   Procedure: ABDOMINAL AORTOGRAM W/LOWER EXTREMITY;  Surgeon: Cherre Robins, MD;  Location: [place] [place] [facility];  Service: Cardiovascular;  Laterality: N/A;   ABDOMINAL AORTOGRAM W/LOWER EXTREMITY N/A 09/04/2020   Procedure: ABDOMINAL AORTOGRAM W/LOWER EXTREMITY;  Surgeon: Cherre Robins, MD;  Location: [facility];  Service: Cardiovascular;  Laterality: N/A;   ESOPHAGOGASTRODUODENOSCOPY (EGD) WITH PROPOFOL N/A 09/16/2020    Procedure: ESOPHAGOGASTRODUODENOSCOPY (EGD) WITH PROPOFOL;  Surgeon: Lavena Bullion, DO;  Location: [facility];  Service: Gastroenterology;  Laterality: N/A;   HEMOSTASIS CLIP PLACEMENT  09/16/2020   Procedure: HEMOSTASIS CLIP PLACEMENT;  Surgeon: Lavena Bullion, DO;  Location: [facility];  Service: Gastroenterology;;   HOT HEMOSTASIS N/A 09/16/2020   Procedure: HOT HEMOSTASIS (ARGON PLASMA COAGULATION/BICAP);  Surgeon: Lavena Bullion, DO;  Location: [facility];  Service: Gastroenterology;  Laterality: N/A;   PERIPHERAL VASCULAR INTERVENTION Right 08/30/2020   Procedure: PERIPHERAL VASCULAR INTERVENTION;  Surgeon: Cherre Robins, MD;  Location: [place] [facility];  Service: Cardiovascular;  Laterality: Right;  femoral popliteal   PERIPHERAL VASCULAR INTERVENTION Left 09/04/2020   Procedure: PERIPHERAL VASCULAR INTERVENTION;  Surgeon: Cherre Robins, MD;  Location: [place] [facility];  Service: Cardiovascular;  Laterality: Left;  SFA   TUBAL LIGATION      Family History  Problem Relation Age of Onset   Diabetes Mother    Diabetes Sister    Diabetes Brother             Social History:  reports that she has been smoking cigarettes. She has a 40.00 pack-year smoking history. She has never used smokeless tobacco. She reports that she does not drink alcohol and does not use drugs.  No Known Allergies  HOME MEDICATIONS:                                                                                                                      No current facility-administered medications on file prior to encounter.   Current Outpatient Medications on File Prior to Encounter  Medication Sig Dispense Refill   ACCU-CHEK GUIDE test strip USE TO test AS DIRECTED TWICE DAILY 200 strip 2   acetaminophen (TYLENOL) 500 MG tablet Take 1,000 mg by mouth every 6 (six) hours as needed (pain).     albuterol (PROVENTIL) (2.5 MG/3ML) 0.083% nebulizer solution Inhale 3 mLs (2.5 mg total) into the lungs  every 4 (four) hours as needed for wheezing or shortness of breath. 75 mL 12   albuterol (VENTOLIN HFA) 108 (90 Base) MCG/ACT inhaler Inhale 1 puff into the lungs every 6 (six) hours as needed for wheezing or shortness of breath.     aspirin 81 MG EC tablet Take 81 mg by mouth daily.     atorvastatin (LIPITOR) 80 MG tablet Take 1 tablet (80 mg total) by mouth daily at 6 PM. (Patient taking differently: Take 80 mg by mouth daily.) 30 tablet 0   clopidogrel (PLAVIX) 75 MG tablet Take 1 tablet (75 mg total) by mouth daily. 30 tablet 11   feeding supplement (ENSURE ENLIVE / ENSURE PLUS) LIQD Take 237 mLs by mouth 2 (two) times daily between meals. 237 mL 12   gabapentin (NEURONTIN) 300 MG capsule Take 1 capsule (300 mg total) by mouth 3 (three) times daily. 30 capsule 3   insulin glargine (LANTUS) 100 UNIT/ML injection Inject 0.15 mLs (15 Units total) into the skin daily. 10 mL 11   linagliptin (TRADJENTA) 5 MG TABS tablet Take 5 mg by mouth daily.     pantoprazole (PROTONIX) 40 MG tablet Take 1 tablet (40 mg total) by mouth 2 (two) times daily. 84 tablet 0     ROS:  Unable to obtain due to AMS.    There were no vitals taken for this visit.   General Examination:                                                                                                       Physical Exam  HEENT-  [place]/AT. No neck stiffness.   Lungs- Respirations unlabored Extremities- Onychomycosis noted.   Neurological Examination Mental Status: Awake with increased latencies of verbal and motor responses. Speech is mildly dysarthric in the context of being edentulous. Short responses to all questions, but fluent in this context as well as slowed speech. Naming for common words intact. Does not attend well and in this context does not follow most commands. Not oriented to situation or  location. States she lives with her grandson. Does not commend further when asked how old he is or if he takes good care of her. Appears confused.  Cranial Nerves: II: Will make eye contact. Pupils equal. Will identify objects visually. Left sided visual field cut (chronic per daughter) III,IV, VI: No ptosis. Eyes conjugate without forced gaze deviation.  V: Reacts to stimulation bilaterally.  VII: Left facial droop.  VIII: Hearing intact to some questions IX,X: Unable to visualize palate XI: Turns head slowly to the left and right to stimuli XII: Tongue deviated slightly to the left Motor: RUE: Keeps arm elevated > 10 seconds after being passively raised, but only if constantly coached.  Does not follow commands for formal strength testing.  RLE: Does not elevate to command and drifts to bed after passive elevation, but will withdraw to noxious plantar stimulation LUE with severely increased flexor tone at elbow, wrist and digits. Chronic flexion contractures noted. Drops to bed immediately after passive elevation.  LLE with increased flexor tone at the knee and hip; flexion contractures noted. Moves slightly to noxious stimuli Sensory: Reacts to stimulation x 4. Difficult to assess for asymmetry of responses given AMS.  Deep Tendon Reflexes: 1-2+ reflexes on the right. Difficult to elicit left sided reflexes due to increased tone.  Plantars: Equivocal bilaterally Cerebellar: Unable to follow detailed commands for testing.  Gait: Unable to assess   Lab Results: Basic Metabolic Panel: No results for input(s): NA, K, CL, CO2, GLUCOSE, BUN, CREATININE, CALCIUM, MG, PHOS in the last 168 hours.  CBC: No results for input(s): WBC, NEUTROABS, HGB, HCT, MCV, PLT in the last 168 hours.  Cardiac Enzymes: No results for input(s): CKTOTAL, CKMB, CKMBINDEX, TROPONINI in the last 168 hours.  Lipid Panel: No results for input(s): CHOL, TRIG, HDL, CHOLHDL, VLDL, LDLCALC in the last 168  hours.  Imaging: No results found.   Assessment: 75 year old female with chronic left sided weakness secondary to prior stroke, presenting as a Code Stroke with acute onset of mutism, left facial droop and left sided drooling. The patient is wheelchair-bound at baseline.  1. Exam reveals findings referable to a chronic right cerebral hemisphere lesion in conjunction with diffuse bihemispheric dysfunction suggestive of a toxic/metabolic/infectious etiology for her acute decompensation.  2. Daughter states patient has had  7 strokes in the past. Her second stroke was the one leaving the patient with left hemiplegia.  3. CT head: No acute intracranial abnormality is identified.  ASPECTS is 10. Redemonstrated parenchymal atrophy, chronic ischemic changes and chronic infarcts. 4. CTA neck: The common carotid and internal carotid arteries are patent within the neck. Atherosclerotic stenosis at the origin of the right ICA estimated at 80%, progressed from the prior examination of 12/17/2020. Atherosclerotic stenosis at the origin of the left ICA estimated at 90%, also progressed. The dominant right vertebral artery is patent within the neck with unchanged mild atherosclerotic narrowing at the origin of this vessel. 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 Distal to this, there is irregular and faint opacification of the left vertebral artery, likely due to retrograde flow.  5. CTA head: No intracranial large vessel occlusion is identified. Intracranial atherosclerotic disease with multifocal stenoses, most notably as follows. Redemonstrated sites of up to moderate stenosis within the cavernous right ICA. Moderate/severe stenosis within the distal petrous/proximal cavernous left ICA, progressed. Redemonstrated severe stenosis within a mid to distal right M2 MCA vessel. Redemonstrated moderate stenosis within a superior division proximal M2 left MCA vessel. Sites of  progressive severe stenosis within a distal M2 left MCA vessel. Redemonstrated sites of severe stenosis within the V4 left vertebral artery. Redemonstrated moderate stenosis within the P2 right PCA. Progressive severe stenosis within the P3 right PCA. Redemonstrated moderate stenosis within the P2 left PCA. 6. Of note, CTA neck incidentally images irregular opacities within the bilateral upper lobes measuring up to 4.5 cm, new from prior examinations. While these findings may be infectious/inflammatory in etiology, they are suspicious for possible pulmonary neoplasm. A dedicated contrast-enhanced chest CT is recommended for initial further evaluation. 7. DDx for presentation: High on the differential is a partial complex seizure with postictal state. Daughter states she has had similar spells in the past that were stereotyped, diagnosed as seizures and treated with an anticonvulsant. The anticonvulsant was stopped about 3 years ago. Daughter is unable to recall the name of the medication. Also on the DDx is infection with associated encephalopathy. Unlikely to be stroke, but will need to rule out with MRI brain.     Recommendations: 1. STAT EEG (ordered) 2. Evaluation for possible infection 3. MRI brain.  4. Continue her home Plavix, ASA and atorvastatin  5. Further recommendations pending results of the above.   Addendum: - EEG preliminary read reveals right sided spikes, which have a PLED-like morphology, but are not definitely PLEDs.  - EEG official report conclusions: - Spike, right hemisphere, maximal right frontal region - Intermittent slow, generalized and maximal right frontal region. Impression - Evidence of epileptogenicity and cortical dysfunction arising from right hemisphere, maximal right frontal region likely secondary to underlying structural abnormality and high risk for seizure recurrence . Additionally EEG suggestive of mild diffuse encephalopathy, nonspecific etiology.  No seizures  were seen throughout the recording. - Based on the EEG findings, the most likely etiology for her presentation today was recurrence of the partial complex seizure activity that daughter states she has had in the past. On interview, the daughter also feels that the event occurring at home today was most likely a seizure.  - Will load with 750 mg valproic acid by mouth, then start low-dose oral valproic acid at 250 mg PO TID.     Electronically signed: Dr. Kerney Elbe 06/27/2021, 9:50 AM

## 2021-06-27 NOTE — ED Triage Notes (Signed)
Pt from home as code stroke. Per EMS patient LSN 0600 this morning. They report deficits of slurred speech, L sided weakness, L facial droop. On daughter's arrival she states that L sided weakness is normal. Fever at home x 1 day. Hx of seizures.

## 2021-06-27 NOTE — Procedures (Signed)
Patient Name: Crystal Moore  MRN: [id number]  Epilepsy Attending: Lora Havens  Referring Physician/Provider: Dr Kerney Elbe Date: 06/27/2021 Duration: 36.34 mins  Patient history: 75 year old female with chronic left sided weakness secondary to prior stroke, presenting as a Code Stroke with acute onset of mutism, left facial droop and left sided drooling.  EEG to evaluate for seizure.  Level of alertness: Awake  AEDs during EEG study:   Technical aspects: This EEG study was done with scalp electrodes positioned according to the 10-20 International system of electrode placement. Electrical activity was acquired at a sampling rate of 500Hz  and reviewed with a high frequency filter of 70Hz  and a low frequency filter of 1Hz . EEG data were recorded continuously and digitally stored.   Description: The posterior dominant rhythm consists of 8 Hz activity of moderate voltage (25-35 uV) seen predominantly in posterior head regions, symmetric and reactive to eye opening and eye closing. Drowsiness was characterized by attenuation of the posterior background rhythm.  EEG showed intermittent generalized and maximal right frontal region 3 to 6 Hz theta-delta slowing.  Spikes were also noted in the right hemisphere, maximal right frontal region which at times appeared quasiperiodic at 1 Hz.  Hyperventilation and photic stimulation were not performed.     ABNORMALITY - Spike, right hemisphere, maximal right frontal region  - Intermittent slow, generalized and maximal right frontal region  IMPRESSION: This study showed evidence of epileptogenicity and cortical dysfunction arising from right hemisphere, maximal right frontal region likely secondary to underlying structural abnormality and high risk for seizure recurrence . Additionally EEG suggestive of mild diffuse encephalopathy, nonspecific etiology.  No seizures were seen throughout the recording.  Dr Cheral Marker was notified  Wateen Varon Barbra Sarks

## 2021-06-27 NOTE — Code Documentation (Signed)
Stroke Response Nurse Documentation Code Documentation  Crystal Moore is a 75 y.o. female arriving to [facility] ED via [facility] on 06/27/21 with past medical hx of multiple CVAs, DM, COPD, seizures. On aspirin 81 mg daily and clopidogrel 75 mg daily. Code stroke was activated by EMS.   Patient from home where she was LKW at 0600 and now complaining of increased weakness, slurred speech, and facial droop. Daughter explains that patient is baseline weak and wheelchair bound and the left sided weakness is unchanged. She also explains that this is similar to how patient presented a few years ago when she had a seizure.   Stroke team at the bedside on patient arrival. Labs drawn and patient cleared for CT by Dr. Maryan Rued. Patient to CT with team. NIHSS 12, see documentation for details and code stroke times. Patient with left facial droop, left arm weakness, left leg weakness, and Visual  neglect on exam. The following imaging was completed: CT, CTA head and neck. Patient is not a candidate for IV Thrombolytic. Patient is not a candidate for IR due to no new suspected LVO.  Care/Plan: Q2 NIHSS and continued work up for other possible sources of symptoms.  Bedside handoff with ED RN Judson Roch.    Meda Klinefelter  Stroke Response RN

## 2021-06-27 NOTE — ED Provider Notes (Signed)
[facility] Provider Note   CSN: [id number] Arrival date & time: 06/27/21  0347  An emergency department physician performed an initial assessment on this suspected stroke patient at 0945.  History Chief Complaint  Patient presents with   Code Stroke    Crystal Moore is a 75 y.o. female.  Patient is a 75 year old female with a history of CVA x7 with chronic left upper extremity weakness, seizures, COPD and diabetes who is presenting today as a code stroke.  EMS reports that family stated that she woke up this morning and she was her normal self.  She is able to speak and ambulate and then at 6 AM she suddenly developed a left-sided facial droop, drooling, slurred speech and inability to ambulate.  This has been persistent since 6 AM.  Family also reported that for the last 2 days patient has had a fever but did not give any history about nausea, vomiting, change in appetite, new cough or other symptoms.  They are not currently here to get further information.  EMS reported normal blood sugar and mild hypertension throughout transport.  Patient continues to have slurred speech and left-sided facial droop.  She was not able to ambulate with EMS.  They got an LVO of 2 and initiated code stroke protocol.  Patient's blood sugar was within normal limits.  On exam here patient denies headache, chest pain, shortness of breath or abdominal pain.  However she is not able to give further information.  The history is provided by the patient, the EMS personnel, medical records and a caregiver. The history is limited by the absence of a caregiver and the condition of the patient.      Past Medical History:  Diagnosis Date   Arthritis    COPD (chronic obstructive pulmonary disease) ([place])    COVID-19 virus infection 07/2020   CVA (cerebral vascular accident) ([place])    Diabetes mellitus without complication ([place])    Hemiparesis affecting left side as late effect of  cerebrovascular accident (CVA) ([place])    Pyelonephritis    Seizures ([place])    Stroke ([place])    Tobacco use disorder    UTI (urinary tract infection)     Patient Active Problem List   Diagnosis Date Noted   Diabetic oculopathy ([place]) 11/14/2020   Hemiplegia of nondominant side as late effect of cerebrovascular disease ([place]) 11/14/2020   Hyperglycemia due to type 2 diabetes mellitus ([place]) 11/14/2020   Long term (current) use of insulin ([place]) 11/14/2020   Memory loss 11/14/2020   Nicotine dependence, unspecified, uncomplicated 42/59/5638   Non-pressure chronic ulcer of other part of left foot with unspecified severity ([place]) 11/14/2020   Residual cognitive deficit as late effect of cerebrovascular accident 11/14/2020   Pressure injury of skin 09/16/2020   Acute blood loss anemia    Gastric AVM    Acute upper GI bleed 09/14/2020   Symptomatic anemia 09/14/2020   Peripheral arterial disease with history of revascularization ([place]) 09/14/2020   Cellulitis of great toe of right foot 08/26/2020   CVA (cerebral vascular accident) ([place])    COVID-19 virus infection 07/2020   Ankle ulcer ([place]) 06/28/2020   Closed fracture of neck of left femur ([place]) 08/11/2019   COVID-19 virus detected 08/11/2019   Dementia ([place]) 08/11/2019   AKI (acute kidney injury) ([place]) 01/15/2019   Cerebral thrombosis with cerebral infarction 01/15/2019   TIA (transient ischemic attack) 01/13/2019   Type 2 diabetes mellitus with vascular disease ([place])  01/13/2019   Hypokalemia 12/18/2018   Acute CVA (cerebrovascular accident) ([place]) 12/17/2018   Malnutrition of moderate degree 05/31/2018   Hematochezia 05/29/2018   Ischemic colitis ([place]) 05/29/2018   Acute encephalopathy 05/29/2018   Type 2 diabetes mellitus ([place]) 05/29/2018   Hypertension 05/29/2018   Depression 05/29/2018   COPD (chronic obstructive pulmonary disease) ([place]) 05/29/2018   Sepsis ([place]) 05/28/2018   Diabetes ([place]) 03/16/2018   ILD (interstitial lung  disease) ([place]) 02/18/2018   Chronic respiratory failure with hypoxia ([place]) 02/18/2018   COPD (chronic obstructive pulmonary disease) ([place]) 01/12/2018   Tobacco abuse 01/12/2018   Arthritis 07/29/2017   Asthma 07/29/2017   Gastroesophageal reflux disease 07/29/2017   Hyperlipidemia 07/29/2017   Left hemiplegia ([place]) 07/29/2017   Sciatica 07/29/2017   Vitamin D deficiency 07/29/2017    Past Surgical History:  Procedure Laterality Date   ABDOMINAL AORTOGRAM W/LOWER EXTREMITY N/A 08/30/2020   Procedure: ABDOMINAL AORTOGRAM W/LOWER EXTREMITY;  Surgeon: Cherre Robins, MD;  Location: [facility];  Service: Cardiovascular;  Laterality: N/A;   ABDOMINAL AORTOGRAM W/LOWER EXTREMITY N/A 09/04/2020   Procedure: ABDOMINAL AORTOGRAM W/LOWER EXTREMITY;  Surgeon: Cherre Robins, MD;  Location: [facility];  Service: Cardiovascular;  Laterality: N/A;   ESOPHAGOGASTRODUODENOSCOPY (EGD) WITH PROPOFOL N/A 09/16/2020   Procedure: ESOPHAGOGASTRODUODENOSCOPY (EGD) WITH PROPOFOL;  Surgeon: Lavena Bullion, DO;  Location: [facility];  Service: Gastroenterology;  Laterality: N/A;   HEMOSTASIS CLIP PLACEMENT  09/16/2020   Procedure: HEMOSTASIS CLIP PLACEMENT;  Surgeon: Lavena Bullion, DO;  Location: [facility];  Service: Gastroenterology;;   HOT HEMOSTASIS N/A 09/16/2020   Procedure: HOT HEMOSTASIS (ARGON PLASMA COAGULATION/BICAP);  Surgeon: Lavena Bullion, DO;  [facility];  Service: Gastroenterology;  Laterality: N/A;   PERIPHERAL VASCULAR INTERVENTION Right 08/30/2020   Procedure: PERIPHERAL VASCULAR INTERVENTION;  Surgeon: Cherre Robins, MD;  Location: [facility];  Service: Cardiovascular;  Laterality: Right;  femoral popliteal   PERIPHERAL VASCULAR INTERVENTION Left 09/04/2020   Procedure: PERIPHERAL VASCULAR INTERVENTION;  Surgeon: Cherre Robins, MD;  Location: [facility];  Service: Cardiovascular;  Laterality: Left;  SFA   TUBAL LIGATION       OB  History   No obstetric history on file.     Family History  Problem Relation Age of Onset   Diabetes Mother    Diabetes Sister    Diabetes Brother     Social History   Tobacco Use   Smoking status: Every Day    Packs/day: 1.00    Years: 40.00    Pack years: 40.00    Types: Cigarettes   Smokeless tobacco: Never  Vaping Use   Vaping Use: Never used  Substance Use Topics   Alcohol use: Never   Drug use: Never    Home Medications Prior to Admission medications   Medication Sig Start Date End Date Taking? Authorizing Provider  ACCU-CHEK GUIDE test strip USE TO test AS DIRECTED TWICE DAILY 05/23/21   Renato Shin, MD  acetaminophen (TYLENOL) 500 MG tablet Take 1,000 mg by mouth every 6 (six) hours as needed (pain).    [provider]  albuterol (PROVENTIL) (2.5 MG/3ML) 0.083% nebulizer solution Inhale 3 mLs (2.5 mg total) into the lungs every 4 (four) hours as needed for wheezing or shortness of breath. 01/17/19   Eulogio Bear U, DO  albuterol (VENTOLIN HFA) 108 (90 Base) MCG/ACT inhaler Inhale 1 puff into the lungs every 6 (six) hours as needed for wheezing or shortness  of breath.    [provider]  aspirin 81 MG EC tablet Take 81 mg by mouth daily. 11/07/15   [provider]  atorvastatin (LIPITOR) 80 MG tablet Take 1 tablet (80 mg total) by mouth daily at 6 PM. Patient taking differently: Take 80 mg by mouth daily. 12/21/18   Cristal Ford, DO  clopidogrel (PLAVIX) 75 MG tablet Take 1 tablet (75 mg total) by mouth daily. 09/24/20   Cherre Robins, MD  feeding supplement (ENSURE ENLIVE / ENSURE PLUS) LIQD Take 237 mLs by mouth 2 (two) times daily between meals. 06/28/20   Renato Shin, MD  gabapentin (NEURONTIN) 300 MG capsule Take 1 capsule (300 mg total) by mouth 3 (three) times daily. 09/06/20   Hosie Poisson, MD  insulin glargine (LANTUS) 100 UNIT/ML injection Inject 0.15 mLs (15 Units total) into the skin daily. 09/18/20   Terrilee Croak, MD   linagliptin (TRADJENTA) 5 MG TABS tablet Take 5 mg by mouth daily.    [provider]  pantoprazole (PROTONIX) 40 MG tablet Take 1 tablet (40 mg total) by mouth 2 (two) times daily. 09/18/20 10/30/20  Terrilee Croak, MD    Allergies    Patient has no known allergies.  Review of Systems   Review of Systems  Unable to perform ROS: Dementia (Also new deficits with slurred speech)   Physical Exam Updated Vital Signs BP 124/74   Pulse 93   Temp 99.1 F (37.3 C) (Oral)   Resp 13   Wt 65.4 kg   SpO2 96%   BMI 26.37 kg/m   Physical Exam Vitals and nursing note reviewed.  Constitutional:      General: She is not in acute distress.    Appearance: She is well-developed.  HENT:     Head: Normocephalic and atraumatic.     Mouth/Throat:     Mouth: Mucous membranes are moist.  Eyes:     Conjunctiva/sclera: Conjunctivae normal.     Pupils: Pupils are equal, round, and reactive to light.  Cardiovascular:     Rate and Rhythm: Normal rate and regular rhythm.     Heart sounds: No murmur heard. Pulmonary:     Effort: Pulmonary effort is normal. No respiratory distress.     Breath sounds: Normal breath sounds. No wheezing or rales.  Abdominal:     General: There is no distension.     Palpations: Abdomen is soft.     Tenderness: There is no abdominal tenderness. There is no guarding or rebound.  Musculoskeletal:        General: No tenderness. Normal range of motion.     Cervical back: Normal range of motion and neck supple.  Skin:    General: Skin is warm and dry.     Findings: No erythema or rash.  Neurological:     Mental Status: She is alert.     Motor: Weakness present.     Comments: Left sided facial droop with drooling from the left side of mouth, slurred speech.  Is able to follow commands but left sided upper ext contracture and weakness.  RUE with 5/5 strength and normal sensation  Psychiatric:        Behavior: Behavior normal.    ED Results / Procedures /  Treatments   Labs (all labs ordered are listed, but only abnormal results are displayed) Labs Reviewed  CBC WITH DIFFERENTIAL/PLATELET - Abnormal; Notable for the following components:      Result Value   MCH 25.9 (*)  RDW 17.1 (*)    All other components within normal limits  CBG MONITORING, ED - Abnormal; Notable for the following components:   Glucose-Capillary 153 (*)    All other components within normal limits  I-STAT CHEM 8, ED - Abnormal; Notable for the following components:   Creatinine, Ser 1.10 (*)    Glucose, Bld 147 (*)    Calcium, Ion 1.04 (*)    All other components within normal limits  RESP PANEL BY RT-PCR (FLU A&B, COVID) ARPGX2  URINE CULTURE  CULTURE, BLOOD (ROUTINE X 2)  CULTURE, BLOOD (ROUTINE X 2)  ETHANOL  COMPREHENSIVE METABOLIC PANEL  RAPID URINE DRUG SCREEN, HOSP PERFORMED  URINALYSIS, ROUTINE W REFLEX MICROSCOPIC  LACTIC ACID, PLASMA  APTT  PROTIME-INR  I-STAT CHEM 8, ED    EKG None  Radiology CT HEAD CODE STROKE WO CONTRAST  Result Date: 06/27/2021 CLINICAL DATA:  Code stroke. Neuro deficit, acute, stroke suspected. Additional history provided: Left-sided deficit, slurred speech, nonambulatory. EXAM: CT HEAD WITHOUT CONTRAST TECHNIQUE: Contiguous axial images were obtained from the base of the skull through the vertex without intravenous contrast. COMPARISON:  Head CT 01/30/2021 and earlier. Brain MRI 01/03/2019. MRA head 01/14/2019. FINDINGS: Brain: Mild generalized cerebral and cerebellar atrophy. Redemonstrated chronic right ACA territory cortical/subcortical infarct within the medial right frontal lobe. Redemonstrated chronic infarcts within the bilateral cerebral hemispheric white matter, deep gray nuclei, posterior limb of right internal capsule and right midbrain. Background advanced patchy and confluent hypoattenuation within the cerebral white matter, nonspecific but compatible with chronic small vessel ischemic disease. Redemonstrated  small chronic infarcts within the bilateral cerebellar hemispheres. There is no acute intracranial hemorrhage. No acute demarcated cortical infarct is identified. No extra-axial fluid collection. No evidence of an intracranial mass. No midline shift. Vascular: No hyperdense vessel.  Atherosclerotic calcifications. Skull: Normal. Negative for fracture or focal lesion. Sinuses/Orbits: Visualized orbits show no acute finding. mild mucosal thickening within the bilateral ethmoid and left maxillary sinuses at the imaged levels. ASPECTS [facility] Stroke Program Early CT Score) - Ganglionic level infarction (caudate, lentiform nuclei, internal capsule, insula, M1-M3 cortex): 7 - Supraganglionic infarction (M4-M6 cortex): 3 Total score (0-10 with 10 being normal): 10 These results were communicated to Dr. Cheral Marker at 10:14amon 12/2/2022by text page via the [facility] messaging system. IMPRESSION: Motion degraded exam. No acute intracranial abnormality is identified.  ASPECTS is 10. Redemonstrated parenchymal atrophy, chronic ischemic changes and chronic infarcts, as detailed. Electronically Signed   By: Kellie Simmering D.O.   On: 06/27/2021 10:17   CT ANGIO HEAD NECK W WO CM (CODE STROKE)  Result Date: 06/27/2021 CLINICAL DATA:  Neuro deficit, acute, stroke suspected. Aside deficit, slurred speech, nonambulatory. EXAM: CT ANGIOGRAPHY HEAD AND NECK TECHNIQUE: Multidetector CT imaging of the head and neck was performed using the standard protocol during bolus administration of intravenous contrast. Multiplanar CT image reconstructions and MIPs were obtained to evaluate the vascular anatomy. Carotid stenosis measurements (when applicable) are obtained utilizing NASCET criteria, using the distal internal carotid diameter as the denominator. CONTRAST:  40mL OMNIPAQUE IOHEXOL 350 MG/ML SOLN COMPARISON:  Noncontrast head CT performed earlier today 06/27/2021. MRA head 01/14/2019. CT angiogram head/neck 12/18/2018. Chest CT 02/15/2018  FINDINGS: CTA NECK FINDINGS Aortic arch: Common origin of the innominate and left common carotid arteries. Atherosclerotic plaque within the visualized aortic arch and proximal major branch vessels of the neck. Streak and beam hardening artifact arising from a dense right-sided contrast bolus partially obscures the right subclavian artery. Within this is a shin,  no hemodynamically significant stenosis is identified within the innominate or proximal right subclavian artery. Atherosclerotic plaque within the left subclavian artery results in up to 50% stenosis, unchanged. Right carotid system: CCA and ICA patent within the neck. Prominent soft and calcified plaque within the carotid bifurcation and proximal ICA. Resultant severe stenosis at the origin of the right ICA estimated at 80%, slightly progressed as compared to the prior examination of 12/18/2018. Distal to this, the ICA is patent within the neck without stenosis. Left carotid system: CCA and ICA patent within the neck. Scattered soft and calcified plaque within the left carotid system. Most notably, there is prominent soft calcified plaque within the carotid bifurcation and proximal ICA. Estimated stenosis at the origin of the left ICA of 90%, progressed. Distal to this, the ICA is patent within the neck without stenosis. Vertebral arteries: The dominant right vertebral artery is patent within the neck. Unchanged mild atherosclerotic narrowing at the origin of this vessel. As before, the non dominant left vertebral artery is occluded within the neck to the C5 level. Distal to this, there is irregular and faint opacification of the left vertebral artery, likely due to retrograde flow. Skeleton: Cervical spondylosis. No acute bony abnormality or aggressive osseous lesion. Other neck: Subcentimeter nodules within the thyroid gland, not meeting consensus criteria for ultrasound follow-up based on size. No neck mass or cervical lymphadenopathy. Upper chest:  Redemonstrated findings of fibrotic lung disease. New from prior examinations, there is an irregular opacity within the right upper lobe measuring 2.9 cm. Also new from prior exams, and irregular opacity is present within the left upper lobe measuring up to 4.5 cm in greatest dimension. These findings may be infectious/inflammatory, but are suspicious for possible neoplasm. Review of the MIP images confirms the above findings CTA HEAD FINDINGS Anterior circulation: The intracranial internal carotid arteries are patent. Calcified plaque within both vessels. Up to moderate stenosis within the cavernous right ICA. Moderate/severe stenosis within the distal petrous/proximal cavernous left ICA, progressed. The M1 middle cerebral arteries are patent. Atherosclerotic irregularity of the M2 and more distal middle cerebral artery vessels, bilaterally, most notably as follows. Redemonstrated severe stenosis within a mid to distal M2 right MCA vessel (series 10, image 17). Redemonstrated moderate stenosis within a superior division proximal M2 left MCA vessel. Sites of progressive severe stenosis within a distal M2 left MCA vessel. The anterior cerebral arteries are patent. Redemonstrated mild to moderate stenosis within the right A1 segment. No intracranial aneurysm is identified. Posterior circulation: The dominant intracranial right vertebral artery is patent. Mild nonstenotic calcified plaque within this vessel. The intracranial left vertebral artery is developmentally diminutive with superimposed foci of high-grade stenosis. The basilar artery is patent. The posterior cerebral arteries are patent. Atherosclerotic irregularity of both vessels. Redemonstrated moderate stenosis within the P2 right PCA. Progressive severe stenosis within the right PCA P3 segment. Redemonstrated moderate stenosis within the P2 left PCA. Posterior communicating arteries are diminutive or absent bilaterally. Venous sinuses: Within the  limitations of contrast timing, no convincing thrombus. Anatomic variants: As described. Review of the MIP images confirms the above findings No emergent large vessel occlusion. These results were communicated to Dr. Cheral Marker At 10:59 amon 12/2/2022by text page via the [facility] messaging system. IMPRESSION: CTA neck: 1. The common carotid and internal carotid arteries are patent within the neck. Atherosclerotic stenosis at the origin of the right ICA estimated at 80%, progressed from the prior examination of 12/17/2020. Atherosclerotic stenosis at the origin of the left ICA estimated  at 90%, also progressed. 2. The dominant right vertebral artery is patent within the neck with unchanged mild atherosclerotic narrowing at the origin of this vessel. 3. 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 Distal to this, there is irregular and faint opacification of the left vertebral artery, likely due to retrograde flow. 4. Irregular opacities within the bilateral upper lobes measuring up to 4.5 cm, new from prior examinations. While these findings may be infectious/inflammatory in etiology, they are suspicious for possible pulmonary neoplasm. A dedicated contrast-enhanced chest CT is recommended for initial further evaluation. CTA head: 1. No intracranial large vessel occlusion is identified. 2. Intracranial atherosclerotic disease with multifocal stenoses, most notably as follows. 3. Redemonstrated sites of up to moderate stenosis within the cavernous right ICA. 4. Moderate/severe stenosis within the distal petrous/proximal cavernous left ICA, progressed. 5. Redemonstrated severe stenosis within a mid to distal right M2 MCA vessel. 6. Redemonstrated moderate stenosis within a superior division proximal M2 left MCA vessel. 7. Sites of progressive severe stenosis within a distal M2 left MCA vessel. 8. Redemonstrated sites of severe stenosis within the V4 left vertebral artery. 9. Redemonstrated  moderate stenosis within the P2 right PCA. 10. Progressive severe stenosis within the P3 right PCA. 11. Redemonstrated moderate stenosis within the P2 left PCA. Electronically Signed   By: Kellie Simmering D.O.   On: 06/27/2021 10:59    Procedures Procedures   Medications Ordered in ED Medications  iohexol (OMNIPAQUE) 350 MG/ML injection 60 mL (60 mLs Intravenous Contrast Given 06/27/21 1011)    ED Course  I have reviewed the triage vital signs and the nursing notes.  Pertinent labs & imaging results that were available during my care of the patient were reviewed by me and considered in my medical decision making (see chart for details).    MDM Rules/Calculators/A&P                           Elderly female with multiple medical problems presenting today as a code stroke.  Patient had new onset of left-sided facial droop, slurred speech and inability to ambulate since 6 AM.  She does take Plavix and aspirin daily.  On exam she does have left-sided facial droop, slurred speech and this is not her baseline per her family.  She does have chronic left-sided upper extremity deficits but family reports she is normally able to ambulate without difficulty.  Patient was in the stretcher and unable to be ambulated at this time.  Raquel Sarna also reported recent infectious symptoms and fever.  Concern for sepsis, stroke, lower suspicion for acute cardiac cause.  No noted signs of seizure activity at this time.  Patient does not take seizure prophylaxis.  11:10 AM Patient's daughter is now present and reports that patient is wheelchair-bound and she does not ambulate at baseline however the facial droop, drooling and generalized weakness are new.  She did note a fever yesterday but did not have any other associated symptoms such as cough, shortness of breath, vomiting, diarrhea or change in her urinary habits.  Patient reports that in the past she had silent seizures and has not certain that she has not had some of  those today.  Her CT is consistent with progressive stenosis in multiple vessels but no evidence of acute stroke or clot at this time.  Neurology is at bedside and does not feel that she is a tPA candidate at this time.  Patient's  rectal temperature is 102 here and suspect an infectious etiology at this time.  Also patient is hypoxic today which is not her baseline.  She is requiring 2 L to maintain oxygen saturation of 96%.  On head and neck imaging and there was question of possible lung pathology.  Code sepsis was initiated.  Labs are pending.  Patient given Tylenol for fever.  1:11 PM Patient's chest x-ray without acute consolidation but appears to have pulmonary interstitial edema.  Initially patient had a episode of hypotension before fluids.  Pt improved with IVF.  Pt was covered for resp abx.  She did undergo eeg.  Pt will be admitted for further care due to flu, hypoxia.  MDM   Amount and/or Complexity of Data Reviewed Clinical lab tests: ordered and reviewed Tests in the radiology section of CPT: ordered and reviewed Tests in the medicine section of CPT: ordered and reviewed Decide to obtain previous medical records or to obtain history from someone other than the patient: yes Review and summarize past medical records: yes Discuss the patient with other providers: yes Independent visualization of images, tracings, or specimens: yes      Final Clinical Impression(s) / ED Diagnoses Final diagnoses:  Influenza A  Acute respiratory failure with hypoxia [facility])    Rx / DC Orders ED Discharge Orders     None        Blanchie Dessert, MD 06/27/21 1315

## 2021-06-27 NOTE — Code Documentation (Incomplete)
Stroke Response Nurse Documentation Code Documentation  Crystal Moore is a 75 y.o. female arriving to [facility] ED via [facility] on 06/27/21 with past medical hx of multiple CVAs, seizures, DM. On aspirin 81 mg daily and clopidogrel 75 mg daily. Code stroke was activated by EMS.  Patient from home where she was LKW this morning. She developed symptoms around 6 am and daughter called EMS and now complaining of increased generalized weakness, left sided facial droop, and aphasia and slurred speech.  Stroke team at the bedside on patient arrival. Labs drawn and patient cleared for CT by Dr. Maryan Rued. Patient to CT with team. NIHSS ***, see documentation for details and code stroke times. Patient with {RCVEL:[id number]} on exam. The following imaging was completed:  *** (CT, CTA head and neck, CTP). Patient {ACTION; IS/IS [id number]} a candidate for IV Thrombolytic due to ***. Patient is not a candidate for IR due to ***.   Care/Plan: ***.   Bedside handoff with ED RN ***.    Meda Klinefelter  Stroke Response RN

## 2021-06-27 NOTE — ED Notes (Signed)
Patient transported to MRI 

## 2021-06-27 NOTE — Progress Notes (Signed)
Pt only Aox2, therefore admission documentation not complete this shift.

## 2021-06-27 NOTE — Progress Notes (Signed)
Crystal Moore, Crystal Moore ([id number]) Visit Report for 06/26/2021 Chief Complaint Document Details Patient Name: Date of Service: Crystal Moore, Crystal Moore 06/26/2021 1:15 PM Medical Record Number: [id number] Patient Account Number: [id number] Date of Birth/Sex: Treating RN: October 22, 1945 (75 y.o. F) Primary Care Provider: [name] Other Clinician: Referring Provider: Treating Provider/Extender: [name] Weeks in Treatment: 0 Information Obtained from: Patient Chief Complaint 08/08/2020; patient is here accompanied by her grandson for review of wounds on the left lateral malleolus as well as an area on the right upper mid tibia 02/25/2021; patient is accompanied by daughter for review of her left lateral malleolus wound 06/26/2021; patient is accompanied by daughter for review of her left lateral malleolus wound and left lateral foot wound Electronic Signature(s) Signed: 06/26/2021 3:07:44 PM By: Kalman Shan DO Entered By: Kalman Shan on 06/26/2021 14:56:29 -------------------------------------------------------------------------------- Debridement Details Patient Name: Date of Service: Crystal Moore. 06/26/2021 1:15 PM Medical Record Number: [id number] Patient Account Number: [id number] Date of Birth/Sex: Treating RN: 1946/06/19 (75 y.o. Crystal Moore Primary Care Provider: [name] Other Clinician: Referring Provider: Treating Provider/Extender: [name] Weeks in Treatment: 0 Debridement Performed for Assessment: Wound #5 Left,Lateral Foot Performed By: Physician Kalman Shan, DO Debridement Type: Debridement Level of Consciousness (Pre-procedure): Awake and Alert Pre-procedure Verification/Time Out Yes - 14:29 Taken: Start Time: 14:30 Pain Control: Other : Benzocaine T Area Debrided (L x W): otal 1 (cm) x 1.5 (cm) = 1.5 (cm) Tissue and other material debrided: Non-Viable, Slough,  Subcutaneous, Slough Level: Skin/Subcutaneous Tissue Debridement Description: Excisional Instrument: Curette Bleeding: Minimum Hemostasis Achieved: Pressure End Time: 14:33 Response to Treatment: Procedure was tolerated well Level of Consciousness (Post- Awake and Alert procedure): Post Debridement Measurements of Total Wound Length: (cm) 1 Stage: Category/Stage III Width: (cm) 1.5 Depth: (cm) 0.2 Volume: (cm) 0.236 Character of Wound/Ulcer Post Debridement: Stable Post Procedure Diagnosis Same as Pre-procedure Electronic Signature(s) Signed: 06/26/2021 3:07:44 PM By: Kalman Shan DO Signed: 06/26/2021 5:30:54 PM By: Lorrin Jackson Entered By: Lorrin Jackson on 06/26/2021 14:36:45 -------------------------------------------------------------------------------- Debridement Details Patient Name: Date of Service: Crystal Moore. 06/26/2021 1:15 PM Medical Record Number: [id number] Patient Account Number: [id number] Date of Birth/Sex: Treating RN: 01-13-1946 (76 y.o. Crystal Moore Primary Care Provider: [name] Other Clinician: Referring Provider: Treating Provider/Extender: [name] Weeks in Treatment: 0 Debridement Performed for Assessment: Wound #6 Left,Lateral Ankle Performed By: Physician Kalman Shan, DO Debridement Type: Debridement Level of Consciousness (Pre-procedure): Awake and Alert Pre-procedure Verification/Time Out Yes - 14:29 Taken: Start Time: 14:33 Pain Control: Other : Benzocaine T Area Debrided (L x W): otal 1.6 (cm) x 1 (cm) = 1.6 (cm) Tissue and other material debrided: Non-Viable, Slough, Slough Level: Non-Viable Tissue Debridement Description: Selective/Open Wound Instrument: Curette Bleeding: Minimum Hemostasis Achieved: Pressure End Time: 14:36 Response to Treatment: Procedure was tolerated well Level of Consciousness (Post- Awake and Alert procedure): Post Debridement  Measurements of Total Wound Length: (cm) 1.6 Stage: Category/Stage III Width: (cm) 1 Depth: (cm) 0.3 Volume: (cm) 0.377 Character of Wound/Ulcer Post Debridement: Stable Post Procedure Diagnosis Same as Pre-procedure Electronic Signature(s) Signed: 06/26/2021 3:07:44 PM By: Kalman Shan DO Signed: 06/26/2021 5:30:54 PM By: Lorrin Jackson Entered By: Lorrin Jackson on 06/26/2021 14:38:23 -------------------------------------------------------------------------------- HPI Details Patient Name: Date of Service: Crystal Grammes M. 06/26/2021 1:15 PM Medical Record Number: [id number] Patient Account Number: [id number] Date of Birth/Sex: Treating RN: 08-18-1945 (75 y.o. F) Primary Care Provider: [name] Other Clinician: Referring Provider: Treating Provider/Extender: Lake Bells,  Rupashree Weeks in Treatment: 0 History of Present Illness HPI Description: Admission 02/25/2021 Ms. Crystal Moore is a 75 year old female with a past medical history of uncontrolled insulin-dependent type 2 diabetes, CVA with left hemiparesis, vascular dementia and peripheral vascular disease that presents to the clinic for worsening left lateral malleolus wound. She was treated in our clinic earlier this year for the wound. It was well-healing at that time however she has not followed up since March. She has noticed a decline in the past 1.5 months. She has been using silver alginate to the area. She currently denies signs of infection. She does report tenderness to the wound bed. 8/22; patient presents for follow-up. Daughter is not present today. Patient has no issues or complaints today. She states that her daughter is doing the dressing changes. She has not obtained the x-ray ordered at last clinic visit. She is currently not in pain. Readmission 06/26/2021 Patient was last seen on 03/17/2021 for a left lateral malleolus wound. It is unclear why she has not followed up. She  continues to have a left lateral malleolus wound and now has developed a left lateral foot wound. She has been using silver alginate to these areas. She currently denies signs of infection. She still has not obtained the x-ray ordered at last admission. Electronic Signature(s) Signed: 06/26/2021 3:07:44 PM By: Kalman Shan DO Entered By: Kalman Shan on 06/26/2021 14:58:46 -------------------------------------------------------------------------------- Physical Exam Details Patient Name: Date of Service: Crystal Moore, Crystal Moore 06/26/2021 1:15 PM Medical Record Number: [id number] Patient Account Number: [id number] Date of Birth/Sex: Treating RN: 1946-05-30 (75 y.o. F) Primary Care Provider: [name] Other Clinician: Referring Provider: Treating Provider/Extender: [name] Weeks in Treatment: 0 Constitutional respirations regular, non-labored and within target range for patient.. Cardiovascular 2+ dorsalis pedis/posterior tibialis pulses. Psychiatric pleasant and cooperative. Notes Left foot: T the lateral malleolus there is an open wound with granulation tissue and some nonviable tissue present. T the lateral aspect there is an open o o wound with nonviable tissue throughout. No obvious signs of infection on exam. Electronic Signature(s) Signed: 06/26/2021 3:07:44 PM By: Kalman Shan DO Entered By: Kalman Shan on 06/26/2021 14:59:44 -------------------------------------------------------------------------------- Physician Orders Details Patient Name: Date of Service: Crystal Moore. 06/26/2021 1:15 PM Medical Record Number: [id number] Patient Account Number: [id number] Date of Birth/Sex: Treating RN: 1945-09-05 (75 y.o. Crystal Moore Primary Care Provider: [name] Other Clinician: Referring Provider: Treating Provider/Extender: [name] Weeks in Treatment: 0 Verbal / Phone  Orders: No Diagnosis Coding Follow-up Appointments ppointment in 1 week. - with Dr. Heber Crary Return A Bathing/ Shower/ Hygiene May shower and wash wound with soap and water. - when changing dressing Off-Loading Other: - Bunny Boots Additional Orders / Instructions Follow [facility] wound care orders this week; continue Home Health for wound care. May utilize formulary equivalent dressing for wound treatment orders unless otherwise specified. [facility] for wound care, 2-3x week Other Home Health Orders/Instructions: - [facility] Wound Treatment Wound #5 - Foot Wound Laterality: Left, Lateral Cleanser: Soap and Water (Home Health) 1 x Per Day/30 Days Discharge Instructions: May shower and wash wound with dial antibacterial soap and water prior to dressing change. Cleanser: Wound Cleanser (Home Health) 1 x Per Day/30 Days Discharge Instructions: Cleanse the wound with wound cleanser prior to applying a clean dressing using gauze sponges, not tissue or cotton balls. Prim Dressing: Santyl Ointment (Home Health) 1 x Per Day/30 Days ary Discharge Instructions: Apply nickel  thick amount to wound bed as instructed Secondary Dressing: Woven Gauze Sponges 2x2 in (Home Health) 1 x Per Day/30 Days Discharge Instructions: Apply over primary dressing as directed. Secondary Dressing: Zetuvit Plus Silicone Border Dressing 4x4 (in/in) (Home Health) 1 x Per Day/30 Days Discharge Instructions: Apply silicone border over primary dressing as directed. Wound #6 - Ankle Wound Laterality: Left, Lateral Cleanser: Soap and Water (Home Health) 1 x Per Day/30 Days Discharge Instructions: May shower and wash wound with dial antibacterial soap and water prior to dressing change. Cleanser: Wound Cleanser (Home Health) 1 x Per Day/30 Days Discharge Instructions: Cleanse the wound with wound cleanser prior to applying a clean dressing using gauze sponges, not tissue or cotton balls. Prim  Dressing: Santyl Ointment (Home Health) 1 x Per Day/30 Days ary Discharge Instructions: Apply nickel thick amount to wound bed as instructed Secondary Dressing: Woven Gauze Sponges 2x2 in (Home Health) 1 x Per Day/30 Days Discharge Instructions: Apply over primary dressing as directed. Secondary Dressing: Zetuvit Plus Silicone Border Dressing 4x4 (in/in) (Home Health) 1 x Per Day/30 Days Discharge Instructions: Apply silicone border over primary dressing as directed. Radiology X-ray, foot: Left - Non-healing wound X-ray, ankle: Left - Non-healing wound Patient Medications llergies: No Known Drug Allergies A Notifications Medication Indication Start End 06/26/2021 Santyl DOSE 1 - topical 250 unit/gram ointment - 1 application daily Electronic Signature(s) Signed: 06/26/2021 3:07:44 PM By: Kalman Shan DO Previous Signature: 06/26/2021 3:06:25 PM Version By: Kalman Shan DO Entered By: Kalman Shan on 06/26/2021 15:06:41 Prescription 06/26/2021 -------------------------------------------------------------------------------- Santa Lighter. Kalman Shan DO Patient Name: Provider: 12-05-1945 [id number] Date of Birth: NPI#: [id number] Sex: DEA #: [phone] Phone #: License #: [place] Patient Address: [place] [address] [place], [place] [address] [place], [place] [address] [phone] Allergies No Known Drug Allergies Provider's Orders X-ray, foot: Left - Non-healing wound Hand Signature: Date(s): Prescription 06/26/2021 Jeanie Sewer M. Kalman Shan DO Patient Name: Provider: [birth year] [id number] Date of Birth: NPI#: [id number] Sex: DEA #: [phone] Phone #: License #: [place] Patient Address: [place], [place] [address] [place], [place] [address] [phone] Allergies No  Known Drug Allergies Provider's Orders X-ray, ankle: Left - Non-healing wound Hand Signature: Date(s): Electronic Signature(s) Signed: 06/26/2021 3:07:44 PM By: Kalman Shan DO Entered By: Kalman Shan on 06/26/2021 15:06:41 -------------------------------------------------------------------------------- Problem List Details Patient Name: Date of Service: Crystal Moore. 06/26/2021 1:15 PM Medical Record Number: [id number] Patient Account Number: [id number] Date of Birth/Sex: Treating RN: 16-Aug-1945 (75 y.o. F) Primary Care Provider: [name] Other Clinician: Referring Provider: Treating Provider/Extender: [name] Weeks in Treatment: 0 Active Problems ICD-10 Encounter Code Description Active Date MDM Diagnosis L89.893 Pressure ulcer of other site, stage 3 06/26/2021 No Yes S91.302A Unspecified open wound, left foot, initial encounter 06/26/2021 No Yes I73.9 Peripheral vascular disease, unspecified 06/26/2021 No Yes E11.621 Type 2 diabetes mellitus with foot ulcer 06/26/2021 No Yes Inactive Problems Resolved Problems Electronic Signature(s) Signed: 06/26/2021 3:07:44 PM By: Kalman Shan DO Entered By: Kalman Shan on 06/26/2021 15:05:21 -------------------------------------------------------------------------------- Progress Note Details Patient Name: Date of Service: Crystal Moore. 06/26/2021 1:15 PM Medical Record Number: [id number] Patient Account Number: [id number] Date of Birth/Sex: Treating RN: 1946/05/02 (75 y.o. F) Primary Care Provider: [name] Other Clinician: Referring Provider: Treating Provider/Extender: [name] Weeks in Treatment: 0 Subjective Chief  Complaint Information obtained from Patient 08/08/2020; patient is here accompanied by her grandson for review of wounds on the left lateral malleolus as well as an area on the right upper mid  tibia 02/25/2021; patient is accompanied by daughter for review of her left lateral malleolus wound 06/26/2021; patient is accompanied by daughter for review of her left lateral malleolus wound and left lateral foot wound History of Present Illness (HPI) Admission 02/25/2021 Ms. Marva Hendryx is a 75 year old female with a past medical history of uncontrolled insulin-dependent type 2 diabetes, CVA with left hemiparesis, vascular dementia and peripheral vascular disease that presents to the clinic for worsening left lateral malleolus wound. She was treated in our clinic earlier this year for the wound. It was well-healing at that time however she has not followed up since March. She has noticed a decline in the past 1.5 months. She has been using silver alginate to the area. She currently denies signs of infection. She does report tenderness to the wound bed. 8/22; patient presents for follow-up. Daughter is not present today. Patient has no issues or complaints today. She states that her daughter is doing the dressing changes. She has not obtained the x-ray ordered at last clinic visit. She is currently not in pain. Readmission 06/26/2021 Patient was last seen on 03/17/2021 for a left lateral malleolus wound. It is unclear why she has not followed up. She continues to have a left lateral malleolus wound and now has developed a left lateral foot wound. She has been using silver alginate to these areas. She currently denies signs of infection. She still has not obtained the x-ray ordered at last admission. Patient History Unable to Obtain Patient History due to Aphasia. Information obtained from Patient. Allergies No Known Drug Allergies Family History Unknown History. Social History Former smoker - quit 2 years, Marital Status - Single, Alcohol Use - Never, Drug Use - No History, Caffeine Use - Rarely. Medical History Eyes Patient has history of Cataracts - bil removed Respiratory Patient has  history of Chronic Obstructive Pulmonary Disease (COPD) Cardiovascular Patient has history of Hypertension Endocrine Patient has history of Type II Diabetes Musculoskeletal Patient has history of Osteoarthritis Neurologic Patient has history of Seizure Disorder Psychiatric Denies history of Anorexia/bulimia, Confinement Anxiety Medical A Surgical History Notes nd Cardiovascular CVA Genitourinary Pyelonephritis Neurologic left hemiparesis, CVA Review of Systems (ROS) Integumentary (Skin) Complains or has symptoms of Wounds. Objective Constitutional respirations regular, non-labored and within target range for patient.. Vitals Time Taken: 1:58 PM, Temperature: 99 F, Pulse: 48 bpm, Respiratory Rate: 18 breaths/min, Blood Pressure: 171/70 mmHg. Cardiovascular 2+ dorsalis pedis/posterior tibialis pulses. Psychiatric pleasant and cooperative. General Notes: Left foot: T the lateral malleolus there is an open wound with granulation tissue and some nonviable tissue present. T the lateral aspect there o o is an open wound with nonviable tissue throughout. No obvious signs of infection on exam. Integumentary (Hair, Skin) Wound #1 status is Open. Original cause of wound was Pressure Injury. The date acquired was: 03/27/2020. The wound has been in treatment 46 weeks. The wound is located on the Left,Lateral Malleolus. The wound measures 1.6cm length x 1cm width x 0.3cm depth; 1.257cm^2 area and 0.377cm^3 volume. There is Fat Layer (Subcutaneous Tissue) exposed. There is no tunneling or undermining noted. There is a medium amount of serosanguineous drainage noted. The wound margin is distinct with the outline attached to the wound base. There is medium (34-66%) red granulation within the wound bed. There is a medium (34- 66%)  amount of necrotic tissue within the wound bed including Adherent Slough. Wound #5 status is Open. Original cause of wound was Pressure Injury. The date acquired was:  06/05/2021. The wound is located on the Left,Lateral Foot. The wound measures 1cm length x 1.5cm width x 0.2cm depth; 1.178cm^2 area and 0.236cm^3 volume. There is Fat Layer (Subcutaneous Tissue) exposed. There is no tunneling or undermining noted. There is a medium amount of purulent drainage noted. The wound margin is distinct with the outline attached to the wound base. There is small (1-33%) red granulation within the wound bed. There is a large (67-100%) amount of necrotic tissue within the wound bed including Adherent Slough. Wound #6 status is Open. Original cause of wound was Pressure Injury. The date acquired was: 03/27/2020. The wound is located on the Left,Lateral Ankle. The wound measures 1.6cm length x 1cm width x 0.3cm depth; 1.257cm^2 area and 0.377cm^3 volume. There is Fat Layer (Subcutaneous Tissue) exposed. There is no tunneling or undermining noted. There is a medium amount of serosanguineous drainage noted. The wound margin is distinct with the outline attached to the wound base. There is large (67-100%) red granulation within the wound bed. There is a small (1-33%) amount of necrotic tissue within the wound bed including Adherent Slough. Assessment Active Problems ICD-10 Pressure ulcer of other site, stage 3 Unspecified open wound, left foot, initial encounter Peripheral vascular disease, unspecified Type 2 diabetes mellitus with foot ulcer Patient was last seen 3 months ago for her left lateral malleolus wound. She continues to have this and has now developed a new wound to the lateral aspect of the left foot. We discussed the importance of aggressive offloading. She is using Prevalon boots but rarely keeps it in place. I debrided nonviable tissue. I recommended Santyl to the wound beds. She states she followed up with vein and vascular to assure that her previous stents are open and she states that there are no current blood flow issues. I cannot see this encounter in the  EMR. I did recommend however that she follow through and obtain the left foot x-ray. Procedures Wound #5 Pre-procedure diagnosis of Wound #5 is a Pressure Ulcer located on the Left,Lateral Foot . There was a Excisional Skin/Subcutaneous Tissue Debridement with a total area of 1.5 sq cm performed by Kalman Shan, DO. With the following instrument(s): Curette to remove Non-Viable tissue/material. Material removed includes Subcutaneous Tissue and Slough and after achieving pain control using Other (Benzocaine). No specimens were taken. A time out was conducted at 14:29, prior to the start of the procedure. A Minimum amount of bleeding was controlled with Pressure. The procedure was tolerated well. Post Debridement Measurements: 1cm length x 1.5cm width x 0.2cm depth; 0.236cm^3 volume. Post debridement Stage noted as Category/Stage III. Character of Wound/Ulcer Post Debridement is stable. Post procedure Diagnosis Wound #5: Same as Pre-Procedure Wound #6 Pre-procedure diagnosis of Wound #6 is a Pressure Ulcer located on the Left,Lateral Ankle . There was a Selective/Open Wound Non-Viable Tissue Debridement with a total area of 1.6 sq cm performed by Kalman Shan, DO. With the following instrument(s): Curette to remove Non-Viable tissue/material. Material removed includes [facility] after achieving pain control using Other (Benzocaine). No specimens were taken. A time out was conducted at 14:29, prior to the start of the procedure. A Minimum amount of bleeding was controlled with Pressure. The procedure was tolerated well. Post Debridement Measurements: 1.6cm length x 1cm width x 0.3cm depth; 0.377cm^3 volume. Post debridement Stage noted as Category/Stage III. Character  of Wound/Ulcer Post Debridement is stable. Post procedure Diagnosis Wound #6: Same as Pre-Procedure Plan Follow-up Appointments: Return Appointment in 1 week. - with Dr. Marigene Ehlers Shower/ Hygiene: May shower and wash  wound with soap and water. - when changing dressing Off-Loading: Other: - Bunny Boots Additional Orders / Instructions: Follow Nutritious Diet Home Health: New wound care orders this week; continue Home Health for wound care. May utilize formulary equivalent dressing for wound treatment orders unless otherwise specified. [facility] for [facility] care, 2-3x week Other Home Health Orders/Instructions: [facility] Radiology ordered were: X-ray, foot: Left - Non-healing wound, X-ray, ankle: Left - Non-healing wound The following medication(s) was prescribed: Santyl topical 250 unit/gram ointment 1 1 application daily starting 06/26/2021 WOUND #5: - Foot Wound Laterality: Left, Lateral Cleanser: Soap and Water (Home Health) 1 x Per Day/30 Days Discharge Instructions: May shower and wash wound with dial antibacterial soap and water prior to dressing change. Cleanser: Wound Cleanser (Home Health) 1 x Per Day/30 Days Discharge Instructions: Cleanse the wound with wound cleanser prior to applying a clean dressing using gauze sponges, not tissue or cotton balls. Prim Dressing: Santyl Ointment (Home Health) 1 x Per Day/30 Days ary Discharge Instructions: Apply nickel thick amount to wound bed as instructed Secondary Dressing: Woven Gauze Sponges 2x2 in (Home Health) 1 x Per Day/30 Days Discharge Instructions: Apply over primary dressing as directed. Secondary Dressing: Zetuvit Plus Silicone Border Dressing 4x4 (in/in) (Home Health) 1 x Per Day/30 Days Discharge Instructions: Apply silicone border over primary dressing as directed. WOUND #6: - Ankle Wound Laterality: Left, Lateral Cleanser: Soap and Water (Home Health) 1 x Per Day/30 Days Discharge Instructions: May shower and wash wound with dial antibacterial soap and water prior to dressing change. Cleanser: Wound Cleanser (Home Health) 1 x Per Day/30 Days Discharge Instructions: Cleanse the wound with wound cleanser prior to applying a clean  dressing using gauze sponges, not tissue or cotton balls. Prim Dressing: Santyl Ointment (Home Health) 1 x Per Day/30 Days ary Discharge Instructions: Apply nickel thick amount to wound bed as instructed Secondary Dressing: Woven Gauze Sponges 2x2 in (Home Health) 1 x Per Day/30 Days Discharge Instructions: Apply over primary dressing as directed. Secondary Dressing: Zetuvit Plus Silicone Border Dressing 4x4 (in/in) (Home Health) 1 x Per Day/30 Days Discharge Instructions: Apply silicone border over primary dressing as directed. 1. Santyl 2. Aggressive offloading 3. Left foot x-ray 4. Follow-up in 1 week Electronic Signature(s) Signed: 06/26/2021 3:07:44 PM By: Kalman Shan DO Entered By: Kalman Shan on 06/26/2021 15:06:57 -------------------------------------------------------------------------------- HxROS Details Patient Name: Date of Service: Crystal Moore. 06/26/2021 1:15 PM Medical Record Number: [id number] Patient Account Number: [id number] Date of Birth/Sex: Treating RN: 1945-10-22 (75 y.o. Crystal Moore Primary Care Provider: [name] Other Clinician: Referring Provider: Treating Provider/Extender: [name] Weeks in Treatment: 0 Unable to Obtain Patient History due to Aphasia Information Obtained From Patient Integumentary (Skin) Complaints and Symptoms: Positive for: Wounds Eyes Medical History: Positive for: Cataracts - bil removed Respiratory Medical History: Positive for: Chronic Obstructive Pulmonary Disease (COPD) Cardiovascular Medical History: Positive for: Hypertension Past Medical History Notes: CVA Endocrine Medical History: Positive for: Type II Diabetes Time with diabetes: >30 yrs Treated with: Insulin, Oral agents Blood sugar tested every day: No Genitourinary Medical History: Past Medical History Notes: Pyelonephritis Musculoskeletal Medical History: Positive for:  Osteoarthritis Neurologic Medical History: Positive for: Seizure Disorder Past Medical History Notes: left hemiparesis, CVA Psychiatric Medical History: Negative for: Anorexia/bulimia; Confinement  Anxiety HBO Extended History Items Eyes: Cataracts Immunizations Pneumococcal Vaccine: Received Pneumococcal Vaccination: Yes Received Pneumococcal Vaccination On or After 60th Birthday: Yes Implantable Devices None Family and Social History Unknown History: Yes; Former smoker - quit 2 years; Marital Status - Single; Alcohol Use: Never; Drug Use: No History; Caffeine Use: Rarely; Financial Concerns: No; Food, Clothing or Shelter Needs: No; Support System Lacking: No; Transportation Concerns: No Electronic Signature(s) Signed: 06/26/2021 3:07:44 PM By: Kalman Shan DO Signed: 06/26/2021 5:30:54 PM By: Lorrin Jackson Entered By: Lorrin Jackson on 06/26/2021 14:00:17 -------------------------------------------------------------------------------- SuperBill Details Patient Name: Date of Service: Crystal Moore. 06/26/2021 Medical Record Number: [id number] Patient Account Number: [id number] Date of Birth/Sex: Treating RN: 1945/11/19 (75 y.o. F) Primary Care Provider: [name] Other Clinician: Referring Provider: Treating Provider/Extender: [name] Weeks in Treatment: 0 Diagnosis Coding ICD-10 Codes Code Description [phone] Pressure ulcer of other site, stage 3 S91.302A Unspecified open wound, left foot, initial encounter I73.9 Peripheral vascular disease, unspecified E11.621 Type 2 diabetes mellitus with foot ulcer Facility Procedures CPT4 Code: 27253664 Description: [facility] VISIT-LEV 3 EST PT Modifier: 25 Quantity: 1 CPT4 Code: 40347425 Description: 95638 - DEB SUBQ TISSUE 20 SQ CM/< ICD-10 Diagnosis Description S91.302A Unspecified open wound, left foot, initial encounter E11.621 Type 2 diabetes mellitus with foot  ulcer Modifier: Quantity: 1 CPT4 Code: 75643329 Description: 51884 - DEBRIDE WOUND 1ST 20 SQ CM OR < ICD-10 Diagnosis Description S91.302A Unspecified open wound, left foot, initial encounter E11.621 Type 2 diabetes mellitus with foot ulcer Modifier: Quantity: 1 Physician Procedures : CPT4 Code Description Modifier [id number] 16010 - WC PHYS LEVEL 3 - EST PT ICD-10 Diagnosis Description L89.893 Pressure ulcer of other site, stage 3 S91.302A Unspecified open wound, left foot, initial encounter I73.9 Peripheral vascular disease,  unspecified E11.621 Type 2 diabetes mellitus with foot ulcer Quantity: 1 : [id number] 32202 - WC PHYS SUBQ TISS 20 SQ CM 1 ICD-10 Diagnosis Description S91.302A Unspecified open wound, left foot, initial encounter E11.621 Type 2 diabetes mellitus with foot ulcer Quantity: : [id number] 37628 - WC PHYS DEBR WO ANESTH 20 SQ CM 1 ICD-10 Diagnosis Description S91.302A Unspecified open wound, left foot, initial encounter E11.621 Type 2 diabetes mellitus with foot ulcer Quantity: Electronic Signature(s) Signed: 06/26/2021 4:30:51 PM By: Lorrin Jackson Signed: 06/27/2021 9:08:41 AM By: Kalman Shan DO Previous Signature: 06/26/2021 3:07:44 PM Version By: Kalman Shan DO Entered By: Lorrin Jackson on 06/26/2021 16:30:50

## 2021-06-28 ENCOUNTER — Inpatient Hospital Stay (HOSPITAL_COMMUNITY): Payer: Medicare Other

## 2021-06-28 DIAGNOSIS — I6389 Other cerebral infarction: Secondary | ICD-10-CM

## 2021-06-28 DIAGNOSIS — I639 Cerebral infarction, unspecified: Secondary | ICD-10-CM

## 2021-06-28 DIAGNOSIS — R569 Unspecified convulsions: Secondary | ICD-10-CM

## 2021-06-28 LAB — ECHOCARDIOGRAM COMPLETE
AR max vel: 2.05 cm2
AV Area VTI: 1.97 cm2
AV Area mean vel: 1.85 cm2
AV Mean grad: 6 mmHg
AV Peak grad: 10.1 mmHg
Ao pk vel: 1.59 m/s
Area-P 1/2: 3.08 cm2
Calc EF: 53.1 %
Height: 62 in
MV VTI: 2.67 cm2
S' Lateral: 2.6 cm
Single Plane A2C EF: 36.2 %
Single Plane A4C EF: 60.2 %
Weight: 2275.15 oz

## 2021-06-28 LAB — HEMOGLOBIN A1C
Hgb A1c MFr Bld: 8.8 % — ABNORMAL HIGH (ref 4.8–5.6)
Mean Plasma Glucose: 205.86 mg/dL

## 2021-06-28 LAB — GLUCOSE, CAPILLARY
Glucose-Capillary: 110 mg/dL — ABNORMAL HIGH (ref 70–99)
Glucose-Capillary: 170 mg/dL — ABNORMAL HIGH (ref 70–99)
Glucose-Capillary: 84 mg/dL (ref 70–99)
Glucose-Capillary: 126 mg/dL — ABNORMAL HIGH (ref 70–99)

## 2021-06-28 LAB — LIPID PANEL
Cholesterol: 84 mg/dL (ref 0–200)
HDL: 31 mg/dL — ABNORMAL LOW (ref >40)
LDL Cholesterol: 46 mg/dL (ref 0–99)
Total CHOL/HDL Ratio: 2.7 RATIO
Triglycerides: 33 mg/dL (ref <150)
VLDL: 7 mg/dL (ref 0–40)

## 2021-06-28 MED ORDER — AMLODIPINE BESYLATE 5 MG PO TABS
5.0000 mg | ORAL_TABLET | Freq: Every day | ORAL | Status: DC
  Administered 2021-06-28 – 2021-06-30 (×3): 5 mg via ORAL
  Filled 2021-06-28 (×3): qty 1

## 2021-06-28 NOTE — Progress Notes (Signed)
*  PRELIMINARY RESULTS* Echocardiogram 2D Echocardiogram has been performed.  Crystal Moore 06/28/2021, 1:58 PM

## 2021-06-28 NOTE — Plan of Care (Signed)
  Problem: Education: Goal: Knowledge of disease or condition will improve Outcome: Progressing Goal: Knowledge of secondary prevention will improve (SELECT ALL) Outcome: Progressing

## 2021-06-28 NOTE — Progress Notes (Signed)
OT Cancellation Note  Patient Details Name: Crystal Moore MRN: [id number] DOB: July 09, 1946   Cancelled Treatment:    Reason Eval/Treat Not Completed: Active bedrest order. Will follow up as able.  Helane Gunther, OT/L  Acute [facility] [place] 06/28/2021, 7:12 AM

## 2021-06-28 NOTE — Evaluation (Signed)
Physical Therapy Evaluation Patient Details Name: Crystal Moore MRN: [id number] DOB: Jan 28, 1946 Today's Date: 06/28/2021  History of Present Illness  Pt is a 75 y.o. F who presents 06/27/2021 with sepsis secondary to flu, CVA vs seizure with Todds paralysis. MRI showing acute punctuate cerebellar infarct. report of noncompliance vs discontinuation of seizure medications.Significant PMH: chronic left foot/ankle wound, diabetes, peripheral vascular disease, COPD, former  tobacco use disorder, history of CVA, OSA, dementia, gout, essential hypertension, recent LLE arteriogram and arterial stenting.  Clinical Impression  Pt admitted with above. Pt received with large bowel incontinence. Performed bed mobility to provide peri care and bed linen change. Pt requiring up to two person mod-max assist for functional mobility. Stood from edge of bed x 2, but is unable to ambulate due to crouched posture and posterior lean. SpO2 99% on 2L O2. Pt demonstrates generalized weakness, residual L hemiplegia, poor balance, cognitive deficits (likely baseline). Recommend SNF at discharge.      Recommendations for follow up therapy are one component of a multi-disciplinary discharge planning process, led by the attending physician.  Recommendations may be updated based on patient status, additional functional criteria and insurance authorization.  Follow Up Recommendations Skilled nursing-short term rehab (<3 hours/day)    Assistance Recommended at Discharge Frequent or constant Supervision/Assistance  Functional Status Assessment Patient has had a recent decline in their functional status and demonstrates the ability to make significant improvements in function in a reasonable and predictable amount of time.  Equipment Recommendations  None recommended by PT    Recommendations for Other Services       Precautions / Restrictions Precautions Precautions: Fall;Other (comment) Precaution Comments: L hemiplegic  (chronic) Restrictions Weight Bearing Restrictions: No      Mobility  Bed Mobility Overal bed mobility: Needs Assistance Bed Mobility: Rolling;Sidelying to Sit;Sit to Supine Rolling: Mod assist Sidelying to sit: Max assist   Sit to supine: Mod assist   General bed mobility comments: Pt requiring mod-maxA for bed mobility, step by step cues    Transfers Overall transfer level: Needs assistance Equipment used: None Transfers: Sit to/from Stand Sit to Stand: Mod assist;+2 physical assistance           General transfer comment: ModA + 2 to power up from edge of bed x 2. Pt with increased trunk/hip/knee flexion; unable to achieve full upright position    Ambulation/Gait                  Stairs            Wheelchair Mobility    Modified Rankin (Stroke Patients Only) Modified Rankin (Stroke Patients Only) Pre-Morbid Rankin Score: Moderately severe disability Modified Rankin: Severe disability     Balance Overall balance assessment: Needs assistance Sitting-balance support: Feet supported Sitting balance-Leahy Scale: Poor Sitting balance - Comments: reliant on single UE support, close supervision   Standing balance support: Bilateral upper extremity supported;During functional activity Standing balance-Leahy Scale: Zero                               Pertinent Vitals/Pain Pain Assessment: Faces Faces Pain Scale: No hurt    Home Living Family/patient expects to be discharged to:: Private residence Living Arrangements: Children (daughter) Available Help at Discharge: Family;Available 24 hours/day Type of Home: House Home Access: Stairs to enter   [employer] of Steps: 1   Home Layout: One level Home Equipment: Cane - single point;Wheelchair - manual  Additional Comments: Info from 08/2020 chart    Prior Function Prior Level of Function : Needs assist             Mobility Comments: uses cane vs HHA, very limited  distance ADLs Comments: aide 8 hours/day, washes at sink     Hand Dominance   Dominant Hand: Right    Extremity/Trunk Assessment   Upper Extremity Assessment Upper Extremity Assessment: Defer to OT evaluation    Lower Extremity Assessment Lower Extremity Assessment: Generalized weakness;LLE deficits/detail LLE Deficits / Details: Residual deficits from prior stroke, ROM WFL       Communication   Communication: Expressive difficulties  Cognition Arousal/Alertness: Awake/alert Behavior During Therapy: Flat affect Overall Cognitive Status: History of cognitive impairments - at baseline                                 General Comments: Minimal verbalizations at baseline, able to follow 1 step commands with increased time/multimodal cueing        General Comments      Exercises     Assessment/Plan    PT Assessment Patient needs continued PT services  PT Problem List Decreased strength;Decreased balance;Decreased mobility;Decreased cognition;Decreased safety awareness       PT Treatment Interventions DME instruction;Gait training;Functional mobility training;Therapeutic activities;Therapeutic exercise;Balance training;Patient/family education    PT Goals (Current goals can be found in the Care Plan section)  Acute Rehab PT Goals Patient Stated Goal: unable PT Goal Formulation: Patient unable to participate in goal setting Time For Goal Achievement: 07/12/21 Potential to Achieve Goals: Fair    Frequency Min 3X/week   Barriers to discharge        Co-evaluation               AM-PAC PT "6 Clicks" Mobility  Outcome Measure Help needed turning from your back to your side while in a flat bed without using bedrails?: A Lot Help needed moving from lying on your back to sitting on the side of a flat bed without using bedrails?: A Lot Help needed moving to and from a bed to a chair (including a wheelchair)?: Total Help needed standing up from a  chair using your arms (e.g., wheelchair or bedside chair)?: Total Help needed to walk in hospital room?: Total Help needed climbing 3-5 steps with a railing? : Total 6 Click Score: 8    End of Session Equipment Utilized During Treatment: Gait belt Activity Tolerance: Patient tolerated treatment well Patient left: in bed;with call bell/phone within reach;with bed alarm set;with nursing/sitter in room Nurse Communication: Mobility status PT Visit Diagnosis: Difficulty in walking, not elsewhere classified (R26.2);Unsteadiness on feet (R26.81)    Time: 1103-1594 PT Time Calculation (min) (ACUTE ONLY): 20 min   Charges:   PT Evaluation $PT Eval Moderate Complexity: 1 Mod          Wyona Almas, PT, DPT Acute Rehabilitation Services Pager [phone] Office [phone]   Deno Etienne 06/28/2021, 12:50 PM

## 2021-06-28 NOTE — Progress Notes (Signed)
PT Cancellation Note  Patient Details Name: Crystal Moore MRN: [id number] DOB: 1945-11-05   Cancelled Treatment:    Reason Eval/Treat Not Completed: Active bedrest order  Wyona Almas, PT, DPT Acute Rehabilitation Services Pager [phone] Office [phone]    Deno Etienne 06/28/2021, 8:55 AM

## 2021-06-28 NOTE — Progress Notes (Signed)
Neurology Progress Note  Brief HPI: 75 y.o. female with a PMHx of COPD, DM, seizures, dementia, pyelonephritis, PVD, tobacco use, prior UTI, GIB, s/p left femoral and left popliteal artery stent placement, chronic left sided weakness 2/2 CVA, wheelchair bound at baseline, who presented to the ED 12/2 as a Code Stroke for evaluation of acute mutism, left facial droop, and left sided drooling.   ED course: Temperature maximum 102 F, hypotension requiring 4 L of IVF, influenza A+, mild hypoxia with SPO2 88% on room air s/p ceftriaxone and azithromycin prior to influenza results.  Subjective: Fever of 101.2 this morning MRI brain complete overnight with punctate acute infarct in the left cerebellar hemisphere  Exam: Vitals:   06/28/21 0355 06/28/21 0700  BP: (!) 139/46 (!) 162/62  Pulse: 63 75  Resp: 13 13  Temp: 98.2 F (36.8 C) (!) 101.2 F (38.4 C)  SpO2: 99% 100%   Gen: Sitting up comfortably in bed, in no acute distress Resp: non-labored breathing, no respiratory distress on nasal cannula Abd: soft, non-tender, nondistended  Neuro: Mental Status: Awake with increased latencies of verbal and motor responses.  She answers in one-word responses and is nonfluent.  Naming is intact.  Poor attention noted. Patient follows simple commands with repeat instruction. Patient is able to state her name, that she is in the hospital, and that the month is December.  She incorrectly states that the year is 2004, and that she is 75 years old.  She is unable provide details regarding her history of presentation. Patient is dysarthric in the setting of edentulous state.  Cranial Nerves:PERRL, EOMI, chronic left-sided visual field cut per patient's daughter, left mouth droop, hearing is intact to voice, palate raises symmetrically, tongue slightly leftward of midline. Motor: Patient is able to elevate right upper extremity antigravity but vertical drift with constant coaching, she elevates right lower  extremity antigravity for 5 seconds with constant coaching.  Left upper extremity with increased tone and without spontaneous movement.  Left lower extremity also has increased tone at the knee and hip with minimal withdrawal to noxious stimuli. Sensory:Reacts to stimulation x 4. Patient provides an unreliable report as she states that she cannot feel examiner touching her left upper or lower extremity but winces with any manipulation of that extremity. Gait: Deferred for patient safety as patient is wheelchair-bound at baseline  Pertinent Labs: CBC    Component Value Date/Time   WBC 8.9 06/27/2021 1050   RBC 5.10 06/27/2021 1050   HGB 13.2 06/27/2021 1050   HCT 41.9 06/27/2021 1050   PLT 210 06/27/2021 1050   MCV 82.2 06/27/2021 1050   MCH 25.9 (L) 06/27/2021 1050   MCHC 31.5 06/27/2021 1050   RDW 17.1 (H) 06/27/2021 1050   LYMPHSABS 2.0 06/27/2021 1050   MONOABS 1.0 06/27/2021 1050   EOSABS 0.0 06/27/2021 1050   BASOSABS 0.0 06/27/2021 1050   CMP     Component Value Date/Time   NA 135 06/27/2021 2116   K 3.6 06/27/2021 2116   CL 103 06/27/2021 2116   CO2 23 06/27/2021 2116   GLUCOSE 200 (H) 06/27/2021 2116   BUN 17 06/27/2021 2116   CREATININE 0.96 06/27/2021 2116   CALCIUM 8.0 (L) 06/27/2021 2116   PROT 6.2 (L) 06/27/2021 2116   ALBUMIN 2.5 (L) 06/27/2021 2116   AST 37 06/27/2021 2116   ALT 28 06/27/2021 2116   ALKPHOS 73 06/27/2021 2116   BILITOT 0.6 06/27/2021 2116   GFRNONAA >60 06/27/2021 2116   GFRAA >  60 01/16/2019 0714   Drugs of Abuse     Component Value Date/Time   LABOPIA NONE DETECTED 06/27/2021 0948   COCAINSCRNUR NONE DETECTED 06/27/2021 0948   LABBENZ NONE DETECTED 06/27/2021 0948   AMPHETMU NONE DETECTED 06/27/2021 0948   THCU NONE DETECTED 06/27/2021 0948   LABBARB NONE DETECTED 06/27/2021 0948    Alcohol Level    Component Value Date/Time   ETH <10 06/27/2021 1050   Urinalysis    Component Value Date/Time   COLORURINE STRAW (A) 01/30/2021  1908   APPEARANCEUR CLEAR 01/30/2021 1908   LABSPEC 1.024 01/30/2021 1908   PHURINE 6.0 01/30/2021 1908   GLUCOSEU >=500 (A) 01/30/2021 1908   HGBUR NEGATIVE 01/30/2021 1908   BILIRUBINUR NEGATIVE 01/30/2021 1908   KETONESUR NEGATIVE 01/30/2021 1908   PROTEINUR NEGATIVE 01/30/2021 1908   NITRITE NEGATIVE 01/30/2021 1908   LEUKOCYTESUR NEGATIVE 01/30/2021 1908   Lab Results  Component Value Date   HGBA1C 8.8 (H) 06/28/2021   Lab Results  Component Value Date   CHOL 84 06/28/2021   HDL 31 (L) 06/28/2021   LDLCALC 46 06/28/2021   TRIG 33 06/28/2021   CHOLHDL 2.7 06/28/2021    Latest Reference Range & Units 06/27/21 11:07  Influenza A By PCR NEGATIVE  POSITIVE !  Influenza B By PCR NEGATIVE  NEGATIVE  SARS Coronavirus 2 by RT PCR NEGATIVE  NEGATIVE   Imaging Reviewed:  CT head wo contrast 12/2: Motion degraded exam. No acute intracranial abnormality is identified.  ASPECTS is 10. Redemonstrated parenchymal atrophy, chronic ischemic changes and chronic infarcts, as detailed.  CT angio head neck wwo 12/2: 1. The common carotid and internal carotid arteries are patent within the neck. Atherosclerotic stenosis at the origin of the right ICA estimated at 80%, progressed from the prior examination of 12/17/2020. Atherosclerotic stenosis at the origin of the left ICA estimated at 90%, also progressed. 2. The dominant right vertebral artery is patent within the neck with unchanged mild atherosclerotic narrowing at the origin of this vessel. 3. 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 Distal to this, there is irregular and faint opacification of the left vertebral artery, likely due to retrograde flow. 4. Irregular opacities within the bilateral upper lobes measuring up to 4.5 cm, new from prior examinations. While these findings may be infectious/inflammatory in etiology, they are suspicious for possible pulmonary neoplasm. A dedicated  contrast-enhanced chest CT is recommended for initial further evaluation.  MRI brain wo contrast 12/2: 1. Punctate acute infarct in the left cerebellar hemisphere. 2. Global parenchymal volume loss with right worse than left hippocampal atrophy and chronic white matter microangiopathy. 3. Remote infarcts as above.  EEG 12/2: "This study showed evidence of epileptogenicity and cortical dysfunction arising from right hemisphere, maximal right frontal region likely secondary to underlying structural abnormality and high risk for seizure recurrence . Additionally EEG suggestive of mild diffuse encephalopathy, nonspecific etiology.  No seizures were seen throughout the recording."  Assessment: 75 year old female with chronic left sided weakness 2/2 prior stroke, presenting as a Code Stroke with acute onset of mutism, left facial droop and left sided drooling. The patient is wheelchair-bound at baseline.  1. Exam reveals findings referable to a chronic right cerebral hemisphere lesion in conjunction with diffuse bihemispheric dysfunction suggestive of a toxic/metabolic/infectious etiology for her acute decompensation.  2. Daughter states patient has had 7 strokes in the past. Her second stroke was the one leaving the patient with left hemiplegia.  3.  CT head, CTA head and neck as above 4. EEG official report conclusions: - Spike, right hemisphere, maximal right frontal region - Intermittent slow, generalized and maximal right frontal region. Impression - Evidence of epileptogenicity and cortical dysfunction arising from right hemisphere, maximal right frontal region likely secondary to underlying structural abnormality and high risk for seizure recurrence . Additionally EEG suggestive of mild diffuse encephalopathy, nonspecific etiology.  No seizures were seen throughout the recording. 5. MRI brain as above with punctate acute infarct in the left cerebellar hemisphere, felt to be an incidental finding. 6.  DDx for presentation: High on the differential is a partial complex seizure with postictal state in the setting of acute infection as the patient tested positive for influenza A by PCR. Daughter states she has had similar spells in the past that were stereotyped, diagnosed as seizures and treated with an anticonvulsant. The anticonvulsant was stopped about 3 years ago. Daughter is unable to recall the name of the medication. Based on the EEG findings, the most likely etiology for her presentation is recurrence of the partial complex seizure activity that daughter states she has had in the past. On interview, the daughter also feels that the event occurring at home today was most likely a seizure. Patient was loaded with 750 mg valproic acid then started on a low-dose oral valproic acid maintenance dose at 250 mg PO TID.  Recommendations: - Continue VPA 250 mg PO TID - AM VPA level - Continue seizure precautions - Echocardiogram to complete stroke work up with MRI findings of punctate incidental stroke - Evaluation and management of infection per primary team - Continue home clopidogrel and atorvastatin due to GIB on ASA + clopidogrel - Will need to follow up with outpatient neurology for further AED management  Anibal Henderson, AGACNP-BC [facility] [phone]  Electronically signed: Dr. Kerney Elbe

## 2021-06-28 NOTE — Progress Notes (Signed)
PROGRESS NOTE    Crystal Moore  IWL:[id number] DOB: 1946-07-13 DOA: 06/27/2021 PCP: [name], MD   Brief Narrative:  Crystal Moore is a 75 y.o. female with medical history significant of PVD with B/L leg stenting on ASA and Plavix, IDDM, seizure off anti-seizure meds, Stroke with residue left sided weakness, chronic ambulation dysfunction,COPD came with fever and stoke like symptoms with slurred speech and worsening L sided facial droop first noted over a week ago.  Assessment & Plan:   Sepsis secondary flu, POA Acute hypoxic respiratory failure, POA -Continue Tamiflu -Continue supportive care, supplemental oxygen, wean as tolerated   CVA vs seizure with Todds paralysis Acute punctate cerebellar infarct Acute metabolic encephalopathy -unknown baseline -Chronic left-sided weakness from previous stroke -Acute punctate cerebellar infarct on MRI -Neurology following, appreciate insight and recommendations -Continue Plavix, statin -off aspirin due to previous history of GI bleed -EEG/further imaging per neurology -Report of noncompliance versus discontinuation of seizure medications  IDDM -A1c 8.7 indicative of poorly controlled diabetes with hyperglycemia  -Continue home long-acting 15 units at bedtime with sliding scale, hypoglycemic protocol  -Continue Tradjenta   Chronic left foot/ankle wound and chronic ambulation dysfunction -With a baseline PVD and stenting in leg -PT/wound care following   COPD, not in acute exacerbation -Continue home Proventil, Ventolin   DVT prophylaxis: Heparin subcu Code Status: Full code Family Communication: None present  Status is: Inpatient  Dispo: The patient is from: Home              Anticipated d/c is to: To be determined              Anticipated d/c date is: 24 to 48 hours              Patient currently not medically stable for discharge  Consultants:  Neuro  Procedures:  None  Antimicrobials:   Tamiflu  Subjective: No acute issues or events overnight  Objective: Vitals:   06/27/21 2351 06/28/21 0035 06/28/21 0343 06/28/21 0355  BP: (!) 134/40 (!) 135/46 (!) 116/102 (!) 139/46  Pulse: 69 67 63 63  Resp: 17  17 13   Temp: 98 F (36.7 C)  98.2 F (36.8 C) 98.2 F (36.8 C)  TempSrc: Oral  Oral Oral  SpO2: 98%  99% 99%  Weight:      Height:        Intake/Output Summary (Last 24 hours) at 06/28/2021 0725 Last data filed at 06/28/2021 0411 Gross per 24 hour  Intake 1345.52 ml  Output --  Net 1345.52 ml   Filed Weights   06/27/21 0900 06/27/21 2043  Weight: 65.4 kg 64.5 kg    Examination:  General:  Pleasantly resting in bed, No acute distress. HEENT:  Normocephalic atraumatic.  Sclerae nonicteric, noninjected.  Extraocular movements intact bilaterally. Neck:  Without mass or deformity.  Trachea is midline. Lungs:  Clear to auscultate bilaterally without rhonchi, wheeze, or rales. Heart:  Regular rate and rhythm.  Without murmurs, rubs, or gallops. Abdomen:  Soft, nontender, nondistended.  Without guarding or rebound. Extremities: Without cyanosis, clubbing, edema, or obvious deformity. Vascular:  Dorsalis pedis and posterior tibial pulses palpable bilaterally. Skin:  Warm and dry, no erythema.   Data Reviewed: I have personally reviewed following labs and imaging studies  CBC: Recent Labs  Lab 06/27/21 0951 06/27/21 1050  WBC  --  8.9  NEUTROABS  --  5.8  HGB 14.6 13.2  HCT 43.0 41.9  MCV  --  82.2  PLT  --  628   Basic Metabolic Panel: Recent Labs  Lab 06/27/21 0951 06/27/21 2116  NA 140 135  K 4.2 3.6  CL 102 103  CO2  --  23  GLUCOSE 147* 200*  BUN 23 17  CREATININE 1.10* 0.96  CALCIUM  --  8.0*   GFR: Estimated Creatinine Clearance: 45.4 mL/min (by C-G formula based on SCr of 0.96 mg/dL). Liver Function Tests: Recent Labs  Lab 06/27/21 2116  AST 37  ALT 28  ALKPHOS 73  BILITOT 0.6  PROT 6.2*  ALBUMIN 2.5*   No results for  input(s): LIPASE, AMYLASE in the last 168 hours. No results for input(s): AMMONIA in the last 168 hours. Coagulation Profile: Recent Labs  Lab 06/27/21 1050  INR 1.1   Cardiac Enzymes: No results for input(s): CKTOTAL, CKMB, CKMBINDEX, TROPONINI in the last 168 hours. BNP (last 3 results) No results for input(s): PROBNP in the last 8760 hours. HbA1C: Recent Labs    06/27/21 2116 06/28/21 0107  HGBA1C 8.7* 8.8*   CBG: Recent Labs  Lab 06/27/21 0945 06/27/21 1717 06/27/21 2113 06/28/21 0603  GLUCAP 153* 138* 172* 110*   Lipid Profile: Recent Labs    06/28/21 0107  CHOL 84  HDL 31*  LDLCALC 46  TRIG 33  CHOLHDL 2.7   Thyroid Function Tests: No results for input(s): TSH, T4TOTAL, FREET4, T3FREE, THYROIDAB in the last 72 hours. Anemia Panel: No results for input(s): VITAMINB12, FOLATE, FERRITIN, TIBC, IRON, RETICCTPCT in the last 72 hours. Sepsis Labs: Recent Labs  Lab 06/27/21 1050  LATICACIDVEN 1.7    Recent Results (from the past 240 hour(s))  Resp Panel by RT-PCR (Flu A&B, Covid) Nasopharyngeal Swab     Status: Abnormal   Collection Time: 06/27/21 11:07 AM   Specimen: Nasopharyngeal Swab; Nasopharyngeal(NP) swabs in vial transport medium  Result Value Ref Range Status   SARS Coronavirus 2 by RT PCR NEGATIVE NEGATIVE Final    Comment: (NOTE) SARS-CoV-2 target nucleic acids are NOT DETECTED.  The SARS-CoV-2 RNA is generally detectable in upper respiratory specimens during the acute phase of infection. The lowest concentration of SARS-CoV-2 viral copies this assay can detect is 138 copies/mL. A negative result does not preclude SARS-Cov-2 infection and should not be used as the sole basis for treatment or other patient management decisions. A negative result may occur with  improper specimen collection/handling, submission of specimen other than nasopharyngeal swab, presence of viral mutation(s) within the areas targeted by this assay, and inadequate  number of viral copies(<138 copies/mL). A negative result must be combined with clinical observations, patient history, and epidemiological information. The expected result is Negative.  Fact Sheet for Patients:  EntrepreneurPulse.com.au  Fact Sheet for Healthcare Providers:  IncredibleEmployment.be  This test is no t yet approved or cleared by the [place] FDA and  has been authorized for detection and/or diagnosis of SARS-CoV-2 by FDA under an Emergency Use Authorization (EUA). This EUA will remain  in effect (meaning this test can be used) for the duration of the COVID-19 declaration under Section 564(b)(1) of the Act, 21 U.S.C.section 360bbb-3(b)(1), unless the authorization is terminated  or revoked sooner.       Influenza A by PCR POSITIVE (A) NEGATIVE Final   Influenza B by PCR NEGATIVE NEGATIVE Final    Comment: (NOTE) The Xpert Xpress SARS-CoV-2/FLU/RSV plus assay is intended as an aid in the diagnosis of influenza from Nasopharyngeal swab specimens and should not be used as a sole basis for treatment. Nasal washings and  aspirates are unacceptable for Xpert Xpress SARS-CoV-2/FLU/RSV testing.  Fact Sheet for Patients: EntrepreneurPulse.com.au  Fact Sheet for Healthcare Providers: IncredibleEmployment.be  This test is not yet approved or cleared by the [place] FDA and has been authorized for detection and/or diagnosis of SARS-CoV-2 by FDA under an Emergency Use Authorization (EUA). This EUA will remain in effect (meaning this test can be used) for the duration of the COVID-19 declaration under Section 564(b)(1) of the Act, 21 U.S.C. section 360bbb-3(b)(1), unless the authorization is terminated or revoked.  Performed at [facility], [place] [address] [place], [place] [address]          Radiology Studies: MR BRAIN WO CONTRAST  Result Date: 06/27/2021 CLINICAL DATA:   Slurred speech, left facial droop EXAM: MRI HEAD WITHOUT CONTRAST TECHNIQUE: Multiplanar, multiecho pulse sequences of the brain and surrounding structures were obtained without intravenous contrast. COMPARISON:  Same-day CT/CTA head and neck FINDINGS: Image quality is intermittently degraded by motion artifact. Specifically, the axial T1 images are moderately motion degraded. Brain: There is a punctate focus of diffusion restriction in the left cerebellar hemisphere consistent with acute infarct. There is no other evidence of acute infarct. There is no acute intracranial hemorrhage or extra-axial fluid collection. There is global parenchymal volume loss with marked enlargement of the ventricular system. There is confluent FLAIR signal abnormality in the subcortical and periventricular white matter likely reflecting sequela of chronic white matter microangiopathy. There is a remote infarct in the right ACA distribution, unchanged. There are small remote infarcts in the bilateral cerebellar hemispheres and right cerebral peduncle. There is no solid mass lesion.  There is no midline shift. Vascular: Normal flow voids. Skull and upper cervical spine: Normal marrow signal. Sinuses/Orbits: The paranasal sinuses are clear. The globes and orbits are unremarkable. Other: None. IMPRESSION: 1. Punctate acute infarct in the left cerebellar hemisphere. 2. Global parenchymal volume loss with right worse than left hippocampal atrophy and chronic white matter microangiopathy. 3. Remote infarcts as above. Electronically Signed   By: Valetta Mole M.D.   On: 06/27/2021 16:55   DG Chest Port 1 View  Result Date: 06/27/2021 CLINICAL DATA:  Fever.  Stroke evaluation. EXAM: PORTABLE CHEST 1 VIEW COMPARISON:  Radiographs 09/14/2020 and 08/26/2020.  CT 02/15/2018. FINDINGS: 1113 hours. Patient is rotated to the left, and the extreme right lung apex is excluded. The heart size and mediastinal contours are stable with aortic  atherosclerosis. Interstitial prominence has increased, suspicious for increased pulmonary edema. There is no confluent airspace opacity, significant pleural effusion or pneumothorax. The bones appear unchanged. Multiple telemetry leads overlie the chest. IMPRESSION: Interval diffusely increased interstitial prominence suspicious for interstitial pulmonary edema. Electronically Signed   By: Richardean Sale M.D.   On: 06/27/2021 11:34   DG Foot 2 Views Left  Result Date: 06/27/2021 CLINICAL DATA:  Generic left foot pain EXAM: LEFT FOOT - 2 VIEW COMPARISON:  None. FINDINGS: No acute fracture or dislocation. No aggressive osseous lesion. Normal alignment. Generalized osteopenia. Mild osteoarthritis of the first MTP joint and first IP joint. Small plantar calcaneal spur. Enthesopathic changes at the Achilles tendon insertion. In Soft tissue are unremarkable. No radiopaque foreign body or soft tissue emphysema. IMPRESSION: No acute osseous injury of the left foot. Electronically Signed   By: Kathreen Devoid M.D.   On: 06/27/2021 14:09   EEG adult  Result Date: 06/27/2021 Lora Havens, MD     06/27/2021  1:02 PM Patient Name: Crystal Moore MRN: [id number] Epilepsy Attending:  Lora Havens Referring Physician/Provider: Dr Kerney Elbe Date: 06/27/2021 Duration: 36.34 mins Patient history: 75 year old female with chronic left sided weakness secondary to prior stroke, presenting as a Code Stroke with acute onset of mutism, left facial droop and left sided drooling.  EEG to evaluate for seizure. Level of alertness: Awake AEDs during EEG study: Technical aspects: This EEG study was done with scalp electrodes positioned according to the 10-20 International system of electrode placement. Electrical activity was acquired at a sampling rate of 500Hz  and reviewed with a high frequency filter of 70Hz  and a low frequency filter of 1Hz . EEG data were recorded continuously and digitally stored. Description: The posterior  dominant rhythm consists of 8 Hz activity of moderate voltage (25-35 uV) seen predominantly in posterior head regions, symmetric and reactive to eye opening and eye closing. Drowsiness was characterized by attenuation of the posterior background rhythm. EEG showed intermittent generalized and maximal right frontal region 3 to 6 Hz theta-delta slowing.  Spikes were also noted in the right hemisphere, maximal right frontal region which at times appeared quasiperiodic at 1 Hz. Hyperventilation and photic stimulation were not performed.   ABNORMALITY - Spike, right hemisphere, maximal right frontal region - Intermittent slow, generalized and maximal right frontal region IMPRESSION: This study showed evidence of epileptogenicity and cortical dysfunction arising from right hemisphere, maximal right frontal region likely secondary to underlying structural abnormality and high risk for seizure recurrence . Additionally EEG suggestive of mild diffuse encephalopathy, nonspecific etiology.  No seizures were seen throughout the recording. Dr Cheral Marker was notified Lora Havens   CT HEAD CODE STROKE WO CONTRAST  Result Date: 06/27/2021 CLINICAL DATA:  Code stroke. Neuro deficit, acute, stroke suspected. Additional history provided: Left-sided deficit, slurred speech, nonambulatory. EXAM: CT HEAD WITHOUT CONTRAST TECHNIQUE: Contiguous axial images were obtained from the base of the skull through the vertex without intravenous contrast. COMPARISON:  Head CT 01/30/2021 and earlier. Brain MRI 01/03/2019. MRA head 01/14/2019. FINDINGS: Brain: Mild generalized cerebral and cerebellar atrophy. Redemonstrated chronic right ACA territory cortical/subcortical infarct within the medial right frontal lobe. Redemonstrated chronic infarcts within the bilateral cerebral hemispheric white matter, deep gray nuclei, posterior limb of right internal capsule and right midbrain. Background advanced patchy and confluent hypoattenuation within  the cerebral white matter, nonspecific but compatible with chronic small vessel ischemic disease. Redemonstrated small chronic infarcts within the bilateral cerebellar hemispheres. There is no acute intracranial hemorrhage. No acute demarcated cortical infarct is identified. No extra-axial fluid collection. No evidence of an intracranial mass. No midline shift. Vascular: No hyperdense vessel.  Atherosclerotic calcifications. Skull: Normal. Negative for fracture or focal lesion. Sinuses/Orbits: Visualized orbits show no acute finding. mild mucosal thickening within the bilateral ethmoid and left maxillary sinuses at the imaged levels. ASPECTS [facility] Stroke Program Early CT Score) - Ganglionic level infarction (caudate, lentiform nuclei, internal capsule, insula, M1-M3 cortex): 7 - Supraganglionic infarction (M4-M6 cortex): 3 Total score (0-10 with 10 being normal): 10 These results were communicated to Dr. Cheral Marker at 10:14amon 12/2/2022by text page via the [facility] messaging system. IMPRESSION: Motion degraded exam. No acute intracranial abnormality is identified.  ASPECTS is 10. Redemonstrated parenchymal atrophy, chronic ischemic changes and chronic infarcts, as detailed. Electronically Signed   By: Kellie Simmering D.O.   On: 06/27/2021 10:17   CT ANGIO HEAD NECK W WO CM (CODE STROKE)  Addendum Date: 06/27/2021   ADDENDUM REPORT: 06/27/2021 19:29 ADDENDUM: CTA neck impression #4 will be called to the hospitalist provider by the Radiologist  Environmental consultant, and communication documented in the PACS or [employer]. Electronically Signed   By: Kellie Simmering D.O.   On: 06/27/2021 19:29   Result Date: 06/27/2021 CLINICAL DATA:  Neuro deficit, acute, stroke suspected. Aside deficit, slurred speech, nonambulatory. EXAM: CT ANGIOGRAPHY HEAD AND NECK TECHNIQUE: Multidetector CT imaging of the head and neck was performed using the standard protocol during bolus administration of intravenous contrast. Multiplanar CT image  reconstructions and MIPs were obtained to evaluate the vascular anatomy. Carotid stenosis measurements (when applicable) are obtained utilizing NASCET criteria, using the distal internal carotid diameter as the denominator. CONTRAST:  35mL OMNIPAQUE IOHEXOL 350 MG/ML SOLN COMPARISON:  Noncontrast head CT performed earlier today 06/27/2021. MRA head 01/14/2019. CT angiogram head/neck 12/18/2018. Chest CT 02/15/2018 FINDINGS: CTA NECK FINDINGS Aortic arch: Common origin of the innominate and left common carotid arteries. Atherosclerotic plaque within the visualized aortic arch and proximal major branch vessels of the neck. Streak and beam hardening artifact arising from a dense right-sided contrast bolus partially obscures the right subclavian artery. Within this is a shin, no hemodynamically significant stenosis is identified within the innominate or proximal right subclavian artery. Atherosclerotic plaque within the left subclavian artery results in up to 50% stenosis, unchanged. Right carotid system: CCA and ICA patent within the neck. Prominent soft and calcified plaque within the carotid bifurcation and proximal ICA. Resultant severe stenosis at the origin of the right ICA estimated at 80%, slightly progressed as compared to the prior examination of 12/18/2018. Distal to this, the ICA is patent within the neck without stenosis. Left carotid system: CCA and ICA patent within the neck. Scattered soft and calcified plaque within the left carotid system. Most notably, there is prominent soft calcified plaque within the carotid bifurcation and proximal ICA. Estimated stenosis at the origin of the left ICA of 90%, progressed. Distal to this, the ICA is patent within the neck without stenosis. Vertebral arteries: The dominant right vertebral artery is patent within the neck. Unchanged mild atherosclerotic narrowing at the origin of this vessel. As before, the non dominant left vertebral artery is occluded within the  neck to the C5 level. Distal to this, there is irregular and faint opacification of the left vertebral artery, likely due to retrograde flow. Skeleton: Cervical spondylosis. No acute bony abnormality or aggressive osseous lesion. Other neck: Subcentimeter nodules within the thyroid gland, not meeting consensus criteria for ultrasound follow-up based on size. No neck mass or cervical lymphadenopathy. Upper chest: Redemonstrated findings of fibrotic lung disease. New from prior examinations, there is an irregular opacity within the right upper lobe measuring 2.9 cm. Also new from prior exams, and irregular opacity is present within the left upper lobe measuring up to 4.5 cm in greatest dimension. These findings may be infectious/inflammatory, but are suspicious for possible neoplasm. Review of the MIP images confirms the above findings CTA HEAD FINDINGS Anterior circulation: The intracranial internal carotid arteries are patent. Calcified plaque within both vessels. Up to moderate stenosis within the cavernous right ICA. Moderate/severe stenosis within the distal petrous/proximal cavernous left ICA, progressed. The M1 middle cerebral arteries are patent. Atherosclerotic irregularity of the M2 and more distal middle cerebral artery vessels, bilaterally, most notably as follows. Redemonstrated severe stenosis within a mid to distal M2 right MCA vessel (series 10, image 17). Redemonstrated moderate stenosis within a superior division proximal M2 left MCA vessel. Sites of progressive severe stenosis within a distal M2 left MCA vessel. The anterior cerebral arteries are patent. Redemonstrated mild to moderate stenosis  within the right A1 segment. No intracranial aneurysm is identified. Posterior circulation: The dominant intracranial right vertebral artery is patent. Mild nonstenotic calcified plaque within this vessel. The intracranial left vertebral artery is developmentally diminutive with superimposed foci of  high-grade stenosis. The basilar artery is patent. The posterior cerebral arteries are patent. Atherosclerotic irregularity of both vessels. Redemonstrated moderate stenosis within the P2 right PCA. Progressive severe stenosis within the right PCA P3 segment. Redemonstrated moderate stenosis within the P2 left PCA. Posterior communicating arteries are diminutive or absent bilaterally. Venous sinuses: Within the limitations of contrast timing, no convincing thrombus. Anatomic variants: As described. Review of the MIP images confirms the above findings No emergent large vessel occlusion. These results were communicated to Dr. Cheral Marker At 10:59 amon 12/2/2022by text page via the [facility] messaging system. IMPRESSION: CTA neck: 1. The common carotid and internal carotid arteries are patent within the neck. Atherosclerotic stenosis at the origin of the right ICA estimated at 80%, progressed from the prior examination of 12/17/2020. Atherosclerotic stenosis at the origin of the left ICA estimated at 90%, also progressed. 2. The dominant right vertebral artery is patent within the neck with unchanged mild atherosclerotic narrowing at the origin of this vessel. 3. 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 Distal to this, there is irregular and faint opacification of the left vertebral artery, likely due to retrograde flow. 4. Irregular opacities within the bilateral upper lobes measuring up to 4.5 cm, new from prior examinations. While these findings may be infectious/inflammatory in etiology, they are suspicious for possible pulmonary neoplasm. A dedicated contrast-enhanced chest CT is recommended for initial further evaluation. CTA head: 1. No intracranial large vessel occlusion is identified. 2. Intracranial atherosclerotic disease with multifocal stenoses, most notably as follows. 3. Redemonstrated sites of up to moderate stenosis within the cavernous right ICA. 4. Moderate/severe stenosis  within the distal petrous/proximal cavernous left ICA, progressed. 5. Redemonstrated severe stenosis within a mid to distal right M2 MCA vessel. 6. Redemonstrated moderate stenosis within a superior division proximal M2 left MCA vessel. 7. Sites of progressive severe stenosis within a distal M2 left MCA vessel. 8. Redemonstrated sites of severe stenosis within the V4 left vertebral artery. 9. Redemonstrated moderate stenosis within the P2 right PCA. 10. Progressive severe stenosis within the P3 right PCA. 11. Redemonstrated moderate stenosis within the P2 left PCA. Electronically Signed: By: Kellie Simmering D.O. On: 06/27/2021 10:59    Scheduled Meds:   stroke: mapping our early stages of recovery book   Does not apply Once   atorvastatin  80 mg Oral Daily   clopidogrel  75 mg Oral Daily   feeding supplement  237 mL Oral BID BM   gabapentin  300 mg Oral TID   heparin  5,000 Units Subcutaneous Q12H   insulin aspart  0-9 Units Subcutaneous TID WC   insulin glargine-yfgn  10 Units Subcutaneous QHS   linagliptin  5 mg Oral Daily   oseltamivir  30 mg Oral BID   pantoprazole  40 mg Oral BID   valproic acid  250 mg Oral TID    LOS: 1 day   Time spent: 59min  Alara Daniel C Jeronica Stlouis, DO [facility]  If 7PM-7AM, please contact night-coverage [email or web address]  06/28/2021, 7:25 AM

## 2021-06-29 LAB — CBC
HCT: 32.1 % — ABNORMAL LOW (ref 36.0–46.0)
Hemoglobin: 10.6 g/dL — ABNORMAL LOW (ref 12.0–15.0)
MCH: 26 pg (ref 26.0–34.0)
MCHC: 33 g/dL (ref 30.0–36.0)
MCV: 78.7 fL — ABNORMAL LOW (ref 80.0–100.0)
Platelets: 179 10*3/uL (ref 150–400)
RBC: 4.08 MIL/uL (ref 3.87–5.11)
RDW: 16.6 % — ABNORMAL HIGH (ref 11.5–15.5)
WBC: 6.2 10*3/uL (ref 4.0–10.5)
nRBC: 0 % (ref 0.0–0.2)

## 2021-06-29 LAB — BASIC METABOLIC PANEL
Anion gap: 7 (ref 5–15)
BUN: 17 mg/dL (ref 8–23)
CO2: 26 mmol/L (ref 22–32)
Calcium: 8.2 mg/dL — ABNORMAL LOW (ref 8.9–10.3)
Chloride: 103 mmol/L (ref 98–111)
Creatinine, Ser: 0.92 mg/dL (ref 0.44–1.00)
GFR, Estimated: >60 mL/min (ref >60)
Glucose, Bld: 115 mg/dL — ABNORMAL HIGH (ref 70–99)
Potassium: 3.4 mmol/L — ABNORMAL LOW (ref 3.5–5.1)
Sodium: 136 mmol/L (ref 135–145)

## 2021-06-29 LAB — GLUCOSE, CAPILLARY
Glucose-Capillary: 117 mg/dL — ABNORMAL HIGH (ref 70–99)
Glucose-Capillary: 146 mg/dL — ABNORMAL HIGH (ref 70–99)
Glucose-Capillary: 110 mg/dL — ABNORMAL HIGH (ref 70–99)

## 2021-06-29 LAB — VALPROIC ACID LEVEL: Valproic Acid Lvl: 58 ug/mL (ref 50.0–100.0)

## 2021-06-29 NOTE — Plan of Care (Signed)
  Problem: Education: Goal: Knowledge of disease or condition will improve Outcome: Progressing Goal: Knowledge of secondary prevention will improve (SELECT ALL) Outcome: Progressing Goal: Individualized Educational Video(s) Outcome: Progressing

## 2021-06-29 NOTE — Evaluation (Signed)
Occupational Therapy Evaluation Patient Details Name: Crystal Moore MRN: [id number] DOB: July 27, 1946 Today's Date: 06/29/2021   History of Present Illness Pt is a 75 y.o. F who presents 06/27/2021 with sepsis secondary to flu, CVA vs seizure with Todds paralysis. MRI showing acute punctuate cerebellar infarct. report of noncompliance vs discontinuation of seizure medications.Significant PMH: chronic left foot/ankle wound, diabetes, peripheral vascular disease, COPD, former  tobacco use disorder, history of CVA, OSA, dementia, gout, essential hypertension, recent LLE arteriogram and arterial stenting.   Clinical Impression   Crystal Moore required assistance for mobility and ADLs PTA, she lives with her daughter and has an aide who comes 8 hours/day. Upon evaluation pt was limited by baseline cognition impairments, oriented to self otherwise only verbalizing "yes" and "no," and required increased time and cues for all tasks assessed. Pt was unable to tolerate LLE PROM, therefore OOB was deferred, and she required assist for sitting balance. Overall she needed max A for bed mobility, and needs up to max A +2 for ADLs. She will benefit from continued OT acutely. Recommend d/c to SNF.      Recommendations for follow up therapy are one component of a multi-disciplinary discharge planning process, led by the attending physician.  Recommendations may be updated based on patient status, additional functional criteria and insurance authorization.   Follow Up Recommendations  Skilled nursing-short term rehab (<3 hours/day)    Assistance Recommended at Discharge Frequent or constant Supervision/Assistance  Functional Status Assessment  Patient has had a recent decline in their functional status and/or demonstrates limited ability to make significant improvements in function in a reasonable and predictable amount of time  Equipment Recommendations  Other (comment) (TBD at next venue)    Recommendations for Other  Services       Precautions / Restrictions Precautions Precautions: Fall;Other (comment) Precaution Comments: L hemiplegic (chronic) Restrictions Weight Bearing Restrictions: No      Mobility Bed Mobility Overal bed mobility: Needs Assistance Bed Mobility: Rolling;Sidelying to Sit;Sit to Supine Rolling: Mod assist Sidelying to sit: Max assist   Sit to supine: Max assist   General bed mobility comments: step by step cues, max assist, and significantly increased time required    Transfers Overall transfer level: Needs assistance Equipment used: None               General transfer comment: deferred due to pt pain with extension of LLE      Balance Overall balance assessment: Needs assistance Sitting-balance support: Feet supported Sitting balance-Leahy Scale: Poor Sitting balance - Comments: required assist of therapist                                   ADL either performed or assessed with clinical judgement   ADL Overall ADL's : Needs assistance/impaired Eating/Feeding: Supervision/ safety;Sitting   Grooming: Sitting;Minimal assistance   Upper Body Bathing: Moderate assistance;Sitting   Lower Body Bathing: Maximal assistance;+2 for physical assistance;+2 for safety/equipment   Upper Body Dressing : Minimal assistance;Sitting   Lower Body Dressing: Maximal assistance;+2 for physical assistance;+2 for safety/equipment;Sit to/from stand   Toilet Transfer: Maximal assistance;+2 for physical assistance;+2 for safety/equipment;Stand-pivot;BSC/3in1   Toileting- Clothing Manipulation and Hygiene: Maximal assistance;+2 for physical assistance;+2 for safety/equipment;Sit to/from stand       Functional mobility during ADLs: Maximal assistance;+2 for physical assistance;+2 for safety/equipment General ADL Comments: assist due to baseline L sided weakness, generalized weakness, impaired cognition and poor balance  Vision Baseline Vision/History:  0 No visual deficits Ability to See in Adequate Light: 0 Adequate Patient Visual Report: No change from baseline Vision Assessment?: No apparent visual deficits     Perception     Praxis      Pertinent Vitals/Pain Pain Assessment: Faces Faces Pain Scale: Hurts even more Pain Location: LLE wtih PROM Pain Descriptors / Indicators: Discomfort;Grimacing;Guarding Pain Intervention(s): Limited activity within patient's tolerance;Monitored during session     Hand Dominance Right   Extremity/Trunk Assessment Upper Extremity Assessment Upper Extremity Assessment: LUE deficits/detail LUE Deficits / Details: flexed posture, no AROM, hand and wrist PROM is WFL. chronic LUE Sensation: decreased light touch;decreased proprioception LUE Coordination: decreased fine motor;decreased gross motor   Lower Extremity Assessment Lower Extremity Assessment: Defer to PT evaluation   Cervical / Trunk Assessment Cervical / Trunk Assessment: Normal   Communication Communication Communication: Expressive difficulties   Cognition Arousal/Alertness: Awake/alert Behavior During Therapy: Flat affect Overall Cognitive Status: History of cognitive impairments - at baseline                                 General Comments: Minimal verbalizations at baseline, able to follow 1 step commands with increased time/multimodal cueing. States name and birthday, otherwise limited "yes" and "no"     General Comments  VSS with nasal canula, no family present    Exercises     Shoulder Instructions      Home Living Family/patient expects to be discharged to:: Private residence Living Arrangements: Children Available Help at Discharge: Family;Available 24 hours/day Type of Home: House Home Access: Stairs to enter [employer] of Steps: 1   Home Layout: One level         Biochemist, clinical: [place] - single point;Wheelchair - manual   Additional  Comments: Info from 08/2020 chart      Prior Functioning/Environment Prior Level of Function : Needs assist             Mobility Comments: uses cane vs HHA, very limited distance ADLs Comments: aide 8 hours/day, washes at sink        OT Problem List: Decreased strength;Decreased range of motion;Decreased activity tolerance;Impaired balance (sitting and/or standing);Decreased knowledge of use of DME or AE;Decreased knowledge of precautions;Pain;Decreased safety awareness;Decreased coordination;Decreased cognition      OT Treatment/Interventions: Self-care/ADL training;Therapeutic exercise;Balance training;Patient/family education;Therapeutic activities;DME and/or AE instruction    OT Goals(Current goals can be found in the care plan section) Acute Rehab OT Goals Patient Stated Goal: did not state OT Goal Formulation: With patient Time For Goal Achievement: 07/13/21 Potential to Achieve Goals: Fair ADL Goals Pt Will Perform Grooming: with set-up;sitting Pt Will Perform Upper Body Dressing: with set-up;sitting Pt Will Transfer to Toilet: with min guard assist;ambulating  OT Frequency:     Barriers to D/C:            Co-evaluation              AM-PAC OT "6 Clicks" Daily Activity     Outcome Measure Help from another person eating meals?: A Little Help from another person taking care of personal grooming?: A Little Help from another person toileting, which includes using toliet, bedpan, or urinal?: A Lot Help from another person bathing (including washing, rinsing, drying)?: A Lot Help from another person to put on and taking off regular upper body clothing?: A Lot Help from another person to put  on and taking off regular lower body clothing?: A Lot 6 Click Score: 14   End of Session Equipment Utilized During Treatment: Oxygen Nurse Communication: Mobility status  Activity Tolerance: Patient limited by pain Patient left: in bed;with call bell/phone within  reach;with bed alarm set  OT Visit Diagnosis: Other abnormalities of gait and mobility (R26.89);Muscle weakness (generalized) (M62.81);Pain;Hemiplegia and hemiparesis Hemiplegia - Right/Left: Left Hemiplegia - dominant/non-dominant: Non-Dominant Hemiplegia - caused by: Cerebral infarction                Time: 8295-6213 OT Time Calculation (min): 18 min Charges:  OT General Charges $OT Visit: 1 Visit OT Evaluation $OT Eval Moderate Complexity: 1 Mod   Onis Markoff A Serafina Topham 06/29/2021, 9:19 AM

## 2021-06-29 NOTE — TOC Initial Note (Signed)
Transition of Care [facility]) - Initial/Assessment Note    Patient Details  Name: Crystal Moore MRN: [id number] Date of Birth: May 26, 1946  [facility] of [facility]) CM/SW Contact:    Joanne Chars, LCSW Phone Number: 06/29/2021, 11:00 AM  Clinical Narrative:    CSW met with pt in room, but pt unable to provide information for assessment.  Choice document left in room with pt.  Pt does give permission for CSW to speak with daughter [name].  CSW LM with daughter [name].   1045: CSW spoke with [name] regarding recommendation for SNF.  [name] declines this recommendation.  She reports pt lives at home with her and her husband, pt husband also in the home, and pt son also available to help.  Pt currently has [facility] in place for wound care.  They have done PT in the past.  They also have 7 days per week [place] aide services in place through [facility].  Daughter would like for pt to return home and have [facility] resume PT/OT services.  Choice discussed, daughter happy with [facility].  Current DME in home: walker, hospital bed, 3n1.  [name] asking about a bar for the bed to help pt reposition.  Pt is not vaccinated for covid.                 Expected Discharge Plan: [place] Barriers to Discharge: Continued Medical Work up   Patient Goals and CMS Choice   CMS Medicare.gov Compare Post Acute Care list provided to:: Patient Choice offered to / list presented to : Patient  Expected Discharge Plan and Services Expected Discharge Plan: [place] Choice: [place] arrangements for the past 2 months: Single Family Home                                      Prior Living Arrangements/Services Living arrangements for the past 2 months: Single Family Home Lives with:: Adult Children, Spouse Patient language and need for interpreter reviewed:: No        Need for Family Participation in Patient Care: Yes  (Comment) Care giver support system in place?: Yes (comment) Current home services: Homehealth aide, Home RN Criminal Activity/Legal Involvement Pertinent to Current Situation/Hospitalization: No - Comment as needed  Activities of Daily Living      Permission Sought/Granted                  Emotional Assessment Appearance:: Appears stated age Attitude/Demeanor/Rapport: Unable to Assess Affect (typically observed): Unable to Assess Orientation: : Oriented to Self Alcohol / Substance Use: Not Applicable Psych Involvement: No (comment)  Admission diagnosis:  Pain [R52] Influenza A [J10.1] Acute respiratory failure with hypoxia (HCC) [J96.01] Sepsis ([place]) [A41.9] Patient Active Problem List   Diagnosis Date Noted   Acute respiratory failure with hypoxia ([place])    Diabetic oculopathy ([place]) 11/14/2020   Hemiplegia of nondominant side as late effect of cerebrovascular disease ([place]) 11/14/2020   Hyperglycemia due to type 2 diabetes mellitus ([place]) 11/14/2020   Long term (current) use of insulin ([place]) 11/14/2020   Memory loss 11/14/2020   Nicotine dependence, unspecified, uncomplicated 82/99/3716   Non-pressure chronic ulcer of other part of left foot with unspecified severity ([place]) 11/14/2020   Residual cognitive deficit as late effect of cerebrovascular accident 11/14/2020   Pressure injury of skin  09/16/2020   Acute blood loss anemia    Gastric AVM    Acute upper GI bleed 09/14/2020   Symptomatic anemia 09/14/2020   Peripheral arterial disease with history of revascularization ([place]) 09/14/2020   Cellulitis of great toe of right foot 08/26/2020   CVA (cerebral vascular accident) ([place])    COVID-19 virus infection 07/2020   Ankle ulcer ([place]) 06/28/2020   Closed fracture of neck of left femur ([place]) 08/11/2019   COVID-19 virus detected 08/11/2019   Dementia ([place]) 08/11/2019   AKI (acute kidney injury) ([place]) 01/15/2019   Cerebral thrombosis with cerebral infarction 01/15/2019    TIA (transient ischemic attack) 01/13/2019   Type 2 diabetes mellitus with vascular disease ([place]) 01/13/2019   Hypokalemia 12/18/2018   Acute CVA (cerebrovascular accident) ([place]) 12/17/2018   Malnutrition of moderate degree 05/31/2018   Hematochezia 05/29/2018   Ischemic colitis ([place]) 05/29/2018   Acute encephalopathy 05/29/2018   Type 2 diabetes mellitus ([place]) 05/29/2018   Hypertension 05/29/2018   Depression 05/29/2018   COPD (chronic obstructive pulmonary disease) ([place]) 05/29/2018   Sepsis ([place]) 05/28/2018   Diabetes ([place]) 03/16/2018   ILD (interstitial lung disease) ([place]) 02/18/2018   Chronic respiratory failure with hypoxia ([place]) 02/18/2018   COPD (chronic obstructive pulmonary disease) ([place]) 01/12/2018   Tobacco abuse 01/12/2018   Arthritis 07/29/2017   Asthma 07/29/2017   Gastroesophageal reflux disease 07/29/2017   Hyperlipidemia 07/29/2017   Left hemiplegia ([place]) 07/29/2017   Sciatica 07/29/2017   Vitamin D deficiency 07/29/2017   PCP:  [name], MD Pharmacy:  No Pharmacies Listed    Social Determinants of Health (SDOH) Interventions    Readmission Risk Interventions Readmission Risk Prevention Plan 09/18/2020  Transportation Screening Complete  PCP or Specialist Appt within 3-5 Days Complete  Social Work Consult for [place] Planning/Counseling Complete  Palliative Care Screening Not Applicable  Medication Review Press photographer) Complete  Some recent data might be hidden

## 2021-06-29 NOTE — Progress Notes (Signed)
Brief review of HPI: 75 y.o. female with a PMHx of COPD, DM, seizures, dementia, pyelonephritis, PVD, tobacco use, prior UTI, GIB, s/p left femoral and left popliteal artery stent placement, chronic left sided weakness 2/2 CVA, wheelchair bound at baseline, who presented to the ED 12/2 as a Code Stroke for evaluation of acute mutism, left facial droop, and left sided drooling.  Subjective: Awake, lying in bed. Appears comfortable. No complaints.   Objective: Current vital signs: BP (!) 123/42 (BP Location: Right Arm)   Pulse 68   Temp 98 F (36.7 C) (Oral)   Resp 18   Ht 5\' 2"  (1.575 m)   Wt 64.5 kg   SpO2 97%   BMI 26.01 kg/m  Vital signs in last 24 hours: Temp:  [98 F (36.7 C)-99.2 F (37.3 C)] 98 F (36.7 C) (12/04 0357) Pulse Rate:  [62-72] 68 (12/04 0357) Resp:  [14-18] 18 (12/04 0357) BP: (122-131)/(42-51) 123/42 (12/04 0357) SpO2:  [94 %-99 %] 97 % (12/04 0357)  Intake/Output from previous day: No intake/output data recorded. Intake/Output this shift: No intake/output data recorded. Nutritional status:  Diet Order             Diet heart healthy/carb modified Room service appropriate? Yes with Assist; Fluid consistency: Thin  Diet effective now                  Gen: Sitting up comfortably in bed, in no acute distress HEENT: [place]/AT  Neurologic Exam:  Ment: Awake with increased latencies of verbal and motor responses. She answers in one-word responses. Poor attention noted. Patient follows simple commands with repeat instruction. Patient is partially oriented.  Patient is dysarthric in the setting of edentulous state.  Cranial Nerves: Visually fixates normally, EOMI with saccadic pursuits, chronic left-sided visual field cut, left mouth droop, hearing is intact to voice. Motor: Patient is poorly cooperative with exam today. Moves RUE and RLE normally but will not cooperate with strength testing. Plegic LUE and paretic LLE with contractures, unchanged.   Sensory: Reacts to stimulation x 4.  Gait: Unable to assess    Lab Results: Results for orders placed or performed during the hospital encounter of 06/27/21 (from the past 48 hour(s))  CBG monitoring, ED     Status: Abnormal   Collection Time: 06/27/21  9:45 AM  Result Value Ref Range   Glucose-Capillary 153 (H) 70 - 99 mg/dL    Comment: Glucose reference range applies only to samples taken after fasting for at least 8 hours.  Urine rapid drug screen (hosp performed)     Status: None   Collection Time: 06/27/21  9:48 AM  Result Value Ref Range   Opiates NONE DETECTED NONE DETECTED   Cocaine NONE DETECTED NONE DETECTED   Benzodiazepines NONE DETECTED NONE DETECTED   Amphetamines NONE DETECTED NONE DETECTED   Tetrahydrocannabinol NONE DETECTED NONE DETECTED   Barbiturates NONE DETECTED NONE DETECTED    Comment: (NOTE) DRUG SCREEN FOR MEDICAL PURPOSES ONLY.  IF CONFIRMATION IS NEEDED FOR ANY PURPOSE, NOTIFY LAB WITHIN 5 DAYS.  LOWEST DETECTABLE LIMITS FOR URINE DRUG SCREEN Drug Class                     Cutoff (ng/mL) Amphetamine and metabolites    1000 Barbiturate and metabolites    200 Benzodiazepine                 784 Tricyclics and metabolites     300 Opiates and metabolites  300 Cocaine and metabolites        300 THC                            50 Performed at [facility], [place] [address] [place], [place] [address]   I-stat chem 8, ED     Status: Abnormal   Collection Time: 06/27/21  9:51 AM  Result Value Ref Range   Sodium 140 135 - 145 mmol/L   Potassium 4.2 3.5 - 5.1 mmol/L   Chloride 102 98 - 111 mmol/L   BUN 23 8 - 23 mg/dL   Creatinine, Ser 1.10 (H) 0.44 - 1.00 mg/dL   Glucose, Bld 147 (H) 70 - 99 mg/dL    Comment: Glucose reference range applies only to samples taken after fasting for at least 8 hours.   Calcium, Ion 1.04 (L) 1.15 - 1.40 mmol/L   TCO2 27 22 - 32 mmol/L   Hemoglobin 14.6 12.0 - 15.0 g/dL   HCT 43.0 36.0 - 46.0 %  Ethanol      Status: None   Collection Time: 06/27/21 10:50 AM  Result Value Ref Range   Alcohol, Ethyl (B) <10 <10 mg/dL    Comment: (NOTE) Lowest detectable limit for serum alcohol is 10 mg/dL.  For medical purposes only. Performed at [facility], [place] [address], [place], [place] [address]   Lactic acid, plasma     Status: None   Collection Time: 06/27/21 10:50 AM  Result Value Ref Range   Lactic Acid, Venous 1.7 0.5 - 1.9 mmol/L    Comment: Performed at [facility] [address], [place], [place] [address]  APTT     Status: Abnormal   Collection Time: 06/27/21 10:50 AM  Result Value Ref Range   aPTT 23 (L) 24 - 36 seconds    Comment: Performed at [facility], [place] [address], [place], [place] [address]  Protime-INR     Status: None   Collection Time: 06/27/21 10:50 AM  Result Value Ref Range   Prothrombin Time 14.3 11.4 - 15.2 seconds   INR 1.1 0.8 - 1.2    Comment: (NOTE) INR goal varies based on device and disease states. Performed at [facility], [place] [address] [place] [address] [place], [place] [address]   CBC with Differential/Platelet     Status: Abnormal   Collection Time: 06/27/21 10:50 AM  Result Value Ref Range   WBC 8.9 4.0 - 10.5 K/uL   RBC 5.10 3.87 - 5.11 MIL/uL   Hemoglobin 13.2 12.0 - 15.0 g/dL   HCT 41.9 36.0 - 46.0 %   MCV 82.2 80.0 - 100.0 fL   MCH 25.9 (L) 26.0 - 34.0 pg   MCHC 31.5 30.0 - 36.0 g/dL   RDW 17.1 (H) 11.5 - 15.5 %   Platelets 210 150 - 400 K/uL   nRBC 0.0 0.0 - 0.2 %   Neutrophils Relative % 66 %   Neutro Abs 5.8 1.7 - 7.7 K/uL   Lymphocytes Relative 22 %   Lymphs Abs 2.0 0.7 - 4.0 K/uL   Monocytes Relative 12 %   Monocytes Absolute 1.0 0.1 - 1.0 K/uL   Eosinophils Relative 0 %   Eosinophils Absolute 0.0 0.0 - 0.5 K/uL   Basophils Relative 0 %   Basophils Absolute 0.0 0.0 - 0.1 K/uL   Immature Granulocytes 0 %   Abs Immature Granulocytes 0.03 0.00 - 0.07  K/uL    Comment: Performed at [facility], [place] [address],  [place], [place] [address]  Blood culture (routine x 2)     Status: None (Preliminary result)   Collection Time: 06/27/21 10:51 AM   Specimen: BLOOD RIGHT WRIST  Result Value Ref Range   Specimen Description BLOOD RIGHT WRIST    Special Requests      BOTTLES DRAWN AEROBIC AND ANAEROBIC Blood Culture results may not be optimal due to an inadequate volume of blood received in culture bottles   Culture      NO GROWTH < 24 HOURS Performed at [facility], [place] [address] [place], [place] [address]    Report Status PENDING   Resp Panel by RT-PCR (Flu A&B, Covid) Nasopharyngeal Swab     Status: Abnormal   Collection Time: 06/27/21 11:07 AM   Specimen: Nasopharyngeal Swab; Nasopharyngeal(NP) swabs in vial transport medium  Result Value Ref Range   SARS Coronavirus 2 by RT PCR NEGATIVE NEGATIVE    Comment: (NOTE) SARS-CoV-2 target nucleic acids are NOT DETECTED.  The SARS-CoV-2 RNA is generally detectable in upper respiratory specimens during the acute phase of infection. The lowest concentration of SARS-CoV-2 viral copies this assay can detect is 138 copies/mL. A negative result does not preclude SARS-Cov-2 infection and should not be used as the sole basis for treatment or other patient management decisions. A negative result may occur with  improper specimen collection/handling, submission of specimen other than nasopharyngeal swab, presence of viral mutation(s) within the areas targeted by this assay, and inadequate number of viral copies(<138 copies/mL). A negative result must be combined with clinical observations, patient history, and epidemiological information. The expected result is Negative.  Fact Sheet for Patients:  EntrepreneurPulse.com.au  Fact Sheet for Healthcare Providers:  IncredibleEmployment.be  This test is no t yet approved or cleared by the [place] FDA and  has been authorized for detection and/or diagnosis of  SARS-CoV-2 by FDA under an Emergency Use Authorization (EUA). This EUA will remain  in effect (meaning this test can be used) for the duration of the COVID-19 declaration under Section 564(b)(1) of the Act, 21 U.S.C.section 360bbb-3(b)(1), unless the authorization is terminated  or revoked sooner.       Influenza A by PCR POSITIVE (A) NEGATIVE   Influenza B by PCR NEGATIVE NEGATIVE    Comment: (NOTE) The Xpert Xpress SARS-CoV-2/FLU/RSV plus assay is intended as an aid in the diagnosis of influenza from Nasopharyngeal swab specimens and should not be used as a sole basis for treatment. Nasal washings and aspirates are unacceptable for Xpert Xpress SARS-CoV-2/FLU/RSV testing.  Fact Sheet for Patients: EntrepreneurPulse.com.au  Fact Sheet for Healthcare Providers: IncredibleEmployment.be  This test is not yet approved or cleared by the [place] FDA and has been authorized for detection and/or diagnosis of SARS-CoV-2 by FDA under an Emergency Use Authorization (EUA). This EUA will remain in effect (meaning this test can be used) for the duration of the COVID-19 declaration under Section 564(b)(1) of the Act, 21 U.S.C. section 360bbb-3(b)(1), unless the authorization is terminated or revoked.  Performed at [facility], [place] [address] [place],  [address]   CBG monitoring, ED     Status: Abnormal   Collection Time: 06/27/21  5:17 PM  Result Value Ref Range   Glucose-Capillary 138 (H) 70 - 99 mg/dL    Comment: Glucose reference range applies only to samples taken after fasting for at least 8 hours.  Glucose, capillary  Status: Abnormal   Collection Time: 06/27/21  9:13 PM  Result Value Ref Range   Glucose-Capillary 172 (H) 70 - 99 mg/dL    Comment: Glucose reference range applies only to samples taken after fasting for at least 8 hours.   Comment 1 Notify RN    Comment 2 Document in Chart   Comprehensive metabolic panel      Status: Abnormal   Collection Time: 06/27/21  9:16 PM  Result Value Ref Range   Sodium 135 135 - 145 mmol/L   Potassium 3.6 3.5 - 5.1 mmol/L   Chloride 103 98 - 111 mmol/L   CO2 23 22 - 32 mmol/L   Glucose, Bld 200 (H) 70 - 99 mg/dL    Comment: Glucose reference range applies only to samples taken after fasting for at least 8 hours.   BUN 17 8 - 23 mg/dL   Creatinine, Ser 0.96 0.44 - 1.00 mg/dL   Calcium 8.0 (L) 8.9 - 10.3 mg/dL   Total Protein 6.2 (L) 6.5 - 8.1 g/dL   Albumin 2.5 (L) 3.5 - 5.0 g/dL   AST 37 15 - 41 U/L   ALT 28 0 - 44 U/L   Alkaline Phosphatase 73 38 - 126 U/L   Total Bilirubin 0.6 0.3 - 1.2 mg/dL   GFR, Estimated >60 >60 mL/min    Comment: (NOTE) Calculated using the CKD-EPI Creatinine Equation (2021)    Anion gap 9 5 - 15    Comment: Performed at [facility], [place] [address], [place], [place] [address]  Hemoglobin A1c     Status: Abnormal   Collection Time: 06/27/21  9:16 PM  Result Value Ref Range   Hgb A1c MFr Bld 8.7 (H) 4.8 - 5.6 %    Comment: (NOTE) Pre diabetes:          5.7%-6.4%  Diabetes:              >6.4%  Glycemic control for   <7.0% adults with diabetes    Mean Plasma Glucose 202.99 mg/dL    Comment: Performed at [facility] [address] [place], [place] [address]  Hemoglobin A1c     Status: Abnormal   Collection Time: 06/28/21  1:07 AM  Result Value Ref Range   Hgb A1c MFr Bld 8.8 (H) 4.8 - 5.6 %    Comment: (NOTE) Pre diabetes:          5.7%-6.4%  Diabetes:              >6.4%  Glycemic control for   <7.0% adults with diabetes    Mean Plasma Glucose 205.86 mg/dL    Comment: Performed at [facility] [address] [place], [place] [address]  Lipid panel     Status: Abnormal   Collection Time: 06/28/21  1:07 AM  Result Value Ref Range   Cholesterol 84 0 - 200 mg/dL   Triglycerides 33 <150 mg/dL   HDL 31 (L) >40 mg/dL   Total CHOL/HDL Ratio 2.7 RATIO   VLDL 7 0 - 40 mg/dL   LDL Cholesterol 46 0 -  99 mg/dL    Comment:        Total Cholesterol/HDL:CHD Risk Coronary Heart Disease Risk Table                     Men   Women  1/2 Average Risk   3.4   3.3  Average Risk  5.0   4.4  2 X Average Risk   9.6   7.1  3 X Average Risk  23.4   11.0        Use the calculated Patient Ratio above and the CHD Risk Table to determine the patient's CHD Risk.        ATP III CLASSIFICATION (LDL):  <100     mg/dL   Optimal  100-129  mg/dL   Near or Above                    Optimal  130-159  mg/dL   Borderline  160-189  mg/dL   High  >190     mg/dL   Very High Performed at [facility] [address], [place], [place] [address]   Glucose, capillary     Status: Abnormal   Collection Time: 06/28/21  6:03 AM  Result Value Ref Range   Glucose-Capillary 110 (H) 70 - 99 mg/dL    Comment: Glucose reference range applies only to samples taken after fasting for at least 8 hours.   Comment 1 Notify RN    Comment 2 Document in Chart   Glucose, capillary     Status: Abnormal   Collection Time: 06/28/21 11:17 AM  Result Value Ref Range   Glucose-Capillary 170 (H) 70 - 99 mg/dL    Comment: Glucose reference range applies only to samples taken after fasting for at least 8 hours.  Glucose, capillary     Status: None   Collection Time: 06/28/21  5:25 PM  Result Value Ref Range   Glucose-Capillary 84 70 - 99 mg/dL    Comment: Glucose reference range applies only to samples taken after fasting for at least 8 hours.  Glucose, capillary     Status: Abnormal   Collection Time: 06/28/21  9:01 PM  Result Value Ref Range   Glucose-Capillary 126 (H) 70 - 99 mg/dL    Comment: Glucose reference range applies only to samples taken after fasting for at least 8 hours.  Valproic acid level     Status: None   Collection Time: 06/29/21  4:53 AM  Result Value Ref Range   Valproic Acid Lvl 58 50.0 - 100.0 ug/mL    Comment: Performed at [facility] [address] [place], [place] [address]  CBC      Status: Abnormal   Collection Time: 06/29/21  4:53 AM  Result Value Ref Range   WBC 6.2 4.0 - 10.5 K/uL   RBC 4.08 3.87 - 5.11 MIL/uL   Hemoglobin 10.6 (L) 12.0 - 15.0 g/dL   HCT 32.1 (L) 36.0 - 46.0 %   MCV 78.7 (L) 80.0 - 100.0 fL   MCH 26.0 26.0 - 34.0 pg   MCHC 33.0 30.0 - 36.0 g/dL   RDW 16.6 (H) 11.5 - 15.5 %   Platelets 179 150 - 400 K/uL   nRBC 0.0 0.0 - 0.2 %    Comment: Performed at [facility], [place] [address] [place], [place] [address]  Basic metabolic panel     Status: Abnormal   Collection Time: 06/29/21  4:53 AM  Result Value Ref Range   Sodium 136 135 - 145 mmol/L   Potassium 3.4 (L) 3.5 - 5.1 mmol/L   Chloride 103 98 - 111 mmol/L   CO2 26 22 - 32 mmol/L   Glucose, Bld 115 (H) 70 - 99 mg/dL    Comment: Glucose reference range applies only to  samples taken after fasting for at least 8 hours.   BUN 17 8 - 23 mg/dL   Creatinine, Ser 0.92 0.44 - 1.00 mg/dL   Calcium 8.2 (L) 8.9 - 10.3 mg/dL   GFR, Estimated >60 >60 mL/min    Comment: (NOTE) Calculated using the CKD-EPI Creatinine Equation (2021)    Anion gap 7 5 - 15    Comment: Performed at [facility] [address] [place], [place] [address]  Glucose, capillary     Status: Abnormal   Collection Time: 06/29/21  6:02 AM  Result Value Ref Range   Glucose-Capillary 117 (H) 70 - 99 mg/dL    Comment: Glucose reference range applies only to samples taken after fasting for at least 8 hours.    Recent Results (from the past 240 hour(s))  Blood culture (routine x 2)     Status: None (Preliminary result)   Collection Time: 06/27/21 10:51 AM   Specimen: BLOOD RIGHT WRIST  Result Value Ref Range Status   Specimen Description BLOOD RIGHT WRIST  Final   Special Requests   Final    BOTTLES DRAWN AEROBIC AND ANAEROBIC Blood Culture results may not be optimal due to an inadequate volume of blood received in culture bottles   Culture   Final    NO GROWTH < 24 HOURS Performed at [facility],  [place] [address] [place], [place] [address]    Report Status PENDING  Incomplete  Resp Panel by RT-PCR (Flu A&B, Covid) Nasopharyngeal Swab     Status: Abnormal   Collection Time: 06/27/21 11:07 AM   Specimen: Nasopharyngeal Swab; Nasopharyngeal(NP) swabs in vial transport medium  Result Value Ref Range Status   SARS Coronavirus 2 by RT PCR NEGATIVE NEGATIVE Final    Comment: (NOTE) SARS-CoV-2 target nucleic acids are NOT DETECTED.  The SARS-CoV-2 RNA is generally detectable in upper respiratory specimens during the acute phase of infection. The lowest concentration of SARS-CoV-2 viral copies this assay can detect is 138 copies/mL. A negative result does not preclude SARS-Cov-2 infection and should not be used as the sole basis for treatment or other patient management decisions. A negative result may occur with  improper specimen collection/handling, submission of specimen other than nasopharyngeal swab, presence of viral mutation(s) within the areas targeted by this assay, and inadequate number of viral copies(<138 copies/mL). A negative result must be combined with clinical observations, patient history, and epidemiological information. The expected result is Negative.  Fact Sheet for Patients:  EntrepreneurPulse.com.au  Fact Sheet for Healthcare Providers:  IncredibleEmployment.be  This test is no t yet approved or cleared by the [place] FDA and  has been authorized for detection and/or diagnosis of SARS-CoV-2 by FDA under an Emergency Use Authorization (EUA). This EUA will remain  in effect (meaning this test can be used) for the duration of the COVID-19 declaration under Section 564(b)(1) of the Act, 21 U.S.C.section 360bbb-3(b)(1), unless the authorization is terminated  or revoked sooner.       Influenza A by PCR POSITIVE (A) NEGATIVE Final   Influenza B by PCR NEGATIVE NEGATIVE Final    Comment: (NOTE) The Xpert Xpress  SARS-CoV-2/FLU/RSV plus assay is intended as an aid in the diagnosis of influenza from Nasopharyngeal swab specimens and should not be used as a sole basis for treatment. Nasal washings and aspirates are unacceptable for Xpert Xpress SARS-CoV-2/FLU/RSV testing.  Fact Sheet for Patients: EntrepreneurPulse.com.au  Fact Sheet for Healthcare Providers: IncredibleEmployment.be  This test is not yet  approved or cleared by the [place] and has been authorized for detection and/or diagnosis of SARS-CoV-2 by FDA under an Emergency Use Authorization (EUA). This EUA will remain in effect (meaning this test can be used) for the duration of the COVID-19 declaration under Section 564(b)(1) of the Act, 21 U.S.C. section 360bbb-3(b)(1), unless the authorization is terminated or revoked.  Performed at [facility], [place] [address] [place], [place] [address]     Lipid Panel Recent Labs    06/28/21 0107  CHOL 84  TRIG 33  HDL 31*  CHOLHDL 2.7  VLDL 7  LDLCALC 46    Studies/Results: MR BRAIN WO CONTRAST  Result Date: 06/27/2021 CLINICAL DATA:  Slurred speech, left facial droop EXAM: MRI HEAD WITHOUT CONTRAST TECHNIQUE: Multiplanar, multiecho pulse sequences of the brain and surrounding structures were obtained without intravenous contrast. COMPARISON:  Same-day CT/CTA head and neck FINDINGS: Image quality is intermittently degraded by motion artifact. Specifically, the axial T1 images are moderately motion degraded. Brain: There is a punctate focus of diffusion restriction in the left cerebellar hemisphere consistent with acute infarct. There is no other evidence of acute infarct. There is no acute intracranial hemorrhage or extra-axial fluid collection. There is global parenchymal volume loss with marked enlargement of the ventricular system. There is confluent FLAIR signal abnormality in the subcortical and periventricular white matter likely  reflecting sequela of chronic white matter microangiopathy. There is a remote infarct in the right ACA distribution, unchanged. There are small remote infarcts in the bilateral cerebellar hemispheres and right cerebral peduncle. There is no solid mass lesion.  There is no midline shift. Vascular: Normal flow voids. Skull and upper cervical spine: Normal marrow signal. Sinuses/Orbits: The paranasal sinuses are clear. The globes and orbits are unremarkable. Other: None. IMPRESSION: 1. Punctate acute infarct in the left cerebellar hemisphere. 2. Global parenchymal volume loss with right worse than left hippocampal atrophy and chronic white matter microangiopathy. 3. Remote infarcts as above. Electronically Signed   By: Valetta Mole M.D.   On: 06/27/2021 16:55   DG Chest Port 1 View  Result Date: 06/27/2021 CLINICAL DATA:  Fever.  Stroke evaluation. EXAM: PORTABLE CHEST 1 VIEW COMPARISON:  Radiographs 09/14/2020 and 08/26/2020.  CT 02/15/2018. FINDINGS: 1113 hours. Patient is rotated to the left, and the extreme right lung apex is excluded. The heart size and mediastinal contours are stable with aortic atherosclerosis. Interstitial prominence has increased, suspicious for increased pulmonary edema. There is no confluent airspace opacity, significant pleural effusion or pneumothorax. The bones appear unchanged. Multiple telemetry leads overlie the chest. IMPRESSION: Interval diffusely increased interstitial prominence suspicious for interstitial pulmonary edema. Electronically Signed   By: Richardean Sale M.D.   On: 06/27/2021 11:34   DG Foot 2 Views Left  Result Date: 06/27/2021 CLINICAL DATA:  Generic left foot pain EXAM: LEFT FOOT - 2 VIEW COMPARISON:  None. FINDINGS: No acute fracture or dislocation. No aggressive osseous lesion. Normal alignment. Generalized osteopenia. Mild osteoarthritis of the first MTP joint and first IP joint. Small plantar calcaneal spur. Enthesopathic changes at the Achilles tendon  insertion. In Soft tissue are unremarkable. No radiopaque foreign body or soft tissue emphysema. IMPRESSION: No acute osseous injury of the left foot. Electronically Signed   By: Kathreen Devoid M.D.   On: 06/27/2021 14:09   EEG adult  Result Date: 06/27/2021 Lora Havens, MD     06/27/2021  1:02 PM Patient Name: ALBERTHA BEATTIE MRN: [id number] Epilepsy Attending: Lora Havens  Referring Physician/Provider: Dr Kerney Elbe Date: 06/27/2021 Duration: 36.34 mins Patient history: 75 year old female with chronic left sided weakness secondary to prior stroke, presenting as a Code Stroke with acute onset of mutism, left facial droop and left sided drooling.  EEG to evaluate for seizure. Level of alertness: Awake AEDs during EEG study: Technical aspects: This EEG study was done with scalp electrodes positioned according to the 10-20 International system of electrode placement. Electrical activity was acquired at a sampling rate of 500Hz  and reviewed with a high frequency filter of 70Hz  and a low frequency filter of 1Hz . EEG data were recorded continuously and digitally stored. Description: The posterior dominant rhythm consists of 8 Hz activity of moderate voltage (25-35 uV) seen predominantly in posterior head regions, symmetric and reactive to eye opening and eye closing. Drowsiness was characterized by attenuation of the posterior background rhythm. EEG showed intermittent generalized and maximal right frontal region 3 to 6 Hz theta-delta slowing.  Spikes were also noted in the right hemisphere, maximal right frontal region which at times appeared quasiperiodic at 1 Hz. Hyperventilation and photic stimulation were not performed.   ABNORMALITY - Spike, right hemisphere, maximal right frontal region - Intermittent slow, generalized and maximal right frontal region IMPRESSION: This study showed evidence of epileptogenicity and cortical dysfunction arising from right hemisphere, maximal right frontal region likely  secondary to underlying structural abnormality and high risk for seizure recurrence . Additionally EEG suggestive of mild diffuse encephalopathy, nonspecific etiology.  No seizures were seen throughout the recording. Dr Cheral Marker was notified Lora Havens   ECHOCARDIOGRAM COMPLETE  Result Date: 06/28/2021    ECHOCARDIOGRAM REPORT   Patient Name:   SHANIECE BUSSA [facility] Date of Exam: 06/28/2021 Medical Rec #:  [id number]       Height:       62.0 in Accession #:    [id number]      Weight:       142.2 lb Date of Birth:  [birth year]        BSA:          1.654 m Patient Age:    66 years        BP:           162/72 mmHg Patient Gender: F               HR:           72 bpm. Exam Location:  Inpatient Procedure: 2D Echo, Cardiac Doppler and Color Doppler Indications:    TIA  History:        Patient has prior history of Echocardiogram examinations, most                 recent 12/18/2018. COPD and Stroke, Signs/Symptoms:Altered Mental                 Status; Risk Factors:Diabetes, Dyslipidemia and Hypertension.                 Tobacco abuse.  Sonographer:    Wenda Low Referring Phys: [id number] [place]  1. Left ventricular ejection fraction, by estimation, is 60 to 65%. The left ventricle has normal function. The left ventricle has no regional wall motion abnormalities. Left ventricular diastolic parameters were normal.  2. Right ventricular systolic function is normal. The right ventricular size is normal.  3. The mitral valve is normal in structure. Trivial mitral valve regurgitation. No evidence of mitral stenosis.  4. The aortic valve is normal in  structure. Aortic valve regurgitation is not visualized. No aortic stenosis is present. FINDINGS  Left Ventricle: Left ventricular ejection fraction, by estimation, is 60 to 65%. The left ventricle has normal function. The left ventricle has no regional wall motion abnormalities. The left ventricular internal cavity size was normal in size. There is  no left  ventricular hypertrophy. Left ventricular diastolic parameters were normal. Right Ventricle: The right ventricular size is normal. No increase in right ventricular wall thickness. Right ventricular systolic function is normal. Left Atrium: Left atrial size was normal in size. Right Atrium: Right atrial size was normal in size. Pericardium: There is no evidence of pericardial effusion. Mitral Valve: The mitral valve is normal in structure. Trivial mitral valve regurgitation. No evidence of mitral valve stenosis. MV peak gradient, 3.2 mmHg. The mean mitral valve gradient is 1.0 mmHg. Tricuspid Valve: The tricuspid valve is normal in structure. Tricuspid valve regurgitation is trivial. Aortic Valve: The aortic valve is normal in structure. Aortic valve regurgitation is not visualized. No aortic stenosis is present. Aortic valve mean gradient measures 6.0 mmHg. Aortic valve peak gradient measures 10.1 mmHg. Aortic valve area, by VTI measures 1.97 cm. Pulmonic Valve: The pulmonic valve was normal in structure. Pulmonic valve regurgitation is not visualized. Aorta: The aortic root and ascending aorta are structurally normal, with no evidence of dilitation. IAS/Shunts: The atrial septum is grossly normal.  LEFT VENTRICLE PLAX 2D LVIDd:         4.30 cm     Diastology LVIDs:         2.60 cm     LV e' medial:    5.70 cm/s LV PW:         1.10 cm     LV E/e' medial:  10.8 LV IVS:        1.10 cm     LV e' lateral:   6.45 cm/s LVOT diam:     2.00 cm     LV E/e' lateral: 9.6 LV SV:         75 LV SV Index:   46 LVOT Area:     3.14 cm  LV Volumes (MOD) LV vol d, MOD A2C: 47.2 ml LV vol d, MOD A4C: 51.8 ml LV vol s, MOD A2C: 30.1 ml LV vol s, MOD A4C: 20.6 ml LV SV MOD A2C:     17.1 ml LV SV MOD A4C:     51.8 ml LV SV MOD BP:      27.6 ml RIGHT VENTRICLE RV Basal diam:  2.85 cm RV Mid diam:    2.60 cm RV S prime:     13.10 cm/s TAPSE (M-mode): 1.9 cm LEFT ATRIUM             Index        RIGHT ATRIUM           Index LA diam:         3.50 cm 2.12 cm/m   RA Area:     11.10 cm LA Vol (A2C):   36.3 ml 21.95 ml/m  RA Volume:   23.30 ml  14.09 ml/m LA Vol (A4C):   28.3 ml 17.11 ml/m LA Biplane Vol: 34.4 ml 20.80 ml/m  AORTIC VALVE                     PULMONIC VALVE AV Area (Vmax):    2.05 cm      PV Vmax:       0.79 m/s AV  Area (Vmean):   1.85 cm      PV Peak grad:  2.5 mmHg AV Area (VTI):     1.97 cm AV Vmax:           159.00 cm/s AV Vmean:          114.000 cm/s AV VTI:            0.382 m AV Peak Grad:      10.1 mmHg AV Mean Grad:      6.0 mmHg LVOT Vmax:         104.00 cm/s LVOT Vmean:        67.000 cm/s LVOT VTI:          0.240 m LVOT/AV VTI ratio: 0.63  AORTA Ao Root diam: 2.50 cm MITRAL VALVE MV Area (PHT): 3.08 cm    SHUNTS MV Area VTI:   2.67 cm    Systemic VTI:  0.24 m MV Peak grad:  3.2 mmHg    Systemic Diam: 2.00 cm MV Mean grad:  1.0 mmHg MV Vmax:       0.89 m/s MV Vmean:      45.8 cm/s MV Decel Time: 246 msec MV E velocity: 61.80 cm/s MV A velocity: 93.30 cm/s MV E/A ratio:  0.66 Mertie Moores MD Electronically signed by Mertie Moores MD Signature Date/Time: 06/28/2021/2:20:14 PM    Final    CT HEAD CODE STROKE WO CONTRAST  Result Date: 06/27/2021 CLINICAL DATA:  Code stroke. Neuro deficit, acute, stroke suspected. Additional history provided: Left-sided deficit, slurred speech, nonambulatory. EXAM: CT HEAD WITHOUT CONTRAST TECHNIQUE: Contiguous axial images were obtained from the base of the skull through the vertex without intravenous contrast. COMPARISON:  Head CT 01/30/2021 and earlier. Brain MRI 01/03/2019. MRA head 01/14/2019. FINDINGS: Brain: Mild generalized cerebral and cerebellar atrophy. Redemonstrated chronic right ACA territory cortical/subcortical infarct within the medial right frontal lobe. Redemonstrated chronic infarcts within the bilateral cerebral hemispheric white matter, deep gray nuclei, posterior limb of right internal capsule and right midbrain. Background advanced patchy and confluent  hypoattenuation within the cerebral white matter, nonspecific but compatible with chronic small vessel ischemic disease. Redemonstrated small chronic infarcts within the bilateral cerebellar hemispheres. There is no acute intracranial hemorrhage. No acute demarcated cortical infarct is identified. No extra-axial fluid collection. No evidence of an intracranial mass. No midline shift. Vascular: No hyperdense vessel.  Atherosclerotic calcifications. Skull: Normal. Negative for fracture or focal lesion. Sinuses/Orbits: Visualized orbits show no acute finding. mild mucosal thickening within the bilateral ethmoid and left maxillary sinuses at the imaged levels. ASPECTS [facility] Stroke Program Early CT Score) - Ganglionic level infarction (caudate, lentiform nuclei, internal capsule, insula, M1-M3 cortex): 7 - Supraganglionic infarction (M4-M6 cortex): 3 Total score (0-10 with 10 being normal): 10 These results were communicated to Dr. Cheral Marker at 10:14amon 12/2/2022by text page via the [facility] messaging system. IMPRESSION: Motion degraded exam. No acute intracranial abnormality is identified.  ASPECTS is 10. Redemonstrated parenchymal atrophy, chronic ischemic changes and chronic infarcts, as detailed. Electronically Signed   By: Kellie Simmering D.O.   On: 06/27/2021 10:17   CT ANGIO HEAD NECK W WO CM (CODE STROKE)  Addendum Date: 06/27/2021   ADDENDUM REPORT: 06/27/2021 19:29 ADDENDUM: CTA neck impression #4 will be called to the hospitalist provider by the Radiologist Assistant, and communication documented in the PACS or [employer]. Electronically Signed   By: Kellie Simmering D.O.   On: 06/27/2021 19:29   Result Date: 06/27/2021 CLINICAL DATA:  Neuro deficit, acute,  stroke suspected. Aside deficit, slurred speech, nonambulatory. EXAM: CT ANGIOGRAPHY HEAD AND NECK TECHNIQUE: Multidetector CT imaging of the head and neck was performed using the standard protocol during bolus administration of intravenous contrast.  Multiplanar CT image reconstructions and MIPs were obtained to evaluate the vascular anatomy. Carotid stenosis measurements (when applicable) are obtained utilizing NASCET criteria, using the distal internal carotid diameter as the denominator. CONTRAST:  107mL OMNIPAQUE IOHEXOL 350 MG/ML SOLN COMPARISON:  Noncontrast head CT performed earlier today 06/27/2021. MRA head 01/14/2019. CT angiogram head/neck 12/18/2018. Chest CT 02/15/2018 FINDINGS: CTA NECK FINDINGS Aortic arch: Common origin of the innominate and left common carotid arteries. Atherosclerotic plaque within the visualized aortic arch and proximal major branch vessels of the neck. Streak and beam hardening artifact arising from a dense right-sided contrast bolus partially obscures the right subclavian artery. Within this is a shin, no hemodynamically significant stenosis is identified within the innominate or proximal right subclavian artery. Atherosclerotic plaque within the left subclavian artery results in up to 50% stenosis, unchanged. Right carotid system: CCA and ICA patent within the neck. Prominent soft and calcified plaque within the carotid bifurcation and proximal ICA. Resultant severe stenosis at the origin of the right ICA estimated at 80%, slightly progressed as compared to the prior examination of 12/18/2018. Distal to this, the ICA is patent within the neck without stenosis. Left carotid system: CCA and ICA patent within the neck. Scattered soft and calcified plaque within the left carotid system. Most notably, there is prominent soft calcified plaque within the carotid bifurcation and proximal ICA. Estimated stenosis at the origin of the left ICA of 90%, progressed. Distal to this, the ICA is patent within the neck without stenosis. Vertebral arteries: The dominant right vertebral artery is patent within the neck. Unchanged mild atherosclerotic narrowing at the origin of this vessel. As before, the non dominant left vertebral artery is  occluded within the neck to the C5 level. Distal to this, there is irregular and faint opacification of the left vertebral artery, likely due to retrograde flow. Skeleton: Cervical spondylosis. No acute bony abnormality or aggressive osseous lesion. Other neck: Subcentimeter nodules within the thyroid gland, not meeting consensus criteria for ultrasound follow-up based on size. No neck mass or cervical lymphadenopathy. Upper chest: Redemonstrated findings of fibrotic lung disease. New from prior examinations, there is an irregular opacity within the right upper lobe measuring 2.9 cm. Also new from prior exams, and irregular opacity is present within the left upper lobe measuring up to 4.5 cm in greatest dimension. These findings may be infectious/inflammatory, but are suspicious for possible neoplasm. Review of the MIP images confirms the above findings CTA HEAD FINDINGS Anterior circulation: The intracranial internal carotid arteries are patent. Calcified plaque within both vessels. Up to moderate stenosis within the cavernous right ICA. Moderate/severe stenosis within the distal petrous/proximal cavernous left ICA, progressed. The M1 middle cerebral arteries are patent. Atherosclerotic irregularity of the M2 and more distal middle cerebral artery vessels, bilaterally, most notably as follows. Redemonstrated severe stenosis within a mid to distal M2 right MCA vessel (series 10, image 17). Redemonstrated moderate stenosis within a superior division proximal M2 left MCA vessel. Sites of progressive severe stenosis within a distal M2 left MCA vessel. The anterior cerebral arteries are patent. Redemonstrated mild to moderate stenosis within the right A1 segment. No intracranial aneurysm is identified. Posterior circulation: The dominant intracranial right vertebral artery is patent. Mild nonstenotic calcified plaque within this vessel. The intracranial left vertebral artery is developmentally diminutive  with  superimposed foci of high-grade stenosis. The basilar artery is patent. The posterior cerebral arteries are patent. Atherosclerotic irregularity of both vessels. Redemonstrated moderate stenosis within the P2 right PCA. Progressive severe stenosis within the right PCA P3 segment. Redemonstrated moderate stenosis within the P2 left PCA. Posterior communicating arteries are diminutive or absent bilaterally. Venous sinuses: Within the limitations of contrast timing, no convincing thrombus. Anatomic variants: As described. Review of the MIP images confirms the above findings No emergent large vessel occlusion. These results were communicated to Dr. Cheral Marker At 10:59 amon 12/2/2022by text page via the [facility] messaging system. IMPRESSION: CTA neck: 1. The common carotid and internal carotid arteries are patent within the neck. Atherosclerotic stenosis at the origin of the right ICA estimated at 80%, progressed from the prior examination of 12/17/2020. Atherosclerotic stenosis at the origin of the left ICA estimated at 90%, also progressed. 2. The dominant right vertebral artery is patent within the neck with unchanged mild atherosclerotic narrowing at the origin of this vessel. 3. 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 Distal to this, there is irregular and faint opacification of the left vertebral artery, likely due to retrograde flow. 4. Irregular opacities within the bilateral upper lobes measuring up to 4.5 cm, new from prior examinations. While these findings may be infectious/inflammatory in etiology, they are suspicious for possible pulmonary neoplasm. A dedicated contrast-enhanced chest CT is recommended for initial further evaluation. CTA head: 1. No intracranial large vessel occlusion is identified. 2. Intracranial atherosclerotic disease with multifocal stenoses, most notably as follows. 3. Redemonstrated sites of up to moderate stenosis within the cavernous right ICA. 4.  Moderate/severe stenosis within the distal petrous/proximal cavernous left ICA, progressed. 5. Redemonstrated severe stenosis within a mid to distal right M2 MCA vessel. 6. Redemonstrated moderate stenosis within a superior division proximal M2 left MCA vessel. 7. Sites of progressive severe stenosis within a distal M2 left MCA vessel. 8. Redemonstrated sites of severe stenosis within the V4 left vertebral artery. 9. Redemonstrated moderate stenosis within the P2 right PCA. 10. Progressive severe stenosis within the P3 right PCA. 11. Redemonstrated moderate stenosis within the P2 left PCA. Electronically Signed: By: Kellie Simmering D.O. On: 06/27/2021 10:59    Medications: Scheduled:  amLODipine  5 mg Oral Daily   atorvastatin  80 mg Oral Daily   clopidogrel  75 mg Oral Daily   feeding supplement  237 mL Oral BID BM   gabapentin  300 mg Oral TID   heparin  5,000 Units Subcutaneous Q12H   insulin aspart  0-9 Units Subcutaneous TID WC   insulin glargine-yfgn  10 Units Subcutaneous QHS   linagliptin  5 mg Oral Daily   oseltamivir  30 mg Oral BID   pantoprazole  40 mg Oral BID   valproic acid  250 mg Oral TID   TTE: 1. Left ventricular ejection fraction, by estimation, is 60 to 65%. The  left ventricle has normal function. The left ventricle has no regional  wall motion abnormalities. Left ventricular diastolic parameters were  normal.   2. Right ventricular systolic function is normal. The right ventricular  size is normal.   3. The mitral valve is normal in structure. Trivial mitral valve  regurgitation. No evidence of mitral stenosis.   4. The aortic valve is normal in structure. Aortic valve regurgitation is  not visualized. No aortic stenosis is present.  Assessment: 75 year old female with chronic left sided weakness 2/2 prior stroke, presenting  as a Code Stroke with acute onset of mutism, left facial droop and left sided drooling. The patient is wheelchair-bound at baseline.  1. Exam  reveals findings referable to a chronic right cerebral hemisphere lesion in conjunction with diffuse bihemispheric dysfunction suggestive of a toxic/metabolic/infectious etiology for her acute decompensation.  2. Daughter states patient has had 7 strokes in the past. Her second stroke was the one leaving the patient with left hemiplegia.  3. CT head, CTA head and neck as above 4. EEG (12/2) official report conclusions: - Spike, right hemisphere, maximal right frontal region - Intermittent slow, generalized and maximal right frontal region. Impression - Evidence of epileptogenicity and cortical dysfunction arising from right hemisphere, maximal right frontal region likely secondary to underlying structural abnormality and high risk for seizure recurrence . Additionally EEG suggestive of mild diffuse encephalopathy, nonspecific etiology.  No seizures were seen throughout the recording. 5. MRI brain as above with punctate acute infarct in the left cerebellar hemisphere, felt to be an incidental finding. 6. DDx for presentation: High on the differential is a partial complex seizure with postictal state in the setting of acute infection as the patient tested positive for influenza A by PCR. Daughter states she has had similar spells in the past that were stereotyped, diagnosed as seizures and treated with an anticonvulsant. The anticonvulsant was stopped about 3 years ago. Daughter is unable to recall the name of the medication. Based on the EEG findings, the most likely etiology for her presentation is recurrence of the partial complex seizure activity that daughter states she has had in the past. On interview, the daughter also feels that the event occurring at home today was most likely a seizure. Patient was loaded with 750 mg valproic acid then started on a low-dose oral valproic acid maintenance dose at 250 mg PO TID. 7. VPA level this AM: Therapeutic at 58.  8. TTE report with no mention of mural thrombus  or valvular vegetation   Recommendations: - Continue VPA 250 mg PO TID - VPA level Monday AM. If therapeutic, no longer need to obtain levels this admission.  - Continue seizure precautions - Evaluation and management of infection per primary team - Continue home clopidogrel and atorvastatin due to GIB on ASA + clopidogrel - Will need to follow up with outpatient Neurology for further AED management - Neurohospitalist service will sign off. Please call if there are additional questions.      LOS: 2 days   @Electronically  signed: Dr. Kerney Elbe 06/29/2021  7:27 AM

## 2021-06-29 NOTE — NC FL2 (Signed)
[place] LEVEL OF CARE SCREENING TOOL     IDENTIFICATION  Patient Name: Crystal Moore Birthdate: Feb 04, 1946 Sex: female Admission Date (Current Location): 06/27/2021  [place] and [place] Number:  Kathleen Argue [id number] [place] and Address:  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 ([place]) 11/14/2020   Hyperglycemia due to type 2 diabetes mellitus ([place]) 11/14/2020   Long term (current) use of insulin ([place]) 11/14/2020   Memory loss 11/14/2020   Nicotine dependence, unspecified, uncomplicated 94/50/3888   Non-pressure chronic ulcer of other part of left foot with unspecified severity ([place]) 11/14/2020   Residual cognitive deficit as late effect of cerebrovascular accident 11/14/2020   Pressure injury of skin 09/16/2020   Acute blood loss anemia    Gastric AVM    Acute upper GI bleed 09/14/2020   Symptomatic anemia 09/14/2020   Peripheral arterial disease with history of revascularization ([place]) 09/14/2020   Cellulitis of great toe of right foot 08/26/2020   CVA (cerebral vascular accident) ([place])    COVID-19 virus infection 07/2020   Ankle ulcer ([place]) 06/28/2020   Closed fracture of neck of left femur ([place]) 08/11/2019   COVID-19 virus detected 08/11/2019   Dementia ([place]) 08/11/2019   AKI (acute kidney injury) ([place]) 01/15/2019   Cerebral  thrombosis with cerebral infarction 01/15/2019   TIA (transient ischemic attack) 01/13/2019   Type 2 diabetes mellitus with vascular disease ([place]) 01/13/2019   Hypokalemia 12/18/2018   Acute CVA (cerebrovascular accident) ([place]) 12/17/2018   Malnutrition of moderate degree 05/31/2018   Hematochezia 05/29/2018   Ischemic colitis ([place]) 05/29/2018   Acute encephalopathy 05/29/2018   Type 2 diabetes mellitus ([place]) 05/29/2018   Hypertension 05/29/2018   Depression 05/29/2018   COPD (chronic obstructive pulmonary disease) ([place]) 05/29/2018   Sepsis ([place]) 05/28/2018   Diabetes ([place]) 03/16/2018   ILD (interstitial lung disease) ([place]) 02/18/2018   Chronic respiratory failure with hypoxia ([place]) 02/18/2018   COPD (chronic obstructive pulmonary disease) ([place]) 01/12/2018   Tobacco abuse 01/12/2018   Arthritis 07/29/2017   Asthma 07/29/2017   Gastroesophageal reflux disease 07/29/2017   Hyperlipidemia 07/29/2017   Left hemiplegia ([place]) 07/29/2017   Sciatica 07/29/2017   Vitamin D deficiency 07/29/2017    Orientation RESPIRATION BLADDER Height & Weight     Self  O2 Incontinent Weight: 142 lb 3.2 oz (64.5 kg) Height:  5\' 2"  (157.5 cm)  BEHAVIORAL SYMPTOMS/MOOD NEUROLOGICAL BOWEL NUTRITION STATUS      Incontinent Diet (see discharge summary)  AMBULATORY STATUS COMMUNICATION OF NEEDS Skin   Total Care Verbally Skin abrasions                       Personal Care Assistance Level of Assistance  Bathing, Feeding, Dressing Bathing Assistance: Maximum assistance Feeding assistance: Limited assistance Dressing Assistance: Maximum assistance     Functional Limitations Info  Sight,  Hearing, Speech Sight Info: Adequate Hearing Info: Adequate Speech Info: Adequate    SPECIAL CARE FACTORS FREQUENCY  PT (By licensed PT), OT (By licensed OT)     PT Frequency: 5x week OT Frequency: 5x week            Contractures Contractures Info: Not present    Additional Factors Info  Code  Status, Allergies, Insulin Sliding Scale Code Status Info: full Allergies Info: aspirin   Insulin Sliding Scale Info: Novolog, 0-9 units 3x day with meals.  See discharge summary.       Current Medications (06/29/2021):  This is the current hospital active medication list Current Facility-Administered Medications  Medication Dose Route Frequency Provider Last Rate Last Admin   acetaminophen (TYLENOL) tablet 650 mg  650 mg Oral Q4H PRN Lequita Halt, MD       Or   acetaminophen (TYLENOL) 160 MG/5ML solution 650 mg  650 mg Per Tube Q4H PRN Wynetta Fines T, MD       Or   acetaminophen (TYLENOL) suppository 650 mg  650 mg Rectal Q4H PRN Wynetta Fines T, MD       albuterol (PROVENTIL) (2.5 MG/3ML) 0.083% nebulizer solution 2.5 mg  2.5 mg Inhalation Q4H PRN Wynetta Fines T, MD       amLODipine (NORVASC) tablet 5 mg  5 mg Oral Daily Little Ishikawa, MD   5 mg at 06/28/21 1753   atorvastatin (LIPITOR) tablet 80 mg  80 mg Oral Daily Wynetta Fines T, MD   80 mg at 06/28/21 1035   clopidogrel (PLAVIX) tablet 75 mg  75 mg Oral Daily Wynetta Fines T, MD   75 mg at 06/28/21 1035   feeding supplement (ENSURE ENLIVE / ENSURE PLUS) liquid 237 mL  237 mL Oral BID BM Wynetta Fines T, MD   237 mL at 06/28/21 1035   gabapentin (NEURONTIN) capsule 300 mg  300 mg Oral TID Wynetta Fines T, MD   300 mg at 06/28/21 2155   heparin injection 5,000 Units  5,000 Units Subcutaneous Q12H Wynetta Fines T, MD   5,000 Units at 06/28/21 2155   insulin aspart (novoLOG) injection 0-9 Units  0-9 Units Subcutaneous TID WC Wynetta Fines T, MD   2 Units at 06/28/21 1154   insulin glargine-yfgn (SEMGLEE) injection 10 Units  10 Units Subcutaneous QHS Wynetta Fines T, MD   10 Units at 06/28/21 2155   linagliptin (TRADJENTA) tablet 5 mg  5 mg Oral Daily Wynetta Fines T, MD   5 mg at 06/28/21 1034   oseltamivir (TAMIFLU) capsule 30 mg  30 mg Oral BID Wynetta Fines T, MD   30 mg at 06/28/21 2156   pantoprazole (PROTONIX) EC tablet 40 mg  40 mg Oral BID  Wynetta Fines T, MD   40 mg at 06/28/21 2154   senna-docusate (Senokot-S) tablet 1 tablet  1 tablet Oral QHS PRN Wynetta Fines T, MD       valproic acid (DEPAKENE) 250 MG capsule 250 mg  250 mg Oral TID Kerney Elbe, MD   250 mg at 06/28/21 2155     Discharge Medications: Please see discharge summary for a list of discharge medications.  Relevant Imaging Results:  Relevant Lab Results:   Additional Information SSN: [id number]  Joanne Chars, LCSW

## 2021-06-29 NOTE — Progress Notes (Signed)
PROGRESS NOTE    Crystal Moore  OAC:[id number] DOB: July 11, 1946 DOA: 06/27/2021 PCP: [name], MD   Brief Narrative:  Crystal Moore is a 75 y.o. female with medical history significant of PVD with B/L leg stenting on ASA and Plavix, IDDM, seizure off anti-seizure meds, Stroke with residue left sided weakness, chronic ambulation dysfunction,COPD came with fever and stoke like symptoms with slurred speech and worsening L sided facial droop first noted over a week ago.  Assessment & Plan:   Sepsis secondary flu, POA Acute hypoxic respiratory failure, POA -Continue Tamiflu x5 days -to complete 07/01/2021 -Continue supportive care, supplemental oxygen, wean as tolerated -Fevers downtrending appropriately   Rule out seizure with Todds paralysis Acute punctate cerebellar infarct Acute metabolic encephalopathy  -Chronic left-sided weakness from previous stroke -Acute punctate cerebellar infarct on MRI -Neurology following, appreciate insight and recommendations -Continue Plavix, statin -off aspirin due to previous history of GI bleed -EEG/further imaging per neurology -Report of noncompliance versus discontinuation of seizure medications -Echo without overt thrombus or valvular abnormalities  IDDM -A1c 8.7 indicative of poorly controlled diabetes with hyperglycemia  -Continue home long-acting 15 units at bedtime with sliding scale, hypoglycemic protocol  -Continue Tradjenta   Chronic left foot/ankle wound and chronic ambulation dysfunction -With a baseline PVD and stenting in leg -PT/wound care following   COPD, not in acute exacerbation -Continue home Proventil, Ventolin   DVT prophylaxis: Heparin subcu Code Status: Full code Family Communication: None available, both numbers listed for family go directly to voicemail that is either full or has not been set up, unable to leave a message  Status is: Inpatient  Dispo: The patient is from: Home               Anticipated d/c is to: SNF              Anticipated d/c date is: 24 to 48 hours              Patient currently not medically stable for discharge  Consultants:  Neuro  Procedures:  None  Antimicrobials:  Tamiflu  Subjective: No acute issues or events overnight, denies nausea vomiting diarrhea constipation headache fevers chills or chest pain  Objective: Vitals:   06/28/21 1607 06/28/21 1935 06/28/21 2339 06/29/21 0357  BP: (!) 131/49 (!) 124/51 (!) 122/46 (!) 123/42  Pulse: 63 62 72 68  Resp: 14 18 18 18   Temp: 98 F (36.7 C) 98.2 F (36.8 C) 98.6 F (37 C) 98 F (36.7 C)  TempSrc: Oral Oral Oral Oral  SpO2: 99% 94% 95% 97%  Weight:      Height:       No intake or output data in the 24 hours ending 06/29/21 0736  Filed Weights   06/27/21 0900 06/27/21 2043  Weight: 65.4 kg 64.5 kg    Examination:  General:  Pleasantly resting in bed, No acute distress. HEENT:  Normocephalic atraumatic.  Sclerae nonicteric, noninjected.  Extraocular movements intact bilaterally. Neck:  Without mass or deformity.  Trachea is midline. Lungs:  Clear to auscultate bilaterally without rhonchi, wheeze, or rales. Heart:  Regular rate and rhythm.  Without murmurs, rubs, or gallops. Abdomen:  Soft, nontender, nondistended.  Without guarding or rebound. Extremities: Without cyanosis, clubbing, edema, or obvious deformity. Vascular:  Dorsalis pedis and posterior tibial pulses palpable bilaterally. Skin:  Warm and dry, no erythema.   Data Reviewed: I have personally reviewed following labs and imaging studies  CBC: Recent Labs  Lab 06/27/21 0951  06/27/21 1050 06/29/21 0453  WBC  --  8.9 6.2  NEUTROABS  --  5.8  --   HGB 14.6 13.2 10.6*  HCT 43.0 41.9 32.1*  MCV  --  82.2 78.7*  PLT  --  210 366    Basic Metabolic Panel: Recent Labs  Lab 06/27/21 0951 06/27/21 2116 06/29/21 0453  NA 140 135 136  K 4.2 3.6 3.4*  CL 102 103 103  CO2  --  23 26  GLUCOSE 147* 200* 115*   BUN 23 17 17   CREATININE 1.10* 0.96 0.92  CALCIUM  --  8.0* 8.2*    GFR: Estimated Creatinine Clearance: 47.3 mL/min (by C-G formula based on SCr of 0.92 mg/dL). Liver Function Tests: Recent Labs  Lab 06/27/21 2116  AST 37  ALT 28  ALKPHOS 73  BILITOT 0.6  PROT 6.2*  ALBUMIN 2.5*    No results for input(s): LIPASE, AMYLASE in the last 168 hours. No results for input(s): AMMONIA in the last 168 hours. Coagulation Profile: Recent Labs  Lab 06/27/21 1050  INR 1.1    Cardiac Enzymes: No results for input(s): CKTOTAL, CKMB, CKMBINDEX, TROPONINI in the last 168 hours. BNP (last 3 results) No results for input(s): PROBNP in the last 8760 hours. HbA1C: Recent Labs    06/27/21 2116 06/28/21 0107  HGBA1C 8.7* 8.8*    CBG: Recent Labs  Lab 06/28/21 0603 06/28/21 1117 06/28/21 1725 06/28/21 2101 06/29/21 0602  GLUCAP 110* 170* 84 126* 117*    Lipid Profile: Recent Labs    06/28/21 0107  CHOL 84  HDL 31*  LDLCALC 46  TRIG 33  CHOLHDL 2.7    Thyroid Function Tests: No results for input(s): TSH, T4TOTAL, FREET4, T3FREE, THYROIDAB in the last 72 hours. Anemia Panel: No results for input(s): VITAMINB12, FOLATE, FERRITIN, TIBC, IRON, RETICCTPCT in the last 72 hours. Sepsis Labs: Recent Labs  Lab 06/27/21 1050  LATICACIDVEN 1.7     Recent Results (from the past 240 hour(s))  Blood culture (routine x 2)     Status: None (Preliminary result)   Collection Time: 06/27/21 10:51 AM   Specimen: BLOOD RIGHT WRIST  Result Value Ref Range Status   Specimen Description BLOOD RIGHT WRIST  Final   Special Requests   Final    BOTTLES DRAWN AEROBIC AND ANAEROBIC Blood Culture results may not be optimal due to an inadequate volume of blood received in culture bottles   Culture   Final    NO GROWTH < 24 HOURS Performed at [facility], [place], [place], [place] [address]    Report Status PENDING  Incomplete  Resp Panel by RT-PCR (Flu A&B, Covid)  Nasopharyngeal Swab     Status: Abnormal   Collection Time: 06/27/21 11:07 AM   Specimen: Nasopharyngeal Swab; Nasopharyngeal(NP) swabs in vial transport medium  Result Value Ref Range Status   SARS Coronavirus 2 by RT PCR NEGATIVE NEGATIVE Final    Comment: (NOTE) SARS-CoV-2 target nucleic acids are NOT DETECTED.  The SARS-CoV-2 RNA is generally detectable in upper respiratory specimens during the acute phase of infection. The lowest concentration of SARS-CoV-2 viral copies this assay can detect is 138 copies/mL. A negative result does not preclude SARS-Cov-2 infection and should not be used as the sole basis for treatment or other patient management decisions. A negative result may occur with  improper specimen collection/handling, submission of specimen other than nasopharyngeal swab, presence of viral mutation(s) within the areas targeted by this assay, and  inadequate number of viral copies(<138 copies/mL). A negative result must be combined with clinical observations, patient history, and epidemiological information. The expected result is Negative.  Fact Sheet for Patients:  EntrepreneurPulse.com.au  Fact Sheet for Healthcare Providers:  IncredibleEmployment.be  This test is no t yet approved or cleared by the [place] FDA and  has been authorized for detection and/or diagnosis of SARS-CoV-2 by FDA under an Emergency Use Authorization (EUA). This EUA will remain  in effect (meaning this test can be used) for the duration of the COVID-19 declaration under Section 564(b)(1) of the Act, 21 U.S.C.section 360bbb-3(b)(1), unless the authorization is terminated  or revoked sooner.       Influenza A by PCR POSITIVE (A) NEGATIVE Final   Influenza B by PCR NEGATIVE NEGATIVE Final    Comment: (NOTE) The Xpert Xpress SARS-CoV-2/FLU/RSV plus assay is intended as an aid in the diagnosis of influenza from Nasopharyngeal swab specimens  and should not be used as a sole basis for treatment. Nasal washings and aspirates are unacceptable for Xpert Xpress SARS-CoV-2/FLU/RSV testing.  Fact Sheet for Patients: EntrepreneurPulse.com.au  Fact Sheet for Healthcare Providers: IncredibleEmployment.be  This test is not yet approved or cleared by the [place] FDA and has been authorized for detection and/or diagnosis of SARS-CoV-2 by FDA under an Emergency Use Authorization (EUA). This EUA will remain in effect (meaning this test can be used) for the duration of the COVID-19 declaration under Section 564(b)(1) of the Act, 21 U.S.C. section 360bbb-3(b)(1), unless the authorization is terminated or revoked.  Performed at [facility], [place] [address], [place], [place] [address]           Radiology Studies: MR BRAIN WO CONTRAST  Result Date: 06/27/2021 CLINICAL DATA:  Slurred speech, left facial droop EXAM: MRI HEAD WITHOUT CONTRAST TECHNIQUE: Multiplanar, multiecho pulse sequences of the brain and surrounding structures were obtained without intravenous contrast. COMPARISON:  Same-day CT/CTA head and neck FINDINGS: Image quality is intermittently degraded by motion artifact. Specifically, the axial T1 images are moderately motion degraded. Brain: There is a punctate focus of diffusion restriction in the left cerebellar hemisphere consistent with acute infarct. There is no other evidence of acute infarct. There is no acute intracranial hemorrhage or extra-axial fluid collection. There is global parenchymal volume loss with marked enlargement of the ventricular system. There is confluent FLAIR signal abnormality in the subcortical and periventricular white matter likely reflecting sequela of chronic white matter microangiopathy. There is a remote infarct in the right ACA distribution, unchanged. There are small remote infarcts in the bilateral cerebellar hemispheres and right cerebral  peduncle. There is no solid mass lesion.  There is no midline shift. Vascular: Normal flow voids. Skull and upper cervical spine: Normal marrow signal. Sinuses/Orbits: The paranasal sinuses are clear. The globes and orbits are unremarkable. Other: None. IMPRESSION: 1. Punctate acute infarct in the left cerebellar hemisphere. 2. Global parenchymal volume loss with right worse than left hippocampal atrophy and chronic white matter microangiopathy. 3. Remote infarcts as above. Electronically Signed   By: Valetta Mole M.D.   On: 06/27/2021 16:55   DG Chest Port 1 View  Result Date: 06/27/2021 CLINICAL DATA:  Fever.  Stroke evaluation. EXAM: PORTABLE CHEST 1 VIEW COMPARISON:  Radiographs 09/14/2020 and 08/26/2020.  CT 02/15/2018. FINDINGS: 1113 hours. Patient is rotated to the left, and the extreme right lung apex is excluded. The heart size and mediastinal contours are stable with aortic atherosclerosis. Interstitial prominence has increased, suspicious for increased pulmonary  edema. There is no confluent airspace opacity, significant pleural effusion or pneumothorax. The bones appear unchanged. Multiple telemetry leads overlie the chest. IMPRESSION: Interval diffusely increased interstitial prominence suspicious for interstitial pulmonary edema. Electronically Signed   By: Richardean Sale M.D.   On: 06/27/2021 11:34   DG Foot 2 Views Left  Result Date: 06/27/2021 CLINICAL DATA:  Generic left foot pain EXAM: LEFT FOOT - 2 VIEW COMPARISON:  None. FINDINGS: No acute fracture or dislocation. No aggressive osseous lesion. Normal alignment. Generalized osteopenia. Mild osteoarthritis of the first MTP joint and first IP joint. Small plantar calcaneal spur. Enthesopathic changes at the Achilles tendon insertion. In Soft tissue are unremarkable. No radiopaque foreign body or soft tissue emphysema. IMPRESSION: No acute osseous injury of the left foot. Electronically Signed   By: Kathreen Devoid M.D.   On: 06/27/2021 14:09    EEG adult  Result Date: 06/27/2021 Lora Havens, MD     06/27/2021  1:02 PM Patient Name: Crystal Moore MRN: [id number] Epilepsy Attending: Lora Havens Referring Physician/Provider: Dr Kerney Elbe Date: 06/27/2021 Duration: 36.34 mins Patient history: 75 year old female with chronic left sided weakness secondary to prior stroke, presenting as a Code Stroke with acute onset of mutism, left facial droop and left sided drooling.  EEG to evaluate for seizure. Level of alertness: Awake AEDs during EEG study: Technical aspects: This EEG study was done with scalp electrodes positioned according to the 10-20 International system of electrode placement. Electrical activity was acquired at a sampling rate of 500Hz  and reviewed with a high frequency filter of 70Hz  and a low frequency filter of 1Hz . EEG data were recorded continuously and digitally stored. Description: The posterior dominant rhythm consists of 8 Hz activity of moderate voltage (25-35 uV) seen predominantly in posterior head regions, symmetric and reactive to eye opening and eye closing. Drowsiness was characterized by attenuation of the posterior background rhythm. EEG showed intermittent generalized and maximal right frontal region 3 to 6 Hz theta-delta slowing.  Spikes were also noted in the right hemisphere, maximal right frontal region which at times appeared quasiperiodic at 1 Hz. Hyperventilation and photic stimulation were not performed.   ABNORMALITY - Spike, right hemisphere, maximal right frontal region - Intermittent slow, generalized and maximal right frontal region IMPRESSION: This study showed evidence of epileptogenicity and cortical dysfunction arising from right hemisphere, maximal right frontal region likely secondary to underlying structural abnormality and high risk for seizure recurrence . Additionally EEG suggestive of mild diffuse encephalopathy, nonspecific etiology.  No seizures were seen throughout the recording. Dr  Cheral Marker was notified Lora Havens   ECHOCARDIOGRAM COMPLETE  Result Date: 06/28/2021    ECHOCARDIOGRAM REPORT   Patient Name:   TEYANA PIERRON [facility] Date of Exam: 06/28/2021 Medical Rec #:  [id number]       Height:       62.0 in Accession #:    [id number]      Weight:       142.2 lb Date of Birth:  05/07/1946        BSA:          1.654 m Patient Age:    66 years        BP:           162/72 mmHg Patient Gender: F               HR:           72 bpm. Exam Location:  Inpatient Procedure:  2D Echo, Cardiac Doppler and Color Doppler Indications:    TIA  History:        Patient has prior history of Echocardiogram examinations, most                 recent 12/18/2018. COPD and Stroke, Signs/Symptoms:Altered Mental                 Status; Risk Factors:Diabetes, Dyslipidemia and Hypertension.                 Tobacco abuse.  Sonographer:    Wenda Low Referring Phys: [id number] [place]  1. Left ventricular ejection fraction, by estimation, is 60 to 65%. The left ventricle has normal function. The left ventricle has no regional wall motion abnormalities. Left ventricular diastolic parameters were normal.  2. Right ventricular systolic function is normal. The right ventricular size is normal.  3. The mitral valve is normal in structure. Trivial mitral valve regurgitation. No evidence of mitral stenosis.  4. The aortic valve is normal in structure. Aortic valve regurgitation is not visualized. No aortic stenosis is present. FINDINGS  Left Ventricle: Left ventricular ejection fraction, by estimation, is 60 to 65%. The left ventricle has normal function. The left ventricle has no regional wall motion abnormalities. The left ventricular internal cavity size was normal in size. There is  no left ventricular hypertrophy. Left ventricular diastolic parameters were normal. Right Ventricle: The right ventricular size is normal. No increase in right ventricular wall thickness. Right ventricular systolic function is  normal. Left Atrium: Left atrial size was normal in size. Right Atrium: Right atrial size was normal in size. Pericardium: There is no evidence of pericardial effusion. Mitral Valve: The mitral valve is normal in structure. Trivial mitral valve regurgitation. No evidence of mitral valve stenosis. MV peak gradient, 3.2 mmHg. The mean mitral valve gradient is 1.0 mmHg. Tricuspid Valve: The tricuspid valve is normal in structure. Tricuspid valve regurgitation is trivial. Aortic Valve: The aortic valve is normal in structure. Aortic valve regurgitation is not visualized. No aortic stenosis is present. Aortic valve mean gradient measures 6.0 mmHg. Aortic valve peak gradient measures 10.1 mmHg. Aortic valve area, by VTI measures 1.97 cm. Pulmonic Valve: The pulmonic valve was normal in structure. Pulmonic valve regurgitation is not visualized. Aorta: The aortic root and ascending aorta are structurally normal, with no evidence of dilitation. IAS/Shunts: The atrial septum is grossly normal.  LEFT VENTRICLE PLAX 2D LVIDd:         4.30 cm     Diastology LVIDs:         2.60 cm     LV e' medial:    5.70 cm/s LV PW:         1.10 cm     LV E/e' medial:  10.8 LV IVS:        1.10 cm     LV e' lateral:   6.45 cm/s LVOT diam:     2.00 cm     LV E/e' lateral: 9.6 LV SV:         75 LV SV Index:   46 LVOT Area:     3.14 cm  LV Volumes (MOD) LV vol d, MOD A2C: 47.2 ml LV vol d, MOD A4C: 51.8 ml LV vol s, MOD A2C: 30.1 ml LV vol s, MOD A4C: 20.6 ml LV SV MOD A2C:     17.1 ml LV SV MOD A4C:     51.8 ml LV SV MOD BP:  27.6 ml RIGHT VENTRICLE RV Basal diam:  2.85 cm RV Mid diam:    2.60 cm RV S prime:     13.10 cm/s TAPSE (M-mode): 1.9 cm LEFT ATRIUM             Index        RIGHT ATRIUM           Index LA diam:        3.50 cm 2.12 cm/m   RA Area:     11.10 cm LA Vol (A2C):   36.3 ml 21.95 ml/m  RA Volume:   23.30 ml  14.09 ml/m LA Vol (A4C):   28.3 ml 17.11 ml/m LA Biplane Vol: 34.4 ml 20.80 ml/m  AORTIC VALVE                      PULMONIC VALVE AV Area (Vmax):    2.05 cm      PV Vmax:       0.79 m/s AV Area (Vmean):   1.85 cm      PV Peak grad:  2.5 mmHg AV Area (VTI):     1.97 cm AV Vmax:           159.00 cm/s AV Vmean:          114.000 cm/s AV VTI:            0.382 m AV Peak Grad:      10.1 mmHg AV Mean Grad:      6.0 mmHg LVOT Vmax:         104.00 cm/s LVOT Vmean:        67.000 cm/s LVOT VTI:          0.240 m LVOT/AV VTI ratio: 0.63  AORTA Ao Root diam: 2.50 cm MITRAL VALVE MV Area (PHT): 3.08 cm    SHUNTS MV Area VTI:   2.67 cm    Systemic VTI:  0.24 m MV Peak grad:  3.2 mmHg    Systemic Diam: 2.00 cm MV Mean grad:  1.0 mmHg MV Vmax:       0.89 m/s MV Vmean:      45.8 cm/s MV Decel Time: 246 msec MV E velocity: 61.80 cm/s MV A velocity: 93.30 cm/s MV E/A ratio:  0.66 Mertie Moores MD Electronically signed by Mertie Moores MD Signature Date/Time: 06/28/2021/2:20:14 PM    Final    CT HEAD CODE STROKE WO CONTRAST  Result Date: 06/27/2021 CLINICAL DATA:  Code stroke. Neuro deficit, acute, stroke suspected. Additional history provided: Left-sided deficit, slurred speech, nonambulatory. EXAM: CT HEAD WITHOUT CONTRAST TECHNIQUE: Contiguous axial images were obtained from the base of the skull through the vertex without intravenous contrast. COMPARISON:  Head CT 01/30/2021 and earlier. Brain MRI 01/03/2019. MRA head 01/14/2019. FINDINGS: Brain: Mild generalized cerebral and cerebellar atrophy. Redemonstrated chronic right ACA territory cortical/subcortical infarct within the medial right frontal lobe. Redemonstrated chronic infarcts within the bilateral cerebral hemispheric white matter, deep gray nuclei, posterior limb of right internal capsule and right midbrain. Background advanced patchy and confluent hypoattenuation within the cerebral white matter, nonspecific but compatible with chronic small vessel ischemic disease. Redemonstrated small chronic infarcts within the bilateral cerebellar hemispheres. There is no acute intracranial  hemorrhage. No acute demarcated cortical infarct is identified. No extra-axial fluid collection. No evidence of an intracranial mass. No midline shift. Vascular: No hyperdense vessel.  Atherosclerotic calcifications. Skull: Normal. Negative for fracture or focal lesion. Sinuses/Orbits: Visualized orbits show no acute finding. mild mucosal  thickening within the bilateral ethmoid and left maxillary sinuses at the imaged levels. ASPECTS [facility] Stroke Program Early CT Score) - Ganglionic level infarction (caudate, lentiform nuclei, internal capsule, insula, M1-M3 cortex): 7 - Supraganglionic infarction (M4-M6 cortex): 3 Total score (0-10 with 10 being normal): 10 These results were communicated to Dr. Cheral Marker at 10:14amon 12/2/2022by text page via the [facility] messaging system. IMPRESSION: Motion degraded exam. No acute intracranial abnormality is identified.  ASPECTS is 10. Redemonstrated parenchymal atrophy, chronic ischemic changes and chronic infarcts, as detailed. Electronically Signed   By: Kellie Simmering D.O.   On: 06/27/2021 10:17   CT ANGIO HEAD NECK W WO CM (CODE STROKE)  Addendum Date: 06/27/2021   ADDENDUM REPORT: 06/27/2021 19:29 ADDENDUM: CTA neck impression #4 will be called to the hospitalist provider by the Radiologist Assistant, and communication documented in the PACS or [employer]. Electronically Signed   By: Kellie Simmering D.O.   On: 06/27/2021 19:29   Result Date: 06/27/2021 CLINICAL DATA:  Neuro deficit, acute, stroke suspected. Aside deficit, slurred speech, nonambulatory. EXAM: CT ANGIOGRAPHY HEAD AND NECK TECHNIQUE: Multidetector CT imaging of the head and neck was performed using the standard protocol during bolus administration of intravenous contrast. Multiplanar CT image reconstructions and MIPs were obtained to evaluate the vascular anatomy. Carotid stenosis measurements (when applicable) are obtained utilizing NASCET criteria, using the distal internal carotid diameter as the  denominator. CONTRAST:  67mL OMNIPAQUE IOHEXOL 350 MG/ML SOLN COMPARISON:  Noncontrast head CT performed earlier today 06/27/2021. MRA head 01/14/2019. CT angiogram head/neck 12/18/2018. Chest CT 02/15/2018 FINDINGS: CTA NECK FINDINGS Aortic arch: Common origin of the innominate and left common carotid arteries. Atherosclerotic plaque within the visualized aortic arch and proximal major branch vessels of the neck. Streak and beam hardening artifact arising from a dense right-sided contrast bolus partially obscures the right subclavian artery. Within this is a shin, no hemodynamically significant stenosis is identified within the innominate or proximal right subclavian artery. Atherosclerotic plaque within the left subclavian artery results in up to 50% stenosis, unchanged. Right carotid system: CCA and ICA patent within the neck. Prominent soft and calcified plaque within the carotid bifurcation and proximal ICA. Resultant severe stenosis at the origin of the right ICA estimated at 80%, slightly progressed as compared to the prior examination of 12/18/2018. Distal to this, the ICA is patent within the neck without stenosis. Left carotid system: CCA and ICA patent within the neck. Scattered soft and calcified plaque within the left carotid system. Most notably, there is prominent soft calcified plaque within the carotid bifurcation and proximal ICA. Estimated stenosis at the origin of the left ICA of 90%, progressed. Distal to this, the ICA is patent within the neck without stenosis. Vertebral arteries: The dominant right vertebral artery is patent within the neck. Unchanged mild atherosclerotic narrowing at the origin of this vessel. As before, the non dominant left vertebral artery is occluded within the neck to the C5 level. Distal to this, there is irregular and faint opacification of the left vertebral artery, likely due to retrograde flow. Skeleton: Cervical spondylosis. No acute bony abnormality or aggressive  osseous lesion. Other neck: Subcentimeter nodules within the thyroid gland, not meeting consensus criteria for ultrasound follow-up based on size. No neck mass or cervical lymphadenopathy. Upper chest: Redemonstrated findings of fibrotic lung disease. New from prior examinations, there is an irregular opacity within the right upper lobe measuring 2.9 cm. Also new from prior exams, and irregular opacity is present within the left upper lobe  measuring up to 4.5 cm in greatest dimension. These findings may be infectious/inflammatory, but are suspicious for possible neoplasm. Review of the MIP images confirms the above findings CTA HEAD FINDINGS Anterior circulation: The intracranial internal carotid arteries are patent. Calcified plaque within both vessels. Up to moderate stenosis within the cavernous right ICA. Moderate/severe stenosis within the distal petrous/proximal cavernous left ICA, progressed. The M1 middle cerebral arteries are patent. Atherosclerotic irregularity of the M2 and more distal middle cerebral artery vessels, bilaterally, most notably as follows. Redemonstrated severe stenosis within a mid to distal M2 right MCA vessel (series 10, image 17). Redemonstrated moderate stenosis within a superior division proximal M2 left MCA vessel. Sites of progressive severe stenosis within a distal M2 left MCA vessel. The anterior cerebral arteries are patent. Redemonstrated mild to moderate stenosis within the right A1 segment. No intracranial aneurysm is identified. Posterior circulation: The dominant intracranial right vertebral artery is patent. Mild nonstenotic calcified plaque within this vessel. The intracranial left vertebral artery is developmentally diminutive with superimposed foci of high-grade stenosis. The basilar artery is patent. The posterior cerebral arteries are patent. Atherosclerotic irregularity of both vessels. Redemonstrated moderate stenosis within the P2 right PCA. Progressive severe  stenosis within the right PCA P3 segment. Redemonstrated moderate stenosis within the P2 left PCA. Posterior communicating arteries are diminutive or absent bilaterally. Venous sinuses: Within the limitations of contrast timing, no convincing thrombus. Anatomic variants: As described. Review of the MIP images confirms the above findings No emergent large vessel occlusion. These results were communicated to Dr. Cheral Marker At 10:59 amon 12/2/2022by text page via the [facility] messaging system. IMPRESSION: CTA neck: 1. The common carotid and internal carotid arteries are patent within the neck. Atherosclerotic stenosis at the origin of the right ICA estimated at 80%, progressed from the prior examination of 12/17/2020. Atherosclerotic stenosis at the origin of the left ICA estimated at 90%, also progressed. 2. The dominant right vertebral artery is patent within the neck with unchanged mild atherosclerotic narrowing at the origin of this vessel. 3. 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 Distal to this, there is irregular and faint opacification of the left vertebral artery, likely due to retrograde flow. 4. Irregular opacities within the bilateral upper lobes measuring up to 4.5 cm, new from prior examinations. While these findings may be infectious/inflammatory in etiology, they are suspicious for possible pulmonary neoplasm. A dedicated contrast-enhanced chest CT is recommended for initial further evaluation. CTA head: 1. No intracranial large vessel occlusion is identified. 2. Intracranial atherosclerotic disease with multifocal stenoses, most notably as follows. 3. Redemonstrated sites of up to moderate stenosis within the cavernous right ICA. 4. Moderate/severe stenosis within the distal petrous/proximal cavernous left ICA, progressed. 5. Redemonstrated severe stenosis within a mid to distal right M2 MCA vessel. 6. Redemonstrated moderate stenosis within a superior division proximal  M2 left MCA vessel. 7. Sites of progressive severe stenosis within a distal M2 left MCA vessel. 8. Redemonstrated sites of severe stenosis within the V4 left vertebral artery. 9. Redemonstrated moderate stenosis within the P2 right PCA. 10. Progressive severe stenosis within the P3 right PCA. 11. Redemonstrated moderate stenosis within the P2 left PCA. Electronically Signed: By: Kellie Simmering D.O. On: 06/27/2021 10:59    Scheduled Meds:  amLODipine  5 mg Oral Daily   atorvastatin  80 mg Oral Daily   clopidogrel  75 mg Oral Daily   feeding supplement  237 mL Oral BID BM   gabapentin  300  mg Oral TID   heparin  5,000 Units Subcutaneous Q12H   insulin aspart  0-9 Units Subcutaneous TID WC   insulin glargine-yfgn  10 Units Subcutaneous QHS   linagliptin  5 mg Oral Daily   oseltamivir  30 mg Oral BID   pantoprazole  40 mg Oral BID   valproic acid  250 mg Oral TID    LOS: 2 days   Time spent: 37min  Audia Amick C Devone Bonilla, DO [facility]  If 7PM-7AM, please contact night-coverage [email or web address]  06/29/2021, 7:36 AM

## 2021-06-30 LAB — GLUCOSE, CAPILLARY
Glucose-Capillary: 100 mg/dL — ABNORMAL HIGH (ref 70–99)
Glucose-Capillary: 95 mg/dL (ref 70–99)
Glucose-Capillary: 102 mg/dL — ABNORMAL HIGH (ref 70–99)

## 2021-06-30 MED ORDER — OSELTAMIVIR PHOSPHATE 30 MG PO CAPS: 30.0000 mg | ORAL_CAPSULE | Freq: Two times a day (BID) | ORAL | 0 refills | Status: DC

## 2021-06-30 MED ORDER — VALPROIC ACID 250 MG PO CAPS: 250.0000 mg | ORAL_CAPSULE | Freq: Three times a day (TID) | ORAL | 1 refills | Status: DC

## 2021-06-30 NOTE — Care Management Important Message (Signed)
Important Message  Patient Details  Name: Crystal Moore MRN: [id number] Date of Birth: 02-Jul-1946   Medicare Important Message Given:  Yes  Patient had a precaution order in place will mail to the patients home address   [name] 06/30/2021, 3:13 PM

## 2021-06-30 NOTE — TOC Transition Note (Signed)
Transition of Care [facility]) - CM/SW Discharge Note   Patient Details  Name: Crystal Moore MRN: [id number] Date of Birth: 1945/10/01  [facility] CM/SW Contact:  Pollie Friar, RN Phone Number: 06/30/2021, 11:29 AM   Clinical Narrative:    Patient is discharging home with daughter. They have all needed DME. They inquired about different bed rails for her hospital bed at home. CM inquired with [employer] and they will f/u with the family on trading out the bed vs rails.  Pt is active with [facility] for home health services. CM has updated Amy with [facility] on d/c home today with resumption orders.  Pt's daughter to provide transport home.   Final next level of care: Home w Home Health Services Barriers to Discharge: No Barriers Identified   Patient Goals and CMS Choice   CMS Medicare.gov Compare Post Acute Care list provided to:: Patient Choice offered to / list presented to : Adult Children  Discharge Placement                       Discharge Plan and Services     Post Acute Care Choice: Home Health                    HH Arranged: RN, PT, OT [facility] Agency: [place] Date [facility] Agency Contacted: 06/30/21   Representative spoke with at [place]: Amy--voicemail  Social Determinants of Health ([place]) Interventions     Readmission Risk Interventions Readmission Risk Prevention Plan 09/18/2020  Transportation Screening Complete  PCP or Specialist Appt within 3-5 Days Complete  Social Work Consult for [place] Planning/Counseling [place] Not Applicable  Medication Review Press photographer) Complete  Some recent data might be hidden

## 2021-06-30 NOTE — Discharge Summary (Signed)
Physician Discharge Summary  Crystal Moore MPN:[id number] DOB: 03/15/1946 DOA: 06/27/2021  PCP: [name], MD  Admit date: 06/27/2021 Discharge date: 06/30/2021  Admitted From: Home Disposition: Home  Recommendations for Outpatient Follow-up:  Follow up with PCP in 1-2 weeks Please obtain BMP/CBC in one week  Home Health: Home health PT Equipment/Devices: None  Discharge Condition: Stable CODE STATUS: Full Diet recommendation: As tolerated  Brief/Interim Summary: Crystal Moore is a 75 y.o. female with medical history significant of PVD with B/L leg stenting on ASA and Plavix, IDDM, seizure off anti-seizure meds, Stroke with residue left sided weakness, chronic ambulation dysfunction,COPD came with fever and stoke like symptoms with slurred speech and worsening L sided facial droop first noted over a week ago.   Assessment & Plan:   Sepsis secondary flu, POA, resolving Acute hypoxic respiratory failure, POA, resolved -Continue Tamiflu x5 days -to complete 07/01/2021 -Hypoxia weaned off this morning   Rule out seizure with Todds paralysis Acute punctate cerebellar infarct Acute metabolic encephalopathy  -Chronic left-sided weakness from previous stroke -Acute punctate cerebellar infarct on MRI -questionably incidental -Neurology following, appreciate insight and recommendations -no new medications at this time -Continue Plavix, statin -off aspirin due to previous history of GI bleed -Report of noncompliance with seizure medications, continue to impress upon the patient the need for ongoing medication compliance -Echo without overt thrombus or valvular abnormalities   IDDM, uncontrolled -A1c 8.7 indicative of uncontrolled diabetes with hyperglycemia  -Continue home long-acting 15 units at bedtime with sliding scale, hypoglycemic protocol  -Continue Tradjenta   Chronic left foot/ankle wound and chronic ambulation dysfunction -With a baseline PVD and stenting in  leg -PT/wound care following   COPD, not in acute exacerbation -Continue home Proventil, Ventolin  Discharge Instructions  Discharge Instructions     Discharge patient   Complete by: As directed    Discharge disposition: 06-Home-Health Care Svc   Discharge patient date: 06/30/2021      Allergies as of 06/30/2021       Reactions   Aspirin    Can not take due to previous GI bleed - per daughter        Medication List     TAKE these medications    Accu-Chek Guide test strip Generic drug: glucose blood USE TO test AS DIRECTED TWICE DAILY   acetaminophen 500 MG tablet Commonly known as: TYLENOL Take 1,000 mg by mouth every 6 (six) hours as needed (pain).   albuterol 108 (90 Base) MCG/ACT inhaler Commonly known as: VENTOLIN HFA Inhale 1 puff into the lungs every 6 (six) hours as needed for wheezing or shortness of breath.   albuterol (2.5 MG/3ML) 0.083% nebulizer solution Commonly known as: PROVENTIL Inhale 3 mLs (2.5 mg total) into the lungs every 4 (four) hours as needed for wheezing or shortness of breath.   amLODipine 5 MG tablet Commonly known as: NORVASC Take 5 mg by mouth daily.   atorvastatin 80 MG tablet Commonly known as: LIPITOR Take 1 tablet (80 mg total) by mouth daily at 6 PM. What changed: when to take this   clopidogrel 75 MG tablet Commonly known as: PLAVIX Take 1 tablet (75 mg total) by mouth daily.   feeding supplement Liqd Take 237 mLs by mouth 2 (two) times daily between meals.   gabapentin 300 MG capsule Commonly known as: NEURONTIN Take 1 capsule (300 mg total) by mouth 3 (three) times daily.   insulin glargine 100 UNIT/ML injection Commonly known as: LANTUS Inject 0.15 mLs (15  Units total) into the skin daily. What changed: how much to take   linagliptin 5 MG Tabs tablet Commonly known as: TRADJENTA Take 5 mg by mouth daily.   oseltamivir 30 MG capsule Commonly known as: TAMIFLU Take 1 capsule (30 mg total) by mouth 2 (two)  times daily.   pantoprazole 40 MG tablet Commonly known as: PROTONIX Take 1 tablet (40 mg total) by mouth 2 (two) times daily.   valproic acid 250 MG capsule Commonly known as: DEPAKENE Take 1 capsule (250 mg total) by mouth 3 (three) times daily.        Follow-up Information     [facility].   Specialty: Home Health Services Why: ENHABIT--They will contact you for the next home visit. Contact information: [place] [address] [phone]                Allergies  Allergen Reactions   Aspirin     Can not take due to previous GI bleed - per daughter    Consultations: Neuro   Procedures/Studies: MR BRAIN WO CONTRAST  Result Date: 06/27/2021 CLINICAL DATA:  Slurred speech, left facial droop EXAM: MRI HEAD WITHOUT CONTRAST TECHNIQUE: Multiplanar, multiecho pulse sequences of the brain and surrounding structures were obtained without intravenous contrast. COMPARISON:  Same-day CT/CTA head and neck FINDINGS: Image quality is intermittently degraded by motion artifact. Specifically, the axial T1 images are moderately motion degraded. Brain: There is a punctate focus of diffusion restriction in the left cerebellar hemisphere consistent with acute infarct. There is no other evidence of acute infarct. There is no acute intracranial hemorrhage or extra-axial fluid collection. There is global parenchymal volume loss with marked enlargement of the ventricular system. There is confluent FLAIR signal abnormality in the subcortical and periventricular white matter likely reflecting sequela of chronic white matter microangiopathy. There is a remote infarct in the right ACA distribution, unchanged. There are small remote infarcts in the bilateral cerebellar hemispheres and right cerebral peduncle. There is no solid mass lesion.  There is no midline shift. Vascular: Normal flow voids. Skull and upper cervical spine: Normal marrow signal.  Sinuses/Orbits: The paranasal sinuses are clear. The globes and orbits are unremarkable. Other: None. IMPRESSION: 1. Punctate acute infarct in the left cerebellar hemisphere. 2. Global parenchymal volume loss with right worse than left hippocampal atrophy and chronic white matter microangiopathy. 3. Remote infarcts as above. Electronically Signed   By: Valetta Mole M.D.   On: 06/27/2021 16:55   DG Chest Port 1 View  Result Date: 06/27/2021 CLINICAL DATA:  Fever.  Stroke evaluation. EXAM: PORTABLE CHEST 1 VIEW COMPARISON:  Radiographs 09/14/2020 and 08/26/2020.  CT 02/15/2018. FINDINGS: 1113 hours. Patient is rotated to the left, and the extreme right lung apex is excluded. The heart size and mediastinal contours are stable with aortic atherosclerosis. Interstitial prominence has increased, suspicious for increased pulmonary edema. There is no confluent airspace opacity, significant pleural effusion or pneumothorax. The bones appear unchanged. Multiple telemetry leads overlie the chest. IMPRESSION: Interval diffusely increased interstitial prominence suspicious for interstitial pulmonary edema. Electronically Signed   By: Richardean Sale M.D.   On: 06/27/2021 11:34   DG Foot 2 Views Left  Result Date: 06/27/2021 CLINICAL DATA:  Generic left foot pain EXAM: LEFT FOOT - 2 VIEW COMPARISON:  None. FINDINGS: No acute fracture or dislocation. No aggressive osseous lesion. Normal alignment. Generalized osteopenia. Mild osteoarthritis of the first MTP joint and first IP joint. Small plantar calcaneal spur. Enthesopathic  changes at the Achilles tendon insertion. In Soft tissue are unremarkable. No radiopaque foreign body or soft tissue emphysema. IMPRESSION: No acute osseous injury of the left foot. Electronically Signed   By: Kathreen Devoid M.D.   On: 06/27/2021 14:09   EEG adult  Result Date: 06/27/2021 Lora Havens, MD     06/27/2021  1:02 PM Patient Name: MATHILDE MCWHERTER MRN: [id number] Epilepsy Attending:  Lora Havens Referring Physician/Provider: Dr Kerney Elbe Date: 06/27/2021 Duration: 36.34 mins Patient history: 75 year old female with chronic left sided weakness secondary to prior stroke, presenting as a Code Stroke with acute onset of mutism, left facial droop and left sided drooling.  EEG to evaluate for seizure. Level of alertness: Awake AEDs during EEG study: Technical aspects: This EEG study was done with scalp electrodes positioned according to the 10-20 International system of electrode placement. Electrical activity was acquired at a sampling rate of 500Hz  and reviewed with a high frequency filter of 70Hz  and a low frequency filter of 1Hz . EEG data were recorded continuously and digitally stored. Description: The posterior dominant rhythm consists of 8 Hz activity of moderate voltage (25-35 uV) seen predominantly in posterior head regions, symmetric and reactive to eye opening and eye closing. Drowsiness was characterized by attenuation of the posterior background rhythm. EEG showed intermittent generalized and maximal right frontal region 3 to 6 Hz theta-delta slowing.  Spikes were also noted in the right hemisphere, maximal right frontal region which at times appeared quasiperiodic at 1 Hz. Hyperventilation and photic stimulation were not performed.   ABNORMALITY - Spike, right hemisphere, maximal right frontal region - Intermittent slow, generalized and maximal right frontal region IMPRESSION: This study showed evidence of epileptogenicity and cortical dysfunction arising from right hemisphere, maximal right frontal region likely secondary to underlying structural abnormality and high risk for seizure recurrence . Additionally EEG suggestive of mild diffuse encephalopathy, nonspecific etiology.  No seizures were seen throughout the recording. Dr Cheral Marker was notified Lora Havens   ECHOCARDIOGRAM COMPLETE  Result Date: 06/28/2021    ECHOCARDIOGRAM REPORT   Patient Name:   ARBELL WYCOFF [facility]  Date of Exam: 06/28/2021 Medical Rec #:  [id number]       Height:       62.0 in Accession #:    [id number]      Weight:       142.2 lb Date of Birth:  [birth year]        BSA:          1.654 m Patient Age:    75 years        BP:           162/72 mmHg Patient Gender: F               HR:           72 bpm. Exam Location:  Inpatient Procedure: 2D Echo, Cardiac Doppler and Color Doppler Indications:    TIA  History:        Patient has prior history of Echocardiogram examinations, most                 recent 12/18/2018. COPD and Stroke, Signs/Symptoms:Altered Mental                 Status; Risk Factors:Diabetes, Dyslipidemia and Hypertension.                 Tobacco abuse.  Sonographer:    Wenda Low Referring Phys: [id number] [place] T  ZHANG IMPRESSIONS  1. Left ventricular ejection fraction, by estimation, is 60 to 65%. The left ventricle has normal function. The left ventricle has no regional wall motion abnormalities. Left ventricular diastolic parameters were normal.  2. Right ventricular systolic function is normal. The right ventricular size is normal.  3. The mitral valve is normal in structure. Trivial mitral valve regurgitation. No evidence of mitral stenosis.  4. The aortic valve is normal in structure. Aortic valve regurgitation is not visualized. No aortic stenosis is present. FINDINGS  Left Ventricle: Left ventricular ejection fraction, by estimation, is 60 to 65%. The left ventricle has normal function. The left ventricle has no regional wall motion abnormalities. The left ventricular internal cavity size was normal in size. There is  no left ventricular hypertrophy. Left ventricular diastolic parameters were normal. Right Ventricle: The right ventricular size is normal. No increase in right ventricular wall thickness. Right ventricular systolic function is normal. Left Atrium: Left atrial size was normal in size. Right Atrium: Right atrial size was normal in size. Pericardium: There is no evidence of pericardial  effusion. Mitral Valve: The mitral valve is normal in structure. Trivial mitral valve regurgitation. No evidence of mitral valve stenosis. MV peak gradient, 3.2 mmHg. The mean mitral valve gradient is 1.0 mmHg. Tricuspid Valve: The tricuspid valve is normal in structure. Tricuspid valve regurgitation is trivial. Aortic Valve: The aortic valve is normal in structure. Aortic valve regurgitation is not visualized. No aortic stenosis is present. Aortic valve mean gradient measures 6.0 mmHg. Aortic valve peak gradient measures 10.1 mmHg. Aortic valve area, by VTI measures 1.97 cm. Pulmonic Valve: The pulmonic valve was normal in structure. Pulmonic valve regurgitation is not visualized. Aorta: The aortic root and ascending aorta are structurally normal, with no evidence of dilitation. IAS/Shunts: The atrial septum is grossly normal.  LEFT VENTRICLE PLAX 2D LVIDd:         4.30 cm     Diastology LVIDs:         2.60 cm     LV e' medial:    5.70 cm/s LV PW:         1.10 cm     LV E/e' medial:  10.8 LV IVS:        1.10 cm     LV e' lateral:   6.45 cm/s LVOT diam:     2.00 cm     LV E/e' lateral: 9.6 LV SV:         75 LV SV Index:   46 LVOT Area:     3.14 cm  LV Volumes (MOD) LV vol d, MOD A2C: 47.2 ml LV vol d, MOD A4C: 51.8 ml LV vol s, MOD A2C: 30.1 ml LV vol s, MOD A4C: 20.6 ml LV SV MOD A2C:     17.1 ml LV SV MOD A4C:     51.8 ml LV SV MOD BP:      27.6 ml RIGHT VENTRICLE RV Basal diam:  2.85 cm RV Mid diam:    2.60 cm RV S prime:     13.10 cm/s TAPSE (M-mode): 1.9 cm LEFT ATRIUM             Index        RIGHT ATRIUM           Index LA diam:        3.50 cm 2.12 cm/m   RA Area:     11.10 cm LA Vol (A2C):   36.3 ml 21.95 ml/m  RA Volume:   23.30 ml  14.09 ml/m LA Vol (A4C):   28.3 ml 17.11 ml/m LA Biplane Vol: 34.4 ml 20.80 ml/m  AORTIC VALVE                     PULMONIC VALVE AV Area (Vmax):    2.05 cm      PV Vmax:       0.79 m/s AV Area (Vmean):   1.85 cm      PV Peak grad:  2.5 mmHg AV Area (VTI):     1.97 cm  AV Vmax:           159.00 cm/s AV Vmean:          114.000 cm/s AV VTI:            0.382 m AV Peak Grad:      10.1 mmHg AV Mean Grad:      6.0 mmHg LVOT Vmax:         104.00 cm/s LVOT Vmean:        67.000 cm/s LVOT VTI:          0.240 m LVOT/AV VTI ratio: 0.63  AORTA Ao Root diam: 2.50 cm MITRAL VALVE MV Area (PHT): 3.08 cm    SHUNTS MV Area VTI:   2.67 cm    Systemic VTI:  0.24 m MV Peak grad:  3.2 mmHg    Systemic Diam: 2.00 cm MV Mean grad:  1.0 mmHg MV Vmax:       0.89 m/s MV Vmean:      45.8 cm/s MV Decel Time: 246 msec MV E velocity: 61.80 cm/s MV A velocity: 93.30 cm/s MV E/A ratio:  0.66 Mertie Moores MD Electronically signed by Mertie Moores MD Signature Date/Time: 06/28/2021/2:20:14 PM    Final    CT HEAD CODE STROKE WO CONTRAST  Result Date: 06/27/2021 CLINICAL DATA:  Code stroke. Neuro deficit, acute, stroke suspected. Additional history provided: Left-sided deficit, slurred speech, nonambulatory. EXAM: CT HEAD WITHOUT CONTRAST TECHNIQUE: Contiguous axial images were obtained from the base of the skull through the vertex without intravenous contrast. COMPARISON:  Head CT 01/30/2021 and earlier. Brain MRI 01/03/2019. MRA head 01/14/2019. FINDINGS: Brain: Mild generalized cerebral and cerebellar atrophy. Redemonstrated chronic right ACA territory cortical/subcortical infarct within the medial right frontal lobe. Redemonstrated chronic infarcts within the bilateral cerebral hemispheric white matter, deep gray nuclei, posterior limb of right internal capsule and right midbrain. Background advanced patchy and confluent hypoattenuation within the cerebral white matter, nonspecific but compatible with chronic small vessel ischemic disease. Redemonstrated small chronic infarcts within the bilateral cerebellar hemispheres. There is no acute intracranial hemorrhage. No acute demarcated cortical infarct is identified. No extra-axial fluid collection. No evidence of an intracranial mass. No midline shift.  Vascular: No hyperdense vessel.  Atherosclerotic calcifications. Skull: Normal. Negative for fracture or focal lesion. Sinuses/Orbits: Visualized orbits show no acute finding. mild mucosal thickening within the bilateral ethmoid and left maxillary sinuses at the imaged levels. ASPECTS [facility] Stroke Program Early CT Score) - Ganglionic level infarction (caudate, lentiform nuclei, internal capsule, insula, M1-M3 cortex): 7 - Supraganglionic infarction (M4-M6 cortex): 3 Total score (0-10 with 10 being normal): 10 These results were communicated to Dr. Cheral Marker at 10:14amon 12/2/2022by text page via the [facility] messaging system. IMPRESSION: Motion degraded exam. No acute intracranial abnormality is identified.  ASPECTS is 10. Redemonstrated parenchymal atrophy, chronic ischemic changes and chronic infarcts, as detailed. Electronically Signed   By: Kellie Simmering D.O.  On: 06/27/2021 10:17   CT ANGIO HEAD NECK W WO CM (CODE STROKE)  Addendum Date: 06/27/2021   ADDENDUM REPORT: 06/27/2021 19:29 ADDENDUM: CTA neck impression #4 will be called to the hospitalist provider by the Radiologist Assistant, and communication documented in the PACS or [employer]. Electronically Signed   By: Kellie Simmering D.O.   On: 06/27/2021 19:29   Result Date: 06/27/2021 CLINICAL DATA:  Neuro deficit, acute, stroke suspected. Aside deficit, slurred speech, nonambulatory. EXAM: CT ANGIOGRAPHY HEAD AND NECK TECHNIQUE: Multidetector CT imaging of the head and neck was performed using the standard protocol during bolus administration of intravenous contrast. Multiplanar CT image reconstructions and MIPs were obtained to evaluate the vascular anatomy. Carotid stenosis measurements (when applicable) are obtained utilizing NASCET criteria, using the distal internal carotid diameter as the denominator. CONTRAST:  63mL OMNIPAQUE IOHEXOL 350 MG/ML SOLN COMPARISON:  Noncontrast head CT performed earlier today 06/27/2021. MRA head 01/14/2019. CT  angiogram head/neck 12/18/2018. Chest CT 02/15/2018 FINDINGS: CTA NECK FINDINGS Aortic arch: Common origin of the innominate and left common carotid arteries. Atherosclerotic plaque within the visualized aortic arch and proximal major branch vessels of the neck. Streak and beam hardening artifact arising from a dense right-sided contrast bolus partially obscures the right subclavian artery. Within this is a shin, no hemodynamically significant stenosis is identified within the innominate or proximal right subclavian artery. Atherosclerotic plaque within the left subclavian artery results in up to 50% stenosis, unchanged. Right carotid system: CCA and ICA patent within the neck. Prominent soft and calcified plaque within the carotid bifurcation and proximal ICA. Resultant severe stenosis at the origin of the right ICA estimated at 80%, slightly progressed as compared to the prior examination of 12/18/2018. Distal to this, the ICA is patent within the neck without stenosis. Left carotid system: CCA and ICA patent within the neck. Scattered soft and calcified plaque within the left carotid system. Most notably, there is prominent soft calcified plaque within the carotid bifurcation and proximal ICA. Estimated stenosis at the origin of the left ICA of 90%, progressed. Distal to this, the ICA is patent within the neck without stenosis. Vertebral arteries: The dominant right vertebral artery is patent within the neck. Unchanged mild atherosclerotic narrowing at the origin of this vessel. As before, the non dominant left vertebral artery is occluded within the neck to the C5 level. Distal to this, there is irregular and faint opacification of the left vertebral artery, likely due to retrograde flow. Skeleton: Cervical spondylosis. No acute bony abnormality or aggressive osseous lesion. Other neck: Subcentimeter nodules within the thyroid gland, not meeting consensus criteria for ultrasound follow-up based on size. No neck  mass or cervical lymphadenopathy. Upper chest: Redemonstrated findings of fibrotic lung disease. New from prior examinations, there is an irregular opacity within the right upper lobe measuring 2.9 cm. Also new from prior exams, and irregular opacity is present within the left upper lobe measuring up to 4.5 cm in greatest dimension. These findings may be infectious/inflammatory, but are suspicious for possible neoplasm. Review of the MIP images confirms the above findings CTA HEAD FINDINGS Anterior circulation: The intracranial internal carotid arteries are patent. Calcified plaque within both vessels. Up to moderate stenosis within the cavernous right ICA. Moderate/severe stenosis within the distal petrous/proximal cavernous left ICA, progressed. The M1 middle cerebral arteries are patent. Atherosclerotic irregularity of the M2 and more distal middle cerebral artery vessels, bilaterally, most notably as follows. Redemonstrated severe stenosis within a mid to distal M2 right MCA vessel (  series 10, image 17). Redemonstrated moderate stenosis within a superior division proximal M2 left MCA vessel. Sites of progressive severe stenosis within a distal M2 left MCA vessel. The anterior cerebral arteries are patent. Redemonstrated mild to moderate stenosis within the right A1 segment. No intracranial aneurysm is identified. Posterior circulation: The dominant intracranial right vertebral artery is patent. Mild nonstenotic calcified plaque within this vessel. The intracranial left vertebral artery is developmentally diminutive with superimposed foci of high-grade stenosis. The basilar artery is patent. The posterior cerebral arteries are patent. Atherosclerotic irregularity of both vessels. Redemonstrated moderate stenosis within the P2 right PCA. Progressive severe stenosis within the right PCA P3 segment. Redemonstrated moderate stenosis within the P2 left PCA. Posterior communicating arteries are diminutive or absent  bilaterally. Venous sinuses: Within the limitations of contrast timing, no convincing thrombus. Anatomic variants: As described. Review of the MIP images confirms the above findings No emergent large vessel occlusion. These results were communicated to Dr. Cheral Marker At 10:59 amon 12/2/2022by text page via the [facility] messaging system. IMPRESSION: CTA neck: 1. The common carotid and internal carotid arteries are patent within the neck. Atherosclerotic stenosis at the origin of the right ICA estimated at 80%, progressed from the prior examination of 12/17/2020. Atherosclerotic stenosis at the origin of the left ICA estimated at 90%, also progressed. 2. The dominant right vertebral artery is patent within the neck with unchanged mild atherosclerotic narrowing at the origin of this vessel. 3. 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 Distal to this, there is irregular and faint opacification of the left vertebral artery, likely due to retrograde flow. 4. Irregular opacities within the bilateral upper lobes measuring up to 4.5 cm, new from prior examinations. While these findings may be infectious/inflammatory in etiology, they are suspicious for possible pulmonary neoplasm. A dedicated contrast-enhanced chest CT is recommended for initial further evaluation. CTA head: 1. No intracranial large vessel occlusion is identified. 2. Intracranial atherosclerotic disease with multifocal stenoses, most notably as follows. 3. Redemonstrated sites of up to moderate stenosis within the cavernous right ICA. 4. Moderate/severe stenosis within the distal petrous/proximal cavernous left ICA, progressed. 5. Redemonstrated severe stenosis within a mid to distal right M2 MCA vessel. 6. Redemonstrated moderate stenosis within a superior division proximal M2 left MCA vessel. 7. Sites of progressive severe stenosis within a distal M2 left MCA vessel. 8. Redemonstrated sites of severe stenosis within the V4 left  vertebral artery. 9. Redemonstrated moderate stenosis within the P2 right PCA. 10. Progressive severe stenosis within the P3 right PCA. 11. Redemonstrated moderate stenosis within the P2 left PCA. Electronically Signed: By: Kellie Simmering D.O. On: 06/27/2021 10:59     Subjective: No acute issues or events overnight denies nausea vomiting diarrhea constipation headache fevers chills chest pain   Discharge Exam: Vitals:   06/30/21 0956 06/30/21 1104  BP: (!) 145/62 134/68  Pulse: 65 76  Resp: 16 15  Temp: 98.9 F (37.2 C) 98.7 F (37.1 C)  SpO2: 93% 93%   Vitals:   06/30/21 0024 06/30/21 0332 06/30/21 0956 06/30/21 1104  BP: (!) 121/52 136/69 (!) 145/62 134/68  Pulse: 66 65 65 76  Resp:   16 15  Temp: 98 F (36.7 C) 98.7 F (37.1 C) 98.9 F (37.2 C) 98.7 F (37.1 C)  TempSrc: Oral Axillary Oral Oral  SpO2: 99% 98% 93% 93%  Weight:      Height:        General: Pt is alert, awake, not  in acute distress Cardiovascular: RRR, S1/S2 +, no rubs, no gallops Respiratory: CTA bilaterally, no wheezing, no rhonchi Abdominal: Soft, NT, ND, bowel sounds + Extremities: no edema, no cyanosis    The results of significant diagnostics from this hospitalization (including imaging, microbiology, ancillary and laboratory) are listed below for reference.     Microbiology: Recent Results (from the past 240 hour(s))  Blood culture (routine x 2)     Status: None (Preliminary result)   Collection Time: 06/27/21 10:51 AM   Specimen: BLOOD RIGHT WRIST  Result Value Ref Range Status   Specimen Description BLOOD RIGHT WRIST  Final   Special Requests   Final    BOTTLES DRAWN AEROBIC AND ANAEROBIC Blood Culture results may not be optimal due to an inadequate volume of blood received in culture bottles   Culture   Final    NO GROWTH 3 DAYS Performed at [facility], [place] [address] [place], [place] [address]    Report Status PENDING  Incomplete  Resp Panel by RT-PCR (Flu A&B, Covid)  Nasopharyngeal Swab     Status: Abnormal   Collection Time: 06/27/21 11:07 AM   Specimen: Nasopharyngeal Swab; Nasopharyngeal(NP) swabs in vial transport medium  Result Value Ref Range Status   SARS Coronavirus 2 by RT PCR NEGATIVE NEGATIVE Final    Comment: (NOTE) SARS-CoV-2 target nucleic acids are NOT DETECTED.  The SARS-CoV-2 RNA is generally detectable in upper respiratory specimens during the acute phase of infection. The lowest concentration of SARS-CoV-2 viral copies this assay can detect is 138 copies/mL. A negative result does not preclude SARS-Cov-2 infection and should not be used as the sole basis for treatment or other patient management decisions. A negative result may occur with  improper specimen collection/handling, submission of specimen other than nasopharyngeal swab, presence of viral mutation(s) within the areas targeted by this assay, and inadequate number of viral copies(<138 copies/mL). A negative result must be combined with clinical observations, patient history, and epidemiological information. The expected result is Negative.  Fact Sheet for Patients:  EntrepreneurPulse.com.au  Fact Sheet for Healthcare Providers:  IncredibleEmployment.be  This test is no t yet approved or cleared by the [place] FDA and  has been authorized for detection and/or diagnosis of SARS-CoV-2 by FDA under an Emergency Use Authorization (EUA). This EUA will remain  in effect (meaning this test can be used) for the duration of the COVID-19 declaration under Section 564(b)(1) of the Act, 21 U.S.C.section 360bbb-3(b)(1), unless the authorization is terminated  or revoked sooner.       Influenza A by PCR POSITIVE (A) NEGATIVE Final   Influenza B by PCR NEGATIVE NEGATIVE Final    Comment: (NOTE) The Xpert Xpress SARS-CoV-2/FLU/RSV plus assay is intended as an aid in the diagnosis of influenza from Nasopharyngeal swab specimens  and should not be used as a sole basis for treatment. Nasal washings and aspirates are unacceptable for Xpert Xpress SARS-CoV-2/FLU/RSV testing.  Fact Sheet for Patients: EntrepreneurPulse.com.au  Fact Sheet for Healthcare Providers: IncredibleEmployment.be  This test is not yet approved or cleared by the [place] FDA and has been authorized for detection and/or diagnosis of SARS-CoV-2 by FDA under an Emergency Use Authorization (EUA). This EUA will remain in effect (meaning this test can be used) for the duration of the COVID-19 declaration under Section 564(b)(1) of the Act, 21 U.S.C. section 360bbb-3(b)(1), unless the authorization is terminated or revoked.  Performed at [facility], [place] [address], [place], [place] [address]  Labs: BNP (last 3 results) Recent Labs    08/26/20 2037  BNP 50.3   Basic Metabolic Panel: Recent Labs  Lab 06/27/21 0951 06/27/21 2116 06/29/21 0453  NA 140 135 136  K 4.2 3.6 3.4*  CL 102 103 103  CO2  --  23 26  GLUCOSE 147* 200* 115*  BUN 23 17 17   CREATININE 1.10* 0.96 0.92  CALCIUM  --  8.0* 8.2*   Liver Function Tests: Recent Labs  Lab 06/27/21 2116  AST 37  ALT 28  ALKPHOS 73  BILITOT 0.6  PROT 6.2*  ALBUMIN 2.5*   No results for input(s): LIPASE, AMYLASE in the last 168 hours. No results for input(s): AMMONIA in the last 168 hours. CBC: Recent Labs  Lab 06/27/21 0951 06/27/21 1050 06/29/21 0453  WBC  --  8.9 6.2  NEUTROABS  --  5.8  --   HGB 14.6 13.2 10.6*  HCT 43.0 41.9 32.1*  MCV  --  82.2 78.7*  PLT  --  210 179   Cardiac Enzymes: No results for input(s): CKTOTAL, CKMB, CKMBINDEX, TROPONINI in the last 168 hours. BNP: Invalid input(s): POCBNP CBG: Recent Labs  Lab 06/29/21 1154 06/29/21 1654 06/29/21 2109 06/30/21 0641 06/30/21 1107  GLUCAP 146* 100* 110* 95 102*   D-Dimer No results for input(s): DDIMER in the last 72 hours. Hgb A1c Recent  Labs    06/27/21 2116 06/28/21 0107  HGBA1C 8.7* 8.8*   Lipid Profile Recent Labs    06/28/21 0107  CHOL 84  HDL 31*  LDLCALC 46  TRIG 33  CHOLHDL 2.7   Thyroid function studies No results for input(s): TSH, T4TOTAL, T3FREE, THYROIDAB in the last 72 hours.  Invalid input(s): FREET3 Anemia work up No results for input(s): VITAMINB12, FOLATE, FERRITIN, TIBC, IRON, RETICCTPCT in the last 72 hours. Urinalysis    Component Value Date/Time   COLORURINE STRAW (A) 01/30/2021 1908   APPEARANCEUR CLEAR 01/30/2021 1908   LABSPEC 1.024 01/30/2021 1908   PHURINE 6.0 01/30/2021 1908   GLUCOSEU >=500 (A) 01/30/2021 1908   HGBUR NEGATIVE 01/30/2021 1908   BILIRUBINUR NEGATIVE 01/30/2021 1908   KETONESUR NEGATIVE 01/30/2021 1908   PROTEINUR NEGATIVE 01/30/2021 1908   NITRITE NEGATIVE 01/30/2021 1908   LEUKOCYTESUR NEGATIVE 01/30/2021 1908   Sepsis Labs Invalid input(s): PROCALCITONIN,  WBC,  LACTICIDVEN Microbiology Recent Results (from the past 240 hour(s))  Blood culture (routine x 2)     Status: None (Preliminary result)   Collection Time: 06/27/21 10:51 AM   Specimen: BLOOD RIGHT WRIST  Result Value Ref Range Status   Specimen Description BLOOD RIGHT WRIST  Final   Special Requests   Final    BOTTLES DRAWN AEROBIC AND ANAEROBIC Blood Culture results may not be optimal due to an inadequate volume of blood received in culture bottles   Culture   Final    NO GROWTH 3 DAYS Performed at [facility], [place] [address] [place], [place] [address]    Report Status PENDING  Incomplete  Resp Panel by RT-PCR (Flu A&B, Covid) Nasopharyngeal Swab     Status: Abnormal   Collection Time: 06/27/21 11:07 AM   Specimen: Nasopharyngeal Swab; Nasopharyngeal(NP) swabs in vial transport medium  Result Value Ref Range Status   SARS Coronavirus 2 by RT PCR NEGATIVE NEGATIVE Final    Comment: (NOTE) SARS-CoV-2 target nucleic acids are NOT DETECTED.  The SARS-CoV-2 RNA is generally  detectable in upper respiratory specimens during the acute phase of infection. The  lowest concentration of SARS-CoV-2 viral copies this assay can detect is 138 copies/mL. A negative result does not preclude SARS-Cov-2 infection and should not be used as the sole basis for treatment or other patient management decisions. A negative result may occur with  improper specimen collection/handling, submission of specimen other than nasopharyngeal swab, presence of viral mutation(s) within the areas targeted by this assay, and inadequate number of viral copies(<138 copies/mL). A negative result must be combined with clinical observations, patient history, and epidemiological information. The expected result is Negative.  Fact Sheet for Patients:  EntrepreneurPulse.com.au  Fact Sheet for Healthcare Providers:  IncredibleEmployment.be  This test is no t yet approved or cleared by the [place] FDA and  has been authorized for detection and/or diagnosis of SARS-CoV-2 by FDA under an Emergency Use Authorization (EUA). This EUA will remain  in effect (meaning this test can be used) for the duration of the COVID-19 declaration under Section 564(b)(1) of the Act, 21 U.S.C.section 360bbb-3(b)(1), unless the authorization is terminated  or revoked sooner.       Influenza A by PCR POSITIVE (A) NEGATIVE Final   Influenza B by PCR NEGATIVE NEGATIVE Final    Comment: (NOTE) The Xpert Xpress SARS-CoV-2/FLU/RSV plus assay is intended as an aid in the diagnosis of influenza from Nasopharyngeal swab specimens and should not be used as a sole basis for treatment. Nasal washings and aspirates are unacceptable for Xpert Xpress SARS-CoV-2/FLU/RSV testing.  Fact Sheet for Patients: EntrepreneurPulse.com.au  Fact Sheet for Healthcare Providers: IncredibleEmployment.be  This test is not yet approved or cleared by the [place]  FDA and has been authorized for detection and/or diagnosis of SARS-CoV-2 by FDA under an Emergency Use Authorization (EUA). This EUA will remain in effect (meaning this test can be used) for the duration of the COVID-19 declaration under Section 564(b)(1) of the Act, 21 U.S.C. section 360bbb-3(b)(1), unless the authorization is terminated or revoked.  Performed at [facility], [place] [address] [place], [place] [address]      Time coordinating discharge: Over 30 minutes  SIGNED:   Little Ishikawa, DO [facility] 06/30/2021, 12:56 PM Pager   If 7PM-7AM, please contact night-coverage [email or web address]

## 2021-07-01 DIAGNOSIS — J449 Chronic obstructive pulmonary disease, unspecified: Secondary | ICD-10-CM | POA: Diagnosis not present

## 2021-07-01 DIAGNOSIS — R2681 Unsteadiness on feet: Secondary | ICD-10-CM | POA: Diagnosis not present

## 2021-07-01 DIAGNOSIS — E11622 Type 2 diabetes mellitus with other skin ulcer: Secondary | ICD-10-CM | POA: Diagnosis not present

## 2021-07-01 DIAGNOSIS — R531 Weakness: Secondary | ICD-10-CM | POA: Diagnosis not present

## 2021-07-01 DIAGNOSIS — Z794 Long term (current) use of insulin: Secondary | ICD-10-CM | POA: Diagnosis not present

## 2021-07-01 DIAGNOSIS — J9611 Chronic respiratory failure with hypoxia: Secondary | ICD-10-CM | POA: Diagnosis not present

## 2021-07-01 DIAGNOSIS — E1151 Type 2 diabetes mellitus with diabetic peripheral angiopathy without gangrene: Secondary | ICD-10-CM | POA: Diagnosis not present

## 2021-07-01 DIAGNOSIS — L97328 Non-pressure chronic ulcer of left ankle with other specified severity: Secondary | ICD-10-CM | POA: Diagnosis not present

## 2021-07-01 DIAGNOSIS — L89893 Pressure ulcer of other site, stage 3: Secondary | ICD-10-CM | POA: Diagnosis not present

## 2021-07-01 DIAGNOSIS — I739 Peripheral vascular disease, unspecified: Secondary | ICD-10-CM | POA: Diagnosis not present

## 2021-07-02 LAB — CULTURE, BLOOD (ROUTINE X 2): Culture: NO GROWTH

## 2021-07-03 DIAGNOSIS — J449 Chronic obstructive pulmonary disease, unspecified: Secondary | ICD-10-CM | POA: Diagnosis not present

## 2021-07-03 DIAGNOSIS — J9611 Chronic respiratory failure with hypoxia: Secondary | ICD-10-CM | POA: Diagnosis not present

## 2021-07-03 DIAGNOSIS — L97328 Non-pressure chronic ulcer of left ankle with other specified severity: Secondary | ICD-10-CM | POA: Diagnosis not present

## 2021-07-03 DIAGNOSIS — E11622 Type 2 diabetes mellitus with other skin ulcer: Secondary | ICD-10-CM | POA: Diagnosis not present

## 2021-07-03 DIAGNOSIS — I739 Peripheral vascular disease, unspecified: Secondary | ICD-10-CM | POA: Diagnosis not present

## 2021-07-03 DIAGNOSIS — R2681 Unsteadiness on feet: Secondary | ICD-10-CM | POA: Diagnosis not present

## 2021-07-03 DIAGNOSIS — L89893 Pressure ulcer of other site, stage 3: Secondary | ICD-10-CM | POA: Diagnosis not present

## 2021-07-03 DIAGNOSIS — R531 Weakness: Secondary | ICD-10-CM | POA: Diagnosis not present

## 2021-07-03 DIAGNOSIS — Z794 Long term (current) use of insulin: Secondary | ICD-10-CM | POA: Diagnosis not present

## 2021-07-03 DIAGNOSIS — E1151 Type 2 diabetes mellitus with diabetic peripheral angiopathy without gangrene: Secondary | ICD-10-CM | POA: Diagnosis not present

## 2021-07-04 ENCOUNTER — Encounter (HOSPITAL_BASED_OUTPATIENT_CLINIC_OR_DEPARTMENT_OTHER): Payer: Medicare Other | Admitting: Internal Medicine

## 2021-07-04 ENCOUNTER — Other Ambulatory Visit: Payer: Self-pay

## 2021-07-04 DIAGNOSIS — E1151 Type 2 diabetes mellitus with diabetic peripheral angiopathy without gangrene: Secondary | ICD-10-CM | POA: Diagnosis not present

## 2021-07-04 DIAGNOSIS — F0150 Vascular dementia without behavioral disturbance: Secondary | ICD-10-CM | POA: Diagnosis not present

## 2021-07-04 DIAGNOSIS — E11621 Type 2 diabetes mellitus with foot ulcer: Secondary | ICD-10-CM | POA: Diagnosis not present

## 2021-07-04 DIAGNOSIS — I69354 Hemiplegia and hemiparesis following cerebral infarction affecting left non-dominant side: Secondary | ICD-10-CM | POA: Insufficient documentation

## 2021-07-04 DIAGNOSIS — Z794 Long term (current) use of insulin: Secondary | ICD-10-CM | POA: Insufficient documentation

## 2021-07-04 DIAGNOSIS — L89893 Pressure ulcer of other site, stage 3: Secondary | ICD-10-CM

## 2021-07-04 DIAGNOSIS — E119 Type 2 diabetes mellitus without complications: Secondary | ICD-10-CM | POA: Diagnosis not present

## 2021-07-04 DIAGNOSIS — S91302A Unspecified open wound, left foot, initial encounter: Secondary | ICD-10-CM

## 2021-07-04 NOTE — Progress Notes (Signed)
MARGUETTA, WINDISH ([id number]) Visit Report for 07/04/2021 Arrival Information Details Patient Name: Date of Service: Crystal Moore, Crystal Moore. 07/04/2021 10:00 A M Medical Record Number: [id number] Patient Account Number: [id number] Date of Birth/Sex: Treating RN: 1946/06/02 (75 y.o. [name], [name] Primary Care Shawnna Pancake: [name] Other Clinician: Referring Odean Mcelwain: Treating Lailani Tool/Extender: [name] Weeks in Treatment: 1 Visit Information History Since Last Visit Added or deleted any medications: No Patient Arrived: Wheel Chair Any new allergies or adverse reactions: No Arrival Time: 10:35 Had a fall or experienced change in No Accompanied By: family member activities of daily living that may affect Transfer Assistance: Manual risk of falls: Patient Identification Verified: Yes Signs or symptoms of abuse/neglect since last visito No Secondary Verification Process Completed: Yes Hospitalized since last visit: No Patient Requires Transmission-Based Precautions: No Implantable device outside of the clinic excluding No Patient Has Alerts: Yes cellular tissue based products placed in the center Patient Alerts: Patient on Blood Thinner since last visit: L ABI=.96 10/03/20 Has Dressing in Place as Prescribed: Yes Pain Present Now: No Electronic Signature(s) Signed: 07/04/2021 12:23:12 PM By: Deon Pilling RN, BSN Entered By: Deon Pilling on 07/04/2021 10:47:00 -------------------------------------------------------------------------------- Encounter Discharge Information Details Patient Name: Date of Service: Crystal Grammes M. 07/04/2021 10:00 A M Medical Record Number: [id number] Patient Account Number: [id number] Date of Birth/Sex: Treating RN: 27-Feb-1946 (75 y.o. [name], [name] Primary Care Ilina Xu: [name] Other Clinician: Referring Leslie Jester: Treating Bentlie Catanzaro/Extender: [name] Weeks  in Treatment: 1 Encounter Discharge Information Items Post Procedure Vitals Discharge Condition: Stable Temperature (F): 97.4 Ambulatory Status: Wheelchair Pulse (bpm): 74 Discharge Destination: Home Respiratory Rate (breaths/min): 17 Transportation: Private Auto Blood Pressure (mmHg): 134/74 Accompanied By: self Schedule Follow-up Appointment: Yes Clinical Summary of Care: Patient Declined Electronic Signature(s) Signed: 07/04/2021 11:46:22 AM By: Rhae Hammock RN Entered By: Rhae Hammock on 07/04/2021 11:32:27 -------------------------------------------------------------------------------- Lower Extremity Assessment Details Patient Name: Date of Service: Crystal Moore. 07/04/2021 10:00 A M Medical Record Number: [id number] Patient Account Number: [id number] Date of Birth/Sex: Treating RN: 1946/07/22 (75 y.o. Debby Bud Primary Care Maryjo Ragon: [name] Other Clinician: Referring Brinley Treanor: Treating Dashiell Franchino/Extender: [name] Weeks in Treatment: 1 Edema Assessment Assessed: [Left: Yes] [Right: No] Edema: [Left: Ye] [Right: s] Calf Left: Right: Point of Measurement: 29 cm From Medial Instep 29.5 cm Ankle Left: Right: Point of Measurement: 7 cm From Medial Instep 20 cm Vascular Assessment Pulses: Dorsalis Pedis Palpable: [Left:Yes] Electronic Signature(s) Signed: 07/04/2021 12:23:12 PM By: Deon Pilling RN, BSN Entered By: Deon Pilling on 07/04/2021 10:47:45 -------------------------------------------------------------------------------- Multi Wound Chart Details Patient Name: Date of Service: Crystal Grammes M. 07/04/2021 10:00 [place] Record Number: [id number] Patient Account Number: [id number] Date of Birth/Sex: Treating RN: 10/08/1945 (75 y.o. F) Primary Care Fredrick Dray: [name] Other Clinician: Referring Klover Priestly: Treating Beck Cofer/Extender: [place] Bells,  Rupashree Weeks in Treatment: 1 Vital Signs Height(in): Pulse(bpm): 65 Weight(lbs): Blood Pressure(mmHg): 173/83 Body Mass Index(BMI): Temperature(F): 97.5 Respiratory Rate(breaths/min): 20 Photos: [5:No Photos Left, Lateral Foot] [6:No Photos Left, Lateral Ankle] [N/A:N/A N/A] Wound Location: [5:Pressure Injury] [6:Pressure Injury] [N/A:N/A] Wounding Event: [5:Pressure Ulcer] [6:Pressure Ulcer] [N/A:N/A] Primary Etiology: [5:Cataracts, Chronic Obstructive] [6:Cataracts, Chronic Obstructive] [N/A:N/A] Comorbid History: [5:Pulmonary Disease (COPD), Hypertension, Type II Diabetes, Osteoarthritis, Seizure Disorder 06/05/2021] [6:Pulmonary Disease (COPD), Hypertension, Type II Diabetes, Osteoarthritis, Seizure Disorder 03/27/2020] [N/A:N/A] Date Acquired: [5:1] [6:1] [N/A:N/A] Weeks of Treatment: [5:Open] [6:Open] [N/A:N/A] Wound Status: [5:0.6x1.3x0.1] [6:1.6x0.8x0.3] [N/A:N/A] Measurements L x W x D (cm) [5:0.613] [6:1.005] [N/A:N/A]  A (cm) : rea [5:0.061] [6:0.302] [N/A:N/A] Volume (cm) : [5:48.00%] [6:20.00%] [N/A:N/A] % Reduction in A rea: [5:74.20%] [6:19.90%] [N/A:N/A] % Reduction in Volume: [5:Category/Stage III] [6:Category/Stage III] [N/A:N/A] Classification: [5:Medium] [6:Medium] [N/A:N/A] Exudate A mount: [5:Purulent] [6:Purulent] [N/A:N/A] Exudate Type: [5:yellow, brown, green] [6:yellow, brown, green] [N/A:N/A] Exudate Color: [5:Distinct, outline attached] [6:Distinct, outline attached] [N/A:N/A] Wound Margin: [5:Small (1-33%)] [6:Large (67-100%)] [N/A:N/A] Granulation A mount: [5:Red] [6:Red] [N/A:N/A] Granulation Quality: [5:Large (67-100%)] [6:Small (1-33%)] [N/A:N/A] Necrotic A mount: [5:Fat Layer (Subcutaneous Tissue): Yes Fat Layer (Subcutaneous Tissue): Yes N/A] Exposed Structures: [5:Fascia: No Tendon: No Muscle: No Joint: No Bone: No Small (1-33%)] [6:Fascia: No Tendon: No Muscle: No Joint: No Bone: No Small (1-33%)] [N/A:N/A] Epithelialization: [5:Debridement  - Selective/Open Wound Debridement - Selective/Open Wound N/A] Debridement: Pre-procedure Verification/Time Out 11:13 [6:11:13] [N/A:N/A] Taken: [5:Lidocaine] [6:Lidocaine] [N/A:N/A] Pain Control: [5:Slough] [6:Slough] [N/A:N/A] Tissue Debrided: [5:Non-Viable Tissue] [6:Non-Viable Tissue] [N/A:N/A] Level: [5:0.78] [6:1.28] [N/A:N/A] Debridement A (sq cm): [5:rea Curette] [6:Curette] [N/A:N/A] Instrument: [5:Minimum] [6:Minimum] [N/A:N/A] Bleeding: [5:Pressure] [6:Pressure] [N/A:N/A] Hemostasis A chieved: [5:0] [6:0] [N/A:N/A] Procedural Pain: [5:0] [6:0] [N/A:N/A] Post Procedural Pain: [5:Procedure was tolerated well] [6:Procedure was tolerated well] [N/A:N/A] Debridement Treatment Response: [5:0.6x1.3x0.1] [6:1.6x0.8x0.3] [N/A:N/A] Post Debridement Measurements L x W x D (cm) [5:0.061] [6:0.302] [N/A:N/A] Post Debridement Volume: (cm) [5:Category/Stage III] [6:Category/Stage III] [N/A:N/A] Post Debridement Stage: [5:Debridement] [6:Debridement] [N/A:N/A] Treatment Notes Electronic Signature(s) Signed: 07/04/2021 11:31:08 AM By: Kalman Shan DO Entered By: Kalman Shan on 07/04/2021 11:27:39 -------------------------------------------------------------------------------- Multi-Disciplinary Care Plan Details Patient Name: Date of Service: Crystal Grammes M. 07/04/2021 10:00 A M Medical Record Number: [id number] Patient Account Number: [id number] Date of Birth/Sex: Treating RN: 07/16/1946 (75 y.o. [name], [name] Primary Care Wilburn Keir: [name] Other Clinician: Referring Oryan Winterton: Treating Jason Frisbee/Extender: [name] Weeks in Treatment: 1 Active Inactive Pressure Nursing Diagnoses: Knowledge deficit related to management of pressures ulcers Goals: Patient will remain free of pressure ulcers Date Initiated: 06/26/2021 Target Resolution Date: 07/31/2021 Goal Status: Active Interventions: Assess: immobility, friction,  shearing, incontinence upon admission and as needed Assess offloading mechanisms upon admission and as needed Assess potential for pressure ulcer upon admission and as needed Treatment Activities: Patient referred for home evaluation of offloading devices/mattresses : 06/26/2021 Notes: Wound/Skin Impairment Nursing Diagnoses: Impaired tissue integrity Goals: Patient/caregiver will verbalize understanding of skin care regimen Date Initiated: 06/26/2021 Target Resolution Date: 07/31/2021 Goal Status: Active Ulcer/skin breakdown will have a volume reduction of 30% by week 4 Date Initiated: 06/26/2021 Target Resolution Date: 07/31/2021 Goal Status: Active Interventions: Assess patient/caregiver ability to obtain necessary supplies Assess patient/caregiver ability to perform ulcer/skin care regimen upon admission and as needed Assess ulceration(s) every visit Provide education on ulcer and skin care Treatment Activities: Topical wound management initiated : 06/26/2021 Notes: Electronic Signature(s) Signed: 07/04/2021 11:46:22 AM By: Rhae Hammock RN Entered By: Rhae Hammock on 07/04/2021 11:30:51 -------------------------------------------------------------------------------- Pain Assessment Details Patient Name: Date of Service: Crystal Moore. 07/04/2021 10:00 A M Medical Record Number: [id number] Patient Account Number: [id number] Date of Birth/Sex: Treating RN: 09-30-1945 (75 y.o. Debby Bud Primary Care Lindey Renzulli: [name] Other Clinician: Referring Disha Cottam: Treating Daxtyn Rottenberg/Extender: [name] Weeks in Treatment: 1 Active Problems Location of Pain Severity and Description of Pain Patient Has Paino No Site Locations Rate the pain. Current Pain Level: 0 Pain Management and Medication Current Pain Management: Medication: No Cold Application: No Rest: No Massage: No Activity: No T.E.N.S.: No Heat Application:  No Leg drop or elevation: No Is the Current Pain Management Adequate: Adequate How does  your wound impact your activities of daily livingo Sleep: No Bathing: No Appetite: No Relationship With Others: No Bladder Continence: No Emotions: No Bowel Continence: No Work: No Toileting: No Drive: No Dressing: No Hobbies: No Notes pain with cleansing of the wounds. Electronic Signature(s) Signed: 07/04/2021 12:23:12 PM By: Deon Pilling RN, BSN Entered By: Deon Pilling on 07/04/2021 10:47:35 -------------------------------------------------------------------------------- Patient/Caregiver Education Details Patient Name: Date of Service: Crystal Moore 12/9/2022andnbsp10:00 [place] Record Number: [id number] Patient Account Number: [id number] Date of Birth/Gender: Treating RN: 06-12-1946 (75 y.o. Benjaman Lobe Primary Care Physician: [name] Other Clinician: Referring Physician: Treating Physician/Extender: [name] in Treatment: 1 Education Assessment Education Provided To: Patient Education Topics Provided Wound/Skin Impairment: Methods: Explain/Verbal Responses: State content correctly Electronic Signature(s) Signed: 07/04/2021 11:46:22 AM By: Rhae Hammock RN Entered By: Rhae Hammock on 07/04/2021 11:31:11 -------------------------------------------------------------------------------- Wound Assessment Details Patient Name: Date of Service: Crystal Moore. 07/04/2021 10:00 A M Medical Record Number: [id number] Patient Account Number: [id number] Date of Birth/Sex: Treating RN: [birth year] (75 y.o. [name], [name] Primary Care Deeana Atwater: [name] Other Clinician: Referring Khy Pitre: Treating Anikin Prosser/Extender: [name] Weeks in Treatment: 1 Wound Status Wound Number: 5 Primary Pressure Ulcer Etiology: Wound Location: Left, Lateral Foot Wound  Open Wounding Event: Pressure Injury Status: Date Acquired: 06/05/2021 Comorbid Cataracts, Chronic Obstructive Pulmonary Disease (COPD), Weeks Of Treatment: 1 History: Hypertension, Type II Diabetes, Osteoarthritis, Seizure Disorder Clustered Wound: No Wound Measurements Length: (cm) 0.6 Width: (cm) 1.3 Depth: (cm) 0.1 Area: (cm) 0.613 Volume: (cm) 0.061 % Reduction in Area: 48% % Reduction in Volume: 74.2% Epithelialization: Small (1-33%) Tunneling: No Undermining: No Wound Description Classification: Category/Stage III Wound Margin: Distinct, outline attached Exudate Amount: Medium Exudate Type: Purulent Exudate Color: yellow, brown, green Foul Odor After Cleansing: No Slough/Fibrino Yes Wound Bed Granulation Amount: Small (1-33%) Exposed Structure Granulation Quality: Red Fascia Exposed: No Necrotic Amount: Large (67-100%) Fat Layer (Subcutaneous Tissue) Exposed: Yes Necrotic Quality: Adherent Slough Tendon Exposed: No Muscle Exposed: No Joint Exposed: No Bone Exposed: No Treatment Notes Wound #5 (Foot) Wound Laterality: Left, Lateral Cleanser Soap and Water Discharge Instruction: May shower and wash wound with dial antibacterial soap and water prior to dressing change. Wound Cleanser Discharge Instruction: Cleanse the wound with wound cleanser prior to applying a clean dressing using gauze sponges, not tissue or cotton balls. Peri-Wound Care Topical Primary Dressing Santyl Ointment Discharge Instruction: Apply nickel thick amount to wound bed as instructed Secondary Dressing Woven Gauze Sponges 2x2 in Discharge Instruction: Apply over primary dressing as directed. Zetuvit Plus Silicone Border Dressing 4x4 (in/in) Discharge Instruction: Apply silicone border over primary dressing as directed. Secured With Compression Wrap Compression Stockings Environmental education officer) Signed: 07/04/2021 12:23:12 PM By: Deon Pilling RN, BSN Entered By: Deon Pilling on 07/04/2021 10:48:50 -------------------------------------------------------------------------------- Wound Assessment Details Patient Name: Date of Service: Crystal Moore. 07/04/2021 10:00 A M Medical Record Number: [id number] Patient Account Number: [id number] Date of Birth/Sex: Treating RN: Sep 11, 1945 (75 y.o. [name], [name] Primary Care Yavuz Kirby: [name] Other Clinician: Referring Abrielle Finck: Treating Tabithia Stroder/Extender: [name] Weeks in Treatment: 1 Wound Status Wound Number: 6 Primary Pressure Ulcer Etiology: Wound Location: Left, Lateral Ankle Wound Open Wounding Event: Pressure Injury Status: Date Acquired: 03/27/2020 Comorbid Cataracts, Chronic Obstructive Pulmonary Disease (COPD), Weeks Of Treatment: 1 History: Hypertension, Type II Diabetes, Osteoarthritis, Seizure Disorder Clustered Wound: No Wound Measurements Length: (cm) 1.6 Width: (cm) 0.8 Depth: (cm) 0.3 Area: (cm) 1.005 Volume: (cm)  0.302 % Reduction in Area: 20% % Reduction in Volume: 19.9% Epithelialization: Small (1-33%) Tunneling: No Undermining: No Wound Description Classification: Category/Stage III Wound Margin: Distinct, outline attached Exudate Amount: Medium Exudate Type: Purulent Exudate Color: yellow, brown, green Foul Odor After Cleansing: No Slough/Fibrino Yes Wound Bed Granulation Amount: Large (67-100%) Exposed Structure Granulation Quality: Red Fascia Exposed: No Necrotic Amount: Small (1-33%) Fat Layer (Subcutaneous Tissue) Exposed: Yes Necrotic Quality: Adherent Slough Tendon Exposed: No Muscle Exposed: No Joint Exposed: No Bone Exposed: No Treatment Notes Wound #6 (Ankle) Wound Laterality: Left, Lateral Cleanser Soap and Water Discharge Instruction: May shower and wash wound with dial antibacterial soap and water prior to dressing change. Wound Cleanser Discharge Instruction: Cleanse the wound with wound cleanser  prior to applying a clean dressing using gauze sponges, not tissue or cotton balls. Peri-Wound Care Topical Primary Dressing Santyl Ointment Discharge Instruction: Apply nickel thick amount to wound bed as instructed Secondary Dressing Woven Gauze Sponges 2x2 in Discharge Instruction: Apply over primary dressing as directed. Zetuvit Plus Silicone Border Dressing 4x4 (in/in) Discharge Instruction: Apply silicone border over primary dressing as directed. Secured With Compression Wrap Compression Stockings Environmental education officer) Signed: 07/04/2021 12:23:12 PM By: Deon Pilling RN, BSN Entered By: Deon Pilling on 07/04/2021 10:49:33 -------------------------------------------------------------------------------- Vitals Details Patient Name: Date of Service: Crystal Grammes M. 07/04/2021 10:00 A M Medical Record Number: [id number] Patient Account Number: [id number] Date of Birth/Sex: Treating RN: 01/13/1946 (75 y.o. Debby Bud Primary Care Jamille Yoshino: [name] Other Clinician: Referring Gertude Benito: Treating Kierra Jezewski/Extender: [name] Weeks in Treatment: 1 Vital Signs Time Taken: 10:36 Temperature (F): 97.5 Pulse (bpm): 87 Respiratory Rate (breaths/min): 20 Blood Pressure (mmHg): 173/83 Reference Range: 80 - 120 mg / dl Electronic Signature(s) Signed: 07/04/2021 12:23:12 PM By: Deon Pilling RN, BSN Entered By: Deon Pilling on 07/04/2021 10:47:18

## 2021-07-04 NOTE — Progress Notes (Signed)
Crystal Moore, Crystal Moore ([id number]) Visit Report for 07/04/2021 Chief Complaint Document Details Patient Name: Date of Service: Crystal Moore, Crystal Moore. 07/04/2021 10:00 A M Medical Record Number: [id number] Patient Account Number: [id number] Date of Birth/Sex: Treating RN: 1945-09-22 (75 y.o. F) Primary Care Provider: [name] Other Clinician: Referring Provider: Treating Provider/Extender: [name] Weeks in Treatment: 1 Information Obtained from: Patient Chief Complaint 08/08/2020; patient is here accompanied by her grandson for review of wounds on the left lateral malleolus as well as an area on the right upper mid tibia 02/25/2021; patient is accompanied by daughter for review of her left lateral malleolus wound 06/26/2021; patient is accompanied by daughter for review of her left lateral malleolus wound and left lateral foot wound Electronic Signature(s) Signed: 07/04/2021 11:31:08 AM By: Kalman Shan DO Entered By: Kalman Shan on 07/04/2021 11:27:50 -------------------------------------------------------------------------------- Debridement Details Patient Name: Date of Service: Crystal Grammes M. 07/04/2021 10:00 A M Medical Record Number: [id number] Patient Account Number: [id number] Date of Birth/Sex: Treating RN: 1945-12-06 (75 y.o. [name], [name] Primary Care Provider: [name] Other Clinician: Referring Provider: Treating Provider/Extender: [name] Weeks in Treatment: 1 Debridement Performed for Assessment: Wound #5 Left,Lateral Foot Performed By: Physician Kalman Shan, DO Debridement Type: Debridement Level of Consciousness (Pre-procedure): Awake and Alert Pre-procedure Verification/Time Out Yes - 11:13 Taken: Start Time: 11:13 Pain Control: Lidocaine T Area Debrided (L x W): otal 0.6 (cm) x 1.3 (cm) = 0.78 (cm) Tissue and other material debrided: Viable, Non-Viable, Slough,  Slough Level: Non-Viable Tissue Debridement Description: Selective/Open Wound Instrument: Curette Bleeding: Minimum Hemostasis Achieved: Pressure End Time: 11:13 Procedural Pain: 0 Post Procedural Pain: 0 Response to Treatment: Procedure was tolerated well Level of Consciousness (Post- Awake and Alert procedure): Post Debridement Measurements of Total Wound Length: (cm) 0.6 Stage: Category/Stage III Width: (cm) 1.3 Depth: (cm) 0.1 Volume: (cm) 0.061 Character of Wound/Ulcer Post Debridement: Improved Post Procedure Diagnosis Same as Pre-procedure Electronic Signature(s) Signed: 07/04/2021 11:31:08 AM By: Kalman Shan DO Signed: 07/04/2021 11:46:22 AM By: Rhae Hammock RN Entered By: Rhae Hammock on 07/04/2021 11:14:16 -------------------------------------------------------------------------------- Debridement Details Patient Name: Date of Service: Crystal Grammes M. 07/04/2021 10:00 A M Medical Record Number: [id number] Patient Account Number: [id number] Date of Birth/Sex: Treating RN: March 19, 1946 (74 y.o. [name], [name] Primary Care Provider: [name] Other Clinician: Referring Provider: Treating Provider/Extender: [name] Weeks in Treatment: 1 Debridement Performed for Assessment: Wound #6 Left,Lateral Ankle Performed By: Physician Kalman Shan, DO Debridement Type: Debridement Level of Consciousness (Pre-procedure): Awake and Alert Pre-procedure Verification/Time Out Yes - 11:13 Taken: Start Time: 11:13 Pain Control: Lidocaine T Area Debrided (L x W): otal 1.6 (cm) x 0.8 (cm) = 1.28 (cm) Tissue and other material debrided: Viable, Non-Viable, Slough, Slough Level: Non-Viable Tissue Debridement Description: Selective/Open Wound Instrument: Curette Bleeding: Minimum Hemostasis Achieved: Pressure End Time: 11:13 Procedural Pain: 0 Post Procedural Pain: 0 Response to Treatment: Procedure was  tolerated well Level of Consciousness (Post- Awake and Alert procedure): Post Debridement Measurements of Total Wound Length: (cm) 1.6 Stage: Category/Stage III Width: (cm) 0.8 Depth: (cm) 0.3 Volume: (cm) 0.302 Character of Wound/Ulcer Post Debridement: Improved Post Procedure Diagnosis Same as Pre-procedure Electronic Signature(s) Signed: 07/04/2021 11:31:08 AM By: Kalman Shan DO Signed: 07/04/2021 11:46:22 AM By: Rhae Hammock RN Entered By: Rhae Hammock on 07/04/2021 11:14:56 -------------------------------------------------------------------------------- HPI Details Patient Name: Date of Service: Crystal Grammes M. 07/04/2021 10:00 A M Medical Record Number: [id number] Patient Account Number: [id number] Date of Birth/Sex: Treating RN:  01-18-1946 (75 y.o. F) Primary Care Provider: [name] Other Clinician: Referring Provider: Treating Provider/Extender: [name] Weeks in Treatment: 1 History of Present Illness HPI Description: Admission 02/25/2021 Ms. Crystal Moore is a 75 year old female with a past medical history of uncontrolled insulin-dependent type 2 diabetes, CVA with left hemiparesis, vascular dementia and peripheral vascular disease that presents to the clinic for worsening left lateral malleolus wound. She was treated in our clinic earlier this year for the wound. It was well-healing at that time however she has not followed up since March. She has noticed a decline in the past 1.5 months. She has been using silver alginate to the area. She currently denies signs of infection. She does report tenderness to the wound bed. 8/22; patient presents for follow-up. Daughter is not present today. Patient has no issues or complaints today. She states that her daughter is doing the dressing changes. She has not obtained the x-ray ordered at last clinic visit. She is currently not in pain. Readmission 06/26/2021 Patient was  last seen on 03/17/2021 for a left lateral malleolus wound. It is unclear why she has not followed up. She continues to have a left lateral malleolus wound and now has developed a left lateral foot wound. She has been using silver alginate to these areas. She currently denies signs of infection. She still has not obtained the x-ray ordered at last admission. 12/9/; patient presents for follow-up. She has not picked up Santyl from the pharmacy. She continues to use silver alginate to the 2 wound beds. She reports improvement in healing. Electronic Signature(s) Signed: 07/04/2021 11:31:08 AM By: Kalman Shan DO Entered By: Kalman Shan on 07/04/2021 11:28:15 -------------------------------------------------------------------------------- Physical Exam Details Patient Name: Date of Service: Crystal Lennox. 07/04/2021 10:00 A M Medical Record Number: [id number] Patient Account Number: [id number] Date of Birth/Sex: Treating RN: 1945-10-08 (75 y.o. F) Primary Care Provider: [name] Other Clinician: Referring Provider: Treating Provider/Extender: [name] Weeks in Treatment: 1 Constitutional respirations regular, non-labored and within target range for patient.. Cardiovascular 2+ dorsalis pedis/posterior tibialis pulses. Psychiatric pleasant and cooperative. Notes Left foot: T the lateral malleolus there is an open wound with granulation tissue and nonviable tissue present. T the lateral aspect there is an open wound with o o Granulation tissue and nonviable tissue. No obvious signs of infection on exam. Electronic Signature(s) Signed: 07/04/2021 11:31:08 AM By: Kalman Shan DO Entered By: Kalman Shan on 07/04/2021 11:28:56 -------------------------------------------------------------------------------- Physician Orders Details Patient Name: Date of Service: Crystal Lennox. 07/04/2021 10:00 A M Medical Record Number:  [id number] Patient Account Number: [id number] Date of Birth/Sex: Treating RN: 1946/05/11 (75 y.o. Benjaman Lobe Primary Care Provider: Other Clinician: [name] Referring Provider: Treating Provider/Extender: [name] Weeks in Treatment: 1 Verbal / Phone Orders: No Diagnosis Coding Follow-up Appointments ppointment in 1 week. - with Dr. Heber [place] [facility] [place] Return A Bathing/ Shower/ Hygiene May shower and wash wound with soap and water. - when changing dressing Off-Loading Other: - Bunny Boots Additional Orders / Instructions Follow [place] wound care orders this week; continue Home Health for wound care. May utilize formulary equivalent dressing for wound treatment orders unless otherwise specified. [facility] for wound care, 2-3x week Other Home Health Orders/Instructions: - [facility] Wound Treatment Wound #5 - Foot Wound Laterality: Left, Lateral Cleanser: Soap and Water (Home Health) 1 x Per Day/30 Days Discharge Instructions: May shower and wash wound with dial antibacterial soap and water prior  to dressing change. Cleanser: Wound Cleanser (Home Health) 1 x Per Day/30 Days Discharge Instructions: Cleanse the wound with wound cleanser prior to applying a clean dressing using gauze sponges, not tissue or cotton balls. Prim Dressing: Santyl Ointment (Home Health) 1 x Per Day/30 Days ary Discharge Instructions: Apply nickel thick amount to wound bed as instructed Secondary Dressing: Woven Gauze Sponges 2x2 in (Home Health) 1 x Per Day/30 Days Discharge Instructions: Apply over primary dressing as directed. Secondary Dressing: Zetuvit Plus Silicone Border Dressing 4x4 (in/in) (Home Health) 1 x Per Day/30 Days Discharge Instructions: Apply silicone border over primary dressing as directed. Wound #6 - Ankle Wound Laterality: Left, Lateral Cleanser: Soap and Water (Home Health) 1 x Per Day/30 Days Discharge  Instructions: May shower and wash wound with dial antibacterial soap and water prior to dressing change. Cleanser: Wound Cleanser (Home Health) 1 x Per Day/30 Days Discharge Instructions: Cleanse the wound with wound cleanser prior to applying a clean dressing using gauze sponges, not tissue or cotton balls. Prim Dressing: Santyl Ointment (Home Health) 1 x Per Day/30 Days ary Discharge Instructions: Apply nickel thick amount to wound bed as instructed Secondary Dressing: Woven Gauze Sponges 2x2 in (Home Health) 1 x Per Day/30 Days Discharge Instructions: Apply over primary dressing as directed. Secondary Dressing: Zetuvit Plus Silicone Border Dressing 4x4 (in/in) (Home Health) 1 x Per Day/30 Days Discharge Instructions: Apply silicone border over primary dressing as directed. Electronic Signature(s) Signed: 07/04/2021 11:31:08 AM By: Kalman Shan DO Entered By: Kalman Shan on 07/04/2021 11:29:11 -------------------------------------------------------------------------------- Problem List Details Patient Name: Date of Service: Crystal Grammes M. 07/04/2021 10:00 A M Medical Record Number: [id number] Patient Account Number: [id number] Date of Birth/Sex: Treating RN: [birth year] (75 y.o. F) Primary Care Provider: Other Clinician: [name] Referring Provider: Treating Provider/Extender: [name] Weeks in Treatment: 1 Active Problems ICD-10 Encounter Code Description Active Date MDM Diagnosis L89.893 Pressure ulcer of other site, stage 3 06/26/2021 No Yes S91.302A Unspecified open wound, left foot, initial encounter 06/26/2021 No Yes I73.9 Peripheral vascular disease, unspecified 06/26/2021 No Yes E11.621 Type 2 diabetes mellitus with foot ulcer 06/26/2021 No Yes Inactive Problems Resolved Problems Electronic Signature(s) Signed: 07/04/2021 11:31:08 AM By: Kalman Shan DO Entered By: Kalman Shan on 07/04/2021  11:27:00 -------------------------------------------------------------------------------- Progress Note Details Patient Name: Date of Service: Crystal Grammes M. 07/04/2021 10:00 A M Medical Record Number: [id number] Patient Account Number: [id number] Date of Birth/Sex: Treating RN: 1946/04/06 (75 y.o. F) Primary Care Provider: [name] Other Clinician: Referring Provider: Treating Provider/Extender: [place], Jake Samples in Treatment: 1 Subjective Chief Complaint Information obtained from Patient 08/08/2020; patient is here accompanied by her grandson for review of wounds on the left lateral malleolus as well as an area on the right upper mid tibia 02/25/2021; patient is accompanied by daughter for review of her left lateral malleolus wound 06/26/2021; patient is accompanied by daughter for review of her left lateral malleolus wound and left lateral foot wound History of Present Illness (HPI) Admission 02/25/2021 Ms. Rileigh Kawashima is a 75 year old female with a past medical history of uncontrolled insulin-dependent type 2 diabetes, CVA with left hemiparesis, vascular dementia and peripheral vascular disease that presents to the clinic for worsening left lateral malleolus wound. She was treated in our clinic earlier this year for the wound. It was well-healing at that time however she has not followed up since March. She has noticed a decline in the past 1.5 months. She has been using silver alginate to the  area. She currently denies signs of infection. She does report tenderness to the wound bed. 8/22; patient presents for follow-up. Daughter is not present today. Patient has no issues or complaints today. She states that her daughter is doing the dressing changes. She has not obtained the x-ray ordered at last clinic visit. She is currently not in pain. Readmission 06/26/2021 Patient was last seen on 03/17/2021 for a left lateral malleolus wound. It is unclear  why she has not followed up. She continues to have a left lateral malleolus wound and now has developed a left lateral foot wound. She has been using silver alginate to these areas. She currently denies signs of infection. She still has not obtained the x-ray ordered at last admission. 12/9/; patient presents for follow-up. She has not picked up Santyl from the pharmacy. She continues to use silver alginate to the 2 wound beds. She reports improvement in healing. Patient History Unable to Obtain Patient History due to Aphasia. Information obtained from Patient. Family History Unknown History. Social History Former smoker - quit 2 years, Marital Status - Single, Alcohol Use - Never, Drug Use - No History, Caffeine Use - Rarely. Medical History Eyes Patient has history of Cataracts - bil removed Respiratory Patient has history of Chronic Obstructive Pulmonary Disease (COPD) Cardiovascular Patient has history of Hypertension Endocrine Patient has history of Type II Diabetes Musculoskeletal Patient has history of Osteoarthritis Neurologic Patient has history of Seizure Disorder Psychiatric Denies history of Anorexia/bulimia, Confinement Anxiety Medical A Surgical History Notes nd Cardiovascular CVA Genitourinary Pyelonephritis Neurologic left hemiparesis, CVA Objective Constitutional respirations regular, non-labored and within target range for patient.. Vitals Time Taken: 10:36 AM, Temperature: 97.5 F, Pulse: 87 bpm, Respiratory Rate: 20 breaths/min, Blood Pressure: 173/83 mmHg. Cardiovascular 2+ dorsalis pedis/posterior tibialis pulses. Psychiatric pleasant and cooperative. General Notes: Left foot: T the lateral malleolus there is an open wound with granulation tissue and nonviable tissue present. T the lateral aspect there is an o o open wound with Granulation tissue and nonviable tissue. No obvious signs of infection on exam. Integumentary (Hair, Skin) Wound #5  status is Open. Original cause of wound was Pressure Injury. The date acquired was: 06/05/2021. The wound has been in treatment 1 weeks. The wound is located on the Left,Lateral Foot. The wound measures 0.6cm length x 1.3cm width x 0.1cm depth; 0.613cm^2 area and 0.061cm^3 volume. There is Fat Layer (Subcutaneous Tissue) exposed. There is no tunneling or undermining noted. There is a medium amount of purulent drainage noted. The wound margin is distinct with the outline attached to the wound base. There is small (1-33%) red granulation within the wound bed. There is a large (67-100%) amount of necrotic tissue within the wound bed including Adherent Slough. Wound #6 status is Open. Original cause of wound was Pressure Injury. The date acquired was: 03/27/2020. The wound has been in treatment 1 weeks. The wound is located on the Left,Lateral Ankle. The wound measures 1.6cm length x 0.8cm width x 0.3cm depth; 1.005cm^2 area and 0.302cm^3 volume. There is Fat Layer (Subcutaneous Tissue) exposed. There is no tunneling or undermining noted. There is a medium amount of purulent drainage noted. The wound margin is distinct with the outline attached to the wound base. There is large (67-100%) red granulation within the wound bed. There is a small (1-33%) amount of necrotic tissue within the wound bed including Adherent Slough. Assessment Active Problems ICD-10 Pressure ulcer of other site, stage 3 Unspecified open wound, left foot, initial encounter Peripheral vascular disease,  unspecified Type 2 diabetes mellitus with foot ulcer Patient's wounds have shown slight improvement in appearance and size since last clinic visit. There are no signs of infection on exam. I debrided nonviable tissue. I recommended starting Santyl and continuing silver alginate. I recommended aggressive offloading. Follow-up in 1 week. Procedures Wound #5 Pre-procedure diagnosis of Wound #5 is a Pressure Ulcer located on the  Left,Lateral Foot . There was a Selective/Open Wound Non-Viable Tissue Debridement with a total area of 0.78 sq cm performed by Kalman Shan, DO. With the following instrument(s): Curette to remove Viable and Non-Viable tissue/material. Material removed includes [place] after achieving pain control using Lidocaine. No specimens were taken. A time out was conducted at 11:13, prior to the start of the procedure. A Minimum amount of bleeding was controlled with Pressure. The procedure was tolerated well with a pain level of 0 throughout and a pain level of 0 following the procedure. Post Debridement Measurements: 0.6cm length x 1.3cm width x 0.1cm depth; 0.061cm^3 volume. Post debridement Stage noted as Category/Stage III. Character of Wound/Ulcer Post Debridement is improved. Post procedure Diagnosis Wound #5: Same as Pre-Procedure Wound #6 Pre-procedure diagnosis of Wound #6 is a Pressure Ulcer located on the Left,Lateral Ankle . There was a Selective/Open Wound Non-Viable Tissue Debridement with a total area of 1.28 sq cm performed by Kalman Shan, DO. With the following instrument(s): Curette to remove Viable and Non-Viable tissue/material. Material removed includes [place] after achieving pain control using Lidocaine. No specimens were taken. A time out was conducted at 11:13, prior to the start of the procedure. A Minimum amount of bleeding was controlled with Pressure. The procedure was tolerated well with a pain level of 0 throughout and a pain level of 0 following the procedure. Post Debridement Measurements: 1.6cm length x 0.8cm width x 0.3cm depth; 0.302cm^3 volume. Post debridement Stage noted as Category/Stage III. Character of Wound/Ulcer Post Debridement is improved. Post procedure Diagnosis Wound #6: Same as Pre-Procedure Plan Follow-up Appointments: Return Appointment in 1 week. - with Dr. Marigene Ehlers Shower/ Hygiene: May shower and wash wound with soap and water. - when  changing dressing Off-Loading: Other: - Bunny Boots Additional Orders / Instructions: Follow [facility] Home Health: New wound care orders this week; continue Home Health for wound care. May utilize formulary equivalent dressing for wound treatment orders unless otherwise specified. [facility] for wound care, 2-3x week Other Home Health Orders/Instructions: - [facility] WOUND #5: - Foot Wound Laterality: Left, Lateral Cleanser: Soap and Water (Home Health) 1 x Per Day/30 Days Discharge Instructions: May shower and wash wound with dial antibacterial soap and water prior to dressing change. Cleanser: Wound Cleanser (Home Health) 1 x Per Day/30 Days Discharge Instructions: Cleanse the wound with wound cleanser prior to applying a clean dressing using gauze sponges, not tissue or cotton balls. Prim Dressing: Santyl Ointment (Home Health) 1 x Per Day/30 Days ary Discharge Instructions: Apply nickel thick amount to wound bed as instructed Secondary Dressing: Woven Gauze Sponges 2x2 in (Home Health) 1 x Per Day/30 Days Discharge Instructions: Apply over primary dressing as directed. Secondary Dressing: Zetuvit Plus Silicone Border Dressing 4x4 (in/in) (Home Health) 1 x Per Day/30 Days Discharge Instructions: Apply silicone border over primary dressing as directed. WOUND #6: - Ankle Wound Laterality: Left, Lateral Cleanser: Soap and Water (Home Health) 1 x Per Day/30 Days Discharge Instructions: May shower and wash wound with dial antibacterial soap and water prior to dressing change. Cleanser: Wound Cleanser (Home Health) 1 x  Per Day/30 Days Discharge Instructions: Cleanse the wound with wound cleanser prior to applying a clean dressing using gauze sponges, not tissue or cotton balls. Prim Dressing: Santyl Ointment (Home Health) 1 x Per Day/30 Days ary Discharge Instructions: Apply nickel thick amount to wound bed as instructed Secondary Dressing: Woven Gauze Sponges 2x2 in (Home Health)  1 x Per Day/30 Days Discharge Instructions: Apply over primary dressing as directed. Secondary Dressing: Zetuvit Plus Silicone Border Dressing 4x4 (in/in) (Home Health) 1 x Per Day/30 Days Discharge Instructions: Apply silicone border over primary dressing as directed. 1. In office sharp debridement 2. Santyl and silver alginate 3. Follow-up in 1 week Electronic Signature(s) Signed: 07/04/2021 11:31:08 AM By: Kalman Shan DO Entered By: Kalman Shan on 07/04/2021 11:30:27 -------------------------------------------------------------------------------- HxROS Details Patient Name: Date of Service: Crystal Grammes M. 07/04/2021 10:00 A M Medical Record Number: [id number] Patient Account Number: [id number] Date of Birth/Sex: Treating RN: 1946/02/20 (75 y.o. F) Primary Care Provider: [name] Other Clinician: Referring Provider: Treating Provider/Extender: [name] Weeks in Treatment: 1 Unable to Obtain Patient History due to Aphasia Information Obtained From Patient Eyes Medical History: Positive for: Cataracts - bil removed Respiratory Medical History: Positive for: Chronic Obstructive Pulmonary Disease (COPD) Cardiovascular Medical History: Positive for: Hypertension Past Medical History Notes: CVA Endocrine Medical History: Positive for: Type II Diabetes Time with diabetes: >30 yrs Treated with: Insulin, Oral agents Blood sugar tested every day: No Genitourinary Medical History: Past Medical History Notes: Pyelonephritis Musculoskeletal Medical History: Positive for: Osteoarthritis Neurologic Medical History: Positive for: Seizure Disorder Past Medical History Notes: left hemiparesis, CVA Psychiatric Medical History: Negative for: Anorexia/bulimia; Confinement Anxiety HBO Extended History Items Eyes: Cataracts Immunizations Pneumococcal Vaccine: Received Pneumococcal Vaccination: Yes Received  Pneumococcal Vaccination On or After 60th Birthday: Yes Implantable Devices None Family and Social History Unknown History: Yes; Former smoker - quit 2 years; Marital Status - Single; Alcohol Use: Never; Drug Use: No History; Caffeine Use: Rarely; Financial Concerns: No; Food, Clothing or Shelter Needs: No; Support System Lacking: No; Transportation Concerns: No Electronic Signature(s) Signed: 07/04/2021 11:31:08 AM By: Kalman Shan DO Entered By: Kalman Shan on 07/04/2021 11:28:21 -------------------------------------------------------------------------------- SuperBill Details Patient Name: Date of Service: Crystal Lennox. 07/04/2021 Medical Record Number: [id number] Patient Account Number: [id number] Date of Birth/Sex: Treating RN: 1945/11/06 (75 y.o. F) Primary Care Provider: [name] Other Clinician: Referring Provider: Treating Provider/Extender: [name] Weeks in Treatment: 1 Diagnosis Coding ICD-10 Codes Code Description [id number] Pressure ulcer of other site, stage 3 S91.302A Unspecified open wound, left foot, initial encounter I73.9 Peripheral vascular disease, unspecified E11.621 Type 2 diabetes mellitus with foot ulcer Facility Procedures CPT4 Code: [id number] Description: [phone] - DEBRIDE WOUND 1ST 20 SQ CM OR < ICD-10 Diagnosis Description L89.893 Pressure ulcer of other site, stage 3 S91.302A Unspecified open wound, left foot, initial encounter E11.621 Type 2 diabetes mellitus with foot ulcer Modifier: Quantity: 1 Physician Procedures : CPT4 Code Description Modifier [id number] - WC PHYS DEBR WO ANESTH 20 SQ CM ICD-10 Diagnosis Description L89.893 Pressure ulcer of other site, stage 3 S91.302A Unspecified open wound, left foot, initial encounter E11.621 Type 2 diabetes mellitus  with foot ulcer Quantity: 1 Electronic Signature(s) Signed: 07/04/2021 11:31:08 AM By: Kalman Shan DO Entered By: Kalman Shan  on 07/04/2021 11:30:46

## 2021-07-07 DIAGNOSIS — L97328 Non-pressure chronic ulcer of left ankle with other specified severity: Secondary | ICD-10-CM | POA: Diagnosis not present

## 2021-07-07 DIAGNOSIS — E1151 Type 2 diabetes mellitus with diabetic peripheral angiopathy without gangrene: Secondary | ICD-10-CM | POA: Diagnosis not present

## 2021-07-07 DIAGNOSIS — Z794 Long term (current) use of insulin: Secondary | ICD-10-CM | POA: Diagnosis not present

## 2021-07-07 DIAGNOSIS — L89893 Pressure ulcer of other site, stage 3: Secondary | ICD-10-CM | POA: Diagnosis not present

## 2021-07-07 DIAGNOSIS — E11622 Type 2 diabetes mellitus with other skin ulcer: Secondary | ICD-10-CM | POA: Diagnosis not present

## 2021-07-07 DIAGNOSIS — R531 Weakness: Secondary | ICD-10-CM | POA: Diagnosis not present

## 2021-07-07 DIAGNOSIS — J9611 Chronic respiratory failure with hypoxia: Secondary | ICD-10-CM | POA: Diagnosis not present

## 2021-07-07 DIAGNOSIS — R2681 Unsteadiness on feet: Secondary | ICD-10-CM | POA: Diagnosis not present

## 2021-07-07 DIAGNOSIS — I739 Peripheral vascular disease, unspecified: Secondary | ICD-10-CM | POA: Diagnosis not present

## 2021-07-07 DIAGNOSIS — J449 Chronic obstructive pulmonary disease, unspecified: Secondary | ICD-10-CM | POA: Diagnosis not present

## 2021-07-10 DIAGNOSIS — E11622 Type 2 diabetes mellitus with other skin ulcer: Secondary | ICD-10-CM | POA: Diagnosis not present

## 2021-07-10 DIAGNOSIS — I10 Essential (primary) hypertension: Secondary | ICD-10-CM | POA: Diagnosis not present

## 2021-07-10 DIAGNOSIS — J9611 Chronic respiratory failure with hypoxia: Secondary | ICD-10-CM | POA: Diagnosis not present

## 2021-07-10 DIAGNOSIS — L97328 Non-pressure chronic ulcer of left ankle with other specified severity: Secondary | ICD-10-CM | POA: Diagnosis not present

## 2021-07-10 DIAGNOSIS — I739 Peripheral vascular disease, unspecified: Secondary | ICD-10-CM | POA: Diagnosis not present

## 2021-07-10 DIAGNOSIS — R2681 Unsteadiness on feet: Secondary | ICD-10-CM | POA: Diagnosis not present

## 2021-07-10 DIAGNOSIS — R531 Weakness: Secondary | ICD-10-CM | POA: Diagnosis not present

## 2021-07-10 DIAGNOSIS — L89893 Pressure ulcer of other site, stage 3: Secondary | ICD-10-CM | POA: Diagnosis not present

## 2021-07-10 DIAGNOSIS — E1151 Type 2 diabetes mellitus with diabetic peripheral angiopathy without gangrene: Secondary | ICD-10-CM | POA: Diagnosis not present

## 2021-07-10 DIAGNOSIS — J9691 Respiratory failure, unspecified with hypoxia: Secondary | ICD-10-CM | POA: Diagnosis not present

## 2021-07-10 DIAGNOSIS — Z8709 Personal history of other diseases of the respiratory system: Secondary | ICD-10-CM | POA: Diagnosis not present

## 2021-07-10 DIAGNOSIS — J449 Chronic obstructive pulmonary disease, unspecified: Secondary | ICD-10-CM | POA: Diagnosis not present

## 2021-07-10 DIAGNOSIS — Z794 Long term (current) use of insulin: Secondary | ICD-10-CM | POA: Diagnosis not present

## 2021-07-10 DIAGNOSIS — E1165 Type 2 diabetes mellitus with hyperglycemia: Secondary | ICD-10-CM | POA: Diagnosis not present

## 2021-07-10 DIAGNOSIS — I69354 Hemiplegia and hemiparesis following cerebral infarction affecting left non-dominant side: Secondary | ICD-10-CM | POA: Diagnosis not present

## 2021-07-10 DIAGNOSIS — I69919 Unspecified symptoms and signs involving cognitive functions following unspecified cerebrovascular disease: Secondary | ICD-10-CM | POA: Diagnosis not present

## 2021-07-11 ENCOUNTER — Encounter (HOSPITAL_BASED_OUTPATIENT_CLINIC_OR_DEPARTMENT_OTHER): Payer: Medicare Other | Admitting: Internal Medicine

## 2021-07-15 DIAGNOSIS — E11622 Type 2 diabetes mellitus with other skin ulcer: Secondary | ICD-10-CM | POA: Diagnosis not present

## 2021-07-15 DIAGNOSIS — L89893 Pressure ulcer of other site, stage 3: Secondary | ICD-10-CM | POA: Diagnosis not present

## 2021-07-15 DIAGNOSIS — Z794 Long term (current) use of insulin: Secondary | ICD-10-CM | POA: Diagnosis not present

## 2021-07-15 DIAGNOSIS — J449 Chronic obstructive pulmonary disease, unspecified: Secondary | ICD-10-CM | POA: Diagnosis not present

## 2021-07-15 DIAGNOSIS — R531 Weakness: Secondary | ICD-10-CM | POA: Diagnosis not present

## 2021-07-15 DIAGNOSIS — L97328 Non-pressure chronic ulcer of left ankle with other specified severity: Secondary | ICD-10-CM | POA: Diagnosis not present

## 2021-07-15 DIAGNOSIS — J9611 Chronic respiratory failure with hypoxia: Secondary | ICD-10-CM | POA: Diagnosis not present

## 2021-07-15 DIAGNOSIS — I739 Peripheral vascular disease, unspecified: Secondary | ICD-10-CM | POA: Diagnosis not present

## 2021-07-15 DIAGNOSIS — E1151 Type 2 diabetes mellitus with diabetic peripheral angiopathy without gangrene: Secondary | ICD-10-CM | POA: Diagnosis not present

## 2021-07-15 DIAGNOSIS — R2681 Unsteadiness on feet: Secondary | ICD-10-CM | POA: Diagnosis not present

## 2021-07-17 DIAGNOSIS — L97328 Non-pressure chronic ulcer of left ankle with other specified severity: Secondary | ICD-10-CM | POA: Diagnosis not present

## 2021-07-17 DIAGNOSIS — I739 Peripheral vascular disease, unspecified: Secondary | ICD-10-CM | POA: Diagnosis not present

## 2021-07-17 DIAGNOSIS — R531 Weakness: Secondary | ICD-10-CM | POA: Diagnosis not present

## 2021-07-17 DIAGNOSIS — E11622 Type 2 diabetes mellitus with other skin ulcer: Secondary | ICD-10-CM | POA: Diagnosis not present

## 2021-07-17 DIAGNOSIS — Z794 Long term (current) use of insulin: Secondary | ICD-10-CM | POA: Diagnosis not present

## 2021-07-17 DIAGNOSIS — J449 Chronic obstructive pulmonary disease, unspecified: Secondary | ICD-10-CM | POA: Diagnosis not present

## 2021-07-17 DIAGNOSIS — E1151 Type 2 diabetes mellitus with diabetic peripheral angiopathy without gangrene: Secondary | ICD-10-CM | POA: Diagnosis not present

## 2021-07-17 DIAGNOSIS — L89893 Pressure ulcer of other site, stage 3: Secondary | ICD-10-CM | POA: Diagnosis not present

## 2021-07-17 DIAGNOSIS — J9611 Chronic respiratory failure with hypoxia: Secondary | ICD-10-CM | POA: Diagnosis not present

## 2021-07-17 DIAGNOSIS — R2681 Unsteadiness on feet: Secondary | ICD-10-CM | POA: Diagnosis not present

## 2021-07-22 DIAGNOSIS — E1151 Type 2 diabetes mellitus with diabetic peripheral angiopathy without gangrene: Secondary | ICD-10-CM | POA: Diagnosis not present

## 2021-07-22 DIAGNOSIS — R531 Weakness: Secondary | ICD-10-CM | POA: Diagnosis not present

## 2021-07-22 DIAGNOSIS — J449 Chronic obstructive pulmonary disease, unspecified: Secondary | ICD-10-CM | POA: Diagnosis not present

## 2021-07-22 DIAGNOSIS — E11622 Type 2 diabetes mellitus with other skin ulcer: Secondary | ICD-10-CM | POA: Diagnosis not present

## 2021-07-22 DIAGNOSIS — J9611 Chronic respiratory failure with hypoxia: Secondary | ICD-10-CM | POA: Diagnosis not present

## 2021-07-22 DIAGNOSIS — R2681 Unsteadiness on feet: Secondary | ICD-10-CM | POA: Diagnosis not present

## 2021-07-22 DIAGNOSIS — I739 Peripheral vascular disease, unspecified: Secondary | ICD-10-CM | POA: Diagnosis not present

## 2021-07-22 DIAGNOSIS — L97328 Non-pressure chronic ulcer of left ankle with other specified severity: Secondary | ICD-10-CM | POA: Diagnosis not present

## 2021-07-22 DIAGNOSIS — Z794 Long term (current) use of insulin: Secondary | ICD-10-CM | POA: Diagnosis not present

## 2021-07-22 DIAGNOSIS — L89893 Pressure ulcer of other site, stage 3: Secondary | ICD-10-CM | POA: Diagnosis not present

## 2021-07-24 DIAGNOSIS — R2681 Unsteadiness on feet: Secondary | ICD-10-CM | POA: Diagnosis not present

## 2021-07-24 DIAGNOSIS — J449 Chronic obstructive pulmonary disease, unspecified: Secondary | ICD-10-CM | POA: Diagnosis not present

## 2021-07-24 DIAGNOSIS — L97328 Non-pressure chronic ulcer of left ankle with other specified severity: Secondary | ICD-10-CM | POA: Diagnosis not present

## 2021-07-24 DIAGNOSIS — E1151 Type 2 diabetes mellitus with diabetic peripheral angiopathy without gangrene: Secondary | ICD-10-CM | POA: Diagnosis not present

## 2021-07-24 DIAGNOSIS — R531 Weakness: Secondary | ICD-10-CM | POA: Diagnosis not present

## 2021-07-24 DIAGNOSIS — J9611 Chronic respiratory failure with hypoxia: Secondary | ICD-10-CM | POA: Diagnosis not present

## 2021-07-24 DIAGNOSIS — Z794 Long term (current) use of insulin: Secondary | ICD-10-CM | POA: Diagnosis not present

## 2021-07-24 DIAGNOSIS — I739 Peripheral vascular disease, unspecified: Secondary | ICD-10-CM | POA: Diagnosis not present

## 2021-07-24 DIAGNOSIS — L89893 Pressure ulcer of other site, stage 3: Secondary | ICD-10-CM | POA: Diagnosis not present

## 2021-07-24 DIAGNOSIS — E11622 Type 2 diabetes mellitus with other skin ulcer: Secondary | ICD-10-CM | POA: Diagnosis not present

## 2021-07-25 DIAGNOSIS — J449 Chronic obstructive pulmonary disease, unspecified: Secondary | ICD-10-CM | POA: Diagnosis not present

## 2021-07-25 DIAGNOSIS — E1165 Type 2 diabetes mellitus with hyperglycemia: Secondary | ICD-10-CM | POA: Diagnosis not present

## 2021-07-25 DIAGNOSIS — E1139 Type 2 diabetes mellitus with other diabetic ophthalmic complication: Secondary | ICD-10-CM | POA: Diagnosis not present

## 2021-07-25 DIAGNOSIS — I10 Essential (primary) hypertension: Secondary | ICD-10-CM | POA: Diagnosis not present

## 2021-07-25 DIAGNOSIS — D500 Iron deficiency anemia secondary to blood loss (chronic): Secondary | ICD-10-CM | POA: Diagnosis not present

## 2021-07-25 DIAGNOSIS — E11621 Type 2 diabetes mellitus with foot ulcer: Secondary | ICD-10-CM | POA: Diagnosis not present

## 2021-07-28 ENCOUNTER — Encounter (HOSPITAL_COMMUNITY): Payer: Self-pay | Admitting: Radiology

## 2021-07-29 DIAGNOSIS — J439 Emphysema, unspecified: Secondary | ICD-10-CM | POA: Diagnosis not present

## 2021-07-30 ENCOUNTER — Ambulatory Visit: Payer: Medicare Other | Admitting: Podiatry

## 2021-07-30 DIAGNOSIS — L89893 Pressure ulcer of other site, stage 3: Secondary | ICD-10-CM | POA: Diagnosis not present

## 2021-07-30 DIAGNOSIS — J449 Chronic obstructive pulmonary disease, unspecified: Secondary | ICD-10-CM | POA: Diagnosis not present

## 2021-07-30 DIAGNOSIS — J9611 Chronic respiratory failure with hypoxia: Secondary | ICD-10-CM | POA: Diagnosis not present

## 2021-07-30 DIAGNOSIS — E1151 Type 2 diabetes mellitus with diabetic peripheral angiopathy without gangrene: Secondary | ICD-10-CM | POA: Diagnosis not present

## 2021-07-30 DIAGNOSIS — I739 Peripheral vascular disease, unspecified: Secondary | ICD-10-CM | POA: Diagnosis not present

## 2021-07-30 DIAGNOSIS — Z794 Long term (current) use of insulin: Secondary | ICD-10-CM | POA: Diagnosis not present

## 2021-07-30 DIAGNOSIS — R2681 Unsteadiness on feet: Secondary | ICD-10-CM | POA: Diagnosis not present

## 2021-07-30 DIAGNOSIS — R531 Weakness: Secondary | ICD-10-CM | POA: Diagnosis not present

## 2021-07-30 DIAGNOSIS — E11622 Type 2 diabetes mellitus with other skin ulcer: Secondary | ICD-10-CM | POA: Diagnosis not present

## 2021-07-30 DIAGNOSIS — L97328 Non-pressure chronic ulcer of left ankle with other specified severity: Secondary | ICD-10-CM | POA: Diagnosis not present

## 2021-07-31 DIAGNOSIS — R531 Weakness: Secondary | ICD-10-CM | POA: Diagnosis not present

## 2021-08-01 DIAGNOSIS — E1151 Type 2 diabetes mellitus with diabetic peripheral angiopathy without gangrene: Secondary | ICD-10-CM | POA: Diagnosis not present

## 2021-08-01 DIAGNOSIS — I739 Peripheral vascular disease, unspecified: Secondary | ICD-10-CM | POA: Diagnosis not present

## 2021-08-01 DIAGNOSIS — L89893 Pressure ulcer of other site, stage 3: Secondary | ICD-10-CM | POA: Diagnosis not present

## 2021-08-01 DIAGNOSIS — R531 Weakness: Secondary | ICD-10-CM | POA: Diagnosis not present

## 2021-08-01 DIAGNOSIS — R2681 Unsteadiness on feet: Secondary | ICD-10-CM | POA: Diagnosis not present

## 2021-08-01 DIAGNOSIS — E11622 Type 2 diabetes mellitus with other skin ulcer: Secondary | ICD-10-CM | POA: Diagnosis not present

## 2021-08-01 DIAGNOSIS — L97328 Non-pressure chronic ulcer of left ankle with other specified severity: Secondary | ICD-10-CM | POA: Diagnosis not present

## 2021-08-01 DIAGNOSIS — J9611 Chronic respiratory failure with hypoxia: Secondary | ICD-10-CM | POA: Diagnosis not present

## 2021-08-01 DIAGNOSIS — Z794 Long term (current) use of insulin: Secondary | ICD-10-CM | POA: Diagnosis not present

## 2021-08-01 DIAGNOSIS — J449 Chronic obstructive pulmonary disease, unspecified: Secondary | ICD-10-CM | POA: Diagnosis not present

## 2021-08-05 DIAGNOSIS — I739 Peripheral vascular disease, unspecified: Secondary | ICD-10-CM | POA: Diagnosis not present

## 2021-08-05 DIAGNOSIS — R2681 Unsteadiness on feet: Secondary | ICD-10-CM | POA: Diagnosis not present

## 2021-08-05 DIAGNOSIS — J9611 Chronic respiratory failure with hypoxia: Secondary | ICD-10-CM | POA: Diagnosis not present

## 2021-08-05 DIAGNOSIS — E11622 Type 2 diabetes mellitus with other skin ulcer: Secondary | ICD-10-CM | POA: Diagnosis not present

## 2021-08-05 DIAGNOSIS — J449 Chronic obstructive pulmonary disease, unspecified: Secondary | ICD-10-CM | POA: Diagnosis not present

## 2021-08-05 DIAGNOSIS — E1151 Type 2 diabetes mellitus with diabetic peripheral angiopathy without gangrene: Secondary | ICD-10-CM | POA: Diagnosis not present

## 2021-08-05 DIAGNOSIS — L89893 Pressure ulcer of other site, stage 3: Secondary | ICD-10-CM | POA: Diagnosis not present

## 2021-08-05 DIAGNOSIS — L97328 Non-pressure chronic ulcer of left ankle with other specified severity: Secondary | ICD-10-CM | POA: Diagnosis not present

## 2021-08-05 DIAGNOSIS — R531 Weakness: Secondary | ICD-10-CM | POA: Diagnosis not present

## 2021-08-07 DIAGNOSIS — E1151 Type 2 diabetes mellitus with diabetic peripheral angiopathy without gangrene: Secondary | ICD-10-CM | POA: Diagnosis not present

## 2021-08-07 DIAGNOSIS — I739 Peripheral vascular disease, unspecified: Secondary | ICD-10-CM | POA: Diagnosis not present

## 2021-08-07 DIAGNOSIS — R2681 Unsteadiness on feet: Secondary | ICD-10-CM | POA: Diagnosis not present

## 2021-08-07 DIAGNOSIS — J449 Chronic obstructive pulmonary disease, unspecified: Secondary | ICD-10-CM | POA: Diagnosis not present

## 2021-08-07 DIAGNOSIS — L97328 Non-pressure chronic ulcer of left ankle with other specified severity: Secondary | ICD-10-CM | POA: Diagnosis not present

## 2021-08-07 DIAGNOSIS — R531 Weakness: Secondary | ICD-10-CM | POA: Diagnosis not present

## 2021-08-07 DIAGNOSIS — L89893 Pressure ulcer of other site, stage 3: Secondary | ICD-10-CM | POA: Diagnosis not present

## 2021-08-07 DIAGNOSIS — E11622 Type 2 diabetes mellitus with other skin ulcer: Secondary | ICD-10-CM | POA: Diagnosis not present

## 2021-08-07 DIAGNOSIS — J9611 Chronic respiratory failure with hypoxia: Secondary | ICD-10-CM | POA: Diagnosis not present

## 2021-08-12 DIAGNOSIS — R2681 Unsteadiness on feet: Secondary | ICD-10-CM | POA: Diagnosis not present

## 2021-08-12 DIAGNOSIS — J449 Chronic obstructive pulmonary disease, unspecified: Secondary | ICD-10-CM | POA: Diagnosis not present

## 2021-08-12 DIAGNOSIS — E1151 Type 2 diabetes mellitus with diabetic peripheral angiopathy without gangrene: Secondary | ICD-10-CM | POA: Diagnosis not present

## 2021-08-12 DIAGNOSIS — L89893 Pressure ulcer of other site, stage 3: Secondary | ICD-10-CM | POA: Diagnosis not present

## 2021-08-12 DIAGNOSIS — L97328 Non-pressure chronic ulcer of left ankle with other specified severity: Secondary | ICD-10-CM | POA: Diagnosis not present

## 2021-08-12 DIAGNOSIS — E11622 Type 2 diabetes mellitus with other skin ulcer: Secondary | ICD-10-CM | POA: Diagnosis not present

## 2021-08-12 DIAGNOSIS — I739 Peripheral vascular disease, unspecified: Secondary | ICD-10-CM | POA: Diagnosis not present

## 2021-08-12 DIAGNOSIS — J9611 Chronic respiratory failure with hypoxia: Secondary | ICD-10-CM | POA: Diagnosis not present

## 2021-08-12 DIAGNOSIS — R531 Weakness: Secondary | ICD-10-CM | POA: Diagnosis not present

## 2021-08-13 DIAGNOSIS — L89893 Pressure ulcer of other site, stage 3: Secondary | ICD-10-CM | POA: Diagnosis not present

## 2021-08-14 DIAGNOSIS — R2681 Unsteadiness on feet: Secondary | ICD-10-CM | POA: Diagnosis not present

## 2021-08-14 DIAGNOSIS — I739 Peripheral vascular disease, unspecified: Secondary | ICD-10-CM | POA: Diagnosis not present

## 2021-08-14 DIAGNOSIS — J9611 Chronic respiratory failure with hypoxia: Secondary | ICD-10-CM | POA: Diagnosis not present

## 2021-08-14 DIAGNOSIS — J449 Chronic obstructive pulmonary disease, unspecified: Secondary | ICD-10-CM | POA: Diagnosis not present

## 2021-08-14 DIAGNOSIS — L89893 Pressure ulcer of other site, stage 3: Secondary | ICD-10-CM | POA: Diagnosis not present

## 2021-08-14 DIAGNOSIS — E11622 Type 2 diabetes mellitus with other skin ulcer: Secondary | ICD-10-CM | POA: Diagnosis not present

## 2021-08-14 DIAGNOSIS — L97328 Non-pressure chronic ulcer of left ankle with other specified severity: Secondary | ICD-10-CM | POA: Diagnosis not present

## 2021-08-14 DIAGNOSIS — E1151 Type 2 diabetes mellitus with diabetic peripheral angiopathy without gangrene: Secondary | ICD-10-CM | POA: Diagnosis not present

## 2021-08-14 DIAGNOSIS — R531 Weakness: Secondary | ICD-10-CM | POA: Diagnosis not present

## 2021-08-19 DIAGNOSIS — J9611 Chronic respiratory failure with hypoxia: Secondary | ICD-10-CM | POA: Diagnosis not present

## 2021-08-19 DIAGNOSIS — R531 Weakness: Secondary | ICD-10-CM | POA: Diagnosis not present

## 2021-08-19 DIAGNOSIS — R2681 Unsteadiness on feet: Secondary | ICD-10-CM | POA: Diagnosis not present

## 2021-08-19 DIAGNOSIS — L89893 Pressure ulcer of other site, stage 3: Secondary | ICD-10-CM | POA: Diagnosis not present

## 2021-08-19 DIAGNOSIS — J449 Chronic obstructive pulmonary disease, unspecified: Secondary | ICD-10-CM | POA: Diagnosis not present

## 2021-08-19 DIAGNOSIS — E1151 Type 2 diabetes mellitus with diabetic peripheral angiopathy without gangrene: Secondary | ICD-10-CM | POA: Diagnosis not present

## 2021-08-19 DIAGNOSIS — L97328 Non-pressure chronic ulcer of left ankle with other specified severity: Secondary | ICD-10-CM | POA: Diagnosis not present

## 2021-08-19 DIAGNOSIS — I739 Peripheral vascular disease, unspecified: Secondary | ICD-10-CM | POA: Diagnosis not present

## 2021-08-19 DIAGNOSIS — E11622 Type 2 diabetes mellitus with other skin ulcer: Secondary | ICD-10-CM | POA: Diagnosis not present

## 2021-08-21 DIAGNOSIS — I739 Peripheral vascular disease, unspecified: Secondary | ICD-10-CM | POA: Diagnosis not present

## 2021-08-21 DIAGNOSIS — E1151 Type 2 diabetes mellitus with diabetic peripheral angiopathy without gangrene: Secondary | ICD-10-CM | POA: Diagnosis not present

## 2021-08-21 DIAGNOSIS — J9611 Chronic respiratory failure with hypoxia: Secondary | ICD-10-CM | POA: Diagnosis not present

## 2021-08-21 DIAGNOSIS — R531 Weakness: Secondary | ICD-10-CM | POA: Diagnosis not present

## 2021-08-21 DIAGNOSIS — E11622 Type 2 diabetes mellitus with other skin ulcer: Secondary | ICD-10-CM | POA: Diagnosis not present

## 2021-08-21 DIAGNOSIS — J449 Chronic obstructive pulmonary disease, unspecified: Secondary | ICD-10-CM | POA: Diagnosis not present

## 2021-08-21 DIAGNOSIS — R2681 Unsteadiness on feet: Secondary | ICD-10-CM | POA: Diagnosis not present

## 2021-08-21 DIAGNOSIS — L89893 Pressure ulcer of other site, stage 3: Secondary | ICD-10-CM | POA: Diagnosis not present

## 2021-08-21 DIAGNOSIS — L97328 Non-pressure chronic ulcer of left ankle with other specified severity: Secondary | ICD-10-CM | POA: Diagnosis not present

## 2021-08-22 DIAGNOSIS — E1139 Type 2 diabetes mellitus with other diabetic ophthalmic complication: Secondary | ICD-10-CM | POA: Diagnosis not present

## 2021-08-22 DIAGNOSIS — J449 Chronic obstructive pulmonary disease, unspecified: Secondary | ICD-10-CM | POA: Diagnosis not present

## 2021-08-22 DIAGNOSIS — I10 Essential (primary) hypertension: Secondary | ICD-10-CM | POA: Diagnosis not present

## 2021-08-22 DIAGNOSIS — E11621 Type 2 diabetes mellitus with foot ulcer: Secondary | ICD-10-CM | POA: Diagnosis not present

## 2021-08-22 DIAGNOSIS — D500 Iron deficiency anemia secondary to blood loss (chronic): Secondary | ICD-10-CM | POA: Diagnosis not present

## 2021-08-22 DIAGNOSIS — E1165 Type 2 diabetes mellitus with hyperglycemia: Secondary | ICD-10-CM | POA: Diagnosis not present

## 2021-08-26 DIAGNOSIS — E1151 Type 2 diabetes mellitus with diabetic peripheral angiopathy without gangrene: Secondary | ICD-10-CM | POA: Diagnosis not present

## 2021-08-26 DIAGNOSIS — L97328 Non-pressure chronic ulcer of left ankle with other specified severity: Secondary | ICD-10-CM | POA: Diagnosis not present

## 2021-08-26 DIAGNOSIS — J9611 Chronic respiratory failure with hypoxia: Secondary | ICD-10-CM | POA: Diagnosis not present

## 2021-08-26 DIAGNOSIS — R2681 Unsteadiness on feet: Secondary | ICD-10-CM | POA: Diagnosis not present

## 2021-08-26 DIAGNOSIS — L89893 Pressure ulcer of other site, stage 3: Secondary | ICD-10-CM | POA: Diagnosis not present

## 2021-08-26 DIAGNOSIS — R531 Weakness: Secondary | ICD-10-CM | POA: Diagnosis not present

## 2021-08-26 DIAGNOSIS — E11622 Type 2 diabetes mellitus with other skin ulcer: Secondary | ICD-10-CM | POA: Diagnosis not present

## 2021-08-26 DIAGNOSIS — J449 Chronic obstructive pulmonary disease, unspecified: Secondary | ICD-10-CM | POA: Diagnosis not present

## 2021-08-26 DIAGNOSIS — I739 Peripheral vascular disease, unspecified: Secondary | ICD-10-CM | POA: Diagnosis not present

## 2021-08-29 DIAGNOSIS — E1151 Type 2 diabetes mellitus with diabetic peripheral angiopathy without gangrene: Secondary | ICD-10-CM | POA: Diagnosis not present

## 2021-08-29 DIAGNOSIS — L89893 Pressure ulcer of other site, stage 3: Secondary | ICD-10-CM | POA: Diagnosis not present

## 2021-08-29 DIAGNOSIS — R2681 Unsteadiness on feet: Secondary | ICD-10-CM | POA: Diagnosis not present

## 2021-08-29 DIAGNOSIS — E11622 Type 2 diabetes mellitus with other skin ulcer: Secondary | ICD-10-CM | POA: Diagnosis not present

## 2021-08-29 DIAGNOSIS — J9611 Chronic respiratory failure with hypoxia: Secondary | ICD-10-CM | POA: Diagnosis not present

## 2021-08-29 DIAGNOSIS — J439 Emphysema, unspecified: Secondary | ICD-10-CM | POA: Diagnosis not present

## 2021-08-29 DIAGNOSIS — L97328 Non-pressure chronic ulcer of left ankle with other specified severity: Secondary | ICD-10-CM | POA: Diagnosis not present

## 2021-08-29 DIAGNOSIS — J449 Chronic obstructive pulmonary disease, unspecified: Secondary | ICD-10-CM | POA: Diagnosis not present

## 2021-08-29 DIAGNOSIS — R531 Weakness: Secondary | ICD-10-CM | POA: Diagnosis not present

## 2021-08-29 DIAGNOSIS — I739 Peripheral vascular disease, unspecified: Secondary | ICD-10-CM | POA: Diagnosis not present

## 2021-09-02 DIAGNOSIS — E11622 Type 2 diabetes mellitus with other skin ulcer: Secondary | ICD-10-CM | POA: Diagnosis not present

## 2021-09-02 DIAGNOSIS — J449 Chronic obstructive pulmonary disease, unspecified: Secondary | ICD-10-CM | POA: Diagnosis not present

## 2021-09-02 DIAGNOSIS — R2681 Unsteadiness on feet: Secondary | ICD-10-CM | POA: Diagnosis not present

## 2021-09-02 DIAGNOSIS — R531 Weakness: Secondary | ICD-10-CM | POA: Diagnosis not present

## 2021-09-02 DIAGNOSIS — L97328 Non-pressure chronic ulcer of left ankle with other specified severity: Secondary | ICD-10-CM | POA: Diagnosis not present

## 2021-09-02 DIAGNOSIS — L89893 Pressure ulcer of other site, stage 3: Secondary | ICD-10-CM | POA: Diagnosis not present

## 2021-09-02 DIAGNOSIS — E1151 Type 2 diabetes mellitus with diabetic peripheral angiopathy without gangrene: Secondary | ICD-10-CM | POA: Diagnosis not present

## 2021-09-02 DIAGNOSIS — I739 Peripheral vascular disease, unspecified: Secondary | ICD-10-CM | POA: Diagnosis not present

## 2021-09-02 DIAGNOSIS — J9611 Chronic respiratory failure with hypoxia: Secondary | ICD-10-CM | POA: Diagnosis not present

## 2021-09-04 DIAGNOSIS — R2681 Unsteadiness on feet: Secondary | ICD-10-CM | POA: Diagnosis not present

## 2021-09-04 DIAGNOSIS — J9611 Chronic respiratory failure with hypoxia: Secondary | ICD-10-CM | POA: Diagnosis not present

## 2021-09-04 DIAGNOSIS — E11622 Type 2 diabetes mellitus with other skin ulcer: Secondary | ICD-10-CM | POA: Diagnosis not present

## 2021-09-04 DIAGNOSIS — R531 Weakness: Secondary | ICD-10-CM | POA: Diagnosis not present

## 2021-09-04 DIAGNOSIS — J449 Chronic obstructive pulmonary disease, unspecified: Secondary | ICD-10-CM | POA: Diagnosis not present

## 2021-09-04 DIAGNOSIS — L89893 Pressure ulcer of other site, stage 3: Secondary | ICD-10-CM | POA: Diagnosis not present

## 2021-09-04 DIAGNOSIS — L97328 Non-pressure chronic ulcer of left ankle with other specified severity: Secondary | ICD-10-CM | POA: Diagnosis not present

## 2021-09-04 DIAGNOSIS — I739 Peripheral vascular disease, unspecified: Secondary | ICD-10-CM | POA: Diagnosis not present

## 2021-09-04 DIAGNOSIS — E1151 Type 2 diabetes mellitus with diabetic peripheral angiopathy without gangrene: Secondary | ICD-10-CM | POA: Diagnosis not present

## 2021-09-05 ENCOUNTER — Encounter (HOSPITAL_BASED_OUTPATIENT_CLINIC_OR_DEPARTMENT_OTHER): Payer: Medicare Other | Admitting: Internal Medicine

## 2021-09-08 ENCOUNTER — Ambulatory Visit (HOSPITAL_BASED_OUTPATIENT_CLINIC_OR_DEPARTMENT_OTHER): Payer: Medicare Other | Admitting: Internal Medicine

## 2021-09-09 DIAGNOSIS — R2681 Unsteadiness on feet: Secondary | ICD-10-CM | POA: Diagnosis not present

## 2021-09-09 DIAGNOSIS — J9611 Chronic respiratory failure with hypoxia: Secondary | ICD-10-CM | POA: Diagnosis not present

## 2021-09-09 DIAGNOSIS — L89893 Pressure ulcer of other site, stage 3: Secondary | ICD-10-CM | POA: Diagnosis not present

## 2021-09-09 DIAGNOSIS — I6350 Cerebral infarction due to unspecified occlusion or stenosis of unspecified cerebral artery: Secondary | ICD-10-CM | POA: Diagnosis not present

## 2021-09-09 DIAGNOSIS — J449 Chronic obstructive pulmonary disease, unspecified: Secondary | ICD-10-CM | POA: Diagnosis not present

## 2021-09-09 DIAGNOSIS — E11622 Type 2 diabetes mellitus with other skin ulcer: Secondary | ICD-10-CM | POA: Diagnosis not present

## 2021-09-09 DIAGNOSIS — L97328 Non-pressure chronic ulcer of left ankle with other specified severity: Secondary | ICD-10-CM | POA: Diagnosis not present

## 2021-09-09 DIAGNOSIS — I739 Peripheral vascular disease, unspecified: Secondary | ICD-10-CM | POA: Diagnosis not present

## 2021-09-09 DIAGNOSIS — R531 Weakness: Secondary | ICD-10-CM | POA: Diagnosis not present

## 2021-09-09 DIAGNOSIS — E1151 Type 2 diabetes mellitus with diabetic peripheral angiopathy without gangrene: Secondary | ICD-10-CM | POA: Diagnosis not present

## 2021-09-12 DIAGNOSIS — I739 Peripheral vascular disease, unspecified: Secondary | ICD-10-CM | POA: Diagnosis not present

## 2021-09-12 DIAGNOSIS — L97328 Non-pressure chronic ulcer of left ankle with other specified severity: Secondary | ICD-10-CM | POA: Diagnosis not present

## 2021-09-12 DIAGNOSIS — R531 Weakness: Secondary | ICD-10-CM | POA: Diagnosis not present

## 2021-09-12 DIAGNOSIS — E1151 Type 2 diabetes mellitus with diabetic peripheral angiopathy without gangrene: Secondary | ICD-10-CM | POA: Diagnosis not present

## 2021-09-12 DIAGNOSIS — L89893 Pressure ulcer of other site, stage 3: Secondary | ICD-10-CM | POA: Diagnosis not present

## 2021-09-12 DIAGNOSIS — J449 Chronic obstructive pulmonary disease, unspecified: Secondary | ICD-10-CM | POA: Diagnosis not present

## 2021-09-12 DIAGNOSIS — R2681 Unsteadiness on feet: Secondary | ICD-10-CM | POA: Diagnosis not present

## 2021-09-12 DIAGNOSIS — E11622 Type 2 diabetes mellitus with other skin ulcer: Secondary | ICD-10-CM | POA: Diagnosis not present

## 2021-09-12 DIAGNOSIS — J9611 Chronic respiratory failure with hypoxia: Secondary | ICD-10-CM | POA: Diagnosis not present

## 2021-09-16 DIAGNOSIS — I739 Peripheral vascular disease, unspecified: Secondary | ICD-10-CM | POA: Diagnosis not present

## 2021-09-16 DIAGNOSIS — J449 Chronic obstructive pulmonary disease, unspecified: Secondary | ICD-10-CM | POA: Diagnosis not present

## 2021-09-16 DIAGNOSIS — L97328 Non-pressure chronic ulcer of left ankle with other specified severity: Secondary | ICD-10-CM | POA: Diagnosis not present

## 2021-09-16 DIAGNOSIS — R2681 Unsteadiness on feet: Secondary | ICD-10-CM | POA: Diagnosis not present

## 2021-09-16 DIAGNOSIS — J9611 Chronic respiratory failure with hypoxia: Secondary | ICD-10-CM | POA: Diagnosis not present

## 2021-09-16 DIAGNOSIS — E11622 Type 2 diabetes mellitus with other skin ulcer: Secondary | ICD-10-CM | POA: Diagnosis not present

## 2021-09-16 DIAGNOSIS — L89893 Pressure ulcer of other site, stage 3: Secondary | ICD-10-CM | POA: Diagnosis not present

## 2021-09-16 DIAGNOSIS — R531 Weakness: Secondary | ICD-10-CM | POA: Diagnosis not present

## 2021-09-16 DIAGNOSIS — E1151 Type 2 diabetes mellitus with diabetic peripheral angiopathy without gangrene: Secondary | ICD-10-CM | POA: Diagnosis not present

## 2021-09-18 DIAGNOSIS — L97328 Non-pressure chronic ulcer of left ankle with other specified severity: Secondary | ICD-10-CM | POA: Diagnosis not present

## 2021-09-19 DIAGNOSIS — R531 Weakness: Secondary | ICD-10-CM | POA: Diagnosis not present

## 2021-09-19 DIAGNOSIS — E1151 Type 2 diabetes mellitus with diabetic peripheral angiopathy without gangrene: Secondary | ICD-10-CM | POA: Diagnosis not present

## 2021-09-19 DIAGNOSIS — J9611 Chronic respiratory failure with hypoxia: Secondary | ICD-10-CM | POA: Diagnosis not present

## 2021-09-19 DIAGNOSIS — J449 Chronic obstructive pulmonary disease, unspecified: Secondary | ICD-10-CM | POA: Diagnosis not present

## 2021-09-19 DIAGNOSIS — L89893 Pressure ulcer of other site, stage 3: Secondary | ICD-10-CM | POA: Diagnosis not present

## 2021-09-19 DIAGNOSIS — L97328 Non-pressure chronic ulcer of left ankle with other specified severity: Secondary | ICD-10-CM | POA: Diagnosis not present

## 2021-09-19 DIAGNOSIS — E11622 Type 2 diabetes mellitus with other skin ulcer: Secondary | ICD-10-CM | POA: Diagnosis not present

## 2021-09-19 DIAGNOSIS — I739 Peripheral vascular disease, unspecified: Secondary | ICD-10-CM | POA: Diagnosis not present

## 2021-09-19 DIAGNOSIS — R2681 Unsteadiness on feet: Secondary | ICD-10-CM | POA: Diagnosis not present

## 2021-09-22 DIAGNOSIS — E1139 Type 2 diabetes mellitus with other diabetic ophthalmic complication: Secondary | ICD-10-CM | POA: Diagnosis not present

## 2021-09-22 DIAGNOSIS — L97328 Non-pressure chronic ulcer of left ankle with other specified severity: Secondary | ICD-10-CM | POA: Diagnosis not present

## 2021-09-22 DIAGNOSIS — E1165 Type 2 diabetes mellitus with hyperglycemia: Secondary | ICD-10-CM | POA: Diagnosis not present

## 2021-09-22 DIAGNOSIS — I10 Essential (primary) hypertension: Secondary | ICD-10-CM | POA: Diagnosis not present

## 2021-09-22 DIAGNOSIS — E11621 Type 2 diabetes mellitus with foot ulcer: Secondary | ICD-10-CM | POA: Diagnosis not present

## 2021-09-24 DIAGNOSIS — J9611 Chronic respiratory failure with hypoxia: Secondary | ICD-10-CM | POA: Diagnosis not present

## 2021-09-24 DIAGNOSIS — J449 Chronic obstructive pulmonary disease, unspecified: Secondary | ICD-10-CM | POA: Diagnosis not present

## 2021-09-24 DIAGNOSIS — R531 Weakness: Secondary | ICD-10-CM | POA: Diagnosis not present

## 2021-09-24 DIAGNOSIS — I739 Peripheral vascular disease, unspecified: Secondary | ICD-10-CM | POA: Diagnosis not present

## 2021-09-24 DIAGNOSIS — I6350 Cerebral infarction due to unspecified occlusion or stenosis of unspecified cerebral artery: Secondary | ICD-10-CM | POA: Diagnosis not present

## 2021-09-24 DIAGNOSIS — E11622 Type 2 diabetes mellitus with other skin ulcer: Secondary | ICD-10-CM | POA: Diagnosis not present

## 2021-09-24 DIAGNOSIS — L89893 Pressure ulcer of other site, stage 3: Secondary | ICD-10-CM | POA: Diagnosis not present

## 2021-09-24 DIAGNOSIS — L97328 Non-pressure chronic ulcer of left ankle with other specified severity: Secondary | ICD-10-CM | POA: Diagnosis not present

## 2021-09-24 DIAGNOSIS — R2681 Unsteadiness on feet: Secondary | ICD-10-CM | POA: Diagnosis not present

## 2021-09-24 DIAGNOSIS — E1151 Type 2 diabetes mellitus with diabetic peripheral angiopathy without gangrene: Secondary | ICD-10-CM | POA: Diagnosis not present

## 2021-09-26 DIAGNOSIS — J439 Emphysema, unspecified: Secondary | ICD-10-CM | POA: Diagnosis not present

## 2021-09-26 DIAGNOSIS — E11622 Type 2 diabetes mellitus with other skin ulcer: Secondary | ICD-10-CM | POA: Diagnosis not present

## 2021-09-26 DIAGNOSIS — R531 Weakness: Secondary | ICD-10-CM | POA: Diagnosis not present

## 2021-09-26 DIAGNOSIS — L97328 Non-pressure chronic ulcer of left ankle with other specified severity: Secondary | ICD-10-CM | POA: Diagnosis not present

## 2021-09-26 DIAGNOSIS — E1151 Type 2 diabetes mellitus with diabetic peripheral angiopathy without gangrene: Secondary | ICD-10-CM | POA: Diagnosis not present

## 2021-09-26 DIAGNOSIS — L89893 Pressure ulcer of other site, stage 3: Secondary | ICD-10-CM | POA: Diagnosis not present

## 2021-09-26 DIAGNOSIS — J449 Chronic obstructive pulmonary disease, unspecified: Secondary | ICD-10-CM | POA: Diagnosis not present

## 2021-09-26 DIAGNOSIS — I739 Peripheral vascular disease, unspecified: Secondary | ICD-10-CM | POA: Diagnosis not present

## 2021-09-26 DIAGNOSIS — J9611 Chronic respiratory failure with hypoxia: Secondary | ICD-10-CM | POA: Diagnosis not present

## 2021-09-26 DIAGNOSIS — R2681 Unsteadiness on feet: Secondary | ICD-10-CM | POA: Diagnosis not present

## 2021-09-30 DIAGNOSIS — E1151 Type 2 diabetes mellitus with diabetic peripheral angiopathy without gangrene: Secondary | ICD-10-CM | POA: Diagnosis not present

## 2021-09-30 DIAGNOSIS — R2681 Unsteadiness on feet: Secondary | ICD-10-CM | POA: Diagnosis not present

## 2021-09-30 DIAGNOSIS — L97328 Non-pressure chronic ulcer of left ankle with other specified severity: Secondary | ICD-10-CM | POA: Diagnosis not present

## 2021-09-30 DIAGNOSIS — E11622 Type 2 diabetes mellitus with other skin ulcer: Secondary | ICD-10-CM | POA: Diagnosis not present

## 2021-09-30 DIAGNOSIS — L89893 Pressure ulcer of other site, stage 3: Secondary | ICD-10-CM | POA: Diagnosis not present

## 2021-09-30 DIAGNOSIS — R531 Weakness: Secondary | ICD-10-CM | POA: Diagnosis not present

## 2021-09-30 DIAGNOSIS — J449 Chronic obstructive pulmonary disease, unspecified: Secondary | ICD-10-CM | POA: Diagnosis not present

## 2021-09-30 DIAGNOSIS — J9611 Chronic respiratory failure with hypoxia: Secondary | ICD-10-CM | POA: Diagnosis not present

## 2021-09-30 DIAGNOSIS — I739 Peripheral vascular disease, unspecified: Secondary | ICD-10-CM | POA: Diagnosis not present

## 2021-10-02 DIAGNOSIS — J449 Chronic obstructive pulmonary disease, unspecified: Secondary | ICD-10-CM | POA: Diagnosis not present

## 2021-10-02 DIAGNOSIS — E11622 Type 2 diabetes mellitus with other skin ulcer: Secondary | ICD-10-CM | POA: Diagnosis not present

## 2021-10-02 DIAGNOSIS — I739 Peripheral vascular disease, unspecified: Secondary | ICD-10-CM | POA: Diagnosis not present

## 2021-10-02 DIAGNOSIS — L97328 Non-pressure chronic ulcer of left ankle with other specified severity: Secondary | ICD-10-CM | POA: Diagnosis not present

## 2021-10-02 DIAGNOSIS — R2681 Unsteadiness on feet: Secondary | ICD-10-CM | POA: Diagnosis not present

## 2021-10-02 DIAGNOSIS — L89893 Pressure ulcer of other site, stage 3: Secondary | ICD-10-CM | POA: Diagnosis not present

## 2021-10-02 DIAGNOSIS — J9611 Chronic respiratory failure with hypoxia: Secondary | ICD-10-CM | POA: Diagnosis not present

## 2021-10-02 DIAGNOSIS — R531 Weakness: Secondary | ICD-10-CM | POA: Diagnosis not present

## 2021-10-02 DIAGNOSIS — E1151 Type 2 diabetes mellitus with diabetic peripheral angiopathy without gangrene: Secondary | ICD-10-CM | POA: Diagnosis not present

## 2021-10-07 DIAGNOSIS — E1151 Type 2 diabetes mellitus with diabetic peripheral angiopathy without gangrene: Secondary | ICD-10-CM | POA: Diagnosis not present

## 2021-10-07 DIAGNOSIS — I739 Peripheral vascular disease, unspecified: Secondary | ICD-10-CM | POA: Diagnosis not present

## 2021-10-07 DIAGNOSIS — J449 Chronic obstructive pulmonary disease, unspecified: Secondary | ICD-10-CM | POA: Diagnosis not present

## 2021-10-07 DIAGNOSIS — J9611 Chronic respiratory failure with hypoxia: Secondary | ICD-10-CM | POA: Diagnosis not present

## 2021-10-07 DIAGNOSIS — R2681 Unsteadiness on feet: Secondary | ICD-10-CM | POA: Diagnosis not present

## 2021-10-07 DIAGNOSIS — E11622 Type 2 diabetes mellitus with other skin ulcer: Secondary | ICD-10-CM | POA: Diagnosis not present

## 2021-10-07 DIAGNOSIS — L89893 Pressure ulcer of other site, stage 3: Secondary | ICD-10-CM | POA: Diagnosis not present

## 2021-10-07 DIAGNOSIS — R531 Weakness: Secondary | ICD-10-CM | POA: Diagnosis not present

## 2021-10-07 DIAGNOSIS — L97328 Non-pressure chronic ulcer of left ankle with other specified severity: Secondary | ICD-10-CM | POA: Diagnosis not present

## 2021-10-09 DIAGNOSIS — E1151 Type 2 diabetes mellitus with diabetic peripheral angiopathy without gangrene: Secondary | ICD-10-CM | POA: Diagnosis not present

## 2021-10-09 DIAGNOSIS — I739 Peripheral vascular disease, unspecified: Secondary | ICD-10-CM | POA: Diagnosis not present

## 2021-10-09 DIAGNOSIS — L89893 Pressure ulcer of other site, stage 3: Secondary | ICD-10-CM | POA: Diagnosis not present

## 2021-10-09 DIAGNOSIS — E11622 Type 2 diabetes mellitus with other skin ulcer: Secondary | ICD-10-CM | POA: Diagnosis not present

## 2021-10-09 DIAGNOSIS — R2681 Unsteadiness on feet: Secondary | ICD-10-CM | POA: Diagnosis not present

## 2021-10-09 DIAGNOSIS — J449 Chronic obstructive pulmonary disease, unspecified: Secondary | ICD-10-CM | POA: Diagnosis not present

## 2021-10-09 DIAGNOSIS — R531 Weakness: Secondary | ICD-10-CM | POA: Diagnosis not present

## 2021-10-09 DIAGNOSIS — J9611 Chronic respiratory failure with hypoxia: Secondary | ICD-10-CM | POA: Diagnosis not present

## 2021-10-09 DIAGNOSIS — L97328 Non-pressure chronic ulcer of left ankle with other specified severity: Secondary | ICD-10-CM | POA: Diagnosis not present

## 2021-10-15 DIAGNOSIS — L89893 Pressure ulcer of other site, stage 3: Secondary | ICD-10-CM | POA: Diagnosis not present

## 2021-10-15 DIAGNOSIS — E11622 Type 2 diabetes mellitus with other skin ulcer: Secondary | ICD-10-CM | POA: Diagnosis not present

## 2021-10-15 DIAGNOSIS — L97328 Non-pressure chronic ulcer of left ankle with other specified severity: Secondary | ICD-10-CM | POA: Diagnosis not present

## 2021-10-15 DIAGNOSIS — I739 Peripheral vascular disease, unspecified: Secondary | ICD-10-CM | POA: Diagnosis not present

## 2021-10-15 DIAGNOSIS — R2681 Unsteadiness on feet: Secondary | ICD-10-CM | POA: Diagnosis not present

## 2021-10-15 DIAGNOSIS — R531 Weakness: Secondary | ICD-10-CM | POA: Diagnosis not present

## 2021-10-15 DIAGNOSIS — J449 Chronic obstructive pulmonary disease, unspecified: Secondary | ICD-10-CM | POA: Diagnosis not present

## 2021-10-15 DIAGNOSIS — J9611 Chronic respiratory failure with hypoxia: Secondary | ICD-10-CM | POA: Diagnosis not present

## 2021-10-15 DIAGNOSIS — E1151 Type 2 diabetes mellitus with diabetic peripheral angiopathy without gangrene: Secondary | ICD-10-CM | POA: Diagnosis not present

## 2021-10-17 DIAGNOSIS — J9611 Chronic respiratory failure with hypoxia: Secondary | ICD-10-CM | POA: Diagnosis not present

## 2021-10-17 DIAGNOSIS — L97328 Non-pressure chronic ulcer of left ankle with other specified severity: Secondary | ICD-10-CM | POA: Diagnosis not present

## 2021-10-17 DIAGNOSIS — I739 Peripheral vascular disease, unspecified: Secondary | ICD-10-CM | POA: Diagnosis not present

## 2021-10-17 DIAGNOSIS — R531 Weakness: Secondary | ICD-10-CM | POA: Diagnosis not present

## 2021-10-17 DIAGNOSIS — E11622 Type 2 diabetes mellitus with other skin ulcer: Secondary | ICD-10-CM | POA: Diagnosis not present

## 2021-10-17 DIAGNOSIS — E1151 Type 2 diabetes mellitus with diabetic peripheral angiopathy without gangrene: Secondary | ICD-10-CM | POA: Diagnosis not present

## 2021-10-17 DIAGNOSIS — J449 Chronic obstructive pulmonary disease, unspecified: Secondary | ICD-10-CM | POA: Diagnosis not present

## 2021-10-17 DIAGNOSIS — R2681 Unsteadiness on feet: Secondary | ICD-10-CM | POA: Diagnosis not present

## 2021-10-17 DIAGNOSIS — L89893 Pressure ulcer of other site, stage 3: Secondary | ICD-10-CM | POA: Diagnosis not present

## 2021-10-20 DIAGNOSIS — L97328 Non-pressure chronic ulcer of left ankle with other specified severity: Secondary | ICD-10-CM | POA: Diagnosis not present

## 2021-10-21 DIAGNOSIS — I739 Peripheral vascular disease, unspecified: Secondary | ICD-10-CM | POA: Diagnosis not present

## 2021-10-21 DIAGNOSIS — J449 Chronic obstructive pulmonary disease, unspecified: Secondary | ICD-10-CM | POA: Diagnosis not present

## 2021-10-21 DIAGNOSIS — R2681 Unsteadiness on feet: Secondary | ICD-10-CM | POA: Diagnosis not present

## 2021-10-21 DIAGNOSIS — L89893 Pressure ulcer of other site, stage 3: Secondary | ICD-10-CM | POA: Diagnosis not present

## 2021-10-21 DIAGNOSIS — E1151 Type 2 diabetes mellitus with diabetic peripheral angiopathy without gangrene: Secondary | ICD-10-CM | POA: Diagnosis not present

## 2021-10-21 DIAGNOSIS — R531 Weakness: Secondary | ICD-10-CM | POA: Diagnosis not present

## 2021-10-21 DIAGNOSIS — J9611 Chronic respiratory failure with hypoxia: Secondary | ICD-10-CM | POA: Diagnosis not present

## 2021-10-21 DIAGNOSIS — L97328 Non-pressure chronic ulcer of left ankle with other specified severity: Secondary | ICD-10-CM | POA: Diagnosis not present

## 2021-10-21 DIAGNOSIS — E11622 Type 2 diabetes mellitus with other skin ulcer: Secondary | ICD-10-CM | POA: Diagnosis not present

## 2021-10-23 DIAGNOSIS — E11622 Type 2 diabetes mellitus with other skin ulcer: Secondary | ICD-10-CM | POA: Diagnosis not present

## 2021-10-23 DIAGNOSIS — J9611 Chronic respiratory failure with hypoxia: Secondary | ICD-10-CM | POA: Diagnosis not present

## 2021-10-23 DIAGNOSIS — L97328 Non-pressure chronic ulcer of left ankle with other specified severity: Secondary | ICD-10-CM | POA: Diagnosis not present

## 2021-10-23 DIAGNOSIS — E1151 Type 2 diabetes mellitus with diabetic peripheral angiopathy without gangrene: Secondary | ICD-10-CM | POA: Diagnosis not present

## 2021-10-23 DIAGNOSIS — R531 Weakness: Secondary | ICD-10-CM | POA: Diagnosis not present

## 2021-10-23 DIAGNOSIS — R2681 Unsteadiness on feet: Secondary | ICD-10-CM | POA: Diagnosis not present

## 2021-10-23 DIAGNOSIS — J449 Chronic obstructive pulmonary disease, unspecified: Secondary | ICD-10-CM | POA: Diagnosis not present

## 2021-10-23 DIAGNOSIS — I10 Essential (primary) hypertension: Secondary | ICD-10-CM | POA: Diagnosis not present

## 2021-10-23 DIAGNOSIS — I739 Peripheral vascular disease, unspecified: Secondary | ICD-10-CM | POA: Diagnosis not present

## 2021-10-23 DIAGNOSIS — L89893 Pressure ulcer of other site, stage 3: Secondary | ICD-10-CM | POA: Diagnosis not present

## 2021-10-23 DIAGNOSIS — E1165 Type 2 diabetes mellitus with hyperglycemia: Secondary | ICD-10-CM | POA: Diagnosis not present

## 2021-10-24 DIAGNOSIS — L97328 Non-pressure chronic ulcer of left ankle with other specified severity: Secondary | ICD-10-CM | POA: Diagnosis not present

## 2021-10-24 DIAGNOSIS — L89893 Pressure ulcer of other site, stage 3: Secondary | ICD-10-CM | POA: Diagnosis not present

## 2021-10-27 DIAGNOSIS — R531 Weakness: Secondary | ICD-10-CM | POA: Diagnosis not present

## 2021-10-27 DIAGNOSIS — J439 Emphysema, unspecified: Secondary | ICD-10-CM | POA: Diagnosis not present

## 2021-10-28 DIAGNOSIS — L89893 Pressure ulcer of other site, stage 3: Secondary | ICD-10-CM | POA: Diagnosis not present

## 2021-10-28 DIAGNOSIS — L97328 Non-pressure chronic ulcer of left ankle with other specified severity: Secondary | ICD-10-CM | POA: Diagnosis not present

## 2021-10-28 DIAGNOSIS — R531 Weakness: Secondary | ICD-10-CM | POA: Diagnosis not present

## 2021-10-28 DIAGNOSIS — I739 Peripheral vascular disease, unspecified: Secondary | ICD-10-CM | POA: Diagnosis not present

## 2021-10-28 DIAGNOSIS — R2681 Unsteadiness on feet: Secondary | ICD-10-CM | POA: Diagnosis not present

## 2021-10-28 DIAGNOSIS — J449 Chronic obstructive pulmonary disease, unspecified: Secondary | ICD-10-CM | POA: Diagnosis not present

## 2021-10-28 DIAGNOSIS — E11622 Type 2 diabetes mellitus with other skin ulcer: Secondary | ICD-10-CM | POA: Diagnosis not present

## 2021-10-28 DIAGNOSIS — J9611 Chronic respiratory failure with hypoxia: Secondary | ICD-10-CM | POA: Diagnosis not present

## 2021-10-28 DIAGNOSIS — E1151 Type 2 diabetes mellitus with diabetic peripheral angiopathy without gangrene: Secondary | ICD-10-CM | POA: Diagnosis not present

## 2021-10-30 DIAGNOSIS — I739 Peripheral vascular disease, unspecified: Secondary | ICD-10-CM | POA: Diagnosis not present

## 2021-10-30 DIAGNOSIS — L89893 Pressure ulcer of other site, stage 3: Secondary | ICD-10-CM | POA: Diagnosis not present

## 2021-10-30 DIAGNOSIS — L97328 Non-pressure chronic ulcer of left ankle with other specified severity: Secondary | ICD-10-CM | POA: Diagnosis not present

## 2021-10-30 DIAGNOSIS — J9611 Chronic respiratory failure with hypoxia: Secondary | ICD-10-CM | POA: Diagnosis not present

## 2021-10-30 DIAGNOSIS — R531 Weakness: Secondary | ICD-10-CM | POA: Diagnosis not present

## 2021-10-30 DIAGNOSIS — R2681 Unsteadiness on feet: Secondary | ICD-10-CM | POA: Diagnosis not present

## 2021-10-30 DIAGNOSIS — E1151 Type 2 diabetes mellitus with diabetic peripheral angiopathy without gangrene: Secondary | ICD-10-CM | POA: Diagnosis not present

## 2021-10-30 DIAGNOSIS — J449 Chronic obstructive pulmonary disease, unspecified: Secondary | ICD-10-CM | POA: Diagnosis not present

## 2021-10-30 DIAGNOSIS — E11622 Type 2 diabetes mellitus with other skin ulcer: Secondary | ICD-10-CM | POA: Diagnosis not present

## 2021-11-04 DIAGNOSIS — J9611 Chronic respiratory failure with hypoxia: Secondary | ICD-10-CM | POA: Diagnosis not present

## 2021-11-04 DIAGNOSIS — R2681 Unsteadiness on feet: Secondary | ICD-10-CM | POA: Diagnosis not present

## 2021-11-04 DIAGNOSIS — J449 Chronic obstructive pulmonary disease, unspecified: Secondary | ICD-10-CM | POA: Diagnosis not present

## 2021-11-04 DIAGNOSIS — L89893 Pressure ulcer of other site, stage 3: Secondary | ICD-10-CM | POA: Diagnosis not present

## 2021-11-04 DIAGNOSIS — R531 Weakness: Secondary | ICD-10-CM | POA: Diagnosis not present

## 2021-11-04 DIAGNOSIS — I739 Peripheral vascular disease, unspecified: Secondary | ICD-10-CM | POA: Diagnosis not present

## 2021-11-04 DIAGNOSIS — L97328 Non-pressure chronic ulcer of left ankle with other specified severity: Secondary | ICD-10-CM | POA: Diagnosis not present

## 2021-11-04 DIAGNOSIS — E11622 Type 2 diabetes mellitus with other skin ulcer: Secondary | ICD-10-CM | POA: Diagnosis not present

## 2021-11-04 DIAGNOSIS — E1151 Type 2 diabetes mellitus with diabetic peripheral angiopathy without gangrene: Secondary | ICD-10-CM | POA: Diagnosis not present

## 2021-11-06 DIAGNOSIS — J9611 Chronic respiratory failure with hypoxia: Secondary | ICD-10-CM | POA: Diagnosis not present

## 2021-11-06 DIAGNOSIS — R531 Weakness: Secondary | ICD-10-CM | POA: Diagnosis not present

## 2021-11-06 DIAGNOSIS — I739 Peripheral vascular disease, unspecified: Secondary | ICD-10-CM | POA: Diagnosis not present

## 2021-11-06 DIAGNOSIS — L89893 Pressure ulcer of other site, stage 3: Secondary | ICD-10-CM | POA: Diagnosis not present

## 2021-11-06 DIAGNOSIS — J449 Chronic obstructive pulmonary disease, unspecified: Secondary | ICD-10-CM | POA: Diagnosis not present

## 2021-11-06 DIAGNOSIS — R2681 Unsteadiness on feet: Secondary | ICD-10-CM | POA: Diagnosis not present

## 2021-11-06 DIAGNOSIS — L97328 Non-pressure chronic ulcer of left ankle with other specified severity: Secondary | ICD-10-CM | POA: Diagnosis not present

## 2021-11-06 DIAGNOSIS — E11622 Type 2 diabetes mellitus with other skin ulcer: Secondary | ICD-10-CM | POA: Diagnosis not present

## 2021-11-06 DIAGNOSIS — E1151 Type 2 diabetes mellitus with diabetic peripheral angiopathy without gangrene: Secondary | ICD-10-CM | POA: Diagnosis not present

## 2021-11-11 DIAGNOSIS — E11622 Type 2 diabetes mellitus with other skin ulcer: Secondary | ICD-10-CM | POA: Diagnosis not present

## 2021-11-11 DIAGNOSIS — J449 Chronic obstructive pulmonary disease, unspecified: Secondary | ICD-10-CM | POA: Diagnosis not present

## 2021-11-11 DIAGNOSIS — L89893 Pressure ulcer of other site, stage 3: Secondary | ICD-10-CM | POA: Diagnosis not present

## 2021-11-11 DIAGNOSIS — R531 Weakness: Secondary | ICD-10-CM | POA: Diagnosis not present

## 2021-11-11 DIAGNOSIS — E1151 Type 2 diabetes mellitus with diabetic peripheral angiopathy without gangrene: Secondary | ICD-10-CM | POA: Diagnosis not present

## 2021-11-11 DIAGNOSIS — R2681 Unsteadiness on feet: Secondary | ICD-10-CM | POA: Diagnosis not present

## 2021-11-11 DIAGNOSIS — I739 Peripheral vascular disease, unspecified: Secondary | ICD-10-CM | POA: Diagnosis not present

## 2021-11-11 DIAGNOSIS — J9611 Chronic respiratory failure with hypoxia: Secondary | ICD-10-CM | POA: Diagnosis not present

## 2021-11-11 DIAGNOSIS — L97328 Non-pressure chronic ulcer of left ankle with other specified severity: Secondary | ICD-10-CM | POA: Diagnosis not present

## 2021-11-14 DIAGNOSIS — R531 Weakness: Secondary | ICD-10-CM | POA: Diagnosis not present

## 2021-11-14 DIAGNOSIS — J9611 Chronic respiratory failure with hypoxia: Secondary | ICD-10-CM | POA: Diagnosis not present

## 2021-11-14 DIAGNOSIS — I739 Peripheral vascular disease, unspecified: Secondary | ICD-10-CM | POA: Diagnosis not present

## 2021-11-14 DIAGNOSIS — L97328 Non-pressure chronic ulcer of left ankle with other specified severity: Secondary | ICD-10-CM | POA: Diagnosis not present

## 2021-11-14 DIAGNOSIS — L89893 Pressure ulcer of other site, stage 3: Secondary | ICD-10-CM | POA: Diagnosis not present

## 2021-11-14 DIAGNOSIS — J449 Chronic obstructive pulmonary disease, unspecified: Secondary | ICD-10-CM | POA: Diagnosis not present

## 2021-11-14 DIAGNOSIS — E1151 Type 2 diabetes mellitus with diabetic peripheral angiopathy without gangrene: Secondary | ICD-10-CM | POA: Diagnosis not present

## 2021-11-14 DIAGNOSIS — R2681 Unsteadiness on feet: Secondary | ICD-10-CM | POA: Diagnosis not present

## 2021-11-14 DIAGNOSIS — E11622 Type 2 diabetes mellitus with other skin ulcer: Secondary | ICD-10-CM | POA: Diagnosis not present

## 2021-11-18 DIAGNOSIS — R531 Weakness: Secondary | ICD-10-CM | POA: Diagnosis not present

## 2021-11-18 DIAGNOSIS — E1151 Type 2 diabetes mellitus with diabetic peripheral angiopathy without gangrene: Secondary | ICD-10-CM | POA: Diagnosis not present

## 2021-11-18 DIAGNOSIS — J9611 Chronic respiratory failure with hypoxia: Secondary | ICD-10-CM | POA: Diagnosis not present

## 2021-11-18 DIAGNOSIS — L89893 Pressure ulcer of other site, stage 3: Secondary | ICD-10-CM | POA: Diagnosis not present

## 2021-11-18 DIAGNOSIS — I739 Peripheral vascular disease, unspecified: Secondary | ICD-10-CM | POA: Diagnosis not present

## 2021-11-18 DIAGNOSIS — L97328 Non-pressure chronic ulcer of left ankle with other specified severity: Secondary | ICD-10-CM | POA: Diagnosis not present

## 2021-11-18 DIAGNOSIS — J449 Chronic obstructive pulmonary disease, unspecified: Secondary | ICD-10-CM | POA: Diagnosis not present

## 2021-11-18 DIAGNOSIS — R2681 Unsteadiness on feet: Secondary | ICD-10-CM | POA: Diagnosis not present

## 2021-11-18 DIAGNOSIS — E11622 Type 2 diabetes mellitus with other skin ulcer: Secondary | ICD-10-CM | POA: Diagnosis not present

## 2021-11-20 DIAGNOSIS — J449 Chronic obstructive pulmonary disease, unspecified: Secondary | ICD-10-CM | POA: Diagnosis not present

## 2021-11-20 DIAGNOSIS — E1151 Type 2 diabetes mellitus with diabetic peripheral angiopathy without gangrene: Secondary | ICD-10-CM | POA: Diagnosis not present

## 2021-11-20 DIAGNOSIS — E11622 Type 2 diabetes mellitus with other skin ulcer: Secondary | ICD-10-CM | POA: Diagnosis not present

## 2021-11-20 DIAGNOSIS — L97328 Non-pressure chronic ulcer of left ankle with other specified severity: Secondary | ICD-10-CM | POA: Diagnosis not present

## 2021-11-20 DIAGNOSIS — I739 Peripheral vascular disease, unspecified: Secondary | ICD-10-CM | POA: Diagnosis not present

## 2021-11-20 DIAGNOSIS — R531 Weakness: Secondary | ICD-10-CM | POA: Diagnosis not present

## 2021-11-20 DIAGNOSIS — R2681 Unsteadiness on feet: Secondary | ICD-10-CM | POA: Diagnosis not present

## 2021-11-20 DIAGNOSIS — L89893 Pressure ulcer of other site, stage 3: Secondary | ICD-10-CM | POA: Diagnosis not present

## 2021-11-20 DIAGNOSIS — J9611 Chronic respiratory failure with hypoxia: Secondary | ICD-10-CM | POA: Diagnosis not present

## 2021-11-21 DIAGNOSIS — E1139 Type 2 diabetes mellitus with other diabetic ophthalmic complication: Secondary | ICD-10-CM | POA: Diagnosis not present

## 2021-11-21 DIAGNOSIS — I10 Essential (primary) hypertension: Secondary | ICD-10-CM | POA: Diagnosis not present

## 2021-11-21 DIAGNOSIS — J449 Chronic obstructive pulmonary disease, unspecified: Secondary | ICD-10-CM | POA: Diagnosis not present

## 2021-11-24 DIAGNOSIS — I739 Peripheral vascular disease, unspecified: Secondary | ICD-10-CM | POA: Diagnosis not present

## 2021-11-24 DIAGNOSIS — E1151 Type 2 diabetes mellitus with diabetic peripheral angiopathy without gangrene: Secondary | ICD-10-CM | POA: Diagnosis not present

## 2021-11-24 DIAGNOSIS — E11622 Type 2 diabetes mellitus with other skin ulcer: Secondary | ICD-10-CM | POA: Diagnosis not present

## 2021-11-24 DIAGNOSIS — L97328 Non-pressure chronic ulcer of left ankle with other specified severity: Secondary | ICD-10-CM | POA: Diagnosis not present

## 2021-11-24 DIAGNOSIS — J9611 Chronic respiratory failure with hypoxia: Secondary | ICD-10-CM | POA: Diagnosis not present

## 2021-11-24 DIAGNOSIS — R2681 Unsteadiness on feet: Secondary | ICD-10-CM | POA: Diagnosis not present

## 2021-11-24 DIAGNOSIS — L89893 Pressure ulcer of other site, stage 3: Secondary | ICD-10-CM | POA: Diagnosis not present

## 2021-11-24 DIAGNOSIS — R531 Weakness: Secondary | ICD-10-CM | POA: Diagnosis not present

## 2021-11-24 DIAGNOSIS — J449 Chronic obstructive pulmonary disease, unspecified: Secondary | ICD-10-CM | POA: Diagnosis not present

## 2021-11-26 DIAGNOSIS — J439 Emphysema, unspecified: Secondary | ICD-10-CM | POA: Diagnosis not present

## 2021-11-28 DIAGNOSIS — E11622 Type 2 diabetes mellitus with other skin ulcer: Secondary | ICD-10-CM | POA: Diagnosis not present

## 2021-11-28 DIAGNOSIS — L89893 Pressure ulcer of other site, stage 3: Secondary | ICD-10-CM | POA: Diagnosis not present

## 2021-11-28 DIAGNOSIS — I739 Peripheral vascular disease, unspecified: Secondary | ICD-10-CM | POA: Diagnosis not present

## 2021-11-28 DIAGNOSIS — L97328 Non-pressure chronic ulcer of left ankle with other specified severity: Secondary | ICD-10-CM | POA: Diagnosis not present

## 2021-11-28 DIAGNOSIS — R531 Weakness: Secondary | ICD-10-CM | POA: Diagnosis not present

## 2021-11-28 DIAGNOSIS — J9611 Chronic respiratory failure with hypoxia: Secondary | ICD-10-CM | POA: Diagnosis not present

## 2021-11-28 DIAGNOSIS — R2681 Unsteadiness on feet: Secondary | ICD-10-CM | POA: Diagnosis not present

## 2021-11-28 DIAGNOSIS — E1151 Type 2 diabetes mellitus with diabetic peripheral angiopathy without gangrene: Secondary | ICD-10-CM | POA: Diagnosis not present

## 2021-11-28 DIAGNOSIS — J449 Chronic obstructive pulmonary disease, unspecified: Secondary | ICD-10-CM | POA: Diagnosis not present

## 2021-12-02 ENCOUNTER — Encounter (HOSPITAL_BASED_OUTPATIENT_CLINIC_OR_DEPARTMENT_OTHER): Payer: Medicare Other | Attending: Internal Medicine | Admitting: Internal Medicine

## 2021-12-02 DIAGNOSIS — R2681 Unsteadiness on feet: Secondary | ICD-10-CM | POA: Diagnosis not present

## 2021-12-02 DIAGNOSIS — I739 Peripheral vascular disease, unspecified: Secondary | ICD-10-CM

## 2021-12-02 DIAGNOSIS — L89523 Pressure ulcer of left ankle, stage 3: Secondary | ICD-10-CM | POA: Diagnosis not present

## 2021-12-02 DIAGNOSIS — L89893 Pressure ulcer of other site, stage 3: Secondary | ICD-10-CM | POA: Insufficient documentation

## 2021-12-02 DIAGNOSIS — E1151 Type 2 diabetes mellitus with diabetic peripheral angiopathy without gangrene: Secondary | ICD-10-CM | POA: Diagnosis not present

## 2021-12-02 DIAGNOSIS — J449 Chronic obstructive pulmonary disease, unspecified: Secondary | ICD-10-CM | POA: Diagnosis not present

## 2021-12-02 DIAGNOSIS — I70243 Atherosclerosis of native arteries of left leg with ulceration of ankle: Secondary | ICD-10-CM | POA: Insufficient documentation

## 2021-12-02 DIAGNOSIS — L97328 Non-pressure chronic ulcer of left ankle with other specified severity: Secondary | ICD-10-CM | POA: Diagnosis not present

## 2021-12-02 DIAGNOSIS — E11621 Type 2 diabetes mellitus with foot ulcer: Secondary | ICD-10-CM | POA: Diagnosis not present

## 2021-12-02 DIAGNOSIS — I69354 Hemiplegia and hemiparesis following cerebral infarction affecting left non-dominant side: Secondary | ICD-10-CM | POA: Diagnosis not present

## 2021-12-02 DIAGNOSIS — E11622 Type 2 diabetes mellitus with other skin ulcer: Secondary | ICD-10-CM | POA: Diagnosis not present

## 2021-12-02 DIAGNOSIS — Z87891 Personal history of nicotine dependence: Secondary | ICD-10-CM | POA: Insufficient documentation

## 2021-12-02 DIAGNOSIS — F0150 Vascular dementia without behavioral disturbance: Secondary | ICD-10-CM | POA: Insufficient documentation

## 2021-12-02 DIAGNOSIS — J9611 Chronic respiratory failure with hypoxia: Secondary | ICD-10-CM | POA: Diagnosis not present

## 2021-12-02 DIAGNOSIS — R531 Weakness: Secondary | ICD-10-CM | POA: Diagnosis not present

## 2021-12-03 DIAGNOSIS — J449 Chronic obstructive pulmonary disease, unspecified: Secondary | ICD-10-CM | POA: Diagnosis not present

## 2021-12-03 DIAGNOSIS — J9611 Chronic respiratory failure with hypoxia: Secondary | ICD-10-CM | POA: Diagnosis not present

## 2021-12-03 DIAGNOSIS — L97328 Non-pressure chronic ulcer of left ankle with other specified severity: Secondary | ICD-10-CM | POA: Diagnosis not present

## 2021-12-03 DIAGNOSIS — E1151 Type 2 diabetes mellitus with diabetic peripheral angiopathy without gangrene: Secondary | ICD-10-CM | POA: Diagnosis not present

## 2021-12-03 DIAGNOSIS — Z7984 Long term (current) use of oral hypoglycemic drugs: Secondary | ICD-10-CM | POA: Diagnosis not present

## 2021-12-03 DIAGNOSIS — Z794 Long term (current) use of insulin: Secondary | ICD-10-CM | POA: Diagnosis not present

## 2021-12-03 DIAGNOSIS — R531 Weakness: Secondary | ICD-10-CM | POA: Diagnosis not present

## 2021-12-03 DIAGNOSIS — E11622 Type 2 diabetes mellitus with other skin ulcer: Secondary | ICD-10-CM | POA: Diagnosis not present

## 2021-12-03 DIAGNOSIS — L89893 Pressure ulcer of other site, stage 3: Secondary | ICD-10-CM | POA: Diagnosis not present

## 2021-12-03 DIAGNOSIS — I739 Peripheral vascular disease, unspecified: Secondary | ICD-10-CM | POA: Diagnosis not present

## 2021-12-04 DIAGNOSIS — R531 Weakness: Secondary | ICD-10-CM | POA: Diagnosis not present

## 2021-12-04 DIAGNOSIS — I6350 Cerebral infarction due to unspecified occlusion or stenosis of unspecified cerebral artery: Secondary | ICD-10-CM | POA: Diagnosis not present

## 2021-12-08 DIAGNOSIS — I739 Peripheral vascular disease, unspecified: Secondary | ICD-10-CM | POA: Diagnosis not present

## 2021-12-08 DIAGNOSIS — J9611 Chronic respiratory failure with hypoxia: Secondary | ICD-10-CM | POA: Diagnosis not present

## 2021-12-08 DIAGNOSIS — Z794 Long term (current) use of insulin: Secondary | ICD-10-CM | POA: Diagnosis not present

## 2021-12-08 DIAGNOSIS — R531 Weakness: Secondary | ICD-10-CM | POA: Diagnosis not present

## 2021-12-08 DIAGNOSIS — J449 Chronic obstructive pulmonary disease, unspecified: Secondary | ICD-10-CM | POA: Diagnosis not present

## 2021-12-08 DIAGNOSIS — L97328 Non-pressure chronic ulcer of left ankle with other specified severity: Secondary | ICD-10-CM | POA: Diagnosis not present

## 2021-12-08 DIAGNOSIS — E11622 Type 2 diabetes mellitus with other skin ulcer: Secondary | ICD-10-CM | POA: Diagnosis not present

## 2021-12-08 DIAGNOSIS — L89893 Pressure ulcer of other site, stage 3: Secondary | ICD-10-CM | POA: Diagnosis not present

## 2021-12-08 DIAGNOSIS — Z7984 Long term (current) use of oral hypoglycemic drugs: Secondary | ICD-10-CM | POA: Diagnosis not present

## 2021-12-08 DIAGNOSIS — E1151 Type 2 diabetes mellitus with diabetic peripheral angiopathy without gangrene: Secondary | ICD-10-CM | POA: Diagnosis not present

## 2021-12-11 DIAGNOSIS — E11622 Type 2 diabetes mellitus with other skin ulcer: Secondary | ICD-10-CM | POA: Diagnosis not present

## 2021-12-11 DIAGNOSIS — Z7984 Long term (current) use of oral hypoglycemic drugs: Secondary | ICD-10-CM | POA: Diagnosis not present

## 2021-12-11 DIAGNOSIS — J449 Chronic obstructive pulmonary disease, unspecified: Secondary | ICD-10-CM | POA: Diagnosis not present

## 2021-12-11 DIAGNOSIS — L97328 Non-pressure chronic ulcer of left ankle with other specified severity: Secondary | ICD-10-CM | POA: Diagnosis not present

## 2021-12-11 DIAGNOSIS — J9611 Chronic respiratory failure with hypoxia: Secondary | ICD-10-CM | POA: Diagnosis not present

## 2021-12-11 DIAGNOSIS — I739 Peripheral vascular disease, unspecified: Secondary | ICD-10-CM | POA: Diagnosis not present

## 2021-12-11 DIAGNOSIS — R531 Weakness: Secondary | ICD-10-CM | POA: Diagnosis not present

## 2021-12-11 DIAGNOSIS — Z794 Long term (current) use of insulin: Secondary | ICD-10-CM | POA: Diagnosis not present

## 2021-12-11 DIAGNOSIS — E1151 Type 2 diabetes mellitus with diabetic peripheral angiopathy without gangrene: Secondary | ICD-10-CM | POA: Diagnosis not present

## 2021-12-11 DIAGNOSIS — L89893 Pressure ulcer of other site, stage 3: Secondary | ICD-10-CM | POA: Diagnosis not present

## 2021-12-15 DIAGNOSIS — L89893 Pressure ulcer of other site, stage 3: Secondary | ICD-10-CM | POA: Diagnosis not present

## 2021-12-15 DIAGNOSIS — Z7984 Long term (current) use of oral hypoglycemic drugs: Secondary | ICD-10-CM | POA: Diagnosis not present

## 2021-12-15 DIAGNOSIS — I739 Peripheral vascular disease, unspecified: Secondary | ICD-10-CM | POA: Diagnosis not present

## 2021-12-15 DIAGNOSIS — J449 Chronic obstructive pulmonary disease, unspecified: Secondary | ICD-10-CM | POA: Diagnosis not present

## 2021-12-15 DIAGNOSIS — Z794 Long term (current) use of insulin: Secondary | ICD-10-CM | POA: Diagnosis not present

## 2021-12-15 DIAGNOSIS — E11622 Type 2 diabetes mellitus with other skin ulcer: Secondary | ICD-10-CM | POA: Diagnosis not present

## 2021-12-15 DIAGNOSIS — E1151 Type 2 diabetes mellitus with diabetic peripheral angiopathy without gangrene: Secondary | ICD-10-CM | POA: Diagnosis not present

## 2021-12-15 DIAGNOSIS — L97328 Non-pressure chronic ulcer of left ankle with other specified severity: Secondary | ICD-10-CM | POA: Diagnosis not present

## 2021-12-15 DIAGNOSIS — J9611 Chronic respiratory failure with hypoxia: Secondary | ICD-10-CM | POA: Diagnosis not present

## 2021-12-15 DIAGNOSIS — R531 Weakness: Secondary | ICD-10-CM | POA: Diagnosis not present

## 2021-12-16 ENCOUNTER — Encounter (HOSPITAL_BASED_OUTPATIENT_CLINIC_OR_DEPARTMENT_OTHER): Payer: Medicare Other | Admitting: Internal Medicine

## 2021-12-18 DIAGNOSIS — E1151 Type 2 diabetes mellitus with diabetic peripheral angiopathy without gangrene: Secondary | ICD-10-CM | POA: Diagnosis not present

## 2021-12-18 DIAGNOSIS — Z794 Long term (current) use of insulin: Secondary | ICD-10-CM | POA: Diagnosis not present

## 2021-12-18 DIAGNOSIS — E11622 Type 2 diabetes mellitus with other skin ulcer: Secondary | ICD-10-CM | POA: Diagnosis not present

## 2021-12-18 DIAGNOSIS — L89893 Pressure ulcer of other site, stage 3: Secondary | ICD-10-CM | POA: Diagnosis not present

## 2021-12-18 DIAGNOSIS — R531 Weakness: Secondary | ICD-10-CM | POA: Diagnosis not present

## 2021-12-18 DIAGNOSIS — J449 Chronic obstructive pulmonary disease, unspecified: Secondary | ICD-10-CM | POA: Diagnosis not present

## 2021-12-18 DIAGNOSIS — J9611 Chronic respiratory failure with hypoxia: Secondary | ICD-10-CM | POA: Diagnosis not present

## 2021-12-18 DIAGNOSIS — I739 Peripheral vascular disease, unspecified: Secondary | ICD-10-CM | POA: Diagnosis not present

## 2021-12-18 DIAGNOSIS — Z7984 Long term (current) use of oral hypoglycemic drugs: Secondary | ICD-10-CM | POA: Diagnosis not present

## 2021-12-18 DIAGNOSIS — L97328 Non-pressure chronic ulcer of left ankle with other specified severity: Secondary | ICD-10-CM | POA: Diagnosis not present

## 2021-12-19 DIAGNOSIS — I10 Essential (primary) hypertension: Secondary | ICD-10-CM | POA: Diagnosis not present

## 2021-12-19 DIAGNOSIS — J449 Chronic obstructive pulmonary disease, unspecified: Secondary | ICD-10-CM | POA: Diagnosis not present

## 2021-12-19 DIAGNOSIS — E1139 Type 2 diabetes mellitus with other diabetic ophthalmic complication: Secondary | ICD-10-CM | POA: Diagnosis not present

## 2021-12-23 DIAGNOSIS — Z794 Long term (current) use of insulin: Secondary | ICD-10-CM | POA: Diagnosis not present

## 2021-12-23 DIAGNOSIS — J9611 Chronic respiratory failure with hypoxia: Secondary | ICD-10-CM | POA: Diagnosis not present

## 2021-12-23 DIAGNOSIS — Z7984 Long term (current) use of oral hypoglycemic drugs: Secondary | ICD-10-CM | POA: Diagnosis not present

## 2021-12-23 DIAGNOSIS — R531 Weakness: Secondary | ICD-10-CM | POA: Diagnosis not present

## 2021-12-23 DIAGNOSIS — J449 Chronic obstructive pulmonary disease, unspecified: Secondary | ICD-10-CM | POA: Diagnosis not present

## 2021-12-23 DIAGNOSIS — I739 Peripheral vascular disease, unspecified: Secondary | ICD-10-CM | POA: Diagnosis not present

## 2021-12-23 DIAGNOSIS — L97328 Non-pressure chronic ulcer of left ankle with other specified severity: Secondary | ICD-10-CM | POA: Diagnosis not present

## 2021-12-23 DIAGNOSIS — E1151 Type 2 diabetes mellitus with diabetic peripheral angiopathy without gangrene: Secondary | ICD-10-CM | POA: Diagnosis not present

## 2021-12-23 DIAGNOSIS — L89893 Pressure ulcer of other site, stage 3: Secondary | ICD-10-CM | POA: Diagnosis not present

## 2021-12-23 DIAGNOSIS — E11622 Type 2 diabetes mellitus with other skin ulcer: Secondary | ICD-10-CM | POA: Diagnosis not present

## 2021-12-25 DIAGNOSIS — Z794 Long term (current) use of insulin: Secondary | ICD-10-CM | POA: Diagnosis not present

## 2021-12-25 DIAGNOSIS — L89893 Pressure ulcer of other site, stage 3: Secondary | ICD-10-CM | POA: Diagnosis not present

## 2021-12-25 DIAGNOSIS — L97328 Non-pressure chronic ulcer of left ankle with other specified severity: Secondary | ICD-10-CM | POA: Diagnosis not present

## 2021-12-25 DIAGNOSIS — J449 Chronic obstructive pulmonary disease, unspecified: Secondary | ICD-10-CM | POA: Diagnosis not present

## 2021-12-25 DIAGNOSIS — J9611 Chronic respiratory failure with hypoxia: Secondary | ICD-10-CM | POA: Diagnosis not present

## 2021-12-25 DIAGNOSIS — E11622 Type 2 diabetes mellitus with other skin ulcer: Secondary | ICD-10-CM | POA: Diagnosis not present

## 2021-12-25 DIAGNOSIS — Z7984 Long term (current) use of oral hypoglycemic drugs: Secondary | ICD-10-CM | POA: Diagnosis not present

## 2021-12-25 DIAGNOSIS — E1151 Type 2 diabetes mellitus with diabetic peripheral angiopathy without gangrene: Secondary | ICD-10-CM | POA: Diagnosis not present

## 2021-12-25 DIAGNOSIS — R531 Weakness: Secondary | ICD-10-CM | POA: Diagnosis not present

## 2021-12-25 DIAGNOSIS — I739 Peripheral vascular disease, unspecified: Secondary | ICD-10-CM | POA: Diagnosis not present

## 2021-12-25 DIAGNOSIS — I6350 Cerebral infarction due to unspecified occlusion or stenosis of unspecified cerebral artery: Secondary | ICD-10-CM | POA: Diagnosis not present

## 2021-12-27 DIAGNOSIS — J439 Emphysema, unspecified: Secondary | ICD-10-CM | POA: Diagnosis not present

## 2021-12-29 DIAGNOSIS — L97328 Non-pressure chronic ulcer of left ankle with other specified severity: Secondary | ICD-10-CM | POA: Diagnosis not present

## 2021-12-29 DIAGNOSIS — J9611 Chronic respiratory failure with hypoxia: Secondary | ICD-10-CM | POA: Diagnosis not present

## 2021-12-29 DIAGNOSIS — I739 Peripheral vascular disease, unspecified: Secondary | ICD-10-CM | POA: Diagnosis not present

## 2021-12-29 DIAGNOSIS — J449 Chronic obstructive pulmonary disease, unspecified: Secondary | ICD-10-CM | POA: Diagnosis not present

## 2021-12-29 DIAGNOSIS — E1151 Type 2 diabetes mellitus with diabetic peripheral angiopathy without gangrene: Secondary | ICD-10-CM | POA: Diagnosis not present

## 2021-12-29 DIAGNOSIS — L89893 Pressure ulcer of other site, stage 3: Secondary | ICD-10-CM | POA: Diagnosis not present

## 2021-12-29 DIAGNOSIS — R531 Weakness: Secondary | ICD-10-CM | POA: Diagnosis not present

## 2021-12-29 DIAGNOSIS — E11622 Type 2 diabetes mellitus with other skin ulcer: Secondary | ICD-10-CM | POA: Diagnosis not present

## 2021-12-29 DIAGNOSIS — Z7984 Long term (current) use of oral hypoglycemic drugs: Secondary | ICD-10-CM | POA: Diagnosis not present

## 2021-12-29 DIAGNOSIS — Z794 Long term (current) use of insulin: Secondary | ICD-10-CM | POA: Diagnosis not present

## 2021-12-30 ENCOUNTER — Encounter (HOSPITAL_BASED_OUTPATIENT_CLINIC_OR_DEPARTMENT_OTHER): Payer: Medicare Other | Attending: Internal Medicine | Admitting: Internal Medicine

## 2022-01-06 DIAGNOSIS — I739 Peripheral vascular disease, unspecified: Secondary | ICD-10-CM | POA: Diagnosis not present

## 2022-01-06 DIAGNOSIS — E1151 Type 2 diabetes mellitus with diabetic peripheral angiopathy without gangrene: Secondary | ICD-10-CM | POA: Diagnosis not present

## 2022-01-06 DIAGNOSIS — E11622 Type 2 diabetes mellitus with other skin ulcer: Secondary | ICD-10-CM | POA: Diagnosis not present

## 2022-01-06 DIAGNOSIS — R531 Weakness: Secondary | ICD-10-CM | POA: Diagnosis not present

## 2022-01-06 DIAGNOSIS — Z794 Long term (current) use of insulin: Secondary | ICD-10-CM | POA: Diagnosis not present

## 2022-01-06 DIAGNOSIS — J449 Chronic obstructive pulmonary disease, unspecified: Secondary | ICD-10-CM | POA: Diagnosis not present

## 2022-01-06 DIAGNOSIS — L89893 Pressure ulcer of other site, stage 3: Secondary | ICD-10-CM | POA: Diagnosis not present

## 2022-01-06 DIAGNOSIS — J9611 Chronic respiratory failure with hypoxia: Secondary | ICD-10-CM | POA: Diagnosis not present

## 2022-01-06 DIAGNOSIS — L97328 Non-pressure chronic ulcer of left ankle with other specified severity: Secondary | ICD-10-CM | POA: Diagnosis not present

## 2022-01-06 DIAGNOSIS — Z7984 Long term (current) use of oral hypoglycemic drugs: Secondary | ICD-10-CM | POA: Diagnosis not present

## 2022-01-06 NOTE — Progress Notes (Signed)
Crystal, Moore ([id number]) Visit Report for 12/02/2021 Abuse Risk Screen Details Patient Name: Date of Service: Crystal Moore, Crystal Moore. 12/02/2021 9:45 A M Medical Record Number: [id number] Patient Account Number: [id number] Date of Birth/Sex: Treating RN: 03/31/1946 (76 y.o. Crystal Moore Primary Care Tahni Porchia: [name] Other Clinician: Referring Waris Rodger: Treating Jeannett Dekoning/Extender: [name] Weeks in Treatment: 0 Abuse Risk Screen Items Answer ABUSE RISK SCREEN: Has anyone close to you tried to hurt or harm you recentlyo No Do you feel uncomfortable with anyone in your familyo No Has anyone forced you do things that you didnt want to doo No Electronic Signature(s) Signed: 12/02/2021 5:49:10 PM By: Deon Pilling RN, BSN Signed: 01/06/2022 8:46:54 AM By: Erenest Blank Entered By: Erenest Blank on 12/02/2021 10:13:53 -------------------------------------------------------------------------------- Activities of Daily Living Details Patient Name: Date of Service: Crystal Moore. 12/02/2021 9:45 A M Medical Record Number: [id number] Patient Account Number: [id number] Date of Birth/Sex: Treating RN: 10-13-1945 (76 y.o. Crystal Moore Primary Care Natascha Edmonds: [name] Other Clinician: Referring Varick Keys: Treating Veralyn Lopp/Extender: [name] Weeks in Treatment: 0 Activities of Daily Living Items Answer Activities of Daily Living (Please select one for each item) Drive Automobile Not Able T Medications ake Not Able Use T elephone Not Able Care for Appearance Not Able Use T oilet Not Able Manus Rudd / Shower Not Able Dress Self Not Able Feed Self Completely Able Walk Not Able Get In / Out Bed Not [place] for Self Not Able Electronic Signature(s) Signed: 12/02/2021 5:49:10 PM By: Deon Pilling RN, BSN Signed: 01/06/2022 8:46:54 AM By:  Erenest Blank Entered By: Erenest Blank on 12/02/2021 10:14:45 -------------------------------------------------------------------------------- Education Screening Details Patient Name: Date of Service: Crystal Moore. 12/02/2021 9:45 A M Medical Record Number: [id number] Patient Account Number: [id number] Date of Birth/Sex: Treating RN: 1946-03-03 (76 y.o. Crystal Moore Primary Care Kataya Guimont: [name] Other Clinician: Referring Abdul Beirne: Treating Emilee Market/Extender: [name] in Treatment: 0 Primary Learner Assessed: Caregiver daughter Reason Patient is not Primary Learner: dementia Learning Preferences/Education Level/Primary Language Learning Preference: Explanation, Demonstration, Printed Material Highest Education Level: High School Preferred Language: English Cognitive Barrier Language Barrier: No Translator Needed: No Memory Deficit: Yes patient Emotional Barrier: No Cultural/Religious Beliefs Affecting Medical Care: No Physical Barrier Impaired Vision: Yes Glasses Impaired Hearing: No Decreased Hand dexterity: No Knowledge/Comprehension Knowledge Level: High Comprehension Level: High Ability to understand written instructions: High Ability to understand verbal instructions: High Motivation Anxiety Level: Calm Cooperation: Cooperative Education Importance: Acknowledges Need Interest in Health Problems: Asks Questions Perception: Coherent Willingness to Engage in Self-Management High Activities: Readiness to Engage in Self-Management High Activities: Electronic Signature(s) Signed: 12/02/2021 5:49:10 PM By: Deon Pilling RN, BSN Signed: 01/06/2022 8:46:54 AM By: Erenest Blank Entered By: Erenest Blank on 12/02/2021 10:17:51 -------------------------------------------------------------------------------- Fall Risk Assessment Details Patient Name: Date of Service: Crystal Moore. 12/02/2021 9:45 A  M Medical Record Number: [id number] Patient Account Number: [id number] Date of Birth/Sex: Treating RN: [birth year] (76 y.o. [name], [name] Primary Care Sosaia Pittinger: [name] Other Clinician: Referring Dinnis Rog: Treating Vernessa Likes/Extender: [name] Weeks in Treatment: 0 Fall Risk Assessment Items Have you had 2 or more falls in the last 12 monthso 0 No Have you had any fall that resulted in injury in the last 12 monthso 0 No FALLS RISK SCREEN History of falling - immediate or within 3 months 0 No Secondary diagnosis (Do you have 2  or more medical diagnoseso) 0 No Ambulatory aid None/bed rest/wheelchair/nurse 0 No Crutches/cane/walker 15 Yes Furniture 0 No Intravenous therapy Access/Saline/Heparin Lock 0 No Gait/Transferring Normal/ bed rest/ wheelchair 0 Yes Weak (short steps with or without shuffle, stooped but able to lift head while walking, may seek 0 No support from furniture) Impaired (short steps with shuffle, may have difficulty arising from chair, head down, impaired 0 No balance) Mental Status Oriented to own ability 0 No Electronic Signature(s) Signed: 12/02/2021 5:49:10 PM By: Deon Pilling RN, BSN Signed: 01/06/2022 8:46:54 AM By: Erenest Blank Entered By: Erenest Blank on 12/02/2021 10:19:04 -------------------------------------------------------------------------------- Foot Assessment Details Patient Name: Date of Service: Crystal Moore. 12/02/2021 9:45 A M Medical Record Number: [id number] Patient Account Number: [id number] Date of Birth/Sex: Treating RN: 14-Dec-1945 (76 y.o. Crystal Moore Primary Care Bayne Fosnaugh: [name] Other Clinician: Referring Renzo Vincelette: Treating Berniece Abid/Extender: [name] Weeks in Treatment: 0 Foot Assessment Items Site Locations + = Sensation present, - = Sensation absent, C = Callus, U = Ulcer R = Redness, W = Warmth, M = Maceration, PU =  Pre-ulcerative lesion F = Fissure, S = Swelling, D = Dryness Assessment Right: Left: Other Deformity: No No Prior Foot Ulcer: No No Prior Amputation: No No Charcot Joint: No No Ambulatory Status: Non-ambulatory Assistance Device: Wheelchair GaitEnergy manager) Signed: 12/02/2021 5:49:10 PM By: Deon Pilling RN, BSN Entered By: Deon Pilling on 12/02/2021 10:26:22 -------------------------------------------------------------------------------- Nutrition Risk Screening Details Patient Name: Date of Service: Crystal Moore. 12/02/2021 9:45 A M Medical Record Number: [id number] Patient Account Number: [id number] Date of Birth/Sex: Treating RN: Sep 04, 1945 (76 y.o. [name], [name] Primary Care Jarrin Staley: [name] Other Clinician: Referring Abiel Antrim: Treating Denario Bagot/Extender: [name] Weeks in Treatment: 0 Height (in): 62 Weight (lbs): 128 Body Mass Index (BMI): 23.4 Nutrition Risk Screening Items Score Screening NUTRITION RISK SCREEN: I have an illness or condition that made me change the kind and/or amount of food I eat 2 Yes I eat fewer than two meals per day 0 No I eat few fruits and vegetables, or milk products 0 No I have three or more drinks of beer, liquor or wine almost every day 0 No I have tooth or mouth problems that make it hard for me to eat 0 No I don't always have enough money to buy the food I need 0 No I eat alone most of the time 0 No I take three or more different prescribed or over-the-counter drugs a day 1 Yes Without wanting to, I have lost or gained 10 pounds in the last six months 2 Yes I am not always physically able to shop, cook and/or feed myself 0 No Nutrition Protocols Good Risk Protocol Provide education on elevated blood Moderate Risk Protocol 0 sugars and impact on wound healing, as applicable High Risk Proctocol Risk Level: Moderate Risk Score: 5 Electronic  Signature(s) Signed: 12/02/2021 5:49:10 PM By: Deon Pilling RN, BSN Signed: 01/06/2022 8:46:54 AM By: Erenest Blank Entered By: Erenest Blank on 12/02/2021 10:21:46

## 2022-01-06 NOTE — Progress Notes (Addendum)
Crystal Moore, Crystal Moore ([id number]) Visit Report for 12/02/2021 Allergy List Details Patient Name: Date of Service: Crystal Moore, Crystal Moore. 12/02/2021 9:45 A M Medical Record Number: [id number] Patient Account Number: [id number] Date of Birth/Sex: Treating RN: [birth year] (76 y.o. Debby Bud Primary Care Corsica Franson: [name] Other Clinician: Referring Eleanora Guinyard: Treating Adria Costley/Extender: [place] Bells, Rupashree Weeks in Treatment: 0 Allergies Active Allergies aspirin Reaction: GI bleed Allergy Notes Electronic Signature(s) Signed: 01/06/2022 8:46:54 AM By: Erenest Blank Entered By: Erenest Blank on 12/02/2021 10:10:19 -------------------------------------------------------------------------------- Arrival Information Details Patient Name: Date of Service: Crystal Lennox. 12/02/2021 9:45 A M Medical Record Number: [id number] Patient Account Number: [id number] Date of Birth/Sex: Treating RN: August 17, 1945 (76 y.o. [name], [name] Primary Care Garcia Dalzell: [name] Other Clinician: Referring Rhea Thrun: Treating Nilza Eaker/Extender: [name] Weeks in Treatment: 0 Visit Information Patient Arrived: Wheel Chair Arrival Time: 10:00 Accompanied By: daughter Transfer Assistance: Manual Patient Identification Verified: Yes Secondary Verification Process Completed: Yes Patient Requires Transmission-Based Precautions: No Patient Has Alerts: No History Since Last Visit Added or deleted any medications: No Any new allergies or adverse reactions: No Had a fall or experienced change in activities of daily living that may affect risk of falls: No Signs or symptoms of abuse/neglect since last visito No Hospitalized since last visit: No Implantable device outside of the clinic excluding cellular tissue based products placed in the center since last visit: No Pain Present Now: No Electronic Signature(s) Signed: 01/06/2022 8:46:54 AM  By: Erenest Blank Entered By: Erenest Blank on 12/02/2021 10:09:03 -------------------------------------------------------------------------------- Clinic Level of Care Assessment Details Patient Name: Date of Service: Crystal Moore, Crystal Moore 12/02/2021 9:45 A M Medical Record Number: [id number] Patient Account Number: [id number] Date of Birth/Sex: Treating RN: [birth year] (76 y.o. Debby Bud Primary Care Dalon Reichart: [name] Other Clinician: Referring Viviana Trimble: Treating Adelaide Pfefferkorn/Extender: [name] Weeks in Treatment: 0 Clinic Level of Care Assessment Items TOOL 1 Quantity Score X- 1 0 Use when EandM and Procedure is performed on INITIAL visit ASSESSMENTS - Nursing Assessment / Reassessment X- 1 20 General Physical Exam (combine w/ comprehensive assessment (listed just below) when performed on new pt. evals) X- 1 25 Comprehensive Assessment (HX, ROS, Risk Assessments, Wounds Hx, etc.) ASSESSMENTS - Wound and Skin Assessment / Reassessment X- 1 10 Dermatologic / Skin Assessment (not related to wound area) ASSESSMENTS - Ostomy and/or Continence Assessment and Care []  - 0 Incontinence Assessment and Management []  - 0 Ostomy Care Assessment and Management (repouching, etc.) PROCESS - Coordination of Care []  - 0 Simple Patient / Family Education for ongoing care X- 1 20 Complex (extensive) Patient / Family Education for ongoing care X- 1 10 Staff obtains Programmer, systems, Records, T Results / Process Orders est X- 1 10 Staff telephones HHA, Nursing Homes / Clarify orders / etc []  - 0 Routine Transfer to another Facility (non-emergent condition) []  - 0 Routine Hospital Admission (non-emergent condition) X- 1 15 New Admissions / Biomedical engineer / Ordering NPWT Apligraf, etc. , []  - 0 Emergency Hospital Admission (emergent condition) PROCESS - Special Needs []  - 0 Pediatric / Minor Patient Management []  - 0 Isolation Patient  Management []  - 0 Hearing / Language / Visual special needs []  - 0 Assessment of Community assistance (transportation, D/C planning, etc.) []  - 0 Additional assistance / Altered mentation []  - 0 Support Surface(s) Assessment (bed, cushion, seat, etc.) INTERVENTIONS - Miscellaneous []  - 0 External ear exam []  - 0 Patient Transfer (multiple staff / Civil Service fast streamer / Similar  devices) []  - 0 Simple Staple / Suture removal (25 or less) []  - 0 Complex Staple / Suture removal (26 or more) []  - 0 Hypo/Hyperglycemic Management (do not check if billed separately) X- 1 15 Ankle / Brachial Index (ABI) - do not check if billed separately Has the patient been seen at the hospital within the last three years: Yes Total Score: 125 Level Of Care: New/Established - Level 4 Electronic Signature(s) Signed: 12/02/2021 5:49:10 PM By: Deon Pilling RN, BSN Signed: 12/02/2021 5:49:10 PM By: Deon Pilling RN, BSN Entered By: Deon Pilling on 12/02/2021 10:55:21 -------------------------------------------------------------------------------- Encounter Discharge Information Details Patient Name: Date of Service: Crystal Lennox. 12/02/2021 9:45 A M Medical Record Number: [id number] Patient Account Number: [id number] Date of Birth/Sex: Treating RN: 1946-03-25 (76 y.o. Debby Bud Primary Care Lechelle Wrigley: [name] Other Clinician: Referring Lexine Jaspers: Treating Haston Casebolt/Extender: [name] Weeks in Treatment: 0 Encounter Discharge Information Items Post Procedure Vitals Discharge Condition: Stable Temperature (F): 97.9 Ambulatory Status: Wheelchair Pulse (bpm): 83 Discharge Destination: Home Respiratory Rate (breaths/min): 16 Transportation: Private Auto Blood Pressure (mmHg): 192/73 Accompanied By: daughter Schedule Follow-up Appointment: Yes Clinical Summary of Care: Electronic Signature(s) Signed: 12/02/2021 5:49:10 PM By: Deon Pilling RN, BSN Entered  By: Deon Pilling on 12/02/2021 10:56:04 -------------------------------------------------------------------------------- Lower Extremity Assessment Details Patient Name: Date of Service: Crystal Lennox. 12/02/2021 9:45 A M Medical Record Number: [id number] Patient Account Number: [id number] Date of Birth/Sex: Treating RN: 1946-07-09 (76 y.o. [name], [name] Primary Care Matisse Roskelley: [name] Other Clinician: Referring Pauletta Pickney: Treating Laia Wiley/Extender: [name] Weeks in Treatment: 0 Edema Assessment Assessed: [Left: Yes] [Right: No] Edema: [Left: Ye] [Right: s] Calf Left: Right: Point of Measurement: 29 cm From Medial Instep 30.5 cm Ankle Left: Right: Point of Measurement: 9 cm From Medial Instep 20 cm Knee To Floor Left: Right: From Medial Instep 38 cm Vascular Assessment Pulses: Dorsalis Pedis Palpable: [Left:Yes] Doppler Audible: [Left:Yes] Posterior Tibial Palpable: [Left:Yes] Doppler Audible: [Left:Yes] Blood Pressure: Brachial: [Left:160] Ankle: [Left:Dorsalis Pedis: 102 0.64] Electronic Signature(s) Signed: 12/02/2021 5:49:10 PM By: Deon Pilling RN, BSN Entered By: Deon Pilling on 12/02/2021 10:33:45 -------------------------------------------------------------------------------- Multi Wound Chart Details Patient Name: Date of Service: Crystal Grammes M. 12/02/2021 9:45 A M Medical Record Number: [id number] Patient Account Number: [id number] Date of Birth/Sex: Treating RN: June 16, 1946 (76 y.o. [name], [name] Primary Care Azani Brogdon: [name] Other Clinician: Referring Maegan Buller: Treating Majour Frei/Extender: [name] Weeks in Treatment: 0 Vital Signs Height(in): 62 Pulse(bpm): 88 Weight(lbs): 128 Blood Pressure(mmHg): 192/73 Body Mass Index(BMI): 23.4 Temperature(F): 97.9 Respiratory Rate(breaths/min): 16 Photos: [N/A:N/A] Left, Lateral Foot Left, Lateral  Malleolus N/A Wound Location: Pressure Injury Pressure Injury N/A Wounding Event: Pressure Ulcer Pressure Ulcer N/A Primary Etiology: Diabetic Wound/Ulcer of the Lower Diabetic Wound/Ulcer of the Lower N/A Secondary Etiology: Extremity Extremity Cataracts, Chronic Obstructive Cataracts, Chronic Obstructive N/A Comorbid History: Pulmonary Disease (COPD), Pulmonary Disease (COPD), Hypertension, Type II Diabetes, Hypertension, Type II Diabetes, Osteoarthritis, Seizure Disorder Osteoarthritis, Seizure Disorder 08/27/2021 11/24/2020 N/A Date Acquired: 0 0 N/A Weeks of Treatment: Open Open N/A Wound Status: No No N/A Wound Recurrence: 1.2x1.2x0.3 0.8x0.3x0.2 N/A Measurements L x W x D (cm) 1.131 0.188 N/A A (cm) : rea 0.339 0.038 N/A Volume (cm) : 0.00% 0.00% N/A % Reduction in A rea: 0.00% 0.00% N/A % Reduction in Volume: Category/Stage III Category/Stage III N/A Classification: Medium Medium N/A Exudate A mount: Serosanguineous Serosanguineous N/A Exudate Type: red, brown red, brown N/A Exudate Color: Distinct, outline attached Distinct,  outline attached N/A Wound Margin: Large (67-100%) Large (67-100%) N/A Granulation A mount: Pink, Pale Red, Pink N/A Granulation Quality: Small (1-33%) Small (1-33%) N/A Necrotic A mount: Fat Layer (Subcutaneous Tissue): Yes Fat Layer (Subcutaneous Tissue): Yes N/A Exposed Structures: Fascia: No Fascia: No Tendon: No Tendon: No Muscle: No Muscle: No Joint: No Joint: No Bone: No Bone: No None Small (1-33%) N/A Epithelialization: Chemical/Enzymatic/Mechanical Chemical/Enzymatic/Mechanical N/A Debridement: N/A N/A N/A Instrument: None None N/A Bleeding: Procedure was tolerated well Procedure was tolerated well N/A Debridement Treatment Response: 1.2x1.2x0.3 0.8x0.3x0.2 N/A Post Debridement Measurements L x W x D (cm) 0.339 0.038 N/A Post Debridement Volume: (cm) Category/Stage III Category/Stage III N/A Post  Debridement Stage: Debridement Debridement N/A Procedures Performed: Treatment Notes Wound #7 (Foot) Wound Laterality: Left, Lateral Cleanser Soap and Water Discharge Instruction: May shower and wash wound with dial antibacterial soap and water prior to dressing change. Wound Cleanser Discharge Instruction: Cleanse the wound with wound cleanser prior to applying a clean dressing using gauze sponges, not tissue or cotton balls. Peri-Wound Care Skin Prep Discharge Instruction: Use skin prep as directed Topical Primary Dressing Hydrofera Blue Ready Foam, 2.5 x2.5 in Discharge Instruction: Apply over santyl. Santyl Ointment Discharge Instruction: Apply nickel thick amount to wound bed as instructed Secondary Dressing Bordered Gauze, 4x4 in Discharge Instruction: Apply over primary dressing as directed. Secured With Compression Wrap Compression Stockings Add-Ons Wound #8 (Malleolus) Wound Laterality: Left, Lateral Cleanser Soap and Water Discharge Instruction: May shower and wash wound with dial antibacterial soap and water prior to dressing change. Wound Cleanser Discharge Instruction: Cleanse the wound with wound cleanser prior to applying a clean dressing using gauze sponges, not tissue or cotton balls. Peri-Wound Care Skin Prep Discharge Instruction: Use skin prep as directed Topical Primary Dressing Hydrofera Blue Ready Foam, 2.5 x2.5 in Discharge Instruction: Apply over santyl. Santyl Ointment Discharge Instruction: Apply nickel thick amount to wound bed as instructed Secondary Dressing Bordered Gauze, 4x4 in Discharge Instruction: Apply over primary dressing as directed. Secured With Compression Wrap Compression Stockings Environmental education officer) Signed: 12/02/2021 1:45:50 PM By: Kalman Shan DO Signed: 12/02/2021 5:49:10 PM By: Deon Pilling RN, BSN Entered By: Kalman Shan on 12/02/2021  12:55:14 -------------------------------------------------------------------------------- Multi-Disciplinary Care Plan Details Patient Name: Date of Service: Crystal Lennox. 12/02/2021 9:45 A M Medical Record Number: [id number] Patient Account Number: [id number] Date of Birth/Sex: Treating RN: 14-Mar-1946 (76 y.o. Debby Bud Primary Care Reene Harlacher: [name] Other Clinician: Referring Derrell Milanes: Treating Trapper Meech/Extender: [name] Weeks in Treatment: 0 Active Inactive Electronic Signature(s) Signed: 01/13/2022 5:03:44 PM By: Deon Pilling RN, BSN Previous Signature: 12/02/2021 5:49:10 PM Version By: Deon Pilling RN, BSN Entered By: Deon Pilling on 01/13/2022 17:03:43 -------------------------------------------------------------------------------- Pain Assessment Details Patient Name: Date of Service: Crystal Lennox. 12/02/2021 9:45 A M Medical Record Number: [id number] Patient Account Number: [id number] Date of Birth/Sex: Treating RN: [birth year] (76 y.o. Debby Bud Primary Care Sammy Cassar: [name] Other Clinician: Referring Darika Ildefonso: Treating Annalaya Wile/Extender: [name] Weeks in Treatment: 0 Active Problems Location of Pain Severity and Description of Pain Patient Has Paino No Site Locations Rate the pain. Current Pain Level: 0 Pain Management and Medication Current Pain Management: Medication: No Cold Application: No Rest: No Massage: No Activity: No T.E.N.S.: No Heat Application: No Leg drop or elevation: No Is the Current Pain Management Adequate: Adequate How does your wound impact your activities of daily livingo Sleep: No Bathing: No Appetite: No Relationship With Others: No Bladder Continence: No Emotions:  No Bowel Continence: No Work: No Toileting: No Drive: No Dressing: No Hobbies: No Notes per daughter patient c/o pain at times. Electronic  Signature(s) Signed: 12/02/2021 5:49:10 PM By: Deon Pilling RN, BSN Entered By: Deon Pilling on 12/02/2021 10:36:59 -------------------------------------------------------------------------------- Patient/Caregiver Education Details Patient Name: Date of Service: Crystal Lennox 5/9/2023andnbsp9:45 [place] Record Number: [id number] Patient Account Number: [id number] Date of Birth/Gender: Treating RN: [birth year] (76 y.o. Debby Bud Primary Care Physician: [name] Other Clinician: Referring Physician: Treating Physician/Extender: [name] in Treatment: 0 Education Assessment Education Provided To: Patient Education Topics Provided Wound Debridement: Handouts: Wound Debridement Methods: Explain/Verbal, Printed Responses: Reinforcements needed Electronic Signature(s) Signed: 12/02/2021 5:49:10 PM By: Deon Pilling RN, BSN Entered By: Deon Pilling on 12/02/2021 10:38:58 -------------------------------------------------------------------------------- Wound Assessment Details Patient Name: Date of Service: Crystal Lennox. 12/02/2021 9:45 A M Medical Record Number: [id number] Patient Account Number: [id number] Date of Birth/Sex: Treating RN: November 10, 1945 (76 y.o. [name], [name] Primary Care Sherilyn Windhorst: [name] Other Clinician: Referring Khriz Liddy: Treating Julizza Sassone/Extender: [name] Weeks in Treatment: 0 Wound Status Wound Number: 7 Primary Pressure Ulcer Etiology: Wound Location: Left, Lateral Foot Secondary Diabetic Wound/Ulcer of the Lower Extremity Wounding Event: Pressure Injury Etiology: Date Acquired: 08/27/2021 Wound Open Weeks Of Treatment: 0 Status: Clustered Wound: No Comorbid Cataracts, Chronic Obstructive Pulmonary Disease (COPD), History: Hypertension, Type II Diabetes, Osteoarthritis, Seizure Disorder Photos Wound Measurements Length: (cm) 1.2 Width: (cm)  1.2 Depth: (cm) 0.3 Area: (cm) 1.131 Volume: (cm) 0.339 % Reduction in Area: 0% % Reduction in Volume: 0% Epithelialization: None Tunneling: No Undermining: No Wound Description Classification: Category/Stage III Wound Margin: Distinct, outline attached Exudate Amount: Medium Exudate Type: Serosanguineous Exudate Color: red, brown Foul Odor After Cleansing: No Slough/Fibrino Yes Wound Bed Granulation Amount: Large (67-100%) Exposed Structure Granulation Quality: Pink, Pale Fascia Exposed: No Necrotic Amount: Small (1-33%) Fat Layer (Subcutaneous Tissue) Exposed: Yes Necrotic Quality: Adherent Slough Tendon Exposed: No Muscle Exposed: No Joint Exposed: No Bone Exposed: No Electronic Signature(s) Signed: 12/02/2021 5:49:10 PM By: Deon Pilling RN, BSN Signed: 01/06/2022 8:46:54 AM By: Erenest Blank Entered By: Erenest Blank on 12/02/2021 10:36:40 -------------------------------------------------------------------------------- Wound Assessment Details Patient Name: Date of Service: Crystal Lennox. 12/02/2021 9:45 A M Medical Record Number: [id number] Patient Account Number: [id number] Date of Birth/Sex: Treating RN: Apr 03, 1946 (76 y.o. [name], [name] Primary Care Aubrianna Orchard: [name] Other Clinician: Referring Caelie R[place]: Treating Priscilla Kirstein/Extender: [name] Weeks in Treatment: 0 Wound Status Wound Number: 8 Primary Pressure Ulcer Etiology: Wound Location: Left, Lateral Malleolus Secondary Diabetic Wound/Ulcer of the Lower Extremity Wounding Event: Pressure Injury Wounding Event: Pressure Injury Etiology: Date Acquired: 11/24/2020 Wound Open Weeks Of Treatment: 0 Status: Clustered Wound: No Comorbid Cataracts, Chronic Obstructive Pulmonary Disease (COPD), History: Hypertension, Type II Diabetes, Osteoarthritis, Seizure Disorder Photos Wound Measurements Length: (cm) 0.8 Width: (cm) 0.3 Depth: (cm) 0.2 Area: (cm)  0.188 Volume: (cm) 0.038 % Reduction in Area: 0% % Reduction in Volume: 0% Epithelialization: Small (1-33%) Tunneling: No Undermining: No Wound Description Classification: Category/Stage III Wound Margin: Distinct, outline attached Exudate Amount: Medium Exudate Type: Serosanguineous Exudate Color: red, brown Foul Odor After Cleansing: No Slough/Fibrino Yes Wound Bed Granulation Amount: Large (67-100%) Exposed Structure Granulation Quality: Red, Pink Fascia Exposed: No Necrotic Amount: Small (1-33%) Fat Layer (Subcutaneous Tissue) Exposed: Yes Necrotic Quality: Adherent Slough Tendon Exposed: No Muscle Exposed: No Joint Exposed: No Bone Exposed: No Electronic Signature(s) Signed: 12/02/2021 5:49:10 PM By: Deon Pilling RN, BSN Signed: 01/06/2022  8:46:54 AM By: Erenest Blank Entered By: Erenest Blank on 12/02/2021 10:37:10 -------------------------------------------------------------------------------- Vitals Details Patient Name: Date of Service: Crystal Grammes M. 12/02/2021 9:45 A M Medical Record Number: [id number] Patient Account Number: [id number] Date of Birth/Sex: Treating RN: 1945/12/14 (76 y.o. [name], [name] Primary Care Leaann Nevils: [name] Other Clinician: Referring Tylin Stradley: Treating Llewelyn Sheaffer/Extender: [name] Weeks in Treatment: 0 Vital Signs Time Taken: 10:09 Temperature (F): 97.9 Height (in): 62 Pulse (bpm): 83 Source: Stated Respiratory Rate (breaths/min): 16 Weight (lbs): 128 Blood Pressure (mmHg): 192/73 Source: Stated Reference Range: 80 - 120 mg / dl Body Mass Index (BMI): 23.4 Electronic Signature(s) Signed: 01/06/2022 8:46:54 AM By: Erenest Blank Entered By: Erenest Blank on 12/02/2021 10:09:56

## 2022-01-06 NOTE — Progress Notes (Signed)
DISAYA, WALT ([id number]) Visit Report for 12/02/2021 Chief Complaint Document Details Patient Name: Date of Service: Crystal Moore, Crystal Moore. 12/02/2021 9:45 A M Medical Record Number: [id number] Patient Account Number: [id number] Date of Birth/Sex: Treating RN: 07-18-1946 (76 y.o. Debby Bud Primary Care Provider: [name] Other Clinician: Referring Provider: Treating Provider/Extender: [name] Weeks in Treatment: 0 Information Obtained from: Patient Chief Complaint 08/08/2020; patient is here accompanied by her grandson for review of wounds on the left lateral malleolus as well as an area on the right upper mid tibia 02/25/2021; patient is accompanied by daughter for review of her left lateral malleolus wound 06/26/2021; patient is accompanied by daughter for review of her left lateral malleolus wound and left lateral foot wound Electronic Signature(s) Signed: 12/02/2021 1:45:50 PM By: Kalman Shan DO Entered By: Kalman Shan on 12/02/2021 12:55:24 -------------------------------------------------------------------------------- Debridement Details Patient Name: Date of Service: Crystal Grammes M. 12/02/2021 9:45 A M Medical Record Number: [id number] Patient Account Number: [id number] Date of Birth/Sex: Treating RN: [birth year] (76 y.o. Debby Bud Primary Care Provider: [name] Other Clinician: Referring Provider: Treating Provider/Extender: [name] Weeks in Treatment: 0 Debridement Performed for Assessment: Wound #7 Left,Lateral Foot Performed By: Clinician Deon Pilling, RN Debridement Type: Chemical/Enzymatic/Mechanical Agent Used: Santyl Severity of Tissue Pre Debridement: Fat layer exposed Level of Consciousness (Pre-procedure): Awake and Alert Pre-procedure Verification/Time Out No Taken: Bleeding: None Response to Treatment: Procedure was tolerated well Level of  Consciousness (Post- Awake and Alert procedure): Post Debridement Measurements of Total Wound Length: (cm) 1.2 Stage: Category/Stage III Width: (cm) 1.2 Depth: (cm) 0.3 Volume: (cm) 0.339 Character of Wound/Ulcer Post Debridement: Requires Further Debridement Severity of Tissue Post Debridement: Fat layer exposed Post Procedure Diagnosis Same as Pre-procedure Electronic Signature(s) Signed: 12/02/2021 1:45:50 PM By: Kalman Shan DO Signed: 12/02/2021 5:49:10 PM By: Deon Pilling RN, BSN Entered By: Deon Pilling on 12/02/2021 10:49:03 -------------------------------------------------------------------------------- Debridement Details Patient Name: Date of Service: Crystal Lennox. 12/02/2021 9:45 A M Medical Record Number: [id number] Patient Account Number: [id number] Date of Birth/Sex: Treating RN: Aug 30, 1945 (76 y.o. Debby Bud Primary Care Provider: [name] Other Clinician: Referring Provider: Treating Provider/Extender: [name] Weeks in Treatment: 0 Debridement Performed for Assessment: Wound #8 Left,Lateral Malleolus Performed By: Clinician Deon Pilling, RN Debridement Type: Chemical/Enzymatic/Mechanical Agent Used: Santyl Severity of Tissue Pre Debridement: Fat layer exposed Level of Consciousness (Pre-procedure): Awake and Alert Pre-procedure Verification/Time Out No Taken: Bleeding: None Response to Treatment: Procedure was tolerated well Level of Consciousness (Post- Awake and Alert procedure): Post Debridement Measurements of Total Wound Length: (cm) 0.8 Stage: Category/Stage III Width: (cm) 0.3 Depth: (cm) 0.2 Volume: (cm) 0.038 Character of Wound/Ulcer Post Debridement: Requires Further Debridement Severity of Tissue Post Debridement: Fat layer exposed Post Procedure Diagnosis Same as Pre-procedure Electronic Signature(s) Signed: 12/02/2021 1:45:50 PM By: Kalman Shan DO Signed: 12/02/2021 5:49:10 PM  By: Deon Pilling RN, BSN Entered By: Deon Pilling on 12/02/2021 10:49:19 -------------------------------------------------------------------------------- HPI Details Patient Name: Date of Service: Crystal Grammes M. 12/02/2021 9:45 A M Medical Record Number: [id number] Patient Account Number: [id number] Date of Birth/Sex: Treating RN: 05/26/1946 (76 y.o. Debby Bud Primary Care Provider: [name] Other Clinician: Referring Provider: Treating Provider/Extender: [name] Weeks in Treatment: 0 History of Present Illness HPI Description: Admission 02/25/2021 Crystal Moore is a 76 year old female with a past medical history of uncontrolled insulin-dependent type 2 diabetes, CVA with left hemiparesis, vascular dementia and peripheral vascular disease that  presents to the clinic for worsening left lateral malleolus wound. She was treated in our clinic earlier this year for the wound. It was well-healing at that time however she has not followed up since March. She has noticed a decline in the past 1.5 months. She has been using silver alginate to the area. She currently denies signs of infection. She does report tenderness to the wound bed. 8/22; patient presents for follow-up. Daughter is not present today. Patient has no issues or complaints today. She states that her daughter is doing the dressing changes. She has not obtained the x-ray ordered at last clinic visit. She is currently not in pain. Readmission 06/26/2021 Patient was last seen on 03/17/2021 for a left lateral malleolus wound. It is unclear why she has not followed up. She continues to have a left lateral malleolus wound and now has developed a left lateral foot wound. She has been using silver alginate to these areas. She currently denies signs of infection. She still has not obtained the x-ray ordered at last admission. 12/9/; patient presents for follow-up. She has not picked up  Santyl from the pharmacy. She continues to use silver alginate to the 2 wound beds. She reports improvement in healing. Readmission 12/02/2021 Patient was last seen on 07/04/2021. She was being followed for a left lateral malleolus wound and left lateral foot wound. These wounds have not closed since last time she was seen. She has been using silver alginate to the wound bed. She has a reoccurring theme of not following up her appointments. She currently denies signs of infection. Daughter is with her today as patient has severe dementia. She is not wearing Prevalon boots. Electronic Signature(s) Signed: 12/02/2021 1:45:50 PM By: Kalman Shan DO Entered By: Kalman Shan on 12/02/2021 12:58:13 -------------------------------------------------------------------------------- Physical Exam Details Patient Name: Date of Service: Crystal Lennox. 12/02/2021 9:45 A M Medical Record Number: [id number] Patient Account Number: [id number] Date of Birth/Sex: Treating RN: Mar 03, 1946 (76 y.o. Debby Bud Primary Care Provider: [name] Other Clinician: Referring Provider: Treating Provider/Extender: [name] Weeks in Treatment: 0 Constitutional respirations regular, non-labored and within target range for patient.. Cardiovascular 2+ dorsalis pedis/posterior tibialis pulses. Psychiatric pleasant and cooperative. Notes Left foot: T the lateral malleolus there is an open wound with granulation tissue and nonviable tissue present. T the lateral aspect there is an open wound with o o Granulation tissue and nonviable tissue. No obvious signs of infection on exam. Electronic Signature(s) Signed: 12/02/2021 1:45:50 PM By: Kalman Shan DO Entered By: Kalman Shan on 12/02/2021 13:35:07 -------------------------------------------------------------------------------- Physician Orders Details Patient Name: Date of Service: Crystal Lennox. 12/02/2021  9:45 A M Medical Record Number: [id number] Patient Account Number: [id number] Date of Birth/Sex: Treating RN: 09-02-1945 (77 y.o. Debby Bud Primary Care Provider: [name] Other Clinician: Referring Provider: Treating Provider/Extender: [name] Weeks in Treatment: 0 Verbal / Phone Orders: No Diagnosis Coding Follow-up Appointments ppointment in 2 weeks. - Dr. Heber [place] and [place], Room 8 12/16/2021 0900 Tuesday Return A Other: - Pick up santyl from your pharmacy. [employer] company will send you your supplies. Need to change dressing daily. Bathing/ Shower/ Hygiene May shower and wash wound with soap and water. - with dressing changes. Off-Loading Other: - ensure to float ankle and foot with a pillow or use a bunny boot while resting in chair or bed to aid in offloading pressure to wounds. Home Health Wound #7 Left,Lateral Foot New wound care orders this week;  continue Home Health for wound care. May utilize formulary equivalent dressing for wound treatment orders unless otherwise specified. - change twice a week all other days daughter to change. Other Home Health Orders/Instructions: - [facility]. Wound #8 Left,Lateral Malleolus New wound care orders this week; continue Home Health for wound care. May utilize formulary equivalent dressing for wound treatment orders unless otherwise specified. - change twice a week all other days daughter to change. Other Home Health Orders/Instructions: - [facility]. Wound Treatment Wound #7 - Foot Wound Laterality: Left, Lateral Cleanser: Soap and Water 1 x Per Day/30 Days Discharge Instructions: May shower and wash wound with dial antibacterial soap and water prior to dressing change. Cleanser: Wound Cleanser (DME) (Generic) 1 x Per Day/30 Days Discharge Instructions: Cleanse the wound with wound cleanser prior to applying a clean dressing using gauze sponges, not tissue or cotton  balls. Peri-Wound Care: Skin Prep (DME) (Generic) 1 x Per Day/30 Days Discharge Instructions: Use skin prep as directed Prim Dressing: Hydrofera Blue Ready Foam, 2.5 x2.5 in (DME) (Generic) 1 x Per Day/30 Days ary Discharge Instructions: Apply over santyl. Prim Dressing: Santyl Ointment 1 x Per Day/30 Days ary Discharge Instructions: Apply nickel thick amount to wound bed as instructed Secondary Dressing: Bordered Gauze, 4x4 in (DME) (Generic) 1 x Per Day/30 Days Discharge Instructions: Apply over primary dressing as directed. Wound #8 - Malleolus Wound Laterality: Left, Lateral Cleanser: Soap and Water 1 x Per Day/30 Days Discharge Instructions: May shower and wash wound with dial antibacterial soap and water prior to dressing change. Cleanser: Wound Cleanser (DME) (Generic) 1 x Per Day/30 Days Discharge Instructions: Cleanse the wound with wound cleanser prior to applying a clean dressing using gauze sponges, not tissue or cotton balls. Peri-Wound Care: Skin Prep (DME) (Generic) 1 x Per Day/30 Days Discharge Instructions: Use skin prep as directed Prim Dressing: Hydrofera Blue Ready Foam, 2.5 x2.5 in (DME) (Generic) 1 x Per Day/30 Days ary Discharge Instructions: Apply over santyl. Prim Dressing: Santyl Ointment 1 x Per Day/30 Days ary Discharge Instructions: Apply nickel thick amount to wound bed as instructed Secondary Dressing: Bordered Gauze, 4x4 in (DME) (Generic) 1 x Per Day/30 Days Discharge Instructions: Apply over primary dressing as directed. Patient Medications llergies: aspirin A Notifications Medication Indication Start End lidocaine DOSE topical 5 % ointment - ointment topical apply only in clinic. 12/02/2021 Santyl DOSE 1 - topical 250 unit/gram ointment - 1 application daily Electronic Signature(s) Signed: 12/02/2021 2:26:11 PM By: Kalman Shan DO Previous Signature: 12/02/2021 1:45:50 PM Version By: Kalman Shan DO Entered By: Kalman Shan on 12/02/2021  14:26:11 Prescription 12/02/2021 -------------------------------------------------------------------------------- Santa Lighter. Kalman Shan DO Patient Name: Provider: 1945-12-23 [id number] Date of Birth: NPI#: [id number] Sex: DEA #: [phone] Phone #: License #: [place] Patient Address: [address], [place] [address] [place],  [id number] [phone] Allergies aspirin Medication Medication: Route: Strength: Form: lidocaine 5 % topical ointment topical 5% ointment Class: TOPICAL LOCAL ANESTHETICS Dose: Frequency / Time: Indication: ointment topical apply only in clinic. Number of Refills: Number of Units: 0 Generic Substitution: Start Date: End Date: One Time Use: Substitution Permitted No Note to Pharmacy: Hand Signature: Date(s): Electronic Signature(s) Signed: 12/03/2021 12:34:06 PM By: Kalman Shan DO Previous Signature: 12/02/2021 1:45:50 PM Version By: Kalman Shan DO Entered By: Kalman Shan on 12/02/2021 14:26:12 -------------------------------------------------------------------------------- Problem List Details Patient Name: Date of Service: Crystal Grammes M. 12/02/2021 9:45  A M Medical Record Number: [id number] Patient Account Number: [id number] Date of Birth/Sex: Treating RN: [birth year] (76 y.o. Debby Bud Primary Care Provider: [name] Other Clinician: Referring Provider: Treating Provider/Extender: [name] Weeks in Treatment: 0 Active Problems ICD-10 Encounter Code Description Active Date MDM Diagnosis L89.523 Pressure ulcer of left ankle, stage 3 12/02/2021 No Yes L89.893 Pressure ulcer of other site, stage 3 12/02/2021 No Yes I73.9 Peripheral vascular disease, unspecified 12/02/2021 No Yes E11.621 Type 2 diabetes mellitus with foot ulcer 12/02/2021 No Yes Inactive Problems Resolved  Problems Electronic Signature(s) Signed: 12/02/2021 1:45:50 PM By: Kalman Shan DO Entered By: Kalman Shan on 12/02/2021 12:55:08 -------------------------------------------------------------------------------- Progress Note Details Patient Name: Date of Service: Crystal Lennox. 12/02/2021 9:45 A M Medical Record Number: [id number] Patient Account Number: [id number] Date of Birth/Sex: Treating RN: 07/15/1946 (76 y.o. [name], [name] Primary Care Provider: [name] Other Clinician: Referring Provider: Treating Provider/Extender: [name] Weeks in Treatment: 0 Subjective Chief Complaint Information obtained from Patient 08/08/2020; patient is here accompanied by her grandson for review of wounds on the left lateral malleolus as well as an area on the right upper mid tibia 02/25/2021; patient is accompanied by daughter for review of her left lateral malleolus wound 06/26/2021; patient is accompanied by daughter for review of her left lateral malleolus wound and left lateral foot wound History of Present Illness (HPI) Admission 02/25/2021 Crystal Moore is a 76 year old female with a past medical history of uncontrolled insulin-dependent type 2 diabetes, CVA with left hemiparesis, vascular dementia and peripheral vascular disease that presents to the clinic for worsening left lateral malleolus wound. She was treated in our clinic earlier this year for the wound. It was well-healing at that time however she has not followed up since March. She has noticed a decline in the past 1.5 months. She has been using silver alginate to the area. She currently denies signs of infection. She does report tenderness to the wound bed. 8/22; patient presents for follow-up. Daughter is not present today. Patient has no issues or complaints today. She states that her daughter is doing the dressing changes. She has not obtained the x-ray ordered at last clinic  visit. She is currently not in pain. Readmission 06/26/2021 Patient was last seen on 03/17/2021 for a left lateral malleolus wound. It is unclear why she has not followed up. She continues to have a left lateral malleolus wound and now has developed a left lateral foot wound. She has been using silver alginate to these areas. She currently denies signs of infection. She still has not obtained the x-ray ordered at last admission. 12/9/; patient presents for follow-up. She has not picked up Santyl from the pharmacy. She continues to use silver alginate to the 2 wound beds. She reports improvement in healing. Readmission 12/02/2021 Patient was last seen on 07/04/2021. She was being followed for a left lateral malleolus wound and left lateral foot wound. These wounds have not closed since last time she was seen. She has been using silver alginate to the wound bed. She has a reoccurring theme of not following up her appointments. She currently denies signs of infection. Daughter is with her today as patient has severe dementia. She is not wearing Prevalon boots. Patient History Unable to Obtain Patient History due to Aphasia. Information obtained from Patient. Allergies aspirin (Reaction: GI bleed) Family History Unknown History. Social History Former smoker - quit 4 years, Marital Status - Single, Alcohol Use - Never, Drug  Use - No History, Caffeine Use - Rarely. Medical History Eyes Patient has history of Cataracts - bil removed Respiratory Patient has history of Chronic Obstructive Pulmonary Disease (COPD) Cardiovascular Patient has history of Hypertension Endocrine Patient has history of Type II Diabetes Musculoskeletal Patient has history of Osteoarthritis Neurologic Patient has history of Seizure Disorder Psychiatric Denies history of Anorexia/bulimia, Confinement Anxiety Hospitalization/Surgery History - inpatient 06/30/2021- stroke and sepsis. Medical A Surgical History  Notes nd Cardiovascular CVA Genitourinary Pyelonephritis Neurologic left hemiparesis, CVA Objective Constitutional respirations regular, non-labored and within target range for patient.. Vitals Time Taken: 10:09 AM, Height: 62 in, Source: Stated, Weight: 128 lbs, Source: Stated, BMI: 23.4, Temperature: 97.9 F, Pulse: 83 bpm, Respiratory Rate: 16 breaths/min, Blood Pressure: 192/73 mmHg. Cardiovascular 2+ dorsalis pedis/posterior tibialis pulses. Psychiatric pleasant and cooperative. General Notes: Left foot: T the lateral malleolus there is an open wound with granulation tissue and nonviable tissue present. T the lateral aspect there is an o o open wound with Granulation tissue and nonviable tissue. No obvious signs of infection on exam. Integumentary (Hair, Skin) Wound #7 status is Open. Original cause of wound was Pressure Injury. The date acquired was: 08/27/2021. The wound is located on the Left,Lateral Foot. The wound measures 1.2cm length x 1.2cm width x 0.3cm depth; 1.131cm^2 area and 0.339cm^3 volume. There is Fat Layer (Subcutaneous Tissue) exposed. There is no tunneling or undermining noted. There is a medium amount of serosanguineous drainage noted. The wound margin is distinct with the outline attached to the wound base. There is large (67-100%) pink, pale granulation within the wound bed. There is a small (1-33%) amount of necrotic tissue within the wound bed including Adherent Slough. Wound #8 status is Open. Original cause of wound was Pressure Injury. The date acquired was: 11/24/2020. The wound is located on the Left,Lateral Malleolus. The wound measures 0.8cm length x 0.3cm width x 0.2cm depth; 0.188cm^2 area and 0.038cm^3 volume. There is Fat Layer (Subcutaneous Tissue) exposed. There is no tunneling or undermining noted. There is a medium amount of serosanguineous drainage noted. The wound margin is distinct with the outline attached to the wound base. There is large  (67-100%) red, pink granulation within the wound bed. There is a small (1-33%) amount of necrotic tissue within the wound bed including Adherent Slough. Assessment Active Problems ICD-10 Pressure ulcer of left ankle, stage 3 Pressure ulcer of other site, stage 3 Peripheral vascular disease, unspecified Type 2 diabetes mellitus with foot ulcer Patient has 2 wounds present. These are stable to compared to last clinic visit 4 months prior. The wounds are located to the left lateral malleolus and the other to the lateral aspect of the foot. I recommended using Santyl and Hydrofera Blue here. No surrounding signs of infection. Follow-up in 2 weeks. Procedures Wound #7 Pre-procedure diagnosis of Wound #7 is a Pressure Ulcer located on the Left,Lateral Foot .Severity of Tissue Pre Debridement is: Fat layer exposed. There was a Chemical/Enzymatic/Mechanical debridement performed by Deon Pilling, RN.Marland Kitchen Agent used was [employer]. There was no bleeding. The procedure was tolerated well. Post Debridement Measurements: 1.2cm length x 1.2cm width x 0.3cm depth; 0.339cm^3 volume. Post debridement Stage noted as Category/Stage III. Character of Wound/Ulcer Post Debridement requires further debridement. Severity of Tissue Post Debridement is: Fat layer exposed. Post procedure Diagnosis Wound #7: Same as Pre-Procedure Wound #8 Pre-procedure diagnosis of Wound #8 is a Pressure Ulcer located on the Left,Lateral Malleolus .Severity of Tissue Pre Debridement is: Fat layer exposed. There was a Chemical/Enzymatic/Mechanical debridement  performed by Deon Pilling, RN.Marland Kitchen Agent used was [employer]. There was no bleeding. The procedure was tolerated well. Post Debridement Measurements: 0.8cm length x 0.3cm width x 0.2cm depth; 0.038cm^3 volume. Post debridement Stage noted as Category/Stage III. Character of Wound/Ulcer Post Debridement requires further debridement. Severity of Tissue Post Debridement is: Fat layer  exposed. Post procedure Diagnosis Wound #8: Same as Pre-Procedure Plan Follow-up Appointments: Return Appointment in 2 weeks. - Dr. Heber [place] and [place], Room 8 12/16/2021 0900 Tuesday Other: - Pick up santyl from your pharmacy. [employer] company will send you your supplies. Need to change dressing daily. Bathing/ Shower/ Hygiene: May shower and wash wound with soap and water. - with dressing changes. Off-Loading: Other: - ensure to float ankle and foot with a pillow or use a bunny boot while resting in chair or bed to aid in offloading pressure to wounds. Home Health: Wound #7 Left,Lateral Foot: New wound care orders this week; continue Home Health for wound care. May utilize formulary equivalent dressing for wound treatment orders unless otherwise specified. - change twice a week all other days daughter to change. Other Home Health Orders/Instructions: - [facility]. Wound #8 Left,Lateral Malleolus: New wound care orders this week; continue Home Health for wound care. May utilize formulary equivalent dressing for wound treatment orders unless otherwise specified. - change twice a week all other days daughter to change. Other Home Health Orders/Instructions: - [facility]. The following medication(s) was prescribed: lidocaine topical 5 % ointment ointment topical apply only in clinic. was prescribed at facility Santyl topical 250 unit/gram ointment 1 1 application daily starting 12/02/2021 WOUND #7: - Foot Wound Laterality: Left, Lateral Cleanser: Soap and Water 1 x Per Day/30 Days Discharge Instructions: May shower and wash wound with dial antibacterial soap and water prior to dressing change. Cleanser: Wound Cleanser (DME) (Generic) 1 x Per Day/30 Days Discharge Instructions: Cleanse the wound with wound cleanser prior to applying a clean dressing using gauze sponges, not tissue or cotton balls. Peri-Wound Care: Skin Prep (DME) (Generic) 1 x Per Day/30 Days Discharge  Instructions: Use skin prep as directed Prim Dressing: Hydrofera Blue Ready Foam, 2.5 x2.5 in (DME) (Generic) 1 x Per Day/30 Days ary Discharge Instructions: Apply over santyl. Prim Dressing: Santyl Ointment 1 x Per Day/30 Days ary Discharge Instructions: Apply nickel thick amount to wound bed as instructed Secondary Dressing: Bordered Gauze, 4x4 in (DME) (Generic) 1 x Per Day/30 Days Discharge Instructions: Apply over primary dressing as directed. WOUND #8: - Malleolus Wound Laterality: Left, Lateral Cleanser: Soap and Water 1 x Per Day/30 Days Discharge Instructions: May shower and wash wound with dial antibacterial soap and water prior to dressing change. Cleanser: Wound Cleanser (DME) (Generic) 1 x Per Day/30 Days Discharge Instructions: Cleanse the wound with wound cleanser prior to applying a clean dressing using gauze sponges, not tissue or cotton balls. Peri-Wound Care: Skin Prep (DME) (Generic) 1 x Per Day/30 Days Discharge Instructions: Use skin prep as directed Prim Dressing: Hydrofera Blue Ready Foam, 2.5 x2.5 in (DME) (Generic) 1 x Per Day/30 Days ary Discharge Instructions: Apply over santyl. Prim Dressing: Santyl Ointment 1 x Per Day/30 Days ary Discharge Instructions: Apply nickel thick amount to wound bed as instructed Secondary Dressing: Bordered Gauze, 4x4 in (DME) (Generic) 1 x Per Day/30 Days Discharge Instructions: Apply over primary dressing as directed. 1. Hydrofera Blue with Santyl daily 2. Follow-up in 2 weeks 3. Aggressive offloadingooPrevalon boots Electronic Signature(s) Signed: 12/08/2021 4:42:46 PM By: Kalman Shan DO Entered By:  Kalman Shan on 12/08/2021 16:39:42 -------------------------------------------------------------------------------- HxROS Details Patient Name: Date of Service: Crystal Moore, Crystal Moore. 12/02/2021 9:45 A M Medical Record Number: [id number] Patient Account Number: [id number] Date of Birth/Sex: Treating RN: 15-Apr-1946 (76 y.o.  Debby Bud Primary Care Provider: [name] Other Clinician: Referring Provider: Treating Provider/Extender: [name] Weeks in Treatment: 0 Unable to Obtain Patient History due to Aphasia Information Obtained From Patient Eyes Medical History: Positive for: Cataracts - bil removed Respiratory Medical History: Positive for: Chronic Obstructive Pulmonary Disease (COPD) Cardiovascular Medical History: Positive for: Hypertension Past Medical History Notes: CVA Endocrine Medical History: Positive for: Type II Diabetes Time with diabetes: >30 yrs Treated with: Insulin, Oral agents Blood sugar tested every day: No Genitourinary Medical History: Past Medical History Notes: Pyelonephritis Musculoskeletal Medical History: Positive for: Osteoarthritis Neurologic Medical History: Positive for: Seizure Disorder Past Medical History Notes: left hemiparesis, CVA Psychiatric Medical History: Negative for: Anorexia/bulimia; Confinement Anxiety HBO Extended History Items Eyes: Cataracts Immunizations Pneumococcal Vaccine: Received Pneumococcal Vaccination: Yes Received Pneumococcal Vaccination On or After 60th Birthday: Yes Implantable Devices None Hospitalization / Surgery History Type of Hospitalization/Surgery inpatient 06/30/2021- stroke and sepsis Family and Social History Unknown History: Yes; Former smoker - quit 4 years; Marital Status - Single; Alcohol Use: Never; Drug Use: No History; Caffeine Use: Rarely; Financial Concerns: No; Food, Clothing or Shelter Needs: No; Support System Lacking: No; Transportation Concerns: No Electronic Signature(s) Signed: 12/02/2021 1:45:50 PM By: Kalman Shan DO Signed: 12/02/2021 5:49:10 PM By: Deon Pilling RN, BSN Signed: 01/06/2022 8:46:54 AM By: Erenest Blank Entered By: Erenest Blank on 12/02/2021  10:13:25 -------------------------------------------------------------------------------- SuperBill Details Patient Name: Date of Service: Crystal Lennox. 12/02/2021 Medical Record Number: [id number] Patient Account Number: [id number] Date of Birth/Sex: Treating RN: March 08, 1946 (76 y.o. Debby Bud Primary Care Provider: [name] Other Clinician: Referring Provider: Treating Provider/Extender: [name] Weeks in Treatment: 0 Diagnosis Coding ICD-10 Codes Code Description [phone] Pressure ulcer of left ankle, stage 3 L89.893 Pressure ulcer of other site, stage 3 I73.9 Peripheral vascular disease, unspecified E11.621 Type 2 diabetes mellitus with foot ulcer Facility Procedures CPT4 Code: 21308657 Description: 84696 - WOUND CARE VISIT-LEV 4 EST PT Modifier: Quantity: 1 CPT4 Code: 29528413 Description: 24401 - DEBRIDE W/O ANES NON SELECT Modifier: Quantity: 1 Physician Procedures : CPT4 Code Description Modifier [id number] - WC PHYS LEVEL 3 - EST PT ICD-10 Diagnosis Description L89.523 Pressure ulcer of left ankle, stage 3 L89.893 Pressure ulcer of other site, stage 3 I73.9 Peripheral vascular disease, unspecified E11.621 Type  2 diabetes mellitus with foot ulcer Quantity: 1 Electronic Signature(s) Signed: 12/08/2021 4:42:46 PM By: Kalman Shan DO Previous Signature: 12/02/2021 1:45:50 PM Version By: Kalman Shan DO Previous Signature: 12/02/2021 5:49:10 PM Version By: Deon Pilling RN, BSN Entered By: Kalman Shan on 12/08/2021 16:40:06

## 2022-01-12 DIAGNOSIS — I739 Peripheral vascular disease, unspecified: Secondary | ICD-10-CM | POA: Diagnosis not present

## 2022-01-12 DIAGNOSIS — L89893 Pressure ulcer of other site, stage 3: Secondary | ICD-10-CM | POA: Diagnosis not present

## 2022-01-12 DIAGNOSIS — J449 Chronic obstructive pulmonary disease, unspecified: Secondary | ICD-10-CM | POA: Diagnosis not present

## 2022-01-12 DIAGNOSIS — E11622 Type 2 diabetes mellitus with other skin ulcer: Secondary | ICD-10-CM | POA: Diagnosis not present

## 2022-01-12 DIAGNOSIS — L97328 Non-pressure chronic ulcer of left ankle with other specified severity: Secondary | ICD-10-CM | POA: Diagnosis not present

## 2022-01-12 DIAGNOSIS — E1151 Type 2 diabetes mellitus with diabetic peripheral angiopathy without gangrene: Secondary | ICD-10-CM | POA: Diagnosis not present

## 2022-01-12 DIAGNOSIS — R531 Weakness: Secondary | ICD-10-CM | POA: Diagnosis not present

## 2022-01-12 DIAGNOSIS — J9611 Chronic respiratory failure with hypoxia: Secondary | ICD-10-CM | POA: Diagnosis not present

## 2022-01-12 DIAGNOSIS — Z794 Long term (current) use of insulin: Secondary | ICD-10-CM | POA: Diagnosis not present

## 2022-01-12 DIAGNOSIS — Z7984 Long term (current) use of oral hypoglycemic drugs: Secondary | ICD-10-CM | POA: Diagnosis not present

## 2022-01-20 DIAGNOSIS — E1139 Type 2 diabetes mellitus with other diabetic ophthalmic complication: Secondary | ICD-10-CM | POA: Diagnosis not present

## 2022-01-20 DIAGNOSIS — I10 Essential (primary) hypertension: Secondary | ICD-10-CM | POA: Diagnosis not present

## 2022-01-20 DIAGNOSIS — J449 Chronic obstructive pulmonary disease, unspecified: Secondary | ICD-10-CM | POA: Diagnosis not present

## 2022-01-26 DIAGNOSIS — I6350 Cerebral infarction due to unspecified occlusion or stenosis of unspecified cerebral artery: Secondary | ICD-10-CM | POA: Diagnosis not present

## 2022-01-26 DIAGNOSIS — J439 Emphysema, unspecified: Secondary | ICD-10-CM | POA: Diagnosis not present

## 2022-01-26 DIAGNOSIS — R531 Weakness: Secondary | ICD-10-CM | POA: Diagnosis not present

## 2022-02-19 DIAGNOSIS — J449 Chronic obstructive pulmonary disease, unspecified: Secondary | ICD-10-CM | POA: Diagnosis not present

## 2022-02-19 DIAGNOSIS — I10 Essential (primary) hypertension: Secondary | ICD-10-CM | POA: Diagnosis not present

## 2022-02-19 DIAGNOSIS — E1139 Type 2 diabetes mellitus with other diabetic ophthalmic complication: Secondary | ICD-10-CM | POA: Diagnosis not present

## 2022-02-24 DIAGNOSIS — R531 Weakness: Secondary | ICD-10-CM | POA: Diagnosis not present

## 2022-02-24 DIAGNOSIS — I6350 Cerebral infarction due to unspecified occlusion or stenosis of unspecified cerebral artery: Secondary | ICD-10-CM | POA: Diagnosis not present

## 2022-02-26 DIAGNOSIS — J439 Emphysema, unspecified: Secondary | ICD-10-CM | POA: Diagnosis not present

## 2022-02-27 DIAGNOSIS — Z0000 Encounter for general adult medical examination without abnormal findings: Secondary | ICD-10-CM | POA: Diagnosis not present

## 2022-02-27 DIAGNOSIS — E1139 Type 2 diabetes mellitus with other diabetic ophthalmic complication: Secondary | ICD-10-CM | POA: Diagnosis not present

## 2022-02-27 DIAGNOSIS — E1165 Type 2 diabetes mellitus with hyperglycemia: Secondary | ICD-10-CM | POA: Diagnosis not present

## 2022-02-27 DIAGNOSIS — D500 Iron deficiency anemia secondary to blood loss (chronic): Secondary | ICD-10-CM | POA: Diagnosis not present

## 2022-02-27 DIAGNOSIS — E11621 Type 2 diabetes mellitus with foot ulcer: Secondary | ICD-10-CM | POA: Diagnosis not present

## 2022-02-27 DIAGNOSIS — Z7189 Other specified counseling: Secondary | ICD-10-CM | POA: Diagnosis not present

## 2022-02-27 DIAGNOSIS — I10 Essential (primary) hypertension: Secondary | ICD-10-CM | POA: Diagnosis not present

## 2022-02-27 DIAGNOSIS — I69919 Unspecified symptoms and signs involving cognitive functions following unspecified cerebrovascular disease: Secondary | ICD-10-CM | POA: Diagnosis not present

## 2022-02-27 DIAGNOSIS — I739 Peripheral vascular disease, unspecified: Secondary | ICD-10-CM | POA: Diagnosis not present

## 2022-02-27 DIAGNOSIS — I69354 Hemiplegia and hemiparesis following cerebral infarction affecting left non-dominant side: Secondary | ICD-10-CM | POA: Diagnosis not present

## 2022-03-09 ENCOUNTER — Telehealth: Payer: Self-pay

## 2022-03-09 DIAGNOSIS — I739 Peripheral vascular disease, unspecified: Secondary | ICD-10-CM

## 2022-03-09 DIAGNOSIS — M79604 Pain in right leg: Secondary | ICD-10-CM

## 2022-03-09 NOTE — Progress Notes (Unsigned)
ASSESSMENT & PLAN:  76 y.o. female with atherosclerosis of native arteries of bilateral lower extremities causing gangrene status post:  08/30/20: right SFA / popliteal stenting (6x120 Eluvia x 3) 09/04/20: left SAFARI with SFA / popliteal stenting (58mm Innova stents x 4)  Recommend the following which can slow the progression of atherosclerosis and reduce the risk of major adverse cardiac / limb events:  Complete cessation from all tobacco products. Blood glucose control with goal A1c < 7%. Blood pressure control with goal blood pressure < 140/90 mmHg. Lipid reduction therapy with goal LDL-C <100 mg/dL (<70 if symptomatic from PAD).  Aspirin 81mg  PO QD.  Clopidogrel 75mg  PO QD. Atorvastatin 40-80mg  PO QD (or other "high intensity" statin therapy).  Unfortunately, both stenting efforts have thrombosed.  She has developed chronic limb threatening ischemia of the left lower extremity.  The only thing we have left to offer her is a palliative left below-knee amputation.  I reviewed this with her grandson, who is understandably upset but understanding.  Should her pain become unbearable, we can pursue above-knee amputation, otherwise I recommend palliative care.  She can follow-up with me as needed.  CHIEF COMPLAINT:   Post op  HISTORY:  HISTORY OF PRESENT ILLNESS: Crystal Moore is a 76 y.o. female well known to me for whom I performed staged endovascular recanalizations of bilateral long segment SFA/popliteal lesions causing gangrene in early February 2022. At that time her ABI was R 0.5 / L 0.57. She suffered an upper GI bleed in late February. Her dual antiplatelet therapy was scaled back to ASA only. She returns to clinic for evaluation. She has no complaints.   03/10/22: Returns to clinic with her grandson and live-in nurse.  Patient has developed new ulcers about her left lateral foot.  The patient is no longer able to ambulate and has having increasing difficulty with standing.  Per  family report, she was ambulating immediately after revascularization efforts.  The patient has deteriorated significantly since I saw her last.  She is essentially nonverbal with me but does interact with her family a bit.  She is seated in a wheelchair.  Past Medical History:  Diagnosis Date   Arthritis    COPD (chronic obstructive pulmonary disease) ([place])    COVID-19 virus infection 07/2020   CVA (cerebral vascular accident) ([place])    Diabetes mellitus without complication ([place])    Hemiparesis affecting left side as late effect of cerebrovascular accident (CVA) ([place])    Pyelonephritis    Seizures ([place])    Stroke ([place])    Tobacco use disorder    UTI (urinary tract infection)     Past Surgical History:  Procedure Laterality Date   ABDOMINAL AORTOGRAM W/LOWER EXTREMITY N/A 08/30/2020   Procedure: ABDOMINAL AORTOGRAM W/LOWER EXTREMITY;  Surgeon: Cherre Robins, MD;  Location: [facility];  Service: Cardiovascular;  Laterality: N/A;   ABDOMINAL AORTOGRAM W/LOWER EXTREMITY N/A 09/04/2020   Procedure: ABDOMINAL AORTOGRAM W/LOWER EXTREMITY;  Surgeon: Cherre Robins, MD;  Location: [facility];  Service: Cardiovascular;  Laterality: N/A;   ESOPHAGOGASTRODUODENOSCOPY (EGD) WITH PROPOFOL N/A 09/16/2020   Procedure: ESOPHAGOGASTRODUODENOSCOPY (EGD) WITH PROPOFOL;  Surgeon: Lavena Bullion, DO;  Location: [facility];  Service: Gastroenterology;  Laterality: N/A;   HEMOSTASIS CLIP PLACEMENT  09/16/2020   Procedure: HEMOSTASIS CLIP PLACEMENT;  Surgeon: Lavena Bullion, DO;  Location: [facility];  Service: Gastroenterology;;   HOT HEMOSTASIS N/A 09/16/2020   Procedure: HOT HEMOSTASIS (ARGON PLASMA COAGULATION/BICAP);  Surgeon: Lavena Bullion, DO;  Location: [facility];  Service: Gastroenterology;  Laterality: N/A;   PERIPHERAL VASCULAR INTERVENTION Right 08/30/2020   Procedure: PERIPHERAL VASCULAR INTERVENTION;  Surgeon: Cherre Robins, MD;  Location: [facility];   Service: Cardiovascular;  Laterality: Right;  femoral popliteal   PERIPHERAL VASCULAR INTERVENTION Left 09/04/2020   Procedure: PERIPHERAL VASCULAR INTERVENTION;  Surgeon: Cherre Robins, MD;  Location: [facility];  Service: Cardiovascular;  Laterality: Left;  SFA   TUBAL LIGATION      Family History  Problem Relation Age of Onset   Diabetes Mother    Diabetes Sister    Diabetes Brother     Social History   Socioeconomic History   Marital status: Single    Spouse name: Not on file   Number of children: Not on file   Years of education: Not on file   Highest education level: Not on file  Occupational History   Not on file  Tobacco Use   Smoking status: Every Day    Packs/day: 1.00    Years: 40.00    Total pack years: 40.00    Types: Cigarettes   Smokeless tobacco: Never  Vaping Use   Vaping Use: Never used  Substance and Sexual Activity   Alcohol use: Never   Drug use: Never   Sexual activity: Not on file  Other Topics Concern   Not on file  Social History Narrative   ** Merged History Encounter **   Right handed    Lives with family        Social Determinants of Health   Financial Resource Strain: Not on file  Food Insecurity: Not on file  Transportation Needs: Not on file  Physical Activity: Not on file  Stress: Not on file  Social Connections: Not on file  Intimate Partner Violence: Not on file    Allergies  Allergen Reactions   Aspirin     Can not take due to previous GI bleed - per daughter    Current Outpatient Medications  Medication Sig Dispense Refill   ACCU-CHEK GUIDE test strip USE TO test AS DIRECTED TWICE DAILY 200 strip 2   acetaminophen (TYLENOL) 500 MG tablet Take 1,000 mg by mouth every 6 (six) hours as needed (pain).     albuterol (PROVENTIL) (2.5 MG/3ML) 0.083% nebulizer solution Inhale 3 mLs (2.5 mg total) into the lungs every 4 (four) hours as needed for wheezing or shortness of breath. 75 mL 12   albuterol (VENTOLIN HFA)  108 (90 Base) MCG/ACT inhaler Inhale 1 puff into the lungs every 6 (six) hours as needed for wheezing or shortness of breath.     amLODipine (NORVASC) 5 MG tablet Take 5 mg by mouth daily.     atorvastatin (LIPITOR) 80 MG tablet Take 1 tablet (80 mg total) by mouth daily at 6 PM. (Patient taking differently: Take 80 mg by mouth every evening.) 30 tablet 0   clopidogrel (PLAVIX) 75 MG tablet Take 1 tablet (75 mg total) by mouth daily. 30 tablet 11   feeding supplement (ENSURE ENLIVE / ENSURE PLUS) LIQD Take 237 mLs by mouth 2 (two) times daily between meals. 237 mL 12   gabapentin (NEURONTIN) 300 MG capsule Take 1 capsule (300 mg total) by mouth 3 (three) times daily. 30 capsule 3   insulin glargine (LANTUS) 100 UNIT/ML injection Inject 0.15 mLs (15 Units total) into the skin daily. (Patient taking differently: Inject 25 Units into the skin daily.) 10 mL  11   linagliptin (TRADJENTA) 5 MG TABS tablet Take 5 mg by mouth daily.     pantoprazole (PROTONIX) 40 MG tablet Take 1 tablet (40 mg total) by mouth 2 (two) times daily. 84 tablet 0   No current facility-administered medications for this visit.   PHYSICAL EXAM:   Vitals:   03/10/22 1439  BP: (!) 146/57  Pulse: 84  Resp: 20  Temp: 98.3 F (36.8 C)  SpO2: 92%  Weight: 64.4 kg  Height: 5\' 2"  (1.575 m)    Constitutional: Chronically ill.  In a wheelchair.  Left foot contracted. Cardiac: regular rate and rhythm.  Respiratory: unlabored. Peripheral vascular:  No pedal pulses  Left lateral foot with 2 areas of ischemic ulceration  DATA REVIEW:    Most recent CBC    Latest Ref Rng & Units 06/29/2021    4:53 AM 06/27/2021   10:50 AM 06/27/2021    9:51 AM  CBC  WBC 4.0 - 10.5 K/uL 6.2  8.9    Hemoglobin 12.0 - 15.0 g/dL 10.6  13.2  14.6   Hematocrit 36.0 - 46.0 % 32.1  41.9  43.0   Platelets 150 - 400 K/uL 179  210       Most recent CMP    Latest Ref Rng & Units 06/29/2021    4:53 AM 06/27/2021    9:16 PM 06/27/2021    9:51 AM   CMP  Glucose 70 - 99 mg/dL 115  200  147   BUN 8 - 23 mg/dL 17  17  23    Creatinine 0.44 - 1.00 mg/dL 0.92  0.96  1.10   Sodium 135 - 145 mmol/L 136  135  140   Potassium 3.5 - 5.1 mmol/L 3.4  3.6  4.2   Chloride 98 - 111 mmol/L 103  103  102   CO2 22 - 32 mmol/L 26  23    Calcium 8.9 - 10.3 mg/dL 8.2  8.0    Total Protein 6.5 - 8.1 g/dL  6.2    Total Bilirubin 0.3 - 1.2 mg/dL  0.6    Alkaline Phos 38 - 126 U/L  73    AST 15 - 41 U/L  37    ALT 0 - 44 U/L  28      Renal function CrCl cannot be calculated (Patient's most recent lab result is older than the maximum 21 days allowed.).  Hgb A1c MFr Bld (%)  Date Value  06/28/2021 8.8 (H)    LDL Cholesterol  Date Value Ref Range Status  06/28/2021 46 0 - 99 mg/dL Final    Comment:           Total Cholesterol/HDL:CHD Risk Coronary Heart Disease Risk Table                     Men   Women  1/2 Average Risk   3.4   3.3  Average Risk       5.0   4.4  2 X Average Risk   9.6   7.1  3 X Average Risk  23.4   11.0        Use the calculated Patient Ratio above and the CHD Risk Table to determine the patient's CHD Risk.        ATP III CLASSIFICATION (LDL):  <100     mg/dL   Optimal  100-129  mg/dL   Near or Above  Optimal  130-159  mg/dL   Borderline  160-189  mg/dL   High  >190     mg/dL   Very High Performed at [facility] [address] [place], [place] [address]      Vascular Imaging: +-------+-----------+-----------+------------+------------+  ABI/TBIToday's ABIToday's TBIPrevious ABIPrevious TBI  +-------+-----------+-----------+------------+------------+  Right  0.46                                            +-------+-----------+-----------+------------+------------+  Left   0.56                                            +-------+-----------+-----------+------------+------------+   Duplex shows stents in both legs are occluded  [name], MD [facility] Phone Number: [phone] 03/10/2022 4:58 PM

## 2022-03-09 NOTE — Telephone Encounter (Signed)
Pt's daughter, [name], called stating the pt was having severe R leg pain, the same leg that had stents placed before.  Reviewed pt's chart, returned call for clarification, two identifiers used. She states that the pt's entire R leg hurts constantly, to the point that she is not able to walk on it. She is now using a walked/wheelchair instead of a cane. She has intermittent swelling of her feet and she elevates them at night. She denies any discoloration, but she does have 2 sores on her R foot that are extremely slow to heal and have been there for over a year. Her feet are intermittently cold and she does not use compression. Pt was last seen on 4/22 with a 5 month f/u never scheduled. Appts for [place] and [place] scheduled. Confirmed understanding.

## 2022-03-10 ENCOUNTER — Encounter: Payer: Self-pay | Admitting: Vascular Surgery

## 2022-03-10 ENCOUNTER — Ambulatory Visit (HOSPITAL_COMMUNITY)
Admission: RE | Admit: 2022-03-10 | Discharge: 2022-03-10 | Disposition: A | Discharge status: Home / Self Care | Payer: Medicare Other | Source: Ambulatory Visit | Attending: Vascular Surgery | Admitting: Vascular Surgery

## 2022-03-10 ENCOUNTER — Ambulatory Visit (INDEPENDENT_AMBULATORY_CARE_PROVIDER_SITE_OTHER): Payer: Medicare Other | Admitting: Vascular Surgery

## 2022-03-10 ENCOUNTER — Ambulatory Visit (INDEPENDENT_AMBULATORY_CARE_PROVIDER_SITE_OTHER)
Admission: RE | Admit: 2022-03-10 | Discharge: 2022-03-10 | Disposition: A | Discharge status: Home / Self Care | Payer: Medicare Other | Source: Ambulatory Visit | Attending: Vascular Surgery | Admitting: Vascular Surgery

## 2022-03-10 VITALS — BP 146/57 | HR 84 | Temp 98.3°F | Resp 20 | Ht 62.0 in | Wt 142.0 lb

## 2022-03-10 DIAGNOSIS — M79604 Pain in right leg: Secondary | ICD-10-CM | POA: Diagnosis not present

## 2022-03-10 DIAGNOSIS — I739 Peripheral vascular disease, unspecified: Secondary | ICD-10-CM | POA: Diagnosis not present

## 2022-03-11 ENCOUNTER — Telehealth: Payer: Self-pay

## 2022-03-11 ENCOUNTER — Telehealth: Payer: Self-pay | Admitting: *Deleted

## 2022-03-11 NOTE — Telephone Encounter (Signed)
Pt's daughter called stating that she is seeking a referral for a 2nd opinion. The pt saw Dr Stanford Breed yesterday and she understands his offered plan, but she wants to exhaust other options. She is requesting a referral to [facility] in [place], [place]. Gave information to [place] to make referral.

## 2022-03-11 NOTE — Telephone Encounter (Signed)
Patient's daughter called and left message on triage line to call her.  Returned  call to [phone] at 1:49 PM no voicemail had been set to leave message.

## 2022-03-12 ENCOUNTER — Telehealth: Payer: Self-pay | Admitting: Vascular Surgery

## 2022-03-12 NOTE — Telephone Encounter (Signed)
Spoke with daughter regarding referral she wanted for her mother at [facility] for a second opinion after her appt with Dr. Stanford Breed. I explained that the radiology just does MRI, CT, etc and there are no doctors there to have an appt with. She said she wants to go to some vascular office in the [place] area and she will call me back with info

## 2022-03-13 ENCOUNTER — Telehealth: Payer: Self-pay | Admitting: Vascular Surgery

## 2022-03-13 NOTE — Telephone Encounter (Signed)
Pt mother had appt with Dr. Stanford Breed and wanted a second opinion on her mother. She wanted several places that do not handle anything vascular at all. She wanted something in [place]. I gave her a vascular clinic in [facility]. She said she wanted to get her PCP to send the referral and if they wouldn't then she'd call us back.

## 2022-03-18 DIAGNOSIS — E11621 Type 2 diabetes mellitus with foot ulcer: Secondary | ICD-10-CM | POA: Diagnosis not present

## 2022-03-24 DIAGNOSIS — E1139 Type 2 diabetes mellitus with other diabetic ophthalmic complication: Secondary | ICD-10-CM | POA: Diagnosis not present

## 2022-03-24 DIAGNOSIS — I10 Essential (primary) hypertension: Secondary | ICD-10-CM | POA: Diagnosis not present

## 2022-03-24 DIAGNOSIS — J449 Chronic obstructive pulmonary disease, unspecified: Secondary | ICD-10-CM | POA: Diagnosis not present

## 2022-03-26 DIAGNOSIS — I739 Peripheral vascular disease, unspecified: Secondary | ICD-10-CM | POA: Diagnosis not present

## 2022-03-29 DIAGNOSIS — J439 Emphysema, unspecified: Secondary | ICD-10-CM | POA: Diagnosis not present

## 2022-04-06 DIAGNOSIS — E785 Hyperlipidemia, unspecified: Secondary | ICD-10-CM | POA: Diagnosis not present

## 2022-04-06 DIAGNOSIS — E1151 Type 2 diabetes mellitus with diabetic peripheral angiopathy without gangrene: Secondary | ICD-10-CM | POA: Diagnosis not present

## 2022-04-06 DIAGNOSIS — I10 Essential (primary) hypertension: Secondary | ICD-10-CM | POA: Diagnosis not present

## 2022-04-06 DIAGNOSIS — Z794 Long term (current) use of insulin: Secondary | ICD-10-CM | POA: Diagnosis not present

## 2022-04-06 DIAGNOSIS — L97509 Non-pressure chronic ulcer of other part of unspecified foot with unspecified severity: Secondary | ICD-10-CM | POA: Diagnosis not present

## 2022-04-06 DIAGNOSIS — I739 Peripheral vascular disease, unspecified: Secondary | ICD-10-CM | POA: Diagnosis not present

## 2022-04-06 DIAGNOSIS — E11621 Type 2 diabetes mellitus with foot ulcer: Secondary | ICD-10-CM | POA: Diagnosis not present

## 2022-04-10 DIAGNOSIS — L97521 Non-pressure chronic ulcer of other part of left foot limited to breakdown of skin: Secondary | ICD-10-CM | POA: Diagnosis not present

## 2022-04-10 DIAGNOSIS — I739 Peripheral vascular disease, unspecified: Secondary | ICD-10-CM | POA: Diagnosis not present

## 2022-04-22 DIAGNOSIS — I10 Essential (primary) hypertension: Secondary | ICD-10-CM | POA: Diagnosis not present

## 2022-04-22 DIAGNOSIS — E1139 Type 2 diabetes mellitus with other diabetic ophthalmic complication: Secondary | ICD-10-CM | POA: Diagnosis not present

## 2022-04-22 DIAGNOSIS — J449 Chronic obstructive pulmonary disease, unspecified: Secondary | ICD-10-CM | POA: Diagnosis not present

## 2022-04-28 DIAGNOSIS — J439 Emphysema, unspecified: Secondary | ICD-10-CM | POA: Diagnosis not present

## 2022-04-29 ENCOUNTER — Telehealth: Payer: Self-pay

## 2022-04-29 NOTE — Patient Outreach (Signed)
  Care Coordination   04/29/2022 Name: Crystal Moore MRN: [id number] DOB: 04/21/1946   Care Coordination Outreach Attempts:  An unsuccessful telephone outreach was attempted today to offer the patient information about available care coordination services as a benefit of their health plan.   Follow Up Plan:  Additional outreach attempts will be made to offer the patient care coordination information and services.   Encounter Outcome:  No Answer  Care Coordination Interventions Activated:  No   Care Coordination Interventions:  No, not indicated    [facility] [phone]

## 2022-04-29 NOTE — Patient Outreach (Signed)
  Care Coordination   Initial Visit Note   04/29/2022 Name: SUHA SCHOENBECK MRN: [id number] DOB: 07-19-1946  GENAE STRINE is a 76 y.o. year old female who sees [name], MD for primary care. Call returned. I spoke with  Marice Potter Utke's daughter/caregiver [name] by phone today.  What matters to the patients health and wellness today?  No Concerns Expressed by patient's daughter.   Goals Addressed             This Visit's Progress    COMPLETED: Care Coordination Activties       Care Coordination Interventions: Reviewed plan for disease management with patient's daughter/caregiver. Discussed plan for wound care. Daughter reports wound/ulcer to left extremity is healing and improving. Reports completing daily dressing changes at home. Reports patient is also being followed closely by the [facility] Vascular team. Discussed plan for diabetes management. Daughter reports significant improvements with patient's dietary intake. Reports fasting blood glucose readings have been within range. Reports A1C will be drawn later this month. Reports pending eye exam. Reviewed medications. Daughter reports managing well. Denies concerns r/t medication management or prescription cost. Assessed social determinant of health barriers. AWV up to date. Completed on 02/27/22.           SDOH assessments and interventions completed:  Yes  SDOH Interventions Today    Flowsheet Row Most Recent Value  SDOH Interventions   Food Insecurity Interventions Intervention Not Indicated  Transportation Interventions Intervention Not Indicated        Care Coordination Interventions Activated:  Yes  Care Coordination Interventions:  Yes, provided   Follow up plan:  Ms. Wilcoxson daughter agreed to call for care coordination assistance if needed.    Encounter Outcome:  Pt. Visit Completed   [facility] Management [phone]

## 2022-05-01 DIAGNOSIS — L97521 Non-pressure chronic ulcer of other part of left foot limited to breakdown of skin: Secondary | ICD-10-CM | POA: Diagnosis not present

## 2022-05-01 DIAGNOSIS — I739 Peripheral vascular disease, unspecified: Secondary | ICD-10-CM | POA: Diagnosis not present

## 2022-05-04 NOTE — Patient Instructions (Signed)
Visit Information  Thank you for taking time to speak with me. Please do not hesitate to call if additional assistance is needed.  Following are the goals we discussed today:   Goals Addressed             This Visit's Progress    COMPLETED: Care Coordination Activties       Care Coordination Interventions: Reviewed plan for disease management with patient's daughter/caregiver. Discussed plan for wound care. Daughter reports wound/ulcer to left extremity is healing and improving. Reports completing daily dressing changes at home. Reports patient is also being followed closely by the [facility] Vascular team. Discussed plan for diabetes management. Daughter reports significant improvements with patient's dietary intake. Reports fasting blood glucose readings have been within range. Reports A1C will be drawn later this month. Reports pending eye exam. Reviewed medications. Daughter reports managing well. Denies concerns r/t medication management or prescription cost. Assessed social determinant of health barriers. AWV up to date. Completed on 02/27/22.         Ms. Barile daughter/caregiver [name] verbalized understanding of the information discussed during the telephonic outreach. Declined need for mailed instructions or resources.  Ms. Zemanek daughter/caregiver will call for care coordination assistance if needed.  [place] [phone]

## 2022-05-05 DIAGNOSIS — E11621 Type 2 diabetes mellitus with foot ulcer: Secondary | ICD-10-CM | POA: Diagnosis not present

## 2022-05-20 DIAGNOSIS — E1139 Type 2 diabetes mellitus with other diabetic ophthalmic complication: Secondary | ICD-10-CM | POA: Diagnosis not present

## 2022-05-20 DIAGNOSIS — J449 Chronic obstructive pulmonary disease, unspecified: Secondary | ICD-10-CM | POA: Diagnosis not present

## 2022-05-20 DIAGNOSIS — I10 Essential (primary) hypertension: Secondary | ICD-10-CM | POA: Diagnosis not present

## 2022-05-29 DIAGNOSIS — J439 Emphysema, unspecified: Secondary | ICD-10-CM | POA: Diagnosis not present

## 2022-06-24 DIAGNOSIS — E1139 Type 2 diabetes mellitus with other diabetic ophthalmic complication: Secondary | ICD-10-CM | POA: Diagnosis not present

## 2022-06-24 DIAGNOSIS — I10 Essential (primary) hypertension: Secondary | ICD-10-CM | POA: Diagnosis not present

## 2022-06-24 DIAGNOSIS — D649 Anemia, unspecified: Secondary | ICD-10-CM

## 2022-06-24 DIAGNOSIS — D500 Iron deficiency anemia secondary to blood loss (chronic): Secondary | ICD-10-CM | POA: Diagnosis not present

## 2022-06-24 DIAGNOSIS — J449 Chronic obstructive pulmonary disease, unspecified: Secondary | ICD-10-CM | POA: Diagnosis not present

## 2022-06-24 HISTORY — DX: Anemia, unspecified: D64.9

## 2022-06-28 DIAGNOSIS — J439 Emphysema, unspecified: Secondary | ICD-10-CM | POA: Diagnosis not present

## 2022-07-29 DIAGNOSIS — J439 Emphysema, unspecified: Secondary | ICD-10-CM | POA: Diagnosis not present

## 2022-08-08 IMAGING — DX DG ANKLE PORT 2V*L*
3 series · 3 of 3 positions shown · non-contrast
Comparison: None.

CLINICAL DATA: Osteomyelitis.

EXAM:
PORTABLE LEFT ANKLE - 2 VIEW

[ankle ap]
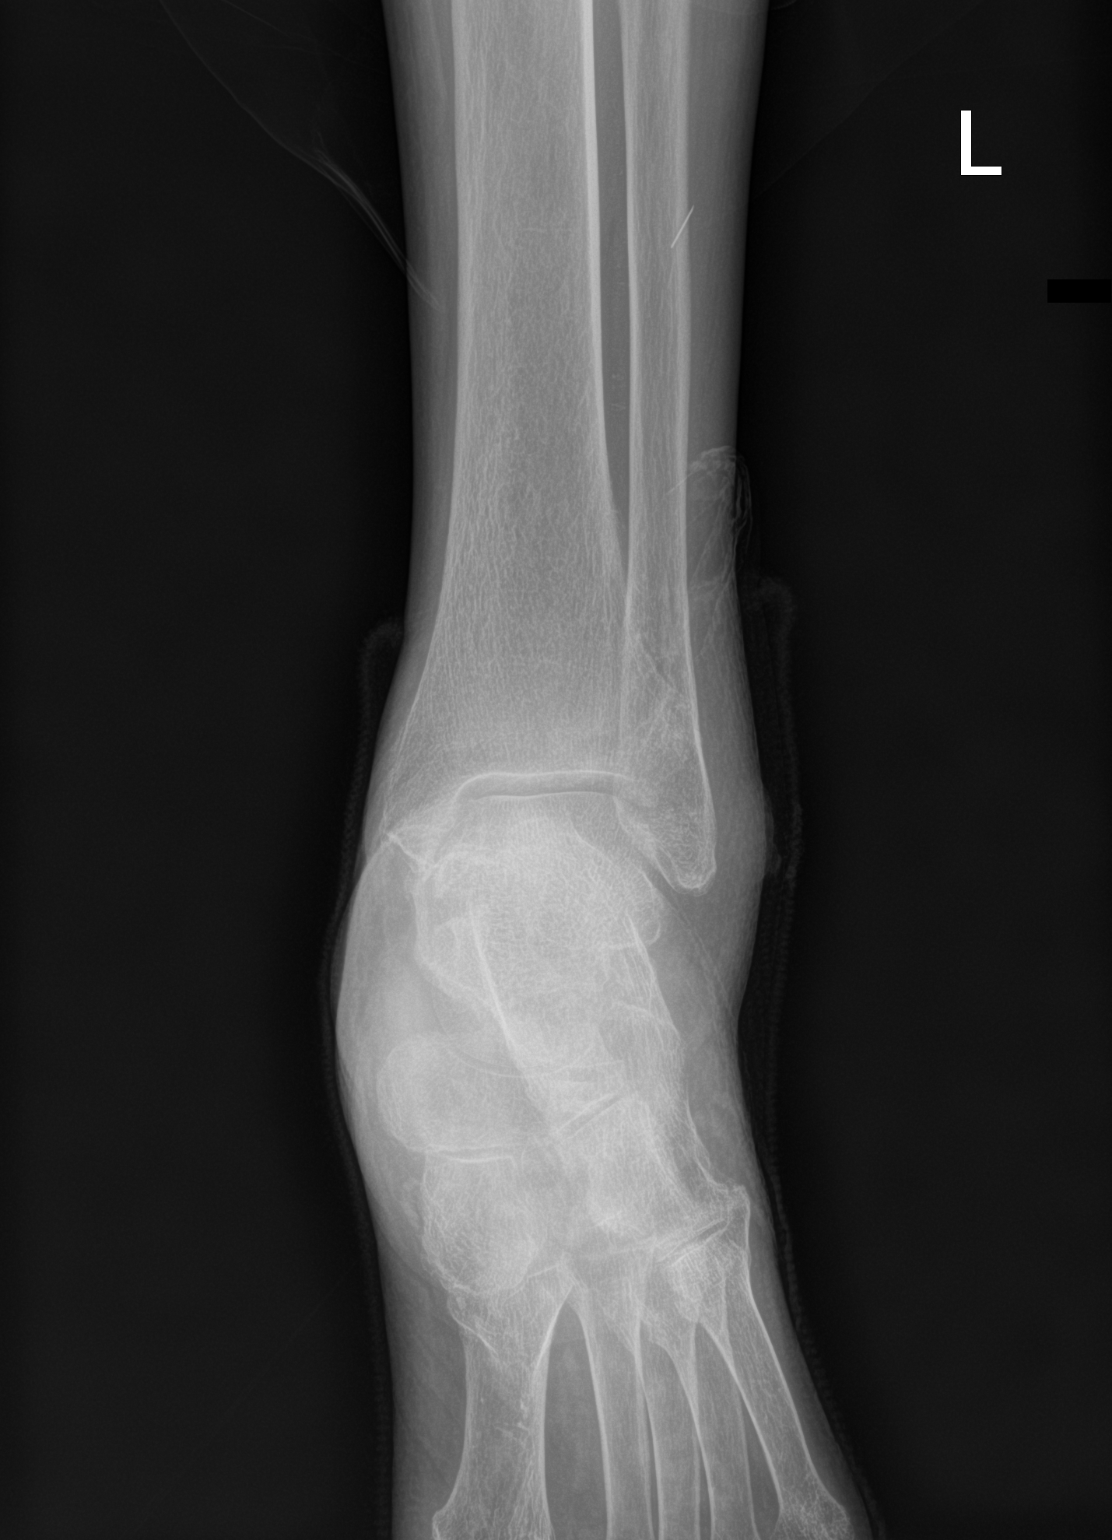

[ankle lat]
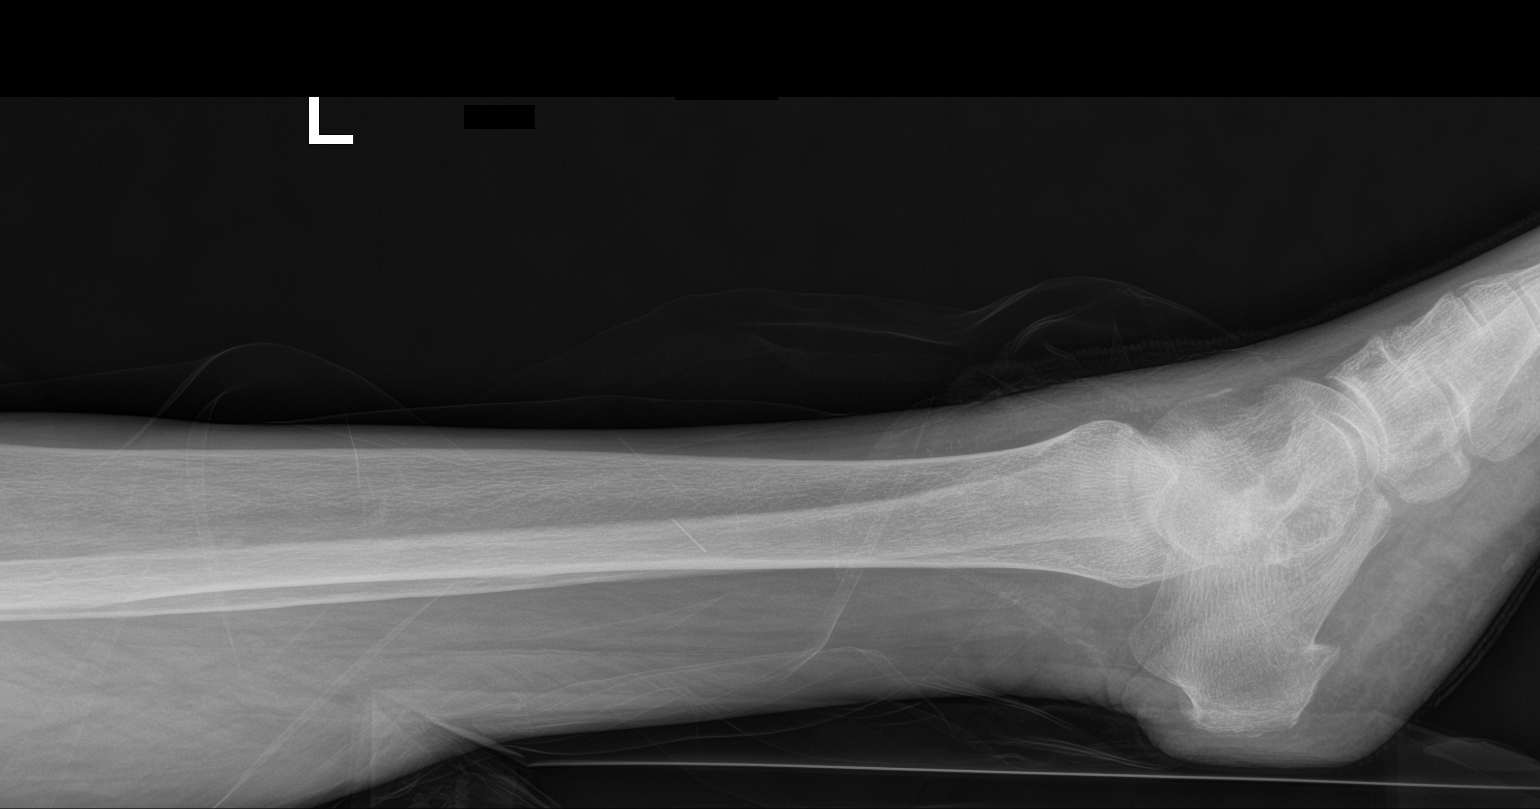

[ankle obl]
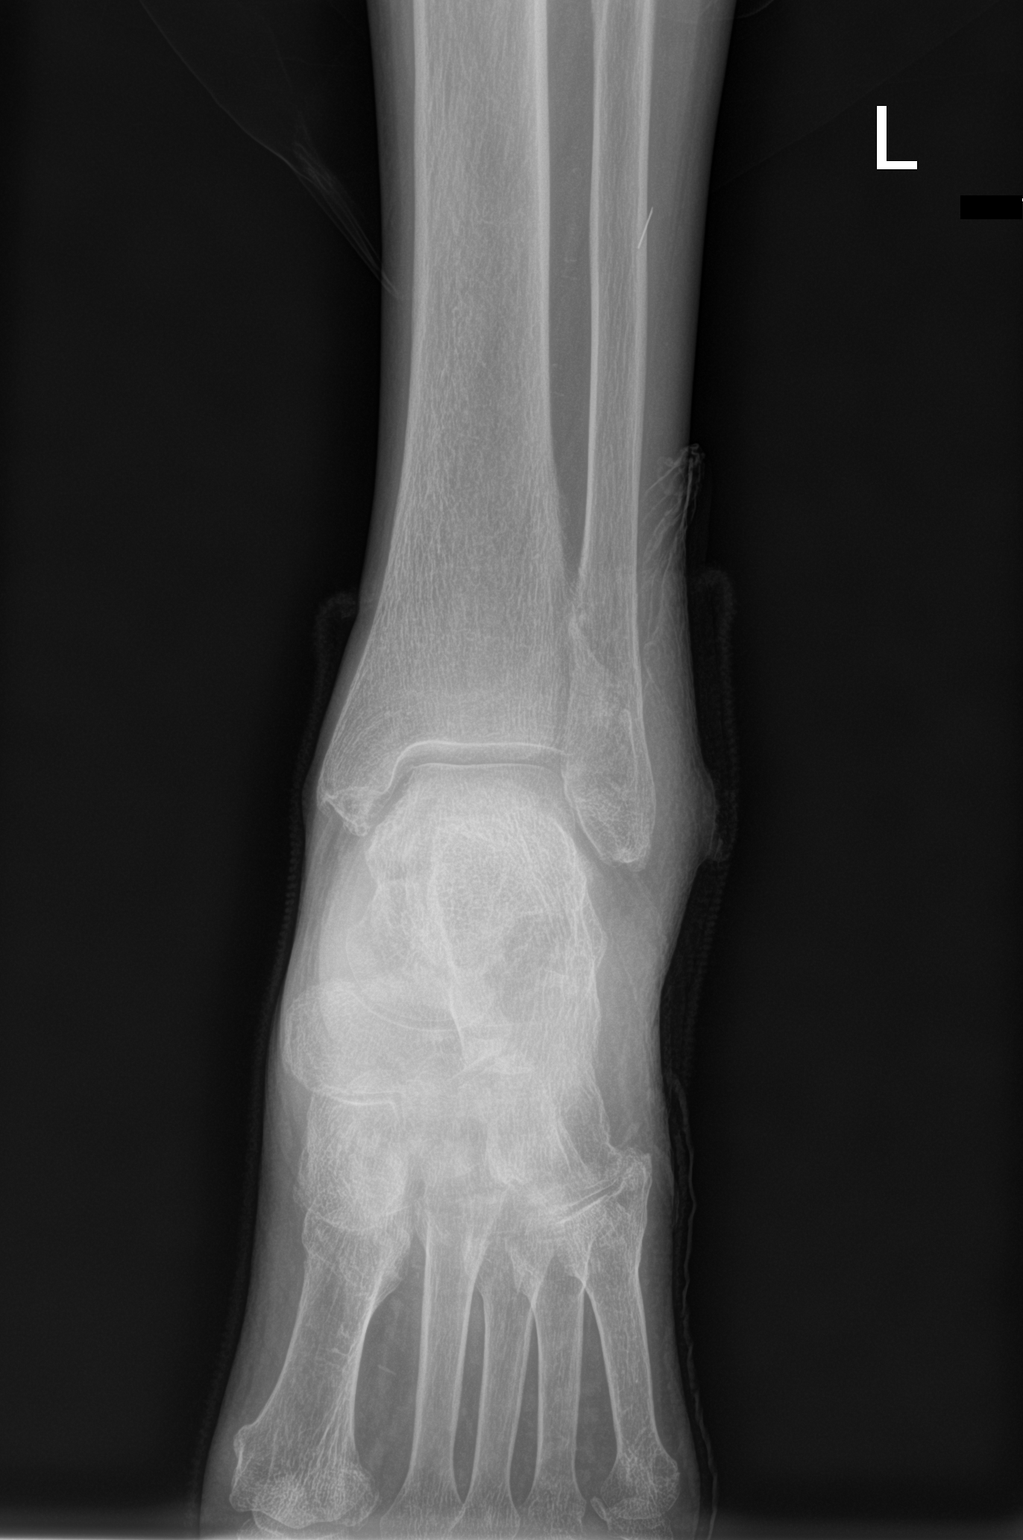

[3 of 3 positions shown; findings below may reference images not displayed]

FINDINGS: There is no evidence of fracture, dislocation, or joint effusion.
There is no evidence of arthropathy or other focal bone abnormality.
No lytic destruction is seen to suggest osteomyelitis. Soft tissue
swelling is seen over lateral malleolus.
IMPRESSION: No fracture or dislocation is noted. No lytic destruction is seen to
suggest osteomyelitis. Soft tissue swelling is seen over lateral
malleolus.

## 2022-08-25 IMAGING — DX DG FOOT 2V*R*
2 series · 2 of 2 positions shown · non-contrast
Comparison: None.

CLINICAL DATA: First toe infection

EXAM:
RIGHT FOOT - 2 VIEW

[foot ap]
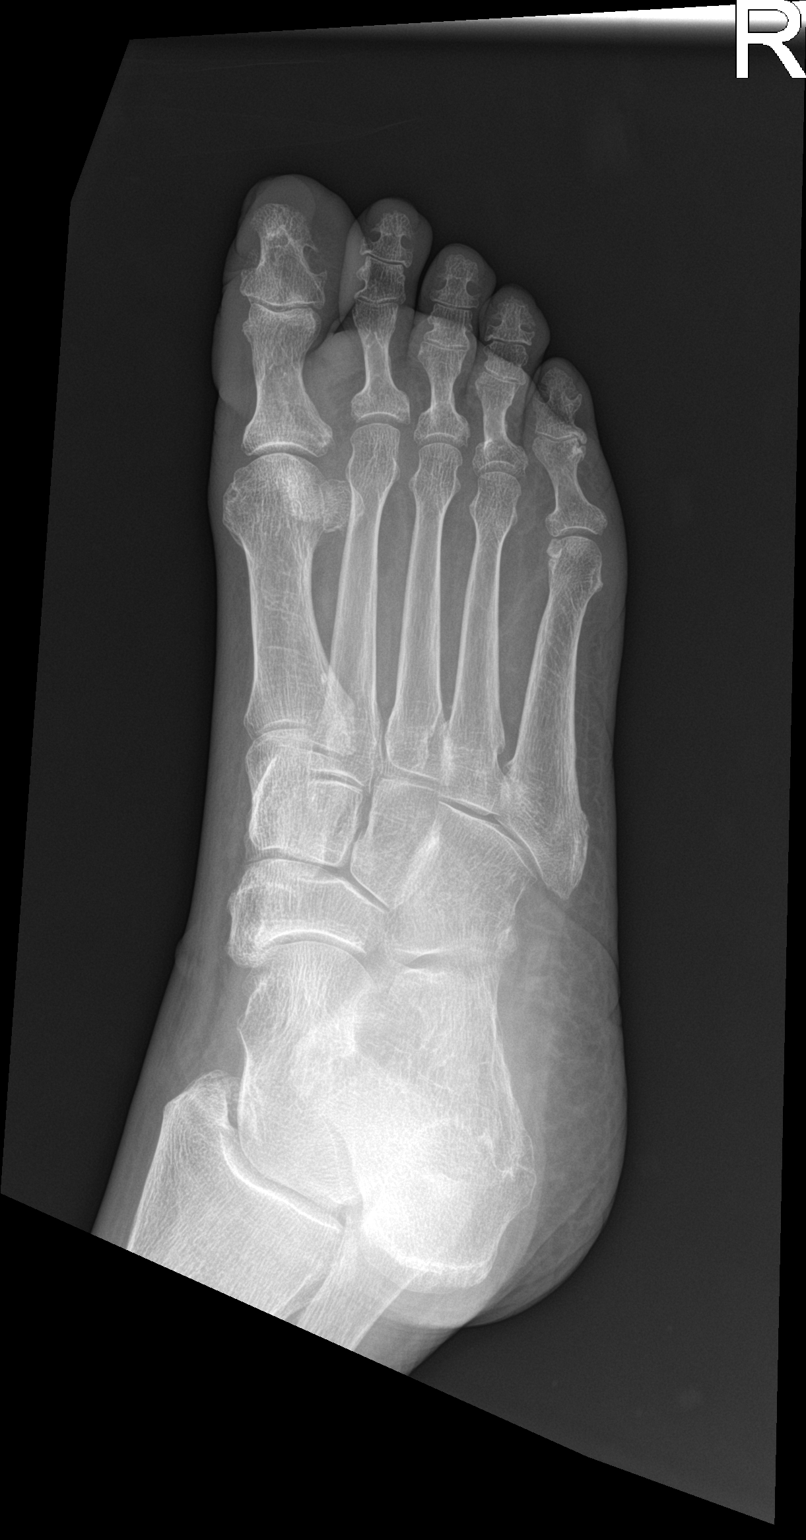

[foot lat]
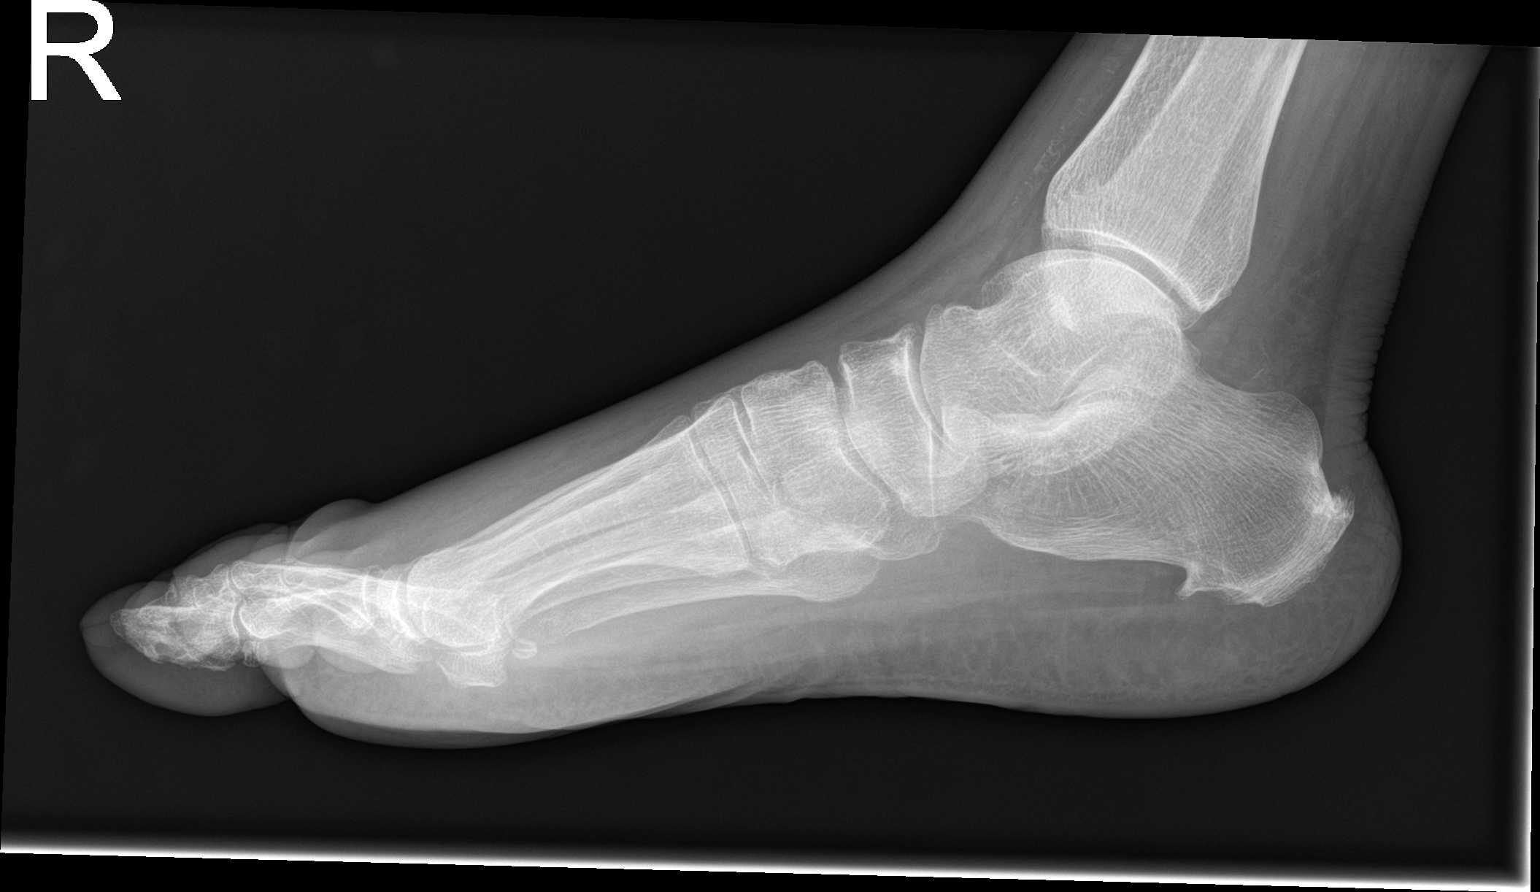

[2 of 2 positions shown; findings below may reference images not displayed]

FINDINGS: No acute fracture or dislocation is noted. No gross soft tissue
abnormality is seen. Calcaneal spurring is noted.
IMPRESSION: No acute abnormality seen.

## 2022-08-29 DIAGNOSIS — J439 Emphysema, unspecified: Secondary | ICD-10-CM | POA: Diagnosis not present

## 2022-09-08 DIAGNOSIS — E1165 Type 2 diabetes mellitus with hyperglycemia: Secondary | ICD-10-CM | POA: Diagnosis not present

## 2022-09-08 DIAGNOSIS — R262 Difficulty in walking, not elsewhere classified: Secondary | ICD-10-CM | POA: Diagnosis not present

## 2022-09-08 DIAGNOSIS — I739 Peripheral vascular disease, unspecified: Secondary | ICD-10-CM | POA: Diagnosis not present

## 2022-09-08 DIAGNOSIS — I69354 Hemiplegia and hemiparesis following cerebral infarction affecting left non-dominant side: Secondary | ICD-10-CM | POA: Diagnosis not present

## 2022-09-08 DIAGNOSIS — E11621 Type 2 diabetes mellitus with foot ulcer: Secondary | ICD-10-CM | POA: Diagnosis not present

## 2022-09-08 DIAGNOSIS — J449 Chronic obstructive pulmonary disease, unspecified: Secondary | ICD-10-CM | POA: Diagnosis not present

## 2022-09-08 DIAGNOSIS — E1151 Type 2 diabetes mellitus with diabetic peripheral angiopathy without gangrene: Secondary | ICD-10-CM | POA: Diagnosis not present

## 2022-09-17 DIAGNOSIS — J449 Chronic obstructive pulmonary disease, unspecified: Secondary | ICD-10-CM | POA: Diagnosis not present

## 2022-09-17 DIAGNOSIS — E1139 Type 2 diabetes mellitus with other diabetic ophthalmic complication: Secondary | ICD-10-CM | POA: Diagnosis not present

## 2022-09-17 DIAGNOSIS — I10 Essential (primary) hypertension: Secondary | ICD-10-CM | POA: Diagnosis not present

## 2022-09-22 ENCOUNTER — Inpatient Hospital Stay (HOSPITAL_COMMUNITY): Payer: 59

## 2022-09-22 ENCOUNTER — Emergency Department (HOSPITAL_COMMUNITY): Payer: 59

## 2022-09-22 ENCOUNTER — Inpatient Hospital Stay (HOSPITAL_COMMUNITY)
Admission: EM | Admit: 2022-09-22 | Discharge: 2022-09-25 | DRG: 100 | Disposition: A | Discharge status: Home / Self Care | Payer: 59 | Attending: Internal Medicine | Admitting: Internal Medicine

## 2022-09-22 ENCOUNTER — Encounter (HOSPITAL_COMMUNITY): Payer: Self-pay

## 2022-09-22 ENCOUNTER — Other Ambulatory Visit: Payer: Self-pay

## 2022-09-22 DIAGNOSIS — Z7189 Other specified counseling: Secondary | ICD-10-CM | POA: Diagnosis not present

## 2022-09-22 DIAGNOSIS — F0390 Unspecified dementia without behavioral disturbance: Secondary | ICD-10-CM | POA: Diagnosis present

## 2022-09-22 DIAGNOSIS — R4182 Altered mental status, unspecified: Secondary | ICD-10-CM

## 2022-09-22 DIAGNOSIS — G9349 Other encephalopathy: Secondary | ICD-10-CM | POA: Diagnosis present

## 2022-09-22 DIAGNOSIS — I69354 Hemiplegia and hemiparesis following cerebral infarction affecting left non-dominant side: Secondary | ICD-10-CM | POA: Diagnosis not present

## 2022-09-22 DIAGNOSIS — I6350 Cerebral infarction due to unspecified occlusion or stenosis of unspecified cerebral artery: Secondary | ICD-10-CM | POA: Diagnosis not present

## 2022-09-22 DIAGNOSIS — Z886 Allergy status to analgesic agent status: Secondary | ICD-10-CM | POA: Diagnosis not present

## 2022-09-22 DIAGNOSIS — G319 Degenerative disease of nervous system, unspecified: Secondary | ICD-10-CM | POA: Diagnosis not present

## 2022-09-22 DIAGNOSIS — I6502 Occlusion and stenosis of left vertebral artery: Secondary | ICD-10-CM | POA: Diagnosis present

## 2022-09-22 DIAGNOSIS — R414 Neurologic neglect syndrome: Secondary | ICD-10-CM | POA: Diagnosis present

## 2022-09-22 DIAGNOSIS — K5730 Diverticulosis of large intestine without perforation or abscess without bleeding: Secondary | ICD-10-CM | POA: Diagnosis not present

## 2022-09-22 DIAGNOSIS — Z79899 Other long term (current) drug therapy: Secondary | ICD-10-CM | POA: Diagnosis not present

## 2022-09-22 DIAGNOSIS — R918 Other nonspecific abnormal finding of lung field: Secondary | ICD-10-CM | POA: Diagnosis not present

## 2022-09-22 DIAGNOSIS — R531 Weakness: Secondary | ICD-10-CM | POA: Diagnosis not present

## 2022-09-22 DIAGNOSIS — F17210 Nicotine dependence, cigarettes, uncomplicated: Secondary | ICD-10-CM | POA: Diagnosis present

## 2022-09-22 DIAGNOSIS — R4701 Aphasia: Secondary | ICD-10-CM | POA: Diagnosis present

## 2022-09-22 DIAGNOSIS — I639 Cerebral infarction, unspecified: Secondary | ICD-10-CM | POA: Diagnosis not present

## 2022-09-22 DIAGNOSIS — E538 Deficiency of other specified B group vitamins: Secondary | ICD-10-CM | POA: Diagnosis present

## 2022-09-22 DIAGNOSIS — J449 Chronic obstructive pulmonary disease, unspecified: Secondary | ICD-10-CM | POA: Diagnosis present

## 2022-09-22 DIAGNOSIS — C3490 Malignant neoplasm of unspecified part of unspecified bronchus or lung: Secondary | ICD-10-CM | POA: Diagnosis not present

## 2022-09-22 DIAGNOSIS — J9621 Acute and chronic respiratory failure with hypoxia: Secondary | ICD-10-CM | POA: Diagnosis present

## 2022-09-22 DIAGNOSIS — Z794 Long term (current) use of insulin: Secondary | ICD-10-CM | POA: Diagnosis not present

## 2022-09-22 DIAGNOSIS — R29810 Facial weakness: Secondary | ICD-10-CM | POA: Diagnosis not present

## 2022-09-22 DIAGNOSIS — R590 Localized enlarged lymph nodes: Secondary | ICD-10-CM | POA: Diagnosis not present

## 2022-09-22 DIAGNOSIS — G40109 Localization-related (focal) (partial) symptomatic epilepsy and epileptic syndromes with simple partial seizures, not intractable, without status epilepticus: Principal | ICD-10-CM | POA: Diagnosis present

## 2022-09-22 DIAGNOSIS — E1165 Type 2 diabetes mellitus with hyperglycemia: Secondary | ICD-10-CM | POA: Diagnosis not present

## 2022-09-22 DIAGNOSIS — D509 Iron deficiency anemia, unspecified: Secondary | ICD-10-CM | POA: Diagnosis not present

## 2022-09-22 DIAGNOSIS — I739 Peripheral vascular disease, unspecified: Secondary | ICD-10-CM | POA: Insufficient documentation

## 2022-09-22 DIAGNOSIS — E1151 Type 2 diabetes mellitus with diabetic peripheral angiopathy without gangrene: Secondary | ICD-10-CM | POA: Diagnosis present

## 2022-09-22 DIAGNOSIS — R410 Disorientation, unspecified: Secondary | ICD-10-CM | POA: Diagnosis present

## 2022-09-22 DIAGNOSIS — Z515 Encounter for palliative care: Secondary | ICD-10-CM | POA: Diagnosis not present

## 2022-09-22 DIAGNOSIS — Z7902 Long term (current) use of antithrombotics/antiplatelets: Secondary | ICD-10-CM | POA: Diagnosis not present

## 2022-09-22 DIAGNOSIS — N281 Cyst of kidney, acquired: Secondary | ICD-10-CM | POA: Diagnosis not present

## 2022-09-22 DIAGNOSIS — Z8616 Personal history of COVID-19: Secondary | ICD-10-CM

## 2022-09-22 DIAGNOSIS — J439 Emphysema, unspecified: Secondary | ICD-10-CM | POA: Diagnosis not present

## 2022-09-22 DIAGNOSIS — E785 Hyperlipidemia, unspecified: Secondary | ICD-10-CM | POA: Diagnosis present

## 2022-09-22 DIAGNOSIS — R9431 Abnormal electrocardiogram [ECG] [EKG]: Secondary | ICD-10-CM | POA: Diagnosis not present

## 2022-09-22 DIAGNOSIS — I6523 Occlusion and stenosis of bilateral carotid arteries: Secondary | ICD-10-CM | POA: Diagnosis present

## 2022-09-22 DIAGNOSIS — R2990 Unspecified symptoms and signs involving the nervous system: Secondary | ICD-10-CM | POA: Diagnosis present

## 2022-09-22 DIAGNOSIS — I6389 Other cerebral infarction: Secondary | ICD-10-CM | POA: Diagnosis not present

## 2022-09-22 DIAGNOSIS — Z9981 Dependence on supplemental oxygen: Secondary | ICD-10-CM

## 2022-09-22 DIAGNOSIS — R6889 Other general symptoms and signs: Secondary | ICD-10-CM | POA: Diagnosis not present

## 2022-09-22 DIAGNOSIS — R29716 NIHSS score 16: Secondary | ICD-10-CM | POA: Diagnosis present

## 2022-09-22 DIAGNOSIS — I10 Essential (primary) hypertension: Secondary | ICD-10-CM | POA: Diagnosis present

## 2022-09-22 DIAGNOSIS — E1139 Type 2 diabetes mellitus with other diabetic ophthalmic complication: Secondary | ICD-10-CM | POA: Diagnosis not present

## 2022-09-22 DIAGNOSIS — Z833 Family history of diabetes mellitus: Secondary | ICD-10-CM

## 2022-09-22 DIAGNOSIS — R0902 Hypoxemia: Secondary | ICD-10-CM

## 2022-09-22 DIAGNOSIS — Z743 Need for continuous supervision: Secondary | ICD-10-CM | POA: Diagnosis not present

## 2022-09-22 DIAGNOSIS — G459 Transient cerebral ischemic attack, unspecified: Secondary | ICD-10-CM | POA: Diagnosis not present

## 2022-09-22 DIAGNOSIS — R29818 Other symptoms and signs involving the nervous system: Secondary | ICD-10-CM | POA: Diagnosis not present

## 2022-09-22 DIAGNOSIS — E119 Type 2 diabetes mellitus without complications: Secondary | ICD-10-CM

## 2022-09-22 DIAGNOSIS — R0602 Shortness of breath: Secondary | ICD-10-CM | POA: Diagnosis not present

## 2022-09-22 DIAGNOSIS — R404 Transient alteration of awareness: Secondary | ICD-10-CM | POA: Diagnosis not present

## 2022-09-22 HISTORY — DX: Cerebral infarction, unspecified: I63.9

## 2022-09-22 HISTORY — DX: Unspecified dementia, unspecified severity, without behavioral disturbance, psychotic disturbance, mood disturbance, and anxiety: F03.90

## 2022-09-22 LAB — DIFFERENTIAL
Abs Immature Granulocytes: 0.02 10*3/uL (ref 0.00–0.07)
Basophils Absolute: 0 10*3/uL (ref 0.0–0.1)
Basophils Relative: 1 %
Eosinophils Absolute: 0.3 10*3/uL (ref 0.0–0.5)
Eosinophils Relative: 4 %
Immature Granulocytes: 0 %
Lymphocytes Relative: 30 %
Lymphs Abs: 2.3 10*3/uL (ref 0.7–4.0)
Monocytes Absolute: 0.5 10*3/uL (ref 0.1–1.0)
Monocytes Relative: 7 %
Neutro Abs: 4.5 10*3/uL (ref 1.7–7.7)
Neutrophils Relative %: 58 %

## 2022-09-22 LAB — I-STAT CHEM 8, ED
BUN: 23 mg/dL (ref 8–23)
Calcium, Ion: 1.17 mmol/L (ref 1.15–1.40)
Chloride: 106 mmol/L (ref 98–111)
Creatinine, Ser: 0.8 mg/dL (ref 0.44–1.00)
Glucose, Bld: 132 mg/dL — ABNORMAL HIGH (ref 70–99)
HCT: 34 % — ABNORMAL LOW (ref 36.0–46.0)
Hemoglobin: 11.6 g/dL — ABNORMAL LOW (ref 12.0–15.0)
Potassium: 4.1 mmol/L (ref 3.5–5.1)
Sodium: 143 mmol/L (ref 135–145)
TCO2: 24 mmol/L (ref 22–32)

## 2022-09-22 LAB — COMPREHENSIVE METABOLIC PANEL
ALT: 20 U/L (ref 0–44)
AST: 18 U/L (ref 15–41)
Albumin: 2.7 g/dL — ABNORMAL LOW (ref 3.5–5.0)
Alkaline Phosphatase: 87 U/L (ref 38–126)
Anion gap: 11 (ref 5–15)
BUN: 22 mg/dL (ref 8–23)
CO2: 22 mmol/L (ref 22–32)
Calcium: 8.7 mg/dL — ABNORMAL LOW (ref 8.9–10.3)
Chloride: 107 mmol/L (ref 98–111)
Creatinine, Ser: 0.89 mg/dL (ref 0.44–1.00)
GFR, Estimated: >60 mL/min (ref >60)
Glucose, Bld: 133 mg/dL — ABNORMAL HIGH (ref 70–99)
Potassium: 4.1 mmol/L (ref 3.5–5.1)
Sodium: 140 mmol/L (ref 135–145)
Total Bilirubin: 0.4 mg/dL (ref 0.3–1.2)
Total Protein: 7.7 g/dL (ref 6.5–8.1)

## 2022-09-22 LAB — PROTIME-INR
INR: 1.1 (ref 0.8–1.2)
Prothrombin Time: 14.4 seconds (ref 11.4–15.2)

## 2022-09-22 LAB — CBC
HCT: 32.7 % — ABNORMAL LOW (ref 36.0–46.0)
Hemoglobin: 10.2 g/dL — ABNORMAL LOW (ref 12.0–15.0)
MCH: 22.6 pg — ABNORMAL LOW (ref 26.0–34.0)
MCHC: 31.2 g/dL (ref 30.0–36.0)
MCV: 72.3 fL — ABNORMAL LOW (ref 80.0–100.0)
Platelets: 294 10*3/uL (ref 150–400)
RBC: 4.52 MIL/uL (ref 3.87–5.11)
RDW: 21 % — ABNORMAL HIGH (ref 11.5–15.5)
WBC: 7.7 10*3/uL (ref 4.0–10.5)
nRBC: 0 % (ref 0.0–0.2)

## 2022-09-22 LAB — CBG MONITORING, ED
Glucose-Capillary: 127 mg/dL — ABNORMAL HIGH (ref 70–99)
Glucose-Capillary: 126 mg/dL — ABNORMAL HIGH (ref 70–99)
Glucose-Capillary: 111 mg/dL — ABNORMAL HIGH (ref 70–99)

## 2022-09-22 LAB — APTT: aPTT: 29 seconds (ref 24–36)

## 2022-09-22 LAB — ETHANOL: Alcohol, Ethyl (B): <10 mg/dL (ref <10)

## 2022-09-22 MED ORDER — STROKE: EARLY STAGES OF RECOVERY BOOK
Freq: Once | Status: AC
  Filled 2022-09-22: qty 1

## 2022-09-22 MED ORDER — CLOPIDOGREL BISULFATE 75 MG PO TABS
75.0000 mg | ORAL_TABLET | Freq: Every day | ORAL | Status: DC
  Administered 2022-09-22 – 2022-09-25 (×4): 75 mg via ORAL
  Filled 2022-09-22 (×4): qty 1

## 2022-09-22 MED ORDER — ACETAMINOPHEN 325 MG PO TABS
650.0000 mg | ORAL_TABLET | Freq: Four times a day (QID) | ORAL | Status: DC | PRN
  Administered 2022-09-24 (×2): 650 mg via ORAL
  Filled 2022-09-22 (×2): qty 2

## 2022-09-22 MED ORDER — IOHEXOL 350 MG/ML SOLN
75.0000 mL | Freq: Once | INTRAVENOUS | Status: AC | PRN
  Administered 2022-09-22: 75 mL via INTRAVENOUS

## 2022-09-22 MED ORDER — ACETAMINOPHEN 650 MG RE SUPP: 650.0000 mg | Freq: Four times a day (QID) | RECTAL | Status: DC | PRN

## 2022-09-22 MED ORDER — SODIUM CHLORIDE 0.9% FLUSH: 3.0000 mL | Freq: Once | INTRAVENOUS | Status: DC

## 2022-09-22 MED ORDER — IPRATROPIUM-ALBUTEROL 0.5-2.5 (3) MG/3ML IN SOLN: 3.0000 mL | Freq: Four times a day (QID) | RESPIRATORY_TRACT | Status: DC | PRN

## 2022-09-22 MED ORDER — NALOXONE HCL 2 MG/2ML IJ SOSY
1.0000 mg | PREFILLED_SYRINGE | Freq: Once | INTRAMUSCULAR | Status: AC
  Administered 2022-09-22: 1 mg via INTRAVENOUS

## 2022-09-22 MED ORDER — INSULIN ASPART 100 UNIT/ML IJ SOLN
0.0000 [IU] | INTRAMUSCULAR | Status: DC
  Administered 2022-09-22: 1 [IU] via SUBCUTANEOUS
  Administered 2022-09-23: 2 [IU] via SUBCUTANEOUS

## 2022-09-22 MED ORDER — ENOXAPARIN SODIUM 40 MG/0.4ML IJ SOSY
40.0000 mg | PREFILLED_SYRINGE | INTRAMUSCULAR | Status: DC
  Administered 2022-09-22 – 2022-09-24 (×3): 40 mg via SUBCUTANEOUS
  Filled 2022-09-22 (×3): qty 0.4

## 2022-09-22 NOTE — ED Provider Notes (Signed)
[facility] Provider Note   CSN: [id number] Arrival date & time: 09/22/22  [id number]  An emergency department physician performed an initial assessment on this suspected stroke patient at 1750.  History  Chief Complaint  Patient presents with   Code Stroke    Crystal Moore is a 77 y.o. female.  77 year old female presents acute onset of confusion and change in speech which began when she woke from her nap.  History of CVA in the past with resultant left-sided deficits.  Reportedly was at her baseline prior to taking a nap.  Yolanda Bonine is at bedside who states that normally patient can say her name is not really conversant.  She does have history dementia states that she is different from her baseline.  Called EMS and was transported here       Home Medications Prior to Admission medications   Medication Sig Start Date End Date Taking? Authorizing Provider  ACCU-CHEK GUIDE test strip USE TO test AS DIRECTED TWICE DAILY 05/23/21   Renato Shin, MD  acetaminophen (TYLENOL) 500 MG tablet Take 1,000 mg by mouth every 6 (six) hours as needed (pain).    [provider]  albuterol (PROVENTIL) (2.5 MG/3ML) 0.083% nebulizer solution Inhale 3 mLs (2.5 mg total) into the lungs every 4 (four) hours as needed for wheezing or shortness of breath. 01/17/19   Eulogio Bear U, DO  albuterol (VENTOLIN HFA) 108 (90 Base) MCG/ACT inhaler Inhale 1 puff into the lungs every 6 (six) hours as needed for wheezing or shortness of breath.    [provider]  amLODipine (NORVASC) 5 MG tablet Take 5 mg by mouth daily. 05/30/21   [provider]  atorvastatin (LIPITOR) 80 MG tablet Take 1 tablet (80 mg total) by mouth daily at 6 PM. Patient taking differently: Take 80 mg by mouth every evening. 12/21/18   Cristal Ford, DO  clopidogrel (PLAVIX) 75 MG tablet Take 1 tablet (75 mg total) by mouth daily. 09/24/20   Cherre Robins, MD  feeding  supplement (ENSURE ENLIVE / ENSURE PLUS) LIQD Take 237 mLs by mouth 2 (two) times daily between meals. 06/28/20   Renato Shin, MD  gabapentin (NEURONTIN) 300 MG capsule Take 1 capsule (300 mg total) by mouth 3 (three) times daily. 09/06/20   Hosie Poisson, MD  insulin glargine (LANTUS) 100 UNIT/ML injection Inject 0.15 mLs (15 Units total) into the skin daily. Patient taking differently: Inject 25 Units into the skin daily. 09/18/20   Terrilee Croak, MD  linagliptin (TRADJENTA) 5 MG TABS tablet Take 5 mg by mouth daily.    [provider]  pantoprazole (PROTONIX) 40 MG tablet Take 1 tablet (40 mg total) by mouth 2 (two) times daily. 09/18/20 09/06/21  Terrilee Croak, MD      Allergies    Aspirin    Review of Systems   Review of Systems  Unable to perform ROS: Other    Physical Exam Updated Vital Signs BP (!) 157/65   Pulse 77   Temp 98.7 F (37.1 C) (Oral)   Resp 13   Ht 1.575 m ('5\' 2"'$ )   Wt 67 kg   SpO2 91%   BMI 27.02 kg/m  Physical Exam Vitals and nursing note reviewed.  Constitutional:      General: She is not in acute distress.    Appearance: She is cachectic. She is not toxic-appearing.  HENT:     Head: Normocephalic and atraumatic.  Eyes:  General: Lids are normal.     Conjunctiva/sclera: Conjunctivae normal.     Pupils: Pupils are equal, round, and reactive to light.  Neck:     Thyroid: No thyroid mass.     Trachea: No tracheal deviation.  Cardiovascular:     Rate and Rhythm: Normal rate and regular rhythm.     Heart sounds: Normal heart sounds. No murmur heard.    No gallop.  Pulmonary:     Effort: Pulmonary effort is normal. No respiratory distress.     Breath sounds: Normal breath sounds. No stridor. No decreased breath sounds, wheezing, rhonchi or rales.  Abdominal:     General: There is no distension.     Palpations: Abdomen is soft.     Tenderness: There is no abdominal tenderness. There is no rebound.  Musculoskeletal:        General: No  tenderness. Normal range of motion.     Cervical back: Normal range of motion and neck supple.  Skin:    General: Skin is warm and dry.     Findings: No abrasion or rash.  Neurological:     Mental Status: She is oriented to person, place, and time. She is lethargic.     GCS: GCS eye subscore is 4. GCS verbal subscore is 5. GCS motor subscore is 6.     Cranial Nerves: No cranial nerve deficit.     Sensory: No sensory deficit.     Motor: Weakness and atrophy present.     Comments: Left-sided facial droop noted.  Strength is 0 out of 5 in left upper and left lower extremity.  Right upper extremity strength is 3 of 5.  Withdraws to pain in right lower extremity.  Psychiatric:        Attention and Perception: She is inattentive.        Speech: Speech is delayed.     ED Results / Procedures / Treatments   Labs (all labs ordered are listed, but only abnormal results are displayed) Labs Reviewed  CBC - Abnormal; Notable for the following components:      Result Value   Hemoglobin 10.2 (*)    HCT 32.7 (*)    MCV 72.3 (*)    MCH 22.6 (*)    RDW 21.0 (*)    All other components within normal limits  I-STAT CHEM 8, ED - Abnormal; Notable for the following components:   Glucose, Bld 132 (*)    Hemoglobin 11.6 (*)    HCT 34.0 (*)    All other components within normal limits  CBG MONITORING, ED - Abnormal; Notable for the following components:   Glucose-Capillary 127 (*)    All other components within normal limits  PROTIME-INR  APTT  DIFFERENTIAL  COMPREHENSIVE METABOLIC PANEL  ETHANOL    EKG EKG Interpretation  Date/Time:  Tuesday September 22 2022 18:18:36 EST Ventricular Rate:  74 PR Interval:  155 QRS Duration: 101 QT Interval:  368 QTC Calculation: 409 R Axis:   73 Text Interpretation: Sinus rhythm Atrial premature complex Nonspecific T abnormalities, lateral leads Confirmed by Lacretia Leigh ([id number]) on 09/22/2022 6:33:31 PM  Radiology CT HEAD CODE STROKE WO  CONTRAST  Result Date: 09/22/2022 CLINICAL DATA:  Code stroke. Neuro deficit, acute, stroke suspected. Weakness. Unresponsive. EXAM: CT HEAD WITHOUT CONTRAST TECHNIQUE: Contiguous axial images were obtained from the base of the skull through the vertex without intravenous contrast. RADIATION DOSE REDUCTION: This exam was performed according to the departmental dose-optimization program which includes automated  exposure control, adjustment of the mA and/or kV according to patient size and/or use of iterative reconstruction technique. COMPARISON:  Head CT and MRI 06/27/2021 FINDINGS: Brain: There is no evidence of an acute large territory infarct, intracranial hemorrhage, mass, midline shift, or extra-axial fluid collection. A background of advanced cerebral atrophy and chronic small vessel ischemia is again noted with unchanged ventriculomegaly. There is a chronic right ACA infarct with asymmetric ex vacuo dilatation of the right lateral ventricle. Chronic lacunar infarcts are again noted in the deep cerebral white matter bilaterally, basal ganglia, thalami, right cerebral peduncle, and both cerebellar hemispheres. Vascular: Calcified atherosclerosis at the skull base. No hyperdense vessel. Skull: No acute fracture or suspicious osseous lesion. Sinuses/Orbits: Paranasal sinuses and mastoid air cells are clear. Bilateral cataract extraction. Other: None. ASPECTS [facility] Stroke Program Early CT Score) Not scored given extensive chronic ischemia and no localizing findings in the provided clinical history. IMPRESSION: 1. No evidence of acute intracranial abnormality. 2. Advanced cerebral atrophy and chronic ischemia. These results were communicated to Dr. Quinn Axe at 6:07 pm on 09/22/2022 by text page via the [facility] messaging system. Electronically Signed   By: Logan Bores M.D.   On: 09/22/2022 18:07    Procedures Procedures    Medications Ordered in ED Medications  sodium chloride flush (NS) 0.9 % injection 3 mL  (has no administration in time range)  iohexol (OMNIPAQUE) 350 MG/ML injection 75 mL (75 mLs Intravenous Contrast Given 09/22/22 1810)    ED Course/ Medical Decision Making/ A&P                             Medical Decision Making Amount and/or Complexity of Data Reviewed Labs: ordered. Radiology: ordered.   Patient is EKG shows normal sinus rhythm.  Patient was code stroke prior to arrival.  Seen by Dr. Quinn Axe from neurology.  May.  Patient's head CT the inside per my review interpretation shows no signs of acute intracranial abnormality.  Patient's chest x-ray per interpretation shows lung mass.  Patient will need evaluation for this.  Likely with new CVA and patient will require admission.  Will consult hospitalist team       Final Clinical Impression(s) / ED Diagnoses Final diagnoses:  None    Rx / DC Orders ED Discharge Orders     None         Lacretia Leigh, MD 09/22/22 [id number]

## 2022-09-22 NOTE — Consult Note (Signed)
NEUROLOGY CONSULTATION NOTE   Date of service: September 22, 2022 Patient Name: Crystal Moore MRN:  [id number] DOB:  23-Jul-1946 Reason for consult: stroke code Requesting physician: Dr. Lacretia Leigh _ _ _   _ __   _ __ _ _  __ __   _ __   __ _  History of Present Illness   This is a 77 year old woman with multiple prior strokes with residual left-sided weakness, COPD, diabetes, seizures, tobacco use disorder, dementia who was brought in by family for not acting like herself and having difficulty communicating.  Last known well was 1315 when she went to take a nap.  When she woke up she had trouble finding her words and was slurring her speech which is abnormal for her.  At baseline she is a premodified Rankin of 4 and needs assistance with ADLs although is typically able to converse although she may not be oriented.  NIH stroke scale was 16.  CT head showed no acute process.  TNK was not administered due to presentation outside the window.  CTA showed no LVO.  The aspects of the lungs captured in the CTA head and neck showed finding concerning for malignancy and dedicated scan was ordered.   ROS   UTA 2/2 mental status  Past History   I have reviewed the following:  Past Medical History:  Diagnosis Date   Arthritis    COPD (chronic obstructive pulmonary disease) ([place])    COVID-19 virus infection 07/2020   CVA (cerebral vascular accident) ([place])    Diabetes mellitus without complication ([place])    Hemiparesis affecting left side as late effect of cerebrovascular accident (CVA) ([place])    Pyelonephritis    Seizures ([place])    Stroke ([place])    Tobacco use disorder    UTI (urinary tract infection)    Past Surgical History:  Procedure Laterality Date   ABDOMINAL AORTOGRAM W/LOWER EXTREMITY N/A 08/30/2020   Procedure: ABDOMINAL AORTOGRAM W/LOWER EXTREMITY;  Surgeon: Cherre Robins, MD;  Location: [place] [facility];  Service: Cardiovascular;  Laterality: N/A;   ABDOMINAL AORTOGRAM W/LOWER  EXTREMITY N/A 09/04/2020   Procedure: ABDOMINAL AORTOGRAM W/LOWER EXTREMITY;  Surgeon: Cherre Robins, MD;  Location: [place] [facility];  Service: Cardiovascular;  Laterality: N/A;   ESOPHAGOGASTRODUODENOSCOPY (EGD) WITH PROPOFOL N/A 09/16/2020   Procedure: ESOPHAGOGASTRODUODENOSCOPY (EGD) WITH PROPOFOL;  Surgeon: Lavena Bullion, DO;  Location: [facility];  Service: Gastroenterology;  Laterality: N/A;   HEMOSTASIS CLIP PLACEMENT  09/16/2020   Procedure: HEMOSTASIS CLIP PLACEMENT;  Surgeon: Lavena Bullion, DO;  Location: [facility];  Service: Gastroenterology;;   HOT HEMOSTASIS N/A 09/16/2020   Procedure: HOT HEMOSTASIS (ARGON PLASMA COAGULATION/BICAP);  Surgeon: Lavena Bullion, DO;  Location: [facility]OS[facility];  Service: Gastroenterology;  Laterality: N/A;   PERIPHERAL VASCULAR INTERVENTION Right 08/30/2020   Procedure: PERIPHERAL VASCULAR INTERVENTION;  Surgeon: Cherre Robins, MD;  Location: [place] [facility];  Service: Cardiovascular;  Laterality: Right;  femoral popliteal   PERIPHERAL VASCULAR INTERVENTION Left 09/04/2020   Procedure: PERIPHERAL VASCULAR INTERVENTION;  Surgeon: Cherre Robins, MD;  Location: [place] [facility];  Service: Cardiovascular;  Laterality: Left;  SFA   TUBAL LIGATION     Family History  Problem Relation Age of Onset   Diabetes Mother    Diabetes Sister    Diabetes Brother    Social History   Socioeconomic History   Marital status: Single    Spouse name: Not on file   Number of  children: Not on file   Years of education: Not on file   Highest education level: Not on file  Occupational History   Not on file  Tobacco Use   Smoking status: Every Day    Packs/day: 1.00    Years: 40.00    Total pack years: 40.00    Types: Cigarettes   Smokeless tobacco: Never  Vaping Use   Vaping Use: Never used  Substance and Sexual Activity   Alcohol use: Never   Drug use: Never   Sexual activity: Not on file  Other Topics Concern   Not on file  Social  History Narrative   ** Merged History Encounter **   Right handed    Lives with family        Social Determinants of Health   Financial Resource Strain: Not on file  Food Insecurity: No Food Insecurity (04/29/2022)   Hunger Vital Sign    Worried About Running Out of Food in the Last Year: Never true    Ran Out of Food in the Last Year: Never true  Transportation Needs: No Transportation Needs (04/29/2022)   PRAPARE - Hydrologist (Medical): No    Lack of Transportation (Non-Medical): No  Physical Activity: Not on file  Stress: Not on file  Social Connections: Not on file   Allergies  Allergen Reactions   Aspirin     Can not take due to previous GI bleed - per daughter    Medications   (Not in a hospital admission)     Current Facility-Administered Medications:    sodium chloride flush (NS) 0.9 % injection 3 mL, 3 mL, Intravenous, Once, Lacretia Leigh, MD  Current Outpatient Medications:    ACCU-CHEK GUIDE test strip, USE TO test AS DIRECTED TWICE DAILY, Disp: 200 strip, Rfl: 2   acetaminophen (TYLENOL) 500 MG tablet, Take 1,000 mg by mouth every 6 (six) hours as needed (pain)., Disp: , Rfl:    albuterol (PROVENTIL) (2.5 MG/3ML) 0.083% nebulizer solution, Inhale 3 mLs (2.5 mg total) into the lungs every 4 (four) hours as needed for wheezing or shortness of breath., Disp: 75 mL, Rfl: 12   albuterol (VENTOLIN HFA) 108 (90 Base) MCG/ACT inhaler, Inhale 1 puff into the lungs every 6 (six) hours as needed for wheezing or shortness of breath., Disp: , Rfl:    amLODipine (NORVASC) 5 MG tablet, Take 5 mg by mouth daily., Disp: , Rfl:    atorvastatin (LIPITOR) 80 MG tablet, Take 1 tablet (80 mg total) by mouth daily at 6 PM. (Patient taking differently: Take 80 mg by mouth every evening.), Disp: 30 tablet, Rfl: 0   clopidogrel (PLAVIX) 75 MG tablet, Take 1 tablet (75 mg total) by mouth daily., Disp: 30 tablet, Rfl: 11   feeding supplement (ENSURE ENLIVE /  ENSURE PLUS) LIQD, Take 237 mLs by mouth 2 (two) times daily between meals., Disp: 237 mL, Rfl: 12   gabapentin (NEURONTIN) 300 MG capsule, Take 1 capsule (300 mg total) by mouth 3 (three) times daily., Disp: 30 capsule, Rfl: 3   insulin glargine (LANTUS) 100 UNIT/ML injection, Inject 0.15 mLs (15 Units total) into the skin daily. (Patient taking differently: Inject 25 Units into the skin daily.), Disp: 10 mL, Rfl: 11   linagliptin (TRADJENTA) 5 MG TABS tablet, Take 5 mg by mouth daily., Disp: , Rfl:    pantoprazole (PROTONIX) 40 MG tablet, Take 1 tablet (40 mg total) by mouth 2 (two) times daily., Disp: 84 tablet,  Rfl: 0  Vitals   Vitals:   09/22/22 1700 09/22/22 1817 09/22/22 1818 09/22/22 1819  BP:  (!) 157/65    Pulse:  72 77   Resp:  14 13   Temp:    98.7 F (37.1 C)  TempSrc:    Oral  SpO2:  (!) 86% 91%   Weight: 67 kg     Height:   '5\' 2"'$  (1.575 m)      Body mass index is 27.02 kg/m.  Physical Exam   Physical Exam Gen: alert, oriented to self only, follows some simple commands HEENT: Atraumatic, normocephalic;mucous membranes moist; oropharynx clear, tongue without atrophy or fasciculations. Neck: Supple, trachea midline. Resp: CTAB, no w/r/r CV: RRR, no m/g/r; nml S1 and S2. 2+ symmetric peripheral pulses. Abd: soft/NT/ND; nabs x 4 quad Extrem: Nml bulk; no cyanosis, clubbing, or edema.  Neuro: *MS: alert, oriented to self only, follows some simple commands *Speech: no dysarthria, mild aphasia *CN:    I: Deferred   II,III: PERRLA, blinks to threat bilat, optic discs unable to be visualized 2/2 pupillary constriction   III,IV,VI: EOMI w/o nystagmus, no ptosis   V: Sensation intact from V1 to V3 to LT   VII: Eyelid closure was full.  L NLF flattening   VIII: Hearing intact to voice   IX,X: Voice normal, palate elevates symmetrically    XI: SCM/trap 5/5 bilat   XII: Tongue protrudes midline, no atrophy or fasciculations   *Motor:   Normal bulk.  No tremor,  rigidity or bradykinesia. No movement against gravity LUE or LLE. RUE no drift, RLE drift but not to bed.  *Sensory: Severe impairment L side to LT. Visual and sensory neglect on L.  *Coordination:  UTA *Reflexes:  1+ and symmetric throughout without clonus; toes down-going bilat *Gait: deferred  NIHSS  1a Level of Conscious.: 0 1b LOC Questions: 2 1c LOC Commands: 1 2 Best Gaze: 0 3 Visual: 0 4 Facial Palsy: 1 5a Motor Arm - left: 3 5b Motor Arm - Right: 0 6a Motor Leg - Left: 3 6b Motor Leg - Right: 1 7 Limb Ataxia: 0 8 Sensory: 2 9 Best Language: 1 10 Dysarthria: 0 11 Extinct. and Inatten.: 2  TOTAL: 16   Premorbid mRS = 4   Labs   CBC:  Recent Labs  Lab 09/22/22 1750 09/22/22 1755  WBC 7.7  --   NEUTROABS 4.5  --   HGB 10.2* 11.6*  HCT 32.7* 34.0*  MCV 72.3*  --   PLT 294  --     Basic Metabolic Panel:  Lab Results  Component Value Date   NA 143 09/22/2022   K 4.1 09/22/2022   CO2 22 09/22/2022   GLUCOSE 132 (H) 09/22/2022   BUN 23 09/22/2022   CREATININE 0.80 09/22/2022   CALCIUM 8.7 (L) 09/22/2022   GFRNONAA >60 09/22/2022   GFRAA >60 01/16/2019   Lipid Panel:  Lab Results  Component Value Date   LDLCALC 46 06/28/2021   HgbA1c:  Lab Results  Component Value Date   HGBA1C 8.8 (H) 06/28/2021   Urine Drug Screen:     Component Value Date/Time   LABOPIA NONE DETECTED 06/27/2021 0948   COCAINSCRNUR NONE DETECTED 06/27/2021 0948   LABBENZ NONE DETECTED 06/27/2021 0948   AMPHETMU NONE DETECTED 06/27/2021 0948   THCU NONE DETECTED 06/27/2021 0948   LABBARB NONE DETECTED 06/27/2021 0948    Alcohol Level     Component Value Date/Time   ETH <10 09/22/2022 1750  CT head 1. No evidence of acute intracranial abnormality. 2. Advanced cerebral atrophy and chronic ischemia.  CTA H&N 1. No emergent large vessel occlusion. 2. Advanced atherosclerosis in the head and neck as detailed above including 70-75% stenosis of the proximal right  ICA and 75+% stenosis of the proximal left ICA. 3. Chronic occlusion of the left vertebral artery at its origin with thready distal reconstitution. 4. Partially visualized large left upper lobe lung mass, 3 cm right upper lobe mass, and hilar and mediastinal lymphadenopathy concerning for lung cancer. Chest CT is recommended for further evaluation. 5. Aortic Atherosclerosis (ICD10-I70.0) and Emphysema (ICD10-J43.9).  Impression   This is a 77 year old woman with multiple prior strokes with residual left-sided weakness, COPD, diabetes, seizures, tobacco use disorder, dementia who was brought in by family for confusion and aphasia c/f TIA vs acute ischemic stroke.  Recommendations   - Permissive HTN x48 hrs from sx onset or until stroke ruled out by MRI goal BP <220/110. PRN labetalol or hydralazine if BP above these parameters. Avoid oral antihypertensives. - MRI brain wo contrast - TTE w/ bubble - Check A1c and LDL + add statin per guidelines - Plavix '75mg'$  daily (allergic to aspirin) - q4 hr neuro checks - STAT head CT for any change in neuro exam - Tele - PT/OT/SLP - Stroke education - Workup of possible lung malignancy per admitting team - UA - Amb referral to neurology upon discharge   ______________________________________________________________________   Thank you for the opportunity to take part in the care of this patient. If you have any further questions, please contact the neurology consultation attending.  Signed,  Su Monks, MD [facility] [phone]  If 7pm- 7am, please page neurology on call as listed in [place].  **Any copied and pasted documentation in this note was written by me in another application not billed for and pasted by me into this document.

## 2022-09-22 NOTE — Code Documentation (Signed)
Stroke Response Nurse Documentation Code Documentation  CIRA MILWARD is a 77 y.o. female arriving to [facility]  via [facility] on 09/22/2022 with past medical hx of diabetes, COPD, seizures, stroke, and UTI. On aspirin 81 mg daily and clopidogrel 75 mg daily. Unsure if taking meds as she has a documented aspirin allergy- can not take due to previous GI bleed. Code stroke was activated by EMS.   Patient from home where she was LKW at 1315 when she dozed off. Home nurse noted she was still sleeping during usual meal time. Attempted to wake her at 1615 and was unable to wake her up. Called family and then called EMS.   Stroke team at the bedside on patient arrival. Labs drawn and patient cleared for CT by EDP. Patient to CT with team. NIHSS 16, see documentation for details and code stroke times. Patient with disoriented, not following commands, left facial droop, left arm weakness, bilateral leg weakness, bilateral decreased sensation, Expressive aphasia , and Sensory  neglect on exam. Narcan given in CT. The following imaging was completed:  CT Head and CTA. Patient is not a candidate for IV Thrombolytic due to out of the window. Patient is not a candidate for IR due to no LVO.  Care Plan: Q2 VS and NIHSS.   Bedside handoff with ED RN Bland Span.    Leverne Humbles Stroke Response RN

## 2022-09-22 NOTE — Clinical Note (Incomplete)
NEUROLOGY CONSULTATION NOTE   Date of service: September 22, 2022 Patient Name: MAURIA SHONTZ MRN:  [id number] DOB:  [birth year] Reason for consult: "***" Requesting physician: "***" _ _ _   _ __   _ __ _ _  __ __   _ __   __ _  History of Present Illness   ELDEAN GOUGHNOUR is a 77 y.o. female with PMH significant for  has a past medical history of Arthritis, COPD (chronic obstructive pulmonary disease) ([place]), COVID-19 virus infection (07/2020), CVA (cerebral vascular accident) ([place]), Diabetes mellitus without complication ([place]), Hemiparesis affecting left side as late effect of cerebrovascular accident (CVA) ([place]), Pyelonephritis, Seizures ([place]), Stroke ([place]), Tobacco use disorder, and UTI (urinary tract infection). who presents with  ***   ROS   Per HPI: all other systems reviewed and are negative  Past History   I have reviewed the following:  Past Medical History:  Diagnosis Date   Arthritis    COPD (chronic obstructive pulmonary disease) ([place])    COVID-19 virus infection 07/2020   CVA (cerebral vascular accident) ([place])    Diabetes mellitus without complication ([place])    Hemiparesis affecting left side as late effect of cerebrovascular accident (CVA) ([place])    Pyelonephritis    Seizures ([place])    Stroke ([place])    Tobacco use disorder    UTI (urinary tract infection)    Past Surgical History:  Procedure Laterality Date   ABDOMINAL AORTOGRAM W/LOWER EXTREMITY N/A 08/30/2020   Procedure: ABDOMINAL AORTOGRAM W/LOWER EXTREMITY;  Surgeon: Cherre Robins, MD;  Location: [facility];  Service: Cardiovascular;  Laterality: N/A;   ABDOMINAL AORTOGRAM W/LOWER EXTREMITY N/A 09/04/2020   Procedure: ABDOMINAL AORTOGRAM W/LOWER EXTREMITY;  Surgeon: Cherre Robins, MD;  Location: [facility];  Service: Cardiovascular;  Laterality: N/A;   ESOPHAGOGASTRODUODENOSCOPY (EGD) WITH PROPOFOL N/A 09/16/2020   Procedure: ESOPHAGOGASTRODUODENOSCOPY (EGD) WITH PROPOFOL;  Surgeon: Lavena Bullion, DO;  Location: [facility];  Service: Gastroenterology;  Laterality: N/A;   HEMOSTASIS CLIP PLACEMENT  09/16/2020   Procedure: HEMOSTASIS CLIP PLACEMENT;  Surgeon: Lavena Bullion, DO;  Location: [facility];  Service: Gastroenterology;;   HOT HEMOSTASIS N/A 09/16/2020   Procedure: HOT HEMOSTASIS (ARGON PLASMA COAGULATION/BICAP);  Surgeon: Lavena Bullion, DO;  Location: [facility];  Service: Gastroenterology;  Laterality: N/A;   PERIPHERAL VASCULAR INTERVENTION Right 08/30/2020   Procedure: PERIPHERAL VASCULAR INTERVENTION;  Surgeon: Cherre Robins, MD;  Location: [facility];  Service: Cardiovascular;  Laterality: Right;  femoral popliteal   PERIPHERAL VASCULAR INTERVENTION Left 09/04/2020   Procedure: PERIPHERAL VASCULAR INTERVENTION;  Surgeon: Cherre Robins, MD;  Location: [facility];  Service: Cardiovascular;  Laterality: Left;  SFA   TUBAL LIGATION     Family History  Problem Relation Age of Onset   Diabetes Mother    Diabetes Sister    Diabetes Brother    Social History   Socioeconomic History   Marital status: Single    Spouse name: Not on file   Number of children: Not on file   Years of education: Not on file   Highest education level: Not on file  Occupational History   Not on file  Tobacco Use   Smoking status: Every Day    Packs/day: 1.00    Years: 40.00    Total pack years: 40.00    Types: Cigarettes   Smokeless tobacco: Never  Vaping Use   Vaping Use: Never used  Substance and  Sexual Activity   Alcohol use: Never   Drug use: Never   Sexual activity: Not on file  Other Topics Concern   Not on file  Social History Narrative   ** Merged History Encounter **   Right handed    Lives with family        Social Determinants of Health   Financial Resource Strain: Not on file  Food Insecurity: No Food Insecurity (04/29/2022)   Hunger Vital Sign    Worried About Running Out of Food in the Last Year: Never true    Ran Out of Food  in the Last Year: Never true  Transportation Needs: No Transportation Needs (04/29/2022)   PRAPARE - Hydrologist (Medical): No    Lack of Transportation (Non-Medical): No  Physical Activity: Not on file  Stress: Not on file  Social Connections: Not on file   Allergies  Allergen Reactions   Aspirin     Can not take due to previous GI bleed - per daughter    Medications   (Not in a hospital admission)     Current Facility-Administered Medications:    sodium chloride flush (NS) 0.9 % injection 3 mL, 3 mL, Intravenous, Once, Lacretia Leigh, MD  Current Outpatient Medications:    ACCU-CHEK GUIDE test strip, USE TO test AS DIRECTED TWICE DAILY, Disp: 200 strip, Rfl: 2   acetaminophen (TYLENOL) 500 MG tablet, Take 1,000 mg by mouth every 6 (six) hours as needed (pain)., Disp: , Rfl:    albuterol (PROVENTIL) (2.5 MG/3ML) 0.083% nebulizer solution, Inhale 3 mLs (2.5 mg total) into the lungs every 4 (four) hours as needed for wheezing or shortness of breath., Disp: 75 mL, Rfl: 12   albuterol (VENTOLIN HFA) 108 (90 Base) MCG/ACT inhaler, Inhale 1 puff into the lungs every 6 (six) hours as needed for wheezing or shortness of breath., Disp: , Rfl:    amLODipine (NORVASC) 5 MG tablet, Take 5 mg by mouth daily., Disp: , Rfl:    atorvastatin (LIPITOR) 80 MG tablet, Take 1 tablet (80 mg total) by mouth daily at 6 PM. (Patient taking differently: Take 80 mg by mouth every evening.), Disp: 30 tablet, Rfl: 0   clopidogrel (PLAVIX) 75 MG tablet, Take 1 tablet (75 mg total) by mouth daily., Disp: 30 tablet, Rfl: 11   feeding supplement (ENSURE ENLIVE / ENSURE PLUS) LIQD, Take 237 mLs by mouth 2 (two) times daily between meals., Disp: 237 mL, Rfl: 12   gabapentin (NEURONTIN) 300 MG capsule, Take 1 capsule (300 mg total) by mouth 3 (three) times daily., Disp: 30 capsule, Rfl: 3   insulin glargine (LANTUS) 100 UNIT/ML injection, Inject 0.15 mLs (15 Units total) into the skin  daily. (Patient taking differently: Inject 25 Units into the skin daily.), Disp: 10 mL, Rfl: 11   linagliptin (TRADJENTA) 5 MG TABS tablet, Take 5 mg by mouth daily., Disp: , Rfl:    pantoprazole (PROTONIX) 40 MG tablet, Take 1 tablet (40 mg total) by mouth 2 (two) times daily., Disp: 84 tablet, Rfl: 0  Vitals   There were no vitals filed for this visit.   There is no height or weight on file to calculate BMI.  Physical Exam   Physical Exam Gen: A&O x4, NAD HEENT: Atraumatic, normocephalic;mucous membranes moist; oropharynx clear, tongue without atrophy or fasciculations. Neck: Supple, trachea midline. Resp: CTAB, no w/r/r CV: RRR, no m/g/r; nml S1 and S2. 2+ symmetric peripheral pulses. Abd: soft/NT/ND; nabs x 4 quad  Extrem: Nml bulk; no cyanosis, clubbing, or edema.  Neuro: *MS: A&O x4. Follows multi-step commands.  *Speech: fluid, nondysarthric, able to name and repeat *CN:    I: Deferred   II,III: PERRLA, VFF by confrontation, optic discs unable to be visualized 2/2 pupillary constriction   III,IV,VI: EOMI w/o nystagmus, no ptosis   V: Sensation intact from V1 to V3 to LT   VII: Eyelid closure was full.  Smile symmetric.   VIII: Hearing intact to voice   IX,X: Voice normal, palate elevates symmetrically    XI: SCM/trap 5/5 bilat   XII: Tongue protrudes midline, no atrophy or fasciculations   *Motor:   Normal bulk.  No tremor, rigidity or bradykinesia. No pronator drift.    Strength: Dlt Bic Tri WrE WrF FgS Gr HF KnF KnE PlF DoF    Left '5 5 5 5 5 5 5 5 5 5 5 5    '$ Right '5 5 5 5 5 5 5 5 5 5 5 5    '$ *Sensory: Intact to light touch, pinprick, temperature vibration throughout. Symmetric. Propioception intact bilat.  No double-simultaneous extinction.  *Coordination:  Finger-to-nose, heel-to-shin, rapid alternating motions were intact. *Reflexes:  2+ and symmetric throughout without clonus; toes down-going bilat *Gait: normal base, normal stride, normal turn. Negative  [place]berg.  NIHSS  1a Level of Conscious.: *** 1b LOC Questions: *** 1c LOC Commands: *** 2 Best Gaze: *** 3 Visual: *** 4 Facial Palsy: *** 5a Motor Arm - left: *** 5b Motor Arm - Right: *** 6a Motor Leg - Left: *** 6b Motor Leg - Right: *** 7 Limb Ataxia: *** 8 Sensory: *** 9 Best Language: *** 10 Dysarthria: *** 11 Extinct. and Inatten.: ***  TOTAL: ***   Premorbid mRS = ***   Labs   CBC: No results for input(s): "WBC", "NEUTROABS", "HGB", "HCT", "MCV", "PLT" in the last 168 hours.  Basic Metabolic Panel:  Lab Results  Component Value Date   NA 136 06/29/2021   K 3.4 (L) 06/29/2021   CO2 26 06/29/2021   GLUCOSE 115 (H) 06/29/2021   BUN 17 06/29/2021   CREATININE 0.92 06/29/2021   CALCIUM 8.2 (L) 06/29/2021   GFRNONAA >60 06/29/2021   GFRAA >60 01/16/2019   Lipid Panel:  Lab Results  Component Value Date   LDLCALC 46 06/28/2021   HgbA1c:  Lab Results  Component Value Date   HGBA1C 8.8 (H) 06/28/2021   Urine Drug Screen:     Component Value Date/Time   LABOPIA NONE DETECTED 06/27/2021 0948   COCAINSCRNUR NONE DETECTED 06/27/2021 0948   LABBENZ NONE DETECTED 06/27/2021 0948   AMPHETMU NONE DETECTED 06/27/2021 0948   THCU NONE DETECTED 06/27/2021 0948   LABBARB NONE DETECTED 06/27/2021 0948    Alcohol Level     Component Value Date/Time   ETH <10 06/27/2021 1050    CT Head without contrast: ***  CT angio Head and Neck with contrast: ***  MRI Brain ***  rEEG: ***  Impression   ***  Recommendations  *** ______________________________________________________________________   Thank you for the opportunity to take part in the care of this patient. If you have any further questions, please contact the neurology consultation attending.  Signed,  Su Monks, MD [facility] [phone]  If 7pm- 7am, please page neurology on call as listed in [place].  **Any copied and pasted documentation in this note was written by  me in another application not billed for and pasted by me into this document.

## 2022-09-22 NOTE — H&P (Signed)
History and Physical    Crystal Moore [id number] DOB: [birth year] DOA: 09/22/2022  PCP: [name], MD  Patient coming from: Home  Chief Complaint: Code stroke  HPI: Crystal Moore is a 77 y.o. female with medical history significant of COPD, multiple prior strokes with residual left-sided weakness, dementia, insulin-dependent type 2 diabetes, seizures, PVD status post bilateral stenting presented to the ED via EMS as code stroke for not acting like herself and having difficulty communicating.  Last known well at 1315 when she went to take a nap.  When patient woke up she had trouble finding her words and was slurring her speech which is abnormal for her.  NIHSS 16 on arrival to the ED.  CT head showed no acute process.  TNK was not administered due to presentation outside the window.  CTA showed no LVO. Showing advanced atherosclerosis in the head and neck including 70 to 75% stenosis of the proximal right ICA and 75+ percent stenosis of the proximal left ICA.  Chronic occlusion of the left vertebral artery at its origin with thready distal reconstitution.  Partially visualized large left upper lobe lung mass, 3 cm right upper lobe mass, and hilar and mediastinal lymphadenopathy concerning for lung cancer. Oxygen saturation in the mid 80s and placed on 2 L supplemental oxygen.  Labs significant for hemoglobin 10.2 (stable), glucose 133, blood ethanol level undetectable.    Chest x-ray showing enlarging bilateral upper lobe pulmonary masses consistent with progressive malignancy.  CT chest with IV contrast recommended for further evaluation.  Patient received Narcan in the ED.  TRH called to admit.  Patient is not able to give much information due to confusion and slight expressive aphasia.  She denies headaches, fevers, cough, shortness of breath, chest pain, nausea, vomiting, abdominal pain, diarrhea, or any urinary symptoms.  History provided by son at bedside who states that the  patient lives at home and has a nurse who visits her every day.  States he was there with the patient until 2 PM today when she was in her usual state of health, talking.  Afterwards patient went to take a nap and when her nurse tried to wake her up around 4 or 5 PM, she could not wake up the patient and called EMS.  Patient noted to have left-sided facial droop which is new per son.  Son states patient is not able to move her left upper and lower extremity since after her prior stroke.  Review of Systems:  Review of Systems  All other systems reviewed and are negative.   Past Medical History:  Diagnosis Date   Arthritis    COPD (chronic obstructive pulmonary disease) ([place])    COVID-19 virus infection 07/2020   CVA (cerebral vascular accident) ([place])    Diabetes mellitus without complication ([place])    Hemiparesis affecting left side as late effect of cerebrovascular accident (CVA) ([place]rystal Moore)    Pyelonephritis    Seizures ([place])    Stroke ([place])    Tobacco use disorder    UTI (urinary tract infection)     Past Surgical History:  Procedure Laterality Date   ABDOMINAL AORTOGRAM W/LOWER EXTREMITY N/A 08/30/2020   Procedure: ABDOMINAL AORTOGRAM W/LOWER EXTREMITY;  Surgeon: Cherre Robins, MD;  Location: [place] [facility];  Service: Cardiovascular;  Laterality: N/A;   ABDOMINAL AORTOGRAM W/LOWER EXTREMITY N/A 09/04/2020   Procedure: ABDOMINAL AORTOGRAM W/LOWER EXTREMITY;  Surgeon: Cherre Robins, MD;  Location: [place] [facility];  Service: Cardiovascular;  Laterality:  N/A;   ESOPHAGOGASTRODUODENOSCOPY (EGD) WITH PROPOFOL N/A 09/16/2020   Procedure: ESOPHAGOGASTRODUODENOSCOPY (EGD) WITH PROPOFOL;  Surgeon: Lavena Bullion, DO;  Location: [facility];  Service: Gastroenterology;  Laterality: N/A;   HEMOSTASIS CLIP PLACEMENT  09/16/2020   Procedure: HEMOSTASIS CLIP PLACEMENT;  Surgeon: Lavena Bullion, DO;  Location: [place];  Service: Gastroenterology;;   HOT HEMOSTASIS N/A 09/16/2020    Procedure: HOT HEMOSTASIS (ARGON PLASMA COAGULATION/BICAP);  Surgeon: Lavena Bullion, DO;  Location: [facility];  Service: Gastroenterology;  Laterality: N/A;   PERIPHERAL VASCULAR INTERVENTION Right 08/30/2020   Procedure: PERIPHERAL VASCULAR INTERVENTION;  Surgeon: Cherre Robins, MD;  Location: [place] [facility];  Service: Cardiovascular;  Laterality: Right;  femoral popliteal   PERIPHERAL VASCULAR INTERVENTION Left 09/04/2020   Procedure: PERIPHERAL VASCULAR INTERVENTION;  Surgeon: Cherre Robins, MD;  Location: [place] [facility];  Service: Cardiovascular;  Laterality: Left;  SFA   TUBAL LIGATION       reports that she has been smoking cigarettes. She has a 40.00 pack-year smoking history. She has never used smokeless tobacco. She reports that she does not drink alcohol and does not use drugs.  Allergies  Allergen Reactions   Aspirin     Can not take due to previous GI bleed - per daughter    Family History  Problem Relation Age of Onset   Diabetes Mother    Diabetes Sister    Diabetes Brother     Prior to Admission medications   Medication Sig Start Date End Date Taking? Authorizing Provider  ACCU-CHEK GUIDE test strip USE TO test AS DIRECTED TWICE DAILY 05/23/21   Renato Shin, MD  acetaminophen (TYLENOL) 500 MG tablet Take 1,000 mg by mouth every 6 (six) hours as needed (pain).    [provider]  albuterol (PROVENTIL) (2.5 MG/3ML) 0.083% nebulizer solution Inhale 3 mLs (2.5 mg total) into the lungs every 4 (four) hours as needed for wheezing or shortness of breath. 01/17/19   Eulogio Bear U, DO  albuterol (VENTOLIN HFA) 108 (90 Base) MCG/ACT inhaler Inhale 1 puff into the lungs every 6 (six) hours as needed for wheezing or shortness of breath.    [provider]  amLODipine (NORVASC) 5 MG tablet Take 5 mg by mouth daily. 05/30/21   [provider]  atorvastatin (LIPITOR) 80 MG tablet Take 1 tablet (80 mg total) by mouth daily at 6 PM. Patient  taking differently: Take 80 mg by mouth every evening. 12/21/18   Cristal Ford, DO  clopidogrel (PLAVIX) 75 MG tablet Take 1 tablet (75 mg total) by mouth daily. 09/24/20   Cherre Robins, MD  feeding supplement (ENSURE ENLIVE / ENSURE PLUS) LIQD Take 237 mLs by mouth 2 (two) times daily between meals. 06/28/20   Renato Shin, MD  gabapentin (NEURONTIN) 300 MG capsule Take 1 capsule (300 mg total) by mouth 3 (three) times daily. 09/06/20   Hosie Poisson, MD  insulin glargine (LANTUS) 100 UNIT/ML injection Inject 0.15 mLs (15 Units total) into the skin daily. Patient taking differently: Inject 25 Units into the skin daily. 09/18/20   Terrilee Croak, MD  linagliptin (TRADJENTA) 5 MG TABS tablet Take 5 mg by mouth daily.    [provider]  pantoprazole (PROTONIX) 40 MG tablet Take 1 tablet (40 mg total) by mouth 2 (two) times daily. 09/18/20 09/06/21  Terrilee Croak, MD    Physical Exam: Vitals:   09/22/22 1817 09/22/22 1818 09/22/22 1819 09/22/22 1900  BP: (!) 157/65   Marland Kitchen)  163/73  Pulse: 72 77  72  Resp: '14 13  20  '$ Temp:   98.7 F (37.1 C)   TempSrc:   Oral   SpO2: (!) 86% 91%  100%  Weight:      Height:  '5\' 2"'$  (1.575 m)      Physical Exam Vitals reviewed.  Constitutional:      General: She is not in acute distress. HENT:     Head: Normocephalic and atraumatic.  Eyes:     Extraocular Movements: Extraocular movements intact.  Cardiovascular:     Rate and Rhythm: Normal rate and regular rhythm.     Pulses: Normal pulses.  Pulmonary:     Effort: Pulmonary effort is normal. No respiratory distress.     Breath sounds: No wheezing or rales.  Abdominal:     General: Bowel sounds are normal. There is no distension.     Palpations: Abdomen is soft.     Tenderness: There is no abdominal tenderness.  Musculoskeletal:     Cervical back: Normal range of motion.     Right lower leg: No edema.     Left lower leg: No edema.  Skin:    General: Skin is warm and dry.  Neurological:      Mental Status: She is alert.     Comments: Oriented to person and place but not time Confusion, slight expressive aphasia, left facial droop Left upper and lower extremity weakness which is chronic per son     Labs on Admission: I have personally reviewed following labs and imaging studies  CBC: Recent Labs  Lab 09/22/22 1750 09/22/22 1755  WBC 7.7  --   NEUTROABS 4.5  --   HGB 10.2* 11.6*  HCT 32.7* 34.0*  MCV 72.3*  --   PLT 294  --    Basic Metabolic Panel: Recent Labs  Lab 09/22/22 1750 09/22/22 1755  NA 140 143  K 4.1 4.1  CL 107 106  CO2 22  --   GLUCOSE 133* 132*  BUN 22 23  CREATININE 0.89 0.80  CALCIUM 8.7*  --    GFR: Estimated Creatinine Clearance: 54.6 mL/min (by C-G formula based on SCr of 0.8 mg/dL). Liver Function Tests: Recent Labs  Lab 09/22/22 1750  AST 18  ALT 20  ALKPHOS 87  BILITOT 0.4  PROT 7.7  ALBUMIN 2.7*   No results for input(s): "LIPASE", "AMYLASE" in the last 168 hours. No results for input(s): "AMMONIA" in the last 168 hours. Coagulation Profile: Recent Labs  Lab 09/22/22 1750  INR 1.1   Cardiac Enzymes: No results for input(s): "CKTOTAL", "CKMB", "CKMBINDEX", "TROPONINI" in the last 168 hours. BNP (last 3 results) No results for input(s): "PROBNP" in the last 8760 hours. HbA1C: No results for input(s): "HGBA1C" in the last 72 hours. CBG: Recent Labs  Lab 09/22/22 1751  GLUCAP 127*   Lipid Profile: No results for input(s): "CHOL", "HDL", "LDLCALC", "TRIG", "CHOLHDL", "LDLDIRECT" in the last 72 hours. Thyroid Function Tests: No results for input(s): "TSH", "T4TOTAL", "FREET4", "T3FREE", "THYROIDAB" in the last 72 hours. Anemia Panel: No results for input(s): "VITAMINB12", "FOLATE", "FERRITIN", "TIBC", "IRON", "RETICCTPCT" in the last 72 hours. Urine analysis:    Component Value Date/Time   COLORURINE STRAW (A) 01/30/2021 1908   APPEARANCEUR CLEAR 01/30/2021 1908   LABSPEC 1.024 01/30/2021 1908   PHURINE  6.0 01/30/2021 1908   GLUCOSEU >=500 (A) 01/30/2021 1908   HGBUR NEGATIVE 01/30/2021 1908   BILIRUBINUR NEGATIVE 01/30/2021 1908   KETONESUR NEGATIVE  01/30/2021 1908   PROTEINUR NEGATIVE 01/30/2021 1908   NITRITE NEGATIVE 01/30/2021 1908   LEUKOCYTESUR NEGATIVE 01/30/2021 1908    Radiological Exams on Admission: CT ANGIO HEAD NECK W WO CM (CODE STROKE)  Result Date: 09/22/2022 CLINICAL DATA:  Neuro deficit, acute, stroke suspected. Possible aphasia. Weakness. Unresponsive. EXAM: CT ANGIOGRAPHY HEAD AND NECK TECHNIQUE: Multidetector CT imaging of the head and neck was performed using the standard protocol during bolus administration of intravenous contrast. Multiplanar CT image reconstructions and MIPs were obtained to evaluate the vascular anatomy. Carotid stenosis measurements (when applicable) are obtained utilizing NASCET criteria, using the distal internal carotid diameter as the denominator. RADIATION DOSE REDUCTION: This exam was performed according to the departmental dose-optimization program which includes automated exposure control, adjustment of the mA and/or kV according to patient size and/or use of iterative reconstruction technique. CONTRAST:  64m OMNIPAQUE IOHEXOL 350 MG/ML SOLN COMPARISON:  Head and neck CTA 06/27/2021 FINDINGS: CTA NECK FINDINGS Aortic arch: Normal variant aortic arch branching pattern with common origin of the brachiocephalic and left common carotid arteries. Mild-to-moderate atherosclerosis without significant stenosis of the arch vessel origins. Right carotid system: Patent with extensive, predominantly calcified plaque at the carotid bifurcation resulting in 70-75% stenosis of the proximal ICA, similar to the prior study. Left carotid system: Patent with extensive calcified and soft plaque about the carotid bifurcation resulting in 75% or greater stenosis of the proximal ICA, similar to the prior study. Vertebral arteries: The right vertebral artery is patent with  unchanged mild stenosis of its origin due to calcified plaque. There is unchanged occlusion of the left vertebral artery at its origin with faint opacification of thready V2 and V3 segments. Skeleton: No suspicious osseous lesion. Other neck: No evidence of cervical lymphadenopathy or mass. Upper chest: New 3.1 x 2.0 cm right upper lobe mass. Partially visualized large mass anteriorly in the left upper lobe measuring at least 7 cm with suspected left and possibly right hilar lymphadenopathy. Abnormal pre-vascular lymph nodes measuring up to 1.1 cm in short axis. Underlying emphysema. Review of the MIP images confirms the above findings CTA HEAD FINDINGS Anterior circulation: The internal carotid arteries are patent from skull base to carotid termini with atherosclerotic calcification resulting in moderate right and severe left cavernous stenoses, similar to prior. ACAs and MCAs are patent without evidence of a proximal branch occlusion or flow limiting A1 or M1 stenosis. Widespread branch vessel atherosclerosis is again seen including chronic moderate bilateral M2 stenoses in a new severe distal right A2 stenosis. No aneurysm is identified. Posterior circulation: The intracranial vertebral arteries are patent to the basilar with up to mild stenosis of the strongly dominant right V4 segment. Multiple severe stenoses are again noted of the hypoplastic left V4 segment. The left PICA is patent proximally. There is a severe stenosis of the origin of the right eye AICA. Patent SCA origins are seen bilaterally, although the left SCA appears severely stenotic or chronically occluded just beyond its origin, and there is also a severe stenosis of the proximal right SCA. The basilar artery is widely patent. There is a patent left posterior communicating artery. There are moderate right and severe left P2 stenoses which have progressed. No aneurysm is identified. Venous sinuses: Patent. Anatomic variants: None. Review of the  MIP images confirms the above findings These results were called by telephone at the time of interpretation on 09/22/2022 at 6:22 pm to Dr. CSu Monks, who verbally acknowledged these results. IMPRESSION: 1. No emergent large vessel  occlusion. 2. Advanced atherosclerosis in the head and neck as detailed above including 70-75% stenosis of the proximal right ICA and 75+% stenosis of the proximal left ICA. 3. Chronic occlusion of the left vertebral artery at its origin with thready distal reconstitution. 4. Partially visualized large left upper lobe lung mass, 3 cm right upper lobe mass, and hilar and mediastinal lymphadenopathy concerning for lung cancer. Chest CT is recommended for further evaluation. 5. Aortic Atherosclerosis (ICD10-I70.0) and Emphysema (ICD10-J43.9). Electronically Signed   By: Logan Bores M.D.   On: 09/22/2022 18:48   DG Chest Port 1 View  Result Date: 09/22/2022 CLINICAL DATA:  Short of breath, stroke symptoms EXAM: PORTABLE CHEST 1 VIEW COMPARISON:  06/27/2021 FINDINGS: Single frontal view of the chest demonstrates a stable cardiac silhouette. There are large masses within the bilateral upper lobes, left greater than right, consistent with malignancy. Diffuse interstitial prominence is seen throughout the lungs, of uncertain acuity. Favor multifocal fibrosis and scarring. Lymphangitic spread of disease could give a similar appearance. No effusion or pneumothorax. No acute bony abnormality. IMPRESSION: 1. Enlarging bilateral upper lobe pulmonary masses consistent with progressive malignancy. If the patient would be a therapy candidate should neoplasm be detected, CT chest with IV contrast is recommended. 2. Diffuse interstitial prominence throughout the lungs, favor chronic scarring/fibrosis over lymphangitic spread of disease. Electronically Signed   By: Randa Ngo M.D.   On: 09/22/2022 18:36   CT HEAD CODE STROKE WO CONTRAST  Result Date: 09/22/2022 CLINICAL DATA:  Code stroke.  Neuro deficit, acute, stroke suspected. Weakness. Unresponsive. EXAM: CT HEAD WITHOUT CONTRAST TECHNIQUE: Contiguous axial images were obtained from the base of the skull through the vertex without intravenous contrast. RADIATION DOSE REDUCTION: This exam was performed according to the departmental dose-optimization program which includes automated exposure control, adjustment of the mA and/or kV according to patient size and/or use of iterative reconstruction technique. COMPARISON:  Head CT and MRI 06/27/2021 FINDINGS: Brain: There is no evidence of an acute large territory infarct, intracranial hemorrhage, mass, midline shift, or extra-axial fluid collection. A background of advanced cerebral atrophy and chronic small vessel ischemia is again noted with unchanged ventriculomegaly. There is a chronic right ACA infarct with asymmetric ex vacuo dilatation of the right lateral ventricle. Chronic lacunar infarcts are again noted in the deep cerebral white matter bilaterally, basal ganglia, thalami, right cerebral peduncle, and both cerebellar hemispheres. Vascular: Calcified atherosclerosis at the skull base. No hyperdense vessel. Skull: No acute fracture or suspicious osseous lesion. Sinuses/Orbits: Paranasal sinuses and mastoid air cells are clear. Bilateral cataract extraction. Other: None. ASPECTS [facility] Stroke Program Early CT Score) Not scored given extensive chronic ischemia and no localizing findings in the provided clinical history. IMPRESSION: 1. No evidence of acute intracranial abnormality. 2. Advanced cerebral atrophy and chronic ischemia. These results were communicated to Dr. Quinn Axe at 6:07 pm on 09/22/2022 by text page via the [facility] messaging system. Electronically Signed   By: Logan Bores M.D.   On: 09/22/2022 18:07    EKG: Independently reviewed. Sinus rhythm, PACs.  Assessment and Plan  Acute neurologic symptoms concerning for TIA versus acute ischemic stroke Patient with history of multiple  prior strokes with residual left-sided weakness, dementia brought in by family for confusion and aphasia concerning for TIA versus acute ischemic stroke. NIHSS 16 on arrival to the ED.  CT head showed no acute process.  TNK was not administered due to presentation outside the window.  CTA showed no LVO. Showing advanced atherosclerosis in the  head and neck including 70 to 75% stenosis of the proximal right ICA and 75+ percent stenosis of the proximal left ICA.  Chronic occlusion of the left vertebral artery at its origin with thready distal reconstitution.   -Neurology following, appreciate recommendations -Permissive hypertension x 48 hours from symptom onset or until stroke ruled out by MRI.  Goal blood pressure <220/110.  PRN IV meds for blood pressure above these parameters.  Avoid oral antihypertensives. -MRI brain without contrast -Echocardiogram -A1c, lipid panel -Plavix 75 mg daily (allergic to aspirin) -Frequent neurochecks -Stat CT head for any change in neuroexam -Telemetry monitoring -PT/OT/SLP -Stroke education -Urinalysis  Hypoxemia and imaging findings concerning for lung malignancy Chest x-ray showing enlarging bilateral upper lobe pulmonary masses consistent with progressive malignancy.  CTA head and neck showing partially visualized large left upper lobe lung mass, 3 cm right upper lobe mass, and hilar and mediastinal lymphadenopathy concerning for lung cancer.  Oxygen saturation in the mid 80s on room air, currently stable on 2 L supplemental oxygen.  Discussed CT findings with the patient's son.  Per son, patient is not on home oxygen and has never been diagnosed with lung cancer in the past.  He does mention that she used to be a heavy smoker, smoked 1 to 2 packs of cigarettes daily for 30+ years and no longer smoking now. CTA chest ordered to rule out PE and for further evaluation of pulmonary masses.  Consider palliative care consultation during daytime.  Continue supplemental  oxygen.  COPD Stable, no wheezing.  DuoNeb as needed.  Dementia Delirium precautions.  Insulin-dependent type 2 diabetes A1c 8.8 on 06/28/2021. Repeat A1c. Sensitive sliding scale insulin.  PVD -Continue Plavix  DVT prophylaxis: Lovenox Code Status: Full Code (discussed with the patient's son) Family Communication: Son Pharmacologist at bedside. Level of care: Telemetry bed Admission status: It is my clinical opinion that admission to INPATIENT is reasonable and necessary because of the expectation that this patient will require hospital care that crosses at least 2 midnights to treat this condition based on the medical complexity of the problems presented.  Given the aforementioned information, the predictability of an adverse outcome is felt to be significant.   Shela Leff MD [facility]  If 7PM-7AM, please contact night-coverage [email or web address]  09/22/2022, 7:24 PM

## 2022-09-22 NOTE — ED Triage Notes (Signed)
PT arrives via EMS from home with code stroke activated. See Code stroke team note.

## 2022-09-23 ENCOUNTER — Inpatient Hospital Stay (HOSPITAL_COMMUNITY): Payer: 59

## 2022-09-23 ENCOUNTER — Encounter (HOSPITAL_COMMUNITY): Payer: Self-pay | Admitting: Internal Medicine

## 2022-09-23 DIAGNOSIS — I6389 Other cerebral infarction: Secondary | ICD-10-CM

## 2022-09-23 DIAGNOSIS — J9621 Acute and chronic respiratory failure with hypoxia: Secondary | ICD-10-CM

## 2022-09-23 DIAGNOSIS — R2990 Unspecified symptoms and signs involving the nervous system: Secondary | ICD-10-CM | POA: Diagnosis not present

## 2022-09-23 DIAGNOSIS — R918 Other nonspecific abnormal finding of lung field: Secondary | ICD-10-CM | POA: Diagnosis not present

## 2022-09-23 LAB — BASIC METABOLIC PANEL
Anion gap: 9 (ref 5–15)
BUN: 16 mg/dL (ref 8–23)
CO2: 22 mmol/L (ref 22–32)
Calcium: 8.6 mg/dL — ABNORMAL LOW (ref 8.9–10.3)
Chloride: 107 mmol/L (ref 98–111)
Creatinine, Ser: 0.82 mg/dL (ref 0.44–1.00)
GFR, Estimated: >60 mL/min (ref >60)
Glucose, Bld: 82 mg/dL (ref 70–99)
Potassium: 4.6 mmol/L (ref 3.5–5.1)
Sodium: 138 mmol/L (ref 135–145)

## 2022-09-23 LAB — CBC
HCT: 31.9 % — ABNORMAL LOW (ref 36.0–46.0)
Hemoglobin: 9.7 g/dL — ABNORMAL LOW (ref 12.0–15.0)
MCH: 21.8 pg — ABNORMAL LOW (ref 26.0–34.0)
MCHC: 30.4 g/dL (ref 30.0–36.0)
MCV: 71.8 fL — ABNORMAL LOW (ref 80.0–100.0)
Platelets: 198 10*3/uL (ref 150–400)
RBC: 4.44 MIL/uL (ref 3.87–5.11)
RDW: 20.7 % — ABNORMAL HIGH (ref 11.5–15.5)
WBC: 8.3 10*3/uL (ref 4.0–10.5)
nRBC: 0 % (ref 0.0–0.2)

## 2022-09-23 LAB — PROCALCITONIN: Procalcitonin: <0.1 ng/mL

## 2022-09-23 LAB — LIPID PANEL
Cholesterol: 79 mg/dL (ref 0–200)
HDL: 27 mg/dL — ABNORMAL LOW (ref >40)
LDL Cholesterol: 39 mg/dL (ref 0–99)
Total CHOL/HDL Ratio: 2.9 RATIO
Triglycerides: 67 mg/dL (ref <150)
VLDL: 13 mg/dL (ref 0–40)

## 2022-09-23 LAB — GLUCOSE, CAPILLARY
Glucose-Capillary: 91 mg/dL (ref 70–99)
Glucose-Capillary: 93 mg/dL (ref 70–99)
Glucose-Capillary: 177 mg/dL — ABNORMAL HIGH (ref 70–99)
Glucose-Capillary: 139 mg/dL — ABNORMAL HIGH (ref 70–99)
Glucose-Capillary: 210 mg/dL — ABNORMAL HIGH (ref 70–99)

## 2022-09-23 LAB — ECHOCARDIOGRAM COMPLETE
Height: 62.008 in
Weight: 2363.33 oz

## 2022-09-23 LAB — BRAIN NATRIURETIC PEPTIDE: B Natriuretic Peptide: 38.6 pg/mL (ref 0.0–100.0)

## 2022-09-23 MED ORDER — INSULIN ASPART 100 UNIT/ML IJ SOLN
0.0000 [IU] | Freq: Three times a day (TID) | INTRAMUSCULAR | Status: DC
  Administered 2022-09-23: 2 [IU] via SUBCUTANEOUS
  Administered 2022-09-24: 8 [IU] via SUBCUTANEOUS
  Administered 2022-09-25: 2 [IU] via SUBCUTANEOUS

## 2022-09-23 MED ORDER — IOHEXOL 9 MG/ML PO SOLN
500.0000 mL | ORAL | Status: AC
  Administered 2022-09-23 (×2): 500 mL via ORAL

## 2022-09-23 MED ORDER — IOHEXOL 350 MG/ML SOLN
50.0000 mL | Freq: Once | INTRAVENOUS | Status: AC | PRN
  Administered 2022-09-23: 50 mL via INTRAVENOUS

## 2022-09-23 NOTE — Progress Notes (Addendum)
[facility] PROGRESS NOTE   Crystal Moore [id number] DOB: 02/15/1946 DOA: 09/22/2022  PCP: [name], MD  Brief History/Interval Summary: 77 y.o. female with medical history significant of COPD, multiple prior strokes with residual left-sided weakness, dementia, insulin-dependent type 2 diabetes, seizures, PVD status post bilateral stenting presented to the ED via EMS as code stroke for not acting like herself and having difficulty communicating. Patient was not able to give much information due to confusion and slight expressive aphasia.  She was hospitalized for further management.  Consultants: Neurology  Procedures: None yet    Subjective/Interval History: Patient pleasantly confused.  Denies any pain issues.  Does follow commands.    Assessment/Plan:  Transient confusion and aphasia/concern for TIA versus stroke MRI did not show any acute stroke.  LDL is 39. Neurology has been consulted.  Echocardiogram is pending. Patient noted to be on Plavix.  There is a reported allergy to aspirin. PT and OT evaluation. Patient does have residual left-sided weakness from previous stroke.  Lung masses Imaging studies raise concern for masses in the lung. CT of the chest showed new bilateral pulmonary masses worrisome for neoplasm/metastatic disease.  The dominant masses in the left upper lobe.  New mediastinal and bilateral hilar lymphadenopathy also noted. Patient appears to have metastatic cancer of unknown primary but thought to be lung cancer.  Patient used to be heavy smoker previously. Discussed with patient's daughter.  She wants to know the diagnosis if possible and so amenable to pursuing biopsy.  But she understands the patient may not be a candidate for treatments due to her poor baseline quality of life and poor functional performance.  Will proceed with CT of the abdomen and pelvis to look for primaries and also identify targets for biopsy. CTAP  without any worrisome findings. Will discuss with pulmonology tomorrow regarding next steps.  Acute respiratory failure with hypoxia Patient with saturation sin the 80's Noted to be short of breath at admission. Improved with O2. CTA without PE but does show lung masses.  Noted to be on 2 L of oxygen via nasal cannula.  Wean down as tolerated to maintain saturations greater than 90%.    History of COPD Seems to be stable.  Dementia Seems to be stable.  Insulin-dependent diabetes mellitus type 2 HbA1c 8.8 in 2022.  A1c from this admission is pending.  Continue SSI.  Peripheral artery disease Continue Plavix.  Microcytic anemia No evidence of overt bleeding.  Continue to monitor.   DVT Prophylaxis: Lovenox Code Status: Full code Family Communication: Discussed with patient's daughter Disposition Plan: Hopefully return home when improved  Status is: Inpatient Remains inpatient appropriate because: Lung masses, concern for TIA      Medications: Scheduled:   stroke: early stages of recovery book   Does not apply Once   clopidogrel  75 mg Oral Daily   enoxaparin (LOVENOX) injection  40 mg Subcutaneous Q24H   insulin aspart  0-9 Units Subcutaneous Q4H   sodium chloride flush  3 mL Intravenous Once   Continuous: [id number] **OR** acetaminophen, ipratropium-albuterol  Antibiotics: Anti-infectives (From admission, onward)    None       Objective:  Vital Signs  Vitals:   09/23/22 0245 09/23/22 0300 09/23/22 0400 09/23/22 0600  BP: (!) 130/46     Pulse: (!) 56     Resp: '15 14 14 16  '$ Temp: 97.8 F (36.6 C)     TempSrc: Axillary     SpO2: 98%  Weight: 67 kg     Height: 5' 2.01" (1.575 m)       Intake/Output Summary (Last 24 hours) at 09/23/2022 0939 Last data filed at 09/23/2022 0245 Gross per 24 hour  Intake 120 ml  Output --  Net 120 ml   Filed Weights   09/22/22 1700 09/23/22 0245  Weight: 67 kg 67 kg    General appearance: Awake alert.   In no distress.  Pleasantly confused Resp: Coarse breath sounds bilaterally.  No definite wheezing or rhonchi.  No crackles. Cardio: S1-S2 is normal regular.  No S3-S4.  No rubs murmurs or bruit GI: Abdomen is soft.  Nontender nondistended.  Bowel sounds are present normal.  No masses organomegaly Extremities: No edema.  Full range of motion of lower extremities. Neurologic: Disoriented.  Left-sided weakness noted.   Lab Results:  Data Reviewed: I have personally reviewed following labs and reports of the imaging studies  CBC: Recent Labs  Lab 09/22/22 1750 09/22/22 1755 09/23/22 0400  WBC 7.7  --  8.3  NEUTROABS 4.5  --   --   HGB 10.2* 11.6* 9.7*  HCT 32.7* 34.0* 31.9*  MCV 72.3*  --  71.8*  PLT 294  --  99991111    Basic Metabolic Panel: Recent Labs  Lab 09/22/22 1750 09/22/22 1755 09/23/22 0400  NA 140 143 138  K 4.1 4.1 4.6  CL 107 106 107  CO2 22  --  22  GLUCOSE 133* 132* 82  BUN '22 23 16  '$ CREATININE 0.89 0.80 0.82  CALCIUM 8.7*  --  8.6*    GFR: Estimated Creatinine Clearance: 53.2 mL/min (by C-G formula based on SCr of 0.82 mg/dL).  Liver Function Tests: Recent Labs  Lab 09/22/22 1750  AST 18  ALT 20  ALKPHOS 87  BILITOT 0.4  PROT 7.7  ALBUMIN 2.7*     Coagulation Profile: Recent Labs  Lab 09/22/22 1750  INR 1.1     CBG: Recent Labs  Lab 09/22/22 1751 09/22/22 2054 09/22/22 2328 09/23/22 0413 09/23/22 0755  GLUCAP 127* 126* 111* 91 93    Lipid Profile: Recent Labs    09/23/22 0400  CHOL 79  HDL 27*  LDLCALC 39  TRIG 67  CHOLHDL 2.9     Radiology Studies: MR BRAIN WO CONTRAST  Result Date: 09/23/2022 CLINICAL DATA:  Initial evaluation for acute neuro deficit. EXAM: MRI HEAD WITHOUT CONTRAST TECHNIQUE: Multiplanar, multiecho pulse sequences of the brain and surrounding structures were obtained without intravenous contrast. COMPARISON:  Prior CT from earlier the same day as well as prior MRI from 06/27/2021. FINDINGS: Brain:  Diffuse prominence of the CSF containing spaces compatible with generalized cerebral atrophy. Patchy and confluent T2/FLAIR hyperintensity involving the periventricular deep white matter both cerebral hemispheres, consistent with advanced chronic microvascular ischemic disease. Patchy involvement of the pons noted. Remote lacunar infarcts present about the bilateral basal ganglia/left corona radiata and thalami. Associated wallerian degeneration at the right cerebral peduncle. Chronic right ACA distribution infarct noted. Few small remote bilateral cerebellar infarcts noted as well. No evidence for acute or subacute ischemia. Gray-white matter differentiation maintained. No acute or chronic intracranial blood products. No mass lesion, midline shift or mass effect. Marked ventriculomegaly, presumably related to chronic ischemic changes and underlying atrophy, similar to prior. No extra-axial fluid collection. Pituitary gland and suprasellar region within normal limits. Vascular: Major intracranial vascular flow voids are maintained. Skull and upper cervical spine: Craniocervical junction within normal limits. Degenerative thickening noted about the tectorial membrane.  Bone marrow signal intensity normal. No scalp soft tissue abnormality. Sinuses/Orbits: Prior bilateral ocular lens replacement. Paranasal sinuses are largely clear. No significant mastoid effusion. Other: None. IMPRESSION: 1. No acute intracranial abnormality. 2. Generalized cerebral atrophy with chronic microvascular ischemic disease, with multiple remote lacunar infarcts involving the bilateral basal ganglia, thalami, and cerebellum. Additional chronic right ACA territory infarct. 3. Marked ventriculomegaly, presumably related to chronic ischemic changes and underlying atrophy, similar to prior. Electronically Signed   By: Jeannine Boga M.D.   On: 09/23/2022 00:26   CT Angio Chest Pulmonary Embolism (PE) W or WO Contrast  Result Date:  09/22/2022 CLINICAL DATA:  High probability for PE. Pulmonary masses concerning for malignancy. EXAM: CT ANGIOGRAPHY CHEST WITH CONTRAST TECHNIQUE: Multidetector CT imaging of the chest was performed using the standard protocol during bolus administration of intravenous contrast. Multiplanar CT image reconstructions and MIPs were obtained to evaluate the vascular anatomy. RADIATION DOSE REDUCTION: This exam was performed according to the departmental dose-optimization program which includes automated exposure control, adjustment of the mA and/or kV according to patient size and/or use of iterative reconstruction technique. CONTRAST:  17m OMNIPAQUE IOHEXOL 350 MG/ML SOLN COMPARISON:  CT chest 02/15/2018 FINDINGS: Cardiovascular: Satisfactory opacification of the pulmonary arteries to the segmental level. No evidence of pulmonary embolism. Normal heart size. No pericardial effusion. There are atherosclerotic calcifications of the aorta. Mediastinum/Nodes: There is diffuse mediastinal and bilateral hilar lymphadenopathy which is new. Largest right hilar lymph node measures 14 mm short axis. Precarinal lymph node measures 13 mm short axis. Prevascular lymph nodes measure up to 10 mm short axis. Marked left hilar lymphadenopathy present with largest discrete lymph node measuring 2.2 x 2.6 cm image 6/55. Visualized esophagus and thyroid gland are within normal limits. Trachea and central airways are patent. Lungs/Pleura: Mild emphysematous changes are again seen. There is a new anterior left upper lobe pulmonary mass abutting the mediastinum, pleura and hilum measuring 7.0 x 4.5 x 4.3 cm. Second largest mass is in the right upper lobe measuring 3.5 by 3.1 by 2.9 cm. There is also mass in the superior segment of the left lower lobe measuring 3.1 x 2.2 cm. There is some patchy ground-glass opacities in the lung bases. There is no pleural effusion or pneumothorax. Chronic appearing interstitial opacities persist in both  lungs. There is a calcified granuloma in the right lower lobe. Upper Abdomen: No acute abnormality. There is a surgical clip or staple in the stomach measuring 15 mm in length. Musculoskeletal: No chest wall abnormality. No acute or significant osseous findings. Review of the MIP images confirms the above findings. IMPRESSION: 1. No evidence for pulmonary embolism. 2. New bilateral pulmonary masses worrisome for neoplasm/metastatic disease. The dominant masses in the left upper lobe. 3. New mediastinal and bilateral hilar lymphadenopathy most significant in the left hilum. 4. Patchy ground-glass opacities in the lung bases may be infectious/inflammatory or related to edema. Aortic Atherosclerosis (ICD10-I70.0). Electronically Signed   By: ARonney AstersM.D.   On: 09/22/2022 22:22   CT ANGIO HEAD NECK W WO CM (CODE STROKE)  Result Date: 09/22/2022 CLINICAL DATA:  Neuro deficit, acute, stroke suspected. Possible aphasia. Weakness. Unresponsive. EXAM: CT ANGIOGRAPHY HEAD AND NECK TECHNIQUE: Multidetector CT imaging of the head and neck was performed using the standard protocol during bolus administration of intravenous contrast. Multiplanar CT image reconstructions and MIPs were obtained to evaluate the vascular anatomy. Carotid stenosis measurements (when applicable) are obtained utilizing NASCET criteria, using the distal internal carotid diameter as the  denominator. RADIATION DOSE REDUCTION: This exam was performed according to the departmental dose-optimization program which includes automated exposure control, adjustment of the mA and/or kV according to patient size and/or use of iterative reconstruction technique. CONTRAST:  71m OMNIPAQUE IOHEXOL 350 MG/ML SOLN COMPARISON:  Head and neck CTA 06/27/2021 FINDINGS: CTA NECK FINDINGS Aortic arch: Normal variant aortic arch branching pattern with common origin of the brachiocephalic and left common carotid arteries. Mild-to-moderate atherosclerosis without  significant stenosis of the arch vessel origins. Right carotid system: Patent with extensive, predominantly calcified plaque at the carotid bifurcation resulting in 70-75% stenosis of the proximal ICA, similar to the prior study. Left carotid system: Patent with extensive calcified and soft plaque about the carotid bifurcation resulting in 75% or greater stenosis of the proximal ICA, similar to the prior study. Vertebral arteries: The right vertebral artery is patent with unchanged mild stenosis of its origin due to calcified plaque. There is unchanged occlusion of the left vertebral artery at its origin with faint opacification of thready V2 and V3 segments. Skeleton: No suspicious osseous lesion. Other neck: No evidence of cervical lymphadenopathy or mass. Upper chest: New 3.1 x 2.0 cm right upper lobe mass. Partially visualized large mass anteriorly in the left upper lobe measuring at least 7 cm with suspected left and possibly right hilar lymphadenopathy. Abnormal pre-vascular lymph nodes measuring up to 1.1 cm in short axis. Underlying emphysema. Review of the MIP images confirms the above findings CTA HEAD FINDINGS Anterior circulation: The internal carotid arteries are patent from skull base to carotid termini with atherosclerotic calcification resulting in moderate right and severe left cavernous stenoses, similar to prior. ACAs and MCAs are patent without evidence of a proximal branch occlusion or flow limiting A1 or M1 stenosis. Widespread branch vessel atherosclerosis is again seen including chronic moderate bilateral M2 stenoses in a new severe distal right A2 stenosis. No aneurysm is identified. Posterior circulation: The intracranial vertebral arteries are patent to the basilar with up to mild stenosis of the strongly dominant right V4 segment. Multiple severe stenoses are again noted of the hypoplastic left V4 segment. The left PICA is patent proximally. There is a severe stenosis of the origin of the  right eye AICA. Patent SCA origins are seen bilaterally, although the left SCA appears severely stenotic or chronically occluded just beyond its origin, and there is also a severe stenosis of the proximal right SCA. The basilar artery is widely patent. There is a patent left posterior communicating artery. There are moderate right and severe left P2 stenoses which have progressed. No aneurysm is identified. Venous sinuses: Patent. Anatomic variants: None. Review of the MIP images confirms the above findings These results were called by telephone at the time of interpretation on 09/22/2022 at 6:22 pm to Dr. CSu Monks, who verbally acknowledged these results. IMPRESSION: 1. No emergent large vessel occlusion. 2. Advanced atherosclerosis in the head and neck as detailed above including 70-75% stenosis of the proximal right ICA and 75+% stenosis of the proximal left ICA. 3. Chronic occlusion of the left vertebral artery at its origin with thready distal reconstitution. 4. Partially visualized large left upper lobe lung mass, 3 cm right upper lobe mass, and hilar and mediastinal lymphadenopathy concerning for lung cancer. Chest CT is recommended for further evaluation. 5. Aortic Atherosclerosis (ICD10-I70.0) and Emphysema (ICD10-J43.9). Electronically Signed   By: ALogan BoresM.D.   On: 09/22/2022 18:48   DG Chest Port 1 View  Result Date: 09/22/2022 CLINICAL DATA:  Short  of breath, stroke symptoms EXAM: PORTABLE CHEST 1 VIEW COMPARISON:  06/27/2021 FINDINGS: Single frontal view of the chest demonstrates a stable cardiac silhouette. There are large masses within the bilateral upper lobes, left greater than right, consistent with malignancy. Diffuse interstitial prominence is seen throughout the lungs, of uncertain acuity. Favor multifocal fibrosis and scarring. Lymphangitic spread of disease could give a similar appearance. No effusion or pneumothorax. No acute bony abnormality. IMPRESSION: 1. Enlarging  bilateral upper lobe pulmonary masses consistent with progressive malignancy. If the patient would be a therapy candidate should neoplasm be detected, CT chest with IV contrast is recommended. 2. Diffuse interstitial prominence throughout the lungs, favor chronic scarring/fibrosis over lymphangitic spread of disease. Electronically Signed   By: Randa Ngo M.D.   On: 09/22/2022 18:36   CT HEAD CODE STROKE WO CONTRAST  Result Date: 09/22/2022 CLINICAL DATA:  Code stroke. Neuro deficit, acute, stroke suspected. Weakness. Unresponsive. EXAM: CT HEAD WITHOUT CONTRAST TECHNIQUE: Contiguous axial images were obtained from the base of the skull through the vertex without intravenous contrast. RADIATION DOSE REDUCTION: This exam was performed according to the departmental dose-optimization program which includes automated exposure control, adjustment of the mA and/or kV according to patient size and/or use of iterative reconstruction technique. COMPARISON:  Head CT and MRI 06/27/2021 FINDINGS: Brain: There is no evidence of an acute large territory infarct, intracranial hemorrhage, mass, midline shift, or extra-axial fluid collection. A background of advanced cerebral atrophy and chronic small vessel ischemia is again noted with unchanged ventriculomegaly. There is a chronic right ACA infarct with asymmetric ex vacuo dilatation of the right lateral ventricle. Chronic lacunar infarcts are again noted in the deep cerebral white matter bilaterally, basal ganglia, thalami, right cerebral peduncle, and both cerebellar hemispheres. Vascular: Calcified atherosclerosis at the skull base. No hyperdense vessel. Skull: No acute fracture or suspicious osseous lesion. Sinuses/Orbits: Paranasal sinuses and mastoid air cells are clear. Bilateral cataract extraction. Other: None. ASPECTS [facility] Stroke Program Early CT Score) Not scored given extensive chronic ischemia and no localizing findings in the provided clinical history.  IMPRESSION: 1. No evidence of acute intracranial abnormality. 2. Advanced cerebral atrophy and chronic ischemia. These results were communicated to Dr. Quinn Axe at 6:07 pm on 09/22/2022 by text page via the [facility] messaging system. Electronically Signed   By: Logan Bores M.D.   On: 09/22/2022 18:07       LOS: 1 day   [facility] Pager on [email or web address]  09/23/2022, 9:39 AM

## 2022-09-23 NOTE — Progress Notes (Signed)
Echocardiogram 2D Echocardiogram has been performed.  Ronny Flurry 09/23/2022, 4:20 PM

## 2022-09-23 NOTE — Evaluation (Signed)
Speech Language Pathology Evaluation Patient Details Name: Crystal Moore MRN: [id number] DOB: 1946-05-07 Today's Date: 09/23/2022 Time: [id number] SLP Time Calculation (min) (ACUTE ONLY): 13 min  Problem List:  Patient Active Problem List   Diagnosis Date Noted   Neurological symptoms 09/22/2022   Hypoxemia 09/22/2022   Acute respiratory failure with hypoxia ([place])    Diabetic oculopathy ([place]) 11/14/2020   Hemiplegia of nondominant side as late effect of cerebrovascular disease ([place]) 11/14/2020   Hyperglycemia due to type 2 diabetes mellitus ([place]) 11/14/2020   Long term (current) use of insulin ([place]) 11/14/2020   Memory loss 11/14/2020   Nicotine dependence, unspecified, uncomplicated 0000000   Non-pressure chronic ulcer of other part of left foot with unspecified severity ([place]) 11/14/2020   Residual cognitive deficit as late effect of cerebrovascular accident 11/14/2020   Pressure injury of skin 09/16/2020   Acute blood loss anemia    Gastric AVM    Acute upper GI bleed 09/14/2020   Symptomatic anemia 09/14/2020   PVD (peripheral vascular disease) ([place]) 09/14/2020   Cellulitis of great toe of right foot 08/26/2020   CVA (cerebral vascular accident) ([place])    COVID-19 virus infection 07/2020   Ankle ulcer ([place]) 06/28/2020   Closed fracture of neck of left femur ([place]) 08/11/2019   COVID-19 virus detected 08/11/2019   Dementia ([place]) 08/11/2019   AKI (acute kidney injury) ([place]) 01/15/2019   Cerebral thrombosis with cerebral infarction 01/15/2019   TIA (transient ischemic attack) 01/13/2019   Insulin dependent type 2 diabetes mellitus ([place]) 01/13/2019   Hypokalemia 12/18/2018   Acute CVA (cerebrovascular accident) ([place]) 12/17/2018   Malnutrition of moderate degree 05/31/2018   Hematochezia 05/29/2018   Ischemic colitis ([place]) 05/29/2018   Acute encephalopathy 05/29/2018   Type 2 diabetes mellitus ([place]) 05/29/2018   Hypertension 05/29/2018   Depression 05/29/2018   COPD  (chronic obstructive pulmonary disease) ([place]) 05/29/2018   Sepsis ([place]) 05/28/2018   Diabetes ([place]) 03/16/2018   ILD (interstitial lung disease) ([place]) 02/18/2018   Chronic respiratory failure with hypoxia ([place]) 02/18/2018   COPD (chronic obstructive pulmonary disease) ([place]) 01/12/2018   Tobacco abuse 01/12/2018   Arthritis 07/29/2017   Asthma 07/29/2017   Gastroesophageal reflux disease 07/29/2017   Hyperlipidemia 07/29/2017   Left hemiplegia ([place]) 07/29/2017   Sciatica 07/29/2017   Vitamin D deficiency 07/29/2017   Past Medical History:  Past Medical History:  Diagnosis Date   Arthritis    COPD (chronic obstructive pulmonary disease) ([place])    COVID-19 virus infection 07/2020   CVA (cerebral vascular accident) ([place])    Dementia ([place])    Diabetes mellitus without complication ([place])    Hemiparesis affecting left side as late effect of cerebrovascular accident (CVA) ([place])    Pyelonephritis    Seizures ([place])    Stroke ([place])    Tobacco use disorder    UTI (urinary tract infection)    Past Surgical History:  Past Surgical History:  Procedure Laterality Date   ABDOMINAL AORTOGRAM W/LOWER EXTREMITY N/A 08/30/2020   Procedure: ABDOMINAL AORTOGRAM W/LOWER EXTREMITY;  Surgeon: Cherre Robins, MD;  Location: [facility];  Service: Cardiovascular;  Laterality: N/A;   ABDOMINAL AORTOGRAM W/LOWER EXTREMITY N/A 09/04/2020   Procedure: ABDOMINAL AORTOGRAM W/LOWER EXTREMITY;  Surgeon: Cherre Robins, MD;  Location: [facility];  Service: Cardiovascular;  Laterality: N/A;   ESOPHAGOGASTRODUODENOSCOPY (EGD) WITH PROPOFOL N/A 09/16/2020   Procedure: ESOPHAGOGASTRODUODENOSCOPY (EGD) WITH PROPOFOL;  Surgeon: Lavena Bullion, DO;  Location: [facility];  Service: Gastroenterology;  Laterality: N/A;   HEMOSTASIS CLIP PLACEMENT  09/16/2020   Procedure: HEMOSTASIS CLIP PLACEMENT;  Surgeon: Lavena Bullion, DO;  Location: [facility];  Service: Gastroenterology;;   HOT HEMOSTASIS  N/A 09/16/2020   Procedure: HOT HEMOSTASIS (ARGON PLASMA COAGULATION/BICAP);  Surgeon: Lavena Bullion, DO;  [facility];  Service: Gastroenterology;  Laterality: N/A;   PERIPHERAL VASCULAR INTERVENTION Right 08/30/2020   Procedure: PERIPHERAL VASCULAR INTERVENTION;  Surgeon: Cherre Robins, MD;  Location: [facility];  Service: Cardiovascular;  Laterality: Right;  femoral popliteal   PERIPHERAL VASCULAR INTERVENTION Left 09/04/2020   Procedure: PERIPHERAL VASCULAR INTERVENTION;  Surgeon: Cherre Robins, MD;  Location: [facility];  Service: Cardiovascular;  Laterality: Left;  SFA   TUBAL LIGATION     HPI:  Pt is a 77 yo female admitted with aphasia L facial droop and decreased alertness.  MRI: no acute finding.  Chronic small vessel disease and ventriculomegaly.  PMH:  old CVAs with L hemiparesis, COPD, covid, SZ, DM, PVD. SLE 01/16/2019 with mod-severe expressive and receptive language deficits   Assessment / Plan / Recommendation Clinical Impression  Pt seen for speech-language-cognitive assessment without family at bedside. From SLE in 2020, pt has a history of expressive and receptive aphasia. Pt demonstrated cognitive impairments throughout testing as confirmed by OT who spoke with pt's daughter. Pt demonstrated functional language for tasks assessed today. She was able to respond in semantically appropriate sentences, named common objects with 100% accuracy and intelligibility. Difficulty in divergent naming and could name one animal likely from cognitive source versus true language deficit. She was only oriented to her name but able to recall correct month after 1 minute delay. She accurately stated daughter's name. She computed simple mental math with 50% accuracy. She knew she moved from [place] but could not remember to what city. She did not have an acute stroke per imaging and therapist suspects her cognitive abilities are close or at baseline. Do not recommend  continued ST at this time for cognition.    SLP Assessment  SLP Recommendation/Assessment:  (suspect at or close to baseline cognitive function) SLP Visit Diagnosis: Cognitive communication deficit (R41.841)    Recommendations for follow up therapy are one component of a multi-disciplinary discharge planning process, led by the attending physician.  Recommendations may be updated based on patient status, additional functional criteria and insurance authorization.    Follow Up Recommendations  No SLP follow up    Assistance Recommended at Discharge  Frequent or constant Supervision/Assistance  Functional Status Assessment Patient has not had a recent decline in their functional status  Frequency and Duration           SLP Evaluation Cognition  Overall Cognitive Status: History of cognitive impairments - at baseline (OT spoke with daughter) Arousal/Alertness: Awake/alert Orientation Level: Oriented to person;Disoriented to place;Disoriented to time;Disoriented to situation Year:  (2000) Month:  ("I don't know") Attention: Sustained Sustained Attention: Appears intact Memory:  (recalled month after one minute but not the year) Awareness: Impaired Awareness Impairment: Intellectual impairment;Anticipatory impairment;Emergent impairment Problem Solving: Impaired Problem Solving Impairment: Verbal basic Safety/Judgment: Impaired       Comprehension  Auditory Comprehension Overall Auditory Comprehension: Appears within functional limits for tasks assessed (for one step commands and simple language) Commands: Within Functional Limits (one step) Visual Recognition/Discrimination Discrimination: Not tested Reading Comprehension Reading Status: Not tested    Expression Expression Primary Mode of Expression: Verbal Verbal Expression Overall Verbal Expression: Appears within functional limits for tasks  assessed Initiation: No impairment Level of Generative/Spontaneous  Verbalization: Sentence Repetition: No impairment (simple sentence) Naming: Impairment Confrontation: Within functional limits Divergent:  (named one animal) Pragmatics: No impairment Written Expression Dominant Hand: Right Written Expression: Not tested   Oral / Motor  Oral Motor/Sensory Function Overall Oral Motor/Sensory Function: Within functional limits Motor Speech Overall Motor Speech: Appears within functional limits for tasks assessed Respiration: Within functional limits Resonance: Within functional limits Articulation: Within functional limitis Intelligibility: Intelligible Motor Planning: Witnin functional limits Motor Speech Errors: Not applicable            Houston Siren 09/23/2022, 11:54 AM

## 2022-09-23 NOTE — ED Notes (Signed)
ED TO INPATIENT HANDOFF REPORT  ED Nurse Name and Phone #: Gregary Signs [phone]  S Name/Age/Gender Crystal Moore 77 y.o. female Room/Bed: 040C/040C  Code Status   Code Status: Full Code  Home/SNF/Other Home Patient oriented to: self Is this baseline? Yes   Triage Complete: Triage complete  Chief Complaint Neurological symptoms [R29.90]  Triage Note PT arrives via EMS from home with code stroke activated. See Code stroke team note.    Allergies Allergies  Allergen Reactions   Aspirin     Can not take due to previous GI bleed - per daughter    Level of Care/Admitting Diagnosis ED Disposition     ED Disposition  Admit   Condition  --   [place]: [place] [[id number]]  Level of Care: Telemetry Medical [104]  May admit patient to [facility] or [facility] if equivalent level of care is available:: Yes  Covid Evaluation: Asymptomatic - no recent exposure (last 10 days) testing not required  Diagnosis: Neurological symptoms [id number]  Admitting Physician: Shela Leff [id number]  Attending Physician: Shela Leff [id number]  Certification:: I certify this patient will need inpatient services for at least 2 midnights  Estimated Length of Stay: 2          B Medical/Surgery History Past Medical History:  Diagnosis Date   Arthritis    COPD (chronic obstructive pulmonary disease) ([place])    COVID-19 virus infection 07/2020   CVA (cerebral vascular accident) ([place])    Diabetes mellitus without complication ([place])    Hemiparesis affecting left side as late effect of cerebrovascular accident (CVA) ([place])    Pyelonephritis    Seizures ([place])    Stroke ([place])    Tobacco use disorder    UTI (urinary tract infection)    Past Surgical History:  Procedure Laterality Date   ABDOMINAL AORTOGRAM W/LOWER EXTREMITY N/A 08/30/2020   Procedure: ABDOMINAL AORTOGRAM W/LOWER EXTREMITY;  Surgeon: Cherre Robins, MD;  Location: [facility];  Service: Cardiovascular;  Laterality: N/A;   ABDOMINAL AORTOGRAM W/LOWER EXTREMITY N/A 09/04/2020   Procedure: ABDOMINAL AORTOGRAM W/LOWER EXTREMITY;  Surgeon: Cherre Robins, MD;  Location: [facility];  Service: Cardiovascular;  Laterality: N/A;   ESOPHAGOGASTRODUODENOSCOPY (EGD) WITH PROPOFOL N/A 09/16/2020   Procedure: ESOPHAGOGASTRODUODENOSCOPY (EGD) WITH PROPOFOL;  Surgeon: Lavena Bullion, DO;  Location: [facility];  Service: Gastroenterology;  Laterality: N/A;   HEMOSTASIS CLIP PLACEMENT  09/16/2020   Procedure: HEMOSTASIS CLIP PLACEMENT;  Surgeon: Lavena Bullion, DO;  Location: [facility];  Service: Gastroenterology;;   HOT HEMOSTASIS N/A 09/16/2020   Procedure: HOT HEMOSTASIS (ARGON PLASMA COAGULATION/BICAP);  Surgeon: Lavena Bullion, DO;  Location[facility];  Service: Gastroenterology;  Laterality: N/A;   PERIPHERAL VASCULAR INTERVENTION Right 08/30/2020   Procedure: PERIPHERAL VASCULAR INTERVENTION;  Surgeon: Cherre Robins, MD;  Location: [facility];  Service: Cardiovascular;  Laterality: Right;  femoral popliteal   PERIPHERAL VASCULAR INTERVENTION Left 09/04/2020   Procedure: PERIPHERAL VASCULAR INTERVENTION;  Surgeon: Cherre Robins, MD;  Location: [facility];  Service: Cardiovascular;  Laterality: Left;  SFA   TUBAL LIGATION       A IV Location/Drains/Wounds Patient Lines/Drains/Airways Status     Active Line/Drains/Airways     Name Placement date Placement time Site Days   Peripheral IV 09/22/22 20 G Right Antecubital 09/22/22  1803  Antecubital  1   External Urinary Catheter 09/15/20  --  --  738   Pressure  Injury 09/15/20 Buttocks Mid Stage 2 -  Partial thickness loss of dermis presenting as a shallow open injury with a red, pink wound bed without slough. small slit in middle of buttocks unable to measure pink in color 09/15/20  2040  -- 738   Wound / Incision (Open or Dehisced) 08/27/20 Diabetic ulcer Ankle Left;Lateral red,  pink. yellow 08/27/20  0025  Ankle  757   Wound / Incision (Open or Dehisced) 09/04/20 Non-pressure wound Toe (Comment  which one) Anterior;Right pink 09/04/20  1000  Toe (Comment  which one)  749   Wound / Incision (Open or Dehisced) 06/27/21 Foot Left;Lateral 06/27/21  2043  Foot  453            Intake/Output Last 24 hours No intake or output data in the 24 hours ending 09/23/22 0134  Labs/Imaging Results for orders placed or performed during the hospital encounter of 09/22/22 (from the past 48 hour(s))  Protime-INR     Status: None   Collection Time: 09/22/22  5:50 PM  Result Value Ref Range   Prothrombin Time 14.4 11.4 - 15.2 seconds   INR 1.1 0.8 - 1.2    Comment: (NOTE) INR goal varies based on device and disease states. Performed at [facility], [place] [address], [place], [place] [address]   APTT     Status: None   Collection Time: 09/22/22  5:50 PM  Result Value Ref Range   aPTT 29 24 - 36 seconds    Comment: Performed at [facility] [address], [place], [place] [address]  CBC     Status: Abnormal   Collection Time: 09/22/22  5:50 PM  Result Value Ref Range   WBC 7.7 4.0 - 10.5 K/uL   RBC 4.52 3.87 - 5.11 MIL/uL   Hemoglobin 10.2 (L) 12.0 - 15.0 g/dL   HCT 32.7 (L) 36.0 - 46.0 %   MCV 72.3 (L) 80.0 - 100.0 fL   MCH 22.6 (L) 26.0 - 34.0 pg   MCHC 31.2 30.0 - 36.0 g/dL   RDW 21.0 (H) 11.5 - 15.5 %   Platelets 294 150 - 400 K/uL   nRBC 0.0 0.0 - 0.2 %    Comment: Performed at [facility], [place] [address] [place], [place] [address]  Differential     Status: None   Collection Time: 09/22/22  5:50 PM  Result Value Ref Range   Neutrophils Relative % 58 %   Neutro Abs 4.5 1.7 - 7.7 K/uL   Lymphocytes Relative 30 %   Lymphs Abs 2.3 0.7 - 4.0 K/uL   Monocytes Relative 7 %   Monocytes Absolute 0.5 0.1 - 1.0 K/uL   Eosinophils Relative 4 %   Eosinophils Absolute 0.3 0.0 - 0.5 K/uL   Basophils Relative 1 %   Basophils Absolute 0.0 0.0 - 0.1  K/uL   Immature Granulocytes 0 %   Abs Immature Granulocytes 0.02 0.00 - 0.07 K/uL    Comment: Performed at [facility] [address] [place], [place] [address]  Comprehensive metabolic panel     Status: Abnormal   Collection Time: 09/22/22  5:50 PM  Result Value Ref Range   Sodium 140 135 - 145 mmol/L   Potassium 4.1 3.5 - 5.1 mmol/L   Chloride 107 98 - 111 mmol/L   CO2 22 22 - 32 mmol/L   Glucose, Bld 133 (H) 70 - 99 mg/dL    Comment: Glucose reference range applies  only to samples taken after fasting for at least 8 hours.   BUN 22 8 - 23 mg/dL   Creatinine, Ser 0.89 0.44 - 1.00 mg/dL   Calcium 8.7 (L) 8.9 - 10.3 mg/dL   Total Protein 7.7 6.5 - 8.1 g/dL   Albumin 2.7 (L) 3.5 - 5.0 g/dL   AST 18 15 - 41 U/L   ALT 20 0 - 44 U/L   Alkaline Phosphatase 87 38 - 126 U/L   Total Bilirubin 0.4 0.3 - 1.2 mg/dL   GFR, Estimated >60 >60 mL/min    Comment: (NOTE) Calculated using the CKD-EPI Creatinine Equation (2021)    Anion gap 11 5 - 15    Comment: Performed at [facility], [place] [address], [place], [place] [address]  Ethanol     Status: None   Collection Time: 09/22/22  5:50 PM  Result Value Ref Range   Alcohol, Ethyl (B) <10 <10 mg/dL    Comment: (NOTE) Lowest detectable limit for serum alcohol is 10 mg/dL.  For medical purposes only. Performed at [facility], [place] [address] [place], [place] [address]   CBG monitoring, ED     Status: Abnormal   Collection Time: 09/22/22  5:51 PM  Result Value Ref Range   Glucose-Capillary 127 (H) 70 - 99 mg/dL    Comment: Glucose reference range applies only to samples taken after fasting for at least 8 hours.  I-stat chem 8, ED     Status: Abnormal   Collection Time: 09/22/22  5:55 PM  Result Value Ref Range   Sodium 143 135 - 145 mmol/L   Potassium 4.1 3.5 - 5.1 mmol/L   Chloride 106 98 - 111 mmol/L   BUN 23 8 - 23 mg/dL   Creatinine, Ser 0.80 0.44 - 1.00 mg/dL   Glucose, Bld 132 (H) 70 - 99 mg/dL    Comment:  Glucose reference range applies only to samples taken after fasting for at least 8 hours.   Calcium, Ion 1.17 1.15 - 1.40 mmol/L   TCO2 24 22 - 32 mmol/L   Hemoglobin 11.6 (L) 12.0 - 15.0 g/dL   HCT 34.0 (L) 36.0 - 46.0 %  CBG monitoring, ED     Status: Abnormal   Collection Time: 09/22/22  8:54 PM  Result Value Ref Range   Glucose-Capillary 126 (H) 70 - 99 mg/dL    Comment: Glucose reference range applies only to samples taken after fasting for at least 8 hours.  CBG monitoring, ED     Status: Abnormal   Collection Time: 09/22/22 11:28 PM  Result Value Ref Range   Glucose-Capillary 111 (H) 70 - 99 mg/dL    Comment: Glucose reference range applies only to samples taken after fasting for at least 8 hours.   CT Angio Chest Pulmonary Embolism (PE) W or WO Contrast  Result Date: 09/22/2022 CLINICAL DATA:  High probability for PE. Pulmonary masses concerning for malignancy. EXAM: CT ANGIOGRAPHY CHEST WITH CONTRAST TECHNIQUE: Multidetector CT imaging of the chest was performed using the standard protocol during bolus administration of intravenous contrast. Multiplanar CT image reconstructions and MIPs were obtained to evaluate the vascular anatomy. RADIATION DOSE REDUCTION: This exam was performed according to the departmental dose-optimization program which includes automated exposure control, adjustment of the mA and/or kV according to patient size and/or use of iterative reconstruction technique. CONTRAST:  48m OMNIPAQUE IOHEXOL 350 MG/ML SOLN COMPARISON:  CT chest 02/15/2018 FINDINGS: Cardiovascular: Satisfactory opacification of the pulmonary arteries to  the segmental level. No evidence of pulmonary embolism. Normal heart size. No pericardial effusion. There are atherosclerotic calcifications of the aorta. Mediastinum/Nodes: There is diffuse mediastinal and bilateral hilar lymphadenopathy which is new. Largest right hilar lymph node measures 14 mm short axis. Precarinal lymph node measures 13 mm  short axis. Prevascular lymph nodes measure up to 10 mm short axis. Marked left hilar lymphadenopathy present with largest discrete lymph node measuring 2.2 x 2.6 cm image 6/55. Visualized esophagus and thyroid gland are within normal limits. Trachea and central airways are patent. Lungs/Pleura: Mild emphysematous changes are again seen. There is a new anterior left upper lobe pulmonary mass abutting the mediastinum, pleura and hilum measuring 7.0 x 4.5 x 4.3 cm. Second largest mass is in the right upper lobe measuring 3.5 by 3.1 by 2.9 cm. There is also mass in the superior segment of the left lower lobe measuring 3.1 x 2.2 cm. There is some patchy ground-glass opacities in the lung bases. There is no pleural effusion or pneumothorax. Chronic appearing interstitial opacities persist in both lungs. There is a calcified granuloma in the right lower lobe. Upper Abdomen: No acute abnormality. There is a surgical clip or staple in the stomach measuring 15 mm in length. Musculoskeletal: No chest wall abnormality. No acute or significant osseous findings. Review of the MIP images confirms the above findings. IMPRESSION: 1. No evidence for pulmonary embolism. 2. New bilateral pulmonary masses worrisome for neoplasm/metastatic disease. The dominant masses in the left upper lobe. 3. New mediastinal and bilateral hilar lymphadenopathy most significant in the left hilum. 4. Patchy ground-glass opacities in the lung bases may be infectious/inflammatory or related to edema. Aortic Atherosclerosis (ICD10-I70.0). Electronically Signed   By: Ronney Asters M.D.   On: 09/22/2022 22:22   CT ANGIO HEAD NECK W WO CM (CODE STROKE)  Result Date: 09/22/2022 CLINICAL DATA:  Neuro deficit, acute, stroke suspected. Possible aphasia. Weakness. Unresponsive. EXAM: CT ANGIOGRAPHY HEAD AND NECK TECHNIQUE: Multidetector CT imaging of the head and neck was performed using the standard protocol during bolus administration of intravenous  contrast. Multiplanar CT image reconstructions and MIPs were obtained to evaluate the vascular anatomy. Carotid stenosis measurements (when applicable) are obtained utilizing NASCET criteria, using the distal internal carotid diameter as the denominator. RADIATION DOSE REDUCTION: This exam was performed according to the departmental dose-optimization program which includes automated exposure control, adjustment of the mA and/or kV according to patient size and/or use of iterative reconstruction technique. CONTRAST:  61m OMNIPAQUE IOHEXOL 350 MG/ML SOLN COMPARISON:  Head and neck CTA 06/27/2021 FINDINGS: CTA NECK FINDINGS Aortic arch: Normal variant aortic arch branching pattern with common origin of the brachiocephalic and left common carotid arteries. Mild-to-moderate atherosclerosis without significant stenosis of the arch vessel origins. Right carotid system: Patent with extensive, predominantly calcified plaque at the carotid bifurcation resulting in 70-75% stenosis of the proximal ICA, similar to the prior study. Left carotid system: Patent with extensive calcified and soft plaque about the carotid bifurcation resulting in 75% or greater stenosis of the proximal ICA, similar to the prior study. Vertebral arteries: The right vertebral artery is patent with unchanged mild stenosis of its origin due to calcified plaque. There is unchanged occlusion of the left vertebral artery at its origin with faint opacification of thready V2 and V3 segments. Skeleton: No suspicious osseous lesion. Other neck: No evidence of cervical lymphadenopathy or mass. Upper chest: New 3.1 x 2.0 cm right upper lobe mass. Partially visualized large mass anteriorly in the left upper  lobe measuring at least 7 cm with suspected left and possibly right hilar lymphadenopathy. Abnormal pre-vascular lymph nodes measuring up to 1.1 cm in short axis. Underlying emphysema. Review of the MIP images confirms the above findings CTA HEAD FINDINGS  Anterior circulation: The internal carotid arteries are patent from skull base to carotid termini with atherosclerotic calcification resulting in moderate right and severe left cavernous stenoses, similar to prior. ACAs and MCAs are patent without evidence of a proximal branch occlusion or flow limiting A1 or M1 stenosis. Widespread branch vessel atherosclerosis is again seen including chronic moderate bilateral M2 stenoses in a new severe distal right A2 stenosis. No aneurysm is identified. Posterior circulation: The intracranial vertebral arteries are patent to the basilar with up to mild stenosis of the strongly dominant right V4 segment. Multiple severe stenoses are again noted of the hypoplastic left V4 segment. The left PICA is patent proximally. There is a severe stenosis of the origin of the right eye AICA. Patent SCA origins are seen bilaterally, although the left SCA appears severely stenotic or chronically occluded just beyond its origin, and there is also a severe stenosis of the proximal right SCA. The basilar artery is widely patent. There is a patent left posterior communicating artery. There are moderate right and severe left P2 stenoses which have progressed. No aneurysm is identified. Venous sinuses: Patent. Anatomic variants: None. Review of the MIP images confirms the above findings These results were called by telephone at the time of interpretation on 09/22/2022 at 6:22 pm to Dr. Su Monks , who verbally acknowledged these results. IMPRESSION: 1. No emergent large vessel occlusion. 2. Advanced atherosclerosis in the head and neck as detailed above including 70-75% stenosis of the proximal right ICA and 75+% stenosis of the proximal left ICA. 3. Chronic occlusion of the left vertebral artery at its origin with thready distal reconstitution. 4. Partially visualized large left upper lobe lung mass, 3 cm right upper lobe mass, and hilar and mediastinal lymphadenopathy concerning for lung cancer.  Chest CT is recommended for further evaluation. 5. Aortic Atherosclerosis (ICD10-I70.0) and Emphysema (ICD10-J43.9). Electronically Signed   By: Logan Bores M.D.   On: 09/22/2022 18:48   DG Chest Port 1 View  Result Date: 09/22/2022 CLINICAL DATA:  Short of breath, stroke symptoms EXAM: PORTABLE CHEST 1 VIEW COMPARISON:  06/27/2021 FINDINGS: Single frontal view of the chest demonstrates a stable cardiac silhouette. There are large masses within the bilateral upper lobes, left greater than right, consistent with malignancy. Diffuse interstitial prominence is seen throughout the lungs, of uncertain acuity. Favor multifocal fibrosis and scarring. Lymphangitic spread of disease could give a similar appearance. No effusion or pneumothorax. No acute bony abnormality. IMPRESSION: 1. Enlarging bilateral upper lobe pulmonary masses consistent with progressive malignancy. If the patient would be a therapy candidate should neoplasm be detected, CT chest with IV contrast is recommended. 2. Diffuse interstitial prominence throughout the lungs, favor chronic scarring/fibrosis over lymphangitic spread of disease. Electronically Signed   By: Randa Ngo M.D.   On: 09/22/2022 18:36   CT HEAD CODE STROKE WO CONTRAST  Result Date: 09/22/2022 CLINICAL DATA:  Code stroke. Neuro deficit, acute, stroke suspected. Weakness. Unresponsive. EXAM: CT HEAD WITHOUT CONTRAST TECHNIQUE: Contiguous axial images were obtained from the base of the skull through the vertex without intravenous contrast. RADIATION DOSE REDUCTION: This exam was performed according to the departmental dose-optimization program which includes automated exposure control, adjustment of the mA and/or kV according to patient size and/or use of iterative  reconstruction technique. COMPARISON:  Head CT and MRI 06/27/2021 FINDINGS: Brain: There is no evidence of an acute large territory infarct, intracranial hemorrhage, mass, midline shift, or extra-axial fluid  collection. A background of advanced cerebral atrophy and chronic small vessel ischemia is again noted with unchanged ventriculomegaly. There is a chronic right ACA infarct with asymmetric ex vacuo dilatation of the right lateral ventricle. Chronic lacunar infarcts are again noted in the deep cerebral white matter bilaterally, basal ganglia, thalami, right cerebral peduncle, and both cerebellar hemispheres. Vascular: Calcified atherosclerosis at the skull base. No hyperdense vessel. Skull: No acute fracture or suspicious osseous lesion. Sinuses/Orbits: Paranasal sinuses and mastoid air cells are clear. Bilateral cataract extraction. Other: None. ASPECTS [facility] Stroke Program Early CT Score) Not scored given extensive chronic ischemia and no localizing findings in the provided clinical history. IMPRESSION: 1. No evidence of acute intracranial abnormality. 2. Advanced cerebral atrophy and chronic ischemia. These results were communicated to Dr. Quinn Axe at 6:07 pm on 09/22/2022 by text page via the [facility] messaging system. Electronically Signed   By: Logan Bores M.D.   On: 09/22/2022 18:07    Pending Labs Unresulted Labs (From admission, onward)     Start     Ordered   09/23/22 0500  Lipid panel  (Labs)  Tomorrow morning,   R       Comments: Fasting    09/22/22 1925   09/23/22 [id number]  Basic metabolic panel  Tomorrow morning,   R        09/22/22 2023   09/23/22 0500  CBC  Tomorrow morning,   R        09/22/22 2023   09/23/22 0500  Procalcitonin  Tomorrow morning,   R       References:    Procalcitonin Lower Respiratory Tract Infection AND Sepsis Procalcitonin Algorithm   09/22/22 2343   09/23/22 0500  Brain natriuretic peptide  Tomorrow morning,   R        09/22/22 2343   09/22/22 2023  Urinalysis, Routine w reflex microscopic -Urine, Clean Catch  Once,   R       Question:  Specimen Source  Answer:  Urine, Clean Catch   09/22/22 2023   09/22/22 1925  Hemoglobin A1c  (Labs)  Once,   URGENT        Comments: To assess prior glycemic control    09/22/22 1925            Vitals/Pain Today's Vitals   09/22/22 2358 09/23/22 0000 09/23/22 0100 09/23/22 0104  BP:  (!) 137/50 (!) 93/42 95/63  Pulse:  (!) 57 (!) 58 (!) 58  Resp:  '10 10 14  '$ Temp:    97.6 F (36.4 C)  TempSrc:    Oral  SpO2:  99% 97% 97%  Weight:      Height:      PainSc: 0-No pain       Isolation Precautions No active isolations  Medications Medications  sodium chloride flush (NS) 0.9 % injection 3 mL (3 mLs Intravenous Not Given 09/22/22 1923)   stroke: early stages of recovery book (has no administration in time range)  clopidogrel (PLAVIX) tablet 75 mg (75 mg Oral Given 09/22/22 1940)  acetaminophen (TYLENOL) tablet 650 mg (has no administration in time range)    Or  acetaminophen (TYLENOL) suppository 650 mg (has no administration in time range)  enoxaparin (LOVENOX) injection 40 mg (40 mg Subcutaneous Given 09/22/22 2100)  insulin aspart (novoLOG) injection 0-9 Units (  Subcutaneous Not Given 09/22/22 2328)  ipratropium-albuterol (DUONEB) 0.5-2.5 (3) MG/3ML nebulizer solution 3 mL (has no administration in time range)  iohexol (OMNIPAQUE) 350 MG/ML injection 75 mL (75 mLs Intravenous Contrast Given 09/22/22 1810)  naloxone (NARCAN) injection 1 mg (1 mg Intravenous Given 09/22/22 1753)  iohexol (OMNIPAQUE) 350 MG/ML injection 75 mL (75 mLs Intravenous Contrast Given 09/22/22 2206)    Mobility non-ambulatory     Focused Assessments Neuro Assessment Handoff:  Swallow screen pass? Yes    NIH Stroke Scale  Dizziness Present: No Headache Present: No Interval: Shift assessment Level of Consciousness (1a.)   : Alert, keenly responsive LOC Questions (1b. )   : Answers one question correctly LOC Commands (1c. )   : Performs both tasks correctly Best Gaze (2. )  : Normal Visual (3. )  : No visual loss Facial Palsy (4. )    : Minor paralysis Motor Arm, Left (5a. )   : No effort against gravity Motor  Arm, Right (5b. ) : No drift Motor Leg, Left (6a. )  : No effort against gravity Motor Leg, Right (6b. ) : No drift Limb Ataxia (7. ): Absent Sensory (8. )  : Normal, no sensory loss Best Language (9. )  : No aphasia Dysarthria (10. ): Normal Extinction/Inattention (11.)   : No Abnormality Complete NIHSS TOTAL: 8 Last date known well: 09/22/22 Last time known well: 1315 Neuro Assessment:   Neuro Checks:   Initial (09/22/22 1805)  Has TPA been given? No If patient is a Neuro Trauma and patient is going to OR before floor call report to [place] nurse: [phone] or [phone]   R Recommendations: See Admitting Provider Note  Report given to:   Additional Notes: NIH Q4, last 2 scored 8.

## 2022-09-23 NOTE — Evaluation (Signed)
Occupational Therapy Evaluation Patient Details Name: WENDA ENRIQUES MRN: [id number] DOB: 1946-02-13 Today's Date: 09/23/2022   History of Present Illness Pt is a 77yo female admitted with aphasia L facial droop and decreased alertness.  MRI: no acute finding.  Chronic small vessel disease and ventriculomegaly.  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 Pt is fairly dependent for adls outside of feeding self and occasionally assisting with bathing.  Pt has a caregiver that is with her at least 8 hours a day 7 days a week and then has family with her the remaining time. Pt has not walked in some time now and family assists with all transfers. Goals set for feeding and bathing as these are adls the pt does assist with.  Spoke at length with daughter and she is in agreement with this plan.        Recommendations for follow up therapy are one component of a multi-disciplinary discharge planning process, led by the attending physician.  Recommendations may be updated based on patient status, additional functional criteria and insurance authorization.   Follow Up Recommendations  No OT follow up     Assistance Recommended at Discharge Frequent or constant Supervision/Assistance  Patient can return home with the following A lot of help with walking and/or transfers;A lot of help with bathing/dressing/bathroom;Assistance with cooking/housework;Direct supervision/assist for financial management;Direct supervision/assist for medications management;Assist for transportation;Help with stairs or ramp for entrance    Functional Status Assessment  Patient has had a recent decline in their functional status and demonstrates the ability to make significant improvements in function in a reasonable and predictable amount of time.  Equipment Recommendations  None  recommended by OT    Recommendations for Other Services       Precautions / Restrictions Precautions Precautions: Fall Precaution Comments: Pt with long standing contracture of L knee adn L hand/wrist. Restrictions Weight Bearing Restrictions: No      Mobility Bed Mobility Overal bed mobility: Needs Assistance Bed Mobility: Supine to Sit     Supine to sit: Max assist     General bed mobility comments: pt assisted by moving R side but required assist to get trunck fully into sitting and to move L side.    Transfers Overall transfer level: Needs assistance Equipment used: 1 person hand held assist Transfers: Sit to/from Stand, Bed to chair/wheelchair/BSC Sit to Stand: Max assist   Squat pivot transfers: Max assist       General transfer comment: Daughter is used to transferring pt and demonstrated independence doing so.      Balance Overall balance assessment: Needs assistance Sitting-balance support: Feet supported Sitting balance-Leahy Scale: Fair     Standing balance support: Reliant on assistive device for balance, Bilateral upper extremity supported Standing balance-Leahy Scale: Poor Standing balance comment: pt heavily reliant on outside support to stand from person.  pt does not use walker as L side not functional.                           ADL either performed or assessed with clinical judgement   ADL Overall ADL's : Needs assistance/impaired Eating/Feeding: Minimal assistance;Sitting   Grooming: Wash/dry hands;Wash/dry face;Oral care;Applying deodorant;Brushing hair;Moderate assistance;Sitting  General ADL Comments: Pt dependent prior to admit with all toileting, grooming, dressing.     Vision Baseline Vision/History: 1 Wears glasses Ability to See in Adequate Light: 0 Adequate Patient Visual Report: No change from baseline Vision Assessment?: Yes Eye Alignment: Within Functional  Limits Ocular Range of Motion: Within Functional Limits;Other (comment) (slow and difficult to track but she can with cues) Alignment/Gaze Preference: Gaze right Tracking/Visual Pursuits: Decreased smoothness of horizontal tracking;Decreased smoothness of vertical tracking;Requires cues, head turns, or add eye shifts to track Visual Fields: No apparent deficits Additional Comments: Pt able to find items in room with cues, could read clock on wall.     Perception     Praxis      Pertinent Vitals/Pain Pain Assessment Pain Assessment: Faces Faces Pain Scale: Hurts little more Pain Location: L knee w ROM Pain Descriptors / Indicators: Grimacing, Aching Pain Intervention(s): Limited activity within patient's tolerance, Monitored during session, Repositioned     Hand Dominance Right   Extremity/Trunk Assessment Upper Extremity Assessment Upper Extremity Assessment: LUE deficits/detail LUE Deficits / Details: Did not complete ROM to LUE as arm is very contracted and painful with ROM. FAmily states it has been this way for some time.  Therapist able to get thumb out from palm easily.  No functional use of this arm. LUE: Unable to fully assess due to pain;Unable to fully assess due to immobilization;Shoulder pain with ROM LUE Sensation: decreased light touch LUE Coordination: decreased fine motor;decreased gross motor   Lower Extremity Assessment Lower Extremity Assessment: Defer to PT evaluation   Cervical / Trunk Assessment Cervical / Trunk Assessment: Kyphotic   Communication Communication Communication: Expressive difficulties   Cognition Arousal/Alertness: Awake/alert Behavior During Therapy: WFL for tasks assessed/performed Overall Cognitive Status: History of cognitive impairments - at baseline                                 General Comments: Pt impaired at baseline.  Difficult to assess if impaired more as pt does hae dementia and is talking slightly less but  did answer questions.     General Comments  Pt close to baseline with adls.    Exercises     Shoulder Instructions      Home Living Family/patient expects to be discharged to:: Private residence Living Arrangements: Children;Other relatives Available Help at Discharge: Family;Available 24 hours/day Type of Home: House Home Access: Level entry     Home Layout: One level     Bathroom Shower/Tub: Teacher, early years/pre: Standard     Home Equipment: BSC/3in1;Shower seat;Wheelchair - manual          Prior Functioning/Environment Prior Level of Function : Needs assist  Cognitive Assist : Mobility (cognitive);ADLs (cognitive) Mobility (Cognitive): Step by step cues ADLs (Cognitive): Step by step cues Physical Assist : Mobility (physical);ADLs (physical) Mobility (physical): Bed mobility;Transfers ADLs (physical): Grooming;Bathing;Dressing;Toileting;IADLs Mobility Comments: Pt no longer ambulates at all. Pt only does stand  pivot transfers with mod to total assist depending on the day. ADLs Comments: pt dependent for all adls outside of feeding and pt will assist caregiver with bathing at sink or in bed        OT Problem List: Decreased strength;Decreased range of motion;Impaired balance (sitting and/or standing);Impaired vision/perception;Decreased coordination;Decreased safety awareness;Decreased cognition;Decreased knowledge of use of DME or AE;Decreased knowledge of precautions;Impaired sensation;Impaired tone;Impaired UE functional use;Pain      OT Treatment/Interventions: Self-care/ADL training  OT Goals(Current goals can be found in the care plan section) Acute Rehab OT Goals Patient Stated Goal: none stated OT Goal Formulation: With patient/family Time For Goal Achievement: 10/07/22 Potential to Achieve Goals: Fair ADL Goals Pt Will Perform Eating: with set-up;sitting Pt Will Perform Upper Body Bathing: with min assist;sitting Pt Will Perform  Lower Body Bathing: with mod assist;sitting/lateral leans  OT Frequency: Min 2X/week    Co-evaluation              AM-PAC OT "6 Clicks" Daily Activity     Outcome Measure Help from another person eating meals?: A Little Help from another person taking care of personal grooming?: A Lot Help from another person toileting, which includes using toliet, bedpan, or urinal?: Total Help from another person bathing (including washing, rinsing, drying)?: A Lot Help from another person to put on and taking off regular upper body clothing?: Total Help from another person to put on and taking off regular lower body clothing?: Total 6 Click Score: 10   End of Session Equipment Utilized During Treatment: Oxygen Nurse Communication: Mobility status  Activity Tolerance: Patient tolerated treatment well Patient left: in chair;with call bell/phone within reach;with chair alarm set;with family/visitor present  OT Visit Diagnosis: Unsteadiness on feet (R26.81)                [id number] OT Time Calculation (min): 31 min Charges:  OT General Charges $OT Visit: 1 Visit OT Evaluation $OT Eval Moderate Complexity: 1 Mod OT Treatments $Self Care/Home Management : 8-22 mins  Glenford Peers 09/23/2022, 8:47 AM

## 2022-09-23 NOTE — TOC Initial Note (Signed)
Transition of Care [facility]) - Initial/Assessment Note    Patient Details  Name: Crystal Moore MRN: [id number] Date of Birth: Jul 30, 1945  Transition of Care [facility]) CM/SW Contact:    Pollie Friar, RN Phone Number: 09/23/2022, 2:56 PM  Clinical Narrative:                 Pt is from home alone but has family or caregivers with her 24/7.  Caregiver is there 7 days a week for 8 hours a day.  Family is with her the other times and through the night.  Family provides all needed transportation. DME at home: walker/ shower seat/ BSC/ wheelchair/ hospital bed Daughter is interested in a hoyer lift for home. CM has sent the referral to [employer] to be delivered to the home.  Pt's medications come prepackaged. Either the patients daughter or her caregiver provide the patient with her meds. The daughter denies any issues.  TOC following for further d/c needs.   Expected Discharge Plan: Home/Self Care Barriers to Discharge: Continued Medical Work up   Patient Goals and CMS Choice   CMS Medicare.gov Compare Post Acute Care list provided to:: Patient Represenative (must comment) Choice offered to / list presented to : Adult Children      Expected Discharge Plan and Services   Discharge Planning Services: CM Consult   Living arrangements for the past 2 months: [place]                 DME Arranged: Other see comment Harrel Lemon lift) DME Agency: [employer] Date DME Agency Contacted: 09/23/22   Representative spoke with at DME Agency: Maudie Mercury            Prior Living Arrangements/Services Living arrangements for the past 2 months: [place] with:: Self Patient language and need for interpreter reviewed:: Yes Do you feel safe going back to the place where you live?: Yes      Need for Family Participation in Patient Care: Yes (Comment) Care giver support system in place?: Yes (comment) Current home services: Homehealth aide Criminal Activity/Legal Involvement  Pertinent to Current Situation/Hospitalization: No - Comment as needed  Activities of Daily Living Home Assistive Devices/Equipment: Walker (specify type) ADL Screening (condition at time of admission) Patient's cognitive ability adequate to safely complete daily activities?: Yes Is the patient deaf or have difficulty hearing?: No Does the patient have difficulty seeing, even when wearing glasses/contacts?: No Does the patient have difficulty concentrating, remembering, or making decisions?: Yes Patient able to express need for assistance with ADLs?: Yes Does the patient have difficulty dressing or bathing?: Yes Independently performs ADLs?: No Communication: Independent Dressing (OT): Needs assistance Is this a change from baseline?: Pre-admission baseline Grooming: Needs assistance Is this a change from baseline?: Pre-admission baseline Feeding: Independent Bathing: Needs assistance Is this a change from baseline?: Pre-admission baseline Toileting: Needs assistance Is this a change from baseline?: Pre-admission baseline In/Out Bed: Needs assistance Is this a change from baseline?: Pre-admission baseline Walks in Home: Needs assistance Is this a change from baseline?: Pre-admission baseline Does the patient have difficulty walking or climbing stairs?: Yes Weakness of Legs: Left Weakness of Arms/Hands: Left  Permission Sought/Granted                  Emotional Assessment Appearance:: Appears stated age Attitude/Demeanor/Rapport:  (quiet) Affect (typically observed): Quiet Orientation: : Oriented to Self   Psych Involvement: No (comment)  Admission diagnosis:  Neurological symptoms [R29.90] Cerebral infarction, unspecified mechanism ([place]) [  I63.9] Patient Active Problem List   Diagnosis Date Noted   Neurological symptoms 09/22/2022   Hypoxemia 09/22/2022   Acute respiratory failure with hypoxia (HCC)    Diabetic oculopathy ([place]) 11/14/2020   Hemiplegia of  nondominant side as late effect of cerebrovascular disease ([place]) 11/14/2020   Hyperglycemia due to type 2 diabetes mellitus ([place]) 11/14/2020   Long term (current) use of insulin ([place]) 11/14/2020   Memory loss 11/14/2020   Nicotine dependence, unspecified, uncomplicated 0000000   Non-pressure chronic ulcer of other part of left foot with unspecified severity ([place]) 11/14/2020   Residual cognitive deficit as late effect of cerebrovascular accident 11/14/2020   Pressure injury of skin 09/16/2020   Acute blood loss anemia    Gastric AVM    Acute upper GI bleed 09/14/2020   Symptomatic anemia 09/14/2020   PVD (peripheral vascular disease) ([place]) 09/14/2020   Cellulitis of great toe of right foot 08/26/2020   CVA (cerebral vascular accident) ([place])    COVID-19 virus infection 07/2020   Ankle ulcer ([place]) 06/28/2020   Closed fracture of neck of left femur ([place]) 08/11/2019   COVID-19 virus detected 08/11/2019   Dementia ([place]) 08/11/2019   AKI (acute kidney injury) ([place]) 01/15/2019   Cerebral thrombosis with cerebral infarction 01/15/2019   TIA (transient ischemic attack) 01/13/2019   Insulin dependent type 2 diabetes mellitus ([place]) 01/13/2019   Hypokalemia 12/18/2018   Acute CVA (cerebrovascular accident) ([place]) 12/17/2018   Malnutrition of moderate degree 05/31/2018   Hematochezia 05/29/2018   Ischemic colitis ([place]) 05/29/2018   Acute encephalopathy 05/29/2018   Type 2 diabetes mellitus ([place]) 05/29/2018   Hypertension 05/29/2018   Depression 05/29/2018   COPD (chronic obstructive pulmonary disease) ([place]) 05/29/2018   Sepsis ([place]) 05/28/2018   Diabetes ([place]) 03/16/2018   ILD (interstitial lung disease) ([place]) 02/18/2018   Chronic respiratory failure with hypoxia ([place]) 02/18/2018   COPD (chronic obstructive pulmonary disease) ([place]) 01/12/2018   Tobacco abuse 01/12/2018   Arthritis 07/29/2017   Asthma 07/29/2017   Gastroesophageal reflux disease 07/29/2017   Hyperlipidemia 07/29/2017    Left hemiplegia ([place]) 07/29/2017   Sciatica 07/29/2017   Vitamin D deficiency 07/29/2017   PCP:  [name], MD Pharmacy:  No Pharmacies Listed    Social Determinants of Health (SDOH) Social History: SDOH Screenings   Food Insecurity: No Food Insecurity (09/23/2022)  Housing: Low Risk  (09/23/2022)  Transportation Needs: No Transportation Needs (09/23/2022)  Utilities: Not At Risk (09/23/2022)  Tobacco Use: High Risk (09/23/2022)   SDOH Interventions: Housing Interventions: Intervention Not Indicated   Readmission Risk Interventions    09/18/2020   12:44 PM  Readmission Risk Prevention Plan  Transportation Screening Complete  PCP or Specialist Appt within 3-5 Days Complete  Social Work Consult for [place] Planning/Counseling Complete  Palliative Care Screening Not Applicable  Medication Review Press photographer) Complete

## 2022-09-23 NOTE — Plan of Care (Signed)
  Problem: Skin Integrity: Goal: Risk for impaired skin integrity will decrease Outcome: Progressing   Problem: Coping: Goal: Level of anxiety will decrease Outcome: Progressing

## 2022-09-23 NOTE — Plan of Care (Signed)
  Problem: Education: Goal: Knowledge of disease or condition will improve Outcome: Progressing Goal: Knowledge of secondary prevention will improve (MUST DOCUMENT ALL) Outcome: Progressing Goal: Knowledge of patient specific risk factors will improve Crystal Moore N/A or DELETE if not current risk factor) Outcome: Progressing   Problem: Ischemic Stroke/TIA Tissue Perfusion: Goal: Complications of ischemic stroke/TIA will be minimized Outcome: Progressing   Problem: Coping: Goal: Will verbalize positive feelings about self Outcome: Progressing Goal: Will identify appropriate support needs Outcome: Progressing   Problem: Health Behavior/Discharge Planning: Goal: Ability to manage health-related needs will improve Outcome: Progressing Goal: Goals will be collaboratively established with patient/family Outcome: Progressing   Problem: Self-Care: Goal: Ability to participate in self-care as condition permits will improve Outcome: Progressing Goal: Verbalization of feelings and concerns over difficulty with self-care will improve Outcome: Progressing Goal: Ability to communicate needs accurately will improve Outcome: Progressing   Problem: Nutrition: Goal: Risk of aspiration will decrease Outcome: Progressing Goal: Dietary intake will improve Outcome: Progressing   Problem: Education: Goal: Ability to describe self-care measures that may prevent or decrease complications (Diabetes Survival Skills Education) will improve Outcome: Progressing Goal: Individualized Educational Video(s) Outcome: Progressing   Problem: Coping: Goal: Ability to adjust to condition or change in health will improve Outcome: Progressing   Problem: Fluid Volume: Goal: Ability to maintain a balanced intake and output will improve Outcome: Progressing   Problem: Health Behavior/Discharge Planning: Goal: Ability to identify and utilize available resources and services will improve Outcome:  Progressing Goal: Ability to manage health-related needs will improve Outcome: Progressing   Problem: Metabolic: Goal: Ability to maintain appropriate glucose levels will improve Outcome: Progressing   Problem: Nutritional: Goal: Maintenance of adequate nutrition will improve Outcome: Progressing Goal: Progress toward achieving an optimal weight will improve Outcome: Progressing   Problem: Skin Integrity: Goal: Risk for impaired skin integrity will decrease Outcome: Progressing   Problem: Tissue Perfusion: Goal: Adequacy of tissue perfusion will improve Outcome: Progressing   Problem: Education: Goal: Knowledge of General Education information will improve Description: Including pain rating scale, medication(s)/side effects and non-pharmacologic comfort measures Outcome: Progressing   Problem: Health Behavior/Discharge Planning: Goal: Ability to manage health-related needs will improve Outcome: Progressing   Problem: Clinical Measurements: Goal: Ability to maintain clinical measurements within normal limits will improve Outcome: Progressing Goal: Will remain free from infection Outcome: Progressing Goal: Diagnostic test results will improve Outcome: Progressing Goal: Respiratory complications will improve Outcome: Progressing Goal: Cardiovascular complication will be avoided Outcome: Progressing   Problem: Activity: Goal: Risk for activity intolerance will decrease Outcome: Progressing   Problem: Nutrition: Goal: Adequate nutrition will be maintained Outcome: Progressing   Problem: Coping: Goal: Level of anxiety will decrease Outcome: Progressing   Problem: Elimination: Goal: Will not experience complications related to bowel motility Outcome: Progressing Goal: Will not experience complications related to urinary retention Outcome: Progressing   Problem: Pain Managment: Goal: General experience of comfort will improve Outcome: Progressing   Problem:  Safety: Goal: Ability to remain free from injury will improve Outcome: Progressing   Problem: Skin Integrity: Goal: Risk for impaired skin integrity will decrease Outcome: Progressing

## 2022-09-23 NOTE — Evaluation (Signed)
Physical Therapy Evaluation Patient Details Name: Crystal Moore MRN: [id number] DOB: 06-Apr-1946 Today's Date: 09/23/2022  History of Present Illness  Pt is a 77 y.o. female who presented 09/22/22 with aphasia and confusion. Outside window for TNK. MRI negative for acute intracranial abnormality. PMH: multiple prior strokes with residual left-sided weakness, COPD, diabetes, seizures, tobacco use disorder, dementia   Clinical Impression  Pt presents with condition above and deficits mentioned below, see PT Problem List. PTA, she was heavily reliant on her caregiver to assist with stand pivot transfers, needing mod-TA depending on the day. She is non-ambulatory at baseline. Pt lives with her family in a 1-level house with a level entry. Currently, pt demonstrates deficits in overall strength, balance, and activity tolerance along with L hip extension, L knee extension, and L ankle dorsiflexion ROM (which appears to be close to baseline). Her limitations in L lower extremity ROM limit her from being able to stand fully upright. She required maxA to squat pivot and for bed mobility this date. As pt is functioning close to her baseline, no follow-up PT recommended at this time. However, will plan to continue to follow acutely to maximize her independence and reduce caregiver burden along with educate caregiver as needed while pt is here in the hospital.      Recommendations for follow up therapy are one component of a multi-disciplinary discharge planning process, led by the attending physician.  Recommendations may be updated based on patient status, additional functional criteria and insurance authorization.  Follow Up Recommendations No PT follow up      Assistance Recommended at Discharge Frequent or constant Supervision/Assistance  Patient can return home with the following  Assistance with cooking/housework;Direct supervision/assist for medications management;Direct supervision/assist for  financial management;Assist for transportation;A lot of help with walking and/or transfers;A lot of help with bathing/dressing/bathroom    Equipment Recommendations Other (comment) (hoyer lift)  Recommendations for Other Services       Functional Status Assessment Patient has had a recent decline in their functional status and demonstrates the ability to make significant improvements in function in a reasonable and predictable amount of time.     Precautions / Restrictions Precautions Precautions: Fall Precaution Comments: Pt with long standing contracture of L knee and L hand/wrist. Restrictions Weight Bearing Restrictions: No      Mobility  Bed Mobility Overal bed mobility: Needs Assistance Bed Mobility: Sit to Supine       Sit to supine: Max assist   General bed mobility comments: Pt assisting in descending trunk and trying to lift legs, but needed maxA to lift legs and manage pt to midline of bed from sitting EOB    Transfers Overall transfer level: Needs assistance Equipment used: 1 person hand held assist Transfers: Sit to/from Stand, Bed to chair/wheelchair/BSC Sit to Stand: Max assist     Squat pivot transfers: Max assist     General transfer comment: MaxA with pt holding onto therapist with face to face approach to stand and squat pivot to the R recliner > bed. Pt unable to fully extend hips to stand all the way upright.    Ambulation/Gait               General Gait Details: unable at baseline  Stairs            Wheelchair Mobility    Modified Rankin (Stroke Patients Only) Modified Rankin (Stroke Patients Only) Pre-Morbid Rankin Score: Severe disability Modified Rankin: Severe disability     Balance Overall  balance assessment: Needs assistance Sitting-balance support: Feet supported Sitting balance-Leahy Scale: Fair Sitting balance - Comments: static sitting EOB with supervision for safety   Standing balance support: Reliant on  assistive device for balance, Bilateral upper extremity supported Standing balance-Leahy Scale: Poor Standing balance comment: pt heavily reliant on outside support to stand from person.  pt does not use walker as L side not functional.                             Pertinent Vitals/Pain Pain Assessment Pain Assessment: Faces Faces Pain Scale: Hurts even more Pain Location: L knee and hip with ROM Pain Descriptors / Indicators: Grimacing, Guarding Pain Intervention(s): Limited activity within patient's tolerance, Monitored during session, Repositioned    Home Living Family/patient expects to be discharged to:: Private residence Living Arrangements: Children;Other relatives Available Help at Discharge: Family;Available 24 hours/day Type of Home: House Home Access: Level entry       Home Layout: One level Home Equipment: BSC/3in1;Shower seat;Wheelchair - manual;Hospital bed      Prior Function Prior Level of Function : Needs assist  Cognitive Assist : Mobility (cognitive);ADLs (cognitive) Mobility (Cognitive): Step by step cues ADLs (Cognitive): Step by step cues Physical Assist : Mobility (physical);ADLs (physical) Mobility (physical): Bed mobility;Transfers ADLs (physical): Grooming;Bathing;Dressing;Toileting;IADLs Mobility Comments: Pt no longer ambulates at all. Pt only does stand  pivot transfers with mod to total assist depending on the day. ADLs Comments: pt dependent for all adls outside of feeding and pt will assist caregiver with bathing at sink or in bed     Hand Dominance   Dominant Hand: Right    Extremity/Trunk Assessment   Upper Extremity Assessment Upper Extremity Assessment: Defer to OT evaluation    Lower Extremity Assessment Lower Extremity Assessment: LLE deficits/detail;Generalized weakness LLE Deficits / Details: able to achieve full knee extension PROM with extended stretch, limited ankle dorsiflexion ROM, unable to get hip to neutral  due to pain with trying to stretch into extension, pt demonstrates about -45 degrees of L hip extension PROM    Cervical / Trunk Assessment Cervical / Trunk Assessment: Kyphotic  Communication   Communication: Expressive difficulties  Cognition Arousal/Alertness: Awake/alert Behavior During Therapy: WFL for tasks assessed/performed Overall Cognitive Status: History of cognitive impairments - at baseline                                 General Comments: Pt impaired at baseline.  Difficult to assess if impaired more as pt does have dementia and is talking slightly less but did answer questions.        General Comments General comments (skin integrity, edema, etc.): encouraged more A/PROM of legs    Exercises Other Exercises Other Exercises: passive stretch to L hip into extension 3x ~10 sec supine Other Exercises: AAROM into knee flexion and extension 5x bil, reclined in chair Other Exercises: PROM into ankle dorsiflexion 5x bil, reclined in chair   Assessment/Plan    PT Assessment Patient needs continued PT services  PT Problem List Decreased strength;Decreased range of motion;Decreased activity tolerance;Decreased balance;Decreased mobility       PT Treatment Interventions DME instruction;Functional mobility training;Therapeutic activities;Therapeutic exercise;Balance training;Neuromuscular re-education;Patient/family education;Wheelchair mobility training;Cognitive remediation    PT Goals (Current goals can be found in the Care Plan section)  Acute Rehab PT Goals Patient Stated Goal: did not state PT Goal Formulation: With patient/family Time For  Goal Achievement: 10/07/22 Potential to Achieve Goals: Fair    Frequency Min 2X/week     Co-evaluation               AM-PAC PT "6 Clicks" Mobility  Outcome Measure Help needed turning from your back to your side while in a flat bed without using bedrails?: A Lot Help needed moving from lying on your back  to sitting on the side of a flat bed without using bedrails?: A Lot Help needed moving to and from a bed to a chair (including a wheelchair)?: A Lot Help needed standing up from a chair using your arms (e.g., wheelchair or bedside chair)?: A Lot Help needed to walk in hospital room?: Total Help needed climbing 3-5 steps with a railing? : Total 6 Click Score: 10    End of Session Equipment Utilized During Treatment: Gait belt Activity Tolerance: Patient tolerated treatment well Patient left: in bed;with call bell/phone within reach;with bed alarm set;with family/visitor present   PT Visit Diagnosis: Unsteadiness on feet (R26.81);Muscle weakness (generalized) (M62.81)    Time: [id number] PT Time Calculation (min) (ACUTE ONLY): 20 min   Charges:   PT Evaluation $PT Eval Moderate Complexity: 1 Mod          Moishe Spice, PT, DPT Acute Rehabilitation Services  Office: [phone]   Orvan Falconer 09/23/2022, 2:03 PM

## 2022-09-24 ENCOUNTER — Inpatient Hospital Stay (HOSPITAL_COMMUNITY): Payer: 59

## 2022-09-24 ENCOUNTER — Telehealth: Payer: Self-pay | Admitting: Pulmonary Disease

## 2022-09-24 DIAGNOSIS — D509 Iron deficiency anemia, unspecified: Secondary | ICD-10-CM

## 2022-09-24 DIAGNOSIS — E538 Deficiency of other specified B group vitamins: Secondary | ICD-10-CM | POA: Diagnosis not present

## 2022-09-24 DIAGNOSIS — R918 Other nonspecific abnormal finding of lung field: Secondary | ICD-10-CM | POA: Diagnosis not present

## 2022-09-24 DIAGNOSIS — Z7189 Other specified counseling: Secondary | ICD-10-CM

## 2022-09-24 DIAGNOSIS — Z515 Encounter for palliative care: Secondary | ICD-10-CM

## 2022-09-24 DIAGNOSIS — C3490 Malignant neoplasm of unspecified part of unspecified bronchus or lung: Secondary | ICD-10-CM

## 2022-09-24 LAB — GLUCOSE, CAPILLARY
Glucose-Capillary: 102 mg/dL — ABNORMAL HIGH (ref 70–99)
Glucose-Capillary: 133 mg/dL — ABNORMAL HIGH (ref 70–99)
Glucose-Capillary: 157 mg/dL — ABNORMAL HIGH (ref 70–99)
Glucose-Capillary: 183 mg/dL — ABNORMAL HIGH (ref 70–99)
Glucose-Capillary: 258 mg/dL — ABNORMAL HIGH (ref 70–99)
Glucose-Capillary: 96 mg/dL (ref 70–99)

## 2022-09-24 LAB — RETICULOCYTES
Immature Retic Fract: 25.6 % — ABNORMAL HIGH (ref 2.3–15.9)
RBC.: 4.03 MIL/uL (ref 3.87–5.11)
Retic Count, Absolute: 65.7 10*3/uL (ref 19.0–186.0)
Retic Ct Pct: 1.6 % (ref 0.4–3.1)

## 2022-09-24 LAB — BASIC METABOLIC PANEL WITH GFR
Anion gap: 8 (ref 5–15)
BUN: 20 mg/dL (ref 8–23)
CO2: 22 mmol/L (ref 22–32)
Calcium: 8.6 mg/dL — ABNORMAL LOW (ref 8.9–10.3)
Chloride: 107 mmol/L (ref 98–111)
Creatinine, Ser: 0.96 mg/dL (ref 0.44–1.00)
GFR, Estimated: >60 mL/min
Glucose, Bld: 115 mg/dL — ABNORMAL HIGH (ref 70–99)
Potassium: 4.1 mmol/L (ref 3.5–5.1)
Sodium: 137 mmol/L (ref 135–145)

## 2022-09-24 LAB — HEMOGLOBIN A1C
Hgb A1c MFr Bld: 7.4 % — ABNORMAL HIGH (ref 4.8–5.6)
Mean Plasma Glucose: 166 mg/dL

## 2022-09-24 LAB — IRON AND TIBC
Iron: 28 ug/dL (ref 28–170)
Saturation Ratios: 9 % — ABNORMAL LOW (ref 10.4–31.8)
TIBC: 307 ug/dL (ref 250–450)
UIBC: 279 ug/dL

## 2022-09-24 LAB — VITAMIN B12: Vitamin B-12: 527 pg/mL (ref 180–914)

## 2022-09-24 LAB — FERRITIN: Ferritin: 11 ng/mL (ref 11–307)

## 2022-09-24 LAB — CBC
HCT: 28.4 % — ABNORMAL LOW (ref 36.0–46.0)
Hemoglobin: 9 g/dL — ABNORMAL LOW (ref 12.0–15.0)
MCH: 22.2 pg — ABNORMAL LOW (ref 26.0–34.0)
MCHC: 31.7 g/dL (ref 30.0–36.0)
MCV: 70.1 fL — ABNORMAL LOW (ref 80.0–100.0)
Platelets: 282 10*3/uL (ref 150–400)
RBC: 4.05 MIL/uL (ref 3.87–5.11)
RDW: 20.3 % — ABNORMAL HIGH (ref 11.5–15.5)
WBC: 8.5 10*3/uL (ref 4.0–10.5)
nRBC: 0 % (ref 0.0–0.2)

## 2022-09-24 LAB — FOLATE: Folate: 5.1 ng/mL — ABNORMAL LOW (ref >5.9)

## 2022-09-24 MED ORDER — MORPHINE SULFATE (PF) 2 MG/ML IV SOLN
2.0000 mg | INTRAVENOUS | Status: DC | PRN
  Administered 2022-09-24: 2 mg via INTRAVENOUS
  Filled 2022-09-24: qty 1

## 2022-09-24 MED ORDER — SODIUM CHLORIDE 0.9 % IV SOLN
250.0000 mg | Freq: Every day | INTRAVENOUS | Status: AC
  Administered 2022-09-24 – 2022-09-25 (×2): 250 mg via INTRAVENOUS
  Filled 2022-09-24 (×2): qty 20

## 2022-09-24 MED ORDER — FERROUS SULFATE 325 (65 FE) MG PO TABS: 325.0000 mg | ORAL_TABLET | Freq: Every day | ORAL | Status: DC

## 2022-09-24 MED ORDER — FOLIC ACID 1 MG PO TABS
1.0000 mg | ORAL_TABLET | Freq: Every day | ORAL | Status: DC
  Administered 2022-09-24 – 2022-09-25 (×2): 1 mg via ORAL
  Filled 2022-09-24 (×2): qty 1

## 2022-09-24 NOTE — Telephone Encounter (Signed)
Please arrange hospital f/u in 2-3 weeks with APP or Crystal Moore.  Needs outpatient PET/CT as well for lung mass.

## 2022-09-24 NOTE — Consult Note (Signed)
Consultation Note Date: 09/24/2022   Patient Name: Crystal Moore  DOB: Nov 01, 1945  MRN: [id number]  Age / Sex: 77 y.o., female  PCP: [name], MD Referring Physician: Bonnielee Haff, MD  Reason for Consultation: Establishing goals of care  HPI/Patient Profile: 77 y.o. female  with past medical history of COPD, multiple strokes with left-sided weakness, dementia seizures, diabetes, PVD admitted on 09/22/2022 with aphasia and not acting like herself due to possible TIA vs seizure. No evidence of new acute stroke. Incidental findings of new bilateral lung masses concerning for metastatic disease  Clinical Assessment and Goals of Care: Consult received and extensive chart review completed. I met with Crystal Moore but no family currently at bedside. I spoke with RN who reports that family has been typically at bedside and there are multiple supportive family members visiting with Crystal Moore. Crystal Moore is lying in bed. She answers my questions mostly yes or no answers. When asked where she is she tells me "Crystal Moore's house." I reminded her that she is in the hospital but Crystal Moore knows that she is here and I will give Crystal Moore a call - she agrees. I left her to rest.   I called and spoke with daughter, Crystal Moore. Crystal Moore and I discussed her mother's current condition and diagnosis. Crystal Moore reports that her mother is eating very well and doing overall well. She lives with Crystal Moore and there are multiple other supportive family members at home. Crystal Moore and I discuss goals of care. She shares that she knows that her mother cannot tolerate chemotherapy. However, she does desire full work up to know what they are dealing with, what to expect, and she does wish to know options for potential treatment. Crystal Moore is aware that her mother is too frail to tolerate aggressive cancer treatment. Crystal Moore shares that they  have faith and trust that her mother will go when God is ready for her and not before. Crystal Moore is open to further conversation but not prepared to make any decisions until they have more information. She wishes for further follow up with pulmonology. She is open to outpatient palliative referral. They are hoping that Crystal Moore can return home to celebrate her birthday at home with her family if possible. I left Crystal Moore booklet at bedside.   All questions/concerns addressed. Emotional support provided.   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     SUMMARY OF RECOMMENDATIONS   - Will need ongoing goals of care conversation likely outpatient as well  Code Status/Advance Care Planning: Full code - needs further discussion   Symptom Management:  Per attending, pulmonology  Prognosis:  Overall prognosis poor.   Discharge Planning: Home with Palliative Services      Primary Diagnoses: Present on Admission:  Neurological symptoms  COPD (chronic obstructive pulmonary disease) (HCC)  Dementia ([place])   I have reviewed the medical record, interviewed the patient and family, and examined the patient. The following aspects are pertinent.  Past Medical History:  Diagnosis Date   Arthritis    COPD (chronic obstructive pulmonary disease) ([place])    COVID-19 virus infection 07/2020   CVA (cerebral vascular accident) ([place])    Dementia ([place])    Diabetes mellitus without complication ([place])    Hemiparesis affecting left side as late effect of cerebrovascular accident (CVA) ([place])    Pyelonephritis    Seizures ([place])    Stroke ([place])    Tobacco use disorder    UTI (urinary tract infection)    Social History   Socioeconomic History   Marital status: Single    Spouse name: Not on file   Number of children: Not on file   Years of education: Not on file   Highest education level: Not on file   Occupational History   Not on file  Tobacco Use   Smoking status: Every Day    Packs/day: 1.00    Years: 40.00    Total pack years: 40.00    Types: Cigarettes   Smokeless tobacco: Never  Vaping Use   Vaping Use: Never used  Substance and Sexual Activity   Alcohol use: Never   Drug use: Never   Sexual activity: Not on file  Other Topics Concern   Not on file  Social History Narrative   ** Merged History Encounter **   Right handed    Lives with family        Social Determinants of Health   Financial Resource Strain: Not on file  Food Insecurity: No Food Insecurity (09/23/2022)   Hunger Vital Sign    Worried About Running Out of Food in the Last Year: Never true    Ran Out of Food in the Last Year: Never true  Transportation Needs: No Transportation Needs (09/23/2022)   PRAPARE - Transportation    Lack of Transportation (Medical): No    Lack of Transportation (Non-Medical): No  Physical Activity: Not on file  Stress: Not on file  Social Connections: Not on file   Family History  Problem Relation Age of Onset   Diabetes Mother    Diabetes Sister    Diabetes Brother    Scheduled Meds:  clopidogrel  75 mg Oral Daily   enoxaparin (LOVENOX) injection  40 mg Subcutaneous Q24H   [START ON 09/26/2022] ferrous sulfate  325 mg Oral Q breakfast   folic acid  1 mg Oral Daily   insulin aspart  0-15 Units Subcutaneous TID WC   sodium chloride flush  3 mL Intravenous Once   Continuous Infusions:  ferric gluconate (FERRLECIT) IVPB 250 mg (09/24/22 1245)   PRN Meds:.acetaminophen **OR** acetaminophen, ipratropium-albuterol, morphine injection Allergies  Allergen Reactions   Aspirin     Can not take due to previous GI bleed - per daughter   Review of Systems  Unable to perform ROS: Dementia    Physical Exam Vitals and nursing note reviewed.  Constitutional:      General: She is not in acute distress.    Appearance: She is ill-appearing.  Cardiovascular:     Rate and  Rhythm: Normal rate.  Pulmonary:     Effort: No tachypnea, accessory muscle usage or respiratory distress.  Abdominal:     Palpations: Abdomen is soft.  Neurological:     Mental Status: She is alert.     Comments: Oriented to person only; recognizes family; able to follow simple commands and answer simple questions     Vital Signs: BP (!) 123/50 (BP Location: Right Arm)  Pulse 64   Temp 98.6 F (37 C) (Oral)   Resp 18   Ht 5' 2.01" (1.575 m)   Wt 65.1 kg   SpO2 99%   BMI 26.24 kg/m  Pain Scale: 0-10   Pain Score: Asleep   SpO2: SpO2: 99 % O2 Device:SpO2: 99 % O2 Flow Rate: .O2 Flow Rate (L/min): 2 L/min  IO: Intake/output summary:  Intake/Output Summary (Last 24 hours) at 09/24/2022 1356 Last data filed at 09/24/2022 0900 Gross per 24 hour  Intake 320 ml  Output 500 ml  Net -180 ml    LBM: Last BM Date : 09/22/22 Baseline Weight: Weight: 67 kg Most recent weight: Weight: 65.1 kg     Palliative Assessment/Data:     Time In: 1500  Time Total: 75 min  Greater than 50%  of this time was spent counseling and coordinating care related to the above assessment and plan.  Signed by: Vinie Sill, NP Palliative Medicine Team Pager # [phone] (M-F 8a-5p) Team Phone # [phone] (Nights/Weekends)

## 2022-09-24 NOTE — Progress Notes (Signed)
EEG complete - results pending 

## 2022-09-24 NOTE — Progress Notes (Addendum)
STROKE TEAM PROGRESS NOTE   INTERVAL HISTORY Her niece is at the bedside.  Patient is laying in bed awake and alert in NAD. Niece states she feels like patient is at her baseline. She can tell us her name, has right facial droop, left side weakness from prior stroke  MRI brain with no acute process. Consider a palliative care consult for [place] discussions  Will check routine EEG to evaluate for seizures   Vitals:   09/24/22 0339 09/24/22 0400 09/24/22 0817 09/24/22 1214  BP: 116/68  (!) 152/62 (!) 123/50  Pulse: 70  63 64  Resp: '17  18 18  '$ Temp: 99.1 F (37.3 C)  98.4 F (36.9 C) 98.6 F (37 C)  TempSrc: Oral  Oral Oral  SpO2: 99%  99%   Weight:  65.1 kg    Height:       CBC:  Recent Labs  Lab 09/22/22 1750 09/22/22 1755 09/23/22 0400 09/24/22 0503  WBC 7.7  --  8.3 8.5  NEUTROABS 4.5  --   --   --   HGB 10.2*   < > 9.7* 9.0*  HCT 32.7*   < > 31.9* 28.4*  MCV 72.3*  --  71.8* 70.1*  PLT 294  --  198 282   < > = values in this interval not displayed.   Basic Metabolic Panel:  Recent Labs  Lab 09/23/22 0400 09/24/22 0503  NA 138 137  K 4.6 4.1  CL 107 107  CO2 22 22  GLUCOSE 82 115*  BUN 16 20  CREATININE 0.82 0.96  CALCIUM 8.6* 8.6*   Lipid Panel:  Recent Labs  Lab 09/23/22 0400  CHOL 79  TRIG 67  HDL 27*  CHOLHDL 2.9  VLDL 13  LDLCALC 39   HgbA1c:  Recent Labs  Lab 09/22/22 1750  HGBA1C 7.4*   Urine Drug Screen: No results for input(s): "LABOPIA", "COCAINSCRNUR", "LABBENZ", "AMPHETMU", "THCU", "LABBARB" in the last 168 hours.  Alcohol Level  Recent Labs  Lab 09/22/22 1750  ETH <10    IMAGING past 24 hours CT ABDOMEN PELVIS W CONTRAST  Result Date: 09/23/2022 CLINICAL DATA:  Metastatic disease evaluation, lung masses identified by prior CT chest * Tracking Code: BO * EXAM: CT ABDOMEN AND PELVIS WITH CONTRAST TECHNIQUE: Multidetector CT imaging of the abdomen and pelvis was performed using the standard protocol following bolus administration  of intravenous contrast. RADIATION DOSE REDUCTION: This exam was performed according to the departmental dose-optimization program which includes automated exposure control, adjustment of the mA and/or kV according to patient size and/or use of iterative reconstruction technique. CONTRAST:  52m OMNIPAQUE IOHEXOL 350 MG/ML SOLN COMPARISON:  CT chest angiogram, 09/22/2022 FINDINGS: Lower chest: No acute abnormality of the included lung bases. Coronary artery calcifications. Scarring and or atelectasis of the lung bases. Hepatobiliary: No solid liver abnormality is seen. Sludge or excreted contrast in the gallbladder. No discretely visualized gallstones, gallbladder wall thickening, or biliary dilatation. Pancreas: Unremarkable. No pancreatic ductal dilatation or surrounding inflammatory changes. Spleen: Normal in size without significant abnormality. Adrenals/Urinary Tract: Adrenal glands are unremarkable. Simple, benign right renal cortical cyst, for which no further follow-up characterization is required. Kidneys are otherwise normal, without renal calculi, solid lesion, or hydronephrosis. Bladder is unremarkable. Stomach/Bowel: Stomach is within normal limits. Appendix is not clearly visualized and may be surgically absent. No evidence of bowel wall thickening, distention, or inflammatory changes. Descending and sigmoid diverticulosis. Vascular/Lymphatic: Aortic atherosclerosis. No enlarged abdominal or pelvic lymph nodes. Reproductive: Status  post hysterectomy. Other: No abdominal wall hernia or abnormality. No ascites. Musculoskeletal: No acute or significant osseous findings. Age indeterminate wedge deformity of L1. IMPRESSION: 1. No evidence of mass or lymphadenopathy in the abdomen or pelvis. 2. Descending and sigmoid diverticulosis without evidence of acute diverticulitis. 3. Age indeterminate wedge deformity of L1. Correlate for acute pain. 4. Coronary artery disease. Aortic Atherosclerosis (ICD10-I70.0).  Electronically Signed   By: Delanna Ahmadi M.D.   On: 09/23/2022 18:11   ECHOCARDIOGRAM COMPLETE  Result Date: 09/23/2022    ECHOCARDIOGRAM REPORT   Patient Name:   ANNACLAIRE PERRIER [facility] Date of Exam: 09/23/2022 Medical Rec #:  [id number]       Height:       62.0 in Accession #:    [id number]      Weight:       147.7 lb Date of Birth:  1945-10-05        BSA:          1.681 m Patient Age:    77 years        BP:           130/40 mmHg Patient Gender: F               HR:           79 bpm. Exam Location:  Inpatient Procedure: 2D Echo, Cardiac Doppler and Color Doppler Indications:    Stroke I63.9  History:        Patient has prior history of Echocardiogram examinations, most                 recent 06/28/2021. Stroke, TIA and COPD; Risk                 Factors:Hypertension, Diabetes, Dyslipidemia and Current Smoker.  Sonographer:    Ronny Flurry Referring Phys: [id number] VASUNDHRA RATHORE  Sonographer Comments: Image acquisition challenging due to uncooperative patient. IMPRESSIONS  1. Left ventricular ejection fraction, by estimation, is 65 to 70%. The left ventricle has normal function. The left ventricle has no regional wall motion abnormalities. Left ventricular diastolic function could not be evaluated.  2. Right ventricular systolic function is normal. The right ventricular size is normal.  3. The mitral valve is normal in structure. No evidence of mitral valve regurgitation. No evidence of mitral stenosis.  4. The aortic valve is tricuspid. Aortic valve regurgitation is not visualized.  5. Limited echo as only parasternal images obtained due to patient's combativeness. If more information needed, consider repeat echo when patient able to be more comlaint with exam. FINDINGS  Left Ventricle: Left ventricular ejection fraction, by estimation, is 65 to 70%. The left ventricle has normal function. The left ventricle has no regional wall motion abnormalities. The left ventricular internal cavity size was normal in size.  There is  no left ventricular hypertrophy. Left ventricular diastolic function could not be evaluated. Right Ventricle: The right ventricular size is normal. No increase in right ventricular wall thickness. Right ventricular systolic function is normal. Left Atrium: Left atrial size was normal in size. Right Atrium: Right atrial size was not assessed. Pericardium: There is no evidence of pericardial effusion. Mitral Valve: The mitral valve is normal in structure. No evidence of mitral valve regurgitation. No evidence of mitral valve stenosis. Tricuspid Valve: The tricuspid valve is not well visualized. Tricuspid valve regurgitation is trivial. No evidence of tricuspid stenosis. Aortic Valve: The aortic valve is tricuspid. Aortic valve regurgitation is not visualized. Pulmonic Valve: The pulmonic valve  was not assessed. Pulmonic valve regurgitation is not visualized. No evidence of pulmonic stenosis. Aorta: Aortic root could not be assessed. Venous: The inferior vena cava was not well visualized. IAS/Shunts: No atrial level shunt detected by color flow Doppler.  LEFT VENTRICLE PLAX 2D LVIDd:         3.90 cm LV PW:         1.10 cm LV IVS:        1.20 cm LVOT diam:     2.00 cm LVOT Area:     3.14 cm   AORTA Ao Root diam: 2.80 cm Ao Asc diam:  2.80 cm  SHUNTS Systemic Diam: 2.00 cm Glori Bickers MD Electronically signed by Glori Bickers MD Signature Date/Time: 09/23/2022/4:41:32 PM    Final     PHYSICAL EXAM  Temp:  [97.7 F (36.5 C)-99.5 F (37.5 C)] 98.6 F (37 C) (02/29 1214) Pulse Rate:  [63-94] 64 (02/29 1214) Resp:  [16-18] 18 (02/29 1214) BP: (116-167)/(44-78) 123/50 (02/29 1214) SpO2:  [86 %-100 %] 99 % (02/29 0817) Weight:  [65.1 kg] 65.1 kg (02/29 0400)  General - frail malnourished looking elderly african american lady in no apparent distress. Cardiovascular - Regular rhythm and rate.  Mental Status -  She is oriented to self, can follow simple commands, no dysarthria, left facial  droop, left upper and lower no movement against gravity,left hand flexion contracture RUE no drift, RLE drift, decreased sensation on left. Gait deferred  ASSESSMENT/PLAN Ms. BHAVNA LOMAX is a 77 y.o. female with history of COPD, multiple prior strokes with residual left-sided weakness, dementia, insulin-dependent type 2 diabetes, seizures, PVD status post bilateral stenting presented to the ED via EMS as code stroke for not acting like herself and having difficulty communicating   TIA vs Seizure  Code Stroke CT head No acute abnormality. Advanced Atrophy.   CTA head & neck No LVO  MRI  no acute abnormality  2D Echo EF 65-70% Routine EEG ordered LDL 39 HgbA1c 7.4 VTE prophylaxis - Lovenox    Diet   Diet heart healthy/carb modified Room service appropriate? Yes with Assist; Fluid consistency: Thin   clopidogrel 75 mg daily prior to admission, now on clopidogrel 75 mg daily.  Therapy recommendations:  pending Disposition:  pending  Hypertension Home meds:  norvasc,  Stable Permissive hypertension (OK if < 220/120) but gradually normalize in 5-7 days Long-term BP goal normotensive  Hyperlipidemia Home meds:  atorvastatin '80mg'$ ,  not resumed in hospital LDL 39, goal < 70 Continue statin at discharge  Seizure history  On gabapentin '300mg'$  TID  Would restart given this is AED  Check routine EEG   Diabetes type II UnControlled Home meds:  tradjenta and insulin  HgbA1c 7.4, goal < 7.0 CBGs Recent Labs    09/24/22 0344 09/24/22 0623 09/24/22 1212  GLUCAP 133* 102* 258*    SSI  Other Stroke Risk Factors Advanced Age >/= 64  Hx stroke/TIA  Other Active Problems COPD Dementia  Lung masses   Hospital day # 2  Beulah Gandy DNP, ACNPC-AG  [facility]  STROKE MD NOTE I have personally obtained history,examined this patient, reviewed notes, independently viewed imaging studies, participated in medical decision making and plan of care.ROS completed by me  personally and pertinent positives fully documented  I have made any additions or clarifications directly to the above note. Agree with note above. She presented with altered mental status and slurred speech and MRI is negative for acute stroke. Possibly unwitnessed seizure with  post ictal confusion versus TIA. Given her poor baseline status and diagnosis of cancer recommend palliative care approach only. D/w lady family member at bedside and Dr [name]. I have spent a total of  50  minutes with the patient reviewing hospital notes,  test results, labs and examining the patient as well as establishing an assessment and plan that was discussed personally with the patient.  > 50% of time was spent in direct patient care.       Antony Contras, MD Medical Director [place] Pager: [phone] 09/25/2022 8:27 AM   To contact Stroke Continuity provider, please refer to [email or web address] After hours, contact General Neurology

## 2022-09-24 NOTE — Telephone Encounter (Signed)
Scheduled for HFU on 3/14 with Dr [place] Bells. Routing to [facility] to assit with PET/CT

## 2022-09-24 NOTE — Progress Notes (Signed)
[facility] PROGRESS NOTE   Crystal Moore [id number] DOB: 1946/07/25 DOA: 09/22/2022  PCP: [name], MD  Brief History/Interval Summary: 77 y.o. female with medical history significant of COPD, multiple prior strokes with residual left-sided weakness, dementia, insulin-dependent type 2 diabetes, seizures, PVD status post bilateral stenting presented to the ED via EMS as code stroke for not acting like herself and having difficulty communicating. Patient was not able to give much information due to confusion and slight expressive aphasia.  She was hospitalized for further management.  Consultants: Neurology  Procedures: None yet    Subjective/Interval History: Patient remains pleasantly confused.  Denies any complaints.  Does not appear to be short of breath.   Assessment/Plan:  Transient confusion and aphasia/concern for TIA versus stroke MRI did not show any acute stroke.  LDL is 39.  Was on statin prior to admission which can be resumed at discharge. Neurology has been consulted.  Echocardiogram was a limited study due to patient's combativeness.  Normal EF noted. Patient noted to be on Plavix.  There is a reported allergy to aspirin. PT and OT evaluation. Patient does have residual left-sided weakness from previous stroke. Will ask neurology to reevaluate and give final recommendations.  Lung masses Imaging studies raise concern for masses in the lung. CT of the chest showed new bilateral pulmonary masses worrisome for neoplasm/metastatic disease.  The dominant masses in the left upper lobe.  New mediastinal and bilateral hilar lymphadenopathy also noted. Patient appears to have metastatic cancer of unknown primary but thought to be lung cancer.  Patient used to be heavy smoker previously. Discussed with patient's daughter.  She wants to know the diagnosis if possible and so amenable to pursuing biopsy.  But she understands the patient may not be a  candidate for treatments due to her poor baseline quality of life and poor functional performance.   CT of the abdomen pelvis does not show any worrisome findings in the abdominal cavity. Will consult pulmonology to provide further guidance regarding diagnostic workup.   Palliative also consulted for goals of care.  Acute respiratory failure with hypoxia Patient with saturation sin the 80's Noted to be short of breath at admission. Improved with O2. CTA without PE but does show lung masses.  Noted to be on 2 L of oxygen via nasal cannula.  Wean down as tolerated to maintain saturations greater than 90%.    History of COPD Seems to be stable.  Dementia Seems to be stable.  Insulin-dependent diabetes mellitus type 2 HbA1c 8.8 in 2022.  A1c from this admission is pending.  Continue SSI.  Peripheral artery disease Continue Plavix.  Microcytic anemia/folic acid deficiency No evidence of overt bleeding.   Anemia panel suggests iron deficiency and also showed folic acid deficiency.  Will give iron supplementation in the hospital.   DVT Prophylaxis: Lovenox Code Status: Full code Family Communication: Discussed with patient's daughter Disposition Plan: Hopefully return home when improved  Status is: Inpatient Remains inpatient appropriate because: Lung masses, concern for TIA      Medications: Scheduled:  clopidogrel  75 mg Oral Daily   enoxaparin (LOVENOX) injection  40 mg Subcutaneous Q24H   [START ON 09/26/2022] ferrous sulfate  325 mg Oral Q breakfast   folic acid  1 mg Oral Daily   insulin aspart  0-15 Units Subcutaneous TID WC   sodium chloride flush  3 mL Intravenous Once   Continuous:  ferric gluconate (FERRLECIT) IVPB     [id number] **OR** acetaminophen,  ipratropium-albuterol, morphine injection  Antibiotics: Anti-infectives (From admission, onward)    None       Objective:  Vital Signs  Vitals:   09/24/22 0008 09/24/22 0339 09/24/22 0400 09/24/22  0817  BP:  116/68  (!) 152/62  Pulse:  70  63  Resp:  17  18  Temp:  99.1 F (37.3 C)  98.4 F (36.9 C)  TempSrc:  Oral  Oral  SpO2: 100% 99%  99%  Weight:   65.1 kg   Height:        Intake/Output Summary (Last 24 hours) at 09/24/2022 0914 Last data filed at 09/23/2022 1600 Gross per 24 hour  Intake 320 ml  Output 1000 ml  Net -680 ml    Filed Weights   09/22/22 1700 09/23/22 0245 09/24/22 0400  Weight: 67 kg 67 kg 65.1 kg    General appearance: Awake alert.  In no distress.  Pleasantly confused Resp: Clear to auscultation bilaterally.  Normal effort Cardio: S1-S2 is normal regular.  No S3-S4.  No rubs murmurs or bruit GI: Abdomen is soft.  Nontender nondistended.  Bowel sounds are present normal.  No masses organomegaly Extremities: No edema.  Full range of motion of lower extremities. Neurologic: Previously noted left-sided deficit   Lab Results:  Data Reviewed: I have personally reviewed following labs and reports of the imaging studies  CBC: Recent Labs  Lab 09/22/22 1750 09/22/22 1755 09/23/22 0400 09/24/22 0503  WBC 7.7  --  8.3 8.5  NEUTROABS 4.5  --   --   --   HGB 10.2* 11.6* 9.7* 9.0*  HCT 32.7* 34.0* 31.9* 28.4*  MCV 72.3*  --  71.8* 70.1*  PLT 294  --  198 282     Basic Metabolic Panel: Recent Labs  Lab 09/22/22 1750 09/22/22 1755 09/23/22 0400 09/24/22 0503  NA 140 143 138 137  K 4.1 4.1 4.6 4.1  CL 107 106 107 107  CO2 22  --  22 22  GLUCOSE 133* 132* 82 115*  BUN '22 23 16 20  '$ CREATININE 0.89 0.80 0.82 0.96  CALCIUM 8.7*  --  8.6* 8.6*     GFR: Estimated Creatinine Clearance: 44.8 mL/min (by C-G formula based on SCr of 0.96 mg/dL).  Liver Function Tests: Recent Labs  Lab 09/22/22 1750  AST 18  ALT 20  ALKPHOS 87  BILITOT 0.4  PROT 7.7  ALBUMIN 2.7*      Coagulation Profile: Recent Labs  Lab 09/22/22 1750  INR 1.1      CBG: Recent Labs  Lab 09/23/22 1634 09/23/22 2026 09/24/22 0014 09/24/22 0344  09/24/22 0623  GLUCAP 139* 210* 157* 133* 102*     Lipid Profile: Recent Labs    09/23/22 0400  CHOL 79  HDL 27*  LDLCALC 39  TRIG 3  CHOLHDL 2.9      Radiology Studies: CT ABDOMEN PELVIS W CONTRAST  Result Date: 09/23/2022 CLINICAL DATA:  Metastatic disease evaluation, lung masses identified by prior CT chest * Tracking Code: BO * EXAM: CT ABDOMEN AND PELVIS WITH CONTRAST TECHNIQUE: Multidetector CT imaging of the abdomen and pelvis was performed using the standard protocol following bolus administration of intravenous contrast. RADIATION DOSE REDUCTION: This exam was performed according to the departmental dose-optimization program which includes automated exposure control, adjustment of the mA and/or kV according to patient size and/or use of iterative reconstruction technique. CONTRAST:  38m OMNIPAQUE IOHEXOL 350 MG/ML SOLN COMPARISON:  CT chest angiogram, 09/22/2022 FINDINGS: Lower chest: No  acute abnormality of the included lung bases. Coronary artery calcifications. Scarring and or atelectasis of the lung bases. Hepatobiliary: No solid liver abnormality is seen. Sludge or excreted contrast in the gallbladder. No discretely visualized gallstones, gallbladder wall thickening, or biliary dilatation. Pancreas: Unremarkable. No pancreatic ductal dilatation or surrounding inflammatory changes. Spleen: Normal in size without significant abnormality. Adrenals/Urinary Tract: Adrenal glands are unremarkable. Simple, benign right renal cortical cyst, for which no further follow-up characterization is required. Kidneys are otherwise normal, without renal calculi, solid lesion, or hydronephrosis. Bladder is unremarkable. Stomach/Bowel: Stomach is within normal limits. Appendix is not clearly visualized and may be surgically absent. No evidence of bowel wall thickening, distention, or inflammatory changes. Descending and sigmoid diverticulosis. Vascular/Lymphatic: Aortic atherosclerosis. No enlarged  abdominal or pelvic lymph nodes. Reproductive: Status post hysterectomy. Other: No abdominal wall hernia or abnormality. No ascites. Musculoskeletal: No acute or significant osseous findings. Age indeterminate wedge deformity of L1. IMPRESSION: 1. No evidence of mass or lymphadenopathy in the abdomen or pelvis. 2. Descending and sigmoid diverticulosis without evidence of acute diverticulitis. 3. Age indeterminate wedge deformity of L1. Correlate for acute pain. 4. Coronary artery disease. Aortic Atherosclerosis (ICD10-I70.0). Electronically Signed   By: Delanna Ahmadi M.D.   On: 09/23/2022 18:11   ECHOCARDIOGRAM COMPLETE  Result Date: 09/23/2022    ECHOCARDIOGRAM REPORT   Patient Name:   Crystal Moore [facility] Date of Exam: 09/23/2022 Medical Rec #:  [id number]       Height:       62.0 in Accession #:    [id number]      Weight:       147.7 lb Date of Birth:  August 11, 1945        BSA:          1.681 m Patient Age:    41 years        BP:           130/40 mmHg Patient Gender: F               HR:           79 bpm. Exam Location:  Inpatient Procedure: 2D Echo, Cardiac Doppler and Color Doppler Indications:    Stroke I63.9  History:        Patient has prior history of Echocardiogram examinations, most                 recent 06/28/2021. Stroke, TIA and COPD; Risk                 Factors:Hypertension, Diabetes, Dyslipidemia and Current Smoker.  Sonographer:    Ronny Flurry Referring Phys: [id number] VASUNDHRA RATHORE  Sonographer Comments: Image acquisition challenging due to uncooperative patient. IMPRESSIONS  1. Left ventricular ejection fraction, by estimation, is 65 to 70%. The left ventricle has normal function. The left ventricle has no regional wall motion abnormalities. Left ventricular diastolic function could not be evaluated.  2. Right ventricular systolic function is normal. The right ventricular size is normal.  3. The mitral valve is normal in structure. No evidence of mitral valve regurgitation. No evidence of mitral  stenosis.  4. The aortic valve is tricuspid. Aortic valve regurgitation is not visualized.  5. Limited echo as only parasternal images obtained due to patient's combativeness. If more information needed, consider repeat echo when patient able to be more comlaint with exam. FINDINGS  Left Ventricle: Left ventricular ejection fraction, by estimation, is 65 to 70%. The left ventricle has normal function. The left ventricle  has no regional wall motion abnormalities. The left ventricular internal cavity size was normal in size. There is  no left ventricular hypertrophy. Left ventricular diastolic function could not be evaluated. Right Ventricle: The right ventricular size is normal. No increase in right ventricular wall thickness. Right ventricular systolic function is normal. Left Atrium: Left atrial size was normal in size. Right Atrium: Right atrial size was not assessed. Pericardium: There is no evidence of pericardial effusion. Mitral Valve: The mitral valve is normal in structure. No evidence of mitral valve regurgitation. No evidence of mitral valve stenosis. Tricuspid Valve: The tricuspid valve is not well visualized. Tricuspid valve regurgitation is trivial. No evidence of tricuspid stenosis. Aortic Valve: The aortic valve is tricuspid. Aortic valve regurgitation is not visualized. Pulmonic Valve: The pulmonic valve was not assessed. Pulmonic valve regurgitation is not visualized. No evidence of pulmonic stenosis. Aorta: Aortic root could not be assessed. Venous: The inferior vena cava was not well visualized. IAS/Shunts: No atrial level shunt detected by color flow Doppler.  LEFT VENTRICLE PLAX 2D LVIDd:         3.90 cm LV PW:         1.10 cm LV IVS:        1.20 cm LVOT diam:     2.00 cm LVOT Area:     3.14 cm   AORTA Ao Root diam: 2.80 cm Ao Asc diam:  2.80 cm  SHUNTS Systemic Diam: 2.00 cm Glori Bickers MD Electronically signed by Glori Bickers MD Signature Date/Time: 09/23/2022/4:41:32 PM    Final     MR BRAIN WO CONTRAST  Result Date: 09/23/2022 CLINICAL DATA:  Initial evaluation for acute neuro deficit. EXAM: MRI HEAD WITHOUT CONTRAST TECHNIQUE: Multiplanar, multiecho pulse sequences of the brain and surrounding structures were obtained without intravenous contrast. COMPARISON:  Prior CT from earlier the same day as well as prior MRI from 06/27/2021. FINDINGS: Brain: Diffuse prominence of the CSF containing spaces compatible with generalized cerebral atrophy. Patchy and confluent T2/FLAIR hyperintensity involving the periventricular deep white matter both cerebral hemispheres, consistent with advanced chronic microvascular ischemic disease. Patchy involvement of the pons noted. Remote lacunar infarcts present about the bilateral basal ganglia/left corona radiata and thalami. Associated wallerian degeneration at the right cerebral peduncle. Chronic right ACA distribution infarct noted. Few small remote bilateral cerebellar infarcts noted as well. No evidence for acute or subacute ischemia. Gray-white matter differentiation maintained. No acute or chronic intracranial blood products. No mass lesion, midline shift or mass effect. Marked ventriculomegaly, presumably related to chronic ischemic changes and underlying atrophy, similar to prior. No extra-axial fluid collection. Pituitary gland and suprasellar region within normal limits. Vascular: Major intracranial vascular flow voids are maintained. Skull and upper cervical spine: Craniocervical junction within normal limits. Degenerative thickening noted about the tectorial membrane. Bone marrow signal intensity normal. No scalp soft tissue abnormality. Sinuses/Orbits: Prior bilateral ocular lens replacement. Paranasal sinuses are largely clear. No significant mastoid effusion. Other: None. IMPRESSION: 1. No acute intracranial abnormality. 2. Generalized cerebral atrophy with chronic microvascular ischemic disease, with multiple remote lacunar infarcts  involving the bilateral basal ganglia, thalami, and cerebellum. Additional chronic right ACA territory infarct. 3. Marked ventriculomegaly, presumably related to chronic ischemic changes and underlying atrophy, similar to prior. Electronically Signed   By: Jeannine Boga M.D.   On: 09/23/2022 00:26   CT Angio Chest Pulmonary Embolism (PE) W or WO Contrast  Result Date: 09/22/2022 CLINICAL DATA:  High probability for PE. Pulmonary masses concerning for malignancy. EXAM: CT  ANGIOGRAPHY CHEST WITH CONTRAST TECHNIQUE: Multidetector CT imaging of the chest was performed using the standard protocol during bolus administration of intravenous contrast. Multiplanar CT image reconstructions and MIPs were obtained to evaluate the vascular anatomy. RADIATION DOSE REDUCTION: This exam was performed according to the departmental dose-optimization program which includes automated exposure control, adjustment of the mA and/or kV according to patient size and/or use of iterative reconstruction technique. CONTRAST:  3m OMNIPAQUE IOHEXOL 350 MG/ML SOLN COMPARISON:  CT chest 02/15/2018 FINDINGS: Cardiovascular: Satisfactory opacification of the pulmonary arteries to the segmental level. No evidence of pulmonary embolism. Normal heart size. No pericardial effusion. There are atherosclerotic calcifications of the aorta. Mediastinum/Nodes: There is diffuse mediastinal and bilateral hilar lymphadenopathy which is new. Largest right hilar lymph node measures 14 mm short axis. Precarinal lymph node measures 13 mm short axis. Prevascular lymph nodes measure up to 10 mm short axis. Marked left hilar lymphadenopathy present with largest discrete lymph node measuring 2.2 x 2.6 cm image 6/55. Visualized esophagus and thyroid gland are within normal limits. Trachea and central airways are patent. Lungs/Pleura: Mild emphysematous changes are again seen. There is a new anterior left upper lobe pulmonary mass abutting the mediastinum,  pleura and hilum measuring 7.0 x 4.5 x 4.3 cm. Second largest mass is in the right upper lobe measuring 3.5 by 3.1 by 2.9 cm. There is also mass in the superior segment of the left lower lobe measuring 3.1 x 2.2 cm. There is some patchy ground-glass opacities in the lung bases. There is no pleural effusion or pneumothorax. Chronic appearing interstitial opacities persist in both lungs. There is a calcified granuloma in the right lower lobe. Upper Abdomen: No acute abnormality. There is a surgical clip or staple in the stomach measuring 15 mm in length. Musculoskeletal: No chest wall abnormality. No acute or significant osseous findings. Review of the MIP images confirms the above findings. IMPRESSION: 1. No evidence for pulmonary embolism. 2. New bilateral pulmonary masses worrisome for neoplasm/metastatic disease. The dominant masses in the left upper lobe. 3. New mediastinal and bilateral hilar lymphadenopathy most significant in the left hilum. 4. Patchy ground-glass opacities in the lung bases may be infectious/inflammatory or related to edema. Aortic Atherosclerosis (ICD10-I70.0). Electronically Signed   By: ARonney AstersM.D.   On: 09/22/2022 22:22   CT ANGIO HEAD NECK W WO CM (CODE STROKE)  Result Date: 09/22/2022 CLINICAL DATA:  Neuro deficit, acute, stroke suspected. Possible aphasia. Weakness. Unresponsive. EXAM: CT ANGIOGRAPHY HEAD AND NECK TECHNIQUE: Multidetector CT imaging of the head and neck was performed using the standard protocol during bolus administration of intravenous contrast. Multiplanar CT image reconstructions and MIPs were obtained to evaluate the vascular anatomy. Carotid stenosis measurements (when applicable) are obtained utilizing NASCET criteria, using the distal internal carotid diameter as the denominator. RADIATION DOSE REDUCTION: This exam was performed according to the departmental dose-optimization program which includes automated exposure control, adjustment of the mA  and/or kV according to patient size and/or use of iterative reconstruction technique. CONTRAST:  745mOMNIPAQUE IOHEXOL 350 MG/ML SOLN COMPARISON:  Head and neck CTA 06/27/2021 FINDINGS: CTA NECK FINDINGS Aortic arch: Normal variant aortic arch branching pattern with common origin of the brachiocephalic and left common carotid arteries. Mild-to-moderate atherosclerosis without significant stenosis of the arch vessel origins. Right carotid system: Patent with extensive, predominantly calcified plaque at the carotid bifurcation resulting in 70-75% stenosis of the proximal ICA, similar to the prior study. Left carotid system: Patent with extensive calcified and soft plaque about  the carotid bifurcation resulting in 75% or greater stenosis of the proximal ICA, similar to the prior study. Vertebral arteries: The right vertebral artery is patent with unchanged mild stenosis of its origin due to calcified plaque. There is unchanged occlusion of the left vertebral artery at its origin with faint opacification of thready V2 and V3 segments. Skeleton: No suspicious osseous lesion. Other neck: No evidence of cervical lymphadenopathy or mass. Upper chest: New 3.1 x 2.0 cm right upper lobe mass. Partially visualized large mass anteriorly in the left upper lobe measuring at least 7 cm with suspected left and possibly right hilar lymphadenopathy. Abnormal pre-vascular lymph nodes measuring up to 1.1 cm in short axis. Underlying emphysema. Review of the MIP images confirms the above findings CTA HEAD FINDINGS Anterior circulation: The internal carotid arteries are patent from skull base to carotid termini with atherosclerotic calcification resulting in moderate right and severe left cavernous stenoses, similar to prior. ACAs and MCAs are patent without evidence of a proximal branch occlusion or flow limiting A1 or M1 stenosis. Widespread branch vessel atherosclerosis is again seen including chronic moderate bilateral M2 stenoses in  a new severe distal right A2 stenosis. No aneurysm is identified. Posterior circulation: The intracranial vertebral arteries are patent to the basilar with up to mild stenosis of the strongly dominant right V4 segment. Multiple severe stenoses are again noted of the hypoplastic left V4 segment. The left PICA is patent proximally. There is a severe stenosis of the origin of the right eye AICA. Patent SCA origins are seen bilaterally, although the left SCA appears severely stenotic or chronically occluded just beyond its origin, and there is also a severe stenosis of the proximal right SCA. The basilar artery is widely patent. There is a patent left posterior communicating artery. There are moderate right and severe left P2 stenoses which have progressed. No aneurysm is identified. Venous sinuses: Patent. Anatomic variants: None. Review of the MIP images confirms the above findings These results were called by telephone at the time of interpretation on 09/22/2022 at 6:22 pm to Dr. Su Monks , who verbally acknowledged these results. IMPRESSION: 1. No emergent large vessel occlusion. 2. Advanced atherosclerosis in the head and neck as detailed above including 70-75% stenosis of the proximal right ICA and 75+% stenosis of the proximal left ICA. 3. Chronic occlusion of the left vertebral artery at its origin with thready distal reconstitution. 4. Partially visualized large left upper lobe lung mass, 3 cm right upper lobe mass, and hilar and mediastinal lymphadenopathy concerning for lung cancer. Chest CT is recommended for further evaluation. 5. Aortic Atherosclerosis (ICD10-I70.0) and Emphysema (ICD10-J43.9). Electronically Signed   By: Logan Bores M.D.   On: 09/22/2022 18:48   DG Chest Port 1 View  Result Date: 09/22/2022 CLINICAL DATA:  Short of breath, stroke symptoms EXAM: PORTABLE CHEST 1 VIEW COMPARISON:  06/27/2021 FINDINGS: Single frontal view of the chest demonstrates a stable cardiac silhouette. There  are large masses within the bilateral upper lobes, left greater than right, consistent with malignancy. Diffuse interstitial prominence is seen throughout the lungs, of uncertain acuity. Favor multifocal fibrosis and scarring. Lymphangitic spread of disease could give a similar appearance. No effusion or pneumothorax. No acute bony abnormality. IMPRESSION: 1. Enlarging bilateral upper lobe pulmonary masses consistent with progressive malignancy. If the patient would be a therapy candidate should neoplasm be detected, CT chest with IV contrast is recommended. 2. Diffuse interstitial prominence throughout the lungs, favor chronic scarring/fibrosis over lymphangitic spread of disease. Electronically  Signed   By: Randa Ngo M.D.   On: 09/22/2022 18:36   CT HEAD CODE STROKE WO CONTRAST  Result Date: 09/22/2022 CLINICAL DATA:  Code stroke. Neuro deficit, acute, stroke suspected. Weakness. Unresponsive. EXAM: CT HEAD WITHOUT CONTRAST TECHNIQUE: Contiguous axial images were obtained from the base of the skull through the vertex without intravenous contrast. RADIATION DOSE REDUCTION: This exam was performed according to the departmental dose-optimization program which includes automated exposure control, adjustment of the mA and/or kV according to patient size and/or use of iterative reconstruction technique. COMPARISON:  Head CT and MRI 06/27/2021 FINDINGS: Brain: There is no evidence of an acute large territory infarct, intracranial hemorrhage, mass, midline shift, or extra-axial fluid collection. A background of advanced cerebral atrophy and chronic small vessel ischemia is again noted with unchanged ventriculomegaly. There is a chronic right ACA infarct with asymmetric ex vacuo dilatation of the right lateral ventricle. Chronic lacunar infarcts are again noted in the deep cerebral white matter bilaterally, basal ganglia, thalami, right cerebral peduncle, and both cerebellar hemispheres. Vascular: Calcified  atherosclerosis at the skull base. No hyperdense vessel. Skull: No acute fracture or suspicious osseous lesion. Sinuses/Orbits: Paranasal sinuses and mastoid air cells are clear. Bilateral cataract extraction. Other: None. ASPECTS [facility] Stroke Program Early CT Score) Not scored given extensive chronic ischemia and no localizing findings in the provided clinical history. IMPRESSION: 1. No evidence of acute intracranial abnormality. 2. Advanced cerebral atrophy and chronic ischemia. These results were communicated to Dr. Quinn Axe at 6:07 pm on 09/22/2022 by text page via the [facility] messaging system. Electronically Signed   By: Logan Bores M.D.   On: 09/22/2022 18:07       LOS: 2 days   [facility] Pager on [email or web address]  09/24/2022, 9:14 AM

## 2022-09-24 NOTE — Consult Note (Signed)
NAME:  Crystal Moore, MRN:  [id number], DOB:  [birth year], LOS: 2 ADMISSION DATE:  09/22/2022, CONSULTATION DATE: 09/24/2022 REFERRING MD: [facility], CHIEF COMPLAINT: Bilateral pulmonary masses incidental finding and workup for TIA  History of Present Illness:  77 year old female with a plethora of health issues was initially admitted as a code stroke prior to be suspected TIA chest x-rays and CT scans of chest revealed bilateral pulmonary masses left greater than right.  This is an incidental finding pulmonary's been called and asked to evaluate for possible evaluation and diagnostic intervention.  Her past medical history is well-documented below.  She does not appear to have the ability to understand the complexity of her current situations.  Pertinent  Medical History   Past Medical History:  Diagnosis Date   Arthritis    COPD (chronic obstructive pulmonary disease) ([place])    COVID-19 virus infection 07/2020   CVA (cerebral vascular accident) ([place])    Dementia ([place])    Diabetes mellitus without complication ([place])    Hemiparesis affecting left side as late effect of cerebrovascular accident (CVA) ([place])    Pyelonephritis    Seizures ()    Stroke ([place])    Tobacco use disorder    UTI (urinary tract infection)      Significant Hospital Events: Including procedures, antibiotic start and stop dates in addition to other pertinent events     Interim History / Subjective:  Pulmonary evaluation pulmonary mass evaluation  Objective   Blood pressure (!) 152/62, pulse 63, temperature 98.4 F (36.9 C), temperature source Oral, resp. rate 18, height 5' 2.01" (1.575 m), weight 65.1 kg, SpO2 99 %.        Intake/Output Summary (Last 24 hours) at 09/24/2022 1036 Last data filed at 09/24/2022 0900 Gross per 24 hour  Intake 640 ml  Output 1000 ml  Net -360 ml   Filed Weights   09/22/22 1700 09/23/22 0245 09/24/22 0400  Weight: 67 kg 67 kg 65.1 kg    Examination: General: Elderly female  has difficulty communicating HENT: No JVD is appreciated Lungs: Decreased breath sounds throughout Cardiovascular: Heart sounds are distant Abdomen: Abdomen soft Extremities: Left upper extremity contractures noted Neuro: Somewhat strange affect difficulty communicating   Resolved Hospital Problem list     Assessment & Plan:  Incidental finding of bilateral pulmonary mass and workup for TIA possible stroke in a patient who was neurologically compromised with a history of stroke.  History of COPD Questionable if fiberoptic bronchoscopy is a possibility Pulmonary evaluation for possible bronchoscopy versus fine-needle aspiration  History of TIA with left arm contracture Per primary  Best Practice (right click and "Reselect all SmartList Selections" daily)   Diet/type: Regular consistency (see orders) DVT prophylaxis: not indicated GI prophylaxis: PPI Lines: N/A Foley:  N/A Code Status:  full code Last date of multidisciplinary goals of care discussion [TBD]  Labs   CBC: Recent Labs  Lab 09/22/22 1750 09/22/22 1755 09/23/22 0400 09/24/22 0503  WBC 7.7  --  8.3 8.5  NEUTROABS 4.5  --   --   --   HGB 10.2* 11.6* 9.7* 9.0*  HCT 32.7* 34.0* 31.9* 28.4*  MCV 72.3*  --  71.8* 70.1*  PLT 294  --  198 [id number]    Basic Metabolic Panel: Recent Labs  Lab 09/22/22 1750 09/22/22 1755 09/23/22 0400 09/24/22 0503  NA 140 143 138 137  K 4.1 4.1 4.6 4.1  CL 107 106 107 107  CO2 22  --  22 22  GLUCOSE 133* 132* 82 115*  BUN '22 23 16 20  '$ CREATININE 0.89 0.80 0.82 0.96  CALCIUM 8.7*  --  8.6* 8.6*   GFR: Estimated Creatinine Clearance: 44.8 mL/min (by C-G formula based on SCr of 0.96 mg/dL). Recent Labs  Lab 09/22/22 1750 09/23/22 0400 09/23/22 0732 09/24/22 0503  PROCALCITON  --   --  <0.10  --   WBC 7.7 8.3  --  8.5    Liver Function Tests: Recent Labs  Lab 09/22/22 1750  AST 18  ALT 20  ALKPHOS 87  BILITOT 0.4  PROT 7.7  ALBUMIN 2.7*   No results for  input(s): "LIPASE", "AMYLASE" in the last 168 hours. No results for input(s): "AMMONIA" in the last 168 hours.  ABG    Component Value Date/Time   TCO2 24 09/22/2022 1755     Coagulation Profile: Recent Labs  Lab 09/22/22 1750  INR 1.1    Cardiac Enzymes: No results for input(s): "CKTOTAL", "CKMB", "CKMBINDEX", "TROPONINI" in the last 168 hours.  HbA1C: Hgb A1c MFr Bld  Date/Time Value Ref Range Status  09/22/2022 05:50 PM 7.4 (H) 4.8 - 5.6 % Final    Comment:    (NOTE)         Prediabetes: 5.7 - 6.4         Diabetes: >6.4         Glycemic control for adults with diabetes: <7.0   06/28/2021 01:07 AM 8.8 (H) 4.8 - 5.6 % Final    Comment:    (NOTE) Pre diabetes:          5.7%-6.4%  Diabetes:              >6.4%  Glycemic control for   <7.0% adults with diabetes     CBG: Recent Labs  Lab 09/23/22 1634 09/23/22 2026 09/24/22 0014 09/24/22 0344 09/24/22 0623  GLUCAP 139* 210* 157* 133* 102*    Review of Systems:   na  Past Medical History:  She,  has a past medical history of Arthritis, COPD (chronic obstructive pulmonary disease) ([place]), COVID-19 virus infection (07/2020), CVA (cerebral vascular accident) ([place]), Dementia ([place]), Diabetes mellitus without complication ([place]), Hemiparesis affecting left side as late effect of cerebrovascular accident (CVA) ([place]), Pyelonephritis, Seizures ([place]), Stroke ([place]), Tobacco use disorder, and UTI (urinary tract infection).   Surgical History:   Past Surgical History:  Procedure Laterality Date   ABDOMINAL AORTOGRAM W/LOWER EXTREMITY N/A 08/30/2020   Procedure: ABDOMINAL AORTOGRAM W/LOWER EXTREMITY;  Surgeon: Cherre Robins, MD;  Location: [facility];  Service: Cardiovascular;  Laterality: N/A;   ABDOMINAL AORTOGRAM W/LOWER EXTREMITY N/A 09/04/2020   Procedure: ABDOMINAL AORTOGRAM W/LOWER EXTREMITY;  Surgeon: Cherre Robins, MD;  Location: [facility];  Service: Cardiovascular;  Laterality: N/A;    ESOPHAGOGASTRODUODENOSCOPY (EGD) WITH PROPOFOL N/A 09/16/2020   Procedure: ESOPHAGOGASTRODUODENOSCOPY (EGD) WITH PROPOFOL;  Surgeon: Lavena Bullion, DO;  Location: [facility];  Service: Gastroenterology;  Laterality: N/A;   HEMOSTASIS CLIP PLACEMENT  09/16/2020   Procedure: HEMOSTASIS CLIP PLACEMENT;  Surgeon: Lavena Bullion, DO;  Location: [facility];  Service: Gastroenterology;;   HOT HEMOSTASIS N/A 09/16/2020   Procedure: HOT HEMOSTASIS (ARGON PLASMA COAGULATION/BICAP);  Surgeon: Lavena Bullion, DO;  Location: [facility];  Service: Gastroenterology;  Laterality: N/A;   PERIPHERAL VASCULAR INTERVENTION Right 08/30/2020   Procedure: PERIPHERAL VASCULAR INTERVENTION;  Surgeon: Cherre Robins, MD;  Location: [facility];  Service: Cardiovascular;  Laterality: Right;  femoral popliteal  PERIPHERAL VASCULAR INTERVENTION Left 09/04/2020   Procedure: PERIPHERAL VASCULAR INTERVENTION;  Surgeon: Cherre Robins, MD;  Location: [facility];  Service: Cardiovascular;  Laterality: Left;  SFA   TUBAL LIGATION       Social History:   reports that she has been smoking cigarettes. She has a 40.00 pack-year smoking history. She has never used smokeless tobacco. She reports that she does not drink alcohol and does not use drugs.   Family History:  Her family history includes Diabetes in her brother, mother, and sister.   Allergies Allergies  Allergen Reactions   Aspirin     Can not take due to previous GI bleed - per daughter     Home Medications  Prior to Admission medications   Medication Sig Start Date End Date Taking? Authorizing Provider  ACCU-CHEK GUIDE test strip USE TO test AS DIRECTED TWICE DAILY 05/23/21   Renato Shin, MD  acetaminophen (TYLENOL) 500 MG tablet Take 1,000 mg by mouth every 6 (six) hours as needed (pain).    [provider]  albuterol (PROVENTIL) (2.5 MG/3ML) 0.083% nebulizer solution Inhale 3 mLs (2.5 mg total) into the lungs every 4  (four) hours as needed for wheezing or shortness of breath. 01/17/19   Eulogio Bear U, DO  albuterol (VENTOLIN HFA) 108 (90 Base) MCG/ACT inhaler Inhale 1 puff into the lungs every 6 (six) hours as needed for wheezing or shortness of breath.    [provider]  amLODipine (NORVASC) 5 MG tablet Take 5 mg by mouth daily. 05/30/21   [provider]  atorvastatin (LIPITOR) 80 MG tablet Take 1 tablet (80 mg total) by mouth daily at 6 PM. Patient taking differently: Take 80 mg by mouth every evening. 12/21/18   Cristal Ford, DO  clopidogrel (PLAVIX) 75 MG tablet Take 1 tablet (75 mg total) by mouth daily. 09/24/20   Cherre Robins, MD  feeding supplement (ENSURE ENLIVE / ENSURE PLUS) LIQD Take 237 mLs by mouth 2 (two) times daily between meals. 06/28/20   Renato Shin, MD  gabapentin (NEURONTIN) 300 MG capsule Take 1 capsule (300 mg total) by mouth 3 (three) times daily. 09/06/20   Hosie Poisson, MD  insulin glargine (LANTUS) 100 UNIT/ML injection Inject 0.15 mLs (15 Units total) into the skin daily. Patient taking differently: Inject 25 Units into the skin daily. 09/18/20   Terrilee Croak, MD  linagliptin (TRADJENTA) 5 MG TABS tablet Take 5 mg by mouth daily.    [provider]  pantoprazole (PROTONIX) 40 MG tablet Take 1 tablet (40 mg total) by mouth 2 (two) times daily. 09/18/20 09/06/21  Terrilee Croak, MD     Critical care time: Ferol Luz Skya Mccullum ACNP Acute Care Nurse Practitioner [place] Please consult Amion 09/24/2022, 10:36 AM

## 2022-09-25 ENCOUNTER — Other Ambulatory Visit (HOSPITAL_COMMUNITY): Payer: Self-pay

## 2022-09-25 ENCOUNTER — Other Ambulatory Visit: Payer: Self-pay

## 2022-09-25 DIAGNOSIS — R918 Other nonspecific abnormal finding of lung field: Secondary | ICD-10-CM

## 2022-09-25 DIAGNOSIS — R569 Unspecified convulsions: Secondary | ICD-10-CM

## 2022-09-25 HISTORY — DX: Unspecified convulsions: R56.9

## 2022-09-25 LAB — GLUCOSE, CAPILLARY: Glucose-Capillary: 131 mg/dL — ABNORMAL HIGH (ref 70–99)

## 2022-09-25 MED ORDER — FOLIC ACID 1 MG PO TABS
1.0000 mg | ORAL_TABLET | Freq: Every day | ORAL | 2 refills | Status: DC
  Filled 2022-09-25: qty 30, 30d supply, fill #0

## 2022-09-25 MED ORDER — FERROUS SULFATE 325 (65 FE) MG PO TABS
325.0000 mg | ORAL_TABLET | Freq: Every day | ORAL | 3 refills | Status: DC
  Filled 2022-09-25: qty 30, 30d supply, fill #0

## 2022-09-25 NOTE — Procedures (Addendum)
Patient Name: ALAISA CURLESS  MRN: [id number]  Epilepsy Attending: Lora Havens  Referring Physician/Provider: August Albino, NP  Date: 09/24/2022 Duration: 21.28 mins  Patient history: 77 y.o. female with history of COPD, multiple prior strokes with residual left-sided weakness, dementia, insulin-dependent type 2 diabetes, seizures, PVD status post bilateral stenting presented to the ED via EMS as code stroke for not acting like herself and having difficulty communicating. EEG to evaluate for seizure  Level of alertness: Awake  AEDs during EEG study: None  Technical aspects: This EEG study was done with scalp electrodes positioned according to the 10-20 International system of electrode placement. Electrical activity was reviewed with band pass filter of 1-'70Hz'$ , sensitivity of 7 uV/mm, display speed of 77m/sec with a '60Hz'$  notched filter applied as appropriate. EEG data were recorded continuously and digitally stored.  Video monitoring was available and reviewed as appropriate.  Description: The posterior dominant rhythm consists of 8 Hz activity of moderate voltage (25-35 uV) seen predominantly in posterior head regions, symmetric and reactive to eye opening and eye closing.  EEG showed continuous 2 to 3 Hz delta slowing in right frontotemporal region.  Recurrent sharp waves were noted in right frontotemporal region.  Intermittent generalized 3-'6Hz'$  theta-delta slowing was also noted. Physiologic photic driving was not seen during photic stimulation. Hyperventilation was not performed.   ABNORMALITY -Sharp wave, right frontotemporal region -Continuous slow, right frontotemporal region -Intermittent slow, generalized  IMPRESSION: This study is consistent with patient's known history of 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 Additionally there is further dysfunction arising from right frontotemporal region likely secondary to underlying stroke. Lastly there is mild  diffuse encephalopathy, nonspecific etiology. No seizures were seen throughout the recording.  [name]

## 2022-09-25 NOTE — Progress Notes (Signed)
STROKE TEAM PROGRESS NOTE   INTERVAL HISTORY No one is at the bedside.  Patient is laying in bed awake and alert in NAD.  EEG shows right frontotemporal sharp waves but no definite epileptiform activity. There have been no witnessed seizure activity. Vitals:   09/24/22 2337 09/25/22 0323 09/25/22 0346 09/25/22 0806  BP: (!) 162/57  (!) 149/54 (!) 153/65  Pulse: 70  64 72  Resp: '16  16 18  '$ Temp: 98.4 F (36.9 C)  98.9 F (37.2 C) 98.4 F (36.9 C)  TempSrc: Axillary  Axillary Oral  SpO2: 98%  94% 100%  Weight:  65.4 kg    Height:       CBC:  Recent Labs  Lab 09/22/22 1750 09/22/22 1755 09/23/22 0400 09/24/22 0503  WBC 7.7  --  8.3 8.5  NEUTROABS 4.5  --   --   --   HGB 10.2*   < > 9.7* 9.0*  HCT 32.7*   < > 31.9* 28.4*  MCV 72.3*  --  71.8* 70.1*  PLT 294  --  198 282   < > = values in this interval not displayed.   Basic Metabolic Panel:  Recent Labs  Lab 09/23/22 0400 09/24/22 0503  NA 138 137  K 4.6 4.1  CL 107 107  CO2 22 22  GLUCOSE 82 115*  BUN 16 20  CREATININE 0.82 0.96  CALCIUM 8.6* 8.6*   Lipid Panel:  Recent Labs  Lab 09/23/22 0400  CHOL 79  TRIG 67  HDL 27*  CHOLHDL 2.9  VLDL 13  LDLCALC 39   HgbA1c:  Recent Labs  Lab 09/22/22 1750  HGBA1C 7.4*   Urine Drug Screen: No results for input(s): "LABOPIA", "COCAINSCRNUR", "LABBENZ", "AMPHETMU", "THCU", "LABBARB" in the last 168 hours.  Alcohol Level  Recent Labs  Lab 09/22/22 1750  ETH <10    IMAGING past 24 hours EEG adult  Result Date: 09/25/2022 Lora Havens, MD     09/25/2022  9:25 AM Patient Name: Crystal Moore MRN: [id number] Epilepsy Attending: Lora Havens Referring Physician/Provider: August Albino, NP Date: 09/24/2022 Duration: 21.28 mins Patient history: 77 y.o. female with history of COPD, multiple prior strokes with residual left-sided weakness, dementia, insulin-dependent type 2 diabetes, seizures, PVD status post bilateral stenting presented to the ED via EMS as  code stroke for not acting like herself and having difficulty communicating. EEG to evaluate for seizure Level of alertness: Awake AEDs during EEG study: None Technical aspects: This EEG study was done with scalp electrodes positioned according to the 10-20 International system of electrode placement. Electrical activity was reviewed with band pass filter of 1-'70Hz'$ , sensitivity of 7 uV/mm, display speed of 45m/sec with a '60Hz'$  notched filter applied as appropriate. EEG data were recorded continuously and digitally stored.  Video monitoring was available and reviewed as appropriate. Description: The posterior dominant rhythm consists of 8 Hz activity of moderate voltage (25-35 uV) seen predominantly in posterior head regions, symmetric and reactive to eye opening and eye closing.  EEG showed continuous 2 to 3 Hz delta slowing in right frontotemporal region.  Recurrent sharp waves were noted in right frontotemporal region.  Intermittent generalized 3-'6Hz'$  theta-delta slowing was also noted. Physiologic photic driving was not seen during photic stimulation. Hyperventilation was not performed. ABNORMALITY -Sharp wave, right frontotemporal region -Continuous slow, right frontotemporal region -Intermittent slow, generalized IMPRESSION: This study is consistent with patient's known history of 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 Additionally there is further  dysfunction arising from right frontotemporal region likely secondary to underlying stroke. Lastly there is mild diffuse encephalopathy, nonspecific etiology. No seizures were seen throughout the recording. Priyanka Barbra Sarks    PHYSICAL EXAM  Temp:  [98.4 F (36.9 C)-98.9 F (37.2 C)] 98.4 F (36.9 C) (03/01 0806) Pulse Rate:  [64-72] 72 (03/01 0806) Resp:  [16-18] 18 (03/01 0806) BP: (146-162)/(54-66) 153/65 (03/01 0806) SpO2:  [94 %-100 %] 100 % (03/01 0806) Weight:  [65.4 kg] 65.4 kg (03/01 0323)  General - frail malnourished looking  elderly african american lady in no apparent distress. Cardiovascular - Regular rhythm and rate.  Mental Status -  She is oriented to self, can follow simple commands, no dysarthria, left facial droop, left upper and lower no movement against gravity,left hand flexion contracture RUE no drift, RLE drift, decreased sensation on left. Gait deferred  ASSESSMENT/PLAN Ms. Crystal Moore is a 77 y.o. female with history of COPD, multiple prior strokes with residual left-sided weakness, dementia, insulin-dependent type 2 diabetes, seizures, PVD status post bilateral stenting presented to the ED via EMS as code stroke for not acting like herself and having difficulty communicating   TIA vs Seizure  Code Stroke CT head No acute abnormality. Advanced Atrophy.   CTA head & neck No LVO  MRI  no acute abnormality  2D Echo EF 65-70% Routine EEG ordered LDL 39 HgbA1c 7.4 VTE prophylaxis - Lovenox    Diet   Diet heart healthy/carb modified Room service appropriate? Yes with Assist; Fluid consistency: Thin   clopidogrel 75 mg daily prior to admission, now on clopidogrel 75 mg daily.  Therapy recommendations:  pending Disposition:  pending  Hypertension Home meds:  norvasc,  Stable Permissive hypertension (OK if < 220/120) but gradually normalize in 5-7 days Long-term BP goal normotensive  Hyperlipidemia Home meds:  atorvastatin '80mg'$ ,  not resumed in hospital LDL 39, goal < 70 Continue statin at discharge  Seizure history  On gabapentin '300mg'$  TID  Would restart given this is AED  Check routine EEG   Diabetes type II UnControlled Home meds:  tradjenta and insulin  HgbA1c 7.4, goal < 7.0 CBGs Recent Labs    09/24/22 1625 09/24/22 2130 09/25/22 0617  GLUCAP 96 183* 131*    SSI  Other Stroke Risk Factors Advanced Age >/= 37  Hx stroke/TIA  Other Active Problems COPD Dementia  Lung masses   Hospital day # 3  She presented with altered mental status and slurred speech and  MRI is negative for acute stroke. Possibly unwitnessed seizure with post ictal confusion versus TIA. Given her poor baseline status and diagnosis of cancer recommend palliative care approach only. D/w  Dr [name].  Stroke team will sign off.  Kindly call for questions    Antony Contras, MD Medical Director [place] Pager: [phone] 09/25/2022 12:17 PM   To contact Stroke Continuity provider, please refer to [email or web address] After hours, contact General Neurology

## 2022-09-25 NOTE — Care Management Important Message (Signed)
Important Message  Patient Details  Name: Crystal Moore MRN: [id number] Date of Birth: 1946-02-11   Medicare Important Message Given:  Yes CORRECTION    Left prior to IM delivery will mail to the patient home address.     [name] 09/25/2022, 3:45 PM

## 2022-09-25 NOTE — Progress Notes (Signed)
Explained medications and discharge appts to [facility] and discharged pt via wheelchair with grandson

## 2022-09-25 NOTE — Progress Notes (Signed)
   Palliative Medicine Inpatient Follow Up Note HPI: 77 y.o. female  with past medical history of COPD, multiple strokes with left-sided weakness, dementia seizures, diabetes, PVD admitted on 09/22/2022 with aphasia and not acting like herself due to possible TIA vs seizure. No evidence of new acute stroke. Incidental findings of new bilateral lung masses concerning for metastatic disease   Today's Discussion 09/25/2022  *Please note that this is a verbal dictation therefore any spelling or grammatical errors are due to the "[place] One" system interpretation.  Chart reviewed inclusive of vital signs, progress notes, laboratory results, and diagnostic images.   I met with Crystal Moore at bedside this morning. She is resting comfortably and endorses no distress.   I met with her grandson, Dirisio who shares that his mother is working until [employer] today. We reviewed patient results and the plan for follow up with the pulmonology team. Discussed possibilities in terms of lung masses.   Questions and concerns addressed/Palliative Support Provided.   Objective Assessment: Vital Signs Vitals:   09/25/22 0346 09/25/22 0806  BP: (!) 149/54 (!) 153/65  Pulse: 64 72  Resp: 16 18  Temp: 98.9 F (37.2 C) 98.4 F (36.9 C)  SpO2: 94% 100%    Intake/Output Summary (Last 24 hours) at 09/25/2022 1008 Last data filed at 09/25/2022 0300 Gross per 24 hour  Intake --  Output 700 ml  Net -700 ml   Last Weight  Most recent update: 09/25/2022  3:23 AM    Weight  65.4 kg (144 lb 2.9 oz)            Gen:  Frail elderly AA F in NAD HEENT: Dry mucous membranes CV: Regular rate and rhythm  PULM: On RA, breathing is even and nonlabored ABD: soft/nontender  EXT: No edema  Neuro: Alert and oriented   SUMMARY OF RECOMMENDATIONS   Full code at this time   Plan for OP Pulm work up of lung nodule(s)  Per primary will discharge later today  Palliative needs at this time have been met  Billing based on  MDM: Moderate ________________________________________________________________________ [facility] Team Team Cell Phone: [phone] Please utilize secure chat with additional questions, if there is no response within 30 minutes please call the above phone number  Palliative Medicine Team providers are available by phone from 7am to 7pm daily and can be reached through the team cell phone.  Should this patient require assistance outside of these hours, please call the patient's attending physician.

## 2022-09-25 NOTE — Progress Notes (Signed)
TOC medications delivered to patient room primary RN aware as well as patient.

## 2022-09-25 NOTE — Care Management Important Message (Signed)
Important Message  Patient Details  Name: Crystal Moore MRN: [id number] Date of Birth: [birth year]   Medicare Important Message Given:  Yes     [name] 09/25/2022, 2:57 PM

## 2022-09-25 NOTE — TOC Transition Note (Addendum)
Transition of Care [facility]) - CM/SW Discharge Note   Patient Details  Name: Crystal Moore MRN: [id number] Date of Birth: April 15, 1946  Transition of Care [facility]) CM/SW Contact:  Pollie Friar, RN Phone Number: 09/25/2022, 9:57 AM   Clinical Narrative:    CM verified with the pts daughter that the hoyer lift has been delivered to the home. Pts grandson that is here at the hospital will provide transportation home.  Medications for discharge will be delivered to the room per [place].  1318: CM spoke to pts daughter and she is in agreement with [facility] Palliative [facility]--Hillary with [facility] accepted the referral. Information sent to pts daughter.    Final next level of care: Home/Self Care Barriers to Discharge: No Barriers Identified   Patient Goals and CMS Choice CMS Medicare.gov Compare Post Acute Care list provided to:: Patient Represenative (must comment) Choice offered to / list presented to : Adult Children  Discharge Placement                         Discharge Plan and Services Additional resources added to the After Visit Summary for     Discharge Planning Services: CM Consult            DME Arranged: Other see comment Harrel Lemon lift) DME Agency: [employer] Date DME Agency Contacted: 09/23/22   Representative spoke with at DME Agency: [facility] ([place]) Interventions SDOH Screenings   Food Insecurity: No Food Insecurity (09/23/2022)  Housing: Low Risk  (09/23/2022)  Transportation Needs: No Transportation Needs (09/23/2022)  Utilities: Not At Risk (09/23/2022)  Tobacco Use: High Risk (09/23/2022)     Readmission Risk Interventions    09/18/2020   12:44 PM  Readmission Risk Prevention Plan  Transportation Screening Complete  PCP or Specialist Appt within 3-5 Days Complete  Social Work Consult for [place] Planning/Counseling Complete  Palliative Care Screening Not Applicable  Medication Review Designer, fashion/clothing) Complete

## 2022-09-25 NOTE — Plan of Care (Signed)
  Problem: Education: Goal: Knowledge of disease or condition will improve Outcome: Adequate for Discharge Goal: Knowledge of secondary prevention will improve (MUST DOCUMENT ALL) Outcome: Adequate for Discharge Goal: Knowledge of patient specific risk factors will improve [place] N/A or DELETE if not current risk factor) Outcome: Adequate for Discharge Discharged to home with family caregivers

## 2022-09-26 NOTE — Discharge Summary (Signed)
[facility]  Physician Discharge Summary   Patient ID: Crystal Moore MRN: [id number] DOB/AGE: [birth year] 76 y.o.  Admit date: 09/22/2022 Discharge date: 09/25/2022    PCP: [name], MD  DISCHARGE DIAGNOSES:  TIA Vs Seizure Lung mass   COPD (chronic obstructive pulmonary disease) (HCC)   Insulin dependent type 2 diabetes mellitus ([place])   PVD (peripheral vascular disease) ([place])   Dementia (HCC) Chronic respiratory failure with hypoxia   RECOMMENDATIONS FOR OUTPATIENT FOLLOW UP: Pulmonology has made outpatient appointment for further evaluation of lung mass    Home Health: PT Equipment/Devices: None  CODE STATUS: Full code  DISCHARGE CONDITION: fair  Diet recommendation: As before  INITIAL HISTORY: 77 y.o. female with medical history significant of COPD, multiple prior strokes with residual left-sided weakness, dementia, insulin-dependent type 2 diabetes, seizures, PVD status post bilateral stenting presented to the ED via EMS as code stroke for not acting like herself and having difficulty communicating. Patient was not able to give much information due to confusion and slight expressive aphasia.  She was hospitalized for further management.   Consultants: Neurology.  Pulmonology.  Palliative medicine     HOSPITAL COURSE:   TIA versus seizure MRI did not show any acute stroke.  LDL is 39.  Was on statin prior to admission which can be resumed at discharge. Echocardiogram was a limited study due to patient's combativeness.  Normal EF noted. Patient noted to be on Plavix.  There is a reported allergy to aspirin. Patient does have residual left-sided weakness from previous stroke. Seen by neurology.  EEG was done which showed focal epilepsy from the right frontotemporal region.  Discussed with neurology who does not recommend any seizure medications at this time.  Discussed with patient's daughter who mentioned that patient used to be on a seizure  medicine till about 4 years ago when it was discontinued.  Daughter was notified of the findings on the EMG as well as neurology recommendations.  She was told that if patient has another episode of seizure then she will need to be placed back on seizure medications.   Lung masses Imaging studies raise concern for masses in the lung. CT of the chest showed new bilateral pulmonary masses worrisome for neoplasm/metastatic disease.  The dominant masses in the left upper lobe.  New mediastinal and bilateral hilar lymphadenopathy also noted. Patient appears to have metastatic cancer of unknown primary but thought to be lung cancer.  Patient used to be heavy smoker previously. Discussed with patient's daughter.  She wants to know the diagnosis if possible and so amenable to pursuing biopsy.  But she understands the patient may not be a candidate for treatments due to her poor baseline quality of life and poor functional performance.   CT of the abdomen pelvis does not show any worrisome findings in the abdominal cavity. Seen by pulmonology.  They will initiate further workup in the outpatient setting including a PET/CT. Seen by palliative medicine as well.  Remains full code for now.   Chronic respiratory failure with hypoxia Informed by daughter that she uses oxygen at home chronically.      History of COPD Seems to be stable.   Dementia Seems to be stable.   Insulin-dependent diabetes mellitus type 2   Peripheral artery disease Continue Plavix.   Microcytic anemia/folic acid deficiency No evidence of overt bleeding.   Anemia panel suggests iron deficiency and also showed folic acid deficiency.  She was given iron supplements in the hospital and will  be discharged on the same.  Will also be discharged on folic acid.  Patient is stable.  Okay for discharge home today.   PERTINENT LABS:  The results of significant diagnostics from this hospitalization (including imaging, microbiology,  ancillary and laboratory) are listed below for reference.     Labs:   Basic Metabolic Panel: Recent Labs  Lab 09/22/22 1750 09/22/22 1755 09/23/22 0400 09/24/22 0503  NA 140 143 138 137  K 4.1 4.1 4.6 4.1  CL 107 106 107 107  CO2 22  --  22 22  GLUCOSE 133* 132* 82 115*  BUN '22 23 16 20  '$ CREATININE 0.89 0.80 0.82 0.96  CALCIUM 8.7*  --  8.6* 8.6*   Liver Function Tests: Recent Labs  Lab 09/22/22 1750  AST 18  ALT 20  ALKPHOS 87  BILITOT 0.4  PROT 7.7  ALBUMIN 2.7*    CBC: Recent Labs  Lab 09/22/22 1750 09/22/22 1755 09/23/22 0400 09/24/22 0503  WBC 7.7  --  8.3 8.5  NEUTROABS 4.5  --   --   --   HGB 10.2* 11.6* 9.7* 9.0*  HCT 32.7* 34.0* 31.9* 28.4*  MCV 72.3*  --  71.8* 70.1*  PLT 294  --  198 282   BNP: BNP (last 3 results) Recent Labs    09/23/22 0400  BNP 38.6     CBG: Recent Labs  Lab 09/24/22 0623 09/24/22 1212 09/24/22 1625 09/24/22 2130 09/25/22 0617  GLUCAP 102* 258* 96 183* 131*     IMAGING STUDIES EEG adult  Result Date: 09/25/2022 Lora Havens, MD     09/25/2022  9:25 AM Patient Name: Crystal Moore MRN: [id number] Epilepsy Attending: Lora Havens Referring Physician/Provider: August Albino, NP Date: 09/24/2022 Duration: 21.28 mins Patient history: 77 y.o. female with history of COPD, multiple prior strokes with residual left-sided weakness, dementia, insulin-dependent type 2 diabetes, seizures, PVD status post bilateral stenting presented to the ED via EMS as code stroke for not acting like herself and having difficulty communicating. EEG to evaluate for seizure Level of alertness: Awake AEDs during EEG study: None Technical aspects: This EEG study was done with scalp electrodes positioned according to the 10-20 International system of electrode placement. Electrical activity was reviewed with band pass filter of 1-'70Hz'$ , sensitivity of 7 uV/mm, display speed of 60m/sec with a '60Hz'$  notched filter applied as appropriate. EEG  data were recorded continuously and digitally stored.  Video monitoring was available and reviewed as appropriate. Description: The posterior dominant rhythm consists of 8 Hz activity of moderate voltage (25-35 uV) seen predominantly in posterior head regions, symmetric and reactive to eye opening and eye closing.  EEG showed continuous 2 to 3 Hz delta slowing in right frontotemporal region.  Recurrent sharp waves were noted in right frontotemporal region.  Intermittent generalized 3-'6Hz'$  theta-delta slowing was also noted. Physiologic photic driving was not seen during photic stimulation. Hyperventilation was not performed. ABNORMALITY -Sharp wave, right frontotemporal region -Continuous slow, right frontotemporal region -Intermittent slow, generalized IMPRESSION: This study is consistent with patient's known history of 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 Additionally there is further dysfunction arising from right frontotemporal region likely secondary to underlying stroke. Lastly there is mild diffuse encephalopathy, nonspecific etiology. No seizures were seen throughout the recording. Crystal Moore  CT ABDOMEN PELVIS W CONTRAST  Result Date: 09/23/2022 CLINICAL DATA:  Metastatic disease evaluation, lung masses identified by prior CT chest * Tracking Code: BO * EXAM: CT ABDOMEN  AND PELVIS WITH CONTRAST TECHNIQUE: Multidetector CT imaging of the abdomen and pelvis was performed using the standard protocol following bolus administration of intravenous contrast. RADIATION DOSE REDUCTION: This exam was performed according to the departmental dose-optimization program which includes automated exposure control, adjustment of the mA and/or kV according to patient size and/or use of iterative reconstruction technique. CONTRAST:  19m OMNIPAQUE IOHEXOL 350 MG/ML SOLN COMPARISON:  CT chest angiogram, 09/22/2022 FINDINGS: Lower chest: No acute abnormality of the included lung bases. Coronary  artery calcifications. Scarring and or atelectasis of the lung bases. Hepatobiliary: No solid liver abnormality is seen. Sludge or excreted contrast in the gallbladder. No discretely visualized gallstones, gallbladder wall thickening, or biliary dilatation. Pancreas: Unremarkable. No pancreatic ductal dilatation or surrounding inflammatory changes. Spleen: Normal in size without significant abnormality. Adrenals/Urinary Tract: Adrenal glands are unremarkable. Simple, benign right renal cortical cyst, for which no further follow-up characterization is required. Kidneys are otherwise normal, without renal calculi, solid lesion, or hydronephrosis. Bladder is unremarkable. Stomach/Bowel: Stomach is within normal limits. Appendix is not clearly visualized and may be surgically absent. No evidence of bowel wall thickening, distention, or inflammatory changes. Descending and sigmoid diverticulosis. Vascular/Lymphatic: Aortic atherosclerosis. No enlarged abdominal or pelvic lymph nodes. Reproductive: Status post hysterectomy. Other: No abdominal wall hernia or abnormality. No ascites. Musculoskeletal: No acute or significant osseous findings. Age indeterminate wedge deformity of L1. IMPRESSION: 1. No evidence of mass or lymphadenopathy in the abdomen or pelvis. 2. Descending and sigmoid diverticulosis without evidence of acute diverticulitis. 3. Age indeterminate wedge deformity of L1. Correlate for acute pain. 4. Coronary artery disease. Aortic Atherosclerosis (ICD10-I70.0). Electronically Signed   By: ADelanna AhmadiM.D.   On: 09/23/2022 18:11   ECHOCARDIOGRAM COMPLETE  Result Date: 09/23/2022    ECHOCARDIOGRAM REPORT   [facility]2022 Medical Rec #:  [id number]      Height:       62.0 in Accession #:    [id number]     Weight:       147.7 lb Date of Birth:  330-May-1947       BSA:          1.681 m Patient Age:    89years        BP:           130/40 mmHg Patient Gender: F                HR:           79 bpm. Exam Location:  Inpatient Procedure: 2D Echo, Cardiac Doppler and Color Doppler Indications:    Stroke I63.9  History:        Patient has prior history of Echocardiogram examinations, most                 recent 06/28/2021. Stroke, TIA and COPD; Risk                 Factors:Hypertension, Diabetes, Dyslipidemia and Current Smoker.  Sonographer:    SRonny FlurryReferring Phys: [id number] RATHORE  Sonographer Comments: Image acquisition challenging due to uncooperative patient. IMPRESSIONS  1. Left ventricular ejection fraction, by estimation, is 65 to 70%. The left ventricle has normal function. The left ventricle has no regional wall motion abnormalities. Left ventricular diastolic function could not be evaluated.  2. Right ventricular systolic function is normal. The right ventricular size is normal.  3. The mitral valve is normal in  structure. No evidence of mitral valve regurgitation. No evidence of mitral stenosis.  4. The aortic valve is tricuspid. Aortic valve regurgitation is not visualized.  5. Limited echo as only parasternal images obtained due to patient's combativeness. If more information needed, consider repeat echo when patient able to be more comlaint with exam. FINDINGS  Left Ventricle: Left ventricular ejection fraction, by estimation, is 65 to 70%. The left ventricle has normal function. The left ventricle has no regional wall motion abnormalities. The left ventricular internal cavity size was normal in size. There is  no left ventricular hypertrophy. Left ventricular diastolic function could not be evaluated. Right Ventricle: The right ventricular size is normal. No increase in right ventricular wall thickness. Right ventricular systolic function is normal. Left Atrium: Left atrial size was normal in size. Right Atrium: Right atrial size was not assessed. Pericardium: There is no evidence of pericardial effusion. Mitral Valve: The mitral valve is normal in  structure. No evidence of mitral valve regurgitation. No evidence of mitral valve stenosis. Tricuspid Valve: The tricuspid valve is not well visualized. Tricuspid valve regurgitation is trivial. No evidence of tricuspid stenosis. Aortic Valve: The aortic valve is tricuspid. Aortic valve regurgitation is not visualized. Pulmonic Valve: The pulmonic valve was not assessed. Pulmonic valve regurgitation is not visualized. No evidence of pulmonic stenosis. Aorta: Aortic root could not be assessed. Venous: The inferior vena cava was not well visualized. IAS/Shunts: No atrial level shunt detected by color flow Doppler.  LEFT VENTRICLE PLAX 2D LVIDd:         3.90 cm LV PW:         1.10 cm LV IVS:        1.20 cm LVOT diam:     2.00 cm LVOT Area:     3.14 cm   AORTA Ao Root diam: 2.80 cm Ao Asc diam:  2.80 cm  SHUNTS Systemic Diam: 2.00 cm Crystal Bickers MD Electronically signed by Crystal Bickers MD Signature Date/Time: 09/23/2022/4:41:32 PM    Final    MR BRAIN WO CONTRAST  Result Date: 09/23/2022 CLINICAL DATA:  Initial evaluation for acute neuro deficit. EXAM: MRI HEAD WITHOUT CONTRAST TECHNIQUE: Multiplanar, multiecho pulse sequences of the brain and surrounding structures were obtained without intravenous contrast. COMPARISON:  Prior CT from earlier the same day as well as prior MRI from 06/27/2021. FINDINGS: Brain: Diffuse prominence of the CSF containing spaces compatible with generalized cerebral atrophy. Patchy and confluent T2/FLAIR hyperintensity involving the periventricular deep white matter both cerebral hemispheres, consistent with advanced chronic microvascular ischemic disease. Patchy involvement of the pons noted. Remote lacunar infarcts present about the bilateral basal ganglia/left corona radiata and thalami. Associated wallerian degeneration at the right cerebral peduncle. Chronic right ACA distribution infarct noted. Few small remote bilateral cerebellar infarcts noted as well. No evidence for  acute or subacute ischemia. Gray-white matter differentiation maintained. No acute or chronic intracranial blood products. No mass lesion, midline shift or mass effect. Marked ventriculomegaly, presumably related to chronic ischemic changes and underlying atrophy, similar to prior. No extra-axial fluid collection. Pituitary gland and suprasellar region within normal limits. Vascular: Major intracranial vascular flow voids are maintained. Skull and upper cervical spine: Craniocervical junction within normal limits. Degenerative thickening noted about the tectorial membrane. Bone marrow signal intensity normal. No scalp soft tissue abnormality. Sinuses/Orbits: Prior bilateral ocular lens replacement. Paranasal sinuses are largely clear. No significant mastoid effusion. Other: None. IMPRESSION: 1. No acute intracranial abnormality. 2. Generalized cerebral atrophy with chronic microvascular ischemic disease, with  multiple remote lacunar infarcts involving the bilateral basal ganglia, thalami, and cerebellum. Additional chronic right ACA territory infarct. 3. Marked ventriculomegaly, presumably related to chronic ischemic changes and underlying atrophy, similar to prior. Electronically Signed   By: Jeannine Boga M.D.   On: 09/23/2022 00:26   CT Angio Chest Pulmonary Embolism (PE) W or WO Contrast  Result Date: 09/22/2022 CLINICAL DATA:  High probability for PE. Pulmonary masses concerning for malignancy. EXAM: CT ANGIOGRAPHY CHEST WITH CONTRAST TECHNIQUE: Multidetector CT imaging of the chest was performed using the standard protocol during bolus administration of intravenous contrast. Multiplanar CT image reconstructions and MIPs were obtained to evaluate the vascular anatomy. RADIATION DOSE REDUCTION: This exam was performed according to the departmental dose-optimization program which includes automated exposure control, adjustment of the mA and/or kV according to patient size and/or use of iterative  reconstruction technique. CONTRAST:  38m OMNIPAQUE IOHEXOL 350 MG/ML SOLN COMPARISON:  CT chest 02/15/2018 FINDINGS: Cardiovascular: Satisfactory opacification of the pulmonary arteries to the segmental level. No evidence of pulmonary embolism. Normal heart size. No pericardial effusion. There are atherosclerotic calcifications of the aorta. Mediastinum/Nodes: There is diffuse mediastinal and bilateral hilar lymphadenopathy which is new. Largest right hilar lymph node measures 14 mm short axis. Precarinal lymph node measures 13 mm short axis. Prevascular lymph nodes measure up to 10 mm short axis. Marked left hilar lymphadenopathy present with largest discrete lymph node measuring 2.2 x 2.6 cm image 6/55. Visualized esophagus and thyroid gland are within normal limits. Trachea and central airways are patent. Lungs/Pleura: Mild emphysematous changes are again seen. There is a new anterior left upper lobe pulmonary mass abutting the mediastinum, pleura and hilum measuring 7.0 x 4.5 x 4.3 cm. Second largest mass is in the right upper lobe measuring 3.5 by 3.1 by 2.9 cm. There is also mass in the superior segment of the left lower lobe measuring 3.1 x 2.2 cm. There is some patchy ground-glass opacities in the lung bases. There is no pleural effusion or pneumothorax. Chronic appearing interstitial opacities persist in both lungs. There is a calcified granuloma in the right lower lobe. Upper Abdomen: No acute abnormality. There is a surgical clip or staple in the stomach measuring 15 mm in length. Musculoskeletal: No chest wall abnormality. No acute or significant osseous findings. Review of the MIP images confirms the above findings. IMPRESSION: 1. No evidence for pulmonary embolism. 2. New bilateral pulmonary masses worrisome for neoplasm/metastatic disease. The dominant masses in the left upper lobe. 3. New mediastinal and bilateral hilar lymphadenopathy most significant in the left hilum. 4. Patchy ground-glass  opacities in the lung bases may be infectious/inflammatory or related to edema. Aortic Atherosclerosis (ICD10-I70.0). Electronically Signed   By: ARonney AstersM.D.   On: 09/22/2022 22:22   CT ANGIO HEAD NECK W WO CM (CODE STROKE)  Result Date: 09/22/2022 CLINICAL DATA:  Neuro deficit, acute, stroke suspected. Possible aphasia. Weakness. Unresponsive. EXAM: CT ANGIOGRAPHY HEAD AND NECK TECHNIQUE: Multidetector CT imaging of the head and neck was performed using the standard protocol during bolus administration of intravenous contrast. Multiplanar CT image reconstructions and MIPs were obtained to evaluate the vascular anatomy. Carotid stenosis measurements (when applicable) are obtained utilizing NASCET criteria, using the distal internal carotid diameter as the denominator. RADIATION DOSE REDUCTION: This exam was performed according to the departmental dose-optimization program which includes automated exposure control, adjustment of the mA and/or kV according to patient size and/or use of iterative reconstruction technique. CONTRAST:  741mOMNIPAQUE IOHEXOL 350 MG/ML SOLN  COMPARISON:  Head and neck CTA 06/27/2021 FINDINGS: CTA NECK FINDINGS Aortic arch: Normal variant aortic arch branching pattern with common origin of the brachiocephalic and left common carotid arteries. Mild-to-moderate atherosclerosis without significant stenosis of the arch vessel origins. Right carotid system: Patent with extensive, predominantly calcified plaque at the carotid bifurcation resulting in 70-75% stenosis of the proximal ICA, similar to the prior study. Left carotid system: Patent with extensive calcified and soft plaque about the carotid bifurcation resulting in 75% or greater stenosis of the proximal ICA, similar to the prior study. Vertebral arteries: The right vertebral artery is patent with unchanged mild stenosis of its origin due to calcified plaque. There is unchanged occlusion of the left vertebral artery at its  origin with faint opacification of thready V2 and V3 segments. Skeleton: No suspicious osseous lesion. Other neck: No evidence of cervical lymphadenopathy or mass. Upper chest: New 3.1 x 2.0 cm right upper lobe mass. Partially visualized large mass anteriorly in the left upper lobe measuring at least 7 cm with suspected left and possibly right hilar lymphadenopathy. Abnormal pre-vascular lymph nodes measuring up to 1.1 cm in short axis. Underlying emphysema. Review of the MIP images confirms the above findings CTA HEAD FINDINGS Anterior circulation: The internal carotid arteries are patent from skull base to carotid termini with atherosclerotic calcification resulting in moderate right and severe left cavernous stenoses, similar to prior. ACAs and MCAs are patent without evidence of a proximal branch occlusion or flow limiting A1 or M1 stenosis. Widespread branch vessel atherosclerosis is again seen including chronic moderate bilateral M2 stenoses in a new severe distal right A2 stenosis. No aneurysm is identified. Posterior circulation: The intracranial vertebral arteries are patent to the basilar with up to mild stenosis of the strongly dominant right V4 segment. Multiple severe stenoses are again noted of the hypoplastic left V4 segment. The left PICA is patent proximally. There is a severe stenosis of the origin of the right eye AICA. Patent SCA origins are seen bilaterally, although the left SCA appears severely stenotic or chronically occluded just beyond its origin, and there is also a severe stenosis of the proximal right SCA. The basilar artery is widely patent. There is a patent left posterior communicating artery. There are moderate right and severe left P2 stenoses which have progressed. No aneurysm is identified. Venous sinuses: Patent. Anatomic variants: None. Review of the MIP images confirms the above findings These results were called by telephone at the time of interpretation on 09/22/2022 at 6:22  pm to Dr. Su Monks , who verbally acknowledged these results. IMPRESSION: 1. No emergent large vessel occlusion. 2. Advanced atherosclerosis in the head and neck as detailed above including 70-75% stenosis of the proximal right ICA and 75+% stenosis of the proximal left ICA. 3. Chronic occlusion of the left vertebral artery at its origin with thready distal reconstitution. 4. Partially visualized large left upper lobe lung mass, 3 cm right upper lobe mass, and hilar and mediastinal lymphadenopathy concerning for lung cancer. Chest CT is recommended for further evaluation. 5. Aortic Atherosclerosis (ICD10-I70.0) and Emphysema (ICD10-J43.9). Electronically Signed   By: Logan Bores M.D.   On: 09/22/2022 18:48   DG Chest Port 1 View  Result Date: 09/22/2022 CLINICAL DATA:  Short of breath, stroke symptoms EXAM: PORTABLE CHEST 1 VIEW COMPARISON:  06/27/2021 FINDINGS: Single frontal view of the chest demonstrates a stable cardiac silhouette. There are large masses within the bilateral upper lobes, left greater than right, consistent with malignancy. Diffuse interstitial prominence  is seen throughout the lungs, of uncertain acuity. Favor multifocal fibrosis and scarring. Lymphangitic spread of disease could give a similar appearance. No effusion or pneumothorax. No acute bony abnormality. IMPRESSION: 1. Enlarging bilateral upper lobe pulmonary masses consistent with progressive malignancy. If the patient would be a therapy candidate should neoplasm be detected, CT chest with IV contrast is recommended. 2. Diffuse interstitial prominence throughout the lungs, favor chronic scarring/fibrosis over lymphangitic spread of disease. Electronically Signed   By: Randa Ngo M.D.   On: 09/22/2022 18:36   CT HEAD CODE STROKE WO CONTRAST  Result Date: 09/22/2022 CLINICAL DATA:  Code stroke. Neuro deficit, acute, stroke suspected. Weakness. Unresponsive. EXAM: CT HEAD WITHOUT CONTRAST TECHNIQUE: Contiguous axial images  were obtained from the base of the skull through the vertex without intravenous contrast. RADIATION DOSE REDUCTION: This exam was performed according to the departmental dose-optimization program which includes automated exposure control, adjustment of the mA and/or kV according to patient size and/or use of iterative reconstruction technique. COMPARISON:  Head CT and MRI 06/27/2021 FINDINGS: Brain: There is no evidence of an acute large territory infarct, intracranial hemorrhage, mass, midline shift, or extra-axial fluid collection. A background of advanced cerebral atrophy and chronic small vessel ischemia is again noted with unchanged ventriculomegaly. There is a chronic right ACA infarct with asymmetric ex vacuo dilatation of the right lateral ventricle. Chronic lacunar infarcts are again noted in the deep cerebral white matter bilaterally, basal ganglia, thalami, right cerebral peduncle, and both cerebellar hemispheres. Vascular: Calcified atherosclerosis at the skull base. No hyperdense vessel. Skull: No acute fracture or suspicious osseous lesion. Sinuses/Orbits: Paranasal sinuses and mastoid air cells are clear. Bilateral cataract extraction. Other: None. ASPECTS [facility] Stroke Program Early CT Score) Not scored given extensive chronic ischemia and no localizing findings in the provided clinical history. IMPRESSION: 1. No evidence of acute intracranial abnormality. 2. Advanced cerebral atrophy and chronic ischemia. These results were communicated to Dr. Quinn Axe at 6:07 pm on 09/22/2022 by text page via the [facility] messaging system. Electronically Signed   By: Logan Bores M.D.   On: 09/22/2022 18:07    DISCHARGE EXAMINATION: Vitals:   09/24/22 2337 09/25/22 0323 09/25/22 0346 09/25/22 0806  BP: (!) 162/57  (!) 149/54 (!) 153/65  Pulse: 70  64 72  Resp: '16  16 18  '$ Temp: 98.4 F (36.9 C)  98.9 F (37.2 C) 98.4 F (36.9 C)  TempSrc: Axillary  Axillary Oral  SpO2: 98%  94% 100%  Weight:  65.4 kg     Height:       General appearance: Awake alert.  In no distress Resp: Clear to auscultation bilaterally.  Normal effort Cardio: S1-S2 is normal regular.  No S3-S4.  No rubs murmurs or bruit GI: Abdomen is soft.  Nontender nondistended.  Bowel sounds are present normal.  No masses organomegaly   DISPOSITION: Home  Discharge Instructions     Call MD for:  difficulty breathing, headache or visual disturbances   Complete by: As directed    Call MD for:  extreme fatigue   Complete by: As directed    Call MD for:  persistant dizziness or light-headedness   Complete by: As directed    Call MD for:  persistant nausea and vomiting   Complete by: As directed    Call MD for:  severe uncontrolled pain   Complete by: As directed    Call MD for:  temperature >100.4   Complete by: As directed    Diet general  Complete by: As directed    Discharge instructions   Complete by: As directed    Please keep your follow-up appointments.  If you have another seizure activity please reach out to physicians immediately as you may need to be placed on seizure medications.  You were cared for by a hospitalist during your hospital stay. If you have any questions about your discharge medications or the care you received while you were in the hospital after you are discharged, you can call the unit and asked to speak with the hospitalist on call if the hospitalist that took care of you is not available. Once you are discharged, your primary care physician will handle any further medical issues. Please note that NO REFILLS for any discharge medications will be authorized once you are discharged, as it is imperative that you return to your primary care physician (or establish a relationship with a primary care physician if you do not have one) for your aftercare needs so that they can reassess your need for medications and monitor your lab values. If you do not have a primary care physician, you can call [phone] for  a physician referral.   Increase activity slowly   Complete by: As directed           Allergies as of 09/25/2022       Reactions   Aspirin    Can not take due to previous GI bleed - per daughter        Medication List     TAKE these medications    Accu-Chek Guide test strip Generic drug: glucose blood USE TO test AS DIRECTED TWICE DAILY   acetaminophen 500 MG tablet Commonly known as: TYLENOL Take 1,000 mg by mouth every 6 (six) hours as needed (pain).   albuterol 108 (90 Base) MCG/ACT inhaler Commonly known as: VENTOLIN HFA Inhale 1 puff into the lungs every 6 (six) hours as needed for wheezing or shortness of breath.   albuterol (2.5 MG/3ML) 0.083% nebulizer solution Commonly known as: PROVENTIL Inhale 3 mLs (2.5 mg total) into the lungs every 4 (four) hours as needed for wheezing or shortness of breath.   amLODipine 5 MG tablet Commonly known as: NORVASC Take 5 mg by mouth daily.   atorvastatin 80 MG tablet Commonly known as: LIPITOR Take 1 tablet (80 mg total) by mouth daily at 6 PM. What changed: when to take this   clopidogrel 75 MG tablet Commonly known as: PLAVIX Take 1 tablet (75 mg total) by mouth daily.   feeding supplement Liqd Take 237 mLs by mouth 2 (two) times daily between meals.   FeroSul 325 (65 FE) MG tablet Generic drug: ferrous sulfate Take 1 tablet (325 mg total) by mouth daily with breakfast.   folic acid 1 MG tablet Commonly known as: FOLVITE Take 1 tablet (1 mg total) by mouth daily.   gabapentin 300 MG capsule Commonly known as: NEURONTIN Take 1 capsule (300 mg total) by mouth 3 (three) times daily.   insulin glargine 100 UNIT/ML injection Commonly known as: LANTUS Inject 0.15 mLs (15 Units total) into the skin daily. What changed: how much to take   linagliptin 5 MG Tabs tablet Commonly known as: TRADJENTA Take 5 mg by mouth daily.   pantoprazole 40 MG tablet Commonly known as: PROTONIX Take 1 tablet (40 mg total)  by mouth 2 (two) times daily.          Follow-up Information     Juanito Doom, MD Follow  up on 10/08/2022.   Specialty: Pulmonary Disease Why: for lung mass. On 10/08/22 at 1:30PM Contact information: [place] [address] [phone]         [facility] .   Specialty: PALLIATIVE CARE Contact information: [address] [place] [place] [phone]                TOTAL DISCHARGE TIME: 37 minutes  [place]  [facility] Pager on [email or web address]  09/26/2022, 10:45 AM

## 2022-09-27 DIAGNOSIS — J439 Emphysema, unspecified: Secondary | ICD-10-CM | POA: Diagnosis not present

## 2022-10-08 ENCOUNTER — Inpatient Hospital Stay: Payer: 59 | Admitting: Pulmonary Disease

## 2022-10-12 ENCOUNTER — Institutional Professional Consult (permissible substitution): Payer: 59 | Admitting: Pulmonary Disease

## 2022-10-13 ENCOUNTER — Encounter (HOSPITAL_COMMUNITY)
Admission: RE | Admit: 2022-10-13 | Discharge: 2022-10-13 | Disposition: A | Discharge status: Home / Self Care | Payer: 59 | Source: Ambulatory Visit | Attending: Pulmonary Disease | Admitting: Pulmonary Disease

## 2022-10-13 DIAGNOSIS — R918 Other nonspecific abnormal finding of lung field: Secondary | ICD-10-CM | POA: Diagnosis not present

## 2022-10-13 DIAGNOSIS — J439 Emphysema, unspecified: Secondary | ICD-10-CM | POA: Diagnosis not present

## 2022-10-13 DIAGNOSIS — J432 Centrilobular emphysema: Secondary | ICD-10-CM | POA: Diagnosis not present

## 2022-10-13 LAB — GLUCOSE, CAPILLARY: Glucose-Capillary: 206 mg/dL — ABNORMAL HIGH (ref 70–99)

## 2022-10-13 MED ORDER — FLUDEOXYGLUCOSE F - 18 (FDG) INJECTION
7.5000 | Freq: Once | INTRAVENOUS | Status: AC | PRN
  Administered 2022-10-13: 6.58 via INTRAVENOUS

## 2022-10-14 ENCOUNTER — Telehealth: Payer: Self-pay

## 2022-10-14 NOTE — Telephone Encounter (Signed)
Spoke to pt's daughter She verbalized understanding the switching of providers. pt told to come in 3:15PM

## 2022-10-15 ENCOUNTER — Ambulatory Visit (INDEPENDENT_AMBULATORY_CARE_PROVIDER_SITE_OTHER): Payer: 59 | Admitting: Pulmonary Disease

## 2022-10-15 ENCOUNTER — Encounter: Payer: Self-pay | Admitting: Pulmonary Disease

## 2022-10-15 VITALS — BP 100/60 | HR 79 | Ht 62.0 in | Wt 141.0 lb

## 2022-10-15 DIAGNOSIS — C3412 Malignant neoplasm of upper lobe, left bronchus or lung: Secondary | ICD-10-CM | POA: Insufficient documentation

## 2022-10-15 DIAGNOSIS — R942 Abnormal results of pulmonary function studies: Secondary | ICD-10-CM | POA: Diagnosis not present

## 2022-10-15 DIAGNOSIS — R918 Other nonspecific abnormal finding of lung field: Secondary | ICD-10-CM

## 2022-10-15 NOTE — H&P (View-Only) (Signed)
 Synopsis: Referred in March 2024 for bilateral lung masses by Varadarajan, Rupashree,*  Subjective:   PATIENT ID: Crystal Moore GENDER: female DOB: 06/19/1946, MRN: [id number]  Chief Complaint  Patient presents with   Consult    Lung mass.    This is a 77-year-old female, past medical history of type 2 diabetes, dementia, stroke longstanding tobacco abuse disorder.  Non-smoker at this time.  She was originally brought to the emergency room for evaluation of seizure-like activity.  She has recovered from this hospitalization but upon ER evaluation she had CT imaging of the chest revealed bilateral masses within the chest.  This was concerning for an advanced age bronchogenic carcinoma.  Brain MR imaging on this admission to the hospital showed no evidence of metastatic disease to the brain.    Past Medical History:  Diagnosis Date   Arthritis    COPD (chronic obstructive pulmonary disease) (HCC)    COVID-19 virus infection 07/2020   CVA (cerebral vascular accident) (HCC)    Dementia (HCC)    Diabetes mellitus without complication (HCC)    Hemiparesis affecting left side as late effect of cerebrovascular accident (CVA) (HCC)    Pyelonephritis    Seizures (HCC)    Stroke (HCC)    Tobacco use disorder    UTI (urinary tract infection)      Family History  Problem Relation Age of Onset   Diabetes Mother    Diabetes Sister    Diabetes Brother      Past Surgical History:  Procedure Laterality Date   ABDOMINAL AORTOGRAM W/LOWER EXTREMITY N/A 08/30/2020   Procedure: ABDOMINAL AORTOGRAM W/LOWER EXTREMITY;  Surgeon: Hawken, Thomas N, MD;  Location: [facility];  Service: Cardiovascular;  Laterality: N/A;   ABDOMINAL AORTOGRAM W/LOWER EXTREMITY N/A 09/04/2020   Procedure: ABDOMINAL AORTOGRAM W/LOWER EXTREMITY;  Surgeon: Hawken, Thomas N, MD;  Location: [facility];  Service: Cardiovascular;  Laterality: N/A;   ESOPHAGOGASTRODUODENOSCOPY (EGD) WITH PROPOFOL N/A 09/16/2020    Procedure: ESOPHAGOGASTRODUODENOSCOPY (EGD) WITH PROPOFOL;  Surgeon: Cirigliano, Vito V, DO;  Location: [facility];  Service: Gastroenterology;  Laterality: N/A;   HEMOSTASIS CLIP PLACEMENT  09/16/2020   Procedure: HEMOSTASIS CLIP PLACEMENT;  Surgeon: Cirigliano, Vito V, DO;  Location: [facility];  Service: Gastroenterology;;   HOT HEMOSTASIS N/A 09/16/2020   Procedure: HOT HEMOSTASIS (ARGON PLASMA COAGULATION/BICAP);  Surgeon: Cirigliano, Vito V, DO;  Location: [facility];  Service: Gastroenterology;  Laterality: N/A;   PERIPHERAL VASCULAR INTERVENTION Right 08/30/2020   Procedure: PERIPHERAL VASCULAR INTERVENTION;  Surgeon: Hawken, Thomas N, MD;  Location: [facility];  Service: Cardiovascular;  Laterality: Right;  femoral popliteal   PERIPHERAL VASCULAR INTERVENTION Left 09/04/2020   Procedure: PERIPHERAL VASCULAR INTERVENTION;  Surgeon: Hawken, Thomas N, MD;  Location: [facility];  Service: Cardiovascular;  Laterality: Left;  SFA   TUBAL LIGATION      Social History   Socioeconomic History   Marital status: Single    Spouse name: Not on file   Number of children: Not on file   Years of education: Not on file   Highest education level: Not on file  Occupational History   Not on file  Tobacco Use   Smoking status: Former    Packs/day: 1.00    Years: 40.00    Additional pack years: 0.00    Total pack years: 40.00    Types: Cigarettes    Quit date: 2019    Years since quitting: 5.2   Smokeless tobacco:   Never  Vaping Use   Vaping Use: Never used  Substance and Sexual Activity   Alcohol use: Never   Drug use: Never   Sexual activity: Not on file  Other Topics Concern   Not on file  Social History Narrative   ** Merged History Encounter **   Right handed    Lives with family        Social Determinants of Health   Financial Resource Strain: Not on file  Food Insecurity: No Food Insecurity (09/23/2022)   Hunger Vital Sign    Worried About Running Out of  Food in the Last Year: Never true    Ran Out of Food in the Last Year: Never true  Transportation Needs: No Transportation Needs (09/23/2022)   PRAPARE - Transportation    Lack of Transportation (Medical): No    Lack of Transportation (Non-Medical): No  Physical Activity: Not on file  Stress: Not on file  Social Connections: Not on file  Intimate Partner Violence: Not on file     Allergies  Allergen Reactions   Aspirin     Can not take due to previous GI bleed - per daughter     Outpatient Medications Prior to Visit  Medication Sig Dispense Refill   ACCU-CHEK GUIDE test strip USE TO test AS DIRECTED TWICE DAILY 200 strip 2   acetaminophen (TYLENOL) 500 MG tablet Take 1,000 mg by mouth every 6 (six) hours as needed (pain).     albuterol (PROVENTIL) (2.5 MG/3ML) 0.083% nebulizer solution Inhale 3 mLs (2.5 mg total) into the lungs every 4 (four) hours as needed for wheezing or shortness of breath. 75 mL 12   albuterol (VENTOLIN HFA) 108 (90 Base) MCG/ACT inhaler Inhale 1 puff into the lungs every 6 (six) hours as needed for wheezing or shortness of breath.     amLODipine (NORVASC) 5 MG tablet Take 5 mg by mouth daily.     atorvastatin (LIPITOR) 80 MG tablet Take 1 tablet (80 mg total) by mouth daily at 6 PM. (Patient taking differently: Take 80 mg by mouth every evening.) 30 tablet 0   clopidogrel (PLAVIX) 75 MG tablet Take 1 tablet (75 mg total) by mouth daily. 30 tablet 11   feeding supplement (ENSURE ENLIVE / ENSURE PLUS) LIQD Take 237 mLs by mouth 2 (two) times daily between meals. 237 mL 12   ferrous sulfate 325 (65 FE) MG tablet Take 1 tablet (325 mg total) by mouth daily with breakfast. 30 tablet 3   folic acid (FOLVITE) 1 MG tablet Take 1 tablet (1 mg total) by mouth daily. 30 tablet 2   gabapentin (NEURONTIN) 300 MG capsule Take 1 capsule (300 mg total) by mouth 3 (three) times daily. 30 capsule 3   insulin glargine (LANTUS) 100 UNIT/ML injection Inject 0.15 mLs (15 Units total)  into the skin daily. (Patient taking differently: Inject 25 Units into the skin daily.) 10 mL 11   linagliptin (TRADJENTA) 5 MG TABS tablet Take 5 mg by mouth daily.     pantoprazole (PROTONIX) 40 MG tablet Take 1 tablet (40 mg total) by mouth 2 (two) times daily. 84 tablet 0   No facility-administered medications prior to visit.    Review of Systems  Constitutional:  Negative for chills, fever, malaise/fatigue and weight loss.  HENT:  Negative for hearing loss, sore throat and tinnitus.   Eyes:  Negative for blurred vision and double vision.  Respiratory:  Negative for cough, hemoptysis, sputum production, shortness of breath, wheezing and   stridor.   Cardiovascular:  Negative for chest pain, palpitations, orthopnea, leg swelling and PND.  Gastrointestinal:  Negative for abdominal pain, constipation, diarrhea, heartburn, nausea and vomiting.  Genitourinary:  Negative for dysuria, hematuria and urgency.  Musculoskeletal:  Positive for myalgias. Negative for joint pain.  Skin:  Negative for itching and rash.  Neurological:  Negative for dizziness, tingling, weakness and headaches.  Endo/Heme/Allergies:  Negative for environmental allergies. Does not bruise/bleed easily.  Psychiatric/Behavioral:  Negative for depression. The patient is not nervous/anxious and does not have insomnia.   All other systems reviewed and are negative.    Objective:  Physical Exam Vitals reviewed.  Constitutional:      General: She is not in acute distress.    Appearance: She is well-developed.  HENT:     Head: Normocephalic and atraumatic.  Eyes:     General: No scleral icterus.    Conjunctiva/sclera: Conjunctivae normal.     Pupils: Pupils are equal, round, and reactive to light.  Neck:     Vascular: No JVD.     Trachea: No tracheal deviation.  Cardiovascular:     Rate and Rhythm: Normal rate and regular rhythm.     Heart sounds: Normal heart sounds. No murmur heard. Pulmonary:     Effort:  Pulmonary effort is normal. No tachypnea, accessory muscle usage or respiratory distress.     Breath sounds: No stridor. No wheezing, rhonchi or rales.  Abdominal:     General: There is no distension.     Palpations: Abdomen is soft.     Tenderness: There is no abdominal tenderness.  Musculoskeletal:        General: Deformity present. No tenderness.     Cervical back: Neck supple.  Lymphadenopathy:     Cervical: No cervical adenopathy.  Skin:    General: Skin is warm and dry.     Capillary Refill: Capillary refill takes less than 2 seconds.     Findings: No rash.  Neurological:     Mental Status: She is alert and oriented to person, place, and time.     Motor: Weakness present.     Coordination: Coordination abnormal.     Gait: Gait abnormal.  Psychiatric:        Behavior: Behavior normal.      Vitals:   10/15/22 1522  BP: 100/60  Pulse: 79  SpO2: 93%  Weight: 141 lb (64 kg)  Height: 5' 2" (1.575 m)   93% on RA BMI Readings from Last 3 Encounters:  10/15/22 25.79 kg/m  09/25/22 26.36 kg/m  03/10/22 25.97 kg/m   Wt Readings from Last 3 Encounters:  10/15/22 141 lb (64 kg)  09/25/22 144 lb 2.9 oz (65.4 kg)  03/10/22 142 lb (64.4 kg)     CBC    Component Value Date/Time   WBC 8.5 09/24/2022 0503   RBC 4.05 09/24/2022 0503   RBC 4.03 09/24/2022 0503   HGB 9.0 (L) 09/24/2022 0503   HCT 28.4 (L) 09/24/2022 0503   PLT 282 09/24/2022 0503   MCV 70.1 (L) 09/24/2022 0503   MCH 22.2 (L) 09/24/2022 0503   MCHC 31.7 09/24/2022 0503   RDW 20.3 (H) 09/24/2022 0503   LYMPHSABS 2.3 09/22/2022 1750   MONOABS 0.5 09/22/2022 1750   EOSABS 0.3 09/22/2022 1750   BASOSABS 0.0 09/22/2022 1750     Chest Imaging:  Nuclear medicine pet imaging March 2024: Bilateral hypermetabolic masses within the chest concerning advanced age margin carcinoma. The patient's images have been independently   reviewed by me.    Pulmonary Functions Testing Results:     No data to display           FeNO:   Pathology:   Echocardiogram:   Heart Catheterization:     Assessment & Plan:     ICD-10-CM   1. Lung mass  R91.8 Procedural/ Surgical Case Request: ROBOTIC ASSISTED NAVIGATIONAL BRONCHOSCOPY, VIDEO BRONCHOSCOPY WITH ENDOBRONCHIAL ULTRASOUND    Ambulatory referral to Pulmonology    2. Abnormal PET scan of lung  R94.2       Discussion:  This is a 77-year-old female found to have bilateral lung masses that are hypermetabolic.  Concerning for advanced age bronchogenic carcinoma.  Plan: Today in the office we talked with the patient's H POA which is her daughter as well as her son about next steps. Due to the patient's dementia and history of stroke I do think that she is at moderate risk for general anesthesia. She is on Plavix that will need to be held prior to a potential biopsy. They are not sure about undergoing chemo and radiation but would be considered an option for immunotherapy depending upon tissue biopsy and molecular studies. I will discuss case with medical oncology. We will plan for procedure and biopsy on 10/27/2022. We will need to hold Plavix 1 week prior. Patient is agreeable to proceed/H POA agreeable to proceed.  We appreciate PCC's help with scheduling.   Current Outpatient Medications:    ACCU-CHEK GUIDE test strip, USE TO test AS DIRECTED TWICE DAILY, Disp: 200 strip, Rfl: 2   acetaminophen (TYLENOL) 500 MG tablet, Take 1,000 mg by mouth every 6 (six) hours as needed (pain)., Disp: , Rfl:    albuterol (PROVENTIL) (2.5 MG/3ML) 0.083% nebulizer solution, Inhale 3 mLs (2.5 mg total) into the lungs every 4 (four) hours as needed for wheezing or shortness of breath., Disp: 75 mL, Rfl: 12   albuterol (VENTOLIN HFA) 108 (90 Base) MCG/ACT inhaler, Inhale 1 puff into the lungs every 6 (six) hours as needed for wheezing or shortness of breath., Disp: , Rfl:    amLODipine (NORVASC) 5 MG tablet, Take 5 mg by mouth daily., Disp: , Rfl:     atorvastatin (LIPITOR) 80 MG tablet, Take 1 tablet (80 mg total) by mouth daily at 6 PM. (Patient taking differently: Take 80 mg by mouth every evening.), Disp: 30 tablet, Rfl: 0   clopidogrel (PLAVIX) 75 MG tablet, Take 1 tablet (75 mg total) by mouth daily., Disp: 30 tablet, Rfl: 11   feeding supplement (ENSURE ENLIVE / ENSURE PLUS) LIQD, Take 237 mLs by mouth 2 (two) times daily between meals., Disp: 237 mL, Rfl: 12   ferrous sulfate 325 (65 FE) MG tablet, Take 1 tablet (325 mg total) by mouth daily with breakfast., Disp: 30 tablet, Rfl: 3   folic acid (FOLVITE) 1 MG tablet, Take 1 tablet (1 mg total) by mouth daily., Disp: 30 tablet, Rfl: 2   gabapentin (NEURONTIN) 300 MG capsule, Take 1 capsule (300 mg total) by mouth 3 (three) times daily., Disp: 30 capsule, Rfl: 3   insulin glargine (LANTUS) 100 UNIT/ML injection, Inject 0.15 mLs (15 Units total) into the skin daily. (Patient taking differently: Inject 25 Units into the skin daily.), Disp: 10 mL, Rfl: 11   linagliptin (TRADJENTA) 5 MG TABS tablet, Take 5 mg by mouth daily., Disp: , Rfl:    pantoprazole (PROTONIX) 40 MG tablet, Take 1 tablet (40 mg total) by mouth 2 (two) times daily., Disp: 84   tablet, Rfl: 0  I spent 62 minutes dedicated to the care of this patient on the date of this encounter to include pre-visit review of records, face-to-face time with the patient discussing conditions above, post visit ordering of testing, clinical documentation with the electronic health record, making appropriate referrals as documented, and communicating necessary findings to members of the patients care team.   Ernesteen Mihalic L Olena Willy, DO [facility] 10/15/2022 4:35 PM    

## 2022-10-15 NOTE — Addendum Note (Signed)
Addended by: Alvin Critchley on: 10/15/2022 04:55 PM   Modules accepted: Orders

## 2022-10-15 NOTE — Patient Instructions (Signed)
Thank you for visiting Dr. Valeta Harms at [facility]. Today we recommend the following: Orders Placed This Encounter  Procedures   Procedural/ Surgical Case Request: ROBOTIC ASSISTED NAVIGATIONAL BRONCHOSCOPY, VIDEO BRONCHOSCOPY WITH ENDOBRONCHIAL ULTRASOUND   Ambulatory referral to Pulmonology   Bronchoscopy on 10/27/2022  Return in about 19 days (around 11/03/2022) for with Eric Form, NP.    Please do your part to reduce the spread of COVID-19.

## 2022-10-15 NOTE — Progress Notes (Signed)
Synopsis: Referred in March 2024 for bilateral lung masses by [name],*  Subjective:   PATIENT ID: Crystal Moore GENDER: female DOB: 1946-07-15, MRN: [id number]  Chief Complaint  Patient presents with   Consult    Lung mass.    This is a 77 year old female, past medical history of type 2 diabetes, dementia, stroke longstanding tobacco abuse disorder.  Non-smoker at this time.  She was originally brought to the emergency room for evaluation of seizure-like activity.  She has recovered from this hospitalization but upon ER evaluation she had CT imaging of the chest revealed bilateral masses within the chest.  This was concerning for an advanced age bronchogenic carcinoma.  Brain MR imaging on this admission to the hospital showed no evidence of metastatic disease to the brain.    Past Medical History:  Diagnosis Date   Arthritis    COPD (chronic obstructive pulmonary disease) ([place])    COVID-19 virus infection 07/2020   CVA (cerebral vascular accident) ([place])    Dementia ([place])    Diabetes mellitus without complication ([place])    Hemiparesis affecting left side as late effect of cerebrovascular accident (CVA) ([place])    Pyelonephritis    Seizures ([place])    Stroke ([place])    Tobacco use disorder    UTI (urinary tract infection)      Family History  Problem Relation Age of Onset   Diabetes Mother    Diabetes Sister    Diabetes Brother      Past Surgical History:  Procedure Laterality Date   ABDOMINAL AORTOGRAM W/LOWER EXTREMITY N/A 08/30/2020   Procedure: ABDOMINAL AORTOGRAM W/LOWER EXTREMITY;  Surgeon: Cherre Robins, MD;  Location: [facility];  Service: Cardiovascular;  Laterality: N/A;   ABDOMINAL AORTOGRAM W/LOWER EXTREMITY N/A 09/04/2020   Procedure: ABDOMINAL AORTOGRAM W/LOWER EXTREMITY;  Surgeon: Cherre Robins, MD;  Location: [facility];  Service: Cardiovascular;  Laterality: N/A;   ESOPHAGOGASTRODUODENOSCOPY (EGD) WITH PROPOFOL N/A 09/16/2020    Procedure: ESOPHAGOGASTRODUODENOSCOPY (EGD) WITH PROPOFOL;  Surgeon: Lavena Bullion, DO;  Location: [facility];  Service: Gastroenterology;  Laterality: N/A;   HEMOSTASIS CLIP PLACEMENT  09/16/2020   Procedure: HEMOSTASIS CLIP PLACEMENT;  Surgeon: Lavena Bullion, DO;  Location: [facility];  Service: Gastroenterology;;   HOT HEMOSTASIS N/A 09/16/2020   Procedure: HOT HEMOSTASIS (ARGON PLASMA COAGULATION/BICAP);  Surgeon: Lavena Bullion, DO;  Location: [facility];  Service: Gastroenterology;  Laterality: N/A;   PERIPHERAL VASCULAR INTERVENTION Right 08/30/2020   Procedure: PERIPHERAL VASCULAR INTERVENTION;  Surgeon: Cherre Robins, MD;  Location: [facility];  Service: Cardiovascular;  Laterality: Right;  femoral popliteal   PERIPHERAL VASCULAR INTERVENTION Left 09/04/2020   Procedure: PERIPHERAL VASCULAR INTERVENTION;  Surgeon: Cherre Robins, MD;  Location: [facility];  Service: Cardiovascular;  Laterality: Left;  SFA   TUBAL LIGATION      Social History   Socioeconomic History   Marital status: Single    Spouse name: Not on file   Number of children: Not on file   Years of education: Not on file   Highest education level: Not on file  Occupational History   Not on file  Tobacco Use   Smoking status: Former    Packs/day: 1.00    Years: 40.00    Additional pack years: 0.00    Total pack years: 40.00    Types: Cigarettes    Quit date: 2019    Years since quitting: 5.2   Smokeless tobacco:  Never  Vaping Use   Vaping Use: Never used  Substance and Sexual Activity   Alcohol use: Never   Drug use: Never   Sexual activity: Not on file  Other Topics Concern   Not on file  Social History Narrative   ** Merged History Encounter **   Right handed    Lives with family        Social Determinants of Health   Financial Resource Strain: Not on file  Food Insecurity: No Food Insecurity (09/23/2022)   Hunger Vital Sign    Worried About Running Out of  Food in the Last Year: Never true    Ran Out of Food in the Last Year: Never true  Transportation Needs: No Transportation Needs (09/23/2022)   PRAPARE - Hydrologist (Medical): No    Lack of Transportation (Non-Medical): No  Physical Activity: Not on file  Stress: Not on file  Social Connections: Not on file  Intimate Partner Violence: Not on file     Allergies  Allergen Reactions   Aspirin     Can not take due to previous GI bleed - per daughter     Outpatient Medications Prior to Visit  Medication Sig Dispense Refill   ACCU-CHEK GUIDE test strip USE TO test AS DIRECTED TWICE DAILY 200 strip 2   acetaminophen (TYLENOL) 500 MG tablet Take 1,000 mg by mouth every 6 (six) hours as needed (pain).     albuterol (PROVENTIL) (2.5 MG/3ML) 0.083% nebulizer solution Inhale 3 mLs (2.5 mg total) into the lungs every 4 (four) hours as needed for wheezing or shortness of breath. 75 mL 12   albuterol (VENTOLIN HFA) 108 (90 Base) MCG/ACT inhaler Inhale 1 puff into the lungs every 6 (six) hours as needed for wheezing or shortness of breath.     amLODipine (NORVASC) 5 MG tablet Take 5 mg by mouth daily.     atorvastatin (LIPITOR) 80 MG tablet Take 1 tablet (80 mg total) by mouth daily at 6 PM. (Patient taking differently: Take 80 mg by mouth every evening.) 30 tablet 0   clopidogrel (PLAVIX) 75 MG tablet Take 1 tablet (75 mg total) by mouth daily. 30 tablet 11   feeding supplement (ENSURE ENLIVE / ENSURE PLUS) LIQD Take 237 mLs by mouth 2 (two) times daily between meals. 237 mL 12   ferrous sulfate 325 (65 FE) MG tablet Take 1 tablet (325 mg total) by mouth daily with breakfast. 30 tablet 3   folic acid (FOLVITE) 1 MG tablet Take 1 tablet (1 mg total) by mouth daily. 30 tablet 2   gabapentin (NEURONTIN) 300 MG capsule Take 1 capsule (300 mg total) by mouth 3 (three) times daily. 30 capsule 3   insulin glargine (LANTUS) 100 UNIT/ML injection Inject 0.15 mLs (15 Units total)  into the skin daily. (Patient taking differently: Inject 25 Units into the skin daily.) 10 mL 11   linagliptin (TRADJENTA) 5 MG TABS tablet Take 5 mg by mouth daily.     pantoprazole (PROTONIX) 40 MG tablet Take 1 tablet (40 mg total) by mouth 2 (two) times daily. 84 tablet 0   No facility-administered medications prior to visit.    Review of Systems  Constitutional:  Negative for chills, fever, malaise/fatigue and weight loss.  HENT:  Negative for hearing loss, sore throat and tinnitus.   Eyes:  Negative for blurred vision and double vision.  Respiratory:  Negative for cough, hemoptysis, sputum production, shortness of breath, wheezing and  stridor.   Cardiovascular:  Negative for chest pain, palpitations, orthopnea, leg swelling and PND.  Gastrointestinal:  Negative for abdominal pain, constipation, diarrhea, heartburn, nausea and vomiting.  Genitourinary:  Negative for dysuria, hematuria and urgency.  Musculoskeletal:  Positive for myalgias. Negative for joint pain.  Skin:  Negative for itching and rash.  Neurological:  Negative for dizziness, tingling, weakness and headaches.  Endo/Heme/Allergies:  Negative for environmental allergies. Does not bruise/bleed easily.  Psychiatric/Behavioral:  Negative for depression. The patient is not nervous/anxious and does not have insomnia.   All other systems reviewed and are negative.    Objective:  Physical Exam Vitals reviewed.  Constitutional:      General: She is not in acute distress.    Appearance: She is well-developed.  HENT:     Head: Normocephalic and atraumatic.  Eyes:     General: No scleral icterus.    Conjunctiva/sclera: Conjunctivae normal.     Pupils: Pupils are equal, round, and reactive to light.  Neck:     Vascular: No JVD.     Trachea: No tracheal deviation.  Cardiovascular:     Rate and Rhythm: Normal rate and regular rhythm.     Heart sounds: Normal heart sounds. No murmur heard. Pulmonary:     Effort:  Pulmonary effort is normal. No tachypnea, accessory muscle usage or respiratory distress.     Breath sounds: No stridor. No wheezing, rhonchi or rales.  Abdominal:     General: There is no distension.     Palpations: Abdomen is soft.     Tenderness: There is no abdominal tenderness.  Musculoskeletal:        General: Deformity present. No tenderness.     Cervical back: Neck supple.  Lymphadenopathy:     Cervical: No cervical adenopathy.  Skin:    General: Skin is warm and dry.     Capillary Refill: Capillary refill takes less than 2 seconds.     Findings: No rash.  Neurological:     Mental Status: She is alert and oriented to person, place, and time.     Motor: Weakness present.     Coordination: Coordination abnormal.     Gait: Gait abnormal.  Psychiatric:        Behavior: Behavior normal.      Vitals:   10/15/22 1522  BP: 100/60  Pulse: 79  SpO2: 93%  Weight: 141 lb (64 kg)  Height: 5\' 2"  (1.575 m)   93% on RA BMI Readings from Last 3 Encounters:  10/15/22 25.79 kg/m  09/25/22 26.36 kg/m  03/10/22 25.97 kg/m   Wt Readings from Last 3 Encounters:  10/15/22 141 lb (64 kg)  09/25/22 144 lb 2.9 oz (65.4 kg)  03/10/22 142 lb (64.4 kg)     CBC    Component Value Date/Time   WBC 8.5 09/24/2022 0503   RBC 4.05 09/24/2022 0503   RBC 4.03 09/24/2022 0503   HGB 9.0 (L) 09/24/2022 0503   HCT 28.4 (L) 09/24/2022 0503   PLT 282 09/24/2022 0503   MCV 70.1 (L) 09/24/2022 0503   MCH 22.2 (L) 09/24/2022 0503   MCHC 31.7 09/24/2022 0503   RDW 20.3 (H) 09/24/2022 0503   LYMPHSABS 2.3 09/22/2022 1750   MONOABS 0.5 09/22/2022 1750   EOSABS 0.3 09/22/2022 1750   BASOSABS 0.0 09/22/2022 1750     Chest Imaging:  Nuclear medicine pet imaging March 2024: Bilateral hypermetabolic masses within the chest concerning advanced age margin carcinoma. The patient's images have been independently  reviewed by me.    Pulmonary Functions Testing Results:     No data to display           FeNO:   Pathology:   Echocardiogram:   Heart Catheterization:     Assessment & Plan:     ICD-10-CM   1. Lung mass  R91.8 Procedural/ Surgical Case Request: ROBOTIC ASSISTED NAVIGATIONAL BRONCHOSCOPY, VIDEO BRONCHOSCOPY WITH ENDOBRONCHIAL ULTRASOUND    Ambulatory referral to Pulmonology    2. Abnormal PET scan of lung  R94.2       Discussion:  This is a 77 year old female found to have bilateral lung masses that are hypermetabolic.  Concerning for advanced age bronchogenic carcinoma.  Plan: Today in the office we talked with the patient's H POA which is her daughter as well as her son about next steps. Due to the patient's dementia and history of stroke I do think that she is at moderate risk for general anesthesia. She is on Plavix that will need to be held prior to a potential biopsy. They are not sure about undergoing chemo and radiation but would be considered an option for immunotherapy depending upon tissue biopsy and molecular studies. I will discuss case with medical oncology. We will plan for procedure and biopsy on 10/27/2022. We will need to hold Plavix 1 week prior. Patient is agreeable to proceed/H POA agreeable to proceed.  We appreciate PCC's help with scheduling.   Current Outpatient Medications:    ACCU-CHEK GUIDE test strip, USE TO test AS DIRECTED TWICE DAILY, Disp: 200 strip, Rfl: 2   acetaminophen (TYLENOL) 500 MG tablet, Take 1,000 mg by mouth every 6 (six) hours as needed (pain)., Disp: , Rfl:    albuterol (PROVENTIL) (2.5 MG/3ML) 0.083% nebulizer solution, Inhale 3 mLs (2.5 mg total) into the lungs every 4 (four) hours as needed for wheezing or shortness of breath., Disp: 75 mL, Rfl: 12   albuterol (VENTOLIN HFA) 108 (90 Base) MCG/ACT inhaler, Inhale 1 puff into the lungs every 6 (six) hours as needed for wheezing or shortness of breath., Disp: , Rfl:    amLODipine (NORVASC) 5 MG tablet, Take 5 mg by mouth daily., Disp: , Rfl:     atorvastatin (LIPITOR) 80 MG tablet, Take 1 tablet (80 mg total) by mouth daily at 6 PM. (Patient taking differently: Take 80 mg by mouth every evening.), Disp: 30 tablet, Rfl: 0   clopidogrel (PLAVIX) 75 MG tablet, Take 1 tablet (75 mg total) by mouth daily., Disp: 30 tablet, Rfl: 11   feeding supplement (ENSURE ENLIVE / ENSURE PLUS) LIQD, Take 237 mLs by mouth 2 (two) times daily between meals., Disp: 237 mL, Rfl: 12   ferrous sulfate 325 (65 FE) MG tablet, Take 1 tablet (325 mg total) by mouth daily with breakfast., Disp: 30 tablet, Rfl: 3   folic acid (FOLVITE) 1 MG tablet, Take 1 tablet (1 mg total) by mouth daily., Disp: 30 tablet, Rfl: 2   gabapentin (NEURONTIN) 300 MG capsule, Take 1 capsule (300 mg total) by mouth 3 (three) times daily., Disp: 30 capsule, Rfl: 3   insulin glargine (LANTUS) 100 UNIT/ML injection, Inject 0.15 mLs (15 Units total) into the skin daily. (Patient taking differently: Inject 25 Units into the skin daily.), Disp: 10 mL, Rfl: 11   linagliptin (TRADJENTA) 5 MG TABS tablet, Take 5 mg by mouth daily., Disp: , Rfl:    pantoprazole (PROTONIX) 40 MG tablet, Take 1 tablet (40 mg total) by mouth 2 (two) times daily., Disp: 21  tablet, Rfl: 0  I spent 62 minutes dedicated to the care of this patient on the date of this encounter to include pre-visit review of records, face-to-face time with the patient discussing conditions above, post visit ordering of testing, clinical documentation with the electronic health record, making appropriate referrals as documented, and communicating necessary findings to members of the patients care team.   Garner Nash, DO [facility] 10/15/2022 4:35 PM

## 2022-10-16 DIAGNOSIS — I10 Essential (primary) hypertension: Secondary | ICD-10-CM | POA: Diagnosis not present

## 2022-10-16 DIAGNOSIS — E1139 Type 2 diabetes mellitus with other diabetic ophthalmic complication: Secondary | ICD-10-CM | POA: Diagnosis not present

## 2022-10-16 DIAGNOSIS — J449 Chronic obstructive pulmonary disease, unspecified: Secondary | ICD-10-CM | POA: Diagnosis not present

## 2022-10-22 ENCOUNTER — Encounter (HOSPITAL_COMMUNITY): Payer: Self-pay | Admitting: Pulmonary Disease

## 2022-10-22 NOTE — Progress Notes (Signed)
PCP - Dr [name] Cardiologist - none Pulmonology - June Leap, DO  CT Chest x-ray - 10/13/22 EKG - 09/22/22 Stress Test - n/a ECHO Limited - 09/23/22 Cardiac Cath - n/a  ICD Pacemaker/Loop - n/a  Sleep Study -  n/a CPAP - none  Diabetes Type 2 Do not take Tradjenta on the morning of surgery.      THE MORNING OF SURGERY, take 12 Units of Lantus Insulin..  If your blood sugar is less than 70 mg/dL, you will need to treat for low blood sugar: Treat a low blood sugar (less than 70 mg/dL) with  cup of clear juice (cranberry or apple), 4 glucose tablets, OR glucose gel. Recheck blood sugar in 15 minutes after treatment (to make sure it is greater than 70 mg/dL). If your blood sugar is not greater than 70 mg/dL on recheck, call [phone] for further instructions.  Blood Thinner Instructions:  Hold for 5 days prior per MD.  Last dose of Plavix was on 10/19/22.  Aspirin Instructions: n/a  NPO  Anesthesia review: Yes  STOP now taking any Aspirin (unless otherwise instructed by your surgeon), Aleve, Naproxen, Ibuprofen, Motrin, Advil, Goody's, BC's, all herbal medications, fish oil, and all vitamins.   Coronavirus Screening Do you have any of the following symptoms:  Cough yes/no: No Fever (>100.35F)  yes/no: No Runny nose yes/no: No Sore throat yes/no: No Difficulty breathing/shortness of breath  yes/no: No  Have you traveled in the last 14 days and where? yes/no: No  Daughter [name] [phone]) verbalized understanding of instructions that were given via phone.  Patient's aide Sharyn Lull will be with patient on DOS.

## 2022-10-23 NOTE — Anesthesia Preprocedure Evaluation (Signed)
Anesthesia Evaluation  Patient identified by MRN, date of birth, ID band  Reviewed: Allergy & Precautions, NPO status , Patient's Chart, lab work & pertinent test results  Airway Mallampati: II  TM Distance: >3 FB Neck ROM: Full    Dental  (+) Edentulous Upper, Edentulous Lower   Pulmonary COPD, former smoker H/o COVID-19   Pulmonary exam normal breath sounds clear to auscultation       Cardiovascular Exercise Tolerance: Good hypertension, Pt. on medications + Peripheral Vascular Disease  Normal cardiovascular exam Rhythm:Regular Rate:Normal  14-Sep-2020 17:10:01 [facility]/ED ROUTINE RECORD Sinus rhythm new Nonspecific repol abnormality, diffuse leads Confirmed by Blanchie Dessert [phone]) on 09/14/2020 5:12:41 PM   Neuro/Psych Seizures -,  PSYCHIATRIC DISORDERS  Depression    TIACVA (left hemiparesis), Residual Symptoms    GI/Hepatic Neg liver ROS,GERD  Medicated and Controlled,,  Endo/Other  diabetes, Type 2, Insulin Dependent    Renal/GU Renal disease  negative genitourinary   Musculoskeletal  (+) Arthritis , Osteoarthritis,    Abdominal   Peds negative pediatric ROS (+)  Hematology   Anesthesia Other Findings . Left ventricular ejection fraction, by estimation, is 65 to 70%. The  left ventricle has normal function. The left ventricle has no regional  wall motion abnormalities. Left ventricular diastolic function could not  be evaluated.   2. Right ventricular systolic function is normal. The right ventricular  size is normal.   3. The mitral valve is normal in structure. No evidence of mitral valve  regurgitation. No evidence of mitral stenosis.   4. The aortic valve is tricuspid. Aortic valve regurgitation is not  visualized.   5    Reproductive/Obstetrics                             Anesthesia Physical Anesthesia Plan  ASA: III  Anesthesia Plan: General    Post-op Pain Management:    Induction: Intravenous  PONV Risk Score and Plan: 3 and Treatment may vary due to age or medical condition and Ondansetron  Airway Management Planned: Oral ETT  Additional Equipment: None  Intra-op Plan:   Post-operative Plan:   Informed Consent: I have reviewed the patients History and Physical, chart, labs and discussed the procedure including the risks, benefits and alternatives for the proposed anesthesia with the patient or authorized representative who has indicated his/her understanding and acceptance.       Plan Discussed with: Anesthesiologist and CRNA  Anesthesia Plan Comments: (  Patient was also incidentally discovered to have new bilateral pulmonary masses worrisome for neoplasm/metastatic disease (unknown primary).  No metastatic disease to the brain on MRI.Marland Kitchen  She was previously a heavy smoker.  Outpatient pulmonology follow-up was arranged.  Patient seen by pulmonologist Dr. Valeta Harms on 10/15/2022.  Per note, "Today in the office we talked with the patient's H POA which is her daughter proceed/H POA agreeable to proceed."  Patient will need day of surgery labs and evaluation.  Reviewed history with anesthesiologist Dr. Jillyn Hidden.  Advised okay to proceed as planned barring acute status change.  EKG 09/22/2022: Sinus rhythm.  Rate 74.  PAC.  Nonspecific T abnormalities, lateral leads.  NM PET super D CT 10/13/2022: IMPRESSION: 1. Stable bilateral pulmonary masses, largest is located in the left upper lobe. 2. Stable left hilar adenopathy. 3. Peribronchovascular reticular opacities and traction bronchiectasis, likely due to fibrotic interstitial lung disease. 4. Aortic Atherosclerosis (ICD10-I70.0) and Emphysema (ICD10-J43.9).  TTE 09/23/2022: 1. Left ventricular  ejection fraction, by estimation, is 65 to 70%. The  left ventricle has normal function. The left ventricle has no regional  wall motion abnormalities. Left ventricular  diastolic function could not  be evaluated.  2. Right ventricular systolic function is normal. The right ventricular  size is normal.  3. The mitral valve is normal in structure. No evidence of mitral valve  regurgitation. No evidence of mitral stenosis.  4. The aortic valve is tricuspid. Aortic valve regurgitation is not  visualized.  5. Limited echo as only parasternal images obtained due to patient's  combativeness. If more information needed, consider repeat echo when  patient able to be more comlaint with exam.     )        Anesthesia Quick Evaluation

## 2022-10-23 NOTE — Progress Notes (Signed)
Anesthesia Chart Review: Same-day workup  77 y.o. female with medical history significant of COPD, chronic respiratory failure with hypoxia on supplemental O2, multiple prior strokes with residual left-sided weakness, dementia, insulin-dependent type 2 diabetes (A1c 7.4 on 09/22/2022), seizures, PVD status post bilateral stenting.  Recent admission 2/27 through 09/25/2022.  Patient presented to the ED via EMS as code stroke for not acting like herself and having difficulty communicating.  Echo was a limited study due to patient combativeness, normal EF noted.  She was evaluated by neurology.  Per note by Dr. Leonie Man on 09/25/2022, "She presented with altered mental status and slurred speech and MRI is negative for acute stroke. Possibly unwitnessed seizure with post ictal confusion versus TIA. Given her poor baseline status and diagnosis of cancer recommend palliative care approach only. D/w  Dr [name].  Stroke team will sign off.  Kindly call for questions ."  Patient was also incidentally discovered to have new bilateral pulmonary masses worrisome for neoplasm/metastatic disease (unknown primary).  No metastatic disease to the brain on MRI.Marland Kitchen  She was previously a heavy smoker.  Outpatient pulmonology follow-up was arranged.  Patient seen by pulmonologist Dr. Valeta Harms on 10/15/2022.  Per note, "Today in the office we talked with the patient's H POA which is her daughter as well as her son about next steps. Due to the patient's dementia and history of stroke I do think that she is at moderate risk for general anesthesia. She is on Plavix that will need to be held prior to a potential biopsy. They are not sure about undergoing chemo and radiation but would be considered an option for immunotherapy depending upon tissue biopsy and molecular studies. I will discuss case with medical oncology. We will plan for procedure and biopsy on 10/27/2022. We will need to hold Plavix 1 week prior. Patient is agreeable to proceed/H POA  agreeable to proceed."  Patient will need day of surgery labs and evaluation.  Reviewed history with anesthesiologist Dr. Jillyn Hidden.  Advised okay to proceed as planned barring acute status change.  EKG 09/22/2022: Sinus rhythm.  Rate 74.  PAC.  Nonspecific T abnormalities, lateral leads.  NM PET super D CT 10/13/2022: IMPRESSION: 1. Stable bilateral pulmonary masses, largest is located in the left upper lobe. 2. Stable left hilar adenopathy. 3. Peribronchovascular reticular opacities and traction bronchiectasis, likely due to fibrotic interstitial lung disease. 4. Aortic Atherosclerosis (ICD10-I70.0) and Emphysema (ICD10-J43.9).  TTE 09/23/2022:  1. Left ventricular ejection fraction, by estimation, is 65 to 70%. The  left ventricle has normal function. The left ventricle has no regional  wall motion abnormalities. Left ventricular diastolic function could not  be evaluated.   2. Right ventricular systolic function is normal. The right ventricular  size is normal.   3. The mitral valve is normal in structure. No evidence of mitral valve  regurgitation. No evidence of mitral stenosis.   4. The aortic valve is tricuspid. Aortic valve regurgitation is not  visualized.   5. Limited echo as only parasternal images obtained due to patient's  combativeness. If more information needed, consider repeat echo when  patient able to be more comlaint with exam.     Wynonia Musty [facility] Short Stay Center/Anesthesiology Phone [phone] 10/23/2022 9:50 AM

## 2022-10-26 DIAGNOSIS — J449 Chronic obstructive pulmonary disease, unspecified: Secondary | ICD-10-CM | POA: Diagnosis not present

## 2022-10-26 DIAGNOSIS — I69354 Hemiplegia and hemiparesis following cerebral infarction affecting left non-dominant side: Secondary | ICD-10-CM | POA: Diagnosis not present

## 2022-10-26 DIAGNOSIS — R531 Weakness: Secondary | ICD-10-CM | POA: Diagnosis not present

## 2022-10-26 DIAGNOSIS — E1139 Type 2 diabetes mellitus with other diabetic ophthalmic complication: Secondary | ICD-10-CM | POA: Diagnosis not present

## 2022-10-26 DIAGNOSIS — I6350 Cerebral infarction due to unspecified occlusion or stenosis of unspecified cerebral artery: Secondary | ICD-10-CM | POA: Diagnosis not present

## 2022-10-27 ENCOUNTER — Ambulatory Visit (HOSPITAL_BASED_OUTPATIENT_CLINIC_OR_DEPARTMENT_OTHER): Payer: 59 | Admitting: Physician Assistant

## 2022-10-27 ENCOUNTER — Ambulatory Visit (HOSPITAL_COMMUNITY): Payer: 59 | Admitting: Physician Assistant

## 2022-10-27 ENCOUNTER — Other Ambulatory Visit: Payer: 59

## 2022-10-27 ENCOUNTER — Ambulatory Visit (HOSPITAL_COMMUNITY): Payer: 59

## 2022-10-27 ENCOUNTER — Ambulatory Visit (HOSPITAL_COMMUNITY)
Admission: RE | Admit: 2022-10-27 | Discharge: 2022-10-27 | Disposition: A | Discharge status: Home / Self Care | Payer: 59 | Attending: Pulmonary Disease | Admitting: Pulmonary Disease

## 2022-10-27 ENCOUNTER — Encounter (HOSPITAL_COMMUNITY)
Admission: RE | Disposition: A | Discharge status: Home / Self Care | Payer: Self-pay | Source: Home / Self Care | Attending: Pulmonary Disease

## 2022-10-27 ENCOUNTER — Inpatient Hospital Stay: Payer: 59 | Admitting: Pulmonary Disease

## 2022-10-27 ENCOUNTER — Other Ambulatory Visit: Payer: Self-pay

## 2022-10-27 ENCOUNTER — Encounter (HOSPITAL_COMMUNITY): Payer: Self-pay | Admitting: Pulmonary Disease

## 2022-10-27 DIAGNOSIS — I10 Essential (primary) hypertension: Secondary | ICD-10-CM | POA: Diagnosis not present

## 2022-10-27 DIAGNOSIS — E119 Type 2 diabetes mellitus without complications: Secondary | ICD-10-CM | POA: Diagnosis not present

## 2022-10-27 DIAGNOSIS — C771 Secondary and unspecified malignant neoplasm of intrathoracic lymph nodes: Secondary | ICD-10-CM | POA: Diagnosis not present

## 2022-10-27 DIAGNOSIS — J449 Chronic obstructive pulmonary disease, unspecified: Secondary | ICD-10-CM | POA: Insufficient documentation

## 2022-10-27 DIAGNOSIS — K219 Gastro-esophageal reflux disease without esophagitis: Secondary | ICD-10-CM | POA: Diagnosis not present

## 2022-10-27 DIAGNOSIS — Z87891 Personal history of nicotine dependence: Secondary | ICD-10-CM | POA: Insufficient documentation

## 2022-10-27 DIAGNOSIS — R942 Abnormal results of pulmonary function studies: Secondary | ICD-10-CM | POA: Diagnosis not present

## 2022-10-27 DIAGNOSIS — Z833 Family history of diabetes mellitus: Secondary | ICD-10-CM | POA: Insufficient documentation

## 2022-10-27 DIAGNOSIS — E1151 Type 2 diabetes mellitus with diabetic peripheral angiopathy without gangrene: Secondary | ICD-10-CM | POA: Diagnosis not present

## 2022-10-27 DIAGNOSIS — C801 Malignant (primary) neoplasm, unspecified: Secondary | ICD-10-CM | POA: Diagnosis not present

## 2022-10-27 DIAGNOSIS — R918 Other nonspecific abnormal finding of lung field: Secondary | ICD-10-CM | POA: Diagnosis not present

## 2022-10-27 DIAGNOSIS — R846 Abnormal cytological findings in specimens from respiratory organs and thorax: Secondary | ICD-10-CM | POA: Insufficient documentation

## 2022-10-27 DIAGNOSIS — Z8616 Personal history of COVID-19: Secondary | ICD-10-CM | POA: Insufficient documentation

## 2022-10-27 DIAGNOSIS — Z794 Long term (current) use of insulin: Secondary | ICD-10-CM | POA: Insufficient documentation

## 2022-10-27 DIAGNOSIS — C3490 Malignant neoplasm of unspecified part of unspecified bronchus or lung: Secondary | ICD-10-CM | POA: Diagnosis not present

## 2022-10-27 DIAGNOSIS — R590 Localized enlarged lymph nodes: Secondary | ICD-10-CM | POA: Insufficient documentation

## 2022-10-27 DIAGNOSIS — F0390 Unspecified dementia without behavioral disturbance: Secondary | ICD-10-CM | POA: Insufficient documentation

## 2022-10-27 DIAGNOSIS — R569 Unspecified convulsions: Secondary | ICD-10-CM | POA: Diagnosis not present

## 2022-10-27 DIAGNOSIS — R0902 Hypoxemia: Secondary | ICD-10-CM

## 2022-10-27 DIAGNOSIS — C3412 Malignant neoplasm of upper lobe, left bronchus or lung: Secondary | ICD-10-CM | POA: Diagnosis present

## 2022-10-27 DIAGNOSIS — I69354 Hemiplegia and hemiparesis following cerebral infarction affecting left non-dominant side: Secondary | ICD-10-CM | POA: Insufficient documentation

## 2022-10-27 DIAGNOSIS — Z48813 Encounter for surgical aftercare following surgery on the respiratory system: Secondary | ICD-10-CM | POA: Diagnosis not present

## 2022-10-27 HISTORY — DX: Peripheral vascular disease, unspecified: I73.9

## 2022-10-27 HISTORY — DX: Dependence on wheelchair: Z99.3

## 2022-10-27 HISTORY — DX: Unspecified sensorineural hearing loss: H90.5

## 2022-10-27 HISTORY — PX: VIDEO BRONCHOSCOPY WITH ENDOBRONCHIAL ULTRASOUND: SHX6177

## 2022-10-27 HISTORY — PX: FINE NEEDLE ASPIRATION: SHX5430

## 2022-10-27 HISTORY — DX: Unspecified abnormalities of gait and mobility: R26.9

## 2022-10-27 HISTORY — DX: Pneumonia, unspecified organism: J18.9

## 2022-10-27 HISTORY — DX: Headache, unspecified: R51.9

## 2022-10-27 HISTORY — DX: Depression, unspecified: F32.A

## 2022-10-27 HISTORY — DX: Other nonspecific abnormal finding of lung field: R91.8

## 2022-10-27 HISTORY — DX: Essential (primary) hypertension: I10

## 2022-10-27 HISTORY — DX: Gastro-esophageal reflux disease without esophagitis: K21.9

## 2022-10-27 LAB — GLUCOSE, CAPILLARY
Glucose-Capillary: 86 mg/dL (ref 70–99)
Glucose-Capillary: 83 mg/dL (ref 70–99)
Glucose-Capillary: 102 mg/dL — ABNORMAL HIGH (ref 70–99)

## 2022-10-27 LAB — CBC
HCT: 38.3 % (ref 36.0–46.0)
Hemoglobin: 11.5 g/dL — ABNORMAL LOW (ref 12.0–15.0)
MCH: 23 pg — ABNORMAL LOW (ref 26.0–34.0)
MCHC: 30 g/dL (ref 30.0–36.0)
MCV: 76.6 fL — ABNORMAL LOW (ref 80.0–100.0)
Platelets: 290 10*3/uL (ref 150–400)
RBC: 5 MIL/uL (ref 3.87–5.11)
RDW: 23.9 % — ABNORMAL HIGH (ref 11.5–15.5)
WBC: 10.2 10*3/uL (ref 4.0–10.5)
nRBC: 0 % (ref 0.0–0.2)

## 2022-10-27 LAB — SARS CORONAVIRUS 2 BY RT PCR: SARS Coronavirus 2 by RT PCR: NEGATIVE

## 2022-10-27 LAB — BASIC METABOLIC PANEL
Anion gap: 12 (ref 5–15)
BUN: 18 mg/dL (ref 8–23)
CO2: 21 mmol/L — ABNORMAL LOW (ref 22–32)
Calcium: 9.1 mg/dL (ref 8.9–10.3)
Chloride: 105 mmol/L (ref 98–111)
Creatinine, Ser: 0.75 mg/dL (ref 0.44–1.00)
GFR, Estimated: >60 mL/min (ref >60)
Glucose, Bld: 81 mg/dL (ref 70–99)
Potassium: 4.4 mmol/L (ref 3.5–5.1)
Sodium: 138 mmol/L (ref 135–145)

## 2022-10-27 SURGERY — BRONCHOSCOPY, WITH EBUS
Anesthesia: General | Laterality: Bilateral

## 2022-10-27 MED ORDER — FENTANYL CITRATE (PF) 250 MCG/5ML IJ SOLN
INTRAMUSCULAR | Status: DC | PRN
  Administered 2022-10-27: 50 ug via INTRAVENOUS

## 2022-10-27 MED ORDER — ROCURONIUM BROMIDE 10 MG/ML (PF) SYRINGE
PREFILLED_SYRINGE | INTRAVENOUS | Status: DC | PRN
  Administered 2022-10-27: 40 mg via INTRAVENOUS

## 2022-10-27 MED ORDER — POLYETHYLENE GLYCOL 3350 17 G PO PACK
17.0000 g | PACK | Freq: Every day | ORAL | Status: DC | PRN
Start: 2022-10-27 — End: 2022-10-27

## 2022-10-27 MED ORDER — FERROUS SULFATE 325 (65 FE) MG PO TABS: 325.0000 mg | ORAL_TABLET | Freq: Every day | ORAL | Status: DC

## 2022-10-27 MED ORDER — ARFORMOTEROL TARTRATE 15 MCG/2ML IN NEBU: 15.0000 ug | INHALATION_SOLUTION | Freq: Two times a day (BID) | RESPIRATORY_TRACT | Status: DC

## 2022-10-27 MED ORDER — LACTATED RINGERS IV SOLN: INTRAVENOUS | Status: DC

## 2022-10-27 MED ORDER — PHENYLEPHRINE HCL-NACL 20-0.9 MG/250ML-% IV SOLN
INTRAVENOUS | Status: DC | PRN
  Administered 2022-10-27: 75 ug/min via INTRAVENOUS

## 2022-10-27 MED ORDER — PANTOPRAZOLE SODIUM 40 MG PO TBEC: 40.0000 mg | DELAYED_RELEASE_TABLET | Freq: Two times a day (BID) | ORAL | Status: DC

## 2022-10-27 MED ORDER — ONDANSETRON HCL 4 MG/2ML IJ SOLN
4.0000 mg | Freq: Four times a day (QID) | INTRAMUSCULAR | Status: DC | PRN
Start: 2022-10-27 — End: 2022-10-27

## 2022-10-27 MED ORDER — VASOPRESSIN 20 UNIT/ML IV SOLN
INTRAVENOUS | Status: DC | PRN
  Administered 2022-10-27 (×2): 2 [IU] via INTRAVENOUS

## 2022-10-27 MED ORDER — FOLIC ACID 1 MG PO TABS: 1.0000 mg | ORAL_TABLET | Freq: Every day | ORAL | Status: DC

## 2022-10-27 MED ORDER — ACETAMINOPHEN 500 MG PO TABS: 1000.0000 mg | ORAL_TABLET | Freq: Four times a day (QID) | ORAL | Status: DC | PRN

## 2022-10-27 MED ORDER — IPRATROPIUM-ALBUTEROL 0.5-2.5 (3) MG/3ML IN SOLN
RESPIRATORY_TRACT | Status: AC
  Filled 2022-10-27: qty 3

## 2022-10-27 MED ORDER — INSULIN ASPART 100 UNIT/ML IJ SOLN
0.0000 [IU] | Freq: Every day | INTRAMUSCULAR | Status: DC
Start: 2022-10-27 — End: 2022-10-27

## 2022-10-27 MED ORDER — ONDANSETRON HCL 4 MG/2ML IJ SOLN
INTRAMUSCULAR | Status: DC | PRN
  Administered 2022-10-27: 4 mg via INTRAVENOUS

## 2022-10-27 MED ORDER — PROPOFOL 10 MG/ML IV BOLUS
INTRAVENOUS | Status: DC | PRN
  Administered 2022-10-27: 150 mg via INTRAVENOUS
  Administered 2022-10-27: 75 ug/kg/min via INTRAVENOUS

## 2022-10-27 MED ORDER — OXYCODONE HCL 5 MG/5ML PO SOLN: 5.0000 mg | Freq: Once | ORAL | Status: DC | PRN

## 2022-10-27 MED ORDER — GABAPENTIN 300 MG PO CAPS: 300.0000 mg | ORAL_CAPSULE | Freq: Three times a day (TID) | ORAL | Status: DC

## 2022-10-27 MED ORDER — ATORVASTATIN CALCIUM 80 MG PO TABS: 80.0000 mg | ORAL_TABLET | Freq: Every evening | ORAL | Status: DC

## 2022-10-27 MED ORDER — IPRATROPIUM-ALBUTEROL 0.5-2.5 (3) MG/3ML IN SOLN: 3.0000 mL | Freq: Once | RESPIRATORY_TRACT | Status: DC

## 2022-10-27 MED ORDER — ACETAMINOPHEN 325 MG PO TABS: 325.0000 mg | ORAL_TABLET | ORAL | Status: DC | PRN

## 2022-10-27 MED ORDER — INSULIN ASPART 100 UNIT/ML IJ SOLN
0.0000 [IU] | Freq: Three times a day (TID) | INTRAMUSCULAR | Status: DC
Start: 2022-10-27 — End: 2022-10-27

## 2022-10-27 MED ORDER — REVEFENACIN 175 MCG/3ML IN SOLN: 175.0000 ug | Freq: Every day | RESPIRATORY_TRACT | Status: DC

## 2022-10-27 MED ORDER — DOCUSATE SODIUM 100 MG PO CAPS
100.0000 mg | ORAL_CAPSULE | Freq: Two times a day (BID) | ORAL | Status: DC | PRN
Start: 2022-10-27 — End: 2022-10-27

## 2022-10-27 MED ORDER — LIDOCAINE 2% (20 MG/ML) 5 ML SYRINGE
INTRAMUSCULAR | Status: DC | PRN
  Administered 2022-10-27: 100 mg via INTRAVENOUS

## 2022-10-27 MED ORDER — OXYCODONE HCL 5 MG PO TABS: 5.0000 mg | ORAL_TABLET | Freq: Once | ORAL | Status: DC | PRN

## 2022-10-27 MED ORDER — ALBUTEROL SULFATE (2.5 MG/3ML) 0.083% IN NEBU: 2.5000 mg | INHALATION_SOLUTION | RESPIRATORY_TRACT | Status: DC | PRN

## 2022-10-27 MED ORDER — CHLORHEXIDINE GLUCONATE 0.12 % MT SOLN
OROMUCOSAL | Status: AC
  Administered 2022-10-27: 15 mL
  Filled 2022-10-27: qty 15

## 2022-10-27 MED ORDER — FENTANYL CITRATE (PF) 100 MCG/2ML IJ SOLN: 25.0000 ug | INTRAMUSCULAR | Status: DC | PRN

## 2022-10-27 MED ORDER — PHENYLEPHRINE 80 MCG/ML (10ML) SYRINGE FOR IV PUSH (FOR BLOOD PRESSURE SUPPORT)
PREFILLED_SYRINGE | INTRAVENOUS | Status: DC | PRN
  Administered 2022-10-27: 160 ug via INTRAVENOUS
  Administered 2022-10-27: 80 ug via INTRAVENOUS
  Administered 2022-10-27 (×2): 160 ug via INTRAVENOUS
  Administered 2022-10-27: 80 ug via INTRAVENOUS
  Administered 2022-10-27: 160 ug via INTRAVENOUS

## 2022-10-27 MED ORDER — INSULIN ASPART 100 UNIT/ML IJ SOLN: 0.0000 [IU] | INTRAMUSCULAR | Status: DC | PRN

## 2022-10-27 MED ORDER — BUDESONIDE 0.25 MG/2ML IN SUSP: 0.2500 mg | Freq: Two times a day (BID) | RESPIRATORY_TRACT | Status: DC

## 2022-10-27 MED ORDER — AMLODIPINE BESYLATE 5 MG PO TABS: 5.0000 mg | ORAL_TABLET | Freq: Every morning | ORAL | Status: DC

## 2022-10-27 MED ORDER — SUGAMMADEX SODIUM 200 MG/2ML IV SOLN
INTRAVENOUS | Status: DC | PRN
  Administered 2022-10-27: 200 mg via INTRAVENOUS

## 2022-10-27 MED ORDER — ACETAMINOPHEN 160 MG/5ML PO SOLN: 325.0000 mg | ORAL | Status: DC | PRN

## 2022-10-27 SURGICAL SUPPLY — 29 items
BRUSH CYTOL CELLEBRITY 1.5X140 (MISCELLANEOUS) IMPLANT
CANISTER SUCT 3000ML PPV (MISCELLANEOUS) ×2 IMPLANT
CONT SPEC 4OZ CLIKSEAL STRL BL (MISCELLANEOUS) ×2 IMPLANT
COVER BACK TABLE 60X90IN (DRAPES) ×2 IMPLANT
COVER DOME SNAP 22 D (MISCELLANEOUS) ×2 IMPLANT
FORCEPS BIOP RJ4 1.8 (CUTTING FORCEPS) IMPLANT
GAUZE SPONGE 4X4 12PLY STRL (GAUZE/BANDAGES/DRESSINGS) ×2 IMPLANT
GLOVE BIO SURGEON STRL SZ7.5 (GLOVE) ×2 IMPLANT
GOWN STRL REUS W/ TWL LRG LVL3 (GOWN DISPOSABLE) ×2 IMPLANT
GOWN STRL REUS W/TWL LRG LVL3 (GOWN DISPOSABLE) ×2
KIT CLEAN ENDO COMPLIANCE (KITS) ×4 IMPLANT
KIT TURNOVER KIT B (KITS) ×2 IMPLANT
MARKER SKIN DUAL TIP RULER LAB (MISCELLANEOUS) ×2 IMPLANT
NDL EBUS SONO TIP PENTAX (NEEDLE) ×2 IMPLANT
NEEDLE EBUS SONO TIP PENTAX (NEEDLE) ×2 IMPLANT
NS IRRIG 1000ML POUR BTL (IV SOLUTION) ×2 IMPLANT
OIL SILICONE PENTAX (PARTS (SERVICE/REPAIRS)) ×2 IMPLANT
PAD ARMBOARD 7.5X6 YLW CONV (MISCELLANEOUS) ×4 IMPLANT
SOL ANTI FOG 6CC (MISCELLANEOUS) ×2 IMPLANT
SYR 20CC LL (SYRINGE) ×4 IMPLANT
SYR 20ML ECCENTRIC (SYRINGE) ×4 IMPLANT
SYR 50ML SLIP (SYRINGE) IMPLANT
SYR 5ML LUER SLIP (SYRINGE) ×2 IMPLANT
TOWEL OR 17X24 6PK STRL BLUE (TOWEL DISPOSABLE) ×2 IMPLANT
TRAP SPECIMEN MUCOUS 40CC (MISCELLANEOUS) IMPLANT
TUBE CONNECTING 20X1/4 (TUBING) ×4 IMPLANT
UNDERPAD 30X30 (UNDERPADS AND DIAPERS) ×2 IMPLANT
VALVE DISPOSABLE (MISCELLANEOUS) ×2 IMPLANT
WATER STERILE IRR 1000ML POUR (IV SOLUTION) ×2 IMPLANT

## 2022-10-27 NOTE — Progress Notes (Addendum)
Incentive spirometer and pulm toilet continued with pt. Pt unable to maintain O2 sat >90 % on room air. MD Icard made aware and at bedside. Pt's daughter updated and at bedside with pt. Pt resting comfortably, no SOB or increased WOB. O2 sat 94 on 2L [place].

## 2022-10-27 NOTE — Progress Notes (Signed)
Patient Saturations on Room Air at Rest = 86%  Patient Saturations on [employer] while Ambulating = 82%  Patient Saturations on 2 Liters of oxygen while at rest: 94% Patient Saturations on 3 Liters of oxygen while Ambulating = 92%   Please briefly explain why patient needs home oxygen:  Patient requires home oxygen because she is hypoxemic, to prevent desaturations noted to be more active, increase activity, decrease risk of severe complications of hypoxemia

## 2022-10-27 NOTE — Anesthesia Procedure Notes (Signed)
Procedure Name: Intubation Date/Time: 10/27/2022 1:49 PM  Performed by: Ester Rink, CRNAPre-anesthesia Checklist: Patient identified, Emergency Drugs available, Suction available and Patient being monitored Patient Re-evaluated:Patient Re-evaluated prior to induction Oxygen Delivery Method: Circle system utilized Preoxygenation: Pre-oxygenation with 100% oxygen Induction Type: IV induction Ventilation: Mask ventilation without difficulty Laryngoscope Size: Mac and 4 Grade View: Grade I Tube type: Oral Tube size: 8.5 mm Number of attempts: 1 Airway Equipment and Method: Stylet and Oral airway Placement Confirmation: ETT inserted through vocal cords under direct vision, positive ETCO2 and breath sounds checked- equal and bilateral Secured at: 22 cm Tube secured with: Tape Dental Injury: Teeth and Oropharynx as per pre-operative assessment

## 2022-10-27 NOTE — Discharge Instructions (Signed)

## 2022-10-27 NOTE — Op Note (Signed)
Video Bronchoscopy with Endobronchial Ultrasound Procedure Note  Date of Operation: 10/27/2022  Pre-op Diagnosis: Mediastinal adenopathy, bilateral lung mass  Post-op Diagnosis: Mediastinal adenopathy, bilateral lung mass  Surgeon: Garner Nash, DO  Assistants: None   Anesthesia: General endotracheal anesthesia  Operation: Flexible video fiberoptic bronchoscopy with endobronchial ultrasound and biopsies.  Estimated Blood Loss: Minimal  Complications: None   Indications and History: Crystal Moore is a 77 y.o. female with mediastinal adenopathy, bilateral lung mass.  The risks, benefits, complications, treatment options and expected outcomes were discussed with the patient.  The possibilities of pneumothorax, pneumonia, reaction to medication, pulmonary aspiration, perforation of a viscus, bleeding, failure to diagnose a condition and creating a complication requiring transfusion or operation were discussed with the patient who freely signed the consent.    Description of Procedure: The patient was examined in the preoperative area and history and data from the preprocedure consultation were reviewed. It was deemed appropriate to proceed.  The patient was taken to [facility] endoscopy room 3, identified as Crystal Moore and the procedure verified as Flexible Video Fiberoptic Bronchoscopy.  A Time Out was held and the above information confirmed. After being taken to the operating room general anesthesia was initiated and the patient  was orally intubated. The video fiberoptic bronchoscope was introduced via the endotracheal tube and a general inspection was performed which showed normal right and left lung anatomy however the left upper lobe anterior segments showed extrinsic compression of the airway.. The standard scope was then withdrawn and the endobronchial ultrasound was used to identify and characterize the peritracheal, hilar and bronchial lymph nodes. Inspection showed enlarged  left hilar adenopathy. Using real-time ultrasound guidance Wang needle biopsies were take from Station 11 L nodes and were sent for cytology. The patient tolerated the procedure well without apparent complications. There was no significant blood loss. The bronchoscope was withdrawn. Anesthesia was reversed and the patient was taken to the PACU for recovery.   Samples: 1. Wang needle biopsies from 11L node  Plans:  The patient will be discharged from the PACU to home when recovered from anesthesia. We will review the cytology, pathology results with the patient when they become available. Outpatient followup will be with Garner Nash, DO.   Garner Nash, DO [facility] 10/27/2022 2:24 PM

## 2022-10-27 NOTE — Progress Notes (Addendum)
Received patient from ENDO RN, patient had been in phase 2 for over 2 hours and, per endo RN, patient just needs to get dressed and wait for daughter to come back with portable oxygen. Endo RN stated patients o2 sats had been in the upper 90s. Patient alert, breathing easy, on [facility] which is baseline. Patient dressed and waiting for daughter to call.

## 2022-10-27 NOTE — Interval H&P Note (Signed)
History and Physical Interval Note:  10/27/2022 1:34 PM  Crystal Moore  has presented today for surgery, with the diagnosis of lung masses.  The various methods of treatment have been discussed with the patient and family. After consideration of risks, benefits and other options for treatment, the patient has consented to  Procedure(s): ROBOTIC ASSISTED NAVIGATIONAL BRONCHOSCOPY (Bilateral) VIDEO BRONCHOSCOPY WITH ENDOBRONCHIAL ULTRASOUND (Bilateral) as a surgical intervention.  The patient's history has been reviewed, patient examined, no change in status, stable for surgery.  I have reviewed the patient's chart and labs.  Questions were answered to the patient's satisfaction.     Alger

## 2022-10-27 NOTE — Care Management (Signed)
ED RNCM contacted by Dr. Silas Flood regarding a patient in Endo who needs home oxygen.  Oxygen order and home oxygen qualifying note placed.  [facility] DME liaison Kristen contacted referral accepted and home oxygen order being processed for delivery to pacu tonight.

## 2022-10-27 NOTE — Progress Notes (Signed)
Patient handed off to PACU while we wait on daughter to bring home portable O2 concentrator. Instructed daughter to call PACU when she gets back to the hospital.

## 2022-10-27 NOTE — Progress Notes (Signed)
Pt in recovery s/p bronch. O2 sat 82-88 on room air. Expiratory wheezes audible. MD Rose with anesthesia notified. Duoneb ordered and given. Pulmonary toilet encouraged with pt. After duoneb treatment O2 sat remains 88 on room air. Incentive spirometer done with patient. MD Icard aware. Stat chest xray ordered.

## 2022-10-27 NOTE — Transfer of Care (Signed)
Immediate Anesthesia Transfer of Care Note  Patient: Crystal Moore  Procedure(s) Performed: VIDEO BRONCHOSCOPY WITH ENDOBRONCHIAL ULTRASOUND (Bilateral) FINE NEEDLE ASPIRATION (FNA) LINEAR  Patient Location: PACU  Anesthesia Type:General  Level of Consciousness: awake  Airway & Oxygen Therapy: Patient connected to face mask oxygen  Post-op Assessment: Report given to RN and Post -op Vital signs reviewed and stable  Post vital signs: Reviewed and stable  Last Vitals:  Vitals Value Taken Time  BP 106/85 10/27/22 1430  Temp 36.5 C 10/27/22 1429  Pulse 61 10/27/22 1437  Resp 10 10/27/22 1437  SpO2 97 % 10/27/22 1437  Vitals shown include unvalidated device data.  Last Pain:  Vitals:   10/27/22 1429  TempSrc: Temporal  PainSc: 0-No pain         Complications: There were no known notable events for this encounter.

## 2022-10-28 DIAGNOSIS — E1139 Type 2 diabetes mellitus with other diabetic ophthalmic complication: Secondary | ICD-10-CM | POA: Diagnosis not present

## 2022-10-28 DIAGNOSIS — J449 Chronic obstructive pulmonary disease, unspecified: Secondary | ICD-10-CM | POA: Diagnosis not present

## 2022-10-28 DIAGNOSIS — I69354 Hemiplegia and hemiparesis following cerebral infarction affecting left non-dominant side: Secondary | ICD-10-CM | POA: Diagnosis not present

## 2022-10-28 DIAGNOSIS — R531 Weakness: Secondary | ICD-10-CM | POA: Diagnosis not present

## 2022-10-28 DIAGNOSIS — J9601 Acute respiratory failure with hypoxia: Secondary | ICD-10-CM | POA: Diagnosis not present

## 2022-10-28 LAB — CYTOLOGY - NON PAP

## 2022-10-28 NOTE — Anesthesia Postprocedure Evaluation (Signed)
Anesthesia Post Note  Patient: Crystal Moore  Procedure(s) Performed: VIDEO BRONCHOSCOPY WITH ENDOBRONCHIAL ULTRASOUND (Bilateral) FINE NEEDLE ASPIRATION (FNA) LINEAR     Patient location during evaluation: PACU Anesthesia Type: General Level of consciousness: awake Pain management: pain level controlled Vital Signs Assessment: post-procedure vital signs reviewed and stable Respiratory status: patient connected to nasal cannula oxygen and spontaneous breathing Cardiovascular status: stable Postop Assessment: no apparent nausea or vomiting Anesthetic complications: no  There were no known notable events for this encounter.  Last Vitals:  Vitals:   10/27/22 1642 10/27/22 1653  BP: (!) 139/56 (!) 126/53  Pulse: 74 76  Resp: 13 17  Temp:    SpO2: 98% 95%    Last Pain:  Vitals:   10/27/22 1642  TempSrc:   PainSc: 0-No pain   Pain Goal:                   Huston Foley

## 2022-10-29 LAB — NOVEL CORONAVIRUS, NAA: SARS-CoV-2, NAA: NOT DETECTED

## 2022-10-29 LAB — SPECIMEN STATUS REPORT

## 2022-10-30 ENCOUNTER — Encounter (HOSPITAL_COMMUNITY): Payer: Self-pay | Admitting: Pulmonary Disease

## 2022-10-30 ENCOUNTER — Other Ambulatory Visit: Payer: Self-pay

## 2022-10-30 DIAGNOSIS — R918 Other nonspecific abnormal finding of lung field: Secondary | ICD-10-CM

## 2022-10-30 NOTE — Progress Notes (Signed)
Navigator reached out to the pt's daughter, [name], to arrange her appointment with Dr.Mohamed. Navigator made self introduction and explained role of navigator in cancer care spectrum. Navigator offered appointment at 9am on 4/17 with Cassie Heilingoetter with labs prior to the appointment at 8:30. [name] verified that the day/time of the appointment would work for her. [name] is aware of the location of the cancer center and will be accompanying the patient to her appointment. Direct number given to [name] and encouraged her to call with any questions.

## 2022-11-02 DIAGNOSIS — C3490 Malignant neoplasm of unspecified part of unspecified bronchus or lung: Secondary | ICD-10-CM | POA: Diagnosis not present

## 2022-11-02 NOTE — Progress Notes (Signed)
Radiation Oncology         [phone] ________________________________  Name: Crystal Moore        MRN: [id number]  Date of Service: 11/04/2022 DOB: 1946-07-20  QM:[id number], Soyla Murphy, MD  Josephine Igo, DO     REFERRING PHYSICIAN: Josephine Igo, DO   DIAGNOSIS: The encounter diagnosis was Malignant neoplasm of upper lobe of left lung.   HISTORY OF PRESENT ILLNESS: Crystal Moore is a 77 y.o. female seen at the request of Dr. Tonia Brooms and Dr. Arbutus Ped for a newly diagnosed with a newly diagnosed lung cancer. She had interstitial prominence on a CXR in December 2022. She had she presented with strokelike symptoms in February 2024 to the emergency department.  On 07/23/2023 CT imaging of the head and neck with angiography identified no emergent vessel occlusion but advanced atherosclerotic changes of the internal carotid arteries bilaterally as well as chronic occlusion of the left vertebral artery.  There is a partially visualized left upper lobe mass and right upper lobe mass with hilar and mediastinal adenopathy concerning for lung cancer.  She underwent a CT angio of the chest that day that showed a 7 cm mass in the left upper lobe abutting the mediastinum, pleura and hilum and a second mass in the right upper lobe measuring up to 3.5 cm, a third left lower lobe nodule measured 3.1 cm and there were patchy groundglass changes throughout the lung bases chronic appearing interstitial opacities were also noted in the lungs with a calcified granuloma in the right lower lobe.  She had diffuse mediastinal and bilateral hilar adenopathy, the largest right hilar node was 14 mm, a precarinal node was 13 mm, and prevascular lymph nodes measured up to 10 mm.  Marked left hilar adenopathy was present with the largest node measuring up to 2.6 cm.  An MRI of the brain that day also showed changes consistent with prior vascular disease but no evidence of metastatic disease.     . A PET scan on 10/13/2022  showed bilateral hypermetabolic pulmonary nodules the largest in the left upper lobe and hypermetabolic left hilar adenopathy.  No other evidence of metastatic disease was appreciated. She underwent a bronchoscopy on 10/27/22 showed a metastatic squamous cell carcinoma of an 11L node She is scheduled for repeat MRI of the brain on 11/06/2022, she is alsoscheduled to meet with medical oncology next week and is seen today to discuss treatment recommendations of her cancer.    PREVIOUS RADIATION THERAPY: {EXAM; YES/NO:19492::"No"}   PAST MEDICAL HISTORY:  Past Medical History:  Diagnosis Date   Abnormal gait    wheelchair bound   Anemia 06/24/2022   IDA   Arthritis    COPD (chronic obstructive pulmonary disease)    COVID-19 virus infection 07/2020   CVA (cerebral vascular accident) 09/22/2022   with residual left-sided weakness   Dementia    Depression    no current problem   Diabetes mellitus without complication    GERD (gastroesophageal reflux disease)    Headache    Hemiparesis affecting left side as late effect of cerebrovascular accident (CVA)    w/c bound   HOH (hard of hearing) 01/13/2018   no hearing aids   Hypertension    Mass of lung    bilateral masses   PAD (peripheral artery disease)    in CE   Pneumonia    x 4   Pyelonephritis    Seizures 09/25/2022   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  Sensorineural hearing loss    bilateral in CE, no hearing aids   Stroke    Tobacco use disorder 2019   quit 2019   UTI (urinary tract infection)    Wheelchair dependent        PAST SURGICAL HISTORY: Past Surgical History:  Procedure Laterality Date   ABDOMINAL AORTOGRAM W/LOWER EXTREMITY N/A 08/30/2020   Procedure: ABDOMINAL AORTOGRAM W/LOWER EXTREMITY;  Surgeon: Leonie Douglas, MD;  Location: [facility];  Service: Cardiovascular;  Laterality: N/A;   ABDOMINAL AORTOGRAM W/LOWER EXTREMITY N/A 09/04/2020   Procedure: ABDOMINAL AORTOGRAM W/LOWER  EXTREMITY;  Surgeon: Leonie Douglas, MD;  Location: [facility];  Service: Cardiovascular;  Laterality: N/A;   ESOPHAGOGASTRODUODENOSCOPY (EGD) WITH PROPOFOL N/A 09/16/2020   Procedure: ESOPHAGOGASTRODUODENOSCOPY (EGD) WITH PROPOFOL;  Surgeon: Shellia Cleverly, DO;  Location: [facility];  Service: Gastroenterology;  Laterality: N/A;   FINE NEEDLE ASPIRATION  10/27/2022   Procedure: FINE NEEDLE ASPIRATION (FNA) LINEAR;  Surgeon: Josephine Igo, DO;  Location: [facility];  Service: Pulmonary;;   HEMOSTASIS CLIP PLACEMENT  09/16/2020   Procedure: HEMOSTASIS CLIP PLACEMENT;  Surgeon: Shellia Cleverly, DO;  Location: [facility];  Service: Gastroenterology;;   HOT HEMOSTASIS N/A 09/16/2020   Procedure: HOT HEMOSTASIS (ARGON PLASMA COAGULATION/BICAP);  Surgeon: Shellia Cleverly, DO;  Location: [facility];  Service: Gastroenterology;  Laterality: N/A;   PERIPHERAL VASCULAR INTERVENTION Right 08/30/2020   Procedure: PERIPHERAL VASCULAR INTERVENTION;  Surgeon: Leonie Douglas, MD;  Location: [facility];  Service: Cardiovascular;  Laterality: Right;  femoral popliteal   PERIPHERAL VASCULAR INTERVENTION Left 09/04/2020   Procedure: PERIPHERAL VASCULAR INTERVENTION;  Surgeon: Leonie Douglas, MD;  Location: [facility];  Service: Cardiovascular;  Laterality: Left;  SFA   TUBAL LIGATION     VIDEO BRONCHOSCOPY WITH ENDOBRONCHIAL ULTRASOUND Bilateral 10/27/2022   Procedure: VIDEO BRONCHOSCOPY WITH ENDOBRONCHIAL ULTRASOUND;  Surgeon: Josephine Igo, DO;  Location: [facility];  Service: Pulmonary;  Laterality: Bilateral;     FAMILY HISTORY:  Family History  Problem Relation Age of Onset   Diabetes Mother    Diabetes Sister    Diabetes Brother      SOCIAL HISTORY:  reports that she quit smoking about 5 years ago. Her smoking use included cigarettes. She has a 40.00 pack-year smoking history. She has never used smokeless tobacco. She reports that she does not drink alcohol  and does not use drugs.   ALLERGIES: Aspirin   MEDICATIONS:  Current Outpatient Medications  Medication Sig Dispense Refill   ACCU-CHEK GUIDE test strip USE TO test AS DIRECTED TWICE DAILY 200 strip 2   acetaminophen (TYLENOL) 500 MG tablet Take 1,000 mg by mouth every 6 (six) hours as needed (pain).     albuterol (PROVENTIL) (2.5 MG/3ML) 0.083% nebulizer solution Inhale 3 mLs (2.5 mg total) into the lungs every 4 (four) hours as needed for wheezing or shortness of breath. 75 mL 12   albuterol (VENTOLIN HFA) 108 (90 Base) MCG/ACT inhaler Inhale 1 puff into the lungs every 6 (six) hours as needed for wheezing or shortness of breath.     amLODipine (NORVASC) 5 MG tablet Take 5 mg by mouth in the morning.     atorvastatin (LIPITOR) 80 MG tablet Take 1 tablet (80 mg total) by mouth daily at 6 PM. (Patient taking differently: Take 80 mg by mouth every evening.) 30 tablet 0   clopidogrel (PLAVIX) 75 MG tablet Take 1 tablet (75 mg total) by mouth  daily. 30 tablet 11   feeding supplement (ENSURE ENLIVE / ENSURE PLUS) LIQD Take 237 mLs by mouth 2 (two) times daily between meals. 237 mL 12   ferrous sulfate 325 (65 FE) MG tablet Take 1 tablet (325 mg total) by mouth daily with breakfast. 30 tablet 3   folic acid (FOLVITE) 1 MG tablet Take 1 tablet (1 mg total) by mouth daily. 30 tablet 2   gabapentin (NEURONTIN) 300 MG capsule Take 1 capsule (300 mg total) by mouth 3 (three) times daily. 30 capsule 3   insulin glargine (LANTUS) 100 UNIT/ML injection Inject 0.15 mLs (15 Units total) into the skin daily. (Patient taking differently: Inject 25 Units into the skin daily.) 10 mL 11   linagliptin (TRADJENTA) 5 MG TABS tablet Take 5 mg by mouth daily.     pantoprazole (PROTONIX) 40 MG tablet Take 40 mg by mouth 2 (two) times daily.     No current facility-administered medications for this visit.     REVIEW OF SYSTEMS: On review of systems, the patient reports that *** is doing well overall. *** denies any  chest pain, shortness of breath, cough, fevers, chills, night sweats, unintended weight changes. *** denies any bowel or bladder disturbances, and denies abdominal pain, nausea or vomiting. *** denies any new musculoskeletal or joint aches or pains. A complete review of systems is obtained and is otherwise negative.     PHYSICAL EXAM:  Wt Readings from Last 3 Encounters:  10/27/22 141 lb 1.5 oz (64 kg)  10/15/22 141 lb (64 kg)  09/25/22 144 lb 2.9 oz (65.4 kg)   Temp Readings from Last 3 Encounters:  10/27/22 97.7 F (36.5 C) (Temporal)  09/25/22 98.4 F (36.9 C) (Oral)  03/10/22 98.3 F (36.8 C)   BP Readings from Last 3 Encounters:  10/27/22 (!) 126/53  10/15/22 100/60  09/25/22 (!) 153/65   Pulse Readings from Last 3 Encounters:  10/27/22 76  10/15/22 79  09/25/22 72    /10  In general this is a well appearing *** in no acute distress. ***'s alert and oriented x4 and appropriate throughout the examination. Cardiopulmonary assessment is negative for acute distress and *** exhibits normal effort.     ECOG = ***  0 - Asymptomatic (Fully active, able to carry on all predisease activities without restriction)  1 - Symptomatic but completely ambulatory (Restricted in physically strenuous activity but ambulatory and able to carry out work of a light or sedentary nature. For example, light housework, office work)  2 - Symptomatic, <50% in bed during the day (Ambulatory and capable of all self care but unable to carry out any work activities. Up and about more than 50% of waking hours)  3 - Symptomatic, >50% in bed, but not bedbound (Capable of only limited self-care, confined to bed or chair 50% or more of waking hours)  4 - Bedbound (Completely disabled. Cannot carry on any self-care. Totally confined to bed or chair)  5 - Death   Santiago Glad MM, Creech RH, Tormey DC, et al. [phone]). "Toxicity and response criteria of the [facility]". Am. Evlyn Clines. Oncol. 5  (6): 649-55    LABORATORY DATA:  Lab Results  Component Value Date   WBC 10.2 10/27/2022   HGB 11.5 (L) 10/27/2022   HCT 38.3 10/27/2022   MCV 76.6 (L) 10/27/2022   PLT 290 10/27/2022   Lab Results  Component Value Date   NA 138 10/27/2022   K 4.4 10/27/2022  CL 105 10/27/2022   CO2 21 (L) 10/27/2022   Lab Results  Component Value Date   ALT 20 09/22/2022   AST 18 09/22/2022   ALKPHOS 87 09/22/2022   BILITOT 0.4 09/22/2022      RADIOGRAPHY: DG CHEST PORT 1 VIEW  Result Date: 10/27/2022 CLINICAL DATA:  Status post bronchoscopy. EXAM: PORTABLE CHEST 1 VIEW COMPARISON:  09/22/2022 FINDINGS: Stable multiple pulmonary masses. No pneumothorax, hematoma pleural effusion. IMPRESSION: 1. Stable pulmonary masses. 2. No pneumothorax or hematoma. Electronically Signed   By: Rudie MeyerP.  Gallerani M.D.   On: 10/27/2022 15:46   NM PET SUPER D CT  Result Date: 10/15/2022 CLINICAL DATA:  Lung masses; * Tracking Code: BO * EXAM: CT CHEST WITHOUT CONTRAST TECHNIQUE: Multidetector CT imaging of the chest was performed using thin slice collimation for electromagnetic bronchoscopy planning purposes, without intravenous contrast. RADIATION DOSE REDUCTION: This exam was performed according to the departmental dose-optimization program which includes automated exposure control, adjustment of the mA and/or kV according to patient size and/or use of iterative reconstruction technique. COMPARISON:  Chest CT dated September 22, 2020 FINDINGS: Cardiovascular: Normal heart size. No pericardial effusion. Normal caliber thoracic aorta with severe calcified plaque. Severe coronary artery calcifications. Mediastinum/Nodes: Small hiatal hernia. Thyroid is unremarkable. Asymmetric left hilar soft tissue, likely due to conglomerate hilar adenopathy. Reference left hilar soft tissue located posterior to the left mainstem bronchus measuring 1.8 x 1.9 cm on series 2, image 29. Lungs/Pleura: Moderate centrilobular emphysema.  Occlusion of the superior left upper lobe bronchi. Remaining central airways are patent. Bilateral pulmonary masses, unchanged when compared with prior exam. Left upper lobe mass measuring 7.1 x 4.5 cm on series 4, image 66. Right upper lobe irregular mass measuring 3.5 x 3.2 cm on image 43. Left lower lobe mass measuring 3.1 x 2.3 cm on image 64. Calcified granuloma of the right lower lobe. Unchanged peribronchovascular predominant reticular opacities traction bronchiectasis, most pronounced in the lower lungs. No pleural effusion or pneumothorax. Upper Abdomen: No acute abnormality. Musculoskeletal: Unchanged moderate compression deformity of L1. no aggressive appearing osseous lesions. IMPRESSION: 1. Stable bilateral pulmonary masses, largest is located in the left upper lobe. 2. Stable left hilar adenopathy. 3. Peribronchovascular reticular opacities and traction bronchiectasis, likely due to fibrotic interstitial lung disease. 4. Aortic Atherosclerosis (ICD10-I70.0) and Emphysema (ICD10-J43.9). Electronically Signed   By: Allegra LaiLeah  Strickland M.D.   On: 10/15/2022 09:40   NM PET Image Initial (PI) Skull Base To Thigh  Result Date: 10/15/2022 CLINICAL DATA:  Initial treatment strategy for lung masses. EXAM: NUCLEAR MEDICINE PET SKULL BASE TO THIGH TECHNIQUE: 6.6 mCi F-18 FDG was injected intravenously. Full-ring PET imaging was performed from the skull base to thigh after the radiotracer. CT data was obtained and used for attenuation correction and anatomic localization. Fasting blood glucose: 206 mg/dl COMPARISON:  Same day chest CT; chest CT dated September 22, 2022; CT abdomen and pelvis dated September 23, 2022 FINDINGS: Mediastinal blood pool activity: SUV max 2.9 Liver activity: SUV max NA NECK: Limited evaluation due to severe misregistration artifact related to patient motion. No hypermetabolic lymph nodes in the neck. Incidental CT findings: None. CHEST: Bilateral hypermetabolic pulmonary masses,  unchanged in size when compared with prior exam. Left upper lobe mass has an SUV max of 12.2. Right upper lobe mass has SUV max of 0.6. Left lower lobe mass has an SUV max of 10.3. Hypermetabolic left hilar soft tissue, hilar adenopathy. Reference hilar soft tissue located posterior to the left mainstem with SUV  max of 7.4. Incidental CT findings: See same day separately dictated chest CT. ABDOMEN/PELVIS: No abnormal hypermetabolic activity within the liver, pancreas, adrenal glands, or spleen. No hypermetabolic lymph nodes in the abdomen or pelvis. Incidental CT findings: Simple appearing cyst of the right kidney, no specific follow-up imaging is recommended. Diverticulosis. Normal caliber abdominal aorta with severe calcified plaque. SKELETON: No focal hypermetabolic activity to suggest skeletal metastasis. Incidental CT findings: None. IMPRESSION: 1. Bilateral hypermetabolic pulmonary masses, largest is located in the left upper lobe. 2. Hypermetabolic left hilar adenopathy. 3. No evidence of hypermetabolic metastatic disease in the abdomen or pelvis. 4. Aortic Atherosclerosis (ICD10-I70.0). Electronically Signed   By: Allegra Lai M.D.   On: 10/15/2022 09:38       IMPRESSION/PLAN: 1. Stage IV, NSCLC, Squamous Cell Carcinoma of the LUL involving the right lung. Dr. Mitzi Hansen discusses the pathology findings and reviews the nature of ***  We discussed the risks, benefits, short, and long term effects of radiotherapy, as well as the curative intent, and the patient is interested in proceeding. Dr. Mitzi Hansen discusses the delivery and logistics of radiotherapy and anticipates a course of *** weeks of radiotherapy. ***   In a visit lasting *** minutes, greater than 50% of the time was spent face to face discussing the patient's condition, in preparation for the discussion, and coordinating the patient's care.   The above documentation reflects my direct findings during this shared patient visit. Please see the  separate note by Dr. Mitzi Hansen on this date for the remainder of the patient's plan of care.    Osker Mason, [facility]   **Disclaimer: This note was dictated with voice recognition software. Similar sounding words can inadvertently be transcribed and this note may contain transcription errors which may not have been corrected upon publication of note.**

## 2022-11-03 ENCOUNTER — Ambulatory Visit: Payer: 59 | Admitting: Acute Care

## 2022-11-03 NOTE — Progress Notes (Signed)
Pt has 6 Innova overlapping stents that overlap farther than the manufacturers conditional testing. Per Dr Mosetta Putt, pt cannot be safely scanned.

## 2022-11-04 ENCOUNTER — Encounter (HOSPITAL_COMMUNITY): Payer: Self-pay

## 2022-11-04 ENCOUNTER — Encounter: Payer: Self-pay | Admitting: Radiation Oncology

## 2022-11-04 ENCOUNTER — Other Ambulatory Visit: Payer: Self-pay

## 2022-11-04 ENCOUNTER — Ambulatory Visit
Admission: RE | Admit: 2022-11-04 | Discharge: 2022-11-04 | Disposition: A | Discharge status: Home / Self Care | Payer: 59 | Source: Ambulatory Visit | Attending: Radiation Oncology | Admitting: Radiation Oncology

## 2022-11-04 VITALS — BP 136/61 | HR 81 | Temp 97.0°F | Resp 18 | Ht 62.0 in | Wt 141.0 lb

## 2022-11-04 DIAGNOSIS — I700 Atherosclerosis of aorta: Secondary | ICD-10-CM | POA: Diagnosis not present

## 2022-11-04 DIAGNOSIS — Z8616 Personal history of COVID-19: Secondary | ICD-10-CM | POA: Diagnosis not present

## 2022-11-04 DIAGNOSIS — D509 Iron deficiency anemia, unspecified: Secondary | ICD-10-CM | POA: Insufficient documentation

## 2022-11-04 DIAGNOSIS — Z87891 Personal history of nicotine dependence: Secondary | ICD-10-CM | POA: Diagnosis not present

## 2022-11-04 DIAGNOSIS — G8194 Hemiplegia, unspecified affecting left nondominant side: Secondary | ICD-10-CM | POA: Diagnosis not present

## 2022-11-04 DIAGNOSIS — K219 Gastro-esophageal reflux disease without esophagitis: Secondary | ICD-10-CM | POA: Diagnosis not present

## 2022-11-04 DIAGNOSIS — E119 Type 2 diabetes mellitus without complications: Secondary | ICD-10-CM | POA: Diagnosis not present

## 2022-11-04 DIAGNOSIS — K449 Diaphragmatic hernia without obstruction or gangrene: Secondary | ICD-10-CM | POA: Diagnosis not present

## 2022-11-04 DIAGNOSIS — G40909 Epilepsy, unspecified, not intractable, without status epilepticus: Secondary | ICD-10-CM | POA: Insufficient documentation

## 2022-11-04 DIAGNOSIS — Z79899 Other long term (current) drug therapy: Secondary | ICD-10-CM | POA: Insufficient documentation

## 2022-11-04 DIAGNOSIS — Z7902 Long term (current) use of antithrombotics/antiplatelets: Secondary | ICD-10-CM | POA: Diagnosis not present

## 2022-11-04 DIAGNOSIS — J432 Centrilobular emphysema: Secondary | ICD-10-CM | POA: Insufficient documentation

## 2022-11-04 DIAGNOSIS — C3412 Malignant neoplasm of upper lobe, left bronchus or lung: Secondary | ICD-10-CM | POA: Diagnosis not present

## 2022-11-04 DIAGNOSIS — F0390 Unspecified dementia without behavioral disturbance: Secondary | ICD-10-CM | POA: Diagnosis not present

## 2022-11-04 DIAGNOSIS — J449 Chronic obstructive pulmonary disease, unspecified: Secondary | ICD-10-CM | POA: Insufficient documentation

## 2022-11-04 DIAGNOSIS — Z87440 Personal history of urinary (tract) infections: Secondary | ICD-10-CM | POA: Insufficient documentation

## 2022-11-04 DIAGNOSIS — I10 Essential (primary) hypertension: Secondary | ICD-10-CM | POA: Insufficient documentation

## 2022-11-04 DIAGNOSIS — I739 Peripheral vascular disease, unspecified: Secondary | ICD-10-CM | POA: Diagnosis not present

## 2022-11-04 DIAGNOSIS — Z794 Long term (current) use of insulin: Secondary | ICD-10-CM | POA: Diagnosis not present

## 2022-11-04 NOTE — Progress Notes (Signed)
Thoracic Location of Tumor / Histology: Left Upper Lobe Lung  Patient presented   Bronchoscopy 10/27/2022:   PET 10/13/2022:  Bilateral hypermetabolic pulmonary masses, largest is located in the left upper lobe.  Hypermetabolic left hilar adenopathy.  No evidence of hypermetabolic metastatic disease in the abdomen or pelvis.  CT Abd/Pelvis 09/23/2022: No evidence of mass or lymphadenopathy in the abdomen or pelvis.   MRI Brain 09/22/2022:  No acute intracranial abnormality. 2. Generalized cerebral atrophy with chronic microvascular ischemic disease, with multiple remote lacunar infarcts involving the bilateral basal ganglia, thalami, and cerebellum.  CTA Chest 09/22/2022:  No evidence for pulmonary embolism. 2. New bilateral pulmonary masses worrisome for neoplasm/metastatic disease. The dominant masses in the left upper lobe.  New mediastinal and bilateral hilar lymphadenopathy most significant in the left hilum.  Patchy ground-glass opacities in the lung bases may be infectious/inflammatory or related to edema.    Biopsies of Lymph Node 10/27/2022    Tobacco/Marijuana/Snuff/ETOH use: Former Smoker, quit 2019.  Past/Anticipated interventions by cardiothoracic surgery, if any:   Past/Anticipated interventions by medical oncology, if any:  Crystal Moore 11/11/2022   Signs/Symptoms Weight changes, if any: No Respiratory complaints, if any: Wears 2 liters Oxygen Hemoptysis, if any: Productive cough with clear mucus. Pain issues, if any: No   SAFETY ISSUES: Prior radiation? No Pacemaker/ICD? No  Possible current pregnancy? Postmenopausal Is the patient on methotrexate? No  Current Complaints / other details:

## 2022-11-05 ENCOUNTER — Encounter: Payer: Self-pay | Admitting: *Deleted

## 2022-11-05 ENCOUNTER — Ambulatory Visit (INDEPENDENT_AMBULATORY_CARE_PROVIDER_SITE_OTHER): Payer: 59 | Admitting: Acute Care

## 2022-11-05 ENCOUNTER — Encounter: Payer: Self-pay | Admitting: Acute Care

## 2022-11-05 ENCOUNTER — Other Ambulatory Visit: Payer: Self-pay | Admitting: *Deleted

## 2022-11-05 DIAGNOSIS — R918 Other nonspecific abnormal finding of lung field: Secondary | ICD-10-CM | POA: Diagnosis not present

## 2022-11-05 DIAGNOSIS — Z87891 Personal history of nicotine dependence: Secondary | ICD-10-CM

## 2022-11-05 DIAGNOSIS — R942 Abnormal results of pulmonary function studies: Secondary | ICD-10-CM | POA: Diagnosis not present

## 2022-11-05 DIAGNOSIS — C3490 Malignant neoplasm of unspecified part of unspecified bronchus or lung: Secondary | ICD-10-CM

## 2022-11-05 NOTE — Progress Notes (Signed)
Virtual Visit via Telephone Note  I connected with Crystal Moore on 11/05/22 at  9:30 AM EDT by telephone and verified that I am speaking with the correct person using two identifiers.  Location: Patient:  At home Provider: [address] [place], [place], [address]    I discussed the limitations, risks, security and privacy concerns of performing an evaluation and management service by telephone and the availability of in person appointments. I also discussed with the patient that there may be a patient responsible charge related to this service. The patient expressed understanding and agreed to proceed.  Synopsis Crystal Moore is a 77 y.o. female former smoker ( Quit 2019 with a 40+ pack year smoking history) seen by radiation oncology 4/10  with a newly diagnosed lung cancer. She had interstitial prominence on a CXR in December 2022. She had she presented with stroke like symptoms in February 2024 to the emergency department.  On 07/23/2023 CT imaging of the head and neck with angiography identified no emergent vessel occlusion but advanced atherosclerotic changes of the internal carotid arteries bilaterally as well as chronic occlusion of the left vertebral artery.  There was a partially visualized left upper lobe mass and right upper lobe mass with hilar and mediastinal adenopathy concerning for lung cancer.  She underwent a CT angio of the chest that day that showed a 7 cm mass in the left upper lobe abutting the mediastinum, pleura and hilum and a second mass in the right upper lobe measuring up to 3.5 cm, a third left lower lobe nodule measured 3.1 cm and there were patchy groundglass changes throughout the lung bases chronic appearing interstitial opacities were also noted in the lungs with a calcified granuloma in the right lower lobe.  She had diffuse mediastinal and bilateral hilar adenopathy, the largest right hilar node was 14 mm, a precarinal node was 13 mm, and prevascular lymph  nodes measured up to 10 mm.  Marked left hilar adenopathy was present with the largest node measuring up to 2.6 cm.  An MRI of the brain that day also showed changes consistent with prior vascular disease but no evidence of metastatic disease.      A PET scan on 10/13/2022 showed bilateral hypermetabolic pulmonary nodules the largest in the left upper lobe and hypermetabolic left hilar adenopathy.  No other evidence of metastatic disease was appreciated. She underwent a bronchoscopy on 10/27/22 per Dr. Tonia Brooms which showed a metastatic squamous cell carcinoma of an 11L node She is scheduled for repeat MRI of the brain on 11/06/2022, she is also scheduled to meet with medical oncology next 4/176/2024. She was seen 11/04/2022 by radiation oncology to discuss treatment recommendations of her cancer.   History of Present Illness: This visit was originally scheduled as a bronch follow up in the office. Pt. Was a no show for the visit. We called and rescheduled for today in the office and again the patient was a no show. Karlton Lemon RN called the patient. Her daughter answered and states her mother was not feeling well this morning, so they did not come into the office for the visit. We will proceed as a telephone visit, as we need to make sure that  post general anesthesia and bronch patient has not had bleeding, developed an infection or had adverse reaction to anesthesia.   Per daughter, patient has been doing well after bronchoscopy. She denies any bleeding, no signs of infection. No fever. She did have a hard  time with the anesthesia. She didn't fully wake up  the day of the procedure and the day after then the third day she was fine, back to her baseline. Eating and drinking and joking with family. Pt was seen in the radiation oncology clinic. Her functional status is poor. She is in a wheelchair  .She's had approximately 6 strokes and has been wheel chair dependant other than transferring. Her daughter states  sometimes her mother does speak, but it depends on her mood. She eats well as long as her food is cut in smaller pieces. She spends most of her day in a recliner. The patient has had contracture of her LUE for more than 10 years. Her daughter doesn't seem to notice any shortness of breath but at home she wears 2L [place], although was not wearing this when seen in the radiation oncology clinic.  She has a cough with clear mucous at times. Again No hemoptysis is noted. Dr. Mitzi HansenMoody is recommending palliative radiation as family are not interested in chemo. They feel it will negatively impact her quality of life. Patient has an appointment with medical oncology 11/11/2022. She was encouraged by Monico BlitzAllison Perkins PA to follow up with them to evaluate for other options for treatment. I have encouraged her to do the same as per thoracic conference this morning , Dr. Arbutus PedMohamed will  evaluate PDL1 results   to see if there are other options for treatment. Plan is for palliative  radiation at present. ( Most likely this will be the plan) Daughter is going to conference with her siblings to make sure they are in agreement with radiation therapy. Family is not interested in chemo. They do not like the risk benefit ratio.     Observations/Objective: Vital Signs from OV with radiation oncology 11/04/2022 reviewed as this was a virtual visit  BP 136/61 (BP Location: Left Arm, Patient Position: Sitting)   Pulse 81   Temp 97 F (36.1 C) Important   (Temporal)   Resp 18   Ht 5\' 2"  (1.575 m)   Wt 141 lb (64 kg)   SpO2 95%   BMI 25.79 kg/m   BSA 1.67 m   Pain [place] 0-No pain       PET Scan 10/13/2022 Bilateral hypermetabolic pulmonary masses, largest is located in the left upper lobe. Hypermetabolic left hilar adenopathy. No evidence of hypermetabolic metastatic disease in the abdomen or pelvis. Aortic Atherosclerosis (ICD10-I70.0).   CTA 09/22/2022 No evidence for pulmonary embolism. 2. New bilateral  pulmonary masses worrisome for neoplasm/metastatic disease. The dominant masses in the left upper lobe. 3. New mediastinal and bilateral hilar lymphadenopathy most significant in the left hilum. 4. Patchy ground-glass opacities in the lung bases may be infectious/inflammatory or related to edema.  Assessment and Plan: Stage IV, NSCLC, Squamous Cell Carcinoma of the LUL involving the right lung, vs. Stage III NSCLC of the LUL with synchronous (putative) Stage I NSCLC of the RUL Family are not interested in Chemo Plan Follow up with medical oncology to review PDL 1 results 11/11/2022 with Dr. Arbutus PedMohamed as is scheduled. Palliative radiation per Dr. Mitzi HansenMoody, anticipated duration 2-3 weeks   Follow Up Instructions: Follow up as needed for any respiratory issues, questions or concerns.    I discussed the assessment and treatment plan with the patient. The patient was provided an opportunity to ask questions and all were answered. The patient agreed with the plan and demonstrated an understanding of the instructions.   The patient was advised to  call back or seek an in-person evaluation if the symptoms worsen or if the condition fails to improve as anticipated.  I provided 35 minutes of non-face-to-face time during this encounter.   Bevelyn Ngo, NP

## 2022-11-05 NOTE — Progress Notes (Signed)
Spoke with Deanna Artis in Pathology to confirm that PD-L1 has been requested.

## 2022-11-05 NOTE — Patient Instructions (Addendum)
It was good to talk with you today. We are glad you have done so well after your procedure. Follow up with medical oncology to review PDL 1 results 11/11/2022 with Dr. Arbutus Ped as is scheduled. Palliative radiation per Dr. Mitzi Hansen, anticipated duration 2-3 weeks Follow up with Dr. Tonia Brooms or Maralyn Sago NP for any breathing issues, blood in your secretions or lung infections, or seek emergency care. [name] with your treatment.

## 2022-11-05 NOTE — Progress Notes (Signed)
The proposed treatment discussed in conference is for discussion purpose only and is not a binding recommendation.  The patients have not been physically examined, or presented with their treatment options.  Therefore, final treatment plans cannot be decided.  

## 2022-11-05 NOTE — Progress Notes (Deleted)
History of Present Illness Crystal Moore is a 77 y.o. female with    77 year old female with a past medical history significant for stroke and peripheral arterial disease presented to this facility with strokelike symptoms. Now having worse aphasia compared to baseline.    11/05/2022  Test Results:     Latest Ref Rng & Units 10/27/2022   10:56 AM 09/24/2022    5:03 AM 09/23/2022    4:00 AM  CBC  WBC 4.0 - 10.5 K/uL 10.2  8.5  8.3   Hemoglobin 12.0 - 15.0 g/dL 62.8  9.0  9.7   Hematocrit 36.0 - 46.0 % 38.3  28.4  31.9   Platelets 150 - 400 K/uL 290  282  198        Latest Ref Rng & Units 10/27/2022   10:18 AM 09/24/2022    5:03 AM 09/23/2022    4:00 AM  BMP  Glucose 70 - 99 mg/dL 81  315  82   BUN 8 - 23 mg/dL 18  20  16    Creatinine 0.44 - 1.00 mg/dL 1.76  1.60  7.37   Sodium 135 - 145 mmol/L 138  137  138   Potassium 3.5 - 5.1 mmol/L 4.4  4.1  4.6   Chloride 98 - 111 mmol/L 105  107  107   CO2 22 - 32 mmol/L 21  22  22    Calcium 8.9 - 10.3 mg/dL 9.1  8.6  8.6     BNP    Component Value Date/Time   BNP 38.6 09/23/2022 0400    ProBNP No results found for: "PROBNP"  PFT No results found for: "FEV1PRE", "FEV1POST", "FVCPRE", "FVCPOST", "TLC", "DLCOUNC", "PREFEV1FVCRT", "PSTFEV1FVCRT"  DG CHEST PORT 1 VIEW  Result Date: 10/27/2022 CLINICAL DATA:  Status post bronchoscopy. EXAM: PORTABLE CHEST 1 VIEW COMPARISON:  09/22/2022 FINDINGS: Stable multiple pulmonary masses. No pneumothorax, hematoma pleural effusion. IMPRESSION: 1. Stable pulmonary masses. 2. No pneumothorax or hematoma. Electronically Signed   By: Rudie Meyer M.D.   On: 10/27/2022 15:46   NM PET SUPER D CT  Result Date: 10/15/2022 CLINICAL DATA:  Lung masses; * Tracking Code: BO * EXAM: CT CHEST WITHOUT CONTRAST TECHNIQUE: Multidetector CT imaging of the chest was performed using thin slice collimation for electromagnetic bronchoscopy planning purposes, without intravenous contrast. RADIATION DOSE  REDUCTION: This exam was performed according to the departmental dose-optimization program which includes automated exposure control, adjustment of the mA and/or kV according to patient size and/or use of iterative reconstruction technique. COMPARISON:  Chest CT dated September 22, 2020 FINDINGS: Cardiovascular: Normal heart size. No pericardial effusion. Normal caliber thoracic aorta with severe calcified plaque. Severe coronary artery calcifications. Mediastinum/Nodes: Small hiatal hernia. Thyroid is unremarkable. Asymmetric left hilar soft tissue, likely due to conglomerate hilar adenopathy. Reference left hilar soft tissue located posterior to the left mainstem bronchus measuring 1.8 x 1.9 cm on series 2, image 29. Lungs/Pleura: Moderate centrilobular emphysema. Occlusion of the superior left upper lobe bronchi. Remaining central airways are patent. Bilateral pulmonary masses, unchanged when compared with prior exam. Left upper lobe mass measuring 7.1 x 4.5 cm on series 4, image 66. Right upper lobe irregular mass measuring 3.5 x 3.2 cm on image 43. Left lower lobe mass measuring 3.1 x 2.3 cm on image 64. Calcified granuloma of the right lower lobe. Unchanged peribronchovascular predominant reticular opacities traction bronchiectasis, most pronounced in the lower lungs. No pleural effusion or pneumothorax. Upper Abdomen: No acute abnormality. Musculoskeletal: Unchanged moderate compression  deformity of L1. no aggressive appearing osseous lesions. IMPRESSION: 1. Stable bilateral pulmonary masses, largest is located in the left upper lobe. 2. Stable left hilar adenopathy. 3. Peribronchovascular reticular opacities and traction bronchiectasis, likely due to fibrotic interstitial lung disease. 4. Aortic Atherosclerosis (ICD10-I70.0) and Emphysema (ICD10-J43.9). Electronically Signed   By: Allegra LaiLeah  Strickland M.D.   On: 10/15/2022 09:40   NM PET Image Initial (PI) Skull Base To Thigh  Result Date: 10/15/2022 CLINICAL  DATA:  Initial treatment strategy for lung masses. EXAM: NUCLEAR MEDICINE PET SKULL BASE TO THIGH TECHNIQUE: 6.6 mCi F-18 FDG was injected intravenously. Full-ring PET imaging was performed from the skull base to thigh after the radiotracer. CT data was obtained and used for attenuation correction and anatomic localization. Fasting blood glucose: 206 mg/dl COMPARISON:  Same day chest CT; chest CT dated September 22, 2022; CT abdomen and pelvis dated September 23, 2022 FINDINGS: Mediastinal blood pool activity: SUV max 2.9 Liver activity: SUV max NA NECK: Limited evaluation due to severe misregistration artifact related to patient motion. No hypermetabolic lymph nodes in the neck. Incidental CT findings: None. CHEST: Bilateral hypermetabolic pulmonary masses, unchanged in size when compared with prior exam. Left upper lobe mass has an SUV max of 12.2. Right upper lobe mass has SUV max of 0.6. Left lower lobe mass has an SUV max of 10.3. Hypermetabolic left hilar soft tissue, hilar adenopathy. Reference hilar soft tissue located posterior to the left mainstem with SUV max of 7.4. Incidental CT findings: See same day separately dictated chest CT. ABDOMEN/PELVIS: No abnormal hypermetabolic activity within the liver, pancreas, adrenal glands, or spleen. No hypermetabolic lymph nodes in the abdomen or pelvis. Incidental CT findings: Simple appearing cyst of the right kidney, no specific follow-up imaging is recommended. Diverticulosis. Normal caliber abdominal aorta with severe calcified plaque. SKELETON: No focal hypermetabolic activity to suggest skeletal metastasis. Incidental CT findings: None. IMPRESSION: 1. Bilateral hypermetabolic pulmonary masses, largest is located in the left upper lobe. 2. Hypermetabolic left hilar adenopathy. 3. No evidence of hypermetabolic metastatic disease in the abdomen or pelvis. 4. Aortic Atherosclerosis (ICD10-I70.0). Electronically Signed   By: Allegra LaiLeah  Strickland M.D.   On: 10/15/2022  09:38     Past medical hx Past Medical History:  Diagnosis Date   Abnormal gait    wheelchair bound   Anemia 06/24/2022   IDA   Arthritis    COPD (chronic obstructive pulmonary disease)    COVID-19 virus infection 07/2020   CVA (cerebral vascular accident) 09/22/2022   with residual left-sided weakness   Dementia    Depression    no current problem   Diabetes mellitus without complication    GERD (gastroesophageal reflux disease)    Headache    Hemiparesis affecting left side as late effect of cerebrovascular accident (CVA)    w/c bound   HOH (hard of hearing) 01/13/2018   no hearing aids   Hypertension    Mass of lung    bilateral masses   PAD (peripheral artery disease)    in CE   Pneumonia    x 4   Pyelonephritis    Seizures 09/25/2022   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   Sensorineural hearing loss    bilateral in CE, no hearing aids   Stroke    Tobacco use disorder 2019   quit 2019   UTI (urinary tract infection)    Wheelchair dependent      Social History   Tobacco Use   Smoking status: Former  Packs/day: 1.00    Years: 40.00    Additional pack years: 0.00    Total pack years: 40.00    Types: Cigarettes    Quit date: 2019    Years since quitting: 5.2   Smokeless tobacco: Never  Vaping Use   Vaping Use: Never used  Substance Use Topics   Alcohol use: Never   Drug use: Never    Ms.Dozier reports that she quit smoking about 5 years ago. Her smoking use included cigarettes. She has a 40.00 pack-year smoking history. She has never used smokeless tobacco. She reports that she does not drink alcohol and does not use drugs.  Tobacco Cessation: Counseling given: Not Answered   Past surgical hx, Family hx, Social hx all reviewed.  Current Outpatient Medications on File Prior to Visit  Medication Sig   ACCU-CHEK GUIDE test strip USE TO test AS DIRECTED TWICE DAILY   acetaminophen (TYLENOL) 500 MG tablet Take 1,000 mg by  mouth every 6 (six) hours as needed (pain).   albuterol (PROVENTIL) (2.5 MG/3ML) 0.083% nebulizer solution Inhale 3 mLs (2.5 mg total) into the lungs every 4 (four) hours as needed for wheezing or shortness of breath.   albuterol (VENTOLIN HFA) 108 (90 Base) MCG/ACT inhaler Inhale 1 puff into the lungs every 6 (six) hours as needed for wheezing or shortness of breath.   amLODipine (NORVASC) 5 MG tablet Take 5 mg by mouth in the morning.   atorvastatin (LIPITOR) 80 MG tablet Take 1 tablet (80 mg total) by mouth daily at 6 PM. (Patient taking differently: Take 80 mg by mouth every evening.)   clopidogrel (PLAVIX) 75 MG tablet Take 1 tablet (75 mg total) by mouth daily.   feeding supplement (ENSURE ENLIVE / ENSURE PLUS) LIQD Take 237 mLs by mouth 2 (two) times daily between meals.   ferrous sulfate 325 (65 FE) MG tablet Take 1 tablet (325 mg total) by mouth daily with breakfast.   folic acid (FOLVITE) 1 MG tablet Take 1 tablet (1 mg total) by mouth daily.   gabapentin (NEURONTIN) 300 MG capsule Take 1 capsule (300 mg total) by mouth 3 (three) times daily.   insulin glargine (LANTUS) 100 UNIT/ML injection Inject 0.15 mLs (15 Units total) into the skin daily. (Patient taking differently: Inject 25 Units into the skin daily.)   linagliptin (TRADJENTA) 5 MG TABS tablet Take 5 mg by mouth daily.   pantoprazole (PROTONIX) 40 MG tablet Take 40 mg by mouth 2 (two) times daily.   No current facility-administered medications on file prior to visit.     Allergies  Allergen Reactions   Aspirin     Can not take due to previous GI bleed - per daughter    Review Of Systems:  Constitutional:   No  weight loss, night sweats,  Fevers, chills, fatigue, or  lassitude.  HEENT:   No headaches,  Difficulty swallowing,  Tooth/dental problems, or  Sore throat,                No sneezing, itching, ear ache, nasal congestion, post nasal drip,   CV:  No chest pain,  Orthopnea, PND, swelling in lower extremities,  anasarca, dizziness, palpitations, syncope.   GI  No heartburn, indigestion, abdominal pain, nausea, vomiting, diarrhea, change in bowel habits, loss of appetite, bloody stools.   Resp: No shortness of breath with exertion or at rest.  No excess mucus, no productive cough,  No non-productive cough,  No coughing up of blood.  No change  in color of mucus.  No wheezing.  No chest wall deformity  Skin: no rash or lesions.  GU: no dysuria, change in color of urine, no urgency or frequency.  No flank pain, no hematuria   MS:  No joint pain or swelling.  No decreased range of motion.  No back pain.  Psych:  No change in mood or affect. No depression or anxiety.  No memory loss.   Vital Signs There were no vitals taken for this visit.   Physical Exam:  General- No distress,  A&Ox3 ENT: No sinus tenderness, TM clear, pale nasal mucosa, no oral exudate,no post nasal drip, no LAN Cardiac: S1, S2, regular rate and rhythm, no murmur Chest: No wheeze/ rales/ dullness; no accessory muscle use, no nasal flaring, no sternal retractions Abd.: Soft Non-tender Ext: No clubbing cyanosis, edema Neuro:  normal strength Skin: No rashes, warm and dry Psych: normal mood and behavior   Assessment/Plan  No problem-specific Assessment & Plan notes found for this encounter.    Bevelyn Ngo, NP 11/05/2022  9:01 AM

## 2022-11-06 ENCOUNTER — Ambulatory Visit (HOSPITAL_COMMUNITY)
Admission: RE | Admit: 2022-11-06 | Discharge: 2022-11-06 | Disposition: A | Discharge status: Home / Self Care | Payer: 59 | Source: Ambulatory Visit | Attending: Pulmonary Disease | Admitting: Pulmonary Disease

## 2022-11-09 NOTE — Progress Notes (Unsigned)
[facility] [phone]   [phone]  CONSULT NOTE  REFERRING PHYSICIAN: Dr. Tonia Brooms  REASON FOR CONSULTATION:  Stage IV non-small cell lung cancer, squamous cell carcinoma  HPI Crystal Moore is a 77 y.o. female with a past medical history significant for CVA, gastric AVM, hypertension peripheral breast.  Disease, TIA, respiratory failure, COPD, interstitial lung disease, acute GI bleed, ischemic colitis, diabetes, dementia left hemiaplasia, sciatica, hyperlipidemia, and***is referred to the clinic for***   The patient presented to the emergency room on 09/22/2022 for strokelike symptoms.  A brain MRI showed no acute intracranial abnormality.  She does have generalized cerebral atrophy with chronic microvascular ischemic disease with multiple remote lacunar infarcts involving bilateral basal ganglia, thalami, and cerebellum. upon further evaluation her symptoms were found to be secondary toto seizure.  During the course of her hospitalization she had a CT scan of the chest that showed new bilateral pulmonary masses worrisome for neoplasm/metastatic disease.  The dominant mass in the left upper lobe.  There is also new mediastinal and bilateral hilar lymphadenopathy. ***She underwent a CT angio of the chest that day that showed a 7 cm mass in the left upper lobe abutting the mediastinum, pleura and hilum and a second mass in the right upper lobe measuring up to 3.5 cm, a third left lower lobe nodule measured 3.1 cm and there were patchy groundglass changes throughout the lung bases chronic appearing interstitial opacities were also noted in the lungs with a calcified granuloma in the right lower lobe.  She had diffuse mediastinal and bilateral hilar adenopathy, the largest right hilar node was 14 mm, a precarinal node was 13 mm, and prevascular lymph nodes measured up to 10 mm.  Marked left hilar adenopathy was present with the largest node measuring up to 2.6 cm.  *** CT of the abdomen pelvis does not show any worrisome findings in the abdominal cavity.   She had a PET scan 10/13/2022 that showed hypermetabolic masses, largest in the left upper lobe, hypermetabolic left hilar adenopath and no evidence of hyper metabolic metastatic disease in the abdomen or pelvis.   She underwent bronchoscopy on 10/27/2022 by Dr. Tonia Brooms.  The final pathology***showed squamous cell carcinoma in the 11 L node.  She is scheduled for repeat brain MRI on 11/06/2022 which showed ***  She met with radiation oncology on 11/04/2022.  She met with Dr. Mitzi Hansen and Crystal Moore.  They did not feel that the patient was a candidate for aggressive treatment.  In terms of radiation, they discussed the possibility of radiation to the left lung over 2 weeks.  The patient is accompanied by ***today.  The patient has had discussions about how aggressive they wish to be regarding systemic treatment.  They have opted for ***.  Overall, at baseline the patient is ***paralyzed and nonverbal?    PD-L1 is 1%.      HPI  Past Medical History:  Diagnosis Date   Abnormal gait    wheelchair bound   Anemia 06/24/2022   IDA   Arthritis    COPD (chronic obstructive pulmonary disease)    COVID-19 virus infection 07/2020   CVA (cerebral vascular accident) 09/22/2022   with residual left-sided weakness   Dementia    Depression    no current problem   Diabetes mellitus without complication    GERD (gastroesophageal reflux disease)    Headache    Hemiparesis affecting left Moore as late effect of cerebrovascular accident (CVA)  w/c bound   HOH (hard of hearing) 01/13/2018   no hearing aids   Hypertension    Mass of lung    bilateral masses   PAD (peripheral artery disease)    in CE   Pneumonia    x 4   Pyelonephritis    Seizures 09/25/2022   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   Sensorineural hearing loss    bilateral in CE, no hearing aids   Stroke    Tobacco use  disorder 2019   quit 2019   UTI (urinary tract infection)    Wheelchair dependent     Past Surgical History:  Procedure Laterality Date   ABDOMINAL AORTOGRAM W/LOWER EXTREMITY N/A 08/30/2020   Procedure: ABDOMINAL AORTOGRAM W/LOWER EXTREMITY;  Surgeon: Leonie Douglas, MD;  Location: [facility];  Service: Cardiovascular;  Laterality: N/A;   ABDOMINAL AORTOGRAM W/LOWER EXTREMITY N/A 09/04/2020   Procedure: ABDOMINAL AORTOGRAM W/LOWER EXTREMITY;  Surgeon: Leonie Douglas, MD;  Location: [facility];  Service: Cardiovascular;  Laterality: N/A;   ESOPHAGOGASTRODUODENOSCOPY (EGD) WITH PROPOFOL N/A 09/16/2020   Procedure: ESOPHAGOGASTRODUODENOSCOPY (EGD) WITH PROPOFOL;  Surgeon: Shellia Cleverly, DO;  Location: [facility];  Service: Gastroenterology;  Laterality: N/A;   FINE NEEDLE ASPIRATION  10/27/2022   Procedure: FINE NEEDLE ASPIRATION (FNA) LINEAR;  Surgeon: Josephine Igo, DO;  Location: [facility];  Service: Pulmonary;;   HEMOSTASIS CLIP PLACEMENT  09/16/2020   Procedure: HEMOSTASIS CLIP PLACEMENT;  Surgeon: Shellia Cleverly, DO;  Location: [facility];  Service: Gastroenterology;;   HOT HEMOSTASIS N/A 09/16/2020   Procedure: HOT HEMOSTASIS (ARGON PLASMA COAGULATION/BICAP);  Surgeon: Shellia Cleverly, DO;  Location: [facility];  Service: Gastroenterology;  Laterality: N/A;   PERIPHERAL VASCULAR INTERVENTION Right 08/30/2020   Procedure: PERIPHERAL VASCULAR INTERVENTION;  Surgeon: Leonie Douglas, MD;  Location: [facility];  Service: Cardiovascular;  Laterality: Right;  femoral popliteal   PERIPHERAL VASCULAR INTERVENTION Left 09/04/2020   Procedure: PERIPHERAL VASCULAR INTERVENTION;  Surgeon: Leonie Douglas, MD;  Location: [facility];  Service: Cardiovascular;  Laterality: Left;  SFA   TUBAL LIGATION     VIDEO BRONCHOSCOPY WITH ENDOBRONCHIAL ULTRASOUND Bilateral 10/27/2022   Procedure: VIDEO BRONCHOSCOPY WITH ENDOBRONCHIAL ULTRASOUND;  Surgeon: Josephine Igo, DO;  Location: [facility];  Service: Pulmonary;  Laterality: Bilateral;    Family History  Problem Relation Age of Onset   Diabetes Mother    Diabetes Sister    Diabetes Brother     Social History Social History   Tobacco Use   Smoking status: Former    Packs/day: 1.00    Years: 40.00    Additional pack years: 0.00    Total pack years: 40.00    Types: Cigarettes    Quit date: 2019    Years since quitting: 5.2   Smokeless tobacco: Never  Vaping Use   Vaping Use: Never used  Substance Use Topics   Alcohol use: Never   Drug use: Never    Allergies  Allergen Reactions   Aspirin     Can not take due to previous GI bleed - per daughter    Current Outpatient Medications  Medication Sig Dispense Refill   ACCU-CHEK GUIDE test strip USE TO test AS DIRECTED TWICE DAILY 200 strip 2   acetaminophen (TYLENOL) 500 MG tablet Take 1,000 mg by mouth every 6 (six) hours as needed (pain).     albuterol (PROVENTIL) (2.5 MG/3ML) 0.083% nebulizer solution Inhale 3 mLs (  2.5 mg total) into the lungs every 4 (four) hours as needed for wheezing or shortness of breath. 75 mL 12   albuterol (VENTOLIN HFA) 108 (90 Base) MCG/ACT inhaler Inhale 1 puff into the lungs every 6 (six) hours as needed for wheezing or shortness of breath.     amLODipine (NORVASC) 5 MG tablet Take 5 mg by mouth in the morning.     atorvastatin (LIPITOR) 80 MG tablet Take 1 tablet (80 mg total) by mouth daily at 6 PM. (Patient taking differently: Take 80 mg by mouth every evening.) 30 tablet 0   clopidogrel (PLAVIX) 75 MG tablet Take 1 tablet (75 mg total) by mouth daily. 30 tablet 11   feeding supplement (ENSURE ENLIVE / ENSURE PLUS) LIQD Take 237 mLs by mouth 2 (two) times daily between meals. 237 mL 12   ferrous sulfate 325 (65 FE) MG tablet Take 1 tablet (325 mg total) by mouth daily with breakfast. 30 tablet 3   folic acid (FOLVITE) 1 MG tablet Take 1 tablet (1 mg total) by mouth daily. 30 tablet 2    gabapentin (NEURONTIN) 300 MG capsule Take 1 capsule (300 mg total) by mouth 3 (three) times daily. 30 capsule 3   insulin glargine (LANTUS) 100 UNIT/ML injection Inject 0.15 mLs (15 Units total) into the skin daily. (Patient taking differently: Inject 25 Units into the skin daily.) 10 mL 11   linagliptin (TRADJENTA) 5 MG TABS tablet Take 5 mg by mouth daily.     pantoprazole (PROTONIX) 40 MG tablet Take 40 mg by mouth 2 (two) times daily.     No current facility-administered medications for this visit.    REVIEW OF SYSTEMS:   Review of Systems  Constitutional: Negative for appetite change, chills, fatigue, fever and unexpected weight change.  HENT:   Negative for mouth sores, nosebleeds, sore throat and trouble swallowing.   Eyes: Negative for eye problems and icterus.  Respiratory: Negative for cough, hemoptysis, shortness of breath and wheezing.   Cardiovascular: Negative for chest pain and leg swelling.  Gastrointestinal: Negative for abdominal pain, constipation, diarrhea, nausea and vomiting.  Genitourinary: Negative for bladder incontinence, difficulty urinating, dysuria, frequency and hematuria.   Musculoskeletal: Negative for back pain, gait problem, neck pain and neck stiffness.  Skin: Negative for itching and rash.  Neurological: Negative for dizziness, extremity weakness, gait problem, headaches, light-headedness and seizures.  Hematological: Negative for adenopathy. Does not bruise/bleed easily.  Psychiatric/Behavioral: Negative for confusion, depression and sleep disturbance. The patient is not nervous/anxious.     PHYSICAL EXAMINATION:  There were no vitals taken for this visit.  ECOG PERFORMANCE STATUS: {CHL ONC ECOG [id number]  Physical Exam  Constitutional: Oriented to person, place, and time and well-developed, well-nourished, and in no distress. No distress.  HENT:  Head: Normocephalic and atraumatic.  Mouth/Throat: Oropharynx is clear and moist. No  oropharyngeal exudate.  Eyes: Conjunctivae are normal. Right eye exhibits no discharge. Left eye exhibits no discharge. No scleral icterus.  Neck: Normal range of motion. Neck supple.  Cardiovascular: Normal rate, regular rhythm, normal heart sounds and intact distal pulses.   Pulmonary/Chest: Effort normal and breath sounds normal. No respiratory distress. No wheezes. No rales.  Abdominal: Soft. Bowel sounds are normal. Exhibits no distension and no mass. There is no tenderness.  Musculoskeletal: Normal range of motion. Exhibits no edema.  Lymphadenopathy:    No cervical adenopathy.  Neurological: Alert and oriented to person, place, and time. Exhibits normal muscle tone. Gait normal. Coordination normal.  Skin: Skin is warm and dry. No rash noted. Not diaphoretic. No erythema. No pallor.  Psychiatric: Mood, memory and judgment normal.  Vitals reviewed.  LABORATORY DATA: Lab Results  Component Value Date   WBC 10.2 10/27/2022   HGB 11.5 (L) 10/27/2022   HCT 38.3 10/27/2022   MCV 76.6 (L) 10/27/2022   PLT 290 10/27/2022      Chemistry      Component Value Date/Time   NA 138 10/27/2022 1018   K 4.4 10/27/2022 1018   CL 105 10/27/2022 1018   CO2 21 (L) 10/27/2022 1018   BUN 18 10/27/2022 1018   CREATININE 0.75 10/27/2022 1018      Component Value Date/Time   CALCIUM 9.1 10/27/2022 1018   ALKPHOS 87 09/22/2022 1750   AST 18 09/22/2022 1750   ALT 20 09/22/2022 1750   BILITOT 0.4 09/22/2022 1750       RADIOGRAPHIC STUDIES: DG CHEST PORT 1 VIEW  Result Date: 10/27/2022 CLINICAL DATA:  Status post bronchoscopy. EXAM: PORTABLE CHEST 1 VIEW COMPARISON:  09/22/2022 FINDINGS: Stable multiple pulmonary masses. No pneumothorax, hematoma pleural effusion. IMPRESSION: 1. Stable pulmonary masses. 2. No pneumothorax or hematoma. Electronically Signed   By: Rudie Meyer M.D.   On: 10/27/2022 15:46   NM PET SUPER D CT  Result Date: 10/15/2022 CLINICAL DATA:  Lung masses; * Tracking  Code: BO * EXAM: CT CHEST WITHOUT CONTRAST TECHNIQUE: Multidetector CT imaging of the chest was performed using thin slice collimation for electromagnetic bronchoscopy planning purposes, without intravenous contrast. RADIATION DOSE REDUCTION: This exam was performed according to the departmental dose-optimization program which includes automated exposure control, adjustment of the mA and/or kV according to patient size and/or use of iterative reconstruction technique. COMPARISON:  Chest CT dated September 22, 2020 FINDINGS: Cardiovascular: Normal heart size. No pericardial effusion. Normal caliber thoracic aorta with severe calcified plaque. Severe coronary artery calcifications. Mediastinum/Nodes: Small hiatal hernia. Thyroid is unremarkable. Asymmetric left hilar soft tissue, likely due to conglomerate hilar adenopathy. Reference left hilar soft tissue located posterior to the left mainstem bronchus measuring 1.8 x 1.9 cm on series 2, image 29. Lungs/Pleura: Moderate centrilobular emphysema. Occlusion of the superior left upper lobe bronchi. Remaining central airways are patent. Bilateral pulmonary masses, unchanged when compared with prior exam. Left upper lobe mass measuring 7.1 x 4.5 cm on series 4, image 66. Right upper lobe irregular mass measuring 3.5 x 3.2 cm on image 43. Left lower lobe mass measuring 3.1 x 2.3 cm on image 64. Calcified granuloma of the right lower lobe. Unchanged peribronchovascular predominant reticular opacities traction bronchiectasis, most pronounced in the lower lungs. No pleural effusion or pneumothorax. Upper Abdomen: No acute abnormality. Musculoskeletal: Unchanged moderate compression deformity of L1. no aggressive appearing osseous lesions. IMPRESSION: 1. Stable bilateral pulmonary masses, largest is located in the left upper lobe. 2. Stable left hilar adenopathy. 3. Peribronchovascular reticular opacities and traction bronchiectasis, likely due to fibrotic interstitial lung  disease. 4. Aortic Atherosclerosis (ICD10-I70.0) and Emphysema (ICD10-J43.9). Electronically Signed   By: Allegra Lai M.D.   On: 10/15/2022 09:40   NM PET Image Initial (PI) Skull Base To Thigh  Result Date: 10/15/2022 CLINICAL DATA:  Initial treatment strategy for lung masses. EXAM: NUCLEAR MEDICINE PET SKULL BASE TO THIGH TECHNIQUE: 6.6 mCi F-18 FDG was injected intravenously. Full-ring PET imaging was performed from the skull base to thigh after the radiotracer. CT data was obtained and used for attenuation correction and anatomic localization. Fasting blood glucose: 206 mg/dl COMPARISON:  Same day chest CT; chest CT dated September 22, 2022; CT abdomen and pelvis dated September 23, 2022 FINDINGS: Mediastinal blood pool activity: SUV max 2.9 Liver activity: SUV max NA NECK: Limited evaluation due to severe misregistration artifact related to patient motion. No hypermetabolic lymph nodes in the neck. Incidental CT findings: None. CHEST: Bilateral hypermetabolic pulmonary masses, unchanged in size when compared with prior exam. Left upper lobe mass has an SUV max of 12.2. Right upper lobe mass has SUV max of 0.6. Left lower lobe mass has an SUV max of 10.3. Hypermetabolic left hilar soft tissue, hilar adenopathy. Reference hilar soft tissue located posterior to the left mainstem with SUV max of 7.4. Incidental CT findings: Crystal same day separately dictated chest CT. ABDOMEN/PELVIS: No abnormal hypermetabolic activity within the liver, pancreas, adrenal glands, or spleen. No hypermetabolic lymph nodes in the abdomen or pelvis. Incidental CT findings: Simple appearing cyst of the right kidney, no specific follow-up imaging is recommended. Diverticulosis. Normal caliber abdominal aorta with severe calcified plaque. SKELETON: No focal hypermetabolic activity to suggest skeletal metastasis. Incidental CT findings: None. IMPRESSION: 1. Bilateral hypermetabolic pulmonary masses, largest is located in the left upper  lobe. 2. Hypermetabolic left hilar adenopathy. 3. No evidence of hypermetabolic metastatic disease in the abdomen or pelvis. 4. Aortic Atherosclerosis (ICD10-I70.0). Electronically Signed   By: Allegra Lai M.D.   On: 10/15/2022 09:38    ASSESSMENT: This is a very pleasant 77 year old female diagnosed with stage IV (T***, N***, M1a) non-small cell lung cancer, squamous cell carcinoma.  She presented with bilateral pulmonary masses, the largest in the left upper lobe, but she also has right upper lobe mass and left lower lobe mass and left hilar adenopathy.  Her PD-L1 expression is 1%.  The patient already met with radiation oncology on 11/04/2022.  They would consider a course of 2-week palliative radiation to the left upper lobe mass.  The patient is likely not a candidate for aggressive therapies given her poor performance status.  The patient was seen with Dr. Arbutus Ped today.  Dr. Arbutus Ped had a lengthy discussion with the patient today about her current condition and recommended treatment options.  Unfortunately, the patient's PD-L1 expression is 1%.  The patient likely be a poor candidate for systemic treatment options given her poor performance status.  Dr. Arbutus Ped strongly encouraged the patient and her family to consider referral to palliative care and hospice or at least palliative radiation followed by palliative care and hospice.  The patient and her family opted to ***  I will arrange for ***  Follow-up***    The patient voices understanding of current disease status and treatment options and is in agreement with the current care plan.  All questions were answered. The patient knows to call the clinic with any problems, questions or concerns. We can certainly Crystal the patient much sooner if necessary.  Thank you so much for allowing me to participate in the care of Crystal Moore. I will continue to follow up the patient with you and assist in her care.  I spent {CHL ONC TIME  VISIT - [id number]} counseling the patient face to face. The total time spent in the appointment was {CHL ONC TIME VISIT - [id number]}.  Disclaimer: This note was dictated with voice recognition software. Similar sounding words can inadvertently be transcribed and may not be corrected upon review.   Dann Galicia L Ebonique Hallstrom November 09, 2022, 1:03 PM

## 2022-11-10 DIAGNOSIS — C3490 Malignant neoplasm of unspecified part of unspecified bronchus or lung: Secondary | ICD-10-CM | POA: Diagnosis not present

## 2022-11-11 ENCOUNTER — Other Ambulatory Visit: Payer: Self-pay

## 2022-11-11 ENCOUNTER — Inpatient Hospital Stay (HOSPITAL_BASED_OUTPATIENT_CLINIC_OR_DEPARTMENT_OTHER): Payer: 59 | Admitting: Physician Assistant

## 2022-11-11 ENCOUNTER — Inpatient Hospital Stay: Payer: 59 | Attending: Physician Assistant

## 2022-11-11 ENCOUNTER — Encounter: Payer: Self-pay | Admitting: Radiation Oncology

## 2022-11-11 VITALS — BP 137/51 | HR 78 | Temp 98.2°F | Resp 16 | Ht 62.0 in | Wt 141.0 lb

## 2022-11-11 DIAGNOSIS — Z87891 Personal history of nicotine dependence: Secondary | ICD-10-CM | POA: Diagnosis not present

## 2022-11-11 DIAGNOSIS — C3412 Malignant neoplasm of upper lobe, left bronchus or lung: Secondary | ICD-10-CM | POA: Diagnosis not present

## 2022-11-11 DIAGNOSIS — I10 Essential (primary) hypertension: Secondary | ICD-10-CM | POA: Insufficient documentation

## 2022-11-11 DIAGNOSIS — E119 Type 2 diabetes mellitus without complications: Secondary | ICD-10-CM

## 2022-11-11 DIAGNOSIS — Z8042 Family history of malignant neoplasm of prostate: Secondary | ICD-10-CM

## 2022-11-11 DIAGNOSIS — R918 Other nonspecific abnormal finding of lung field: Secondary | ICD-10-CM

## 2022-11-11 DIAGNOSIS — Z7189 Other specified counseling: Secondary | ICD-10-CM | POA: Insufficient documentation

## 2022-11-11 LAB — CBC WITH DIFFERENTIAL (CANCER CENTER ONLY)
Abs Immature Granulocytes: 0.03 10*3/uL (ref 0.00–0.07)
Basophils Absolute: 0.1 10*3/uL (ref 0.0–0.1)
Basophils Relative: 1 %
Eosinophils Absolute: 0.3 10*3/uL (ref 0.0–0.5)
Eosinophils Relative: 3 %
HCT: 36.3 % (ref 36.0–46.0)
Hemoglobin: 11.1 g/dL — ABNORMAL LOW (ref 12.0–15.0)
Immature Granulocytes: 0 %
Lymphocytes Relative: 26 %
Lymphs Abs: 2.6 10*3/uL (ref 0.7–4.0)
MCH: 23.3 pg — ABNORMAL LOW (ref 26.0–34.0)
MCHC: 30.6 g/dL (ref 30.0–36.0)
MCV: 76.1 fL — ABNORMAL LOW (ref 80.0–100.0)
Monocytes Absolute: 0.5 10*3/uL (ref 0.1–1.0)
Monocytes Relative: 5 %
Neutro Abs: 6.5 10*3/uL (ref 1.7–7.7)
Neutrophils Relative %: 65 %
Platelet Count: 296 10*3/uL (ref 150–400)
RBC: 4.77 MIL/uL (ref 3.87–5.11)
RDW: 22 % — ABNORMAL HIGH (ref 11.5–15.5)
WBC Count: 10 10*3/uL (ref 4.0–10.5)
nRBC: 0 % (ref 0.0–0.2)

## 2022-11-11 LAB — CMP (CANCER CENTER ONLY)
ALT: 18 U/L (ref 0–44)
AST: 13 U/L — ABNORMAL LOW (ref 15–41)
Albumin: 3.4 g/dL — ABNORMAL LOW (ref 3.5–5.0)
Alkaline Phosphatase: 92 U/L (ref 38–126)
Anion gap: 5 (ref 5–15)
BUN: 28 mg/dL — ABNORMAL HIGH (ref 8–23)
CO2: 29 mmol/L (ref 22–32)
Calcium: 9.8 mg/dL (ref 8.9–10.3)
Chloride: 108 mmol/L (ref 98–111)
Creatinine: 1.03 mg/dL — ABNORMAL HIGH (ref 0.44–1.00)
GFR, Estimated: 56 mL/min — ABNORMAL LOW (ref >60)
Glucose, Bld: 116 mg/dL — ABNORMAL HIGH (ref 70–99)
Potassium: 4.3 mmol/L (ref 3.5–5.1)
Sodium: 142 mmol/L (ref 135–145)
Total Bilirubin: 0.3 mg/dL (ref 0.3–1.2)
Total Protein: 8.6 g/dL — ABNORMAL HIGH (ref 6.5–8.1)

## 2022-11-11 NOTE — Progress Notes (Signed)
NN met with pt today face to face at her consult appt with Cassie, PA and Dr.Mohamed. Pt is accompanied by her daughter, Crystal Moore, and health aide, Marcelino Duster. NN made self introduction and explained the role of a NN in the context of cancer care. The plan for the pt is for her to receive radiation only as she is not interested in pursuing chemotherapy. Referral for Rad Onc placed. Hospice was briefly discussed and at this time with the NN, and the pts daughter does not think the pt needs it "she isnt there yet" and "we can take care of her", and won't need any outside assistance.  Patient has an excellent support system, with all 4 of her children involved in her care. NN verbalized understanding. The pt and her daughter deny any needs at this time, however NN provided Crystal Moore with contact information and encouraged her to reach out with any questions or concerns.

## 2022-11-11 NOTE — Progress Notes (Signed)
We received communication from medical oncology that the patient is not a candidate for systemic treatment but her family has elected to proceed with palliative radiation as we discussed last week. I've placed orders for simulation and our staff will contact her daughter to schedule simulation for 2 weeks of palliative radiation to the lung.

## 2022-11-13 ENCOUNTER — Encounter (HOSPITAL_COMMUNITY): Payer: Self-pay

## 2022-11-17 ENCOUNTER — Ambulatory Visit
Admission: RE | Admit: 2022-11-17 | Discharge: 2022-11-17 | Disposition: A | Discharge status: Home / Self Care | Payer: 59 | Source: Ambulatory Visit | Attending: Radiation Oncology | Admitting: Radiation Oncology

## 2022-11-17 DIAGNOSIS — E11622 Type 2 diabetes mellitus with other skin ulcer: Secondary | ICD-10-CM | POA: Diagnosis not present

## 2022-11-17 DIAGNOSIS — Z510 Encounter for antineoplastic radiation therapy: Secondary | ICD-10-CM | POA: Insufficient documentation

## 2022-11-17 DIAGNOSIS — J9611 Chronic respiratory failure with hypoxia: Secondary | ICD-10-CM | POA: Diagnosis not present

## 2022-11-17 DIAGNOSIS — I480 Paroxysmal atrial fibrillation: Secondary | ICD-10-CM | POA: Diagnosis not present

## 2022-11-17 DIAGNOSIS — Z87891 Personal history of nicotine dependence: Secondary | ICD-10-CM | POA: Insufficient documentation

## 2022-11-17 DIAGNOSIS — C3412 Malignant neoplasm of upper lobe, left bronchus or lung: Secondary | ICD-10-CM | POA: Diagnosis not present

## 2022-11-17 DIAGNOSIS — L98499 Non-pressure chronic ulcer of skin of other sites with unspecified severity: Secondary | ICD-10-CM | POA: Diagnosis not present

## 2022-11-17 DIAGNOSIS — I10 Essential (primary) hypertension: Secondary | ICD-10-CM | POA: Diagnosis not present

## 2022-11-17 DIAGNOSIS — B372 Candidiasis of skin and nail: Secondary | ICD-10-CM | POA: Diagnosis not present

## 2022-11-17 DIAGNOSIS — E039 Hypothyroidism, unspecified: Secondary | ICD-10-CM | POA: Diagnosis not present

## 2022-11-17 DIAGNOSIS — K219 Gastro-esophageal reflux disease without esophagitis: Secondary | ICD-10-CM | POA: Diagnosis not present

## 2022-11-17 DIAGNOSIS — E119 Type 2 diabetes mellitus without complications: Secondary | ICD-10-CM | POA: Diagnosis not present

## 2022-11-17 DIAGNOSIS — Z930 Tracheostomy status: Secondary | ICD-10-CM | POA: Diagnosis not present

## 2022-11-17 DIAGNOSIS — G6281 Critical illness polyneuropathy: Secondary | ICD-10-CM | POA: Diagnosis not present

## 2022-11-17 DIAGNOSIS — J9612 Chronic respiratory failure with hypercapnia: Secondary | ICD-10-CM | POA: Diagnosis not present

## 2022-11-17 DIAGNOSIS — L89154 Pressure ulcer of sacral region, stage 4: Secondary | ICD-10-CM | POA: Diagnosis not present

## 2022-11-17 DIAGNOSIS — R600 Localized edema: Secondary | ICD-10-CM | POA: Diagnosis not present

## 2022-11-18 NOTE — Progress Notes (Signed)
Discussed at recent office visit with SG  She has onc follow up already   Thanks,  BLI  Josephine Igo, DO [facility] 11/18/2022 10:57 AM

## 2022-11-20 DIAGNOSIS — R600 Localized edema: Secondary | ICD-10-CM | POA: Diagnosis not present

## 2022-11-20 DIAGNOSIS — I10 Essential (primary) hypertension: Secondary | ICD-10-CM | POA: Diagnosis not present

## 2022-11-20 DIAGNOSIS — I480 Paroxysmal atrial fibrillation: Secondary | ICD-10-CM | POA: Diagnosis not present

## 2022-11-20 DIAGNOSIS — J9612 Chronic respiratory failure with hypercapnia: Secondary | ICD-10-CM | POA: Diagnosis not present

## 2022-11-20 DIAGNOSIS — Z510 Encounter for antineoplastic radiation therapy: Secondary | ICD-10-CM | POA: Diagnosis not present

## 2022-11-20 DIAGNOSIS — J9611 Chronic respiratory failure with hypoxia: Secondary | ICD-10-CM | POA: Diagnosis not present

## 2022-11-20 DIAGNOSIS — C3412 Malignant neoplasm of upper lobe, left bronchus or lung: Secondary | ICD-10-CM | POA: Diagnosis not present

## 2022-11-20 DIAGNOSIS — B372 Candidiasis of skin and nail: Secondary | ICD-10-CM | POA: Diagnosis not present

## 2022-11-20 DIAGNOSIS — Z87891 Personal history of nicotine dependence: Secondary | ICD-10-CM | POA: Diagnosis not present

## 2022-11-20 DIAGNOSIS — Z930 Tracheostomy status: Secondary | ICD-10-CM | POA: Diagnosis not present

## 2022-11-20 DIAGNOSIS — E119 Type 2 diabetes mellitus without complications: Secondary | ICD-10-CM | POA: Diagnosis not present

## 2022-11-20 DIAGNOSIS — E039 Hypothyroidism, unspecified: Secondary | ICD-10-CM | POA: Diagnosis not present

## 2022-11-20 DIAGNOSIS — E11622 Type 2 diabetes mellitus with other skin ulcer: Secondary | ICD-10-CM | POA: Diagnosis not present

## 2022-11-20 DIAGNOSIS — L89154 Pressure ulcer of sacral region, stage 4: Secondary | ICD-10-CM | POA: Diagnosis not present

## 2022-11-20 DIAGNOSIS — K219 Gastro-esophageal reflux disease without esophagitis: Secondary | ICD-10-CM | POA: Diagnosis not present

## 2022-11-20 DIAGNOSIS — L98499 Non-pressure chronic ulcer of skin of other sites with unspecified severity: Secondary | ICD-10-CM | POA: Diagnosis not present

## 2022-11-20 DIAGNOSIS — G6281 Critical illness polyneuropathy: Secondary | ICD-10-CM | POA: Diagnosis not present

## 2022-11-24 ENCOUNTER — Other Ambulatory Visit: Payer: Self-pay

## 2022-11-24 ENCOUNTER — Ambulatory Visit
Admission: RE | Admit: 2022-11-24 | Discharge: 2022-11-24 | Disposition: A | Discharge status: Home / Self Care | Payer: 59 | Source: Ambulatory Visit | Attending: Radiation Oncology | Admitting: Radiation Oncology

## 2022-11-24 DIAGNOSIS — L89154 Pressure ulcer of sacral region, stage 4: Secondary | ICD-10-CM | POA: Diagnosis not present

## 2022-11-24 DIAGNOSIS — I10 Essential (primary) hypertension: Secondary | ICD-10-CM | POA: Diagnosis not present

## 2022-11-24 DIAGNOSIS — Z87891 Personal history of nicotine dependence: Secondary | ICD-10-CM | POA: Diagnosis not present

## 2022-11-24 DIAGNOSIS — J9612 Chronic respiratory failure with hypercapnia: Secondary | ICD-10-CM | POA: Diagnosis not present

## 2022-11-24 DIAGNOSIS — B372 Candidiasis of skin and nail: Secondary | ICD-10-CM | POA: Diagnosis not present

## 2022-11-24 DIAGNOSIS — E119 Type 2 diabetes mellitus without complications: Secondary | ICD-10-CM | POA: Diagnosis not present

## 2022-11-24 DIAGNOSIS — Z930 Tracheostomy status: Secondary | ICD-10-CM | POA: Diagnosis not present

## 2022-11-24 DIAGNOSIS — J9611 Chronic respiratory failure with hypoxia: Secondary | ICD-10-CM | POA: Diagnosis not present

## 2022-11-24 DIAGNOSIS — C3412 Malignant neoplasm of upper lobe, left bronchus or lung: Secondary | ICD-10-CM | POA: Diagnosis not present

## 2022-11-24 DIAGNOSIS — G6281 Critical illness polyneuropathy: Secondary | ICD-10-CM | POA: Diagnosis not present

## 2022-11-24 DIAGNOSIS — E11622 Type 2 diabetes mellitus with other skin ulcer: Secondary | ICD-10-CM | POA: Diagnosis not present

## 2022-11-24 DIAGNOSIS — K219 Gastro-esophageal reflux disease without esophagitis: Secondary | ICD-10-CM | POA: Diagnosis not present

## 2022-11-24 DIAGNOSIS — E039 Hypothyroidism, unspecified: Secondary | ICD-10-CM | POA: Diagnosis not present

## 2022-11-24 DIAGNOSIS — R600 Localized edema: Secondary | ICD-10-CM | POA: Diagnosis not present

## 2022-11-24 DIAGNOSIS — I480 Paroxysmal atrial fibrillation: Secondary | ICD-10-CM | POA: Diagnosis not present

## 2022-11-24 DIAGNOSIS — Z510 Encounter for antineoplastic radiation therapy: Secondary | ICD-10-CM | POA: Diagnosis not present

## 2022-11-24 DIAGNOSIS — L98499 Non-pressure chronic ulcer of skin of other sites with unspecified severity: Secondary | ICD-10-CM | POA: Diagnosis not present

## 2022-11-24 LAB — RAD ONC ARIA SESSION SUMMARY
Course Elapsed Days: 0
Plan Fractions Treated to Date: 1
Plan Prescribed Dose Per Fraction: 3 Gy
Plan Total Fractions Prescribed: 10
Plan Total Prescribed Dose: 30 Gy
Reference Point Dosage Given to Date: 3 Gy
Reference Point Session Dosage Given: 3 Gy
Session Number: 1

## 2022-11-25 ENCOUNTER — Ambulatory Visit
Admission: RE | Admit: 2022-11-25 | Discharge: 2022-11-25 | Disposition: A | Discharge status: Home / Self Care | Payer: 59 | Source: Ambulatory Visit | Attending: Radiation Oncology | Admitting: Radiation Oncology

## 2022-11-25 ENCOUNTER — Other Ambulatory Visit: Payer: Self-pay

## 2022-11-25 DIAGNOSIS — Z510 Encounter for antineoplastic radiation therapy: Secondary | ICD-10-CM | POA: Diagnosis not present

## 2022-11-25 DIAGNOSIS — C3412 Malignant neoplasm of upper lobe, left bronchus or lung: Secondary | ICD-10-CM | POA: Insufficient documentation

## 2022-11-25 DIAGNOSIS — Z87891 Personal history of nicotine dependence: Secondary | ICD-10-CM | POA: Insufficient documentation

## 2022-11-25 LAB — RAD ONC ARIA SESSION SUMMARY
Course Elapsed Days: 1
Plan Fractions Treated to Date: 2
Plan Prescribed Dose Per Fraction: 3 Gy
Plan Total Fractions Prescribed: 10
Plan Total Prescribed Dose: 30 Gy
Reference Point Dosage Given to Date: 6 Gy
Reference Point Session Dosage Given: 3 Gy
Session Number: 2

## 2022-11-26 ENCOUNTER — Other Ambulatory Visit: Payer: Self-pay

## 2022-11-26 ENCOUNTER — Ambulatory Visit
Admission: RE | Admit: 2022-11-26 | Discharge: 2022-11-26 | Disposition: A | Discharge status: Home / Self Care | Payer: 59 | Source: Ambulatory Visit | Attending: Radiation Oncology | Admitting: Radiation Oncology

## 2022-11-26 DIAGNOSIS — Z510 Encounter for antineoplastic radiation therapy: Secondary | ICD-10-CM | POA: Diagnosis not present

## 2022-11-26 DIAGNOSIS — Z87891 Personal history of nicotine dependence: Secondary | ICD-10-CM | POA: Diagnosis not present

## 2022-11-26 DIAGNOSIS — C3412 Malignant neoplasm of upper lobe, left bronchus or lung: Secondary | ICD-10-CM | POA: Diagnosis not present

## 2022-11-26 LAB — RAD ONC ARIA SESSION SUMMARY
Course Elapsed Days: 2
Plan Fractions Treated to Date: 3
Plan Prescribed Dose Per Fraction: 3 Gy
Plan Total Fractions Prescribed: 10
Plan Total Prescribed Dose: 30 Gy
Reference Point Dosage Given to Date: 9 Gy
Reference Point Session Dosage Given: 3 Gy
Session Number: 3

## 2022-11-27 ENCOUNTER — Other Ambulatory Visit: Payer: Self-pay

## 2022-11-27 ENCOUNTER — Ambulatory Visit
Admission: RE | Admit: 2022-11-27 | Discharge: 2022-11-27 | Disposition: A | Discharge status: Home / Self Care | Payer: 59 | Source: Ambulatory Visit | Attending: Radiation Oncology | Admitting: Radiation Oncology

## 2022-11-27 DIAGNOSIS — Z510 Encounter for antineoplastic radiation therapy: Secondary | ICD-10-CM | POA: Diagnosis not present

## 2022-11-27 DIAGNOSIS — Z87891 Personal history of nicotine dependence: Secondary | ICD-10-CM | POA: Diagnosis not present

## 2022-11-27 DIAGNOSIS — C3412 Malignant neoplasm of upper lobe, left bronchus or lung: Secondary | ICD-10-CM | POA: Diagnosis not present

## 2022-11-27 LAB — RAD ONC ARIA SESSION SUMMARY
Course Elapsed Days: 3
Plan Fractions Treated to Date: 4
Plan Prescribed Dose Per Fraction: 3 Gy
Plan Total Fractions Prescribed: 10
Plan Total Prescribed Dose: 30 Gy
Reference Point Dosage Given to Date: 12 Gy
Reference Point Session Dosage Given: 3 Gy
Session Number: 4

## 2022-11-30 ENCOUNTER — Other Ambulatory Visit: Payer: Self-pay

## 2022-11-30 ENCOUNTER — Ambulatory Visit
Admission: RE | Admit: 2022-11-30 | Discharge: 2022-11-30 | Disposition: A | Discharge status: Home / Self Care | Payer: 59 | Source: Ambulatory Visit | Attending: Radiation Oncology | Admitting: Radiation Oncology

## 2022-11-30 DIAGNOSIS — C3412 Malignant neoplasm of upper lobe, left bronchus or lung: Secondary | ICD-10-CM | POA: Diagnosis not present

## 2022-11-30 DIAGNOSIS — Z510 Encounter for antineoplastic radiation therapy: Secondary | ICD-10-CM | POA: Diagnosis not present

## 2022-11-30 DIAGNOSIS — Z87891 Personal history of nicotine dependence: Secondary | ICD-10-CM | POA: Diagnosis not present

## 2022-11-30 LAB — RAD ONC ARIA SESSION SUMMARY
Course Elapsed Days: 6
Plan Fractions Treated to Date: 5
Plan Prescribed Dose Per Fraction: 3 Gy
Plan Total Fractions Prescribed: 10
Plan Total Prescribed Dose: 30 Gy
Reference Point Dosage Given to Date: 15 Gy
Reference Point Session Dosage Given: 3 Gy
Session Number: 5

## 2022-12-01 ENCOUNTER — Encounter: Payer: Self-pay | Admitting: Radiation Oncology

## 2022-12-01 ENCOUNTER — Other Ambulatory Visit: Payer: Self-pay

## 2022-12-01 ENCOUNTER — Ambulatory Visit
Admission: RE | Admit: 2022-12-01 | Discharge: 2022-12-01 | Disposition: A | Discharge status: Home / Self Care | Payer: 59 | Source: Ambulatory Visit | Attending: Radiation Oncology | Admitting: Radiation Oncology

## 2022-12-01 DIAGNOSIS — Z510 Encounter for antineoplastic radiation therapy: Secondary | ICD-10-CM | POA: Diagnosis not present

## 2022-12-01 DIAGNOSIS — C3412 Malignant neoplasm of upper lobe, left bronchus or lung: Secondary | ICD-10-CM | POA: Diagnosis not present

## 2022-12-01 DIAGNOSIS — Z87891 Personal history of nicotine dependence: Secondary | ICD-10-CM | POA: Diagnosis not present

## 2022-12-01 LAB — RAD ONC ARIA SESSION SUMMARY
Course Elapsed Days: 7
Plan Fractions Treated to Date: 6
Plan Prescribed Dose Per Fraction: 3 Gy
Plan Total Fractions Prescribed: 10
Plan Total Prescribed Dose: 30 Gy
Reference Point Dosage Given to Date: 18 Gy
Reference Point Session Dosage Given: 3 Gy
Session Number: 6

## 2022-12-02 ENCOUNTER — Other Ambulatory Visit: Payer: Self-pay

## 2022-12-02 ENCOUNTER — Ambulatory Visit
Admission: RE | Admit: 2022-12-02 | Discharge: 2022-12-02 | Disposition: A | Discharge status: Home / Self Care | Payer: 59 | Source: Ambulatory Visit | Attending: Radiation Oncology | Admitting: Radiation Oncology

## 2022-12-02 DIAGNOSIS — Z510 Encounter for antineoplastic radiation therapy: Secondary | ICD-10-CM | POA: Diagnosis not present

## 2022-12-02 DIAGNOSIS — C3412 Malignant neoplasm of upper lobe, left bronchus or lung: Secondary | ICD-10-CM | POA: Diagnosis not present

## 2022-12-02 DIAGNOSIS — Z87891 Personal history of nicotine dependence: Secondary | ICD-10-CM | POA: Diagnosis not present

## 2022-12-02 LAB — RAD ONC ARIA SESSION SUMMARY
Course Elapsed Days: 8
Plan Fractions Treated to Date: 7
Plan Prescribed Dose Per Fraction: 3 Gy
Plan Total Fractions Prescribed: 10
Plan Total Prescribed Dose: 30 Gy
Reference Point Dosage Given to Date: 21 Gy
Reference Point Session Dosage Given: 3 Gy
Session Number: 7

## 2022-12-02 NOTE — Progress Notes (Signed)
Pt is currently receiving palliative radiation to the left lung and regional node centrally for a diagnosis of lung cancer. She has a history of multiple strokes and has left UE paralysis and contraction of the left hand and arm, but apparently has some spasticity as well. She's been treated supine with her arms at her sides, and her treatment has gone well to date, she's had 5 of the planned 10 treatments thusfar, and our staff indicated left arm may have come loose during one of her treatments and were concerned about blistering she's developed in the left forearm. While this would be unlikely given the dose she's completed, she's seen to discuss care of this. Her children state she's not had any other incident of injury to the site.     On exam there are multiple areas of vesicular change along the left wrist and forearm. The areas are intact without radiation dermatitis present, and no cellulitic changes are appreciated.   While this is not likely caused by her radiation, we will follow this closely and we gave silvadene to use over the site with telfa and light gauze wrapping to protect the area. We expect this will be self limiting.      Osker Mason, PAC

## 2022-12-03 ENCOUNTER — Ambulatory Visit
Admission: RE | Admit: 2022-12-03 | Discharge: 2022-12-03 | Disposition: A | Discharge status: Home / Self Care | Payer: 59 | Source: Ambulatory Visit | Attending: Radiation Oncology | Admitting: Radiation Oncology

## 2022-12-03 ENCOUNTER — Other Ambulatory Visit: Payer: Self-pay

## 2022-12-03 DIAGNOSIS — Z510 Encounter for antineoplastic radiation therapy: Secondary | ICD-10-CM | POA: Diagnosis not present

## 2022-12-03 DIAGNOSIS — Z87891 Personal history of nicotine dependence: Secondary | ICD-10-CM | POA: Diagnosis not present

## 2022-12-03 DIAGNOSIS — C3412 Malignant neoplasm of upper lobe, left bronchus or lung: Secondary | ICD-10-CM | POA: Diagnosis not present

## 2022-12-03 LAB — RAD ONC ARIA SESSION SUMMARY
Course Elapsed Days: 9
Plan Fractions Treated to Date: 8
Plan Prescribed Dose Per Fraction: 3 Gy
Plan Total Fractions Prescribed: 10
Plan Total Prescribed Dose: 30 Gy
Reference Point Dosage Given to Date: 24 Gy
Reference Point Session Dosage Given: 3 Gy
Session Number: 8

## 2022-12-04 ENCOUNTER — Other Ambulatory Visit: Payer: Self-pay

## 2022-12-04 ENCOUNTER — Ambulatory Visit
Admission: RE | Admit: 2022-12-04 | Discharge: 2022-12-04 | Disposition: A | Discharge status: Home / Self Care | Payer: 59 | Source: Ambulatory Visit | Attending: Radiation Oncology | Admitting: Radiation Oncology

## 2022-12-04 DIAGNOSIS — Z87891 Personal history of nicotine dependence: Secondary | ICD-10-CM | POA: Diagnosis not present

## 2022-12-04 DIAGNOSIS — C3412 Malignant neoplasm of upper lobe, left bronchus or lung: Secondary | ICD-10-CM | POA: Diagnosis not present

## 2022-12-04 DIAGNOSIS — Z510 Encounter for antineoplastic radiation therapy: Secondary | ICD-10-CM | POA: Diagnosis not present

## 2022-12-04 LAB — RAD ONC ARIA SESSION SUMMARY
Course Elapsed Days: 10
Plan Fractions Treated to Date: 9
Plan Prescribed Dose Per Fraction: 3 Gy
Plan Total Fractions Prescribed: 10
Plan Total Prescribed Dose: 30 Gy
Reference Point Dosage Given to Date: 27 Gy
Reference Point Session Dosage Given: 3 Gy
Session Number: 9

## 2022-12-07 ENCOUNTER — Other Ambulatory Visit: Payer: Self-pay

## 2022-12-07 ENCOUNTER — Ambulatory Visit
Admission: RE | Admit: 2022-12-07 | Discharge: 2022-12-07 | Disposition: A | Discharge status: Home / Self Care | Payer: 59 | Source: Ambulatory Visit | Attending: Radiation Oncology | Admitting: Radiation Oncology

## 2022-12-07 DIAGNOSIS — Z510 Encounter for antineoplastic radiation therapy: Secondary | ICD-10-CM | POA: Diagnosis not present

## 2022-12-07 DIAGNOSIS — Z87891 Personal history of nicotine dependence: Secondary | ICD-10-CM | POA: Diagnosis not present

## 2022-12-07 DIAGNOSIS — C3412 Malignant neoplasm of upper lobe, left bronchus or lung: Secondary | ICD-10-CM | POA: Diagnosis not present

## 2022-12-07 LAB — RAD ONC ARIA SESSION SUMMARY
Course Elapsed Days: 13
Plan Fractions Treated to Date: 10
Plan Prescribed Dose Per Fraction: 3 Gy
Plan Total Fractions Prescribed: 10
Plan Total Prescribed Dose: 30 Gy
Reference Point Dosage Given to Date: 30 Gy
Reference Point Session Dosage Given: 3 Gy
Session Number: 10

## 2022-12-10 NOTE — Radiation Completion Notes (Addendum)
  Radiation Oncology         [phone] ________________________________  Name: Crystal Moore MRN: [id number]  Date of Service: 12/07/2022  DOB: 1945/12/11  End of Treatment Note     Diagnosis:   Stage IV, NSCLC, Squamous Cell Carcinoma of the LUL involving the right lung, vs. Stage III NSCLC of the LUL with synchronous (putative) Stage I NSCLC of the RUL   Intent: Palliative     ==========DELIVERED PLANS==========  First Treatment Date: 2022-11-24 - Last Treatment Date: 2022-12-07   Plan Name: Lung_L Site: Lung, Left Technique: 3D Mode: Photon Dose Per Fraction: 3 Gy Prescribed Dose (Delivered / Prescribed): 30 Gy / 30 Gy Prescribed Fxs (Delivered / Prescribed): 10 / 10     ==========ON TREATMENT VISIT DATES========== 2022-11-26, 2022-12-04     See weekly On Treatment Notes is Epic for details. The patient tolerated radiation. She developed fatigue and anticipated skin changes in the treatment field. She also developed blistering of her left arm which is the side on which she has deficits from prior strokes. These were not definitively felt to be related to her radiation therapy. She was given silvadene and dressings, and these were followed and anticipated to resolve.   The patient will receive a call in about one month from the radiation oncology department. She will continue follow up with Dr. Arbutus Ped as well.      Osker Mason, PAC

## 2022-12-17 ENCOUNTER — Telehealth: Payer: Self-pay | Admitting: *Deleted

## 2022-12-17 NOTE — Telephone Encounter (Signed)
Left a voicemail for the patient's daughter to see how she is feeling since completion of her radiation therapy.  Call back number left [phone]).  Lind Covert RN, BSN

## 2022-12-18 ENCOUNTER — Telehealth: Payer: Self-pay | Admitting: *Deleted

## 2022-12-18 NOTE — Telephone Encounter (Signed)
Spoke with the patient's daughter who states she is doing well since completion of her radiation treatments.  She reports the skin to her left forearm has healed from radiation changes.  She reports she has an area to her left back, close to the radiation field that has some skin changes and she has been using the silvadene cream.  No other concerns or questions voiced.  She was encouraged to give [place] a call with any other radiation concerns.  She verbalized understanding and was appreciative of the call.  Lind Covert RN, BSN

## 2022-12-23 DIAGNOSIS — J449 Chronic obstructive pulmonary disease, unspecified: Secondary | ICD-10-CM | POA: Diagnosis not present

## 2022-12-23 DIAGNOSIS — E1139 Type 2 diabetes mellitus with other diabetic ophthalmic complication: Secondary | ICD-10-CM | POA: Diagnosis not present

## 2022-12-23 DIAGNOSIS — I6350 Cerebral infarction due to unspecified occlusion or stenosis of unspecified cerebral artery: Secondary | ICD-10-CM | POA: Diagnosis not present

## 2022-12-23 DIAGNOSIS — R531 Weakness: Secondary | ICD-10-CM | POA: Diagnosis not present

## 2022-12-23 DIAGNOSIS — I69354 Hemiplegia and hemiparesis following cerebral infarction affecting left non-dominant side: Secondary | ICD-10-CM | POA: Diagnosis not present

## 2022-12-24 DIAGNOSIS — D500 Iron deficiency anemia secondary to blood loss (chronic): Secondary | ICD-10-CM | POA: Diagnosis not present

## 2022-12-24 DIAGNOSIS — E11621 Type 2 diabetes mellitus with foot ulcer: Secondary | ICD-10-CM | POA: Diagnosis not present

## 2022-12-24 DIAGNOSIS — I10 Essential (primary) hypertension: Secondary | ICD-10-CM | POA: Diagnosis not present

## 2022-12-24 DIAGNOSIS — J449 Chronic obstructive pulmonary disease, unspecified: Secondary | ICD-10-CM | POA: Diagnosis not present

## 2022-12-26 DIAGNOSIS — J449 Chronic obstructive pulmonary disease, unspecified: Secondary | ICD-10-CM | POA: Diagnosis not present

## 2022-12-26 DIAGNOSIS — R531 Weakness: Secondary | ICD-10-CM | POA: Diagnosis not present

## 2022-12-26 DIAGNOSIS — E1139 Type 2 diabetes mellitus with other diabetic ophthalmic complication: Secondary | ICD-10-CM | POA: Diagnosis not present

## 2022-12-26 DIAGNOSIS — I69354 Hemiplegia and hemiparesis following cerebral infarction affecting left non-dominant side: Secondary | ICD-10-CM | POA: Diagnosis not present

## 2022-12-26 DIAGNOSIS — J9601 Acute respiratory failure with hypoxia: Secondary | ICD-10-CM | POA: Diagnosis not present

## 2023-01-23 DIAGNOSIS — I69354 Hemiplegia and hemiparesis following cerebral infarction affecting left non-dominant side: Secondary | ICD-10-CM | POA: Diagnosis not present

## 2023-01-23 DIAGNOSIS — R531 Weakness: Secondary | ICD-10-CM | POA: Diagnosis not present

## 2023-01-23 DIAGNOSIS — I6350 Cerebral infarction due to unspecified occlusion or stenosis of unspecified cerebral artery: Secondary | ICD-10-CM | POA: Diagnosis not present

## 2023-01-23 DIAGNOSIS — E1139 Type 2 diabetes mellitus with other diabetic ophthalmic complication: Secondary | ICD-10-CM | POA: Diagnosis not present

## 2023-01-23 DIAGNOSIS — J449 Chronic obstructive pulmonary disease, unspecified: Secondary | ICD-10-CM | POA: Diagnosis not present

## 2023-01-27 DIAGNOSIS — J9601 Acute respiratory failure with hypoxia: Secondary | ICD-10-CM | POA: Diagnosis not present

## 2023-01-27 DIAGNOSIS — J449 Chronic obstructive pulmonary disease, unspecified: Secondary | ICD-10-CM | POA: Diagnosis not present

## 2023-01-27 DIAGNOSIS — E1139 Type 2 diabetes mellitus with other diabetic ophthalmic complication: Secondary | ICD-10-CM | POA: Diagnosis not present

## 2023-01-27 DIAGNOSIS — I69354 Hemiplegia and hemiparesis following cerebral infarction affecting left non-dominant side: Secondary | ICD-10-CM | POA: Diagnosis not present

## 2023-01-27 DIAGNOSIS — R531 Weakness: Secondary | ICD-10-CM | POA: Diagnosis not present

## 2023-02-08 ENCOUNTER — Ambulatory Visit
Admission: RE | Admit: 2023-02-08 | Discharge: 2023-02-08 | Disposition: A | Discharge status: Home / Self Care | Payer: 59 | Source: Ambulatory Visit | Attending: Radiation Oncology | Admitting: Radiation Oncology

## 2023-02-08 NOTE — Progress Notes (Signed)
  Radiation Oncology         [phone] ________________________________  Name: Crystal Moore MRN: [id number]  Date of Service: 02/08/2023  DOB: 1945-11-04  Post Treatment Telephone Note  Diagnosis:  Stage IV, NSCLC, Squamous Cell Carcinoma of the LUL involving the right lung, vs. Stage III NSCLC of the LUL with synchronous (putative) Stage I NSCLC of the RUL (as documented in provider EOT note)   The patient was available for call today.   Symptoms of fatigue have improved since completing therapy.  Symptoms of skin changes have improved since completing therapy.  Symptoms of esophagitis have improved since completing therapy.   The patient is NOT scheduled for follow up with her medical oncologist Dr. Arbutus Ped for ongoing care, and was encouraged to call if she develops concerns or questions regarding radiation.   This concludes the interaction.  Ruel Favors, LPN

## 2023-02-19 ENCOUNTER — Telehealth: Payer: Self-pay | Admitting: Acute Care

## 2023-02-19 NOTE — Telephone Encounter (Signed)
Crystal Moore would like office notes faxed to [facility]. Crystal Moore phone number is [phone]. Fax number is [phone].

## 2023-02-22 ENCOUNTER — Emergency Department (HOSPITAL_COMMUNITY): Payer: 59

## 2023-02-22 ENCOUNTER — Inpatient Hospital Stay (HOSPITAL_COMMUNITY)
Admission: EM | Admit: 2023-02-22 | Discharge: 2023-02-27 | DRG: 871 | Disposition: A | Discharge status: Home Health | Payer: 59 | Attending: Internal Medicine | Admitting: Internal Medicine

## 2023-02-22 DIAGNOSIS — R6520 Severe sepsis without septic shock: Secondary | ICD-10-CM | POA: Diagnosis not present

## 2023-02-22 DIAGNOSIS — I6350 Cerebral infarction due to unspecified occlusion or stenosis of unspecified cerebral artery: Secondary | ICD-10-CM | POA: Diagnosis not present

## 2023-02-22 DIAGNOSIS — E1159 Type 2 diabetes mellitus with other circulatory complications: Secondary | ICD-10-CM

## 2023-02-22 DIAGNOSIS — E8809 Other disorders of plasma-protein metabolism, not elsewhere classified: Secondary | ICD-10-CM | POA: Diagnosis present

## 2023-02-22 DIAGNOSIS — Z923 Personal history of irradiation: Secondary | ICD-10-CM

## 2023-02-22 DIAGNOSIS — R404 Transient alteration of awareness: Secondary | ICD-10-CM | POA: Diagnosis not present

## 2023-02-22 DIAGNOSIS — D649 Anemia, unspecified: Secondary | ICD-10-CM | POA: Diagnosis present

## 2023-02-22 DIAGNOSIS — Z79899 Other long term (current) drug therapy: Secondary | ICD-10-CM

## 2023-02-22 DIAGNOSIS — I10 Essential (primary) hypertension: Secondary | ICD-10-CM | POA: Diagnosis present

## 2023-02-22 DIAGNOSIS — G40109 Localization-related (focal) (partial) symptomatic epilepsy and epileptic syndromes with simple partial seizures, not intractable, without status epilepticus: Secondary | ICD-10-CM | POA: Diagnosis present

## 2023-02-22 DIAGNOSIS — F0390 Unspecified dementia without behavioral disturbance: Secondary | ICD-10-CM | POA: Diagnosis present

## 2023-02-22 DIAGNOSIS — M25572 Pain in left ankle and joints of left foot: Secondary | ICD-10-CM | POA: Diagnosis not present

## 2023-02-22 DIAGNOSIS — Z7984 Long term (current) use of oral hypoglycemic drugs: Secondary | ICD-10-CM

## 2023-02-22 DIAGNOSIS — Z87891 Personal history of nicotine dependence: Secondary | ICD-10-CM | POA: Diagnosis not present

## 2023-02-22 DIAGNOSIS — K219 Gastro-esophageal reflux disease without esophagitis: Secondary | ICD-10-CM | POA: Diagnosis present

## 2023-02-22 DIAGNOSIS — R918 Other nonspecific abnormal finding of lung field: Secondary | ICD-10-CM | POA: Diagnosis not present

## 2023-02-22 DIAGNOSIS — I672 Cerebral atherosclerosis: Secondary | ICD-10-CM | POA: Diagnosis not present

## 2023-02-22 DIAGNOSIS — J398 Other specified diseases of upper respiratory tract: Secondary | ICD-10-CM | POA: Diagnosis present

## 2023-02-22 DIAGNOSIS — J984 Other disorders of lung: Secondary | ICD-10-CM | POA: Diagnosis not present

## 2023-02-22 DIAGNOSIS — J439 Emphysema, unspecified: Secondary | ICD-10-CM | POA: Diagnosis not present

## 2023-02-22 DIAGNOSIS — K31819 Angiodysplasia of stomach and duodenum without bleeding: Secondary | ICD-10-CM | POA: Diagnosis present

## 2023-02-22 DIAGNOSIS — R0602 Shortness of breath: Secondary | ICD-10-CM | POA: Diagnosis not present

## 2023-02-22 DIAGNOSIS — I69354 Hemiplegia and hemiparesis following cerebral infarction affecting left non-dominant side: Secondary | ICD-10-CM

## 2023-02-22 DIAGNOSIS — R6889 Other general symptoms and signs: Secondary | ICD-10-CM | POA: Diagnosis not present

## 2023-02-22 DIAGNOSIS — L89899 Pressure ulcer of other site, unspecified stage: Secondary | ICD-10-CM

## 2023-02-22 DIAGNOSIS — Z9981 Dependence on supplemental oxygen: Secondary | ICD-10-CM

## 2023-02-22 DIAGNOSIS — L89523 Pressure ulcer of left ankle, stage 3: Secondary | ICD-10-CM | POA: Diagnosis present

## 2023-02-22 DIAGNOSIS — N390 Urinary tract infection, site not specified: Secondary | ICD-10-CM | POA: Diagnosis present

## 2023-02-22 DIAGNOSIS — E1151 Type 2 diabetes mellitus with diabetic peripheral angiopathy without gangrene: Secondary | ICD-10-CM | POA: Diagnosis present

## 2023-02-22 DIAGNOSIS — G9341 Metabolic encephalopathy: Secondary | ICD-10-CM | POA: Diagnosis present

## 2023-02-22 DIAGNOSIS — A419 Sepsis, unspecified organism: Secondary | ICD-10-CM | POA: Diagnosis present

## 2023-02-22 DIAGNOSIS — J189 Pneumonia, unspecified organism: Secondary | ICD-10-CM

## 2023-02-22 DIAGNOSIS — E1165 Type 2 diabetes mellitus with hyperglycemia: Secondary | ICD-10-CM

## 2023-02-22 DIAGNOSIS — S91002A Unspecified open wound, left ankle, initial encounter: Secondary | ICD-10-CM | POA: Diagnosis not present

## 2023-02-22 DIAGNOSIS — J849 Interstitial pulmonary disease, unspecified: Secondary | ICD-10-CM

## 2023-02-22 DIAGNOSIS — Z7401 Bed confinement status: Secondary | ICD-10-CM

## 2023-02-22 DIAGNOSIS — L89893 Pressure ulcer of other site, stage 3: Secondary | ICD-10-CM | POA: Diagnosis present

## 2023-02-22 DIAGNOSIS — E119 Type 2 diabetes mellitus without complications: Secondary | ICD-10-CM

## 2023-02-22 DIAGNOSIS — Z8673 Personal history of transient ischemic attack (TIA), and cerebral infarction without residual deficits: Secondary | ICD-10-CM

## 2023-02-22 DIAGNOSIS — Z886 Allergy status to analgesic agent status: Secondary | ICD-10-CM

## 2023-02-22 DIAGNOSIS — Z833 Family history of diabetes mellitus: Secondary | ICD-10-CM

## 2023-02-22 DIAGNOSIS — E1139 Type 2 diabetes mellitus with other diabetic ophthalmic complication: Secondary | ICD-10-CM | POA: Diagnosis not present

## 2023-02-22 DIAGNOSIS — J9611 Chronic respiratory failure with hypoxia: Secondary | ICD-10-CM | POA: Diagnosis present

## 2023-02-22 DIAGNOSIS — G9389 Other specified disorders of brain: Secondary | ICD-10-CM | POA: Diagnosis not present

## 2023-02-22 DIAGNOSIS — Z794 Long term (current) use of insulin: Secondary | ICD-10-CM

## 2023-02-22 DIAGNOSIS — J9601 Acute respiratory failure with hypoxia: Secondary | ICD-10-CM | POA: Diagnosis not present

## 2023-02-22 DIAGNOSIS — I700 Atherosclerosis of aorta: Secondary | ICD-10-CM | POA: Diagnosis not present

## 2023-02-22 DIAGNOSIS — J432 Centrilobular emphysema: Secondary | ICD-10-CM | POA: Diagnosis not present

## 2023-02-22 DIAGNOSIS — R531 Weakness: Secondary | ICD-10-CM | POA: Diagnosis not present

## 2023-02-22 DIAGNOSIS — R609 Edema, unspecified: Secondary | ICD-10-CM | POA: Diagnosis not present

## 2023-02-22 DIAGNOSIS — Z85118 Personal history of other malignant neoplasm of bronchus and lung: Secondary | ICD-10-CM

## 2023-02-22 DIAGNOSIS — R4182 Altered mental status, unspecified: Secondary | ICD-10-CM

## 2023-02-22 DIAGNOSIS — Z7902 Long term (current) use of antithrombotics/antiplatelets: Secondary | ICD-10-CM

## 2023-02-22 DIAGNOSIS — Z743 Need for continuous supervision: Secondary | ICD-10-CM | POA: Diagnosis not present

## 2023-02-22 DIAGNOSIS — L89612 Pressure ulcer of right heel, stage 2: Secondary | ICD-10-CM | POA: Diagnosis present

## 2023-02-22 DIAGNOSIS — J479 Bronchiectasis, uncomplicated: Secondary | ICD-10-CM | POA: Diagnosis not present

## 2023-02-22 DIAGNOSIS — Z8616 Personal history of COVID-19: Secondary | ICD-10-CM | POA: Diagnosis not present

## 2023-02-22 DIAGNOSIS — Z993 Dependence on wheelchair: Secondary | ICD-10-CM

## 2023-02-22 DIAGNOSIS — J690 Pneumonitis due to inhalation of food and vomit: Secondary | ICD-10-CM | POA: Diagnosis present

## 2023-02-22 DIAGNOSIS — J449 Chronic obstructive pulmonary disease, unspecified: Secondary | ICD-10-CM | POA: Diagnosis present

## 2023-02-22 LAB — COMPREHENSIVE METABOLIC PANEL
ALT: 18 U/L (ref 0–44)
AST: 15 U/L (ref 15–41)
Albumin: 2.6 g/dL — ABNORMAL LOW (ref 3.5–5.0)
Alkaline Phosphatase: 74 U/L (ref 38–126)
Anion gap: 9 (ref 5–15)
BUN: 24 mg/dL — ABNORMAL HIGH (ref 8–23)
CO2: 23 mmol/L (ref 22–32)
Calcium: 8.7 mg/dL — ABNORMAL LOW (ref 8.9–10.3)
Chloride: 107 mmol/L (ref 98–111)
Creatinine, Ser: 0.95 mg/dL (ref 0.44–1.00)
GFR, Estimated: >60 mL/min (ref >60)
Glucose, Bld: 158 mg/dL — ABNORMAL HIGH (ref 70–99)
Potassium: 3.9 mmol/L (ref 3.5–5.1)
Sodium: 139 mmol/L (ref 135–145)
Total Bilirubin: 0.4 mg/dL (ref 0.3–1.2)
Total Protein: 7.7 g/dL (ref 6.5–8.1)

## 2023-02-22 LAB — CBC WITH DIFFERENTIAL/PLATELET
Abs Immature Granulocytes: 0.04 10*3/uL (ref 0.00–0.07)
Basophils Absolute: 0 10*3/uL (ref 0.0–0.1)
Basophils Relative: 0 %
Eosinophils Absolute: 0 10*3/uL (ref 0.0–0.5)
Eosinophils Relative: 0 %
HCT: 28.7 % — ABNORMAL LOW (ref 36.0–46.0)
Hemoglobin: 8.8 g/dL — ABNORMAL LOW (ref 12.0–15.0)
Immature Granulocytes: 0 %
Lymphocytes Relative: 14 %
Lymphs Abs: 1.8 10*3/uL (ref 0.7–4.0)
MCH: 25.1 pg — ABNORMAL LOW (ref 26.0–34.0)
MCHC: 30.7 g/dL (ref 30.0–36.0)
MCV: 82 fL (ref 80.0–100.0)
Monocytes Absolute: 0.9 10*3/uL (ref 0.1–1.0)
Monocytes Relative: 7 %
Neutro Abs: 9.9 10*3/uL — ABNORMAL HIGH (ref 1.7–7.7)
Neutrophils Relative %: 79 %
Platelets: 283 10*3/uL (ref 150–400)
RBC: 3.5 MIL/uL — ABNORMAL LOW (ref 3.87–5.11)
RDW: 19 % — ABNORMAL HIGH (ref 11.5–15.5)
WBC: 12.7 10*3/uL — ABNORMAL HIGH (ref 4.0–10.5)
nRBC: 0 % (ref 0.0–0.2)

## 2023-02-22 LAB — CBG MONITORING, ED: Glucose-Capillary: 160 mg/dL — ABNORMAL HIGH (ref 70–99)

## 2023-02-22 LAB — AMMONIA: Ammonia: 18 umol/L (ref 9–35)

## 2023-02-22 LAB — POC OCCULT BLOOD, ED: Fecal Occult Bld: NEGATIVE

## 2023-02-22 LAB — SARS CORONAVIRUS 2 BY RT PCR: SARS Coronavirus 2 by RT PCR: NEGATIVE

## 2023-02-22 LAB — LACTIC ACID, PLASMA: Lactic Acid, Venous: 1.8 mmol/L (ref 0.5–1.9)

## 2023-02-22 MED ORDER — ENSURE ENLIVE PO LIQD
237.0000 mL | Freq: Two times a day (BID) | ORAL | Status: DC
  Administered 2023-02-24 – 2023-02-27 (×8): 237 mL via ORAL

## 2023-02-22 MED ORDER — CLOPIDOGREL BISULFATE 75 MG PO TABS
75.0000 mg | ORAL_TABLET | Freq: Every day | ORAL | Status: DC
  Administered 2023-02-23 – 2023-02-27 (×5): 75 mg via ORAL
  Filled 2023-02-22 (×5): qty 1

## 2023-02-22 MED ORDER — LACTATED RINGERS IV BOLUS (SEPSIS)
1000.0000 mL | Freq: Once | INTRAVENOUS | Status: AC
  Administered 2023-02-22: 1000 mL via INTRAVENOUS

## 2023-02-22 MED ORDER — FOLIC ACID 1 MG PO TABS
1.0000 mg | ORAL_TABLET | Freq: Every day | ORAL | Status: DC
  Administered 2023-02-24 – 2023-02-27 (×4): 1 mg via ORAL
  Filled 2023-02-22 (×5): qty 1

## 2023-02-22 MED ORDER — LACTATED RINGERS IV SOLN: INTRAVENOUS | Status: DC

## 2023-02-22 MED ORDER — SODIUM CHLORIDE 0.9 % IV BOLUS
1000.0000 mL | Freq: Once | INTRAVENOUS | Status: AC
  Administered 2023-02-22: 1000 mL via INTRAVENOUS

## 2023-02-22 MED ORDER — IOHEXOL 350 MG/ML SOLN
75.0000 mL | Freq: Once | INTRAVENOUS | Status: AC | PRN
  Administered 2023-02-22: 75 mL via INTRAVENOUS

## 2023-02-22 MED ORDER — SODIUM CHLORIDE 0.9 % IV SOLN: INTRAVENOUS | Status: DC

## 2023-02-22 MED ORDER — SODIUM CHLORIDE 0.9 % IV SOLN
500.0000 mg | INTRAVENOUS | Status: DC
  Administered 2023-02-22: 500 mg via INTRAVENOUS
  Filled 2023-02-22: qty 5

## 2023-02-22 MED ORDER — SODIUM CHLORIDE 0.9 % IV SOLN
2.0000 g | INTRAVENOUS | Status: DC
  Administered 2023-02-22: 2 g via INTRAVENOUS
  Filled 2023-02-22: qty 20

## 2023-02-22 MED ORDER — GABAPENTIN 300 MG PO CAPS
300.0000 mg | ORAL_CAPSULE | Freq: Three times a day (TID) | ORAL | Status: DC
  Administered 2023-02-23 – 2023-02-27 (×12): 300 mg via ORAL
  Filled 2023-02-22 (×12): qty 1

## 2023-02-22 MED ORDER — SODIUM CHLORIDE 0.9 % IV SOLN
3.0000 g | Freq: Four times a day (QID) | INTRAVENOUS | Status: DC
  Administered 2023-02-23 – 2023-02-26 (×14): 3 g via INTRAVENOUS
  Filled 2023-02-22 (×14): qty 8

## 2023-02-22 MED ORDER — INSULIN ASPART 100 UNIT/ML IJ SOLN
0.0000 [IU] | Freq: Three times a day (TID) | INTRAMUSCULAR | Status: DC
  Administered 2023-02-23: 2 [IU] via SUBCUTANEOUS
  Administered 2023-02-24 (×2): 1 [IU] via SUBCUTANEOUS
  Administered 2023-02-24: 2 [IU] via SUBCUTANEOUS
  Administered 2023-02-25: 3 [IU] via SUBCUTANEOUS
  Administered 2023-02-25: 1 [IU] via SUBCUTANEOUS
  Administered 2023-02-25: 2 [IU] via SUBCUTANEOUS
  Administered 2023-02-26: 3 [IU] via SUBCUTANEOUS
  Administered 2023-02-26: 2 [IU] via SUBCUTANEOUS
  Administered 2023-02-26 – 2023-02-27 (×2): 1 [IU] via SUBCUTANEOUS
  Administered 2023-02-27: 2 [IU] via SUBCUTANEOUS

## 2023-02-22 MED ORDER — AMLODIPINE BESYLATE 5 MG PO TABS
5.0000 mg | ORAL_TABLET | Freq: Every day | ORAL | Status: DC
  Filled 2023-02-22 (×2): qty 1

## 2023-02-22 MED ORDER — PANTOPRAZOLE SODIUM 40 MG PO TBEC
40.0000 mg | DELAYED_RELEASE_TABLET | Freq: Two times a day (BID) | ORAL | Status: DC
  Administered 2023-02-23 – 2023-02-27 (×8): 40 mg via ORAL
  Filled 2023-02-22 (×8): qty 1

## 2023-02-22 MED ORDER — ATORVASTATIN CALCIUM 80 MG PO TABS
80.0000 mg | ORAL_TABLET | Freq: Every evening | ORAL | Status: DC
  Administered 2023-02-23 – 2023-02-26 (×4): 80 mg via ORAL
  Filled 2023-02-22 (×4): qty 1

## 2023-02-22 NOTE — Sepsis Progress Note (Signed)
Following for sepsis monitoring ?

## 2023-02-22 NOTE — Assessment & Plan Note (Signed)
-  stage II wound of the left lateral malleolus and plantar surface of MTP joint of right foot -does not affect infected -wound consult

## 2023-02-22 NOTE — Progress Notes (Signed)
Pharmacy Antibiotic Note  Crystal Moore is a 77 y.o. female admitted on 02/22/2023 with aspiration PNA.  Pharmacy has been consulted for Unasyn dosing.  CrCl 40-50 mL/min  Plan: Unasyn 3g Q6H Pharmacy will sign off and monitor peripherally for dose adjustments  Height: 5\' 2"  (157.5 cm) Weight: 64 kg (141 lb 1.5 oz) IBW/kg (Calculated) : 50.1  Temp (24hrs), Avg:99.3 F (37.4 C), Min:98.8 F (37.1 C), Max:100.1 F (37.8 C)  Recent Labs  Lab 02/22/23 1800 02/22/23 2231  WBC 12.7*  --   CREATININE 0.95  --   LATICACIDVEN  --  1.8    Estimated Creatinine Clearance: 44.3 mL/min (by C-G formula based on SCr of 0.95 mg/dL).    Allergies  Allergen Reactions   Aspirin     Can not take due to previous GI bleed - per daughter    Thank you for allowing pharmacy to be a part of this patient's care.  Eldridge Scot, PharmD Clinical Pharmacist 02/22/2023, 11:19 PM

## 2023-02-22 NOTE — Assessment & Plan Note (Signed)
-   With residual left-sided deficit.  Continue Plavix

## 2023-02-22 NOTE — Assessment & Plan Note (Signed)
-   Secondary to sepsis from aspiration pneumonia in the setting of dementia -Patient only oriented to self at baseline -Continue to monitor clinically with IV antibiotics

## 2023-02-22 NOTE — Assessment & Plan Note (Signed)
-  Questionable debris versus tracheal mass seen on CTA chest.  Discussed findings with daughter and she would like to readdress this in the morning as she is not certain that patient can stay still for the scan tonight. -PET-SCAN in March unfortunately had significant artifact in the neck

## 2023-02-22 NOTE — Assessment & Plan Note (Signed)
Chronic hypoxemic respiratory failure -Admitted for aspiration pneumonia but remains on baseline home 2 L

## 2023-02-22 NOTE — Assessment & Plan Note (Addendum)
-  secondary to likely aspiration pneumonia with CT chest demonstrating potential debris vs mass in the trachea along with hazy pulmonary density to upper and lower lobes.  Presented with elevated temperature of 100.1, heart rate of 102 and leukocytosis. -Started on IV Rocephin and azithromycin in the ED.  Will switch to IV Unasyn. -Patient receiving sepsis fluid bolus -Keep on continuous fluid overnight

## 2023-02-22 NOTE — ED Notes (Signed)
Attempted 2 IV sticks, unsuccessful. Family at bedside states IV team usually consulted for [place] line. Ordering IV team

## 2023-02-22 NOTE — Assessment & Plan Note (Signed)
-   Continue amlodipine ?

## 2023-02-22 NOTE — ED Triage Notes (Signed)
Pt BIB EMS from home. Family said since Saturday has seemed more altered, nonverbal most of the time which is not baseline. Has hx of stroke with left sided deficits, lung cancer treatment completed recently. Alert to self. On 2L at baseline.

## 2023-02-22 NOTE — Assessment & Plan Note (Signed)
-   Baseline alert oriented only to self.  Mostly bed bound.

## 2023-02-22 NOTE — ED Notes (Signed)
IV team unable to get blood return for labs and cultures. Consulted phlebotomy for attempted straight stick when patient returns from CT

## 2023-02-22 NOTE — H&P (Signed)
History and Physical    Patient: Crystal Moore NWG:[id number] DOB: [birth year] DOA: 02/22/2023 DOS: the patient was seen and examined on 02/22/2023 PCP: Lorenda Ishihara, MD  Patient coming from: Home  Chief Complaint:  Chief Complaint  Patient presents with   Altered Mental Status   HPI: Crystal Moore is a 77 y.o. female with medical history significant of Stage IV non-small cell lung cancer on palliative radiation, CVA with left-sided residual deficit, interstitial lung disease with chronic hypoxemia on 2 L, type 2 diabetes, PVD, gastric AVMs, COPD who presents with altered mental status.  Daughter at bedside provides hx as pt has dementia and is oriented only to self.  Aid at home told daughter that pt was not eating today, having tremors. Over the weekend has been more non-verbal. Her oxygen concentrator also ran out. Pt also sometimes get tired of wearing her oxygen and takes it off. She is on 2L O2 at baseline. Pt mostly bed bound or sit in recliner chair throughout the day.  New cough yesterday. No fever. No vomiting or diarrhea. Had wounds on both feet that recently came back after healing up with wound care for the past year.   Pt had last palliative radiation in May and daughter feels her symptoms of cough and sputum production has improved significantly.   In the ED, she had temperature 100.1 F, heart rate of 102, normal blood pressure and on her baseline 2 L.  Mild leukocytosis of 12.7, hemoglobin 8.8 which is down from baseline of 9-10.  Hemoccult is negative however.  No significant electrolyte abnormalities.  CT head negative  CTA chest was negative for acute pulmonary embolism and redemonstrated known multiple bilateral spiculated pulmonary masses.  There is interval mild hazy pulmonary density in the left upper and bilateral lower lobe suggesting superimposed infection or inflammatory process.  There is also a filling defect within the trachea that could represent  debris versus possible tracheal mass.  She was given sepsis protocol fluid and started on broad-spectrum IV antibiotics for community-acquired pneumonia.  Hospitalist consulted for admission.  Review of Systems: unable to review all systems due to the inability of the patient to answer questions. Past Medical History:  Diagnosis Date   Abnormal gait    wheelchair bound   Anemia 06/24/2022   IDA   Arthritis    COPD (chronic obstructive pulmonary disease) (HCC)    COVID-19 virus infection 07/2020   CVA (cerebral vascular accident) (HCC) 09/22/2022   with residual left-sided weakness   Dementia (HCC)    Depression    no current problem   Diabetes mellitus without complication (HCC)    GERD (gastroesophageal reflux disease)    Headache    Hemiparesis affecting left side as late effect of cerebrovascular accident (CVA) (HCC)    w/c bound   HOH (hard of hearing) 01/13/2018   no hearing aids   Hypertension    Mass of lung    bilateral masses   PAD (peripheral artery disease) (HCC)    in CE   Pneumonia    x 4   Pyelonephritis    Seizures (HCC) 09/25/2022   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   Sensorineural hearing loss    bilateral in CE, no hearing aids   Stroke [facility])    Tobacco use disorder 2019   quit 2019   UTI (urinary tract infection)    Wheelchair dependent    Past Surgical History:  Procedure Laterality Date   ABDOMINAL AORTOGRAM  W/LOWER EXTREMITY N/A 08/30/2020   Procedure: ABDOMINAL AORTOGRAM W/LOWER EXTREMITY;  Surgeon: Leonie Douglas, MD;  Location: [facility];  Service: Cardiovascular;  Laterality: N/A;   ABDOMINAL AORTOGRAM W/LOWER EXTREMITY N/A 09/04/2020   Procedure: ABDOMINAL AORTOGRAM W/LOWER EXTREMITY;  Surgeon: Leonie Douglas, MD;  Location: [facility];  Service: Cardiovascular;  Laterality: N/A;   ESOPHAGOGASTRODUODENOSCOPY (EGD) WITH PROPOFOL N/A 09/16/2020   Procedure: ESOPHAGOGASTRODUODENOSCOPY (EGD) WITH PROPOFOL;   Surgeon: Shellia Cleverly, DO;  Location: [facility] ENDOSCOPY;  Service: Gastroenterology;  Laterality: N/A;   FINE NEEDLE ASPIRATION  10/27/2022   Procedure: FINE NEEDLE ASPIRATION (FNA) LINEAR;  Surgeon: Josephine Igo, DO;  Location: [facility] ENDOSCOPY;  Service: Pulmonary;;   HEMOSTASIS CLIP PLACEMENT  09/16/2020   Procedure: HEMOSTASIS CLIP PLACEMENT;  Surgeon: Shellia Cleverly, DO;  Location: [facility] ENDOS[facility];  Service: Gastroenterology;;   HOT HEMOSTASIS N/A 09/16/2020   Procedure: HOT HEMOSTASIS (ARGON PLASMA COAGULATION/BICAP);  Surgeon: Shellia Cleverly, DO;  Location: [facility] ENDOSCOPY;  Service: Gastroenterology;  Laterality: N/A;   PERIPHERAL VASCULAR INTERVENTION Right 08/30/2020   Procedure: PERIPHERAL VASCULAR INTERVENTION;  Surgeon: Leonie Douglas, MD;  Location: [facility];  Service: Cardiovascular;  Laterality: Right;  femoral popliteal   PERIPHERAL VASCULAR INTERVENTION Left 09/04/2020   Procedure: PERIPHERAL VASCULAR INTERVENTION;  Surgeon: Leonie Douglas, MD;  Location: [facility];  Service: Cardiovascular;  Laterality: Left;  SFA   TUBAL LIGATION     VIDEO BRONCHOSCOPY WITH ENDOBRONCHIAL ULTRASOUND Bilateral 10/27/2022   Procedure: VIDEO BRONCHOSCOPY WITH ENDOBRONCHIAL ULTRASOUND;  Surgeon: Josephine Igo, DO;  Location: [facility] ENDOSCOPY;  Service: Pulmonary;  Laterality: Bilateral;   Social History:  reports that she quit smoking about 5 years ago. Her smoking use included cigarettes. She started smoking about 45 years ago. She has a 40 pack-year smoking history. She has never used smokeless tobacco. She reports that she does not drink alcohol and does not use drugs.  Allergies  Allergen Reactions   Aspirin     Can not take due to previous GI bleed - per daughter    Family History  Problem Relation Age of Onset   Diabetes Mother    Diabetes Sister    Diabetes Brother     Prior to Admission medications   Medication Sig Start Date End Date Taking? Authorizing Provider   ACCU-CHEK GUIDE test strip USE TO test AS DIRECTED TWICE DAILY 05/23/21   Romero Belling, MD  Accu-Chek Softclix Lancets lancets SMARTSIG:Topical 1-2 Times Daily 08/26/22   [provider]  acetaminophen (TYLENOL) 500 MG tablet Take 1,000 mg by mouth every 6 (six) hours as needed (pain).    [provider]  albuterol (PROVENTIL) (2.5 MG/3ML) 0.083% nebulizer solution Inhale 3 mLs (2.5 mg total) into the lungs every 4 (four) hours as needed for wheezing or shortness of breath. 01/17/19   Marlin Canary U, DO  albuterol (VENTOLIN HFA) 108 (90 Base) MCG/ACT inhaler Inhale 1 puff into the lungs every 6 (six) hours as needed for wheezing or shortness of breath.    [provider]  amLODipine (NORVASC) 5 MG tablet Take 5 mg by mouth in the morning. 05/30/21   [provider]  atorvastatin (LIPITOR) 80 MG tablet Take 1 tablet (80 mg total) by mouth daily at 6 PM. Patient taking differently: Take 80 mg by mouth every evening. 12/21/18   Edsel Petrin, DO  clopidogrel (PLAVIX) 75 MG tablet Take 1 tablet (75 mg total) by mouth daily. 09/24/20  Leonie Douglas, MD  feeding supplement (ENSURE ENLIVE / ENSURE PLUS) LIQD Take 237 mLs by mouth 2 (two) times daily between meals. 06/28/20   Romero Belling, MD  ferrous sulfate 325 (65 FE) MG tablet Take 1 tablet (325 mg total) by mouth daily with breakfast. 09/26/22   Osvaldo Shipper, MD  folic acid (FOLVITE) 1 MG tablet Take 1 tablet (1 mg total) by mouth daily. 09/26/22   Osvaldo Shipper, MD  gabapentin (NEURONTIN) 300 MG capsule Take 1 capsule (300 mg total) by mouth 3 (three) times daily. 09/06/20   Kathlen Mody, MD  insulin glargine (LANTUS) 100 UNIT/ML injection Inject 0.15 mLs (15 Units total) into the skin daily. Patient taking differently: Inject 25 Units into the skin daily. 09/18/20   Lorin Glass, MD  linagliptin (TRADJENTA) 5 MG TABS tablet Take 5 mg by mouth daily.    [provider]  pantoprazole (PROTONIX) 40 MG  tablet Take 40 mg by mouth 2 (two) times daily.    [provider]    Physical Exam: Vitals:   02/22/23 1848 02/22/23 1946 02/22/23 2030 02/22/23 2145  BP:  (!) 127/49 (!) 98/55 (!) 150/52  Pulse:  91 93 83  Resp:  16 13 14   Temp: 99 F (37.2 C) 100.1 F (37.8 C)    TempSrc: Rectal Rectal    SpO2:  96% 99% 96%  Weight:      Height:       Constitutional: NAD, calm, comfortable, chronically ill cachetic elderly female laying in bed. Awoke easily to voice.  Eyes: lids and conjunctivae normal ENMT: Mucous membranes are moist.  Neck: normal, supple, no obvious mass  Respiratory: clear to auscultation bilaterally, no wheezing, no crackles. Normal respiratory effort. No accessory muscle use. On 2L via [place].  Cardiovascular: Regular rate and rhythm, no murmurs / rubs / gallops. No extremity edema.  Abdomen: no tenderness Musculoskeletal: no clubbing / cyanosis. No joint deformity upper and lower extremities.Both legs held in flexion and grimacing with pain when attempted to passively extend. Wasting on all extremity.  Skin: Erythematous subcutaneous wound to left lateral malleolus, right MTP plantar surface  Neurologic: CN 2-12 grossly intact. Alert and oriented only to self. Not able to follow commands.  Psychiatric:  Alert and oriented only to self. Normal mood.   Data Reviewed:  See HPI  Assessment and Plan: * Sepsis (HCC) -secondary to likely aspiration pneumonia with CT chest demonstrating potential debris vs mass in the trachea along with hazy pulmonary density to upper and lower lobes.  Presented with elevated temperature of 100.1, heart rate of 102 and leukocytosis. -Started on IV Rocephin and azithromycin in the ED.  Will switch to IV Unasyn. -Patient receiving sepsis fluid bolus -Keep on continuous fluid overnight  Tracheal mass -Questionable debris versus tracheal mass seen on CTA chest.  Discussed findings with daughter and she would like to readdress this in the  morning as she is not certain that patient can stay still for the scan tonight. -PET-SCAN in March unfortunately had significant artifact in the neck    History of CVA (cerebrovascular accident) - With residual left-sided deficit.  Continue Plavix  Acute metabolic encephalopathy - Secondary to sepsis from aspiration pneumonia in the setting of dementia -Patient only oriented to self at baseline -Continue to monitor clinically with IV antibiotics  Dementia without behavioral disturbance (HCC) - Baseline alert oriented only to self.  Mostly bed bound.  Pressure injury of skin of both feet -stage II wound of the  left lateral malleolus and plantar surface of MTP joint of right foot -does not affect infected -wound consult  Hypertension - Continue amlodipine  Diabetes (HCC) - Takes 25 units of Lantus daily -Hemoglobin A1c was 7.4 in February -Keep on sliding scale insulin for now  ILD (interstitial lung disease) (HCC) Chronic hypoxemic respiratory failure -Admitted for aspiration pneumonia but remains on baseline home 2 L  COPD (chronic obstructive pulmonary disease) (HCC) - Stable, does not appear to be in acute exacerbation      Advance Care Planning: Full  Consults: none  Family Communication: daughter over the phone  Severity of Illness: The appropriate patient status for this patient is OBSERVATION. Observation status is judged to be reasonable and necessary in order to provide the required intensity of service to ensure the patient's safety. The patient's presenting symptoms, physical exam findings, and initial radiographic and laboratory data in the context of their medical condition is felt to place them at decreased risk for further clinical deterioration. Furthermore, it is anticipated that the patient will be medically stable for discharge from the hospital within 2 midnights of admission.   Author: Anselm Jungling, DO 02/22/2023 11:36 PM  For on call review  [email or web address].

## 2023-02-22 NOTE — Assessment & Plan Note (Signed)
-   Stable, does not appear to be in acute exacerbation

## 2023-02-22 NOTE — Assessment & Plan Note (Addendum)
-   Takes 25 units of Lantus daily -Hemoglobin A1c was 7.4 in February -Keep on sliding scale insulin for now

## 2023-02-22 NOTE — ED Provider Notes (Signed)
[facility]ER[facility]ENC[facility] DEPARTMENT AT [facility] Provider Note   CSN: [id number] Arrival date & time: 02/22/23  1424     History Chief Complaint  Patient presents with   Altered Mental Status    Crystal Moore is a 77 y.o. female.  Patient with past history significant for COPD, interstitial lung disease, lung cancer, diabetes, acute CVA, and dementia who presents to the emergency department with concerns of altered mental status.  Patient's daughter reports that she was last known normal on Saturday approximately 2 days ago.  Since then, patient become more nonverbal which is not typical for her.  Patient has a history of a stroke with chronic left-sided deficits.  Recently completed radiation treatment for lung cancer approximately 1 month ago.  Patient is alert to self and place time.  Patient is on Plavix.  No recent falls or head strikes.   Altered Mental Status      Home Medications Prior to Admission medications   Medication Sig Start Date End Date Taking? Authorizing Provider  ACCU-CHEK GUIDE test strip USE TO test AS DIRECTED TWICE DAILY 05/23/21   Romero Belling, MD  Accu-Chek Softclix Lancets lancets SMARTSIG:Topical 1-2 Times Daily 08/26/22   [provider]  acetaminophen (TYLENOL) 500 MG tablet Take 1,000 mg by mouth every 6 (six) hours as needed (pain).    [provider]  albuterol (PROVENTIL) (2.5 MG/3ML) 0.083% nebulizer solution Inhale 3 mLs (2.5 mg total) into the lungs every 4 (four) hours as needed for wheezing or shortness of breath. 01/17/19   Marlin Canary U, DO  albuterol (VENTOLIN HFA) 108 (90 Base) MCG/ACT inhaler Inhale 1 puff into the lungs every 6 (six) hours as needed for wheezing or shortness of breath.    [provider]  amLODipine (NORVASC) 5 MG tablet Take 5 mg by mouth in the morning. 05/30/21   [provider]  atorvastatin (LIPITOR) 80 MG tablet Take 1 tablet (80 mg total) by mouth daily at 6 PM. Patient  taking differently: Take 80 mg by mouth every evening. 12/21/18   Edsel Petrin, DO  clopidogrel (PLAVIX) 75 MG tablet Take 1 tablet (75 mg total) by mouth daily. 09/24/20   Leonie Douglas, MD  feeding supplement (ENSURE ENLIVE / ENSURE PLUS) LIQD Take 237 mLs by mouth 2 (two) times daily between meals. 06/28/20   Romero Belling, MD  ferrous sulfate 325 (65 FE) MG tablet Take 1 tablet (325 mg total) by mouth daily with breakfast. 09/26/22   Osvaldo Shipper, MD  folic acid (FOLVITE) 1 MG tablet Take 1 tablet (1 mg total) by mouth daily. 09/26/22   Osvaldo Shipper, MD  gabapentin (NEURONTIN) 300 MG capsule Take 1 capsule (300 mg total) by mouth 3 (three) times daily. 09/06/20   Kathlen Mody, MD  insulin glargine (LANTUS) 100 UNIT/ML injection Inject 0.15 mLs (15 Units total) into the skin daily. Patient taking differently: Inject 25 Units into the skin daily. 09/18/20   Lorin Glass, MD  linagliptin (TRADJENTA) 5 MG TABS tablet Take 5 mg by mouth daily.    [provider]  pantoprazole (PROTONIX) 40 MG tablet Take 40 mg by mouth 2 (two) times daily.    [provider]      Allergies    Aspirin    Review of Systems   Review of Systems  Constitutional:  Positive for activity change.  All other systems reviewed and are negative.   Physical Exam Updated Vital Signs BP (!) 127/49  Pulse 91   Temp 100.1 F (37.8 C) (Rectal)   Resp 16   Ht 5\' 2"  (1.575 m)   Wt 64 kg   SpO2 96%   BMI 25.81 kg/m  Physical Exam Vitals and nursing note reviewed.  Constitutional:      General: She is not in acute distress.    Appearance: She is well-developed. She is ill-appearing.  HENT:     Head: Normocephalic and atraumatic.  Eyes:     Conjunctiva/sclera: Conjunctivae normal.  Cardiovascular:     Rate and Rhythm: Regular rhythm. Tachycardia present.     Pulses: Normal pulses.     Heart sounds: No murmur heard. Pulmonary:     Effort: Pulmonary effort is normal. No respiratory  distress.     Breath sounds: Normal breath sounds.  Abdominal:     General: Abdomen is flat. Bowel sounds are normal.     Palpations: Abdomen is soft.     Tenderness: There is no abdominal tenderness.  Musculoskeletal:        General: No swelling.     Cervical back: Neck supple.  Skin:    General: Skin is warm and dry.     Capillary Refill: Capillary refill takes less than 2 seconds.     Findings: Lesion present.     Comments: Chronic leg wound on the medial aspect of the base of the right foot.  Neurological:     Mental Status: She is alert.  Psychiatric:        Mood and Affect: Mood normal.     ED Results / Procedures / Treatments   Labs (all labs ordered are listed, but only abnormal results are displayed) Labs Reviewed  COMPREHENSIVE METABOLIC PANEL - Abnormal; Notable for the following components:      Result Value   Glucose, Bld 158 (*)    BUN 24 (*)    Calcium 8.7 (*)    Albumin 2.6 (*)    All other components within normal limits  CBC WITH DIFFERENTIAL/PLATELET - Abnormal; Notable for the following components:   WBC 12.7 (*)    RBC 3.50 (*)    Hemoglobin 8.8 (*)    HCT 28.7 (*)    MCH 25.1 (*)    RDW 19.0 (*)    Neutro Abs 9.9 (*)    All other components within normal limits  CBG MONITORING, ED - Abnormal; Notable for the following components:   Glucose-Capillary 160 (*)    All other components within normal limits  SARS CORONAVIRUS 2 BY RT PCR  CULTURE, BLOOD (ROUTINE X 2)  CULTURE, BLOOD (ROUTINE X 2)  AMMONIA  URINALYSIS, W/ REFLEX TO CULTURE (INFECTION SUSPECTED)  CBG MONITORING, ED  I-STAT VENOUS BLOOD GAS, ED  I-STAT CG4 LACTIC ACID, ED  I-STAT CG4 LACTIC ACID, ED  POC OCCULT BLOOD, ED    EKG None  Radiology CT Angio Chest PE W and/or Wo Contrast  Result Date: 02/22/2023 CLINICAL DATA:  Altered history of lung cancer EXAM: CT ANGIOGRAPHY CHEST WITH CONTRAST TECHNIQUE: Multidetector CT imaging of the chest was performed using the standard  protocol during bolus administration of intravenous contrast. Multiplanar CT image reconstructions and MIPs were obtained to evaluate the vascular anatomy. RADIATION DOSE REDUCTION: This exam was performed according to the departmental dose-optimization program which includes automated exposure control, adjustment of the mA and/or kV according to patient size and/or use of iterative reconstruction technique. CONTRAST:  75mL OMNIPAQUE IOHEXOL 350 MG/ML SOLN COMPARISON:  CT 10/13/2022, 09/22/2022 FINDINGS: Cardiovascular:  Satisfactory opacification of the pulmonary arteries to the segmental level. No evidence of pulmonary embolism. Moderate aortic atherosclerosis. No aneurysm or dissection. Coronary vascular calcification. Normal cardiac size. No pericardial effusion Mediastinum/Nodes: Midline trachea. No thyroid mass. Esophagus within normal limits. Small right hilar nodes measuring up to 10 mm, no change. Confluent left hilar nodes as before. Lungs/Pleura: Areas of bronchiectasis and subpleural reticulation consistent with fibrosis. Occluded left upper lobe bronchi. Redemonstrated multiple bilateral spiculated pulmonary masses. Dominant right upper lobe spiculated pulmonary mass measures 4.2 x 3.1 cm, previously 3.5 x 3.2 cm. Large left upper lobe spiculated pulmonary mass on series 8, image 59, this measures 5.8 by 3.3 cm, previously 7.1 x 4.5 cm. Left lower lobe pulmonary mass measuring 2.4 by 1.8 cm on series 8, image 64, previously 3.1 x 2.3 cm. No pleural effusion or pneumothorax. Mild ground-glass density within the left upper lobe and bilateral lower lobes compared to prior chest CT. Upper Abdomen: No acute finding. Musculoskeletal: Chronic compression deformity at L1. No acute osseous abnormality. Review of the MIP images confirms the above findings. IMPRESSION: 1. Negative for acute pulmonary embolus. 2. Redemonstrated multiple bilateral spiculated pulmonary masses. Dominant right upper lobe spiculated mass  is slightly increased in size. Dominant left upper lobe spiculated mass is decreased in size. Left lower lobe spiculated mass is decreased in size. Confluent left hilar adenopathy redemonstrated. 3. Interval mild hazy pulmonary density in the left upper and bilateral lower lobes suggesting superimposed infectious or inflammatory process. Evidence for underlying chronic lung disease with areas of bronchiectasis and subpleural fibrosis 4. Aortic atherosclerosis. Aortic Atherosclerosis (ICD10-I70.0). Electronically Signed   By: Jasmine Pang M.D.   On: 02/22/2023 19:50   CT HEAD WO CONTRAST  Result Date: 02/22/2023 CLINICAL DATA:  Altered level of consciousness EXAM: CT HEAD WITHOUT CONTRAST TECHNIQUE: Contiguous axial images were obtained from the base of the skull through the vertex without intravenous contrast. RADIATION DOSE REDUCTION: This exam was performed according to the departmental dose-optimization program which includes automated exposure control, adjustment of the mA and/or kV according to patient size and/or use of iterative reconstruction technique. COMPARISON:  09/22/2022 FINDINGS: Brain: Confluent hypodensities throughout the periventricular white matter consistent with chronic small vessel ischemic changes. There is diffuse cerebral atrophy, age advanced. No evidence of acute infarct or hemorrhage. Stable ex vacuo dilatation of the lateral ventricles. Midline structures are unremarkable. No acute extra-axial fluid collections. No mass effect. Vascular: Stable atherosclerosis.  No hyperdense vessel. Skull: Stable sclerotic region within the right frontal region of the calvarium, likely benign given stability since 2022. No acute or destructive bony abnormality. Sinuses/Orbits: No acute finding. Other: None. IMPRESSION: 1. No acute intracranial process. Stable diffuse cerebral atrophy and chronic small vessel ischemic changes as above. Electronically Signed   By: Sharlet Salina M.D.   On:  02/22/2023 19:21   DG Chest Portable 1 View  Result Date: 02/22/2023 CLINICAL DATA:  Altered level of consciousness, history of lung cancer, prior tobacco abuse EXAM: PORTABLE CHEST 1 VIEW COMPARISON:  10/27/2022, 10/13/2022 FINDINGS: Single frontal view of the chest demonstrates a stable cardiac silhouette. Bilateral pulmonary masses again noted consistent with history of lung cancer. Since the prior x-ray, the left perihilar mass has decreased in size, but the right apical mass appears slightly larger. Severe background emphysema is again noted. Increased ground-glass opacities within the lung bases, which could reflect aspiration, edema, or infection. No effusion or pneumothorax. No acute bony abnormalities. IMPRESSION: 1. Bilateral pulmonary masses consistent with history of lung  cancer. The left perihilar mass is smaller, but the right apical mass has increased in size since prior study, compatible with mixed response to therapy. 2. Severe emphysema, with increased basilar predominant ground-glass airspace disease. This could reflect superimposed edema, infection, or aspiration. Electronically Signed   By: Sharlet Salina M.D.   On: 02/22/2023 16:02    Procedures .Critical Care  Performed by: Smitty Knudsen, PA-C Authorized by: Smitty Knudsen, PA-C   Critical care provider statement:    Critical care time (minutes):  47   Critical care start time:  02/22/2023 7:15 PM   Critical care end time:  02/22/2023 8:02 PM   Critical care time was exclusive of:  Separately billable procedures and treating other patients   Critical care was necessary to treat or prevent imminent or life-threatening deterioration of the following conditions:  Sepsis   Critical care was time spent personally by me on the following activities:  Blood draw for specimens, development of treatment plan with patient or surrogate, discussions with consultants, evaluation of patient's response to treatment, examination of patient,  obtaining history from patient or surrogate, pulse oximetry, ordering and review of radiographic studies, ordering and review of laboratory studies and re-evaluation of patient's condition   I assumed direction of critical care for this patient from another provider in my specialty: no     Care discussed with: admitting provider      Medications Ordered in ED Medications  sodium chloride 0.9 % bolus 1,000 mL (1,000 mLs Intravenous New Bag/Given 02/22/23 1847)    And  0.9 %  sodium chloride infusion (has no administration in time range)  lactated ringers infusion (has no administration in time range)  lactated ringers bolus 1,000 mL (has no administration in time range)    And  lactated ringers bolus 1,000 mL (has no administration in time range)  cefTRIAXone (ROCEPHIN) 2 g in sodium chloride 0.9 % 100 mL IVPB (has no administration in time range)  azithromycin (ZITHROMAX) 500 mg in sodium chloride 0.9 % 250 mL IVPB (has no administration in time range)  iohexol (OMNIPAQUE) 350 MG/ML injection 75 mL (75 mLs Intravenous Contrast Given 02/22/23 1756)    ED Course/ Medical Decision Making/ A&P                           Medical Decision Making Amount and/or Complexity of Data Reviewed Labs: ordered. Radiology: ordered.  Risk Prescription drug management. Decision regarding hospitalization.   This patient presents to the ED for concern of altered mental status. Differential diagnosis includes sepsis, pneumonia, bowel obstruction, PE, hypoglycemia   Lab Tests:  I Ordered, and personally interpreted labs.  The pertinent results include: CBG at 160, white blood count at 12.7, chronic anemia with declines and red blood count, hemoglobin and hematocrit, CMP is large unremarkable with preserved renal and liver function, ammonia normal at 18,hemoccult negative, UA, lactic acid, VBG, and blood cultures all pending at time of admission.   Imaging Studies ordered:  I ordered imaging studies  including chest x-ray, CT head, CT angio chest I independently visualized and interpreted imaging which showed masses consistent with prior history of lung cancer, severe emphysema with increased basilar groundglass opacities which could reflect edema, infection, or aspiration, CT head negative for any acute intracranial abnormality, CT angio chest showing Interval mild hazy pulmonary density in the left upper and bilateral lower lobes suggesting superimposed infectious or inflammatory process. Evidence for underlying chronic lung disease  with areas of bronchiectasis and subpleural fibrosis. Other changes to pulmonary nodules which patient has been receiving treatment for are also noted in report showing mixed response to radiative therapy. I agree with the radiologist interpretation   Medicines ordered and prescription drug management:  I ordered medication including fluids, Rocephin, Zithromax for sepsis Reevaluation of the patient after these medicines showed that the patient stayed the same I have reviewed the patients home medicines and have made adjustments as needed   Problem List / ED Course:  Patient presented to the ED with daughter concerned for AMS. Patient last known well was about 2 days ago. No prior history of similar episodes but family unable to point to any acute causes of symptoms. Daughter reports that patient seems somewhat more sleepy than normal and is not as verbal compared to baseline. History of stroke with chronic left sided deficits. Daughter did remark that she has been dealing with chronic wounds in the left lower legs that are still not healing well. Placed on 2L via [place] which is patient's baseline. Staff having difficulty with IV placement for lab draw. Will have phlebotomy arrive for lab draw. Labs drawn and in process. CT imaging performed with radiology read pending. CBC with mild leukocytosis at 12.7 and a decline in hemoglobin from 11.1-8.8 over the last 3  months.  Patient's daughter denies any evidence of GI bleed with no evidence of hematochezia, hematemesis, melena.  Also denies that patient has been constipated recently.  Will perform fecal occult testing for possible occult bleeding. Gross rectal examination reveals stool in vault but no gross blood seen. Will send off hemoccult for lab testing. Rectal temperature also collected which was 100.1 indicating low grade fever. Given combination of AMS, low grade temperature, and white count at 12.7 with chest xray showing some ground glass changes, will call code sepsis and initiate antibiotic treatment for suspected pneumonia. Rocephin and Zithromax ordered. Will consult hospitalist for admission.  Patient family is agreeable with inpatient admission. Spoke with Dr. Cyndia Bent, hospitalist, who will be admitting patient for continued treatment of sepsis in the setting of CAP causing AMS.  Final Clinical Impression(s) / ED Diagnoses Final diagnoses:  Sepsis without acute organ dysfunction, due to unspecified organism [facility])  Community acquired pneumonia of left upper lobe of lung  Altered mental status, unspecified altered mental status type    Rx / DC Orders ED Discharge Orders     None         Smitty Knudsen, PA-C 02/26/23 1610    Ernie Avena, MD 03/01/23 0945

## 2023-02-22 NOTE — ED Notes (Signed)
Attempted to place patient on bed pan, brief was wet. Will reattempt urinalysis after fluid bolus complete

## 2023-02-22 NOTE — ED Notes (Signed)
Pt transported to CT ?

## 2023-02-23 ENCOUNTER — Other Ambulatory Visit: Payer: Self-pay

## 2023-02-23 ENCOUNTER — Encounter (HOSPITAL_COMMUNITY): Payer: Self-pay | Admitting: Family Medicine

## 2023-02-23 ENCOUNTER — Inpatient Hospital Stay (HOSPITAL_COMMUNITY): Payer: 59

## 2023-02-23 DIAGNOSIS — J9611 Chronic respiratory failure with hypoxia: Secondary | ICD-10-CM | POA: Diagnosis not present

## 2023-02-23 DIAGNOSIS — G9341 Metabolic encephalopathy: Secondary | ICD-10-CM | POA: Diagnosis not present

## 2023-02-23 DIAGNOSIS — J690 Pneumonitis due to inhalation of food and vomit: Secondary | ICD-10-CM

## 2023-02-23 DIAGNOSIS — A419 Sepsis, unspecified organism: Secondary | ICD-10-CM | POA: Diagnosis not present

## 2023-02-23 DIAGNOSIS — N390 Urinary tract infection, site not specified: Secondary | ICD-10-CM | POA: Diagnosis present

## 2023-02-23 DIAGNOSIS — J432 Centrilobular emphysema: Secondary | ICD-10-CM | POA: Diagnosis not present

## 2023-02-23 LAB — CBC WITH DIFFERENTIAL/PLATELET
Abs Immature Granulocytes: 0.04 10*3/uL (ref 0.00–0.07)
Basophils Absolute: 0 10*3/uL (ref 0.0–0.1)
Basophils Relative: 0 %
Eosinophils Absolute: 0.2 10*3/uL (ref 0.0–0.5)
Eosinophils Relative: 2 %
HCT: 25.9 % — ABNORMAL LOW (ref 36.0–46.0)
Hemoglobin: 7.9 g/dL — ABNORMAL LOW (ref 12.0–15.0)
Immature Granulocytes: 0 %
Lymphocytes Relative: 13 %
Lymphs Abs: 1.4 10*3/uL (ref 0.7–4.0)
MCH: 24.6 pg — ABNORMAL LOW (ref 26.0–34.0)
MCHC: 30.5 g/dL (ref 30.0–36.0)
MCV: 80.7 fL (ref 80.0–100.0)
Monocytes Absolute: 0.5 10*3/uL (ref 0.1–1.0)
Monocytes Relative: 5 %
Neutro Abs: 8 10*3/uL — ABNORMAL HIGH (ref 1.7–7.7)
Neutrophils Relative %: 80 %
Platelets: 261 10*3/uL (ref 150–400)
RBC: 3.21 MIL/uL — ABNORMAL LOW (ref 3.87–5.11)
RDW: 18.8 % — ABNORMAL HIGH (ref 11.5–15.5)
WBC: 10.2 10*3/uL (ref 4.0–10.5)
nRBC: 0 % (ref 0.0–0.2)

## 2023-02-23 LAB — COMPREHENSIVE METABOLIC PANEL
ALT: 17 U/L (ref 0–44)
AST: 15 U/L (ref 15–41)
Albumin: 2.3 g/dL — ABNORMAL LOW (ref 3.5–5.0)
Alkaline Phosphatase: 58 U/L (ref 38–126)
Anion gap: 6 (ref 5–15)
BUN: 12 mg/dL (ref 8–23)
CO2: 26 mmol/L (ref 22–32)
Calcium: 8.4 mg/dL — ABNORMAL LOW (ref 8.9–10.3)
Chloride: 111 mmol/L (ref 98–111)
Creatinine, Ser: 0.75 mg/dL (ref 0.44–1.00)
GFR, Estimated: >60 mL/min (ref >60)
Glucose, Bld: 91 mg/dL (ref 70–99)
Potassium: 3.6 mmol/L (ref 3.5–5.1)
Sodium: 143 mmol/L (ref 135–145)
Total Bilirubin: 0.4 mg/dL (ref 0.3–1.2)
Total Protein: 7 g/dL (ref 6.5–8.1)

## 2023-02-23 LAB — PHOSPHORUS: Phosphorus: 2.4 mg/dL — ABNORMAL LOW (ref 2.5–4.6)

## 2023-02-23 LAB — GLUCOSE, CAPILLARY
Glucose-Capillary: 96 mg/dL (ref 70–99)
Glucose-Capillary: 175 mg/dL — ABNORMAL HIGH (ref 70–99)
Glucose-Capillary: 90 mg/dL (ref 70–99)

## 2023-02-23 LAB — MAGNESIUM: Magnesium: 1.9 mg/dL (ref 1.7–2.4)

## 2023-02-23 MED ORDER — LORAZEPAM 2 MG/ML IJ SOLN: 1.0000 mg | Freq: Once | INTRAMUSCULAR | Status: DC | PRN

## 2023-02-23 MED ORDER — LEVALBUTEROL HCL 0.63 MG/3ML IN NEBU: 0.6300 mg | INHALATION_SOLUTION | Freq: Four times a day (QID) | RESPIRATORY_TRACT | Status: DC | PRN

## 2023-02-23 MED ORDER — ENOXAPARIN SODIUM 40 MG/0.4ML IJ SOSY
40.0000 mg | PREFILLED_SYRINGE | Freq: Every day | INTRAMUSCULAR | Status: DC
  Administered 2023-02-24 – 2023-02-26 (×4): 40 mg via SUBCUTANEOUS
  Filled 2023-02-23 (×4): qty 0.4

## 2023-02-23 MED ORDER — IPRATROPIUM BROMIDE 0.02 % IN SOLN
0.5000 mg | Freq: Four times a day (QID) | RESPIRATORY_TRACT | Status: DC | PRN
  Filled 2023-02-23: qty 2.5

## 2023-02-23 MED ORDER — GUAIFENESIN ER 600 MG PO TB12
1200.0000 mg | ORAL_TABLET | Freq: Two times a day (BID) | ORAL | Status: DC
  Administered 2023-02-24 – 2023-02-27 (×8): 1200 mg via ORAL
  Filled 2023-02-23 (×8): qty 2

## 2023-02-23 MED ORDER — MEDIHONEY WOUND/BURN DRESSING EX PSTE
1.0000 | PASTE | Freq: Every day | CUTANEOUS | Status: DC
  Administered 2023-02-23 – 2023-02-27 (×5): 1 via TOPICAL
  Filled 2023-02-23 (×2): qty 44

## 2023-02-23 NOTE — Consult Note (Signed)
WOC Nurse Consult Note: patient noted to have history of PAD in review of EMR (last seen by vascular 03/2022); had peripheral vascular intervention in 2022 Reason for Consult:B foot wounds  Wound type: 1.  Evolving Deep tissue Pressure Injury R medial foot 2.  Stage 2 PI R medial heel 3.  Stage 3/4 PI L lateral malleolus (slough present ? Cartilage)  4.  Unstageable Pressure Injury L hip, L medial knee   Pressure Injury POA: Yes Measurement: 1.  Deep tissue Pressure injury that has evolved to full thickness R medial foot 3 cm x 3 cm x 0.1 cm 50% pink moist 50% black ?hemorrhagic tissue  2.  Stage 2 R medial heel 3 cm x 2 cm 100% pink dry  3.  Stage 3 L lateral malleolus 4 cm x 2 cm x 0.2 cm 75% pink moist 25% yellow mix of slough and ? Cartilage  4.  Unstageable PI L hip 2 cm x 2 cm 100% black tan eschar, Unstageable L medial knee 1 cm x 1 cm 100% dry brown eschar    Drainage (amount, consistency, odor) sanguinous from r medial foot and L malleolus; minimal tan from Unstageable L hip  Periwound: peeling skin noted to bilateral feet, intact around L hip and L knee wounds  Dressing procedure/placement/frequency:  Clean R medial foot with Vashe wound cleanser [facility] [phone]) and apply Xeroform gauze [facility] [phone]) to wound bed daily, cover with Telfa non-stick dressing and silicone foam. Place foot in Prevalon boot.   Clean L lateral malleolus wound with Vashe  wound cleanser, apply Vashe moistened (not saturated) gauze to wound bed twice daily.  Cover with dry gauze and silicone foam dressing. Place foot in Prevalon boot.  Clean Unstageable PI to L hip and L medial knee with Vashe wound cleanser, apply Medihoney to wound beds daily, cover with dry gauze and silicone foam. May lift foam daily to reapply Medihoney.  Change foam dressing q3 days and prn soiling.   Patient should remain on low air loss mattress throughout hospitalization for pressure redistribution and moisture management. Patients  bilateral feet should be placed in Prevalon boots to offload pressure.   POC discussed with patient, bedside nurse and primary MD. WOC team will not follow at this time. Re-consult if further needs arise.   Thank you,    Priscella Mann MSN, RN-BC, [employer] [phone]

## 2023-02-23 NOTE — Progress Notes (Signed)
PROGRESS NOTE    Crystal Moore  ZOX:[id number] DOB: 15-Aug-1945 DOA: 02/22/2023 PCP: Lorenda Ishihara, MD   Brief Narrative:  HPI per Dr. Benita Gutter on 02/22/23 Crystal Moore is a 77 y.o. female with medical history significant of Stage IV non-small cell lung cancer on palliative radiation, CVA with left-sided residual deficit, interstitial lung disease with chronic hypoxemia on 2 L, type 2 diabetes, PVD, gastric AVMs, COPD who presents with altered mental status.   Daughter at bedside provides hx as pt has dementia and is oriented only to self.  Aid at home told daughter that pt was not eating today, having tremors. Over the weekend has been more non-verbal. Her oxygen concentrator also ran out. Pt also sometimes get tired of wearing her oxygen and takes it off. She is on 2L O2 at baseline. Pt mostly bed bound or sit in recliner chair throughout the day.  New cough yesterday. No fever. No vomiting or diarrhea. Had wounds on both feet that recently came back after healing up with wound care for the past year.    Pt had last palliative radiation in May and daughter feels her symptoms of cough and sputum production has improved significantly.    In the ED, she had temperature 100.1 F, heart rate of 102, normal blood pressure and on her baseline 2 L.   Mild leukocytosis of 12.7, hemoglobin 8.8 which is down from baseline of 9-10.  Hemoccult is negative however.   No significant electrolyte abnormalities.   CT head negative   CTA chest was negative for acute pulmonary embolism and redemonstrated known multiple bilateral spiculated pulmonary masses.  There is interval mild hazy pulmonary density in the left upper and bilateral lower lobe suggesting superimposed infection or inflammatory process.  There is also a filling defect within the trachea that could represent debris versus possible tracheal mass.   She was given sepsis protocol fluid and started on broad-spectrum IV antibiotics for  community-acquired pneumonia.  Hospitalist consulted for admission.   **Interim History Wound care evaluated and we will obtain a MRI of her ankle given her pain and findings.  Will continue treatment and daughter thinks she is doing better.  SLP to further evaluate and they are recommending dysphagia 3 mechanical soft diet with thin liquids.  Assessment and Plan:  Sepsis (HCC) likely 2/2 Aspiration Pneumonia -secondary to likely aspiration pneumonia with CT chest demonstrating potential debris vs mass in the trachea along with hazy pulmonary density to upper and lower lobes.  Presented with elevated temperature of 100.1, heart rate of 102 and leukocytosis. -Started on IV Rocephin and azithromycin in the ED.  Will switch to IV Unasyn and continue -WBC and Latctic Acid Trend: Recent Labs  Lab 02/22/23 1800 02/22/23 2231 02/23/23 1013  WBC 12.7*  --  10.2  LATICACIDVEN  --  1.8  --   -Patient receiving sepsis fluid bolus -Keep on continuous fluid overnight -Add PRN Xopenex/Atrovent q6hprn, Guaifenesin 1200 mg po BID, and Flutter Valve and Incentive Spirometry    ?Tracheal mass -Questionable debris versus tracheal mass seen on CTA chest.  Dr. Cyndia Bent Discussed findings with daughter and  likely it is debris but would wait to show clinical improvement prior to rescanning  -PET-SCAN in March unfortunately had significant artifact in the neck    History of CVA (cerebrovascular accident) -With residual left-sided deficit.  Continue Plavix -Will need PT/OT to evaluate and treat   Acute metabolic encephalopathy superimposed on Chronic Dementia -Secondary to sepsis from aspiration  pneumonia in the setting of dementia -Patient only oriented to self at baseline -Continue to monitor clinically with IV antibiotics and is improving per the patient's daughter   Dementia without behavioral disturbance (HCC) -Baseline alert oriented only to self.  Mostly bed bound.   Pressure injury of skin of both  feet -stage IVwound of the left lateral malleolus and plantar surface of MTP joint of right foot -wound consult Pressure Injury 02/23/23 Foot Right;Medial Deep Tissue Pressure Injury - Purple or maroon localized area of discolored intact skin or blood-filled blister due to damage of underlying soft tissue from pressure and/or shear. DTPI evolving to full thickness (Active)  02/23/23 0700  Location: Foot  Location Orientation: Right;Medial  Staging: Deep Tissue Pressure Injury - Purple or maroon localized area of discolored intact skin or blood-filled blister due to damage of underlying soft tissue from pressure and/or shear.  Wound Description (Comments): DTPI evolving to full thickness 3 cm x 3 cm  Present on Admission: Yes     Pressure Injury 02/23/23 Ankle Left;Lateral Stage 3 -  Full thickness tissue loss. Subcutaneous fat may be visible but bone, tendon or muscle are NOT exposed. L lateral malleolus 4 cm x 2 cm (Active)  02/23/23 0000  Location: Ankle  Location Orientation: Left;Lateral  Staging: Stage 3 -  Full thickness tissue loss. Subcutaneous fat may be visible but bone, tendon or muscle are NOT exposed.  Wound Description (Comments): L lateral malleolus 4 cm x 2 cm  Present on Admission: Yes     Pressure Injury 02/23/23 Hip Left;Lateral Unstageable - Full thickness tissue loss in which the base of the injury is covered by slough (yellow, tan, gray, green or brown) and/or eschar (tan, brown or black) in the wound bed. 2 cm x 2 cm 100% black escha (Active)  02/23/23 0000  Location: Hip  Location Orientation: Left;Lateral  Staging: Unstageable - Full thickness tissue loss in which the base of the injury is covered by slough (yellow, tan, gray, green or brown) and/or eschar (tan, brown or black) in the wound bed.  Wound Description (Comments): 2 cm x 2 cm 100% black eschar  Present on Admission: Yes     Pressure Injury 02/23/23 Knee Left;Lateral 1 cm x 1 cm L lateral knee 100% brown  eschar (Active)  02/23/23 0805  Location: Knee  Location Orientation: Left;Lateral  Staging:   Wound Description (Comments): 1 cm x 1 cm L lateral knee 100% brown eschar  Present on Admission: Yes     Pressure Injury 02/23/23 Heel Medial;Right Stage 2 -  Partial thickness loss of dermis presenting as a shallow open injury with a red, pink wound bed without slough. 3 cm x 2 cm pink dry (Active)  02/23/23   Location: Heel  Location Orientation: Medial;Right  Staging: Stage 2 -  Partial thickness loss of dermis presenting as a shallow open injury with a red, pink wound bed without slough.  Wound Description (Comments): 3 cm x 2 cm pink dry  Present on Admission: Yes  -Check MRI of the Ankle on the Left Foot done and showed "Significantly motion degraded examination. No evidence of osteomyelitis, septic arthritis or soft tissue abscess. Mild nonspecific subcutaneous edema lateral to the distal fibula and in the lateral hindfoot. If further evaluation of suspected soft tissue infection is warranted clinically, consider CT with contrast which may be better tolerated by the patient. Radiographic correlation recommended regarding the possible bone lesion in the provided history."  Hypertension -Continue Amlodipine -Continue to  Monitor BP per Protocol    Diabetes Mellitus Type 2 (HCC) -Takes 25 units of Lantus daily -Hemoglobin A1c was 7.4 in February -Keep on sliding scale insulin for now -CBG and Glucose Trend: Recent Labs  Lab 02/22/23 1502 02/23/23 1010 02/23/23 1439 02/23/23 1618 02/23/23 2136  GLUCAP 160* 89 96 175* 90   Recent Labs  Lab 02/22/23 1800 02/23/23 1013  GLUCOSE 158* 91     ILD (interstitial lung disease) (HCC) Chronic hypoxemic respiratory failure -Admitted for aspiration pneumonia but remains on baseline home 2 L -Treatment as above   COPD (chronic obstructive pulmonary disease) (HCC) - Stable, does not appear to be in acute exacerbation SpO2: 98 % O2 Flow  Rate (L/min): 3 L/min -Wears 2 liters normally at baseline  Normocytic Anemia -Hgb/Hct Trend: Recent Labs  Lab 02/22/23 1800 02/23/23 1013  HGB 8.8* 7.9*  HCT 28.7* 25.9*  MCV 82.0 80.7  -Check Anemia Panel in the AM -Continue to Monitor for S/Sx of Bleeding; No overt bleeding noted -Repeat CBC in the AM   Hypoalbuminemia -Patient's Albumin Trend: Recent Labs  Lab 02/22/23 1800 02/23/23 1013  ALBUMIN 2.6* 2.3*  -Continue to Monitor and Trend and repeat CMP in the AM    DVT prophylaxis: Enoxaparin 40 mg sq q24h    Code Status: Prior Family Communication: Discussed with Daughter at bedside  Disposition Plan:  Level of care: Med-Surg Status is: Inpatient Remains inpatient appropriate because: Needs further clinical improvement and evaluation by PT/OT   Consultants:  WOC Nurse  Procedures:  As delineated as above  Antimicrobials:  Anti-infectives (From admission, onward)    Start     Dose/Rate Route Frequency Ordered Stop   02/23/23 0000  Ampicillin-Sulbactam (UNASYN) 3 g in sodium chloride 0.9 % 100 mL IVPB        3 g 200 mL/hr over 30 Minutes Intravenous Every 6 hours 02/22/23 2319     02/22/23 2000  cefTRIAXone (ROCEPHIN) 2 g in sodium chloride 0.9 % 100 mL IVPB  Status:  Discontinued        2 g 200 mL/hr over 30 Minutes Intravenous Every 24 hours 02/22/23 1945 02/22/23 2314   02/22/23 2000  azithromycin (ZITHROMAX) 500 mg in sodium chloride 0.9 % 250 mL IVPB  Status:  Discontinued        500 mg 250 mL/hr over 60 Minutes Intravenous Every 24 hours 02/22/23 1945 02/22/23 2314       Subjective: Examined at bedside and daughter.  Patient is doing little bit better.  Patient denies any complaints of pain currently.  No nausea or vomiting.  Patient's daughter thinks that her mom is coughing up some more sputum.  No other concerns or complaints at this time.  Objective: Vitals:   02/23/23 1217 02/23/23 1616 02/23/23 1929 02/23/23 2043  BP:  (!) 149/62 (!)  119/43 (!) 110/40  Pulse:  100 80 79  Resp:      Temp:  98.6 F (37 C) 99.6 F (37.6 C)   TempSrc:  Oral Oral   SpO2:  100% 98%   Weight: 61.3 kg     Height: 5\' 2"  (1.575 m)       Intake/Output Summary (Last 24 hours) at 02/23/2023 2315 Last data filed at 02/23/2023 1800 Gross per 24 hour  Intake 2710.47 ml  Output --  Net 2710.47 ml   Filed Weights   02/22/23 1452 02/23/23 1217  Weight: 64 kg 61.3 kg   Examination: Physical Exam:  Constitutional: Chronically ill-appearing African-American  elderly female in no acute distress Respiratory: Diminished to auscultation bilaterally with some coarse breath sounds and has some slight rhonchi.  No appreciable wheezing rales or crackles.  Has a normal respiratory effort but is wearing supplemental oxygen via nasal cannula,  Cardiovascular: RRR, no murmurs / rubs / gallops. S1 and S2 auscultated. No extremity edema.  Abdomen: Soft, non-tender, non-distended. Bowel sounds positive.  GU: Deferred. Skin: Has bilateral feet wounds noted as well as left hip wound Neurologic: CN 2-12 grossly intact with no focal deficits. Romberg sign cerebellar reflexes not assessed.  Psychiatric: Normal judgment and insight.  He is awake and alert but not fully oriented and only oriented to herself  Data Reviewed: I have personally reviewed following labs and imaging studies  CBC: Recent Labs  Lab 02/22/23 1800 02/23/23 1013  WBC 12.7* 10.2  NEUTROABS 9.9* 8.0*  HGB 8.8* 7.9*  HCT 28.7* 25.9*  MCV 82.0 80.7  PLT 283 261   Basic Metabolic Panel: Recent Labs  Lab 02/22/23 1800 02/23/23 1013  NA 139 143  K 3.9 3.6  CL 107 111  CO2 23 26  GLUCOSE 158* 91  BUN 24* 12  CREATININE 0.95 0.75  CALCIUM 8.7* 8.4*  MG  --  1.9  PHOS  --  2.4*   GFR: Estimated Creatinine Clearance: 51.6 mL/min (by C-G formula based on SCr of 0.75 mg/dL). Liver Function Tests: Recent Labs  Lab 02/22/23 1800 02/23/23 1013  AST 15 15  ALT 18 17  ALKPHOS 74 58   BILITOT 0.4 0.4  PROT 7.7 7.0  ALBUMIN 2.6* 2.3*   No results for input(s): "LIPASE", "AMYLASE" in the last 168 hours. Recent Labs  Lab 02/22/23 1800  AMMONIA 18   Coagulation Profile: No results for input(s): "INR", "PROTIME" in the last 168 hours. Cardiac Enzymes: No results for input(s): "CKTOTAL", "CKMB", "CKMBINDEX", "TROPONINI" in the last 168 hours. BNP (last 3 results) No results for input(s): "PROBNP" in the last 8760 hours. HbA1C: No results for input(s): "HGBA1C" in the last 72 hours. CBG: Recent Labs  Lab 02/22/23 1502 02/23/23 1010 02/23/23 1439 02/23/23 1618 02/23/23 2136  GLUCAP 160* 89 96 175* 90   Lipid Profile: No results for input(s): "CHOL", "HDL", "LDLCALC", "TRIG", "CHOLHDL", "LDLDIRECT" in the last 72 hours. Thyroid Function Tests: No results for input(s): "TSH", "T4TOTAL", "FREET4", "T3FREE", "THYROIDAB" in the last 72 hours. Anemia Panel: No results for input(s): "VITAMINB12", "FOLATE", "FERRITIN", "TIBC", "IRON", "RETICCTPCT" in the last 72 hours. Sepsis Labs: Recent Labs  Lab 02/22/23 2231  LATICACIDVEN 1.8   Recent Results (from the past 240 hour(s))  SARS Coronavirus 2 by RT PCR (hospital order, performed in [facility] hospital lab) *cepheid single result test* Anterior Nasal Swab     Status: None   Collection Time: 02/22/23  3:05 PM   Specimen: Anterior Nasal Swab  Result Value Ref Range Status   SARS Coronavirus 2 by RT PCR NEGATIVE NEGATIVE Final    Comment: Performed at [facility], [address] [place], [place] [address]  Blood Culture (Routine X 2)     Status: None (Preliminary result)   Collection Time: 02/22/23  6:00 PM   Specimen: BLOOD RIGHT WRIST  Result Value Ref Range Status   Specimen Description BLOOD RIGHT WRIST  Final   Special Requests   Final    BOTTLES DRAWN AEROBIC AND ANAEROBIC Blood Culture results may not be optimal due to an inadequate volume of blood received in culture bottles   Culture  Final     NO GROWTH < 24 HOURS Performed at [facility], [address], [place], [place] [address]    Report Status PENDING  Incomplete  Blood Culture (Routine X 2)     Status: None (Preliminary result)   Collection Time: 02/22/23  6:10 PM   Specimen: BLOOD LEFT FOREARM  Result Value Ref Range Status   Specimen Description BLOOD LEFT FOREARM  Final   Special Requests   Final    BOTTLES DRAWN AEROBIC AND ANAEROBIC Blood Culture results may not be optimal due to an inadequate volume of blood received in culture bottles   Culture   Final    NO GROWTH < 24 HOURS Performed at [facility], [address], [place], [place] [address]    Report Status PENDING  Incomplete    Radiology Studies: MR ANKLE LEFT WO CONTRAST  Result Date: 02/23/2023 CLINICAL DATA:  Lateral ankle wound. Bone mass or bone pain, ankle, aggressive features on x-ray. EXAM: MRI OF THE LEFT ANKLE WITHOUT CONTRAST TECHNIQUE: Multiplanar, multisequence MR imaging of the ankle was performed. No intravenous contrast was administered. COMPARISON:  None recent. Left foot radiographs 06/27/2021. Left ankle radiographs 08/09/2020. FINDINGS: Despite efforts by the technologist and patient, moderate to severe motion artifact is present on today's exam and could not be eliminated. This reduces exam sensitivity and specificity. Patient was unable to complete the examination. No postcontrast imaging obtained. Bones/Joint/Cartilage No evidence of acute fracture, dislocation or bone destruction. No significant joint effusions or arthropathic changes identified. No aggressive osseous lesions are seen. Ligaments Ligamentous assessment limited by motion. No ligamentous abnormalities are identified. Muscles and Tendons The medial flexor, anterior extensor, peroneal and Achilles tendons appear intact. No evidence of significant tenosynovitis or focal muscular abnormality. Generalized muscular atrophy noted. The plantar fascia is intact. Soft  tissue Soft tissue assessment limited by motion. There appears to be mild subcutaneous edema lateral to the distal fibula and in the lateral hindfoot, although no focal fluid collection or discrete soft tissue ulcer identified. IMPRESSION: 1. Significantly motion degraded examination. 2. No evidence of osteomyelitis, septic arthritis or soft tissue abscess. 3. Mild nonspecific subcutaneous edema lateral to the distal fibula and in the lateral hindfoot. 4. If further evaluation of suspected soft tissue infection is warranted clinically, consider CT with contrast which may be better tolerated by the patient. Radiographic correlation recommended regarding the possible bone lesion in the provided history. Electronically Signed   By: Carey Bullocks M.D.   On: 02/23/2023 15:59   DG CHEST PORT 1 VIEW  Result Date: 02/23/2023 CLINICAL DATA:  Shortness of breath EXAM: PORTABLE CHEST 1 VIEW COMPARISON:  X-ray and CT angiogram 02/22/2023 FINDINGS: Overlapping cardiac leads. Stable cardiopericardial silhouette with calcified aorta. Film is less well inflated today compared to prior but there appears to be increasing interstitial and hazy lung opacities bilaterally. Mass lesions are again seen in the upper lung zones. No pneumothorax or effusion. IMPRESSION: Worsening inflation with increasing interstitial and hazy lung opacities. Recommend follow up Electronically Signed   By: Karen Kays M.D.   On: 02/23/2023 10:38   CT Angio Chest PE W and/or Wo Contrast  Addendum Date: 02/22/2023   ADDENDUM REPORT: 02/22/2023 20:52 ADDENDUM: Ovoid filling defect measuring 1.6 by 1.2 cm within the trachea, series 7, image 42, this could represent debris though attention on follow-up imaging to exclude tracheal mass. Electronically Signed   By: Jasmine Pang M.D.   On: 02/22/2023 20:52   Result  Date: 02/22/2023 CLINICAL DATA:  Altered history of lung cancer EXAM: CT ANGIOGRAPHY CHEST WITH CONTRAST TECHNIQUE: Multidetector CT  imaging of the chest was performed using the standard protocol during bolus administration of intravenous contrast. Multiplanar CT image reconstructions and MIPs were obtained to evaluate the vascular anatomy. RADIATION DOSE REDUCTION: This exam was performed according to the departmental dose-optimization program which includes automated exposure control, adjustment of the mA and/or kV according to patient size and/or use of iterative reconstruction technique. CONTRAST:  75mL OMNIPAQUE IOHEXOL 350 MG/ML SOLN COMPARISON:  CT 10/13/2022, 09/22/2022 FINDINGS: Cardiovascular: Satisfactory opacification of the pulmonary arteries to the segmental level. No evidence of pulmonary embolism. Moderate aortic atherosclerosis. No aneurysm or dissection. Coronary vascular calcification. Normal cardiac size. No pericardial effusion Mediastinum/Nodes: Midline trachea. No thyroid mass. Esophagus within normal limits. Small right hilar nodes measuring up to 10 mm, no change. Confluent left hilar nodes as before. Lungs/Pleura: Areas of bronchiectasis and subpleural reticulation consistent with fibrosis. Occluded left upper lobe bronchi. Redemonstrated multiple bilateral spiculated pulmonary masses. Dominant right upper lobe spiculated pulmonary mass measures 4.2 x 3.1 cm, previously 3.5 x 3.2 cm. Large left upper lobe spiculated pulmonary mass on series 8, image 59, this measures 5.8 by 3.3 cm, previously 7.1 x 4.5 cm. Left lower lobe pulmonary mass measuring 2.4 by 1.8 cm on series 8, image 64, previously 3.1 x 2.3 cm. No pleural effusion or pneumothorax. Mild ground-glass density within the left upper lobe and bilateral lower lobes compared to prior chest CT. Upper Abdomen: No acute finding. Musculoskeletal: Chronic compression deformity at L1. No acute osseous abnormality. Review of the MIP images confirms the above findings. IMPRESSION: 1. Negative for acute pulmonary embolus. 2. Redemonstrated multiple bilateral spiculated  pulmonary masses. Dominant right upper lobe spiculated mass is slightly increased in size. Dominant left upper lobe spiculated mass is decreased in size. Left lower lobe spiculated mass is decreased in size. Confluent left hilar adenopathy redemonstrated. 3. Interval mild hazy pulmonary density in the left upper and bilateral lower lobes suggesting superimposed infectious or inflammatory process. Evidence for underlying chronic lung disease with areas of bronchiectasis and subpleural fibrosis 4. Aortic atherosclerosis. Aortic Atherosclerosis (ICD10-I70.0). Electronically Signed: By: Jasmine Pang M.D. On: 02/22/2023 19:50   CT HEAD WO CONTRAST  Result Date: 02/22/2023 CLINICAL DATA:  Altered level of consciousness EXAM: CT HEAD WITHOUT CONTRAST TECHNIQUE: Contiguous axial images were obtained from the base of the skull through the vertex without intravenous contrast. RADIATION DOSE REDUCTION: This exam was performed according to the departmental dose-optimization program which includes automated exposure control, adjustment of the mA and/or kV according to patient size and/or use of iterative reconstruction technique. COMPARISON:  09/22/2022 FINDINGS: Brain: Confluent hypodensities throughout the periventricular white matter consistent with chronic small vessel ischemic changes. There is diffuse cerebral atrophy, age advanced. No evidence of acute infarct or hemorrhage. Stable ex vacuo dilatation of the lateral ventricles. Midline structures are unremarkable. No acute extra-axial fluid collections. No mass effect. Vascular: Stable atherosclerosis.  No hyperdense vessel. Skull: Stable sclerotic region within the right frontal region of the calvarium, likely benign given stability since 2022. No acute or destructive bony abnormality. Sinuses/Orbits: No acute finding. Other: None. IMPRESSION: 1. No acute intracranial process. Stable diffuse cerebral atrophy and chronic small vessel ischemic changes as above.  Electronically Signed   By: Sharlet Salina M.D.   On: 02/22/2023 19:21   DG Chest Portable 1 View  Result Date: 02/22/2023 CLINICAL DATA:  Altered level of consciousness, history of lung  cancer, prior tobacco abuse EXAM: PORTABLE CHEST 1 VIEW COMPARISON:  10/27/2022, 10/13/2022 FINDINGS: Single frontal view of the chest demonstrates a stable cardiac silhouette. Bilateral pulmonary masses again noted consistent with history of lung cancer. Since the prior x-ray, the left perihilar mass has decreased in size, but the right apical mass appears slightly larger. Severe background emphysema is again noted. Increased ground-glass opacities within the lung bases, which could reflect aspiration, edema, or infection. No effusion or pneumothorax. No acute bony abnormalities. IMPRESSION: 1. Bilateral pulmonary masses consistent with history of lung cancer. The left perihilar mass is smaller, but the right apical mass has increased in size since prior study, compatible with mixed response to therapy. 2. Severe emphysema, with increased basilar predominant ground-glass airspace disease. This could reflect superimposed edema, infection, or aspiration. Electronically Signed   By: Sharlet Salina M.D.   On: 02/22/2023 16:02    Scheduled Meds:  amLODipine  5 mg Oral Daily   atorvastatin  80 mg Oral QPM   clopidogrel  75 mg Oral Daily   feeding supplement  237 mL Oral BID BM   folic acid  1 mg Oral Daily   gabapentin  300 mg Oral TID   [START ON 02/24/2023] guaiFENesin  1,200 mg Oral BID   insulin aspart  0-9 Units Subcutaneous TID WC   leptospermum manuka honey  1 Application Topical Daily   pantoprazole  40 mg Oral BID   Continuous Infusions:  sodium chloride 100 mL/hr at 02/23/23 1011   ampicillin-sulbactam (UNASYN) IV 3 g (02/23/23 1839)    LOS: 1 day   Marguerita Merles, DO [facility] Available via Epic secure chat 7am-7pm After these hours, please refer to coverage provider listed on  [email or web address] 02/23/2023, 11:15 PM

## 2023-02-23 NOTE — Hospital Course (Signed)
HPI per Dr. Benita Gutter on 02/22/23 Crystal Moore is a 77 y.o. female with medical history significant of Stage IV non-small cell lung cancer on palliative radiation, CVA with left-sided residual deficit, interstitial lung disease with chronic hypoxemia on 2 L, type 2 diabetes, PVD, gastric AVMs, COPD who presents with altered mental status.   Daughter at bedside provides hx as pt has dementia and is oriented only to self.  Aid at home told daughter that pt was not eating today, having tremors. Over the weekend has been more non-verbal. Her oxygen concentrator also ran out. Pt also sometimes get tired of wearing her oxygen and takes it off. She is on 2L O2 at baseline. Pt mostly bed bound or sit in recliner chair throughout the day.  New cough yesterday. No fever. No vomiting or diarrhea. Had wounds on both feet that recently came back after healing up with wound care for the past year.    Pt had last palliative radiation in May and daughter feels her symptoms of cough and sputum production has improved significantly.    In the ED, she had temperature 100.1 F, heart rate of 102, normal blood pressure and on her baseline 2 L.   Mild leukocytosis of 12.7, hemoglobin 8.8 which is down from baseline of 9-10.  Hemoccult is negative however.   No significant electrolyte abnormalities.   CT head negative   CTA chest was negative for acute pulmonary embolism and redemonstrated known multiple bilateral spiculated pulmonary masses.  There is interval mild hazy pulmonary density in the left upper and bilateral lower lobe suggesting superimposed infection or inflammatory process.  There is also a filling defect within the trachea that could represent debris versus possible tracheal mass.   She was given sepsis protocol fluid and started on broad-spectrum IV antibiotics for community-acquired pneumonia.  Hospitalist consulted for admission.   **Interim History Wound care evaluated and we will obtain a MRI of  her ankle given her pain and findings.  Will continue treatment and daughter thinks she is doing better.  SLP to further evaluate and they are recommending dysphagia 3 mechanical soft diet with thin liquids.  Assessment and Plan:  Sepsis (HCC) likely 2/2 Aspiration Pneumonia -secondary to likely aspiration pneumonia with CT chest demonstrating potential debris vs mass in the trachea along with hazy pulmonary density to upper and lower lobes.  Presented with elevated temperature of 100.1, heart rate of 102 and leukocytosis. -Started on IV Rocephin and azithromycin in the ED.  Will switch to IV Unasyn and continue -WBC and Latctic Acid Trend: Recent Labs  Lab 02/22/23 1800 02/22/23 2231 02/23/23 1013  WBC 12.7*  --  10.2  LATICACIDVEN  --  1.8  --   -Patient receiving sepsis fluid bolus -Keep on continuous fluid overnight -Add PRN Xopenex/Atrovent q6hprn, Guaifenesin 1200 mg po BID, and Flutter Valve and Incentive Spirometry    ?Tracheal mass -Questionable debris versus tracheal mass seen on CTA chest.  Dr. Cyndia Bent Discussed findings with daughter and  likely it is debris but would wait to show clinical improvement prior to rescanning  -PET-SCAN in March unfortunately had significant artifact in the neck    History of CVA (cerebrovascular accident) -With residual left-sided deficit.  Continue Plavix -Will need PT/OT to evaluate and treat   Acute metabolic encephalopathy superimposed on Chronic Dementia -Secondary to sepsis from aspiration pneumonia in the setting of dementia -Patient only oriented to self at baseline -Continue to monitor clinically with IV antibiotics and is improving  per the patient's daughter   Dementia without behavioral disturbance (HCC) -Baseline alert oriented only to self.  Mostly bed bound.   Pressure injury of skin of both feet -stage IVwound of the left lateral malleolus and plantar surface of MTP joint of right foot -wound consult Pressure Injury 02/23/23  Foot Right;Medial Deep Tissue Pressure Injury - Purple or maroon localized area of discolored intact skin or blood-filled blister due to damage of underlying soft tissue from pressure and/or shear. DTPI evolving to full thickness (Active)  02/23/23 0700  Location: Foot  Location Orientation: Right;Medial  Staging: Deep Tissue Pressure Injury - Purple or maroon localized area of discolored intact skin or blood-filled blister due to damage of underlying soft tissue from pressure and/or shear.  Wound Description (Comments): DTPI evolving to full thickness 3 cm x 3 cm  Present on Admission: Yes     Pressure Injury 02/23/23 Ankle Left;Lateral Stage 3 -  Full thickness tissue loss. Subcutaneous fat may be visible but bone, tendon or muscle are NOT exposed. L lateral malleolus 4 cm x 2 cm (Active)  02/23/23 0000  Location: Ankle  Location Orientation: Left;Lateral  Staging: Stage 3 -  Full thickness tissue loss. Subcutaneous fat may be visible but bone, tendon or muscle are NOT exposed.  Wound Description (Comments): L lateral malleolus 4 cm x 2 cm  Present on Admission: Yes     Pressure Injury 02/23/23 Hip Left;Lateral Unstageable - Full thickness tissue loss in which the base of the injury is covered by slough (yellow, tan, gray, green or brown) and/or eschar (tan, brown or black) in the wound bed. 2 cm x 2 cm 100% black escha (Active)  02/23/23 0000  Location: Hip  Location Orientation: Left;Lateral  Staging: Unstageable - Full thickness tissue loss in which the base of the injury is covered by slough (yellow, tan, gray, green or brown) and/or eschar (tan, brown or black) in the wound bed.  Wound Description (Comments): 2 cm x 2 cm 100% black eschar  Present on Admission: Yes     Pressure Injury 02/23/23 Knee Left;Lateral 1 cm x 1 cm L lateral knee 100% brown eschar (Active)  02/23/23 0805  Location: Knee  Location Orientation: Left;Lateral  Staging:   Wound Description (Comments): 1 cm x 1  cm L lateral knee 100% brown eschar  Present on Admission: Yes     Pressure Injury 02/23/23 Heel Medial;Right Stage 2 -  Partial thickness loss of dermis presenting as a shallow open injury with a red, pink wound bed without slough. 3 cm x 2 cm pink dry (Active)  02/23/23   Location: Heel  Location Orientation: Medial;Right  Staging: Stage 2 -  Partial thickness loss of dermis presenting as a shallow open injury with a red, pink wound bed without slough.  Wound Description (Comments): 3 cm x 2 cm pink dry  Present on Admission: Yes  -Check MRI of the Ankle on the Left Foot done and showed "Significantly motion degraded examination. No evidence of osteomyelitis, septic arthritis or soft tissue abscess. Mild nonspecific subcutaneous edema lateral to the distal fibula and in the lateral hindfoot. If further evaluation of suspected soft tissue infection is warranted clinically, consider CT with contrast which may be better tolerated by the patient. Radiographic correlation recommended regarding the possible bone lesion in the provided history."  Hypertension -Continue Amlodipine -Continue to Monitor BP per Protocol    Diabetes Mellitus Type 2 (HCC) -Takes 25 units of Lantus daily -Hemoglobin A1c was 7.4 in  February -Keep on sliding scale insulin for now -CBG and Glucose Trend: Recent Labs  Lab 02/22/23 1502 02/23/23 1010 02/23/23 1439 02/23/23 1618 02/23/23 2136  GLUCAP 160* 89 96 175* 90   Recent Labs  Lab 02/22/23 1800 02/23/23 1013  GLUCOSE 158* 91     ILD (interstitial lung disease) (HCC) Chronic hypoxemic respiratory failure -Admitted for aspiration pneumonia but remains on baseline home 2 L -Treatment as above   COPD (chronic obstructive pulmonary disease) (HCC) - Stable, does not appear to be in acute exacerbation SpO2: 98 % O2 Flow Rate (L/min): 3 L/min -Wears 2 liters normally at baseline  Normocytic Anemia -Hgb/Hct Trend: Recent Labs  Lab 02/22/23 1800  02/23/23 1013  HGB 8.8* 7.9*  HCT 28.7* 25.9*  MCV 82.0 80.7  -Check Anemia Panel in the AM -Continue to Monitor for S/Sx of Bleeding; No overt bleeding noted -Repeat CBC in the AM   Hypoalbuminemia -Patient's Albumin Trend: Recent Labs  Lab 02/22/23 1800 02/23/23 1013  ALBUMIN 2.6* 2.3*  -Continue to Monitor and Trend and repeat CMP in the AM

## 2023-02-23 NOTE — Evaluation (Signed)
Clinical/Bedside Swallow Evaluation Patient Details  Name: Crystal Moore MRN: [id number] Date of Birth: 20-Feb-1946  Today's Date: 02/23/2023 Time: SLP Start Time (ACUTE ONLY): 1500 SLP Stop Time (ACUTE ONLY): 1515 SLP Time Calculation (min) (ACUTE ONLY): 15 min  Past Medical History:  Past Medical History:  Diagnosis Date   Abnormal gait    wheelchair bound   Anemia 06/24/2022   IDA   Arthritis    COPD (chronic obstructive pulmonary disease) (HCC)    COVID-19 virus infection 07/2020   CVA (cerebral vascular accident) (HCC) 09/22/2022   with residual left-sided weakness   Dementia (HCC)    Depression    no current problem   Diabetes mellitus without complication (HCC)    GERD (gastroesophageal reflux disease)    Headache    Hemiparesis affecting left side as late effect of cerebrovascular accident (CVA) (HCC)    w/c bound   HOH (hard of hearing) 01/13/2018   no hearing aids   Hypertension    Mass of lung    bilateral masses   PAD (peripheral artery disease) (HCC)    in CE   Pneumonia    x 4   Pyelonephritis    Seizures (HCC) 09/25/2022   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   Sensorineural hearing loss    bilateral in CE, no hearing aids   Stroke [facility])    Tobacco use disorder 2019   quit 2019   UTI (urinary tract infection)    Wheelchair dependent    Past Surgical History:  Past Surgical History:  Procedure Laterality Date   ABDOMINAL AORTOGRAM W/LOWER EXTREMITY N/A 08/30/2020   Procedure: ABDOMINAL AORTOGRAM W/LOWER EXTREMITY;  Surgeon: Leonie Douglas, MD;  Location: [facility];  Service: Cardiovascular;  Laterality: N/A;   ABDOMINAL AORTOGRAM W/LOWER EXTREMITY N/A 09/04/2020   Procedure: ABDOMINAL AORTOGRAM W/LOWER EXTREMITY;  Surgeon: Leonie Douglas, MD;  Location: [facility];  Service: Cardiovascular;  Laterality: N/A;   ESOPHAGOGASTRODUODENOSCOPY (EGD) WITH PROPOFOL N/A 09/16/2020   Procedure: ESOPHAGOGASTRODUODENOSCOPY  (EGD) WITH PROPOFOL;  Surgeon: Shellia Cleverly, DO;  Location: [facility] ENDOSCOPY;  Service: Gastroenterology;  Laterality: N/A;   FINE NEEDLE ASPIRATION  10/27/2022   Procedure: FINE NEEDLE ASPIRATION (FNA) LINEAR;  Surgeon: Josephine Igo, DO;  Location: [facility] ENDOS[facility];  Service: Pulmonary;;   HEMOSTASIS CLIP PLACEMENT  09/16/2020   Procedure: HEMOSTASIS CLIP PLACEMENT;  Surgeon: Shellia Cleverly, DO;  Location: [facility];  Service: Gastroenterology;;   HOT HEMOSTASIS N/A 09/16/2020   Procedure: HOT HEMOSTASIS (ARGON PLASMA COAGULATION/BICAP);  Surgeon: Shellia Cleverly, DO;  Location: [facility] ENDOSCOPY;  Service: Gastroenterology;  Laterality: N/A;   PERIPHERAL VASCULAR INTERVENTION Right 08/30/2020   Procedure: PERIPHERAL VASCULAR INTERVENTION;  Surgeon: Leonie Douglas, MD;  Location: [facility];  Service: Cardiovascular;  Laterality: Right;  femoral popliteal   PERIPHERAL VASCULAR INTERVENTION Left 09/04/2020   Procedure: PERIPHERAL VASCULAR INTERVENTION;  Surgeon: Leonie Douglas, MD;  Location: [facility];  Service: Cardiovascular;  Laterality: Left;  SFA   TUBAL LIGATION     VIDEO BRONCHOSCOPY WITH ENDOBRONCHIAL ULTRASOUND Bilateral 10/27/2022   Procedure: VIDEO BRONCHOSCOPY WITH ENDOBRONCHIAL ULTRASOUND;  Surgeon: Josephine Igo, DO;  Location: [facility] ENDOS[facility];  Service: Pulmonary;  Laterality: Bilateral;   HPI:  Patient is a 77 y.o. female with PMH: tage IV non-small cell lung cancer on palliative radiation, CVA with left-sided residual deficit, interstitial lung disease with chronic hypoxemia on 2 L, type 2 diabetes, PVD, gastric AVMs,  COPD, dementia. She presented to the hospital on 02/22/23 with AMS. Daughter provided history in ED and patient is only oriented to self at baseline. She finished her last palliative radiation treatment in May of 2024. In ED, patient with 100.1 degrees F temperature, HR 102, normal BP. CTA chest was negative for acute pulmonary embolism and  redemonstrated known multiple bilateral spiculated pulmonary masses.  There is interval mild hazy pulmonary density in the left upper and bilateral lower lobe suggesting superimposed infection or inflammatory process.  There is also a filling defect within the trachea that could represent debris versus possible tracheal mass. She was admitted for sepsis and ?tracheal mass.    Assessment / Plan / Recommendation  Clinical Impression  Patient presnts with clinical s/s of dysphagia as per this bedside swallow evaluation. She is edentulous at baseline and does not wear dentures. Per daughter, she eats solid foods but they cut things up for her. Patient was awake and alert, recently back from MRI. SLP and patient's daughter were able to reposition her more upright in bed. SLP assessed patient's swallow function with PO consistencies of thin liquids, puree solids and mechanical soft solids. She was able to feed self without difficulty after SLP handed her or held out food items. Prolonged mastication to be expected as she is edentulous. With thin liquids, puree solids, suspected swallow initiation delay. She did exhibit occasional throat clearing which sounded congested, however patient did not demonstrate ability to expectorate. Difficult to determine if this cough was related to PO's or her chronic cough. SLP recommending initiate PO diet of mechanical soft solids (dys 3) and thin liquids. SLP will follow briefly for toleration. SLP Visit Diagnosis: Dysphagia, unspecified (R13.10)    Aspiration Risk  No limitations;Mild aspiration risk    Diet Recommendation Dysphagia 3 (Mech soft);Thin liquid    Liquid Administration via: Cup;Straw Medication Administration: Whole meds with puree Supervision: Patient able to self feed;Full supervision/cueing for compensatory strategies Compensations: Slow rate;Small sips/bites;Minimize environmental distractions Postural Changes: Seated upright at 90 degrees    Other   Recommendations Oral Care Recommendations: Oral care BID;Staff/trained caregiver to provide oral care    Recommendations for follow up therapy are one component of a multi-disciplinary discharge planning process, led by the attending physician.  Recommendations may be updated based on patient status, additional functional criteria and insurance authorization.  Follow up Recommendations Follow physician's recommendations for discharge plan and follow up therapies      Assistance Recommended at Discharge    Functional Status Assessment Patient has had a recent decline in their functional status and demonstrates the ability to make significant improvements in function in a reasonable and predictable amount of time.  Frequency and Duration min 2x/week  1 week       Prognosis Prognosis for improved oropharyngeal function: Good      Swallow Study   General Date of Onset: 02/22/23 HPI: Patient is a 77 y.o. female with PMH: tage IV non-small cell lung cancer on palliative radiation, CVA with left-sided residual deficit, interstitial lung disease with chronic hypoxemia on 2 L, type 2 diabetes, PVD, gastric AVMs, COPD, dementia. She presented to the hospital on 02/22/23 with AMS. Daughter provided history in ED and patient is only oriented to self at baseline. She finished her last palliative radiation treatment in May of 2024. In ED, patient with 100.1 degrees F temperature, HR 102, normal BP. CTA chest was negative for acute pulmonary embolism and redemonstrated known multiple bilateral spiculated pulmonary masses.  There  is interval mild hazy pulmonary density in the left upper and bilateral lower lobe suggesting superimposed infection or inflammatory process.  There is also a filling defect within the trachea that could represent debris versus possible tracheal mass. She was admitted for sepsis and ?tracheal mass. Type of Study: Bedside Swallow Evaluation Previous Swallow Assessment: none found Diet  Prior to this Study: NPO Temperature Spikes Noted: No Respiratory Status: Nasal cannula History of Recent Intubation: No Behavior/Cognition: Alert;Pleasant mood;Cooperative;Confused Oral Cavity Assessment: Within Functional Limits Oral Care Completed by SLP: No Oral Cavity - Dentition: Edentulous Self-Feeding Abilities: Able to feed self;Needs set up Patient Positioning: Upright in bed Baseline Vocal Quality: Normal Volitional Cough: Cognitively unable to elicit Volitional Swallow: Unable to elicit    Oral/Motor/Sensory Function Overall Oral Motor/Sensory Function: Within functional limits   Ice Chips     Thin Liquid Thin Liquid: Impaired Presentation: Straw;Self Fed Pharyngeal  Phase Impairments: Suspected delayed Swallow;Throat Clearing - Delayed    Nectar Thick     Honey Thick     Puree Puree: Impaired Pharyngeal Phase Impairments: Suspected delayed Swallow   Solid     Solid: Impaired Pharyngeal Phase Impairments: Suspected delayed Swallow;Throat Clearing - Delayed     Angela Nevin, MA, CCC-SLP Speech Therapy

## 2023-02-23 NOTE — Progress Notes (Signed)
SLP Cancellation Note  Patient Details Name: Crystal Moore MRN: [id number] DOB: 1946/01/13   Cancelled treatment:       Reason Eval/Treat Not Completed: Patient at procedure or test/unavailable. SLP will f/u when able.   Angela Nevin, MA, CCC-SLP Speech Therapy

## 2023-02-23 NOTE — ED Notes (Signed)
ED TO INPATIENT HANDOFF REPORT   S Name/Age/Gender Crystal Moore 77 y.o. female Room/Bed: 044C/044C  Code Status   Code Status: Prior  Home/SNF/Other Home Patient oriented to: self Is this baseline? Yes   Triage Complete: Triage complete  Chief Complaint Sepsis [facility]) [A41.9]  Triage Note Pt BIB EMS from home. Family said since Saturday has seemed more altered, nonverbal most of the time which is not baseline. Has hx of stroke with left sided deficits, lung cancer treatment completed recently. Alert to self. On 2L at baseline.   Allergies Allergies  Allergen Reactions   Aspirin     Can not take due to previous GI bleed - per daughter    Level of Care/Admitting Diagnosis ED Disposition     ED Disposition  Admit   Condition  --   Comment  Hospital Area: [facility] [[id number]]  Level of Care: Telemetry Medical [104]  May admit patient to [facility] or [facility] if equivalent level of care is available:: No  Covid Evaluation: Asymptomatic - no recent exposure (last 10 days) testing not required  Diagnosis: Sepsis [facility]) [[id number]]  Admitting Physician: Anselm Jungling [[id number]]  Attending Physician: Anselm Jungling [[id number]]  Certification:: I certify this patient will need inpatient services for at least 2 midnights  Estimated Length of Stay: 2          B Medical/Surgery History Past Medical History:  Diagnosis Date   Abnormal gait    wheelchair bound   Anemia 06/24/2022   IDA   Arthritis    COPD (chronic obstructive pulmonary disease) (HCC)    COVID-19 virus infection 07/2020   CVA (cerebral vascular accident) (HCC) 09/22/2022   with residual left-sided weakness   Dementia (HCC)    Depression    no current problem   Diabetes mellitus without complication (HCC)    GERD (gastroesophageal reflux disease)    Headache    Hemiparesis affecting left side as late effect of cerebrovascular accident (CVA) (HCC)    w/c bound   HOH (hard of  hearing) 01/13/2018   no hearing aids   Hypertension    Mass of lung    bilateral masses   PAD (peripheral artery disease) (HCC)    in CE   Pneumonia    x 4   Pyelonephritis    Seizures (HCC) 09/25/2022   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   Sensorineural hearing loss    bilateral in CE, no hearing aids   Stroke [facility])    Tobacco use disorder 2019   quit 2019   UTI (urinary tract infection)    Wheelchair dependent    Past Surgical History:  Procedure Laterality Date   ABDOMINAL AORTOGRAM W/LOWER EXTREMITY N/A 08/30/2020   Procedure: ABDOMINAL AORTOGRAM W/LOWER EXTREMITY;  Surgeon: Leonie Douglas, MD;  Location: [facility] INVASIVE CV LAB;  Service: Cardiovascular;  Laterality: N/A;   ABDOMINAL AORTOGRAM W/LOWER EXTREMITY N/A 09/04/2020   Procedure: ABDOMINAL AORTOGRAM W/LOWER EXTREMITY;  Surgeon: Leonie Douglas, MD;  Location: [facility] INVASIVE CV LAB;  Service: Cardiovascular;  Laterality: N/A;   ESOPHAGOGASTRODUODENOSCOPY (EGD) WITH PROPOFOL N/A 09/16/2020   Procedure: ESOPHAGOGASTRODUODENOSCOPY (EGD) WITH PROPOFOL;  Surgeon: Shellia Cleverly, DO;  Location: [facility] ENDOSCOPY;  Service: Gastroenterology;  Laterality: N/A;   FINE NEEDLE ASPIRATION  10/27/2022   Procedure: FINE NEEDLE ASPIRATION (FNA) LINEAR;  Surgeon: Josephine Igo, DO;  Location: [facility] ENDOSCOPY;  Service: Pulmonary;;   HEMOSTASIS CLIP PLACEMENT  09/16/2020   Procedure: HEMOSTASIS  CLIP PLACEMENT;  Surgeon: Shellia Cleverly, DO;  Location: [facility] ENDOSCOPY;  Service: Gastroenterology;;   HOT HEMOSTASIS N/A 09/16/2020   Procedure: HOT HEMOSTASIS (ARGON PLASMA COAGULATION/BICAP);  Surgeon: Shellia Cleverly, DO;  Location: [facility] ENDOSCOPY;  Service: Gastroenterology;  Laterality: N/A;   PERIPHERAL VASCULAR INTERVENTION Right 08/30/2020   Procedure: PERIPHERAL VASCULAR INTERVENTION;  Surgeon: Leonie Douglas, MD;  Location: [facility] INVAS[facility] CV LAB;  Service: Cardiovascular;  Laterality: Right;  femoral popliteal   PERIPHERAL  VASCULAR INTERVENTION Left 09/04/2020   Procedure: PERIPHERAL VASCULAR INTERVENTION;  Surgeon: Leonie Douglas, MD;  Location: [facility] CV LAB;  Service: Cardiovascular;  Laterality: Left;  SFA   TUBAL LIGATION     VIDEO BRONCHOSCOPY WITH ENDOBRONCHIAL ULTRASOUND Bilateral 10/27/2022   Procedure: VIDEO BRONCHOSCOPY WITH ENDOBRONCHIAL ULTRASOUND;  Surgeon: Josephine Igo, DO;  Location: [facility] ENDOSCOPY;  Service: Pulmonary;  Laterality: Bilateral;     A IV Location/Drains/Wounds Patient Lines/Drains/Airways Status     Active Line/Drains/Airways     Name Placement date Placement time Site Days   Peripheral IV 02/22/23 22 G 1.75" Anterior;Left Forearm 02/22/23  2058  Forearm  1   Pressure Injury 02/23/23 Foot Right;Medial Deep Tissue Pressure Injury - Purple or maroon localized area of discolored intact skin or blood-filled blister due to damage of underlying soft tissue from pressure and/or shear. DTPI evolving to full thickness 02/23/23  0700  -- less than 1   Pressure Injury 02/23/23 Ankle Left;Lateral Stage 3 -  Full thickness tissue loss. Subcutaneous fat may be visible but bone, tendon or muscle are NOT exposed. L lateral malleolus 4 cm x 2 cm 02/23/23  0000  -- less than 1   Pressure Injury 02/23/23 Hip Left;Lateral Unstageable - Full thickness tissue loss in which the base of the injury is covered by slough (yellow, tan, gray, green or brown) and/or eschar (tan, brown or black) in the wound bed. 2 cm x 2 cm 100% black escha 02/23/23  0000  -- less than 1   Pressure Injury 02/23/23 Knee Left;Lateral 1 cm x 1 cm L lateral knee 100% brown eschar 02/23/23  0805  -- less than 1   Pressure Injury 02/23/23 Heel Medial;Right Stage 2 -  Partial thickness loss of dermis presenting as a shallow open injury with a red, pink wound bed without slough. 3 cm x 2 cm pink dry 02/23/23  --  -- less than 1            Intake/Output Last 24 hours  Intake/Output Summary (Last 24 hours) at 02/23/2023  1033 Last data filed at 02/23/2023 0047 Gross per 24 hour  Intake 3350.47 ml  Output --  Net 3350.47 ml    Labs/Imaging Results for orders placed or performed during the hospital encounter of 02/22/23 (from the past 48 hour(s))  CBG monitoring, ED     Status: Abnormal   Collection Time: 02/22/23  3:02 PM  Result Value Ref Range   Glucose-Capillary 160 (H) 70 - 99 mg/dL    Comment: Glucose reference range applies only to samples taken after fasting for at least 8 hours.  SARS Coronavirus 2 by RT PCR (hospital order, performed in [facility] hospital lab) *cepheid single result test* Anterior Nasal Swab     Status: None   Collection Time: 02/22/23  3:05 PM   Specimen: Anterior Nasal Swab  Result Value Ref Range   SARS Coronavirus 2 by RT PCR NEGATIVE NEGATIVE    Comment: Performed at [facility] Lab,  [address] [place], [place] [address]  Comprehensive metabolic panel     Status: Abnormal   Collection Time: 02/22/23  6:00 PM  Result Value Ref Range   Sodium 139 135 - 145 mmol/L   Potassium 3.9 3.5 - 5.1 mmol/L   Chloride 107 98 - 111 mmol/L   CO2 23 22 - 32 mmol/L   Glucose, Bld 158 (H) 70 - 99 mg/dL    Comment: Glucose reference range applies only to samples taken after fasting for at least 8 hours.   BUN 24 (H) 8 - 23 mg/dL   Creatinine, Ser 6.21 0.44 - 1.00 mg/dL   Calcium 8.7 (L) 8.9 - 10.3 mg/dL   Total Protein 7.7 6.5 - 8.1 g/dL   Albumin 2.6 (L) 3.5 - 5.0 g/dL   AST 15 15 - 41 U/L   ALT 18 0 - 44 U/L   Alkaline Phosphatase 74 38 - 126 U/L   Total Bilirubin 0.4 0.3 - 1.2 mg/dL   GFR, Estimated >30 >86 mL/min    Comment: (NOTE) Calculated using the CKD-EPI Creatinine Equation (2021)    Anion gap 9 5 - 15    Comment: Performed at [facility], [address], [place], [place] [address]  CBC with Differential/Platelet     Status: Abnormal   Collection Time: 02/22/23  6:00 PM  Result Value Ref Range   WBC 12.7 (H) 4.0 - 10.5 K/uL   RBC 3.50 (L) 3.87 -  5.11 MIL/uL   Hemoglobin 8.8 (L) 12.0 - 15.0 g/dL   HCT 96.2 (L) 95.2 - 84.1 %   MCV 82.0 80.0 - 100.0 fL   MCH 25.1 (L) 26.0 - 34.0 pg   MCHC 30.7 30.0 - 36.0 g/dL   RDW 32.4 (H) 40.1 - 02.7 %   Platelets 283 150 - 400 K/uL   nRBC 0.0 0.0 - 0.2 %   Neutrophils Relative % 79 %   Neutro Abs 9.9 (H) 1.7 - 7.7 K/uL   Lymphocytes Relative 14 %   Lymphs Abs 1.8 0.7 - 4.0 K/uL   Monocytes Relative 7 %   Monocytes Absolute 0.9 0.1 - 1.0 K/uL   Eosinophils Relative 0 %   Eosinophils Absolute 0.0 0.0 - 0.5 K/uL   Basophils Relative 0 %   Basophils Absolute 0.0 0.0 - 0.1 K/uL   Immature Granulocytes 0 %   Abs Immature Granulocytes 0.04 0.00 - 0.07 K/uL    Comment: Performed at [facility], [address] [place], [place] [address]  Ammonia     Status: None   Collection Time: 02/22/23  6:00 PM  Result Value Ref Range   Ammonia 18 9 - 35 umol/L    Comment: Performed at [facility], [address], [place], [place] [address]  Blood Culture (Routine X 2)     Status: None (Preliminary result)   Collection Time: 02/22/23  6:00 PM   Specimen: BLOOD RIGHT WRIST  Result Value Ref Range   Specimen Description BLOOD RIGHT WRIST    Special Requests      BOTTLES DRAWN AEROBIC AND ANAEROBIC Blood Culture results may not be optimal due to an inadequate volume of blood received in culture bottles   Culture      NO GROWTH < 24 HOURS Performed at [facility], [address], [place], [place] [address]    Report Status PENDING   Blood Culture (Routine X 2)     Status: None (Preliminary  result)   Collection Time: 02/22/23  6:10 PM   Specimen: BLOOD LEFT FOREARM  Result Value Ref Range   Specimen Description BLOOD LEFT FOREARM    Special Requests      BOTTLES DRAWN AEROBIC AND ANAEROBIC Blood Culture results may not be optimal due to an inadequate volume of blood received in culture bottles   Culture      NO GROWTH < 24 HOURS Performed at [facility], [address], [place], [place] [address]    Report Status PENDING   POC occult blood, ED Provider will collect     Status: None   Collection Time: 02/22/23  7:50 PM  Result Value Ref Range   Fecal Occult Bld NEGATIVE NEGATIVE  Lactic acid, plasma     Status: None   Collection Time: 02/22/23 10:31 PM  Result Value Ref Range   Lactic Acid, Venous 1.8 0.5 - 1.9 mmol/L    Comment: Performed at [facility], [address], [place], [place] [address]  Urinalysis, w/ Reflex to Culture (Infection Suspected) -Urine, Clean Catch     Status: Abnormal   Collection Time: 02/23/23  6:52 AM  Result Value Ref Range   Specimen Source URINE, CLEAN CATCH    Color, Urine YELLOW YELLOW   APPearance CLEAR CLEAR   Specific Gravity, Urine 1.035 (H) 1.005 - 1.030   pH 5.0 5.0 - 8.0   Glucose, UA NEGATIVE NEGATIVE mg/dL   Hgb urine dipstick NEGATIVE NEGATIVE   Bilirubin Urine NEGATIVE NEGATIVE   Ketones, ur NEGATIVE NEGATIVE mg/dL   Protein, ur NEGATIVE NEGATIVE mg/dL   Nitrite NEGATIVE NEGATIVE   Leukocytes,Ua NEGATIVE NEGATIVE   RBC / HPF 0-5 0 - 5 RBC/hpf   WBC, UA 0-5 0 - 5 WBC/hpf    Comment:        Reflex urine culture not performed if WBC <=10, OR if Squamous epithelial cells >5. If Squamous epithelial cells >5 suggest recollection.    Bacteria, UA NONE SEEN NONE SEEN   Squamous Epithelial / HPF 0-5 0 - 5 /HPF    Comment: Performed at [facility], [address], [place], [place] [address]  CBG monitoring, ED     Status: None   Collection Time: 02/23/23 10:10 AM  Result Value Ref Range   Glucose-Capillary 89 70 - 99 mg/dL    Comment: Glucose reference range applies only to samples taken after fasting for at least 8 hours.  CBC with Differential/Platelet     Status: Abnormal   Collection Time: 02/23/23 10:13 AM  Result Value Ref Range   WBC 10.2 4.0 - 10.5 K/uL   RBC 3.21 (L) 3.87 - 5.11 MIL/uL   Hemoglobin 7.9 (L) 12.0 - 15.0 g/dL   HCT 88.4 (L) 16.6 - 06.3 %   MCV 80.7 80.0 - 100.0 fL    MCH 24.6 (L) 26.0 - 34.0 pg   MCHC 30.5 30.0 - 36.0 g/dL   RDW 01.6 (H) 01.0 - 93.2 %   Platelets 261 150 - 400 K/uL   nRBC 0.0 0.0 - 0.2 %   Neutrophils Relative % 80 %   Neutro Abs 8.0 (H) 1.7 - 7.7 K/uL   Lymphocytes Relative 13 %   Lymphs Abs 1.4 0.7 - 4.0 K/uL   Monocytes Relative 5 %   Monocytes Absolute 0.5 0.1 - 1.0 K/uL   Eosinophils Relative 2 %   Eosinophils Absolute 0.2 0.0 - 0.5 K/uL   Basophils Relative 0 %  Basophils Absolute 0.0 0.0 - 0.1 K/uL   Immature Granulocytes 0 %   Abs Immature Granulocytes 0.04 0.00 - 0.07 K/uL    Comment: Performed at [facility], [address] [place], [place] [address]   CT Angio Chest PE W and/or Wo Contrast  Addendum Date: 02/22/2023   ADDENDUM REPORT: 02/22/2023 20:52 ADDENDUM: Ovoid filling defect measuring 1.6 by 1.2 cm within the trachea, series 7, image 42, this could represent debris though attention on follow-up imaging to exclude tracheal mass. Electronically Signed   By: Jasmine Pang M.D.   On: 02/22/2023 20:52   Result Date: 02/22/2023 CLINICAL DATA:  Altered history of lung cancer EXAM: CT ANGIOGRAPHY CHEST WITH CONTRAST TECHNIQUE: Multidetector CT imaging of the chest was performed using the standard protocol during bolus administration of intravenous contrast. Multiplanar CT image reconstructions and MIPs were obtained to evaluate the vascular anatomy. RADIATION DOSE REDUCTION: This exam was performed according to the departmental dose-optimization program which includes automated exposure control, adjustment of the mA and/or kV according to patient size and/or use of iterative reconstruction technique. CONTRAST:  75mL OMNIPAQUE IOHEXOL 350 MG/ML SOLN COMPARISON:  CT 10/13/2022, 09/22/2022 FINDINGS: Cardiovascular: Satisfactory opacification of the pulmonary arteries to the segmental level. No evidence of pulmonary embolism. Moderate aortic atherosclerosis. No aneurysm or dissection. Coronary vascular calcification.  Normal cardiac size. No pericardial effusion Mediastinum/Nodes: Midline trachea. No thyroid mass. Esophagus within normal limits. Small right hilar nodes measuring up to 10 mm, no change. Confluent left hilar nodes as before. Lungs/Pleura: Areas of bronchiectasis and subpleural reticulation consistent with fibrosis. Occluded left upper lobe bronchi. Redemonstrated multiple bilateral spiculated pulmonary masses. Dominant right upper lobe spiculated pulmonary mass measures 4.2 x 3.1 cm, previously 3.5 x 3.2 cm. Large left upper lobe spiculated pulmonary mass on series 8, image 59, this measures 5.8 by 3.3 cm, previously 7.1 x 4.5 cm. Left lower lobe pulmonary mass measuring 2.4 by 1.8 cm on series 8, image 64, previously 3.1 x 2.3 cm. No pleural effusion or pneumothorax. Mild ground-glass density within the left upper lobe and bilateral lower lobes compared to prior chest CT. Upper Abdomen: No acute finding. Musculoskeletal: Chronic compression deformity at L1. No acute osseous abnormality. Review of the MIP images confirms the above findings. IMPRESSION: 1. Negative for acute pulmonary embolus. 2. Redemonstrated multiple bilateral spiculated pulmonary masses. Dominant right upper lobe spiculated mass is slightly increased in size. Dominant left upper lobe spiculated mass is decreased in size. Left lower lobe spiculated mass is decreased in size. Confluent left hilar adenopathy redemonstrated. 3. Interval mild hazy pulmonary density in the left upper and bilateral lower lobes suggesting superimposed infectious or inflammatory process. Evidence for underlying chronic lung disease with areas of bronchiectasis and subpleural fibrosis 4. Aortic atherosclerosis. Aortic Atherosclerosis (ICD10-I70.0). Electronically Signed: By: Jasmine Pang M.D. On: 02/22/2023 19:50   CT HEAD WO CONTRAST  Result Date: 02/22/2023 CLINICAL DATA:  Altered level of consciousness EXAM: CT HEAD WITHOUT CONTRAST TECHNIQUE: Contiguous axial  images were obtained from the base of the skull through the vertex without intravenous contrast. RADIATION DOSE REDUCTION: This exam was performed according to the departmental dose-optimization program which includes automated exposure control, adjustment of the mA and/or kV according to patient size and/or use of iterative reconstruction technique. COMPARISON:  09/22/2022 FINDINGS: Brain: Confluent hypodensities throughout the periventricular white matter consistent with chronic small vessel ischemic changes. There is diffuse cerebral atrophy, age advanced. No evidence of acute infarct or hemorrhage. Stable ex vacuo dilatation of  the lateral ventricles. Midline structures are unremarkable. No acute extra-axial fluid collections. No mass effect. Vascular: Stable atherosclerosis.  No hyperdense vessel. Skull: Stable sclerotic region within the right frontal region of the calvarium, likely benign given stability since 2022. No acute or destructive bony abnormality. Sinuses/Orbits: No acute finding. Other: None. IMPRESSION: 1. No acute intracranial process. Stable diffuse cerebral atrophy and chronic small vessel ischemic changes as above. Electronically Signed   By: Sharlet Salina M.D.   On: 02/22/2023 19:21   DG Chest Portable 1 View  Result Date: 02/22/2023 CLINICAL DATA:  Altered level of consciousness, history of lung cancer, prior tobacco abuse EXAM: PORTABLE CHEST 1 VIEW COMPARISON:  10/27/2022, 10/13/2022 FINDINGS: Single frontal view of the chest demonstrates a stable cardiac silhouette. Bilateral pulmonary masses again noted consistent with history of lung cancer. Since the prior x-ray, the left perihilar mass has decreased in size, but the right apical mass appears slightly larger. Severe background emphysema is again noted. Increased ground-glass opacities within the lung bases, which could reflect aspiration, edema, or infection. No effusion or pneumothorax. No acute bony abnormalities. IMPRESSION: 1.  Bilateral pulmonary masses consistent with history of lung cancer. The left perihilar mass is smaller, but the right apical mass has increased in size since prior study, compatible with mixed response to therapy. 2. Severe emphysema, with increased basilar predominant ground-glass airspace disease. This could reflect superimposed edema, infection, or aspiration. Electronically Signed   By: Sharlet Salina M.D.   On: 02/22/2023 16:02    Pending Labs Unresulted Labs (From admission, onward)     Start     Ordered   02/23/23 0936  Comprehensive metabolic panel  Once,   R        02/23/23 0935   02/23/23 0936  Phosphorus  Once,   R        02/23/23 0935   02/23/23 0936  Magnesium  Once,   R        02/23/23 0935            Vitals/Pain Today's Vitals   02/23/23 0830 02/23/23 0930 02/23/23 1000 02/23/23 1030  BP: (!) 122/42 (!) 132/50 (!) 147/49 (!) 152/55  Pulse: 73 76 82 80  Resp: 17 17 15  (!) 21  Temp:  97.9 F (36.6 C)    TempSrc:      SpO2: 99% 97% 98% 95%  Weight:      Height:      PainSc:        Isolation Precautions No active isolations  Medications Medications  sodium chloride 0.9 % bolus 1,000 mL (0 mLs Intravenous Stopped 02/22/23 1947)    And  0.9 %  sodium chloride infusion ( Intravenous Rate/Dose Change 02/23/23 1011)  amLODipine (NORVASC) tablet 5 mg (0 mg Oral Hold 02/23/23 0957)  atorvastatin (LIPITOR) tablet 80 mg (has no administration in time range)  pantoprazole (PROTONIX) EC tablet 40 mg (0 mg Oral Hold 02/23/23 0959)  clopidogrel (PLAVIX) tablet 75 mg (75 mg Oral Not Given 02/23/23 0958)  folic acid (FOLVITE) tablet 1 mg (0 mg Oral Hold 02/23/23 0959)  gabapentin (NEURONTIN) capsule 300 mg (0 mg Oral Hold 02/23/23 0959)  feeding supplement (ENSURE ENLIVE / ENSURE PLUS) liquid 237 mL (0 mLs Oral Hold 02/23/23 0958)  insulin aspart (novoLOG) injection 0-9 Units ( Subcutaneous Not Given 02/23/23 1013)  Ampicillin-Sulbactam (UNASYN) 3 g in sodium chloride 0.9 % 100 mL  IVPB (0 g Intravenous Stopped 02/23/23 0859)  leptospermum manuka honey (MEDIHONEY) paste 1 Application (  1 Application Topical Given 02/23/23 1015)  iohexol (OMNIPAQUE) 350 MG/ML injection 75 mL (75 mLs Intravenous Contrast Given 02/22/23 1756)  lactated ringers bolus 1,000 mL (0 mLs Intravenous Stopped 02/22/23 2316)    And  lactated ringers bolus 1,000 mL (0 mLs Intravenous Stopped 02/22/23 2316)    Mobility non-ambulatory      R Recommendations: See Admitting Provider Note  Report given to:   Additional Notes:  Patient lives at home with daughter. Daughter currently at bedside.

## 2023-02-24 ENCOUNTER — Inpatient Hospital Stay (HOSPITAL_COMMUNITY): Payer: 59

## 2023-02-24 DIAGNOSIS — G9341 Metabolic encephalopathy: Secondary | ICD-10-CM | POA: Diagnosis not present

## 2023-02-24 DIAGNOSIS — E119 Type 2 diabetes mellitus without complications: Secondary | ICD-10-CM

## 2023-02-24 DIAGNOSIS — J449 Chronic obstructive pulmonary disease, unspecified: Secondary | ICD-10-CM

## 2023-02-24 DIAGNOSIS — F0390 Unspecified dementia without behavioral disturbance: Secondary | ICD-10-CM | POA: Diagnosis not present

## 2023-02-24 DIAGNOSIS — J690 Pneumonitis due to inhalation of food and vomit: Secondary | ICD-10-CM

## 2023-02-24 DIAGNOSIS — L89899 Pressure ulcer of other site, unspecified stage: Secondary | ICD-10-CM | POA: Diagnosis not present

## 2023-02-24 DIAGNOSIS — A419 Sepsis, unspecified organism: Secondary | ICD-10-CM | POA: Diagnosis not present

## 2023-02-24 LAB — RENAL FUNCTION PANEL
Albumin: 2.3 g/dL — ABNORMAL LOW (ref 3.5–5.0)
Anion gap: 7 (ref 5–15)
BUN: 8 mg/dL (ref 8–23)
CO2: 26 mmol/L (ref 22–32)
Calcium: 8.3 mg/dL — ABNORMAL LOW (ref 8.9–10.3)
Chloride: 108 mmol/L (ref 98–111)
Creatinine, Ser: 0.73 mg/dL (ref 0.44–1.00)
GFR, Estimated: >60 mL/min (ref >60)
Glucose, Bld: 141 mg/dL — ABNORMAL HIGH (ref 70–99)
Phosphorus: 2.2 mg/dL — ABNORMAL LOW (ref 2.5–4.6)
Potassium: 3.5 mmol/L (ref 3.5–5.1)
Sodium: 141 mmol/L (ref 135–145)

## 2023-02-24 LAB — CBC WITH DIFFERENTIAL/PLATELET
Abs Immature Granulocytes: 0.04 10*3/uL (ref 0.00–0.07)
Basophils Absolute: 0 10*3/uL (ref 0.0–0.1)
Basophils Relative: 0 %
Eosinophils Absolute: 0.3 10*3/uL (ref 0.0–0.5)
Eosinophils Relative: 3 %
HCT: 23.1 % — ABNORMAL LOW (ref 36.0–46.0)
Hemoglobin: 7.3 g/dL — ABNORMAL LOW (ref 12.0–15.0)
Immature Granulocytes: 0 %
Lymphocytes Relative: 13 %
Lymphs Abs: 1.4 10*3/uL (ref 0.7–4.0)
MCH: 25.5 pg — ABNORMAL LOW (ref 26.0–34.0)
MCHC: 31.6 g/dL (ref 30.0–36.0)
MCV: 80.8 fL (ref 80.0–100.0)
Monocytes Absolute: 0.5 10*3/uL (ref 0.1–1.0)
Monocytes Relative: 4 %
Neutro Abs: 8.3 10*3/uL — ABNORMAL HIGH (ref 1.7–7.7)
Neutrophils Relative %: 80 %
Platelets: 307 10*3/uL (ref 150–400)
RBC: 2.86 MIL/uL — ABNORMAL LOW (ref 3.87–5.11)
RDW: 18.7 % — ABNORMAL HIGH (ref 11.5–15.5)
WBC: 10.5 10*3/uL (ref 4.0–10.5)
nRBC: 0 % (ref 0.0–0.2)

## 2023-02-24 LAB — GLUCOSE, CAPILLARY
Glucose-Capillary: 123 mg/dL — ABNORMAL HIGH (ref 70–99)
Glucose-Capillary: 154 mg/dL — ABNORMAL HIGH (ref 70–99)
Glucose-Capillary: 147 mg/dL — ABNORMAL HIGH (ref 70–99)
Glucose-Capillary: 148 mg/dL — ABNORMAL HIGH (ref 70–99)
Glucose-Capillary: 132 mg/dL — ABNORMAL HIGH (ref 70–99)

## 2023-02-24 LAB — MAGNESIUM: Magnesium: 1.9 mg/dL (ref 1.7–2.4)

## 2023-02-24 MED ORDER — K PHOS MONO-SOD PHOS DI & MONO 155-852-130 MG PO TABS
250.0000 mg | ORAL_TABLET | Freq: Two times a day (BID) | ORAL | Status: DC
  Administered 2023-02-24 – 2023-02-27 (×7): 250 mg via ORAL
  Filled 2023-02-24 (×7): qty 1

## 2023-02-24 MED ORDER — POTASSIUM CHLORIDE CRYS ER 10 MEQ PO TBCR
40.0000 meq | EXTENDED_RELEASE_TABLET | Freq: Once | ORAL | Status: AC
  Administered 2023-02-24: 40 meq via ORAL
  Filled 2023-02-24: qty 4

## 2023-02-24 MED ORDER — ACETAMINOPHEN 325 MG PO TABS
650.0000 mg | ORAL_TABLET | ORAL | Status: DC | PRN
  Administered 2023-02-24 – 2023-02-27 (×5): 650 mg via ORAL
  Filled 2023-02-24 (×5): qty 2

## 2023-02-24 NOTE — Progress Notes (Signed)
Transition of Care [facility]) - Inpatient Brief Assessment   Patient Details  Name: Crystal Moore MRN: [id number] Date of Birth: Oct 22, 1945  [facility] CM/SW Contact:    Janae Bridgeman, RN Phone Number: 02/24/2023, 2:28 PM   Clinical Narrative: Cm met with the patient at the bedside and no family in the room at this time.  The patient is unable to  provide history due to dementia.  I called the daughter and discussed TOC needs.  The patient lives with the daughter in the home and admitted for Sepsis.  Patient has chronic wounds in the home that the daughter provides wound care daily.  Patient also has CAP services 7 days a week.  I called and spoke with the daughter and provided Medicare choice regarding home health services and the daughter confirmed that she was recently discharged from [facility].   I called Amy Hyatt, RNCM with [phone] and she accepted for [facility] services - orders placed for [facility] services for PT/OT since agency does not have RN availability at this time but will follow up.  I asked for attending to co-sign the orders.  Patient has all needed DME at the home including [employer] lift.  When the patient is medically stable - she will return home by daughter's car.   Transition of Care Asessment: Insurance and Status: (P) Insurance coverage has been reviewed Patient has primary care physician: (P) Yes Home environment has been reviewed: (P) Yes - lives with daughter Prior level of function:: (P) dependent on care in the home - Active with CAPS program 7 days/week Prior/Current Home Services: (P) Current home services (Active with CAP services) Social Determinants of Health Reivew: (P) SDOH reviewed interventions complete Readmission risk has been reviewed: (P) Yes Transition of care needs: (P) transition of care needs identified, TOC will continue to follow

## 2023-02-24 NOTE — Plan of Care (Signed)

## 2023-02-24 NOTE — Progress Notes (Signed)
PROGRESS NOTE    Crystal Moore  QMV:[id number] DOB: 1945-12-16 DOA: 02/22/2023 PCP: Lorenda Ishihara, MD    Chief Complaint  Patient presents with   Altered Mental Status    Brief Narrative:  HPI per Dr. Benita Gutter on 02/22/23 Crystal Moore is a 77 y.o. female with medical history significant of Stage IV non-small cell lung cancer on palliative radiation, CVA with left-sided residual deficit, interstitial lung disease with chronic hypoxemia on 2 L, type 2 diabetes, PVD, gastric AVMs, COPD who presents with altered mental status.   Daughter at bedside provides hx as pt has dementia and is oriented only to self.  Aid at home told daughter that pt was not eating today, having tremors. Over the weekend has been more non-verbal. Her oxygen concentrator also ran out. Pt also sometimes get tired of wearing her oxygen and takes it off. She is on 2L O2 at baseline. Pt mostly bed bound or sit in recliner chair throughout the day.  New cough yesterday. No fever. No vomiting or diarrhea. Had wounds on both feet that recently came back after healing up with wound care for the past year.    Pt had last palliative radiation in May and daughter feels her symptoms of cough and sputum production has improved significantly.    In the ED, she had temperature 100.1 F, heart rate of 102, normal blood pressure and on her baseline 2 L.   Mild leukocytosis of 12.7, hemoglobin 8.8 which is down from baseline of 9-10.  Hemoccult is negative however.   No significant electrolyte abnormalities.   CT head negative   CTA chest was negative for acute pulmonary embolism and redemonstrated known multiple bilateral spiculated pulmonary masses.  There is interval mild hazy pulmonary density in the left upper and bilateral lower lobe suggesting superimposed infection or inflammatory process.  There is also a filling defect within the trachea that could represent debris versus possible tracheal mass.   She was given  sepsis protocol fluid and started on broad-spectrum IV antibiotics for community-acquired pneumonia.  Hospitalist consulted for admission.    **Interim History Wound care evaluated and we will obtain a MRI of her ankle given her pain and findings.  Will continue treatment and daughter thinks she is doing better.  SLP to further evaluate and they are recommending dysphagia 3 mechanical soft diet with thin liquids.     Assessment & Plan:   Principal Problem:   Sepsis (HCC) Active Problems:   COPD (chronic obstructive pulmonary disease) (HCC)   ILD (interstitial lung disease) (HCC)   Chronic respiratory failure with hypoxia (HCC)   Diabetes (HCC)   Hypertension   Pressure injury of skin of both feet   Dementia without behavioral disturbance (HCC)   Acute metabolic encephalopathy   History of CVA (cerebrovascular accident)   Tracheal mass   Complicated UTI (urinary tract infection)   Aspiration pneumonia (HCC)   Hypophosphatemia  #1 sepsis secondary to aspiration pneumonia -Patient presented with criteria for sepsis with elevated temperature, tachycardia, and leukocytosis. -CT chest done demonstrating potential debris versus mass in the trachea along with hazy pulmonary density to the upper and lower lobes. -Currently afebrile, leukocytosis trended down. -Clinical improvement. -Was on IV Rocephin and azithromycin transitioned to IV Unasyn. -Saline lock IV fluids. -Continue as needed nebs, Mucinex, flutter valve, incentive spirometry, PPI.  2.??  Tracheal mass versus debris -CT angiogram chest with concerns for debris versus tracheal mass. -Dr. Cyndia Bent discuss findings with daughter and felt likely  it is debris but will wait to show clinical improvement prior to rescanning. -Patient-noted to have a PET scan in March unfortunately had a significant artifact in the neck. -Outpatient follow-up.  3.  History of CVA -With residual left-sided deficit. -Continue Plavix for secondary stroke  prophylaxis. -PT/OT.  4.  Acute metabolic encephalopathy superimposed on chronic dementia -Secondary to infectious etiology from aspiration pneumonia in the setting of dementia. -Improving clinically. -Continue IV antibiotics.  5.  Dementia without behavioral disturbance -Patient noted to be oriented to self usually only.  Per prior MD patient also noted to be mostly bedbound. -Supportive care.  6.  Hypertension -BP soft. -Discontinue Norvasc.  7.  Diabetes mellitus type 2 -Hemoglobin A1c 7.4 (09/22/2022) -CBG noted at 123 this morning. -Noted to be on Lantus 25 units daily prior to admission in addition to Tradjenta. -Continue to hold Lantus. -SS 5.  8.  Hypophosphatemia -Place on oral phosphorus tabs x 3 to 4 days. -Follow.  9.  Interstitial lung disease/chronic hypoxemic respiratory failure -Noted to be on baseline home O2 of 2 L/min. -Being treated for an aspiration pneumonia.  10.  COPD -Stable.  11.  Normocytic anemia -Anemia panel with iron level of 10, TIBC of 186, ferritin of 137, folate of 7.5, vitamin B12 of 398. -Hemoglobin currently stable at 7.3, likely dilutional effect. -Patient with no overt bleeding. -Saline lock IV fluids. -Follow H&H. -Transfusion threshold hemoglobin < 7.  12.  Pressure injury of skin of both feet, POA Pressure Injury 02/23/23 Foot Right;Medial Deep Tissue Pressure Injury - Purple or maroon localized area of discolored intact skin or blood-filled blister due to damage of underlying soft tissue from pressure and/or shear. DTPI evolving to full thickness (Active)  02/23/23 0700  Location: Foot  Location Orientation: Right;Medial  Staging: Deep Tissue Pressure Injury - Purple or maroon localized area of discolored intact skin or blood-filled blister due to damage of underlying soft tissue from pressure and/or shear.  Wound Description (Comments): DTPI evolving to full thickness 3 cm x 3 cm  Present on Admission: Yes     Pressure  Injury 02/23/23 Ankle Left;Lateral Stage 3 -  Full thickness tissue loss. Subcutaneous fat may be visible but bone, tendon or muscle are NOT exposed. L lateral malleolus 4 cm x 2 cm (Active)  02/23/23 0000  Location: Ankle  Location Orientation: Left;Lateral  Staging: Stage 3 -  Full thickness tissue loss. Subcutaneous fat may be visible but bone, tendon or muscle are NOT exposed.  Wound Description (Comments): L lateral malleolus 4 cm x 2 cm  Present on Admission: Yes     Pressure Injury 02/23/23 Hip Left;Lateral Unstageable - Full thickness tissue loss in which the base of the injury is covered by slough (yellow, tan, gray, green or brown) and/or eschar (tan, brown or black) in the wound bed. 2 cm x 2 cm 100% black escha (Active)  02/23/23 0000  Location: Hip  Location Orientation: Left;Lateral  Staging: Unstageable - Full thickness tissue loss in which the base of the injury is covered by slough (yellow, tan, gray, green or brown) and/or eschar (tan, brown or black) in the wound bed.  Wound Description (Comments): 2 cm x 2 cm 100% black eschar  Present on Admission: Yes     Pressure Injury 02/23/23 Knee Left;Lateral 1 cm x 1 cm L lateral knee 100% brown eschar (Active)  02/23/23 0805  Location: Knee  Location Orientation: Left;Lateral  Staging:   Wound Description (Comments): 1 cm x 1 cm  L lateral knee 100% brown eschar  Present on Admission: Yes     Pressure Injury 02/23/23 Heel Medial;Right Stage 2 -  Partial thickness loss of dermis presenting as a shallow open injury with a red, pink wound bed without slough. 3 cm x 2 cm pink dry (Active)  02/23/23   Location: Heel  Location Orientation: Medial;Right  Staging: Stage 2 -  Partial thickness loss of dermis presenting as a shallow open injury with a red, pink wound bed without slough.  Wound Description (Comments): 3 cm x 2 cm pink dry  Present on Admission: Yes     -Repeat hemoglobin A1c.  DVT prophylaxis: Lovenox Code Status:   Family Communication: No family at bedside. Disposition: TBD  Status is: Inpatient Remains inpatient appropriate because: Severity of illness   Consultants:  None  Procedures: Chest x-ray 02/24/2023, 02/23/2023, 02/22/2023 CT angiogram chest 02/22/2023 CT head 02/22/2023 MRI left ankle 02/23/2023   Antimicrobials:  Anti-infectives (From admission, onward)    Start     Dose/Rate Route Frequency Ordered Stop   02/23/23 0000  Ampicillin-Sulbactam (UNASYN) 3 g in sodium chloride 0.9 % 100 mL IVPB        3 g 200 mL/hr over 30 Minutes Intravenous Every 6 hours 02/22/23 2319     02/22/23 2000  cefTRIAXone (ROCEPHIN) 2 g in sodium chloride 0.9 % 100 mL IVPB  Status:  Discontinued        2 g 200 mL/hr over 30 Minutes Intravenous Every 24 hours 02/22/23 1945 02/22/23 2314   02/22/23 2000  azithromycin (ZITHROMAX) 500 mg in sodium chloride 0.9 % 250 mL IVPB  Status:  Discontinued        500 mg 250 mL/hr over 60 Minutes Intravenous Every 24 hours 02/22/23 1945 02/22/23 2314         Subjective: Sitting up in recliner.  Overall feels well.  Denies any chest pain.  No significant shortness of breath.  Tolerating current diet.  Occasional cough noted.  Objective: Vitals:   02/24/23 1559 02/24/23 1928 02/24/23 1936 02/24/23 2046  BP: (!) 132/49 (!) 133/53 122/89 122/71  Pulse: 94 94 97 90  Resp: 17 18 15 17   Temp: 97.7 F (36.5 C) 98.7 F (37.1 C) (!) 97.5 F (36.4 C) 98 F (36.7 C)  TempSrc: Oral Oral Oral Oral  SpO2: 93% 91% 100% 95%  Weight:      Height:        Intake/Output Summary (Last 24 hours) at 02/24/2023 2117 Last data filed at 02/24/2023 1514 Gross per 24 hour  Intake 3041.04 ml  Output 400 ml  Net 2641.04 ml   Filed Weights   02/22/23 1452 02/23/23 1217  Weight: 64 kg 61.3 kg    Examination:  General exam: Appears calm and comfortable  Respiratory system: Coarse bibasilar breath sounds.  No wheezing.  Fair air movement.  Speaking in full sentences.    Cardiovascular system: S1 & S2 heard, RRR. No JVD, murmurs, rubs, gallops or clicks. No pedal edema. Gastrointestinal system: Abdomen is nondistended, soft and nontender. No organomegaly or masses felt. Normal bowel sounds heard. Central nervous system: Alert and oriented. No focal neurological deficits. Extremities: Symmetric 5 x 5 power. Skin: No rashes, lesions or ulcers Psychiatry: Judgement and insight appear poor to fair. Mood & affect appropriate.     Data Reviewed: I have personally reviewed following labs and imaging studies  CBC: Recent Labs  Lab 02/22/23 1800 02/23/23 1013 02/23/23 2357 02/24/23 1144  WBC 12.7* 10.2  10.3 10.5  NEUTROABS 9.9* 8.0* 7.9* 8.3*  HGB 8.8* 7.9* 7.2* 7.3*  HCT 28.7* 25.9* 23.0* 23.1*  MCV 82.0 80.7 79.6* 80.8  PLT 283 261 245 307    Basic Metabolic Panel: Recent Labs  Lab 02/22/23 1800 02/23/23 1013 02/23/23 2357 02/24/23 1144  NA 139 143 141 141  K 3.9 3.6 3.5 3.5  CL 107 111 105 108  CO2 23 26 25 26   GLUCOSE 158* 91 88 141*  BUN 24* 12 10 8   CREATININE 0.95 0.75 0.76 0.73  CALCIUM 8.7* 8.4* 8.3* 8.3*  MG  --  1.9 1.8 1.9  PHOS  --  2.4* 2.4* 2.2*    GFR: Estimated Creatinine Clearance: 51.6 mL/min (by C-G formula based on SCr of 0.73 mg/dL).  Liver Function Tests: Recent Labs  Lab 02/22/23 1800 02/23/23 1013 02/23/23 2357 02/24/23 1144  AST 15 15 13*  --   ALT 18 17 16   --   ALKPHOS 74 58 53  --   BILITOT 0.4 0.4 0.3  --   PROT 7.7 7.0 6.3*  --   ALBUMIN 2.6* 2.3* 2.1* 2.3*    CBG: Recent Labs  Lab 02/23/23 2136 02/24/23 0753 02/24/23 1205 02/24/23 1556 02/24/23 1932  GLUCAP 90 123* 154* 147* 148*     Recent Results (from the past 240 hour(s))  SARS Coronavirus 2 by RT PCR (hospital order, performed in [facility] hospital lab) *cepheid single result test* Anterior Nasal Swab     Status: None   Collection Time: 02/22/23  3:05 PM   Specimen: Anterior Nasal Swab  Result Value Ref Range Status   SARS  Coronavirus 2 by RT PCR NEGATIVE NEGATIVE Final    Comment: Performed at [facility], [address], [place], [place] [address]  Blood Culture (Routine X 2)     Status: None (Preliminary result)   Collection Time: 02/22/23  6:00 PM   Specimen: BLOOD RIGHT WRIST  Result Value Ref Range Status   Specimen Description BLOOD RIGHT WRIST  Final   Special Requests   Final    BOTTLES DRAWN AEROBIC AND ANAEROBIC Blood Culture results may not be optimal due to an inadequate volume of blood received in culture bottles   Culture   Final    NO GROWTH 2 DAYS Performed at [facility], [address] [place], [place] [address]    Report Status PENDING  Incomplete  Blood Culture (Routine X 2)     Status: None (Preliminary result)   Collection Time: 02/22/23  6:10 PM   Specimen: BLOOD LEFT FOREARM  Result Value Ref Range Status   Specimen Description BLOOD LEFT FOREARM  Final   Special Requests   Final    BOTTLES DRAWN AEROBIC AND ANAEROBIC Blood Culture results may not be optimal due to an inadequate volume of blood received in culture bottles   Culture   Final    NO GROWTH 2 DAYS Performed at [facility], [address], [place], [place] [address]    Report Status PENDING  Incomplete         Radiology Studies: DG CHEST PORT 1 VIEW  Result Date: 02/24/2023 CLINICAL DATA:  Shortness of breath.  Sepsis. EXAM: PORTABLE CHEST 1 VIEW COMPARISON:  Radiograph yesterday.  CT 02/22/2023, PET CT 10/13/2022 FINDINGS: Stable heart size and mediastinal contours. Bilateral masslike opacities in the upper lobes. These have been previously characterized. Improving background interstitial opacities. Suspect developing small left pleural effusion. No pneumothorax.  IMPRESSION: 1. Improving diffuse interstitial opacities, may represent improving edema or infection. 2. Bilateral masslike opacities in the upper lobes. These have been previously characterized on CT and PET. 3. Suspect developing  small left pleural effusion. Electronically Signed   By: Narda Rutherford M.D.   On: 02/24/2023 11:30   MR ANKLE LEFT WO CONTRAST  Result Date: 02/23/2023 CLINICAL DATA:  Lateral ankle wound. Bone mass or bone pain, ankle, aggressive features on x-ray. EXAM: MRI OF THE LEFT ANKLE WITHOUT CONTRAST TECHNIQUE: Multiplanar, multisequence MR imaging of the ankle was performed. No intravenous contrast was administered. COMPARISON:  None recent. Left foot radiographs 06/27/2021. Left ankle radiographs 08/09/2020. FINDINGS: Despite efforts by the technologist and patient, moderate to severe motion artifact is present on today's exam and could not be eliminated. This reduces exam sensitivity and specificity. Patient was unable to complete the examination. No postcontrast imaging obtained. Bones/Joint/Cartilage No evidence of acute fracture, dislocation or bone destruction. No significant joint effusions or arthropathic changes identified. No aggressive osseous lesions are seen. Ligaments Ligamentous assessment limited by motion. No ligamentous abnormalities are identified. Muscles and Tendons The medial flexor, anterior extensor, peroneal and Achilles tendons appear intact. No evidence of significant tenosynovitis or focal muscular abnormality. Generalized muscular atrophy noted. The plantar fascia is intact. Soft tissue Soft tissue assessment limited by motion. There appears to be mild subcutaneous edema lateral to the distal fibula and in the lateral hindfoot, although no focal fluid collection or discrete soft tissue ulcer identified. IMPRESSION: 1. Significantly motion degraded examination. 2. No evidence of osteomyelitis, septic arthritis or soft tissue abscess. 3. Mild nonspecific subcutaneous edema lateral to the distal fibula and in the lateral hindfoot. 4. If further evaluation of suspected soft tissue infection is warranted clinically, consider CT with contrast which may be better tolerated by the patient.  Radiographic correlation recommended regarding the possible bone lesion in the provided history. Electronically Signed   By: Carey Bullocks M.D.   On: 02/23/2023 15:59   DG CHEST PORT 1 VIEW  Result Date: 02/23/2023 CLINICAL DATA:  Shortness of breath EXAM: PORTABLE CHEST 1 VIEW COMPARISON:  X-ray and CT angiogram 02/22/2023 FINDINGS: Overlapping cardiac leads. Stable cardiopericardial silhouette with calcified aorta. Film is less well inflated today compared to prior but there appears to be increasing interstitial and hazy lung opacities bilaterally. Mass lesions are again seen in the upper lung zones. No pneumothorax or effusion. IMPRESSION: Worsening inflation with increasing interstitial and hazy lung opacities. Recommend follow up Electronically Signed   By: Karen Kays M.D.   On: 02/23/2023 10:38        Scheduled Meds:  atorvastatin  80 mg Oral QPM   clopidogrel  75 mg Oral Daily   enoxaparin (LOVENOX) injection  40 mg Subcutaneous QHS   feeding supplement  237 mL Oral BID BM   folic acid  1 mg Oral Daily   gabapentin  300 mg Oral TID   guaiFENesin  1,200 mg Oral BID   insulin aspart  0-9 Units Subcutaneous TID WC   leptospermum manuka honey  1 Application Topical Daily   pantoprazole  40 mg Oral BID   phosphorus  250 mg Oral BID   Continuous Infusions:  ampicillin-sulbactam (UNASYN) IV 3 g (02/24/23 1901)     LOS: 2 days    Time spent: 40 minutes    Ramiro Harvest, MD [facility]   To contact the attending provider between 7A-7P or the covering provider during after hours 7P-7A, please log into the  web site [email or web address] and access using universal [place] password for that web site. If you do not have the password, please call the hospital operator.  02/24/2023, 9:17 PM

## 2023-02-24 NOTE — Evaluation (Signed)
Occupational Therapy Evaluation Patient Details Name: Crystal Moore MRN: [id number] DOB: 03-18-1946 Today's Date: 02/24/2023   History of Present Illness 77 y.o. female adm 7/29 with AMS, sepsis. PMhx: NSCLC on palliative radiation, CVA with left-sided residual deficit, ILD on 2L, T2DM, PVD, gastric AVMs, COPD, dementia   Clinical Impression   PTA, pt lived with daughter who assisted with all ADL. Pt able to feed herself and help with self bathing at baseline. Upon eval, pt able to wash face at EOB with supervision A. Pt with some awareness of residual L deficits from prior CVA. Able to verbally express name and communicate yes/no during session. Following one step commands with increased time and oriented to self. Requiring heavy +2 assist to transfer from bed to chair. Pt with decreased balance, strength, LE ROM, LUE functional use, activity tolerance, and cognition. Daughter reports plan is to go home with assist from family. Pending family ability to assist at home, recommending discharge home with [facility] for initial home safety eval.      Recommendations for follow up therapy are one component of a multi-disciplinary discharge planning process, led by the attending physician.  Recommendations may be updated based on patient status, additional functional criteria and insurance authorization.   Assistance Recommended at Discharge Frequent or constant Supervision/Assistance (24/7)  Patient can return home with the following Two people to help with walking and/or transfers;Two people to help with bathing/dressing/bathroom;Assistance with cooking/housework;Assist for transportation;Help with stairs or ramp for entrance;Direct supervision/assist for medications management;Direct supervision/assist for financial management    Functional Status Assessment  Patient has had a recent decline in their functional status and demonstrates the ability to make significant improvements in function in a  reasonable and predictable amount of time.  Equipment Recommendations  None recommended by OT    Recommendations for Other Services       Precautions / Restrictions Precautions Precautions: Fall;Other (comment) Precaution Comments: delirium precautions pt with hx of dementia      Mobility Bed Mobility Overal bed mobility: Needs Assistance Bed Mobility: Supine to Sit     Supine to sit: Max assist     General bed mobility comments: Heavy assist and prep (LE ROM) to bring BLE to EOB and boost with bed pad. Light assist for truncal elevation as well.    Transfers Overall transfer level: Needs assistance Equipment used: 2 person hand held assist Transfers: Sit to/from Stand, Bed to chair/wheelchair/BSC Sit to Stand: Total assist, +2 physical assistance, +2 safety/equipment          Lateral/Scoot Transfers: Total assist, +2 physical assistance, +2 safety/equipment General transfer comment: Heavy assist at gait belt and bed pad to lift bottom from EOB; pt in significantly stooped position and unable to achieve upright standing. Lateral scoot to chair face to face transfer and +2 from back for bed pad boost.      Balance Overall balance assessment: Needs assistance Sitting-balance support: No upper extremity supported, Single extremity supported, Feet supported Sitting balance-Leahy Scale: Fair Sitting balance - Comments: supervision statically   Standing balance support: Bilateral upper extremity supported, During functional activity Standing balance-Leahy Scale: Zero Standing balance comment: total A +2                           ADL either performed or assessed with clinical judgement   ADL Overall ADL's : Needs assistance/impaired Eating/Feeding: Set up;Sitting   Grooming: Wash/dry face;Set up;Min guard;Sitting Grooming Details (indicate cue type and reason):  at EOB; cues for thoroughness Upper Body Bathing: Moderate assistance;Sitting   Lower Body  Bathing: Total assistance;Sitting/lateral leans;Sit to/from stand   Upper Body Dressing : Sitting;Maximal assistance   Lower Body Dressing: Total assistance;Sit to/from stand   Toilet Transfer: Total assistance;+2 for physical assistance;+2 for safety/equipment;Squat-pivot   Toileting- Clothing Manipulation and Hygiene: Total assistance;Bed level       Functional mobility during ADLs: Total assistance;+2 for safety/equipment;+2 for physical assistance       Vision Patient Visual Report:  (Pt unable to report) Additional Comments: Pt with good eye contact throughout session. Pt appeared to have difficulty reading board, but able to locate it as well as TV. Needing assist to manipulate TV remote     Perception     Praxis      Pertinent Vitals/Pain Pain Assessment Pain Assessment: Faces Faces Pain Scale: Hurts even more Pain Location: LE wilth ROM Pain Descriptors / Indicators: Aching, Sore Pain Intervention(s): Limited activity within patient's tolerance, Monitored during session     Hand Dominance     Extremity/Trunk Assessment Upper Extremity Assessment Upper Extremity Assessment: LUE deficits/detail;RUE deficits/detail RUE Deficits / Details: gneralized weakness. Grossly 4/5 LUE Deficits / Details: No AROM noted. Pt with LUE elbow flexion at rest.   Lower Extremity Assessment Lower Extremity Assessment: Defer to PT evaluation   Cervical / Trunk Assessment Cervical / Trunk Assessment: Kyphotic   Communication Communication Communication: Expressive difficulties (Able to verbalize name during session and otherwise, able to verbalize yes/no with "uh huh and nuh uh")   Cognition Arousal/Alertness: Awake/alert (lethargic on arrival, but aroused and able to [facility] with time) Behavior During Therapy: [facility] for tasks assessed/performed Overall Cognitive Status: History of cognitive impairments - at baseline                                 General  Comments: Pt oriented to self at baseline per chart, and pt able to verbalize name. Unable to state location, month, or year, but did appear to attempt to reference board when asked what month is was. Pt able to follow one step commands with incresaed time. Some awareness of L sided deficits as pt picking up LUE with RUE when asked to lift her L arm.     General Comments  Spoke with daughetr on phone to obtain history    Exercises     Shoulder Instructions      Home Living Family/patient expects to be discharged to:: Private residence Living Arrangements: Children   Type of Home: House Home Access: Stairs to enter Secretary/administrator of Steps: 1 Entrance Stairs-Rails: None Home Layout: Two level     Bathroom Shower/Tub: Chief Strategy Officer: Standard     Home Equipment: Cane - single point;Wheelchair - manual;Hand held shower head;Tub bench;BSC/3in1;Cane - quad;Grab bars - tub/shower;Transport chair;Hospital bed          Prior Functioning/Environment Prior Level of Function : Needs assist  Cognitive Assist : Mobility (cognitive);ADLs (cognitive) Mobility (Cognitive): Step by step cues ADLs (Cognitive): Step by step cues Physical Assist : Mobility (physical);ADLs (physical) Mobility (physical): Bed mobility;Transfers ADLs (physical): Grooming;Bathing;Dressing;Toileting;IADLs Mobility Comments: since having wounds unable to walk ADLs Comments: feeds self, adssists with bathing        OT Problem List: Decreased strength;Decreased activity tolerance;Impaired balance (sitting and/or standing);Decreased coordination;Decreased cognition;Decreased safety awareness;Impaired UE functional use      OT Treatment/Interventions: Self-care/ADL training;Therapeutic exercise;DME and/or AE instruction;Balance  training;Patient/family education;Therapeutic activities;Cognitive remediation/compensation    OT Goals(Current goals can be found in the care plan section) Acute  Rehab OT Goals Patient Stated Goal: unable OT Goal Formulation: With patient Time For Goal Achievement: 03/10/23 Potential to Achieve Goals: Fair  OT Frequency: Min 1X/week    Co-evaluation              AM-PAC OT "6 Clicks" Daily Activity     Outcome Measure Help from another person eating meals?: A Little Help from another person taking care of personal grooming?: A Little Help from another person toileting, which includes using toliet, bedpan, or urinal?: Total Help from another person bathing (including washing, rinsing, drying)?: A Lot Help from another person to put on and taking off regular upper body clothing?: A Lot Help from another person to put on and taking off regular lower body clothing?: Total 6 Click Score: 12   End of Session Equipment Utilized During Treatment: Gait belt Nurse Communication: Mobility status;Other (comment) (transfers, pt in chair)  Activity Tolerance: Patient tolerated treatment well Patient left: in chair;with call bell/phone within reach;with chair alarm set  OT Visit Diagnosis: Unsteadiness on feet (R26.81);Muscle weakness (generalized) (M62.81);Other symptoms and signs involving cognitive function;Other abnormalities of gait and mobility (R26.89);Cognitive communication deficit (R41.841)                Time: 9147-8295 OT Time Calculation (min): 20 min Charges:  OT General Charges $OT Visit: 1 Visit OT Evaluation $OT Eval Moderate Complexity: 1 Mod  Tyler Deis, OTR/L [facility] Acute Rehabilitation Office: [phone]   Crystal Moore 02/24/2023, 10:23 AM

## 2023-02-24 NOTE — Evaluation (Signed)
Physical Therapy Evaluation Patient Details Name: Crystal Moore MRN: [id number] DOB: [birth year] Today's Date: 02/24/2023  History of Present Illness  77 y.o. female adm 7/29 with AMS, sepsis. PMhx: NSCLC on palliative radiation, CVA with left-sided residual deficit, ILD on 2L, T2DM, PVD, gastric AVMs, COPD, dementia  Clinical Impression  Pt pleasant, smiling and nodding during session and able to state her name. Pt reports walking at home but per daughter has not been walking recently with assist to transfer to chair. Pt able to perform sitting balance and assist with moving LB. Pt with decreased transfers and functional mobility who will benefit from trial of acute therapy to progress transfers and ROM. Per daughter she is not interested in SNF and wants pt to return home. Michiel Sites would be helpful if pt unable to progress with transfers.       If plan is discharge home, recommend the following: A lot of help with walking and/or transfers;A lot of help with bathing/dressing/bathroom;Assistance with cooking/housework;Assistance with feeding;Direct supervision/assist for financial management;Assist for transportation;Help with stairs or ramp for entrance;Direct supervision/assist for medications management   Can travel by private vehicle        Equipment Recommendations Other (comment) (hoyer)  Recommendations for Other Services       Functional Status Assessment Patient has had a recent decline in their functional status and/or demonstrates limited ability to make significant improvements in function in a reasonable and predictable amount of time     Precautions / Restrictions Precautions Precautions: Fall Precaution Comments: incontinent      Mobility  Bed Mobility Overal bed mobility: Needs Assistance Bed Mobility: Supine to Sit     Supine to sit: Max assist     General bed mobility comments: Heavy assist and prep (LE ROM) to bring BLE to EOB and boost with bed pad. Light  assist for truncal elevation as well. max assist to scoot to EOB    Transfers Overall transfer level: Needs assistance   Transfers: Sit to/from Stand, Bed to chair/wheelchair/BSC Sit to Stand: Total assist, +2 physical assistance, +2 safety/equipment          Lateral/Scoot Transfers: Total assist, +2 physical assistance, +2 safety/equipment General transfer comment: Heavy assist at gait belt and bed pad to lift bottom from EOB; pt in significantly stooped position and unable to achieve upright standing despite blocking bil knees. Lateral scoot to chair face to face transfer and +2 from back for bed pad boost.    Ambulation/Gait                  Stairs            Wheelchair Mobility     Tilt Bed    Modified Rankin (Stroke Patients Only)       Balance Overall balance assessment: Needs assistance Sitting-balance support: No upper extremity supported, Single extremity supported, Feet supported Sitting balance-Leahy Scale: Fair Sitting balance - Comments: supervision statically                                     Pertinent Vitals/Pain Pain Assessment Pain Assessment: Faces Pain Score: 3  Pain Location: grimace with bil LE ROM Pain Intervention(s): Limited activity within patient's tolerance, Repositioned, Monitored during session    Home Living Family/patient expects to be discharged to:: Private residence Living Arrangements: Children Available Help at Discharge: Family;Home health;Available 24 hours/day Type of Home: House Home Access: Stairs to  enter Entrance Stairs-Rails: None Entrance Stairs-Number of Steps: 1   Home Layout: Two level;Able to live on main level with bedroom/bathroom Home Equipment: Gilmer Mor - single point;Wheelchair - manual;Hand held shower head;Tub bench;BSC/3in1;Cane - quad;Grab bars - tub/shower;Transport chair;Hospital bed      Prior Function Prior Level of Function : Needs assist  Cognitive Assist : Mobility  (cognitive);ADLs (cognitive) Mobility (Cognitive): Step by step cues ADLs (Cognitive): Step by step cues Physical Assist : Mobility (physical);ADLs (physical) Mobility (physical): Bed mobility;Transfers ADLs (physical): Grooming;Bathing;Dressing;Toileting;IADLs Mobility Comments: since having wounds unable to walk, assist for transfers to chair ADLs Comments: self feeds self, bathing assist     Hand Dominance        Extremity/Trunk Assessment   Upper Extremity Assessment Upper Extremity Assessment: Defer to OT evaluation RUE Deficits / Details: gneralized weakness. Grossly 4/5 LUE Deficits / Details: No AROM noted. Pt with LUE elbow flexion at rest.    Lower Extremity Assessment Lower Extremity Assessment: RLE deficits/detail;LLE deficits/detail RLE Deficits / Details: grossly 10 degree knee flexion contracture, pt with 2-/5 strength AROM LLE Deficits / Details: grossly 10 degree knee flexion contracture, pt with 2-/5 strength AROM    Cervical / Trunk Assessment Cervical / Trunk Assessment: Kyphotic  Communication   Communication: Expressive difficulties (Able to verbalize name during session and otherwise, able to verbalize yes/no with "uh huh and nuh uh")  Cognition Arousal/Alertness: Awake/alert Behavior During Therapy: Flat affect Overall Cognitive Status: History of cognitive impairments - at baseline                                 General Comments: Pt oriented to self at baseline per chart, and pt able to verbalize name. Unable to state location, month, or year, but did appear to attempt to reference board when asked what month it was. Pt able to follow one step commands with increased time. Some awareness of L sided deficits as pt picking up LUE with RUE when asked to lift her LUE. pt nodding she can walk which was not true.        General Comments General comments (skin integrity, edema, etc.): Spoke with daughetr on phone to obtain history     Exercises General Exercises - Lower Extremity Short Arc Quad: AAROM, Both, 5 reps   Assessment/Plan    PT Assessment Patient needs continued PT services  PT Problem List Decreased strength;Decreased range of motion;Decreased activity tolerance;Decreased balance;Decreased mobility;Decreased safety awareness;Decreased knowledge of use of DME;Decreased cognition       PT Treatment Interventions Functional mobility training;Therapeutic activities;Patient/family education;Cognitive remediation;Balance training;Therapeutic exercise    PT Goals (Current goals can be found in the Care Plan section)  Acute Rehab PT Goals Patient Stated Goal: return home PT Goal Formulation: With family Time For Goal Achievement: 03/10/23 Potential to Achieve Goals: Fair    Frequency Min 1X/week     Co-evaluation PT/OT/SLP Co-Evaluation/Treatment: Yes Reason for Co-Treatment: Complexity of the patient's impairments (multi-system involvement) PT goals addressed during session: Mobility/safety with mobility;Balance         AM-PAC PT "6 Clicks" Mobility  Outcome Measure Help needed turning from your back to your side while in a flat bed without using bedrails?: A Lot Help needed moving from lying on your back to sitting on the side of a flat bed without using bedrails?: A Lot Help needed moving to and from a bed to a chair (including a wheelchair)?: Total Help needed standing  up from a chair using your arms (e.g., wheelchair or bedside chair)?: Total Help needed to walk in hospital room?: Total Help needed climbing 3-5 steps with a railing? : Total 6 Click Score: 8    End of Session Equipment Utilized During Treatment: Gait belt Activity Tolerance: Patient tolerated treatment well Patient left: in chair;with call bell/phone within reach;with chair alarm set Nurse Communication: Mobility status;Need for lift equipment (+2 lateral scoot with drop arm) PT Visit Diagnosis: Other abnormalities of gait  and mobility (R26.89);Muscle weakness (generalized) (M62.81)    Time: 0960-4540 PT Time Calculation (min) (ACUTE ONLY): 17 min   Charges:   PT Evaluation $PT Eval Moderate Complexity: 1 Mod   PT General Charges $$ ACUTE PT VISIT: 1 Visit         Merryl Hacker, PT Acute Rehabilitation Services Office: [phone]   Crystal Moore 02/24/2023, 10:38 AM

## 2023-02-25 DIAGNOSIS — A419 Sepsis, unspecified organism: Secondary | ICD-10-CM | POA: Diagnosis not present

## 2023-02-25 DIAGNOSIS — G9341 Metabolic encephalopathy: Secondary | ICD-10-CM | POA: Diagnosis not present

## 2023-02-25 DIAGNOSIS — L89899 Pressure ulcer of other site, unspecified stage: Secondary | ICD-10-CM | POA: Diagnosis not present

## 2023-02-25 DIAGNOSIS — F0390 Unspecified dementia without behavioral disturbance: Secondary | ICD-10-CM | POA: Diagnosis not present

## 2023-02-25 LAB — GLUCOSE, CAPILLARY
Glucose-Capillary: 134 mg/dL — ABNORMAL HIGH (ref 70–99)
Glucose-Capillary: 204 mg/dL — ABNORMAL HIGH (ref 70–99)
Glucose-Capillary: 163 mg/dL — ABNORMAL HIGH (ref 70–99)
Glucose-Capillary: 120 mg/dL — ABNORMAL HIGH (ref 70–99)

## 2023-02-25 LAB — HEMOGLOBIN AND HEMATOCRIT, BLOOD
HCT: 26 % — ABNORMAL LOW (ref 36.0–46.0)
Hemoglobin: 8.1 g/dL — ABNORMAL LOW (ref 12.0–15.0)

## 2023-02-25 MED ORDER — POLYSACCHARIDE IRON COMPLEX 150 MG PO CAPS
150.0000 mg | ORAL_CAPSULE | Freq: Every day | ORAL | Status: DC
  Administered 2023-02-25 – 2023-02-27 (×3): 150 mg via ORAL
  Filled 2023-02-25 (×3): qty 1

## 2023-02-25 MED ORDER — MAGNESIUM SULFATE 2 GM/50ML IV SOLN
2.0000 g | Freq: Once | INTRAVENOUS | Status: AC
  Administered 2023-02-25: 2 g via INTRAVENOUS
  Filled 2023-02-25: qty 50

## 2023-02-25 NOTE — Progress Notes (Addendum)
PROGRESS NOTE    Crystal Moore  UXL:[id number] DOB: [birth year] DOA: 02/22/2023 PCP: Lorenda Ishihara, MD    Chief Complaint  Patient presents with   Altered Mental Status    Brief Narrative:  HPI per Dr. Benita Gutter on 02/22/23 Crystal Moore is a 77 y.o. female with medical history significant of Stage IV non-small cell lung cancer on palliative radiation, CVA with left-sided residual deficit, interstitial lung disease with chronic hypoxemia on 2 L, type 2 diabetes, PVD, gastric AVMs, COPD who presents with altered mental status.   Daughter at bedside provides hx as pt has dementia and is oriented only to self.  Aid at home told daughter that pt was not eating today, having tremors. Over the weekend has been more non-verbal. Her oxygen concentrator also ran out. Pt also sometimes get tired of wearing her oxygen and takes it off. She is on 2L O2 at baseline. Pt mostly bed bound or sit in recliner chair throughout the day.  New cough yesterday. No fever. No vomiting or diarrhea. Had wounds on both feet that recently came back after healing up with wound care for the past year.    Pt had last palliative radiation in May and daughter feels her symptoms of cough and sputum production has improved significantly.    In the ED, she had temperature 100.1 F, heart rate of 102, normal blood pressure and on her baseline 2 L.   Mild leukocytosis of 12.7, hemoglobin 8.8 which is down from baseline of 9-10.  Hemoccult is negative however.   No significant electrolyte abnormalities.   CT head negative   CTA chest was negative for acute pulmonary embolism and redemonstrated known multiple bilateral spiculated pulmonary masses.  There is interval mild hazy pulmonary density in the left upper and bilateral lower lobe suggesting superimposed infection or inflammatory process.  There is also a filling defect within the trachea that could represent debris versus possible tracheal mass.   She was given  sepsis protocol fluid and started on broad-spectrum IV antibiotics for community-acquired pneumonia.  Hospitalist consulted for admission.    **Interim History Wound care evaluated and we will obtain a MRI of her ankle given her pain and findings.  Will continue treatment and daughter thinks she is doing better.  SLP to further evaluate and they are recommending dysphagia 3 mechanical soft diet with thin liquids.     Assessment & Plan:   Principal Problem:   Sepsis (HCC) Active Problems:   COPD (chronic obstructive pulmonary disease) (HCC)   ILD (interstitial lung disease) (HCC)   Chronic respiratory failure with hypoxia (HCC)   Diabetes (HCC)   Hypertension   Pressure injury of skin of both feet   Dementia without behavioral disturbance (HCC)   Acute metabolic encephalopathy   History of CVA (cerebrovascular accident)   Tracheal mass   Complicated UTI (urinary tract infection)   Aspiration pneumonia (HCC)   Hypophosphatemia  #1 sepsis secondary to aspiration pneumonia -Patient presented with criteria for sepsis with elevated temperature, tachycardia, and leukocytosis. -CT chest done demonstrating potential debris versus mass in the trachea along with hazy pulmonary density to the upper and lower lobes. -Currently afebrile, leukocytosis trended down. -Improving clinically. -Was on IV Rocephin and azithromycin transitioned to IV Unasyn. -Saline lock IV fluids. -Continue as needed nebs, Mucinex, flutter valve, incentive spirometry, PPI. -If continued improvement could likely transition from IV antibiotics to oral antibiotics in the next 1 to 2 days.  2.??  Tracheal mass versus debris -  CT angiogram chest with concerns for debris versus tracheal mass. -Dr. Cyndia Bent discuss findings with daughter and felt likely it is debris but will wait to show clinical improvement prior to rescanning. -Patient-noted to have a PET scan in March unfortunately had a significant artifact in the  neck. -Outpatient follow-up.  3.  History of CVA -With residual left-sided deficit. -Continue Plavix for secondary stroke prophylaxis. -PT/OT.  4.  Acute metabolic encephalopathy superimposed on chronic dementia -Secondary to infectious etiology from aspiration pneumonia in the setting of dementia. -Clinical improvement. -Continue current antibiotics.  5.  Dementia without behavioral disturbance -Patient noted to be oriented to self usually only.  Per prior MD patient also noted to be mostly bedbound. -Supportive care.  6.  Hypertension -BP soft yesterday and as such Norvasc discontinued. -Continue to hold Norvasc..  7.  Diabetes mellitus type 2 -Hemoglobin A1c 7.4 (09/22/2022) -CBG noted at 134 this morning. -Noted to be on Lantus 25 units daily prior to admission in addition to Tradjenta. -Continue to hold Lantus and Tradjenta.   -SSI.    8.  Hypophosphatemia -Continue oral phosphorus tabs. -Follow.  9.  Interstitial lung disease/chronic hypoxemic respiratory failure -Noted to be on baseline home O2 of 2 L/min. -Being treated for an aspiration pneumonia.  10.  COPD -Stable.  11.  Normocytic anemia -Anemia panel with iron level of 10, TIBC of 186, ferritin of 137, folate of 7.5, vitamin B12 of 398. -Hemoglobin currently stable at 7.0, this morning, likely dilutional effect. -Repeat H&H done at 8.1. -Patient with no overt bleeding. -Saline lock IV fluids. -Follow H&H. -Transfusion threshold hemoglobin < 7.  12.  Pressure injury of skin of both feet, POA Pressure Injury 02/23/23 Foot Right;Medial Deep Tissue Pressure Injury - Purple or maroon localized area of discolored intact skin or blood-filled blister due to damage of underlying soft tissue from pressure and/or shear. DTPI evolving to full thickness (Active)  02/23/23 0700  Location: Foot  Location Orientation: Right;Medial  Staging: Deep Tissue Pressure Injury - Purple or maroon localized area of discolored  intact skin or blood-filled blister due to damage of underlying soft tissue from pressure and/or shear.  Wound Description (Comments): DTPI evolving to full thickness 3 cm x 3 cm  Present on Admission: Yes     Pressure Injury 02/23/23 Ankle Left;Lateral Stage 3 -  Full thickness tissue loss. Subcutaneous fat may be visible but bone, tendon or muscle are NOT exposed. L lateral malleolus 4 cm x 2 cm (Active)  02/23/23 0000  Location: Ankle  Location Orientation: Left;Lateral  Staging: Stage 3 -  Full thickness tissue loss. Subcutaneous fat may be visible but bone, tendon or muscle are NOT exposed.  Wound Description (Comments): L lateral malleolus 4 cm x 2 cm  Present on Admission: Yes     Pressure Injury 02/23/23 Hip Left;Lateral Unstageable - Full thickness tissue loss in which the base of the injury is covered by slough (yellow, tan, gray, green or brown) and/or eschar (tan, brown or black) in the wound bed. 2 cm x 2 cm 100% black escha (Active)  02/23/23 0000  Location: Hip  Location Orientation: Left;Lateral  Staging: Unstageable - Full thickness tissue loss in which the base of the injury is covered by slough (yellow, tan, gray, green or brown) and/or eschar (tan, brown or black) in the wound bed.  Wound Description (Comments): 2 cm x 2 cm 100% black eschar  Present on Admission: Yes     Pressure Injury 02/23/23 Knee Left;Lateral 1  cm x 1 cm L lateral knee 100% brown eschar (Active)  02/23/23 0805  Location: Knee  Location Orientation: Left;Lateral  Staging:   Wound Description (Comments): 1 cm x 1 cm L lateral knee 100% brown eschar  Present on Admission: Yes     Pressure Injury 02/23/23 Heel Medial;Right Stage 2 -  Partial thickness loss of dermis presenting as a shallow open injury with a red, pink wound bed without slough. 3 cm x 2 cm pink dry (Active)  02/23/23   Location: Heel  Location Orientation: Medial;Right  Staging: Stage 2 -  Partial thickness loss of dermis  presenting as a shallow open injury with a red, pink wound bed without slough.  Wound Description (Comments): 3 cm x 2 cm pink dry  Present on Admission: Yes     -Repeat hemoglobin A1c.  DVT prophylaxis: Lovenox Code Status:  Family Communication: Updated daughter at bedside.  Disposition: Home with home health when clinically stable and improved and on oral antibiotics hopefully in the next 1 to 2 days.  Status is: Inpatient Remains inpatient appropriate because: Severity of illness   Consultants:  None  Procedures: Chest x-ray 02/24/2023, 02/23/2023, 02/22/2023 CT angiogram chest 02/22/2023 CT head 02/22/2023 MRI left ankle 02/23/2023   Antimicrobials:  Anti-infectives (From admission, onward)    Start     Dose/Rate Route Frequency Ordered Stop   02/23/23 0000  Ampicillin-Sulbactam (UNASYN) 3 g in sodium chloride 0.9 % 100 mL IVPB        3 g 200 mL/hr over 30 Minutes Intravenous Every 6 hours 02/22/23 2319     02/22/23 2000  cefTRIAXone (ROCEPHIN) 2 g in sodium chloride 0.9 % 100 mL IVPB  Status:  Discontinued        2 g 200 mL/hr over 30 Minutes Intravenous Every 24 hours 02/22/23 1945 02/22/23 2314   02/22/23 2000  azithromycin (ZITHROMAX) 500 mg in sodium chloride 0.9 % 250 mL IVPB  Status:  Discontinued        500 mg 250 mL/hr over 60 Minutes Intravenous Every 24 hours 02/22/23 1945 02/22/23 2314         Subjective: Laying in bed.  Denies any chest pain.  Feels shortness of breath is improving.  Still with a cough.  No abdominal pain.  Overall feeling better.   Objective: Vitals:   02/24/23 1936 02/24/23 2046 02/25/23 0430 02/25/23 0718  BP: 122/89 122/71 (!) 134/55 136/72  Pulse: 97 90 86 95  Resp: 15 17 17 16   Temp: (!) 97.5 F (36.4 C) 98 F (36.7 C) 98 F (36.7 C) 98.2 F (36.8 C)  TempSrc: Oral Oral Oral Oral  SpO2: 100% 95% 100% 94%  Weight:      Height:        Intake/Output Summary (Last 24 hours) at 02/25/2023 1206 Last data filed at 02/25/2023  0850 Gross per 24 hour  Intake 537 ml  Output 550 ml  Net -13 ml   Filed Weights   02/22/23 1452 02/23/23 1217  Weight: 64 kg 61.3 kg    Examination:  General exam: NAD. Respiratory system: Bibasilar coarse breath sounds.  No wheezing.  Fair air movement.  Speaking in full sentences.  Cardiovascular system: RRR no murmurs rubs or gallops.  No JVD.  No lower extremity edema.  Gastrointestinal system: Abdomen is soft, nontender, nondistended, positive bowel sounds.  No rebound.  No guarding.  Central nervous system: Alert. No focal neurological deficits. Extremities: Symmetric 5 x 5 power. Skin: No rashes,  lesions or ulcers Psychiatry: Judgement and insight appear poor to fair. Mood & affect appropriate.     Data Reviewed: I have personally reviewed following labs and imaging studies  CBC: Recent Labs  Lab 02/22/23 1800 02/23/23 1013 02/23/23 2357 02/24/23 1144 02/24/23 2319 02/25/23 0947  WBC 12.7* 10.2 10.3 10.5 8.5  --   NEUTROABS 9.9* 8.0* 7.9* 8.3* 6.5  --   HGB 8.8* 7.9* 7.2* 7.3* 7.0* 8.1*  HCT 28.7* 25.9* 23.0* 23.1* 22.4* 26.0*  MCV 82.0 80.7 79.6* 80.8 79.2*  --   PLT 283 261 245 307 241  --     Basic Metabolic Panel: Recent Labs  Lab 02/22/23 1800 02/23/23 1013 02/23/23 2357 02/24/23 1144 02/24/23 2319  NA 139 143 141 141 139  K 3.9 3.6 3.5 3.5 3.8  CL 107 111 105 108 107  CO2 23 26 25 26 22   GLUCOSE 158* 91 88 141* 144*  BUN 24* 12 10 8 10   CREATININE 0.95 0.75 0.76 0.73 0.74  CALCIUM 8.7* 8.4* 8.3* 8.3* 7.9*  MG  --  1.9 1.8 1.9 1.8  PHOS  --  2.4* 2.4* 2.2* 2.5    GFR: Estimated Creatinine Clearance: 51.6 mL/min (by C-G formula based on SCr of 0.74 mg/dL).  Liver Function Tests: Recent Labs  Lab 02/22/23 1800 02/23/23 1013 02/23/23 2357 02/24/23 1144 02/24/23 2319  AST 15 15 13*  --   --   ALT 18 17 16   --   --   ALKPHOS 74 58 53  --   --   BILITOT 0.4 0.4 0.3  --   --   PROT 7.7 7.0 6.3*  --   --   ALBUMIN 2.6* 2.3* 2.1* 2.3*  1.7*    CBG: Recent Labs  Lab 02/24/23 1205 02/24/23 1556 02/24/23 1932 02/24/23 2314 02/25/23 0825  GLUCAP 154* 147* 148* 132* 134*     Recent Results (from the past 240 hour(s))  SARS Coronavirus 2 by RT PCR (hospital order, performed in [facility] hospital lab) *cepheid single result test* Anterior Nasal Swab     Status: None   Collection Time: 02/22/23  3:05 PM   Specimen: Anterior Nasal Swab  Result Value Ref Range Status   SARS Coronavirus 2 by RT PCR NEGATIVE NEGATIVE Final    Comment: Performed at [facility], [address] [place], [place] [address]  Blood Culture (Routine X 2)     Status: None (Preliminary result)   Collection Time: 02/22/23  6:00 PM   Specimen: BLOOD RIGHT WRIST  Result Value Ref Range Status   Specimen Description BLOOD RIGHT WRIST  Final   Special Requests   Final    BOTTLES DRAWN AEROBIC AND ANAEROBIC Blood Culture results may not be optimal due to an inadequate volume of blood received in culture bottles   Culture   Final    NO GROWTH 3 DAYS Performed at [facility], [address] [place], [place] [address]    Report Status PENDING  Incomplete  Blood Culture (Routine X 2)     Status: None (Preliminary result)   Collection Time: 02/22/23  6:10 PM   Specimen: BLOOD LEFT FOREARM  Result Value Ref Range Status   Specimen Description BLOOD LEFT FOREARM  Final   Special Requests   Final    BOTTLES DRAWN AEROBIC AND ANAEROBIC Blood Culture results may not be optimal due to an inadequate volume of blood received in culture bottles   Culture  Final    NO GROWTH 3 DAYS Performed at [facility], [address] [place], [place] [address]    Report Status PENDING  Incomplete         Radiology Studies: DG CHEST PORT 1 VIEW  Result Date: 02/24/2023 CLINICAL DATA:  Shortness of breath.  Sepsis. EXAM: PORTABLE CHEST 1 VIEW COMPARISON:  Radiograph yesterday.  CT 02/22/2023, PET CT 10/13/2022 FINDINGS: Stable heart size  and mediastinal contours. Bilateral masslike opacities in the upper lobes. These have been previously characterized. Improving background interstitial opacities. Suspect developing small left pleural effusion. No pneumothorax. IMPRESSION: 1. Improving diffuse interstitial opacities, may represent improving edema or infection. 2. Bilateral masslike opacities in the upper lobes. These have been previously characterized on CT and PET. 3. Suspect developing small left pleural effusion. Electronically Signed   By: Narda Rutherford M.D.   On: 02/24/2023 11:30   MR ANKLE LEFT WO CONTRAST  Result Date: 02/23/2023 CLINICAL DATA:  Lateral ankle wound. Bone mass or bone pain, ankle, aggressive features on x-ray. EXAM: MRI OF THE LEFT ANKLE WITHOUT CONTRAST TECHNIQUE: Multiplanar, multisequence MR imaging of the ankle was performed. No intravenous contrast was administered. COMPARISON:  None recent. Left foot radiographs 06/27/2021. Left ankle radiographs 08/09/2020. FINDINGS: Despite efforts by the technologist and patient, moderate to severe motion artifact is present on today's exam and could not be eliminated. This reduces exam sensitivity and specificity. Patient was unable to complete the examination. No postcontrast imaging obtained. Bones/Joint/Cartilage No evidence of acute fracture, dislocation or bone destruction. No significant joint effusions or arthropathic changes identified. No aggressive osseous lesions are seen. Ligaments Ligamentous assessment limited by motion. No ligamentous abnormalities are identified. Muscles and Tendons The medial flexor, anterior extensor, peroneal and Achilles tendons appear intact. No evidence of significant tenosynovitis or focal muscular abnormality. Generalized muscular atrophy noted. The plantar fascia is intact. Soft tissue Soft tissue assessment limited by motion. There appears to be mild subcutaneous edema lateral to the distal fibula and in the lateral hindfoot, although  no focal fluid collection or discrete soft tissue ulcer identified. IMPRESSION: 1. Significantly motion degraded examination. 2. No evidence of osteomyelitis, septic arthritis or soft tissue abscess. 3. Mild nonspecific subcutaneous edema lateral to the distal fibula and in the lateral hindfoot. 4. If further evaluation of suspected soft tissue infection is warranted clinically, consider CT with contrast which may be better tolerated by the patient. Radiographic correlation recommended regarding the possible bone lesion in the provided history. Electronically Signed   By: Carey Bullocks M.D.   On: 02/23/2023 15:59        Scheduled Meds:  atorvastatin  80 mg Oral QPM   clopidogrel  75 mg Oral Daily   enoxaparin (LOVENOX) injection  40 mg Subcutaneous QHS   feeding supplement  237 mL Oral BID BM   folic acid  1 mg Oral Daily   gabapentin  300 mg Oral TID   guaiFENesin  1,200 mg Oral BID   insulin aspart  0-9 Units Subcutaneous TID WC   iron polysaccharides  150 mg Oral Daily   leptospermum manuka honey  1 Application Topical Daily   pantoprazole  40 mg Oral BID   phosphorus  250 mg Oral BID   Continuous Infusions:  ampicillin-sulbactam (UNASYN) IV 3 g (02/25/23 1113)   magnesium sulfate bolus IVPB       LOS: 3 days    Time spent: 40 minutes    Ramiro Harvest, MD [facility]   To  contact the attending provider between 7A-7P or the covering provider during after hours 7P-7A, please log into the web site [email or web address] and access using universal [place] password for that web site. If you do not have the password, please call the hospital operator.  02/25/2023, 12:06 PM

## 2023-02-25 NOTE — Progress Notes (Signed)
Speech Language Pathology Treatment: Dysphagia  Patient Details Name: Crystal Moore MRN: [id number] DOB: [birth year] Today's Date: 02/25/2023 Time: 1000-1010 SLP Time Calculation (min) (ACUTE ONLY): 10 min  Assessment / Plan / Recommendation Clinical Impression  Patient seen by SLP for skilled treatment focused on dysphagia goals. She was awake and alert when SLP arrived to room. NT was checking on patient and she reported she ate well at breakfast and no coughing observed. Patient was receptive to having some juice. After raising HOB to upright position, she then drank juice via straw sips. She drank quickly but with swallow  initiation appearing timely and no overt s/s aspiration observed during swallow. She did exhibit mild throat clearing after PO intake suspicious for GERD/esophageal phase dysphagia. SLP recommending to continue on current diet and no further skilled SLP services indicated at this time.    HPI HPI: Patient is a 77 y.o. female with PMH: tage IV non-small cell lung cancer on palliative radiation, CVA with left-sided residual deficit, interstitial lung disease with chronic hypoxemia on 2 L, type 2 diabetes, PVD, gastric AVMs, COPD, dementia. She presented to the hospital on 02/22/23 with AMS. Daughter provided history in ED and patient is only oriented to self at baseline. She finished her last palliative radiation treatment in May of 2024. In ED, patient with 100.1 degrees F temperature, HR 102, normal BP. CTA chest was negative for acute pulmonary embolism and redemonstrated known multiple bilateral spiculated pulmonary masses.  There is interval mild hazy pulmonary density in the left upper and bilateral lower lobe suggesting superimposed infection or inflammatory process.  There is also a filling defect within the trachea that could represent debris versus possible tracheal mass. She was admitted for sepsis and ?tracheal mass.      SLP Plan  All goals met;Discharge SLP treatment  due to (comment)      Recommendations for follow up therapy are one component of a multi-disciplinary discharge planning process, led by the attending physician.  Recommendations may be updated based on patient status, additional functional criteria and insurance authorization.    Recommendations  Diet recommendations: Dysphagia 3 (mechanical soft);Thin liquid Liquids provided via: Cup;Straw Medication Administration: Whole meds with puree Supervision: Full supervision/cueing for compensatory strategies;Staff to assist with self feeding;Patient able to self feed Compensations: Slow rate;Small sips/bites;Minimize environmental distractions Postural Changes and/or Swallow Maneuvers: Seated upright 90 degrees                  Oral care BID;Staff/trained caregiver to provide oral care   Frequent or constant Supervision/Assistance Dysphagia, unspecified (R13.10)     All goals met;Discharge SLP treatment due to (comment)     Angela Nevin, MA, CCC-SLP Speech Therapy

## 2023-02-25 NOTE — Care Management Important Message (Signed)
Important Message  Patient Details  Name: Crystal Moore MRN: [id number] Date of Birth: 1946-05-09   Medicare Important Message Given:  Yes     Dorena Bodo 02/25/2023, 2:39 PM

## 2023-02-25 NOTE — Plan of Care (Signed)

## 2023-02-26 DIAGNOSIS — G9341 Metabolic encephalopathy: Secondary | ICD-10-CM | POA: Diagnosis not present

## 2023-02-26 DIAGNOSIS — J432 Centrilobular emphysema: Secondary | ICD-10-CM | POA: Diagnosis not present

## 2023-02-26 DIAGNOSIS — A419 Sepsis, unspecified organism: Secondary | ICD-10-CM | POA: Diagnosis not present

## 2023-02-26 LAB — GLUCOSE, CAPILLARY
Glucose-Capillary: 180 mg/dL — ABNORMAL HIGH (ref 70–99)
Glucose-Capillary: 156 mg/dL — ABNORMAL HIGH (ref 70–99)
Glucose-Capillary: 206 mg/dL — ABNORMAL HIGH (ref 70–99)
Glucose-Capillary: 185 mg/dL — ABNORMAL HIGH (ref 70–99)
Glucose-Capillary: 141 mg/dL — ABNORMAL HIGH (ref 70–99)

## 2023-02-26 MED ORDER — POTASSIUM CHLORIDE CRYS ER 10 MEQ PO TBCR
40.0000 meq | EXTENDED_RELEASE_TABLET | Freq: Once | ORAL | Status: AC
  Administered 2023-02-26: 40 meq via ORAL
  Filled 2023-02-26: qty 4

## 2023-02-26 MED ORDER — FUROSEMIDE 10 MG/ML IJ SOLN
20.0000 mg | Freq: Once | INTRAMUSCULAR | Status: AC
  Administered 2023-02-26: 20 mg via INTRAVENOUS
  Filled 2023-02-26: qty 2

## 2023-02-26 MED ORDER — AMOXICILLIN-POT CLAVULANATE 875-125 MG PO TABS
1.0000 | ORAL_TABLET | Freq: Two times a day (BID) | ORAL | Status: DC
  Administered 2023-02-26 – 2023-02-27 (×3): 1 via ORAL
  Filled 2023-02-26 (×3): qty 1

## 2023-02-26 NOTE — Telephone Encounter (Signed)
Office notes sent

## 2023-02-26 NOTE — Plan of Care (Signed)

## 2023-02-26 NOTE — Progress Notes (Signed)
PROGRESS NOTE    Crystal Moore  WUJ:[id number] DOB: 09-19-1945 DOA: 02/22/2023 PCP: Lorenda Ishihara, MD    Chief Complaint  Patient presents with   Altered Mental Status    Brief Narrative:  HPI per Dr. Benita Gutter on 02/22/23 Crystal Moore is a 77 y.o. female with medical history significant of Stage IV non-small cell lung cancer on palliative radiation, CVA with left-sided residual deficit, interstitial lung disease with chronic hypoxemia on 2 L, type 2 diabetes, PVD, gastric AVMs, COPD who presents with altered mental status.   Daughter at bedside provides hx as pt has dementia and is oriented only to self.  Aid at home told daughter that pt was not eating today, having tremors. Over the weekend has been more non-verbal. Her oxygen concentrator also ran out. Pt also sometimes get tired of wearing her oxygen and takes it off. She is on 2L O2 at baseline. Pt mostly bed bound or sit in recliner chair throughout the day.  New cough yesterday. No fever. No vomiting or diarrhea. Had wounds on both feet that recently came back after healing up with wound care for the past year.    Pt had last palliative radiation in May and daughter feels her symptoms of cough and sputum production has improved significantly.    In the ED, she had temperature 100.1 F, heart rate of 102, normal blood pressure and on her baseline 2 L.   Mild leukocytosis of 12.7, hemoglobin 8.8 which is down from baseline of 9-10.  Hemoccult is negative however.   No significant electrolyte abnormalities.   CT head negative   CTA chest was negative for acute pulmonary embolism and redemonstrated known multiple bilateral spiculated pulmonary masses.  There is interval mild hazy pulmonary density in the left upper and bilateral lower lobe suggesting superimposed infection or inflammatory process.  There is also a filling defect within the trachea that could represent debris versus possible tracheal mass.   She was given  sepsis protocol fluid and started on broad-spectrum IV antibiotics for community-acquired pneumonia.  Hospitalist consulted for admission.    **Interim History Wound care evaluated and we will obtain a MRI of her ankle given her pain and findings.  Will continue treatment and daughter thinks she is doing better.  SLP to further evaluate and they are recommending dysphagia 3 mechanical soft diet with thin liquids.     Assessment & Plan:   Principal Problem:   Sepsis (HCC) Active Problems:   COPD (chronic obstructive pulmonary disease) (HCC)   ILD (interstitial lung disease) (HCC)   Chronic respiratory failure with hypoxia (HCC)   Diabetes (HCC)   Hypertension   Pressure injury of skin of both feet   Dementia without behavioral disturbance (HCC)   Acute metabolic encephalopathy   History of CVA (cerebrovascular accident)   Tracheal mass   Complicated UTI (urinary tract infection)   Aspiration pneumonia (HCC)   Hypophosphatemia  #1 sepsis secondary to aspiration pneumonia -Patient presented with criteria for sepsis with elevated temperature, tachycardia, and leukocytosis. -CT chest done demonstrating potential debris versus mass in the trachea along with hazy pulmonary density to the upper and lower lobes. -Currently afebrile, leukocytosis trended down. -Improving clinically. -Was on IV Rocephin and azithromycin transitioned to IV Unasyn. -Saline lock IV fluids. -Transition from Unasyn to Augmentin. -Patient with some minimal to mild expiratory wheezing, and as such we will give a dose of Lasix 20 mg IV x 1.  Monitor urine output with Lasix. -Continue as  needed nebs, Mucinex, flutter valve, incentive spirometry, PPI.  2.??  Tracheal mass versus debris -CT angiogram chest with concerns for debris versus tracheal mass. -Dr. Cyndia Bent discuss findings with daughter and felt likely it is debris but will wait to show clinical improvement prior to rescanning. -Patient-noted to have a PET scan  in March unfortunately had a significant artifact in the neck. -Outpatient follow-up.  3.  History of CVA -With residual left-sided deficit. -Continue Plavix for secondary stroke prophylaxis. -PT/OT.  4.  Acute metabolic encephalopathy superimposed on chronic dementia -Secondary to infectious etiology from aspiration pneumonia in the setting of dementia. -Clinical improvement. -Close to baseline. -Continue current antibiotics.  5.  Dementia without behavioral disturbance -Patient noted to be oriented to self usually only.  Per prior MD patient also noted to be mostly bedbound. -Supportive care.  6.  Hypertension -BP soft yesterday and as such Norvasc discontinued. -Continue to hold Norvasc..  7.  Diabetes mellitus type 2 -Hemoglobin A1c 7.4 (09/22/2022) -CBG noted at 156 this morning. -Noted to be on Lantus 25 units daily prior to admission in addition to Tradjenta. -Continue to hold Lantus and Tradjenta.   -SSI.    8.  Hypophosphatemia -Improved, phosphorus at 2.9 today. -Continue oral phosphorus tabs. -Follow.  9.  Interstitial lung disease/chronic hypoxemic respiratory failure -Noted to be on baseline home O2 of 2 L/min. -Being treated for an aspiration pneumonia. -Patient with some minimal to mild expiratory wheezing and as such we will give a dose of Lasix 20 mg IV x 1. -Nebs as needed.  10.  COPD -Stable.  11.  Normocytic anemia -Anemia panel with iron level of 10, TIBC of 186, ferritin of 137, folate of 7.5, vitamin B12 of 398. -Hemoglobin currently stable at 7.4, this morning, likely dilutional effect. -Patient with no overt bleeding. -Saline lock IV fluids. -Follow H&H. -Transfusion threshold hemoglobin < 7.  12.  Pressure injury of skin of both feet, POA Pressure Injury 02/23/23 Foot Right;Medial Deep Tissue Pressure Injury - Purple or maroon localized area of discolored intact skin or blood-filled blister due to damage of underlying soft tissue from  pressure and/or shear. DTPI evolving to full thickness (Active)  02/23/23 0700  Location: Foot  Location Orientation: Right;Medial  Staging: Deep Tissue Pressure Injury - Purple or maroon localized area of discolored intact skin or blood-filled blister due to damage of underlying soft tissue from pressure and/or shear.  Wound Description (Comments): DTPI evolving to full thickness 3 cm x 3 cm  Present on Admission: Yes     Pressure Injury 02/23/23 Ankle Left;Lateral Stage 3 -  Full thickness tissue loss. Subcutaneous fat may be visible but bone, tendon or muscle are NOT exposed. L lateral malleolus 4 cm x 2 cm (Active)  02/23/23 0000  Location: Ankle  Location Orientation: Left;Lateral  Staging: Stage 3 -  Full thickness tissue loss. Subcutaneous fat may be visible but bone, tendon or muscle are NOT exposed.  Wound Description (Comments): L lateral malleolus 4 cm x 2 cm  Present on Admission: Yes     Pressure Injury 02/23/23 Hip Left;Lateral Unstageable - Full thickness tissue loss in which the base of the injury is covered by slough (yellow, tan, gray, green or brown) and/or eschar (tan, brown or black) in the wound bed. 2 cm x 2 cm 100% black escha (Active)  02/23/23 0000  Location: Hip  Location Orientation: Left;Lateral  Staging: Unstageable - Full thickness tissue loss in which the base of the injury is covered by  slough (yellow, tan, gray, green or brown) and/or eschar (tan, brown or black) in the wound bed.  Wound Description (Comments): 2 cm x 2 cm 100% black eschar  Present on Admission: Yes     Pressure Injury 02/23/23 Knee Left;Lateral 1 cm x 1 cm L lateral knee 100% brown eschar (Active)  02/23/23 0805  Location: Knee  Location Orientation: Left;Lateral  Staging:   Wound Description (Comments): 1 cm x 1 cm L lateral knee 100% brown eschar  Present on Admission: Yes     Pressure Injury 02/23/23 Heel Medial;Right Stage 2 -  Partial thickness loss of dermis presenting as a  shallow open injury with a red, pink wound bed without slough. 3 cm x 2 cm pink dry (Active)  02/23/23   Location: Heel  Location Orientation: Medial;Right  Staging: Stage 2 -  Partial thickness loss of dermis presenting as a shallow open injury with a red, pink wound bed without slough.  Wound Description (Comments): 3 cm x 2 cm pink dry  Present on Admission: Yes     -Repeat hemoglobin A1c.  DVT prophylaxis: Lovenox Code Status:  Family Communication: Updated daughter at bedside.  Disposition: Home with home health when clinically stable and improved and on oral antibiotics hopefully tomorrow.    Status is: Inpatient Remains inpatient appropriate because: Severity of illness   Consultants:  None  Procedures: Chest x-ray 02/24/2023, 02/23/2023, 02/22/2023 CT angiogram chest 02/22/2023 CT head 02/22/2023 MRI left ankle 02/23/2023   Antimicrobials:  Anti-infectives (From admission, onward)    Start     Dose/Rate Route Frequency Ordered Stop   02/26/23 1000  amoxicillin-clavulanate (AUGMENTIN) 875-125 MG per tablet 1 tablet        1 tablet Oral Every 12 hours 02/26/23 0843 03/02/23 0959   02/23/23 0000  Ampicillin-Sulbactam (UNASYN) 3 g in sodium chloride 0.9 % 100 mL IVPB  Status:  Discontinued        3 g 200 mL/hr over 30 Minutes Intravenous Every 6 hours 02/22/23 2319 02/26/23 0843   02/22/23 2000  cefTRIAXone (ROCEPHIN) 2 g in sodium chloride 0.9 % 100 mL IVPB  Status:  Discontinued        2 g 200 mL/hr over 30 Minutes Intravenous Every 24 hours 02/22/23 1945 02/22/23 2314   02/22/23 2000  azithromycin (ZITHROMAX) 500 mg in sodium chloride 0.9 % 250 mL IVPB  Status:  Discontinued        500 mg 250 mL/hr over 60 Minutes Intravenous Every 24 hours 02/22/23 1945 02/22/23 2314         Subjective: Patient sitting upright in bed.  Denies any chest pain or shortness of breath.  Granddaughter at bedside.   Objective: Vitals:   02/25/23 1732 02/25/23 1925 02/26/23 0424  02/26/23 0726  BP: (!) 129/55 (!) 136/51 (!) 128/44 (!) 144/78  Pulse: 94 87 81 94  Resp: 16 16 17    Temp: 98.3 F (36.8 C) 99.3 F (37.4 C) 98.1 F (36.7 C) 98.4 F (36.9 C)  TempSrc: Oral Oral Oral Oral  SpO2: 95% 96% 92% 94%  Weight:      Height:        Intake/Output Summary (Last 24 hours) at 02/26/2023 1239 Last data filed at 02/26/2023 1610 Gross per 24 hour  Intake 1191.77 ml  Output 450 ml  Net 741.77 ml   Filed Weights   02/22/23 1452 02/23/23 1217  Weight: 64 kg 61.3 kg    Examination:  General exam: NAD. Respiratory system: Some scattered  coarse bibasilar breath sounds.  Some minimal to mild expiratory wheezing.  Cardiovascular system: Regular rate rhythm no murmurs rubs or gallops.  No JVD.  No lower extremity edema. Gastrointestinal system: Abdomen is soft, nontender, nondistended, positive bowel sounds.  No rebound.  No guarding.  Central nervous system: Alert. No focal neurological deficits. Extremities: Symmetric 5 x 5 power. Skin: No rashes, lesions or ulcers Psychiatry: Judgement and insight appear poor to fair. Mood & affect appropriate.     Data Reviewed: I have personally reviewed following labs and imaging studies  CBC: Recent Labs  Lab 02/22/23 1800 02/23/23 1013 02/23/23 2357 02/24/23 1144 02/24/23 2319 02/25/23 0947 02/26/23 0337  WBC 12.7* 10.2 10.3 10.5 8.5  --  8.6  NEUTROABS 9.9* 8.0* 7.9* 8.3* 6.5  --   --   HGB 8.8* 7.9* 7.2* 7.3* 7.0* 8.1* 7.4*  HCT 28.7* 25.9* 23.0* 23.1* 22.4* 26.0* 23.4*  MCV 82.0 80.7 79.6* 80.8 79.2*  --  79.9*  PLT 283 261 245 307 241  --  277    Basic Metabolic Panel: Recent Labs  Lab 02/23/23 1013 02/23/23 2357 02/24/23 1144 02/24/23 2319 02/26/23 0337  NA 143 141 141 139 137  K 3.6 3.5 3.5 3.8 3.5  CL 111 105 108 107 105  CO2 26 25 26 22 25   GLUCOSE 91 88 141* 144* 161*  BUN 12 10 8 10 8   CREATININE 0.75 0.76 0.73 0.74 0.75  CALCIUM 8.4* 8.3* 8.3* 7.9* 7.8*  MG 1.9 1.8 1.9 1.8 2.1  PHOS  2.4* 2.4* 2.2* 2.5 2.9    GFR: Estimated Creatinine Clearance: 51.6 mL/min (by C-G formula based on SCr of 0.75 mg/dL).  Liver Function Tests: Recent Labs  Lab 02/22/23 1800 02/23/23 1013 02/23/23 2357 02/24/23 1144 02/24/23 2319 02/26/23 0337  AST 15 15 13*  --   --   --   ALT 18 17 16   --   --   --   ALKPHOS 74 58 53  --   --   --   BILITOT 0.4 0.4 0.3  --   --   --   PROT 7.7 7.0 6.3*  --   --   --   ALBUMIN 2.6* 2.3* 2.1* 2.3* 1.7* 1.8*    CBG: Recent Labs  Lab 02/25/23 1730 02/25/23 2208 02/26/23 0158 02/26/23 0727 02/26/23 1142  GLUCAP 163* 120* 180* 156* 206*     Recent Results (from the past 240 hour(s))  SARS Coronavirus 2 by RT PCR (hospital order, performed in [facility] hospital lab) *cepheid single result test* Anterior Nasal Swab     Status: None   Collection Time: 02/22/23  3:05 PM   Specimen: Anterior Nasal Swab  Result Value Ref Range Status   SARS Coronavirus 2 by RT PCR NEGATIVE NEGATIVE Final    Comment: Performed at [facility], [address] [place], [place] [address]  Blood Culture (Routine X 2)     Status: None (Preliminary result)   Collection Time: 02/22/23  6:00 PM   Specimen: BLOOD RIGHT WRIST  Result Value Ref Range Status   Specimen Description BLOOD RIGHT WRIST  Final   Special Requests   Final    BOTTLES DRAWN AEROBIC AND ANAEROBIC Blood Culture results may not be optimal due to an inadequate volume of blood received in culture bottles   Culture   Final    NO GROWTH 4 DAYS Performed at [facility], [address] [place], [place] [address]  Report Status PENDING  Incomplete  Blood Culture (Routine X 2)     Status: None (Preliminary result)   Collection Time: 02/22/23  6:10 PM   Specimen: BLOOD LEFT FOREARM  Result Value Ref Range Status   Specimen Description BLOOD LEFT FOREARM  Final   Special Requests   Final    BOTTLES DRAWN AEROBIC AND ANAEROBIC Blood Culture results may not be optimal due to an  inadequate volume of blood received in culture bottles   Culture   Final    NO GROWTH 4 DAYS Performed at [facility], [address] [place], [place] [address]    Report Status PENDING  Incomplete         Radiology Studies: No results found.      Scheduled Meds:  amoxicillin-clavulanate  1 tablet Oral Q12H   atorvastatin  80 mg Oral QPM   clopidogrel  75 mg Oral Daily   enoxaparin (LOVENOX) injection  40 mg Subcutaneous QHS   feeding supplement  237 mL Oral BID BM   folic acid  1 mg Oral Daily   gabapentin  300 mg Oral TID   guaiFENesin  1,200 mg Oral BID   insulin aspart  0-9 Units Subcutaneous TID WC   iron polysaccharides  150 mg Oral Daily   leptospermum manuka honey  1 Application Topical Daily   pantoprazole  40 mg Oral BID   phosphorus  250 mg Oral BID   Continuous Infusions:     LOS: 4 days    Time spent: 40 minutes    Ramiro Harvest, MD [facility]   To contact the attending provider between 7A-7P or the covering provider during after hours 7P-7A, please log into the web site [email or web address] and access using universal  password for that web site. If you do not have the password, please call the hospital operator.  02/26/2023, 12:39 PM

## 2023-02-27 ENCOUNTER — Other Ambulatory Visit (HOSPITAL_COMMUNITY): Payer: Self-pay

## 2023-02-27 DIAGNOSIS — G9341 Metabolic encephalopathy: Secondary | ICD-10-CM | POA: Diagnosis not present

## 2023-02-27 DIAGNOSIS — J432 Centrilobular emphysema: Secondary | ICD-10-CM | POA: Diagnosis not present

## 2023-02-27 DIAGNOSIS — A419 Sepsis, unspecified organism: Secondary | ICD-10-CM | POA: Diagnosis not present

## 2023-02-27 LAB — GLUCOSE, CAPILLARY
Glucose-Capillary: 178 mg/dL — ABNORMAL HIGH (ref 70–99)
Glucose-Capillary: 135 mg/dL — ABNORMAL HIGH (ref 70–99)

## 2023-02-27 MED ORDER — GUAIFENESIN ER 600 MG PO TB12
1200.0000 mg | ORAL_TABLET | Freq: Two times a day (BID) | ORAL | 0 refills | Status: AC
  Filled 2023-02-27: qty 20, 5d supply, fill #0

## 2023-02-27 MED ORDER — K PHOS MONO-SOD PHOS DI & MONO 155-852-130 MG PO TABS
250.0000 mg | ORAL_TABLET | Freq: Two times a day (BID) | ORAL | 0 refills | Status: AC
  Filled 2023-02-27: qty 6, 3d supply, fill #0

## 2023-02-27 MED ORDER — AMOXICILLIN-POT CLAVULANATE 875-125 MG PO TABS
1.0000 | ORAL_TABLET | Freq: Two times a day (BID) | ORAL | 0 refills | Status: AC
  Filled 2023-02-27: qty 6, 3d supply, fill #0

## 2023-02-27 MED ORDER — LEVALBUTEROL HCL 0.63 MG/3ML IN NEBU
0.6300 mg | INHALATION_SOLUTION | Freq: Three times a day (TID) | RESPIRATORY_TRACT | 0 refills | Status: DC
  Filled 2023-02-27: qty 75, 5d supply, fill #0

## 2023-02-27 MED ORDER — INSULIN GLARGINE 100 UNIT/ML ~~LOC~~ SOLN: 10.0000 [IU] | Freq: Every day | SUBCUTANEOUS | Status: AC

## 2023-02-27 MED ORDER — IPRATROPIUM BROMIDE 0.02 % IN SOLN
0.5000 mg | Freq: Three times a day (TID) | RESPIRATORY_TRACT | 0 refills | Status: DC
  Filled 2023-02-27: qty 75, 5d supply, fill #0

## 2023-02-27 MED ORDER — ALBUTEROL SULFATE (2.5 MG/3ML) 0.083% IN NEBU
INHALATION_SOLUTION | RESPIRATORY_TRACT | 0 refills | Status: DC
  Filled 2023-02-27: qty 360, fill #0

## 2023-02-27 MED ORDER — ALBUTEROL SULFATE (2.5 MG/3ML) 0.083% IN NEBU: 2.5000 mg | INHALATION_SOLUTION | RESPIRATORY_TRACT | Status: DC | PRN

## 2023-02-27 NOTE — Plan of Care (Signed)

## 2023-02-27 NOTE — Plan of Care (Signed)

## 2023-02-27 NOTE — Discharge Summary (Signed)
Physician Discharge Summary  Crystal Moore EXB:[id number] DOB: October 04, 1945 DOA: 02/22/2023  PCP: Lorenda Ishihara, MD  Admit date: 02/22/2023 Discharge date: 02/27/2023  Time spent: 60 minutes  Recommendations for Outpatient Follow-up:  Follow-up with Lorenda Ishihara, MD in 1 to 2 weeks.  On follow-up patient will need a renal panel to follow-up on electrolytes, renal function.  Patient's aspiration pneumonia will need to be followed up upon.   Discharge Diagnoses:  Principal Problem:   Sepsis (HCC) Active Problems:   COPD (chronic obstructive pulmonary disease) (HCC)   ILD (interstitial lung disease) (HCC)   Chronic respiratory failure with hypoxia (HCC)   Diabetes (HCC)   Hypertension   Pressure injury of skin of both feet   Dementia without behavioral disturbance (HCC)   Acute metabolic encephalopathy   History of CVA (cerebrovascular accident)   Tracheal mass   Complicated UTI (urinary tract infection)   Aspiration pneumonia (HCC)   Hypophosphatemia   Discharge Condition: Stable and improved.  Diet recommendation: Dysphagia 3 diet  Filed Weights   02/23/23 1217 02/27/23 0500 02/27/23 0625  Weight: 61.3 kg 60.6 kg 60.6 kg    History of present illness:  HPI per Dr. Veverly Fells Crystal Moore is a 77 y.o. female with medical history significant of Stage IV non-small cell lung cancer on palliative radiation, CVA with left-sided residual deficit, interstitial lung disease with chronic hypoxemia on 2 L, type 2 diabetes, PVD, gastric AVMs, COPD who presents with altered mental status.   Daughter at bedside provides hx as pt has dementia and is oriented only to self.  Aid at home told daughter that pt was not eating today, having tremors. Over the weekend has been more non-verbal. Her oxygen concentrator also ran out. Pt also sometimes get tired of wearing her oxygen and takes it off. She is on 2L O2 at baseline. Pt mostly bed bound or sit in recliner chair throughout the  day.  New cough yesterday. No fever. No vomiting or diarrhea. Had wounds on both feet that recently came back after healing up with wound care for the past year.    Pt had last palliative radiation in May and daughter feels her symptoms of cough and sputum production has improved significantly.    In the ED, she had temperature 100.1 F, heart rate of 102, normal blood pressure and on her baseline 2 L.   Mild leukocytosis of 12.7, hemoglobin 8.8 which is down from baseline of 9-10.  Hemoccult is negative however.   No significant electrolyte abnormalities.   CT head negative   CTA chest was negative for acute pulmonary embolism and redemonstrated known multiple bilateral spiculated pulmonary masses.  There is interval mild hazy pulmonary density in the left upper and bilateral lower lobe suggesting superimposed infection or inflammatory process.  There is also a filling defect within the trachea that could represent debris versus possible tracheal mass.   She was given sepsis protocol fluid and started on broad-spectrum IV antibiotics for community-acquired pneumonia.  Hospitalist consulted for admission.   Hospital Course:  #1 sepsis secondary to aspiration pneumonia -Patient presented with criteria for sepsis with elevated temperature, tachycardia, and leukocytosis. -CT chest done demonstrating potential debris versus mass in the trachea along with hazy pulmonary density to the upper and lower lobes. -Patient remained afebrile during the hospitalization, leukocytosis trended down and had resolved by day of discharge.   -Patient treated empirically with IV Rocephin and azithromycin subsequently transition to IV Unasyn and as patient improved clinically  she was transition to Augmentin.   -Patient also received a dose of Lasix 20 mg IV x 1 on 02/26/2023 as patient noted to have some mild expiratory wheezing which resolved by day of discharge.   -Patient be discharged on 3 more days of Augmentin,  scheduled Xopenex and Atrovent nebs x 5 days, Mucinex, PPI.   -Patient will be discharged in stable and improved condition with close outpatient follow-up with PCP.    2.??  Tracheal mass versus debris -CT angiogram chest with concerns for debris versus tracheal mass. -Dr. Cyndia Bent discuss findings with daughter and felt likely it is debris but will wait to show clinical improvement prior to rescanning. -Patient-noted to have a PET scan in March unfortunately had a significant artifact in the neck. -Outpatient follow-up.   3.  History of CVA -With residual left-sided deficit. -Patient maintained on Plavix for secondary stroke prophylaxis.  -Patient seen by PT/OT will be discharged with home health therapies.     4.  Acute metabolic encephalopathy superimposed on chronic dementia -Secondary to infectious etiology from aspiration pneumonia in the setting of dementia. -Patient improved clinically with treatment of aspiration pneumonia and was close to baseline by day of discharge.   -Outpatient follow-up.     5.  Dementia without behavioral disturbance -Patient noted to be oriented to self usually only.  Per prior MD patient also noted to be mostly bedbound.   6.  Hypertension -BP soft yesterday and as such Norvasc discontinued. -Norvasc was held throughout the hospitalization and will be resumed on discharge.   7.  Diabetes mellitus type 2 -Hemoglobin A1c 7.4 (09/22/2022) -Noted to be on Lantus 25 units daily prior to admission in addition to Tradjenta. -Lantus and Tradjenta were held during the hospitalization and patient blood glucose levels were controlled on sliding scale insulin. -Patient be discharged back on home regimen Tradjenta as well as a decreased dose of Lantus 10 units daily. -Outpatient follow-up with PCP.   8.  Hypophosphatemia -Improved on oral phosphorus tablets.   -Patient was discharged on 3 more days of oral phosphorus tablets.   -Outpatient follow-up with PCP.    9.   Interstitial lung disease/chronic hypoxemic respiratory failure -Noted to be on baseline home O2 of 2 L/min. -Being treated for an aspiration pneumonia. -Patient with some minimal to mild expiratory wheezing on 02/26/2023, patient received a dose of Lasix 20 mg IV x 1 with good urine output and resolution of expiratory wheezing by day of discharge.   -Patient also maintained on nebs as needed.    10.  COPD -Remained stable throughout the hospitalization.     11.  Normocytic anemia -Anemia panel with iron level of 10, TIBC of 186, ferritin of 137, folate of 7.5, vitamin B12 of 398. -Hemoglobin currently remained stable at 7.8 by day of discharge.  -Patient with no overt bleeding throughout the hospitalization and felt anemia likely dilutional.   12.  Pressure injury of skin of both feet, POA Pressure Injury 02/23/23 Foot Right;Medial Deep Tissue Pressure Injury - Purple or maroon localized area of discolored intact skin or blood-filled blister due to damage of underlying soft tissue from pressure and/or shear. DTPI evolving to full thickness (Active)  02/23/23 0700  Location: Foot  Location Orientation: Right;Medial  Staging: Deep Tissue Pressure Injury - Purple or maroon localized area of discolored intact skin or blood-filled blister due to damage of underlying soft tissue from pressure and/or shear.  Wound Description (Comments): DTPI evolving to full thickness 3 cm  x 3 cm  Present on Admission: Yes     Pressure Injury 02/23/23 Ankle Left;Lateral Stage 3 -  Full thickness tissue loss. Subcutaneous fat may be visible but bone, tendon or muscle are NOT exposed. L lateral malleolus 4 cm x 2 cm (Active)  02/23/23 0000  Location: Ankle  Location Orientation: Left;Lateral  Staging: Stage 3 -  Full thickness tissue loss. Subcutaneous fat may be visible but bone, tendon or muscle are NOT exposed.  Wound Description (Comments): L lateral malleolus 4 cm x 2 cm  Present on Admission: Yes      Pressure Injury 02/23/23 Hip Left;Lateral Unstageable - Full thickness tissue loss in which the base of the injury is covered by slough (yellow, tan, gray, green or brown) and/or eschar (tan, brown or black) in the wound bed. 2 cm x 2 cm 100% black escha (Active)  02/23/23 0000  Location: Hip  Location Orientation: Left;Lateral  Staging: Unstageable - Full thickness tissue loss in which the base of the injury is covered by slough (yellow, tan, gray, green or brown) and/or eschar (tan, brown or black) in the wound bed.  Wound Description (Comments): 2 cm x 2 cm 100% black eschar  Present on Admission: Yes     Pressure Injury 02/23/23 Knee Left;Lateral 1 cm x 1 cm L lateral knee 100% brown eschar (Active)  02/23/23 0805  Location: Knee  Location Orientation: Left;Lateral  Staging:   Wound Description (Comments): 1 cm x 1 cm L lateral knee 100% brown eschar  Present on Admission: Yes     Pressure Injury 02/23/23 Heel Medial;Right Stage 2 -  Partial thickness loss of dermis presenting as a shallow open injury with a red, pink wound bed without slough. 3 cm x 2 cm pink dry (Active)  02/23/23   Location: Heel  Location Orientation: Medial;Right  Staging: Stage 2 -  Partial thickness loss of dermis presenting as a shallow open injury with a red, pink wound bed without slough.  Wound Description (Comments): 3 cm x 2 cm pink dry  Present on Admission: Yes     Procedures: Chest x-ray 02/24/2023, 02/23/2023, 02/22/2023 CT angiogram chest 02/22/2023 CT head 02/22/2023 MRI left ankle 02/23/2023  Consultations: None  Discharge Exam: Vitals:   02/26/23 1940 02/27/23 0729  BP: (!) 101/47 (!) 140/66  Pulse: 85 100  Resp: 16 16  Temp: 98.3 F (36.8 C) 97.7 F (36.5 C)  SpO2: 100% 97%    General: NAD Cardiovascular: RRR no murmurs rubs or gallops.  No JVD.  No lower extremity edema. Respiratory: CTAB.  No wheezes, no crackles, no rhonchi.  Fair air movement.  Discharge  Instructions   Discharge Instructions     Diet Carb Modified   Complete by: As directed    Dysphagia 3 diet   Discharge wound care:   Complete by: As directed    Pressure Injury 02/23/23 Ankle Left;Lateral Stage 3 -  Full thickness tissue loss. Subcutaneous fat may be visible but bone, tendon or muscle are NOT exposed. L lateral malleolus 4 cm x 2 cm 4 days    Pressure Injury 02/23/23 Foot Right;Medial Deep Tissue Pressure Injury - Purple or maroon localized area of discolored intact skin or blood-filled blister due to damage of underlying soft tissue from pressure and/or shear. DTPI evolving to full thickness 4 days   Pressure Injury 02/23/23 Heel Medial;Right Stage 2 -  Partial thickness loss of dermis presenting as a shallow open injury with a red, pink wound bed without  slough. 3 cm x 2 cm pink dry 4 days   Pressure Injury 02/23/23 Hip Left;Lateral Unstageable - Full thickness tissue loss in which the base of the injury is covered by slough (yellow, tan, gray, green or brown) and/or eschar (tan, brown or black) in the wound bed. 2 cm x 2 cm 100% black escha 4 days   Pressure Injury 02/23/23 Knee Left;Lateral 1 cm x 1 cm L lateral knee 100% brown eschar 4 days      Wound Care Orders (From admission, onward)      Start     Ordered    02/24/23 0500    Wound care  Daily      Comments: 1.         Clean R medial foot with Vashe wound cleanser Hart [place] [phone]) and apply Xeroform gauze [place] [phone]) to wound bed daily, cover with Telfa non-stick dressing and silicone foam. Place foot in Prevalon boot.   2.         Clean L lateral malleolus wound with Vashe  wound cleanser, apply Vashe moistened (not saturated) gauze to wound bed twice daily.  Cover with dry gauze and silicone foam dressing. Place foot in Prevalon boot.  02/23/23 0801    02/23/23 0802    Wound care  Daily      Comments: Clean Unstageable PI to L hip and L medial knee with Vashe wound cleanser, apply Medihoney to wound beds  daily, cover with dry gauze and silicone foam. May lift foam daily to reapply Medihoney.  Change foam dressing q3 days and prn soiling.   Increase activity slowly   Complete by: As directed       Allergies as of 02/27/2023       Reactions   Aspirin    Can not take due to previous GI bleed - per daughter        Medication List     TAKE these medications    Accu-Chek Guide test strip Generic drug: glucose blood USE TO test AS DIRECTED TWICE DAILY What changed: See the new instructions.   Accu-Chek Softclix Lancets lancets 1 each by Other route daily.   acetaminophen 500 MG tablet Commonly known as: TYLENOL Take 1,000 mg by mouth every 6 (six) hours as needed (pain).   albuterol 108 (90 Base) MCG/ACT inhaler Commonly known as: VENTOLIN HFA Inhale 1 puff into the lungs every 6 (six) hours as needed for wheezing or shortness of breath.   albuterol (2.5 MG/3ML) 0.083% nebulizer solution Commonly known as: PROVENTIL Inhale 3 mLs (2.5 mg total) into the lungs every 4 (four) hours as needed for wheezing or shortness of breath.   amLODipine 5 MG tablet Commonly known as: NORVASC Take 5 mg by mouth in the morning.   amoxicillin-clavulanate 875-125 MG tablet Commonly known as: AUGMENTIN Take 1 tablet by mouth every 12 (twelve) hours for 3 days.   atorvastatin 80 MG tablet Commonly known as: LIPITOR Take 1 tablet (80 mg total) by mouth daily at 6 PM. What changed: when to take this   clopidogrel 75 MG tablet Commonly known as: PLAVIX Take 1 tablet (75 mg total) by mouth daily.   feeding supplement Liqd Take 237 mLs by mouth 2 (two) times daily between meals.   FeroSul 325 (65 Fe) MG tablet Generic drug: ferrous sulfate Take 1 tablet (325 mg total) by mouth daily with breakfast.   folic acid 1 MG tablet Commonly known as: FOLVITE Take 1 tablet (1 mg total) by  mouth daily.   gabapentin 300 MG capsule Commonly known as: NEURONTIN Take 1 capsule (300 mg total) by  mouth 3 (three) times daily.   insulin glargine 100 UNIT/ML injection Commonly known as: LANTUS Inject 0.1 mLs (10 Units total) into the skin daily. What changed: how much to take   ipratropium 0.02 % nebulizer solution Commonly known as: ATROVENT Take 2.5 mLs (0.5 mg total) by nebulization 3 (three) times daily for 5 days.   levalbuterol 0.63 MG/3ML nebulizer solution Commonly known as: XOPENEX Take 3 mLs (0.63 mg total) by nebulization 3 (three) times daily for 5 days.   linagliptin 5 MG Tabs tablet Commonly known as: TRADJENTA Take 5 mg by mouth daily.   Mucus Relief 600 MG 12 hr tablet Generic drug: guaiFENesin Take 2 tablets (1,200 mg total) by mouth 2 (two) times daily for 5 days, then as needed.   pantoprazole 40 MG tablet Commonly known as: PROTONIX Take 40 mg by mouth 2 (two) times daily.   Phospha 250 Neutral [id number] MG Tabs Take 1 tablet (250 mg total) by mouth 2 (two) times daily for 3 days, then as needed   Santyl 250 UNIT/GM ointment Generic drug: collagenase Apply 1 Application topically daily.               Discharge Care Instructions  (From admission, onward)           Start     Ordered   02/27/23 0000  Discharge wound care:       Comments: Pressure Injury 02/23/23 Ankle Left;Lateral Stage 3 -  Full thickness tissue loss. Subcutaneous fat may be visible but bone, tendon or muscle are NOT exposed. L lateral malleolus 4 cm x 2 cm 4 days    Pressure Injury 02/23/23 Foot Right;Medial Deep Tissue Pressure Injury - Purple or maroon localized area of discolored intact skin or blood-filled blister due to damage of underlying soft tissue from pressure and/or shear. DTPI evolving to full thickness 4 days   Pressure Injury 02/23/23 Heel Medial;Right Stage 2 -  Partial thickness loss of dermis presenting as a shallow open injury with a red, pink wound bed without slough. 3 cm x 2 cm pink dry 4 days   Pressure Injury 02/23/23 Hip Left;Lateral Unstageable  - Full thickness tissue loss in which the base of the injury is covered by slough (yellow, tan, gray, green or brown) and/or eschar (tan, brown or black) in the wound bed. 2 cm x 2 cm 100% black escha 4 days   Pressure Injury 02/23/23 Knee Left;Lateral 1 cm x 1 cm L lateral knee 100% brown eschar 4 days      Wound Care Orders (From admission, onward)      Start     Ordered    02/24/23 0500    Wound care  Daily      Comments: 1.         Clean R medial foot with Vashe wound cleanser [place] [phone]) and apply Xeroform gauze [place] [phone]) to wound bed daily, cover with Telfa non-stick dressing and silicone foam. Place foot in Prevalon boot.   2.         Clean L lateral malleolus wound with Vashe  wound cleanser, apply Vashe moistened (not saturated) gauze to wound bed twice daily.  Cover with dry gauze and silicone foam dressing. Place foot in Prevalon boot.  02/23/23 0801    02/23/23 0802    Wound care  Daily      Comments:  Clean Unstageable PI to L hip and L medial knee with Vashe wound cleanser, apply Medihoney to wound beds daily, cover with dry gauze and silicone foam. May lift foam daily to reapply Medihoney.  Change foam dressing q3 days and prn soiling.   02/27/23 1402           Allergies  Allergen Reactions   Aspirin     Can not take due to previous GI bleed - per daughter    Follow-up Information     Lorenda Ishihara, MD. Schedule an appointment as soon as possible for a visit in 2 week(s).   Specialty: Internal Medicine Why: Follow-up in 1 to 2 weeks. Contact information: [address] [place] [address] [phone]         [facility] Follow up.   Why: for home health services Contact information: [address] [place] [address] [phone]                  The results of significant diagnostics from this hospitalization (including imaging, microbiology, ancillary and laboratory) are listed  below for reference.    Significant Diagnostic Studies: DG CHEST PORT 1 VIEW  Result Date: 02/24/2023 CLINICAL DATA:  Shortness of breath.  Sepsis. EXAM: PORTABLE CHEST 1 VIEW COMPARISON:  Radiograph yesterday.  CT 02/22/2023, PET CT 10/13/2022 FINDINGS: Stable heart size and mediastinal contours. Bilateral masslike opacities in the upper lobes. These have been previously characterized. Improving background interstitial opacities. Suspect developing small left pleural effusion. No pneumothorax. IMPRESSION: 1. Improving diffuse interstitial opacities, may represent improving edema or infection. 2. Bilateral masslike opacities in the upper lobes. These have been previously characterized on CT and PET. 3. Suspect developing small left pleural effusion. Electronically Signed   By: Narda Rutherford M.D.   On: 02/24/2023 11:30   MR ANKLE LEFT WO CONTRAST  Result Date: 02/23/2023 CLINICAL DATA:  Lateral ankle wound. Bone mass or bone pain, ankle, aggressive features on x-ray. EXAM: MRI OF THE LEFT ANKLE WITHOUT CONTRAST TECHNIQUE: Multiplanar, multisequence MR imaging of the ankle was performed. No intravenous contrast was administered. COMPARISON:  None recent. Left foot radiographs 06/27/2021. Left ankle radiographs 08/09/2020. FINDINGS: Despite efforts by the technologist and patient, moderate to severe motion artifact is present on today's exam and could not be eliminated. This reduces exam sensitivity and specificity. Patient was unable to complete the examination. No postcontrast imaging obtained. Bones/Joint/Cartilage No evidence of acute fracture, dislocation or bone destruction. No significant joint effusions or arthropathic changes identified. No aggressive osseous lesions are seen. Ligaments Ligamentous assessment limited by motion. No ligamentous abnormalities are identified. Muscles and Tendons The medial flexor, anterior extensor, peroneal and Achilles tendons appear intact. No evidence of  significant tenosynovitis or focal muscular abnormality. Generalized muscular atrophy noted. The plantar fascia is intact. Soft tissue Soft tissue assessment limited by motion. There appears to be mild subcutaneous edema lateral to the distal fibula and in the lateral hindfoot, although no focal fluid collection or discrete soft tissue ulcer identified. IMPRESSION: 1. Significantly motion degraded examination. 2. No evidence of osteomyelitis, septic arthritis or soft tissue abscess. 3. Mild nonspecific subcutaneous edema lateral to the distal fibula and in the lateral hindfoot. 4. If further evaluation of suspected soft tissue infection is warranted clinically, consider CT with contrast which may be better tolerated by the patient. Radiographic correlation recommended regarding the possible bone lesion in the provided history. Electronically Signed   By: Carey Bullocks  M.D.   On: 02/23/2023 15:59   DG CHEST PORT 1 VIEW  Result Date: 02/23/2023 CLINICAL DATA:  Shortness of breath EXAM: PORTABLE CHEST 1 VIEW COMPARISON:  X-ray and CT angiogram 02/22/2023 FINDINGS: Overlapping cardiac leads. Stable cardiopericardial silhouette with calcified aorta. Film is less well inflated today compared to prior but there appears to be increasing interstitial and hazy lung opacities bilaterally. Mass lesions are again seen in the upper lung zones. No pneumothorax or effusion. IMPRESSION: Worsening inflation with increasing interstitial and hazy lung opacities. Recommend follow up Electronically Signed   By: Karen Kays M.D.   On: 02/23/2023 10:38   CT Angio Chest PE W and/or Wo Contrast  Addendum Date: 02/22/2023   ADDENDUM REPORT: 02/22/2023 20:52 ADDENDUM: Ovoid filling defect measuring 1.6 by 1.2 cm within the trachea, series 7, image 42, this could represent debris though attention on follow-up imaging to exclude tracheal mass. Electronically Signed   By: Jasmine Pang M.D.   On: 02/22/2023 20:52   Result Date:  02/22/2023 CLINICAL DATA:  Altered history of lung cancer EXAM: CT ANGIOGRAPHY CHEST WITH CONTRAST TECHNIQUE: Multidetector CT imaging of the chest was performed using the standard protocol during bolus administration of intravenous contrast. Multiplanar CT image reconstructions and MIPs were obtained to evaluate the vascular anatomy. RADIATION DOSE REDUCTION: This exam was performed according to the departmental dose-optimization program which includes automated exposure control, adjustment of the mA and/or kV according to patient size and/or use of iterative reconstruction technique. CONTRAST:  75mL OMNIPAQUE IOHEXOL 350 MG/ML SOLN COMPARISON:  CT 10/13/2022, 09/22/2022 FINDINGS: Cardiovascular: Satisfactory opacification of the pulmonary arteries to the segmental level. No evidence of pulmonary embolism. Moderate aortic atherosclerosis. No aneurysm or dissection. Coronary vascular calcification. Normal cardiac size. No pericardial effusion Mediastinum/Nodes: Midline trachea. No thyroid mass. Esophagus within normal limits. Small right hilar nodes measuring up to 10 mm, no change. Confluent left hilar nodes as before. Lungs/Pleura: Areas of bronchiectasis and subpleural reticulation consistent with fibrosis. Occluded left upper lobe bronchi. Redemonstrated multiple bilateral spiculated pulmonary masses. Dominant right upper lobe spiculated pulmonary mass measures 4.2 x 3.1 cm, previously 3.5 x 3.2 cm. Large left upper lobe spiculated pulmonary mass on series 8, image 59, this measures 5.8 by 3.3 cm, previously 7.1 x 4.5 cm. Left lower lobe pulmonary mass measuring 2.4 by 1.8 cm on series 8, image 64, previously 3.1 x 2.3 cm. No pleural effusion or pneumothorax. Mild ground-glass density within the left upper lobe and bilateral lower lobes compared to prior chest CT. Upper Abdomen: No acute finding. Musculoskeletal: Chronic compression deformity at L1. No acute osseous abnormality. Review of the MIP images confirms  the above findings. IMPRESSION: 1. Negative for acute pulmonary embolus. 2. Redemonstrated multiple bilateral spiculated pulmonary masses. Dominant right upper lobe spiculated mass is slightly increased in size. Dominant left upper lobe spiculated mass is decreased in size. Left lower lobe spiculated mass is decreased in size. Confluent left hilar adenopathy redemonstrated. 3. Interval mild hazy pulmonary density in the left upper and bilateral lower lobes suggesting superimposed infectious or inflammatory process. Evidence for underlying chronic lung disease with areas of bronchiectasis and subpleural fibrosis 4. Aortic atherosclerosis. Aortic Atherosclerosis (ICD10-I70.0). Electronically Signed: By: Jasmine Pang M.D. On: 02/22/2023 19:50   CT HEAD WO CONTRAST  Result Date: 02/22/2023 CLINICAL DATA:  Altered level of consciousness EXAM: CT HEAD WITHOUT CONTRAST TECHNIQUE: Contiguous axial images were obtained from the base of the skull through the vertex without intravenous contrast. RADIATION DOSE REDUCTION: This exam  was performed according to the departmental dose-optimization program which includes automated exposure control, adjustment of the mA and/or kV according to patient size and/or use of iterative reconstruction technique. COMPARISON:  09/22/2022 FINDINGS: Brain: Confluent hypodensities throughout the periventricular white matter consistent with chronic small vessel ischemic changes. There is diffuse cerebral atrophy, age advanced. No evidence of acute infarct or hemorrhage. Stable ex vacuo dilatation of the lateral ventricles. Midline structures are unremarkable. No acute extra-axial fluid collections. No mass effect. Vascular: Stable atherosclerosis.  No hyperdense vessel. Skull: Stable sclerotic region within the right frontal region of the calvarium, likely benign given stability since 2022. No acute or destructive bony abnormality. Sinuses/Orbits: No acute finding. Other: None. IMPRESSION: 1.  No acute intracranial process. Stable diffuse cerebral atrophy and chronic small vessel ischemic changes as above. Electronically Signed   By: Sharlet Salina M.D.   On: 02/22/2023 19:21   DG Chest Portable 1 View  Result Date: 02/22/2023 CLINICAL DATA:  Altered level of consciousness, history of lung cancer, prior tobacco abuse EXAM: PORTABLE CHEST 1 VIEW COMPARISON:  10/27/2022, 10/13/2022 FINDINGS: Single frontal view of the chest demonstrates a stable cardiac silhouette. Bilateral pulmonary masses again noted consistent with history of lung cancer. Since the prior x-ray, the left perihilar mass has decreased in size, but the right apical mass appears slightly larger. Severe background emphysema is again noted. Increased ground-glass opacities within the lung bases, which could reflect aspiration, edema, or infection. No effusion or pneumothorax. No acute bony abnormalities. IMPRESSION: 1. Bilateral pulmonary masses consistent with history of lung cancer. The left perihilar mass is smaller, but the right apical mass has increased in size since prior study, compatible with mixed response to therapy. 2. Severe emphysema, with increased basilar predominant ground-glass airspace disease. This could reflect superimposed edema, infection, or aspiration. Electronically Signed   By: Sharlet Salina M.D.   On: 02/22/2023 16:02    Microbiology: Recent Results (from the past 240 hour(s))  SARS Coronavirus 2 by RT PCR (hospital order, performed in [facility] hospital lab) *cepheid single result test* Anterior Nasal Swab     Status: None   Collection Time: 02/22/23  3:05 PM   Specimen: Anterior Nasal Swab  Result Value Ref Range Status   SARS Coronavirus 2 by RT PCR NEGATIVE NEGATIVE Final    Comment: Performed at [facility], [address] [place], [place] [address]  Blood Culture (Routine X 2)     Status: None   Collection Time: 02/22/23  6:00 PM   Specimen: BLOOD RIGHT WRIST  Result Value Ref Range  Status   Specimen Description BLOOD RIGHT WRIST  Final   Special Requests   Final    BOTTLES DRAWN AEROBIC AND ANAEROBIC Blood Culture results may not be optimal due to an inadequate volume of blood received in culture bottles   Culture   Final    NO GROWTH 5 DAYS Performed at [facility], [address], [place], [place] [address]    Report Status 02/27/2023 FINAL  Final  Blood Culture (Routine X 2)     Status: None   Collection Time: 02/22/23  6:10 PM   Specimen: BLOOD LEFT FOREARM  Result Value Ref Range Status   Specimen Description BLOOD LEFT FOREARM  Final   Special Requests   Final    BOTTLES DRAWN AEROBIC AND ANAEROBIC Blood Culture results may not be optimal due to an inadequate volume of blood received in culture bottles   Culture  Final    NO GROWTH 5 DAYS Performed at [facility], [address], [place], [place] [address]    Report Status 02/27/2023 FINAL  Final     Labs: Basic Metabolic Panel: Recent Labs  Lab 02/23/23 1013 02/23/23 2357 02/24/23 1144 02/24/23 2319 02/26/23 0337 02/27/23 0331  NA 143 141 141 139 137 137  K 3.6 3.5 3.5 3.8 3.5 3.9  CL 111 105 108 107 105 105  CO2 26 25 26 22 25 27   GLUCOSE 91 88 141* 144* 161* 178*  BUN 12 10 8 10 8 8   CREATININE 0.75 0.76 0.73 0.74 0.75 0.73  CALCIUM 8.4* 8.3* 8.3* 7.9* 7.8* 8.2*  MG 1.9 1.8 1.9 1.8 2.1  --   PHOS 2.4* 2.4* 2.2* 2.5 2.9 2.5   Liver Function Tests: Recent Labs  Lab 02/22/23 1800 02/23/23 1013 02/23/23 2357 02/24/23 1144 02/24/23 2319 02/26/23 0337 02/27/23 0331  AST 15 15 13*  --   --   --   --   ALT 18 17 16   --   --   --   --   ALKPHOS 74 58 53  --   --   --   --   BILITOT 0.4 0.4 0.3  --   --   --   --   PROT 7.7 7.0 6.3*  --   --   --   --   ALBUMIN 2.6* 2.3* 2.1* 2.3* 1.7* 1.8* 2.0*   No results for input(s): "LIPASE", "AMYLASE" in the last 168 hours. Recent Labs  Lab 02/22/23 1800  AMMONIA 18   CBC: Recent Labs  Lab 02/22/23 1800 02/23/23 1013  02/23/23 2357 02/24/23 1144 02/24/23 2319 02/25/23 0947 02/26/23 0337 02/27/23 0331  WBC 12.7* 10.2 10.3 10.5 8.5  --  8.6 10.0  NEUTROABS 9.9* 8.0* 7.9* 8.3* 6.5  --   --   --   HGB 8.8* 7.9* 7.2* 7.3* 7.0* 8.1* 7.4* 7.8*  HCT 28.7* 25.9* 23.0* 23.1* 22.4* 26.0* 23.4* 24.8*  MCV 82.0 80.7 79.6* 80.8 79.2*  --  79.9* 78.2*  PLT 283 261 245 307 241  --  277 292   Cardiac Enzymes: No results for input(s): "CKTOTAL", "CKMB", "CKMBINDEX", "TROPONINI" in the last 168 hours. BNP: BNP (last 3 results) Recent Labs    09/23/22 0400  BNP 38.6    ProBNP (last 3 results) No results for input(s): "PROBNP" in the last 8760 hours.  CBG: Recent Labs  Lab 02/26/23 1142 02/26/23 1550 02/26/23 2040 02/27/23 0730 02/27/23 1243  GLUCAP 206* 185* 141* 178* 135*       Signed:  Ramiro Harvest MD.  [facility] 02/27/2023, 2:49 PM

## 2023-02-27 NOTE — TOC Transition Note (Signed)
Transition of Care [facility]) - CM/SW Discharge Note   Patient Details  Name: Crystal Moore MRN: [id number] Date of Birth: 1946-02-09  Transition of Care [facility]) CM/SW Contact:  Lawerance Sabal, RN Phone Number: 02/27/2023, 2:41 PM   Clinical Narrative:     Notified [facility] that patient will DC today. No other TOC needs identified for DC   Final next level of care: Home w Home Health Services Barriers to Discharge: Continued Medical Work up   Patient Goals and CMS Choice CMS Medicare.gov Compare Post Acute Care list provided to:: Patient Represenative (must comment) (Daughter) Choice offered to / list presented to : Adult Children  Discharge Placement                         Discharge Plan and Services Additional resources added to the After Visit Summary for       Post Acute Care Choice: Home Health                               Social Determinants of Health (SDOH) Interventions SDOH Screenings   Food Insecurity: No Food Insecurity (02/23/2023)  Housing: Low Risk  (02/23/2023)  Transportation Needs: No Transportation Needs (02/23/2023)  Utilities: Not At Risk (02/23/2023)  Tobacco Use: Medium Risk (02/23/2023)     Readmission Risk Interventions    09/18/2020   12:44 PM  Readmission Risk Prevention Plan  Transportation Screening Complete  PCP or Specialist Appt within 3-5 Days Complete  Social Work Consult for Recovery Care Planning/Counseling Complete  Palliative Care Screening Not Applicable  Medication Review [employer]) Complete

## 2023-03-23 ENCOUNTER — Inpatient Hospital Stay (HOSPITAL_COMMUNITY)
Admission: EM | Admit: 2023-03-23 | Discharge: 2023-04-02 | DRG: 239 | Disposition: A | Discharge status: Home Health | Payer: 59 | Attending: Internal Medicine | Admitting: Internal Medicine

## 2023-03-23 ENCOUNTER — Emergency Department (HOSPITAL_COMMUNITY): Payer: 59

## 2023-03-23 ENCOUNTER — Encounter (HOSPITAL_COMMUNITY): Payer: Self-pay

## 2023-03-23 DIAGNOSIS — Z8616 Personal history of COVID-19: Secondary | ICD-10-CM | POA: Diagnosis not present

## 2023-03-23 DIAGNOSIS — M869 Osteomyelitis, unspecified: Principal | ICD-10-CM

## 2023-03-23 DIAGNOSIS — L97519 Non-pressure chronic ulcer of other part of right foot with unspecified severity: Secondary | ICD-10-CM | POA: Diagnosis present

## 2023-03-23 DIAGNOSIS — L89890 Pressure ulcer of other site, unstageable: Secondary | ICD-10-CM | POA: Diagnosis not present

## 2023-03-23 DIAGNOSIS — E11621 Type 2 diabetes mellitus with foot ulcer: Secondary | ICD-10-CM | POA: Diagnosis not present

## 2023-03-23 DIAGNOSIS — F0393 Unspecified dementia, unspecified severity, with mood disturbance: Secondary | ICD-10-CM | POA: Diagnosis present

## 2023-03-23 DIAGNOSIS — L97509 Non-pressure chronic ulcer of other part of unspecified foot with unspecified severity: Secondary | ICD-10-CM | POA: Diagnosis not present

## 2023-03-23 DIAGNOSIS — E1139 Type 2 diabetes mellitus with other diabetic ophthalmic complication: Secondary | ICD-10-CM | POA: Diagnosis not present

## 2023-03-23 DIAGNOSIS — F0390 Unspecified dementia without behavioral disturbance: Secondary | ICD-10-CM | POA: Diagnosis present

## 2023-03-23 DIAGNOSIS — Z515 Encounter for palliative care: Secondary | ICD-10-CM

## 2023-03-23 DIAGNOSIS — E1140 Type 2 diabetes mellitus with diabetic neuropathy, unspecified: Secondary | ICD-10-CM | POA: Diagnosis present

## 2023-03-23 DIAGNOSIS — Z8673 Personal history of transient ischemic attack (TIA), and cerebral infarction without residual deficits: Secondary | ICD-10-CM

## 2023-03-23 DIAGNOSIS — I6523 Occlusion and stenosis of bilateral carotid arteries: Secondary | ICD-10-CM | POA: Diagnosis not present

## 2023-03-23 DIAGNOSIS — J9611 Chronic respiratory failure with hypoxia: Secondary | ICD-10-CM | POA: Diagnosis not present

## 2023-03-23 DIAGNOSIS — I70291 Other atherosclerosis of native arteries of extremities, right leg: Secondary | ICD-10-CM | POA: Diagnosis not present

## 2023-03-23 DIAGNOSIS — Z886 Allergy status to analgesic agent status: Secondary | ICD-10-CM

## 2023-03-23 DIAGNOSIS — M86171 Other acute osteomyelitis, right ankle and foot: Secondary | ICD-10-CM | POA: Diagnosis not present

## 2023-03-23 DIAGNOSIS — S91301A Unspecified open wound, right foot, initial encounter: Secondary | ICD-10-CM | POA: Diagnosis not present

## 2023-03-23 DIAGNOSIS — J9601 Acute respiratory failure with hypoxia: Secondary | ICD-10-CM | POA: Diagnosis not present

## 2023-03-23 DIAGNOSIS — R64 Cachexia: Secondary | ICD-10-CM | POA: Diagnosis present

## 2023-03-23 DIAGNOSIS — E1169 Type 2 diabetes mellitus with other specified complication: Secondary | ICD-10-CM | POA: Diagnosis present

## 2023-03-23 DIAGNOSIS — J432 Centrilobular emphysema: Secondary | ICD-10-CM | POA: Diagnosis not present

## 2023-03-23 DIAGNOSIS — Z87891 Personal history of nicotine dependence: Secondary | ICD-10-CM

## 2023-03-23 DIAGNOSIS — Z7902 Long term (current) use of antithrombotics/antiplatelets: Secondary | ICD-10-CM

## 2023-03-23 DIAGNOSIS — L89210 Pressure ulcer of right hip, unstageable: Secondary | ICD-10-CM | POA: Diagnosis not present

## 2023-03-23 DIAGNOSIS — Z7401 Bed confinement status: Secondary | ICD-10-CM

## 2023-03-23 DIAGNOSIS — M7989 Other specified soft tissue disorders: Secondary | ICD-10-CM | POA: Diagnosis not present

## 2023-03-23 DIAGNOSIS — J849 Interstitial pulmonary disease, unspecified: Secondary | ICD-10-CM | POA: Diagnosis not present

## 2023-03-23 DIAGNOSIS — L8990 Pressure ulcer of unspecified site, unspecified stage: Secondary | ICD-10-CM | POA: Diagnosis present

## 2023-03-23 DIAGNOSIS — Z6824 Body mass index (BMI) 24.0-24.9, adult: Secondary | ICD-10-CM

## 2023-03-23 DIAGNOSIS — E1152 Type 2 diabetes mellitus with diabetic peripheral angiopathy with gangrene: Principal | ICD-10-CM | POA: Diagnosis present

## 2023-03-23 DIAGNOSIS — E1165 Type 2 diabetes mellitus with hyperglycemia: Secondary | ICD-10-CM | POA: Diagnosis present

## 2023-03-23 DIAGNOSIS — G928 Other toxic encephalopathy: Secondary | ICD-10-CM | POA: Diagnosis not present

## 2023-03-23 DIAGNOSIS — Z7189 Other specified counseling: Secondary | ICD-10-CM | POA: Diagnosis not present

## 2023-03-23 DIAGNOSIS — Z993 Dependence on wheelchair: Secondary | ICD-10-CM

## 2023-03-23 DIAGNOSIS — F32A Depression, unspecified: Secondary | ICD-10-CM | POA: Diagnosis present

## 2023-03-23 DIAGNOSIS — I6350 Cerebral infarction due to unspecified occlusion or stenosis of unspecified cerebral artery: Secondary | ICD-10-CM | POA: Diagnosis not present

## 2023-03-23 DIAGNOSIS — Z85118 Personal history of other malignant neoplasm of bronchus and lung: Secondary | ICD-10-CM

## 2023-03-23 DIAGNOSIS — I639 Cerebral infarction, unspecified: Secondary | ICD-10-CM | POA: Diagnosis not present

## 2023-03-23 DIAGNOSIS — I70261 Atherosclerosis of native arteries of extremities with gangrene, right leg: Secondary | ICD-10-CM | POA: Diagnosis present

## 2023-03-23 DIAGNOSIS — E440 Moderate protein-calorie malnutrition: Secondary | ICD-10-CM | POA: Diagnosis present

## 2023-03-23 DIAGNOSIS — E11628 Type 2 diabetes mellitus with other skin complications: Secondary | ICD-10-CM | POA: Diagnosis not present

## 2023-03-23 DIAGNOSIS — I7090 Unspecified atherosclerosis: Secondary | ICD-10-CM | POA: Diagnosis not present

## 2023-03-23 DIAGNOSIS — I10 Essential (primary) hypertension: Secondary | ICD-10-CM | POA: Diagnosis not present

## 2023-03-23 DIAGNOSIS — Z833 Family history of diabetes mellitus: Secondary | ICD-10-CM

## 2023-03-23 DIAGNOSIS — I69854 Hemiplegia and hemiparesis following other cerebrovascular disease affecting left non-dominant side: Secondary | ICD-10-CM

## 2023-03-23 DIAGNOSIS — L03115 Cellulitis of right lower limb: Secondary | ICD-10-CM | POA: Diagnosis not present

## 2023-03-23 DIAGNOSIS — T424X5A Adverse effect of benzodiazepines, initial encounter: Secondary | ICD-10-CM | POA: Diagnosis not present

## 2023-03-23 DIAGNOSIS — M86161 Other acute osteomyelitis, right tibia and fibula: Secondary | ICD-10-CM | POA: Diagnosis not present

## 2023-03-23 DIAGNOSIS — Z7984 Long term (current) use of oral hypoglycemic drugs: Secondary | ICD-10-CM

## 2023-03-23 DIAGNOSIS — E785 Hyperlipidemia, unspecified: Secondary | ICD-10-CM | POA: Diagnosis not present

## 2023-03-23 DIAGNOSIS — Z79899 Other long term (current) drug therapy: Secondary | ICD-10-CM

## 2023-03-23 DIAGNOSIS — I69354 Hemiplegia and hemiparesis following cerebral infarction affecting left non-dominant side: Secondary | ICD-10-CM

## 2023-03-23 DIAGNOSIS — I70221 Atherosclerosis of native arteries of extremities with rest pain, right leg: Secondary | ICD-10-CM | POA: Diagnosis not present

## 2023-03-23 DIAGNOSIS — L89223 Pressure ulcer of left hip, stage 3: Secondary | ICD-10-CM | POA: Diagnosis not present

## 2023-03-23 DIAGNOSIS — M85871 Other specified disorders of bone density and structure, right ankle and foot: Secondary | ICD-10-CM | POA: Diagnosis not present

## 2023-03-23 DIAGNOSIS — J449 Chronic obstructive pulmonary disease, unspecified: Secondary | ICD-10-CM | POA: Diagnosis not present

## 2023-03-23 DIAGNOSIS — I6782 Cerebral ischemia: Secondary | ICD-10-CM | POA: Diagnosis not present

## 2023-03-23 DIAGNOSIS — I672 Cerebral atherosclerosis: Secondary | ICD-10-CM | POA: Diagnosis not present

## 2023-03-23 DIAGNOSIS — E1151 Type 2 diabetes mellitus with diabetic peripheral angiopathy without gangrene: Secondary | ICD-10-CM | POA: Diagnosis not present

## 2023-03-23 DIAGNOSIS — Z794 Long term (current) use of insulin: Secondary | ICD-10-CM | POA: Diagnosis not present

## 2023-03-23 DIAGNOSIS — L89899 Pressure ulcer of other site, unspecified stage: Secondary | ICD-10-CM | POA: Diagnosis not present

## 2023-03-23 DIAGNOSIS — R531 Weakness: Secondary | ICD-10-CM | POA: Diagnosis not present

## 2023-03-23 DIAGNOSIS — K219 Gastro-esophageal reflux disease without esophagitis: Secondary | ICD-10-CM | POA: Diagnosis present

## 2023-03-23 LAB — COMPREHENSIVE METABOLIC PANEL
ALT: 15 U/L (ref 0–44)
AST: 14 U/L — ABNORMAL LOW (ref 15–41)
Albumin: 2.6 g/dL — ABNORMAL LOW (ref 3.5–5.0)
Alkaline Phosphatase: 82 U/L (ref 38–126)
Anion gap: 7 (ref 5–15)
BUN: 27 mg/dL — ABNORMAL HIGH (ref 8–23)
CO2: 27 mmol/L (ref 22–32)
Calcium: 8.9 mg/dL (ref 8.9–10.3)
Chloride: 104 mmol/L (ref 98–111)
Creatinine, Ser: 0.85 mg/dL (ref 0.44–1.00)
GFR, Estimated: >60 mL/min (ref >60)
Glucose, Bld: 213 mg/dL — ABNORMAL HIGH (ref 70–99)
Potassium: 4 mmol/L (ref 3.5–5.1)
Sodium: 138 mmol/L (ref 135–145)
Total Bilirubin: 0.2 mg/dL — ABNORMAL LOW (ref 0.3–1.2)
Total Protein: 7.4 g/dL (ref 6.5–8.1)

## 2023-03-23 LAB — CBC WITH DIFFERENTIAL/PLATELET
Abs Immature Granulocytes: 0.03 10*3/uL (ref 0.00–0.07)
Basophils Absolute: 0 10*3/uL (ref 0.0–0.1)
Basophils Relative: 0 %
Eosinophils Absolute: 0.2 10*3/uL (ref 0.0–0.5)
Eosinophils Relative: 3 %
HCT: 30.4 % — ABNORMAL LOW (ref 36.0–46.0)
Hemoglobin: 9.3 g/dL — ABNORMAL LOW (ref 12.0–15.0)
Immature Granulocytes: 0 %
Lymphocytes Relative: 16 %
Lymphs Abs: 1.1 10*3/uL (ref 0.7–4.0)
MCH: 24.2 pg — ABNORMAL LOW (ref 26.0–34.0)
MCHC: 30.6 g/dL (ref 30.0–36.0)
MCV: 79.2 fL — ABNORMAL LOW (ref 80.0–100.0)
Monocytes Absolute: 0.5 10*3/uL (ref 0.1–1.0)
Monocytes Relative: 7 %
Neutro Abs: 5.1 10*3/uL (ref 1.7–7.7)
Neutrophils Relative %: 74 %
Platelets: 278 10*3/uL (ref 150–400)
RBC: 3.84 MIL/uL — ABNORMAL LOW (ref 3.87–5.11)
RDW: 17.3 % — ABNORMAL HIGH (ref 11.5–15.5)
WBC: 6.9 10*3/uL (ref 4.0–10.5)
nRBC: 0 % (ref 0.0–0.2)

## 2023-03-23 LAB — CBG MONITORING, ED: Glucose-Capillary: 162 mg/dL — ABNORMAL HIGH (ref 70–99)

## 2023-03-23 LAB — CG4 I-STAT (LACTIC ACID): Lactic Acid, Venous: 1 mmol/L (ref 0.5–1.9)

## 2023-03-23 MED ORDER — ACETAMINOPHEN 325 MG PO TABS
650.0000 mg | ORAL_TABLET | Freq: Four times a day (QID) | ORAL | Status: DC | PRN
  Administered 2023-03-24 – 2023-03-30 (×2): 650 mg via ORAL
  Filled 2023-03-23 (×2): qty 2

## 2023-03-23 MED ORDER — SODIUM CHLORIDE 0.9 % IV SOLN
2.0000 g | Freq: Once | INTRAVENOUS | Status: AC
  Administered 2023-03-23: 2 g via INTRAVENOUS
  Filled 2023-03-23: qty 12.5

## 2023-03-23 MED ORDER — INSULIN GLARGINE-YFGN 100 UNIT/ML ~~LOC~~ SOLN
10.0000 [IU] | Freq: Every morning | SUBCUTANEOUS | Status: DC
  Administered 2023-03-24 – 2023-03-26 (×3): 10 [IU] via SUBCUTANEOUS
  Filled 2023-03-23 (×5): qty 0.1

## 2023-03-23 MED ORDER — HYDROMORPHONE HCL 1 MG/ML IJ SOLN
0.5000 mg | INTRAMUSCULAR | Status: DC | PRN
  Administered 2023-03-24 – 2023-03-30 (×7): 0.5 mg via INTRAVENOUS
  Filled 2023-03-23 (×8): qty 0.5

## 2023-03-23 MED ORDER — INSULIN ASPART 100 UNIT/ML IJ SOLN
0.0000 [IU] | Freq: Every day | INTRAMUSCULAR | Status: DC
  Administered 2023-03-29: 2 [IU] via SUBCUTANEOUS
  Filled 2023-03-23: qty 0.05

## 2023-03-23 MED ORDER — VANCOMYCIN HCL IN DEXTROSE 1-5 GM/200ML-% IV SOLN
1000.0000 mg | Freq: Once | INTRAVENOUS | Status: AC
  Administered 2023-03-23: 1000 mg via INTRAVENOUS
  Filled 2023-03-23: qty 200

## 2023-03-23 MED ORDER — INSULIN ASPART 100 UNIT/ML IJ SOLN
0.0000 [IU] | Freq: Three times a day (TID) | INTRAMUSCULAR | Status: DC
  Administered 2023-03-24: 2 [IU] via SUBCUTANEOUS
  Administered 2023-03-24 (×2): 3 [IU] via SUBCUTANEOUS
  Administered 2023-03-25: 2 [IU] via SUBCUTANEOUS
  Administered 2023-03-25: 3 [IU] via SUBCUTANEOUS
  Filled 2023-03-23: qty 0.15

## 2023-03-23 MED ORDER — SENNOSIDES-DOCUSATE SODIUM 8.6-50 MG PO TABS: 1.0000 | ORAL_TABLET | Freq: Every evening | ORAL | Status: DC | PRN

## 2023-03-23 MED ORDER — ENOXAPARIN SODIUM 40 MG/0.4ML IJ SOSY
40.0000 mg | PREFILLED_SYRINGE | INTRAMUSCULAR | Status: DC
  Administered 2023-03-25 – 2023-04-02 (×8): 40 mg via SUBCUTANEOUS
  Filled 2023-03-23 (×9): qty 0.4

## 2023-03-23 MED ORDER — ACETAMINOPHEN 650 MG RE SUPP: 650.0000 mg | Freq: Four times a day (QID) | RECTAL | Status: DC | PRN

## 2023-03-23 NOTE — ED Notes (Signed)
ED TO INPATIENT HANDOFF REPORT  ED Nurse Name and Phone #: Linus Orn Name/Age/Gender Crystal Moore 77 y.o. female Room/Bed: WA24/WA24  Code Status   Code Status: Full Code  Home/SNF/Other Home Patient oriented to: self Is this baseline? Yes   Triage Complete: Triage complete  Chief Complaint Osteomyelitis of foot, right, acute (HCC) [M86.171]  Triage Note Pt went to get wounds on right foot checked by pcp as they were getting worse. They did an Xray was concerning for osteomyelitis. Denies any fever or pain to foot. Pt on O2 all the time, 2L Currently going through radiation for lung Ca   Allergies Allergies  Allergen Reactions   Aspirin Other (See Comments)    "Cannot take due to previous GI bleed," per daughter      Level of Care/Admitting Diagnosis ED Disposition     ED Disposition  Admit   Condition  --   Comment  Hospital Area: [facility] [[id number]]  Level of Care: Med-Surg [16]  May admit patient to [facility] or [facility] if equivalent level of care is available:: Yes  Covid Evaluation: Asymptomatic - no recent exposure (last 10 days) testing not required  Diagnosis: Osteomyelitis of foot, right, acute [facility]) [[id number]]  Admitting Physician: Gery Pray [[id number]]  Attending Physician: Gery Pray [[id number]]  Certification:: I certify this patient will need inpatient services for at least 2 midnights  Expected Medical Readiness: 03/25/2023          B Medical/Surgery History Past Medical History:  Diagnosis Date   Abnormal gait    wheelchair bound   Anemia 06/24/2022   IDA   Arthritis    COPD (chronic obstructive pulmonary disease) (HCC)    COVID-19 virus infection 07/2020   CVA (cerebral vascular accident) (HCC) 09/22/2022   with residual left-sided weakness   Dementia (HCC)    Depression    no current problem   Diabetes mellitus without complication (HCC)    GERD (gastroesophageal reflux disease)    Headache     Hemiparesis affecting left side as late effect of cerebrovascular accident (CVA) (HCC)    w/c bound   HOH (hard of hearing) 01/13/2018   no hearing aids   Hypertension    Mass of lung    bilateral masses   PAD (peripheral artery disease) (HCC)    in CE   Pneumonia    x 4   Pyelonephritis    Seizures (HCC) 09/25/2022   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   Sensorineural hearing loss    bilateral in CE, no hearing aids   Stroke [facility])    Tobacco use disorder 2019   quit 2019   UTI (urinary tract infection)    Wheelchair dependent    Past Surgical History:  Procedure Laterality Date   ABDOMINAL AORTOGRAM W/LOWER EXTREMITY N/A 08/30/2020   Procedure: ABDOMINAL AORTOGRAM W/LOWER EXTREMITY;  Surgeon: Leonie Douglas, MD;  Location: [facility] INVASIVE CV LAB;  Service: Cardiovascular;  Laterality: N/A;   ABDOMINAL AORTOGRAM W/LOWER EXTREMITY N/A 09/04/2020   Procedure: ABDOMINAL AORTOGRAM W/LOWER EXTREMITY;  Surgeon: Leonie Douglas, MD;  Location: [facility] INVASIVE CV LAB;  Service: Cardiovascular;  Laterality: N/A;   ESOPHAGOGASTRODUODENOSCOPY (EGD) WITH PROPOFOL N/A 09/16/2020   Procedure: ESOPHAGOGASTRODUODENOSCOPY (EGD) WITH PROPOFOL;  Surgeon: Shellia Cleverly, DO;  Location: [facility] ENDOSCOPY;  Service: Gastroenterology;  Laterality: N/A;   FINE NEEDLE ASPIRATION  10/27/2022   Procedure: FINE NEEDLE ASPIRATION (FNA) LINEAR;  Surgeon: Josephine Igo, DO;  Location: [facility] ENDOSCOPY;  Service: Pulmonary;;   HEMOSTASIS CLIP PLACEMENT  09/16/2020   Procedure: HEMOSTASIS CLIP PLACEMENT;  Surgeon: Shellia Cleverly, DO;  Location: [facility] ENDOSCOPY;  Service: Gastroenterology;;   HOT HEMOSTASIS N/A 09/16/2020   Procedure: HOT HEMOSTASIS (ARGON PLASMA COAGULATION/BICAP);  Surgeon: Shellia Cleverly, DO;  Location: [facility] ENDOSCOPY;  Service: Gastroenterology;  Laterality: N/A;   PERIPHERAL VASCULAR INTERVENTION Right 08/30/2020   Procedure: PERIPHERAL VASCULAR INTERVENTION;  Surgeon: Leonie Douglas, MD;  Location: [facility];  Service: Cardiovascular;  Laterality: Right;  femoral popliteal   PERIPHERAL VASCULAR INTERVENTION Left 09/04/2020   Procedure: PERIPHERAL VASCULAR INTERVENTION;  Surgeon: Leonie Douglas, MD;  Location: [facility];  Service: Cardiovascular;  Laterality: Left;  SFA   TUBAL LIGATION     VIDEO BRONCHOSCOPY WITH ENDOBRONCHIAL ULTRASOUND Bilateral 10/27/2022   Procedure: VIDEO BRONCHOSCOPY WITH ENDOBRONCHIAL ULTRASOUND;  Surgeon: Josephine Igo, DO;  Location: [facility] ENDOSCOPY;  Service: Pulmonary;  Laterality: Bilateral;     A IV Location/Drains/Wounds Patient Lines/Drains/Airways Status     Active Line/Drains/Airways     Name Placement date Placement time Site Days   Peripheral IV 03/23/23 20 G 1.88" Anterior;Right Forearm 03/23/23  2017  Forearm  less than 1   Pressure Injury 02/23/23 Foot Right;Medial Deep Tissue Pressure Injury - Purple or maroon localized area of discolored intact skin or blood-filled blister due to damage of underlying soft tissue from pressure and/or shear. DTPI evolving to full thickness 02/23/23  0700  -- 28   Pressure Injury 02/23/23 Ankle Left;Lateral Stage 3 -  Full thickness tissue loss. Subcutaneous fat may be visible but bone, tendon or muscle are NOT exposed. L lateral malleolus 4 cm x 2 cm 02/23/23  0000  -- 28   Pressure Injury 02/23/23 Hip Left;Lateral Unstageable - Full thickness tissue loss in which the base of the injury is covered by slough (yellow, tan, gray, green or brown) and/or eschar (tan, brown or black) in the wound bed. 2 cm x 2 cm 100% black escha 02/23/23  0000  -- 28   Pressure Injury 02/23/23 Knee Left;Lateral 1 cm x 1 cm L lateral knee 100% brown eschar 02/23/23  0805  -- 28   Pressure Injury 02/23/23 Heel Medial;Right Stage 2 -  Partial thickness loss of dermis presenting as a shallow open injury with a red, pink wound bed without slough. 3 cm x 2 cm pink dry 02/23/23  --  -- 28             Intake/Output Last 24 hours No intake or output data in the 24 hours ending 03/23/23 2237  Labs/Imaging Results for orders placed or performed during the hospital encounter of 03/23/23 (from the past 48 hour(s))  Comprehensive metabolic panel     Status: Abnormal   Collection Time: 03/23/23  8:21 PM  Result Value Ref Range   Sodium 138 135 - 145 mmol/L   Potassium 4.0 3.5 - 5.1 mmol/L   Chloride 104 98 - 111 mmol/L   CO2 27 22 - 32 mmol/L   Glucose, Bld 213 (H) 70 - 99 mg/dL    Comment: Glucose reference range applies only to samples taken after fasting for at least 8 hours.   BUN 27 (H) 8 - 23 mg/dL   Creatinine, Ser 1.61 0.44 - 1.00 mg/dL   Calcium 8.9 8.9 - 09.6 mg/dL   Total Protein 7.4 6.5 - 8.1 g/dL   Albumin 2.6 (L) 3.5 - 5.0 g/dL  AST 14 (L) 15 - 41 U/L   ALT 15 0 - 44 U/L   Alkaline Phosphatase 82 38 - 126 U/L   Total Bilirubin 0.2 (L) 0.3 - 1.2 mg/dL   GFR, Estimated >13 >08 mL/min    Comment: (NOTE) Calculated using the CKD-EPI Creatinine Equation (2021)    Anion gap 7 5 - 15    Comment: Performed at [facility], [address], [place], [place] [address]  CBC with Differential     Status: Abnormal   Collection Time: 03/23/23  8:21 PM  Result Value Ref Range   WBC 6.9 4.0 - 10.5 K/uL   RBC 3.84 (L) 3.87 - 5.11 MIL/uL   Hemoglobin 9.3 (L) 12.0 - 15.0 g/dL   HCT 69.6 (L) 29.5 - 28.4 %   MCV 79.2 (L) 80.0 - 100.0 fL   MCH 24.2 (L) 26.0 - 34.0 pg   MCHC 30.6 30.0 - 36.0 g/dL   RDW 13.2 (H) 44.0 - 10.2 %   Platelets 278 150 - 400 K/uL   nRBC 0.0 0.0 - 0.2 %   Neutrophils Relative % 74 %   Neutro Abs 5.1 1.7 - 7.7 K/uL   Lymphocytes Relative 16 %   Lymphs Abs 1.1 0.7 - 4.0 K/uL   Monocytes Relative 7 %   Monocytes Absolute 0.5 0.1 - 1.0 K/uL   Eosinophils Relative 3 %   Eosinophils Absolute 0.2 0.0 - 0.5 K/uL   Basophils Relative 0 %   Basophils Absolute 0.0 0.0 - 0.1 K/uL   Immature Granulocytes 0 %   Abs Immature Granulocytes 0.03  0.00 - 0.07 K/uL    Comment: Performed at [facility], [address] [place], [place] [address]   DG Foot Complete Right  Result Date: 03/23/2023 CLINICAL DATA:  Right foot wound, osteomyelitis EXAM: RIGHT FOOT COMPLETE - 3+ VIEW COMPARISON:  03/23/2023 at 1:57 p.m. FINDINGS: Frontal, oblique, lateral views of the right foot are obtained. The soft tissue defect at the base of the first digit is again identified, with erosive changes of the first metatarsal head and base of the first proximal phalanx consistent with osteomyelitis. No change since exam performed earlier today. Bones are osteopenic. Multifocal osteoarthritis unchanged. Diffuse soft tissue swelling. IMPRESSION: 1. Osteomyelitis involving the head of the first metatarsal and base of the first proximal phalanx, with overlying soft tissue defect. No change since the exam performed earlier today. Electronically Signed   By: Sharlet Salina M.D.   On: 03/23/2023 17:39    Pending Labs Unresulted Labs (From admission, onward)     Start     Ordered   03/30/23 0500  Creatinine, serum  (enoxaparin (LOVENOX)    CrCl >/= 30 ml/min)  Weekly,   R     Comments: while on enoxaparin therapy   Question:  Specimen collection method  Answer:  IV Team=IV Team collect   03/23/23 2209   03/24/23 0500  Basic metabolic panel  Tomorrow morning,   R       Question:  Specimen collection method  Answer:  IV Team=IV Team collect   03/23/23 2209   03/24/23 0500  Magnesium  Tomorrow morning,   R       Question:  Specimen collection method  Answer:  IV Team=IV Team collect   03/23/23 2209   03/24/23 0500  CBC with Differential/Platelet  Tomorrow morning,   R       Question:  Specimen collection method  Answer:  IV  Team=IV Team collect   03/23/23 2209   03/23/23 1643  Blood Cultures x 2 sites  BLOOD CULTURE X 2,   STAT      03/23/23 1642            Vitals/Pain Today's Vitals   03/23/23 1625 03/23/23 1641 03/23/23 1925 03/23/23  2114  BP: 136/63  123/62   Pulse: (!) 106  84   Resp: 16  16   Temp: 97.8 F (36.6 C)   98.4 F (36.9 C)  TempSrc: Oral   Oral  SpO2: 98%  99%   Weight:  60.6 kg    Height:  5\' 2"  (1.575 m)    PainSc:  0-No pain      Isolation Precautions No active isolations  Medications Medications  insulin aspart (novoLOG) injection 0-15 Units (has no administration in time range)  insulin aspart (novoLOG) injection 0-5 Units (has no administration in time range)  enoxaparin (LOVENOX) injection 40 mg (has no administration in time range)  acetaminophen (TYLENOL) tablet 650 mg (has no administration in time range)    Or  acetaminophen (TYLENOL) suppository 650 mg (has no administration in time range)  senna-docusate (Senokot-S) tablet 1 tablet (has no administration in time range)  HYDROmorphone (DILAUDID) injection 0.5 mg (has no administration in time range)  vancomycin (VANCOCIN) IVPB 1000 mg/200 mL premix (1,000 mg Intravenous New Bag/Given 03/23/23 2113)  ceFEPIme (MAXIPIME) 2 g in sodium chloride 0.9 % 100 mL IVPB (0 g Intravenous Stopped 03/23/23 2114)    Mobility manual wheelchair     Focused Assessments Wound   R Recommendations: See Admitting Provider Note  Report given to:   Additional Notes: non-verbal, pt can nob head yes or no. Non-ambulatory.

## 2023-03-23 NOTE — Progress Notes (Signed)
A consult was received from an ED physician for Vancomycin and Cefepime per pharmacy dosing (for an indication other than meningitis). The patient's profile has been reviewed for ht/wt/allergies/indication/available labs. A one time order has been placed for the above antibiotics.  Further antibiotics/pharmacy consults should be ordered by admitting physician if indicated.                       Bernadene Person, PharmD, BCPS [phone] 03/23/2023, 5:28 PM

## 2023-03-23 NOTE — ED Provider Triage Note (Signed)
Emergency Medicine Provider Triage Evaluation Note  Crystal Moore , a 77 y.o. female  was evaluated in triage.  Pt complains of right foot wound. Sent here from PCP. Has known diabetic ulcer on the right foot, reopened on 8/24. Went to PCP today and XR showed findings consistent with osteomyelitis. Pt undergoing radiation treatment for lung cancer.   Review of Systems  Positive: Right foot wound Negative: Fever, chills  Physical Exam  BP 136/63 (BP Location: Left Arm)   Pulse (!) 106   Temp 97.8 F (36.6 C) (Oral)   Resp 16   SpO2 98%  Gen:   Awake, no distress   Resp:  Normal effort  MSK:   Moves extremities without difficulty  Other:  Wounds on bilateral feet are bandaged. On chronic oxygen  Medical Decision Making  Medically screening exam initiated at 4:39 PM.  Appropriate orders placed.  MAISON KUHNS was informed that the remainder of the evaluation will be completed by another provider, this initial triage assessment does not replace that evaluation, and the importance of remaining in the ED until their evaluation is complete.  Workup initiated including lactic and blood cultures Cannot view XR from [facility], will repeat   Tyriq Moragne T, PA-C 03/23/23 1642

## 2023-03-23 NOTE — ED Triage Notes (Signed)
Pt went to get wounds on right foot checked by pcp as they were getting worse. They did an Xray was concerning for osteomyelitis. Denies any fever or pain to foot. Pt on O2 all the time, 2L Currently going through radiation for lung Ca

## 2023-03-23 NOTE — H&P (Incomplete)
PCP:   Lorenda Ishihara, MD   Chief Complaint:  Concern for osteomyelitis  HPI: This is a 77 year old female with past medical history of Stage IV non-small cell lung cancer on palliative radiation, CVA with left-sided residual deficit, interstitial lung disease with chronic hypoxemia on 2 L, T2DM, PVD, gastric AVMs, COPD, dementia, chronic  diabetic foot ulcers.  Patient sent in by PCP with concern of osteomyelitis right foot.  X-ray done outpatient concerning for osteomyelitis RLE.  Per PCP patient has a wound on the medial aspect of the right great toe at the MTP joint.  The wound reopened a few weeks ago.  In the ED vital stable, afebrile.  WBC 6.9, glucose 213.  Right foot CXR shows osteomyelitis involving the head of the first metatarsal and base of the first proximal phalanx, with overlying soft tissue defect.   Review of Systems:  Unable to obtain   Past Medical History: Past Medical History:  Diagnosis Date   Abnormal gait    wheelchair bound   Anemia 06/24/2022   IDA   Arthritis    COPD (chronic obstructive pulmonary disease) (HCC)    COVID-19 virus infection 07/2020   CVA (cerebral vascular accident) (HCC) 09/22/2022   with residual left-sided weakness   Dementia (HCC)    Depression    no current problem   Diabetes mellitus without complication (HCC)    GERD (gastroesophageal reflux disease)    Headache    Hemiparesis affecting left side as late effect of cerebrovascular accident (CVA) (HCC)    w/c bound   HOH (hard of hearing) 01/13/2018   no hearing aids   Hypertension    Mass of lung    bilateral masses   PAD (peripheral artery disease) (HCC)    in CE   Pneumonia    x 4   Pyelonephritis    Seizures (HCC) 09/25/2022   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   Sensorineural hearing loss    bilateral in CE, no hearing aids   Stroke [facility])    Tobacco use disorder 2019   quit 2019   UTI (urinary tract infection)    Wheelchair  dependent    Past Surgical History:  Procedure Laterality Date   ABDOMINAL AORTOGRAM W/LOWER EXTREMITY N/A 08/30/2020   Procedure: ABDOMINAL AORTOGRAM W/LOWER EXTREMITY;  Surgeon: Leonie Douglas, MD;  Location: [facility];  Service: Cardiovascular;  Laterality: N/A;   ABDOMINAL AORTOGRAM W/LOWER EXTREMITY N/A 09/04/2020   Procedure: ABDOMINAL AORTOGRAM W/LOWER EXTREMITY;  Surgeon: Leonie Douglas, MD;  Location: [facility];  Service: Cardiovascular;  Laterality: N/A;   ESOPHAGOGASTRODUODENOSCOPY (EGD) WITH PROPOFOL N/A 09/16/2020   Procedure: ESOPHAGOGASTRODUODENOSCOPY (EGD) WITH PROPOFOL;  Surgeon: Shellia Cleverly, DO;  Location: [facility] ENDOSCOPY;  Service: Gastroenterology;  Laterality: N/A;   FINE NEEDLE ASPIRATION  10/27/2022   Procedure: FINE NEEDLE ASPIRATION (FNA) LINEAR;  Surgeon: Josephine Igo, DO;  Location: [facility] ENDOSCOPY;  Service: Pulmonary;;   HEMOSTASIS CLIP PLACEMENT  09/16/2020   Procedure: HEMOSTASIS CLIP PLACEMENT;  Surgeon: Shellia Cleverly, DO;  Location: [facility]OS[facility];  Service: Gastroenterology;;   HOT HEMOSTASIS N/A 09/16/2020   Procedure: HOT HEMOSTASIS (ARGON PLASMA COAGULATION/BICAP);  Surgeon: Shellia Cleverly, DO;  Location: [facility] ENDOSCOPY;  Service: Gastroenterology;  Laterality: N/A;   PERIPHERAL VASCULAR INTERVENTION Right 08/30/2020   Procedure: PERIPHERAL VASCULAR INTERVENTION;  Surgeon: Leonie Douglas, MD;  Location: [facility];  Service: Cardiovascular;  Laterality: Right;  femoral popliteal   PERIPHERAL VASCULAR  INTERVENTION Left 09/04/2020   Procedure: PERIPHERAL VASCULAR INTERVENTION;  Surgeon: Leonie Douglas, MD;  Location: [facility];  Service: Cardiovascular;  Laterality: Left;  SFA   TUBAL LIGATION     VIDEO BRONCHOSCOPY WITH ENDOBRONCHIAL ULTRASOUND Bilateral 10/27/2022   Procedure: VIDEO BRONCHOSCOPY WITH ENDOBRONCHIAL ULTRASOUND;  Surgeon: Josephine Igo, DO;  Location: [facility] ENDOS[facility];  Service: Pulmonary;  Laterality:  Bilateral;    Medications: Prior to Admission medications   Medication Sig Start Date End Date Taking? Authorizing Provider  acetaminophen (TYLENOL) 500 MG tablet Take 1,000 mg by mouth every 6 (six) hours as needed (pain).   Yes [provider]  albuterol (VENTOLIN HFA) 108 (90 Base) MCG/ACT inhaler Inhale 2 puffs into the lungs every 6 (six) hours as needed for wheezing or shortness of breath.   Yes [provider]  amLODipine (NORVASC) 5 MG tablet Take 5 mg by mouth in the morning. 05/30/21  Yes [provider]  atorvastatin (LIPITOR) 80 MG tablet Take 1 tablet (80 mg total) by mouth daily at 6 PM. Patient taking differently: Take 80 mg by mouth at bedtime. 12/21/18  Yes Mikhail, Nita Sells, DO  clopidogrel (PLAVIX) 75 MG tablet Take 1 tablet (75 mg total) by mouth daily. 09/24/20  Yes Leonie Douglas, MD  feeding supplement (ENSURE ENLIVE / ENSURE PLUS) LIQD Take 237 mLs by mouth 2 (two) times daily between meals. 06/28/20  Yes Romero Belling, MD  gabapentin (NEURONTIN) 300 MG capsule Take 1 capsule (300 mg total) by mouth 3 (three) times daily. 09/06/20  Yes Kathlen Mody, MD  insulin glargine (LANTUS) 100 UNIT/ML injection Inject 0.1 mLs (10 Units total) into the skin daily. Patient taking differently: Inject 10 Units into the skin in the morning. 02/27/23  Yes Rodolph Bong, MD  linagliptin (TRADJENTA) 5 MG TABS tablet Take 5 mg by mouth daily.   Yes [provider]  pantoprazole (PROTONIX) 40 MG tablet Take 40 mg by mouth 2 (two) times daily.   Yes [provider]  SANTYL 250 UNIT/GM ointment Apply 1 Application topically See admin instructions. Apply as directed to affected areas of both feet daily as directed 10/23/22  Yes [provider]  ACCU-CHEK GUIDE test strip USE TO test AS DIRECTED TWICE DAILY Patient taking differently: 1 each by Other route 2 (two) times daily. 05/23/21   Romero Belling, MD  Accu-Chek Softclix Lancets lancets 1  each by Other route daily. 08/26/22   [provider]  albuterol (PROVENTIL) (2.5 MG/3ML) 0.083% nebulizer solution Inhale 3 mLs (2.5 mg total) into the lungs every 4 (four) hours as needed for wheezing or shortness of breath. Patient not taking: Reported on 03/23/2023 02/27/23   Rodolph Bong, MD  ferrous sulfate 325 (65 FE) MG tablet Take 1 tablet (325 mg total) by mouth daily with breakfast. Patient not taking: Reported on 03/23/2023 09/26/22   Osvaldo Shipper, MD  folic acid (FOLVITE) 1 MG tablet Take 1 tablet (1 mg total) by mouth daily. Patient not taking: Reported on 03/23/2023 09/26/22   Osvaldo Shipper, MD  ipratropium (ATROVENT) 0.02 % nebulizer solution Take 2.5 mLs (0.5 mg total) by nebulization 3 (three) times daily for 5 days. Patient not taking: Reported on 03/23/2023 02/27/23 03/23/23  Rodolph Bong, MD  levalbuterol Pauline Aus) 0.63 MG/3ML nebulizer solution Take 3 mLs (0.63 mg total) by nebulization 3 (three) times daily for 5 days. Patient not taking: Reported on 03/23/2023 02/27/23 03/23/23  Rodolph Bong, MD    Allergies:  Allergies  Allergen Reactions   Aspirin Other (See Comments)    "Cannot take due to previous GI bleed," per daughter      Social History:  reports that she quit smoking about 5 years ago. Her smoking use included cigarettes. She started smoking about 45 years ago. She has a 40 pack-year smoking history. She has never used smokeless tobacco. She reports that she does not drink alcohol and does not use drugs.  Family History: Family History  Problem Relation Age of Onset   Diabetes Mother    Diabetes Sister    Diabetes Brother     Physical Exam: Vitals:   03/23/23 1625 03/23/23 1641 03/23/23 1925 03/23/23 2114  BP: 136/63  123/62   Pulse: (!) 106  84   Resp: 16  16   Temp: 97.8 F (36.6 C)   98.4 F (36.9 C)  TempSrc: Oral   Oral  SpO2: 98%  99%   Weight:  60.6 kg    Height:  5\' 2"  (1.575 m)      General: A&O x3 with  underlying confusion, weak appearing female Eyes: Pink conjunctiva, no scleral icterus ENT: Moist oral mucosa, neck supple, no thyromegaly Lungs: CTA B/L, no wheeze, no crackles, no use of accessory muscles Cardiovascular: RRR, no regurgitation. No carotid bruits, no JVD Abdomen: soft, positive BS, NTND, no organomegaly, not an acute abdomen GU: not examined Neuro: CN II - XII appears grossly intact.  Musculoskeletal: Left upper ext paralysis w/ contraction and LLE paralysis with muscle.  Some spasticity upper extremity Skin: Foul-smelling RLE.  Open wound, moist, erythematous right great toe Psych: Weak patient  Labs on Admission:  Recent Labs    03/23/23 2021  NA 138  K 4.0  CL 104  CO2 27  GLUCOSE 213*  BUN 27*  CREATININE 0.85  CALCIUM 8.9   Recent Labs    03/23/23 2021  AST 14*  ALT 15  ALKPHOS 82  BILITOT 0.2*  PROT 7.4  ALBUMIN 2.6*    Recent Labs    03/23/23 2021  WBC 6.9  NEUTROABS 5.1  HGB 9.3*  HCT 30.4*  MCV 79.2*  PLT 278     Radiological Exams on Admission: DG Foot Complete Right  Result Date: 03/23/2023 CLINICAL DATA:  Right foot wound, osteomyelitis EXAM: RIGHT FOOT COMPLETE - 3+ VIEW COMPARISON:  03/23/2023 at 1:57 p.m. FINDINGS: Frontal, oblique, lateral views of the right foot are obtained. The soft tissue defect at the base of the first digit is again identified, with erosive changes of the first metatarsal head and base of the first proximal phalanx consistent with osteomyelitis. No change since exam performed earlier today. Bones are osteopenic. Multifocal osteoarthritis unchanged. Diffuse soft tissue swelling. IMPRESSION: 1. Osteomyelitis involving the head of the first metatarsal and base of the first proximal phalanx, with overlying soft tissue defect. No change since the exam performed earlier today. Electronically Signed   By: Sharlet Salina M.D.   On: 03/23/2023 17:39    Assessment/Plan Present on Admission:  Osteomyelitis of foot,  right, acute (HCC) -Blood cultures collected -IV Cefepime and Vanco, pharmacy dosing -MRI right foot ordered -Maintained on plavix, last dose 03/23/2023. Held -IV pain meds PRN -Ortho on call consulted by EDP -Wound care consult   COPD (chronic obstructive pulmonary disease) (HCC) Chronic respiratory failure 2L -Stable, oxygen resumed -Nebs PRN ordered   T2DM -Sliding scale inulin initiated -[place] glargine ordered at bedtime, 10 units -Continue Neurontin for peripheral neuropathy   HTN -Norvasc  ordered, stable   HLD -Lipitor resumed   H/o CVA w/ residual L-sidedd weakness/spasticity  Stage IV non-small cell lung cancer, squamous cell carcinoma -palliative chemo  Demenia -underlying dementia   Taelyn Broecker 03/23/2023, 10:40 PM

## 2023-03-23 NOTE — ED Provider Notes (Signed)
[place] EMERGENCY DEPARTMENT AT [facility] Provider Note   CSN: [id number] Arrival date & time: 03/23/23  1554     History  Chief Complaint  Patient presents with   Wound Infection    Crystal Moore is a 77 y.o. female.  HPI  Ms Mohs is a 77 year old female history of T2DM, Lung cancer, chronic foot wound likely from diabetes complications,  who presents from her PCP  due to concerns of an acute flare of  her right foot wound. Patient went to her PCP for due to recent changes to her right wounds and a foul smell. Her primary care physician 20 to come to the ED after a right foot x-ray was concerning for osteomyelitis.Patient has bilateral chronic wounds for  many years ago but doesn't see a podiatrist.   Patient is demented at baseline and is a poor historian. Her daughter and home aid nurse is at bedside. Daughter ( who cleans her patient's wounds periodically) reports that pt's wound has been getting worse after her last radiation for her lung cancer. Pt's home health aid added that the patient has been afebrile but had an some chills.   On exam , pt has a chronic open wet wound on her right toe with surrounding erythema. Wound has a foul smell.           Home Medications Prior to Admission medications   Medication Sig Start Date End Date Taking? Authorizing Provider  acetaminophen (TYLENOL) 500 MG tablet Take 1,000 mg by mouth every 6 (six) hours as needed (pain).   Yes [provider]  albuterol (VENTOLIN HFA) 108 (90 Base) MCG/ACT inhaler Inhale 2 puffs into the lungs every 6 (six) hours as needed for wheezing or shortness of breath.   Yes [provider]  amLODipine (NORVASC) 5 MG tablet Take 5 mg by mouth in the morning. 05/30/21  Yes [provider]  atorvastatin (LIPITOR) 80 MG tablet Take 1 tablet (80 mg total) by mouth daily at 6 PM. Patient taking differently: Take 80 mg by mouth at bedtime. 12/21/18  Yes Mikhail, Nita Sells,  DO  clopidogrel (PLAVIX) 75 MG tablet Take 1 tablet (75 mg total) by mouth daily. 09/24/20  Yes Leonie Douglas, MD  feeding supplement (ENSURE ENLIVE / ENSURE PLUS) LIQD Take 237 mLs by mouth 2 (two) times daily between meals. 06/28/20  Yes Romero Belling, MD  gabapentin (NEURONTIN) 300 MG capsule Take 1 capsule (300 mg total) by mouth 3 (three) times daily. 09/06/20  Yes Kathlen Mody, MD  insulin glargine (LANTUS) 100 UNIT/ML injection Inject 0.1 mLs (10 Units total) into the skin daily. Patient taking differently: Inject 10 Units into the skin in the morning. 02/27/23  Yes Rodolph Bong, MD  linagliptin (TRADJENTA) 5 MG TABS tablet Take 5 mg by mouth daily.   Yes [provider]  pantoprazole (PROTONIX) 40 MG tablet Take 40 mg by mouth 2 (two) times daily.   Yes [provider]  SANTYL 250 UNIT/GM ointment Apply 1 Application topically See admin instructions. Apply as directed to affected areas of both feet daily as directed 10/23/22  Yes [provider]  ACCU-CHEK GUIDE test strip USE TO test AS DIRECTED TWICE DAILY Patient taking differently: 1 each by Other route 2 (two) times daily. 05/23/21   Romero Belling, MD  Accu-Chek Softclix Lancets lancets 1 each by Other route daily. 08/26/22   [provider]  albuterol (PROVENTIL) (2.5 MG/3ML) 0.083% nebulizer solution Inhale  3 mLs (2.5 mg total) into the lungs every 4 (four) hours as needed for wheezing or shortness of breath. Patient not taking: Reported on 03/23/2023 02/27/23   Rodolph Bong, MD  ferrous sulfate 325 (65 FE) MG tablet Take 1 tablet (325 mg total) by mouth daily with breakfast. Patient not taking: Reported on 03/23/2023 09/26/22   Osvaldo Shipper, MD  folic acid (FOLVITE) 1 MG tablet Take 1 tablet (1 mg total) by mouth daily. Patient not taking: Reported on 03/23/2023 09/26/22   Osvaldo Shipper, MD  ipratropium (ATROVENT) 0.02 % nebulizer solution Take 2.5 mLs (0.5 mg total) by nebulization 3 (three)  times daily for 5 days. Patient not taking: Reported on 03/23/2023 02/27/23 03/23/23  Rodolph Bong, MD  levalbuterol Pauline Aus) 0.63 MG/3ML nebulizer solution Take 3 mLs (0.63 mg total) by nebulization 3 (three) times daily for 5 days. Patient not taking: Reported on 03/23/2023 02/27/23 03/23/23  Rodolph Bong, MD      Allergies    Aspirin    Review of Systems   Review of Systems  Physical Exam Updated Vital Signs BP 123/62   Pulse 84   Temp 98.4 F (36.9 C) (Oral)   Resp 16   Ht 5\' 2"  (1.575 m)   Wt 60.6 kg   SpO2 99%   BMI 24.44 kg/m   Physical Exam Constitutional:      Appearance: She is ill-appearing and toxic-appearing.  HENT:     Head: Normocephalic and atraumatic.  Cardiovascular:     Rate and Rhythm: Normal rate and regular rhythm.  Abdominal:     General: Abdomen is flat.     Palpations: Abdomen is soft.  Musculoskeletal:     Comments: chronic open wet wound on her right toe with surrounding erythema. Wound has a foul smell.   Skin:    General: Skin is warm.     Findings: Erythema and lesion present.  Neurological:     Comments: Not talking much.Stares when asked a question      ED Results / Procedures / Treatments   Labs (all labs ordered are listed, but only abnormal results are displayed) Labs Reviewed  COMPREHENSIVE METABOLIC PANEL - Abnormal; Notable for the following components:      Result Value   Glucose, Bld 213 (*)    BUN 27 (*)    Albumin 2.6 (*)    AST 14 (*)    Total Bilirubin 0.2 (*)    All other components within normal limits  CBC WITH DIFFERENTIAL/PLATELET - Abnormal; Notable for the following components:   RBC 3.84 (*)    Hemoglobin 9.3 (*)    HCT 30.4 (*)    MCV 79.2 (*)    MCH 24.2 (*)    RDW 17.3 (*)    All other components within normal limits  CBG MONITORING, ED - Abnormal; Notable for the following components:   Glucose-Capillary 162 (*)    All other components within normal limits  CULTURE, BLOOD (ROUTINE X 2)   CULTURE, BLOOD (ROUTINE X 2)  BASIC METABOLIC PANEL  MAGNESIUM  CBC WITH DIFFERENTIAL/PLATELET  I-STAT CG4 LACTIC ACID, ED    EKG None  Radiology DG Foot Complete Right  Result Date: 03/23/2023 CLINICAL DATA:  Right foot wound, osteomyelitis EXAM: RIGHT FOOT COMPLETE - 3+ VIEW COMPARISON:  03/23/2023 at 1:57 p.m. FINDINGS: Frontal, oblique, lateral views of the right foot are obtained. The soft tissue defect at the base of the first digit is again identified, with erosive changes  of the first metatarsal head and base of the first proximal phalanx consistent with osteomyelitis. No change since exam performed earlier today. Bones are osteopenic. Multifocal osteoarthritis unchanged. Diffuse soft tissue swelling. IMPRESSION: 1. Osteomyelitis involving the head of the first metatarsal and base of the first proximal phalanx, with overlying soft tissue defect. No change since the exam performed earlier today. Electronically Signed   By: Sharlet Salina M.D.   On: 03/23/2023 17:39    Procedures Procedures    Medications Ordered in ED Medications  insulin aspart (novoLOG) injection 0-15 Units (has no administration in time range)  insulin aspart (novoLOG) injection 0-5 Units ( Subcutaneous Not Given 03/23/23 2243)  enoxaparin (LOVENOX) injection 40 mg (has no administration in time range)  acetaminophen (TYLENOL) tablet 650 mg (has no administration in time range)    Or  acetaminophen (TYLENOL) suppository 650 mg (has no administration in time range)  senna-docusate (Senokot-S) tablet 1 tablet (has no administration in time range)  HYDROmorphone (DILAUDID) injection 0.5 mg (has no administration in time range)  insulin glargine-yfgn (SEMGLEE) injection 10 Units (has no administration in time range)  vancomycin (VANCOCIN) IVPB 1000 mg/200 mL premix (1,000 mg Intravenous New Bag/Given 03/23/23 2113)  ceFEPIme (MAXIPIME) 2 g in sodium chloride 0.9 % 100 mL IVPB (0 g Intravenous Stopped 03/23/23  2114)    ED Course/ Medical Decision Making/ A&P                                 Medical Decision Making Amount and/or Complexity of Data Reviewed Independent Historian: guardian and caregiver External Data Reviewed: labs, radiology and notes. Labs: ordered.    Details: CBC,CMP Blood Cx Radiology: ordered and independent interpretation performed.    Details: Right foot XR showed evidence of osteomyelitis.I agree with the radiologist interpretation   Risk Prescription drug management.   #Right toe Infection Patient presented from her PCP due to concerns for an infected chronic left foot wound.On exam pt has a chronic open wet wound on her right toe with surrounding erythema. Wound has a foul smell. There are sign of active infection. XR of foot showed evidence of osteomyelitis involving the head of the first metatarsal and base of the first proximal phalanx, with overlying soft tissue defect.  - Start  Vac and Cefepime - Consult to hospitalist for Admission          Final Clinical Impression(s) / ED Diagnoses Final diagnoses:  None    Rx / DC Orders ED Discharge Orders     None         Kathleen Lime, MD 03/23/23 2254    Tegeler, Canary Brim, MD 03/24/23 1100

## 2023-03-24 ENCOUNTER — Inpatient Hospital Stay (HOSPITAL_COMMUNITY): Payer: 59

## 2023-03-24 ENCOUNTER — Other Ambulatory Visit: Payer: Self-pay

## 2023-03-24 DIAGNOSIS — J9611 Chronic respiratory failure with hypoxia: Secondary | ICD-10-CM

## 2023-03-24 DIAGNOSIS — I639 Cerebral infarction, unspecified: Secondary | ICD-10-CM | POA: Diagnosis not present

## 2023-03-24 DIAGNOSIS — L8990 Pressure ulcer of unspecified site, unspecified stage: Secondary | ICD-10-CM | POA: Diagnosis present

## 2023-03-24 DIAGNOSIS — J432 Centrilobular emphysema: Secondary | ICD-10-CM | POA: Diagnosis not present

## 2023-03-24 DIAGNOSIS — M86171 Other acute osteomyelitis, right ankle and foot: Secondary | ICD-10-CM | POA: Diagnosis not present

## 2023-03-24 LAB — BASIC METABOLIC PANEL
Anion gap: 10 (ref 5–15)
BUN: 19 mg/dL (ref 8–23)
CO2: 26 mmol/L (ref 22–32)
Calcium: 9.2 mg/dL (ref 8.9–10.3)
Chloride: 103 mmol/L (ref 98–111)
Creatinine, Ser: 0.64 mg/dL (ref 0.44–1.00)
GFR, Estimated: >60 mL/min (ref >60)
Glucose, Bld: 167 mg/dL — ABNORMAL HIGH (ref 70–99)
Potassium: 3.6 mmol/L (ref 3.5–5.1)
Sodium: 139 mmol/L (ref 135–145)

## 2023-03-24 LAB — CBC WITH DIFFERENTIAL/PLATELET
Abs Immature Granulocytes: 0.03 10*3/uL (ref 0.00–0.07)
Basophils Absolute: 0 10*3/uL (ref 0.0–0.1)
Basophils Relative: 0 %
Eosinophils Absolute: 0.4 10*3/uL (ref 0.0–0.5)
Eosinophils Relative: 5 %
HCT: 26.4 % — ABNORMAL LOW (ref 36.0–46.0)
Hemoglobin: 8.1 g/dL — ABNORMAL LOW (ref 12.0–15.0)
Immature Granulocytes: 0 %
Lymphocytes Relative: 14 %
Lymphs Abs: 1.1 10*3/uL (ref 0.7–4.0)
MCH: 24.5 pg — ABNORMAL LOW (ref 26.0–34.0)
MCHC: 30.7 g/dL (ref 30.0–36.0)
MCV: 80 fL (ref 80.0–100.0)
Monocytes Absolute: 0.5 10*3/uL (ref 0.1–1.0)
Monocytes Relative: 7 %
Neutro Abs: 5.7 10*3/uL (ref 1.7–7.7)
Neutrophils Relative %: 74 %
Platelets: 280 10*3/uL (ref 150–400)
RBC: 3.3 MIL/uL — ABNORMAL LOW (ref 3.87–5.11)
RDW: 17 % — ABNORMAL HIGH (ref 11.5–15.5)
WBC: 7.7 10*3/uL (ref 4.0–10.5)
nRBC: 0 % (ref 0.0–0.2)

## 2023-03-24 LAB — C-REACTIVE PROTEIN: CRP: 3.3 mg/dL — ABNORMAL HIGH (ref <1.0)

## 2023-03-24 LAB — SEDIMENTATION RATE: Sed Rate: 82 mm/hr — ABNORMAL HIGH (ref 0–22)

## 2023-03-24 LAB — MAGNESIUM: Magnesium: 1.9 mg/dL (ref 1.7–2.4)

## 2023-03-24 LAB — GLUCOSE, CAPILLARY
Glucose-Capillary: 163 mg/dL — ABNORMAL HIGH (ref 70–99)
Glucose-Capillary: 129 mg/dL — ABNORMAL HIGH (ref 70–99)
Glucose-Capillary: 187 mg/dL — ABNORMAL HIGH (ref 70–99)
Glucose-Capillary: 154 mg/dL — ABNORMAL HIGH (ref 70–99)

## 2023-03-24 MED ORDER — GABAPENTIN 300 MG PO CAPS
300.0000 mg | ORAL_CAPSULE | Freq: Three times a day (TID) | ORAL | Status: DC
  Administered 2023-03-24 – 2023-04-02 (×24): 300 mg via ORAL
  Filled 2023-03-24 (×25): qty 1

## 2023-03-24 MED ORDER — GADOBUTROL 1 MMOL/ML IV SOLN
6.0000 mL | Freq: Once | INTRAVENOUS | Status: AC | PRN
  Administered 2023-03-25: 6 mL via INTRAVENOUS

## 2023-03-24 MED ORDER — PANTOPRAZOLE SODIUM 40 MG PO TBEC
40.0000 mg | DELAYED_RELEASE_TABLET | Freq: Two times a day (BID) | ORAL | Status: DC
  Administered 2023-03-24 – 2023-04-02 (×18): 40 mg via ORAL
  Filled 2023-03-24 (×19): qty 1

## 2023-03-24 MED ORDER — SODIUM CHLORIDE 0.9 % IV SOLN
2.0000 g | Freq: Two times a day (BID) | INTRAVENOUS | Status: DC
  Administered 2023-03-24 – 2023-03-30 (×14): 2 g via INTRAVENOUS
  Filled 2023-03-24 (×15): qty 12.5

## 2023-03-24 MED ORDER — ATORVASTATIN CALCIUM 80 MG PO TABS
80.0000 mg | ORAL_TABLET | Freq: Every day | ORAL | Status: DC
  Administered 2023-03-24 – 2023-04-01 (×10): 80 mg via ORAL
  Filled 2023-03-24 (×3): qty 1
  Filled 2023-03-24: qty 2
  Filled 2023-03-24 (×2): qty 1
  Filled 2023-03-24: qty 2
  Filled 2023-03-24 (×3): qty 1
  Filled 2023-03-24: qty 2

## 2023-03-24 MED ORDER — MEDIHONEY WOUND/BURN DRESSING EX PSTE
1.0000 | PASTE | Freq: Every day | CUTANEOUS | Status: DC
  Administered 2023-03-24 – 2023-04-02 (×9): 1 via TOPICAL
  Filled 2023-03-24 (×2): qty 44

## 2023-03-24 MED ORDER — SODIUM CHLORIDE 0.9 % IV SOLN: INTRAVENOUS | Status: DC

## 2023-03-24 MED ORDER — AMLODIPINE BESYLATE 5 MG PO TABS
5.0000 mg | ORAL_TABLET | Freq: Every day | ORAL | Status: DC
  Administered 2023-03-24 – 2023-04-02 (×8): 5 mg via ORAL
  Filled 2023-03-24 (×9): qty 1

## 2023-03-24 MED ORDER — LORAZEPAM 2 MG/ML IJ SOLN
1.0000 mg | Freq: Once | INTRAMUSCULAR | Status: AC | PRN
  Administered 2023-03-24: 1 mg via INTRAVENOUS
  Filled 2023-03-24: qty 1

## 2023-03-24 MED ORDER — VANCOMYCIN HCL IN DEXTROSE 1-5 GM/200ML-% IV SOLN
1000.0000 mg | INTRAVENOUS | Status: DC
  Administered 2023-03-25 – 2023-03-28 (×5): 1000 mg via INTRAVENOUS
  Filled 2023-03-24 (×6): qty 200

## 2023-03-24 NOTE — Plan of Care (Signed)
  Problem: Education: Goal: Ability to describe self-care measures that may prevent or decrease complications (Diabetes Survival Skills Education) will improve Outcome: Progressing   Problem: Education: Goal: Knowledge of General Education information will improve Description: Including pain rating scale, medication(s)/side effects and non-pharmacologic comfort measures Outcome: Progressing   Problem: Activity: Goal: Risk for activity intolerance will decrease Outcome: Progressing   

## 2023-03-24 NOTE — Progress Notes (Signed)
Pharmacy Antibiotic Note  Crystal Moore is a 77 y.o. female admitted on 03/23/2023 with R toe  osteomyelitis .  Pharmacy has been consulted for Cefepime + Vancomycin dosing.  Patient's baseline Scr <0.8  Plan: Cefepime 2gm IV q12h Vancomycin 1gm IV q24h to target AUC 400-550.   Estimated AUC on this regimen = 550. Monitor renal function and cx data   Height: 5\' 2"  (157.5 cm) Weight: 60.6 kg (133 lb 9.6 oz) IBW/kg (Calculated) : 50.1  Temp (24hrs), Avg:98.4 F (36.9 C), Min:97.8 F (36.6 C), Max:99 F (37.2 C)  Recent Labs  Lab 03/23/23 2021 03/23/23 2253  WBC 6.9  --   CREATININE 0.85  --   LATICACIDVEN  --  1.0    Estimated Creatinine Clearance: 48.3 mL/min (by C-G formula based on SCr of 0.85 mg/dL).    Allergies  Allergen Reactions   Aspirin Other (See Comments)    "Cannot take due to previous GI bleed," per daughter      Antimicrobials this admission: 8/27 Vancomycin >>  8/27 Cefepime >>   Dose adjustments this admission:  Microbiology results: 8/27 BCx:   Thank you for allowing pharmacy to be a part of this patient's care.  Junita Push PharmD 03/24/2023 12:33 AM

## 2023-03-24 NOTE — Consult Note (Signed)
WOC Nurse Consult Note: Reason for Consult: Declining wound to right foot, great toe, medial foot.  X ray suspicious for osteomyelitis, awaiting MRI.  On Maxipime and Vancomycin.  PCP concerned for wound on left foot and sent to ED.  Wounds present on left and right feet.   Wound type:vascular, pressure Pressure Injury POA: Yes Measurement: Unstageable due to the presence of eschar/slough Right foot:  3 cm x 4.5 cm slough to wound bed.  Right heel:  2 cmx 3 cm x 0.1 cm pink and moist  Left lateral malleolus:  3.5 cm x 2 cm slough to wound bed Right hip unstageable pressure injury, eschar in wound bed.  Wound [id number] tissue Drainage (amount, consistency, odor)  Minimal serosanguinous  necrotic odor right foot.  Periwound: dry skin Dressing procedure/placement/frequency: Cleanse wounds to right foot and heel, left ankle and right hip with NS and pat dry.  Apply foam dressing to right hip and right heel.  For debridement:  Apply medihoney to right medial foot, left lateral malleolus and right hip.  Cover with gauze dressing and secure with foam.  Change daily.  Will not follow at this time.  Please re-consult if needed.  Mike Gip MSN, RN, FNP-BC CWON Wound, Ostomy, Continence Nurse [facility] [phone] Pager [phone]

## 2023-03-24 NOTE — Progress Notes (Signed)
PROGRESS NOTE  Crystal Moore:[id number] DOB: July 15, 1946   PCP: Lorenda Ishihara, MD  Patient is from: Home?  DOA: 03/23/2023 LOS: 1  Chief complaints Chief Complaint  Patient presents with   Wound Infection     Brief Narrative / Interim history: 77 year old F with PMH of dementia, stage IV NSCLC on palliative radiation, CVA with left hemiparesis and contracture, ILD/hypoxic RF on 2 L, COPD, gastric AVM and DM-2 with right foot ulcer sent to ED by PCP due to foot x-ray concerning for osteomyelitis.  Patient has chronic diabetic wound on the medial aspect of right great toe at the MTP joint that reopened few weeks ago.  Patient has no fever or leukocytosis.  X-ray here confirmed osteomyelitis involving the head of the first metatarsal and base of the first proximal phalanx.  MRI ordered.  Orthopedic surgery consulted.  Patient was started on cefepime and vancomycin and admitted for further care.  Subjective: Seen and examined earlier this morning.  No major events overnight of this morning.  No complaints but not a great historian.  She is awake and alert but only oriented to self.  She follows commands.  Objective: Vitals:   03/23/23 2306 03/24/23 0440 03/24/23 0941 03/24/23 1318  BP: (!) 150/62 (!) 132/53 (!) 153/64 (!) 160/51  Pulse: 86 75 80 87  Resp: 16 15 17 16   Temp: 99 F (37.2 C) 97.9 F (36.6 C) 97.7 F (36.5 C) 98 F (36.7 C)  TempSrc: Oral Oral Oral Oral  SpO2: 98% 97% 98% 95%  Weight:      Height:        Examination:  GENERAL: No apparent distress.  Nontoxic. HEENT: MMM.  Vision and hearing grossly intact.  NECK: Supple.  No apparent JVD.  RESP:  No IWOB.  Fair aeration bilaterally. CVS:  RRR. Heart sounds normal.  ABD/GI/GU: BS+. Abd soft, NTND.  MSK/EXT: Left hemiparesis and contracture.  Skin wound over medial aspect of first MTP.  SKIN: no apparent skin lesion or wound NEURO: Awake and alert.  Oriented to self.  Follows commands.  Left  hemiparesis and contracture.  PSYCH: Calm. Normal affect.   Procedures:  None  Microbiology summarized: Blood cultures NGTD  Assessment and plan: Principal Problem:   Osteomyelitis of foot, right, acute (HCC) Active Problems:   COPD (chronic obstructive pulmonary disease) (HCC)   Depression   COPD (chronic obstructive pulmonary disease) (HCC)   CVA (cerebral vascular accident) (HCC)  Osteomyelitis of foot, right, acute: Noted on x-ray at PCP office and in ED.  Blood cultures NGTD.  CRP 3.3.  ESR 82. -Follow MRI foot -Continue vancomycin and cefepime -Holding Plavix -Appreciate input by wound care -Follow Ortho recs.   Chronic seizure PD/ILD/chronic hypoxic RF: Stable.  On 2 L at baseline. -Bronchodilators as needed   IDDM-2 with hyperglycemia and diabetic wound with osteomyelitis: A1c 6.1% on 7/31. Recent Labs  Lab 03/23/23 2236 03/24/23 0736 03/24/23 1130  GLUCAP 162* 163* 129*  -Continue current insulin regimen -Liberating diet  History of CVA with residual left-sided weakness and spasticity -Continue home meds except Plavix -PT/OT   Essential hypertension: BP slightly elevated -Continue home amlodipine -P.o. hydralazine as needed  Hyperlipidemia -Lipitor resumed   Dementia without behavioral disturbance: Seems advanced.  Awake and alert but only oriented to self. -Reorientation and delirium precaution  Normocytic anemia: H&H stable. Recent Labs    02/22/23 1800 02/23/23 1013 02/23/23 2357 02/24/23 1144 02/24/23 2319 02/25/23 8119 02/26/23 1478 02/27/23 0331 03/23/23 2021 03/24/23  0357  HGB 8.8* 7.9* 7.2* 7.3* 7.0* 8.1* 7.4* 7.8* 9.3* 8.1*  -Continue monitoring   Body mass index is 24.44 kg/m.   Pressure skin injury: POA Pressure Injury 03/23/23 Foot Right;Medial Unstageable - Full thickness tissue loss in which the base of the injury is covered by slough (yellow, tan, gray, green or brown) and/or eschar (tan, brown or black) in the wound  bed. (Active)  03/23/23 2315  Location: Foot  Location Orientation: Right;Medial  Staging: Unstageable - Full thickness tissue loss in which the base of the injury is covered by slough (yellow, tan, gray, green or brown) and/or eschar (tan, brown or black) in the wound bed.  Wound Description (Comments):   Present on Admission: Yes  Dressing Type Foam - Lift dressing to assess site every shift;Gauze (Comment);Honey;Other (Comment) (prev boots) 03/24/23 1300   DVT prophylaxis:  enoxaparin (LOVENOX) injection 40 mg Start: 03/24/23 1000  Code Status: Full code Family Communication: None at the bedside Level of care: Med-Surg Status is: Inpatient Remains inpatient appropriate because: Right foot osteomyelitis   Final disposition: To be determined Consultants:  Orthopedic surgery  55 minutes with more than 50% spent in reviewing records, counseling patient/family and coordinating care.   Sch Meds:  Scheduled Meds:  amLODipine  5 mg Oral Daily   atorvastatin  80 mg Oral QHS   enoxaparin (LOVENOX) injection  40 mg Subcutaneous Q24H   gabapentin  300 mg Oral TID   insulin aspart  0-15 Units Subcutaneous TID WC   insulin aspart  0-5 Units Subcutaneous QHS   insulin glargine-yfgn  10 Units Subcutaneous q AM   leptospermum manuka honey  1 Application Topical Daily   pantoprazole  40 mg Oral BID   Continuous Infusions:  sodium chloride 75 mL/hr at 03/24/23 0111   ceFEPime (MAXIPIME) IV 2 g (03/24/23 0854)   vancomycin     PRN Meds:.acetaminophen **OR** acetaminophen, HYDROmorphone (DILAUDID) injection, LORazepam, senna-docusate  Antimicrobials: Anti-infectives (From admission, onward)    Start     Dose/Rate Route Frequency Ordered Stop   03/24/23 2200  vancomycin (VANCOCIN) IVPB 1000 mg/200 mL premix        1,000 mg 200 mL/hr over 60 Minutes Intravenous Every 24 hours 03/24/23 0153     03/24/23 0800  ceFEPIme (MAXIPIME) 2 g in sodium chloride 0.9 % 100 mL IVPB        2 g 200  mL/hr over 30 Minutes Intravenous Every 12 hours 03/24/23 0033     03/23/23 1730  vancomycin (VANCOCIN) IVPB 1000 mg/200 mL premix        1,000 mg 200 mL/hr over 60 Minutes Intravenous  Once 03/23/23 1728 03/23/23 2351   03/23/23 1730  ceFEPIme (MAXIPIME) 2 g in sodium chloride 0.9 % 100 mL IVPB        2 g 200 mL/hr over 30 Minutes Intravenous  Once 03/23/23 1728 03/23/23 2114        I have personally reviewed the following labs and images: CBC: Recent Labs  Lab 03/23/23 2021 03/24/23 0357  WBC 6.9 7.7  NEUTROABS 5.1 5.7  HGB 9.3* 8.1*  HCT 30.4* 26.4*  MCV 79.2* 80.0  PLT 278 280   BMP &GFR Recent Labs  Lab 03/23/23 2021 03/24/23 0357  NA 138 139  K 4.0 3.6  CL 104 103  CO2 27 26  GLUCOSE 213* 167*  BUN 27* 19  CREATININE 0.85 0.64  CALCIUM 8.9 9.2  MG  --  1.9   Estimated Creatinine Clearance:  51.3 mL/min (by C-G formula based on SCr of 0.64 mg/dL). Liver & Pancreas: Recent Labs  Lab 03/23/23 2021  AST 14*  ALT 15  ALKPHOS 82  BILITOT 0.2*  PROT 7.4  ALBUMIN 2.6*   No results for input(s): "LIPASE", "AMYLASE" in the last 168 hours. No results for input(s): "AMMONIA" in the last 168 hours. Diabetic: No results for input(s): "HGBA1C" in the last 72 hours. Recent Labs  Lab 03/23/23 2236 03/24/23 0736 03/24/23 1130  GLUCAP 162* 163* 129*   Cardiac Enzymes: No results for input(s): "CKTOTAL", "CKMB", "CKMBINDEX", "TROPONINI" in the last 168 hours. No results for input(s): "PROBNP" in the last 8760 hours. Coagulation Profile: No results for input(s): "INR", "PROTIME" in the last 168 hours. Thyroid Function Tests: No results for input(s): "TSH", "T4TOTAL", "FREET4", "T3FREE", "THYROIDAB" in the last 72 hours. Lipid Profile: No results for input(s): "CHOL", "HDL", "LDLCALC", "TRIG", "CHOLHDL", "LDLDIRECT" in the last 72 hours. Anemia Panel: No results for input(s): "VITAMINB12", "FOLATE", "FERRITIN", "TIBC", "IRON", "RETICCTPCT" in the last 72  hours. Urine analysis:    Component Value Date/Time   COLORURINE YELLOW 02/23/2023 0652   APPEARANCEUR CLEAR 02/23/2023 0652   LABSPEC 1.035 (H) 02/23/2023 0652   PHURINE 5.0 02/23/2023 0652   GLUCOSEU NEGATIVE 02/23/2023 0652   HGBUR NEGATIVE 02/23/2023 0652   BILIRUBINUR NEGATIVE 02/23/2023 0652   KETONESUR NEGATIVE 02/23/2023 0652   PROTEINUR NEGATIVE 02/23/2023 0652   NITRITE NEGATIVE 02/23/2023 0652   LEUKOCYTESUR NEGATIVE 02/23/2023 0652   Sepsis Labs: Invalid input(s): "PROCALCITONIN", "LACTICIDVEN"  Microbiology: Recent Results (from the past 240 hour(s))  Blood Cultures x 2 sites     Status: None (Preliminary result)   Collection Time: 03/23/23  8:21 PM   Specimen: BLOOD  Result Value Ref Range Status   Specimen Description   Final    BLOOD SITE NOT SPECIFIED Performed at [facility], [address], [place], [place] [address]    Special Requests   Final    BOTTLES DRAWN AEROBIC AND ANAEROBIC Blood Culture adequate volume Performed at [facility], [address] [place], [place] [address]    Culture   Final    NO GROWTH < 12 HOURS Performed at [facility], [address] [place], [place] [address]    Report Status PENDING  Incomplete  Blood Cultures x 2 sites     Status: None (Preliminary result)   Collection Time: 03/24/23 12:13 AM   Specimen: BLOOD RIGHT ARM  Result Value Ref Range Status   Specimen Description   Final    BLOOD RIGHT ARM Performed at [facility], [address], [place], [place] [address]    Special Requests   Final    BOTTLES DRAWN AEROBIC ONLY Blood Culture results may not be optimal due to an inadequate volume of blood received in culture bottles Performed at [facility], [address], [place], [place] [address]    Culture   Final    NO GROWTH < 12 HOURS Performed at [facility], [address], [place], [place] [address]    Report Status PENDING   Incomplete    Radiology Studies: DG Foot Complete Right  Result Date: 03/23/2023 CLINICAL DATA:  Right foot wound, osteomyelitis EXAM: RIGHT FOOT COMPLETE - 3+ VIEW COMPARISON:  03/23/2023 at 1:57 p.m. FINDINGS: Frontal, oblique, lateral views of the right foot are obtained. The soft tissue defect at the base of the first digit is again identified, with  erosive changes of the first metatarsal head and base of the first proximal phalanx consistent with osteomyelitis. No change since exam performed earlier today. Bones are osteopenic. Multifocal osteoarthritis unchanged. Diffuse soft tissue swelling. IMPRESSION: 1. Osteomyelitis involving the head of the first metatarsal and base of the first proximal phalanx, with overlying soft tissue defect. No change since the exam performed earlier today. Electronically Signed   By: Sharlet Salina M.D.   On: 03/23/2023 17:39      Kerrington Greenhalgh T. Benjamin Casanas [facility]  If 7PM-7AM, please contact night-coverage [email or web address] 03/24/2023, 2:23 PM

## 2023-03-24 NOTE — Progress Notes (Signed)
Per wound care nurses recommendations I called to obtain the air mattress to replace current one. [employer] said that they were out but were having some delivered shortly. They said they would call me once they have them.

## 2023-03-25 ENCOUNTER — Inpatient Hospital Stay (HOSPITAL_COMMUNITY): Payer: 59

## 2023-03-25 DIAGNOSIS — J449 Chronic obstructive pulmonary disease, unspecified: Secondary | ICD-10-CM

## 2023-03-25 DIAGNOSIS — I7090 Unspecified atherosclerosis: Secondary | ICD-10-CM

## 2023-03-25 DIAGNOSIS — F0390 Unspecified dementia without behavioral disturbance: Secondary | ICD-10-CM | POA: Diagnosis not present

## 2023-03-25 DIAGNOSIS — I639 Cerebral infarction, unspecified: Secondary | ICD-10-CM | POA: Diagnosis not present

## 2023-03-25 DIAGNOSIS — J9611 Chronic respiratory failure with hypoxia: Secondary | ICD-10-CM | POA: Diagnosis not present

## 2023-03-25 DIAGNOSIS — M86171 Other acute osteomyelitis, right ankle and foot: Secondary | ICD-10-CM | POA: Diagnosis not present

## 2023-03-25 DIAGNOSIS — J432 Centrilobular emphysema: Secondary | ICD-10-CM | POA: Diagnosis not present

## 2023-03-25 LAB — BLOOD GAS, VENOUS
Acid-Base Excess: 5.5 mmol/L — ABNORMAL HIGH (ref 0.0–2.0)
Acid-Base Excess: 3.7 mmol/L — ABNORMAL HIGH (ref 0.0–2.0)
Bicarbonate: 31.1 mmol/L — ABNORMAL HIGH (ref 20.0–28.0)
Bicarbonate: 29.7 mmol/L — ABNORMAL HIGH (ref 20.0–28.0)
Drawn by: 69990
O2 Saturation: 79.9 %
O2 Saturation: 47.4 %
Patient temperature: 37
Patient temperature: 36.6
pCO2, Ven: 48 mmHg (ref 44–60)
pCO2, Ven: 48 mmHg (ref 44–60)
pH, Ven: 7.42 (ref 7.25–7.43)
pH, Ven: 7.4 (ref 7.25–7.43)
pO2, Ven: 50 mmHg — ABNORMAL HIGH (ref 32–45)
pO2, Ven: 32 mmHg (ref 32–45)

## 2023-03-25 LAB — VITAMIN B12: Vitamin B-12: 601 pg/mL (ref 180–914)

## 2023-03-25 LAB — GLUCOSE, CAPILLARY
Glucose-Capillary: 194 mg/dL — ABNORMAL HIGH (ref 70–99)
Glucose-Capillary: 130 mg/dL — ABNORMAL HIGH (ref 70–99)
Glucose-Capillary: 59 mg/dL — ABNORMAL LOW (ref 70–99)
Glucose-Capillary: 61 mg/dL — ABNORMAL LOW (ref 70–99)
Glucose-Capillary: 129 mg/dL — ABNORMAL HIGH (ref 70–99)
Glucose-Capillary: 97 mg/dL (ref 70–99)

## 2023-03-25 LAB — TSH: TSH: 1.305 u[IU]/mL (ref 0.350–4.500)

## 2023-03-25 LAB — AMMONIA: Ammonia: 31 umol/L (ref 9–35)

## 2023-03-25 MED ORDER — DEXTROSE 50 % IV SOLN
25.0000 mL | Freq: Once | INTRAVENOUS | Status: AC
  Administered 2023-03-25: 25 mL via INTRAVENOUS

## 2023-03-25 MED ORDER — DEXTROSE 50 % IV SOLN
INTRAVENOUS | Status: AC
  Filled 2023-03-25: qty 50

## 2023-03-25 MED ORDER — DEXTROSE-SODIUM CHLORIDE 5-0.9 % IV SOLN: INTRAVENOUS | Status: DC

## 2023-03-25 MED ORDER — INSULIN ASPART 100 UNIT/ML IJ SOLN
0.0000 [IU] | Freq: Three times a day (TID) | INTRAMUSCULAR | Status: DC
  Administered 2023-03-26: 3 [IU] via SUBCUTANEOUS
  Administered 2023-03-26 (×2): 2 [IU] via SUBCUTANEOUS
  Administered 2023-03-27: 1 [IU] via SUBCUTANEOUS
  Administered 2023-03-27 – 2023-03-28 (×4): 2 [IU] via SUBCUTANEOUS
  Administered 2023-03-28 – 2023-03-29 (×2): 1 [IU] via SUBCUTANEOUS
  Administered 2023-03-29 – 2023-03-30 (×3): 2 [IU] via SUBCUTANEOUS
  Administered 2023-03-30: 5 [IU] via SUBCUTANEOUS
  Administered 2023-03-30: 2 [IU] via SUBCUTANEOUS
  Administered 2023-03-31: 5 [IU] via SUBCUTANEOUS
  Administered 2023-03-31 – 2023-04-01 (×2): 2 [IU] via SUBCUTANEOUS
  Administered 2023-04-01: 5 [IU] via SUBCUTANEOUS

## 2023-03-25 NOTE — Plan of Care (Signed)
  Problem: Nutritional: Goal: Maintenance of adequate nutrition will improve Outcome: Not Progressing   Problem: Skin Integrity: Goal: Risk for impaired skin integrity will decrease Outcome: Not Progressing

## 2023-03-25 NOTE — Progress Notes (Signed)
PROGRESS NOTE  Crystal Moore BJY:[id number] DOB: Dec 21, 1945   PCP: Lorenda Ishihara, MD  Patient is from: Home?  DOA: 03/23/2023 LOS: 2  Chief complaints Chief Complaint  Patient presents with   Wound Infection     Brief Narrative / Interim history: 77 year old F with PMH of dementia, stage IV NSCLC on palliative radiation, CVA with left hemiparesis and contracture, PAD, ILD/hypoxic RF on 2 L, COPD, gastric AVM and DM-2 with right foot ulcer sent to ED by PCP due to foot x-ray concerning for osteomyelitis.  Patient has chronic diabetic wound on the medial aspect of right great toe at the MTP joint that reopened few weeks ago.  Patient has no fever or leukocytosis.  X-ray here confirmed osteomyelitis involving the head of the first metatarsal and base of the first proximal phalanx.  MRI ordered.  Orthopedic surgery consulted.  Patient was started on cefepime and vancomycin and admitted for further care.  MRI confirmed osteomyelitis.  Discussed with Ortho, Ramanathan who recommended vascular surgery consult given underlying significant PAD, wound care and IV antibiotics.  Subjective: Seen and examined earlier this morning.  Sleepy this morning.  Wakes to voice but not able to stay awake.  Patient was given IV Ativan and IV Dilaudid last night before MRI.  Patient's grandson at bedside.  Objective: Vitals:   03/24/23 1318 03/24/23 1732 03/24/23 2134 03/25/23 0523  BP: (!) 160/51 (!) 137/57 (!) 92/48 (!) 123/51  Pulse: 87 95 90 79  Resp: 16 13 16 15   Temp: 98 F (36.7 C) 98.8 F (37.1 C) 98.6 F (37 C) 98.1 F (36.7 C)  TempSrc: Oral   Oral  SpO2: 95% 98% 99% 97%  Weight:      Height:        Examination:  GENERAL: No apparent distress.  Nontoxic. HEENT: MMM.  Vision and hearing grossly intact.  NECK: Supple.  No apparent JVD.  RESP:  No IWOB.  Fair aeration bilaterally. CVS:  RRR. Heart sounds normal.  ABD/GI/GU: BS+. Abd soft, NTND.  MSK/EXT:  Skin wound over  medial aspect of first MTP.  SKIN: As above. NEURO: Sleepy.  Wakes to voice but not able to remain awake.  PSYCH: Calm. Normal affect.   Procedures:  None  Microbiology summarized: Blood cultures NGTD  Assessment and plan: Principal Problem:   Osteomyelitis of foot, right, acute (HCC) Active Problems:   COPD (chronic obstructive pulmonary disease) (HCC)   Chronic respiratory failure with hypoxia (HCC)   Depression   COPD (chronic obstructive pulmonary disease) (HCC)   CVA (cerebral vascular accident) (HCC)   Dementia without behavioral disturbance (HCC)   History of CVA (cerebrovascular accident)   Pressure injury of skin  Osteomyelitis of foot, right, acute: Noted on x-ray at PCP office and in ED.  Blood cultures NGTD.  CRP 3.3.  ESR 82.  MRI confirms osteomyelitis of the first metatarsal head, medial and lateral hallux sesamoids and base of first proximal phalanx.  Patient with history of significant PAD.  -Discussed with Ortho, Dr. Odis Hollingshead who recommended VVS consult. -Vascular surgery consult requested -Check ABI -Continue vancomycin and cefepime -Continue holding Plavix -Appreciate input by wound care  Acute toxic encephalopathy: Patient received IV Ativan and Dilaudid about midnight before MRI. -Minimize or avoid sedating medications -Continue monitoring mental status -CT head and other encephalopathy workup if no improvement   Chronic seizure PD/ILD/chronic hypoxic RF: Stable.  On 2 L at baseline. -Bronchodilators as needed   IDDM-2 with hyperglycemia and diabetic wound with  osteomyelitis: A1c 6.1% on 7/31. Recent Labs  Lab 03/24/23 1130 03/24/23 1632 03/24/23 2127 03/25/23 0739 03/25/23 1138  GLUCAP 129* 187* 154* 194* 130*  -Continue current insulin regimen -Liberating diet  History of CVA with residual left-sided weakness and spasticity -Continue home meds except Plavix -PT/OT   Essential hypertension: BP slightly elevated -Continue home  amlodipine -P.o. hydralazine as needed  Hyperlipidemia -Continue home Lipitor   Dementia without behavioral disturbance: Seems advanced.  Awake and alert but only oriented to self. -Reorientation and delirium precaution  Normocytic anemia: H&H stable. Recent Labs    02/22/23 1800 02/23/23 1013 02/23/23 2357 02/24/23 1144 02/24/23 2319 02/25/23 0947 02/26/23 0337 02/27/23 0331 03/23/23 2021 03/24/23 0357  HGB 8.8* 7.9* 7.2* 7.3* 7.0* 8.1* 7.4* 7.8* 9.3* 8.1*  -Continue monitoring  Goal of care counseling: Patient with significant comorbidity as above.  Poor long-term prognosis.  Discussed CODE STATUS including pros and cons of CPR and intubation with grandson, Jody at bedside.  He understands that there was heroic measures and not to patient's best interest but his mother is a Management consultant.  She is currently at work.  He will talk to her when she gets off work. -Palliative medicine consulted  Body mass index is 24.44 kg/m.   Pressure skin injury: POA Pressure Injury 03/23/23 Foot Right;Medial Unstageable - Full thickness tissue loss in which the base of the injury is covered by slough (yellow, tan, gray, green or brown) and/or eschar (tan, brown or black) in the wound bed. (Active)  03/23/23 2315  Location: Foot  Location Orientation: Right;Medial  Staging: Unstageable - Full thickness tissue loss in which the base of the injury is covered by slough (yellow, tan, gray, green or brown) and/or eschar (tan, brown or black) in the wound bed.  Wound Description (Comments):   Present on Admission: Yes  Dressing Type Foam - Lift dressing to assess site every shift;Gauze (Comment);Honey;Other (Comment) (prev boots) 03/24/23 1300   DVT prophylaxis:  enoxaparin (LOVENOX) injection 40 mg Start: 03/24/23 1000  Code Status: Full code Family Communication: Updated patient's grandson at bedside Level of care: Med-Surg Status is: Inpatient Remains inpatient appropriate because:  Right foot osteomyelitis   Final disposition: To be determined Consultants:  Orthopedic surgery Vascular surgery  55 minutes with more than 50% spent in reviewing records, counseling patient/family and coordinating care.   Sch Meds:  Scheduled Meds:  amLODipine  5 mg Oral Daily   atorvastatin  80 mg Oral QHS   enoxaparin (LOVENOX) injection  40 mg Subcutaneous Q24H   gabapentin  300 mg Oral TID   insulin aspart  0-15 Units Subcutaneous TID WC   insulin aspart  0-5 Units Subcutaneous QHS   insulin glargine-yfgn  10 Units Subcutaneous q AM   leptospermum manuka honey  1 Application Topical Daily   pantoprazole  40 mg Oral BID   Continuous Infusions:  sodium chloride 75 mL/hr at 03/25/23 0040   ceFEPime (MAXIPIME) IV 2 g (03/25/23 0914)   vancomycin 1,000 mg (03/25/23 0044)   PRN Meds:.acetaminophen **OR** acetaminophen, HYDROmorphone (DILAUDID) injection, senna-docusate  Antimicrobials: Anti-infectives (From admission, onward)    Start     Dose/Rate Route Frequency Ordered Stop   03/24/23 2200  vancomycin (VANCOCIN) IVPB 1000 mg/200 mL premix        1,000 mg 200 mL/hr over 60 Minutes Intravenous Every 24 hours 03/24/23 0153     03/24/23 0800  ceFEPIme (MAXIPIME) 2 g in sodium chloride 0.9 % 100 mL IVPB  2 g 200 mL/hr over 30 Minutes Intravenous Every 12 hours 03/24/23 0033     03/23/23 1730  vancomycin (VANCOCIN) IVPB 1000 mg/200 mL premix        1,000 mg 200 mL/hr over 60 Minutes Intravenous  Once 03/23/23 1728 03/23/23 2351   03/23/23 1730  ceFEPIme (MAXIPIME) 2 g in sodium chloride 0.9 % 100 mL IVPB        2 g 200 mL/hr over 30 Minutes Intravenous  Once 03/23/23 1728 03/23/23 2114        I have personally reviewed the following labs and images: CBC: Recent Labs  Lab 03/23/23 2021 03/24/23 0357  WBC 6.9 7.7  NEUTROABS 5.1 5.7  HGB 9.3* 8.1*  HCT 30.4* 26.4*  MCV 79.2* 80.0  PLT 278 280   BMP &GFR Recent Labs  Lab 03/23/23 2021 03/24/23 0357   NA 138 139  K 4.0 3.6  CL 104 103  CO2 27 26  GLUCOSE 213* 167*  BUN 27* 19  CREATININE 0.85 0.64  CALCIUM 8.9 9.2  MG  --  1.9   Estimated Creatinine Clearance: 51.3 mL/min (by C-G formula based on SCr of 0.64 mg/dL). Liver & Pancreas: Recent Labs  Lab 03/23/23 2021  AST 14*  ALT 15  ALKPHOS 82  BILITOT 0.2*  PROT 7.4  ALBUMIN 2.6*   No results for input(s): "LIPASE", "AMYLASE" in the last 168 hours. No results for input(s): "AMMONIA" in the last 168 hours. Diabetic: No results for input(s): "HGBA1C" in the last 72 hours. Recent Labs  Lab 03/24/23 1130 03/24/23 1632 03/24/23 2127 03/25/23 0739 03/25/23 1138  GLUCAP 129* 187* 154* 194* 130*   Cardiac Enzymes: No results for input(s): "CKTOTAL", "CKMB", "CKMBINDEX", "TROPONINI" in the last 168 hours. No results for input(s): "PROBNP" in the last 8760 hours. Coagulation Profile: No results for input(s): "INR", "PROTIME" in the last 168 hours. Thyroid Function Tests: No results for input(s): "TSH", "T4TOTAL", "FREET4", "T3FREE", "THYROIDAB" in the last 72 hours. Lipid Profile: No results for input(s): "CHOL", "HDL", "LDLCALC", "TRIG", "CHOLHDL", "LDLDIRECT" in the last 72 hours. Anemia Panel: No results for input(s): "VITAMINB12", "FOLATE", "FERRITIN", "TIBC", "IRON", "RETICCTPCT" in the last 72 hours. Urine analysis:    Component Value Date/Time   COLORURINE YELLOW 02/23/2023 0652   APPEARANCEUR CLEAR 02/23/2023 0652   LABSPEC 1.035 (H) 02/23/2023 0652   PHURINE 5.0 02/23/2023 0652   GLUCOSEU NEGATIVE 02/23/2023 0652   HGBUR NEGATIVE 02/23/2023 0652   BILIRUBINUR NEGATIVE 02/23/2023 0652   KETONESUR NEGATIVE 02/23/2023 0652   PROTEINUR NEGATIVE 02/23/2023 0652   NITRITE NEGATIVE 02/23/2023 0652   LEUKOCYTESUR NEGATIVE 02/23/2023 0652   Sepsis Labs: Invalid input(s): "PROCALCITONIN", "LACTICIDVEN"  Microbiology: Recent Results (from the past 240 hour(s))  Blood Cultures x 2 sites     Status: None  (Preliminary result)   Collection Time: 03/23/23  8:21 PM   Specimen: BLOOD  Result Value Ref Range Status   Specimen Description   Final    BLOOD SITE NOT SPECIFIED Performed at [facility], [address], [place], [place] [address]    Special Requests   Final    BOTTLES DRAWN AEROBIC AND ANAEROBIC Blood Culture adequate volume Performed at [facility], [address] [place], [place] [address]    Culture   Final    NO GROWTH 1 DAY Performed at [facility], [address] [place], [place] [address]    Report Status PENDING  Incomplete  Blood Cultures x 2  sites     Status: None (Preliminary result)   Collection Time: 03/24/23 12:13 AM   Specimen: BLOOD RIGHT ARM  Result Value Ref Range Status   Specimen Description   Final    BLOOD RIGHT ARM Performed at [facility], [address], [place], [place] [address]    Special Requests   Final    BOTTLES DRAWN AEROBIC ONLY Blood Culture results may not be optimal due to an inadequate volume of blood received in culture bottles Performed at [facility], [address], [place], [place] [address]    Culture   Final    NO GROWTH 1 DAY Performed at [facility], [address], [place], [place] [address]    Report Status PENDING  Incomplete    Radiology Studies: MR FOOT RIGHT W WO CONTRAST  Result Date: 03/25/2023 CLINICAL DATA:  Right foot wound with osteomyelitis. EXAM: MRI OF THE RIGHT FOREFOOT WITHOUT AND WITH CONTRAST TECHNIQUE: Multiplanar, multisequence MR imaging of the right forefoot was performed before and after the administration of intravenous contrast. CONTRAST:  6mL GADAVIST GADOBUTROL 1 MMOL/ML IV SOLN COMPARISON:  Right foot x-rays dated March 23, 2023. FINDINGS: Bones/Joint/Cartilage Abnormal marrow edema with corresponding mildly decreased T1 marrow signal involving the first metatarsal head, medial and lateral hallux sesamoids, and base  of the first proximal phalanx. Remaining marrow signal is normal. No fracture or dislocation. Joint spaces are preserved. No joint effusion. Ligaments Collateral ligaments are intact. Muscles and Tendons Flexor and extensor tendons are intact. No tenosynovitis. Increased T2 signal within and atrophy the intrinsic muscles of the forefoot, nonspecific, but likely related to diabetic muscle changes. Soft tissue Soft tissue ulceration along the medial aspect of the first metatarsal head. Surrounding soft tissue swelling extending into the dorsal foot. No fluid collection. No soft tissue mass. IMPRESSION: 1. Soft tissue ulceration along the medial aspect of the first metatarsal head with underlying osteomyelitis of the first metatarsal head, medial and lateral hallux sesamoids, and base of the first proximal phalanx. 2. No abscess. Electronically Signed   By: Obie Dredge M.D.   On: 03/25/2023 08:17      Corrinne Benegas T. Fallon Haecker [facility]  If 7PM-7AM, please contact night-coverage [email or web address] 03/25/2023, 12:00 PM

## 2023-03-25 NOTE — Progress Notes (Addendum)
Report given to [facility] RN  at [facility] reflecting patient's current status, tried calling patient's daughter and grandson to let them know but their numbers was not working.

## 2023-03-25 NOTE — Consult Note (Addendum)
Hospital Consult    Reason for Consult: Right foot osteomyelitis with underlying PAD Requesting Physician: Hospital medicine MRN #:  [id number]  History of Present Illness: This is a 77 y.o. female well-known to the vascular surgery service having undergone stenting years ago.  She presented to the hospital with wounds on the right foot with imaging demonstrating osteomyelitis.  On exam, she was resting comfortably.  She awoke to voice.  She was nonverbal otherwise.  All history was obtained from her daughter whom I called.  Sotiria has been nonambulatory for 6 years.  She is a full assist.  She is contracted at both the hips and the knees.  She continues to live at home with her daughter, who has full-time care.  Her grandson is getting married next year, and her goal is to keep her alive to attend the wedding.  Past Medical History:  Diagnosis Date   Abnormal gait    wheelchair bound   Anemia 06/24/2022   IDA   Arthritis    COPD (chronic obstructive pulmonary disease) (HCC)    COVID-19 virus infection 07/2020   CVA (cerebral vascular accident) (HCC) 09/22/2022   with residual left-sided weakness   Dementia (HCC)    Depression    no current problem   Diabetes mellitus without complication (HCC)    GERD (gastroesophageal reflux disease)    Headache    Hemiparesis affecting left side as late effect of cerebrovascular accident (CVA) (HCC)    w/c bound   HOH (hard of hearing) 01/13/2018   no hearing aids   Hypertension    Mass of lung    bilateral masses   PAD (peripheral artery disease) (HCC)    in CE   Pneumonia    x 4   Pyelonephritis    Seizures (HCC) 09/25/2022   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   Sensorineural hearing loss    bilateral in CE, no hearing aids   Stroke [facility])    Tobacco use disorder 2019   quit 2019   UTI (urinary tract infection)    Wheelchair dependent     Past Surgical History:  Procedure Laterality Date   ABDOMINAL  AORTOGRAM W/LOWER EXTREMITY N/A 08/30/2020   Procedure: ABDOMINAL AORTOGRAM W/LOWER EXTREMITY;  Surgeon: Leonie Douglas, MD;  Location: [facility];  Service: Cardiovascular;  Laterality: N/A;   ABDOMINAL AORTOGRAM W/LOWER EXTREMITY N/A 09/04/2020   Procedure: ABDOMINAL AORTOGRAM W/LOWER EXTREMITY;  Surgeon: Leonie Douglas, MD;  Location: [facility];  Service: Cardiovascular;  Laterality: N/A;   ESOPHAGOGASTRODUODENOSCOPY (EGD) WITH PROPOFOL N/A 09/16/2020   Procedure: ESOPHAGOGASTRODUODENOSCOPY (EGD) WITH PROPOFOL;  Surgeon: Shellia Cleverly, DO;  Location: [facility] ENDOSCOPY;  Service: Gastroenterology;  Laterality: N/A;   FINE NEEDLE ASPIRATION  10/27/2022   Procedure: FINE NEEDLE ASPIRATION (FNA) LINEAR;  Surgeon: Josephine Igo, DO;  Location: [facility] ENDOSCOPY;  Service: Pulmonary;;   HEMOSTASIS CLIP PLACEMENT  09/16/2020   Procedure: HEMOSTASIS CLIP PLACEMENT;  Surgeon: Shellia Cleverly, DO;  Location: [facility] ENDOS[facility];  Service: Gastroenterology;;   HOT HEMOSTASIS N/A 09/16/2020   Procedure: HOT HEMOSTASIS (ARGON PLASMA COAGULATION/BICAP);  Surgeon: Shellia Cleverly, DO;  Location: [facility] ENDOSCOPY;  Service: Gastroenterology;  Laterality: N/A;   PERIPHERAL VASCULAR INTERVENTION Right 08/30/2020   Procedure: PERIPHERAL VASCULAR INTERVENTION;  Surgeon: Leonie Douglas, MD;  Location: [facility];  Service: Cardiovascular;  Laterality: Right;  femoral popliteal   PERIPHERAL VASCULAR INTERVENTION Left 09/04/2020   Procedure: PERIPHERAL VASCULAR INTERVENTION;  Surgeon: Leonie Douglas, MD;  Location[facility] INVASIVE CV LAB;  Service: Cardiovascular;  Laterality: Left;  SFA   TUBAL LIGATION     VIDEO BRONCHOSCOPY WITH ENDOBRONCHIAL ULTRASOUND Bilateral 10/27/2022   Procedure: VIDEO BRONCHOSCOPY WITH ENDOBRONCHIAL ULTRASOUND;  Surgeon: Josephine Igo, DO;  Location: [facility] ENDOSCOPY;  Service: Pulmonary;  Laterality: Bilateral;    Allergies  Allergen Reactions   Aspirin Other (See Comments)     "Cannot take due to previous GI bleed," per daughter      Prior to Admission medications   Medication Sig Start Date End Date Taking? Authorizing Provider  acetaminophen (TYLENOL) 500 MG tablet Take 1,000 mg by mouth every 6 (six) hours as needed (pain).   Yes [provider]  albuterol (VENTOLIN HFA) 108 (90 Base) MCG/ACT inhaler Inhale 2 puffs into the lungs every 6 (six) hours as needed for wheezing or shortness of breath.   Yes [provider]  amLODipine (NORVASC) 5 MG tablet Take 5 mg by mouth in the morning. 05/30/21  Yes [provider]  atorvastatin (LIPITOR) 80 MG tablet Take 1 tablet (80 mg total) by mouth daily at 6 PM. Patient taking differently: Take 80 mg by mouth at bedtime. 12/21/18  Yes Mikhail, Nita Sells, DO  clopidogrel (PLAVIX) 75 MG tablet Take 1 tablet (75 mg total) by mouth daily. 09/24/20  Yes Leonie Douglas, MD  feeding supplement (ENSURE ENLIVE / ENSURE PLUS) LIQD Take 237 mLs by mouth 2 (two) times daily between meals. 06/28/20  Yes Romero Belling, MD  gabapentin (NEURONTIN) 300 MG capsule Take 1 capsule (300 mg total) by mouth 3 (three) times daily. 09/06/20  Yes Kathlen Mody, MD  insulin glargine (LANTUS) 100 UNIT/ML injection Inject 0.1 mLs (10 Units total) into the skin daily. Patient taking differently: Inject 10 Units into the skin in the morning. 02/27/23  Yes Rodolph Bong, MD  linagliptin (TRADJENTA) 5 MG TABS tablet Take 5 mg by mouth daily.   Yes [provider]  pantoprazole (PROTONIX) 40 MG tablet Take 40 mg by mouth 2 (two) times daily.   Yes [provider]  SANTYL 250 UNIT/GM ointment Apply 1 Application topically See admin instructions. Apply as directed to affected areas of both feet daily as directed 10/23/22  Yes [provider]  ACCU-CHEK GUIDE test strip USE TO test AS DIRECTED TWICE DAILY Patient taking differently: 1 each by Other route 2 (two) times daily. 05/23/21   Romero Belling, MD   Accu-Chek Softclix Lancets lancets 1 each by Other route daily. 08/26/22   [provider]  albuterol (PROVENTIL) (2.5 MG/3ML) 0.083% nebulizer solution Inhale 3 mLs (2.5 mg total) into the lungs every 4 (four) hours as needed for wheezing or shortness of breath. Patient not taking: Reported on 03/23/2023 02/27/23   Rodolph Bong, MD  ferrous sulfate 325 (65 FE) MG tablet Take 1 tablet (325 mg total) by mouth daily with breakfast. Patient not taking: Reported on 03/23/2023 09/26/22   Osvaldo Shipper, MD  folic acid (FOLVITE) 1 MG tablet Take 1 tablet (1 mg total) by mouth daily. Patient not taking: Reported on 03/23/2023 09/26/22   Osvaldo Shipper, MD  ipratropium (ATROVENT) 0.02 % nebulizer solution Take 2.5 mLs (0.5 mg total) by nebulization 3 (three) times daily for 5 days. Patient not taking: Reported on 03/23/2023 02/27/23 03/23/23  Rodolph Bong, MD  levalbuterol Pauline Aus) 0.63 MG/3ML nebulizer solution Take 3 mLs (0.63 mg total) by nebulization 3 (three) times daily for 5 days. Patient  not taking: Reported on 03/23/2023 02/27/23 03/23/23  Rodolph Bong, MD    Social History   Socioeconomic History   Marital status: Single    Spouse name: Not on file   Number of children: Not on file   Years of education: Not on file   Highest education level: Not on file  Occupational History   Not on file  Tobacco Use   Smoking status: Former    Current packs/day: 0.00    Average packs/day: 1 pack/day for 40.0 years (40.0 ttl pk-yrs)    Types: Cigarettes    Start date: 24    Quit date: 2019    Years since quitting: 5.6   Smokeless tobacco: Never  Vaping Use   Vaping status: Never Used  Substance and Sexual Activity   Alcohol use: Never   Drug use: Never   Sexual activity: Not Currently    Birth control/protection: Post-menopausal  Other Topics Concern   Not on file  Social History Narrative   ** Merged History Encounter **   Right handed    Lives with family         Social Determinants of Health   Financial Resource Strain: Not on file  Food Insecurity: Patient Unable To Answer (03/23/2023)   Hunger Vital Sign    Worried About Running Out of Food in the Last Year: Patient unable to answer    Ran Out of Food in the Last Year: Patient unable to answer  Transportation Needs: Patient Unable To Answer (03/23/2023)   PRAPARE - Transportation    Lack of Transportation (Medical): Patient unable to answer    Lack of Transportation (Non-Medical): Patient unable to answer  Physical Activity: Not on file  Stress: Not on file  Social Connections: Not on file  Intimate Partner Violence: Patient Unable To Answer (03/23/2023)   Humiliation, Afraid, Rape, and Kick questionnaire    Fear of Current or Ex-Partner: Patient unable to answer    Emotionally Abused: Patient unable to answer    Physically Abused: Patient unable to answer    Sexually Abused: Patient unable to answer   Family History  Problem Relation Age of Onset   Diabetes Mother    Diabetes Sister    Diabetes Brother     ROS: Otherwise negative unless mentioned in HPI  Physical Examination  Vitals:   03/25/23 1428 03/25/23 2110  BP: (!) 126/54 (!) 145/80  Pulse: 82   Resp: 18 16  Temp: 98 F (36.7 C) 97.8 F (36.6 C)  SpO2:  100%   Body mass index is 24.44 kg/m.  General:  WDWN in NAD Gait: Not observed HENT: WNL, normocephalic Pulmonary: normal non-labored breathing Cardiac: regular Abdomen: soft, NT/ND, no masses Skin: without rashes Vascular Exam/Pulses: Palpable right femoral pulse, nonpalpable pedal pulses.  The left femoral pulse was unable to be palpated due to the patient's contracture. Extremities: with ischemic changes, without Gangrene , without cellulitis; with open wounds;  Musculoskeletal: no muscle wasting or atrophy  Neurologic: Nonverbal, not alert, not oriented. Psychiatric: Patient unable to interact meaningfully. Lymph:  Unremarkable  CBC    Component  Value Date/Time   WBC 7.7 03/24/2023 0357   RBC 3.30 (L) 03/24/2023 0357   HGB 8.1 (L) 03/24/2023 0357   HGB 11.1 (L) 11/11/2022 0809   HCT 26.4 (L) 03/24/2023 0357   PLT 280 03/24/2023 0357   PLT 296 11/11/2022 0809   MCV 80.0 03/24/2023 0357   MCH 24.5 (L) 03/24/2023 0357   MCHC 30.7  03/24/2023 0357   RDW 17.0 (H) 03/24/2023 0357   LYMPHSABS 1.1 03/24/2023 0357   MONOABS 0.5 03/24/2023 0357   EOSABS 0.4 03/24/2023 0357   BASOSABS 0.0 03/24/2023 0357    BMET    Component Value Date/Time   NA 139 03/24/2023 0357   K 3.6 03/24/2023 0357   CL 103 03/24/2023 0357   CO2 26 03/24/2023 0357   GLUCOSE 167 (H) 03/24/2023 0357   BUN 19 03/24/2023 0357   CREATININE 0.64 03/24/2023 0357   CREATININE 1.03 (H) 11/11/2022 0809   CALCIUM 9.2 03/24/2023 0357   GFRNONAA >60 03/24/2023 0357   GFRNONAA 56 (L) 11/11/2022 0809   GFRAA >60 01/16/2019 0714    COAGS: Lab Results  Component Value Date   INR 1.1 09/22/2022   INR 1.1 06/27/2021   INR 2.4 (H) 01/30/2021      ASSESSMENT/PLAN: This is a 77 y.o. female with history of COPD, dementia, previous CVA, PAD, stage IV NSCLC on palliative radiation who is nonambulatory and contracted at baseline.  She presents with pressure induced wounds to the right foot.  I had a long conversation with Lizzy's daughter regarding her options.  I was very frank that I think she would be best served with palliative care discussions due to her comorbidities.  The only surgery I would offer would be above-knee amputation.  Her daughter was adamant that she would like an above-knee amputation and that her mother would like above-knee amputation if she can verbalize her wishes.  I asked that she meet with palliative care first for discussions regarding what nonsurgical options are available.  She stated she would voice her final answer to Dr. Alanda Slim, but she was pretty sure she wanted to pursue above amputation.  I told her that if she did not with the PAD  associated in the other leg, this would also have a similar outcome down the road.  She was open to bilateral above-knee amputation should the patient need this.  Again, I was very frank that I think she would be best served with comfort care measures.  Please consult palliative care.  I will continue to follow.  Should the patient pursue surgery, please transfer to [facility].  Possible surgery middle of next week if needed.   Victorino Sparrow MD [facility] [phone] 03/25/2023  9:17 PM

## 2023-03-25 NOTE — Progress Notes (Signed)
ABI's have been completed. Preliminary results can be found in CV Proc through chart review.   03/25/23 1:51 PM Olen Cordial RVT

## 2023-03-25 NOTE — TOC Initial Note (Signed)
Transition of Care [facility]) - Initial/Assessment Note   Patient Details  Name: Crystal Moore MRN: [id number] Date of Birth: 1946-04-20  Transition of [facility]) CM/SW Contact:    Ewing Schlein, LCSW Phone Number: 03/25/2023, 1:15 PM  Clinical Narrative: TOC following for discharge needs. Per chart review, patient currently has a CAP aide 7 days/week. Patient is on 2L/min home oxygen and is active with [facility].  Expected Discharge Plan:  (TBD) Barriers to Discharge: Continued Medical Work up  Expected Discharge Plan and Services In-house Referral: Clinical Social Work Living arrangements for the past 2 months: Single Family Home  Prior Living Arrangements/Services Living arrangements for the past 2 months: Single Family Home Patient language and need for interpreter reviewed:: Yes Need for Family Participation in Patient Care: Yes (Comment) (Patient is oriented to self only.) Care giver support system in place?: Yes (comment) Current home services: Homehealth aide, DME, Home RN (Patient has CAP aide. Patient is on 2L/min home oxygen. Patient is active with [facility].) Criminal Activity/Legal Involvement Pertinent to Current Situation/Hospitalization: No - Comment as needed  Activities of Daily Living Home Assistive Devices/Equipment: Wheelchair ADL Screening (condition at time of admission) Patient's cognitive ability adequate to safely complete daily activities?: No Is the patient deaf or have difficulty hearing?: No Does the patient have difficulty seeing, even when wearing glasses/contacts?: No Does the patient have difficulty concentrating, remembering, or making decisions?: Yes Patient able to express need for assistance with ADLs?: Yes Does the patient have difficulty dressing or bathing?: Yes Independently performs ADLs?: No Communication: Independent Dressing (OT): Dependent Is this a change from baseline?: Pre-admission baseline Grooming: Dependent Is  this a change from baseline?: Pre-admission baseline Feeding: Needs assistance Is this a change from baseline?: Pre-admission baseline Bathing: Dependent Is this a change from baseline?: Pre-admission baseline Toileting: Dependent Is this a change from baseline?: Pre-admission baseline In/Out Bed: Needs assistance Is this a change from baseline?: Pre-admission baseline Walks in Home: Dependent Is this a change from baseline?: Pre-admission baseline Does the patient have difficulty walking or climbing stairs?: Yes Weakness of Legs: Both Weakness of Arms/Hands: Left  Emotional Assessment Orientation: : Oriented to Self Alcohol / Substance Use: Not Applicable Psych Involvement: No (comment)  Admission diagnosis:  Osteomyelitis of foot, right, acute (HCC) [M86.171] Patient Active Problem List   Diagnosis Date Noted   Pressure injury of skin 03/24/2023   Osteomyelitis of foot, right, acute (HCC) 03/23/2023   Aspiration pneumonia (HCC) 02/24/2023   Hypophosphatemia 02/24/2023   Complicated UTI (urinary tract infection) 02/23/2023   Acute metabolic encephalopathy 02/22/2023   History of CVA (cerebrovascular accident) 02/22/2023   Tracheal mass 02/22/2023   Goals of care, counseling/discussion 11/11/2022   Malignant neoplasm of upper lobe of left lung (HCC) 10/15/2022   Neurological symptoms 09/22/2022   Hypoxemia 09/22/2022   Acute respiratory failure with hypoxia (HCC)    Diabetic oculopathy (HCC) 11/14/2020   Hemiplegia of nondominant side as late effect of cerebrovascular disease (HCC) 11/14/2020   Hyperglycemia due to type 2 diabetes mellitus (HCC) 11/14/2020   Long term (current) use of insulin (HCC) 11/14/2020   Memory loss 11/14/2020   Nicotine dependence, unspecified, uncomplicated 11/14/2020   Non-pressure chronic ulcer of other part of left foot with unspecified severity (HCC) 11/14/2020   Residual cognitive deficit as late effect of cerebrovascular accident 11/14/2020    Pressure injury of skin of both feet 09/16/2020   Acute blood loss anemia    Gastric AVM  Acute upper GI bleed 09/14/2020   Symptomatic anemia 09/14/2020   PVD (peripheral vascular disease) (HCC) 09/14/2020   Cellulitis of great toe of right foot 08/26/2020   CVA (cerebral vascular accident) (HCC)    COVID-19 virus infection 07/2020   Ankle ulcer (HCC) 06/28/2020   Closed fracture of neck of left femur (HCC) 08/11/2019   COVID-19 virus detected 08/11/2019   Dementia without behavioral disturbance (HCC) 08/11/2019   AKI (acute kidney injury) (HCC) 01/15/2019   Cerebral thrombosis with cerebral infarction 01/15/2019   TIA (transient ischemic attack) 01/13/2019   Insulin dependent type 2 diabetes mellitus (HCC) 01/13/2019   Hypokalemia 12/18/2018   Acute CVA (cerebrovascular accident) (HCC) 12/17/2018   Malnutrition of moderate degree 05/31/2018   Hematochezia 05/29/2018   Ischemic colitis (HCC) 05/29/2018   Acute encephalopathy 05/29/2018   Type 2 diabetes mellitus (HCC) 05/29/2018   Hypertension 05/29/2018   Depression 05/29/2018   COPD (chronic obstructive pulmonary disease) (HCC) 05/29/2018   Sepsis (HCC) 05/28/2018   Diabetes (HCC) 03/16/2018   ILD (interstitial lung disease) (HCC) 02/18/2018   Chronic respiratory failure with hypoxia (HCC) 02/18/2018   COPD (chronic obstructive pulmonary disease) (HCC) 01/12/2018   Tobacco abuse 01/12/2018   Arthritis 07/29/2017   Asthma 07/29/2017   Gastroesophageal reflux disease 07/29/2017   Hyperlipidemia 07/29/2017   Left hemiplegia (HCC) 07/29/2017   Sciatica 07/29/2017   Vitamin D deficiency 07/29/2017   PCP:  Lorenda Ishihara, MD Pharmacy:   [facility] - [place], [place] - [address] [place] [address] Phone: [phone] Fax: [phone]  Social Determinants of Health (SDOH) Social History: SDOH Screenings   Food Insecurity: Patient Unable To  Answer (03/23/2023)  Housing: High Risk (03/23/2023)  Transportation Needs: Patient Unable To Answer (03/23/2023)  Utilities: Patient Unable To Answer (03/23/2023)  Tobacco Use: Medium Risk (03/23/2023)   SDOH Interventions:    Readmission Risk Interventions    03/25/2023    1:00 PM 09/18/2020   12:44 PM  Readmission Risk Prevention Plan  Transportation Screening Complete Complete  PCP or Specialist Appt within 3-5 Days  Complete  HRI or Home Care Consult Complete   Social Work Consult for Recovery Care Planning/Counseling Complete Complete  Palliative Care Screening Complete Not Applicable  Medication Review [employer]) Complete Complete

## 2023-03-25 NOTE — Plan of Care (Signed)
  Problem: Education: Goal: Knowledge of General Education information will improve Description: Including pain rating scale, medication(s)/side effects and non-pharmacologic comfort measures Outcome: Progressing   Problem: Health Behavior/Discharge Planning: Goal: Ability to manage health-related needs will improve Outcome: Progressing   Problem: Clinical Measurements: Goal: Ability to maintain clinical measurements within normal limits will improve Outcome: Progressing Goal: Will remain free from infection Outcome: Progressing Goal: Diagnostic test results will improve Outcome: Progressing Goal: Respiratory complications will improve Outcome: Progressing Goal: Cardiovascular complication will be avoided Outcome: Progressing   Problem: Activity: Goal: Risk for activity intolerance will decrease Outcome: Not Applicable   Problem: Nutrition: Goal: Adequate nutrition will be maintained Outcome: Progressing   Problem: Coping: Goal: Level of anxiety will decrease Outcome: Progressing   Problem: Elimination: Goal: Will not experience complications related to bowel motility Outcome: Progressing Goal: Will not experience complications related to urinary retention Outcome: Completed/Met   Problem: Pain Managment: Goal: General experience of comfort will improve Outcome: Progressing   Problem: Safety: Goal: Ability to remain free from injury will improve Outcome: Progressing   Problem: Skin Integrity: Goal: Risk for impaired skin integrity will decrease Outcome: Progressing

## 2023-03-26 DIAGNOSIS — M86171 Other acute osteomyelitis, right ankle and foot: Secondary | ICD-10-CM | POA: Diagnosis not present

## 2023-03-26 DIAGNOSIS — Z7189 Other specified counseling: Secondary | ICD-10-CM | POA: Diagnosis not present

## 2023-03-26 DIAGNOSIS — Z515 Encounter for palliative care: Secondary | ICD-10-CM | POA: Diagnosis not present

## 2023-03-26 LAB — GLUCOSE, CAPILLARY
Glucose-Capillary: 234 mg/dL — ABNORMAL HIGH (ref 70–99)
Glucose-Capillary: 213 mg/dL — ABNORMAL HIGH (ref 70–99)
Glucose-Capillary: 151 mg/dL — ABNORMAL HIGH (ref 70–99)
Glucose-Capillary: 168 mg/dL — ABNORMAL HIGH (ref 70–99)
Glucose-Capillary: 143 mg/dL — ABNORMAL HIGH (ref 70–99)

## 2023-03-26 LAB — RENAL FUNCTION PANEL
Albumin: 2.3 g/dL — ABNORMAL LOW (ref 3.5–5.0)
Anion gap: 7 (ref 5–15)
BUN: 11 mg/dL (ref 8–23)
CO2: 23 mmol/L (ref 22–32)
Calcium: 8.9 mg/dL (ref 8.9–10.3)
Chloride: 107 mmol/L (ref 98–111)
Creatinine, Ser: 1.06 mg/dL — ABNORMAL HIGH (ref 0.44–1.00)
GFR, Estimated: 54 mL/min — ABNORMAL LOW (ref >60)
Glucose, Bld: 224 mg/dL — ABNORMAL HIGH (ref 70–99)
Phosphorus: 2.3 mg/dL — ABNORMAL LOW (ref 2.5–4.6)
Potassium: 3.9 mmol/L (ref 3.5–5.1)
Sodium: 137 mmol/L (ref 135–145)

## 2023-03-26 LAB — CBC
HCT: 26.9 % — ABNORMAL LOW (ref 36.0–46.0)
Hemoglobin: 8.3 g/dL — ABNORMAL LOW (ref 12.0–15.0)
MCH: 23.9 pg — ABNORMAL LOW (ref 26.0–34.0)
MCHC: 30.9 g/dL (ref 30.0–36.0)
MCV: 77.3 fL — ABNORMAL LOW (ref 80.0–100.0)
Platelets: 295 10*3/uL (ref 150–400)
RBC: 3.48 MIL/uL — ABNORMAL LOW (ref 3.87–5.11)
RDW: 17.2 % — ABNORMAL HIGH (ref 11.5–15.5)
WBC: 7.6 10*3/uL (ref 4.0–10.5)
nRBC: 0 % (ref 0.0–0.2)

## 2023-03-26 LAB — VAS US ABI WITH/WO TBI
Left ABI: 0.5
Right ABI: 0.4

## 2023-03-26 LAB — RPR
RPR Ser Ql: NONREACTIVE
RPR Ser Ql: NONREACTIVE

## 2023-03-26 LAB — MAGNESIUM: Magnesium: 1.7 mg/dL (ref 1.7–2.4)

## 2023-03-26 MED ORDER — VITAMIN C 500 MG PO TABS
500.0000 mg | ORAL_TABLET | Freq: Every day | ORAL | Status: DC
  Administered 2023-03-26 – 2023-04-02 (×8): 500 mg via ORAL
  Filled 2023-03-26 (×8): qty 1

## 2023-03-26 MED ORDER — ONDANSETRON HCL 4 MG/2ML IJ SOLN: 4.0000 mg | Freq: Four times a day (QID) | INTRAMUSCULAR | Status: DC | PRN

## 2023-03-26 MED ORDER — ZINC SULFATE 220 (50 ZN) MG PO CAPS
220.0000 mg | ORAL_CAPSULE | Freq: Every day | ORAL | Status: DC
  Administered 2023-03-26 – 2023-04-02 (×8): 220 mg via ORAL
  Filled 2023-03-26 (×8): qty 1

## 2023-03-26 MED ORDER — TRAZODONE HCL 50 MG PO TABS: 50.0000 mg | ORAL_TABLET | Freq: Every evening | ORAL | Status: DC | PRN

## 2023-03-26 MED ORDER — METOPROLOL TARTRATE 5 MG/5ML IV SOLN: 5.0000 mg | INTRAVENOUS | Status: DC | PRN

## 2023-03-26 MED ORDER — INSULIN GLARGINE-YFGN 100 UNIT/ML ~~LOC~~ SOLN
5.0000 [IU] | Freq: Every morning | SUBCUTANEOUS | Status: DC
  Administered 2023-03-27 – 2023-04-01 (×6): 5 [IU] via SUBCUTANEOUS
  Filled 2023-03-26 (×9): qty 0.05

## 2023-03-26 MED ORDER — OXYCODONE HCL 5 MG PO TABS
5.0000 mg | ORAL_TABLET | ORAL | Status: DC | PRN
  Administered 2023-03-26: 5 mg via ORAL
  Filled 2023-03-26 (×2): qty 1

## 2023-03-26 MED ORDER — GUAIFENESIN 100 MG/5ML PO LIQD: 5.0000 mL | ORAL | Status: DC | PRN

## 2023-03-26 MED ORDER — HYDRALAZINE HCL 20 MG/ML IJ SOLN: 10.0000 mg | INTRAMUSCULAR | Status: DC | PRN

## 2023-03-26 MED ORDER — IPRATROPIUM-ALBUTEROL 0.5-2.5 (3) MG/3ML IN SOLN: 3.0000 mL | RESPIRATORY_TRACT | Status: DC | PRN

## 2023-03-26 NOTE — Consult Note (Addendum)
Palliative Care Consult Note                                  Date: 03/26/2023   Patient Name: Crystal Moore  DOB: [birth year]  MRN: [id number]  Age / Sex: 77 y.o., female  PCP: Lorenda Ishihara, MD Referring Physician: Miguel Rota, MD  Reason for Consultation: Establishing goals of care  HPI/Patient Profile: 77 y.o. female  with past medical history of dementia, stage IV NSCLC on palliative radiation, CVA with left-sided hemiparesis and contracture, PAD, ILD with hypoxia 2 L nasal cannula, COPD, gastric AVM, DM2  admitted on 03/23/2023 with right diabetic foot ulcer sent by PCP due to concerns of osteomyelitis.   Upon admission MRI confirmed osteomyelitis of her foot, orthopedic was consulted who recommended also consulting vascular due to concerns of significant PAD.   Past Medical History:  Diagnosis Date   Abnormal gait    wheelchair bound   Anemia 06/24/2022   IDA   Arthritis    COPD (chronic obstructive pulmonary disease) (HCC)    COVID-19 virus infection 07/2020   CVA (cerebral vascular accident) (HCC) 09/22/2022   with residual left-sided weakness   Dementia (HCC)    Depression    no current problem   Diabetes mellitus without complication (HCC)    GERD (gastroesophageal reflux disease)    Headache    Hemiparesis affecting left side as late effect of cerebrovascular accident (CVA) (HCC)    w/c bound   HOH (hard of hearing) 01/13/2018   no hearing aids   Hypertension    Mass of lung    bilateral masses   PAD (peripheral artery disease) (HCC)    in CE   Pneumonia    x 4   Pyelonephritis    Seizures (HCC) 09/25/2022   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   Sensorineural hearing loss    bilateral in CE, no hearing aids   Stroke [facility])    Tobacco use disorder 2019   quit 2019   UTI (urinary tract infection)    Wheelchair dependent     Subjective:   I have reviewed medical records  including EPIC notes, labs and imaging, assessed the patient and then met with the patient's daughter Lillyannah Lehl at bedside to discuss diagnosis prognosis, GOC, EOL wishes, disposition and options. The patient was unable to participate in the conversation  I introduced Palliative Medicine as specialized medical care for people living with serious illness. It focuses on providing relief from symptoms and stress of a serious illness. The goal is to improve quality of life for both the patient and the family.    Patient/Family Understanding of Illness: [name] has a good understanding of her mother's chronic and acute illnesses. She admits she is very optimistic when it comes to her mothers care and her hopes for her.  Social History: Patient has a large family. She lives with her daughter but also has three adult sons. 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   Functional and Nutritional State: The patient has been hospitalized three times in 6 months. She is  non-ambulatory. She has somebody with her at all times. She has aides that help 12 hours a day. The patient loves to eat. Her grandson ensures she eats a healthy diet. She has no trouble with swallowing.   Today's Discussion: [name] has been the patient's caregiver for many years. She understands the seriousness of the patient's osteomyelitis. When we discussed options for osteomyelitis management she was confident in her decision to proceed with amputation. We discussed possible complications and discussed anticipatory needs after surgery. [name] shares she is very optimistic about her mother's healthcare and that God will take her home when it is time.[name] shares her mother still has quality of life and enjoys the company of her family. I asked [name] if her mother was able to make the decision what she would choose and she said her mother would not choose the amputation.   I told [name] I was worried about  the recovery for her mother with her comorbidities. [name]'s long term goal for her mother is to have her back home after recovery. The family also wants her to be around when her grandson gets married in May of 2025. The patient and [name] have a very strong support system and [name] plans to lean on this support system through recovery and moving forward.  Recommended consideration of DNR status, understanding evidenced-based poor outcomes in similar hospitalized patients, as the cause of the arrest is likely associated with chronic/terminal disease rather than a reversible acute cardio-pulmonary event. [name] and her family have been discussing this and would not want CPR. I told [name] that her mother is currently a FULL code/ FULL scope--- she understands and does not want to change this. She has told her family members to stop CPR from occurring if they are here and she were to die.  Discussed the importance of continued conversation with family and the medical providers regarding overall plan of care and treatment options, ensuring decisions are within the context of the patient's values and GOCs.  Questions and concerns were addressed. Hard Choices booklet left for review. The family was encouraged to call with questions or concerns. PMT will continue to support holistically.  Review of Systems  Unable to perform ROS   Objective:   Primary Diagnoses: Present on Admission:  Osteomyelitis of foot, right, acute (HCC)  COPD (chronic obstructive pulmonary disease) (HCC)  COPD (chronic obstructive pulmonary disease) (HCC)  CVA (cerebral vascular accident) (HCC)  Depression  Chronic respiratory failure with hypoxia (HCC)  Dementia without behavioral disturbance (HCC)   Physical Exam Constitutional:      General: She is awake.  Pulmonary:     Effort: Pulmonary effort is normal.  Skin:    General: Skin is warm and dry.     Vital Signs:  BP (!) 145/80 (BP Location: Right  Arm)   Pulse 99   Temp 97.8 F (36.6 C) (Axillary)   Resp 16   Ht 5\' 2"  (1.575 m)   Wt 60.6 kg   SpO2 100%   BMI 24.44 kg/m   Palliative Assessment/Data: 30%    Advanced Care Planning:   Existing Vynca/ACP Documentation: MOST form completed 02/27/2022  Primary Decision Maker: Daughter Crystal Moore is the patient's primary decision maker. Her brothers and sons help her with decision making around her mother's care.  Code Status/Advance Care Planning: Full code   Assessment & Plan:   SUMMARY OF RECOMMENDATIONS   Full code/Full scope Daughter wants to proceed with amputation Spiritual care consult  Discussed with: Dr. Nelson Chimes and bedside RN  Time spent: 75 minutes  Thank you for allowing [place] to participate in the care of Crystal Moore PMT will continue to support holistically.   Signed by: Sarina Ser, NP Palliative Medicine Team  Team Phone # [phone] (Nights/Weekends)  03/26/2023, 10:27 AM

## 2023-03-26 NOTE — Progress Notes (Signed)
Initial Nutrition Assessment  DOCUMENTATION CODES:   Not applicable  INTERVENTION:  Change to automatic meal delivery; feeding with assistance Continue Regular diet as ordered [employer] Essentials TID, each packet mixed with 8 ounces of 2% milk provides 13 grams of protein and 260 calories. Magic cup TID with meals, each supplement provides 290 kcal and 9 grams of protein Vitamin C 500mg  daily Zinc 220mg  daily x 14 days Request updated measured weight  NUTRITION DIAGNOSIS:   Increased nutrient needs related to acute illness, wound healing as evidenced by estimated needs.  GOAL:   Patient will meet greater than or equal to 90% of their needs  MONITOR:   PO intake, Supplement acceptance, Labs, Weight trends, Skin  REASON FOR ASSESSMENT:   Malnutrition Screening Tool    ASSESSMENT:   Pt admitted with osteomyelitis of R foot. PMH significant for dementia, stage IV NSCLC on palliative radiation, CVA with L sided hemiparesis and contracture, PAD, ILD, COPD, gastric AVM, DM2 with R DM foot ulcer.   Palliative care following for GOC. Currently remains full code/scope of care. Noted possible plans to pursue AKA next week d/t osteomyelitis and PAD.   Attempted to visit pt at bedside. Unfortunately, unable to obtain detailed nutrition related history or nutrition focused physical exam at pt resting soundly, curled up in bed at time of visit. RN reports pt's daughter had just left. RN mentions that pt had not received breakfast this morning. Ensured pt was changed to automatic tray delivery given pt only oriented x1. Pt requiring feeding assistance with meals. RN reports that pt does well with beverage intake.    Per review of PMT assessment today, pt's daughter reports that she loves to eat. Her grandson ensures she eats a healthy diet and has no trouble swallowing.   Meal completions: 8/28: 0% breakfast, 80% dinner 8/29: 0% breakfast and lunch  Unfortunately there is  limited documentation of weight history on file to review within the last year. Most weight appear to be stated versus actual. Current admit weight documented to be 60.6 kg. Will request ongoing daily weights to assess ongoing changes.  Medications: SSI 0-5 at bedtime, SSI 0-9 units TID, semglee 10 units daily, medihoney, protonix, IV abx, D5 @ 25ml/hr  Labs: Cr 1.06, phos 2.3, GFR 54, CBG's 97-234 x24 hours, HgbA1c 6.1% (02/24/23)  NUTRITION - FOCUSED PHYSICAL EXAM: Deferred to follow up.   Diet Order:   Diet Order             Diet regular Room service appropriate? No; Fluid consistency: Thin  Diet effective now                   EDUCATION NEEDS:   No education needs have been identified at this time  Skin:  Skin Assessment: Skin Integrity Issues: Skin Integrity Issues:: Stage III, Unstageable Stage III: L hip Unstageable: R foot  Last BM:  8/30 smear  Height:   Ht Readings from Last 1 Encounters:  03/23/23 5\' 2"  (1.575 m)    Weight:   Wt Readings from Last 1 Encounters:  03/23/23 60.6 kg   BMI:  Body mass index is 24.44 kg/m.  Estimated Nutritional Needs:   Kcal:  1500-1700  Protein:  75-100g  Fluid:  >/=1.5L  Drusilla Kanner, RDN, LDN Clinical Nutrition

## 2023-03-26 NOTE — Hospital Course (Signed)
  Brief Narrative:  77 year old with history of dementia, stage IV NSCLC on palliative radiation, CVA with left-sided hemiparesis and contracture, PAD, ILD with hypoxia 2 L nasal cannula, COPD, gastric AVM, DM2 with right diabetic foot ulcer sent to the hospital by PCP due to concerns of osteomyelitis.  Upon admission MRI confirmed osteomyelitis of her foot, orthopedic was consulted who recommended also consulting vascular due to concerns of significant PAD.     Assessment & Plan:  Principal Problem:   Osteomyelitis of foot, right, acute (HCC) Active Problems:   COPD (chronic obstructive pulmonary disease) (HCC)   Chronic respiratory failure with hypoxia (HCC)   Depression   COPD (chronic obstructive pulmonary disease) (HCC)   CVA (cerebral vascular accident) (HCC)   Dementia without behavioral disturbance (HCC)   History of CVA (cerebrovascular accident)   Pressure injury of skin    Osteomyelitis of foot, right, acute:  This was noted on the x-ray at the PCPs office.  Cultures remain negative.  Inflammatory markers are mildly elevated.  MRI is consistent with osteomyelitis of the first metatarsal head, medial/lateral hallux sesamoid and base of the first proximal phalanx.  Orthopedic and vascular were consulted. -Unfortunately has very poor options besides palliative at this time.  Only option is really above-the-knee amputation which family would like to pursue -Seen by Palliative care.    Acute toxic encephalopathy: Received Ativan and Dilaudid prior to MRI.  Avoid sedating meds.  CT of the head is negative.  Supportive care. We may have to change her cefepime   Chronic seizure PD/ILD/chronic hypoxic RF:  Continue 2 L nasal cannula.   IDDM-2 with hyperglycemia and diabetic wound with osteomyelitis: sliding scale and Accu-Chek. Adjust insulin as needed.    History of CVA with residual left-sided weakness and spasticity -Continue home meds except Plavix -PT/OT   Essential  hypertension:  Norvasc, IV as needed   Hyperlipidemia -Continue home Lipitor   Dementia without behavioral disturbance: Seems advanced.  Awake and alert but only oriented to self. -Reorientation and delirium precaution   Normocytic anemia: H&H stable. Closely monitor hemoglobin   Goal of care counseling: Seen by palliative care service.  Family wishes her to be full code     DVT prophylaxis: enoxaparin (LOVENOX) injection 40 mg Start: 03/24/23 1000 Code Status: Full code Family Communication: Daughter at bedside Status is: Inpatient Remains inpatient appropriate because: Ongoing management for her right foot osteomyelitis

## 2023-03-26 NOTE — Progress Notes (Signed)
PROGRESS NOTE    Crystal Moore  ZOX:[id number] DOB: 01-26-1946 DOA: 03/23/2023 PCP: Lorenda Ishihara, MD   Brief Narrative:  77 year old with history of dementia, stage IV NSCLC on palliative radiation, CVA with left-sided hemiparesis and contracture, PAD, ILD with hypoxia 2 L nasal cannula, COPD, gastric AVM, DM2 with right diabetic foot ulcer sent to the hospital by PCP due to concerns of osteomyelitis.  Upon admission MRI confirmed osteomyelitis of her foot, orthopedic was consulted who recommended also consulting vascular due to concerns of significant PAD.   Assessment & Plan:  Principal Problem:   Osteomyelitis of foot, right, acute (HCC) Active Problems:   COPD (chronic obstructive pulmonary disease) (HCC)   Chronic respiratory failure with hypoxia (HCC)   Depression   COPD (chronic obstructive pulmonary disease) (HCC)   CVA (cerebral vascular accident) (HCC)   Dementia without behavioral disturbance (HCC)   History of CVA (cerebrovascular accident)   Pressure injury of skin    Osteomyelitis of foot, right, acute:  This was noted on the x-ray at the PCPs office.  Cultures remain negative.  Inflammatory markers are mildly elevated.  MRI is consistent with osteomyelitis of the first metatarsal head, medial/lateral hallux sesamoid and base of the first proximal phalanx.  Orthopedic and vascular were consulted. -Unfortunately has very poor options besides palliative at this time.  Only option is really above-the-knee amputation which family would like to pursue -Will get palliative care services involved   Acute toxic encephalopathy: Received Ativan and Dilaudid prior to MRI.  Avoid sedating meds.  CT of the head is negative.  Supportive care. We may have to change her cefepime   Chronic seizure PD/ILD/chronic hypoxic RF:  Continue 2 L nasal cannula.   IDDM-2 with hyperglycemia and diabetic wound with osteomyelitis: sliding scale and Accu-Chek   History of CVA with  residual left-sided weakness and spasticity -Continue home meds except Plavix -PT/OT   Essential hypertension:  Norvasc, IV as needed   Hyperlipidemia -Continue home Lipitor   Dementia without behavioral disturbance: Seems advanced.  Awake and alert but only oriented to self. -Reorientation and delirium precaution   Normocytic anemia: H&H stable. Closely monitor hemoglobin   Goal of care counseling: Seen by palliative care service.  Family wishes her to be full code   DVT prophylaxis: enoxaparin (LOVENOX) injection 40 mg Start: 03/24/23 1000 Code Status: Full code Family Communication: Daughter at bedside Status is: Inpatient Remains inpatient appropriate because: Ongoing management for her right foot osteomyelitis   Pressure Injury 03/23/23 Foot Right;Medial Unstageable - Full thickness tissue loss in which the base of the injury is covered by slough (yellow, tan, gray, green or brown) and/or eschar (tan, brown or black) in the wound bed. (Active)  03/23/23 2315  Location: Foot  Location Orientation: Right;Medial  Staging: Unstageable - Full thickness tissue loss in which the base of the injury is covered by slough (yellow, tan, gray, green or brown) and/or eschar (tan, brown or black) in the wound bed.  Wound Description (Comments):   Present on Admission: Yes     Pressure Injury 03/24/23 Hip Anterior;Left;Proximal Stage 3 -  Full thickness tissue loss. Subcutaneous fat may be visible but bone, tendon or muscle are NOT exposed. (Active)  03/24/23   Location: Hip  Location Orientation: Anterior;Left;Proximal  Staging: Stage 3 -  Full thickness tissue loss. Subcutaneous fat may be visible but bone, tendon or muscle are NOT exposed.  Wound Description (Comments):   Present on Admission: Yes     Diet  Orders (From admission, onward)     Start     Ordered   03/25/23 1203  Diet regular Room service appropriate? Yes; Fluid consistency: Thin  Diet effective now       Question  Answer Comment  Room service appropriate? Yes   Fluid consistency: Thin      03/25/23 1202            Subjective: Seen at bedside, no complaints.   Examination:  General exam: Appears calm and comfortable, elderly frail.  Contracted extremities Respiratory system: Clear to auscultation. Respiratory effort normal. Cardiovascular system: S1 & S2 heard, RRR. No JVD, murmurs, rubs, gallops or clicks. No pedal edema. Gastrointestinal system: Abdomen is nondistended, soft and nontender. No organomegaly or masses felt. Normal bowel sounds heard. Central nervous system: Alert and oriented. No focal neurological deficits. Extremities: Symmetric 5 x 5 power. Skin: Right foot dressing in place Psychiatry: Judgement and insight appear poor.  Alert to name  Objective: Vitals:   03/25/23 0523 03/25/23 1428 03/25/23 2017 03/25/23 2110  BP: (!) 123/51 (!) 126/54 (!) 165/69 (!) 145/80  Pulse: 79 82 99   Resp: 15 18 16 16   Temp: 98.1 F (36.7 C) 98 F (36.7 C) 97.8 F (36.6 C) 97.8 F (36.6 C)  TempSrc: Oral   Axillary  SpO2: 97%  93% 100%  Weight:      Height:        Intake/Output Summary (Last 24 hours) at 03/26/2023 0907 Last data filed at 03/26/2023 0500 Gross per 24 hour  Intake 554.18 ml  Output 500 ml  Net 54.18 ml   Filed Weights   03/23/23 1641  Weight: 60.6 kg    Scheduled Meds:  amLODipine  5 mg Oral Daily   atorvastatin  80 mg Oral QHS   enoxaparin (LOVENOX) injection  40 mg Subcutaneous Q24H   gabapentin  300 mg Oral TID   insulin aspart  0-5 Units Subcutaneous QHS   insulin aspart  0-9 Units Subcutaneous TID WC   insulin glargine-yfgn  10 Units Subcutaneous q AM   leptospermum manuka honey  1 Application Topical Daily   pantoprazole  40 mg Oral BID   Continuous Infusions:  ceFEPime (MAXIPIME) IV 2 g (03/25/23 2256)   dextrose 5 % and 0.9 % NaCl 75 mL/hr at 03/26/23 0047   vancomycin 1,000 mg (03/25/23 2304)    Nutritional status     Body mass  index is 24.44 kg/m.  Data Reviewed:   CBC: Recent Labs  Lab 03/23/23 2021 03/24/23 0357 03/26/23 0418  WBC 6.9 7.7 7.6  NEUTROABS 5.1 5.7  --   HGB 9.3* 8.1* 8.3*  HCT 30.4* 26.4* 26.9*  MCV 79.2* 80.0 77.3*  PLT 278 280 295   Basic Metabolic Panel: Recent Labs  Lab 03/23/23 2021 03/24/23 0357 03/26/23 0418  NA 138 139 137  K 4.0 3.6 3.9  CL 104 103 107  CO2 27 26 23   GLUCOSE 213* 167* 224*  BUN 27* 19 11  CREATININE 0.85 0.64 1.06*  CALCIUM 8.9 9.2 8.9  MG  --  1.9 1.7  PHOS  --   --  2.3*   GFR: Estimated Creatinine Clearance: 38.7 mL/min (A) (by C-G formula based on SCr of 1.06 mg/dL (H)). Liver Function Tests: Recent Labs  Lab 03/23/23 2021 03/26/23 0418  AST 14*  --   ALT 15  --   ALKPHOS 82  --   BILITOT 0.2*  --   PROT 7.4  --  ALBUMIN 2.6* 2.3*   No results for input(s): "LIPASE", "AMYLASE" in the last 168 hours. Recent Labs  Lab 03/25/23 1524  AMMONIA 31   Coagulation Profile: No results for input(s): "INR", "PROTIME" in the last 168 hours. Cardiac Enzymes: No results for input(s): "CKTOTAL", "CKMB", "CKMBINDEX", "TROPONINI" in the last 168 hours. BNP (last 3 results) No results for input(s): "PROBNP" in the last 8760 hours. HbA1C: No results for input(s): "HGBA1C" in the last 72 hours. CBG: Recent Labs  Lab 03/25/23 1704 03/25/23 1734 03/25/23 2241 03/26/23 0606 03/26/23 0744  GLUCAP 61* 129* 97 234* 213*   Lipid Profile: No results for input(s): "CHOL", "HDL", "LDLCALC", "TRIG", "CHOLHDL", "LDLDIRECT" in the last 72 hours. Thyroid Function Tests: Recent Labs    03/25/23 1524  TSH 1.305   Anemia Panel: Recent Labs    03/25/23 1254  VITAMINB12 601   Sepsis Labs: Recent Labs  Lab 03/23/23 2253  LATICACIDVEN 1.0    Recent Results (from the past 240 hour(s))  Blood Cultures x 2 sites     Status: None (Preliminary result)   Collection Time: 03/23/23  8:21 PM   Specimen: BLOOD  Result Value Ref Range Status    Specimen Description   Final    BLOOD SITE NOT SPECIFIED Performed at [facility], [address], [place], [place] [address]    Special Requests   Final    BOTTLES DRAWN AEROBIC AND ANAEROBIC Blood Culture adequate volume Performed at [facility], [address], [place], [place] [address]    Culture   Final    NO GROWTH 2 DAYS Performed at [facility], [address] [place], [place] [address]    Report Status PENDING  Incomplete  Blood Cultures x 2 sites     Status: None (Preliminary result)   Collection Time: 03/24/23 12:13 AM   Specimen: BLOOD RIGHT ARM  Result Value Ref Range Status   Specimen Description   Final    BLOOD RIGHT ARM Performed at [facility], [address] [place], [place] [address]    Special Requests   Final    BOTTLES DRAWN AEROBIC ONLY Blood Culture results may not be optimal due to an inadequate volume of blood received in culture bottles Performed at [facility], [address] [place], [place] [address]    Culture   Final    NO GROWTH 2 DAYS Performed at [facility], [address], [place], [place] [address]    Report Status PENDING  Incomplete         Radiology Studies: CT HEAD WO CONTRAST ( )  Result Date: 03/25/2023 CLINICAL DATA:  Altered mental status EXAM: CT HEAD WITHOUT CONTRAST TECHNIQUE: Contiguous axial images were obtained from the base of the skull through the vertex without intravenous contrast. RADIATION DOSE REDUCTION: This exam was performed according to the departmental dose-optimization program which includes automated exposure control, adjustment of the mA and/or kV according to patient size and/or use of iterative reconstruction technique. COMPARISON:  None Available. FINDINGS: Brain: There is no mass, hemorrhage or extra-axial collection. There is generalized atrophy without lobar predilection. Hypodensity of the white matter is most commonly  associated with chronic microvascular disease. Old small vessel infarct of the right cerebellum. Old right frontal infarct. Vascular: Atherosclerotic calcification of the vertebral and internal carotid arteries at the skull base. No abnormal hyperdensity of the major intracranial arteries or dural venous sinuses. Skull: The visualized skull base, calvarium  and extracranial soft tissues are normal. Sinuses/Orbits: No fluid levels or advanced mucosal thickening of the visualized paranasal sinuses. No mastoid or middle ear effusion. The orbits are normal. IMPRESSION: 1. No acute intracranial abnormality. 2. Advanced atrophy and chronic microvascular ischemia. 3. Old right frontal and right cerebellar infarcts. Electronically Signed   By: Deatra Robinson M.D.   On: 03/25/2023 22:48   VAS [place] ABI WITH/WO TBI  Result Date: 03/25/2023  LOWER EXTREMITY DOPPLER STUDY Patient Name:  Crystal Moore  Date of Exam:   03/25/2023 Medical Rec #: [id number]        Accession #:    [id number] Date of Birth: 10-28-1945         Patient Gender: F Patient Age:   22 years Exam Location:  [facility] Procedure:      VAS [place] ABI WITH/WO TBI Referring Phys: Alwyn Ren GONFA --------------------------------------------------------------------------------  Indications: Peripheral artery disease. High Risk Factors: Diabetes.  Limitations: Today's exam was limited due to bandages , an open wound, patient              positioning and patient immobility, left arm contracture. Comparison Study: 03/10/2022 - Right: Resting right ankle-brachial index                   indicates severe right lower                   extremity arterial disease.                    Left: Resting left ankle-brachial index indicates moderate                   left lower                   extremity arterial disease. Performing Technologist: Olen Cordial RVT  Examination Guidelines: A complete evaluation includes at minimum, Doppler waveform signals and systolic blood pressure  reading at the level of bilateral brachial, anterior tibial, and posterior tibial arteries, when vessel segments are accessible. Bilateral testing is considered an integral part of a complete examination. Photoelectric Plethysmograph (PPG) waveforms and toe systolic pressure readings are included as required and additional duplex testing as needed. Limited examinations for reoccurring indications may be performed as noted.  ABI Findings: +---------+------------------+-----+----------+------------------+ Right    Rt Pressure (mmHg)IndexWaveform  Comment            +---------+------------------+-----+----------+------------------+ Brachial 140                    triphasic                    +---------+------------------+-----+----------+------------------+ PTA                                       Open wound/bandage +---------+------------------+-----+----------+------------------+ DP       56                0.40 monophasic                   +---------+------------------+-----+----------+------------------+ Great Toe                                 Open wound/bandage +---------+------------------+-----+----------+------------------+ +---------+------------------+-----+----------+-----------+ Left     Lt Pressure (mmHg)IndexWaveform  Comment     +---------+------------------+-----+----------+-----------+  Brachial                                  Contracture +---------+------------------+-----+----------+-----------+ PTA      70                0.50 monophasic            +---------+------------------+-----+----------+-----------+ DP       63                0.45 monophasic            +---------+------------------+-----+----------+-----------+ Great Toe33                0.24                       +---------+------------------+-----+----------+-----------+ +-------+-----------+-----------+------------+------------+ ABI/TBIToday's ABIToday's TBIPrevious  ABIPrevious TBI +-------+-----------+-----------+------------+------------+ Right  0.4                                            +-------+-----------+-----------+------------+------------+ Left   0.5        0.24                                +-------+-----------+-----------+------------+------------+  Summary: Right: Resting right ankle-brachial index indicates severe right lower extremity arterial disease. Unable to obtain TBI due to great toe wound. Left: Resting left ankle-brachial index indicates moderate left lower extremity arterial disease. The left toe-brachial index is abnormal. *See table(s) above for measurements and observations.     Preliminary    MR FOOT RIGHT W WO CONTRAST  Result Date: 03/25/2023 CLINICAL DATA:  Right foot wound with osteomyelitis. EXAM: MRI OF THE RIGHT FOREFOOT WITHOUT AND WITH CONTRAST TECHNIQUE: Multiplanar, multisequence MR imaging of the right forefoot was performed before and after the administration of intravenous contrast. CONTRAST:  6mL GADAVIST GADOBUTROL 1 MMOL/ML IV SOLN COMPARISON:  Right foot x-rays dated March 23, 2023. FINDINGS: Bones/Joint/Cartilage Abnormal marrow edema with corresponding mildly decreased T1 marrow signal involving the first metatarsal head, medial and lateral hallux sesamoids, and base of the first proximal phalanx. Remaining marrow signal is normal. No fracture or dislocation. Joint spaces are preserved. No joint effusion. Ligaments Collateral ligaments are intact. Muscles and Tendons Flexor and extensor tendons are intact. No tenosynovitis. Increased T2 signal within and atrophy the intrinsic muscles of the forefoot, nonspecific, but likely related to diabetic muscle changes. Soft tissue Soft tissue ulceration along the medial aspect of the first metatarsal head. Surrounding soft tissue swelling extending into the dorsal foot. No fluid collection. No soft tissue mass. IMPRESSION: 1. Soft tissue ulceration along the medial  aspect of the first metatarsal head with underlying osteomyelitis of the first metatarsal head, medial and lateral hallux sesamoids, and base of the first proximal phalanx. 2. No abscess. Electronically Signed   By: Obie Dredge M.D.   On: 03/25/2023 08:17           LOS: 3 days   Time spent= 35 mins    Miguel Rota, MD [facility]  If 7PM-7AM, please contact night-coverage  03/26/2023, 9:07 AM

## 2023-03-26 NOTE — Care Management Important Message (Signed)
Important Message  Patient Details  Name: Crystal Moore MRN: [id number] Date of Birth: 20-Jan-1946   Medicare Important Message Given:  Yes     Sherilyn Banker 03/26/2023, 3:07 PM

## 2023-03-26 NOTE — Progress Notes (Signed)
  Plan for Right AKA Tuesday at the request of family pending OR availability.    Victorino Sparrow MD

## 2023-03-26 NOTE — Progress Notes (Signed)
Pt has arrived on unit. Pt is alert and oriented to self,  vital signs are stable.

## 2023-03-27 DIAGNOSIS — M86171 Other acute osteomyelitis, right ankle and foot: Secondary | ICD-10-CM | POA: Diagnosis not present

## 2023-03-27 DIAGNOSIS — Z515 Encounter for palliative care: Secondary | ICD-10-CM | POA: Diagnosis not present

## 2023-03-27 LAB — CBC
HCT: 27.4 % — ABNORMAL LOW (ref 36.0–46.0)
Hemoglobin: 8.4 g/dL — ABNORMAL LOW (ref 12.0–15.0)
MCH: 23.9 pg — ABNORMAL LOW (ref 26.0–34.0)
MCHC: 30.7 g/dL (ref 30.0–36.0)
MCV: 78.1 fL — ABNORMAL LOW (ref 80.0–100.0)
Platelets: 297 10*3/uL (ref 150–400)
RBC: 3.51 MIL/uL — ABNORMAL LOW (ref 3.87–5.11)
RDW: 17.1 % — ABNORMAL HIGH (ref 11.5–15.5)
WBC: 8.4 10*3/uL (ref 4.0–10.5)
nRBC: 0 % (ref 0.0–0.2)

## 2023-03-27 LAB — BASIC METABOLIC PANEL
Anion gap: 8 (ref 5–15)
BUN: 7 mg/dL — ABNORMAL LOW (ref 8–23)
CO2: 24 mmol/L (ref 22–32)
Calcium: 9.1 mg/dL (ref 8.9–10.3)
Chloride: 105 mmol/L (ref 98–111)
Creatinine, Ser: 0.62 mg/dL (ref 0.44–1.00)
GFR, Estimated: >60 mL/min (ref >60)
Glucose, Bld: 170 mg/dL — ABNORMAL HIGH (ref 70–99)
Potassium: 3.5 mmol/L (ref 3.5–5.1)
Sodium: 137 mmol/L (ref 135–145)

## 2023-03-27 LAB — GLUCOSE, CAPILLARY
Glucose-Capillary: 195 mg/dL — ABNORMAL HIGH (ref 70–99)
Glucose-Capillary: 182 mg/dL — ABNORMAL HIGH (ref 70–99)
Glucose-Capillary: 126 mg/dL — ABNORMAL HIGH (ref 70–99)
Glucose-Capillary: 158 mg/dL — ABNORMAL HIGH (ref 70–99)

## 2023-03-27 LAB — PHOSPHORUS: Phosphorus: 2.3 mg/dL — ABNORMAL LOW (ref 2.5–4.6)

## 2023-03-27 LAB — MAGNESIUM: Magnesium: 1.6 mg/dL — ABNORMAL LOW (ref 1.7–2.4)

## 2023-03-27 MED ORDER — MAGNESIUM SULFATE 4 GM/100ML IV SOLN
4.0000 g | Freq: Once | INTRAVENOUS | Status: AC
  Administered 2023-03-27: 4 g via INTRAVENOUS
  Filled 2023-03-27: qty 100

## 2023-03-27 MED ORDER — POTASSIUM CHLORIDE 10 MEQ/100ML IV SOLN
10.0000 meq | INTRAVENOUS | Status: AC
  Filled 2023-03-27: qty 100

## 2023-03-27 MED ORDER — POTASSIUM CHLORIDE 10 MEQ/100ML IV SOLN
10.0000 meq | INTRAVENOUS | Status: AC
  Administered 2023-03-27 (×3): 10 meq via INTRAVENOUS
  Filled 2023-03-27 (×2): qty 100

## 2023-03-27 MED ORDER — MAGNESIUM SULFATE 4 GM/100ML IV SOLN
4.0000 g | Freq: Once | INTRAVENOUS | Status: DC
  Filled 2023-03-27: qty 100

## 2023-03-27 NOTE — Progress Notes (Signed)
Pharmacy Antibiotic Note  Crystal Moore is a 77 y.o. female admitted on 03/23/2023 with R toe  osteomyelitis .  Pharmacy has been consulted for Cefepime + Vancomycin dosing.  SCr 0.62, WBC 8.4, afebrile  Plan: Continue Cefepime 2gm IV q12h Continue Vancomycin 1000mg  IV q24h (eAUC ~524)    > Goal AUC 400-550    > Check vancomycin levels after 22:00 dose on 8/31 Monitor daily CBC, temp, SCr Plan for R AKA on 9/3 with Dr. Karin Lieu F/u duration of antibiotic therapy post-op  Height: 5\' 2"  (157.5 cm) Weight: 60.6 kg (133 lb 9.6 oz) IBW/kg (Calculated) : 50.1  Temp (24hrs), Avg:97.9 F (36.6 C), Min:97.7 F (36.5 C), Max:98.4 F (36.9 C)  Recent Labs  Lab 03/23/23 2021 03/23/23 2253 03/24/23 0357 03/26/23 0418 03/27/23 0242  WBC 6.9  --  7.7 7.6 8.4  CREATININE 0.85  --  0.64 1.06* 0.62  LATICACIDVEN  --  1.0  --   --   --     Estimated Creatinine Clearance: 51.3 mL/min (by C-G formula based on SCr of 0.62 mg/dL).    Allergies  Allergen Reactions   Aspirin Other (See Comments)    "Cannot take due to previous GI bleed," per daughter      Antimicrobials this admission: 8/27 Vancomycin >>  8/27 Cefepime >>   Dose adjustments this admission: N/A  Microbiology results: 8/27 BCx: NGTD x3d  Thank you for allowing pharmacy to be a part of this patient's care.  Wilburn Cornelia, PharmD, BCPS Clinical Pharmacist 03/27/2023 9:45 AM   Please refer to AMION for pharmacy phone number

## 2023-03-27 NOTE — Evaluation (Addendum)
Occupational Therapy Evaluation Patient Details Name: Crystal Moore MRN: [id number] DOB: [birth year] Today's Date: 03/27/2023   History of Present Illness The pt is a 77 yo female presenting 8/27 with R foot wounds concerning for osteomyelitis. Plan for R AKA on 9/3. PMH: multiple prior strokes with residual left-sided weakness, COPD, PVD, gastric AVMs, Stage IV non-small cell lung cancer on palliative radiation, interstitial lung disease with chronic hypoxemia on 2L, diabetes, and dementia.   Clinical Impression   Pt admitted for above, per report pt is bed bound and occassionally gets in her w/c. Pt family has a lot of DME and provides care needed for patient based on previous notes, unsure of patient's baseline due to her questionable cognition. Pt demonstrates need for assist with feeding due to LUE deficits and needs Max to total A for ADLs. Pt sitting balance varies from min to mod A. Anticipate family will decline SNF based on last admission notes. OT to continue to work with pt while in acute stay to help transition home and maintain current level of functioning. No follow-up OT needed. Will reassess pt functional capacities after planned R AKA.       If plan is discharge home, recommend the following: A lot of help with walking and/or transfers;A lot of help with bathing/dressing/bathroom;Direct supervision/assist for financial management;Assistance with cooking/housework;Assistance with feeding;Help with stairs or ramp for entrance;Direct supervision/assist for medications management;Assist for transportation;Supervision due to cognitive status    Functional Status Assessment  Patient has had a recent decline in their functional status and demonstrates the ability to make significant improvements in function in a reasonable and predictable amount of time.  Equipment Recommendations  None recommended by OT (family has rec DME per report/notes)    Recommendations for Other Services        Precautions / Restrictions Precautions Precautions: Fall Precaution Comments: significant contractures from hx of CVA with L-sided weakness. PRAFO boots bilaterally Restrictions Weight Bearing Restrictions: No      Mobility Bed Mobility Overal bed mobility: Needs Assistance Bed Mobility: Supine to Sit, Sit to Supine     Supine to sit: Total assist, +2 for physical assistance Sit to supine: Total assist, +2 for physical assistance   General bed mobility comments: totalA to manage LE and trunk, pt able to tolerate with VSS and then hold balance without assist.    Transfers                   General transfer comment: deferred as per chart pt is bedbound and family has hoyer lift, was able to progress LE knee ROM to 90 deg but not much past 90 deg flexion so unable to stand.      Balance Overall balance assessment: Needs assistance Sitting-balance support: No upper extremity supported, Feet unsupported Sitting balance-Leahy Scale: Fair Sitting balance - Comments: can static sit, min-modA needed for movement of UE Postural control: Posterior lean, Left lateral lean                                 ADL either performed or assessed with clinical judgement   ADL Overall ADL's : Needs assistance/impaired Eating/Feeding: Set up;Bed level Eating/Feeding Details (indicate cue type and reason): Uses RUE to feed self but not able to produce any movement from LUE Grooming: Sitting;Minimal assistance   Upper Body Bathing: Sitting;Maximal assistance   Lower Body Bathing: Sitting/lateral leans;Maximal assistance   Upper Body Dressing :  Maximal assistance;Sitting   Lower Body Dressing: Total assistance     Toilet Transfer Details (indicate cue type and reason): NT   Toileting - Clothing Manipulation Details (indicate cue type and reason): NT             Vision         Perception         Praxis         Pertinent Vitals/Pain Pain  Assessment Pain Assessment: Faces Faces Pain Scale: Hurts a little bit Pain Location: with ROM of LE Pain Descriptors / Indicators: Discomfort, Grimacing Pain Intervention(s): Monitored during session, Limited activity within patient's tolerance, Repositioned     Extremity/Trunk Assessment Upper Extremity Assessment Upper Extremity Assessment: LUE deficits/detail;RUE deficits/detail RUE Deficits / Details: Generally weak, PROM of 95 degrees shoulder flexion and horizonal abduction LUE Deficits / Details: L arm flaccd, 1" shoulder sublux. Scarring of skin noted, similar appearance to wounds recovering from a burn, noted on palm and dorsal aspect on thenar eminence. Unsure of cause of L hand wounds LUE: Subluxation noted   Lower Extremity Assessment Lower Extremity Assessment: RLE deficits/detail;LLE deficits/detail RLE Deficits / Details: wounds to RLE, pt grimacing with touch. knee ROM limited to ~95 deg flexion. no active movement of foot/knee to command LLE Deficits / Details: knee ROM limited to ~90 deg flexion, no active movement to command   Cervical / Trunk Assessment Cervical / Trunk Assessment: Kyphotic   Communication Communication Communication: No apparent difficulties Cueing Techniques: Verbal cues;Tactile cues;Visual cues;Gestural cues   Cognition Arousal: Lethargic Behavior During Therapy: Flat affect Overall Cognitive Status: No family/caregiver present to determine baseline cognitive functioning                                 General Comments: pt with dementia at baseline, states year is 57 (her birth year) and then that she is "[age]" years old. states month is february, no family present to confirm this is her baseline.     General Comments  significant contractures in LUE and BLE (suspect chronic). as well as wounds to L hand/palm. pt unsure of cause. no family present    Exercises     Shoulder Instructions      Home Living  Family/patient expects to be discharged to:: Private residence Living Arrangements: Children Available Help at Discharge: Family;Home health;Available 24 hours/day Type of Home: House Home Access: Stairs to enter [employer] of Steps: 1 Entrance Stairs-Rails: None Home Layout: Two level;Able to live on main level with bedroom/bathroom     Bathroom Shower/Tub: Chief Strategy Officer: Standard     Home Equipment: Cane - single point;Wheelchair - manual;Hand held shower head;Tub bench;BSC/3in1;Cane - quad;Grab bars - tub/shower;Transport chair;Hospital bed;Other (comment)   Additional Comments: PLOF and home setup obtained from prior notes, pt unable to assist reporting that she lives alone      Prior Functioning/Environment Prior Level of Function : Needs assist;Patient poor historian/Family not available             Mobility Comments: Pt reports being bed bound but does get in w/c-likely via hoyer ADLs Comments: Pt reports feeing herself but has assist with all other ADLs        OT Problem List: Decreased strength;Impaired balance (sitting and/or standing);Decreased cognition;Impaired UE functional use;Pain;Decreased range of motion      OT Treatment/Interventions: Self-care/ADL training;Balance training;Therapeutic exercise;Therapeutic activities;Patient/family education    OT Goals(Current  goals can be found in the care plan section) Acute Rehab OT Goals OT Goal Formulation: Patient unable to participate in goal setting Time For Goal Achievement: 04/09/23 Potential to Achieve Goals: Good ADL Goals Additional ADL Goal #1: pt will demonstrate improved RUE PROM to 120 degrees shoulder flexion to help with joint intergrity. Additional ADL Goal #2: Pt family will demonstrate independent use of equipment and techniques to safely transfer pt to/from wheelchair prn  OT Frequency: Min 1X/week    Co-evaluation PT/OT/SLP Co-Evaluation/Treatment:  Yes Reason for Co-Treatment: Necessary to address cognition/behavior during functional activity;For patient/therapist safety;To address functional/ADL transfers PT goals addressed during session: Mobility/safety with mobility;Balance;Strengthening/ROM OT goals addressed during session: ADL's and self-care;Proper use of Adaptive equipment and DME      AM-PAC OT "6 Clicks" Daily Activity     Outcome Measure Help from another person eating meals?: A Little Help from another person taking care of personal grooming?: A Little Help from another person toileting, which includes using toliet, bedpan, or urinal?: Total Help from another person bathing (including washing, rinsing, drying)?: A Lot Help from another person to put on and taking off regular upper body clothing?: A Lot Help from another person to put on and taking off regular lower body clothing?: Total 6 Click Score: 12   End of Session Nurse Communication: Mobility status  Activity Tolerance: Patient tolerated treatment well Patient left: with call bell/phone within reach;in bed  OT Visit Diagnosis: Muscle weakness (generalized) (M62.81);Pain Pain - Right/Left:  (bilat) Pain - part of body: Leg                Time: 1140-1211 OT Time Calculation (min): 31 min Charges:  OT General Charges $OT Visit: 1 Visit OT Evaluation $OT Eval Moderate Complexity: 1 Mod  03/27/2023  AB, OTR/L  Acute Rehabilitation Services  Office: [phone]   Tristan Schroeder 03/27/2023, 3:02 PM

## 2023-03-27 NOTE — Plan of Care (Signed)
  Problem: Education: Goal: Ability to describe self-care measures that may prevent or decrease complications (Diabetes Survival Skills Education) will improve Outcome: Progressing Goal: Individualized Educational Video(s) Outcome: Progressing   Problem: Coping: Goal: Ability to adjust to condition or change in health will improve Outcome: Progressing   Problem: Fluid Volume: Goal: Ability to maintain a balanced intake and output will improve Outcome: Progressing   Problem: Health Behavior/Discharge Planning: Goal: Ability to identify and utilize available resources and services will improve Outcome: Progressing Goal: Ability to manage health-related needs will improve Outcome: Progressing   Problem: Metabolic: Goal: Ability to maintain appropriate glucose levels will improve Outcome: Progressing   Problem: Nutritional: Goal: Maintenance of adequate nutrition will improve Outcome: Progressing Goal: Progress toward achieving an optimal weight will improve Outcome: Progressing   Problem: Skin Integrity: Goal: Risk for impaired skin integrity will decrease Outcome: Progressing   Problem: Tissue Perfusion: Goal: Adequacy of tissue perfusion will improve Outcome: Progressing   Problem: Education: Goal: Knowledge of General Education information will improve Description: Including pain rating scale, medication(s)/side effects and non-pharmacologic comfort measures Outcome: Progressing   Problem: Health Behavior/Discharge Planning: Goal: Ability to manage health-related needs will improve Outcome: Progressing   Problem: Clinical Measurements: Goal: Ability to maintain clinical measurements within normal limits will improve Outcome: Progressing Goal: Will remain free from infection Outcome: Progressing Goal: Diagnostic test results will improve Outcome: Progressing Goal: Respiratory complications will improve Outcome: Progressing Goal: Cardiovascular complication will  be avoided Outcome: Progressing   Problem: Nutrition: Goal: Adequate nutrition will be maintained Outcome: Progressing   Problem: Coping: Goal: Level of anxiety will decrease Outcome: Progressing   Problem: Elimination: Goal: Will not experience complications related to bowel motility Outcome: Progressing   Problem: Pain Managment: Goal: General experience of comfort will improve Outcome: Progressing   Problem: Safety: Goal: Ability to remain free from injury will improve Outcome: Progressing   Problem: Skin Integrity: Goal: Risk for impaired skin integrity will decrease Outcome: Progressing

## 2023-03-27 NOTE — Evaluation (Signed)
Physical Therapy Evaluation Patient Details Name: Crystal Moore MRN: [id number] DOB: July 23, 1946 Today's Date: 03/27/2023  History of Present Illness  The pt is a 77 yo female presenting 8/27 with R foot wounds concerning for osteomyelitis. Plan for R AKA on 9/3. PMH: multiple prior strokes with residual left-sided weakness, COPD, PVD, gastric AVMs, Stage IV non-small cell lung cancer on palliative radiation, interstitial lung disease with chronic hypoxemia on 2L, diabetes, and dementia.   Clinical Impression  Pt in bed upon arrival of PT, agreeable to evaluation at this time. Prior to admission the pt was primarily assisted by family and in bed or WC, pt's chart notes the family has hoyer lift they can use for transfers as well. No family present for evaluation today and pt unable to consistently answer questions. The pt required totalA to complete transition to sitting EOB, but was then able to maintain static sitting with CGA, needed min-modA when moving UE. Pt presents with chronic bilateral knee contractures, but is able to achieve just over 90deg knee flexion to allow for sitting EOB. Will re-assess after planned AKA, at last admission pt's family was adamant about return home. If they would like to return home again, sounds like they have all DME needed. Pt will continue to need assist with all bed mobility and transfers.      If plan is discharge home, recommend the following: A lot of help with walking and/or transfers;A lot of help with bathing/dressing/bathroom;Assistance with feeding;Direct supervision/assist for medications management;Direct supervision/assist for financial management;Help with stairs or ramp for entrance;Assist for transportation   Can travel by private vehicle        Equipment Recommendations None recommended by PT (pt has needed DME)  Recommendations for Other Services       Functional Status Assessment Patient has had a recent decline in their functional status  and demonstrates the ability to make significant improvements in function in a reasonable and predictable amount of time.     Precautions / Restrictions Precautions Precautions: Fall Precaution Comments: significant contractures from hx of CVA with L-sided weakness. PRAFO boots bilaterally Restrictions Weight Bearing Restrictions: No      Mobility  Bed Mobility Overal bed mobility: Needs Assistance Bed Mobility: Supine to Sit, Sit to Supine     Supine to sit: Total assist, +2 for physical assistance Sit to supine: Total assist, +2 for physical assistance   General bed mobility comments: totalA to manage LE and trunk, pt able to tolerate with VSS and then hold balance without assist.    Transfers                   General transfer comment: deferred as per chart pt is bedbound and family has hoyer lift, was able to progress LE knee ROM to 90 deg but not much past 90 deg flexion so unable to stand.       Balance Overall balance assessment: Needs assistance Sitting-balance support: No upper extremity supported, Feet unsupported Sitting balance-Leahy Scale: Fair Sitting balance - Comments: can static sit, min-modA needed for movement of UE Postural control: Posterior lean, Left lateral lean                                   Pertinent Vitals/Pain Pain Assessment Pain Assessment: Faces Faces Pain Scale: Hurts a little bit Pain Location: with ROM of LE Pain Descriptors / Indicators: Discomfort, Grimacing Pain Intervention(s): Limited activity  within patient's tolerance, Monitored during session, Repositioned    Home Living Family/patient expects to be discharged to:: Private residence Living Arrangements: Children Available Help at Discharge: Family;Home health;Available 24 hours/day Type of Home: House Home Access: Stairs to enter Entrance Stairs-Rails: None Entrance Stairs-Number of Steps: 1   Home Layout: Two level;Able to live on main level  with bedroom/bathroom Home Equipment: Gil[employer] Mor - single point;Wheelchair - manual;Hand held shower head;Tub bench;BSC/3in1;Cane - quad;Grab bars - tub/shower;Transport chair;Hospital bed;Other (comment) (hoyer lift) Additional Comments: PLOF and home setup obtained from prior notes, pt unable to assist    Prior Function Prior Level of Function : Needs assist;Patient poor historian/Family not available             Mobility Comments: Pt reports being bed bound but does get in w/c-likely via hoyer ADLs Comments: Pt reports feeing herself but has assist with all other ADLs     Extremity/Trunk Assessment   Upper Extremity Assessment Upper Extremity Assessment: Defer to OT evaluation    Lower Extremity Assessment Lower Extremity Assessment: RLE deficits/detail;LLE deficits/detail RLE Deficits / Details: wounds to RLE, pt grimacing with touch. knee ROM limited to ~95 deg flexion. no active movement of foot/knee to command LLE Deficits / Details: knee ROM limited to ~90 deg flexion, no active movement to command    Cervical / Trunk Assessment Cervical / Trunk Assessment: Kyphotic  Communication   Communication Communication: No apparent difficulties Cueing Techniques: Verbal cues;Tactile cues;Visual cues;Gestural cues  Cognition Arousal: Lethargic Behavior During Therapy: Flat affect Overall Cognitive Status: No family/caregiver present to determine baseline cognitive functioning                                 General Comments: pt with dementia at baseline, states year is 40 (her birth year) and then that she is "[age]" years old. states month is february, no family present to confirm this is her baseline.        General Comments General comments (skin integrity, edema, etc.): significant contractures in LUE and BLE (suspect chronic). as well as wounds to L hand/palm. pt unsure of cause. no family present    Exercises General Exercises - Lower  Extremity Ankle Circles/Pumps: PROM, 10 reps, Seated Long Arc Quad: PROM, Both, 10 reps, Seated   Assessment/Plan    PT Assessment Patient needs continued PT services  PT Problem List Decreased range of motion;Decreased strength;Decreased activity tolerance;Decreased balance;Decreased coordination;Decreased mobility       PT Treatment Interventions DME instruction;Functional mobility training;Therapeutic activities;Therapeutic exercise;Balance training;Patient/family education    PT Goals (Current goals can be found in the Care Plan section)  Acute Rehab PT Goals Patient Stated Goal: none stated PT Goal Formulation: Patient unable to participate in goal setting Time For Goal Achievement: 04/09/23 Potential to Achieve Goals: Good    Frequency Min 1X/week     Co-evaluation PT/OT/SLP Co-Evaluation/Treatment: Yes Reason for Co-Treatment: Necessary to address cognition/behavior during functional activity;For patient/therapist safety;To address functional/ADL transfers PT goals addressed during session: Mobility/safety with mobility;Balance;Strengthening/ROM         AM-PAC PT "6 Clicks" Mobility  Outcome Measure Help needed turning from your back to your side while in a flat bed without using bedrails?: Total Help needed moving from lying on your back to sitting on the side of a flat bed without using bedrails?: Total Help needed moving to and from a bed to a chair (including a wheelchair)?: Total Help needed standing  up from a chair using your arms (e.g., wheelchair or bedside chair)?: Total Help needed to walk in hospital room?: Total Help needed climbing 3-5 steps with a railing? : Total 6 Click Score: 6    End of Session Equipment Utilized During Treatment: Oxygen Activity Tolerance: Patient tolerated treatment well Patient left: in bed;with call bell/phone within reach;with bed alarm set Nurse Communication: Mobility status PT Visit Diagnosis: Muscle weakness  (generalized) (M62.81)    Time: 0981-1914 PT Time Calculation (min) (ACUTE ONLY): 31 min   Charges:   PT Evaluation $PT Eval Moderate Complexity: 1 Mod   PT General Charges $$ ACUTE PT VISIT: 1 Visit         Vickki Muff, PT, DPT   Acute Rehabilitation Department Office [phone] Secure Chat Communication Preferred  Ronnie Derby 03/27/2023, 2:11 PM

## 2023-03-27 NOTE — Progress Notes (Signed)
Daily Progress Note   Patient Name: Crystal Moore       Date: 03/27/2023 DOB: September 13, 1945  Age: 77 y.o. MRN#: [id number] Attending Physician: Miguel Rota, MD Primary Care Physician: Lorenda Ishihara, MD Admit Date: 03/23/2023  Reason for Consultation/Follow-up: Establishing goals of care  Subjective: Patient sitting in bed in NAD. RN feeding her breakfast.  Length of Stay: 4  Current Medications: Scheduled Meds:   amLODipine  5 mg Oral Daily   ascorbic acid  500 mg Oral Daily   atorvastatin  80 mg Oral QHS   enoxaparin (LOVENOX) injection  40 mg Subcutaneous Q24H   gabapentin  300 mg Oral TID   insulin aspart  0-5 Units Subcutaneous QHS   insulin aspart  0-9 Units Subcutaneous TID WC   insulin glargine-yfgn  5 Units Subcutaneous q AM   leptospermum manuka honey  1 Application Topical Daily   pantoprazole  40 mg Oral BID   zinc sulfate  220 mg Oral Daily    Continuous Infusions:  ceFEPime (MAXIPIME) IV 2 g (03/26/23 2158)   dextrose 5 % and 0.9 % NaCl Stopped (03/27/23 0918)   magnesium sulfate bolus IVPB     potassium chloride     vancomycin 1,000 mg (03/26/23 2141)    PRN Meds: acetaminophen **OR** acetaminophen, guaiFENesin, hydrALAZINE, HYDROmorphone (DILAUDID) injection, ipratropium-albuterol, metoprolol tartrate, ondansetron (ZOFRAN) IV, oxyCODONE, senna-docusate, traZODone  Physical Exam Vitals reviewed.  HENT:     Head: Normocephalic and atraumatic.  Pulmonary:     Effort: Pulmonary effort is normal.  Skin:    General: Skin is warm and dry.  Neurological:     Mental Status: She is alert.  Psychiatric:        Behavior: Behavior normal.             Vital Signs: BP (!) 140/83 (BP Location: Right Arm)   Pulse 97   Temp 98.4 F (36.9 C) (Oral)    Resp 17   Ht 5\' 2"  (1.575 m)   Wt 60.6 kg   SpO2 97%   BMI 24.44 kg/m  SpO2: SpO2: 97 % O2 Device: O2 Device: Nasal Cannula O2 Flow Rate: O2 Flow Rate (L/min): 2 L/min       Palliative Assessment/Data: 30%      Patient Active Problem List   Diagnosis Date  Noted   Pressure injury of skin 03/24/2023   Osteomyelitis of foot, right, acute (HCC) 03/23/2023   Aspiration pneumonia (HCC) 02/24/2023   Hypophosphatemia 02/24/2023   Complicated UTI (urinary tract infection) 02/23/2023   Acute metabolic encephalopathy 02/22/2023   History of CVA (cerebrovascular accident) 02/22/2023   Tracheal mass 02/22/2023   Goals of care, counseling/discussion 11/11/2022   Malignant neoplasm of upper lobe of left lung (HCC) 10/15/2022   Neurological symptoms 09/22/2022   Hypoxemia 09/22/2022   Acute respiratory failure with hypoxia (HCC)    Diabetic oculopathy (HCC) 11/14/2020   Hemiplegia of nondominant side as late effect of cerebrovascular disease (HCC) 11/14/2020   Hyperglycemia due to type 2 diabetes mellitus (HCC) 11/14/2020   Long term (current) use of insulin (HCC) 11/14/2020   Memory loss 11/14/2020   Nicotine dependence, unspecified, uncomplicated 11/14/2020   Non-pressure chronic ulcer of other part of left foot with unspecified severity (HCC) 11/14/2020   Residual cognitive deficit as late effect of cerebrovascular accident 11/14/2020   Pressure injury of skin of both feet 09/16/2020   Acute blood loss anemia    Gastric AVM    Acute upper GI bleed 09/14/2020   Symptomatic anemia 09/14/2020   PVD (peripheral vascular disease) (HCC) 09/14/2020   Cellulitis of great toe of right foot 08/26/2020   CVA (cerebral vascular accident) (HCC)    COVID-19 virus infection 07/2020   Ankle ulcer (HCC) 06/28/2020   Closed fracture of neck of left femur (HCC) 08/11/2019   COVID-19 virus detected 08/11/2019   Dementia without behavioral disturbance (HCC) 08/11/2019   AKI (acute kidney injury)  (HCC) 01/15/2019   Cerebral thrombosis with cerebral infarction 01/15/2019   TIA (transient ischemic attack) 01/13/2019   Insulin dependent type 2 diabetes mellitus (HCC) 01/13/2019   Hypokalemia 12/18/2018   Acute CVA (cerebrovascular accident) (HCC) 12/17/2018   Malnutrition of moderate degree 05/31/2018   Hematochezia 05/29/2018   Ischemic colitis (HCC) 05/29/2018   Acute encephalopathy 05/29/2018   Type 2 diabetes mellitus (HCC) 05/29/2018   Hypertension 05/29/2018   Depression 05/29/2018   COPD (chronic obstructive pulmonary disease) (HCC) 05/29/2018   Sepsis (HCC) 05/28/2018   Diabetes (HCC) 03/16/2018   ILD (interstitial lung disease) (HCC) 02/18/2018   Chronic respiratory failure with hypoxia (HCC) 02/18/2018   COPD (chronic obstructive pulmonary disease) (HCC) 01/12/2018   Tobacco abuse 01/12/2018   Arthritis 07/29/2017   Asthma 07/29/2017   Gastroesophageal reflux disease 07/29/2017   Hyperlipidemia 07/29/2017   Left hemiplegia (HCC) 07/29/2017   Sciatica 07/29/2017   Vitamin D deficiency 07/29/2017    Palliative Care Assessment & Plan   Patient Profile: 77 y.o. female  with past medical history of dementia, stage IV NSCLC on palliative radiation, CVA with left-sided hemiparesis and contracture, PAD, ILD with hypoxia 2 L nasal cannula, COPD, gastric AVM, DM2  admitted on 03/23/2023 with right diabetic foot ulcer sent by PCP due to concerns of osteomyelitis.    Upon admission MRI confirmed osteomyelitis of her foot, orthopedic was consulted who recommended also consulting vascular due to concerns of significant PAD. Plan is for right AKA.  Assessment: Patient was more awake than during our visit yesterday. The RN was feeding her breakfast. Called patient's daughter [name] to check in. She is glad to hear the patient is alert and eating. She has no needs at this time. Encouraged her to reach out to PMT with any questions or concerns. We will continue to follow the  patient.  Recommendations/Plan: Full code/ Full scope PMT will  continue to support     Code Status:    Code Status Orders  (From admission, onward)           Start     Ordered   03/23/23 2209  Full code  Continuous       Question:  By:  Answer:  Consent: discussion documented in EHR   03/23/23 2209          Care plan was discussed with bedside RN  Total time: 25 minutes  Thank you for allowing the Palliative Medicine Team to assist in the care of this patient.    Sherryll Burger, NP  Please contact Palliative Medicine Team phone at [phone] for questions and concerns.

## 2023-03-27 NOTE — Progress Notes (Signed)
PROGRESS NOTE    Crystal Moore  OZH:[id number] DOB: 02/15/1946 DOA: 03/23/2023 PCP: Lorenda Ishihara, MD     Brief Narrative:  77 year old with history of dementia, stage IV NSCLC on palliative radiation, CVA with left-sided hemiparesis and contracture, PAD, ILD with hypoxia 2 L nasal cannula, COPD, gastric AVM, DM2 with right diabetic foot ulcer sent to the hospital by PCP due to concerns of osteomyelitis.  Upon admission MRI confirmed osteomyelitis of her foot, orthopedic was consulted who recommended also consulting vascular due to concerns of significant PAD.  Patient was transferred to [facility] for vascular surgery evaluation.  After meeting with palliative care, patient's family and patient confirmed that they want to proceed with right-sided AKA.     Assessment & Plan:  Principal Problem:   Osteomyelitis of foot, right, acute (HCC) Active Problems:   COPD (chronic obstructive pulmonary disease) (HCC)   Chronic respiratory failure with hypoxia (HCC)   Depression   COPD (chronic obstructive pulmonary disease) (HCC)   CVA (cerebral vascular accident) (HCC)   Dementia without behavioral disturbance (HCC)   History of CVA (cerebrovascular accident)   Pressure injury of skin    Osteomyelitis of foot, right, acute:  This was noted on the x-ray at the PCPs office.  Cultures remain negative.  Inflammatory markers are mildly elevated.  MRI is consistent with osteomyelitis of the first metatarsal head, medial/lateral hallux sesamoid and base of the first proximal phalanx.  Orthopedic, vascular and palliative care consulted.  Patient and family would like to proceed with right sided AKA   Acute toxic encephalopathy: resolved.  Received Ativan and Dilaudid prior to MRI.  Avoid sedating meds.  CT of the head is negative.  Supportive care. We may have to change her cefepime   Chronic seizure PD/ILD/chronic hypoxic RF:  Continue 2 L nasal cannula.   IDDM-2 with  hyperglycemia and diabetic wound with osteomyelitis: sliding scale and Accu-Chek. Adjust insulin as needed.    History of CVA with residual left-sided weakness and spasticity -Continue home meds except Plavix -PT/OT   Essential hypertension:  Norvasc, IV as needed   Hyperlipidemia -Continue home Lipitor   Dementia without behavioral disturbance: Seems advanced.  Awake and alert but only oriented to self. -Reorientation and delirium precaution   Normocytic anemia: H&H stable. Closely monitor hemoglobin   Goal of care counseling: Seen by palliative care service.  Family wishes her to be full code     DVT prophylaxis: enoxaparin (LOVENOX) Code Status: Full code Family Communication: Daughter at bedside Status is: Inpatient Remains inpatient appropriate because: Ongoing management for her right foot osteomyelitis w/ tentative plans for AKA               Subjective: Feeling breakfast with help of nursing tech.  No other complaints.   Examination:  General exam: Appears calm and comfortable, elderly frail.  Contracted extremities. Respiratory system: Clear to auscultation. Respiratory effort normal. Cardiovascular system: S1 & S2 heard, RRR. No JVD, murmurs, rubs, gallops or clicks. No pedal edema. Gastrointestinal system: Abdomen is nondistended, soft and nontender. No organomegaly or masses felt. Normal bowel sounds heard. Central nervous system: Alert and oriented. No focal neurological deficits. Extremities: Symmetric 4 x 5 power. Skin: Right foot dressing in place Psychiatry: Judgement and insight appear normal. Mood & affect appropriate.   Pressure Injury 03/23/23 Foot Right;Medial Unstageable - Full thickness tissue loss in which the base of the injury is covered by slough (yellow, tan, gray, green or brown) and/or eschar (tan,  brown or black) in the wound bed. (Active)  03/23/23 2315  Location: Foot  Location Orientation: Right;Medial  Staging: Unstageable -  Full thickness tissue loss in which the base of the injury is covered by slough (yellow, tan, gray, green or brown) and/or eschar (tan, brown or black) in the wound bed.  Wound Description (Comments):   Present on Admission: Yes     Pressure Injury 03/24/23 Hip Anterior;Left;Proximal Stage 3 -  Full thickness tissue loss. Subcutaneous fat may be visible but bone, tendon or muscle are NOT exposed. (Active)  03/24/23   Location: Hip  Location Orientation: Anterior;Left;Proximal  Staging: Stage 3 -  Full thickness tissue loss. Subcutaneous fat may be visible but bone, tendon or muscle are NOT exposed.  Wound Description (Comments):   Present on Admission: Yes     Diet Orders (From admission, onward)     Start     Ordered   03/26/23 1108  Diet regular Room service appropriate? No; Fluid consistency: Thin  Diet effective now       Question Answer Comment  Room service appropriate? No   Fluid consistency: Thin      03/26/23 1107            Objective: Vitals:   03/26/23 2144 03/27/23 0505 03/27/23 0506 03/27/23 0859  BP: (!) 123/46  (!) 151/59 (!) 140/83  Pulse: 71  86 97  Resp: 16   17  Temp: 97.7 F (36.5 C) 97.7 F (36.5 C) 97.7 F (36.5 C) 98.4 F (36.9 C)  TempSrc: Axillary Oral Oral Oral  SpO2: 100%  96% 97%  Weight:      Height:        Intake/Output Summary (Last 24 hours) at 03/27/2023 1116 Last data filed at 03/27/2023 0930 Gross per 24 hour  Intake 240 ml  Output --  Net 240 ml   Filed Weights   03/23/23 1641  Weight: 60.6 kg    Scheduled Meds:  amLODipine  5 mg Oral Daily   ascorbic acid  500 mg Oral Daily   atorvastatin  80 mg Oral QHS   enoxaparin (LOVENOX) injection  40 mg Subcutaneous Q24H   gabapentin  300 mg Oral TID   insulin aspart  0-5 Units Subcutaneous QHS   insulin aspart  0-9 Units Subcutaneous TID WC   insulin glargine-yfgn  5 Units Subcutaneous q AM   leptospermum manuka honey  1 Application Topical Daily   pantoprazole  40 mg Oral  BID   zinc sulfate  220 mg Oral Daily   Continuous Infusions:  ceFEPime (MAXIPIME) IV 2 g (03/26/23 2158)   dextrose 5 % and 0.9 % NaCl Stopped (03/27/23 0918)   magnesium sulfate bolus IVPB     potassium chloride     vancomycin 1,000 mg (03/26/23 2141)    Nutritional status Signs/Symptoms: estimated needs Interventions: Magic cup, [employer] Instant Breakfast, Refer to RD note for recommendations Body mass index is 24.44 kg/m.  Data Reviewed:   CBC: Recent Labs  Lab 03/23/23 2021 03/24/23 0357 03/26/23 0418 03/27/23 0242  WBC 6.9 7.7 7.6 8.4  NEUTROABS 5.1 5.7  --   --   HGB 9.3* 8.1* 8.3* 8.4*  HCT 30.4* 26.4* 26.9* 27.4*  MCV 79.2* 80.0 77.3* 78.1*  PLT 278 280 295 297   Basic Metabolic Panel: Recent Labs  Lab 03/23/23 2021 03/24/23 0357 03/26/23 0418 03/27/23 0242  NA 138 139 137 137  K 4.0 3.6 3.9 3.5  CL 104 103 107 105  CO2 27 26 23 24   GLUCOSE 213* 167* 224* 170*  BUN 27* 19 11 7*  CREATININE 0.85 0.64 1.06* 0.62  CALCIUM 8.9 9.2 8.9 9.1  MG  --  1.9 1.7 1.6*  PHOS  --   --  2.3* 2.3*   GFR: Estimated Creatinine Clearance: 51.3 mL/min (by C-G formula based on SCr of 0.62 mg/dL). Liver Function Tests: Recent Labs  Lab 03/23/23 2021 03/26/23 0418  AST 14*  --   ALT 15  --   ALKPHOS 82  --   BILITOT 0.2*  --   PROT 7.4  --   ALBUMIN 2.6* 2.3*   No results for input(s): "LIPASE", "AMYLASE" in the last 168 hours. Recent Labs  Lab 03/25/23 1524  AMMONIA 31   Coagulation Profile: No results for input(s): "INR", "PROTIME" in the last 168 hours. Cardiac Enzymes: No results for input(s): "CKTOTAL", "CKMB", "CKMBINDEX", "TROPONINI" in the last 168 hours. BNP (last 3 results) No results for input(s): "PROBNP" in the last 8760 hours. HbA1C: No results for input(s): "HGBA1C" in the last 72 hours. CBG: Recent Labs  Lab 03/26/23 0744 03/26/23 1127 03/26/23 1621 03/26/23 2016 03/27/23 0627  GLUCAP 213* 151* 168* 143* 195*   Lipid  Profile: No results for input(s): "CHOL", "HDL", "LDLCALC", "TRIG", "CHOLHDL", "LDLDIRECT" in the last 72 hours. Thyroid Function Tests: Recent Labs    03/25/23 1524  TSH 1.305   Anemia Panel: Recent Labs    03/25/23 1254  VITAMINB12 601   Sepsis Labs: Recent Labs  Lab 03/23/23 2253  LATICACIDVEN 1.0    Recent Results (from the past 240 hour(s))  Blood Cultures x 2 sites     Status: None (Preliminary result)   Collection Time: 03/23/23  8:21 PM   Specimen: BLOOD  Result Value Ref Range Status   Specimen Description   Final    BLOOD SITE NOT SPECIFIED Performed at [facility], [address] [place], [place] [address]    Special Requests   Final    BOTTLES DRAWN AEROBIC AND ANAEROBIC Blood Culture adequate volume Performed at [facility], [address], [place], [place] [address]    Culture   Final    NO GROWTH 3 DAYS Performed at [facility], [address] [place], [place] [address]    Report Status PENDING  Incomplete  Blood Cultures x 2 sites     Status: None (Preliminary result)   Collection Time: 03/24/23 12:13 AM   Specimen: BLOOD RIGHT ARM  Result Value Ref Range Status   Specimen Description   Final    BLOOD RIGHT ARM Performed at [facility], [address], [place], [place] [address]    Special Requests   Final    BOTTLES DRAWN AEROBIC ONLY Blood Culture results may not be optimal due to an inadequate volume of blood received in culture bottles Performed at [facility], [address], [place], [place] [address]    Culture   Final    NO GROWTH 3 DAYS Performed at [facility], [address] [place], [place] [address]    Report Status PENDING  Incomplete         Radiology Studies: VAS [place] ABI WITH/WO TBI  Result Date: 03/26/2023  LOWER EXTREMITY DOPPLER STUDY Patient Name:  CRUSITA PILLE  Date of Exam:   03/25/2023 Medical Rec #: [id number]        Accession #:     [id number]  Date of Birth: 02-16-1946         Patient Gender: F Patient Age:   31 years Exam Location:  [facility] Procedure:      VAS [place] ABI WITH/WO TBI Referring Phys: TAYE GONFA --------------------------------------------------------------------------------  Indications: Peripheral artery disease. High Risk Factors: Diabetes.  Limitations: Today's exam was limited due to bandages , an open wound, patient              positioning and patient immobility, left arm contracture. Comparison Study: 03/10/2022 - Right: Resting right ankle-brachial index                   indicates severe right lower                   extremity arterial disease.                    Left: Resting left ankle-brachial index indicates moderate                   left lower                   extremity arterial disease. Performing Technologist: Olen Cordial RVT  Examination Guidelines: A complete evaluation includes at minimum, Doppler waveform signals and systolic blood pressure reading at the level of bilateral brachial, anterior tibial, and posterior tibial arteries, when vessel segments are accessible. Bilateral testing is considered an integral part of a complete examination. Photoelectric Plethysmograph (PPG) waveforms and toe systolic pressure readings are included as required and additional duplex testing as needed. Limited examinations for reoccurring indications may be performed as noted.  ABI Findings: +---------+------------------+-----+----------+------------------+ Right    Rt Pressure (mmHg)IndexWaveform  Comment            +---------+------------------+-----+----------+------------------+ Brachial 140                    triphasic                    +---------+------------------+-----+----------+------------------+ PTA                                       Open wound/bandage +---------+------------------+-----+----------+------------------+ DP       56                0.40 monophasic                    +---------+------------------+-----+----------+------------------+ Great Toe                                 Open wound/bandage +---------+------------------+-----+----------+------------------+ +---------+------------------+-----+----------+-----------+ Left     Lt Pressure (mmHg)IndexWaveform  Comment     +---------+------------------+-----+----------+-----------+ Brachial                                  Contracture +---------+------------------+-----+----------+-----------+ PTA      70                0.50 monophasic            +---------+------------------+-----+----------+-----------+ DP       63                0.45 monophasic            +---------+------------------+-----+----------+-----------+  Great Toe33                0.24                       +---------+------------------+-----+----------+-----------+ +-------+-----------+-----------+------------+------------+ ABI/TBIToday's ABIToday's TBIPrevious ABIPrevious TBI +-------+-----------+-----------+------------+------------+ Right  0.4                                            +-------+-----------+-----------+------------+------------+ Left   0.5        0.24                                +-------+-----------+-----------+------------+------------+  Summary: Right: Resting right ankle-brachial index indicates severe right lower extremity arterial disease. Unable to obtain TBI due to great toe wound. Left: Resting left ankle-brachial index indicates moderate left lower extremity arterial disease. The left toe-brachial index is abnormal. *See table(s) above for measurements and observations.  Electronically signed by Gerarda Fraction on 03/26/2023 at 9:21:15 AM.    Final    CT HEAD WO CONTRAST ( )  Result Date: 03/25/2023 CLINICAL DATA:  Altered mental status EXAM: CT HEAD WITHOUT CONTRAST TECHNIQUE: Contiguous axial images were obtained from the base of the skull through the vertex without intravenous  contrast. RADIATION DOSE REDUCTION: This exam was performed according to the departmental dose-optimization program which includes automated exposure control, adjustment of the mA and/or kV according to patient size and/or use of iterative reconstruction technique. COMPARISON:  None Available. FINDINGS: Brain: There is no mass, hemorrhage or extra-axial collection. There is generalized atrophy without lobar predilection. Hypodensity of the white matter is most commonly associated with chronic microvascular disease. Old small vessel infarct of the right cerebellum. Old right frontal infarct. Vascular: Atherosclerotic calcification of the vertebral and internal carotid arteries at the skull base. No abnormal hyperdensity of the major intracranial arteries or dural venous sinuses. Skull: The visualized skull base, calvarium and extracranial soft tissues are normal. Sinuses/Orbits: No fluid levels or advanced mucosal thickening of the visualized paranasal sinuses. No mastoid or middle ear effusion. The orbits are normal. IMPRESSION: 1. No acute intracranial abnormality. 2. Advanced atrophy and chronic microvascular ischemia. 3. Old right frontal and right cerebellar infarcts. Electronically Signed   By: Deatra Robinson M.D.   On: 03/25/2023 22:48           LOS: 4 days   Time spent= 35 mins    Miguel Rota, MD [facility]  If 7PM-7AM, please contact night-coverage  03/27/2023, 11:16 AM

## 2023-03-27 NOTE — Progress Notes (Signed)
Received pt this am with infiltrated PIV. Fluids stopped, catheter removed, ice applied. MD notified. Patient is awaiting IV team for pending medications.

## 2023-03-28 DIAGNOSIS — M86171 Other acute osteomyelitis, right ankle and foot: Secondary | ICD-10-CM | POA: Diagnosis not present

## 2023-03-28 LAB — BASIC METABOLIC PANEL
Anion gap: 11 (ref 5–15)
BUN: 7 mg/dL — ABNORMAL LOW (ref 8–23)
CO2: 24 mmol/L (ref 22–32)
Calcium: 8.6 mg/dL — ABNORMAL LOW (ref 8.9–10.3)
Chloride: 104 mmol/L (ref 98–111)
Creatinine, Ser: 0.63 mg/dL (ref 0.44–1.00)
GFR, Estimated: >60 mL/min (ref >60)
Glucose, Bld: 203 mg/dL — ABNORMAL HIGH (ref 70–99)
Potassium: 3.6 mmol/L (ref 3.5–5.1)
Sodium: 139 mmol/L (ref 135–145)

## 2023-03-28 LAB — GLUCOSE, CAPILLARY
Glucose-Capillary: 198 mg/dL — ABNORMAL HIGH (ref 70–99)
Glucose-Capillary: 184 mg/dL — ABNORMAL HIGH (ref 70–99)
Glucose-Capillary: 121 mg/dL — ABNORMAL HIGH (ref 70–99)
Glucose-Capillary: 110 mg/dL — ABNORMAL HIGH (ref 70–99)

## 2023-03-28 LAB — CBC
HCT: 27.3 % — ABNORMAL LOW (ref 36.0–46.0)
Hemoglobin: 8.6 g/dL — ABNORMAL LOW (ref 12.0–15.0)
MCH: 24.4 pg — ABNORMAL LOW (ref 26.0–34.0)
MCHC: 31.5 g/dL (ref 30.0–36.0)
MCV: 77.3 fL — ABNORMAL LOW (ref 80.0–100.0)
Platelets: 209 10*3/uL (ref 150–400)
RBC: 3.53 MIL/uL — ABNORMAL LOW (ref 3.87–5.11)
RDW: 17.1 % — ABNORMAL HIGH (ref 11.5–15.5)
WBC: 7.6 10*3/uL (ref 4.0–10.5)
nRBC: 0 % (ref 0.0–0.2)

## 2023-03-28 LAB — MAGNESIUM: Magnesium: 2 mg/dL (ref 1.7–2.4)

## 2023-03-28 LAB — VANCOMYCIN, RANDOM: Vancomycin Rm: 16 ug/mL

## 2023-03-28 LAB — VANCOMYCIN, TROUGH: Vancomycin Tr: 10 ug/mL — ABNORMAL LOW (ref 15–20)

## 2023-03-28 NOTE — Plan of Care (Signed)
  Problem: Education: Goal: Ability to describe self-care measures that may prevent or decrease complications (Diabetes Survival Skills Education) will improve Outcome: Progressing Goal: Individualized Educational Video(s) Outcome: Progressing   Problem: Coping: Goal: Ability to adjust to condition or change in health will improve Outcome: Progressing   Problem: Fluid Volume: Goal: Ability to maintain a balanced intake and output will improve Outcome: Progressing   Problem: Health Behavior/Discharge Planning: Goal: Ability to identify and utilize available resources and services will improve Outcome: Progressing Goal: Ability to manage health-related needs will improve Outcome: Progressing   Problem: Metabolic: Goal: Ability to maintain appropriate glucose levels will improve Outcome: Progressing   Problem: Nutritional: Goal: Maintenance of adequate nutrition will improve Outcome: Progressing Goal: Progress toward achieving an optimal weight will improve Outcome: Progressing   Problem: Skin Integrity: Goal: Risk for impaired skin integrity will decrease Outcome: Progressing   Problem: Tissue Perfusion: Goal: Adequacy of tissue perfusion will improve Outcome: Progressing   Problem: Education: Goal: Knowledge of General Education information will improve Description: Including pain rating scale, medication(s)/side effects and non-pharmacologic comfort measures Outcome: Progressing   Problem: Health Behavior/Discharge Planning: Goal: Ability to manage health-related needs will improve Outcome: Progressing   Problem: Clinical Measurements: Goal: Ability to maintain clinical measurements within normal limits will improve Outcome: Progressing Goal: Will remain free from infection Outcome: Progressing Goal: Diagnostic test results will improve Outcome: Progressing Goal: Respiratory complications will improve Outcome: Progressing Goal: Cardiovascular complication will  be avoided Outcome: Progressing   Problem: Nutrition: Goal: Adequate nutrition will be maintained Outcome: Progressing   Problem: Coping: Goal: Level of anxiety will decrease Outcome: Progressing   Problem: Elimination: Goal: Will not experience complications related to bowel motility Outcome: Progressing   Problem: Pain Managment: Goal: General experience of comfort will improve Outcome: Progressing   Problem: Safety: Goal: Ability to remain free from injury will improve Outcome: Progressing   Problem: Skin Integrity: Goal: Risk for impaired skin integrity will decrease Outcome: Progressing

## 2023-03-28 NOTE — Progress Notes (Signed)
PROGRESS NOTE    Crystal Moore  GNF:[id number] DOB: [birth year] DOA: 03/23/2023 PCP: Crystal Ishihara, MD     Brief Narrative:  77 year old with history of dementia, stage IV NSCLC on palliative radiation, CVA with left-sided hemiparesis and contracture, PAD, ILD with hypoxia 2 L nasal cannula, COPD, gastric AVM, DM2 with right diabetic foot ulcer sent to the hospital by PCP due to concerns of osteomyelitis.  Upon admission MRI confirmed osteomyelitis of her foot, orthopedic was consulted who recommended also consulting vascular due to concerns of significant PAD.  Patient was transferred to [facility] for vascular surgery evaluation.  After meeting with palliative care, patient's family and patient confirmed that they want to proceed with right-sided AKA.     Assessment & Plan:  Principal Problem:   Osteomyelitis of foot, right, acute (HCC) Active Problems:   COPD (chronic obstructive pulmonary disease) (HCC)   Chronic respiratory failure with hypoxia (HCC)   Depression   COPD (chronic obstructive pulmonary disease) (HCC)   CVA (cerebral vascular accident) (HCC)   Dementia without behavioral disturbance (HCC)   History of CVA (cerebrovascular accident)   Pressure injury of skin    Osteomyelitis of foot, right, acute:  This was noted on the x-ray at the PCPs office.  Cultures remain negative.  Inflammatory markers are mildly elevated.  MRI is consistent with osteomyelitis of the first metatarsal head, medial/lateral hallux sesamoid and base of the first proximal phalanx.  Orthopedic, vascular and palliative care consulted.  Patient and family would like to proceed with right sided AKA, tentative plan for Tuesday   Acute toxic encephalopathy: resolved.  Received Ativan and Dilaudid prior to MRI.  Avoid sedating meds.  CT of the head is negative.  Supportive care. We may have to change her cefepime   Chronic seizure PD/ILD/chronic hypoxic RF:  Continue 2 L nasal  cannula.   IDDM-2 with hyperglycemia and diabetic wound with osteomyelitis: sliding scale and Accu-Chek. Adjust insulin as needed.    History of CVA with residual left-sided weakness and spasticity -Continue home meds except Plavix -PT/OT   Essential hypertension:  Norvasc, IV as needed   Hyperlipidemia -Continue home Lipitor   Dementia without behavioral disturbance: Seems advanced.  Awake and alert but only oriented to self. -Reorientation and delirium precaution   Normocytic anemia: H&H stable. Closely monitor hemoglobin   Goal of care counseling: Seen by palliative care service.  Family wishes her to be full code     DVT prophylaxis: enoxaparin (LOVENOX) Code Status: Full code Family Communication: Daughter at bedside Status is: Inpatient Remains inpatient appropriate because: Ongoing management for her right foot osteomyelitis w/ tentative plans for AKA        Subjective: Ate well yesterday.  No complaints this morning   Examination:  General exam: Appears calm and comfortable, cachectic frail.  Some contractions. Respiratory system: Clear to auscultation. Respiratory effort normal. Cardiovascular system: S1 & S2 heard, RRR. No JVD, murmurs, rubs, gallops or clicks. No pedal edema. Gastrointestinal system: Abdomen is nondistended, soft and nontender. No organomegaly or masses felt. Normal bowel sounds heard. Central nervous system: Alert and oriented. No focal neurological deficits. Extremities: Symmetric 4 x 5 power. Skin: No rashes, lesions or ulcers Psychiatry: Judgement and insight appear normal. Mood & affect appropriate.   Pressure Injury 03/23/23 Foot Right;Medial Unstageable - Full thickness tissue loss in which the base of the injury is covered by slough (yellow, tan, gray, green or brown) and/or eschar (tan, brown or black) in the wound  bed. (Active)  03/23/23 2315  Location: Foot  Location Orientation: Right;Medial  Staging: Unstageable - Full  thickness tissue loss in which the base of the injury is covered by slough (yellow, tan, gray, green or brown) and/or eschar (tan, brown or black) in the wound bed.  Wound Description (Comments):   Present on Admission: Yes     Pressure Injury 03/24/23 Hip Anterior;Left;Proximal Stage 3 -  Full thickness tissue loss. Subcutaneous fat may be visible but bone, tendon or muscle are NOT exposed. (Active)  03/24/23   Location: Hip  Location Orientation: Anterior;Left;Proximal  Staging: Stage 3 -  Full thickness tissue loss. Subcutaneous fat may be visible but bone, tendon or muscle are NOT exposed.  Wound Description (Comments):   Present on Admission: Yes     Diet Orders (From admission, onward)     Start     Ordered   03/26/23 1108  Diet regular Room service appropriate? No; Fluid consistency: Thin  Diet effective now       Question Answer Comment  Room service appropriate? No   Fluid consistency: Thin      03/26/23 1107            Objective: Vitals:   03/27/23 1755 03/27/23 2007 03/28/23 0358 03/28/23 0737  BP: 136/70 (!) 137/49 (!) 129/44 (!) 141/59  Pulse: (!) 102 89 79 (!) 50  Resp: 15 15 16 18   Temp: 99.1 F (37.3 C) 98.4 F (36.9 C) 98 F (36.7 C) 98.6 F (37 C)  TempSrc: Oral Axillary Oral   SpO2: 97% 91% 92%   Weight:      Height:        Intake/Output Summary (Last 24 hours) at 03/28/2023 0927 Last data filed at 03/28/2023 0316 Gross per 24 hour  Intake 3638.42 ml  Output 1500 ml  Net 2138.42 ml   Filed Weights   03/23/23 1641  Weight: 60.6 kg    Scheduled Meds:  amLODipine  5 mg Oral Daily   ascorbic acid  500 mg Oral Daily   atorvastatin  80 mg Oral QHS   enoxaparin (LOVENOX) injection  40 mg Subcutaneous Q24H   gabapentin  300 mg Oral TID   insulin aspart  0-5 Units Subcutaneous QHS   insulin aspart  0-9 Units Subcutaneous TID WC   insulin glargine-yfgn  5 Units Subcutaneous q AM   leptospermum manuka honey  1 Application Topical Daily    pantoprazole  40 mg Oral BID   zinc sulfate  220 mg Oral Daily   Continuous Infusions:  ceFEPime (MAXIPIME) IV Stopped (03/28/23 0121)   vancomycin Stopped (03/27/23 2333)    Nutritional status Signs/Symptoms: estimated needs Interventions: Magic cup, [employer] Instant Breakfast, Refer to RD note for recommendations Body mass index is 24.44 kg/m.  Data Reviewed:   CBC: Recent Labs  Lab 03/23/23 2021 03/24/23 0357 03/26/23 0418 03/27/23 0242 03/28/23 0625  WBC 6.9 7.7 7.6 8.4 7.6  NEUTROABS 5.1 5.7  --   --   --   HGB 9.3* 8.1* 8.3* 8.4* 8.6*  HCT 30.4* 26.4* 26.9* 27.4* 27.3*  MCV 79.2* 80.0 77.3* 78.1* 77.3*  PLT 278 280 295 297 209   Basic Metabolic Panel: Recent Labs  Lab 03/23/23 2021 03/24/23 0357 03/26/23 0418 03/27/23 0242 03/28/23 0625  NA 138 139 137 137 139  K 4.0 3.6 3.9 3.5 3.6  CL 104 103 107 105 104  CO2 27 26 23 24 24   GLUCOSE 213* 167* 224* 170* 203*  BUN 27*  19 11 7* 7*  CREATININE 0.85 0.64 1.06* 0.62 0.63  CALCIUM 8.9 9.2 8.9 9.1 8.6*  MG  --  1.9 1.7 1.6* 2.0  PHOS  --   --  2.3* 2.3*  --    GFR: Estimated Creatinine Clearance: 51.3 mL/min (by C-G formula based on SCr of 0.63 mg/dL). Liver Function Tests: Recent Labs  Lab 03/23/23 2021 03/26/23 0418  AST 14*  --   ALT 15  --   ALKPHOS 82  --   BILITOT 0.2*  --   PROT 7.4  --   ALBUMIN 2.6* 2.3*   No results for input(s): "LIPASE", "AMYLASE" in the last 168 hours. Recent Labs  Lab 03/25/23 1524  AMMONIA 31   Coagulation Profile: No results for input(s): "INR", "PROTIME" in the last 168 hours. Cardiac Enzymes: No results for input(s): "CKTOTAL", "CKMB", "CKMBINDEX", "TROPONINI" in the last 168 hours. BNP (last 3 results) No results for input(s): "PROBNP" in the last 8760 hours. HbA1C: No results for input(s): "HGBA1C" in the last 72 hours. CBG: Recent Labs  Lab 03/27/23 0627 03/27/23 1300 03/27/23 1655 03/27/23 2119 03/28/23 0743  GLUCAP 195* 182* 126* 158* 198*    Lipid Profile: No results for input(s): "CHOL", "HDL", "LDLCALC", "TRIG", "CHOLHDL", "LDLDIRECT" in the last 72 hours. Thyroid Function Tests: Recent Labs    03/25/23 1524  TSH 1.305   Anemia Panel: Recent Labs    03/25/23 1254  VITAMINB12 601   Sepsis Labs: Recent Labs  Lab 03/23/23 2253  LATICACIDVEN 1.0    Recent Results (from the past 240 hour(s))  Blood Cultures x 2 sites     Status: None (Preliminary result)   Collection Time: 03/23/23  8:21 PM   Specimen: BLOOD  Result Value Ref Range Status   Specimen Description   Final    BLOOD SITE NOT SPECIFIED Performed at [facility], [address] [place], [place] [address]    Special Requests   Final    BOTTLES DRAWN AEROBIC AND ANAEROBIC Blood Culture adequate volume Performed at [facility], [address] [place], [place] [address]    Culture   Final    NO GROWTH 4 DAYS Performed at [facility], [address] [place], [place] [address]    Report Status PENDING  Incomplete  Blood Cultures x 2 sites     Status: None (Preliminary result)   Collection Time: 03/24/23 12:13 AM   Specimen: BLOOD RIGHT ARM  Result Value Ref Range Status   Specimen Description   Final    BLOOD RIGHT ARM Performed at [facility], [address], [place], [place] [address]    Special Requests   Final    BOTTLES DRAWN AEROBIC ONLY Blood Culture results may not be optimal due to an inadequate volume of blood received in culture bottles Performed at [facility], [address], [place], [place] [address]    Culture   Final    NO GROWTH 4 DAYS Performed at [facility], [address] [place], [place] [address]    Report Status PENDING  Incomplete         Radiology Studies: No results found.         LOS: 5 days   Time spent= 35 mins    Miguel Rota, MD [facility]  If 7PM-7AM, please contact night-coverage  03/28/2023,  9:27 AM

## 2023-03-28 NOTE — Plan of Care (Signed)
  Problem: Coping: Goal: Ability to adjust to condition or change in health will improve Outcome: Progressing   Problem: Fluid Volume: Goal: Ability to maintain a balanced intake and output will improve Outcome: Progressing   Problem: Clinical Measurements: Goal: Will remain free from infection Outcome: Progressing

## 2023-03-28 NOTE — Progress Notes (Signed)
  Progress Note    03/28/2023 12:42 PM * No surgery date entered *  Subjective: No current complaints, family at bedside  Vitals:   03/28/23 0358 03/28/23 0737  BP: (!) 129/44 (!) 141/59  Pulse: 79 (!) 50  Resp: 16 18  Temp: 98 F (36.7 C) 98.6 F (37 C)  SpO2: 92%     Physical Exam: She is alert and awake but nonverbal Dressing right foot  CBC    Component Value Date/Time   WBC 7.6 03/28/2023 0625   RBC 3.53 (L) 03/28/2023 0625   HGB 8.6 (L) 03/28/2023 0625   HGB 11.1 (L) 11/11/2022 0809   HCT 27.3 (L) 03/28/2023 0625   PLT 209 03/28/2023 0625   PLT 296 11/11/2022 0809   MCV 77.3 (L) 03/28/2023 0625   MCH 24.4 (L) 03/28/2023 0625   MCHC 31.5 03/28/2023 0625   RDW 17.1 (H) 03/28/2023 0625   LYMPHSABS 1.1 03/24/2023 0357   MONOABS 0.5 03/24/2023 0357   EOSABS 0.4 03/24/2023 0357   BASOSABS 0.0 03/24/2023 0357    BMET    Component Value Date/Time   NA 139 03/28/2023 0625   K 3.6 03/28/2023 0625   CL 104 03/28/2023 0625   CO2 24 03/28/2023 0625   GLUCOSE 203 (H) 03/28/2023 0625   BUN 7 (L) 03/28/2023 0625   CREATININE 0.63 03/28/2023 0625   CREATININE 1.03 (H) 11/11/2022 0809   CALCIUM 8.6 (L) 03/28/2023 0625   GFRNONAA >60 03/28/2023 0625   GFRNONAA 56 (L) 11/11/2022 0809   GFRAA >60 01/16/2019 0714    INR    Component Value Date/Time   INR 1.1 09/22/2022 1750     Intake/Output Summary (Last 24 hours) at 03/28/2023 1242 Last data filed at 03/28/2023 0934 Gross per 24 hour  Intake 3758.42 ml  Output 1500 ml  Net 2258.42 ml     Assessment/plan:  77 y.o. female is here with ulceration of right foot status post previous treatment patient.  Plans for right above-knee amputation with Dr. Sherral Hammers on September 3.  She will be n.p.o. past midnight tomorrow night.  All questions were answered and family updated at bedside.    Remo Kirschenmann C. Randie Heinz, MD [facility] Office: [phone] Pager: [phone]  03/28/2023 12:42  PM

## 2023-03-28 NOTE — Progress Notes (Signed)
Patient is a lab draw. RN notified and will notify lab.

## 2023-03-29 DIAGNOSIS — Z7189 Other specified counseling: Secondary | ICD-10-CM

## 2023-03-29 DIAGNOSIS — Z515 Encounter for palliative care: Secondary | ICD-10-CM

## 2023-03-29 DIAGNOSIS — M86171 Other acute osteomyelitis, right ankle and foot: Secondary | ICD-10-CM | POA: Diagnosis not present

## 2023-03-29 LAB — CULTURE, BLOOD (ROUTINE X 2)
Culture: NO GROWTH
Culture: NO GROWTH
Special Requests: ADEQUATE

## 2023-03-29 LAB — CBC
HCT: 27.5 % — ABNORMAL LOW (ref 36.0–46.0)
Hemoglobin: 8.6 g/dL — ABNORMAL LOW (ref 12.0–15.0)
MCH: 24.6 pg — ABNORMAL LOW (ref 26.0–34.0)
MCHC: 31.3 g/dL (ref 30.0–36.0)
MCV: 78.6 fL — ABNORMAL LOW (ref 80.0–100.0)
Platelets: 319 10*3/uL (ref 150–400)
RBC: 3.5 MIL/uL — ABNORMAL LOW (ref 3.87–5.11)
RDW: 17.2 % — ABNORMAL HIGH (ref 11.5–15.5)
WBC: 8.9 10*3/uL (ref 4.0–10.5)
nRBC: 0 % (ref 0.0–0.2)

## 2023-03-29 LAB — BASIC METABOLIC PANEL
Anion gap: 7 (ref 5–15)
BUN: 6 mg/dL — ABNORMAL LOW (ref 8–23)
CO2: 24 mmol/L (ref 22–32)
Calcium: 8.8 mg/dL — ABNORMAL LOW (ref 8.9–10.3)
Chloride: 104 mmol/L (ref 98–111)
Creatinine, Ser: 0.67 mg/dL (ref 0.44–1.00)
GFR, Estimated: >60 mL/min (ref >60)
Glucose, Bld: 153 mg/dL — ABNORMAL HIGH (ref 70–99)
Potassium: 3.3 mmol/L — ABNORMAL LOW (ref 3.5–5.1)
Sodium: 135 mmol/L (ref 135–145)

## 2023-03-29 LAB — GLUCOSE, CAPILLARY
Glucose-Capillary: 146 mg/dL — ABNORMAL HIGH (ref 70–99)
Glucose-Capillary: 159 mg/dL — ABNORMAL HIGH (ref 70–99)
Glucose-Capillary: 140 mg/dL — ABNORMAL HIGH (ref 70–99)
Glucose-Capillary: 182 mg/dL — ABNORMAL HIGH (ref 70–99)
Glucose-Capillary: 218 mg/dL — ABNORMAL HIGH (ref 70–99)

## 2023-03-29 LAB — MAGNESIUM: Magnesium: 1.9 mg/dL (ref 1.7–2.4)

## 2023-03-29 MED ORDER — POTASSIUM CHLORIDE 20 MEQ PO PACK
40.0000 meq | PACK | Freq: Once | ORAL | Status: AC
  Administered 2023-03-29: 40 meq via ORAL
  Filled 2023-03-29: qty 2

## 2023-03-29 MED ORDER — VANCOMYCIN HCL 750 MG/150ML IV SOLN
750.0000 mg | Freq: Two times a day (BID) | INTRAVENOUS | Status: DC
  Administered 2023-03-29 – 2023-03-30 (×4): 750 mg via INTRAVENOUS
  Filled 2023-03-29 (×4): qty 150

## 2023-03-29 NOTE — Progress Notes (Signed)
Pharmacy Antibiotic Note- Follow Up  Crystal Moore is a 77 y.o. female admitted on 03/23/2023 with R toe  osteomyelitis .  Pharmacy has been consulted for Cefepime + Vancomycin dosing. MRI consistent with osteomyelitis of first metatarsal head, medial/lateral hallux sesamoid and base of the first proximal phalanx and proceeding with R. AKA on 03/30/2023. Bcx NGTD, afebrile, WBC WNL, sCr ~0.6  9/2 AM: Vancomycin random 16 and vancomycin trough 10 on 1000 mg every 24 hours. Adjusting dose to be Vancomycin 750 mg IV every 12 hours (AUC 503, Cmin 18).  Plan: Continue Cefepime 2gm IV q12h Change Vancomycin to 750mg  IV q12h (eAUC ~503)    > Goal AUC 400-550 Monitor daily CBC, temp, SCr Plan for R AKA on 9/3 with Dr. Karin Lieu F/u duration of antibiotic therapy post-op  Height: 5\' 2"  (157.5 cm) Weight: 60.6 kg (133 lb 9.6 oz) IBW/kg (Calculated) : 50.1  Temp (24hrs), Avg:98.7 F (37.1 C), Min:98 F (36.7 C), Max:99.8 F (37.7 C)  Recent Labs  Lab 03/23/23 2021 03/23/23 2253 03/24/23 0357 03/26/23 0418 03/27/23 0242 03/28/23 0625 03/28/23 2157  WBC 6.9  --  7.7 7.6 8.4 7.6  --   CREATININE 0.85  --  0.64 1.06* 0.62 0.63  --   LATICACIDVEN  --  1.0  --   --   --   --   --   VANCOTROUGH  --   --   --   --   --   --  10*  VANCORANDOM  --   --   --   --   --  16  --     Estimated Creatinine Clearance: 51.3 mL/min (by C-G formula based on SCr of 0.63 mg/dL).    Allergies  Allergen Reactions   Aspirin Other (See Comments)    "Cannot take due to previous GI bleed," per daughter      Antimicrobials this admission: 8/27 Vancomycin >>  8/27 Cefepime >>   Dose adjustments this admission: N/A  Microbiology results: 8/27 BCx: NGTD x3d  Thank you for allowing pharmacy to be a part of this patient's care.  Arabella Merles, PharmD. Clinical Pharmacist 03/29/2023 12:38 AM

## 2023-03-29 NOTE — TOC Progression Note (Signed)
Transition of Care [facility]) - Progression Note    Patient Details  Name: Crystal Moore MRN: [id number] Date of Birth: June 07, 1946  Transition of Care [facility]) CM/SW Contact  Ronny Bacon, RN Phone Number: 03/29/2023, 1:41 PM  Clinical Narrative:   Patient sleeping when attempted to visit. Per provider notes, patient to have surgery on 9/3 for AKA.    Expected Discharge Plan:  (TBD) Barriers to Discharge: Continued Medical Work up  Expected Discharge Plan and Services In-house Referral: Clinical Social Work     Living arrangements for the past 2 months: Single Family Home                                       Social Determinants of Health (SDOH) Interventions SDOH Screenings   Food Insecurity: Patient Unable To Answer (03/23/2023)  Housing: High Risk (03/23/2023)  Transportation Needs: Patient Unable To Answer (03/23/2023)  Utilities: Patient Unable To Answer (03/23/2023)  Tobacco Use: Medium Risk (03/23/2023)    Readmission Risk Interventions    03/25/2023    1:00 PM 09/18/2020   12:44 PM  Readmission Risk Prevention Plan  Transportation Screening Complete Complete  PCP or Specialist Appt within 3-5 Days  Complete  HRI or Home Care Consult Complete   Social Work Consult for Recovery Care Planning/Counseling Complete Complete  Palliative Care Screening Complete Not Applicable  Medication Review [employer]) Complete Complete

## 2023-03-29 NOTE — Progress Notes (Signed)
   Palliative Medicine Inpatient Follow Up Note HPI: 77 y.o. female  with past medical history of dementia, stage IV NSCLC on palliative radiation, CVA with left-sided hemiparesis and contracture, PAD, ILD with hypoxia 2 L nasal cannula, COPD, gastric AVM, DM2  admitted on 03/23/2023 with right diabetic foot ulcer sent by PCP due to concerns of osteomyelitis.    Upon admission MRI confirmed osteomyelitis of her foot, orthopedic was consulted who recommended also consulting vascular due to concerns of significant PAD. Plan is for right AKA on 9/3.  Today's Discussion 03/29/2023  *Please note that this is a verbal dictation therefore any spelling or grammatical errors are due to the "[employer]agon [employer] One" system interpretation.  Chart reviewed inclusive of vital signs, progress notes, laboratory results, and diagnostic images. Two doses of IV dilaudid in the last 24 hours.   Per patients RN, Harvie Heck patient exemplified some moaning overnight requiring a dose of dilaudid though she is otherwise resting comfortably.   I met with Alayshia at bedside this morning. She is aware of who she is and where she is though is foggy on her situation. I was able to reorient [place] as to her reasoning for being here. We discussed her foot ulcer and anticipated amputation.   I called patients daughter, [name] and provided a brief update. She shares the plan to come and spend the night with Kyan. She is aware of the plan for amputation tomorrow.  Created space and opportunity for patients daughter to explore thoughts feelings and fears regarding her mothers current medical situation.  Questions and concerns addressed/Palliative Support Provided.   Objective Assessment: Vital Signs Vitals:   03/29/23 0423 03/29/23 0743  BP: (!) 146/59 (!) 150/68  Pulse: 95   Resp: 19 17  Temp: 98.4 F (36.9 C) (!) 100.5 F (38.1 C)  SpO2: 93% 95%    Intake/Output Summary (Last 24 hours) at 03/29/2023 0945 Last data filed at  03/29/2023 1610 Gross per 24 hour  Intake 400 ml  Output --  Net 400 ml   Last Weight  Most recent update: 03/23/2023  4:41 PM    Weight  60.6 kg (133 lb 9.6 oz)            Gen:  Elderly AA F in NAD HEENT: moist mucous membranes CV: Regular rate and rhythm  PULM:  On 2LPM [place], breathing is even and nonlabored ABD: soft/nontender  EXT: L foot dressing in place Neuro: Alert and oriented x2  SUMMARY OF RECOMMENDATIONS   Full Code / Full scope of care  Plan for amputation tomorrow  Emotional support provided to patient and her daughter  Ongoing PMT support  Billing based on MDM: High - Parenteral  ______________________________________________________________________________________ Lamarr Lulas [place] Palliative Medicine Team Team Cell Phone: [phone] Please utilize secure chat with additional questions, if there is no response within 30 minutes please call the above phone number  Palliative Medicine Team providers are available by phone from 7am to 7pm daily and can be reached through the team cell phone.  Should this patient require assistance outside of these hours, please call the patient's attending physician.

## 2023-03-29 NOTE — Progress Notes (Signed)
PROGRESS NOTE    Crystal Moore  ZOX:[id number] DOB: 31-Mar-1946 DOA: 03/23/2023 PCP: Lorenda Ishihara, MD     Brief Narrative:  77 year old with history of dementia, stage IV NSCLC on palliative radiation, CVA with left-sided hemiparesis and contracture, PAD, ILD with hypoxia 2 L nasal cannula, COPD, gastric AVM, DM2 with right diabetic foot ulcer sent to the hospital by PCP due to concerns of osteomyelitis.  Upon admission MRI confirmed osteomyelitis of her foot, orthopedic was consulted who recommended also consulting vascular due to concerns of significant PAD.  Patient was transferred to [facility] for vascular surgery evaluation.  After meeting with palliative care, patient's family and patient confirmed that they want to proceed with right-sided AKA.     Assessment & Plan:  Principal Problem:   Osteomyelitis of foot, right, acute (HCC) Active Problems:   COPD (chronic obstructive pulmonary disease) (HCC)   Chronic respiratory failure with hypoxia (HCC)   Depression   COPD (chronic obstructive pulmonary disease) (HCC)   CVA (cerebral vascular accident) (HCC)   Dementia without behavioral disturbance (HCC)   History of CVA (cerebrovascular accident)   Pressure injury of skin    Osteomyelitis of foot, right, acute:  This was noted on the x-ray at the PCPs office.  Cultures remain negative.  Inflammatory markers are mildly elevated.  MRI is consistent with osteomyelitis of the first metatarsal head, medial/lateral hallux sesamoid and base of the first proximal phalanx.  Orthopedic, vascular and palliative care consulted.  Patient and family would like to proceed with right sided AKA, tentative plan for Tuesday Cont Current Abx   Acute toxic encephalopathy: resolved.  Received Ativan and Dilaudid prior to MRI.  Avoid sedating meds.  CT of the head is negative.  Supportive care. We may have to change her cefepime   Chronic seizure PD/ILD/chronic hypoxic RF:  Continue  2 L nasal cannula.   IDDM-2 with hyperglycemia and diabetic wound with osteomyelitis: sliding scale and Accu-Chek. Adjust insulin as needed.    History of CVA with residual left-sided weakness and spasticity -Continue home meds except Plavix -PT/OT   Essential hypertension:  Norvasc, IV as needed   Hyperlipidemia -Continue home Lipitor   Dementia without behavioral disturbance: Seems advanced.  Awake and alert but only oriented to self. -Reorientation and delirium precaution   Normocytic anemia: H&H stable. Closely monitor hemoglobin   Goal of care counseling: Seen by palliative care service.  Family wishes her to be full code     DVT prophylaxis: enoxaparin (LOVENOX) Code Status: Full code Family Communication: Daughter at bedside Status is: Inpatient Remains inpatient appropriate because: Ongoing management for her right foot osteomyelitis w/ tentative plans for AKA               Subjective:  Laying in the bed no complaints  Examination:  General exam: Appears calm and comfortable, cachectic frail Respiratory system: Clear to auscultation. Respiratory effort normal. Cardiovascular system: S1 & S2 heard, RRR. No JVD, murmurs, rubs, gallops or clicks. No pedal edema. Gastrointestinal system: Abdomen is nondistended, soft and nontender. No organomegaly or masses felt. Normal bowel sounds heard. Central nervous system: Alert and oriented in place which is her baseline Extremities: Good track at extremities Skin: Left foot dressing in place Psychiatry: Judgement and insight appear for   Pressure Injury 03/23/23 Foot Right;Medial Unstageable - Full thickness tissue loss in which the base of the injury is covered by slough (yellow, tan, gray, green or brown) and/or eschar (tan, brown or black) in  the wound bed. (Active)  03/23/23 2315  Location: Foot  Location Orientation: Right;Medial  Staging: Unstageable - Full thickness tissue loss in which the base of the  injury is covered by slough (yellow, tan, gray, green or brown) and/or eschar (tan, brown or black) in the wound bed.  Wound Description (Comments):   Present on Admission: Yes     Pressure Injury 03/24/23 Hip Anterior;Left;Proximal Stage 3 -  Full thickness tissue loss. Subcutaneous fat may be visible but bone, tendon or muscle are NOT exposed. (Active)  03/24/23   Location: Hip  Location Orientation: Anterior;Left;Proximal  Staging: Stage 3 -  Full thickness tissue loss. Subcutaneous fat may be visible but bone, tendon or muscle are NOT exposed.  Wound Description (Comments):   Present on Admission: Yes     Diet Orders (From admission, onward)     Start     Ordered   03/30/23 0001  Diet NPO time specified  Diet effective midnight        03/29/23 0802   03/26/23 1108  Diet regular Room service appropriate? No; Fluid consistency: Thin  Diet effective now       Question Answer Comment  Room service appropriate? No   Fluid consistency: Thin      03/26/23 1107            Objective: Vitals:   03/28/23 1244 03/28/23 2003 03/29/23 0423 03/29/23 0743  BP: (!) 142/70 129/62 (!) 146/59 (!) 150/68  Pulse: (!) 104 92 95   Resp: 18 17 19 17   Temp: 99.8 F (37.7 C) 98.2 F (36.8 C) 98.4 F (36.9 C) (!) 100.5 F (38.1 C)  TempSrc:   Oral Oral  SpO2: 93% 92% 93% 95%  Weight:      Height:        Intake/Output Summary (Last 24 hours) at 03/29/2023 1105 Last data filed at 03/29/2023 0900 Gross per 24 hour  Intake 520 ml  Output --  Net 520 ml   Filed Weights   03/23/23 1641  Weight: 60.6 kg    Scheduled Meds:  amLODipine  5 mg Oral Daily   ascorbic acid  500 mg Oral Daily   atorvastatin  80 mg Oral QHS   enoxaparin (LOVENOX) injection  40 mg Subcutaneous Q24H   gabapentin  300 mg Oral TID   insulin aspart  0-5 Units Subcutaneous QHS   insulin aspart  0-9 Units Subcutaneous TID WC   insulin glargine-yfgn  5 Units Subcutaneous q AM   leptospermum manuka honey  1  Application Topical Daily   pantoprazole  40 mg Oral BID   zinc sulfate  220 mg Oral Daily   Continuous Infusions:  ceFEPime (MAXIPIME) IV 2 g (03/29/23 0958)   vancomycin 750 mg (03/29/23 0853)    Nutritional status Signs/Symptoms: estimated needs Interventions: Magic cup, [employer] Instant Breakfast, Refer to RD note for recommendations Body mass index is 24.44 kg/m.  Data Reviewed:   CBC: Recent Labs  Lab 03/23/23 2021 03/24/23 0357 03/26/23 0418 03/27/23 0242 03/28/23 0625 03/29/23 0326  WBC 6.9 7.7 7.6 8.4 7.6 8.9  NEUTROABS 5.1 5.7  --   --   --   --   HGB 9.3* 8.1* 8.3* 8.4* 8.6* 8.6*  HCT 30.4* 26.4* 26.9* 27.4* 27.3* 27.5*  MCV 79.2* 80.0 77.3* 78.1* 77.3* 78.6*  PLT 278 280 295 297 209 319   Basic Metabolic Panel: Recent Labs  Lab 03/24/23 0357 03/26/23 0418 03/27/23 0242 03/28/23 0625 03/29/23 0326  NA 139  137 137 139 135  K 3.6 3.9 3.5 3.6 3.3*  CL 103 107 105 104 104  CO2 26 23 24 24 24   GLUCOSE 167* 224* 170* 203* 153*  BUN 19 11 7* 7* 6*  CREATININE 0.64 1.06* 0.62 0.63 0.67  CALCIUM 9.2 8.9 9.1 8.6* 8.8*  MG 1.9 1.7 1.6* 2.0 1.9  PHOS  --  2.3* 2.3*  --   --    GFR: Estimated Creatinine Clearance: 51.3 mL/min (by C-G formula based on SCr of 0.67 mg/dL). Liver Function Tests: Recent Labs  Lab 03/23/23 2021 03/26/23 0418  AST 14*  --   ALT 15  --   ALKPHOS 82  --   BILITOT 0.2*  --   PROT 7.4  --   ALBUMIN 2.6* 2.3*   No results for input(s): "LIPASE", "AMYLASE" in the last 168 hours. Recent Labs  Lab 03/25/23 1524  AMMONIA 31   Coagulation Profile: No results for input(s): "INR", "PROTIME" in the last 168 hours. Cardiac Enzymes: No results for input(s): "CKTOTAL", "CKMB", "CKMBINDEX", "TROPONINI" in the last 168 hours. BNP (last 3 results) No results for input(s): "PROBNP" in the last 8760 hours. HbA1C: No results for input(s): "HGBA1C" in the last 72 hours. CBG: Recent Labs  Lab 03/28/23 1129 03/28/23 1624  03/28/23 2003 03/29/23 0408 03/29/23 0741  GLUCAP 184* 121* 110* 146* 159*   Lipid Profile: No results for input(s): "CHOL", "HDL", "LDLCALC", "TRIG", "CHOLHDL", "LDLDIRECT" in the last 72 hours. Thyroid Function Tests: No results for input(s): "TSH", "T4TOTAL", "FREET4", "T3FREE", "THYROIDAB" in the last 72 hours. Anemia Panel: No results for input(s): "VITAMINB12", "FOLATE", "FERRITIN", "TIBC", "IRON", "RETICCTPCT" in the last 72 hours. Sepsis Labs: Recent Labs  Lab 03/23/23 2253  LATICACIDVEN 1.0    Recent Results (from the past 240 hour(s))  Blood Cultures x 2 sites     Status: None   Collection Time: 03/23/23  8:21 PM   Specimen: BLOOD  Result Value Ref Range Status   Specimen Description   Final    BLOOD SITE NOT SPECIFIED Performed at [facility], [address] [place], [place] [address]    Special Requests   Final    BOTTLES DRAWN AEROBIC AND ANAEROBIC Blood Culture adequate volume Performed at [facility], [address], [place], [place] [address]    Culture   Final    NO GROWTH 5 DAYS Performed at [facility], [address] [place], [place] [address]    Report Status 03/29/2023 FINAL  Final  Blood Cultures x 2 sites     Status: None   Collection Time: 03/24/23 12:13 AM   Specimen: BLOOD RIGHT ARM  Result Value Ref Range Status   Specimen Description   Final    BLOOD RIGHT ARM Performed at [facility], [address] [place], [place] [address]    Special Requests   Final    BOTTLES DRAWN AEROBIC ONLY Blood Culture results may not be optimal due to an inadequate volume of blood received in culture bottles Performed at [facility], [address], [place], [place] [address]    Culture   Final    NO GROWTH 5 DAYS Performed at [facility], [address], [place], [place] [address]    Report Status 03/29/2023 FINAL  Final         Radiology Studies: No results  found.         LOS: 6 days  Time spent= 35 mins    Miguel Rota, MD [facility]  If 7PM-7AM, please contact night-coverage  03/29/2023, 11:05 AM

## 2023-03-30 ENCOUNTER — Other Ambulatory Visit: Payer: Self-pay

## 2023-03-30 ENCOUNTER — Inpatient Hospital Stay (HOSPITAL_COMMUNITY): Payer: 59 | Admitting: Certified Registered"

## 2023-03-30 ENCOUNTER — Encounter (HOSPITAL_COMMUNITY): Payer: Self-pay | Admitting: Family Medicine

## 2023-03-30 ENCOUNTER — Encounter (HOSPITAL_COMMUNITY)
Admission: EM | Disposition: A | Discharge status: Home Health | Payer: Self-pay | Source: Home / Self Care | Attending: Internal Medicine

## 2023-03-30 DIAGNOSIS — E785 Hyperlipidemia, unspecified: Secondary | ICD-10-CM

## 2023-03-30 DIAGNOSIS — Z515 Encounter for palliative care: Secondary | ICD-10-CM | POA: Diagnosis not present

## 2023-03-30 DIAGNOSIS — I10 Essential (primary) hypertension: Secondary | ICD-10-CM | POA: Diagnosis not present

## 2023-03-30 DIAGNOSIS — M86171 Other acute osteomyelitis, right ankle and foot: Secondary | ICD-10-CM | POA: Diagnosis not present

## 2023-03-30 DIAGNOSIS — Z87891 Personal history of nicotine dependence: Secondary | ICD-10-CM | POA: Diagnosis not present

## 2023-03-30 DIAGNOSIS — I70291 Other atherosclerosis of native arteries of extremities, right leg: Secondary | ICD-10-CM

## 2023-03-30 DIAGNOSIS — Z7189 Other specified counseling: Secondary | ICD-10-CM | POA: Diagnosis not present

## 2023-03-30 HISTORY — PX: AMPUTATION: SHX166

## 2023-03-30 LAB — BASIC METABOLIC PANEL
Anion gap: 10 (ref 5–15)
BUN: 12 mg/dL (ref 8–23)
CO2: 22 mmol/L (ref 22–32)
Calcium: 9.2 mg/dL (ref 8.9–10.3)
Chloride: 102 mmol/L (ref 98–111)
Creatinine, Ser: 0.79 mg/dL (ref 0.44–1.00)
GFR, Estimated: >60 mL/min (ref >60)
Glucose, Bld: 252 mg/dL — ABNORMAL HIGH (ref 70–99)
Potassium: 4 mmol/L (ref 3.5–5.1)
Sodium: 134 mmol/L — ABNORMAL LOW (ref 135–145)

## 2023-03-30 LAB — CBC
HCT: 25.9 % — ABNORMAL LOW (ref 36.0–46.0)
Hemoglobin: 8 g/dL — ABNORMAL LOW (ref 12.0–15.0)
MCH: 23.7 pg — ABNORMAL LOW (ref 26.0–34.0)
MCHC: 30.9 g/dL (ref 30.0–36.0)
MCV: 76.6 fL — ABNORMAL LOW (ref 80.0–100.0)
Platelets: 292 10*3/uL (ref 150–400)
RBC: 3.38 MIL/uL — ABNORMAL LOW (ref 3.87–5.11)
RDW: 17 % — ABNORMAL HIGH (ref 11.5–15.5)
WBC: 10.2 10*3/uL (ref 4.0–10.5)
nRBC: 0 % (ref 0.0–0.2)

## 2023-03-30 LAB — GLUCOSE, CAPILLARY
Glucose-Capillary: 195 mg/dL — ABNORMAL HIGH (ref 70–99)
Glucose-Capillary: 162 mg/dL — ABNORMAL HIGH (ref 70–99)
Glucose-Capillary: 252 mg/dL — ABNORMAL HIGH (ref 70–99)
Glucose-Capillary: 119 mg/dL — ABNORMAL HIGH (ref 70–99)

## 2023-03-30 LAB — MAGNESIUM: Magnesium: 1.7 mg/dL (ref 1.7–2.4)

## 2023-03-30 SURGERY — AMPUTATION, ABOVE KNEE
Anesthesia: General | Site: Knee | Laterality: Right

## 2023-03-30 MED ORDER — SUGAMMADEX SODIUM 200 MG/2ML IV SOLN
INTRAVENOUS | Status: DC | PRN
  Administered 2023-03-30: 200 mg via INTRAVENOUS

## 2023-03-30 MED ORDER — ORAL CARE MOUTH RINSE
15.0000 mL | Freq: Once | OROMUCOSAL | Status: AC
  Administered 2023-03-30: 15 mL via OROMUCOSAL

## 2023-03-30 MED ORDER — PHENYLEPHRINE 80 MCG/ML (10ML) SYRINGE FOR IV PUSH (FOR BLOOD PRESSURE SUPPORT)
PREFILLED_SYRINGE | INTRAVENOUS | Status: DC | PRN
  Administered 2023-03-30: 80 ug via INTRAVENOUS
  Administered 2023-03-30 (×3): 160 ug via INTRAVENOUS

## 2023-03-30 MED ORDER — 0.9 % SODIUM CHLORIDE (POUR BTL) OPTIME
TOPICAL | Status: DC | PRN
  Administered 2023-03-30: 1000 mL

## 2023-03-30 MED ORDER — CHLORHEXIDINE GLUCONATE 0.12 % MT SOLN
15.0000 mL | Freq: Once | OROMUCOSAL | Status: AC
  Filled 2023-03-30: qty 15

## 2023-03-30 MED ORDER — LACTATED RINGERS IV SOLN: INTRAVENOUS | Status: DC

## 2023-03-30 MED ORDER — PROPOFOL 10 MG/ML IV BOLUS
INTRAVENOUS | Status: DC | PRN
  Administered 2023-03-30: 150 mg via INTRAVENOUS

## 2023-03-30 MED ORDER — LIDOCAINE 2% (20 MG/ML) 5 ML SYRINGE
INTRAMUSCULAR | Status: DC | PRN
  Administered 2023-03-30: 60 mg via INTRAVENOUS

## 2023-03-30 MED ORDER — ORAL CARE MOUTH RINSE: 15.0000 mL | Freq: Once | OROMUCOSAL | Status: AC

## 2023-03-30 MED ORDER — DOCUSATE SODIUM 100 MG PO CAPS
100.0000 mg | ORAL_CAPSULE | Freq: Every day | ORAL | Status: DC
  Administered 2023-03-31 – 2023-04-02 (×3): 100 mg via ORAL
  Filled 2023-03-30 (×3): qty 1

## 2023-03-30 MED ORDER — PROPOFOL 10 MG/ML IV BOLUS
INTRAVENOUS | Status: AC
  Filled 2023-03-30: qty 20

## 2023-03-30 MED ORDER — LIDOCAINE 2% (20 MG/ML) 5 ML SYRINGE
INTRAMUSCULAR | Status: AC
  Filled 2023-03-30: qty 5

## 2023-03-30 MED ORDER — CHLORHEXIDINE GLUCONATE 0.12 % MT SOLN
15.0000 mL | Freq: Once | OROMUCOSAL | Status: AC
  Administered 2023-03-30: 15 mL via OROMUCOSAL

## 2023-03-30 MED ORDER — ORAL CARE MOUTH RINSE: 15.0000 mL | OROMUCOSAL | Status: DC | PRN

## 2023-03-30 MED ORDER — ONDANSETRON HCL 4 MG/2ML IJ SOLN
INTRAMUSCULAR | Status: DC | PRN
Start: 2023-03-30 — End: 2023-03-30
  Administered 2023-03-30: 4 mg via INTRAVENOUS

## 2023-03-30 MED ORDER — PHENOL 1.4 % MT LIQD: 1.0000 | OROMUCOSAL | Status: DC | PRN

## 2023-03-30 MED ORDER — ROCURONIUM BROMIDE 100 MG/10ML IV SOLN
INTRAVENOUS | Status: DC | PRN
Start: 2023-03-30 — End: 2023-03-30
  Administered 2023-03-30: 40 mg via INTRAVENOUS
  Administered 2023-03-30: 20 mg via INTRAVENOUS

## 2023-03-30 MED ORDER — PHENYLEPHRINE HCL-NACL 20-0.9 MG/250ML-% IV SOLN
INTRAVENOUS | Status: DC | PRN
  Administered 2023-03-30: 50 ug/min via INTRAVENOUS

## 2023-03-30 SURGICAL SUPPLY — 60 items
BAG COUNTER SPONGE SURGICOUNT (BAG) ×1 IMPLANT
BAG SPNG CNTER NS LX DISP (BAG) ×1
BANDAGE ESMARK 6X9 LF (GAUZE/BANDAGES/DRESSINGS) ×1 IMPLANT
BLADE SAW SGTL 73X25 THK (BLADE) ×1 IMPLANT
BNDG CMPR 5X6 CHSV STRCH STRL (GAUZE/BANDAGES/DRESSINGS) ×1
BNDG CMPR 9X6 STRL LF SNTH (GAUZE/BANDAGES/DRESSINGS) ×1
BNDG COHESIVE 6X5 TAN ST LF (GAUZE/BANDAGES/DRESSINGS) ×1 IMPLANT
BNDG ELASTIC 4X5.8 VLCR STR LF (GAUZE/BANDAGES/DRESSINGS) ×1 IMPLANT
BNDG ELASTIC 6X5.8 VLCR STR LF (GAUZE/BANDAGES/DRESSINGS) ×1 IMPLANT
BNDG ESMARK 6X9 LF (GAUZE/BANDAGES/DRESSINGS) ×1 IMPLANT
BNDG GAUZE DERMACEA FLUFF 4 (GAUZE/BANDAGES/DRESSINGS) ×2 IMPLANT
BNDG GZE DERMACEA 4 6PLY (GAUZE/BANDAGES/DRESSINGS) ×1
BUR DISC 0.8X25 (BURR) IMPLANT
CANISTER SUCT 3000ML PPV (MISCELLANEOUS) ×1 IMPLANT
CLIP TI LARGE 6 (CLIP) IMPLANT
CLIP TI MEDIUM 24 (CLIP) ×1 IMPLANT
CLIP TI MEDIUM 6 (CLIP) ×1 IMPLANT
CLIP TI WIDE RED SMALL 24 (CLIP) ×1 IMPLANT
COVER BACK TABLE 60X90IN (DRAPES) ×1 IMPLANT
COVER SURGICAL LIGHT HANDLE (MISCELLANEOUS) ×2 IMPLANT
DRAIN CHANNEL 19F RND (DRAIN) IMPLANT
DRAIN RELI 100 BL SUC LF ST (DRAIN)
DRAPE HALF SHEET 40X57 (DRAPES) ×1 IMPLANT
DRAPE INCISE 23X17 STRL (DRAPES) ×1 IMPLANT
DRAPE INCISE IOBAN 23X17 STRL (DRAPES) ×1 IMPLANT
DRAPE INCISE IOBAN 66X45 STRL (DRAPES) ×1 IMPLANT
DRAPE ORTHO SPLIT 77X108 STRL (DRAPES) ×2
DRAPE SURG ORHT 6 SPLT 77X108 (DRAPES) ×2 IMPLANT
DRAPE U-SHAPE 47X51 STRL (DRAPES) IMPLANT
ELECT CAUTERY BLADE 6.4 (BLADE) ×1 IMPLANT
ELECT REM PT RETURN 9FT ADLT (ELECTROSURGICAL) ×1 IMPLANT
ELECTRODE REM PT RTRN 9FT ADLT (ELECTROSURGICAL) ×1 IMPLANT
EVACUATOR SILICONE 100CC (DRAIN) IMPLANT
GAUZE PAD ABD 8X10 STRL (GAUZE/BANDAGES/DRESSINGS) IMPLANT
GAUZE SPONGE 4X4 12PLY STRL (GAUZE/BANDAGES/DRESSINGS) ×2 IMPLANT
GAUZE XEROFORM 5X9 LF (GAUZE/BANDAGES/DRESSINGS) IMPLANT
GLOVE BIOGEL PI IND STRL 8 (GLOVE) ×1 IMPLANT
GOWN STRL REUS W/TWL 2XL LVL3 (GOWN DISPOSABLE) ×2 IMPLANT
KIT BASIN OR (CUSTOM PROCEDURE TRAY) ×1 IMPLANT
KIT TURNOVER KIT B (KITS) ×1 IMPLANT
NS IRRIG 1000ML POUR BTL (IV SOLUTION) ×1 IMPLANT
PACK GENERAL/GYN (CUSTOM PROCEDURE TRAY) ×1 IMPLANT
PAD ABD 8X10 STRL (GAUZE/BANDAGES/DRESSINGS) IMPLANT
PAD ARMBOARD 7.5X6 YLW CONV (MISCELLANEOUS) ×2 IMPLANT
RASP HELIOCORDIAL MED (MISCELLANEOUS) IMPLANT
STAPLER VISISTAT 35W (STAPLE) ×1 IMPLANT
STOCKINETTE IMPERVIOUS LG (DRAPES) ×1 IMPLANT
SUT ETHILON 3 0 PS 1 (SUTURE) IMPLANT
SUT PERMA SILK 0 CT1 (SUTURE) IMPLANT
SUT SILK 0 TIES 10X30 (SUTURE) ×1 IMPLANT
SUT SILK 2 0 (SUTURE) ×1
SUT SILK 2 0 SH CR/8 (SUTURE) ×1 IMPLANT
SUT SILK 2-0 18XBRD TIE 12 (SUTURE) ×1 IMPLANT
SUT VIC AB 0 CT1 27 (SUTURE) ×1
SUT VIC AB 0 CT1 27XBRD ANBCTR (SUTURE) ×1 IMPLANT
SUT VIC AB 2-0 CT1 18 (SUTURE) ×2 IMPLANT
SUT VIC AB 3-0 SH 18 (SUTURE) IMPLANT
TOWEL GREEN STERILE (TOWEL DISPOSABLE) ×2 IMPLANT
UNDERPAD 30X36 HEAVY ABSORB (UNDERPADS AND DIAPERS) ×1 IMPLANT
WATER STERILE IRR 1000ML POUR (IV SOLUTION) ×1 IMPLANT

## 2023-03-30 NOTE — Progress Notes (Signed)
  Daily Progress Note   Subjective: nonverbal   Objective: Vitals:   03/30/23 0724 03/30/23 0858  BP: (!) 142/65 (!) 147/74  Pulse: (!) 102 98  Resp: 18 17  Temp: 98.8 F (37.1 C) 98.7 F (37.1 C)  SpO2: 92% 98%    Physical Examination Comfortable Nonlabored breathing Regular rate Contracted hips and knees   ASSESSMENT/PLAN:  Pt nonverbal. Daughter POA. I urged palliative care discussions. Daughter asked for above knee amputation. After discussing the risks and benefits of right above knee amputation for critical limb ischemia with tissue loss in a nonambulatory patient, Crystal Moore (daughter) elected to proceed.     Victorino Sparrow MD MS [facility] [phone] 03/30/2023  9:33 AM

## 2023-03-30 NOTE — Anesthesia Procedure Notes (Signed)
Procedure Name: Intubation Date/Time: 03/30/2023 10:24 AM  Performed by: Gus Puma, CRNAPre-anesthesia Checklist: Patient identified, Emergency Drugs available, Suction available and Patient being monitored Patient Re-evaluated:Patient Re-evaluated prior to induction Oxygen Delivery Method: Circle System Utilized Preoxygenation: Pre-oxygenation with 100% oxygen Induction Type: IV induction Ventilation: Mask ventilation without difficulty Laryngoscope Size: Mac and 3 Grade View: Grade I Tube type: Oral Number of attempts: 1 Airway Equipment and Method: Stylet and Oral airway Placement Confirmation: ETT inserted through vocal cords under direct vision, positive ETCO2 and breath sounds checked- equal and bilateral Secured at: 21 cm Tube secured with: Tape Dental Injury: Teeth and Oropharynx as per pre-operative assessment

## 2023-03-30 NOTE — Op Note (Signed)
NAME: Crystal Moore    MRN: [id number] DOB: [birth year]    DATE OF OPERATION: 03/30/2023  PREOP DIAGNOSIS:    Right leg critical limb ischemia with tissue loss in a nonambulatory patient.   POSTOP DIAGNOSIS:    Same  PROCEDURE:    Right above knee amputation  SURGEON: Victorino Sparrow  ASSIST: Doreatha Massed PA  ANESTHESIA: General   EBL:  INDICATIONS:    Crystal Moore is a 77 y.o. female with history of COPD, dementia, previous CVA, PAD, stage IV NSCLC on palliative radiation who is nonambulatory and contracted at baseline.  She presents with pressure induced wounds to the right foot.   I had a long conversation with Lizzy's daughter regarding her options.  I was very frank that I think she would be best served with palliative care discussions due to her comorbidities.  The only surgery I would offer would be above-knee amputation.  Her daughter was adamant that she would like an above-knee amputation and that her mother would like above-knee amputation if she can verbalize her wishes.  I asked that she meet with palliative care first for discussions regarding what nonsurgical options are available.   She stated she would voice her final answer to Dr. Alanda Slim, but she was pretty sure she wanted to pursue above amputation.  I told her that if she did not with the PAD associated in the other leg, this would also have a similar outcome down the road.  She was open to bilateral above-knee amputation should the patient need this.  Again, I was very frank that I think she would be best served with comfort care measures. Family elected to pursue above knee amputation.   FINDINGS:    Healthy muscle in the right thigh  TECHNIQUE:   Patient was brought to the OR laid in the supine position.  General anesthesia was induced and the patient was prepped and draped in standard fashion.  A timeout was performed.  A fish-mouth incision line was drawn 7cm from the patella for the right  above-knee amputation. Sharp dissection and cautery were used to the femur.  Healthy bleeding was encountered. Thigh muscles were divided and femoral artery and vein identified.  These were oversewn using two, 2-0 silk pop stick ties and buttressed with a large clip.  Once the circumferential exposure of the femur was complete, a reciprocating saw was used to divide the femur. This was followed by amputation knife through the back of the thigh.  Next, a periosteal elevator and cautery were used to expose 5cm more of the proximal femur.  Rakes were used to hold the thigh muscles and the reciprocating saw was once again used to divide the femur.   The tourniquet was released and bleeding vessels oversewn using 2-0 silk suture. The sciatic nerve was identified, pulled to length and ligated with a zero silk tie. Once hemostasis was achieved, warm saline was brought to the field and the wound bed was irrigated.  I used a 0 Vicryl to close the periosteal tissue around the femur.  There was no bleeding from the marrow.  Next, my attention turned to closure of the fascial layer.  This was done using 2-0 Vicryl pop suture.  The fascial layer was closed along the entirety of the incision.  A stapler was brought to the field to oppose the dermis.  An occlusive dressing was placed and the patient was taken the PACU in stable condition.  Given the  complexity of the case a first assistant was necessary in order to expedient the procedure and safely perform the technical aspects of the operation.   Victorino Sparrow, MD [facility] DATE OF DICTATION:   03/30/2023

## 2023-03-30 NOTE — Transfer of Care (Signed)
Immediate Anesthesia Transfer of Care Note  Patient: Crystal Moore  Procedure(s) Performed: RIGHT AMPUTATION ABOVE KNEE (Right: Knee)  Patient Location: PACU  Anesthesia Type:General  Level of Consciousness: awake and drowsy  Airway & Oxygen Therapy: Patient Spontanous Breathing  Post-op Assessment: Report given to RN and Post -op Vital signs reviewed and stable  Post vital signs: Reviewed and stable  Last Vitals:  Vitals Value Taken Time  BP 105/55 03/30/23 1137  Temp    Pulse 91 03/30/23 1140  Resp 21 03/30/23 1140  SpO2 92 % 03/30/23 1140  Vitals shown include unfiled device data.  Last Pain:  Vitals:   03/30/23 0858  TempSrc: Oral  PainSc:       Patients Stated Pain Goal: 0 (03/24/23 2318)  Complications: No notable events documented.

## 2023-03-30 NOTE — Anesthesia Postprocedure Evaluation (Signed)
Anesthesia Post Note  Patient: Crystal Moore  Procedure(s) Performed: RIGHT AMPUTATION ABOVE KNEE (Right: Knee)     Patient location during evaluation: PACU Anesthesia Type: General Level of consciousness: awake and sedated Pain management: pain level controlled Vital Signs Assessment: post-procedure vital signs reviewed and stable Respiratory status: spontaneous breathing Cardiovascular status: stable Postop Assessment: no apparent nausea or vomiting Anesthetic complications: no  No notable events documented.  Last Vitals:  Vitals:   03/30/23 1215 03/30/23 1227  BP: (!) 91/55 (!) 112/59  Pulse: 92 89  Resp: 19 20  Temp: 36.6 C 36.9 C  SpO2: 94% 97%    Last Pain:  Vitals:   03/30/23 1227  TempSrc: Oral  PainSc:                  [name]

## 2023-03-30 NOTE — Plan of Care (Signed)
  Problem: Education: Goal: Ability to describe self-care measures that may prevent or decrease complications (Diabetes Survival Skills Education) will improve Outcome: Progressing Goal: Individualized Educational Video(s) Outcome: Progressing   Problem: Coping: Goal: Ability to adjust to condition or change in health will improve Outcome: Progressing   Problem: Fluid Volume: Goal: Ability to maintain a balanced intake and output will improve Outcome: Progressing   Problem: Health Behavior/Discharge Planning: Goal: Ability to identify and utilize available resources and services will improve Outcome: Progressing Goal: Ability to manage health-related needs will improve Outcome: Progressing   Problem: Metabolic: Goal: Ability to maintain appropriate glucose levels will improve Outcome: Progressing   Problem: Nutritional: Goal: Maintenance of adequate nutrition will improve Outcome: Progressing Goal: Progress toward achieving an optimal weight will improve Outcome: Progressing   Problem: Skin Integrity: Goal: Risk for impaired skin integrity will decrease Outcome: Progressing   Problem: Tissue Perfusion: Goal: Adequacy of tissue perfusion will improve Outcome: Progressing   Problem: Education: Goal: Knowledge of General Education information will improve Description: Including pain rating scale, medication(s)/side effects and non-pharmacologic comfort measures Outcome: Progressing   Problem: Health Behavior/Discharge Planning: Goal: Ability to manage health-related needs will improve Outcome: Progressing   Problem: Clinical Measurements: Goal: Ability to maintain clinical measurements within normal limits will improve Outcome: Progressing Goal: Will remain free from infection Outcome: Progressing Goal: Diagnostic test results will improve Outcome: Progressing Goal: Respiratory complications will improve Outcome: Progressing Goal: Cardiovascular complication will  be avoided Outcome: Progressing   Problem: Nutrition: Goal: Adequate nutrition will be maintained Outcome: Progressing   Problem: Coping: Goal: Level of anxiety will decrease Outcome: Progressing   Problem: Elimination: Goal: Will not experience complications related to bowel motility Outcome: Progressing   Problem: Pain Managment: Goal: General experience of comfort will improve Outcome: Progressing   Problem: Safety: Goal: Ability to remain free from injury will improve Outcome: Progressing   Problem: Skin Integrity: Goal: Risk for impaired skin integrity will decrease Outcome: Progressing   Problem: Education: Goal: Knowledge of the prescribed therapeutic regimen will improve Outcome: Progressing Goal: Ability to verbalize activity precautions or restrictions will improve Outcome: Progressing Goal: Understanding of discharge needs will improve Outcome: Progressing   Problem: Activity: Goal: Ability to perform//tolerate increased activity and mobilize with assistive devices will improve Outcome: Progressing   Problem: Clinical Measurements: Goal: Postoperative complications will be avoided or minimized Outcome: Progressing   Problem: Self-Care: Goal: Ability to meet self-care needs will improve Outcome: Progressing   Problem: Self-Concept: Goal: Ability to maintain and perform role responsibilities to the fullest extent possible will improve Outcome: Progressing   Problem: Pain Management: Goal: Pain level will decrease with appropriate interventions Outcome: Progressing   Problem: Skin Integrity: Goal: Demonstration of wound healing without infection will improve Outcome: Progressing

## 2023-03-30 NOTE — Anesthesia Preprocedure Evaluation (Signed)
Anesthesia Evaluation  Patient identified by MRN, date of birth, ID band  Reviewed: Allergy & Precautions, NPO status , Patient's Chart, lab work & pertinent test results  Airway Mallampati: II  TM Distance: >3 FB Neck ROM: Full    Dental  (+) Edentulous Upper, Edentulous Lower   Pulmonary COPD, former smoker H/o COVID-19   Pulmonary exam normal breath sounds clear to auscultation       Cardiovascular Exercise Tolerance: Good hypertension, Pt. on medications + Peripheral Vascular Disease  Normal cardiovascular exam Rhythm:Regular Rate:Normal  14-Sep-2020 17:10:01 [facility]/ED ROUTINE RECORD Sinus rhythm new Nonspecific repol abnormality, diffuse leads Confirmed by Gwyneth Sprout ([id number]) on 09/14/2020 5:12:41 PM   Neuro/Psych Seizures -,  PSYCHIATRIC DISORDERS  Depression    TIACVA (left hemiparesis), Residual Symptoms    GI/Hepatic Neg liver ROS,GERD  Medicated and Controlled,,  Endo/Other  diabetes, Type 2, Insulin Dependent    Renal/GU Renal disease  negative genitourinary   Musculoskeletal  (+) Arthritis , Osteoarthritis,    Abdominal   Peds negative pediatric ROS (+)  Hematology   Anesthesia Other Findings . Left ventricular ejection fraction, by estimation, is 65 to 70%. The  left ventricle has normal function. The left ventricle has no regional  wall motion abnormalities. Left ventricular diastolic function could not  be evaluated.   2. Right ventricular systolic function is normal. The right ventricular  size is normal.   3. The mitral valve is normal in structure. No evidence of mitral valve  regurgitation. No evidence of mitral stenosis.   4. The aortic valve is tricuspid. Aortic valve regurgitation is not  visualized.   5    Reproductive/Obstetrics                              Anesthesia Physical Anesthesia Plan  ASA: III  Anesthesia Plan: General    Post-op Pain Management:    Induction: Intravenous  PONV Risk Score and Plan: 3 and Treatment may vary due to age or medical condition and Ondansetron  Airway Management Planned: Oral ETT  Additional Equipment: None  Intra-op Plan:   Post-operative Plan:   Informed Consent: I have reviewed the patients History and Physical, chart, labs and discussed the procedure including the risks, benefits and alternatives for the proposed anesthesia with the patient or authorized representative who has indicated his/her understanding and acceptance.       Plan Discussed with: Anesthesiologist and CRNA  Anesthesia Plan Comments: (  Patient was also incidentally discovered to have new bilateral pulmonary masses worrisome for neoplasm/metastatic disease (unknown primary).  No metastatic disease to the brain on MRI.Marland Kitchen  She was previously a heavy smoker.  Outpatient pulmonology follow-up was arranged.  Patient seen by pulmonologist Dr. Tonia Brooms on 10/15/2022.  Per note, "Today in the office we talked with the patient's H POA which is her daughter proceed/H POA agreeable to proceed."  Patient will need day of surgery labs and evaluation.  Reviewed history with anesthesiologist Dr. Arby Barrette.  Advised okay to proceed as planned barring acute status change.  EKG 09/22/2022: Sinus rhythm.  Rate 74.  PAC.  Nonspecific T abnormalities, lateral leads.  NM PET super D CT 10/13/2022: IMPRESSION: 1. Stable bilateral pulmonary masses, largest is located in the left upper lobe. 2. Stable left hilar adenopathy. 3. Peribronchovascular reticular opacities and traction bronchiectasis, likely due to fibrotic interstitial lung disease. 4. Aortic Atherosclerosis (ICD10-I70.0) and Emphysema (ICD10-J43.9).  TTE 09/23/2022:  1.  Left ventricular ejection fraction, by estimation, is 65 to 70%. The  left ventricle has normal function. The left ventricle has no regional  wall motion abnormalities. Left ventricular  diastolic function could not  be evaluated.   2. Right ventricular systolic function is normal. The right ventricular  size is normal.   3. The mitral valve is normal in structure. No evidence of mitral valve  regurgitation. No evidence of mitral stenosis.   4. The aortic valve is tricuspid. Aortic valve regurgitation is not  visualized.   5. Limited echo as only parasternal images obtained due to patient's  combativeness. If more information needed, consider repeat echo when  patient able to be more comlaint with exam.     )         Anesthesia Quick Evaluation

## 2023-03-30 NOTE — Progress Notes (Signed)
   Palliative Medicine Inpatient Follow Up Note HPI: 77 y.o. female  with past medical history of dementia, stage IV NSCLC on palliative radiation, CVA with left-sided hemiparesis and contracture, PAD, ILD with hypoxia 2 L nasal cannula, COPD, gastric AVM, DM2  admitted on 03/23/2023 with right diabetic foot ulcer sent by PCP due to concerns of osteomyelitis.    Upon admission MRI confirmed osteomyelitis of her foot, orthopedic was consulted who recommended also consulting vascular due to concerns of significant PAD. Plan is for right AKA on 9/3.  Today's Discussion 03/30/2023  *Please note that this is a verbal dictation therefore any spelling or grammatical errors are due to the "[employer] One" system interpretation.  Chart reviewed inclusive of vital signs, progress notes, laboratory results, and diagnostic images. Four doses of IV dilaudid in the last 24 hours.   I met with Crystal Moore and her daughter, Crystal Moore at bedside this morning. We reviewed the plan for amputation later this morning. Lanadia is awake and moans when being spoken to.   Crystal Moore shares that there is an abundance of family support coming to see and spend time with Crystal Moore [facility] after her surgery. We reviewed that Crystal Moore has been Crystal Moore's caregiver - we discussed the importance of this role as well as her advocacy for her mother.   Discussed potential outcomes of surgical intervention. Crystal Moore shares that she has a strong mother who she believes will tolerate this procedure immensely well.   Questions and concerns addressed/Palliative Support Provided.   Objective Assessment: Vital Signs Vitals:   03/30/23 0411 03/30/23 0724  BP: (!) 151/87 (!) 142/65  Pulse: (!) 107 (!) 102  Resp: 15 18  Temp: 98.3 F (36.8 C) 98.8 F (37.1 C)  SpO2: 94% 92%    Intake/Output Summary (Last 24 hours) at 03/30/2023 1324 Last data filed at 03/30/2023 0000 Gross per 24 hour  Intake 360 ml  Output 900 ml  Net -540 ml   Last Weight   Most recent update: 03/23/2023  4:41 PM    Weight  60.6 kg (133 lb 9.6 oz)            Gen:  Elderly AA F in NAD HEENT: moist mucous membranes CV: Regular rate and rhythm  PULM:  On 2LPM Paia, breathing is even and nonlabored ABD: soft/nontender  EXT: L foot dressing in place Neuro: Moaning this morning, less verbal  SUMMARY OF RECOMMENDATIONS   Full Code / Full scope of care  Plan for amputation this morning  Emotional support provided to patient and her daughter  LE pain: - Tylenol 650mg  PO Q6H PRN - Gabapentin 300mg  PO TID - Oxycodone 5mg  PO Q4H PRN - Dilaudid 0.5mg  IV Q3H PRN  Ongoing peripheral PMT support as goals are clear  Billing based on MDM: High - Parenteral controlled substance review ______________________________________________________________________________________ Lamarr L[address] [place] Palliative Medicine Team Team Cell Phone: [phone] Please utilize secure chat with additional questions, if there is no response within 30 minutes please call the above phone number  Palliative Medicine Team providers are available by phone from 7am to 7pm daily and can be reached through the team cell phone.  Should this patient require assistance outside of these hours, please call the patient's attending physician.

## 2023-03-30 NOTE — Progress Notes (Signed)
  Progress Note    03/30/2023 7:25 AM * No surgery date entered *  Right diabetic foot ulcer with no options for revascularization Scheduled for right above knee amputation today with Dr. Karin Lieu Family at bedside.asking about anesthesia and nerve block. Recommend they direct questions to anesthesia. No questions regarding amputation surgery today  Keep NPO Consent order placed  [facility] [phone] 03/30/2023 7:25 AM

## 2023-03-30 NOTE — Progress Notes (Signed)
PROGRESS NOTE    Crystal Moore  NGE:[id number] DOB: [birth year] DOA: 03/23/2023 PCP: Lorenda Ishihara, MD     Brief Narrative:  77 year old with history of dementia, stage IV NSCLC on palliative radiation, CVA with left-sided hemiparesis and contracture, PAD, ILD with hypoxia 2 L nasal cannula, COPD, gastric AVM, DM2 with right diabetic foot ulcer sent to the hospital by PCP due to concerns of osteomyelitis.  Upon admission MRI confirmed osteomyelitis of her foot, orthopedic was consulted who recommended also consulting vascular due to concerns of significant PAD.  Patient was transferred to [facility] for vascular surgery evaluation.  After meeting with palliative care, patient's family and patient confirmed that they want to proceed with right-sided AKA.     Assessment & Plan:  Principal Problem:   Osteomyelitis of foot, right, acute (HCC) Active Problems:   COPD (chronic obstructive pulmonary disease) (HCC)   Chronic respiratory failure with hypoxia (HCC)   Depression   COPD (chronic obstructive pulmonary disease) (HCC)   CVA (cerebral vascular accident) (HCC)   Dementia without behavioral disturbance (HCC)   History of CVA (cerebrovascular accident)   Pressure injury of skin    Osteomyelitis of foot, right, acute:  This was noted on the x-ray at the PCPs office.  Cultures remain negative.  Inflammatory markers are mildly elevated.  MRI is consistent with osteomyelitis of the first metatarsal head, medial/lateral hallux sesamoid and base of the first proximal phalanx.  Orthopedic, vascular and palliative care consulted.  Right AKA today.  Cont Current Abx since 8/27, but depending on post Op course we can dc it.    Acute toxic encephalopathy: resolved.  Received Ativan and Dilaudid prior to MRI.  Avoid sedating meds.  CT of the head is negative.  Supportive care.   Chronic seizure PD/ILD/chronic hypoxic RF:  Continue 2 L nasal cannula.   IDDM-2 with hyperglycemia  and diabetic wound with osteomyelitis: sliding scale and Accu-Chek. Adjust insulin as needed.    History of CVA with residual left-sided weakness and spasticity -Continue home meds except Plavix -PT/OT   Essential hypertension:  Norvasc, IV as needed   Hyperlipidemia -Continue home Lipitor   Dementia without behavioral disturbance: Seems advanced.  Awake and alert but only oriented to self. -Reorientation and delirium precaution   Normocytic anemia: H&H stable. Closely monitor hemoglobin   Goal of care counseling: Seen by palliative care service.  Family wishes her to be full code     DVT prophylaxis: enoxaparin (LOVENOX) Code Status: Full code Family Communication: Family updated periodically.  Status is: Inpatient Remains inpatient appropriate because: OR for right AKA today.          Subjective:  No complaints feeling okay.  Does not speak much  Examination:  General exam: Appears calm and comfortable.  Cachectic frail Respiratory system: Clear to auscultation. Respiratory effort normal. Cardiovascular system: S1 & S2 heard, RRR. No JVD, murmurs, rubs, gallops or clicks. No pedal edema. Gastrointestinal system: Abdomen is nondistended, soft and nontender. No organomegaly or masses felt. Normal bowel sounds heard. Central nervous system: Alert and oriented. No focal neurological deficits. Extremities: Contracted extremities Skin: Left foot dressing in place Psychiatry: Judgement and insight appear normal. Mood & affect appropriate.   Pressure Injury 03/23/23 Foot Right;Medial Unstageable - Full thickness tissue loss in which the base of the injury is covered by slough (yellow, tan, gray, green or brown) and/or eschar (tan, brown or black) in the wound bed. (Active)  03/23/23 2315  Location: Foot  Location  Orientation: Right;Medial  Staging: Unstageable - Full thickness tissue loss in which the base of the injury is covered by slough (yellow, tan, gray, green or  brown) and/or eschar (tan, brown or black) in the wound bed.  Wound Description (Comments):   Present on Admission: Yes     Pressure Injury 03/24/23 Hip Anterior;Left;Proximal Stage 3 -  Full thickness tissue loss. Subcutaneous fat may be visible but bone, tendon or muscle are NOT exposed. (Active)  03/24/23   Location: Hip  Location Orientation: Anterior;Left;Proximal  Staging: Stage 3 -  Full thickness tissue loss. Subcutaneous fat may be visible but bone, tendon or muscle are NOT exposed.  Wound Description (Comments):   Present on Admission: Yes     Diet Orders (From admission, onward)     Start     Ordered   03/30/23 0001  Diet NPO time specified  Diet effective midnight        03/29/23 0802            Objective: Vitals:   03/29/23 1948 03/30/23 0411 03/30/23 0724 03/30/23 0858  BP: 120/63 (!) 151/87 (!) 142/65 (!) 147/74  Pulse: 97 (!) 107 (!) 102 98  Resp: 19 15 18 17   Temp: 98.8 F (37.1 C) 98.3 F (36.8 C) 98.8 F (37.1 C) 98.7 F (37.1 C)  TempSrc:   Oral Oral  SpO2: 100% 94% 92% 98%  Weight:      Height:        Intake/Output Summary (Last 24 hours) at 03/30/2023 1135 Last data filed at 03/30/2023 1126 Gross per 24 hour  Intake 1440 ml  Output 1400 ml  Net 40 ml   Filed Weights   03/23/23 1641  Weight: 60.6 kg    Scheduled Meds:  [MAR Hold] amLODipine  5 mg Oral Daily   [MAR Hold] ascorbic acid  500 mg Oral Daily   [MAR Hold] atorvastatin  80 mg Oral QHS   [MAR Hold] enoxaparin (LOVENOX) injection  40 mg Subcutaneous Q24H   [MAR Hold] gabapentin  300 mg Oral TID   [MAR Hold] insulin aspart  0-5 Units Subcutaneous QHS   [MAR Hold] insulin aspart  0-9 Units Subcutaneous TID WC   [MAR Hold] insulin glargine-yfgn  5 Units Subcutaneous q AM   [MAR Hold] leptospermum manuka honey  1 Application Topical Daily   [MAR Hold] pantoprazole  40 mg Oral BID   [MAR Hold] zinc sulfate  220 mg Oral Daily   Continuous Infusions:  [MAR Hold] ceFEPime (MAXIPIME)  IV 0 g (03/30/23 0654)   lactated ringers Stopped (03/30/23 8657)   lactated ringers 10 mL/hr at 03/30/23 0903   [MAR Hold] vancomycin 750 mg (03/30/23 0915)    Nutritional status Signs/Symptoms: estimated needs Interventions: Magic cup, Carn[employer] Instant Breakfast, Refer to RD note for recommendations Body mass index is 24.44 kg/m.  Data Reviewed:   CBC: Recent Labs  Lab 03/23/23 2021 03/24/23 0357 03/26/23 0418 03/27/23 0242 03/28/23 0625 03/29/23 0326  WBC 6.9 7.7 7.6 8.4 7.6 8.9  NEUTROABS 5.1 5.7  --   --   --   --   HGB 9.3* 8.1* 8.3* 8.4* 8.6* 8.6*  HCT 30.4* 26.4* 26.9* 27.4* 27.3* 27.5*  MCV 79.2* 80.0 77.3* 78.1* 77.3* 78.6*  PLT 278 280 295 297 209 319   Basic Metabolic Panel: Recent Labs  Lab 03/24/23 0357 03/26/23 0418 03/27/23 0242 03/28/23 0625 03/29/23 0326  NA 139 137 137 139 135  K 3.6 3.9 3.5 3.6 3.3*  CL  103 107 105 104 104  CO2 26 23 24 24 24   GLUCOSE 167* 224* 170* 203* 153*  BUN 19 11 7* 7* 6*  CREATININE 0.64 1.06* 0.62 0.63 0.67  CALCIUM 9.2 8.9 9.1 8.6* 8.8*  MG 1.9 1.7 1.6* 2.0 1.9  PHOS  --  2.3* 2.3*  --   --    GFR: Estimated Creatinine Clearance: 51.3 mL/min (by C-G formula based on SCr of 0.67 mg/dL). Liver Function Tests: Recent Labs  Lab 03/23/23 2021 03/26/23 0418  AST 14*  --   ALT 15  --   ALKPHOS 82  --   BILITOT 0.2*  --   PROT 7.4  --   ALBUMIN 2.6* 2.3*   No results for input(s): "LIPASE", "AMYLASE" in the last 168 hours. Recent Labs  Lab 03/25/23 1524  AMMONIA 31   Coagulation Profile: No results for input(s): "INR", "PROTIME" in the last 168 hours. Cardiac Enzymes: No results for input(s): "CKTOTAL", "CKMB", "CKMBINDEX", "TROPONINI" in the last 168 hours. BNP (last 3 results) No results for input(s): "PROBNP" in the last 8760 hours. HbA1C: No results for input(s): "HGBA1C" in the last 72 hours. CBG: Recent Labs  Lab 03/29/23 0741 03/29/23 1125 03/29/23 1558 03/29/23 1950 03/30/23 0722   GLUCAP 159* 140* 182* 218* 195*   Lipid Profile: No results for input(s): "CHOL", "HDL", "LDLCALC", "TRIG", "CHOLHDL", "LDLDIRECT" in the last 72 hours. Thyroid Function Tests: No results for input(s): "TSH", "T4TOTAL", "FREET4", "T3FREE", "THYROIDAB" in the last 72 hours. Anemia Panel: No results for input(s): "VITAMINB12", "FOLATE", "FERRITIN", "TIBC", "IRON", "RETICCTPCT" in the last 72 hours. Sepsis Labs: Recent Labs  Lab 03/23/23 2253  LATICACIDVEN 1.0    Recent Results (from the past 240 hour(s))  Blood Cultures x 2 sites     Status: None   Collection Time: 03/23/23  8:21 PM   Specimen: BLOOD  Result Value Ref Range Status   Specimen Description   Final    BLOOD SITE NOT SPECIFIED Performed at [facility], [address] [place], [place] [address]    Special Requests   Final    BOTTLES DRAWN AEROBIC AND ANAEROBIC Blood Culture adequate volume Performed at [facility], [address], [place], [place] [address]    Culture   Final    NO GROWTH 5 DAYS Performed at [facility], [address] [place], [place] [address]    Report Status 03/29/2023 FINAL  Final  Blood Cultures x 2 sites     Status: None   Collection Time: 03/24/23 12:13 AM   Specimen: BLOOD RIGHT ARM  Result Value Ref Range Status   Specimen Description   Final    BLOOD RIGHT ARM Performed at [facility], [address], [place], [place] [address]    Special Requests   Final    BOTTLES DRAWN AEROBIC ONLY Blood Culture results may not be optimal due to an inadequate volume of blood received in culture bottles Performed at [facility], [address] [place], [place] [address]    Culture   Final    NO GROWTH 5 DAYS Performed at [facility], [address] [place], [place] [address]    Report Status 03/29/2023 FINAL  Final         Radiology Studies: No results found.         LOS: 7 days   Time spent=  35 mins    Miguel Rota, MD [facility]  If 7PM-7AM, please contact night-coverage  03/30/2023, 11:35 AM

## 2023-03-31 ENCOUNTER — Encounter (HOSPITAL_COMMUNITY): Payer: Self-pay | Admitting: Vascular Surgery

## 2023-03-31 DIAGNOSIS — M86171 Other acute osteomyelitis, right ankle and foot: Secondary | ICD-10-CM | POA: Diagnosis not present

## 2023-03-31 DIAGNOSIS — Z515 Encounter for palliative care: Secondary | ICD-10-CM | POA: Diagnosis not present

## 2023-03-31 LAB — GLUCOSE, CAPILLARY
Glucose-Capillary: 170 mg/dL — ABNORMAL HIGH (ref 70–99)
Glucose-Capillary: 264 mg/dL — ABNORMAL HIGH (ref 70–99)
Glucose-Capillary: 112 mg/dL — ABNORMAL HIGH (ref 70–99)
Glucose-Capillary: 167 mg/dL — ABNORMAL HIGH (ref 70–99)

## 2023-03-31 LAB — BASIC METABOLIC PANEL
Anion gap: 10 (ref 5–15)
BUN: 13 mg/dL (ref 8–23)
CO2: 21 mmol/L — ABNORMAL LOW (ref 22–32)
Calcium: 8.6 mg/dL — ABNORMAL LOW (ref 8.9–10.3)
Chloride: 105 mmol/L (ref 98–111)
Creatinine, Ser: 0.75 mg/dL (ref 0.44–1.00)
GFR, Estimated: >60 mL/min (ref >60)
Glucose, Bld: 174 mg/dL — ABNORMAL HIGH (ref 70–99)
Potassium: 4.5 mmol/L (ref 3.5–5.1)
Sodium: 136 mmol/L (ref 135–145)

## 2023-03-31 LAB — CBC
HCT: 22.7 % — ABNORMAL LOW (ref 36.0–46.0)
Hemoglobin: 7 g/dL — ABNORMAL LOW (ref 12.0–15.0)
MCH: 23.5 pg — ABNORMAL LOW (ref 26.0–34.0)
MCHC: 30.8 g/dL (ref 30.0–36.0)
MCV: 76.2 fL — ABNORMAL LOW (ref 80.0–100.0)
Platelets: 268 10*3/uL (ref 150–400)
RBC: 2.98 MIL/uL — ABNORMAL LOW (ref 3.87–5.11)
RDW: 17.1 % — ABNORMAL HIGH (ref 11.5–15.5)
WBC: 9.4 10*3/uL (ref 4.0–10.5)
nRBC: 0 % (ref 0.0–0.2)

## 2023-03-31 LAB — MAGNESIUM: Magnesium: 1.8 mg/dL (ref 1.7–2.4)

## 2023-03-31 MED ORDER — INSULIN ASPART 100 UNIT/ML IJ SOLN
3.0000 [IU] | Freq: Three times a day (TID) | INTRAMUSCULAR | Status: DC
  Administered 2023-03-31 – 2023-04-02 (×8): 3 [IU] via SUBCUTANEOUS

## 2023-03-31 NOTE — Progress Notes (Signed)
PROGRESS NOTE    Crystal Moore  ZOX:[id number] DOB: 1945-10-20 DOA: 03/23/2023 PCP: Lorenda Ishihara, MD     Brief Narrative:  77 year old with history of dementia, stage IV NSCLC on palliative radiation, CVA with left-sided hemiparesis and contracture, PAD, ILD with hypoxia 2 L nasal cannula, COPD, gastric AVM, DM2 with right diabetic foot ulcer sent to the hospital by PCP due to concerns of osteomyelitis.  Upon admission MRI confirmed osteomyelitis of her foot, orthopedic was consulted who recommended also consulting vascular due to concerns of significant PAD.  Patient was transferred to [facility] for vascular surgery evaluation.  After discussions with palliative care team, patient eventually underwent right sided AKA on 9/3.     Assessment & Plan:  Principal Problem:   Osteomyelitis of foot, right, acute (HCC) Active Problems:   COPD (chronic obstructive pulmonary disease) (HCC)   Chronic respiratory failure with hypoxia (HCC)   Depression   COPD (chronic obstructive pulmonary disease) (HCC)   CVA (cerebral vascular accident) (HCC)   Dementia without behavioral disturbance (HCC)   History of CVA (cerebrovascular accident)   Pressure injury of skin    Osteomyelitis of foot, right, acute status post right AKA 9/3:  Noted to have osteomyelitis of the right foot on the MRI.  Orthopedic, vascular and palliative care team was consulted and eventually underwent right-sided AKA.  We will discontinue antibiotics today.   Acute toxic encephalopathy: resolved.  Received Ativan and Dilaudid prior to MRI.  Avoid sedating meds.  CT of the head is negative.  Supportive care.   Chronic seizure PD/ILD/chronic hypoxic RF:  Continue 2 L nasal cannula.   IDDM-2 with hyperglycemia and diabetic wound with osteomyelitis: sliding scale and Accu-Chek.  Will further adjust Semglee and Premeal insulin.   History of CVA with residual left-sided weakness and spasticity -Continue home  meds except Plavix -PT/OT   Essential hypertension:  Norvasc, IV as needed   Hyperlipidemia -Continue home Lipitor   Dementia without behavioral disturbance: Seems advanced.  Awake and alert but only oriented to self. -Reorientation and delirium precaution   Normocytic anemia: H&H stable. Closely monitor hemoglobin   Goal of care counseling: Seen by palliative care service.  Family wishes her to be full code     DVT prophylaxis: enoxaparin (LOVENOX) Code Status: Full code Family Communication: Family updated periodically.  Status is: Inpatient Remains inpatient appropriate because: Postop management.        Subjective: Patient doing okay, no complaints this morning.   Examination:  General exam: Appears calm and comfortable, cachectic frail Respiratory system: Clear to auscultation. Respiratory effort normal. Cardiovascular system: S1 & S2 heard, RRR. No JVD, murmurs, rubs, gallops or clicks. No pedal edema. Gastrointestinal system: Abdomen is nondistended, soft and nontender. No organomegaly or masses felt. Normal bowel sounds heard. Central nervous system: Alert and oriented. No focal neurological deficits. Extremities: R lower extremity AKA noted Skin: Dressing is in place at the surgical site in the right lower extremity Psychiatry: Judgement and insight appear normal. Mood & affect appropriate.   Pressure Injury 03/24/23 Hip Anterior;Left;Proximal Stage 3 -  Full thickness tissue loss. Subcutaneous fat may be visible but bone, tendon or muscle are NOT exposed. (Active)  03/24/23   Location: Hip  Location Orientation: Anterior;Left;Proximal  Staging: Stage 3 -  Full thickness tissue loss. Subcutaneous fat may be visible but bone, tendon or muscle are NOT exposed.  Wound Description (Comments):   Present on Admission: Yes     Diet Orders (From admission,  onward)     Start     Ordered   03/30/23 1307  Diet regular Room service appropriate? Yes; Fluid  consistency: Thin  Diet effective now       Question Answer Comment  Room service appropriate? Yes   Fluid consistency: Thin      03/30/23 1306            Objective: Vitals:   03/31/23 0015 03/31/23 0121 03/31/23 0500 03/31/23 0752  BP: (!) 90/48 (!) 112/50 124/65 (!) 108/58  Pulse: 70 73 86 80  Resp: 16 16 16 17   Temp: 98.2 F (36.8 C) 98.7 F (37.1 C) 98.7 F (37.1 C) 99 F (37.2 C)  TempSrc: Oral  Oral   SpO2: 92% 100% 98% 96%  Weight:      Height:        Intake/Output Summary (Last 24 hours) at 03/31/2023 1205 Last data filed at 03/31/2023 0500 Gross per 24 hour  Intake 376.4 ml  Output 800 ml  Net -423.6 ml   Filed Weights   03/23/23 1641  Weight: 60.6 kg    Scheduled Meds:  amLODipine  5 mg Oral Daily   ascorbic acid  500 mg Oral Daily   atorvastatin  80 mg Oral QHS   docusate sodium  100 mg Oral Daily   enoxaparin (LOVENOX) injection  40 mg Subcutaneous Q24H   gabapentin  300 mg Oral TID   insulin aspart  0-5 Units Subcutaneous QHS   insulin aspart  0-9 Units Subcutaneous TID WC   insulin aspart  3 Units Subcutaneous TID WC   insulin glargine-yfgn  5 Units Subcutaneous q AM   leptospermum manuka honey  1 Application Topical Daily   pantoprazole  40 mg Oral BID   zinc sulfate  220 mg Oral Daily   Continuous Infusions:  Nutritional status Signs/Symptoms: estimated needs Interventions: Magic cup, [employer] Instant Breakfast, Refer to RD note for recommendations Body mass index is 24.44 kg/m.  Data Reviewed:   CBC: Recent Labs  Lab 03/27/23 0242 03/28/23 0625 03/29/23 0326 03/30/23 1527 03/31/23 0831  WBC 8.4 7.6 8.9 10.2 9.4  HGB 8.4* 8.6* 8.6* 8.0* 7.0*  HCT 27.4* 27.3* 27.5* 25.9* 22.7*  MCV 78.1* 77.3* 78.6* 76.6* 76.2*  PLT 297 209 319 292 268   Basic Metabolic Panel: Recent Labs  Lab 03/26/23 0418 03/27/23 0242 03/28/23 0625 03/29/23 0326 03/30/23 1527 03/31/23 0831  NA 137 137 139 135 134* 136  K 3.9 3.5 3.6 3.3* 4.0 4.5   CL 107 105 104 104 102 105  CO2 23 24 24 24 22  21*  GLUCOSE 224* 170* 203* 153* 252* 174*  BUN 11 7* 7* 6* 12 13  CREATININE 1.06* 0.62 0.63 0.67 0.79 0.75  CALCIUM 8.9 9.1 8.6* 8.8* 9.2 8.6*  MG 1.7 1.6* 2.0 1.9 1.7 1.8  PHOS 2.3* 2.3*  --   --   --   --    GFR: Estimated Creatinine Clearance: 51.3 mL/min (by C-G formula based on SCr of 0.75 mg/dL). Liver Function Tests: Recent Labs  Lab 03/26/23 0418  ALBUMIN 2.3*   No results for input(s): "LIPASE", "AMYLASE" in the last 168 hours. Recent Labs  Lab 03/25/23 1524  AMMONIA 31   Coagulation Profile: No results for input(s): "INR", "PROTIME" in the last 168 hours. Cardiac Enzymes: No results for input(s): "CKTOTAL", "CKMB", "CKMBINDEX", "TROPONINI" in the last 168 hours. BNP (last 3 results) No results for input(s): "PROBNP" in the last 8760 hours. HbA1C: No results  for input(s): "HGBA1C" in the last 72 hours. CBG: Recent Labs  Lab 03/30/23 1141 03/30/23 1619 03/30/23 2130 03/31/23 0752 03/31/23 1146  GLUCAP 162* 252* 119* 170* 264*   Lipid Profile: No results for input(s): "CHOL", "HDL", "LDLCALC", "TRIG", "CHOLHDL", "LDLDIRECT" in the last 72 hours. Thyroid Function Tests: No results for input(s): "TSH", "T4TOTAL", "FREET4", "T3FREE", "THYROIDAB" in the last 72 hours. Anemia Panel: No results for input(s): "VITAMINB12", "FOLATE", "FERRITIN", "TIBC", "IRON", "RETICCTPCT" in the last 72 hours. Sepsis Labs: No results for input(s): "PROCALCITON", "LATICACIDVEN" in the last 168 hours.  Recent Results (from the past 240 hour(s))  Blood Cultures x 2 sites     Status: None   Collection Time: 03/23/23  8:21 PM   Specimen: BLOOD  Result Value Ref Range Status   Specimen Description   Final    BLOOD SITE NOT SPECIFIED Performed at [facility], [address], [place], [place] [address]    Special Requests   Final    BOTTLES DRAWN AEROBIC AND ANAEROBIC Blood Culture adequate volume Performed at  [facility], [address] [place], [place] [address]    Culture   Final    NO GROWTH 5 DAYS Performed at [facility], [address] [place], [place] [address]    Report Status 03/29/2023 FINAL  Final  Blood Cultures x 2 sites     Status: None   Collection Time: 03/24/23 12:13 AM   Specimen: BLOOD RIGHT ARM  Result Value Ref Range Status   Specimen Description   Final    BLOOD RIGHT ARM Performed at [facility], [address] [place], [place] [address]    Special Requests   Final    BOTTLES DRAWN AEROBIC ONLY Blood Culture results may not be optimal due to an inadequate volume of blood received in culture bottles Performed at [facility], [address], [place], [place] [address]    Culture   Final    NO GROWTH 5 DAYS Performed at [facility], [address], [place], [place] [address]    Report Status 03/29/2023 FINAL  Final         Radiology Studies: No results found.         LOS: 8 days   Time spent= 35 mins    Miguel Rota, MD [facility]  If 7PM-7AM, please contact night-coverage  03/31/2023, 12:05 PM

## 2023-03-31 NOTE — Progress Notes (Signed)
Occupational Therapy Discharge Patient Details Name: Crystal Moore MRN: [id number] DOB: [birth year] Today's Date: 03/31/2023 Time: 4098-1191 OT Time Calculation (min): 31 min  Patient discharged from OT services secondary to patient is total care at baseline and no further OT needs identified.  Please see latest therapy progress note for current level of functioning and progress toward goals.    Progress and discharge plan discussed with patient and/or caregiver: Patient unable to participate in discharge planning and no caregivers available  GO  [name], OTR/L Acute Rehabilitation Services [phone]   [name] 03/31/2023, 4:06 PM

## 2023-03-31 NOTE — Progress Notes (Signed)
   Palliative Medicine Inpatient Follow Up Note HPI: 77 y.o. female  with past medical history of dementia, stage IV NSCLC on palliative radiation, CVA with left-sided hemiparesis and contracture, PAD, ILD with hypoxia 2 L nasal cannula, COPD, gastric AVM, DM2  admitted on 03/23/2023 with right diabetic foot ulcer sent by PCP due to concerns of osteomyelitis.    Upon admission MRI confirmed osteomyelitis of her foot, orthopedic was consulted who recommended also consulting vascular due to concerns of significant PAD. Plan is for right AKA on 9/3.  Today's Discussion 03/31/2023  *Please note that this is a verbal dictation therefore any spelling or grammatical errors are due to the "Dragon Medical One" system interpretation.  Chart reviewed inclusive of vital signs, progress notes, laboratory results, and diagnostic images. One dose of IV dilaudid in the last 24 hours.   I met with Sarah-Jane at bedside this morning. She was arousable to voice though groaning. She otherwise did not thread coherent words.   I was able to touch base with the nursing staff who endorse that Esabel has been doing fairly, they deny palliative concerns.   Questions and concerns addressed/Palliative Support Provided.   Objective Assessment: Vital Signs Vitals:   03/31/23 0500 03/31/23 0752  BP: 124/65 (!) 108/58  Pulse: 86 80  Resp: 16 17  Temp: 98.7 F (37.1 C) 99 F (37.2 C)  SpO2: 98% 96%    Intake/Output Summary (Last 24 hours) at 03/31/2023 1304 Last data filed at 03/31/2023 0500 Gross per 24 hour  Intake 376.4 ml  Output 800 ml  Net -423.6 ml   Last Weight  Most recent update: 03/23/2023  4:41 PM    Weight  60.6 kg (133 lb 9.6 oz)            Gen:  Elderly AA F in NAD HEENT: moist mucous membranes CV: Regular rate and rhythm  PULM:  On 2LPM , breathing is even and nonlabored ABD: soft/nontender  EXT: R AKA Neuro: Moaning this morning, less verbal  SUMMARY OF RECOMMENDATIONS   Full Code /  Full scope of care  Plan for amputation this morning  Emotional support provided to patient and her daughter  LE pain: - Tylenol 650mg  PO Q6H PRN - Gabapentin 300mg  PO TID - Oxycodone 5mg  PO Q4H PRN - Dilaudid 0.5mg  IV Q3H PRN  Ongoing peripheral PMT support as goals are clear  Billing based on MDM: Low ______________________________________________________________________________________ Lamarr Lulas [place] Palliative Medicine Team Team Cell Phone: [phone] Please utilize secure chat with additional questions, if there is no response within 30 minutes please call the above phone number  Palliative Medicine Team providers are available by phone from 7am to 7pm daily and can be reached through the team cell phone.  Should this patient require assistance outside of these hours, please call the patient's attending physician.

## 2023-03-31 NOTE — Care Management Important Message (Signed)
Important Message  Patient Details  Name: STEFANNIE MESSERLI MRN: [id number] Date of Birth: [birth year]   Medicare Important Message Given:  Yes     Sherilyn Banker 03/31/2023, 1:21 PM

## 2023-03-31 NOTE — Progress Notes (Signed)
This chaplain responded to PMT NP-Michelle consult for spiritual care. The Pt. is awake after working with PT.   The chaplain sat at the Pt. bedside and invited conversation about the flowers in the room. The Pt. acknowledged the rose in her bouquet and her daughter's name [name].   The chaplain shared music from the late 60s with the Pt. At this time the Pt. is resting comfortably.  This chaplain is available for F/U spiritual care as needed.  Chaplain Stephanie Acre [phone]

## 2023-03-31 NOTE — Progress Notes (Signed)
Physical Therapy Discharge Patient Details Name: Crystal Moore MRN: [id number] DOB: July 01, 1946 Today's Date: 03/31/2023 Time: 8469-6295 PT Time Calculation (min) (ACUTE ONLY): 31 min  Patient discharged from PT services secondary to  pt at baseline level of function and unable to participate in therapy given level of cognition and lack of command follow. .  Please see latest therapy progress note for current level of functioning and progress toward goals.    Progress and discharge plan discussed with patient and/or caregiver: Patient unable to participate in discharge planning and no caregivers available  GP    Lanora Manis B. Beverely Risen PT, DPT Acute Rehabilitation Services Please use secure chat or  Call Office [phone]  [name] [facility] 03/31/2023, 4:41 PM

## 2023-03-31 NOTE — Progress Notes (Signed)
Occupational Therapy Treatment Patient Details Name: Crystal Moore MRN: [id number] DOB: [birth year] Today's Date: 03/31/2023   History of present illness The pt is a 77 yo female presenting 8/27 with R foot wounds concerning for osteomyelitis. R AKA completed 9/3. PMH: multiple prior strokes with residual left-sided weakness, COPD, PVD, gastric AVMs, Stage IV non-small cell lung cancer on palliative radiation, interstitial lung disease with chronic hypoxemia on 2L, diabetes, and dementia.   OT comments  Patient seen in conjunction with PT to assess status post R AKA. Patient total A for all aspects of care with the exception of minimal grooming tasks. Patient unable to follow commands consistently, and is disoriented with the exception of responding to her name. Patient was at home with asssitance prior to admission, with family providing total care and having the appropriate equipment at home per chart review. Patient has no acute rehab needs, and can discharge home under the care of family when medically appropriate. OT is signing off and discharging this patient at this time.      If plan is discharge home, recommend the following:  A lot of help with walking and/or transfers;A lot of help with bathing/dressing/bathroom;Direct supervision/assist for financial management;Assistance with cooking/housework;Assistance with feeding;Help with stairs or ramp for entrance;Direct supervision/assist for medications management;Assist for transportation;Supervision due to cognitive status   Equipment Recommendations  None recommended by OT (Family has DME needed)    Recommendations for Other Services      Precautions / Restrictions Precautions Precautions: Fall Precaution Comments: significant contractures from hx of CVA with L-sided weakness. PRAFO boots LLE Restrictions Weight Bearing Restrictions: No       Mobility Bed Mobility Overal bed mobility: Needs Assistance Bed Mobility: Supine to  Sit, Sit to Supine     Supine to sit: Total assist, +2 for physical assistance Sit to supine: Total assist, +2 for physical assistance   General bed mobility comments: Total A x2 to transition EOB and to manage LLE and trunk, patient unable to maintain trunk control when positioned EOB for longer than 2 seconds. Heavy posterior lean throughout.    Transfers                   General transfer comment: deferred as per chart pt is bedbound and family has hoyer lift, was able to progress L knee ROM to 90 deg but not much past 90 deg flexion so unable to stand.     Balance Overall balance assessment: Needs assistance Sitting-balance support: Single extremity supported, Feet supported Sitting balance-Leahy Scale: Zero Sitting balance - Comments: can no longer sit statically since AKA completed Postural control: Right lateral lean, Posterior lean                                 ADL either performed or assessed with clinical judgement   ADL Overall ADL's : Needs assistance/impaired Eating/Feeding: Total assistance Eating/Feeding Details (indicate cue type and reason): did not attempt to grab spoon for ice cream Grooming: Sitting;Minimal assistance;Wash/dry hands;Wash/dry face                               Functional mobility during ADLs: Total assistance;+2 for safety/equipment;+2 for physical assistance General ADL Comments: Patient total A for all aspects of care with the exception of minimal grooming tasks. Patient unable to follow commands consistently, and is disoriented with the exception of  responding to her name. Patient was at home with asssitance prior to admission, with patient providing total care and having the appropriate equipment at home per chart review. Patient has no acute rehab needs, and can discharge home under the care of family when medically appropriate. OT is signing off and discharging this patient at this time.    Extremity/Trunk  Assessment              Vision       Perception     Praxis      Cognition Arousal: Lethargic Behavior During Therapy: Flat affect Overall Cognitive Status: No family/caregiver present to determine baseline cognitive functioning                                 General Comments: Patient responding consistently to name, was able to state her favorite flower (rose) but not type of music (any that you like to listen to)        Exercises      Shoulder Instructions       General Comments      Pertinent Vitals/ Pain       Pain Assessment Pain Assessment: PAINAD Breathing: normal Negative Vocalization: none Facial Expression: sad, frightened, frown Body Language: tense, distressed pacing, fidgeting Consolability: distracted or reassured by voice/touch PAINAD Score: 3 Pain Location: with ROM of LE Pain Descriptors / Indicators: Discomfort, Grimacing Pain Intervention(s): Limited activity within patient's tolerance, Monitored during session, Repositioned  Home Living                                          Prior Functioning/Environment              Frequency  Min 1X/week        Progress Toward Goals  OT Goals(current goals can now be found in the care plan section)  Progress towards OT goals: Not progressing toward goals - comment (minimal comprehension noted)  Acute Rehab OT Goals OT Goal Formulation: Patient unable to participate in goal setting Time For Goal Achievement: 04/09/23 Potential to Achieve Goals: Poor  Plan      Co-evaluation    PT/OT/SLP Co-Evaluation/Treatment: Yes Reason for Co-Treatment: Necessary to address cognition/behavior during functional activity;For patient/therapist safety;To address functional/ADL transfers PT goals addressed during session: Mobility/safety with mobility;Balance;Strengthening/ROM OT goals addressed during session: ADL's and self-care;Proper use of Adaptive equipment and  DME      AM-PAC OT "6 Clicks" Daily Activity     Outcome Measure   Help from another person eating meals?: Total Help from another person taking care of personal grooming?: A Little Help from another person toileting, which includes using toliet, bedpan, or urinal?: Total Help from another person bathing (including washing, rinsing, drying)?: Total Help from another person to put on and taking off regular upper body clothing?: Total Help from another person to put on and taking off regular lower body clothing?: Total 6 Click Score: 8    End of Session    OT Visit Diagnosis: Muscle weakness (generalized) (M62.81);Pain Pain - Right/Left: Left Pain - part of body: Leg   Activity Tolerance Patient limited by fatigue;Patient limited by lethargy   Patient Left in bed;with call bell/phone within reach;with bed alarm set;Other (comment) (Chaplain present)   Nurse Communication Mobility status        Time:  8295-6213 OT Time Calculation (min): 31 min  Charges: OT General Charges $OT Visit: 1 Visit OT Treatments $Self Care/Home Management : 8-22 mins  Pollyann Glen E. Amro Winebarger, OTR/L Acute Rehabilitation Services [phone]   [name] 03/31/2023, 4:05 PM

## 2023-03-31 NOTE — Evaluation (Addendum)
Physical Therapy Re-Evaluation and Discharge Patient Details Name: Crystal Moore MRN: [id number] DOB: Oct 04, 1945 Today's Date: 03/31/2023  History of Present Illness  The pt is a 77 yo female presenting 8/27 with R foot wounds concerning for osteomyelitis. R AKA completed 9/3. PMH: multiple prior strokes with residual left-sided weakness, COPD, PVD, gastric AVMs, Stage IV non-small cell lung cancer on palliative radiation, interstitial lung disease with chronic hypoxemia on 2L, diabetes, and dementia.  Clinical Impression  Pt seen with OT for ReEvaluation of pt mobility s/p 9/3 AKA. Pt continues to have limit response to questions and is oriented only to herself. Pt unable to follow any commands and is total A for coming to sit on the EoB.  With total Ax2 pt brought to seated on EoB. Pt requires posterior support given decrease balance as result of R LE AKA. Pt only able to retain seated balance with increased grip of EoB with R hand but when pt lets go unable to maintain. Pt requires total A for all bed mobility, request RN order air mattress to reduce likelihood of additional pressure injuries. Although family not present on Re-Evaluation pt is likely at her baseline level of function and given pt with no command follow pt would not benefit from additional PT services. Family was providing total care and can return to pt care at discharge.        If plan is discharge home, recommend the following: A lot of help with walking and/or transfers;A lot of help with bathing/dressing/bathroom;Assistance with feeding;Direct supervision/assist for medications management;Direct supervision/assist for financial management;Help with stairs or ramp for entrance;Assist for transportation   Can travel by private vehicle    No    Equipment Recommendations None recommended by PT (pt has needed DME)     Functional Status Assessment Patient has had a recent decline in their functional status and demonstrates the  ability to make significant improvements in function in a reasonable and predictable amount of time.     Precautions / Restrictions Precautions Precautions: Fall Precaution Comments: significant contractures from hx of CVA with L-sided weakness. PRAFO boots LLE Restrictions Weight Bearing Restrictions: No      Mobility  Bed Mobility Overal bed mobility: Needs Assistance Bed Mobility: Supine to Sit, Sit to Supine     Supine to sit: Total assist, +2 for physical assistance Sit to supine: Total assist, +2 for physical assistance   General bed mobility comments: Total A x2 to transition EOB and to manage LLE and trunk, patient unable to maintain trunk control when positioned EOB for longer than 2 seconds. Heavy posterior lean throughout.    Transfers                   General transfer comment: deferred as per chart pt is bedbound and family has hoyer lift, was able to progress L knee ROM to 90 deg but not much past 90 deg flexion so unable to stand.        Balance Overall balance assessment: Needs assistance Sitting-balance support: Single extremity supported, Feet supported Sitting balance-Leahy Scale: Zero Sitting balance - Comments: can no longer sit statically since AKA completed Postural control: Right lateral lean, Posterior lean                                   Pertinent Vitals/Pain Pain Assessment Pain Assessment: PAINAD Breathing: normal Negative Vocalization: none Facial Expression: sad, frightened, frown Body  Language: tense, distressed pacing, fidgeting Consolability: distracted or reassured by voice/touch PAINAD Score: 3 Pain Location: with ROM of LE Pain Descriptors / Indicators: Discomfort, Grimacing Pain Intervention(s): Limited activity within patient's tolerance, Monitored during session, Repositioned    Home Living Family/patient expects to be discharged to:: Private residence Living Arrangements: Children Available Help at  Discharge: Family;Home health;Available 24 hours/day Type of Home: House Home Access: Stairs to enter Entrance Stairs-Rails: None Entrance Stairs-Number of Steps: 1   Home Layout: Two level;Able to live on main level with bedroom/bathroom Home Equipment: Gilmer Mor - single point;Wheelchair - manual;Hand held shower head;Tub bench;BSC/3in1;Cane - quad;Grab bars - tub/shower;Transport chair;Hospital bed;Other (comment) Additional Comments: PLOF and home setup obtained from prior notes, pt unable to assist reporting that she lives alone    Prior Function Prior Level of Function : Needs assist;Patient poor historian/Family not available                     Extremity/Trunk Assessment        Lower Extremity Assessment RLE Deficits / Details: new R AKA LLE Deficits / Details: knee ROM limited to ~90 deg flexion, no active movement to command    Cervical / Trunk Assessment Cervical / Trunk Assessment: Kyphotic  Communication      Cognition Arousal: Lethargic Behavior During Therapy: Flat affect Overall Cognitive Status: No family/caregiver present to determine baseline cognitive functioning                                 General Comments: Patient responding consistently to name, was able to state her favorite flower (rose) but not type of music (any that you like to listen to)        General Comments General comments (skin integrity, edema, etc.): significant contracture of L LE        Assessment/Plan    PT Assessment Patient does not need any further PT services  PT Problem List Decreased range of motion;Decreased strength;Decreased activity tolerance;Decreased balance;Decreased coordination;Decreased mobility           PT Goals (Current goals can be found in the Care Plan section)  Acute Rehab PT Goals Patient Stated Goal: none stated PT Goal Formulation: Patient unable to participate in goal setting Time For Goal Achievement: 04/09/23 Potential to  Achieve Goals: Poor         Co-evaluation PT/OT/SLP Co-Evaluation/Treatment: Yes Reason for Co-Treatment: Necessary to address cognition/behavior during functional activity;For patient/therapist safety;To address functional/ADL transfers PT goals addressed during session: Mobility/safety with mobility;Balance;Strengthening/ROM OT goals addressed during session: ADL's and self-care;Proper use of Adaptive equipment and DME       AM-PAC PT "6 Clicks" Mobility  Outcome Measure Help needed turning from your back to your side while in a flat bed without using bedrails?: Total Help needed moving from lying on your back to sitting on the side of a flat bed without using bedrails?: Total Help needed moving to and from a bed to a chair (including a wheelchair)?: Total Help needed standing up from a chair using your arms (e.g., wheelchair or bedside chair)?: Total Help needed to walk in hospital room?: Total Help needed climbing 3-5 steps with a railing? : Total 6 Click Score: 6    End of Session Equipment Utilized During Treatment: Oxygen Activity Tolerance: Patient tolerated treatment well Patient left: in bed;with call bell/phone within reach;with bed alarm set Nurse Communication: Mobility status PT Visit Diagnosis: Muscle weakness (generalized) (  M62.81)    Time: 0981-1914 PT Time Calculation (min) (ACUTE ONLY): 31 min   Charges:   PT Evaluation $PT Re-evaluation: 1 Re-eval   PT General Charges $$ ACUTE PT VISIT: 1 Visit         Niala Stcharles B. Beverely Risen PT, DPT Acute Rehabilitation Services Please use secure chat or  Call Office [phone]   [name] [facility] 03/31/2023, 4:32 PM

## 2023-03-31 NOTE — Progress Notes (Addendum)
  Progress Note    03/31/2023 8:04 AM 1 Day Post-Op  Subjective:  somnolent but nodding and shaking head to answer questions   Vitals:   03/31/23 0500 03/31/23 0752  BP: 124/65 (!) 108/58  Pulse: 86 80  Resp: 16 17  Temp: 98.7 F (37.1 C) 99 F (37.2 C)  SpO2: 98% 96%   Physical Exam: Lungs:  non labored Incisions:  R AKA dressing c/d/i Neurologic: somnolent  CBC    Component Value Date/Time   WBC 10.2 03/30/2023 1527   RBC 3.38 (L) 03/30/2023 1527   HGB 8.0 (L) 03/30/2023 1527   HGB 11.1 (L) 11/11/2022 0809   HCT 25.9 (L) 03/30/2023 1527   PLT 292 03/30/2023 1527   PLT 296 11/11/2022 0809   MCV 76.6 (L) 03/30/2023 1527   MCH 23.7 (L) 03/30/2023 1527   MCHC 30.9 03/30/2023 1527   RDW 17.0 (H) 03/30/2023 1527   LYMPHSABS 1.1 03/24/2023 0357   MONOABS 0.5 03/24/2023 0357   EOSABS 0.4 03/24/2023 0357   BASOSABS 0.0 03/24/2023 0357    BMET    Component Value Date/Time   NA 134 (L) 03/30/2023 1527   K 4.0 03/30/2023 1527   CL 102 03/30/2023 1527   CO2 22 03/30/2023 1527   GLUCOSE 252 (H) 03/30/2023 1527   BUN 12 03/30/2023 1527   CREATININE 0.79 03/30/2023 1527   CREATININE 1.03 (H) 11/11/2022 0809   CALCIUM 9.2 03/30/2023 1527   GFRNONAA >60 03/30/2023 1527   GFRNONAA 56 (L) 11/11/2022 0809   GFRAA >60 01/16/2019 0714    INR    Component Value Date/Time   INR 1.1 09/22/2022 1750     Intake/Output Summary (Last 24 hours) at 03/31/2023 0804 Last data filed at 03/31/2023 0500 Gross per 24 hour  Intake 1576.4 ml  Output 1300 ml  Net 276.4 ml     Assessment/Plan:  77 y.o. female is s/p R AKA 1 Day Post-Op   No significant pain on exam; p.o. pain medication on schedule today Vascular will change dressing at the end of this week vs next week Ok to d/c abx from our standpoint   Emilie Rutter, PA-C [facility] [phone] 03/31/2023 8:04 AM  VASCULAR STAFF ADDENDUM: I have independently interviewed and examined the  patient. I agree with the above.  Dressing down Friday   Victorino Sparrow MD [facility] Phone Number: [phone] 03/31/2023 2:46 PM

## 2023-03-31 NOTE — Inpatient Diabetes Management (Signed)
Inpatient Diabetes Program Recommendations  AACE/ADA: New Consensus Statement on Inpatient Glycemic Control (2015)  Target Ranges:  Prepandial:   less than 140 mg/dL      Peak postprandial:   less than 180 mg/dL (1-2 hours)      Critically ill patients:  140 - 180 mg/dL   Lab Results  Component Value Date   GLUCAP 170 (H) 03/31/2023   HGBA1C 6.1 (H) 02/24/2023    Review of Glycemic Control  Latest Reference Range & Units 03/30/23 07:22 03/30/23 11:41 03/30/23 16:19 03/30/23 21:30 03/31/23 07:52  Glucose-Capillary 70 - 99 mg/dL 161 (H) 096 (H) 045 (H) 119 (H) 170 (H)   Diabetes history: DM 2 Outpatient Diabetes medications: Tradjenta 5 mg Daily, Latus 10 units Daily Current orders for Inpatient glycemic control:  Semglee 5 units Daily Novolog 0-9 units tid + hs Novolog 3 units tid meal coverage  A1c 6.1% on 7/31  Inpatient Diabetes Program Recommendations:    -   Increase Semglee to 8 units  Agree with addition  of meal coverage insulin.  Thanks,  Christena Deem RN, MSN, BC-ADM Inpatient Diabetes Coordinator Team Pager [phone] (8a-5p)

## 2023-03-31 NOTE — Consult Note (Signed)
   [facility] Inpatient Consult   03/31/2023  TENEE MARCHANT [birth year] [id number]  Insurance:  Lorenda Ishihara, MD with [facility]    The patient was screened for 30 day readmission hospitalization with noted extreme risk score for unplanned readmission risk with 2 IP stays in 6 months.  The patient was assessed for potential [facility] Care Management service needs for post hospital transition for care coordination. Review of patient's electronic medical record reveals patient is post op day 1 .   Plan:  [facility] Liaison will continue to follow progress and disposition to asess for post hospital community care coordination/management needs.  Referral request for community care coordination: disposition is still uncertain. Spoke with nursing. Will continue to follow.   [facility] Care Management/Population Health does not replace or interfere with any arrangements made by the Inpatient Transition of Care team.   For questions contact:   Charlesetta Shanks, RN, BSN, CCM [place]  [facility] Liaison Direct Dial: [phone] or secure chat Website: [email or web address]

## 2023-04-01 LAB — CBC
HCT: 23.3 % — ABNORMAL LOW (ref 36.0–46.0)
Hemoglobin: 7.2 g/dL — ABNORMAL LOW (ref 12.0–15.0)
MCH: 23.7 pg — ABNORMAL LOW (ref 26.0–34.0)
MCHC: 30.9 g/dL (ref 30.0–36.0)
MCV: 76.6 fL — ABNORMAL LOW (ref 80.0–100.0)
Platelets: 297 10*3/uL (ref 150–400)
RBC: 3.04 MIL/uL — ABNORMAL LOW (ref 3.87–5.11)
RDW: 16.9 % — ABNORMAL HIGH (ref 11.5–15.5)
WBC: 8.6 10*3/uL (ref 4.0–10.5)
nRBC: 0 % (ref 0.0–0.2)

## 2023-04-01 LAB — BASIC METABOLIC PANEL
Anion gap: 5 (ref 5–15)
BUN: 15 mg/dL (ref 8–23)
CO2: 25 mmol/L (ref 22–32)
Calcium: 8.6 mg/dL — ABNORMAL LOW (ref 8.9–10.3)
Chloride: 104 mmol/L (ref 98–111)
Creatinine, Ser: 0.8 mg/dL (ref 0.44–1.00)
GFR, Estimated: >60 mL/min (ref >60)
Glucose, Bld: 206 mg/dL — ABNORMAL HIGH (ref 70–99)
Potassium: 4.1 mmol/L (ref 3.5–5.1)
Sodium: 134 mmol/L — ABNORMAL LOW (ref 135–145)

## 2023-04-01 LAB — GLUCOSE, CAPILLARY
Glucose-Capillary: 204 mg/dL — ABNORMAL HIGH (ref 70–99)
Glucose-Capillary: 191 mg/dL — ABNORMAL HIGH (ref 70–99)
Glucose-Capillary: 269 mg/dL — ABNORMAL HIGH (ref 70–99)
Glucose-Capillary: 148 mg/dL — ABNORMAL HIGH (ref 70–99)
Glucose-Capillary: 185 mg/dL — ABNORMAL HIGH (ref 70–99)

## 2023-04-01 LAB — MAGNESIUM: Magnesium: 2 mg/dL (ref 1.7–2.4)

## 2023-04-01 LAB — SURGICAL PATHOLOGY

## 2023-04-01 MED ORDER — INSULIN GLARGINE-YFGN 100 UNIT/ML ~~LOC~~ SOLN
8.0000 [IU] | Freq: Every day | SUBCUTANEOUS | Status: DC
  Filled 2023-04-01: qty 0.08

## 2023-04-01 MED ORDER — INSULIN ASPART 100 UNIT/ML IJ SOLN
0.0000 [IU] | Freq: Three times a day (TID) | INTRAMUSCULAR | Status: DC
  Administered 2023-04-01: 3 [IU] via SUBCUTANEOUS
  Administered 2023-04-02: 5 [IU] via SUBCUTANEOUS

## 2023-04-01 MED ORDER — INSULIN ASPART 100 UNIT/ML IJ SOLN: 0.0000 [IU] | Freq: Every day | INTRAMUSCULAR | Status: DC

## 2023-04-01 NOTE — Progress Notes (Signed)
PROGRESS NOTE    Crystal Moore  MWU:[id number] DOB: July 22, 1946 DOA: 03/23/2023 PCP: Lorenda Ishihara, MD     Brief Narrative:  77 year old with history of dementia, stage IV NSCLC on palliative radiation, CVA with left-sided hemiparesis and contracture, PAD, ILD with hypoxia 2 L nasal cannula, COPD, gastric AVM, DM2 with right diabetic foot ulcer sent to the hospital by PCP due to concerns of osteomyelitis.  Upon admission MRI confirmed osteomyelitis of her foot, orthopedic was consulted who recommended also consulting vascular due to concerns of significant PAD.  Patient was transferred to [facility] for vascular surgery evaluation.  After discussions with palliative care team, patient eventually underwent right sided AKA on 9/3.     Assessment & Plan:  Principal Problem:   Osteomyelitis of foot, right, acute (HCC) Active Problems:   COPD (chronic obstructive pulmonary disease) (HCC)   Chronic respiratory failure with hypoxia (HCC)   Depression   COPD (chronic obstructive pulmonary disease) (HCC)   CVA (cerebral vascular accident) (HCC)   Dementia without behavioral disturbance (HCC)   History of CVA (cerebrovascular accident)   Pressure injury of skin    Osteomyelitis of foot, right, acute status post right AKA 9/3:  Noted to have osteomyelitis of the right foot on the MRI.  Orthopedic, vascular and palliative care team was consulted and eventually underwent right-sided AKA. Now off abx. Vascular planning dressing take-down tomorrow.   Acute toxic encephalopathy: resolved.  Received Ativan and Dilaudid prior to MRI.  Avoid sedating meds.  CT of the head is negative.  Supportive care.   Chronic seizure PD/ILD/chronic hypoxic RF:  Continue 2 L nasal cannula.   IDDM-2 with hyperglycemia and diabetic wound with osteomyelitis: glucose elevated.   increasing semglee and switching SSI to moderate   History of CVA with residual left-sided weakness and  spasticity -Continue home meds except Plavix, would resume that when cleared for no more procedures planned -PT/OT advising d/c home, has all DME she needs   Essential hypertension:  Norvasc, IV as needed   Hyperlipidemia -Continue home Lipitor   Dementia without behavioral disturbance: Seems advanced.  Awake and alert but only oriented to self. -Reorientation and delirium precaution   Normocytic anemia: H&H stable. Closely monitor hemoglobin   Goal of care counseling: Seen by palliative care service.  Family wishes her to be full code     DVT prophylaxis: enoxaparin (LOVENOX) Code Status: Full code Family Communication: daughter patricia updated telephonically Status is: Inpatient Remains inpatient appropriate because: Postop management.        Subjective: Patient doing okay, no complaints this morning.   Examination:  General exam: Appears calm and comfortable, cachectic frail Respiratory system: Clear to auscultation. Respiratory effort normal. Cardiovascular system: S1 & S2 heard, RRR. No JVD, murmurs, rubs, gallops or clicks. No pedal edema. Gastrointestinal system: Abdomen is nondistended, soft and nontender. No organomegaly or masses felt. Normal bowel sounds heard. Central nervous system: sleeping but rouses Extremities: R lower extremity AKA noted Skin: Dressing is in place at the surgical site in the right lower extremity Psychiatry: calm   Pressure Injury 03/24/23 Hip Anterior;Left;Proximal Stage 3 -  Full thickness tissue loss. Subcutaneous fat may be visible but bone, tendon or muscle are NOT exposed. (Active)  03/24/23   Location: Hip  Location Orientation: Anterior;Left;Proximal  Staging: Stage 3 -  Full thickness tissue loss. Subcutaneous fat may be visible but bone, tendon or muscle are NOT exposed.  Wound Description (Comments):   Present on Admission: Yes  Diet Orders (From admission, onward)     Start     Ordered   03/30/23 1307  Diet  regular Room service appropriate? Yes; Fluid consistency: Thin  Diet effective now       Question Answer Comment  Room service appropriate? Yes   Fluid consistency: Thin      03/30/23 1306            Objective: Vitals:   03/31/23 1707 03/31/23 1957 04/01/23 0511 04/01/23 0733  BP: 94/62 122/60 (!) 108/54 116/62  Pulse: 91 93 70 70  Resp: 16 17 19 17   Temp: 98.8 F (37.1 C) 98.7 F (37.1 C) 99.2 F (37.3 C) 98 F (36.7 C)  TempSrc:  Oral    SpO2: (!) 85% 93% 94% 97%  Weight:      Height:        Intake/Output Summary (Last 24 hours) at 04/01/2023 1404 Last data filed at 04/01/2023 1217 Gross per 24 hour  Intake 240 ml  Output 1600 ml  Net -1360 ml   Filed Weights   03/23/23 1641  Weight: 60.6 kg    Scheduled Meds:  amLODipine  5 mg Oral Daily   ascorbic acid  500 mg Oral Daily   atorvastatin  80 mg Oral QHS   docusate sodium  100 mg Oral Daily   enoxaparin (LOVENOX) injection  40 mg Subcutaneous Q24H   gabapentin  300 mg Oral TID   insulin aspart  0-5 Units Subcutaneous QHS   insulin aspart  0-9 Units Subcutaneous TID WC   insulin aspart  3 Units Subcutaneous TID WC   insulin glargine-yfgn  5 Units Subcutaneous q AM   leptospermum manuka honey  1 Application Topical Daily   pantoprazole  40 mg Oral BID   zinc sulfate  220 mg Oral Daily   Continuous Infusions:  Nutritional status Signs/Symptoms: estimated needs Interventions: Magic cup, Carnation Instant Breakfast, Refer to RD note for recommendations Body mass index is 24.44 kg/m.  Data Reviewed:   CBC: Recent Labs  Lab 03/28/23 0625 03/29/23 0326 03/30/23 1527 03/31/23 0831 04/01/23 0753  WBC 7.6 8.9 10.2 9.4 8.6  HGB 8.6* 8.6* 8.0* 7.0* 7.2*  HCT 27.3* 27.5* 25.9* 22.7* 23.3*  MCV 77.3* 78.6* 76.6* 76.2* 76.6*  PLT 209 319 292 268 297   Basic Metabolic Panel: Recent Labs  Lab 03/26/23 0418 03/27/23 0242 03/28/23 0625 03/29/23 0326 03/30/23 1527 03/31/23 0831 04/01/23 0753  NA 137  137 139 135 134* 136 134*  K 3.9 3.5 3.6 3.3* 4.0 4.5 4.1  CL 107 105 104 104 102 105 104  CO2 23 24 24 24 22  21* 25  GLUCOSE 224* 170* 203* 153* 252* 174* 206*  BUN 11 7* 7* 6* 12 13 15   CREATININE 1.06* 0.62 0.63 0.67 0.79 0.75 0.80  CALCIUM 8.9 9.1 8.6* 8.8* 9.2 8.6* 8.6*  MG 1.7 1.6* 2.0 1.9 1.7 1.8 2.0  PHOS 2.3* 2.3*  --   --   --   --   --    GFR: Estimated Creatinine Clearance: 51.3 mL/min (by C-G formula based on SCr of 0.8 mg/dL). Liver Function Tests: Recent Labs  Lab 03/26/23 0418  ALBUMIN 2.3*   No results for input(s): "LIPASE", "AMYLASE" in the last 168 hours. Recent Labs  Lab 03/25/23 1524  AMMONIA 31   Coagulation Profile: No results for input(s): "INR", "PROTIME" in the last 168 hours. Cardiac Enzymes: No results for input(s): "CKTOTAL", "CKMB", "CKMBINDEX", "TROPONINI" in the last 168 hours.  BNP (last 3 results) No results for input(s): "PROBNP" in the last 8760 hours. HbA1C: No results for input(s): "HGBA1C" in the last 72 hours. CBG: Recent Labs  Lab 03/31/23 1706 03/31/23 2156 04/01/23 0645 04/01/23 0750 04/01/23 1213  GLUCAP 112* 167* 204* 191* 269*   Lipid Profile: No results for input(s): "CHOL", "HDL", "LDLCALC", "TRIG", "CHOLHDL", "LDLDIRECT" in the last 72 hours. Thyroid Function Tests: No results for input(s): "TSH", "T4TOTAL", "FREET4", "T3FREE", "THYROIDAB" in the last 72 hours. Anemia Panel: No results for input(s): "VITAMINB12", "FOLATE", "FERRITIN", "TIBC", "IRON", "RETICCTPCT" in the last 72 hours. Sepsis Labs: No results for input(s): "PROCALCITON", "LATICACIDVEN" in the last 168 hours.  Recent Results (from the past 240 hour(s))  Blood Cultures x 2 sites     Status: None   Collection Time: 03/23/23  8:21 PM   Specimen: BLOOD  Result Value Ref Range Status   Specimen Description   Final    BLOOD SITE NOT SPECIFIED Performed at [facility], [address], [place], [place] [address]    Special Requests    Final    BOTTLES DRAWN AEROBIC AND ANAEROBIC Blood Culture adequate volume Performed at [facility], [address], [place], [place] [address]    Culture   Final    NO GROWTH 5 DAYS Performed at [facility], [address], [place], [place] [address]    Report Status 03/29/2023 FINAL  Final  Blood Cultures x 2 sites     Status: None   Collection Time: 03/24/23 12:13 AM   Specimen: BLOOD RIGHT ARM  Result Value Ref Range Status   Specimen Description   Final    BLOOD RIGHT ARM Performed at [facility], [address] [place], [place] [address]    Special Requests   Final    BOTTLES DRAWN AEROBIC ONLY Blood Culture results may not be optimal due to an inadequate volume of blood received in culture bottles Performed at [facility], [address], [place], [place] [address]    Culture   Final    NO GROWTH 5 DAYS Performed at [facility], [address], [place], [place] [address]    Report Status 03/29/2023 FINAL  Final         Radiology Studies: No results found.         LOS: 9 days   Time spent= 25 mins    Silvano Bilis, MD [facility]  If 7PM-7AM, please contact night-coverage  04/01/2023, 2:04 PM

## 2023-04-01 NOTE — Progress Notes (Signed)
  Progress Note    04/01/2023 7:51 AM 2 Days Post-Op  Subjective:  laying in bed sleeping    Vitals:   04/01/23 0511 04/01/23 0733  BP: (!) 108/54 116/62  Pulse: 70 70  Resp: 19 17  Temp: 99.2 F (37.3 C) 98 F (36.7 C)  SpO2: 94% 97%    Physical Exam: General:  no acute distress, sleeping Cardiac:  regular Lungs:  nonlabored Extremities:  right AKA c/d/l with dressing   CBC    Component Value Date/Time   WBC 9.4 03/31/2023 0831   RBC 2.98 (L) 03/31/2023 0831   HGB 7.0 (L) 03/31/2023 0831   HGB 11.1 (L) 11/11/2022 0809   HCT 22.7 (L) 03/31/2023 0831   PLT 268 03/31/2023 0831   PLT 296 11/11/2022 0809   MCV 76.2 (L) 03/31/2023 0831   MCH 23.5 (L) 03/31/2023 0831   MCHC 30.8 03/31/2023 0831   RDW 17.1 (H) 03/31/2023 0831   LYMPHSABS 1.1 03/24/2023 0357   MONOABS 0.5 03/24/2023 0357   EOSABS 0.4 03/24/2023 0357   BASOSABS 0.0 03/24/2023 0357    BMET    Component Value Date/Time   NA 136 03/31/2023 0831   K 4.5 03/31/2023 0831   CL 105 03/31/2023 0831   CO2 21 (L) 03/31/2023 0831   GLUCOSE 174 (H) 03/31/2023 0831   BUN 13 03/31/2023 0831   CREATININE 0.75 03/31/2023 0831   CREATININE 1.03 (H) 11/11/2022 0809   CALCIUM 8.6 (L) 03/31/2023 0831   GFRNONAA >60 03/31/2023 0831   GFRNONAA 56 (L) 11/11/2022 0809   GFRAA >60 01/16/2019 0714    INR    Component Value Date/Time   INR 1.1 09/22/2022 1750     Intake/Output Summary (Last 24 hours) at 04/01/2023 0751 Last data filed at 03/31/2023 2302 Gross per 24 hour  Intake --  Output 1000 ml  Net -1000 ml      Assessment/Plan:  77 y.o. female is 2 days post op, s/p: right AKA    -No acute distress this morning. Pain is well controlled -Right AKA dressing intact and dry this morning -Plan for dressing takedown tomorrow   Loel Dubonnet, PA-C [facility] [phone] 04/01/2023 7:51 AM

## 2023-04-01 NOTE — Progress Notes (Signed)
Nutrition Follow-up  DOCUMENTATION CODES:   Non-severe (moderate) malnutrition in context of chronic illness  INTERVENTION:  Dysphagia 3 diet for ease of chewing; automatic trays Feeding with assistance Carnation Breakfast Essentials TID, each packet mixed with 8 ounces of 2% milk provides 13 grams of protein and 260 calories. Magic cup TID with meals, each supplement provides 290 kcal and 9 grams of protein  NUTRITION DIAGNOSIS:   Moderate Malnutrition related to chronic illness (dementia, stage 4 NSCLC, CVA, PAD) as evidenced by moderate fat depletion, moderate muscle depletion, severe muscle depletion. - diagnosis updated 9/5  GOAL:   Patient will meet greater than or equal to 90% of their needs - goal unmet  MONITOR:   PO intake, Supplement acceptance, Labs, Weight trends, Skin  REASON FOR ASSESSMENT:   Malnutrition Screening Tool    ASSESSMENT:   Pt admitted with osteomyelitis of R foot. PMH significant for dementia, stage IV NSCLC on palliative radiation, CVA with L sided hemiparesis and contracture, PAD, ILD, COPD, gastric AVM, DM2 with R DM foot ulcer.  9/3: s/p R AKA  RN present at time of visit. Pt awake and smiling. Provides answers via head nods, though limited answers. RN reports that she really enjoys the carnation instant breakfast. Is uncertain whether pt is consuming magic cups. Her daughter was present this morning to assist with feeding breakfast meal. Pt consumed about 80% of toast and about 80% of eggs.   Meal completions: 8/31: 70% lunch, 50% dinner 9/1: 75% breakfast 9/2: 25% breakfast, 50$ lunch, 50% dinner 9/3: 0% (NPO for surgery) 9/4: RN reports pt didn't eat much this date d/t remaining drowsy 9/5: 75% breakfast  No updated weight on file to review.   Medications: Vitamin C 500mg  daily, colace, SSIi 0-15 units TID, SSI 0-5 units at bedtime, SSI 3 units TID, semglee 8 units daily, medihoney, protonix, zinc 220mg  daily (started 8/30)  Labs:  sodium 134, CBG's 191-269 x24 hours  NUTRITION - FOCUSED PHYSICAL EXAM:  Flowsheet Row Most Recent Value  Orbital Region Severe depletion  Upper Arm Region Moderate depletion  Thoracic and Lumbar Region No depletion  Buccal Region Moderate depletion  Temple Region Severe depletion  Clavicle Bone Region Mild depletion  Clavicle and Acromion Bone Region No depletion  Scapular Bone Region No depletion  Dorsal Hand Mild depletion  Patellar Region Moderate depletion  Anterior Thigh Region Moderate depletion  Posterior Calf Region Severe depletion  [L leg,  R AKA]  Edema (RD Assessment) None  Hair Reviewed  Eyes Reviewed  Mouth Other (Comment)  [edentulous]  Skin Reviewed  Nails Reviewed       Diet Order:   Diet Order             DIET DYS 3 Room service appropriate? Yes; Fluid consistency: Thin  Diet effective now                   EDUCATION NEEDS:   No education needs have been identified at this time  Skin:  Skin Assessment: Skin Integrity Issues: Skin Integrity Issues:: Stage III, Incisions Stage III: L hip Unstageable: R foot Incisions: R foot closed incision  Last BM:  9/1  Height:   Ht Readings from Last 1 Encounters:  03/23/23 5\' 2"  (1.575 m)    Weight:   Wt Readings from Last 1 Encounters:  03/23/23 60.6 kg   BMI:  Body mass index is 24.44 kg/m.  Estimated Nutritional Needs:   Kcal:  1500-1700  Protein:  75-100g  Fluid:  >/=1.5L   Drusilla Kanner, RDN, LDN Clinical Nutrition

## 2023-04-01 NOTE — Plan of Care (Signed)
  Problem: Education: Goal: Ability to describe self-care measures that may prevent or decrease complications (Diabetes Survival Skills Education) will improve Outcome: Progressing Goal: Individualized Educational Video(s) Outcome: Progressing   Problem: Coping: Goal: Ability to adjust to condition or change in health will improve Outcome: Progressing   Problem: Fluid Volume: Goal: Ability to maintain a balanced intake and output will improve Outcome: Progressing   Problem: Health Behavior/Discharge Planning: Goal: Ability to identify and utilize available resources and services will improve Outcome: Progressing Goal: Ability to manage health-related needs will improve Outcome: Progressing   Problem: Safety: Goal: Ability to remain free from injury will improve Outcome: Progressing   Problem: Skin Integrity: Goal: Risk for impaired skin integrity will decrease Outcome: Progressing

## 2023-04-02 DIAGNOSIS — M86171 Other acute osteomyelitis, right ankle and foot: Secondary | ICD-10-CM | POA: Diagnosis not present

## 2023-04-02 LAB — CBC
HCT: 24.2 % — ABNORMAL LOW (ref 36.0–46.0)
Hemoglobin: 7.4 g/dL — ABNORMAL LOW (ref 12.0–15.0)
MCH: 23.4 pg — ABNORMAL LOW (ref 26.0–34.0)
MCHC: 30.6 g/dL (ref 30.0–36.0)
MCV: 76.6 fL — ABNORMAL LOW (ref 80.0–100.0)
Platelets: 331 10*3/uL (ref 150–400)
RBC: 3.16 MIL/uL — ABNORMAL LOW (ref 3.87–5.11)
RDW: 16.9 % — ABNORMAL HIGH (ref 11.5–15.5)
WBC: 7.8 10*3/uL (ref 4.0–10.5)
nRBC: 0 % (ref 0.0–0.2)

## 2023-04-02 LAB — GLUCOSE, CAPILLARY
Glucose-Capillary: 193 mg/dL — ABNORMAL HIGH (ref 70–99)
Glucose-Capillary: 208 mg/dL — ABNORMAL HIGH (ref 70–99)

## 2023-04-02 MED ORDER — OXYCODONE HCL 5 MG PO TABS: 5.0000 mg | ORAL_TABLET | Freq: Four times a day (QID) | ORAL | 0 refills | Status: AC | PRN

## 2023-04-02 MED ORDER — DOCUSATE SODIUM 100 MG PO CAPS: 100.0000 mg | ORAL_CAPSULE | Freq: Every day | ORAL | 0 refills | Status: DC

## 2023-04-02 MED ORDER — "MEPILEX AG 4""X4"" EX PADS": 1.0000 | MEDICATED_PAD | Freq: Every day | CUTANEOUS | 3 refills | Status: AC

## 2023-04-02 NOTE — TOC Transition Note (Signed)
Transition of Care [facility]) - CM/SW Discharge Note   Patient Details  Name: Crystal Moore MRN: [id number] Date of Birth: [birth year]  [facility] CM/SW Contact:  Gordy Clement, RN Phone Number: 04/02/2023, 12:24 PM   Clinical Narrative:     Patient will DC to home today   Daughter is coming to transport.  States there is a wheelchair at home that she will use to get her Mom into house.  HH has been ordered.  Patient has used [facility] in the past.  Waiting to see if they will accept. If not, CM will find another agency to provide SN. Patient can still DC and CM will call with [facility] details . A hospital bed has been ordered and will be delivered to home from [employer]. This is a replacement bed so will not hold up DC either. Patient can use bed they have until new one is delivered- per Daughter.   No additional TOC needs      Barriers to Discharge: Continued Medical Work up   Patient Goals and CMS Choice      Discharge Placement                         Discharge Plan and Services Additional resources added to the After Visit Summary for   In-house Referral: Clinical Social Work                                   Social Determinants of Health (SDOH) Interventions SDOH Screenings   Food Insecurity: Patient Unable To Answer (03/23/2023)  Housing: High Risk (03/23/2023)  Transportation Needs: Patient Unable To Answer (03/23/2023)  Utilities: Patient Unable To Answer (03/23/2023)  Tobacco Use: Medium Risk (03/30/2023)     Readmission Risk Interventions    03/25/2023    1:00 PM 09/18/2020   12:44 PM  Readmission Risk Prevention Plan  Transportation Screening Complete Complete  PCP or Specialist Appt within 3-5 Days  Complete  HRI or Home Care Consult Complete   Social Work Consult for Recovery Care Planning/Counseling Complete Complete  Palliative Care Screening Complete Not Applicable  Medication Review [employer]) Complete Complete

## 2023-04-02 NOTE — Progress Notes (Signed)
Patient discharged,discharge instructions given and explained to the patients daughter and she has no additional questions and concerns at this time.Iv removed per protocol and the guardian instructed on wound care and supplies given.Patient transported via wheelchair  safely from the unit to the car.

## 2023-04-02 NOTE — Plan of Care (Signed)
  Problem: Education: Goal: Ability to describe self-care measures that may prevent or decrease complications (Diabetes Survival Skills Education) will improve Outcome: Adequate for Discharge Goal: Individualized Educational Video(s) Outcome: Adequate for Discharge   Problem: Coping: Goal: Ability to adjust to condition or change in health will improve Outcome: Adequate for Discharge   Problem: Fluid Volume: Goal: Ability to maintain a balanced intake and output will improve Outcome: Adequate for Discharge   Problem: Health Behavior/Discharge Planning: Goal: Ability to identify and utilize available resources and services will improve Outcome: Adequate for Discharge Goal: Ability to manage health-related needs will improve Outcome: Adequate for Discharge   Problem: Metabolic: Goal: Ability to maintain appropriate glucose levels will improve Outcome: Adequate for Discharge   Problem: Nutritional: Goal: Maintenance of adequate nutrition will improve Outcome: Adequate for Discharge Goal: Progress toward achieving an optimal weight will improve Outcome: Adequate for Discharge   Problem: Skin Integrity: Goal: Risk for impaired skin integrity will decrease Outcome: Adequate for Discharge   Problem: Tissue Perfusion: Goal: Adequacy of tissue perfusion will improve Outcome: Adequate for Discharge   Problem: Education: Goal: Knowledge of General Education information will improve Description: Including pain rating scale, medication(s)/side effects and non-pharmacologic comfort measures Outcome: Adequate for Discharge   Problem: Health Behavior/Discharge Planning: Goal: Ability to manage health-related needs will improve Outcome: Adequate for Discharge   Problem: Clinical Measurements: Goal: Ability to maintain clinical measurements within normal limits will improve Outcome: Adequate for Discharge Goal: Will remain free from infection Outcome: Adequate for Discharge Goal:  Diagnostic test results will improve Outcome: Adequate for Discharge Goal: Respiratory complications will improve Outcome: Adequate for Discharge Goal: Cardiovascular complication will be avoided Outcome: Adequate for Discharge   Problem: Nutrition: Goal: Adequate nutrition will be maintained Outcome: Adequate for Discharge   Problem: Coping: Goal: Level of anxiety will decrease Outcome: Adequate for Discharge   Problem: Elimination: Goal: Will not experience complications related to bowel motility Outcome: Adequate for Discharge   Problem: Pain Managment: Goal: General experience of comfort will improve Outcome: Adequate for Discharge   Problem: Safety: Goal: Ability to remain free from injury will improve Outcome: Adequate for Discharge   Problem: Skin Integrity: Goal: Risk for impaired skin integrity will decrease Outcome: Adequate for Discharge   Problem: Education: Goal: Knowledge of the prescribed therapeutic regimen will improve Outcome: Adequate for Discharge Goal: Ability to verbalize activity precautions or restrictions will improve Outcome: Adequate for Discharge Goal: Understanding of discharge needs will improve Outcome: Adequate for Discharge   Problem: Activity: Goal: Ability to perform//tolerate increased activity and mobilize with assistive devices will improve Outcome: Adequate for Discharge   Problem: Clinical Measurements: Goal: Postoperative complications will be avoided or minimized Outcome: Adequate for Discharge   Problem: Self-Care: Goal: Ability to meet self-care needs will improve Outcome: Adequate for Discharge   Problem: Self-Concept: Goal: Ability to maintain and perform role responsibilities to the fullest extent possible will improve Outcome: Adequate for Discharge   Problem: Pain Management: Goal: Pain level will decrease with appropriate interventions Outcome: Adequate for Discharge   Problem: Skin Integrity: Goal:  Demonstration of wound healing without infection will improve Outcome: Adequate for Discharge

## 2023-04-02 NOTE — Progress Notes (Addendum)
  Progress Note    04/02/2023 7:37 AM 3 Days Post-Op  Subjective:  laying in bed, no acute distress    Vitals:   04/01/23 1934 04/02/23 0455  BP: (!) 130/59 122/74  Pulse: 89 74  Resp: 16 14  Temp:    SpO2: 97% 96%    Physical Exam: General:  laying in bed  Lungs:  nonlabored Incisions:  right AKA incision well appearing and dry. Dressing removed    CBC    Component Value Date/Time   WBC 8.6 04/01/2023 0753   RBC 3.04 (L) 04/01/2023 0753   HGB 7.2 (L) 04/01/2023 0753   HGB 11.1 (L) 11/11/2022 0809   HCT 23.3 (L) 04/01/2023 0753   PLT 297 04/01/2023 0753   PLT 296 11/11/2022 0809   MCV 76.6 (L) 04/01/2023 0753   MCH 23.7 (L) 04/01/2023 0753   MCHC 30.9 04/01/2023 0753   RDW 16.9 (H) 04/01/2023 0753   LYMPHSABS 1.1 03/24/2023 0357   MONOABS 0.5 03/24/2023 0357   EOSABS 0.4 03/24/2023 0357   BASOSABS 0.0 03/24/2023 0357    BMET    Component Value Date/Time   NA 134 (L) 04/01/2023 0753   K 4.1 04/01/2023 0753   CL 104 04/01/2023 0753   CO2 25 04/01/2023 0753   GLUCOSE 206 (H) 04/01/2023 0753   BUN 15 04/01/2023 0753   CREATININE 0.80 04/01/2023 0753   CREATININE 1.03 (H) 11/11/2022 0809   CALCIUM 8.6 (L) 04/01/2023 0753   GFRNONAA >60 04/01/2023 0753   GFRNONAA 56 (L) 11/11/2022 0809   GFRAA >60 01/16/2019 0714    INR    Component Value Date/Time   INR 1.1 09/22/2022 1750     Intake/Output Summary (Last 24 hours) at 04/02/2023 0737 Last data filed at 04/02/2023 0530 Gross per 24 hour  Intake 240 ml  Output 1600 ml  Net -1360 ml      Assessment/Plan:  77 y.o. female is 3 days post op, s/p: right AKA   -Right AKA dressing taken down this morning. Incision site is well appearing and dry -Will require daily occlusive dressings with gauze and tegaderm to ensure patient's bowel movements do not harm the amputation site -Will arrange follow up with our office in 4-5 weeks for staple removal   Loel Dubonnet, PA-C [facility] [phone] 04/02/2023 7:37 AM  VASCULAR STAFF ADDENDUM: I have independently interviewed and examined the patient. I agree with the above.  Occlusive dressing. Stump sock. Healing appropriately.   Victorino Sparrow MD [facility] Phone Number: [phone] 04/02/2023 8:15 AM

## 2023-04-02 NOTE — Discharge Summary (Signed)
Physician Discharge Summary  ATLANTA JANES HYQ:[id number] DOB: 1945/10/06 DOA: 03/23/2023  PCP: Lorenda Ishihara, MD  Admit date: 03/23/2023 Discharge date: 04/02/2023  Admitted From: Home Disposition: Home  Recommendations for Outpatient Follow-up:  Follow up with PCP in 1-2 weeks Surgery will schedule follow-up  Home Health: N/A Equipment/Devices: Hospital bed  Discharge Condition: Fair CODE STATUS: Full code Diet recommendation: Low-salt diet, low-carb diet, nutritional supplements  Discharge summary: 77 year old with history of dementia, stage IV NSCLC on palliative radiation, CVA with left-sided hemiparesis and contracture, PAD, ILD with hypoxia 2 L nasal cannula, COPD, gastric AVM, DM2 with right diabetic foot ulcer sent to the hospital by PCP due to concerns of osteomyelitis.  Upon admission MRI confirmed osteomyelitis of her foot, orthopedic was consulted who recommended also consulting vascular due to concerns of significant PAD.  Patient was transferred to [facility] for vascular surgery evaluation.  After discussions with palliative care team, patient eventually underwent right sided AKA on 9/3. Overall in poor clinical status, however improved after above-knee amputation and tolerated procedure well.  Able to go back home with family.  She is total assist and bedbound.  Treated for following conditions.   # Acute osteomyelitis of foot, right, status post right AKA 9/3:  Noted to have osteomyelitis of the right foot on the MRI.  Orthopedic, vascular and palliative care team was consulted and eventually underwent right-sided AKA. Now off abx.  Seen by surgery.  Dressing removed.  Local occlusive dressing to apply to prevent from swelling. Currently not needing a lot of pain medications.  She will be prescribed a short course of oxycodone to be used for any pain.  She will also be prescribed laxative regimen with it.   Acute toxic encephalopathy: resolved.  Received  Ativan and Dilaudid prior to MRI.  Avoid sedating meds.  CT of the head is negative.  Supportive care.  Improved as per family.   Chronic ILD/chronic hypoxic RF:  Continue 2 L nasal cannula.  Stable at home doses.   IDDM-2 with hyperglycemia and diabetic wound with osteomyelitis:  Resume home medications.  She is on long-acting insulin.     History of CVA with residual left-sided weakness and spasticity -Continue home meds, resume Plavix today.  -Patient is bedbound.  Not a rehab candidate.  She will benefit with hospital bed due to complex wound care needs.  Prescribed.   Essential hypertension:  Stable.  Resume home amlodipine.   Hyperlipidemia -Continue home Lipitor   Dementia without behavioral disturbance: Seems advanced.  Awake and alert but only oriented to self. -Reorientation and delirium precaution.  Going home.   Pressure Injury 03/24/23 Hip Anterior;Left;Proximal Stage 3 -  Full thickness tissue loss. Subcutaneous fat may be visible but bone, tendon or muscle are NOT exposed. (Active)  03/24/23   Location: Hip  Location Orientation: Anterior;Left;Proximal  Staging: Stage 3 -  Full thickness tissue loss. Subcutaneous fat may be visible but bone, tendon or muscle are NOT exposed.  Wound Description (Comments):   Present on Admission: Yes   Stable to transition home with family.    Discharge Diagnoses:  Principal Problem:   Osteomyelitis of foot, right, acute (HCC) Active Problems:   COPD (chronic obstructive pulmonary disease) (HCC)   Chronic respiratory failure with hypoxia (HCC)   Depression   COPD (chronic obstructive pulmonary disease) (HCC)   Protein-calorie malnutrition, moderate (HCC)   CVA (cerebral vascular accident) (HCC)   Dementia without behavioral disturbance (HCC)   History of CVA (cerebrovascular  accident)   Pressure injury of skin    Discharge Instructions  Discharge Instructions     Diet - low sodium heart healthy   Complete by: As  directed    Discharge wound care:   Complete by: As directed    Right Leg  Please perform daily occlusive dressing changes to the right AKA site with gauze and tegaderm   Left leg  Wound care  Daily      Cleanse wounds to right foot and heel, right hip with NS and pat dry.  Apply foam dressing to right hip and right heel.  For debridement:  Apply medihoney to right medial foot, left lateral malleolus and right hip.  Cover with gauze dressing and secure with foam.  Change daily.   Increase activity slowly   Complete by: As directed       Allergies as of 04/02/2023       Reactions   Aspirin Other (See Comments)   "Cannot take due to previous GI bleed," per daughter        Medication List     STOP taking these medications    FeroSul 325 (65 Fe) MG tablet Generic drug: ferrous sulfate   folic acid 1 MG tablet Commonly known as: FOLVITE   ipratropium 0.02 % nebulizer solution Commonly known as: ATROVENT   levalbuterol 0.63 MG/3ML nebulizer solution Commonly known as: XOPENEX       TAKE these medications    Accu-Chek Guide test strip Generic drug: glucose blood USE TO test AS DIRECTED TWICE DAILY What changed: See the new instructions.   Accu-Chek Softclix Lancets lancets 1 each by Other route daily.   acetaminophen 500 MG tablet Commonly known as: TYLENOL Take 1,000 mg by mouth every 6 (six) hours as needed (pain).   albuterol 108 (90 Base) MCG/ACT inhaler Commonly known as: VENTOLIN HFA Inhale 2 puffs into the lungs every 6 (six) hours as needed for wheezing or shortness of breath. What changed: Another medication with the same name was removed. Continue taking this medication, and follow the directions you see here.   amLODipine 5 MG tablet Commonly known as: NORVASC Take 5 mg by mouth in the morning.   atorvastatin 80 MG tablet Commonly known as: LIPITOR Take 1 tablet (80 mg total) by mouth daily at 6 PM. What changed: when to take this    clopidogrel 75 MG tablet Commonly known as: PLAVIX Take 1 tablet (75 mg total) by mouth daily.   docusate sodium 100 MG capsule Commonly known as: COLACE Take 1 capsule (100 mg total) by mouth daily. Start taking on: April 03, 2023   feeding supplement Liqd Take 237 mLs by mouth 2 (two) times daily between meals.   gabapentin 300 MG capsule Commonly known as: NEURONTIN Take 1 capsule (300 mg total) by mouth 3 (three) times daily.   insulin glargine 100 UNIT/ML injection Commonly known as: LANTUS Inject 0.1 mLs (10 Units total) into the skin daily. What changed: when to take this   linagliptin 5 MG Tabs tablet Commonly known as: TRADJENTA Take 5 mg by mouth daily.   Mepilex AG 4"X4" Pads Apply 1 each topically daily.   oxyCODONE 5 MG immediate release tablet Commonly known as: Oxy IR/ROXICODONE Take 1 tablet (5 mg total) by mouth every 6 (six) hours as needed for up to 5 days for moderate pain or severe pain.   pantoprazole 40 MG tablet Commonly known as: PROTONIX Take 40 mg by mouth 2 (two) times daily.  Santyl 250 UNIT/GM ointment Generic drug: collagenase Apply 1 Application topically See admin instructions. Apply as directed to affected areas of both feet daily as directed               Durable Medical Equipment  (From admission, onward)           Start     Ordered   04/02/23 1140  For home use only DME Hospital bed  Once       Question Answer Comment  Length of Need Lifetime   Patient has (list medical condition): Dementia, pressure ulcers.  Right above-knee amputation.   The above medical condition requires: Patient requires the ability to reposition frequently   Head must be elevated greater than: 30 degrees   Bed type Semi-electric   Support Surface: Low Air loss Mattress      04/02/23 1139              Discharge Care Instructions  (From admission, onward)           Start     Ordered   04/02/23 0000  Discharge wound care:        Comments: Right Leg  Please perform daily occlusive dressing changes to the right AKA site with gauze and tegaderm   Left leg  Wound care  Daily      Cleanse wounds to right foot and heel, right hip with NS and pat dry.  Apply foam dressing to right hip and right heel.  For debridement:  Apply medihoney to right medial foot, left lateral malleolus and right hip.  Cover with gauze dressing and secure with foam.  Change daily.   04/02/23 1151            Follow-up Information     [facility] Follow up in 4 week(s).   Specialty: Vascular Surgery Why: Office will call to arrange your appt(s) (sent) Contact information: [address] [place] [address] [phone]               Allergies  Allergen Reactions   Aspirin Other (See Comments)    "Cannot take due to previous GI bleed," per daughter      Consultations: Orthopedics Vascular surgery   Procedures/Studies: VAS [place] ABI WITH/WO TBI  Result Date: 03/26/2023  LOWER EXTREMITY DOPPLER STUDY Patient Name:  BIANCE DIERKER  Date of Exam:   03/25/2023 Medical Rec #: [id number]        Accession #:    [id number] Date of Birth: 01-Aug-1945         Patient Gender: F Patient Age:   57 years Exam Location:  [facility] Procedure:      VAS [place] ABI WITH/WO TBI Referring Phys: Alwyn Ren GONFA --------------------------------------------------------------------------------  Indications: Peripheral artery disease. High Risk Factors: Diabetes.  Limitations: Today's exam was limited due to bandages , an open wound, patient              positioning and patient immobility, left arm contracture. Comparison Study: 03/10/2022 - Right: Resting right ankle-brachial index                   indicates severe right lower                   extremity arterial disease.                    Left: Resting left ankle-brachial index indicates moderate  left lower                    extremity arterial disease. Performing Technologist: Olen Cordial RVT  Examination Guidelines: A complete evaluation includes at minimum, Doppler waveform signals and systolic blood pressure reading at the level of bilateral brachial, anterior tibial, and posterior tibial arteries, when vessel segments are accessible. Bilateral testing is considered an integral part of a complete examination. Photoelectric Plethysmograph (PPG) waveforms and toe systolic pressure readings are included as required and additional duplex testing as needed. Limited examinations for reoccurring indications may be performed as noted.  ABI Findings: +---------+------------------+-----+----------+------------------+ Right    Rt Pressure (mmHg)IndexWaveform  Comment            +---------+------------------+-----+----------+------------------+ Brachial 140                    triphasic                    +---------+------------------+-----+----------+------------------+ PTA                                       Open wound/bandage +---------+------------------+-----+----------+------------------+ DP       56                0.40 monophasic                   +---------+------------------+-----+----------+------------------+ Great Toe                                 Open wound/bandage +---------+------------------+-----+----------+------------------+ +---------+------------------+-----+----------+-----------+ Left     Lt Pressure (mmHg)IndexWaveform  Comment     +---------+------------------+-----+----------+-----------+ Brachial                                  Contracture +---------+------------------+-----+----------+-----------+ PTA      70                0.50 monophasic            +---------+------------------+-----+----------+-----------+ DP       63                0.45 monophasic            +---------+------------------+-----+----------+-----------+ Great Toe33                0.24                        +---------+------------------+-----+----------+-----------+ +-------+-----------+-----------+------------+------------+ ABI/TBIToday's ABIToday's TBIPrevious ABIPrevious TBI +-------+-----------+-----------+------------+------------+ Right  0.4                                            +-------+-----------+-----------+------------+------------+ Left   0.5        0.24                                +-------+-----------+-----------+------------+------------+  Summary: Right: Resting right ankle-brachial index indicates severe right lower extremity arterial disease. Unable to obtain TBI due to great toe wound. Left: Resting left ankle-brachial index indicates moderate left lower extremity arterial disease. The left toe-brachial index is abnormal. *See  table(s) above for measurements and observations.  Electronically signed by Gerarda Fraction on 03/26/2023 at 9:21:15 AM.    Final    CT HEAD WO CONTRAST ( )  Result Date: 03/25/2023 CLINICAL DATA:  Altered mental status EXAM: CT HEAD WITHOUT CONTRAST TECHNIQUE: Contiguous axial images were obtained from the base of the skull through the vertex without intravenous contrast. RADIATION DOSE REDUCTION: This exam was performed according to the departmental dose-optimization program which includes automated exposure control, adjustment of the mA and/or kV according to patient size and/or use of iterative reconstruction technique. COMPARISON:  None Available. FINDINGS: Brain: There is no mass, hemorrhage or extra-axial collection. There is generalized atrophy without lobar predilection. Hypodensity of the white matter is most commonly associated with chronic microvascular disease. Old small vessel infarct of the right cerebellum. Old right frontal infarct. Vascular: Atherosclerotic calcification of the vertebral and internal carotid arteries at the skull base. No abnormal hyperdensity of the major intracranial arteries or dural venous  sinuses. Skull: The visualized skull base, calvarium and extracranial soft tissues are normal. Sinuses/Orbits: No fluid levels or advanced mucosal thickening of the visualized paranasal sinuses. No mastoid or middle ear effusion. The orbits are normal. IMPRESSION: 1. No acute intracranial abnormality. 2. Advanced atrophy and chronic microvascular ischemia. 3. Old right frontal and right cerebellar infarcts. Electronically Signed   By: Deatra Robinson M.D.   On: 03/25/2023 22:48   MR FOOT RIGHT W WO CONTRAST  Result Date: 03/25/2023 CLINICAL DATA:  Right foot wound with osteomyelitis. EXAM: MRI OF THE RIGHT FOREFOOT WITHOUT AND WITH CONTRAST TECHNIQUE: Multiplanar, multisequence MR imaging of the right forefoot was performed before and after the administration of intravenous contrast. CONTRAST:  6mL GADAVIST GADOBUTROL 1 MMOL/ML IV SOLN COMPARISON:  Right foot x-rays dated March 23, 2023. FINDINGS: Bones/Joint/Cartilage Abnormal marrow edema with corresponding mildly decreased T1 marrow signal involving the first metatarsal head, medial and lateral hallux sesamoids, and base of the first proximal phalanx. Remaining marrow signal is normal. No fracture or dislocation. Joint spaces are preserved. No joint effusion. Ligaments Collateral ligaments are intact. Muscles and Tendons Flexor and extensor tendons are intact. No tenosynovitis. Increased T2 signal within and atrophy the intrinsic muscles of the forefoot, nonspecific, but likely related to diabetic muscle changes. Soft tissue Soft tissue ulceration along the medial aspect of the first metatarsal head. Surrounding soft tissue swelling extending into the dorsal foot. No fluid collection. No soft tissue mass. IMPRESSION: 1. Soft tissue ulceration along the medial aspect of the first metatarsal head with underlying osteomyelitis of the first metatarsal head, medial and lateral hallux sesamoids, and base of the first proximal phalanx. 2. No abscess. Electronically  Signed   By: Obie Dredge M.D.   On: 03/25/2023 08:17   DG Foot Complete Right  Result Date: 03/23/2023 CLINICAL DATA:  Right foot wound, osteomyelitis EXAM: RIGHT FOOT COMPLETE - 3+ VIEW COMPARISON:  03/23/2023 at 1:57 p.m. FINDINGS: Frontal, oblique, lateral views of the right foot are obtained. The soft tissue defect at the base of the first digit is again identified, with erosive changes of the first metatarsal head and base of the first proximal phalanx consistent with osteomyelitis. No change since exam performed earlier today. Bones are osteopenic. Multifocal osteoarthritis unchanged. Diffuse soft tissue swelling. IMPRESSION: 1. Osteomyelitis involving the head of the first metatarsal and base of the first proximal phalanx, with overlying soft tissue defect. No change since the exam performed earlier today. Electronically Signed   By: Maxwell Caul.D.  On: 03/23/2023 17:39   (Echo, Carotid, EGD, Colonoscopy, ERCP)    Subjective: Patient seen and examined.  Daughter at the bedside.  Patient is alert awake.  She is oriented to herself and her family.  Denies any complaints.  Denies any pain.  Daughter at the bedside stated patient at about her baseline regarding her mentation.  She wanted to require a hospital bed and this was prescribed.   Discharge Exam: Vitals:   04/01/23 1934 04/02/23 0455  BP: (!) 130/59 122/74  Pulse: 89 74  Resp: 16 14  Temp:    SpO2: 97% 96%   Vitals:   04/01/23 0733 04/01/23 1507 04/01/23 1934 04/02/23 0455  BP: 116/62 (!) 126/59 (!) 130/59 122/74  Pulse: 70 93 89 74  Resp: 17 16 16 14   Temp: 98 F (36.7 C) 98 F (36.7 C)    TempSrc:      SpO2: 97% 100% 97% 96%  Weight:      Height:        General: Pt is alert, awake, not in acute distress Chronically sick looking.  Frail and debilitated.  Looks comfortable on 2 L oxygen. Cardiovascular: RRR, S1/S2 +, no rubs, no gallops Respiratory: CTA bilaterally, no wheezing, no rhonchi, on 2 L oxygen.   Comfortably breathing. Abdominal: Soft, NT, ND, bowel sounds + Extremities:  Right above-knee amputation stump clean and dry, staples intact.  See picture in the chart. Contracted left extremities, multiple pressure ulcers on the hip and on her left leg.  Pictures in the chart.    The results of significant diagnostics from this hospitalization (including imaging, microbiology, ancillary and laboratory) are listed below for reference.     Microbiology: Recent Results (from the past 240 hour(s))  Blood Cultures x 2 sites     Status: None   Collection Time: 03/23/23  8:21 PM   Specimen: BLOOD  Result Value Ref Range Status   Specimen Description   Final    BLOOD SITE NOT SPECIFIED Performed at [facility], [address] [place], [place] [address]    Special Requests   Final    BOTTLES DRAWN AEROBIC AND ANAEROBIC Blood Culture adequate volume Performed at [facility], [address], [place], [place] [address]    Culture   Final    NO GROWTH 5 DAYS Performed at [facility], [address] [place], [place] [address]    Report Status 03/29/2023 FINAL  Final  Blood Cultures x 2 sites     Status: None   Collection Time: 03/24/23 12:13 AM   Specimen: BLOOD RIGHT ARM  Result Value Ref Range Status   Specimen Description   Final    BLOOD RIGHT ARM Performed at [facility], [address], [place], [place] [address]    Special Requests   Final    BOTTLES DRAWN AEROBIC ONLY Blood Culture results may not be optimal due to an inadequate volume of blood received in culture bottles Performed at [facility], [address] [place], [place] [address]    Culture   Final    NO GROWTH 5 DAYS Performed at [facility], [address], [place], [place] [address]    Report Status 03/29/2023 FINAL  Final     Labs: BNP (last 3 results) Recent Labs    09/23/22 0400  BNP 38.6   Basic Metabolic  Panel: Recent Labs  Lab 03/27/23 0242 03/28/23 0625 03/29/23 0326 03/30/23 1527  03/31/23 0831 04/01/23 0753  NA 137 139 135 134* 136 134*  K 3.5 3.6 3.3* 4.0 4.5 4.1  CL 105 104 104 102 105 104  CO2 24 24 24 22  21* 25  GLUCOSE 170* 203* 153* 252* 174* 206*  BUN 7* 7* 6* 12 13 15   CREATININE 0.62 0.63 0.67 0.79 0.75 0.80  CALCIUM 9.1 8.6* 8.8* 9.2 8.6* 8.6*  MG 1.6* 2.0 1.9 1.7 1.8 2.0  PHOS 2.3*  --   --   --   --   --    Liver Function Tests: No results for input(s): "AST", "ALT", "ALKPHOS", "BILITOT", "PROT", "ALBUMIN" in the last 168 hours. No results for input(s): "LIPASE", "AMYLASE" in the last 168 hours. No results for input(s): "AMMONIA" in the last 168 hours. CBC: Recent Labs  Lab 03/29/23 0326 03/30/23 1527 03/31/23 0831 04/01/23 0753 04/02/23 1009  WBC 8.9 10.2 9.4 8.6 7.8  HGB 8.6* 8.0* 7.0* 7.2* 7.4*  HCT 27.5* 25.9* 22.7* 23.3* 24.2*  MCV 78.6* 76.6* 76.2* 76.6* 76.6*  PLT 319 292 268 297 331   Cardiac Enzymes: No results for input(s): "CKTOTAL", "CKMB", "CKMBINDEX", "TROPONINI" in the last 168 hours. BNP: Invalid input(s): "POCBNP" CBG: Recent Labs  Lab 04/01/23 1213 04/01/23 1610 04/01/23 2132 04/02/23 0759 04/02/23 1127  GLUCAP 269* 148* 185* 193* 208*   D-Dimer No results for input(s): "DDIMER" in the last 72 hours. Hgb A1c No results for input(s): "HGBA1C" in the last 72 hours. Lipid Profile No results for input(s): "CHOL", "HDL", "LDLCALC", "TRIG", "CHOLHDL", "LDLDIRECT" in the last 72 hours. Thyroid function studies No results for input(s): "TSH", "T4TOTAL", "T3FREE", "THYROIDAB" in the last 72 hours.  Invalid input(s): "FREET3" Anemia work up No results for input(s): "VITAMINB12", "FOLATE", "FERRITIN", "TIBC", "IRON", "RETICCTPCT" in the last 72 hours. Urinalysis    Component Value Date/Time   COLORURINE YELLOW 02/23/2023 0652   APPEARANCEUR CLEAR 02/23/2023 0652   LABSPEC 1.035 (H) 02/23/2023 0652   PHURINE 5.0 02/23/2023  0652   GLUCOSEU NEGATIVE 02/23/2023 0652   HGBUR NEGATIVE 02/23/2023 0652   BILIRUBINUR NEGATIVE 02/23/2023 0652   KETONESUR NEGATIVE 02/23/2023 0652   PROTEINUR NEGATIVE 02/23/2023 0652   NITRITE NEGATIVE 02/23/2023 0652   LEUKOCYTESUR NEGATIVE 02/23/2023 0652   Sepsis Labs Recent Labs  Lab 03/30/23 1527 03/31/23 0831 04/01/23 0753 04/02/23 1009  WBC 10.2 9.4 8.6 7.8   Microbiology Recent Results (from the past 240 hour(s))  Blood Cultures x 2 sites     Status: None   Collection Time: 03/23/23  8:21 PM   Specimen: BLOOD  Result Value Ref Range Status   Specimen Description   Final    BLOOD SITE NOT SPECIFIED Performed at [facility], [address], [place], [place] [address]    Special Requests   Final    BOTTLES DRAWN AEROBIC AND ANAEROBIC Blood Culture adequate volume Performed at [facility], [address], [place], [place] [address]    Culture   Final    NO GROWTH 5 DAYS Performed at [facility], [address] [place], [place] [address]    Report Status 03/29/2023 FINAL  Final  Blood Cultures x 2 sites     Status: None   Collection Time: 03/24/23 12:13 AM   Specimen: BLOOD RIGHT ARM  Result Value Ref Range Status   Specimen Description   Final    BLOOD RIGHT ARM Performed at [facility], [address] [place], [place] [address]  Special Requests   Final    BOTTLES DRAWN AEROBIC ONLY Blood Culture results may not be optimal due to an inadequate volume of blood received in culture bottles Performed at [facility], [address] [place], [place] [address]    Culture   Final    NO GROWTH 5 DAYS Performed at [facility], [address], [place], [place] [address]    Report Status 03/29/2023 FINAL  Final     Time coordinating discharge: 35 minutes  SIGNED:   Dorcas Carrow, MD  [facility] 04/02/2023, 11:51 AM

## 2023-04-04 DIAGNOSIS — E13621 Other specified diabetes mellitus with foot ulcer: Secondary | ICD-10-CM | POA: Diagnosis not present

## 2023-04-05 DIAGNOSIS — E1142 Type 2 diabetes mellitus with diabetic polyneuropathy: Secondary | ICD-10-CM | POA: Diagnosis not present

## 2023-04-05 DIAGNOSIS — E440 Moderate protein-calorie malnutrition: Secondary | ICD-10-CM | POA: Diagnosis not present

## 2023-04-05 DIAGNOSIS — K219 Gastro-esophageal reflux disease without esophagitis: Secondary | ICD-10-CM | POA: Diagnosis not present

## 2023-04-05 DIAGNOSIS — D509 Iron deficiency anemia, unspecified: Secondary | ICD-10-CM | POA: Diagnosis not present

## 2023-04-05 DIAGNOSIS — D630 Anemia in neoplastic disease: Secondary | ICD-10-CM | POA: Diagnosis not present

## 2023-04-05 DIAGNOSIS — I10 Essential (primary) hypertension: Secondary | ICD-10-CM | POA: Diagnosis not present

## 2023-04-05 DIAGNOSIS — J9611 Chronic respiratory failure with hypoxia: Secondary | ICD-10-CM | POA: Diagnosis not present

## 2023-04-05 DIAGNOSIS — L89522 Pressure ulcer of left ankle, stage 2: Secondary | ICD-10-CM | POA: Diagnosis not present

## 2023-04-05 DIAGNOSIS — J849 Interstitial pulmonary disease, unspecified: Secondary | ICD-10-CM | POA: Diagnosis not present

## 2023-04-05 DIAGNOSIS — J449 Chronic obstructive pulmonary disease, unspecified: Secondary | ICD-10-CM | POA: Diagnosis not present

## 2023-04-05 DIAGNOSIS — E785 Hyperlipidemia, unspecified: Secondary | ICD-10-CM | POA: Diagnosis not present

## 2023-04-05 DIAGNOSIS — C4492 Squamous cell carcinoma of skin, unspecified: Secondary | ICD-10-CM | POA: Diagnosis not present

## 2023-04-05 DIAGNOSIS — L89892 Pressure ulcer of other site, stage 2: Secondary | ICD-10-CM | POA: Diagnosis not present

## 2023-04-05 DIAGNOSIS — G319 Degenerative disease of nervous system, unspecified: Secondary | ICD-10-CM | POA: Diagnosis not present

## 2023-04-05 DIAGNOSIS — Z794 Long term (current) use of insulin: Secondary | ICD-10-CM | POA: Diagnosis not present

## 2023-04-05 DIAGNOSIS — Z89611 Acquired absence of right leg above knee: Secondary | ICD-10-CM | POA: Diagnosis not present

## 2023-04-05 DIAGNOSIS — E1151 Type 2 diabetes mellitus with diabetic peripheral angiopathy without gangrene: Secondary | ICD-10-CM | POA: Diagnosis not present

## 2023-04-05 DIAGNOSIS — C3490 Malignant neoplasm of unspecified part of unspecified bronchus or lung: Secondary | ICD-10-CM | POA: Diagnosis not present

## 2023-04-05 DIAGNOSIS — Z4781 Encounter for orthopedic aftercare following surgical amputation: Secondary | ICD-10-CM | POA: Diagnosis not present

## 2023-04-05 DIAGNOSIS — H903 Sensorineural hearing loss, bilateral: Secondary | ICD-10-CM | POA: Diagnosis not present

## 2023-04-05 DIAGNOSIS — Z7902 Long term (current) use of antithrombotics/antiplatelets: Secondary | ICD-10-CM | POA: Diagnosis not present

## 2023-04-05 DIAGNOSIS — I69354 Hemiplegia and hemiparesis following cerebral infarction affecting left non-dominant side: Secondary | ICD-10-CM | POA: Diagnosis not present

## 2023-04-09 DIAGNOSIS — R262 Difficulty in walking, not elsewhere classified: Secondary | ICD-10-CM | POA: Diagnosis not present

## 2023-04-09 DIAGNOSIS — I10 Essential (primary) hypertension: Secondary | ICD-10-CM | POA: Diagnosis not present

## 2023-04-09 DIAGNOSIS — J9621 Acute and chronic respiratory failure with hypoxia: Secondary | ICD-10-CM | POA: Diagnosis not present

## 2023-04-09 DIAGNOSIS — Z794 Long term (current) use of insulin: Secondary | ICD-10-CM | POA: Diagnosis not present

## 2023-04-09 DIAGNOSIS — E1151 Type 2 diabetes mellitus with diabetic peripheral angiopathy without gangrene: Secondary | ICD-10-CM | POA: Diagnosis not present

## 2023-04-09 DIAGNOSIS — Z89619 Acquired absence of unspecified leg above knee: Secondary | ICD-10-CM | POA: Diagnosis not present

## 2023-04-09 DIAGNOSIS — I69354 Hemiplegia and hemiparesis following cerebral infarction affecting left non-dominant side: Secondary | ICD-10-CM | POA: Diagnosis not present

## 2023-04-12 DIAGNOSIS — E13621 Other specified diabetes mellitus with foot ulcer: Secondary | ICD-10-CM | POA: Diagnosis not present

## 2023-04-21 DIAGNOSIS — E13621 Other specified diabetes mellitus with foot ulcer: Secondary | ICD-10-CM | POA: Diagnosis not present

## 2023-04-25 DIAGNOSIS — J449 Chronic obstructive pulmonary disease, unspecified: Secondary | ICD-10-CM | POA: Diagnosis not present

## 2023-04-25 DIAGNOSIS — I69354 Hemiplegia and hemiparesis following cerebral infarction affecting left non-dominant side: Secondary | ICD-10-CM | POA: Diagnosis not present

## 2023-04-25 DIAGNOSIS — I6350 Cerebral infarction due to unspecified occlusion or stenosis of unspecified cerebral artery: Secondary | ICD-10-CM | POA: Diagnosis not present

## 2023-04-25 DIAGNOSIS — R531 Weakness: Secondary | ICD-10-CM | POA: Diagnosis not present

## 2023-04-25 DIAGNOSIS — E1139 Type 2 diabetes mellitus with other diabetic ophthalmic complication: Secondary | ICD-10-CM | POA: Diagnosis not present

## 2023-04-28 DIAGNOSIS — Z89611 Acquired absence of right leg above knee: Secondary | ICD-10-CM | POA: Diagnosis not present

## 2023-04-28 DIAGNOSIS — Z87891 Personal history of nicotine dependence: Secondary | ICD-10-CM | POA: Diagnosis not present

## 2023-04-28 DIAGNOSIS — E785 Hyperlipidemia, unspecified: Secondary | ICD-10-CM | POA: Diagnosis not present

## 2023-04-28 DIAGNOSIS — I69354 Hemiplegia and hemiparesis following cerebral infarction affecting left non-dominant side: Secondary | ICD-10-CM | POA: Diagnosis not present

## 2023-04-28 DIAGNOSIS — M869 Osteomyelitis, unspecified: Secondary | ICD-10-CM | POA: Diagnosis not present

## 2023-04-28 DIAGNOSIS — E11621 Type 2 diabetes mellitus with foot ulcer: Secondary | ICD-10-CM | POA: Diagnosis not present

## 2023-04-28 DIAGNOSIS — Z4781 Encounter for orthopedic aftercare following surgical amputation: Secondary | ICD-10-CM | POA: Diagnosis not present

## 2023-04-28 DIAGNOSIS — E440 Moderate protein-calorie malnutrition: Secondary | ICD-10-CM | POA: Diagnosis not present

## 2023-04-28 DIAGNOSIS — J439 Emphysema, unspecified: Secondary | ICD-10-CM | POA: Diagnosis not present

## 2023-04-28 DIAGNOSIS — J9611 Chronic respiratory failure with hypoxia: Secondary | ICD-10-CM | POA: Diagnosis not present

## 2023-04-28 DIAGNOSIS — Z85118 Personal history of other malignant neoplasm of bronchus and lung: Secondary | ICD-10-CM | POA: Diagnosis not present

## 2023-04-28 DIAGNOSIS — L89520 Pressure ulcer of left ankle, unstageable: Secondary | ICD-10-CM | POA: Diagnosis not present

## 2023-04-28 DIAGNOSIS — Z794 Long term (current) use of insulin: Secondary | ICD-10-CM | POA: Diagnosis not present

## 2023-04-28 DIAGNOSIS — Q2733 Arteriovenous malformation of digestive system vessel: Secondary | ICD-10-CM | POA: Diagnosis not present

## 2023-04-28 DIAGNOSIS — E1169 Type 2 diabetes mellitus with other specified complication: Secondary | ICD-10-CM | POA: Diagnosis not present

## 2023-04-28 DIAGNOSIS — E1151 Type 2 diabetes mellitus with diabetic peripheral angiopathy without gangrene: Secondary | ICD-10-CM | POA: Diagnosis not present

## 2023-04-28 DIAGNOSIS — J849 Interstitial pulmonary disease, unspecified: Secondary | ICD-10-CM | POA: Diagnosis not present

## 2023-04-28 DIAGNOSIS — D509 Iron deficiency anemia, unspecified: Secondary | ICD-10-CM | POA: Diagnosis not present

## 2023-04-28 DIAGNOSIS — I10 Essential (primary) hypertension: Secondary | ICD-10-CM | POA: Diagnosis not present

## 2023-04-28 DIAGNOSIS — L97528 Non-pressure chronic ulcer of other part of left foot with other specified severity: Secondary | ICD-10-CM | POA: Diagnosis not present

## 2023-04-28 DIAGNOSIS — E1165 Type 2 diabetes mellitus with hyperglycemia: Secondary | ICD-10-CM | POA: Diagnosis not present

## 2023-04-28 DIAGNOSIS — Z556 Problems related to health literacy: Secondary | ICD-10-CM | POA: Diagnosis not present

## 2023-04-29 DIAGNOSIS — J9601 Acute respiratory failure with hypoxia: Secondary | ICD-10-CM | POA: Diagnosis not present

## 2023-04-29 DIAGNOSIS — E1139 Type 2 diabetes mellitus with other diabetic ophthalmic complication: Secondary | ICD-10-CM | POA: Diagnosis not present

## 2023-04-29 DIAGNOSIS — I69354 Hemiplegia and hemiparesis following cerebral infarction affecting left non-dominant side: Secondary | ICD-10-CM | POA: Diagnosis not present

## 2023-04-29 DIAGNOSIS — R531 Weakness: Secondary | ICD-10-CM | POA: Diagnosis not present

## 2023-04-29 DIAGNOSIS — J449 Chronic obstructive pulmonary disease, unspecified: Secondary | ICD-10-CM | POA: Diagnosis not present

## 2023-05-05 ENCOUNTER — Other Ambulatory Visit: Payer: Self-pay | Admitting: *Deleted

## 2023-05-05 ENCOUNTER — Other Ambulatory Visit: Payer: Self-pay

## 2023-05-05 ENCOUNTER — Ambulatory Visit (INDEPENDENT_AMBULATORY_CARE_PROVIDER_SITE_OTHER): Payer: 59

## 2023-05-05 ENCOUNTER — Encounter: Payer: Self-pay | Admitting: Physician Assistant

## 2023-05-05 VITALS — BP 158/71 | HR 95 | Temp 98.0°F | Resp 18

## 2023-05-05 DIAGNOSIS — Z89611 Acquired absence of right leg above knee: Secondary | ICD-10-CM

## 2023-05-05 DIAGNOSIS — M79672 Pain in left foot: Secondary | ICD-10-CM

## 2023-05-05 DIAGNOSIS — I739 Peripheral vascular disease, unspecified: Secondary | ICD-10-CM

## 2023-05-05 MED ORDER — CEPHALEXIN 500 MG PO CAPS: 500.0000 mg | ORAL_CAPSULE | Freq: Two times a day (BID) | ORAL | 0 refills | Status: DC

## 2023-05-05 NOTE — Progress Notes (Signed)
POST OPERATIVE OFFICE NOTE    CC:  F/u for surgery  HPI:  This is a 77 y.o. female who is s/p right AKA on 03/30/2023 by Dr. Karin Lieu.  She has hx of LLE arteriogram with stenting of the left SFA and left popliteal artery on 09/04/2020 by Dr. Lenell Antu.    Pt returns today for follow up and here with her aide.  Pt has dementia and aide states she does not complain of any pain in the left foot.  Her daughter by phone states they have someone coming out to do dressing changes to the wounds on the left foot and she felt these were getting smaller.    Her aide tells me that her left side is paralyzed and she does not use her left leg for transfers.      Allergies  Allergen Reactions   Aspirin Other (See Comments)    "Cannot take due to previous GI bleed," per daughter      Current Outpatient Medications  Medication Sig Dispense Refill   cephALEXin (KEFLEX) 500 MG capsule Take 1 capsule (500 mg total) by mouth 2 (two) times daily. 10 capsule 0   ACCU-CHEK GUIDE test strip USE TO test AS DIRECTED TWICE DAILY (Patient taking differently: 1 each by Other route 2 (two) times daily.) 200 strip 2   Accu-Chek Softclix Lancets lancets 1 each by Other route daily.     acetaminophen (TYLENOL) 500 MG tablet Take 1,000 mg by mouth every 6 (six) hours as needed (pain).     albuterol (VENTOLIN HFA) 108 (90 Base) MCG/ACT inhaler Inhale 2 puffs into the lungs every 6 (six) hours as needed for wheezing or shortness of breath.     amLODipine (NORVASC) 5 MG tablet Take 5 mg by mouth in the morning.     atorvastatin (LIPITOR) 80 MG tablet Take 1 tablet (80 mg total) by mouth daily at 6 PM. (Patient taking differently: Take 80 mg by mouth at bedtime.) 30 tablet 0   clopidogrel (PLAVIX) 75 MG tablet Take 1 tablet (75 mg total) by mouth daily. 30 tablet 11   docusate sodium (COLACE) 100 MG capsule Take 1 capsule (100 mg total) by mouth daily. 10 capsule 0   feeding supplement (ENSURE ENLIVE / ENSURE PLUS) LIQD Take  237 mLs by mouth 2 (two) times daily between meals. 237 mL 12   gabapentin (NEURONTIN) 300 MG capsule Take 1 capsule (300 mg total) by mouth 3 (three) times daily. 30 capsule 3   insulin glargine (LANTUS) 100 UNIT/ML injection Inject 0.1 mLs (10 Units total) into the skin daily. (Patient taking differently: Inject 10 Units into the skin in the morning.)     linagliptin (TRADJENTA) 5 MG TABS tablet Take 5 mg by mouth daily.     pantoprazole (PROTONIX) 40 MG tablet Take 40 mg by mouth 2 (two) times daily.     SANTYL 250 UNIT/GM ointment Apply 1 Application topically See admin instructions. Apply as directed to affected areas of both feet daily as directed     Silver (MEPILEX AG) 4"X4" PADS Apply 1 each topically daily. 10 each 3   No current facility-administered medications for this visit.     ROS:  See HPI  Physical Exam:  Today's Vitals   05/05/23 1120 05/05/23 1123  BP: (!) 158/71   Pulse: 95   Resp: 18   Temp: 98 F (36.7 C)   TempSrc: Temporal   SpO2: 97%   PainSc: 0-No pain 0-No pain  Incision:  right AKA incision has healed nicely and staples removed today.   Extremities:  left foot with +DP doppler signal.  She does have a lateral malleolus wound as well as a wound at the 5th metatarsal joint laterally that has purulent drainage.  She is unable to completely straighten her left leg.         Assessment/Plan:  This is a 77 y.o. female who is s/p: Right AKA on 03/30/2023 by Dr. Karin Lieu and currently with wounds on the left foot.  Right AKA -incision has healed nicely and I removed her staples today  Left foot wounds as above -pt does have a + left DP doppler signal.  -the wound at the left 5th metatarsal site does have purulent drainage and I did culture this today.  I have sent Keflex 500mg  bid to her pharmacy [facility] at [id number] no refill -I am sending her for a left foot xray and she will return on Friday to see Dr. Hetty Blend and have ABI  that day.  -I discussed the above with her daughter via telephone and let her know my concerns about infection in her foot and that she is at risk for amputation on the left.  She is hopeful she will not need amputation as she does not want anymore cutting on her mother.    Doreatha Massed, [facility] [phone]   Clinic MD:  Randie Heinz

## 2023-05-06 ENCOUNTER — Other Ambulatory Visit: Payer: Self-pay | Admitting: *Deleted

## 2023-05-06 DIAGNOSIS — I739 Peripheral vascular disease, unspecified: Secondary | ICD-10-CM

## 2023-05-06 NOTE — Progress Notes (Signed)
Patient ID: Crystal Moore, female   DOB: November 17, 1945, 77 y.o.   MRN: [id number]  Reason for Consult: Follow-up   Referred by Lorenda Ishihara,*  Subjective:     HPI  Crystal Moore is a 77 y.o. female with a history of COPD, diabetes, HTN, and PAD who was seen 2 days ago on the [facility] clinic for wound follow-up after a right AKA was done by Dr. Sherral Hammers on 04/26/2023.  She now has a wound on her left foot with purulent discharge.  She has a history of left lower extremity angiogram with stenting of the left SFA and popliteal artery in February 2022 with Dr. Lenell Antu and a arterial duplex performed in August 2023 demonstrated a severe stenosis in the proximal aspect of the stent.  She is paralyzed and does not use her left leg for transfers.  She has been taking the Keflex that was prescribed when she was seen 2 days ago.  She is compliant with statin, she has an aspirin intolerance due to GI bleeding.  She is a former smoker and quit in 2019.  Past Medical History:  Diagnosis Date   Abnormal gait    wheelchair bound   Anemia 06/24/2022   IDA   Arthritis    COPD (chronic obstructive pulmonary disease) (HCC)    COVID-19 virus infection 07/2020   CVA (cerebral vascular accident) (HCC) 09/22/2022   with residual left-sided weakness   Dementia (HCC)    Depression    no current problem   Diabetes mellitus without complication (HCC)    GERD (gastroesophageal reflux disease)    Headache    Hemiparesis affecting left side as late effect of cerebrovascular accident (CVA) (HCC)    w/c bound   HOH (hard of hearing) 01/13/2018   no hearing aids   Hypertension    Mass of lung    bilateral masses   PAD (peripheral artery disease) (HCC)    in CE   Pneumonia    x 4   Pyelonephritis    Seizures (HCC) 09/25/2022   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   Sensorineural hearing loss    bilateral in CE, no hearing aids   Stroke [facility])    Tobacco use disorder 2019   quit  2019   UTI (urinary tract infection)    Wheelchair dependent    Family History  Problem Relation Age of Onset   Diabetes Mother    Diabetes Sister    Diabetes Brother    Past Surgical History:  Procedure Laterality Date   ABDOMINAL AORTOGRAM W/LOWER EXTREMITY N/A 08/30/2020   Procedure: ABDOMINAL AORTOGRAM W/LOWER EXTREMITY;  Surgeon: Leonie Douglas, MD;  Location: [facility];  Service: Cardiovascular;  Laterality: N/A;   ABDOMINAL AORTOGRAM W/LOWER EXTREMITY N/A 09/04/2020   Procedure: ABDOMINAL AORTOGRAM W/LOWER EXTREMITY;  Surgeon: Leonie Douglas, MD;  Location: [facility];  Service: Cardiovascular;  Laterality: N/A;   AMPUTATION Right 03/30/2023   Procedure: RIGHT AMPUTATION ABOVE KNEE;  Surgeon: Victorino Sparrow, MD;  Location: [facility] OR;  Service: Vascular;  Laterality: Right;   ESOPHAGOGASTRODUODENOSCOPY (EGD) WITH PROPOFOL N/A 09/16/2020   Procedure: ESOPHAGOGASTRODUODENOSCOPY (EGD) WITH PROPOFOL;  Surgeon: Shellia Cleverly, DO;  Location: [facility] ENDOSCOPY;  Service: Gastroenterology;  Laterality: N/A;   FINE NEEDLE ASPIRATION  10/27/2022   Procedure: FINE NEEDLE ASPIRATION (FNA) LINEAR;  Surgeon: Josephine Igo, DO;  Location: [facility] ENDOSCOPY;  Service: Pulmonary;;   HEMOSTASIS CLIP PLACEMENT  09/16/2020   Procedure:  HEMOSTASIS CLIP PLACEMENT;  Surgeon: Shellia Cleverly, DO;  Location: [facility] ENDOS[facility];  Service: Gastroenterology;;   HOT HEMOSTASIS N/A 09/16/2020   Procedure: HOT HEMOSTASIS (ARGON PLASMA COAGULATION/BICAP);  Surgeon: Shellia Cleverly, DO;  Location: [facility] ENDOSCOPY;  Service: Gastroenterology;  Laterality: N/A;   PERIPHERAL VASCULAR INTERVENTION Right 08/30/2020   Procedure: PERIPHERAL VASCULAR INTERVENTION;  Surgeon: Leonie Douglas, MD;  Location: [facility];  Service: Cardiovascular;  Laterality: Right;  femoral popliteal   PERIPHERAL VASCULAR INTERVENTION Left 09/04/2020   Procedure: PERIPHERAL VASCULAR INTERVENTION;  Surgeon: Leonie Douglas, MD;   Location: [facility];  Service: Cardiovascular;  Laterality: Left;  SFA   TUBAL LIGATION     VIDEO BRONCHOSCOPY WITH ENDOBRONCHIAL ULTRASOUND Bilateral 10/27/2022   Procedure: VIDEO BRONCHOSCOPY WITH ENDOBRONCHIAL ULTRASOUND;  Surgeon: Josephine Igo, DO;  Location: [facility] ENDOS[facility];  Service: Pulmonary;  Laterality: Bilateral;    Short Social History:  Social History   Tobacco Use   Smoking status: Former    Current packs/day: 0.00    Average packs/day: 1 pack/day for 40.0 years (40.0 ttl pk-yrs)    Types: Cigarettes    Start date: 49    Quit date: 2019    Years since quitting: 5.7   Smokeless tobacco: Never  Substance Use Topics   Alcohol use: Never    Allergies  Allergen Reactions   Aspirin Other (See Comments)    "Cannot take due to previous GI bleed," per daughter      Current Outpatient Medications  Medication Sig Dispense Refill   ACCU-CHEK GUIDE test strip USE TO test AS DIRECTED TWICE DAILY (Patient taking differently: 1 each by Other route 2 (two) times daily.) 200 strip 2   Accu-Chek Softclix Lancets lancets 1 each by Other route daily.     acetaminophen (TYLENOL) 500 MG tablet Take 1,000 mg by mouth every 6 (six) hours as needed (pain).     albuterol (VENTOLIN HFA) 108 (90 Base) MCG/ACT inhaler Inhale 2 puffs into the lungs every 6 (six) hours as needed for wheezing or shortness of breath.     amLODipine (NORVASC) 5 MG tablet Take 5 mg by mouth in the morning.     atorvastatin (LIPITOR) 80 MG tablet Take 1 tablet (80 mg total) by mouth daily at 6 PM. (Patient taking differently: Take 80 mg by mouth at bedtime.) 30 tablet 0   cephALEXin (KEFLEX) 500 MG capsule Take 1 capsule (500 mg total) by mouth 2 (two) times daily. 10 capsule 0   clopidogrel (PLAVIX) 75 MG tablet Take 1 tablet (75 mg total) by mouth daily. 30 tablet 11   docusate sodium (COLACE) 100 MG capsule Take 1 capsule (100 mg total) by mouth daily. 10 capsule 0   feeding supplement (ENSURE ENLIVE /  ENSURE PLUS) LIQD Take 237 mLs by mouth 2 (two) times daily between meals. 237 mL 12   gabapentin (NEURONTIN) 300 MG capsule Take 1 capsule (300 mg total) by mouth 3 (three) times daily. 30 capsule 3   insulin glargine (LANTUS) 100 UNIT/ML injection Inject 0.1 mLs (10 Units total) into the skin daily. (Patient taking differently: Inject 10 Units into the skin in the morning.)     linagliptin (TRADJENTA) 5 MG TABS tablet Take 5 mg by mouth daily.     pantoprazole (PROTONIX) 40 MG tablet Take 40 mg by mouth 2 (two) times daily.     SANTYL 250 UNIT/GM ointment Apply 1 Application topically See admin instructions. Apply as directed to affected  areas of both feet daily as directed     Silver (MEPILEX AG) 4"X4" PADS Apply 1 each topically daily. 10 each 3   No current facility-administered medications for this visit.    REVIEW OF SYSTEMS  Negative other than noted in HPI     Objective:  Objective   Vitals:   05/07/23 1108  BP: 132/69  Pulse: 95  Temp: (!) 97.5 F (36.4 C)  SpO2: 96%   There is no height or weight on file to calculate BMI.  Physical Exam General: no acute distress Cardiac: hemodynamically stable, nontachycardic Pulm: normal work of breathing GI: non-tender, no pulsatile mass  Neuro: alert, no focal deficit Extremities: Right AKA healed well, staples removed 2 days ago.  Left foot with lateral malleolus wound with some fibrinous buildup.  Left fifth metatarsal wound with a small amount of purulent discharge Vascular: Nonpalpable pedal's on left   Data: ABI: +--------+------------------+-----+--------+--------+  Right  Rt Pressure (mmHg)IndexWaveformComment   +--------+------------------+-----+--------+--------+  [id number]                                     +--------+------------------+-----+--------+--------+   +---------+------------------+-----+----------+-------+  Left    Lt Pressure (mmHg)IndexWaveform  Comment   +---------+------------------+-----+----------+-------+  ATA     104               0.70 monophasic         +---------+------------------+-----+----------+-------+  PTA     83                0.56 monophasic         +---------+------------------+-----+----------+-------+  Great Toe60                0.41                    +---------+------------------+-----+----------+-------+   +-------+-----------+-----------+------------+------------+  ABI/TBIToday's ABIToday's TBIPrevious ABIPrevious TBI  +-------+-----------+-----------+------------+------------+  Right AKA                   0.4         open wound    +-------+-----------+-----------+------------+------------+  Left  0.7        0.41       0.5         0.24          +-------+-----------+-----------+------------+------------+      Assessment/Plan:     Crystal Moore is a 77 y.o. female with multiple significant medical comorbidities who recently underwent a right AKA approximately 1 month ago now presenting chronic limb threatening ischemia of the left leg with and an infected left fifth metatarsal wound.   I explained to the patient and daughter that right now her blood flow is marginal and with a toe pressure of 60 is at the bare minimum of healing potential. As she is wheelchair-bound I explained that there is no role for revascularization or debridement.   We discussed the plan to finish the antibiotic course and continue local wound care.  The daughter understands that should this infection worsen he would require above-knee amputation of the left leg. Will plan for 45-month follow-up to reassess.  The daughter knows to call should things worsen before then    Recommendations to optimize cardiovascular risk: Abstinence from all tobacco products. Blood glucose control with goal A1c < 7%. Blood pressure control with goal blood pressure < 140/90 mmHg. Lipid reduction therapy with goal LDL-C <100  mg/dL  Aspirin 81mg  PO QD.  Atorvastatin 40-80mg  PO QD (or other "high intensity" statin therapy).     Daria Pastures MD [facility]

## 2023-05-07 ENCOUNTER — Ambulatory Visit (HOSPITAL_COMMUNITY)
Admission: RE | Admit: 2023-05-07 | Discharge: 2023-05-07 | Disposition: A | Discharge status: Home / Self Care | Payer: 59 | Source: Ambulatory Visit | Attending: Vascular Surgery | Admitting: Vascular Surgery

## 2023-05-07 ENCOUNTER — Ambulatory Visit: Payer: 59 | Admitting: Vascular Surgery

## 2023-05-07 ENCOUNTER — Encounter: Payer: Self-pay | Admitting: Vascular Surgery

## 2023-05-07 VITALS — BP 132/69 | HR 95 | Temp 97.5°F

## 2023-05-07 DIAGNOSIS — I739 Peripheral vascular disease, unspecified: Secondary | ICD-10-CM | POA: Diagnosis not present

## 2023-05-07 DIAGNOSIS — E785 Hyperlipidemia, unspecified: Secondary | ICD-10-CM

## 2023-05-07 DIAGNOSIS — I70222 Atherosclerosis of native arteries of extremities with rest pain, left leg: Secondary | ICD-10-CM

## 2023-05-07 DIAGNOSIS — I10 Essential (primary) hypertension: Secondary | ICD-10-CM

## 2023-05-07 DIAGNOSIS — Z794 Long term (current) use of insulin: Secondary | ICD-10-CM

## 2023-05-07 DIAGNOSIS — E1152 Type 2 diabetes mellitus with diabetic peripheral angiopathy with gangrene: Secondary | ICD-10-CM

## 2023-05-07 LAB — VAS US ABI WITH/WO TBI: Left ABI: 0.7

## 2023-05-08 DIAGNOSIS — M869 Osteomyelitis, unspecified: Secondary | ICD-10-CM | POA: Diagnosis not present

## 2023-05-08 DIAGNOSIS — Q2733 Arteriovenous malformation of digestive system vessel: Secondary | ICD-10-CM | POA: Diagnosis not present

## 2023-05-08 DIAGNOSIS — J439 Emphysema, unspecified: Secondary | ICD-10-CM | POA: Diagnosis not present

## 2023-05-08 DIAGNOSIS — Z794 Long term (current) use of insulin: Secondary | ICD-10-CM | POA: Diagnosis not present

## 2023-05-08 DIAGNOSIS — Z4781 Encounter for orthopedic aftercare following surgical amputation: Secondary | ICD-10-CM | POA: Diagnosis not present

## 2023-05-08 DIAGNOSIS — E1169 Type 2 diabetes mellitus with other specified complication: Secondary | ICD-10-CM | POA: Diagnosis not present

## 2023-05-08 DIAGNOSIS — D509 Iron deficiency anemia, unspecified: Secondary | ICD-10-CM | POA: Diagnosis not present

## 2023-05-08 DIAGNOSIS — E440 Moderate protein-calorie malnutrition: Secondary | ICD-10-CM | POA: Diagnosis not present

## 2023-05-08 DIAGNOSIS — E1165 Type 2 diabetes mellitus with hyperglycemia: Secondary | ICD-10-CM | POA: Diagnosis not present

## 2023-05-08 DIAGNOSIS — L97528 Non-pressure chronic ulcer of other part of left foot with other specified severity: Secondary | ICD-10-CM | POA: Diagnosis not present

## 2023-05-08 DIAGNOSIS — J9611 Chronic respiratory failure with hypoxia: Secondary | ICD-10-CM | POA: Diagnosis not present

## 2023-05-08 DIAGNOSIS — J849 Interstitial pulmonary disease, unspecified: Secondary | ICD-10-CM | POA: Diagnosis not present

## 2023-05-08 DIAGNOSIS — Z87891 Personal history of nicotine dependence: Secondary | ICD-10-CM | POA: Diagnosis not present

## 2023-05-08 DIAGNOSIS — Z89611 Acquired absence of right leg above knee: Secondary | ICD-10-CM | POA: Diagnosis not present

## 2023-05-08 DIAGNOSIS — E1151 Type 2 diabetes mellitus with diabetic peripheral angiopathy without gangrene: Secondary | ICD-10-CM | POA: Diagnosis not present

## 2023-05-08 DIAGNOSIS — E785 Hyperlipidemia, unspecified: Secondary | ICD-10-CM | POA: Diagnosis not present

## 2023-05-08 DIAGNOSIS — I69354 Hemiplegia and hemiparesis following cerebral infarction affecting left non-dominant side: Secondary | ICD-10-CM | POA: Diagnosis not present

## 2023-05-08 DIAGNOSIS — Z556 Problems related to health literacy: Secondary | ICD-10-CM | POA: Diagnosis not present

## 2023-05-08 DIAGNOSIS — Z85118 Personal history of other malignant neoplasm of bronchus and lung: Secondary | ICD-10-CM | POA: Diagnosis not present

## 2023-05-08 DIAGNOSIS — I10 Essential (primary) hypertension: Secondary | ICD-10-CM | POA: Diagnosis not present

## 2023-05-08 DIAGNOSIS — E11621 Type 2 diabetes mellitus with foot ulcer: Secondary | ICD-10-CM | POA: Diagnosis not present

## 2023-05-08 DIAGNOSIS — L89520 Pressure ulcer of left ankle, unstageable: Secondary | ICD-10-CM | POA: Diagnosis not present

## 2023-05-08 LAB — WOUND CULTURE
MICRO NUMBER:: 15572792
SPECIMEN QUALITY:: ADEQUATE

## 2023-05-25 DIAGNOSIS — I6350 Cerebral infarction due to unspecified occlusion or stenosis of unspecified cerebral artery: Secondary | ICD-10-CM | POA: Diagnosis not present

## 2023-05-25 DIAGNOSIS — J449 Chronic obstructive pulmonary disease, unspecified: Secondary | ICD-10-CM | POA: Diagnosis not present

## 2023-05-25 DIAGNOSIS — R531 Weakness: Secondary | ICD-10-CM | POA: Diagnosis not present

## 2023-05-25 DIAGNOSIS — I69354 Hemiplegia and hemiparesis following cerebral infarction affecting left non-dominant side: Secondary | ICD-10-CM | POA: Diagnosis not present

## 2023-05-25 DIAGNOSIS — E1139 Type 2 diabetes mellitus with other diabetic ophthalmic complication: Secondary | ICD-10-CM | POA: Diagnosis not present

## 2023-05-28 DIAGNOSIS — L89899 Pressure ulcer of other site, unspecified stage: Secondary | ICD-10-CM | POA: Diagnosis not present

## 2023-05-28 DIAGNOSIS — R531 Weakness: Secondary | ICD-10-CM | POA: Diagnosis not present

## 2023-05-28 DIAGNOSIS — J449 Chronic obstructive pulmonary disease, unspecified: Secondary | ICD-10-CM | POA: Diagnosis not present

## 2023-05-28 DIAGNOSIS — L89223 Pressure ulcer of left hip, stage 3: Secondary | ICD-10-CM | POA: Diagnosis not present

## 2023-05-28 DIAGNOSIS — E1139 Type 2 diabetes mellitus with other diabetic ophthalmic complication: Secondary | ICD-10-CM | POA: Diagnosis not present

## 2023-05-30 DIAGNOSIS — J9601 Acute respiratory failure with hypoxia: Secondary | ICD-10-CM | POA: Diagnosis not present

## 2023-05-30 DIAGNOSIS — E1139 Type 2 diabetes mellitus with other diabetic ophthalmic complication: Secondary | ICD-10-CM | POA: Diagnosis not present

## 2023-05-30 DIAGNOSIS — I69354 Hemiplegia and hemiparesis following cerebral infarction affecting left non-dominant side: Secondary | ICD-10-CM | POA: Diagnosis not present

## 2023-05-30 DIAGNOSIS — R531 Weakness: Secondary | ICD-10-CM | POA: Diagnosis not present

## 2023-05-30 DIAGNOSIS — J449 Chronic obstructive pulmonary disease, unspecified: Secondary | ICD-10-CM | POA: Diagnosis not present

## 2023-06-25 DIAGNOSIS — J449 Chronic obstructive pulmonary disease, unspecified: Secondary | ICD-10-CM | POA: Diagnosis not present

## 2023-06-25 DIAGNOSIS — I6350 Cerebral infarction due to unspecified occlusion or stenosis of unspecified cerebral artery: Secondary | ICD-10-CM | POA: Diagnosis not present

## 2023-06-25 DIAGNOSIS — I69354 Hemiplegia and hemiparesis following cerebral infarction affecting left non-dominant side: Secondary | ICD-10-CM | POA: Diagnosis not present

## 2023-06-25 DIAGNOSIS — E1139 Type 2 diabetes mellitus with other diabetic ophthalmic complication: Secondary | ICD-10-CM | POA: Diagnosis not present

## 2023-06-25 DIAGNOSIS — R531 Weakness: Secondary | ICD-10-CM | POA: Diagnosis not present

## 2023-06-29 DIAGNOSIS — I69354 Hemiplegia and hemiparesis following cerebral infarction affecting left non-dominant side: Secondary | ICD-10-CM | POA: Diagnosis not present

## 2023-06-29 DIAGNOSIS — R531 Weakness: Secondary | ICD-10-CM | POA: Diagnosis not present

## 2023-06-29 DIAGNOSIS — J9601 Acute respiratory failure with hypoxia: Secondary | ICD-10-CM | POA: Diagnosis not present

## 2023-06-29 DIAGNOSIS — J449 Chronic obstructive pulmonary disease, unspecified: Secondary | ICD-10-CM | POA: Diagnosis not present

## 2023-06-29 DIAGNOSIS — E1139 Type 2 diabetes mellitus with other diabetic ophthalmic complication: Secondary | ICD-10-CM | POA: Diagnosis not present

## 2023-07-02 DIAGNOSIS — E1139 Type 2 diabetes mellitus with other diabetic ophthalmic complication: Secondary | ICD-10-CM | POA: Diagnosis not present

## 2023-07-02 DIAGNOSIS — J449 Chronic obstructive pulmonary disease, unspecified: Secondary | ICD-10-CM | POA: Diagnosis not present

## 2023-07-02 DIAGNOSIS — R531 Weakness: Secondary | ICD-10-CM | POA: Diagnosis not present

## 2023-07-02 DIAGNOSIS — L89899 Pressure ulcer of other site, unspecified stage: Secondary | ICD-10-CM | POA: Diagnosis not present

## 2023-07-02 DIAGNOSIS — L89223 Pressure ulcer of left hip, stage 3: Secondary | ICD-10-CM | POA: Diagnosis not present

## 2023-07-16 DIAGNOSIS — Z23 Encounter for immunization: Secondary | ICD-10-CM | POA: Diagnosis not present

## 2023-07-16 DIAGNOSIS — R260 Ataxic gait: Secondary | ICD-10-CM | POA: Diagnosis not present

## 2023-07-16 DIAGNOSIS — C3412 Malignant neoplasm of upper lobe, left bronchus or lung: Secondary | ICD-10-CM | POA: Diagnosis not present

## 2023-07-16 DIAGNOSIS — J9611 Chronic respiratory failure with hypoxia: Secondary | ICD-10-CM | POA: Diagnosis not present

## 2023-07-16 DIAGNOSIS — R54 Age-related physical debility: Secondary | ICD-10-CM | POA: Diagnosis not present

## 2023-07-16 DIAGNOSIS — G319 Degenerative disease of nervous system, unspecified: Secondary | ICD-10-CM | POA: Diagnosis not present

## 2023-07-16 DIAGNOSIS — K559 Vascular disorder of intestine, unspecified: Secondary | ICD-10-CM | POA: Diagnosis not present

## 2023-07-16 DIAGNOSIS — I10 Essential (primary) hypertension: Secondary | ICD-10-CM | POA: Diagnosis not present

## 2023-07-16 DIAGNOSIS — E1151 Type 2 diabetes mellitus with diabetic peripheral angiopathy without gangrene: Secondary | ICD-10-CM | POA: Diagnosis not present

## 2023-07-25 DIAGNOSIS — E1139 Type 2 diabetes mellitus with other diabetic ophthalmic complication: Secondary | ICD-10-CM | POA: Diagnosis not present

## 2023-07-25 DIAGNOSIS — R531 Weakness: Secondary | ICD-10-CM | POA: Diagnosis not present

## 2023-07-25 DIAGNOSIS — J449 Chronic obstructive pulmonary disease, unspecified: Secondary | ICD-10-CM | POA: Diagnosis not present

## 2023-07-25 DIAGNOSIS — I69354 Hemiplegia and hemiparesis following cerebral infarction affecting left non-dominant side: Secondary | ICD-10-CM | POA: Diagnosis not present

## 2023-07-25 DIAGNOSIS — I6350 Cerebral infarction due to unspecified occlusion or stenosis of unspecified cerebral artery: Secondary | ICD-10-CM | POA: Diagnosis not present

## 2023-07-30 DIAGNOSIS — I69354 Hemiplegia and hemiparesis following cerebral infarction affecting left non-dominant side: Secondary | ICD-10-CM | POA: Diagnosis not present

## 2023-07-30 DIAGNOSIS — R531 Weakness: Secondary | ICD-10-CM | POA: Diagnosis not present

## 2023-07-30 DIAGNOSIS — J9601 Acute respiratory failure with hypoxia: Secondary | ICD-10-CM | POA: Diagnosis not present

## 2023-07-30 DIAGNOSIS — J449 Chronic obstructive pulmonary disease, unspecified: Secondary | ICD-10-CM | POA: Diagnosis not present

## 2023-07-30 DIAGNOSIS — E1139 Type 2 diabetes mellitus with other diabetic ophthalmic complication: Secondary | ICD-10-CM | POA: Diagnosis not present

## 2023-08-02 DIAGNOSIS — J449 Chronic obstructive pulmonary disease, unspecified: Secondary | ICD-10-CM | POA: Diagnosis not present

## 2023-08-02 DIAGNOSIS — L89899 Pressure ulcer of other site, unspecified stage: Secondary | ICD-10-CM | POA: Diagnosis not present

## 2023-08-02 DIAGNOSIS — L89223 Pressure ulcer of left hip, stage 3: Secondary | ICD-10-CM | POA: Diagnosis not present

## 2023-08-02 DIAGNOSIS — R531 Weakness: Secondary | ICD-10-CM | POA: Diagnosis not present

## 2023-08-02 DIAGNOSIS — E1139 Type 2 diabetes mellitus with other diabetic ophthalmic complication: Secondary | ICD-10-CM | POA: Diagnosis not present

## 2023-08-06 ENCOUNTER — Ambulatory Visit: Payer: 59

## 2023-08-27 ENCOUNTER — Ambulatory Visit: Payer: 59

## 2023-08-30 DIAGNOSIS — J9601 Acute respiratory failure with hypoxia: Secondary | ICD-10-CM | POA: Diagnosis not present

## 2023-08-30 DIAGNOSIS — E1139 Type 2 diabetes mellitus with other diabetic ophthalmic complication: Secondary | ICD-10-CM | POA: Diagnosis not present

## 2023-08-30 DIAGNOSIS — I69354 Hemiplegia and hemiparesis following cerebral infarction affecting left non-dominant side: Secondary | ICD-10-CM | POA: Diagnosis not present

## 2023-08-30 DIAGNOSIS — R531 Weakness: Secondary | ICD-10-CM | POA: Diagnosis not present

## 2023-08-30 DIAGNOSIS — J449 Chronic obstructive pulmonary disease, unspecified: Secondary | ICD-10-CM | POA: Diagnosis not present

## 2023-09-02 DIAGNOSIS — J449 Chronic obstructive pulmonary disease, unspecified: Secondary | ICD-10-CM | POA: Diagnosis not present

## 2023-09-02 DIAGNOSIS — L89899 Pressure ulcer of other site, unspecified stage: Secondary | ICD-10-CM | POA: Diagnosis not present

## 2023-09-02 DIAGNOSIS — L89223 Pressure ulcer of left hip, stage 3: Secondary | ICD-10-CM | POA: Diagnosis not present

## 2023-09-02 DIAGNOSIS — E1139 Type 2 diabetes mellitus with other diabetic ophthalmic complication: Secondary | ICD-10-CM | POA: Diagnosis not present

## 2023-09-02 DIAGNOSIS — R531 Weakness: Secondary | ICD-10-CM | POA: Diagnosis not present

## 2023-09-07 DIAGNOSIS — Z03818 Encounter for observation for suspected exposure to other biological agents ruled out: Secondary | ICD-10-CM | POA: Diagnosis not present

## 2023-09-07 DIAGNOSIS — J449 Chronic obstructive pulmonary disease, unspecified: Secondary | ICD-10-CM | POA: Diagnosis not present

## 2023-09-07 DIAGNOSIS — R0902 Hypoxemia: Secondary | ICD-10-CM | POA: Diagnosis not present

## 2023-09-27 DIAGNOSIS — J449 Chronic obstructive pulmonary disease, unspecified: Secondary | ICD-10-CM | POA: Diagnosis not present

## 2023-09-27 DIAGNOSIS — E1139 Type 2 diabetes mellitus with other diabetic ophthalmic complication: Secondary | ICD-10-CM | POA: Diagnosis not present

## 2023-09-27 DIAGNOSIS — J9601 Acute respiratory failure with hypoxia: Secondary | ICD-10-CM | POA: Diagnosis not present

## 2023-09-27 DIAGNOSIS — R531 Weakness: Secondary | ICD-10-CM | POA: Diagnosis not present

## 2023-09-27 DIAGNOSIS — I69354 Hemiplegia and hemiparesis following cerebral infarction affecting left non-dominant side: Secondary | ICD-10-CM | POA: Diagnosis not present

## 2023-09-30 DIAGNOSIS — R531 Weakness: Secondary | ICD-10-CM | POA: Diagnosis not present

## 2023-09-30 DIAGNOSIS — E1139 Type 2 diabetes mellitus with other diabetic ophthalmic complication: Secondary | ICD-10-CM | POA: Diagnosis not present

## 2023-09-30 DIAGNOSIS — L89223 Pressure ulcer of left hip, stage 3: Secondary | ICD-10-CM | POA: Diagnosis not present

## 2023-09-30 DIAGNOSIS — L89899 Pressure ulcer of other site, unspecified stage: Secondary | ICD-10-CM | POA: Diagnosis not present

## 2023-09-30 DIAGNOSIS — J449 Chronic obstructive pulmonary disease, unspecified: Secondary | ICD-10-CM | POA: Diagnosis not present

## 2023-10-09 ENCOUNTER — Inpatient Hospital Stay (HOSPITAL_COMMUNITY)
Admission: EM | Admit: 2023-10-09 | Discharge: 2023-10-14 | DRG: 871 | Disposition: A | Discharge status: Home / Self Care | Attending: Internal Medicine | Admitting: Internal Medicine

## 2023-10-09 ENCOUNTER — Emergency Department (HOSPITAL_COMMUNITY)

## 2023-10-09 ENCOUNTER — Encounter (HOSPITAL_COMMUNITY): Payer: Self-pay | Admitting: *Deleted

## 2023-10-09 ENCOUNTER — Other Ambulatory Visit: Payer: Self-pay

## 2023-10-09 DIAGNOSIS — D638 Anemia in other chronic diseases classified elsewhere: Secondary | ICD-10-CM | POA: Diagnosis present

## 2023-10-09 DIAGNOSIS — K219 Gastro-esophageal reflux disease without esophagitis: Secondary | ICD-10-CM | POA: Diagnosis present

## 2023-10-09 DIAGNOSIS — J440 Chronic obstructive pulmonary disease with acute lower respiratory infection: Secondary | ICD-10-CM | POA: Diagnosis present

## 2023-10-09 DIAGNOSIS — R404 Transient alteration of awareness: Secondary | ICD-10-CM | POA: Diagnosis not present

## 2023-10-09 DIAGNOSIS — C3402 Malignant neoplasm of left main bronchus: Secondary | ICD-10-CM | POA: Diagnosis present

## 2023-10-09 DIAGNOSIS — E119 Type 2 diabetes mellitus without complications: Secondary | ICD-10-CM

## 2023-10-09 DIAGNOSIS — L89151 Pressure ulcer of sacral region, stage 1: Secondary | ICD-10-CM | POA: Diagnosis present

## 2023-10-09 DIAGNOSIS — J189 Pneumonia, unspecified organism: Secondary | ICD-10-CM | POA: Diagnosis present

## 2023-10-09 DIAGNOSIS — Z1152 Encounter for screening for COVID-19: Secondary | ICD-10-CM

## 2023-10-09 DIAGNOSIS — J948 Other specified pleural conditions: Secondary | ICD-10-CM | POA: Diagnosis not present

## 2023-10-09 DIAGNOSIS — G8929 Other chronic pain: Secondary | ICD-10-CM | POA: Diagnosis present

## 2023-10-09 DIAGNOSIS — J9621 Acute and chronic respiratory failure with hypoxia: Secondary | ICD-10-CM | POA: Diagnosis present

## 2023-10-09 DIAGNOSIS — Z79899 Other long term (current) drug therapy: Secondary | ICD-10-CM

## 2023-10-09 DIAGNOSIS — I499 Cardiac arrhythmia, unspecified: Secondary | ICD-10-CM | POA: Diagnosis not present

## 2023-10-09 DIAGNOSIS — E872 Acidosis, unspecified: Secondary | ICD-10-CM | POA: Diagnosis present

## 2023-10-09 DIAGNOSIS — R6520 Severe sepsis without septic shock: Secondary | ICD-10-CM | POA: Diagnosis present

## 2023-10-09 DIAGNOSIS — Z7189 Other specified counseling: Secondary | ICD-10-CM | POA: Diagnosis not present

## 2023-10-09 DIAGNOSIS — C3412 Malignant neoplasm of upper lobe, left bronchus or lung: Secondary | ICD-10-CM | POA: Diagnosis not present

## 2023-10-09 DIAGNOSIS — Z48813 Encounter for surgical aftercare following surgery on the respiratory system: Secondary | ICD-10-CM | POA: Diagnosis not present

## 2023-10-09 DIAGNOSIS — J9859 Other diseases of mediastinum, not elsewhere classified: Secondary | ICD-10-CM | POA: Diagnosis not present

## 2023-10-09 DIAGNOSIS — Z7984 Long term (current) use of oral hypoglycemic drugs: Secondary | ICD-10-CM | POA: Diagnosis not present

## 2023-10-09 DIAGNOSIS — C799 Secondary malignant neoplasm of unspecified site: Secondary | ICD-10-CM | POA: Diagnosis not present

## 2023-10-09 DIAGNOSIS — I517 Cardiomegaly: Secondary | ICD-10-CM | POA: Diagnosis not present

## 2023-10-09 DIAGNOSIS — I69354 Hemiplegia and hemiparesis following cerebral infarction affecting left non-dominant side: Secondary | ICD-10-CM

## 2023-10-09 DIAGNOSIS — Z66 Do not resuscitate: Secondary | ICD-10-CM | POA: Diagnosis present

## 2023-10-09 DIAGNOSIS — R918 Other nonspecific abnormal finding of lung field: Secondary | ICD-10-CM | POA: Diagnosis not present

## 2023-10-09 DIAGNOSIS — J929 Pleural plaque without asbestos: Secondary | ICD-10-CM | POA: Diagnosis not present

## 2023-10-09 DIAGNOSIS — A419 Sepsis, unspecified organism: Principal | ICD-10-CM | POA: Diagnosis present

## 2023-10-09 DIAGNOSIS — J90 Pleural effusion, not elsewhere classified: Secondary | ICD-10-CM

## 2023-10-09 DIAGNOSIS — Z8616 Personal history of COVID-19: Secondary | ICD-10-CM | POA: Diagnosis not present

## 2023-10-09 DIAGNOSIS — R6889 Other general symptoms and signs: Secondary | ICD-10-CM | POA: Diagnosis not present

## 2023-10-09 DIAGNOSIS — J9811 Atelectasis: Secondary | ICD-10-CM | POA: Diagnosis present

## 2023-10-09 DIAGNOSIS — Z9221 Personal history of antineoplastic chemotherapy: Secondary | ICD-10-CM

## 2023-10-09 DIAGNOSIS — Z87891 Personal history of nicotine dependence: Secondary | ICD-10-CM

## 2023-10-09 DIAGNOSIS — Z515 Encounter for palliative care: Secondary | ICD-10-CM

## 2023-10-09 DIAGNOSIS — Z794 Long term (current) use of insulin: Secondary | ICD-10-CM | POA: Diagnosis not present

## 2023-10-09 DIAGNOSIS — F0280 Dementia in other diseases classified elsewhere without behavioral disturbance: Secondary | ICD-10-CM | POA: Diagnosis present

## 2023-10-09 DIAGNOSIS — Z7902 Long term (current) use of antithrombotics/antiplatelets: Secondary | ICD-10-CM

## 2023-10-09 DIAGNOSIS — Z833 Family history of diabetes mellitus: Secondary | ICD-10-CM

## 2023-10-09 DIAGNOSIS — J910 Malignant pleural effusion: Secondary | ICD-10-CM | POA: Diagnosis present

## 2023-10-09 DIAGNOSIS — E1151 Type 2 diabetes mellitus with diabetic peripheral angiopathy without gangrene: Secondary | ICD-10-CM | POA: Diagnosis present

## 2023-10-09 DIAGNOSIS — I10 Essential (primary) hypertension: Secondary | ICD-10-CM | POA: Diagnosis present

## 2023-10-09 DIAGNOSIS — C3490 Malignant neoplasm of unspecified part of unspecified bronchus or lung: Secondary | ICD-10-CM | POA: Diagnosis not present

## 2023-10-09 DIAGNOSIS — N179 Acute kidney failure, unspecified: Secondary | ICD-10-CM | POA: Diagnosis present

## 2023-10-09 DIAGNOSIS — Z7401 Bed confinement status: Secondary | ICD-10-CM | POA: Diagnosis not present

## 2023-10-09 DIAGNOSIS — J439 Emphysema, unspecified: Secondary | ICD-10-CM | POA: Diagnosis not present

## 2023-10-09 DIAGNOSIS — D509 Iron deficiency anemia, unspecified: Secondary | ICD-10-CM | POA: Diagnosis present

## 2023-10-09 DIAGNOSIS — Z743 Need for continuous supervision: Secondary | ICD-10-CM | POA: Diagnosis not present

## 2023-10-09 DIAGNOSIS — R222 Localized swelling, mass and lump, trunk: Secondary | ICD-10-CM | POA: Diagnosis not present

## 2023-10-09 DIAGNOSIS — R739 Hyperglycemia, unspecified: Secondary | ICD-10-CM | POA: Diagnosis not present

## 2023-10-09 DIAGNOSIS — Z993 Dependence on wheelchair: Secondary | ICD-10-CM

## 2023-10-09 DIAGNOSIS — Z886 Allergy status to analgesic agent status: Secondary | ICD-10-CM

## 2023-10-09 DIAGNOSIS — F0390 Unspecified dementia without behavioral disturbance: Secondary | ICD-10-CM | POA: Diagnosis present

## 2023-10-09 DIAGNOSIS — R0602 Shortness of breath: Secondary | ICD-10-CM | POA: Diagnosis not present

## 2023-10-09 LAB — COMPREHENSIVE METABOLIC PANEL
ALT: 24 U/L (ref 0–44)
AST: 29 U/L (ref 15–41)
Albumin: 2.2 g/dL — ABNORMAL LOW (ref 3.5–5.0)
Alkaline Phosphatase: 89 U/L (ref 38–126)
Anion gap: 13 (ref 5–15)
BUN: 43 mg/dL — ABNORMAL HIGH (ref 8–23)
CO2: 22 mmol/L (ref 22–32)
Calcium: 9.1 mg/dL (ref 8.9–10.3)
Chloride: 105 mmol/L (ref 98–111)
Creatinine, Ser: 1.32 mg/dL — ABNORMAL HIGH (ref 0.44–1.00)
GFR, Estimated: 42 mL/min — ABNORMAL LOW (ref >60)
Glucose, Bld: 253 mg/dL — ABNORMAL HIGH (ref 70–99)
Potassium: 4.9 mmol/L (ref 3.5–5.1)
Sodium: 140 mmol/L (ref 135–145)
Total Bilirubin: 0.6 mg/dL (ref 0.0–1.2)
Total Protein: 7.4 g/dL (ref 6.5–8.1)

## 2023-10-09 LAB — CBC WITH DIFFERENTIAL/PLATELET
Abs Immature Granulocytes: 0 10*3/uL (ref 0.00–0.07)
Basophils Absolute: 0.1 10*3/uL (ref 0.0–0.1)
Basophils Relative: 1 %
Eosinophils Absolute: 0 10*3/uL (ref 0.0–0.5)
Eosinophils Relative: 0 %
HCT: 21.9 % — ABNORMAL LOW (ref 36.0–46.0)
Hemoglobin: 6.5 g/dL — CL (ref 12.0–15.0)
Lymphocytes Relative: 15 %
Lymphs Abs: 1.6 10*3/uL (ref 0.7–4.0)
MCH: 18.1 pg — ABNORMAL LOW (ref 26.0–34.0)
MCHC: 29.7 g/dL — ABNORMAL LOW (ref 30.0–36.0)
MCV: 61 fL — ABNORMAL LOW (ref 80.0–100.0)
Monocytes Absolute: 0.3 10*3/uL (ref 0.1–1.0)
Monocytes Relative: 3 %
Neutro Abs: 8.7 10*3/uL — ABNORMAL HIGH (ref 1.7–7.7)
Neutrophils Relative %: 81 %
Platelets: 466 10*3/uL — ABNORMAL HIGH (ref 150–400)
RBC: 3.59 MIL/uL — ABNORMAL LOW (ref 3.87–5.11)
RDW: 20.9 % — ABNORMAL HIGH (ref 11.5–15.5)
WBC: 10.8 10*3/uL — ABNORMAL HIGH (ref 4.0–10.5)
nRBC: 1.3 % — ABNORMAL HIGH (ref 0.0–0.2)
nRBC: 4 /100{WBCs} — ABNORMAL HIGH

## 2023-10-09 LAB — PROTIME-INR
INR: 1.2 (ref 0.8–1.2)
Prothrombin Time: 15.2 s (ref 11.4–15.2)

## 2023-10-09 LAB — PREPARE RBC (CROSSMATCH)

## 2023-10-09 LAB — I-STAT CG4 LACTIC ACID, ED
Lactic Acid, Venous: 2.5 mmol/L (ref 0.5–1.9)
Lactic Acid, Venous: 2.3 mmol/L (ref 0.5–1.9)

## 2023-10-09 LAB — RESP PANEL BY RT-PCR (RSV, FLU A&B, COVID)  RVPGX2
Influenza A by PCR: NEGATIVE
Influenza B by PCR: NEGATIVE
Resp Syncytial Virus by PCR: NEGATIVE
SARS Coronavirus 2 by RT PCR: NEGATIVE

## 2023-10-09 MED ORDER — SODIUM CHLORIDE 0.9 % IV BOLUS
500.0000 mL | Freq: Once | INTRAVENOUS | Status: AC
  Administered 2023-10-09: 500 mL via INTRAVENOUS

## 2023-10-09 MED ORDER — SODIUM CHLORIDE 0.9% IV SOLUTION: Freq: Once | INTRAVENOUS | Status: DC

## 2023-10-09 MED ORDER — OXYCODONE HCL 5 MG PO TABS: 5.0000 mg | ORAL_TABLET | ORAL | Status: DC | PRN

## 2023-10-09 MED ORDER — LACTATED RINGERS IV BOLUS
500.0000 mL | Freq: Once | INTRAVENOUS | Status: AC
  Administered 2023-10-10: 500 mL via INTRAVENOUS

## 2023-10-09 MED ORDER — ONDANSETRON HCL 4 MG PO TABS: 4.0000 mg | ORAL_TABLET | Freq: Four times a day (QID) | ORAL | Status: DC | PRN

## 2023-10-09 MED ORDER — SODIUM CHLORIDE 0.9% IV SOLUTION: Freq: Once | INTRAVENOUS | Status: AC

## 2023-10-09 MED ORDER — PANTOPRAZOLE SODIUM 40 MG PO TBEC
40.0000 mg | DELAYED_RELEASE_TABLET | Freq: Two times a day (BID) | ORAL | Status: DC
  Administered 2023-10-10 – 2023-10-14 (×8): 40 mg via ORAL
  Filled 2023-10-09 (×10): qty 1

## 2023-10-09 MED ORDER — ACETAMINOPHEN 325 MG PO TABS
650.0000 mg | ORAL_TABLET | Freq: Four times a day (QID) | ORAL | Status: DC | PRN
  Administered 2023-10-12: 650 mg via ORAL
  Filled 2023-10-09: qty 2

## 2023-10-09 MED ORDER — IOHEXOL 350 MG/ML SOLN
75.0000 mL | Freq: Once | INTRAVENOUS | Status: AC | PRN
  Administered 2023-10-09: 75 mL via INTRAVENOUS

## 2023-10-09 MED ORDER — SODIUM CHLORIDE 0.9 % IV SOLN
2.0000 g | Freq: Once | INTRAVENOUS | Status: AC
  Administered 2023-10-09: 2 g via INTRAVENOUS
  Filled 2023-10-09: qty 20

## 2023-10-09 MED ORDER — ORAL CARE MOUTH RINSE: 15.0000 mL | OROMUCOSAL | Status: DC | PRN

## 2023-10-09 MED ORDER — ATORVASTATIN CALCIUM 80 MG PO TABS
80.0000 mg | ORAL_TABLET | Freq: Every day | ORAL | Status: DC
  Administered 2023-10-10 – 2023-10-13 (×4): 80 mg via ORAL
  Filled 2023-10-09 (×4): qty 1

## 2023-10-09 MED ORDER — GABAPENTIN 300 MG PO CAPS
300.0000 mg | ORAL_CAPSULE | Freq: Three times a day (TID) | ORAL | Status: DC
  Administered 2023-10-10 – 2023-10-14 (×12): 300 mg via ORAL
  Filled 2023-10-09 (×14): qty 1

## 2023-10-09 MED ORDER — CLOPIDOGREL BISULFATE 75 MG PO TABS
75.0000 mg | ORAL_TABLET | Freq: Every day | ORAL | Status: DC
  Administered 2023-10-10 – 2023-10-14 (×4): 75 mg via ORAL
  Filled 2023-10-09 (×5): qty 1

## 2023-10-09 MED ORDER — AMLODIPINE BESYLATE 5 MG PO TABS
5.0000 mg | ORAL_TABLET | Freq: Every morning | ORAL | Status: DC
  Filled 2023-10-09: qty 1

## 2023-10-09 MED ORDER — PIPERACILLIN-TAZOBACTAM 3.375 G IVPB 30 MIN
3.3750 g | Freq: Once | INTRAVENOUS | Status: AC
  Administered 2023-10-10: 3.375 g via INTRAVENOUS
  Filled 2023-10-09: qty 50

## 2023-10-09 MED ORDER — ACETAMINOPHEN 650 MG RE SUPP: 650.0000 mg | Freq: Four times a day (QID) | RECTAL | Status: DC | PRN

## 2023-10-09 MED ORDER — ONDANSETRON HCL 4 MG/2ML IJ SOLN
4.0000 mg | Freq: Four times a day (QID) | INTRAMUSCULAR | Status: DC | PRN
Start: 2023-10-09 — End: 2023-10-14

## 2023-10-09 MED ORDER — PIPERACILLIN-TAZOBACTAM 3.375 G IVPB
3.3750 g | Freq: Three times a day (TID) | INTRAVENOUS | Status: DC
  Administered 2023-10-10 – 2023-10-13 (×11): 3.375 g via INTRAVENOUS
  Filled 2023-10-09 (×11): qty 50

## 2023-10-09 MED ORDER — ENOXAPARIN SODIUM 40 MG/0.4ML IJ SOSY
40.0000 mg | PREFILLED_SYRINGE | INTRAMUSCULAR | Status: DC
  Administered 2023-10-10: 40 mg via SUBCUTANEOUS
  Filled 2023-10-09: qty 0.4

## 2023-10-09 MED ORDER — SODIUM CHLORIDE 0.9 % IV SOLN
500.0000 mg | Freq: Once | INTRAVENOUS | Status: AC
  Administered 2023-10-09: 500 mg via INTRAVENOUS
  Filled 2023-10-09: qty 5

## 2023-10-09 NOTE — ED Provider Notes (Signed)
 [place] EMERGENCY DEPARTMENT AT [facility] Provider Note   CSN: [id number] Arrival date & time: 10/09/23  1519     History  Chief Complaint  Patient presents with   Altered Mental Status    Crystal Moore is a 78 y.o. female.   Altered Mental Status Patient brought in for mental status change.  Does have some baseline dementia and previous stroke.  Also has known lung cancer that patient was not able to get aggressive treatment for.  Reportedly had sats of the 70s for EMS.  Is on oxygen at baseline.  More hypoxic here.  Does have some hypotension.  Reportedly has been having some choking episodes.  Also not having all her breathing treatments.  Patient cannot provide me with much history however.    Past Medical History:  Diagnosis Date   Abnormal gait    wheelchair bound   Anemia 06/24/2022   IDA   Arthritis    COPD (chronic obstructive pulmonary disease) (HCC)    COVID-19 virus infection 07/2020   CVA (cerebral vascular accident) (HCC) 09/22/2022   with residual left-sided weakness   Dementia (HCC)    Depression    no current problem   Diabetes mellitus without complication (HCC)    GERD (gastroesophageal reflux disease)    Headache    Hemiparesis affecting left side as late effect of cerebrovascular accident (CVA) (HCC)    w/c bound   HOH (hard of hearing) 01/13/2018   no hearing aids   Hypertension    Mass of lung    bilateral masses   PAD (peripheral artery disease) (HCC)    in CE   Pneumonia    x 4   Pyelonephritis    Seizures (HCC) 09/25/2022   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   Sensorineural hearing loss    bilateral in CE, no hearing aids   Stroke [facility])    Tobacco use disorder 2019   quit 2019   UTI (urinary tract infection)    Wheelchair dependent     Home Medications Prior to Admission medications   Medication Sig Start Date End Date Taking? Authorizing Provider  ACCU-CHEK GUIDE test strip USE TO test  AS DIRECTED TWICE DAILY Patient taking differently: 1 each by Other route 2 (two) times daily. 05/23/21   Romero Belling, MD  Accu-Chek Softclix Lancets lancets 1 each by Other route daily. 08/26/22   [provider]  acetaminophen (TYLENOL) 500 MG tablet Take 1,000 mg by mouth every 6 (six) hours as needed (pain).    [provider]  albuterol (VENTOLIN HFA) 108 (90 Base) MCG/ACT inhaler Inhale 2 puffs into the lungs every 6 (six) hours as needed for wheezing or shortness of breath.    [provider]  amLODipine (NORVASC) 5 MG tablet Take 5 mg by mouth in the morning. 05/30/21   [provider]  atorvastatin (LIPITOR) 80 MG tablet Take 1 tablet (80 mg total) by mouth daily at 6 PM. Patient taking differently: Take 80 mg by mouth at bedtime. 12/21/18   Mikhail, Nita Sells, DO  cephALEXin (KEFLEX) 500 MG capsule Take 1 capsule (500 mg total) by mouth 2 (two) times daily. 05/05/23   Rhyne, Ames Coupe, PA-C  clopidogrel (PLAVIX) 75 MG tablet Take 1 tablet (75 mg total) by mouth daily. 09/24/20   Leonie Douglas, MD  docusate sodium (COLACE) 100 MG capsule Take 1 capsule (100 mg total) by mouth daily. 04/03/23   Dorcas Carrow, MD  feeding  supplement (ENSURE ENLIVE / ENSURE PLUS) LIQD Take 237 mLs by mouth 2 (two) times daily between meals. 06/28/20   Romero Belling, MD  gabapentin (NEURONTIN) 300 MG capsule Take 1 capsule (300 mg total) by mouth 3 (three) times daily. 09/06/20   Kathlen Mody, MD  insulin glargine (LANTUS) 100 UNIT/ML injection Inject 0.1 mLs (10 Units total) into the skin daily. Patient taking differently: Inject 10 Units into the skin in the morning. 02/27/23   Rodolph Bong, MD  linagliptin (TRADJENTA) 5 MG TABS tablet Take 5 mg by mouth daily.    [provider]  pantoprazole (PROTONIX) 40 MG tablet Take 40 mg by mouth 2 (two) times daily.    [provider]  SANTYL 250 UNIT/GM ointment Apply 1 Application topically See admin  instructions. Apply as directed to affected areas of both feet daily as directed 10/23/22   [provider]  Silver [facility]) 4"X4" PADS Apply 1 each topically daily. 04/02/23   Dorcas Carrow, MD      Allergies    Aspirin    Review of Systems   Review of Systems  Physical Exam Updated Vital Signs BP 117/63 (BP Location: Left Arm)   Pulse 91   Temp (!) 97.1 F (36.2 C) (Oral)   Resp 17   Ht 5\' 2"  (1.575 m)   Wt 60.6 kg   SpO2 96%   BMI 24.44 kg/m  Physical Exam Vitals and nursing note reviewed.  Pulmonary:     Comments: Somewhat harsh breath sounds. Abdominal:     Tenderness: There is no abdominal tenderness.  Musculoskeletal:     Cervical back: Neck supple.     Comments: Previous bilateral above-the-knee amputation.  Neurological:     Mental Status: She is alert.     Comments:  nonverbal.  Will nod yes or no to questions.     ED Results / Procedures / Treatments   Labs (all labs ordered are listed, but only abnormal results are displayed) Labs Reviewed  COMPREHENSIVE METABOLIC PANEL - Abnormal; Notable for the following components:      Result Value   Glucose, Bld 253 (*)    BUN 43 (*)    Creatinine, Ser 1.32 (*)    Albumin 2.2 (*)    GFR, Estimated 42 (*)    All other components within normal limits  CBC WITH DIFFERENTIAL/PLATELET - Abnormal; Notable for the following components:   WBC 10.8 (*)    RBC 3.59 (*)    Hemoglobin 6.5 (*)    HCT 21.9 (*)    MCV 61.0 (*)    MCH 18.1 (*)    MCHC 29.7 (*)    RDW 20.9 (*)    Platelets 466 (*)    nRBC 1.3 (*)    Neutro Abs 8.7 (*)    nRBC 4 (*)    All other components within normal limits  I-STAT CG4 LACTIC ACID, ED - Abnormal; Notable for the following components:   Lactic Acid, Venous 2.5 (*)    All other components within normal limits  I-STAT CG4 LACTIC ACID, ED - Abnormal; Notable for the following components:   Lactic Acid, Venous 2.3 (*)    All other components within normal limits  RESP  PANEL BY RT-PCR (RSV, FLU A&B, COVID)  RVPGX2  CULTURE, BLOOD (ROUTINE X 2)  CULTURE, BLOOD (ROUTINE X 2)  PROTIME-INR  URINALYSIS, W/ REFLEX TO CULTURE (INFECTION SUSPECTED)  POC OCCULT BLOOD, ED    EKG None  Radiology CT  CHEST ABDOMEN PELVIS W CONTRAST Result Date: 10/09/2023 CLINICAL DATA:  Sepsis, altered mental status. Concern for metastatic disease. History of lung cancer. * Tracking Code: BO * EXAM: CT CHEST, ABDOMEN, AND PELVIS WITH CONTRAST TECHNIQUE: Multidetector CT imaging of the chest, abdomen and pelvis was performed following the standard protocol during bolus administration of intravenous contrast. RADIATION DOSE REDUCTION: This exam was performed according to the departmental dose-optimization program which includes automated exposure control, adjustment of the mA and/or kV according to patient size and/or use of iterative reconstruction technique. CONTRAST:  75mL OMNIPAQUE IOHEXOL 350 MG/ML SOLN COMPARISON:  CT chest 02/22/2023 FINDINGS: CT CHEST FINDINGS Cardiovascular: No acute pulmonary embolism. Aortic acute abnormality. No pericardial fluid. Mediastinum/Nodes: LEFT lung invades the mediastinum and compresses the LEFT mainstem bronchus. This obstruction of the LEFT bronchus is new from prior. Lungs/Pleura: New obstruction of the LEFT mainstem bronchus with complete collapse of the LEFT upper lobe and complete collapse of LEFT lower lobe. The lingular lobe is aerated. There is a enhancing mass within the collapsed LEFT upper lobe measuring 6.9 cm (image 22/3. Enhancing mass within the LEFT lower lobe measuring 3.7 cm. Large LEFT effusion associated with new bronchial obstruction In the RIGHT lung upper lobe mass measures 5.3 cm increased from 3.8 cm. Musculoskeletal: No aggressive osseous lesion. CT ABDOMEN AND PELVIS FINDINGS Hepatobiliary: No focal hepatic lesion. No biliary ductal dilatation. Gallbladder is normal. Common bile duct is normal. Pancreas: Pancreas is normal. No  ductal dilatation. No pancreatic inflammation. Spleen: Normal spleen Adrenals/urinary tract: Adrenal glands and kidneys are normal. The ureters and bladder normal. Stomach/Bowel: Stomach, small bowel, appendix, and cecum are normal. The colon and rectosigmoid colon are normal. Vascular/Lymphatic: Abdominal aorta is normal caliber. There is no retroperitoneal or periportal lymphadenopathy. No pelvic lymphadenopathy. Reproductive: Other: No free fluid. Musculoskeletal: No aggressive osseous lesion. IMPRESSION: CHEST: 1. 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 Large LEFT effusion. 2. Enhancing masses in the LEFT upper lobe, LEFT lower lobe, and RIGHT upper lobe consistent with lung cancer. Masses are increased in size from CT 02/22/2023. 3. Invasion of the mediastinum by the LEFT lung mass. PELVIS: No evidence of metastatic disease in the abdomen pelvis. Electronically Signed   By: Genevive Bi M.D.   On: 10/09/2023 20:02   DG Chest Portable 1 View Result Date: 10/09/2023 CLINICAL DATA:  Shortness of breath EXAM: PORTABLE CHEST 1 VIEW COMPARISON:  02/24/2023. FINDINGS: Right upper lobe mass again noted, larger than prior study. Complete opacification of the left upper hemithorax. Dense consolidation in the left lower lobe. Suspect small left pleural effusion. Heart is borderline enlarged. Diffuse interstitial opacities throughout the lungs are similar to prior study. IMPRESSION: Enlarging right upper lobe mass. Dense consolidation in the left lower lobe concerning for pneumonia. Complete opacification of the left upper hemithorax could reflect pneumonia. Cannot exclude postobstructive process or mass. Electronically Signed   By: Charlett Nose M.D.   On: 10/09/2023 17:25    Procedures Procedures    Medications Ordered in ED Medications  cefTRIAXone (ROCEPHIN) 2 g in sodium chloride 0.9 % 100 mL IVPB (0 g Intravenous Stopped  10/09/23 1752)  azithromycin (ZITHROMAX) 500 mg in sodium chloride 0.9 % 250 mL IVPB (0 mg Intravenous Stopped 10/09/23 1905)  sodium chloride 0.9 % bolus 500 mL (0 mLs Intravenous Stopped 10/09/23 1914)  iohexol (OMNIPAQUE) 350 MG/ML injection 75 mL (75 mLs Intravenous Contrast Given 10/09/23 1946)  ED Course/ Medical Decision Making/ A&P                                 Medical Decision Making Amount and/or Complexity of Data Reviewed Labs: ordered. Radiology: ordered.  Risk Prescription drug management. Decision regarding hospitalization.   Patient with mental status change.  Hyperglycemia.  Differential diagnose includes infection, with hypoxia pneumonia considered.  Also has known lung cancer that is not amenable to severe treatment. Lactic acid mildly elevated.  Afebrile.  Oxygenation improved after breathing treatment and on oxygen.  Creatinine mildly elevated.  Hemoglobin is 6.  Chest x-ray independently interpreted and shows worsening of mass and whiteout of left upper lobe.  Infection versus postobstructive changes.  Will get CT scan to further evaluate but patient will require admission to hospital.  CRITICAL CARE Performed by: Benjiman Core Total critical care time: 30 minutes Critical care time was exclusive of separately billable procedures and treating other patients. Critical care was necessary to treat or prevent imminent or life-threatening deterioration. Critical care was time spent personally by me on the following activities: development of treatment plan with patient and/or surrogate as well as nursing, discussions with consultants, evaluation of patient's response to treatment, examination of patient, obtaining history from patient or surrogate, ordering and performing treatments and interventions, ordering and review of laboratory studies, ordering and review of radiographic studies, pulse oximetry and re-evaluation of patient's condition.  Discussed with  patient's daughter at bedside.         Final Clinical Impression(s) / ED Diagnoses Final diagnoses:  Pneumonia of left lung due to infectious organism, unspecified part of lung  Metastatic malignant neoplasm, unspecified site [facility])    Rx / DC Orders ED Discharge Orders     None         Benjiman Core, MD 10/09/23 2139

## 2023-10-09 NOTE — Progress Notes (Signed)
 Pharmacy Antibiotic Note  Crystal Moore is a 78 y.o. female admitted on 10/09/2023 with pneumonia.  Pharmacy has been consulted for Zosyn dosing.  Plan: Zosyn 3.375g IV q8h (4 hour infusion).  Height: 5\' 2"  (157.5 cm) Weight: 60.6 kg (133 lb 9.6 oz) IBW/kg (Calculated) : 50.1  Temp (24hrs), Avg:97.8 F (36.6 C), Min:97.1 F (36.2 C), Max:98.4 F (36.9 C)  Recent Labs  Lab 10/09/23 1610 10/09/23 1619 10/09/23 1757  WBC 10.8*  --   --   CREATININE 1.32*  --   --   LATICACIDVEN  --  2.5* 2.3*    Estimated Creatinine Clearance: 30.6 mL/min (A) (by C-G formula based on SCr of 1.32 mg/dL (H)).    Allergies  Allergen Reactions   Aspirin Other (See Comments)    "Cannot take due to previous GI bleed," per daughter      Thank you for allowing pharmacy to be a part of this patient's care.  Vernard Gambles, PharmD, BCPS  10/09/2023 11:12 PM

## 2023-10-09 NOTE — ED Triage Notes (Signed)
 Pt here via [facility] after family called for altered mental status and elevated blood sugar.  Family states pt mentation has been steadily declining since being dx with laryngitis 2 weeks prior. Pt does have dementia, but is normally able to carry on a conversation.  Presently, pt is alert but not speaking.  Permanent L sided deficits - complete paralysis.  Initial sats by Fire were 70%.  Placed on 12 L that improved sats to 96%.  Given. 5/0.5 duoneb.  96% on nrb 90/60 bp 98 hr nsr 22 rr 368 cbg  Room air check on arrival - 87%

## 2023-10-09 NOTE — H&P (Incomplete)
 History and Physical    Crystal Moore YQM:[id number] DOB: 06/13/1946 DOA: 10/09/2023  PCP: Lorenda Ishihara, MD   Chief Complaint:  ams  HPI: Crystal Moore is a 78 y.o. female with medical history significant of iron deficiency anemia, COPD, prior CVA, depression, dementia, hypertension who presents emergency department with mental status changes.  Patient has baseline dementia from a previous stroke.  Also has known lung cancer not on active treatment.  She was reportedly found to be hypoxic satting in the 70s.  EMS was called and she was transported to the ER for further assessment.  On arrival she was altered and unable to provide meaningful history.  Labs were obtained on presentation which showed respiratory viral panel negative, creatinine 1.3 baseline 0.8, glucose 253, WBC 10.8, hemoglobin 6.5, platelets 466, lactic acid 2.5, 2.3.  Patient underwent chest x-ray which showed consolidation of left lower lobe concerning for pneumonia complete opacification of the left hemithorax.  CT chest abdomen pelvis demonstrated complete obstruction of left main bronchus with complete collapse and a large left effusion.  Patient was started on antibiotics and admitted for further workup.   Review of Systems: Review of Systems  Constitutional: Negative.  Negative for chills, fever, malaise/fatigue and weight loss.  HENT: Negative.    Eyes: Negative.   Respiratory:  Positive for cough and shortness of breath.   Cardiovascular: Negative.   Gastrointestinal: Negative.   Genitourinary: Negative.   Musculoskeletal: Negative.   Skin: Negative.   Neurological: Negative.   Endo/Heme/Allergies: Negative.   Psychiatric/Behavioral: Negative.       As per HPI otherwise 10 point review of systems negative.   Allergies  Allergen Reactions   Aspirin Other (See Comments)    "Cannot take due to previous GI bleed," per daughter      Past Medical History:  Diagnosis Date   Abnormal gait     wheelchair bound   Anemia 06/24/2022   IDA   Arthritis    COPD (chronic obstructive pulmonary disease) (HCC)    COVID-19 virus infection 07/2020   CVA (cerebral vascular accident) (HCC) 09/22/2022   with residual left-sided weakness   Dementia (HCC)    Depression    no current problem   Diabetes mellitus without complication (HCC)    GERD (gastroesophageal reflux disease)    Headache    Hemiparesis affecting left side as late effect of cerebrovascular accident (CVA) (HCC)    w/c bound   HOH (hard of hearing) 01/13/2018   no hearing aids   Hypertension    Mass of lung    bilateral masses   PAD (peripheral artery disease) (HCC)    in CE   Pneumonia    x 4   Pyelonephritis    Seizures (HCC) 09/25/2022   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   Sensorineural hearing loss    bilateral in CE, no hearing aids   Stroke [facility])    Tobacco use disorder 2019   quit 2019   UTI (urinary tract infection)    Wheelchair dependent     Past Surgical History:  Procedure Laterality Date   ABDOMINAL AORTOGRAM W/LOWER EXTREMITY N/A 08/30/2020   Procedure: ABDOMINAL AORTOGRAM W/LOWER EXTREMITY;  Surgeon: Leonie Douglas, MD;  Location: [facility];  Service: Cardiovascular;  Laterality: N/A;   ABDOMINAL AORTOGRAM W/LOWER EXTREMITY N/A 09/04/2020   Procedure: ABDOMINAL AORTOGRAM W/LOWER EXTREMITY;  Surgeon: Leonie Douglas, MD;  Location: [facility];  Service: Cardiovascular;  Laterality: N/A;  AMPUTATION Right 03/30/2023   Procedure: RIGHT AMPUTATION ABOVE KNEE;  Surgeon: Victorino Sparrow, MD;  Location: [facility] OR;  Service: Vascular;  Laterality: Right;   ESOPHAGOGASTRODUODENOSCOPY (EGD) WITH PROPOFOL N/A 09/16/2020   Procedure: ESOPHAGOGASTRODUODENOSCOPY (EGD) WITH PROPOFOL;  Surgeon: Shellia Cleverly, DO;  Location: [facility] ENDOS[facility];  Service: Gastroenterology;  Laterality: N/A;   FINE NEEDLE ASPIRATION  10/27/2022   Procedure: FINE NEEDLE ASPIRATION (FNA) LINEAR;   Surgeon: Josephine Igo, DO;  Location: [facility] ENDOS[facility];  Service: Pulmonary;;   HEMOSTASIS CLIP PLACEMENT  09/16/2020   Procedure: HEMOSTASIS CLIP PLACEMENT;  Surgeon: Shellia Cleverly, DO;  Location: [facility];  Service: Gastroenterology;;   HOT HEMOSTASIS N/A 09/16/2020   Procedure: HOT HEMOSTASIS (ARGON PLASMA COAGULATION/BICAP);  Surgeon: Shellia Cleverly, DO;  Location: [facility] ENDOSCOPY;  Service: Gastroenterology;  Laterality: N/A;   PERIPHERAL VASCULAR INTERVENTION Right 08/30/2020   Procedure: PERIPHERAL VASCULAR INTERVENTION;  Surgeon: Leonie Douglas, MD;  [facility]: [facility];  Service: Cardiovascular;  Laterality: Right;  femoral popliteal   PERIPHERAL VASCULAR INTERVENTION Left 09/04/2020   Procedure: PERIPHERAL VASCULAR INTERVENTION;  Surgeon: Leonie Douglas, MD;  Location: [facility];  Service: Cardiovascular;  Laterality: Left;  SFA   TUBAL LIGATION     VIDEO BRONCHOSCOPY WITH ENDOBRONCHIAL ULTRASOUND Bilateral 10/27/2022   Procedure: VIDEO BRONCHOSCOPY WITH ENDOBRONCHIAL ULTRASOUND;  Surgeon: Josephine Igo, DO;  Location: [facility];  Service: Pulmonary;  Laterality: Bilateral;     reports that she quit smoking about 6 years ago. Her smoking use included cigarettes. She started smoking about 46 years ago. She has a 40 pack-year smoking history. She has never used smokeless tobacco. She reports that she does not drink alcohol and does not use drugs.  Family History  Problem Relation Age of Onset   Diabetes Mother    Diabetes Sister    Diabetes Brother     Prior to Admission medications   Medication Sig Start Date End Date Taking? Authorizing Provider  ACCU-CHEK GUIDE test strip USE TO test AS DIRECTED TWICE DAILY Patient taking differently: 1 each by Other route 2 (two) times daily. 05/23/21   Romero Belling, MD  Accu-Chek Softclix Lancets lancets 1 each by Other route daily. 08/26/22   [provider]  acetaminophen (TYLENOL) 500 MG tablet Take  1,000 mg by mouth every 6 (six) hours as needed (pain).    [provider]  albuterol (VENTOLIN HFA) 108 (90 Base) MCG/ACT inhaler Inhale 2 puffs into the lungs every 6 (six) hours as needed for wheezing or shortness of breath.    [provider]  amLODipine (NORVASC) 5 MG tablet Take 5 mg by mouth in the morning. 05/30/21   [provider]  atorvastatin (LIPITOR) 80 MG tablet Take 1 tablet (80 mg total) by mouth daily at 6 PM. Patient taking differently: Take 80 mg by mouth at bedtime. 12/21/18   Mikhail, Nita Sells, DO  cephALEXin (KEFLEX) 500 MG capsule Take 1 capsule (500 mg total) by mouth 2 (two) times daily. 05/05/23   Rhyne, Ames Coupe, PA-C  clopidogrel (PLAVIX) 75 MG tablet Take 1 tablet (75 mg total) by mouth daily. 09/24/20   Leonie Douglas, MD  docusate sodium (COLACE) 100 MG capsule Take 1 capsule (100 mg total) by mouth daily. 04/03/23   Dorcas Carrow, MD  feeding supplement (ENSURE ENLIVE / ENSURE PLUS) LIQD Take 237 mLs by mouth 2 (two) times daily between meals. 06/28/20   Romero Belling, MD  gabapentin (NEURONTIN) 300 MG  capsule Take 1 capsule (300 mg total) by mouth 3 (three) times daily. 09/06/20   Kathlen Mody, MD  insulin glargine (LANTUS) 100 UNIT/ML injection Inject 0.1 mLs (10 Units total) into the skin daily. Patient taking differently: Inject 10 Units into the skin in the morning. 02/27/23   Rodolph Bong, MD  linagliptin (TRADJENTA) 5 MG TABS tablet Take 5 mg by mouth daily.    [provider]  pantoprazole (PROTONIX) 40 MG tablet Take 40 mg by mouth 2 (two) times daily.    [provider]  SANTYL 250 UNIT/GM ointment Apply 1 Application topically See admin instructions. Apply as directed to affected areas of both feet daily as directed 10/23/22   [provider]  Silver [facility]) 4"X4" PADS Apply 1 each topically daily. 04/02/23   Dorcas Carrow, MD    Physical Exam: Vitals:   10/09/23 1900 10/09/23 1915 10/09/23 2031  10/09/23 2100  BP: 92/64 98/62 117/63 112/71  Pulse: 88 86 91 87  Resp: 14 19 17 20   Temp:   (!) 97.1 F (36.2 C)   TempSrc:   Oral   SpO2: 100% 100% 96% 100%  Weight:      Height:       Physical Exam Constitutional:      Appearance: She is normal weight.  HENT:     Head: Normocephalic.     Nose: Nose normal.     Mouth/Throat:     Mouth: Mucous membranes are moist.     Pharynx: Oropharynx is clear.  Cardiovascular:     Rate and Rhythm: Normal rate.     Pulses: Normal pulses.     Heart sounds: Normal heart sounds.  Pulmonary:     Effort: Respiratory distress present.  Abdominal:     General: Abdomen is flat. Bowel sounds are normal.  Musculoskeletal:        General: Normal range of motion.     Cervical back: Normal range of motion.  Skin:    General: Skin is warm.     Capillary Refill: Capillary refill takes less than 2 seconds.  Neurological:     General: No focal deficit present.     Mental Status: She is alert.  Psychiatric:        Mood and Affect: Mood normal.        Thought Content: Thought content normal.        Labs on Admission: I have personally reviewed the patients's labs and imaging studies.  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  They have had discussions with palliative care over the last year and patient remains a full code.  Patient's daughter states that she does not want to make unilateral decisions and will consult with her family in the morning regarding treatment plan.  She  has not had active treatment for her cancer within the last 6 months. Plan: Continue IV Zosyn Order thoracentesis to assess for pleural effusion Cautious IV fluids Remain full code Consult pulmonology in morning.   #Acute on chronic anemia- transfusion 1 Unit  # Hypertension-continue amlodipine  # Prior stroke-continue Plavix  #  Chronic pain-continue gabapentin  # GERD-continue Protonix  #T2D- SSI    Admission status: Inpatient Telemetry Medical  Certification: The appropriate patient status for this patient is INPATIENT. Inpatient status is judged to be reasonable and necessary in order to provide the required intensity of service to ensure the patient's safety. The patient's presenting symptoms, physical exam findings, and initial radiographic and laboratory data in the context of their chronic comorbidities is felt to place them at high risk for further clinical deterioration. Furthermore, it is not anticipated that the patient will be medically stable for discharge from the hospital within 2 midnights of admission.   * I certify that at the point of admission it is my clinical judgment that the patient will require inpatient hospital care spanning beyond 2 midnights from the point of admission due to high intensity of service, high risk for further deterioration and high frequency of surveillance required.Alan Mulder MD [facility] If 7PM-7AM, please contact night-coverage [email or web address]  10/09/2023, 10:52 PM

## 2023-10-10 ENCOUNTER — Inpatient Hospital Stay (HOSPITAL_COMMUNITY)

## 2023-10-10 DIAGNOSIS — A419 Sepsis, unspecified organism: Secondary | ICD-10-CM | POA: Diagnosis not present

## 2023-10-10 DIAGNOSIS — J90 Pleural effusion, not elsewhere classified: Secondary | ICD-10-CM

## 2023-10-10 DIAGNOSIS — J9621 Acute and chronic respiratory failure with hypoxia: Secondary | ICD-10-CM

## 2023-10-10 DIAGNOSIS — E119 Type 2 diabetes mellitus without complications: Secondary | ICD-10-CM

## 2023-10-10 DIAGNOSIS — F0390 Unspecified dementia without behavioral disturbance: Secondary | ICD-10-CM

## 2023-10-10 DIAGNOSIS — I10 Essential (primary) hypertension: Secondary | ICD-10-CM

## 2023-10-10 DIAGNOSIS — R6520 Severe sepsis without septic shock: Secondary | ICD-10-CM

## 2023-10-10 DIAGNOSIS — C3490 Malignant neoplasm of unspecified part of unspecified bronchus or lung: Secondary | ICD-10-CM | POA: Diagnosis not present

## 2023-10-10 DIAGNOSIS — N179 Acute kidney failure, unspecified: Secondary | ICD-10-CM

## 2023-10-10 DIAGNOSIS — J189 Pneumonia, unspecified organism: Secondary | ICD-10-CM | POA: Diagnosis not present

## 2023-10-10 DIAGNOSIS — I69354 Hemiplegia and hemiparesis following cerebral infarction affecting left non-dominant side: Secondary | ICD-10-CM

## 2023-10-10 LAB — URINALYSIS, W/ REFLEX TO CULTURE (INFECTION SUSPECTED)
Bilirubin Urine: NEGATIVE
Glucose, UA: NEGATIVE mg/dL
Hgb urine dipstick: NEGATIVE
Ketones, ur: NEGATIVE mg/dL
Leukocytes,Ua: NEGATIVE
Nitrite: NEGATIVE
Protein, ur: NEGATIVE mg/dL
Specific Gravity, Urine: >1.046 — ABNORMAL HIGH (ref 1.005–1.030)
pH: 5 (ref 5.0–8.0)

## 2023-10-10 LAB — CBC
HCT: 23.5 % — ABNORMAL LOW (ref 36.0–46.0)
Hemoglobin: 7.2 g/dL — ABNORMAL LOW (ref 12.0–15.0)
MCH: 19.9 pg — ABNORMAL LOW (ref 26.0–34.0)
MCHC: 30.6 g/dL (ref 30.0–36.0)
MCV: 64.9 fL — ABNORMAL LOW (ref 80.0–100.0)
Platelets: 374 10*3/uL (ref 150–400)
RBC: 3.62 MIL/uL — ABNORMAL LOW (ref 3.87–5.11)
RDW: 25.3 % — ABNORMAL HIGH (ref 11.5–15.5)
WBC: 10.1 10*3/uL (ref 4.0–10.5)
nRBC: 0.9 % — ABNORMAL HIGH (ref 0.0–0.2)

## 2023-10-10 LAB — COMPREHENSIVE METABOLIC PANEL
ALT: 19 U/L (ref 0–44)
AST: 15 U/L (ref 15–41)
Albumin: 1.9 g/dL — ABNORMAL LOW (ref 3.5–5.0)
Alkaline Phosphatase: 74 U/L (ref 38–126)
Anion gap: 8 (ref 5–15)
BUN: 32 mg/dL — ABNORMAL HIGH (ref 8–23)
CO2: 22 mmol/L (ref 22–32)
Calcium: 8.5 mg/dL — ABNORMAL LOW (ref 8.9–10.3)
Chloride: 110 mmol/L (ref 98–111)
Creatinine, Ser: 1.01 mg/dL — ABNORMAL HIGH (ref 0.44–1.00)
GFR, Estimated: 57 mL/min — ABNORMAL LOW (ref >60)
Glucose, Bld: 90 mg/dL (ref 70–99)
Potassium: 4.5 mmol/L (ref 3.5–5.1)
Sodium: 140 mmol/L (ref 135–145)
Total Bilirubin: 0.4 mg/dL (ref 0.0–1.2)
Total Protein: 6.2 g/dL — ABNORMAL LOW (ref 6.5–8.1)

## 2023-10-10 LAB — GLUCOSE, CAPILLARY
Glucose-Capillary: 66 mg/dL — ABNORMAL LOW (ref 70–99)
Glucose-Capillary: 115 mg/dL — ABNORMAL HIGH (ref 70–99)
Glucose-Capillary: 121 mg/dL — ABNORMAL HIGH (ref 70–99)
Glucose-Capillary: 132 mg/dL — ABNORMAL HIGH (ref 70–99)
Glucose-Capillary: 135 mg/dL — ABNORMAL HIGH (ref 70–99)

## 2023-10-10 LAB — LACTIC ACID, PLASMA: Lactic Acid, Venous: 1.3 mmol/L (ref 0.5–1.9)

## 2023-10-10 MED ORDER — ORAL CARE MOUTH RINSE: 15.0000 mL | OROMUCOSAL | Status: DC | PRN

## 2023-10-10 MED ORDER — SODIUM CHLORIDE 0.9 % IV BOLUS
500.0000 mL | Freq: Once | INTRAVENOUS | Status: AC
  Administered 2023-10-10: 500 mL via INTRAVENOUS

## 2023-10-10 MED ORDER — ENOXAPARIN SODIUM 40 MG/0.4ML IJ SOSY
40.0000 mg | PREFILLED_SYRINGE | INTRAMUSCULAR | Status: DC
  Administered 2023-10-11 – 2023-10-13 (×3): 40 mg via SUBCUTANEOUS
  Filled 2023-10-10 (×3): qty 0.4

## 2023-10-10 MED ORDER — INSULIN ASPART 100 UNIT/ML IJ SOLN
0.0000 [IU] | Freq: Three times a day (TID) | INTRAMUSCULAR | Status: DC
Start: 2023-10-10 — End: 2023-10-14
  Administered 2023-10-10 (×2): 2 [IU] via SUBCUTANEOUS
  Administered 2023-10-11 (×2): 8 [IU] via SUBCUTANEOUS
  Administered 2023-10-12 (×2): 4 [IU] via SUBCUTANEOUS
  Administered 2023-10-13: 8 [IU] via SUBCUTANEOUS
  Administered 2023-10-13 – 2023-10-14 (×2): 2 [IU] via SUBCUTANEOUS
  Administered 2023-10-14: 8 [IU] via SUBCUTANEOUS

## 2023-10-10 NOTE — Assessment & Plan Note (Signed)
 Delirium precautions

## 2023-10-10 NOTE — Assessment & Plan Note (Addendum)
 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 Repeat CT yesterday seems like advancement of disease and now obstruction of left mainstem bronchus Not a candidate for any systemic therapy due to underlying poor functional status and comorbidities.  She was given 2 weeks of palliative radiation and advise to follow-up with hospice. She is still full code-daughter does not want to involve palliative and eventually agreed after a detailed explanation, does not seem like understanding the gravity or the reason completely. -Palliative care was consulted

## 2023-10-10 NOTE — Assessment & Plan Note (Signed)
 Patient is wheelchair and bedbound at baseline. -Continue home Plavix and statin

## 2023-10-10 NOTE — Assessment & Plan Note (Signed)
 CT scan with large left-sided pleural effusion likely malignant. Thoracentesis ordered-pending -Follow-up thoracentesis labs

## 2023-10-10 NOTE — Plan of Care (Signed)
   Problem: Clinical Measurements: Goal: Will remain free from infection Outcome: Progressing Goal: Diagnostic test results will improve Outcome: Progressing Goal: Respiratory complications will improve Outcome: Progressing Goal: Cardiovascular complication will be avoided Outcome: Progressing

## 2023-10-10 NOTE — Assessment & Plan Note (Signed)
CBG within goal. -Continue with SSI 

## 2023-10-10 NOTE — Assessment & Plan Note (Signed)
 With sepsis, seems improving. -Monitor renal function

## 2023-10-10 NOTE — Progress Notes (Signed)
 Pt transferred from ED to [id number] at 20:30. She is alert, oriented to self and her daughter, able to follow commands, on 4 LPM of O2 NCL, SPO2 100%, normal respiratory rate and effort, NSR on the monitor, HR 80s, BP stable, no obvious at arrival.    After Pt's daughter left the floor, we got an order for 1 unit of blood transfusion. We called and spoke with Pt's daughter, Ms. Chinita Greenland for an update and requested for a blood consent. She agreed and gave a verbal consent for blood transfusion. We have Dawn, Charity fundraiser as a witness for this verbal consent by phone. Blood transfusion started with no initial reaction. Her vital signs has been stable. We will continue to monitor.  Filiberto Pinks, RN

## 2023-10-10 NOTE — Hospital Course (Addendum)
 Taken from H&P.   Crystal Moore is a 78 y.o. female with medical history significant of iron deficiency anemia, COPD, prior CVA, depression, dementia, hypertension and stage IV lung cancer not on active treatment, who presents emergency department with mental status changes.  Patient has baseline dementia from a previous stroke.  She was reportedly found to be hypoxic in 70s and was placed on supplemental oxygen by EMS.  On arrival vital stable on 6 L of oxygen, labs with leukocytosis at a 10.8, hemoglobin 6.5, lactic acid 2.5>>2.3, creatinine 1.3 with baseline less than 1, respiratory panel negative.chest x-ray which showed consolidation of left lower lobe concerning for pneumonia complete opacification of the left hemithorax.  CT chest abdomen pelvis demonstrated complete obstruction of left main bronchus with complete collapse and a large left effusion.   Patient was started on Zosyn.  Remained full code. Seems like having a poor prognosis with worsening malignancy causing obstruction of left main bronchus. Left thoracentesis was ordered.  1 unit of PRBC ordered  3/16: Vital seems stable on 4 L of oxygen this morning, pending thoracentesis.  Consulting palliative care and pulmonology. Lactic acidosis resolved, hemoglobin improved to 7.2 s/p 1 unit of PRBC, improving leukocytosis, improving creatinine to 1.01, albumin 1.9, UA not consistent with UTI.  Patient with very poor underlying functional status. Per oncology note from last year not a candidate for any systemic therapy, they did palliative radiation for 2 weeks and suggested hospice. As patient still want her to be full code-ordered swallow evaluation as she was coughing on thin liquid.  3/17: Vitals currently stable on 4 L, thoracentesis again it got canceled as there was no family available for concern today.  Blood pressure improved.  Pending palliative care.  3/18: Hemodynamically stable, s/p thoracentesis today, labs and cytology  pending, likely malignant effusion.  Pending palliative care consult. Patient is appropriate for comfort care and hospice at home

## 2023-10-10 NOTE — Progress Notes (Signed)
 Progress Note   Patient: Crystal Moore ZOX:[id number] DOB: 02/18/1946 DOA: 10/09/2023     1 DOS: the patient was seen and examined on 10/10/2023   Brief hospital course: Taken from H&P.   ELISAVET BUEHRER is a 78 y.o. female with medical history significant of iron deficiency anemia, COPD, prior CVA, depression, dementia, hypertension and stage IV lung cancer not on active treatment, who presents emergency department with mental status changes.  Patient has baseline dementia from a previous stroke.  She was reportedly found to be hypoxic in 77s and was placed on supplemental oxygen by EMS.  On arrival vital stable on 6 L of oxygen, labs with leukocytosis at a 10.8, hemoglobin 6.5, lactic acid 2.5>>2.3, creatinine 1.3 with baseline less than 1, respiratory panel negative.chest x-ray which showed consolidation of left lower lobe concerning for pneumonia complete opacification of the left hemithorax.  CT chest abdomen pelvis demonstrated complete obstruction of left main bronchus with complete collapse and a large left effusion.   Patient was started on Zosyn.  Remained full code. Seems like having a poor prognosis with worsening malignancy causing obstruction of left main bronchus. Left thoracentesis was ordered.  1 unit of PRBC ordered  3/16: Vital seems stable on 4 L of oxygen this morning, pending thoracentesis.  Consulting palliative care and pulmonology. Lactic acidosis resolved, hemoglobin improved to 7.2 s/p 1 unit of PRBC, improving leukocytosis, improving creatinine to 1.01, albumin 1.9, UA not consistent with UTI.  Patient with very poor underlying functional status. Per oncology note from last year not a candidate for any systemic therapy, they did palliative radiation for 2 weeks and suggested hospice. As patient still want her to be full code-ordered swallow evaluation as she was coughing on thin liquid.  Assessment and Plan: * Postobstructive pneumonia Severe sepsis.  Likely with  postobstructive pneumonia with advanced lung malignancy.  Met sepsis criteria with leukocytosis, lactic acidosis and AKI. CT chest with malignant obstruction of left mainstem bronchus with complete collapse of left upper and lower lobe.  Large left pleural effusion. Lactic acidosis has been resolved.  Improving leukocytosis, preliminary blood cultures negative. -Continue with Zosyn -Continue with supportive care  Acute on chronic hypoxic respiratory failure (HCC) Patient uses 2 L at home, currently requiring 4 L. Likely due to collapse of left lung and large pleural effusion. -Continue supplemental oxygen-try weaning back to baseline as appropriate.  Pleural effusion CT scan with large left-sided pleural effusion likely malignant. Thoracentesis ordered-pending -Follow-up thoracentesis labs  AKI (acute kidney injury) (HCC) With sepsis, seems improving. 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 Repeat CT yesterday seems like advancement of disease and now obstruction of left mainstem bronchus Not a candidate for any systemic therapy due to underlying poor functional status and comorbidities.  She was given 2 weeks of palliative radiation and advise to follow-up with hospice. She is still full code-daughter does not want to involve palliative and eventually agreed after a detailed explanation, does not seem like understanding the gravity or the reason completely. -Palliative care was consulted  Diabetes mellitus without complication (HCC) CBG within goal -Continue with SSI  Essential hypertension Blood pressure borderline soft. -Holding home amlodipine  Hemiparesis affecting left side as late effect of cerebrovascular accident (CVA) (HCC) Patient is wheelchair and bedbound at baseline. -Continue home Plavix and statin  Dementia (HCC) -Delirium  precautions      Subjective: Patient  was seen and examined today.  She was coughing while drinking ginger ale with the help of daughter.  Patient with very poor underlying functional status.  Denies any pain.  Daughter knows her advanced lung cancer but not sure if she understands the gravity of illness as keeps saying that I do not want to involve palliative care and eventually agreed after multiple explanations.  She still want to her to be full code.  Physical Exam: Vitals:   10/10/23 0506 10/10/23 0756 10/10/23 1133 10/10/23 1200  BP: 114/69 92/70 114/67 105/67  Pulse: 85 84 90 90  Resp: 10 17 16 16   Temp: 97.7 F (36.5 C) 98 F (36.7 C) 97.7 F (36.5 C) 97.8 F (36.6 C)  TempSrc: Oral Axillary Oral Oral  SpO2: 98% 98% 97% 96%  Weight:      Height:       General.  Frail, lethargic and malnourished elderly lady, in no acute distress. Pulmonary.  Decreased breath sound on the left, rhonchi on right CV.  Regular rate and rhythm, no JVD, rub or murmur. Abdomen.  Soft, nontender, nondistended, BS positive. CNS.  Alert and oriented to self.  Left hemiparesis Extremities.  Right AKA, no edema on left  Data Reviewed: Prior data reviewed  Family Communication: Discussed with daughter at bedside  Disposition: Status is: Inpatient Remains inpatient appropriate because: Severity of illness  Planned Discharge Destination:  To be determined  DVT prophylaxis.  Lovenox Time spent: 50 minutes  This record has been created using Conservation officer, historic buildings. Errors have been sought and corrected,but may not always be located. Such creation errors do not reflect on the standard of care.   Author: Arnetha Courser, MD 10/10/2023 1:56 PM  For on call review [email or web address].

## 2023-10-10 NOTE — Progress Notes (Signed)
 Patient ID: Crystal Moore, female   DOB: Feb 14, 1946, 78 y.o.   MRN: [id number] Pt presented to [place] dept today for therapeutic left thoracentesis. Prior to procedure we were getting multiple systolic BP readings in 70's ; [facility] notified. Will hold on thoracentesis today and plan for tomorrow as long as BP higher and stable.

## 2023-10-10 NOTE — Assessment & Plan Note (Addendum)
 Severe sepsis.  Likely with postobstructive pneumonia with advanced lung malignancy.  Met sepsis criteria with leukocytosis, lactic acidosis and AKI. CT chest with malignant obstruction of left mainstem bronchus with complete collapse of left upper and lower lobe.  Large left pleural effusion. Lactic acidosis has been resolved.  Improving leukocytosis, preliminary blood cultures negative. -Continue with Zosyn -Continue with supportive care

## 2023-10-10 NOTE — Assessment & Plan Note (Signed)
 Patient uses 2 L at home, currently requiring 4 L. Likely due to collapse of left lung and large pleural effusion. -Continue supplemental oxygen-try weaning back to baseline as appropriate.

## 2023-10-10 NOTE — Consult Note (Signed)
 NAME:  Crystal Moore, MRN:  [id number], DOB:  [birth year], LOS: 1 ADMISSION DATE:  10/09/2023, CONSULTATION DATE:  10/10/2023 REFERRING MD:  Dr. Nelson Chimes - [facility], CHIEF COMPLAINT:  Lung mass    History of Present Illness:  Crystal Moore is a 78 y.o. female with an extensive past medical history significant for but not limited to stage IV squamous cell lung cancer, COPD, tobacco abuse, gastric AVM prior CVA, type 2 diabetes, HTN, arthritis presented to the ED this admission for altered mental status.    Of note patient was seen by oncology April 2024 for discussion on treatment of stage IV squamous cell lung cancer, at that time per chart review it appears patient and family had elected for palliative radiation only.  She was not a candidate for systemic chemotherapy due to poor performance status.  Recommendations were made to proceed with hospice care post palliative radiation.  Chest x-ray showed worsening opacity left upper lobe with left effusion. CT chest/abdomen/pelvis showed complete obstruction of left main bronchus with atelectasis of left upper lobe and large left effusion She was taken for thoracentesis by IR but this was deferred due to hypotension  Pertinent  Medical History  CVA -left hemiparesis, baseline wheelchair bound Hypertension Dementia Diabetes type 2  Significant Hospital Events: Including procedures, antibiotic start and stop dates in addition to other pertinent events     Interim History / Subjective:  Does not offer any complaints Afebrile Blood pressure improved to 103/65 without fluids  Objective   Blood pressure 105/67, pulse 90, temperature 97.8 F (36.6 C), temperature source Oral, resp. rate 16, height 5\' 2"  (1.575 m), weight 60.6 kg, SpO2 96%.        Intake/Output Summary (Last 24 hours) at 10/10/2023 1406 Last data filed at 10/10/2023 1200 Gross per 24 hour  Intake 1366 ml  Output 250 ml  Net 1116 ml   Filed Weights   10/09/23 1542  Weight: 60.6  kg    Examination: General: Elderly woman, chronically ill-appearing, lying supine on nasal cannula, no distress HENT: Mild pallor, no icterus Lungs: Decreased breath sounds on left, no accessory muscle use Cardiovascular: S1-S2 regular, no murmur Abdomen: Scaphoid, soft nontender Extremities: Right AKA, no edema Neuro: Nonverbal, does not follow commands, left AV (   Chest x-ray and CT reviewed Labs show no leukocytosis, hemoglobin improved from 6.5-7.2  Resolved Hospital Problem list     Assessment & Plan:  Acute on chronic respiratory failure with hypoxia-baseline uses 2 L now on 4 L oxygen Advanced metastatic squamous cell lung cancer -postobstructive atelectasis of left upper lobe Left pleural effusion likely malignant  -Thoracentesis is planned -She is not a candidate for systemic chemotherapy -Recommend palliative care consultation.  This is natural progression of her cancer.  Hospice care would be appropriate.  She is certainly not a candidate for Pleurx catheter.  Benefit of thoracentesis would be temporary if at all  PCCM will be available as needed  Best Practice (right click and "Reselect all SmartList Selections" daily)    Code Status:  full code Last date of multidisciplinary goals of care discussion [NA]  Labs   CBC: Recent Labs  Lab 10/09/23 1610 10/10/23 0543  WBC 10.8* 10.1  NEUTROABS 8.7*  --   HGB 6.5* 7.2*  HCT 21.9* 23.5*  MCV 61.0* 64.9*  PLT 466* 374    Basic Metabolic Panel: Recent Labs  Lab 10/09/23 1610 10/10/23 0543  NA 140 140  K 4.9 4.5  CL 105  110  CO2 22 22  GLUCOSE 253* 90  BUN 43* 32*  CREATININE 1.32* 1.01*  CALCIUM 9.1 8.5*   GFR: Estimated Creatinine Clearance: 40 mL/min (A) (by C-G formula based on SCr of 1.01 mg/dL (H)). Recent Labs  Lab 10/09/23 1610 10/09/23 1619 10/09/23 1757 10/10/23 0543  WBC 10.8*  --   --  10.1  LATICACIDVEN  --  2.5* 2.3* 1.3    Liver Function Tests: Recent Labs  Lab  10/09/23 1610 10/10/23 0543  AST 29 15  ALT 24 19  ALKPHOS 89 74  BILITOT 0.6 0.4  PROT 7.4 6.2*  ALBUMIN 2.2* 1.9*   No results for input(s): "LIPASE", "AMYLASE" in the last 168 hours. No results for input(s): "AMMONIA" in the last 168 hours.  ABG    Component Value Date/Time   HCO3 29.7 (H) 03/25/2023 2124   TCO2 24 09/22/2022 1755   O2SAT 47.4 03/25/2023 2124     Coagulation Profile: Recent Labs  Lab 10/09/23 1610  INR 1.2    Cardiac Enzymes: No results for input(s): "CKTOTAL", "CKMB", "CKMBINDEX", "TROPONINI" in the last 168 hours.  HbA1C: Hgb A1c MFr Bld  Date/Time Value Ref Range Status  02/24/2023 11:19 PM 6.1 (H) 4.8 - 5.6 % Final    Comment:    (NOTE) Pre diabetes:          5.7%-6.4%  Diabetes:              >6.4%  Glycemic control for   <7.0% adults with diabetes   09/22/2022 05:50 PM 7.4 (H) 4.8 - 5.6 % Final    Comment:    (NOTE)         Prediabetes: 5.7 - 6.4         Diabetes: >6.4         Glycemic control for adults with diabetes: <7.0     CBG: Recent Labs  Lab 10/10/23 0756 10/10/23 0833 10/10/23 1205  GLUCAP 66* 115* 121*    Review of Systems:   Unable to obtain  Past Medical History:  She,  has a past medical history of Abnormal gait, Anemia (06/24/2022), Arthritis, COPD (chronic obstructive pulmonary disease) (HCC), COVID-19 virus infection (07/2020), CVA (cerebral vascular accident) (HCC) (09/22/2022), Dementia (HCC), Depression, Diabetes mellitus without complication (HCC), GERD (gastroesophageal reflux disease), Headache, Hemiparesis affecting left side as late effect of cerebrovascular accident (CVA) (HCC), HOH (hard of hearing) (01/13/2018), Hypertension, Mass of lung, PAD (peripheral artery disease) (HCC), Pneumonia, Pyelonephritis, Seizures (HCC) (09/25/2022), Sensorineural hearing loss, Stroke (HCC), Tobacco use disorder (2019), UTI (urinary tract infection), and Wheelchair dependent.   Surgical History:   Past Surgical  History:  Procedure Laterality Date   ABDOMINAL AORTOGRAM W/LOWER EXTREMITY N/A 08/30/2020   Procedure: ABDOMINAL AORTOGRAM W/LOWER EXTREMITY;  Surgeon: Leonie Douglas, MD;  [facility];  Service: Cardiovascular;  Laterality: N/A;   ABDOMINAL AORTOGRAM W/LOWER EXTREMITY N/A 09/04/2020   Procedure: ABDOMINAL AORTOGRAM W/LOWER EXTREMITY;  Surgeon: Leonie Douglas, MD;  Location: [facility];  Service: Cardiovascular;  Laterality: N/A;   AMPUTATION Right 03/30/2023   Procedure: RIGHT AMPUTATION ABOVE KNEE;  Surgeon: Victorino Sparrow, MD;  [facility];  Service: Vascular;  Laterality: Right;   ESOPHAGOGASTRODUODENOSCOPY (EGD) WITH PROPOFOL N/A 09/16/2020   Procedure: ESOPHAGOGASTRODUODENOSCOPY (EGD) WITH PROPOFOL;  Surgeon: Shellia Cleverly, DO;  [facility]: [facility];  Service: Gastroenterology;  Laterality: N/A;   FINE NEEDLE ASPIRATION  10/27/2022   Procedure: FINE NEEDLE ASPIRATION (FNA) LINEAR;  Surgeon: Tonia Brooms,  Rachel Bo, DO;  Location: [facility];  Service: Pulmonary;;   HEMOSTASIS CLIP PLACEMENT  09/16/2020   Procedure: HEMOSTASIS CLIP PLACEMENT;  Surgeon: Shellia Cleverly, DO;  [facility];  Service: Gastroenterology;;   HOT HEMOSTASIS N/A 09/16/2020   Procedure: HOT HEMOSTASIS (ARGON PLASMA COAGULATION/BICAP);  Surgeon: Shellia Cleverly, DO;  Location: [facility] ENDOSCOPY;  Service: Gastroenterology;  Laterality: N/A;   PERIPHERAL VASCULAR INTERVENTION Right 08/30/2020   Procedure: PERIPHERAL VASCULAR INTERVENTION;  Surgeon: Leonie Douglas, MD;  [facility];  Service: Cardiovascular;  Laterality: Right;  femoral popliteal   PERIPHERAL VASCULAR INTERVENTION Left 09/04/2020   Procedure: PERIPHERAL VASCULAR INTERVENTION;  Surgeon: Leonie Douglas, MD;  [facility];  Service: Cardiovascular;  Laterality: Left;  SFA   TUBAL LIGATION     VIDEO BRONCHOSCOPY WITH ENDOBRONCHIAL ULTRASOUND Bilateral 10/27/2022   Procedure: VIDEO  BRONCHOSCOPY WITH ENDOBRONCHIAL ULTRASOUND;  Surgeon: Josephine Igo, DO;  Location: [facility];  Service: Pulmonary;  Laterality: Bilateral;     Social History:   reports that she quit smoking about 6 years ago. Her smoking use included cigarettes. She started smoking about 46 years ago. She has a 40 pack-year smoking history. She has never used smokeless tobacco. She reports that she does not drink alcohol and does not use drugs.   Family History:  Her family history includes Diabetes in her brother, mother, and sister.   Allergies Allergies  Allergen Reactions   Aspirin Other (See Comments)    "Cannot take due to previous GI bleed," per daughter       Home Medications  Prior to Admission medications   Medication Sig Start Date End Date Taking? Authorizing Provider  ACCU-CHEK GUIDE test strip USE TO test AS DIRECTED TWICE DAILY Patient taking differently: 1 each by Other route 2 (two) times daily. 05/23/21   Romero Belling, MD  Accu-Chek Softclix Lancets lancets 1 each by Other route daily. 08/26/22   [provider]  acetaminophen (TYLENOL) 500 MG tablet Take 1,000 mg by mouth every 6 (six) hours as needed (pain).    [provider]  albuterol (VENTOLIN HFA) 108 (90 Base) MCG/ACT inhaler Inhale 2 puffs into the lungs every 6 (six) hours as needed for wheezing or shortness of breath.    [provider]  amLODipine (NORVASC) 5 MG tablet Take 5 mg by mouth in the morning. 05/30/21   [provider]  atorvastatin (LIPITOR) 80 MG tablet Take 1 tablet (80 mg total) by mouth daily at 6 PM. Patient taking differently: Take 80 mg by mouth at bedtime. 12/21/18   Mikhail, Nita Sells, DO  cephALEXin (KEFLEX) 500 MG capsule Take 1 capsule (500 mg total) by mouth 2 (two) times daily. 05/05/23   Rhyne, Ames Coupe, PA-C  clopidogrel (PLAVIX) 75 MG tablet Take 1 tablet (75 mg total) by mouth daily. 09/24/20   Leonie Douglas, MD  docusate sodium (COLACE) 100 MG capsule  Take 1 capsule (100 mg total) by mouth daily. 04/03/23   Dorcas Carrow, MD  feeding supplement (ENSURE ENLIVE / ENSURE PLUS) LIQD Take 237 mLs by mouth 2 (two) times daily between meals. 06/28/20   Romero Belling, MD  gabapentin (NEURONTIN) 300 MG capsule Take 1 capsule (300 mg total) by mouth 3 (three) times daily. 09/06/20   Kathlen Mody, MD  insulin glargine (LANTUS) 100 UNIT/ML injection Inject 0.1 mLs (10 Units total) into the skin daily. Patient taking differently: Inject 10 Units into the skin in the morning.  02/27/23   Rodolph Bong, MD  linagliptin (TRADJENTA) 5 MG TABS tablet Take 5 mg by mouth daily.    [provider]  pantoprazole (PROTONIX) 40 MG tablet Take 40 mg by mouth 2 (two) times daily.    [provider]  SANTYL 250 UNIT/GM ointment Apply 1 Application topically See admin instructions. Apply as directed to affected areas of both feet daily as directed 10/23/22   [provider]  [facility]) 4"X4" PADS Apply 1 each topically daily. 04/02/23   Dorcas Carrow, MD     Cyril Mourning MD. FCCP. [facility] Pager : [phone]  If no response to pager , please call [phone] until 7 pm After 7:00 pm call Elink  [phone]   10/10/2023

## 2023-10-10 NOTE — Plan of Care (Cosign Needed)
 Patient is a 78 y.o. female who presented to the emergency department with mental status changes. She has a PMH of iron deficiency anemia, COPD, CVA, depression, dementia, and HTN.  At the beginning of the shift, CBG 66 mg/dL. Insulin held and patient was given orange juice. CBG re-assessed in 15 minutes and it was 115 mg/dL. At this time, patient was sleep but easily aroused and seemed fatigue. She struggled to sip from straw but eventually was successful.   Patient left unit for thoracentesis. When patient returned to  unit, 1000 medications administered - plavix, gabapentin, protonix.   Shortly after, shift assessment completed. Patient was more alert than previous status. Alert and responded to commands and painful stimuli - groaned when her leg was extended. Most likely related to stiffness from being in position for a while. Left hand grips weak and left dorsalis pedal pulse 1+. Patient has right-sided AKA.   Patient scheduled to leave unit for more fluid drainage/thoracostomy but, BP below normal range 105/63. RN notified.  Patient resting with family at bedside. No concerns identified at this time.

## 2023-10-10 NOTE — Assessment & Plan Note (Addendum)
 Blood pressure borderline soft. -Holding home amlodipine

## 2023-10-10 NOTE — Progress Notes (Signed)
 SLP Cancellation Note  Patient Details Name: Crystal Moore MRN: [id number] DOB: 04/25/1946   Cancelled treatment:       Reason Eval/Treat Not Completed: Patient at procedure or test/unavailable. Pt not in room - will f/u for swallow eval as able.    Mahala Menghini., M.A. CCC-SLP Acute Rehabilitation Services Office [phone]  Secure chat preferred  10/10/2023, 2:24 PM

## 2023-10-11 ENCOUNTER — Inpatient Hospital Stay (HOSPITAL_COMMUNITY)

## 2023-10-11 DIAGNOSIS — J189 Pneumonia, unspecified organism: Secondary | ICD-10-CM | POA: Diagnosis not present

## 2023-10-11 DIAGNOSIS — J9621 Acute and chronic respiratory failure with hypoxia: Secondary | ICD-10-CM | POA: Diagnosis not present

## 2023-10-11 DIAGNOSIS — J90 Pleural effusion, not elsewhere classified: Secondary | ICD-10-CM | POA: Diagnosis not present

## 2023-10-11 DIAGNOSIS — A419 Sepsis, unspecified organism: Secondary | ICD-10-CM | POA: Diagnosis not present

## 2023-10-11 LAB — TYPE AND SCREEN
ABO/RH(D): O POS
Antibody Screen: NEGATIVE
Unit division: 0

## 2023-10-11 LAB — BPAM RBC
Blood Product Expiration Date: 202504092359
ISSUE DATE / TIME: 202503160201
Unit Type and Rh: 5100

## 2023-10-11 LAB — GLUCOSE, CAPILLARY
Glucose-Capillary: 96 mg/dL (ref 70–99)
Glucose-Capillary: 222 mg/dL — ABNORMAL HIGH (ref 70–99)
Glucose-Capillary: 210 mg/dL — ABNORMAL HIGH (ref 70–99)
Glucose-Capillary: 92 mg/dL (ref 70–99)

## 2023-10-11 NOTE — Progress Notes (Signed)
 Interventional Radiology Brief Note:  Patient brought to IR for possible thoracentesis as BP improved, however no family available and unable to obtain consent. Attempted to contact and all numbers listed in chart but no answer. Will review for possible procedure tomorrow once able to obtain consent for procedure.   Loyce Dys, MS RD PA-C

## 2023-10-11 NOTE — Progress Notes (Signed)
 Pt has been ill-appearing, more drowsy today, but easily aroused. She is able to open her eyes, follow simple commands, but then back to sleep again. We hold her oral bedtime medicines tonight, concerning of aspiration due to low level of alertness.   She has poor oral intake. Her CBG at bedtime is 135 mg/dl. Her BP has been soft in 90s/60s mmHg, HR 70s-80s, NSR on the monitor, RR 12-17, SPO2 97-100% on 4 LPM of O2 NCL, normal respiratory effort. MD is aware.   Plan of care is reviewed. Pt has not been progressing well. We will continue to monitor.    Filiberto Pinks, RN

## 2023-10-11 NOTE — Assessment & Plan Note (Signed)
 CT scan with large left-sided pleural effusion likely malignant. Thoracentesis ordered-pending -Follow-up thoracentesis labs

## 2023-10-11 NOTE — Evaluation (Addendum)
 Clinical/Bedside Swallow Evaluation Patient Details  Name: Crystal Moore MRN: [id number] Date of Birth: [birth year]  Today's Date: 10/11/2023 Time: SLP Start Time (ACUTE ONLY): 0830 SLP Stop Time (ACUTE ONLY): 0848 SLP Time Calculation (min) (ACUTE ONLY): 18 min  Past Medical History:  Past Medical History:  Diagnosis Date   Abnormal gait    wheelchair bound   Anemia 06/24/2022   IDA   Arthritis    COPD (chronic obstructive pulmonary disease) (HCC)    COVID-19 virus infection 07/2020   CVA (cerebral vascular accident) (HCC) 09/22/2022   with residual left-sided weakness   Dementia (HCC)    Depression    no current problem   Diabetes mellitus without complication (HCC)    GERD (gastroesophageal reflux disease)    Headache    Hemiparesis affecting left side as late effect of cerebrovascular accident (CVA) (HCC)    w/c bound   HOH (hard of hearing) 01/13/2018   no hearing aids   Hypertension    Mass of lung    bilateral masses   PAD (peripheral artery disease) (HCC)    in CE   Pneumonia    x 4   Pyelonephritis    Seizures (HCC) 09/25/2022   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   Sensorineural hearing loss    bilateral in CE, no hearing aids   Stroke [facility])    Tobacco use disorder 2019   quit 2019   UTI (urinary tract infection)    Wheelchair dependent    Past Surgical History:  Past Surgical History:  Procedure Laterality Date   ABDOMINAL AORTOGRAM W/LOWER EXTREMITY N/A 08/30/2020   Procedure: ABDOMINAL AORTOGRAM W/LOWER EXTREMITY;  Surgeon: Leonie Douglas, MD;  [facility];  Service: Cardiovascular;  Laterality: N/A;   ABDOMINAL AORTOGRAM W/LOWER EXTREMITY N/A 09/04/2020   Procedure: ABDOMINAL AORTOGRAM W/LOWER EXTREMITY;  Surgeon: Leonie Douglas, MD;  [facility];  Service: Cardiovascular;  Laterality: N/A;   AMPUTATION Right 03/30/2023   Procedure: RIGHT AMPUTATION ABOVE KNEE;  Surgeon: Victorino Sparrow, MD;   Location: [facility] OR;  Service: Vascular;  Laterality: Right;   ESOPHAGOGASTRODUODENOSCOPY (EGD) WITH PROPOFOL N/A 09/16/2020   Procedure: ESOPHAGOGASTRODUODENOSCOPY (EGD) WITH PROPOFOL;  Surgeon: Shellia Cleverly, DO;  [facility];  Service: Gastroenterology;  Laterality: N/A;   FINE NEEDLE ASPIRATION  10/27/2022   Procedure: FINE NEEDLE ASPIRATION (FNA) LINEAR;  Surgeon: Josephine Igo, DO;  [facility];  Service: Pulmonary;;   HEMOSTASIS CLIP PLACEMENT  09/16/2020   Procedure: HEMOSTASIS CLIP PLACEMENT;  Surgeon: Shellia Cleverly, DO;  [facility];  Service: Gastroenterology;;   HOT HEMOSTASIS N/A 09/16/2020   Procedure: HOT HEMOSTASIS (ARGON PLASMA COAGULATION/BICAP);  Surgeon: Shellia Cleverly, DO;  Location: [facility] ENDOSCOPY;  Service: Gastroenterology;  Laterality: N/A;   PERIPHERAL VASCULAR INTERVENTION Right 08/30/2020   Procedure: PERIPHERAL VASCULAR INTERVENTION;  Surgeon: Leonie Douglas, MD;  [facility];  Service: Cardiovascular;  Laterality: Right;  femoral popliteal   PERIPHERAL VASCULAR INTERVENTION Left 09/04/2020   Procedure: PERIPHERAL VASCULAR INTERVENTION;  Surgeon: Leonie Douglas, MD;  [facility];  Service: Cardiovascular;  Laterality: Left;  SFA   TUBAL LIGATION     VIDEO BRONCHOSCOPY WITH ENDOBRONCHIAL ULTRASOUND Bilateral 10/27/2022   Procedure: VIDEO BRONCHOSCOPY WITH ENDOBRONCHIAL ULTRASOUND;  Surgeon: Josephine Igo, DO;  Location: [facility];  Service: Pulmonary;  Laterality: Bilateral;   HPI:  ARIAM Moore is a 78 y.o. female with  medical history significant of iron deficiency anemia, COPD, lunf cancer, prior CVA, depression, dementia, hypertension who presents emergency department with mental status changes. Patient underwent chest x-ray which showed consolidation of left lower lobe concerning for pneumonia complete opacification of the left hemithorax.  CT chest abdomen pelvis demonstrated complete  obstruction of left main bronchus with complete collapse and a large left effusion. BSE 02/23/23 occasional throat clear, difficulty to determine if cough was related to po's or chronic chough. Recommended Dys 3/thin    Assessment / Plan / Recommendation  Clinical Impression  Daughter feeding pt when SLP arrived and stated pt coughed yesterday when takes large sips which is what happened yesterday. Pt is endentulous and daughter cuts up meat for pt prior. She is exhibiting signs of a pharyngeal dysphagia and demonstrated consistent immediate cough following sips of thin with larger and some smaller sip. Mastication prolonged with sausage as does not have dentition but able to clear with additional time and sips thin. Daughter states she takes her meds whole in applesauce. Pt found to have pna, sepsis per chart.  Instrumental assessment recommended and MBS scheduled for today at 1330. Continue Dys 3/thin until then. SLP Visit Diagnosis: Dysphagia, unspecified (R13.10)    Aspiration Risk  Mild aspiration risk;Moderate aspiration risk    Diet Recommendation Dysphagia 3 (Mech soft);Thin liquid    Liquid Administration via: Cup;Straw Medication Administration: Whole meds with puree Supervision: Full supervision/cueing for compensatory strategies;Staff to assist with self feeding Compensations: Slow rate;Small sips/bites Postural Changes: Seated upright at 90 degrees    Other  Recommendations Oral Care Recommendations: Oral care BID    Recommendations for follow up therapy are one component of a multi-disciplinary discharge planning process, led by the attending physician.  Recommendations may be updated based on patient status, additional functional criteria and insurance authorization.  Follow up Recommendations        Assistance Recommended at Discharge    Functional Status Assessment    Frequency and Duration            Prognosis        Swallow Study   General Date of Onset:  10/09/23 HPI: Crystal Moore is a 78 y.o. female with medical history significant of iron deficiency anemia, COPD, lunf cancer, prior CVA, depression, dementia, hypertension who presents emergency department with mental status changes. Patient underwent chest x-ray which showed consolidation of left lower lobe concerning for pneumonia complete opacification of the left hemithorax.  CT chest abdomen pelvis demonstrated complete obstruction of left main bronchus with complete collapse and a large left effusion. BSE 02/23/23 occasional throat clear, difficulty to determine if cough was related to po's or chronic chough. Recommended Dys 3/thin Type of Study: Bedside Swallow Evaluation Previous Swallow Assessment:  (see HPI) Diet Prior to this Study: Dysphagia 3 (mechanical soft);Thin liquids (Level 0) Temperature Spikes Noted: No Respiratory Status: Nasal cannula History of Recent Intubation: No Behavior/Cognition: Alert;Cooperative;Pleasant mood;Requires cueing Oral Cavity Assessment: Within Functional Limits Oral Care Completed by SLP: No Oral Cavity - Dentition: Edentulous Vision: Functional for self-feeding Self-Feeding Abilities: Total assist (dtr feeding) Patient Positioning: Upright in bed Baseline Vocal Quality: Normal Volitional Cough: Cognitively unable to elicit Volitional Swallow: Unable to elicit    Oral/Motor/Sensory Function Overall Oral Motor/Sensory Function: Mild impairment Facial ROM: Reduced left;Suspected CN VII (facial) dysfunction (from prior stroke-dtr confirmed) Facial Symmetry: Abnormal symmetry left;Suspected CN VII (facial) dysfunction Lingual ROM:  (difficulty lateralizing)   Ice Chips Ice chips: Not tested   Thin Liquid Thin  Liquid: Impaired Presentation: Straw Pharyngeal  Phase Impairments: Cough - Immediate    Nectar Thick Nectar Thick Liquid: Not tested   Honey Thick Honey Thick Liquid: Not tested   Puree Puree: Not tested   Solid     Solid:  Impaired Oral Phase Functional Implications:  (prolonged mastication) Pharyngeal Phase Impairments:  (WFL)      [name] 10/11/2023,9:04 AM

## 2023-10-11 NOTE — Progress Notes (Signed)
 Progress Note   Patient: Crystal Moore:[id number] DOB: 28-Apr-1946 DOA: 10/09/2023     2 DOS: the patient was seen and examined on 10/11/2023   Brief hospital course: Taken from H&P.   Crystal Moore is a 78 y.o. female with medical history significant of iron deficiency anemia, COPD, prior CVA, depression, dementia, hypertension and stage IV lung cancer not on active treatment, who presents emergency department with mental status changes.  Patient has baseline dementia from a previous stroke.  She was reportedly found to be hypoxic in 70s and was placed on supplemental oxygen by EMS.  On arrival vital stable on 6 L of oxygen, labs with leukocytosis at a 10.8, hemoglobin 6.5, lactic acid 2.5>>2.3, creatinine 1.3 with baseline less than 1, respiratory panel negative.chest x-ray which showed consolidation of left lower lobe concerning for pneumonia complete opacification of the left hemithorax.  CT chest abdomen pelvis demonstrated complete obstruction of left main bronchus with complete collapse and a large left effusion.   Patient was started on Zosyn.  Remained full code. Seems like having a poor prognosis with worsening malignancy causing obstruction of left main bronchus. Left thoracentesis was ordered.  1 unit of PRBC ordered  3/16: Vital seems stable on 4 L of oxygen this morning, pending thoracentesis.  Consulting palliative care and pulmonology. Lactic acidosis resolved, hemoglobin improved to 7.2 s/p 1 unit of PRBC, improving leukocytosis, improving creatinine to 1.01, albumin 1.9, UA not consistent with UTI.  Patient with very poor underlying functional status. Per oncology note from last year not a candidate for any systemic therapy, they did palliative radiation for 2 weeks and suggested hospice. As patient still want her to be full code-ordered swallow evaluation as she was coughing on thin liquid.  3/17: Vitals currently stable on 4 L, thoracentesis again it got canceled as  there was no family available for concern today.  Blood pressure improved.  Pending palliative care.  Assessment and Plan: * Postobstructive pneumonia Severe sepsis.  Likely with postobstructive pneumonia with advanced lung malignancy.  Met sepsis criteria with leukocytosis, lactic acidosis and AKI. CT chest with malignant obstruction of left mainstem bronchus with complete collapse of left upper and lower lobe.  Large left pleural effusion. Lactic acidosis has been resolved.  Improving leukocytosis, preliminary blood cultures negative. -Continue with Zosyn -Continue with supportive care  Acute on chronic hypoxic respiratory failure (HCC) Patient uses 2 L at home, currently requiring 4 L. Likely due to collapse of left lung and large pleural effusion. -Continue supplemental oxygen-try weaning back to baseline as appropriate.  Pleural effusion CT scan with large left-sided pleural effusion likely malignant. Thoracentesis ordered-pending -Follow-up thoracentesis labs  AKI (acute kidney injury) (HCC) With sepsis, seems improving. 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 Repeat CT yesterday seems like advancement of disease and now obstruction of left mainstem bronchus Not a candidate for any systemic therapy due to underlying poor functional status and comorbidities.  She was given 2 weeks of palliative radiation and advise to follow-up with hospice. She is still full code-daughter does not want to involve palliative and eventually agreed after a detailed explanation, does not seem like understanding the gravity or the reason completely. -Palliative care was consulted  Diabetes mellitus without complication (HCC) CBG within goal -Continue with SSI  Essential hypertension Blood pressure borderline soft. -Holding home amlodipine  Hemiparesis  affecting left side as late  effect of cerebrovascular accident (CVA) [facility]) Patient is wheelchair and bedbound at baseline. -Continue home Plavix and statin  Dementia (HCC) -Delirium precautions      Subjective: Patient was resting and appeared comfortable when seen today.  Daughter at bedside, had another discussion regarding prior oncology recommendations of being hospice, per daughter she was never told.  Tried explaining but she said she is not ready for that.  Physical Exam: Vitals:   10/11/23 0741 10/11/23 0742 10/11/23 0800 10/11/23 1200  BP:  104/64 108/64 101/63  Pulse: 84 86 83 91  Resp: 18 (!) 22 16 17   Temp:  98.4 F (36.9 C)  98.5 F (36.9 C)  TempSrc:  Oral  Oral  SpO2: 97% 97% 93% 92%  Weight:      Height:       General.  Frail, lethargic and malnourished elderly lady, in no acute distress. Pulmonary.  Decreased breath sounds on left, harsh breath sounds on right CV.  Regular rate and rhythm, no JVD, rub or murmur. Abdomen.  Soft, nontender, nondistended, BS positive. CNS.  Alert and oriented .  No focal neurologic deficit. Extremities.  Right AKA, left with no edema  Data Reviewed: Prior data reviewed  Family Communication: Discussed with daughter at bedside  Disposition: Status is: Inpatient Remains inpatient appropriate because: Severity of illness  Planned Discharge Destination:  To be determined  DVT prophylaxis.  Lovenox Time spent: 50 minutes  This record has been created using Conservation officer, historic buildings. Errors have been sought and corrected,but may not always be located. Such creation errors do not reflect on the standard of care.   Author: Arnetha Courser, MD 10/11/2023 3:50 PM  For on call review [email or web address].

## 2023-10-11 NOTE — Progress Notes (Addendum)
 Modified Barium Swallow Study  Patient Details  Name: Crystal Moore MRN: [id number] Date of Birth: March 18, 1946  Today's Date: 10/11/2023  Modified Barium Swallow completed.  Full report located under Chart Review in the Imaging Section.  History of Present Illness Crystal Moore is a 78 y.o. female with medical history significant of iron deficiency anemia, COPD, lung cancer, prior CVA, depression, dementia, hypertension who presents emergency department with mental status changes. Patient underwent chest x-ray which showed consolidation of left lower lobe concerning for pneumonia complete opacification of the left hemithorax.  CT chest abdomen pelvis demonstrated complete obstruction of left main bronchus with complete collapse and a large left effusion. BSE 02/23/23 occasional throat clear, difficulty to determine if cough was related to po's or chronic chough. Recommended Dys 3/thin   Clinical Impression Pt exhibited a DIGEST score of 2 (moderate pharyngeal impairment) and oral dysphagia marked by slow mastication (endentulous) and trace lingual/floor of mouth residue. Most swallows were initiated at the valleculae and pt demonstrated decreased laryngeal closure resulting in aspiration with thin with ineffective weak throat clear. Pt was unable to accurately execute chin tuck lifting head prior to the swallow. Nectar thick was penetrated but was ejected during the swallow and with a straw pt aspirated nectar with weak throat clear. Residue was trace with thin and nectar consistencies in vallecuale. Recommend pt continue Dys 3 texture, downgrade liquids to nectar, no straws, pills whole in puree and supervision/assist with meals. SLP called pt's daughter to discuss results and she agrees with plan. Factors that may increase risk of adverse event in presence of aspiration [name] 2021): Reduced cognitive function;Weak cough  Swallow Evaluation Recommendations Recommendations: PO diet PO  Diet Recommendation: Mildly thick liquids (Level 2, nectar thick);Dysphagia 3 (Mechanical soft) Liquid Administration via: Cup;No straw Medication Administration: Whole meds with puree Supervision: Staff to assist with self-feeding Swallowing strategies  : Slow rate;Small bites/sips Postural changes: Position pt fully upright for meals Oral care recommendations: Oral care BID (2x/day)      [name] 10/11/2023,3:03 PM

## 2023-10-12 ENCOUNTER — Inpatient Hospital Stay (HOSPITAL_COMMUNITY)

## 2023-10-12 DIAGNOSIS — J90 Pleural effusion, not elsewhere classified: Secondary | ICD-10-CM | POA: Diagnosis not present

## 2023-10-12 DIAGNOSIS — J189 Pneumonia, unspecified organism: Secondary | ICD-10-CM | POA: Diagnosis not present

## 2023-10-12 DIAGNOSIS — A419 Sepsis, unspecified organism: Secondary | ICD-10-CM | POA: Diagnosis not present

## 2023-10-12 DIAGNOSIS — Z7189 Other specified counseling: Secondary | ICD-10-CM | POA: Diagnosis not present

## 2023-10-12 DIAGNOSIS — J9621 Acute and chronic respiratory failure with hypoxia: Secondary | ICD-10-CM | POA: Diagnosis not present

## 2023-10-12 LAB — LACTATE DEHYDROGENASE, PLEURAL OR PERITONEAL FLUID: LD, Fluid: 122 U/L — ABNORMAL HIGH (ref 3–23)

## 2023-10-12 LAB — BODY FLUID CELL COUNT WITH DIFFERENTIAL
Eos, Fluid: 0 %
Lymphs, Fluid: 44 %
Monocyte-Macrophage-Serous Fluid: 11 % — ABNORMAL LOW (ref 50–90)
Neutrophil Count, Fluid: 45 % — ABNORMAL HIGH (ref 0–25)
Total Nucleated Cell Count, Fluid: 961 uL (ref 0–1000)

## 2023-10-12 LAB — GLUCOSE, CAPILLARY
Glucose-Capillary: 115 mg/dL — ABNORMAL HIGH (ref 70–99)
Glucose-Capillary: 198 mg/dL — ABNORMAL HIGH (ref 70–99)
Glucose-Capillary: 173 mg/dL — ABNORMAL HIGH (ref 70–99)
Glucose-Capillary: 224 mg/dL — ABNORMAL HIGH (ref 70–99)

## 2023-10-12 LAB — PROTEIN, PLEURAL OR PERITONEAL FLUID: Total protein, fluid: 3.5 g/dL

## 2023-10-12 LAB — GLUCOSE, PLEURAL OR PERITONEAL FLUID: Glucose, Fluid: 112 mg/dL

## 2023-10-12 MED ORDER — LIDOCAINE HCL 1 % IJ SOLN
20.0000 mL | Freq: Once | INTRAMUSCULAR | Status: AC
  Administered 2023-10-12: 10 mL via INTRADERMAL
  Filled 2023-10-12: qty 20

## 2023-10-12 MED ORDER — LIDOCAINE HCL 1 % IJ SOLN
INTRAMUSCULAR | Status: AC
  Filled 2023-10-12: qty 20

## 2023-10-12 NOTE — Plan of Care (Signed)
 progressing

## 2023-10-12 NOTE — Progress Notes (Signed)
 Pharmacy Antibiotic Note  Crystal Moore is a 78 y.o. female admitted on 10/09/2023 with pneumonia.  Pharmacy has been consulted for Zosyn dosing.  Planning for thoracentesis once consent is acquired. Continue abx.  Plan: Zosyn 3.375g IV q8h (4 hour infusion).  Height: 5\' 2"  (157.5 cm) Weight: 60.6 kg (133 lb 9.6 oz) IBW/kg (Calculated) : 50.1  Temp (24hrs), Avg:98.4 F (36.9 C), Min:98.1 F (36.7 C), Max:98.5 F (36.9 C)  Recent Labs  Lab 10/09/23 1610 10/09/23 1619 10/09/23 1757 10/10/23 0543  WBC 10.8*  --   --  10.1  CREATININE 1.32*  --   --  1.01*  LATICACIDVEN  --  2.5* 2.3* 1.3    Estimated Creatinine Clearance: 40 mL/min (A) (by C-G formula based on SCr of 1.01 mg/dL (H)).    Allergies  Allergen Reactions   Aspirin Other (See Comments)    "Cannot take due to previous GI bleed," per daughter     Zosyn 3/16 > CRO/azithro 3/15 x1   3/15 Bcx ngtd Flu/RSV neg  Ulyses Southward, PharmD, BCIDP, AAHIVP, CPP Infectious Disease Pharmacist 10/12/2023 9:58 AM

## 2023-10-12 NOTE — Progress Notes (Signed)
 Speech Language Pathology Treatment: Dysphagia  Patient Details Name: Crystal Moore MRN: [id number] DOB: Apr 09, 1946 Today's Date: 10/12/2023 Time: 0812-0821 SLP Time Calculation (min) (ACUTE ONLY): 9 min  Assessment / Plan / Recommendation Clinical Impression  Pt easily awoken from sleep and observed with nectar thick liquids and Dys 3 following MBS yesterday. Required hand over hand to hold cup and consume sips nectar thick over trials without s/s aspiration and adequate oral transit. Masticated graham cracker within reasonable time without oral residue. SLP left Simply [employer]ick nectar kit in room for family to take home to thicken liquids (daughter called and educated yesterday). Continue Dys 3, nectar thick, no straws and pills whole in puree. Continued ST while on acute and would benefit from health ST at discharge.    HPI HPI: Crystal Moore is a 78 y.o. female with medical history significant of iron deficiency anemia, COPD, lunf cancer, prior CVA, depression, dementia, hypertension who presents emergency department with mental status changes. Patient underwent chest x-ray which showed consolidation of left lower lobe concerning for pneumonia complete opacification of the left hemithorax.  CT chest abdomen pelvis demonstrated complete obstruction of left main bronchus with complete collapse and a large left effusion. BSE 02/23/23 occasional throat clear, difficulty to determine if cough was related to po's or chronic chough. Recommended Dys 3/thin      SLP Plan  Continue with current plan of care      Recommendations for follow up therapy are one component of a multi-disciplinary discharge planning process, led by the attending physician.  Recommendations may be updated based on patient status, additional functional criteria and insurance authorization.    Recommendations  Diet recommendations: Dysphagia 3 (mechanical soft);Nectar-thick liquid Liquids provided via: Cup;No  straw Medication Administration: Whole meds with puree Supervision: Staff to assist with self feeding Compensations: Slow rate;Small sips/bites Postural Changes and/or Swallow Maneuvers: Seated upright 90 degrees                  Oral care BID   Frequent or constant Supervision/Assistance Dysphagia, oropharyngeal phase (R13.12)     Continue with current plan of care     Crystal Moore  10/12/2023, 8:25 AM

## 2023-10-12 NOTE — Progress Notes (Signed)
 Progress Note   Patient: Crystal Moore VWU:[id number] DOB: [birth year] DOA: 10/09/2023     3 DOS: the patient was seen and examined on 10/12/2023   Brief hospital course: Taken from H&P.   Crystal Moore is a 78 y.o. female with medical history significant of iron deficiency anemia, COPD, prior CVA, depression, dementia, hypertension and stage IV lung cancer not on active treatment, who presents emergency department with mental status changes.  Patient has baseline dementia from a previous stroke.  She was reportedly found to be hypoxic in 70s and was placed on supplemental oxygen by EMS.  On arrival vital stable on 6 L of oxygen, labs with leukocytosis at a 10.8, hemoglobin 6.5, lactic acid 2.5>>2.3, creatinine 1.3 with baseline less than 1, respiratory panel negative.chest x-ray which showed consolidation of left lower lobe concerning for pneumonia complete opacification of the left hemithorax.  CT chest abdomen pelvis demonstrated complete obstruction of left main bronchus with complete collapse and a large left effusion.   Patient was started on Zosyn.  Remained full code. Seems like having a poor prognosis with worsening malignancy causing obstruction of left main bronchus. Left thoracentesis was ordered.  1 unit of PRBC ordered  3/16: Vital seems stable on 4 L of oxygen this morning, pending thoracentesis.  Consulting palliative care and pulmonology. Lactic acidosis resolved, hemoglobin improved to 7.2 s/p 1 unit of PRBC, improving leukocytosis, improving creatinine to 1.01, albumin 1.9, UA not consistent with UTI.  Patient with very poor underlying functional status. Per oncology note from last year not a candidate for any systemic therapy, they did palliative radiation for 2 weeks and suggested hospice. As patient still want her to be full code-ordered swallow evaluation as she was coughing on thin liquid.  3/17: Vitals currently stable on 4 L, thoracentesis again it got canceled as  there was no family available for concern today.  Blood pressure improved.  Pending palliative care.  3/18: Hemodynamically stable, s/p thoracentesis today, labs and cytology pending, likely malignant effusion.  Pending palliative care consult. Patient is appropriate for comfort care and hospice at home  Assessment and Plan: * Postobstructive pneumonia Severe sepsis.  Likely with postobstructive pneumonia with advanced lung malignancy.  Met sepsis criteria with leukocytosis, lactic acidosis and AKI. CT chest with malignant obstruction of left mainstem bronchus with complete collapse of left upper and lower lobe.  Large left pleural effusion. Lactic acidosis has been resolved.  Improving leukocytosis, preliminary blood cultures negative. -Continue with Zosyn -Continue with supportive care  Acute on chronic hypoxic respiratory failure (HCC) Patient uses 2 L at home, currently requiring 4 L. Likely due to collapse of left lung and large pleural effusion. -Continue supplemental oxygen-try weaning back to baseline as appropriate.  Pleural effusion CT scan with large left-sided pleural effusion likely malignant. S/p thoracentesis today -Follow-up thoracentesis labs  AKI (acute kidney injury) (HCC) With sepsis, seems improving. 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 Repeat CT yesterday seems like advancement of disease and now obstruction of left mainstem bronchus Not a candidate for any systemic therapy due to underlying poor functional status and comorbidities.  She was given 2 weeks of palliative radiation and advise to follow-up with hospice. She is still full code-daughter does not want to involve palliative and eventually agreed after a detailed explanation, does not seem like understanding the gravity or the reason completely. -Palliative care was  consulted  Diabetes mellitus without complication (HCC) CBG within goal -Continue with SSI  Essential hypertension Blood pressure borderline soft. -Holding home amlodipine  Hemiparesis affecting left side as late effect of cerebrovascular accident (CVA) (HCC) Patient is wheelchair and bedbound at baseline. -Continue home Plavix and statin  Dementia (HCC) -Delirium precautions      Subjective: Patient was complaining of some discomfort at thoracentesis site when seen today.  No other concern.  Physical Exam: Vitals:   10/12/23 1031 10/12/23 1039 10/12/23 1046 10/12/23 1200  BP: (!) 101/59 (!) 88/55 107/62 110/72  Pulse:   95 85  Resp:   16 19  Temp:   98.3 F (36.8 C) 98.6 F (37 C)  TempSrc:   Oral Oral  SpO2:   98% 95%  Weight:      Height:       General.  Chronically ill-appearing, frail and malnourished elderly lady, in no acute distress. Pulmonary.  Decreased breath sounds on left, CV.  Regular rate and rhythm, no JVD, rub or murmur. Abdomen.  Soft, nontender, nondistended, BS positive. CNS.  Alert and oriented .  No focal neurologic deficit. Extremities.  Right AKA, no edema on left  Data Reviewed: Prior data reviewed  Family Communication: Discussed with daughter at bedside  Disposition: Status is: Inpatient Remains inpatient appropriate because: Severity of illness  Planned Discharge Destination:  To be determined  DVT prophylaxis.  Lovenox Time spent: 50 minutes  This record has been created using Conservation officer, historic buildings. Errors have been sought and corrected,but may not always be located. Such creation errors do not reflect on the standard of care.   Author: Arnetha Courser, MD 10/12/2023 1:16 PM  For on call review [email or web address].

## 2023-10-12 NOTE — Consult Note (Signed)
 Consultation Note Date: 10/12/2023   Patient Name: Crystal Moore  DOB: Nov 22, 1945  MRN: [id number]  Age / Sex: 78 y.o., female  PCP: Lorenda Ishihara, MD Referring Physician: Arnetha Courser, MD  Reason for Consultation: To discuss goals of care, advanced lung cancer.  HPI/Patient Profile: 78 y.o. female  with past medical history of dementia, stage IV non-small cell lung cancer status post palliative radiation last year.  She was not a candidate for further systemic therapies and hospice was recommended at that time.  She also has PAD and ILD, prior stroke, depression, COPD, iron deficiency anemia.  Most hospitalization August 2024 for osteomyelitis she had amputation at that time.  She was admitted on 10/09/2023 with hypoxia in the 70s.  I reviewed the CT of the chest which shows new malignant obstruction of the left mainstem bronchus with complete collapse of the left upper lobe and left lower lobe there is also a large left effusion.  She has masses in the left upper lobe left lower lobe and right upper lobe that have increased in size since her previous CT scan there is also invasion into her mediastinum by the left lung mass.  Thoracentesis was completed today with a total of 900 mL off.  Sample has been sent for pathology.  This is likely metastatic malignant effusion.  Palliative medicine consulted for goals of care in the setting of advanced lung cancer.  Primary Decision Maker PATIENT  Discussion: Chart reviewed including labs, progress notes, imaging from this and previous encounters.  Patient has been seen by inpatient palliative providers approximately 7 times over the last year each time family has requested full CODE STATUS and full scope interventions. Case was discussed with Dr. Nelson Chimes her attending provider.  Patient with worsening cancer no real interventions available. I reviewed notes from  radiation oncology and oncology from last year-note that hospice was offered around April 2024 and declined. Patient has a MOST form on her chart including indicating her wishes for attempts at resuscitation, fullscope treatment, antibiotics if indicated, IV fluids if indicated, feeding tube for defined trial period. I attempted to call her daughter and left a message requesting return call.    SUMMARY OF RECOMMENDATIONS -Stage IV lung cancer now worsened with left lobe collapse and left bronchial occlusion-not a candidate for any further treatment for this-very appropriate for hospice level care if patient and family are in agreement-they have in the past expressed no interest in hospice when it has been offered -I attempted to call patient's daughter-I left a message requesting a return call  Code Status/Advance Care Planning:   Code Status: Full Code    Prognosis:   Unable to determine  Discharge Planning: To Be Determined  Primary Diagnoses: Present on Admission:  Postobstructive pneumonia  Severe sepsis (HCC)  Dementia (HCC)  Essential hypertension  AKI (acute kidney injury) (HCC)  Acute on chronic hypoxic respiratory failure (HCC)   Review of Systems  Physical Exam  Vital Signs: BP 110/72 (BP Location: Left Arm)   Pulse 85  Temp 98.6 F (37 C) (Oral)   Resp 19   Ht 5\' 2"  (1.575 m)   Wt 60.6 kg   SpO2 95%   BMI 24.44 kg/m  Pain Scale: PAINAD POSS *See Group Information*: 1-Acceptable,Awake and alert Pain Score: Asleep   SpO2: SpO2: 95 % O2 Device:SpO2: 95 % O2 Flow Rate: .O2 Flow Rate (L/min): 4 L/min  IO: Intake/output summary: No intake or output data in the 24 hours ending 10/12/23 1601  LBM: Last BM Date : 10/11/23 Baseline Weight: Weight: 60.6 kg Most recent weight: Weight: 60.6 kg       Thank you for this consult. Palliative medicine will continue to follow and assist as needed.   Signed by: Ocie Bob, AGNP-C Palliative Medicine  Time  includes:   Preparing to see the patient (e.g., review of tests) Obtaining and/or reviewing separately obtained history Performing a medically necessary appropriate examination and/or evaluation Counseling and educating the patient/family/caregiver Ordering medications, tests, or procedures Referring and communicating with other health care professionals (when not reported separately) Documenting clinical information in the electronic or other health record Independently interpreting results (not reported separately) and communicating results to the patient/family/caregiver Care coordination (not reported separately) Clinical documentation   Please contact Palliative Medicine Team phone at [phone] for questions and concerns.  For individual provider: See Loretha Stapler

## 2023-10-12 NOTE — Assessment & Plan Note (Signed)
 With sepsis, seems improving. -Monitor renal function

## 2023-10-13 DIAGNOSIS — J189 Pneumonia, unspecified organism: Secondary | ICD-10-CM | POA: Diagnosis not present

## 2023-10-13 DIAGNOSIS — C799 Secondary malignant neoplasm of unspecified site: Secondary | ICD-10-CM | POA: Diagnosis not present

## 2023-10-13 DIAGNOSIS — Z7189 Other specified counseling: Secondary | ICD-10-CM | POA: Diagnosis not present

## 2023-10-13 LAB — GLUCOSE, CAPILLARY
Glucose-Capillary: 156 mg/dL — ABNORMAL HIGH (ref 70–99)
Glucose-Capillary: 210 mg/dL — ABNORMAL HIGH (ref 70–99)
Glucose-Capillary: 65 mg/dL — ABNORMAL LOW (ref 70–99)
Glucose-Capillary: 90 mg/dL (ref 70–99)
Glucose-Capillary: 178 mg/dL — ABNORMAL HIGH (ref 70–99)

## 2023-10-13 LAB — CYTOLOGY - NON PAP

## 2023-10-13 MED ORDER — METOPROLOL TARTRATE 5 MG/5ML IV SOLN: 5.0000 mg | INTRAVENOUS | Status: DC | PRN

## 2023-10-13 MED ORDER — PIPERACILLIN-TAZOBACTAM 3.375 G IVPB
3.3750 g | Freq: Three times a day (TID) | INTRAVENOUS | Status: DC
  Administered 2023-10-14: 3.375 g via INTRAVENOUS
  Filled 2023-10-13: qty 50

## 2023-10-13 MED ORDER — MUPIROCIN 2 % EX OINT
TOPICAL_OINTMENT | Freq: Two times a day (BID) | CUTANEOUS | Status: DC
  Administered 2023-10-13: 1 via NASAL
  Filled 2023-10-13: qty 22

## 2023-10-13 MED ORDER — IPRATROPIUM-ALBUTEROL 0.5-2.5 (3) MG/3ML IN SOLN: 3.0000 mL | RESPIRATORY_TRACT | Status: DC | PRN

## 2023-10-13 MED ORDER — SALINE SPRAY 0.65 % NA SOLN
1.0000 | NASAL | Status: DC | PRN
  Filled 2023-10-13: qty 44

## 2023-10-13 MED ORDER — HYDRALAZINE HCL 20 MG/ML IJ SOLN: 10.0000 mg | INTRAMUSCULAR | Status: DC | PRN

## 2023-10-13 NOTE — Plan of Care (Signed)
 progressing

## 2023-10-13 NOTE — Care Management Important Message (Signed)
 Important Message  Patient Details  Name: Crystal Moore MRN: [id number] Date of Birth: 07-26-1946   Important Message Given:  Yes - Medicare IM     Dorena Bodo 10/13/2023, 9:31 AM

## 2023-10-13 NOTE — Progress Notes (Signed)
 Daily Progress Note   Patient Name: Crystal Moore       Date: 10/13/2023 DOB: 10/20/1945  Age: 78 y.o. MRN#: [id number] Attending Physician: Crystal Rota, MD Primary Care Physician: Crystal Ishihara, MD Admit Date: 10/09/2023  Reason for Consultation/Follow-up: Establishing goals of care  Patient Profile/HPI:  78 y.o. female  with past medical history of dementia, stage IV non-small cell lung cancer status post palliative radiation last year.  She was not a candidate for further systemic therapies and hospice was recommended at that time.  She also has PAD and ILD, prior stroke, depression, COPD, iron deficiency anemia.  Most hospitalization August 2024 for osteomyelitis she had amputation at that time.  She was admitted on 10/09/2023 with hypoxia in the 70s.  I reviewed the CT of the chest which shows new malignant obstruction of the left mainstem bronchus with complete collapse of the left upper lobe and left lower lobe there is also a large left effusion.  She has masses in the left upper lobe left lower lobe and right upper lobe that have increased in size since her previous CT scan there is also invasion into her mediastinum by the left lung mass.  Thoracentesis was completed today with a total of 900 mL off.  Sample has been sent for pathology.  This is likely metastatic malignant effusion.  Palliative medicine consulted for goals of care in the setting of advanced lung cancer.   Subjective: Chart reviewed including labs, progress notes, imaging from this and previous encounters.  Oxygen requirements are down to 4 L via nasal cannula.  Patient's daughter Crystal Moore was at bedside. On eval patient is resting comfortably.  Her daughter reports her breathing is improved.  She has had some  complaint of leg pain and complaint of her nose and behind her ears being irritated by her oxygen cannula. I met at the bedside with Crystal Moore.  Crystal Moore shared that Crystal Moore has many family members who love and adore her.  She also has a Engineer, water.  Crystal Moore's grandson is getting married in 2 months and it is all of their hope for her to survive to be able to see her grandson married. We discussed [name]'s acute and chronic illness.  I reviewed the CT scan and chest x-ray with Crystal Moore showing the mass and the effect of  the left lung mass occluding her bronchus which is blocking the airway to her left lung. Crystal Moore does understand that [name] has an end-stage cancer that she will eventually die from.  However, she does want to continue aggressive measures including artificial life support if necessary to keep [name] alive.  CODE STATUS was discussed.  Crystal Moore would not want her mom to go through CPR she understands this would be very rough on her mom and would likely not change her outcome.  I reviewed a DO NOT RESUSCITATE status and Crystal Moore is in agreement with this.  She does want all other aggressive measures including artificial life support to keep her mother breathing and her heart beating. Crystal Moore understands that hospice support has been recommended for her mom.  However, she has had experiences with hospice in the past that influence her view of hospice and does not wish for their assistance at this time.  Crystal Moore and her mom have good support at home including the private caregiver and [name] is well cared for at home.   ROS   Physical Exam Vitals and nursing note reviewed.  Constitutional:      Comments: Appears frail  Cardiovascular:     Rate and Rhythm: Normal rate.  Pulmonary:     Effort: Pulmonary effort is normal.  Neurological:     Comments: Sleeping             Vital Signs: BP (!) 95/56 (BP Location: Left Arm)   Pulse 80   Temp 98.4 F (36.9 C) (Oral)   Resp 17    Ht 5\' 2"  (1.575 m)   Wt 60.6 kg   SpO2 96%   BMI 24.44 kg/m  SpO2: SpO2: 96 % O2 Device: O2 Device: Nasal Cannula O2 Flow Rate: O2 Flow Rate (L/min): 4 L/min  Intake/output summary:  Intake/Output Summary (Last 24 hours) at 10/13/2023 1153 Last data filed at 10/13/2023 0710 Gross per 24 hour  Intake 240 ml  Output --  Net 240 ml   LBM: Last BM Date : 10/13/23 Baseline Weight: Weight: 60.6 kg Most recent weight: Weight: 60.6 kg       Palliative Assessment/Data: PPS: 20%      Patient Active Problem List   Diagnosis Date Noted   Squamous cell carcinoma lung (HCC) 10/10/2023   Pleural effusion 10/10/2023   Hemiparesis affecting left side as late effect of cerebrovascular accident (CVA) (HCC)    Postobstructive pneumonia 10/09/2023   Pressure injury of skin 03/24/2023   Osteomyelitis of foot, right, acute (HCC) 03/23/2023   Aspiration pneumonia (HCC) 02/24/2023   Hypophosphatemia 02/24/2023   Complicated UTI (urinary tract infection) 02/23/2023   Acute metabolic encephalopathy 02/22/2023   History of CVA (cerebrovascular accident) 02/22/2023   Tracheal mass 02/22/2023   Goals of care, counseling/discussion 11/11/2022   Malignant neoplasm of upper lobe of left lung (HCC) 10/15/2022   Neurological symptoms 09/22/2022   Hypoxemia 09/22/2022   Acute on chronic hypoxic respiratory failure (HCC)    Diabetic oculopathy (HCC) 11/14/2020   Hemiplegia of nondominant side as late effect of cerebrovascular disease (HCC) 11/14/2020   Hyperglycemia due to type 2 diabetes mellitus (HCC) 11/14/2020   Long term (current) use of insulin (HCC) 11/14/2020   Memory loss 11/14/2020   Nicotine dependence, unspecified, uncomplicated 11/14/2020   Non-pressure chronic ulcer of other part of left foot with unspecified severity (HCC) 11/14/2020   Residual cognitive deficit as late effect of cerebrovascular accident 11/14/2020   Pressure injury of skin of both feet  09/16/2020   Acute blood loss  anemia    Gastric AVM    Acute upper GI bleed 09/14/2020   Symptomatic anemia 09/14/2020   PVD (peripheral vascular disease) (HCC) 09/14/2020   Cellulitis of great toe of right foot 08/26/2020   CVA (cerebral vascular accident) (HCC)    COVID-19 virus infection 07/2020   Ankle ulcer (HCC) 06/28/2020   Closed fracture of neck of left femur (HCC) 08/11/2019   COVID-19 virus detected 08/11/2019   Dementia (HCC) 08/11/2019   AKI (acute kidney injury) (HCC) 01/15/2019   Cerebral thrombosis with cerebral infarction 01/15/2019   TIA (transient ischemic attack) 01/13/2019   Insulin dependent type 2 diabetes mellitus (HCC) 01/13/2019   Hypokalemia 12/18/2018   Acute CVA (cerebrovascular accident) (HCC) 12/17/2018   Protein-calorie malnutrition, moderate (HCC) 05/31/2018   Hematochezia 05/29/2018   Ischemic colitis (HCC) 05/29/2018   Acute encephalopathy 05/29/2018   Type 2 diabetes mellitus (HCC) 05/29/2018   Essential hypertension 05/29/2018   Depression 05/29/2018   COPD (chronic obstructive pulmonary disease) (HCC) 05/29/2018   Severe sepsis (HCC) 05/28/2018   Diabetes mellitus without complication (HCC) 03/16/2018   ILD (interstitial lung disease) (HCC) 02/18/2018   Chronic respiratory failure with hypoxia (HCC) 02/18/2018   COPD (chronic obstructive pulmonary disease) (HCC) 01/12/2018   Tobacco abuse 01/12/2018   Arthritis 07/29/2017   Asthma 07/29/2017   Gastroesophageal reflux disease 07/29/2017   Hyperlipidemia 07/29/2017   Left hemiplegia (HCC) 07/29/2017   Sciatica 07/29/2017   Vitamin D deficiency 07/29/2017    Palliative Care Assessment & Plan    Assessment/Recommendations/Plan  Patient admitted with postobstructive pneumonia and pleural effusions in the setting of stage IV lung cancer Goals of care-medical stabilization and discharge home with current supports that she has some place CODE STATUS changed to DNR-prearrest interventions desired including intubation if  necessary Nasal and skin irritation related to oxygen from nasal cannula-discussed with RN-she will add humidification to oxygen, I have ordered nasal saline and mupirocin ointment for her nose Left leg pain that is chronic-her daughter reports that is often relieved with massage-I have added order for heat therapy   Code Status:   Code Status: Do not attempt resuscitation (DNR) PRE-ARREST INTERVENTIONS DESIRED   Prognosis:  < 6 months  Discharge Planning: Home with Home Health  Care plan was discussed with patient's daughter and attending MD.  Thank you for allowing the Palliative Medicine Team to assist in the care of this patient.  Total time:  80 minutes Prolonged billing:  Time includes:   Preparing to see the patient (e.g., review of tests) Obtaining and/or reviewing separately obtained history Performing a medically necessary appropriate examination and/or evaluation Counseling and educating the patient/family/caregiver Ordering medications, tests, or procedures Referring and communicating with other health care professionals (when not reported separately) Documenting clinical information in the electronic or other health record Independently interpreting results (not reported separately) and communicating results to the patient/family/caregiver Care coordination (not reported separately) Clinical documentation  Ocie Bob, AGNP-C Palliative Medicine   Please contact Palliative Medicine Team phone at [phone] for questions and concerns.

## 2023-10-13 NOTE — Progress Notes (Signed)
 PROGRESS NOTE    Crystal Moore  GUR:[id number] DOB: 07/26/1946 DOA: 10/09/2023 PCP: Lorenda Ishihara, MD    Brief Narrative:   78 y.o. female with medical history significant of iron deficiency anemia, COPD, prior CVA, depression, dementia, hypertension and stage IV lung cancer not on active treatment, who presents emergency department with mental status changes.  Patient found to have acute hypoxic respiratory failure secondary to obstructive pneumonia from worsening malignancy.  Assessment & Plan:  Principal Problem:   Postobstructive pneumonia Active Problems:   Severe sepsis (HCC)   Acute on chronic hypoxic respiratory failure (HCC)   Pleural effusion   AKI (acute kidney injury) (HCC)   Squamous cell carcinoma lung (HCC)   Diabetes mellitus without complication (HCC)   Essential hypertension   Hemiparesis affecting left side as late effect of cerebrovascular accident (CVA) (HCC)   Dementia (HCC)   Acute on chronic hypoxic respiratory failure secondary to postobstructive pneumonia Severe sepsis.   -Scans are showing malignant obstruction of the left main bronchus and complete collapse with large effusion.  No longer on chemotherapy.  Continue supplemental oxygen -On empiric Zosyn    Pleural effusion IR performed left-sided thoracentesis, 900 cc removed 3/18   AKI (acute kidney injury) (HCC) Creatinine 1.0   Squamous cell carcinoma lung (HCC), stage IV Previous oncology and pulmonary note reviewed.  Hospice has been recommended as patient is not a candidate for any systemic therapy.  Also determined not a candidate for Pleurx catheter   Diabetes mellitus without complication (HCC) CBG within goal -Continue with SSI   Essential hypertension Norvasc on hold.  IV as needed   Hemiparesis affecting left side as late effect of cerebrovascular accident (CVA) (HCC) Wheelchair-bound but Home Plavix and statin   Dementia (HCC) -Delirium precautions  Unfortunately  patient has very poor prognosis.  Strongly recommend comfort care with inpatient hospice   DVT prophylaxis: Lovenox DNR Family Communication: Daughter at bedside Status is: Inpatient Remains inpatient appropriate because: Hopefully discharge tomorrow  Subjective: Patient appears comfortable.  Met with daughter at bedside. Agreeable to transition patient to DNR/DNI. She understands patient has very poor prognosis   Examination:  General exam: Appears calm and comfortable.  Chronically ill-appearing Respiratory system: Minimal to no breath sounds on the left side. Cardiovascular system: S1 & S2 heard, RRR. No JVD, murmurs, rubs, gallops or clicks. No pedal edema. Gastrointestinal system: Abdomen is nondistended, soft and nontender. No organomegaly or masses felt. Normal bowel sounds heard. Central nervous system: Alert and oriented to name only Extremities: Symmetric 5 x 5 power. Skin: No rashes, lesions or ulcers Psychiatry: Judgement and insight appear poor            Pressure Injury 10/09/23 Coccyx Mid Stage 1 -  Intact skin with non-blanchable redness of a localized area usually over a bony prominence. An old scar pressure ulcer with pink color, fully granulated on mid sacral area. (Active)  10/09/23 2000  Location: Coccyx  Location Orientation: Mid  Staging: Stage 1 -  Intact skin with non-blanchable redness of a localized area usually over a bony prominence.  Wound Description (Comments): An old scar pressure ulcer with pink color, fully granulated on mid sacral area.  Present on Admission: Yes     Diet Orders (From admission, onward)     Start     Ordered   10/11/23 1417  DIET DYS 3 Room service appropriate? No; Fluid consistency: Nectar Thick  Diet effective now       Question Answer Comment  Room service appropriate? No   Fluid consistency: Nectar Thick      10/11/23 1416            Objective: Vitals:   10/12/23 1953 10/12/23 2307 10/13/23 0320  10/13/23 0710  BP: 104/65 106/61 (!) 95/56   Pulse: 85 85 81 80  Resp: 14 14 17 17   Temp: 97.8 F (36.6 C) 98.2 F (36.8 C) 98.4 F (36.9 C)   TempSrc: Axillary Oral Oral   SpO2: 100% 100% 99% 96%  Weight:      Height:        Intake/Output Summary (Last 24 hours) at 10/13/2023 1329 Last data filed at 10/13/2023 0710 Gross per 24 hour  Intake 240 ml  Output --  Net 240 ml   Filed Weights   10/09/23 1542  Weight: 60.6 kg    Scheduled Meds:  atorvastatin  80 mg Oral q1800   clopidogrel  75 mg Oral Daily   enoxaparin (LOVENOX) injection  40 mg Subcutaneous Q24H   gabapentin  300 mg Oral TID   insulin aspart  0-24 Units Subcutaneous TID WC   mupirocin ointment   Nasal BID   pantoprazole  40 mg Oral BID   Continuous Infusions:  piperacillin-tazobactam (ZOSYN)  IV 3.375 g (10/13/23 0616)    Nutritional status     Body mass index is 24.44 kg/m.  Data Reviewed:   CBC: Recent Labs  Lab 10/09/23 1610 10/10/23 0543  WBC 10.8* 10.1  NEUTROABS 8.7*  --   HGB 6.5* 7.2*  HCT 21.9* 23.5*  MCV 61.0* 64.9*  PLT 466* 374   Basic Metabolic Panel: Recent Labs  Lab 10/09/23 1610 10/10/23 0543  NA 140 140  K 4.9 4.5  CL 105 110  CO2 22 22  GLUCOSE 253* 90  BUN 43* 32*  CREATININE 1.32* 1.01*  CALCIUM 9.1 8.5*   GFR: Estimated Creatinine Clearance: 40 mL/min (A) (by C-G formula based on SCr of 1.01 mg/dL (H)). Liver Function Tests: Recent Labs  Lab 10/09/23 1610 10/10/23 0543  AST 29 15  ALT 24 19  ALKPHOS 89 74  BILITOT 0.6 0.4  PROT 7.4 6.2*  ALBUMIN 2.2* 1.9*   No results for input(s): "LIPASE", "AMYLASE" in the last 168 hours. No results for input(s): "AMMONIA" in the last 168 hours. Coagulation Profile: Recent Labs  Lab 10/09/23 1610  INR 1.2   Cardiac Enzymes: No results for input(s): "CKTOTAL", "CKMB", "CKMBINDEX", "TROPONINI" in the last 168 hours. BNP (last 3 results) No results for input(s): "PROBNP" in the last 8760 hours. HbA1C: No  results for input(s): "HGBA1C" in the last 72 hours. CBG: Recent Labs  Lab 10/12/23 1247 10/12/23 1703 10/12/23 2113 10/13/23 0822 10/13/23 1222  GLUCAP 198* 173* 224* 156* 210*   Lipid Profile: No results for input(s): "CHOL", "HDL", "LDLCALC", "TRIG", "CHOLHDL", "LDLDIRECT" in the last 72 hours. Thyroid Function Tests: No results for input(s): "TSH", "T4TOTAL", "FREET4", "T3FREE", "THYROIDAB" in the last 72 hours. Anemia Panel: No results for input(s): "VITAMINB12", "FOLATE", "FERRITIN", "TIBC", "IRON", "RETICCTPCT" in the last 72 hours. Sepsis Labs: Recent Labs  Lab 10/09/23 1619 10/09/23 1757 10/10/23 0543  LATICACIDVEN 2.5* 2.3* 1.3    Recent Results (from the past 240 hours)  Culture, blood (Routine x 2)     Status: None (Preliminary result)   Collection Time: 10/09/23  3:51 PM   Specimen: BLOOD  Result Value Ref Range Status   Specimen Description BLOOD RIGHT ANTECUBITAL  Final   Special Requests  Final    BOTTLES DRAWN AEROBIC AND ANAEROBIC Blood Culture results may not be optimal due to an inadequate volume of blood received in culture bottles   Culture   Final    NO GROWTH 4 DAYS Performed at [facility], [address], [place], [place] [address]    Report Status PENDING  Incomplete  Resp panel by RT-PCR (RSV, Flu A&B, Covid) Anterior Nasal Swab     Status: None   Collection Time: 10/09/23  3:53 PM   Specimen: Anterior Nasal Swab  Result Value Ref Range Status   SARS Coronavirus 2 by RT PCR NEGATIVE NEGATIVE Final   Influenza A by PCR NEGATIVE NEGATIVE Final   Influenza B by PCR NEGATIVE NEGATIVE Final    Comment: (NOTE) The Xpert Xpress SARS-CoV-2/FLU/RSV plus assay is intended as an aid in the diagnosis of influenza from Nasopharyngeal swab specimens and should not be used as a sole basis for treatment. Nasal washings and aspirates are unacceptable for Xpert Xpress SARS-CoV-2/FLU/RSV testing.  Fact Sheet for  Patients: BloggerCourse.com  Fact Sheet for Healthcare Providers: SeriousBroker.it  This test is not yet approved or cleared by the [place] FDA and has been authorized for detection and/or diagnosis of SARS-CoV-2 by FDA under an Emergency Use Authorization (EUA). This EUA will remain in effect (meaning this test can be used) for the duration of the COVID-19 declaration under Section 564(b)(1) of the Act, 21 U.S.C. section 360bbb-3(b)(1), unless the authorization is terminated or revoked.     Resp Syncytial Virus by PCR NEGATIVE NEGATIVE Final    Comment: (NOTE) Fact Sheet for Patients: BloggerCourse.com  Fact Sheet for Healthcare Providers: SeriousBroker.it  This test is not yet approved or cleared by the [place] FDA and has been authorized for detection and/or diagnosis of SARS-CoV-2 by FDA under an Emergency Use Authorization (EUA). This EUA will remain in effect (meaning this test can be used) for the duration of the COVID-19 declaration under Section 564(b)(1) of the Act, 21 U.S.C. section 360bbb-3(b)(1), unless the authorization is terminated or revoked.  Performed at [facility], [address], [place], [place] [address]   Culture, blood (Routine x 2)     Status: None (Preliminary result)   Collection Time: 10/09/23  4:08 PM   Specimen: BLOOD RIGHT ARM  Result Value Ref Range Status   Specimen Description BLOOD RIGHT ARM  Final   Special Requests   Final    AEROBIC BOTTLE ONLY Blood Culture results may not be optimal due to an inadequate volume of blood received in culture bottles   Culture   Final    NO GROWTH 4 DAYS Performed at [facility] Lab, [address], [place], [place] [address]    Report Status PENDING  Incomplete         Radiology Studies: IR THORACENTESIS ASP PLEURAL SPACE W/IMG GUIDE Result Date: 10/12/2023 INDICATION:  Stage IV lung cancer. Left-sided pleural effusion with evidence of mainstem bronchial occlusion and poor aeration of the left lung. Request for diagnostic and therapeutic thoracentesis. EXAM: ULTRASOUND GUIDED LEFT THORACENTESIS MEDICATIONS: 1% plain lidocaine, 5 mL COMPLICATIONS: None immediate. PROCEDURE: An ultrasound guided thoracentesis was thoroughly discussed with the patient and questions answered. The benefits, risks, alternatives and complications were also discussed. The patient understands and wishes to proceed with the procedure. Written consent was obtained. Ultrasound was performed to localize and mark an adequate pocket of fluid in the left chest. The area was then prepped and draped in  the normal sterile fashion. 1% Lidocaine was used for local anesthesia. Under ultrasound guidance a 6 Fr Safe-T-Centesis catheter was introduced. Thoracentesis was performed. Procedure terminated at this point due to hypotension and patient complaint of chest discomfort. The catheter was removed and a dressing applied. FINDINGS: A total of approximately 900 mL of slightly hazy yellow fluid was removed. Samples were sent to the laboratory as requested by the clinical team. IMPRESSION: Successful ultrasound guided diagnostic and therapeutic LEFT thoracentesis yielding 900 mL of pleural fluid. Procedure performed by Brayton El PA-C and supervised by Dr. Roanna Banning Electronically Signed   By: Roanna Banning M.D.   On: 10/12/2023 11:34   DG Chest 1 View Result Date: 10/12/2023 CLINICAL DATA:  [id number] S/P thoracentesis [id number] EXAM: PORTABLE CHEST 1 VIEW COMPARISON:  CT CAP, 10/09/2023.  Chest XR, 10/09/2023. FINDINGS: Cardiac silhouette is obscured. Aortic arch atherosclerosis. Opacification of the LEFT chest with at least moderate volume pleural effusion suspected. No pneumothorax. RIGHT apical lung mass with adjacent pleural thickening. Additional RIGHT perihilar and basilar interstitial thickening and patchy  opacities. No acute osseous findings. IMPRESSION: 1. Opacification of the LEFT chest without pneumothorax post thoracentesis. Differential diagnosis includes but not limited to, atelectasis, however bronchial obstruction and pulmonary edema can appear similar. 2. RIGHT apical lung mass, better appreciated described on recent CT CAP. 3. Aortic Atherosclerosis (ICD10-I70.0) and Emphysema (ICD10-J43.9). Electronically Signed   By: Roanna Banning M.D.   On: 10/12/2023 11:28   DG Swallowing Func-Speech Pathology Result Date: 10/11/2023 Table formatting from the original result was not included. Modified Barium Swallow Study Patient Details Name: BRENEE GAJDA MRN: [id number] Date of Birth: [birth year] Today's Date: 10/11/2023 HPI/PMH: HPI: MARYETTA SHAFER is a 78 y.o. female with medical history significant of iron deficiency anemia, COPD, lunf cancer, prior CVA, depression, dementia, hypertension who presents emergency department with mental status changes. Patient underwent chest x-ray which showed consolidation of left lower lobe concerning for pneumonia complete opacification of the left hemithorax.  CT chest abdomen pelvis demonstrated complete obstruction of left main bronchus with complete collapse and a large left effusion. BSE 02/23/23 occasional throat clear, difficulty to determine if cough was related to po's or chronic chough. Recommended Dys 3/thin Clinical Impression: Clinical Impression: Pt exhibited a DIGEST score of 2 (moderate pharyngeal impairment) and oral dysphagia marked by slow mastication (endentulous) and trace lingual/floor of mouth residue. Most swallows were initiated at the valleculae and pt demonstrated decreased laryngeal closure resulting in aspiration with thin with ineffective weak throat clear. Pt was unable to accurately execute chin tuck lifting head prior to the swallow. Nectar thick was penetrated but was ejected during the swallow and with a straw pt aspirated nectar with weak throat  clear. Residue was trace with thin and nectar consistencies in vallecuale. Recommend pt continue Dys 3 texture, downgrade liquids to nectar, no straws, pills whole in puree and supervision/assist with meals. SLP called pt's daughter to discuss results and she agrees with plan. Factors that may increase risk of adverse event in presence of aspiration [name] 2021): Factors that may increase risk of adverse event in presence of aspiration [name] 2021): Reduced cognitive function; Weak cough DIGEST Swallow Severity Rating*  Safety:  Efficiency:  Overall Pharyngeal Swallow Severity: 1: mild; 2: moderate; 3: severe; 4: profound *The Dynamic Imaging Grade of Swallowing Toxicity is standardized for the head and neck cancer population, however, demonstrates promising clinical applications across populations to standardize the clinical rating of pharyngeal swallow safety  and severity. Recommendations/Plan: Swallowing Evaluation Recommendations Swallowing Evaluation Recommendations Recommendations: PO diet PO Diet Recommendation: Mildly thick liquids (Level 2, nectar thick); Dysphagia 3 (Mechanical soft) Liquid Administration via: Cup; No straw Medication Administration: Whole meds with puree Supervision: Staff to assist with self-feeding Swallowing strategies  : Slow rate; Small bites/sips Postural changes: Position pt fully upright for meals Oral care recommendations: Oral care BID (2x/day) Treatment Plan Treatment Plan Treatment recommendations: Therapy as outlined in treatment plan below Follow-up recommendations: -- (TBD) Functional status assessment: Patient has had a recent decline in their functional status and demonstrates the ability to make significant improvements in function in a reasonable and predictable amount of time. Treatment frequency: Min 2x/week Treatment duration: 2 weeks Interventions: Diet toleration management by SLP; Patient/family education Recommendations Recommendations for  follow up therapy are one component of a multi-disciplinary discharge planning process, led by the attending physician.  Recommendations may be updated based on patient status, additional functional criteria and insurance authorization. Assessment: Orofacial Exam: Orofacial Exam Oral Cavity: Oral Hygiene: WFL Oral Cavity - Dentition: Edentulous Orofacial Anatomy: Other (comment) Oral Motor/Sensory Function: Suspected cranial nerve impairment CN VII - Facial: Left motor impairment (from prior strokes) Anatomy: Anatomy: Suspected cervical osteophytes Boluses Administered: Boluses Administered Boluses Administered: Thin liquids (Level 0); Mildly thick liquids (Level 2, nectar thick); Moderately thick liquids (Level 3, honey thick); Puree; Solid  Oral Impairment Domain: Oral Impairment Domain Lip Closure: No labial escape Tongue control during bolus hold: Cohesive bolus between tongue to palatal seal Bolus preparation/mastication: Slow prolonged chewing/mashing with complete recollection Bolus transport/lingual motion: Brisk tongue motion Oral residue: Trace residue lining oral structures Location of oral residue : Tongue; Floor of mouth Initiation of pharyngeal swallow : Valleculae  Pharyngeal Impairment Domain: Pharyngeal Impairment Domain Soft palate elevation: No bolus between soft palate (SP)/pharyngeal wall (PW) Laryngeal elevation: Complete superior movement of thyroid cartilage with complete approximation of arytenoids to epiglottic petiole Anterior hyoid excursion: Complete anterior movement Epiglottic movement: Complete inversion Laryngeal vestibule closure: Incomplete, narrow column air/contrast in laryngeal vestibule Pharyngeal stripping wave : Present - complete Pharyngeal contraction (A/P view only): N/A Pharyngoesophageal segment opening: Complete distension and complete duration, no obstruction of flow Tongue base retraction: No contrast between tongue base and posterior pharyngeal wall (PPW) Pharyngeal  residue: Trace residue within or on pharyngeal structures Location of pharyngeal residue: Valleculae  Esophageal Impairment Domain: Esophageal Impairment Domain Esophageal clearance upright position: Complete clearance, esophageal coating Pill: No data recorded Penetration/Aspiration Scale Score: Penetration/Aspiration Scale Score 1.  Material does not enter airway: Solid; Puree; Moderately thick liquids (Level 3, honey thick) 7.  Material enters airway, passes BELOW cords and not ejected out despite cough attempt by patient: Thin liquids (Level 0); Mildly thick liquids (Level 2, nectar thick) Compensatory Strategies: Compensatory Strategies Compensatory strategies: Yes Straw: Ineffective Ineffective Straw: Mildly thick liquid (Level 2, nectar thick) Chin tuck: Ineffective Ineffective Chin Tuck: Thin liquid (Level 0)   General Information: Caregiver present: No  Diet Prior to this Study: Dysphagia 3 (mechanical soft); Thin liquids (Level 0)   Temperature : Normal   Respiratory Status: WFL   Supplemental O2: Nasal cannula   History of Recent Intubation: No  Behavior/Cognition: Alert; Cooperative; Pleasant mood; Requires cueing Self-Feeding Abilities: Able to self-feed (held cup) Baseline vocal quality/speech: Hypophonia/low volume Volitional Cough: Able to elicit Volitional Swallow: Able to elicit Exam Limitations: No limitations Goal Planning: Prognosis for improved oropharyngeal function: Fair Barriers to Reach Goals: Cognitive deficits No data recorded No data recorded Consulted and agree with  results and recommendations: Pt unable/family or caregiver not available; Family member/caregiver Pain: Pain Assessment Pain Assessment: No/denies pain Faces Pain Scale: 0 Breathing: 0 Negative Vocalization: 0 Facial Expression: 0 Body Language: 0 Consolability: 0 PAINAD Score: 0 End of Session: Start Time:SLP Start Time (ACUTE ONLY): 1338 Stop Time: SLP Stop Time (ACUTE ONLY): 1352 Time Calculation:SLP Time Calculation  (min) (ACUTE ONLY): 14 min Charges: SLP Evaluations $ SLP Speech Visit: 1 Visit SLP Evaluations $BSS Swallow: 1 Procedure $MBS Swallow: 1 Procedure SLP visit diagnosis: SLP Visit Diagnosis: Dysphagia, oropharyngeal phase (R13.12) Past Medical History: Past Medical History: Diagnosis Date  Abnormal gait   wheelchair bound  Anemia 06/24/2022  IDA  Arthritis   COPD (chronic obstructive pulmonary disease) (HCC)   COVID-19 virus infection 07/2020  CVA (cerebral vascular accident) (HCC) 09/22/2022  with residual left-sided weakness  Dementia (HCC)   Depression   no current problem  Diabetes mellitus without complication (HCC)   GERD (gastroesophageal reflux disease)   Headache   Hemiparesis affecting left side as late effect of cerebrovascular accident (CVA) (HCC)   w/c bound  HOH (hard of hearing) 01/13/2018  no hearing aids  Hypertension   Mass of lung   bilateral masses  PAD (peripheral artery disease) (HCC)   in CE  Pneumonia   x 4  Pyelonephritis   Seizures (HCC) 09/25/2022  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  Sensorineural hearing loss   bilateral in CE, no hearing aids  Stroke [facility])   Tobacco use disorder 2019  quit 2019  UTI (urinary tract infection)   Wheelchair dependent  Past Surgical History: Past Surgical History: Procedure Laterality Date  ABDOMINAL AORTOGRAM W/LOWER EXTREMITY N/A 08/30/2020  Procedure: ABDOMINAL AORTOGRAM W/LOWER EXTREMITY;  Surgeon: Leonie Douglas, MD;  Location: [facility]ASIVE [facility] LAB;  Service: Cardiovascular;  Laterality: N/A;  ABDOMINAL AORTOGRAM W/LOWER EXTREMITY N/A 09/04/2020  Procedure: ABDOMINAL AORTOGRAM W/LOWER EXTREMITY;  Surgeon: Leonie Douglas, MD;  Location: [facility];  Service: Cardiovascular;  Laterality: N/A;  AMPUTATION Right 03/30/2023  Procedure: RIGHT AMPUTATION ABOVE KNEE;  Surgeon: Victorino Sparrow, MD;  Location[facility];  Service: Vascular;  Laterality: Right;  ESOPHAGOGASTRODUODENOSCOPY (EGD) WITH PROPOFOL N/A 09/16/2020  Procedure:  ESOPHAGOGASTRODUODENOSCOPY (EGD) WITH PROPOFOL;  Surgeon: Shellia Cleverly, DO;  Location: [facility] ENDOSCOPY;  Service: Gastroenterology;  Laterality: N/A;  FINE NEEDLE ASPIRATION  10/27/2022  Procedure: FINE NEEDLE ASPIRATION (FNA) LINEAR;  Surgeon: Josephine Igo, DO;  Location: [facility] ENDOSCOPY;  Service: Pulmonary;;  HEMOSTASIS CLIP PLACEMENT  09/16/2020  Procedure: HEMOSTASIS CLIP PLACEMENT;  Surgeon: Shellia Cleverly, DO;  Location: [facility] ENDOSCOPY;  Service: Gastroenterology;;  HOT HEMOSTASIS N/A 09/16/2020  Procedure: HOT HEMOSTASIS (ARGON PLASMA COAGULATION/BICAP);  Surgeon: Shellia Cleverly, DO;  Location: [facility] ENDOSCOPY;  Service: Gastroenterology;  Laterality: N/A;  PERIPHERAL VASCULAR INTERVENTION Right 08/30/2020  Procedure: PERIPHERAL VASCULAR INTERVENTION;  Surgeon: Leonie Douglas, MD;  Location: [facility];  Service: Cardiovascular;  Laterality: Right;  femoral popliteal  PERIPHERAL VASCULAR INTERVENTION Left 09/04/2020  Procedure: PERIPHERAL VASCULAR INTERVENTION;  Surgeon: Leonie Douglas, MD;  Location: [facility];  Service: Cardiovascular;  Laterality: Left;  SFA  TUBAL LIGATION    VIDEO BRONCHOSCOPY WITH ENDOBRONCHIAL ULTRASOUND Bilateral 10/27/2022  Procedure: VIDEO BRONCHOSCOPY WITH ENDOBRONCHIAL ULTRASOUND;  Surgeon: Josephine Igo, DO;  Location: [facility] ENDOSCOPY;  Service: Pulmonary;  Laterality: Bilateral; [name] 10/11/2023, 3:02 PM          LOS: 4 days   Time spent= 35  mins    Miguel Rota, MD [facility]  If 7PM-7AM, please contact night-coverage  10/13/2023, 1:29 PM

## 2023-10-14 ENCOUNTER — Other Ambulatory Visit (HOSPITAL_COMMUNITY): Payer: Self-pay

## 2023-10-14 DIAGNOSIS — J189 Pneumonia, unspecified organism: Secondary | ICD-10-CM | POA: Diagnosis not present

## 2023-10-14 LAB — CULTURE, BLOOD (ROUTINE X 2)
Culture: NO GROWTH
Culture: NO GROWTH

## 2023-10-14 LAB — BASIC METABOLIC PANEL
Anion gap: 5 (ref 5–15)
BUN: 11 mg/dL (ref 8–23)
CO2: 26 mmol/L (ref 22–32)
Calcium: 8.5 mg/dL — ABNORMAL LOW (ref 8.9–10.3)
Chloride: 104 mmol/L (ref 98–111)
Creatinine, Ser: 0.73 mg/dL (ref 0.44–1.00)
GFR, Estimated: >60 mL/min (ref >60)
Glucose, Bld: 126 mg/dL — ABNORMAL HIGH (ref 70–99)
Potassium: 4.5 mmol/L (ref 3.5–5.1)
Sodium: 135 mmol/L (ref 135–145)

## 2023-10-14 LAB — HEMOGLOBIN AND HEMATOCRIT, BLOOD
HCT: 28.3 % — ABNORMAL LOW (ref 36.0–46.0)
Hemoglobin: 8.7 g/dL — ABNORMAL LOW (ref 12.0–15.0)

## 2023-10-14 LAB — GLUCOSE, CAPILLARY
Glucose-Capillary: 147 mg/dL — ABNORMAL HIGH (ref 70–99)
Glucose-Capillary: 218 mg/dL — ABNORMAL HIGH (ref 70–99)

## 2023-10-14 LAB — CBC
HCT: 23.3 % — ABNORMAL LOW (ref 36.0–46.0)
Hemoglobin: 7 g/dL — ABNORMAL LOW (ref 12.0–15.0)
MCH: 19.6 pg — ABNORMAL LOW (ref 26.0–34.0)
MCHC: 30 g/dL (ref 30.0–36.0)
MCV: 65.1 fL — ABNORMAL LOW (ref 80.0–100.0)
Platelets: 343 10*3/uL (ref 150–400)
RBC: 3.58 MIL/uL — ABNORMAL LOW (ref 3.87–5.11)
RDW: 26.1 % — ABNORMAL HIGH (ref 11.5–15.5)
WBC: 10.2 10*3/uL (ref 4.0–10.5)
nRBC: 0 % (ref 0.0–0.2)

## 2023-10-14 LAB — PREPARE RBC (CROSSMATCH)

## 2023-10-14 MED ORDER — SODIUM CHLORIDE 0.9% IV SOLUTION: Freq: Once | INTRAVENOUS | Status: AC

## 2023-10-14 MED ORDER — IRON SUCROSE 300 MG IVPB - SIMPLE MED
300.0000 mg | Freq: Once | Status: AC
  Administered 2023-10-14: 300 mg via INTRAVENOUS
  Filled 2023-10-14: qty 300

## 2023-10-14 MED ORDER — AMOXICILLIN-POT CLAVULANATE 875-125 MG PO TABS
1.0000 | ORAL_TABLET | Freq: Two times a day (BID) | ORAL | 0 refills | Status: AC
  Filled 2023-10-14: qty 14, 7d supply, fill #0

## 2023-10-14 MED ORDER — AMOXICILLIN-POT CLAVULANATE 875-125 MG PO TABS
1.0000 | ORAL_TABLET | Freq: Two times a day (BID) | ORAL | Status: DC
  Administered 2023-10-14: 1 via ORAL
  Filled 2023-10-14: qty 1

## 2023-10-14 NOTE — Progress Notes (Signed)
 Speech Language Pathology Treatment: Dysphagia  Patient Details Name: Crystal Moore MRN: [id number] DOB: 1946/05/20 Today's Date: 10/14/2023 Time: 1019-1029 SLP Time Calculation (min) (ACUTE ONLY): 10 min  Assessment / Plan / Recommendation Clinical Impression  Pt consumed Dys 3 texture and nectar thick liquids with pt self feeding from cup and taking small sips with timely appearing swallow. There was no cough or throat clear with one subtle and questionable wet vocal quality able to clear with cue to clear throat. Timely mastication with bites graham cracker without residue. Daughter arrived and stated she received nectar thick kit SLP left in room for her. SLP educated how to thicken liquids with Simply Thick and pt voiced understanding. She stated her mom has had a good appetite. MD arrived and plans to discharge pt home today. Recommend continue Dys 3 texture, nectar thick liquids, pills whole in puree. Pt has stage IV lung cancer and has been followed by Palliative care. Per notes daughter declined Hospice services at this time.  If pt is admitted in the future with ST involvement and pt wishes to return to thin liquids, that is an option given diagnosis and can be discussed with family at that time depending on pt's status.    HPI HPI: Crystal Moore is a 78 y.o. female with medical history significant of iron deficiency anemia, COPD, lunf cancer, prior CVA, depression, dementia, hypertension who presents emergency department with mental status changes. Patient underwent chest x-ray which showed consolidation of left lower lobe concerning for pneumonia complete opacification of the left hemithorax.  CT chest abdomen pelvis demonstrated complete obstruction of left main bronchus with complete collapse and a large left effusion. BSE 02/23/23 occasional throat clear, difficulty to determine if cough was related to po's or chronic chough. Recommended Dys 3/thin      SLP Plan  Other  (Comment);Continue with current plan of care (as pt hospitalized, MD planning to d/c today)      Recommendations for follow up therapy are one component of a multi-disciplinary discharge planning process, led by the attending physician.  Recommendations may be updated based on patient status, additional functional criteria and insurance authorization.    Recommendations  Diet recommendations: Dysphagia 3 (mechanical soft);Nectar-thick liquid Liquids provided via: Cup;No straw Medication Administration: Whole meds with puree Supervision: Staff to assist with self feeding Compensations: Slow rate;Small sips/bites Postural Changes and/or Swallow Maneuvers: Seated upright 90 degrees                  Oral care BID   Frequent or constant Supervision/Assistance Dysphagia, oropharyngeal phase (R13.12)     Other (Comment);Continue with current plan of care (as pt hospitalized, MD planning to d/c today)     [name]  10/14/2023, 10:40 AM

## 2023-10-14 NOTE — Discharge Summary (Signed)
 Physician Discharge Summary  Crystal Moore:[id number] DOB: 1946/05/13 DOA: 10/09/2023  PCP: Lorenda Ishihara, MD  Admit date: 10/09/2023 Discharge date: 10/14/2023  Admitted From: Home Disposition: Home  Recommendations for Outpatient Follow-up:  Follow up with PCP in 1-2 weeks Please obtain BMP/CBC in one week your next doctors visit.  7 days of oral Augmentin Recommend outpatient palliative care referral   Discharge Condition: Stable CODE STATUS: DNR Diet recommendation: Dysphagia 3 diet is recommended but I have notified family that they can transition to more comfort feeding depending on patient's preference while understanding the risk of aspirations  Brief Narrative:   78 y.o. female with medical history significant of iron deficiency anemia, COPD, prior CVA, depression, dementia, hypertension and stage IV lung cancer not on active treatment, who presents emergency department with mental status changes.  Patient found to have acute hypoxic respiratory failure secondary to obstructive pneumonia from worsening malignancy.  Patient was initially started on Zosyn and underwent thoracentesis with 900 cc was removed on 3/18.  Although from breathing standpoint she felt better her x-ray still showed persistent opacity on the left side.  Unfortunately this is from her advanced malignancy which family and patient is aware of.  They also met with palliative care, she was transitioned to DNR.  I had extensive discussion with patient's daughter as well and they understand patient overall has poor prognosis.  They would like to take patient home today.  Prior to discharging will give her 1 unit of PRBC transfusion and iron supplements to help improve some quality of life.  I recommend palliative care team to continue follow-up outpatient so when family is ready she can be transition to comfort/hospice.  Assessment & Plan:  Principal Problem:   Postobstructive pneumonia Active  Problems:   Severe sepsis (HCC)   Acute on chronic hypoxic respiratory failure (HCC)   Pleural effusion   AKI (acute kidney injury) (HCC)   Squamous cell carcinoma lung (HCC)   Diabetes mellitus without complication (HCC)   Essential hypertension   Hemiparesis affecting left side as late effect of cerebrovascular accident (CVA) (HCC)   Dementia (HCC)   Acute on chronic hypoxic respiratory failure secondary to postobstructive pneumonia Severe sepsis.   -Scans are showing malignant obstruction of the left main bronchus and complete collapse with large effusion.  No longer on chemotherapy.  Continue supplemental oxygen -On empiric Zosyn > Augmentin for 7 more days.     Pleural effusion, improved but the opacity persist IR performed left-sided thoracentesis, 900 cc removed 3/18 Although breathing is improved, opacity persist given post obstruction pneumonia from her malignancy  Anemia of chronic disease - Baseline hemoglobin 7.5.  Today is 7.0.  Iron indicis showing iron deficiency.  All over and transfuse her 1 unit and give IV iron.  Discussed with her daughter.  No signs of blood loss   AKI (acute kidney injury) (HCC) Creatinine 1.0   Squamous cell carcinoma lung (HCC), stage IV Previous oncology and pulmonary note reviewed.  Hospice has been recommended as patient is not a candidate for any systemic therapy.  Also determined not a candidate for Pleurx catheter   Diabetes mellitus without complication (HCC) CBG within goal -Continue with SSI   Essential hypertension Resume home treatment   Hemiparesis affecting left side as late effect of cerebrovascular accident (CVA) (HCC) Wheelchair-bound but Home Plavix and statin   Dementia (HCC) -Delirium precautions  Unfortunately patient has very poor prognosis.  Strongly recommend comfort care with inpatient hospice   DVT  prophylaxis: Lovenox DNR Family Communication: Daughter at bedside Status is: Inpatient Remains  inpatient appropriate because: Hopefully discharge tomorrow  Subjective: Sitting up feeling much better this morning.  Does not have any complaints Working with speech therapy.   Examination:  General exam: Appears calm and comfortable.  Chronically ill-appearing Respiratory system: Minimal to no breath sounds on the left side. Cardiovascular system: S1 & S2 heard, RRR. No JVD, murmurs, rubs, gallops or clicks. No pedal edema. Gastrointestinal system: Abdomen is nondistended, soft and nontender. No organomegaly or masses felt. Normal bowel sounds heard. Central nervous system: Alert and oriented to name only Extremities: Symmetric 5 x 5 power. Skin: No rashes, lesions or ulcers Psychiatry: Judgement and insight appear poor    Discharge Diagnoses:  Principal Problem:   Postobstructive pneumonia Active Problems:   Severe sepsis (HCC)   Acute on chronic hypoxic respiratory failure (HCC)   Pleural effusion   AKI (acute kidney injury) (HCC)   Squamous cell carcinoma lung (HCC)   Diabetes mellitus without complication (HCC)   Essential hypertension   Hemiparesis affecting left side as late effect of cerebrovascular accident (CVA) (HCC)   Dementia (HCC)      Discharge Exam: Vitals:   10/14/23 0319 10/14/23 0835  BP: 107/70 109/64  Pulse: 85 74  Resp: 17 16  Temp: 98.3 F (36.8 C) 98.5 F (36.9 C)  SpO2: 98% 98%   Vitals:   10/13/23 2303 10/14/23 0000 10/14/23 0319 10/14/23 0835  BP: 98/61 106/65 107/70 109/64  Pulse: 85 88 85 74  Resp: 12 16 17 16   Temp: 98.1 F (36.7 C) 98 F (36.7 C) 98.3 F (36.8 C) 98.5 F (36.9 C)  TempSrc: Oral Axillary Axillary Oral  SpO2: 98% 99% 98% 98%  Weight:      Height:          Discharge Instructions   Allergies as of 10/14/2023       Reactions   Aspirin Other (See Comments)   "Cannot take due to previous GI bleed," per daughter        Medication List     STOP taking these medications    cephALEXin 500 MG  capsule Commonly known as: KEFLEX       TAKE these medications    Accu-Chek Guide test strip Generic drug: glucose blood USE TO test AS DIRECTED TWICE DAILY   Accu-Chek Softclix Lancets lancets 1 each by Other route daily.   acetaminophen 500 MG tablet Commonly known as: TYLENOL Take 1,000 mg by mouth every 6 (six) hours as needed (pain).   albuterol 108 (90 Base) MCG/ACT inhaler Commonly known as: VENTOLIN HFA Inhale 2 puffs into the lungs every 6 (six) hours as needed for wheezing or shortness of breath.   amLODipine 5 MG tablet Commonly known as: NORVASC Take 5 mg by mouth in the morning.   amoxicillin-clavulanate 875-125 MG tablet Commonly known as: AUGMENTIN Take 1 tablet by mouth every 12 (twelve) hours for 7 days.   atorvastatin 80 MG tablet Commonly known as: LIPITOR Take 1 tablet (80 mg total) by mouth daily at 6 PM. What changed: when to take this   clopidogrel 75 MG tablet Commonly known as: PLAVIX Take 1 tablet (75 mg total) by mouth daily.   feeding supplement Liqd Take 237 mLs by mouth 2 (two) times daily between meals.   gabapentin 300 MG capsule Commonly known as: NEURONTIN Take 1 capsule (300 mg total) by mouth 3 (three) times daily. What changed:  when to take  this reasons to take this   insulin glargine 100 UNIT/ML injection Commonly known as: LANTUS Inject 0.1 mLs (10 Units total) into the skin daily. What changed: when to take this   linagliptin 5 MG Tabs tablet Commonly known as: TRADJENTA Take 5 mg by mouth daily.   Mepilex AG 4"X4" Pads Apply 1 each topically daily.   pantoprazole 40 MG tablet Commonly known as: PROTONIX Take 40 mg by mouth 2 (two) times daily.        Follow-up Information     Lorenda Ishihara, MD Follow up.   Specialty: Internal Medicine Contact information: [address] [place] [address] [phone]                Allergies  Allergen Reactions   Aspirin Other  (See Comments)    "Cannot take due to previous GI bleed," per daughter      You were cared for by a hospitalist during your hospital stay. If you have any questions about your discharge medications or the care you received while you were in the hospital after you are discharged, you can call the unit and asked to speak with the hospitalist on call if the hospitalist that took care of you is not available. Once you are discharged, your primary care physician will handle any further medical issues. Please note that no refills for any discharge medications will be authorized once you are discharged, as it is imperative that you return to your primary care physician (or establish a relationship with a primary care physician if you do not have one) for your aftercare needs so that they can reassess your need for medications and monitor your lab values.  You were cared for by a hospitalist during your hospital stay. If you have any questions about your discharge medications or the care you received while you were in the hospital after you are discharged, you can call the unit and asked to speak with the hospitalist on call if the hospitalist that took care of you is not available. Once you are discharged, your primary care physician will handle any further medical issues. Please note that NO REFILLS for any discharge medications will be authorized once you are discharged, as it is imperative that you return to your primary care physician (or establish a relationship with a primary care physician if you do not have one) for your aftercare needs so that they can reassess your need for medications and monitor your lab values.  Please request your Prim.MD to go over all Hospital Tests and Procedure/Radiological results at the follow up, please get all Hospital records sent to your Prim MD by signing hospital release before you go home.  Get CBC, CMP, 2 view Chest X ray checked  by Primary MD during your next visit  or SNF MD in 5-7 days ( we routinely change or add medications that can affect your baseline labs and fluid status, therefore we recommend that you get the mentioned basic workup next visit with your PCP, your PCP may decide not to get them or add new tests based on their clinical decision)  On your next visit with your primary care physician please Get Medicines reviewed and adjusted.  If you experience worsening of your admission symptoms, develop shortness of breath, life threatening emergency, suicidal or homicidal thoughts you must seek medical attention immediately by calling 911 or calling your MD immediately  if symptoms less severe.  You Must read complete instructions/literature along with  all the possible adverse reactions/side effects for all the Medicines you take and that have been prescribed to you. Take any new Medicines after you have completely understood and accpet all the possible adverse reactions/side effects.   Do not drive, operate heavy machinery, perform activities at heights, swimming or participation in water activities or provide baby sitting services if your were admitted for syncope or siezures until you have seen by Primary MD or a Neurologist and advised to do so again.  Do not drive when taking Pain medications.   Procedures/Studies: IR THORACENTESIS ASP PLEURAL SPACE W/IMG GUIDE Result Date: 10/12/2023 INDICATION: Stage IV lung cancer. Left-sided pleural effusion with evidence of mainstem bronchial occlusion and poor aeration of the left lung. Request for diagnostic and therapeutic thoracentesis. EXAM: ULTRASOUND GUIDED LEFT THORACENTESIS MEDICATIONS: 1% plain lidocaine, 5 mL COMPLICATIONS: None immediate. PROCEDURE: An ultrasound guided thoracentesis was thoroughly discussed with the patient and questions answered. The benefits, risks, alternatives and complications were also discussed. The patient understands and wishes to proceed with the procedure. Written  consent was obtained. Ultrasound was performed to localize and mark an adequate pocket of fluid in the left chest. The area was then prepped and draped in the normal sterile fashion. 1% Lidocaine was used for local anesthesia. Under ultrasound guidance a 6 Fr Safe-T-Centesis catheter was introduced. Thoracentesis was performed. Procedure terminated at this point due to hypotension and patient complaint of chest discomfort. The catheter was removed and a dressing applied. FINDINGS: A total of approximately 900 mL of slightly hazy yellow fluid was removed. Samples were sent to the laboratory as requested by the clinical team. IMPRESSION: Successful ultrasound guided diagnostic and therapeutic LEFT thoracentesis yielding 900 mL of pleural fluid. Procedure performed by Brayton El PA-C and supervised by Dr. Roanna Banning Electronically Signed   By: Roanna Banning M.D.   On: 10/12/2023 11:34   DG Chest 1 View Result Date: 10/12/2023 CLINICAL DATA:  [id number] S/P thoracentesis [id number] EXAM: PORTABLE CHEST 1 VIEW COMPARISON:  CT CAP, 10/09/2023.  Chest XR, 10/09/2023. FINDINGS: Cardiac silhouette is obscured. Aortic arch atherosclerosis. Opacification of the LEFT chest with at least moderate volume pleural effusion suspected. No pneumothorax. RIGHT apical lung mass with adjacent pleural thickening. Additional RIGHT perihilar and basilar interstitial thickening and patchy opacities. No acute osseous findings. IMPRESSION: 1. Opacification of the LEFT chest without pneumothorax post thoracentesis. Differential diagnosis includes but not limited to, atelectasis, however bronchial obstruction and pulmonary edema can appear similar. 2. RIGHT apical lung mass, better appreciated described on recent CT CAP. 3. Aortic Atherosclerosis (ICD10-I70.0) and Emphysema (ICD10-J43.9). Electronically Signed   By: Roanna Banning M.D.   On: 10/12/2023 11:28   DG Swallowing Func-Speech Pathology Result Date: 10/11/2023 Table formatting from the  original result was not included. Modified Barium Swallow Study Patient Details Name: Crystal Moore MRN: [id number] Date of Birth: 03-07-1946 Today's Date: 10/11/2023 HPI/PMH: HPI: Crystal Moore is a 78 y.o. female with medical history significant of iron deficiency anemia, COPD, lunf cancer, prior CVA, depression, dementia, hypertension who presents emergency department with mental status changes. Patient underwent chest x-ray which showed consolidation of left lower lobe concerning for pneumonia complete opacification of the left hemithorax.  CT chest abdomen pelvis demonstrated complete obstruction of left main bronchus with complete collapse and a large left effusion. BSE 02/23/23 occasional throat clear, difficulty to determine if cough was related to po's or chronic chough. Recommended Dys 3/thin Clinical Impression: Clinical Impression: Pt exhibited a DIGEST score of 2 (moderate  pharyngeal impairment) and oral dysphagia marked by slow mastication (endentulous) and trace lingual/floor of mouth residue. Most swallows were initiated at the valleculae and pt demonstrated decreased laryngeal closure resulting in aspiration with thin with ineffective weak throat clear. Pt was unable to accurately execute chin tuck lifting head prior to the swallow. Nectar thick was penetrated but was ejected during the swallow and with a straw pt aspirated nectar with weak throat clear. Residue was trace with thin and nectar consistencies in vallecuale. Recommend pt continue Dys 3 texture, downgrade liquids to nectar, no straws, pills whole in puree and supervision/assist with meals. SLP called pt's daughter to discuss results and she agrees with plan. Factors that may increase risk of adverse event in presence of aspiration [name] 2021): Factors that may increase risk of adverse event in presence of aspiration [name] 2021): Reduced cognitive function; Weak cough DIGEST Swallow Severity Rating*  Safety:   Efficiency:  Overall Pharyngeal Swallow Severity: 1: mild; 2: moderate; 3: severe; 4: profound *The Dynamic Imaging Grade of Swallowing Toxicity is standardized for the head and neck cancer population, however, demonstrates promising clinical applications across populations to standardize the clinical rating of pharyngeal swallow safety and severity. Recommendations/Plan: Swallowing Evaluation Recommendations Swallowing Evaluation Recommendations Recommendations: PO diet PO Diet Recommendation: Mildly thick liquids (Level 2, nectar thick); Dysphagia 3 (Mechanical soft) Liquid Administration via: Cup; No straw Medication Administration: Whole meds with puree Supervision: Staff to assist with self-feeding Swallowing strategies  : Slow rate; Small bites/sips Postural changes: Position pt fully upright for meals Oral care recommendations: Oral care BID (2x/day) Treatment Plan Treatment Plan Treatment recommendations: Therapy as outlined in treatment plan below Follow-up recommendations: -- (TBD) Functional status assessment: Patient has had a recent decline in their functional status and demonstrates the ability to make significant improvements in function in a reasonable and predictable amount of time. Treatment frequency: Min 2x/week Treatment duration: 2 weeks Interventions: Diet toleration management by SLP; Patient/family education Recommendations Recommendations for follow up therapy are one component of a multi-disciplinary discharge planning process, led by the attending physician.  Recommendations may be updated based on patient status, additional functional criteria and insurance authorization. Assessment: Orofacial Exam: Orofacial Exam Oral Cavity: Oral Hygiene: WFL Oral Cavity - Dentition: Edentulous Orofacial Anatomy: Other (comment) Oral Motor/Sensory Function: Suspected cranial nerve impairment CN VII - Facial: Left motor impairment (from prior strokes) Anatomy: Anatomy: Suspected cervical osteophytes  Boluses Administered: Boluses Administered Boluses Administered: Thin liquids (Level 0); Mildly thick liquids (Level 2, nectar thick); Moderately thick liquids (Level 3, honey thick); Puree; Solid  Oral Impairment Domain: Oral Impairment Domain Lip Closure: No labial escape Tongue control during bolus hold: Cohesive bolus between tongue to palatal seal Bolus preparation/mastication: Slow prolonged chewing/mashing with complete recollection Bolus transport/lingual motion: Brisk tongue motion Oral residue: Trace residue lining oral structures Location of oral residue : Tongue; Floor of mouth Initiation of pharyngeal swallow : Valleculae  Pharyngeal Impairment Domain: Pharyngeal Impairment Domain Soft palate elevation: No bolus between soft palate (SP)/pharyngeal wall (PW) Laryngeal elevation: Complete superior movement of thyroid cartilage with complete approximation of arytenoids to epiglottic petiole Anterior hyoid excursion: Complete anterior movement Epiglottic movement: Complete inversion Laryngeal vestibule closure: Incomplete, narrow column air/contrast in laryngeal vestibule Pharyngeal stripping wave : Present - complete Pharyngeal contraction (A/P view only): N/A Pharyngoesophageal segment opening: Complete distension and complete duration, no obstruction of flow Tongue base retraction: No contrast between tongue base and posterior pharyngeal wall (PPW) Pharyngeal residue: Trace residue within or on  pharyngeal structures Location of pharyngeal residue: Valleculae  Esophageal Impairment Domain: Esophageal Impairment Domain Esophageal clearance upright position: Complete clearance, esophageal coating Pill: No data recorded Penetration/Aspiration Scale Score: Penetration/Aspiration Scale Score 1.  Material does not enter airway: Solid; Puree; Moderately thick liquids (Level 3, honey thick) 7.  Material enters airway, passes BELOW cords and not ejected out despite cough attempt by patient: Thin liquids (Level 0);  Mildly thick liquids (Level 2, nectar thick) Compensatory Strategies: Compensatory Strategies Compensatory strategies: Yes Straw: Ineffective Ineffective Straw: Mildly thick liquid (Level 2, nectar thick) Chin tuck: Ineffective Ineffective Chin Tuck: Thin liquid (Level 0)   General Information: Caregiver present: No  Diet Prior to this Study: Dysphagia 3 (mechanical soft); Thin liquids (Level 0)   Temperature : Normal   Respiratory Status: WFL   Supplemental O2: Nasal cannula   History of Recent Intubation: No  Behavior/Cognition: Alert; Cooperative; Pleasant mood; Requires cueing Self-Feeding Abilities: Able to self-feed (held cup) Baseline vocal quality/speech: Hypophonia/low volume Volitional Cough: Able to elicit Volitional Swallow: Able to elicit Exam Limitations: No limitations Goal Planning: Prognosis for improved oropharyngeal function: Fair Barriers to Reach Goals: Cognitive deficits No data recorded No data recorded Consulted and agree with results and recommendations: Pt unable/family or caregiver not available; Family member/caregiver Pain: Pain Assessment Pain Assessment: No/denies pain Faces Pain Scale: 0 Breathing: 0 Negative Vocalization: 0 Facial Expression: 0 Body Language: 0 Consolability: 0 PAINAD Score: 0 End of Session: Start Time:SLP Start Time (ACUTE ONLY): 1338 Stop Time: SLP Stop Time (ACUTE ONLY): 1352 Time Calculation:SLP Time Calculation (min) (ACUTE ONLY): 14 min Charges: SLP Evaluations $ SLP Speech Visit: 1 Visit SLP Evaluations $BSS Swallow: 1 Procedure $MBS Swallow: 1 Procedure SLP visit diagnosis: SLP Visit Diagnosis: Dysphagia, oropharyngeal phase (R13.12) Past Medical History: Past Medical History: Diagnosis Date  Abnormal gait   wheelchair bound  Anemia 06/24/2022  IDA  Arthritis   COPD (chronic obstructive pulmonary disease) (HCC)   COVID-19 virus infection 07/2020  CVA (cerebral vascular accident) (HCC) 09/22/2022  with residual left-sided weakness  Dementia (HCC)   Depression    no current problem  Diabetes mellitus without complication (HCC)   GERD (gastroesophageal reflux disease)   Headache   Hemiparesis affecting left side as late effect of cerebrovascular accident (CVA) (HCC)   w/c bound  HOH (hard of hearing) 01/13/2018  no hearing aids  Hypertension   Mass of lung   bilateral masses  PAD (peripheral artery disease) (HCC)   in CE  Pneumonia   x 4  Pyelonephritis   Seizures (HCC) 09/25/2022  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  Sensorineural hearing loss   bilateral in CE, no hearing aids  Stroke [facility])   Tobacco use disorder 2019  quit 2019  UTI (urinary tract infection)   Wheelchair dependent  Past Surgical History: Past Surgical History: Procedure Laterality Date  ABDOMINAL AORTOGRAM W/LOWER EXTREMITY N/A 08/30/2020  Procedure: ABDOMINAL AORTOGRAM W/LOWER EXTREMITY;  Surgeon: Leonie Douglas, MD;  Location: [facility]AS[facility];  Service: Cardiovascular;  Laterality: N/A;  ABDOMINAL AORTOGRAM W/LOWER EXTREMITY N/A 09/04/2020  Procedure: ABDOMINAL AORTOGRAM W/LOWER EXTREMITY;  Surgeon: Leonie Douglas, MD;  Location: [facility] CV [facility];  Service: Cardiovascular;  Laterality: N/A;  AMPUTATION Right 03/30/2023  Procedure: RIGHT AMPUTATION ABOVE KNEE;  Surgeon: Victorino Sparrow, MD;  Location: [facility] OR;  Service: Vascular;  Laterality: Right;  ESOPHAGOGASTRODUODENOSCOPY (EGD) WITH PROPOFOL N/A 09/16/2020  Procedure: ESOPHAGOGASTRODUODENOSCOPY (EGD) WITH PROPOFOL;  Surgeon: Shellia Cleverly, DO;  Location: [facility] ENDOSCOPY;  Service: Gastroenterology;  Laterality: N/A;  FINE NEEDLE ASPIRATION  10/27/2022  Procedure: FINE NEEDLE ASPIRATION (FNA) LINEAR;  Surgeon: Josephine Igo, DO;  Location: [facility] ENDOSCOPY;  Service: Pulmonary;;  HEMOSTASIS CLIP PLACEMENT  09/16/2020  Procedure: HEMOSTASIS CLIP PLACEMENT;  Surgeon: Shellia Cleverly, DO;  Location: [facility] ENDOSCOPY;  Service: Gastroenterology;;  HOT HEMOSTASIS N/A 09/16/2020  Procedure: HOT HEMOSTASIS (ARGON PLASMA  COAGULATION/BICAP);  Surgeon: Shellia Cleverly, DO;  Location: [facility] ENDOSCOPY;  Service: Gastroenterology;  Laterality: N/A;  PERIPHERAL VASCULAR INTERVENTION Right 08/30/2020  Procedure: PERIPHERAL VASCULAR INTERVENTION;  Surgeon: Leonie Douglas, MD;  Location: [facility];  Service: Cardiovascular;  Laterality: Right;  femoral popliteal  PERIPHERAL VASCULAR INTERVENTION Left 09/04/2020  Procedure: PERIPHERAL VASCULAR INTERVENTION;  Surgeon: Leonie Douglas, MD;  Location: [facility];  Service: Cardiovascular;  Laterality: Left;  SFA  TUBAL LIGATION    VIDEO BRONCHOSCOPY WITH ENDOBRONCHIAL ULTRASOUND Bilateral 10/27/2022  Procedure: VIDEO BRONCHOSCOPY WITH ENDOBRONCHIAL ULTRASOUND;  Surgeon: Josephine Igo, DO;  Location: [facility] ENDOSCOPY;  Service: Pulmonary;  Laterality: Bilateral; [name] 10/11/2023, 3:02 PM  CT CHEST ABDOMEN PELVIS W CONTRAST Result Date: 10/09/2023 CLINICAL DATA:  Sepsis, altered mental status. Concern for metastatic disease. History of lung cancer. * Tracking Code: BO * EXAM: CT CHEST, ABDOMEN, AND PELVIS WITH CONTRAST TECHNIQUE: Multidetector CT imaging of the chest, abdomen and pelvis was performed following the standard protocol during bolus administration of intravenous contrast. RADIATION DOSE REDUCTION: This exam was performed according to the departmental dose-optimization program which includes automated exposure control, adjustment of the mA and/or kV according to patient size and/or use of iterative reconstruction technique. CONTRAST:  75mL OMNIPAQUE IOHEXOL 350 MG/ML SOLN COMPARISON:  CT chest 02/22/2023 FINDINGS: CT CHEST FINDINGS Cardiovascular: No acute pulmonary embolism. Aortic acute abnormality. No pericardial fluid. Mediastinum/Nodes: LEFT lung invades the mediastinum and compresses the LEFT mainstem bronchus. This obstruction of the LEFT bronchus is new from prior. Lungs/Pleura: New obstruction of the LEFT mainstem bronchus with complete collapse of  the LEFT upper lobe and complete collapse of LEFT lower lobe. The lingular lobe is aerated. There is a enhancing mass within the collapsed LEFT upper lobe measuring 6.9 cm (image 22/3. Enhancing mass within the LEFT lower lobe measuring 3.7 cm. Large LEFT effusion associated with new bronchial obstruction In the RIGHT lung upper lobe mass measures 5.3 cm increased from 3.8 cm. Musculoskeletal: No aggressive osseous lesion. CT ABDOMEN AND PELVIS FINDINGS Hepatobiliary: No focal hepatic lesion. No biliary ductal dilatation. Gallbladder is normal. Common bile duct is normal. Pancreas: Pancreas is normal. No ductal dilatation. No pancreatic inflammation. Spleen: Normal spleen Adrenals/urinary tract: Adrenal glands and kidneys are normal. The ureters and bladder normal. Stomach/Bowel: Stomach, small bowel, appendix, and cecum are normal. The colon and rectosigmoid colon are normal. Vascular/Lymphatic: Abdominal aorta is normal caliber. There is no retroperitoneal or periportal lymphadenopathy. No pelvic lymphadenopathy. Reproductive: Other: No free fluid. Musculoskeletal: No aggressive osseous lesion. IMPRESSION: CHEST: 1. 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 Large LEFT effusion. 2. Enhancing masses in the LEFT upper lobe, LEFT lower lobe, and RIGHT upper lobe consistent with lung cancer. Masses are increased in size from CT 02/22/2023. 3. Invasion of the mediastinum by the LEFT lung mass. PELVIS: No evidence of metastatic disease in the abdomen pelvis. Electronically Signed   By: Genevive Bi M.D.   On: 10/09/2023 20:02   DG Chest Portable 1 View Result  Date: 10/09/2023 CLINICAL DATA:  Shortness of breath EXAM: PORTABLE CHEST 1 VIEW COMPARISON:  02/24/2023. FINDINGS: Right upper lobe mass again noted, larger than prior study. Complete opacification of the left upper hemithorax. Dense consolidation in the left lower lobe. Suspect  small left pleural effusion. Heart is borderline enlarged. Diffuse interstitial opacities throughout the lungs are similar to prior study. IMPRESSION: Enlarging right upper lobe mass. Dense consolidation in the left lower lobe concerning for pneumonia. Complete opacification of the left upper hemithorax could reflect pneumonia. Cannot exclude postobstructive process or mass. Electronically Signed   By: Charlett Nose M.D.   On: 10/09/2023 17:25     The results of significant diagnostics from this hospitalization (including imaging, microbiology, ancillary and laboratory) are listed below for reference.     Microbiology: Recent Results (from the past 240 hours)  Culture, blood (Routine x 2)     Status: None   Collection Time: 10/09/23  3:51 PM   Specimen: BLOOD  Result Value Ref Range Status   Specimen Description BLOOD RIGHT ANTECUBITAL  Final   Special Requests   Final    BOTTLES DRAWN AEROBIC AND ANAEROBIC Blood Culture results may not be optimal due to an inadequate volume of blood received in culture bottles   Culture   Final    NO GROWTH 5 DAYS Performed at [facility], [address] [place], [place] [address]    Report Status 10/14/2023 FINAL  Final  Resp panel by RT-PCR (RSV, Flu A&B, Covid) Anterior Nasal Swab     Status: None   Collection Time: 10/09/23  3:53 PM   Specimen: Anterior Nasal Swab  Result Value Ref Range Status   SARS Coronavirus 2 by RT PCR NEGATIVE NEGATIVE Final   Influenza A by PCR NEGATIVE NEGATIVE Final   Influenza B by PCR NEGATIVE NEGATIVE Final    Comment: (NOTE) The Xpert Xpress SARS-CoV-2/FLU/RSV plus assay is intended as an aid in the diagnosis of influenza from Nasopharyngeal swab specimens and should not be used as a sole basis for treatment. Nasal washings and aspirates are unacceptable for Xpert Xpress SARS-CoV-2/FLU/RSV testing.  Fact Sheet for Patients: BloggerCourse.com  Fact Sheet for Healthcare  Providers: SeriousBroker.it  This test is not yet approved or cleared by the [place] FDA and has been authorized for detection and/or diagnosis of SARS-CoV-2 by FDA under an Emergency Use Authorization (EUA). This EUA will remain in effect (meaning this test can be used) for the duration of the COVID-19 declaration under Section 564(b)(1) of the Act, 21 U.S.C. section 360bbb-3(b)(1), unless the authorization is terminated or revoked.     Resp Syncytial Virus by PCR NEGATIVE NEGATIVE Final    Comment: (NOTE) Fact Sheet for Patients: BloggerCourse.com  Fact Sheet for Healthcare Providers: SeriousBroker.it  This test is not yet approved or cleared by the [place] FDA and has been authorized for detection and/or diagnosis of SARS-CoV-2 by FDA under an Emergency Use Authorization (EUA). This EUA will remain in effect (meaning this test can be used) for the duration of the COVID-19 declaration under Section 564(b)(1) of the Act, 21 U.S.C. section 360bbb-3(b)(1), unless the authorization is terminated or revoked.  Performed at [facility], [address], [place], [place] [address]   Culture, blood (Routine x 2)     Status: None   Collection Time: 10/09/23  4:08 PM   Specimen: BLOOD RIGHT ARM  Result Value Ref Range Status   Specimen Description BLOOD RIGHT ARM  Final  Special Requests   Final    AEROBIC BOTTLE ONLY Blood Culture results may not be optimal due to an inadequate volume of blood received in culture bottles   Culture   Final    NO GROWTH 5 DAYS Performed at [facility], [address] [place], [place] [address]    Report Status 10/14/2023 FINAL  Final     Labs: BNP (last 3 results) No results for input(s): "BNP" in the last 8760 hours. Basic Metabolic Panel: Recent Labs  Lab 10/09/23 1610 10/10/23 0543 10/14/23 0442  NA 140 140 135  K 4.9 4.5 4.5  CL 105  110 104  CO2 22 22 26   GLUCOSE 253* 90 126*  BUN 43* 32* 11  CREATININE 1.32* 1.01* 0.73  CALCIUM 9.1 8.5* 8.5*   Liver Function Tests: Recent Labs  Lab 10/09/23 1610 10/10/23 0543  AST 29 15  ALT 24 19  ALKPHOS 89 74  BILITOT 0.6 0.4  PROT 7.4 6.2*  ALBUMIN 2.2* 1.9*   No results for input(s): "LIPASE", "AMYLASE" in the last 168 hours. No results for input(s): "AMMONIA" in the last 168 hours. CBC: Recent Labs  Lab 10/09/23 1610 10/10/23 0543 10/14/23 0442  WBC 10.8* 10.1 10.2  NEUTROABS 8.7*  --   --   HGB 6.5* 7.2* 7.0*  HCT 21.9* 23.5* 23.3*  MCV 61.0* 64.9* 65.1*  PLT 466* 374 343   Cardiac Enzymes: No results for input(s): "CKTOTAL", "CKMB", "CKMBINDEX", "TROPONINI" in the last 168 hours. BNP: Invalid input(s): "POCBNP" CBG: Recent Labs  Lab 10/13/23 1222 10/13/23 1727 10/13/23 1813 10/13/23 2105 10/14/23 0833  GLUCAP 210* 65* 90 178* 147*   D-Dimer No results for input(s): "DDIMER" in the last 72 hours. Hgb A1c No results for input(s): "HGBA1C" in the last 72 hours. Lipid Profile No results for input(s): "CHOL", "HDL", "LDLCALC", "TRIG", "CHOLHDL", "LDLDIRECT" in the last 72 hours. Thyroid function studies No results for input(s): "TSH", "T4TOTAL", "T3FREE", "THYROIDAB" in the last 72 hours.  Invalid input(s): "FREET3" Anemia work up No results for input(s): "VITAMINB12", "FOLATE", "FERRITIN", "TIBC", "IRON", "RETICCTPCT" in the last 72 hours. Urinalysis    Component Value Date/Time   COLORURINE YELLOW 10/10/2023 0040   APPEARANCEUR CLEAR 10/10/2023 0040   LABSPEC >1.046 (H) 10/10/2023 0040   PHURINE 5.0 10/10/2023 0040   GLUCOSEU NEGATIVE 10/10/2023 0040   HGBUR NEGATIVE 10/10/2023 0040   BILIRUBINUR NEGATIVE 10/10/2023 0040   KETONESUR NEGATIVE 10/10/2023 0040   PROTEINUR NEGATIVE 10/10/2023 0040   NITRITE NEGATIVE 10/10/2023 0040   LEUKOCYTESUR NEGATIVE 10/10/2023 0040   Sepsis Labs Recent Labs  Lab 10/09/23 1610 10/10/23 0543  10/14/23 0442  WBC 10.8* 10.1 10.2   Microbiology Recent Results (from the past 240 hours)  Culture, blood (Routine x 2)     Status: None   Collection Time: 10/09/23  3:51 PM   Specimen: BLOOD  Result Value Ref Range Status   Specimen Description BLOOD RIGHT ANTECUBITAL  Final   Special Requests   Final    BOTTLES DRAWN AEROBIC AND ANAEROBIC Blood Culture results may not be optimal due to an inadequate volume of blood received in culture bottles   Culture   Final    NO GROWTH 5 DAYS Performed at [facility], [address], [place], [place] [address]    Report Status 10/14/2023 FINAL  Final  Resp panel by RT-PCR (RSV, Flu A&B, Covid) Anterior Nasal Swab     Status: None   Collection Time: 10/09/23  3:53 PM   Specimen: Anterior Nasal Swab  Result Value Ref Range Status   SARS Coronavirus 2 by RT PCR NEGATIVE NEGATIVE Final   Influenza A by PCR NEGATIVE NEGATIVE Final   Influenza B by PCR NEGATIVE NEGATIVE Final    Comment: (NOTE) The Xpert Xpress SARS-CoV-2/FLU/RSV plus assay is intended as an aid in the diagnosis of influenza from Nasopharyngeal swab specimens and should not be used as a sole basis for treatment. Nasal washings and aspirates are unacceptable for Xpert Xpress SARS-CoV-2/FLU/RSV testing.  Fact Sheet for Patients: BloggerCourse.com  Fact Sheet for Healthcare Providers: SeriousBroker.it  This test is not yet approved or cleared by the [place] FDA and has been authorized for detection and/or diagnosis of SARS-CoV-2 by FDA under an Emergency Use Authorization (EUA). This EUA will remain in effect (meaning this test can be used) for the duration of the COVID-19 declaration under Section 564(b)(1) of the Act, 21 U.S.C. section 360bbb-3(b)(1), unless the authorization is terminated or revoked.     Resp Syncytial Virus by PCR NEGATIVE NEGATIVE Final    Comment: (NOTE) Fact Sheet for  Patients: BloggerCourse.com  Fact Sheet for Healthcare Providers: SeriousBroker.it  This test is not yet approved or cleared by the [place] FDA and has been authorized for detection and/or diagnosis of SARS-CoV-2 by FDA under an Emergency Use Authorization (EUA). This EUA will remain in effect (meaning this test can be used) for the duration of the COVID-19 declaration under Section 564(b)(1) of the Act, 21 U.S.C. section 360bbb-3(b)(1), unless the authorization is terminated or revoked.  Performed at [facility], [address], [place], [place] [address]   Culture, blood (Routine x 2)     Status: None   Collection Time: 10/09/23  4:08 PM   Specimen: BLOOD RIGHT ARM  Result Value Ref Range Status   Specimen Description BLOOD RIGHT ARM  Final   Special Requests   Final    AEROBIC BOTTLE ONLY Blood Culture results may not be optimal due to an inadequate volume of blood received in culture bottles   Culture   Final    NO GROWTH 5 DAYS Performed at [facility], [address], [place], [place] [address]    Report Status 10/14/2023 FINAL  Final     Time coordinating discharge:  I have spent 35 minutes face to face with the patient and on the ward discussing the patients care, assessment, plan and disposition with other care givers. >50% of the time was devoted counseling the patient about the risks and benefits of treatment/Discharge disposition and coordinating care.   SIGNED:   Miguel Rota, MD  [facility] 10/14/2023, 11:21 AM   If 7PM-7AM, please contact night-coverage

## 2023-10-14 NOTE — TOC Transition Note (Signed)
 Transition of Care [facility]) - Discharge Note   Patient Details  Name: Crystal Moore MRN: [id number] Date of Birth: 10-04-1945  [facility] CM/SW Contact:  Gordy Clement, RN Phone Number: 10/14/2023, 11:15 AM   Clinical Narrative:    Patient will DC to home today. No recommendations for HH. Patient given some things to focus on for SLP needs per Provder . Hospital bed to be delivered to home later today.[facility] will transport . No additional TOC needs         Patient Goals and CMS Choice            Discharge Placement                       Discharge Plan and Services Additional resources added to the After Visit Summary for                                       Social Drivers of Health (SDOH) Interventions SDOH Screenings   Food Insecurity: No Food Insecurity (10/09/2023)  Housing: Low Risk  (10/09/2023)  Transportation Needs: No Transportation Needs (10/09/2023)  Utilities: Not At Risk (10/09/2023)  Social Connections: Patient Unable To Answer (10/09/2023)  Tobacco Use: Medium Risk (10/09/2023)     Readmission Risk Interventions    03/25/2023    1:00 PM  Readmission Risk Prevention Plan  Transportation Screening Complete  HRI or Home Care Consult Complete  Social Work Consult for Recovery Care Planning/Counseling Complete  Palliative Care Screening Complete  Medication Review [employer]) Complete

## 2023-10-14 NOTE — Progress Notes (Signed)
 Pt is alert and oriented x self. 4 L [place]. Vitals stable. Daughter at bedside. Pt had a big bowel movement before transfer. IV's discontinued. After visit summary and medications given to daughter. Pt transferred via [facility].

## 2023-10-15 LAB — TYPE AND SCREEN
ABO/RH(D): O POS
Antibody Screen: NEGATIVE
Unit division: 0

## 2023-10-15 LAB — BPAM RBC
Blood Product Expiration Date: 202504162359
ISSUE DATE / TIME: 202503201150
Unit Type and Rh: 5100

## 2023-11-25 DEATH — deceased
